# Patient Record
Sex: Male | Born: 1955
Health system: Southern US, Community
[De-identification: ages and names within clinical notes are randomized; demographics above are authoritative.]

## PROBLEM LIST (undated history)

## (undated) DIAGNOSIS — E1142 Type 2 diabetes mellitus with diabetic polyneuropathy: Secondary | ICD-10-CM

## (undated) DIAGNOSIS — K859 Acute pancreatitis without necrosis or infection, unspecified: Secondary | ICD-10-CM

## (undated) DIAGNOSIS — E119 Type 2 diabetes mellitus without complications: Secondary | ICD-10-CM

## (undated) DIAGNOSIS — M1990 Unspecified osteoarthritis, unspecified site: Secondary | ICD-10-CM

## (undated) DIAGNOSIS — F419 Anxiety disorder, unspecified: Secondary | ICD-10-CM

## (undated) DIAGNOSIS — Z8619 Personal history of other infectious and parasitic diseases: Secondary | ICD-10-CM

## (undated) DIAGNOSIS — R519 Headache, unspecified: Secondary | ICD-10-CM

## (undated) DIAGNOSIS — F319 Bipolar disorder, unspecified: Secondary | ICD-10-CM

## (undated) DIAGNOSIS — K219 Gastro-esophageal reflux disease without esophagitis: Secondary | ICD-10-CM

## (undated) DIAGNOSIS — R569 Unspecified convulsions: Secondary | ICD-10-CM

## (undated) DIAGNOSIS — F209 Schizophrenia, unspecified: Secondary | ICD-10-CM

## (undated) DIAGNOSIS — R51 Headache: Secondary | ICD-10-CM

## (undated) HISTORY — PX: KNEE ARTHROSCOPY: SHX127

## (undated) HISTORY — DX: Personal history of other infectious and parasitic diseases: Z86.19

---

## 2003-08-30 ENCOUNTER — Emergency Department (HOSPITAL_COMMUNITY): Admission: EM | Admit: 2003-08-30 | Discharge: 2003-08-30 | Payer: Self-pay | Admitting: Emergency Medicine

## 2004-04-21 ENCOUNTER — Emergency Department (HOSPITAL_COMMUNITY): Admission: EM | Admit: 2004-04-21 | Discharge: 2004-04-21 | Payer: Self-pay | Admitting: Emergency Medicine

## 2005-11-08 ENCOUNTER — Emergency Department (HOSPITAL_COMMUNITY): Admission: EM | Admit: 2005-11-08 | Discharge: 2005-11-08 | Payer: Self-pay | Admitting: Emergency Medicine

## 2008-04-20 ENCOUNTER — Emergency Department (HOSPITAL_COMMUNITY): Admission: EM | Admit: 2008-04-20 | Discharge: 2008-04-20 | Payer: Self-pay | Admitting: Emergency Medicine

## 2008-06-23 ENCOUNTER — Emergency Department (HOSPITAL_COMMUNITY): Admission: EM | Admit: 2008-06-23 | Discharge: 2008-06-23 | Payer: Self-pay | Admitting: Emergency Medicine

## 2008-07-08 ENCOUNTER — Emergency Department (HOSPITAL_COMMUNITY): Admission: EM | Admit: 2008-07-08 | Discharge: 2008-07-08 | Payer: Self-pay | Admitting: Emergency Medicine

## 2009-01-27 ENCOUNTER — Emergency Department (HOSPITAL_COMMUNITY): Admission: EM | Admit: 2009-01-27 | Discharge: 2009-01-27 | Payer: Self-pay | Admitting: Emergency Medicine

## 2009-02-10 ENCOUNTER — Emergency Department (HOSPITAL_COMMUNITY): Admission: EM | Admit: 2009-02-10 | Discharge: 2009-02-10 | Payer: Self-pay | Admitting: Emergency Medicine

## 2009-03-17 ENCOUNTER — Emergency Department (HOSPITAL_COMMUNITY): Admission: EM | Admit: 2009-03-17 | Discharge: 2009-03-17 | Payer: Self-pay | Admitting: Emergency Medicine

## 2010-04-10 LAB — URINALYSIS, ROUTINE W REFLEX MICROSCOPIC
Glucose, UA: NEGATIVE mg/dL
Glucose, UA: NEGATIVE mg/dL
Ketones, ur: 15 mg/dL — AB
Specific Gravity, Urine: 1.025 (ref 1.005–1.030)
Specific Gravity, Urine: 1.03 (ref 1.005–1.030)
pH: 6 (ref 5.0–8.0)
pH: 6 (ref 5.0–8.0)

## 2010-04-10 LAB — CBC
HCT: 45.3 % (ref 39.0–52.0)
Hemoglobin: 15.9 g/dL (ref 13.0–17.0)
MCHC: 35.1 g/dL (ref 30.0–36.0)
MCV: 95.9 fL (ref 78.0–100.0)
Platelets: 170 10*3/uL (ref 150–400)
RBC: 4.72 MIL/uL (ref 4.22–5.81)
RDW: 13.8 % (ref 11.5–15.5)

## 2010-04-10 LAB — LIPASE, BLOOD: Lipase: 488 U/L — ABNORMAL HIGH (ref 11–59)

## 2010-04-10 LAB — DIFFERENTIAL
Basophils Relative: 0 % (ref 0–1)
Eosinophils Relative: 3 % (ref 0–5)
Lymphs Abs: 1.4 10*3/uL (ref 0.7–4.0)
Monocytes Relative: 10 % (ref 3–12)
Neutro Abs: 6.3 10*3/uL (ref 1.7–7.7)
Neutrophils Relative %: 71 % (ref 43–77)

## 2010-04-10 LAB — COMPREHENSIVE METABOLIC PANEL
ALT: 64 U/L — ABNORMAL HIGH (ref 0–53)
AST: 62 U/L — ABNORMAL HIGH (ref 0–37)
Alkaline Phosphatase: 97 U/L (ref 39–117)
Creatinine, Ser: 0.89 mg/dL (ref 0.4–1.5)
GFR calc Af Amer: >60 mL/min (ref >60)
GFR calc non Af Amer: >60 mL/min (ref >60)
Potassium: 4.1 mEq/L (ref 3.5–5.1)

## 2010-04-10 LAB — URINE CULTURE
Colony Count: NO GROWTH
Culture: NO GROWTH

## 2010-04-10 LAB — URINE MICROSCOPIC-ADD ON

## 2010-04-13 LAB — CBC
MCHC: 33.6 g/dL (ref 30.0–36.0)
MCV: 95.6 fL (ref 78.0–100.0)
RBC: 5.1 MIL/uL (ref 4.22–5.81)
RDW: 14.7 % (ref 11.5–15.5)

## 2010-04-13 LAB — DIFFERENTIAL
Basophils Absolute: 0.1 10*3/uL (ref 0.0–0.1)
Basophils Relative: 1 % (ref 0–1)
Monocytes Relative: 9 % (ref 3–12)
Neutro Abs: 8.7 10*3/uL — ABNORMAL HIGH (ref 1.7–7.7)
Neutrophils Relative %: 75 % (ref 43–77)

## 2010-04-13 LAB — COMPREHENSIVE METABOLIC PANEL
ALT: 15 U/L (ref 0–53)
AST: 17 U/L (ref 0–37)
Chloride: 97 mEq/L (ref 96–112)
Creatinine, Ser: 1.19 mg/dL (ref 0.4–1.5)
Potassium: 4.6 mEq/L (ref 3.5–5.1)
Total Protein: 7.6 g/dL (ref 6.0–8.3)

## 2010-05-02 LAB — CBC
HCT: 47.5 % (ref 39.0–52.0)
HCT: 45.2 % (ref 39.0–52.0)
Hemoglobin: 15.8 g/dL (ref 13.0–17.0)
Hemoglobin: 15.3 g/dL (ref 13.0–17.0)
MCHC: 33.9 g/dL (ref 30.0–36.0)
MCV: 92.1 fL (ref 78.0–100.0)
RDW: 17.3 % — ABNORMAL HIGH (ref 11.5–15.5)
WBC: 12.4 10*3/uL — ABNORMAL HIGH (ref 4.0–10.5)

## 2010-05-02 LAB — DIFFERENTIAL
Basophils Absolute: 0.1 10*3/uL (ref 0.0–0.1)
Basophils Relative: 1 % (ref 0–1)
Eosinophils Absolute: 0.6 10*3/uL (ref 0.0–0.7)
Lymphocytes Relative: 9 % — ABNORMAL LOW (ref 12–46)
Lymphocytes Relative: 17 % (ref 12–46)
Lymphs Abs: 1.2 10*3/uL (ref 0.7–4.0)
Lymphs Abs: 1.9 10*3/uL (ref 0.7–4.0)
Monocytes Relative: 3 % (ref 3–12)
Neutro Abs: 10.1 10*3/uL — ABNORMAL HIGH (ref 1.7–7.7)
Neutro Abs: 8.2 10*3/uL — ABNORMAL HIGH (ref 1.7–7.7)
Neutrophils Relative %: 81 % — ABNORMAL HIGH (ref 43–77)
Neutrophils Relative %: 76 % (ref 43–77)

## 2010-05-02 LAB — URINE MICROSCOPIC-ADD ON

## 2010-05-02 LAB — URINALYSIS, ROUTINE W REFLEX MICROSCOPIC
Ketones, ur: 15 mg/dL — AB
Protein, ur: 30 mg/dL — AB
Specific Gravity, Urine: 1.026 (ref 1.005–1.030)

## 2010-05-02 LAB — BASIC METABOLIC PANEL
GFR calc Af Amer: >60 mL/min (ref >60)
GFR calc non Af Amer: >60 mL/min (ref >60)
Sodium: 140 mEq/L (ref 135–145)

## 2010-05-02 LAB — GC/CHLAMYDIA PROBE AMP, GENITAL: Chlamydia, DNA Probe: NEGATIVE

## 2010-05-02 LAB — COMPREHENSIVE METABOLIC PANEL
BUN: 8 mg/dL (ref 6–23)
Calcium: 9.3 mg/dL (ref 8.4–10.5)
Creatinine, Ser: 0.83 mg/dL (ref 0.4–1.5)
Glucose, Bld: 104 mg/dL — ABNORMAL HIGH (ref 70–99)
Sodium: 141 mEq/L (ref 135–145)
Total Protein: 6.7 g/dL (ref 6.0–8.3)

## 2010-05-02 LAB — URINE CULTURE: Colony Count: NO GROWTH

## 2010-11-13 ENCOUNTER — Emergency Department (HOSPITAL_COMMUNITY): Payer: Self-pay

## 2010-11-13 ENCOUNTER — Observation Stay (HOSPITAL_COMMUNITY): Payer: Self-pay

## 2010-11-13 ENCOUNTER — Inpatient Hospital Stay (HOSPITAL_COMMUNITY)
Admission: EM | Admit: 2010-11-13 | Discharge: 2010-11-15 | DRG: 440 | Disposition: A | Discharge status: Home / Self Care | Payer: Self-pay | Attending: Internal Medicine | Admitting: Internal Medicine

## 2010-11-13 ENCOUNTER — Encounter: Payer: Self-pay | Admitting: Internal Medicine

## 2010-11-13 ENCOUNTER — Encounter (HOSPITAL_COMMUNITY): Payer: Self-pay | Admitting: Radiology

## 2010-11-13 DIAGNOSIS — Z8249 Family history of ischemic heart disease and other diseases of the circulatory system: Secondary | ICD-10-CM

## 2010-11-13 DIAGNOSIS — F1010 Alcohol abuse, uncomplicated: Secondary | ICD-10-CM | POA: Diagnosis present

## 2010-11-13 DIAGNOSIS — K869 Disease of pancreas, unspecified: Secondary | ICD-10-CM

## 2010-11-13 DIAGNOSIS — E119 Type 2 diabetes mellitus without complications: Secondary | ICD-10-CM | POA: Diagnosis present

## 2010-11-13 DIAGNOSIS — K8689 Other specified diseases of pancreas: Principal | ICD-10-CM | POA: Diagnosis present

## 2010-11-13 DIAGNOSIS — F1210 Cannabis abuse, uncomplicated: Secondary | ICD-10-CM | POA: Diagnosis present

## 2010-11-13 DIAGNOSIS — I10 Essential (primary) hypertension: Secondary | ICD-10-CM | POA: Diagnosis present

## 2010-11-13 DIAGNOSIS — F17200 Nicotine dependence, unspecified, uncomplicated: Secondary | ICD-10-CM | POA: Diagnosis present

## 2010-11-13 HISTORY — DX: Acute pancreatitis without necrosis or infection, unspecified: K85.90

## 2010-11-13 LAB — GLUCOSE, CAPILLARY
Glucose-Capillary: 84 mg/dL (ref 70–99)
Glucose-Capillary: 122 mg/dL — ABNORMAL HIGH (ref 70–99)

## 2010-11-13 LAB — COMPREHENSIVE METABOLIC PANEL
ALT: 23 U/L (ref 0–53)
Alkaline Phosphatase: 111 U/L (ref 39–117)
BUN: 10 mg/dL (ref 6–23)
CO2: 24 mEq/L (ref 19–32)
GFR calc Af Amer: >90 mL/min (ref >90)
GFR calc non Af Amer: >90 mL/min (ref >90)
Glucose, Bld: 108 mg/dL — ABNORMAL HIGH (ref 70–99)
Potassium: 3.8 mEq/L (ref 3.5–5.1)
Total Protein: 6.9 g/dL (ref 6.0–8.3)

## 2010-11-13 LAB — URINALYSIS, ROUTINE W REFLEX MICROSCOPIC
Bilirubin Urine: NEGATIVE
Hgb urine dipstick: NEGATIVE
Ketones, ur: NEGATIVE mg/dL
Nitrite: NEGATIVE
Protein, ur: NEGATIVE mg/dL
Specific Gravity, Urine: 1.008 (ref 1.005–1.030)
Urobilinogen, UA: 2 mg/dL — ABNORMAL HIGH (ref 0.0–1.0)

## 2010-11-13 LAB — HEMOGLOBIN A1C
Hgb A1c MFr Bld: 6.7 % — ABNORMAL HIGH (ref <5.7)
Mean Plasma Glucose: 146 mg/dL — ABNORMAL HIGH (ref <117)

## 2010-11-13 LAB — RAPID URINE DRUG SCREEN, HOSP PERFORMED
Barbiturates: NOT DETECTED
Cocaine: NOT DETECTED
Tetrahydrocannabinol: POSITIVE — AB

## 2010-11-13 LAB — CBC
HCT: 43.9 % (ref 39.0–52.0)
Hemoglobin: 15.8 g/dL (ref 13.0–17.0)
MCH: 33 pg (ref 26.0–34.0)
MCHC: 36 g/dL (ref 30.0–36.0)
RBC: 4.79 MIL/uL (ref 4.22–5.81)

## 2010-11-13 LAB — DIFFERENTIAL
Basophils Absolute: 0 10*3/uL (ref 0.0–0.1)
Lymphocytes Relative: 28 % (ref 12–46)
Monocytes Absolute: 0.8 10*3/uL (ref 0.1–1.0)
Monocytes Relative: 8 % (ref 3–12)
Neutro Abs: 5.8 10*3/uL (ref 1.7–7.7)
Neutrophils Relative %: 62 % (ref 43–77)

## 2010-11-13 LAB — LIPASE, BLOOD: Lipase: 42 U/L (ref 11–59)

## 2010-11-13 MED ORDER — GADOBENATE DIMEGLUMINE 529 MG/ML IV SOLN
20.0000 mL | Freq: Once | INTRAVENOUS | Status: AC | PRN
  Administered 2010-11-13: 20 mL via INTRAVENOUS

## 2010-11-13 MED ORDER — IOHEXOL 300 MG/ML  SOLN
100.0000 mL | Freq: Once | INTRAMUSCULAR | Status: AC | PRN
  Administered 2010-11-13: 100 mL via INTRAVENOUS

## 2010-11-13 NOTE — H&P (Signed)
Hospital Admission Note Date: 11/13/2010  Patient name: Michael [name] Medical record number: [id number] Date of birth: [birth year] Age: 56 y.o. Gender: male PCP: No primary care physician  Medical Service: General Medicine Teaching Service B2  Attending physician:   Dr. Doneen Poisson  1st Contact:    Dr. Yaakov Guthrie  Pager: [phone] 2nd Contact:    Dr. Dorthula Rue  Pager: [phone] After 5 pm or weekends: 1st Contact:      Pager: [phone] 2nd Contact:      Pager: [phone]  Chief Complaint: Abdominal Pain  History of Present Illness: Michael [name] is a 55 year old gentleman with a history of long-term heavy alcohol use, smoking, and recurrent pancreatitis who presents with abdominal pain starting yesterday.  He describes the pain as a dull ache in his left upper quadrant, and it is currently almost gone.  At its worst, the pain was 5/10 severity.  His "stomach has felt funny, bubbly" for a few days before this pain started.  He has a history of heavy alcohol use.  He had quit drinking for some time before restarting drinking heavily on his birthday on [birth year].  No change in appetite.  No nausea or vomiting since the pain started, but he did vomit 4-5 days ago one time when he drank a lot of alcohol.  No constipation or diarrhea.  No melena or red blood in stools.  No fevers or chills.  He is having frequent night sweats.  He reports a history of multiple episodes of pancreatitis.  His last documented episode was in January 2011.  He reports weight loss between 2010 and 2011 of 25-30 lbs, but he believes he has gained all of this weight back now.   Mr. Friberg reports being homeless for more than a year.  He goes between shelters and has stayed with his niece as well.  He has smoked 0.5-1 ppd cigarettes for 35 years. He admits to smoking marijuana occasionally but no other illicit drug use.  He has some numbness and tingling in his fingers on occasion at baseline.  He reports increased urinary urgency  at baseline, with some episodes of incontinence when he cannot get to a bathroom quickly.  No hematuria or dysuria.  No CP, SOB, headache, or rashes.     Meds: No Home Medications  Allergies: NKDA  Past Medical History  Diagnosis Date  . Diabetes mellitus: per medical record but patient denies.  Does not take any medications or insulin.     . Pancreatitis: Recurrent, last documented episode January 2011    . Smoker: 0.5-1 ppd x 37 years.   Hypertension: Patient reports HTN in the past, on no medications currently Urinary Tract Infection Heavy Alcohol Use for most of his life.  Drinks 2-3 beers plus additional liquor daily currently.  Had stopped drinking for some time but restarted on his birthday earlier this month.    No surgeries   Family History: Mother: Stroke, Cancer Father: Kidney Failure Sister: Diabetes, Hypertension   Social History: Homeless for more than a year.  Goes between shelters.  Recently was living with his niece but not currently.  Had been working at [employer] until quitting a couple weeks ago after an argument with a Radio broadcast assistant.   Heavy alcohol use for most of his life.  Drinks 2-3 beers plus additional liquor daily currently.  Had stopped drinking for some time but restarted on his birthday earlier this month.   Smokes 0.5-1 ppd x 37 years. Occasional  marijuana use, no other illicit drug use. At baseline can walk indefinitely without getting tired.   Review of Systems: See HPI  Physical Exam: Vitals: T 97.9, BP 146/96 -> 129/66, P 100, RR 20, SiO2 98% RA  General: alert, well-developed, and cooperative to examination.  Head: normocephalic and atraumatic.  Eyes: vision grossly intact, pupils equal, pupils round, pupils reactive to light.  Exotropia present (patient reports he has had this his whole life, no double vision in any direction of gaze), mild scleral injection (patient was just crying), anicteric. Mouth: pharynx pink and moist, no erythema,  and no exudates.  Very poor dentition. Neck: supple, full ROM, no thyromegaly, no JVD, and no carotid bruits.  Lungs: normal respiratory effort, no accessory muscle use, normal breath sounds, no crackles, and no wheezes. Heart: normal rate, regular rhythm, no murmur, no gallop, and no rub.   Abdomen: Mildly tender to deep palpation over epigastric area and LUQ.  All other parts of the abdomen non-tender to light and deep palpation.  Soft, non-distended.  Hyperactive bowel sounds.  No guarding, no rebound tenderness, no hepatomegaly.  He points to an area above his LUQ, over his lower ribs, that he says has been tender at times over the past couple days as well.    Msk: no joint swelling, no joint warmth, and no redness over joints.  Pulses: 2+ DP/PT pulses bilaterally Extremities: No cyanosis, clubbing, edema.  L 5th finger tip s/p traumatic amputation as child, nail present.  Neurologic: alert & oriented X3, cranial nerves II-XII intact, strength normal in all extremities, sensation intact to light touch. Skin: turgor normal and no rashes.    Lab results: Basic Metabolic Panel:  Basename 11/13/10 0128  NA 138  K 3.8  CL 102  CO2 24  GLUCOSE 108*  BUN 10  CREATININE 0.67  CALCIUM 8.6  MG --  PHOS --    Liver Function Tests:  Basename 11/13/10 0128  AST 28  ALT 23  ALKPHOS 111  BILITOT 0.6  PROT 6.9  ALBUMIN 3.6     Basename 11/13/10 0128  LIPASE 42  AMYLASE --   CBC:  Basename 11/13/10 0128  WBC 9.4  NEUTROABS 5.8  HGB 15.8  HCT 43.9  MCV 91.6  PLT 162    Urinalysis:  Result Name                               Result     Abnl   Normal Range     Units      Perf. Loc.  Color, Urine                              YELLOW            YELLOW  Appearance                                CLOUDY     a      CLEAR  Specific Gravity                          1.008             1.005-1.030  pH  5.5               5.0-8.0  Urine Glucose                              NEGATIVE          NEG              mg/dL  Bilirubin                                  NEGATIVE          NEG  Ketones                                   NEGATIVE          NEG              mg/dL  Blood                                      NEGATIVE          NEG  Protein                                    NEGATIVE          NEG              mg/dL  Urobilinogen                              2.0        h      0.0-1.0          mg/dL  Nitrite                                    NEGATIVE          NEG  Leukocytes                                NEGATIVE          NEG    MICROSCOPIC NOT DONE ON URINES WITH NEGATIVE PROTEIN, BLOOD, LEUKOCYTES,    NITRITE, OR GLUCOSE <1000 mg/dL.    Imaging results:  Ct Abdomen Pelvis W Contrast  11/13/2010  *RADIOLOGY REPORT*  Clinical Data: Severe lower abdominal pain, nausea and vomiting.  CT ABDOMEN AND PELVIS WITH CONTRAST  Technique:  Multidetector CT imaging of the abdomen and pelvis was performed following the standard protocol during bolus administration of intravenous contrast.  Contrast: OMNIPAQUE IOHEXOL 300 MG/ML IV SOLN  Comparison: Abdominal ultrasound performed 03/17/2009  Findings: The visualized lung bases are clear.  The liver and spleen are unremarkable in appearance.  The gallbladder is mildly distended but otherwise within normal limits.  At the tail of the pancreas, note is made of a vague hypodense lesion measuring approximately 2.3 cm in size.  MRCP is recommended for further evaluation, to exclude malignancy.  Mild prominence of the adrenal glands bilaterally may  reflect mild adrenal hyperplasia.  The kidneys are unremarkable in appearance.  There is no evidence of hydronephrosis.  No renal or ureteral stones are seen.  No perinephric stranding is appreciated.  No free fluid is identified.  The small bowel is unremarkable in appearance.  The stomach is within normal limits.  No acute vascular abnormalities are seen. Calcification  is noted along the distal abdominal aorta and its branches.  The appendix is normal in caliber and contains air, without evidence for appendicitis.  The colon is largely decompressed and is unremarkable in appearance.  The bladder is mildly distended; mild anterior bladder wall thickening is nonspecific and relatively symmetric, though malignancy cannot be entirely excluded.  Suggest clinical correlation.  A tiny left inguinal hernia is noted, containing only fat.  Minimal calcification is noted within the prostate.  No inguinal lymphadenopathy is seen.  No acute osseous abnormalities are identified.  IMPRESSION:  1.  2.3 cm vague hypodense lesion noted at the tail of the pancreas.  MRCP is recommended for further evaluation, to exclude malignancy.  2.  Mild bilateral adrenal gland prominence may reflect mild adrenal hyperplasia. 3.  Tiny left inguinal hernia, containing only fat. 4.  Mild anterior bladder wall thickening is nonspecific and relatively symmetric, though malignancy cannot be entirely excluded.  Suggest clinical correlation. 5.  Scattered calcification along the distal abdominal aorta and its branches.  Original Report Authenticated By: Tonia Ghent, M.D.    Assessment & Plan by Problem:  Mr. Hence is a 55 year old gentleman with a history of long-term heavy alcohol use, smoking, and recurrent pancreatitis who restarted heavy drinking 2-3 weeks ago and now presents with abdominal pain for one day.  Abdominal Pain: The most likely etiology of his abdominal pain is gastritis, likely due to his relapse into heavy alcohol use.  GERD and PUD would also be on the differential.  He gives a history of additional pain located slightly higher, over his ribs, with associated tenderness, that may have been musculoskeletal.  Normal lipase makes this less likely to be another episode of pancreatitis.  A left lower lobe pneumonia is another possibility.  Finally, the small 2.3 cm hypodense lesion in the tail  of the pancreas seen on CT makes pancreatic neoplasm another possible etiology.    Admit for observation Zofran PRN nausea, Morphine PRN pain for symptomatic relief MRCP to further evaluate pancreatic lesion CXRay Protonix NS @ 125 cc/hr, NPO for now AM Labs: CBC and CMET   Pancreatic Mass: 2.3cm hypodense lesion in tail of the pancreas seen on CT.  Highest on differential for this are pseudocyst (given history of recurrent pancreatitis) vs. neoplasm.  MRCP ordered for further evaluation  Possible h/o DM: Patient denies history of diabetes or insulin use at home, but EPIC EMR has diabetes listed in his history.  Cannot find any further records of this.  Glucose 108 on CMET.  Will check HbA1c  Possible HTN: Patient reports hypertension in the past.  BP a little elevated on arrival, but normalized in ED without any medication for pressure.  Denies taking any medications at home.  Will continue to monitor.  Tobacco Use: Tobacco Cessation consult  Heavy Alcohol Use: CIWA withdrawal protocol  Marijuana Use: Will check UDS  No Outpatient Care:  Will arrange for follow-up with a primary care physician on discharge.  DVT Proph: Lovenox    R2/3______________________________      R1________________________________  ATTENDING: I performed and/or observed a history and physical examination of  the patient.  I discussed the case with the residents as noted and reviewed the residents' notes.  I agree with the findings and plan--please refer to the attending physician note for more details.  Signature________________________________  Printed Name_____________________________

## 2010-11-14 DIAGNOSIS — R109 Unspecified abdominal pain: Secondary | ICD-10-CM

## 2010-11-14 LAB — COMPREHENSIVE METABOLIC PANEL
ALT: 17 U/L (ref 0–53)
AST: 21 U/L (ref 0–37)
CO2: 28 mEq/L (ref 19–32)
Chloride: 105 mEq/L (ref 96–112)
GFR calc non Af Amer: >90 mL/min (ref >90)
Potassium: 3.7 mEq/L (ref 3.5–5.1)
Total Bilirubin: 0.8 mg/dL (ref 0.3–1.2)

## 2010-11-14 LAB — CBC
Hemoglobin: 14.3 g/dL (ref 13.0–17.0)
MCH: 32.5 pg (ref 26.0–34.0)
MCHC: 34.8 g/dL (ref 30.0–36.0)
Platelets: 139 10*3/uL — ABNORMAL LOW (ref 150–400)

## 2010-11-14 LAB — GLUCOSE, CAPILLARY: Glucose-Capillary: 115 mg/dL — ABNORMAL HIGH (ref 70–99)

## 2010-11-26 NOTE — Discharge Summary (Signed)
NAMETARRANCE, Michael [name]                ACCOUNT NO.:  [id number]  MEDICAL RECORD NO.:  [id number]  LOCATION:                                 FACILITY:  PHYSICIAN:  Doneen Poisson, MD     DATE OF BIRTH:  01-07-1956  DATE OF ADMISSION:  11/13/2010 DATE OF DISCHARGE:  11/15/2010                              DISCHARGE SUMMARY   DISCHARGE DIAGNOSES: 1. Abdominal pain:  Muscle skeletal versus gastritis. 2. Pancreatic lesion. 3. Hypertension. 4. Diabetes mellitus. 5. History of heavy alcohol use. 6. History of recurrent pancreatitis. 7. Tobacco use. 8. Marijuana use. 9. Urinary urgency. 10.Possible hepatic steatosis: on CT, possibly related to heavy alcohol use.  DISCHARGE MEDICATIONS: 1. Folic acid 1 mg by mouth daily. 2. Omeprazole 20 mg by mouth daily. 3. Thiamien 100 mg by mouth daily.  DISPOSITION:  Michael [name] is to follow up with the Internal Medicine Outpatient Clinic at [facility] on November 28, 2010.  He currently does not have an outpatient doctor, so this will be a visit to establish care.  He was found to be hypertensive during this hospitalization and gives a history of more severe hypertension in the past.  No antihypertensive medications were started during this hospitalization, as his pressures were not that high, and it was uncertain whether the higher blood pressures we observed were  partially due to pain.  His blood pressure should be addressed at  follo-wup.  He was also found to have diabetes mellitus with a  hemoglobin A1c of 6.7%.  We discussed the importance of diet and this should be reiterated on follow-up.  In addition, he was found to have a 2.3-cm hypodense lesion in the tail of the pancreas on CT scan, and an MRCP was ordered, which characterized the lesion  as a likely dilated pancreatic duct with the cause of dilation uncertain.   Most likely, this is a benign stricture from his history of recurrent  pancreatitis, but an early neoplasm  cannot be ruled out.  It was felt  that he would best benefit from an endoscopic ultrasound with possible biopsy of this lesion, but the gastroenterologist, Dr. Dulce Sellar, who specializes in this procedure was out of town during Michael [name] hospitalization.  As a result, an appointment was scheduled with Dr. Dulce Sellar on December 05, 2010.  However, due to his lack of insurance,  he has to call and discuss his financial situation with the office or  else they will cancel this appointment.  It was felt that he would  likely qualify for the orange card to the [facility] through the outpatient clinic.  It is likely he will have to  postpone his appointment with Gastroenterology for short time until  he can get this assistance through the outpatient clinic.  CONSULTATIONS:  None.  PROCEDURES: 1. CT abdomen and pelvis with contrast, November 13, 2010:  2.3-cm     vague hypodense lesion noted in the tail of the pancreas.  Mild     bilateral adrenal gland prominence may reflect mild adrenal     hyperplasia.  Tiny left inguinal hernia containing only fat.  Mild  anterior bladder wall thickening is nonspecific and relatively     symmetric, though malignancy cannot be entirely excluded     suggest clinical correlation.  Scattered calcifications along the     distal abdominal aorta and its branches. 2. MRCP with and without contrast, November 13, 2010:  The area of     hypoattenuation within the pancreatic tail described on CT     corresponds to an area of focal pancreatic ductal dilation within     the tail with abrupt transition to normal-caliber duct within the     junction of the body and tail.  Given history of pancreatitis, this     could represent a benign stricture.  However, a non-border-     deforming early/subtle adenocarcinoma could have a similar     appearance.  No evidence of metastatic disease or acute process in     the abdomen.  Probable focal steatosis  adjacent to gallbladder     within the liver. 3. Chest x-ray, 2 view, November 13, 2010:  Normal chest x-ray.  ADMISSION HISTORY AND PHYSICAL:  Michael [name] is a 55 year old gentleman with a history of long-term heavy alcohol use, smoking, and recurrent pancreatitis who presents with abdominal pain starting yesterday.  He describes the pain as a dull ache in his left upper quadrant and is currently almost gone.  At its worst, the pain was 5/10 severity.  The stomach has felt funny, bubbly for a few days before this pain started. He has a history of heavy alcohol use.  He quit drinking for some time before restarting drinking heavily on his birthday on November 01, 2008. No change in appetite.  No nausea or vomiting since the pain started, but he did vomit 4-5 days ago 1 time when he drank a lot of alcohol.  No constipation or diarrhea.  No melena or red blood in stools.  No fevers or chills.  He is having frequent night sweats.  He reports a history of multiple episodes of pancreatitis.  His last documented episode was in January 2011.  He reports weight loss between 2010-2011 of 25-30 pounds, but he believes he has gained all this weight back now.  Michael [name] reports being homeless for more than a year.  He goes between shelters and staying with his niece.  He smoked between 1/2 to 1 pack of cigarettes per day for 35 years.  He also admits to smoking marijuana occasionally, but denies any other illicit drug use.  He has some numbness and tingling in his fingers on occasion at baseline.  He reports increased urinary urgency at baseline with some episodes of incontinence when he cannot get to the bathroom quickly.  No hematuria or dysuria.  No chest pain, shortness of breath, headache or rashes.  ADMISSION PHYSICAL EXAMINATION: VITAL SIGNS:  Temperature 97.9, blood pressure 146/96 to 129/66, pulse  100, respirations 20, O2 saturation 98% on room air. GENERAL:  Alert, well developed, and  cooperative to exam. HEAD:  Normocephalic, atraumatic. EYES:  Vision grossly intact.  Pupils equally round, reactive to light. Exotropia present, which he reports he has had his whole life. Denies double vision in any direction of gaze.  Mild scleral injection (he was just crying).  Anicteric. MOUTH:  Pharynx pink and moist.  No erythema.  No exudates.  Very poor dentition. NECK:  Supple.  Full range of motion.  No thyromegaly, no JVD, and no carotid bruits. LUNGS:  Normal respiratory effort.  No accessory muscle use.  Normal breath sounds.  No crackles and no wheezes. HEART:  Normal rate, regular rhythm with no murmurs, rubs, or gallops. ABDOMEN:  Mildly tender to deep palpation over epigastric area and left upper quadrant.  All other parts of the abdomen nontender to light and deep palpation.  Soft, nondistended.  Hyperactive bowel sounds.  No guarding, no rebound tenderness, and no hepatomegaly.  He points to an area above his left upper quadrant over his lower ribs that has been tender in recent days, but is not currently. MSK:  No joint swelling, warmth, or redness. PULSES:  2+ dorsalis pedis and posterior tibial pulses bilaterally. EXTREMITIES:  No cyanosis, clubbing, or edema.  Left fifth finger tip status post traumatic amputation as a child, nail present. NEUROLOGIC:  Alert and oriented x 3.  Cranial nerves II through XII intact.  Strength normal in all extremities.  Sensation intact to light touch. SKIN:  Turgor normal and no rashes.  ADMISSION LABORATORY RESULTS:  BMET:  Sodium 138, potassium 3.8, chloride 102, CO2 24, glucose 108, BUN 10, creatinine 0.67, calcium 8.6.  Liver function tests:  AST 28, ALT 23, alkaline phosphatase 111, total bilirubin 0.6, protein 6.9, albumin 3.6.  Lipase 42.  CBC:  White blood cell count 9.4, absolute neutrophils 5.8, hemoglobin 15.8, hematocrit 43.9, MCV 91.6, platelets 162.  Urinalysis:  Negative.  HOSPITAL COURSE BY  PROBLEM: 1. Abdominal pain.  The most likely etiology of the abdominal pain was     thought to be gastritis, likely due to his recent relapse and heavy     alcohol use.  He did report increased pain after drinking     a lot of liquor.  Gastroesophageal reflux disease as well as peptic     ulcer disease were also on the differential, but less likely.     It is also feasible that the small pancreatic finding on imaging     could be the cause, but it is more likely that this is due to     gastritis from his recent relapse and alcohol use.  Given the normal     lipase level, this is unlikely to be another episode of     pancreatitis.  Normal chest x-ray makes left lower lobe pneumonia     very unlikely.  His abdominal pain resolved very quickly     during the hospitalization.  He was offered Zofran and     morphine as needed during hospitalization, but did not require any     of these medications from after the time of admission through     discharge.  He was started on Protonix on admission  and the      abdominal pain quickly resolved.  2. Pancreatic mass:  2.3 cm hypodense lesion in the tail of pancreas     seen on CT.  On follow-up MRCP, this was found to be a dilated duct     with the cause being most likely a benign  stricture given his      history of recurrent pancreatitis, but the possibility of an early      developing pancreatic neoplasm could not be ruled out.  A CA-19-9     antigen was checked which was 36.8, borderline elevated.  It was      felt that this finding made an early developing neoplasm less likely.     It was felt that the most appropriate next test would be a GI      consultation for  possible endoscopic ultrasound with ultrasound-guided      biopsy of the mass.  However, the GI specialist in town who does this      procedure, Dr. Dulce Sellar, was out of town during Michael [name]      hospitalization.  As a result, a follow-up appointment was made with      Dr. Dulce Sellar for  Michael [name] to be evaluated, and possibly have this     procedure done, as an outpatient.  3. Hypertension:  Michael [name] blood pressure on admission was     146/96, and blood pressures during hospitalization ranged between     121 and 145 systolic over 76-84 diastolic.  Given the mildness of     these blood pressures, no antihypertensive medications were started     during this hospitalization.  He reports a history of     higher blood pressures in the past.  His blood pressure should be     addressed on follow-up with the outpatient clinic.  4. Diabetes mellitus:  Michael [name] hemoglobin A1c was found to be     6.7% during this hospitalization.  This qualifies him for a diagnosis     of diabetes mellitus.  At this level, pharmacotherapy would not     be indicated.  We stressed the importance of good diet and this      should be further discussed on follow-up in the outpatient clinic.  5. Heavy alcohol use.  Michael [name] has a history of heavy alcohol use     and has recently relapsed into heavy alcohol use after managing to     stop for some time.  This is the likely cause of his history of      recurrent pancreatitis.  It also has caused him to have a      significant loss of social function.  He reported to [place] that     he has had recently lost his job due to his relapse into     alcoholism, and he knows that his heavy alcohol use is the cause of     his homelessness.  The social worker met with him during this     hospitalization to discuss alcohol cessation, and this should be     readdressed at follow-up.  6. Tobacco use:  The social worker met with him during the      hospitalization to discuss his tobacco use and options to quit.     He expressed a desire to quit.  His progress should be reassessed      at follow-up.  DISCHARGE VITAL SIGNS:  Temperature 98.3, pulse 78, respirations 16, blood pressure 130/87, O2 saturation 97% on room air.  DISCHARGE LABORATORY RESULTS:  Cancer  antigen 19/9 36.8.  CBC:  White blood cell count 7.4, hemoglobin 14.3, hematocrit 41.1,  platelets 139.  Comprehensive metabolic panel:  Sodium 141, potassium 3.7, chloride 105, bicarbonate 28, glucose 103, BUN 17, creatinine 0.99.  Total bilirubin 0.8, alkaline phosphatase 100, AST 21, ALT 17, total protein 5.9, albumin 2.9, calcium 8.6.  Hemoglobin A1c 6.7%.  Urine drug screen positive for marijuana (he admitted to marijuana use  before we had ordered this drug screen).   ______________________________ Blanca Friend, MD   ______________________________ Doneen Poisson, MD   BW/MEDQ  D:  11/19/2010  T:  11/20/2010  Job:  [id number]  Electronically Signed by Blanca Friend MD on 11/20/2010 10:07:33 PM Electronically Signed by Doneen Poisson  on 11/26/2010 05:23:09 PM

## 2010-11-28 ENCOUNTER — Encounter (HOSPITAL_COMMUNITY): Payer: Self-pay | Admitting: *Deleted

## 2010-11-28 ENCOUNTER — Ambulatory Visit: Payer: Self-pay | Admitting: Internal Medicine

## 2010-11-29 ENCOUNTER — Ambulatory Visit: Payer: Self-pay | Admitting: Internal Medicine

## 2010-11-30 ENCOUNTER — Ambulatory Visit (HOSPITAL_COMMUNITY): Admit: 2010-11-30 | Payer: Self-pay | Admitting: Gastroenterology

## 2010-11-30 ENCOUNTER — Encounter (HOSPITAL_COMMUNITY): Payer: Self-pay

## 2010-11-30 SURGERY — ESOPHAGEAL ENDOSCOPIC ULTRASOUND (EUS) RADIAL
Anesthesia: Monitor Anesthesia Care

## 2010-12-01 ENCOUNTER — Encounter: Payer: Self-pay | Admitting: Internal Medicine

## 2010-12-02 ENCOUNTER — Emergency Department (HOSPITAL_COMMUNITY)
Admission: EM | Admit: 2010-12-02 | Discharge: 2010-12-02 | Disposition: A | Discharge status: Home / Self Care | Payer: Self-pay | Attending: Emergency Medicine | Admitting: Emergency Medicine

## 2010-12-02 ENCOUNTER — Encounter (HOSPITAL_COMMUNITY): Payer: Self-pay | Admitting: *Deleted

## 2010-12-02 DIAGNOSIS — E119 Type 2 diabetes mellitus without complications: Secondary | ICD-10-CM | POA: Insufficient documentation

## 2010-12-02 DIAGNOSIS — F17200 Nicotine dependence, unspecified, uncomplicated: Secondary | ICD-10-CM | POA: Insufficient documentation

## 2010-12-02 DIAGNOSIS — R10817 Generalized abdominal tenderness: Secondary | ICD-10-CM | POA: Insufficient documentation

## 2010-12-02 DIAGNOSIS — I10 Essential (primary) hypertension: Secondary | ICD-10-CM | POA: Insufficient documentation

## 2010-12-02 DIAGNOSIS — R109 Unspecified abdominal pain: Secondary | ICD-10-CM | POA: Insufficient documentation

## 2010-12-02 DIAGNOSIS — R112 Nausea with vomiting, unspecified: Secondary | ICD-10-CM | POA: Insufficient documentation

## 2010-12-02 DIAGNOSIS — R000 Tachycardia, unspecified: Secondary | ICD-10-CM | POA: Insufficient documentation

## 2010-12-02 DIAGNOSIS — K859 Acute pancreatitis without necrosis or infection, unspecified: Secondary | ICD-10-CM | POA: Insufficient documentation

## 2010-12-02 LAB — COMPREHENSIVE METABOLIC PANEL
ALT: 38 U/L (ref 0–53)
AST: 35 U/L (ref 0–37)
Albumin: 3.5 g/dL (ref 3.5–5.2)
Alkaline Phosphatase: 125 U/L — ABNORMAL HIGH (ref 39–117)
BUN: 9 mg/dL (ref 6–23)
CO2: 26 mEq/L (ref 19–32)
Calcium: 9.5 mg/dL (ref 8.4–10.5)
Chloride: 98 mEq/L (ref 96–112)
Creatinine, Ser: 0.78 mg/dL (ref 0.50–1.35)
GFR calc Af Amer: >90 mL/min (ref >90)
GFR calc non Af Amer: >90 mL/min (ref >90)
Glucose, Bld: 98 mg/dL (ref 70–99)
Potassium: 4.1 mEq/L (ref 3.5–5.1)
Sodium: 140 mEq/L (ref 135–145)
Total Bilirubin: 0.9 mg/dL (ref 0.3–1.2)
Total Protein: 7.9 g/dL (ref 6.0–8.3)

## 2010-12-02 LAB — CBC
HCT: 48.9 % (ref 39.0–52.0)
Hemoglobin: 17.3 g/dL — ABNORMAL HIGH (ref 13.0–17.0)
MCH: 32.9 pg (ref 26.0–34.0)
MCHC: 35.4 g/dL (ref 30.0–36.0)
MCV: 93 fL (ref 78.0–100.0)
Platelets: 141 10*3/uL — ABNORMAL LOW (ref 150–400)
RBC: 5.26 MIL/uL (ref 4.22–5.81)
RDW: 13.6 % (ref 11.5–15.5)
WBC: 10.2 10*3/uL (ref 4.0–10.5)

## 2010-12-02 LAB — URINE MICROSCOPIC-ADD ON

## 2010-12-02 LAB — DIFFERENTIAL
Basophils Relative: 0 % (ref 0–1)
Eosinophils Absolute: 0 10*3/uL (ref 0.0–0.7)
Eosinophils Relative: 0 % (ref 0–5)
Monocytes Relative: 9 % (ref 3–12)
Neutrophils Relative %: 79 % — ABNORMAL HIGH (ref 43–77)

## 2010-12-02 LAB — URINALYSIS, ROUTINE W REFLEX MICROSCOPIC
Leukocytes, UA: NEGATIVE
Nitrite: NEGATIVE
Specific Gravity, Urine: 1.011 (ref 1.005–1.030)
Urobilinogen, UA: 1 mg/dL (ref 0.0–1.0)
pH: 6 (ref 5.0–8.0)

## 2010-12-02 LAB — LIPASE, BLOOD: Lipase: 141 U/L — ABNORMAL HIGH (ref 11–59)

## 2010-12-02 MED ORDER — HYDROCODONE-ACETAMINOPHEN 5-325 MG PO TABS: ORAL_TABLET | ORAL | Status: DC

## 2010-12-02 MED ORDER — ONDANSETRON HCL 4 MG PO TABS: 4.0000 mg | ORAL_TABLET | Freq: Four times a day (QID) | ORAL | Status: DC

## 2010-12-02 MED ORDER — ONDANSETRON HCL 4 MG/2ML IJ SOLN
4.0000 mg | Freq: Once | INTRAMUSCULAR | Status: AC
  Administered 2010-12-02: 4 mg via INTRAVENOUS
  Filled 2010-12-02: qty 2

## 2010-12-02 MED ORDER — MORPHINE SULFATE 4 MG/ML IJ SOLN
4.0000 mg | Freq: Once | INTRAMUSCULAR | Status: AC
  Administered 2010-12-02: 4 mg via INTRAVENOUS
  Filled 2010-12-02: qty 1

## 2010-12-02 NOTE — ED Notes (Signed)
Iv team here 

## 2010-12-02 NOTE — ED Notes (Signed)
The pt has had abd pain for 2 weeks intermittently.  History of pancreatitis.

## 2010-12-02 NOTE — ED Provider Notes (Signed)
History     CSN: [id number] Arrival date & time: 12/02/2010  1:15 PM   None     Chief Complaint  Patient presents with  . Abdominal Pain  . Nausea    (Consider location/radiation/quality/duration/timing/severity/associated sxs/prior treatment) HPI  Patient presents to emergency department complaining a waxing and waning diffuse abdominal pain stating that since being admitted to the hospital at the end of October with diagnosis of pancreatitis that he has had on and off abdominal pain with associated nausea and a couple episodes of vomiting. Patient has history of recurrent pancreatitis and history of alcohol abuse. Patient states since discharge from the hospital he has had only 2 beers. Patient states that the nausea and episodes of vomiting followed to 2 beers. Patient was set up to see outpatient clinic and Dr. Dulce Sellar for further evaluation and management of a pancreatic lesion however he did not follow up with outpatient clinics and has the appointment with Dr. Dulce Sellar in 3 days. Patient denies changing in the abdominal pain but states persistent ongoing pain. Patient states the pain is similar to the pain that he's had since admission. Patient denies known fevers. Denies alleviating factors. Has not taken anything for pain prior to arrival. Denies radiation of pain.   Past Medical History  Diagnosis Date  . Pancreatitis   . Smoker   . Diabetes mellitus     diet controlled    Past Surgical History  Procedure Date  . No past surgeries     No family history on file.  History  Substance Use Topics  . Smoking status: Current Everyday Smoker  . Smokeless tobacco: Not on file  . Alcohol Use: Yes      Review of Systems  All other systems reviewed and are negative.    Allergies  Review of patient's allergies indicates no known allergies.  Home Medications   Current Outpatient Rx  Name Route Sig Dispense Refill  . IBUPROFEN 400 MG PO TABS Oral Take 800 mg by mouth  every 4 (four) hours as needed.        BP 158/96  Pulse 109  Temp(Src) 99.3 F (37.4 C) (Oral)  Resp 22  Ht 5\' 5"  (1.651 m)  Wt 200 lb (90.719 kg)  BMI 33.28 kg/m2  SpO2 98%  Physical Exam  Nursing note and vitals reviewed. Constitutional: He is oriented to person, place, and time. He appears well-developed and well-nourished. No distress.  HENT:  Head: Normocephalic and atraumatic.  Eyes: Conjunctivae and EOM are normal. Pupils are equal, round, and reactive to light.  Neck: Normal range of motion. Neck supple.  Cardiovascular: Regular rhythm, S1 normal, S2 normal, normal heart sounds and intact distal pulses.  Tachycardia present.  Exam reveals no gallop and no friction rub.   No murmur heard. Pulmonary/Chest: Effort normal and breath sounds normal. No respiratory distress. He has no wheezes. He has no rales. He exhibits no tenderness.  Abdominal: Soft. Normal aorta and bowel sounds are normal. He exhibits no distension and no mass. There is generalized tenderness. There is no rebound, no guarding and no CVA tenderness.  Musculoskeletal: Normal range of motion. He exhibits no edema and no tenderness.  Neurological: He is alert and oriented to person, place, and time.  Skin: Skin is warm and dry. No rash noted. He is not diaphoretic. No erythema.  Psychiatric: He has a normal mood and affect.    ED Course  Procedures (including critical care time)  Labs Reviewed  CBC - Abnormal;  Notable for the following:    Hemoglobin 17.3 (*)    Platelets 141 (*)    All other components within normal limits  DIFFERENTIAL - Abnormal; Notable for the following:    Neutrophils Relative 79 (*)    Neutro Abs 8.0 (*)    All other components within normal limits  COMPREHENSIVE METABOLIC PANEL - Abnormal; Notable for the following:    Alkaline Phosphatase 125 (*)    All other components within normal limits  LIPASE, BLOOD - Abnormal; Notable for the following:    Lipase 141 (*)    All other  components within normal limits  URINALYSIS, ROUTINE W REFLEX MICROSCOPIC   No results found.   No diagnosis found.  7:37 PM I spoke with Dr. Anselm Jungling with [facility] who states she will be happy to get a rescheduled appointment for Mr. Wempe for Monday stating that the office will call him to give him appointment time.    MDM  Patient states his pain is well controlled at this time. Patient is afebrile and nontoxic-appearing. The mild increase in his lipase patient's pain is well-controlled and has not 3 times the upper range. Patient is very agreeable to following up with outpatient clinics on Monday however returning to emergency department for changing or worsening symptoms. Patient is tolerating fluids well.        Jenness Corner, [place] 12/02/10 1946

## 2010-12-02 NOTE — ED Notes (Signed)
Patient was d/c on 10-23 with dx of pancreatitis.  Patient complains of abd pain and n/v.  Patient admits to recent etoh Mon or Tues

## 2010-12-02 NOTE — ED Notes (Signed)
The pt has a poor selection of veins.  Iv team called to start iv.  They will be copming

## 2010-12-03 NOTE — ED Provider Notes (Signed)
Medical screening examination/treatment/procedure(s) were performed by non-physician practitioner and as supervising physician I was immediately available for consultation/collaboration.  Dontray Haberland L Maddix Kliewer, MD 12/03/10 0138 

## 2010-12-09 ENCOUNTER — Ambulatory Visit (INDEPENDENT_AMBULATORY_CARE_PROVIDER_SITE_OTHER): Payer: Self-pay | Admitting: Ophthalmology

## 2010-12-09 ENCOUNTER — Encounter: Payer: Self-pay | Admitting: Ophthalmology

## 2010-12-09 DIAGNOSIS — Z5900 Homelessness unspecified: Secondary | ICD-10-CM

## 2010-12-09 DIAGNOSIS — R454 Irritability and anger: Secondary | ICD-10-CM

## 2010-12-09 DIAGNOSIS — IMO0002 Reserved for concepts with insufficient information to code with codable children: Secondary | ICD-10-CM

## 2010-12-09 DIAGNOSIS — IMO0001 Reserved for inherently not codable concepts without codable children: Secondary | ICD-10-CM

## 2010-12-09 DIAGNOSIS — K8689 Other specified diseases of pancreas: Secondary | ICD-10-CM

## 2010-12-09 DIAGNOSIS — F911 Conduct disorder, childhood-onset type: Secondary | ICD-10-CM

## 2010-12-09 DIAGNOSIS — E1142 Type 2 diabetes mellitus with diabetic polyneuropathy: Secondary | ICD-10-CM | POA: Insufficient documentation

## 2010-12-09 DIAGNOSIS — F1020 Alcohol dependence, uncomplicated: Secondary | ICD-10-CM

## 2010-12-09 DIAGNOSIS — E1165 Type 2 diabetes mellitus with hyperglycemia: Secondary | ICD-10-CM

## 2010-12-09 DIAGNOSIS — I10 Essential (primary) hypertension: Secondary | ICD-10-CM | POA: Insufficient documentation

## 2010-12-09 DIAGNOSIS — K2970 Gastritis, unspecified, without bleeding: Secondary | ICD-10-CM

## 2010-12-09 DIAGNOSIS — K869 Disease of pancreas, unspecified: Secondary | ICD-10-CM

## 2010-12-09 LAB — GLUCOSE, CAPILLARY: Glucose-Capillary: 202 mg/dL — ABNORMAL HIGH (ref 70–99)

## 2010-12-09 NOTE — Assessment & Plan Note (Signed)
Mild, will defer until alcoholism under control.

## 2010-12-09 NOTE — Assessment & Plan Note (Addendum)
Stressed to patient that this is his main problem and it needs to be addressed for all aspects of his life to improve. Referred patient to alcohol and drug services. He is to follow up in 2 weeks.

## 2010-12-09 NOTE — Assessment & Plan Note (Signed)
Mild, will defer until alcoholism under control. 
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
## 2010-12-09 NOTE — Assessment & Plan Note (Signed)
Referred patient to get orange card. He was planning to see Rudell Cobb today. He will then be clear to see Dr. Dulce Sellar to get endoscopic ultrasound.

## 2010-12-09 NOTE — Assessment & Plan Note (Signed)
Stressed to patient that alcohol is the cause of his abdominal pain. He did not believe this initially since he drank for many years without pain.

## 2010-12-23 ENCOUNTER — Ambulatory Visit (INDEPENDENT_AMBULATORY_CARE_PROVIDER_SITE_OTHER): Payer: Self-pay | Admitting: Ophthalmology

## 2010-12-23 ENCOUNTER — Encounter: Payer: Self-pay | Admitting: Ophthalmology

## 2010-12-23 DIAGNOSIS — IMO0002 Reserved for concepts with insufficient information to code with codable children: Secondary | ICD-10-CM

## 2010-12-23 DIAGNOSIS — F911 Conduct disorder, childhood-onset type: Secondary | ICD-10-CM

## 2010-12-23 DIAGNOSIS — Z5900 Homelessness unspecified: Secondary | ICD-10-CM

## 2010-12-23 DIAGNOSIS — I10 Essential (primary) hypertension: Secondary | ICD-10-CM

## 2010-12-23 DIAGNOSIS — F1020 Alcohol dependence, uncomplicated: Secondary | ICD-10-CM

## 2010-12-23 DIAGNOSIS — IMO0001 Reserved for inherently not codable concepts without codable children: Secondary | ICD-10-CM

## 2010-12-23 DIAGNOSIS — E1165 Type 2 diabetes mellitus with hyperglycemia: Secondary | ICD-10-CM

## 2010-12-23 DIAGNOSIS — R454 Irritability and anger: Secondary | ICD-10-CM

## 2010-12-23 NOTE — Progress Notes (Signed)
agree with plans and notes 

## 2010-12-23 NOTE — Assessment & Plan Note (Signed)
Patient plans to walk in to [facility] on Tuesday for mental health txt.

## 2010-12-23 NOTE — Patient Instructions (Signed)
-  Follow up with Dorothe Pea who is our Child psychotherapist next week.   -Come back to see our clinic in 2 weeks. -Follow up with [facility] Drug [facility].

## 2010-12-23 NOTE — Progress Notes (Signed)
Subjective:   Patient ID: Michael [name] male   DOB: Aug 10, 1955 55 y.o.   MRN: [id number]  HPI: [name] is a 55 y.o. man who is here for his 2nd visit. Following up for alcoholic gastritis, homelessness.  Financial: saw Michael [name] -saw Social security administration, has appt Soc Sec Dec 6th. Applying for disability due to pancreatitis, mental health may be more likely to qualify him. 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 Feels they want him to go back to jail. Family members wouldn't give him money to buy his acid medication.  [facility]- Tuesday morning can walk-in. Ran out of bus tickets.  Alcohol: drank 1-2 beers on Thanksgiving hasn't had an alcohol since then. Didn't speak to alcohol and drug services.   GI: called Dr. Hulen [name] office, they are hesitant to see him since he has missed several appts. Doesn't have his orange card yet. No vomiting or nausea since last visit. Feels like his drink and food 'gets stuck' he will burp and feels better. No choking. Eating and drinking okay. Stomach aches a little bit and stomach 'bubbles', is new. Has appt Dec 7th.   Social Work: can refer to Michael [name]- to see if she has any additional input on disability and mental health, housing etc. Can schedule him for next Wednesday per patient.  Past Medical History  Diagnosis Date  . Pancreatitis   . Smoker   . Diabetes mellitus     diet controlled  . Gastritis due to alcohol without hemorrhage   . HTN (hypertension)    Current Outpatient Prescriptions  Medication Sig Dispense Refill  . ibuprofen  (ADVIL,MOTRIN) 400 MG tablet Take 800 mg by mouth every 4 (four) hours as needed.         No family history on file. History   Social History  . Marital Status: Married    Spouse Name: N/A    Number of Children: N/A  . Years of Education: N/A   Occupational History  . 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 Smoking status: Current Everyday Smoker -- 0.1 packs/day    Types: Cigarettes  . Smokeless tobacco: None  . Alcohol Use: Yes     Drinks 1-2 40 oz beers  . Drug Use: Yes    Special: Marijuana  . Sexually Active: None   Other Topics Concern  . None   Social History Narrative   Has been living at 3 different friend's homes since discharge. Was living in the shelter prior to admission. Had previously lived with his niece and other family members but he has gotten kicked out each time.   Objective:  Physical Exam: Filed Vitals:   12/23/10 1425  BP: 132/82  Pulse: 84  Temp: 97.9 F (36.6 C)  TempSrc: Oral  Height: 5\' 5"  (1.651 m)  Weight: 208 lb 9.6 oz (94.62 kg)  SpO2: 100%   General: middle aged man with  military style hat on HEENT: PERRL, EOMI, no scleral icterus, dysconjugate gaze Cardiac: RRR, no rubs, murmurs or gallops Pulm: clear to auscultation bilaterally, moving normal volumes of air Abd: somewhat firm, nontender diffusely, BS present, point tenderness to left flank Ext: warm and well perfused, no pedal edema Neuro: alert and oriented X3, cranial nerves II-XII grossly intact Psych: speaks very quickly and tends to focus on anger towards his family 'there have been problems with them for a WHILE'  Assessment & Plan:

## 2010-12-23 NOTE — Progress Notes (Signed)
  Subjective:    Patient ID: Michael [name], male    DOB: [birth year], 55 y.o.   MRN: [id number]  HPI    Review of Systems     Objective:   Physical Exam        Assessment & Plan:

## 2010-12-23 NOTE — Assessment & Plan Note (Signed)
He seems to have a somewhat steady living situation although it is not permanent. Is looking for work. Still has concerns for transportation, living situation and desire to obtain disability. Referred to Dorothe Pea for help with these issues.

## 2010-12-23 NOTE — Assessment & Plan Note (Signed)
Encouraged patient to call alcohol and drug services and attend treatment there. Although he is successful in quitting EtOH on his own, he would benefit from a support group.

## 2010-12-23 NOTE — Assessment & Plan Note (Signed)
Appears to be resolved with resolution of abdominal pain.

## 2010-12-23 NOTE — Assessment & Plan Note (Signed)
Please get HgA1c at next visit. May not be a true diabetic.

## 2011-01-03 ENCOUNTER — Telehealth: Payer: Self-pay | Admitting: Internal Medicine

## 2011-01-03 NOTE — Telephone Encounter (Signed)
lmom for pt to call back

## 2011-01-04 ENCOUNTER — Ambulatory Visit: Payer: Self-pay | Admitting: Licensed Clinical Social Worker

## 2011-01-04 DIAGNOSIS — Z5900 Homelessness unspecified: Secondary | ICD-10-CM

## 2011-01-04 NOTE — Telephone Encounter (Signed)
lmom for pt to call back. Pt was set up to see Dr Dulce Sellar per 12/02/10 notes and did not f/u. He was also to f/u at the [facility] per notes.
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
## 2011-01-05 NOTE — Telephone Encounter (Signed)
Man answered at number listed and that is not pt's number per him. Number listed in [employer] does not answer and he no longer works at [employer].

## 2011-01-06 ENCOUNTER — Encounter: Payer: Self-pay | Admitting: Internal Medicine

## 2011-01-06 ENCOUNTER — Ambulatory Visit (INDEPENDENT_AMBULATORY_CARE_PROVIDER_SITE_OTHER): Payer: Self-pay | Admitting: Internal Medicine

## 2011-01-06 ENCOUNTER — Other Ambulatory Visit: Payer: Self-pay | Admitting: Internal Medicine

## 2011-01-06 VITALS — BP 144/99 | HR 92 | Temp 97.7°F | Ht 65.0 in | Wt 200.3 lb

## 2011-01-06 DIAGNOSIS — R059 Cough, unspecified: Secondary | ICD-10-CM

## 2011-01-06 DIAGNOSIS — E119 Type 2 diabetes mellitus without complications: Secondary | ICD-10-CM

## 2011-01-06 DIAGNOSIS — IMO0002 Reserved for concepts with insufficient information to code with codable children: Secondary | ICD-10-CM

## 2011-01-06 DIAGNOSIS — R05 Cough: Secondary | ICD-10-CM

## 2011-01-06 DIAGNOSIS — E1165 Type 2 diabetes mellitus with hyperglycemia: Secondary | ICD-10-CM

## 2011-01-06 DIAGNOSIS — IMO0001 Reserved for inherently not codable concepts without codable children: Secondary | ICD-10-CM

## 2011-01-06 DIAGNOSIS — Z0000 Encounter for general adult medical examination without abnormal findings: Secondary | ICD-10-CM

## 2011-01-06 DIAGNOSIS — K869 Disease of pancreas, unspecified: Secondary | ICD-10-CM

## 2011-01-06 DIAGNOSIS — I10 Essential (primary) hypertension: Secondary | ICD-10-CM

## 2011-01-06 DIAGNOSIS — K8689 Other specified diseases of pancreas: Secondary | ICD-10-CM

## 2011-01-06 MED ORDER — LISINOPRIL-HYDROCHLOROTHIAZIDE 20-12.5 MG PO TABS: 1.0000 | ORAL_TABLET | Freq: Every day | ORAL | Status: DC

## 2011-01-06 NOTE — Assessment & Plan Note (Signed)
Given A1c is above 6.5 he meets criteria for DM. But for now will not treat with medication, will continue diet control only and check a1c q3 months, will treat if a1c goes beyond 7. Will perform all routine care for diabetics including eye care, urine tests etc.. Will recheck a1c in 1 month.

## 2011-01-06 NOTE — Assessment & Plan Note (Signed)
BP elevated, will treat with lisinopril/hctz and bring back in several weeks for BMET and BP recheck.

## 2011-01-06 NOTE — Assessment & Plan Note (Addendum)
Pt had MRI in 10/2010 which showed area of focal pancreatic ductal dilatation within the tail with abrupt transition to normal caliber duct within the junction of the body and tail. Given history pancreatitis, this could represent a benign stricture. However, a non border deforming early/subtle adenocarcinoma could have a similar appearance. Potential clinical strategies would include correlation with tumor markers and consideration of endoscopic guided ultrasound biopsy. If tissue sampling is not possible, secondary to the abnormality being positioned within the pancreatic tail, alternate strategy of 36-month follow-up with pre and post contrast abdominal MRI would be appropriate.  will place GI referral for endoscopic ultrasound if not possible will recheck MRI next month.

## 2011-01-06 NOTE — Patient Instructions (Signed)
Please take your new blood pressure medication as instructed.

## 2011-01-06 NOTE — Progress Notes (Signed)
Subjective:     Patient ID: Michael [name], male   DOB: [birth year], 55 y.o.   MRN: [id number]  HPI  Patient is a 55 year old male with a PMH listed below presents to the [facility] for a routine followup. He is not on any medication currently,does not check his sugars for DM. Denies any other complaints.   Patient Active Problem List  Diagnoses  . Alcoholism  . Gastritis  . Pancreatic mass  . DM w/o complication type II, uncontrolled  . HTN (hypertension)  . Inadequate social support  . Excessive anger   Current Outpatient Prescriptions on File Prior to Visit  Medication Sig Dispense Refill  . ibuprofen (ADVIL,MOTRIN) 400 MG tablet Take 800 mg by mouth every 4 (four) hours as needed.         No Known Allergies   Review of Systems  All other systems reviewed and are negative.      Objective:   Physical Exam  Nursing note and vitals reviewed. Constitutional: He is oriented to person, place, and time. He appears well-developed and well-nourished.  HENT:  Head: Normocephalic and atraumatic.  Eyes: Pupils are equal, round, and reactive to light.  Neck: Normal range of motion. No JVD present. No thyromegaly present.  Cardiovascular: Normal rate, regular rhythm and normal heart sounds.   Pulmonary/Chest: Effort normal and breath sounds normal. He has no wheezes. He has no rales.  Abdominal: Soft. Bowel sounds are normal. There is no tenderness. There is no rebound.  Musculoskeletal: Normal range of motion. He exhibits no edema.  Neurological: He is alert and oriented to person, place, and time.  Skin: Skin is warm and dry.

## 2011-01-07 LAB — COMPREHENSIVE METABOLIC PANEL
ALT: 59 U/L — ABNORMAL HIGH (ref 0–53)
CO2: 27 mEq/L (ref 19–32)
Calcium: 8.9 mg/dL (ref 8.4–10.5)
Chloride: 104 mEq/L (ref 96–112)
Creat: 0.83 mg/dL (ref 0.50–1.35)
Glucose, Bld: 106 mg/dL — ABNORMAL HIGH (ref 70–99)
Sodium: 141 mEq/L (ref 135–145)
Total Bilirubin: 1.7 mg/dL — ABNORMAL HIGH (ref 0.3–1.2)
Total Protein: 6.6 g/dL (ref 6.0–8.3)

## 2011-01-07 LAB — LIPID PANEL
Cholesterol: 151 mg/dL (ref 0–200)
HDL: 37 mg/dL — ABNORMAL LOW (ref >39)
LDL Cholesterol: 45 mg/dL (ref 0–99)
Triglycerides: 346 mg/dL — ABNORMAL HIGH (ref <150)
VLDL: 69 mg/dL — ABNORMAL HIGH (ref 0–40)

## 2011-01-11 NOTE — Progress Notes (Signed)
I discussed the patient with Dr Thomas and agree with the note contained here.   

## 2011-01-31 ENCOUNTER — Telehealth: Payer: Self-pay | Admitting: *Deleted

## 2011-01-31 NOTE — Telephone Encounter (Signed)
Pt called for the results of lab work done on 12/14.  He has a f/u on 1/16 with Dr Berlinda Last. Results are abnormal.  Not sure if reviewed? Pt # [id number]

## 2011-01-31 NOTE — Telephone Encounter (Signed)
Unable to reach pt by phone, tried X 2

## 2011-01-31 NOTE — Telephone Encounter (Signed)
3:15 PM. I tried to call patient about his labs and the phone was not where he was. Please let him know that his labs will be fully reviewed next week at his OV, and there is nothing that can't wait until then Thank you.

## 2011-02-02 ENCOUNTER — Encounter (HOSPITAL_COMMUNITY): Payer: Self-pay | Admitting: *Deleted

## 2011-02-02 ENCOUNTER — Inpatient Hospital Stay (HOSPITAL_COMMUNITY)
Admission: EM | Admit: 2011-02-02 | Discharge: 2011-02-03 | DRG: 916 | Disposition: A | Discharge status: Home / Self Care | Payer: Self-pay | Attending: Internal Medicine | Admitting: Internal Medicine

## 2011-02-02 DIAGNOSIS — K869 Disease of pancreas, unspecified: Secondary | ICD-10-CM | POA: Diagnosis present

## 2011-02-02 DIAGNOSIS — T46905A Adverse effect of unspecified agents primarily affecting the cardiovascular system, initial encounter: Secondary | ICD-10-CM | POA: Diagnosis present

## 2011-02-02 DIAGNOSIS — I10 Essential (primary) hypertension: Secondary | ICD-10-CM | POA: Diagnosis present

## 2011-02-02 DIAGNOSIS — E1142 Type 2 diabetes mellitus with diabetic polyneuropathy: Secondary | ICD-10-CM | POA: Diagnosis present

## 2011-02-02 DIAGNOSIS — T783XXA Angioneurotic edema, initial encounter: Principal | ICD-10-CM | POA: Diagnosis present

## 2011-02-02 DIAGNOSIS — E119 Type 2 diabetes mellitus without complications: Secondary | ICD-10-CM | POA: Diagnosis present

## 2011-02-02 LAB — CBC
HCT: 40.7 % (ref 39.0–52.0)
Hemoglobin: 14.6 g/dL (ref 13.0–17.0)
MCH: 33.1 pg (ref 26.0–34.0)
MCHC: 35.9 g/dL (ref 30.0–36.0)
RDW: 15.4 % (ref 11.5–15.5)

## 2011-02-02 LAB — COMPREHENSIVE METABOLIC PANEL
ALT: 53 U/L (ref 0–53)
AST: 29 U/L (ref 0–37)
Alkaline Phosphatase: 132 U/L — ABNORMAL HIGH (ref 39–117)
CO2: 26 mEq/L (ref 19–32)
Chloride: 101 mEq/L (ref 96–112)
GFR calc Af Amer: >90 mL/min (ref >90)
GFR calc non Af Amer: >90 mL/min (ref >90)
Glucose, Bld: 173 mg/dL — ABNORMAL HIGH (ref 70–99)
Potassium: 3.8 mEq/L (ref 3.5–5.1)
Sodium: 137 mEq/L (ref 135–145)

## 2011-02-02 LAB — PROTIME-INR
INR: 1.05 (ref 0.00–1.49)
Prothrombin Time: 13.9 seconds (ref 11.6–15.2)

## 2011-02-02 MED ORDER — HEPARIN SODIUM (PORCINE) 5000 UNIT/ML IJ SOLN
5000.0000 [IU] | Freq: Three times a day (TID) | INTRAMUSCULAR | Status: DC
  Administered 2011-02-02: 5000 [IU] via SUBCUTANEOUS
  Filled 2011-02-02 (×5): qty 1

## 2011-02-02 MED ORDER — METHYLPREDNISOLONE SODIUM SUCC 125 MG IJ SOLR
60.0000 mg | Freq: Four times a day (QID) | INTRAMUSCULAR | Status: DC
  Administered 2011-02-02 – 2011-02-03 (×3): 60 mg via INTRAVENOUS
  Filled 2011-02-02 (×3): qty 0.96
  Filled 2011-02-02: qty 2
  Filled 2011-02-02 (×2): qty 0.96

## 2011-02-02 MED ORDER — TRAMADOL HCL 50 MG PO TABS: 50.0000 mg | ORAL_TABLET | Freq: Four times a day (QID) | ORAL | Status: DC | PRN

## 2011-02-02 MED ORDER — FAMOTIDINE IN NACL 20-0.9 MG/50ML-% IV SOLN
20.0000 mg | Freq: Two times a day (BID) | INTRAVENOUS | Status: DC
  Administered 2011-02-02 – 2011-02-03 (×2): 20 mg via INTRAVENOUS
  Filled 2011-02-02 (×3): qty 50

## 2011-02-02 MED ORDER — HYDROCHLOROTHIAZIDE 25 MG PO TABS
25.0000 mg | ORAL_TABLET | Freq: Every day | ORAL | Status: DC
  Administered 2011-02-03: 25 mg via ORAL
  Filled 2011-02-02 (×2): qty 1

## 2011-02-02 MED ORDER — SODIUM CHLORIDE 0.9 % IV SOLN: 250.0000 mL | INTRAVENOUS | Status: DC | PRN

## 2011-02-02 MED ORDER — FAMOTIDINE IN NACL 20-0.9 MG/50ML-% IV SOLN
20.0000 mg | Freq: Once | INTRAVENOUS | Status: AC
  Administered 2011-02-02: 20 mg via INTRAVENOUS
  Filled 2011-02-02: qty 50

## 2011-02-02 MED ORDER — METHYLPREDNISOLONE SODIUM SUCC 125 MG IJ SOLR
125.0000 mg | Freq: Once | INTRAMUSCULAR | Status: AC
  Administered 2011-02-02: 125 mg via INTRAVENOUS
  Filled 2011-02-02: qty 2

## 2011-02-02 MED ORDER — DIPHENHYDRAMINE HCL 50 MG/ML IJ SOLN
25.0000 mg | Freq: Two times a day (BID) | INTRAMUSCULAR | Status: DC
  Administered 2011-02-02 – 2011-02-03 (×2): 25 mg via INTRAVENOUS
  Filled 2011-02-02 (×3): qty 0.5
  Filled 2011-02-02: qty 1

## 2011-02-02 MED ORDER — DIPHENHYDRAMINE HCL 50 MG/ML IJ SOLN
25.0000 mg | Freq: Once | INTRAMUSCULAR | Status: AC
  Administered 2011-02-02: 25 mg via INTRAVENOUS
  Filled 2011-02-02: qty 1

## 2011-02-02 NOTE — ED Notes (Signed)
C/o right side face numbness onset 1300. Also c/o right side lip edema & difficulty swallowing x 3 hrs.  Reports hives & itching yesterday, none today. No resp distress presently.

## 2011-02-02 NOTE — ED Notes (Signed)
2604-01 Ready 

## 2011-02-02 NOTE — H&P (Signed)
Hospital Admission Note Date: 02/02/2011  Patient name: Michael [name] Medical record number: [id number] Date of birth: November 09, 1955 Age: 56 y.o. Gender: male PCP: Margorie John, MD, MD  Medical Service: Internal Medicine Teaching Service  Attending physician:  Dr. Blanch Media    1st Contact: Dr. Candy Sledge   Pager: [phone] 2nd Contact: Dr. Anselm Jungling    Pager: [phone] After 5 pm or weekends: 1st Contact:      Pager: [phone] 2nd Contact:      Pager: [phone]  Chief Complaint: Trouble swallowing and face swelling  History of Present Illness:  Michael [name] is a 56 y.o. man with a history of hypertension, diabetes mellitus, and pancreatitis who presents to the ED complaining of right facial swelling and difficulty swallowing.  He reports that "food hung up" in his throat and required some effort to swallow this morning, and shortly after noted his lips swelling mostly on the right at about 10 AM.  The swelling slowly increased and included the periorbital area. The right side of his face feels slightly "funny". He denies shortness or breath, chest pain, abdominal pain, nausea, vomiting, or fever. Pt is not aware of any allergies.  Pt started taking a new antihypertensive medication yesterday that is a combination of 2 medications but does not know the name (now confirmed lisinopril-HCTZ). He was prescribed this medication several weeks ago but did not fill the prescription until yesterday. He recalls a similar episode of angioedema in 1990 for which he received a "shot", but does not remember the cause of swelling at that time.  Pt has had a "cyst" lateral to the left eyebrow for a few months.  It is not painful but he mentions wanting to have it removed in the future.  Meds: Medications Prior to Admission  Medication Dose Route Frequency Provider Last Rate Last Dose  . diphenhydrAMINE (BENADRYL) injection 25 mg  25 mg Intravenous Once [employer]. Pickering, MD   25 mg at 02/02/11 1357  . famotidine  (PEPCID) IVPB 20 mg  20 mg Intravenous Once [employer]. Pickering, MD   20 mg at 02/02/11 1357  . methylPREDNISolone sodium succinate (SOLU-MEDROL) 125 MG injection 125 mg  125 mg Intravenous Once [employer]. Pickering, MD   125 mg at 02/02/11 1357   No current outpatient prescriptions on file as of 02/02/2011.  -- lisinopri//HCTZ 20-12.5mg  po daily.  Allergies: ACE-inhibitors (angioedema) 02/02/11  Past Medical History  Diagnosis Date  . Pancreatitis   . Smoker   . Diabetes mellitus     diet controlled  . Gastritis due to alcohol without hemorrhage   . HTN (hypertension)    Past Surgical History  Procedure Date  . No past surgeries    No family history on file. History   Social History  . Marital Status: Married    Spouse Name: N/A    Number of Children: N/A  . Years of Education: N/A   Occupational History  . 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 Smoking status: Current Everyday Smoker -- 0.1 packs/day    Types: Cigarettes  . Smokeless tobacco: Not on file  . Alcohol Use: Yes     Drinks 1-2 40 oz beers  . Drug Use: Yes    Special: Marijuana  . Sexually Active: Not on file   Other Topics Concern  .  Not on file   Social History Narrative   Has been living at 3 different friend's homes since discharge. Was living in the shelter prior to admission. Had previously lived with his niece and other family members but he has gotten kicked out each time.    Review of Systems: As per HPI.  Physical Exam: Blood pressure 122/79, pulse 86, temperature 98.9 F (37.2 C), temperature source Oral, resp. rate 16, height 5\' 5"  (1.651 m), weight 90.719 kg (200 lb), SpO2 98.00%. General: alert, cooperative, sitting on edge of bed, no apparent distress HEENT: atraumatic; moist mucous membranes; eyes without erythema or drainage; notable edema of right face with swelling of  lips and periorbital area; right buccal mucosa severely edematous, right tongue- edematous; posterior pharynx difficult to assess due to intraoral edema Neck: without masses or palpable lymphadenopathy CV: RRR, no murmurs, rubs, or gallops Pulm: CTAB, no wheezes, crackles, or rhonchi Abdomen: Nontender, nondistended, BS+ and normal Extremities: warm and well-perfused, no pedal edema, no swelling of joints Skin: 1cm x 1cm soft, mobile, skin-colored, nontender subcutaneous nodule lateral to left eyebrow with 1mm x 1mm punctum vs. depressed scar; no drainage of nodule (epidermoid cyst vs lipoma) Neuro: Alert and oriented x3, cranial nerves II-XII grossly intact; strength grossly intact throughout  Lab results: CMP     Component Value Date/Time   NA 137 02/02/2011 1737   K 3.8 02/02/2011 1737   CL 101 02/02/2011 1737   CO2 26 02/02/2011 1737   GLUCOSE 173* 02/02/2011 1737   BUN 13 02/02/2011 1737   CREATININE 0.79 02/02/2011 1737   CREATININE 0.83 01/06/2011 1551   CALCIUM 10.1 02/02/2011 1737   PROT 7.4 02/02/2011 1737   ALBUMIN 3.4* 02/02/2011 1737   AST 29 02/02/2011 1737   ALT 53 02/02/2011 1737   ALKPHOS 132* 02/02/2011 1737   BILITOT 0.6 02/02/2011 1737   GFRNONAA >90 02/02/2011 1737   GFRAA >90 02/02/2011 1737     CBC    Component Value Date/Time   WBC 11.5* 02/02/2011 1737   RBC 4.41 02/02/2011 1737   HGB 14.6 02/02/2011 1737   HCT 40.7 02/02/2011 1737   PLT 159 02/02/2011 1737   MCV 92.3 02/02/2011 1737   MCH 33.1 02/02/2011 1737   MCHC 35.9 02/02/2011 1737   RDW 15.4 02/02/2011 1737   LYMPHSABS 1.2 12/02/2010 1721   MONOABS 1.0 12/02/2010 1721   EOSABS 0.0 12/02/2010 1721   BASOSABS 0.0 12/02/2010 1721     Lab Results  Component Value Date   INR 1.05 02/02/2011     Imaging results:  No imaging ordered.  Assessment & Plan by Problem:  Pt is a 56yo male with a hx of hypertension, diabetes, and pancreatitis who presents with angioedema likely due to recent lisinopril-HCTZ use (now  discontinued). He is stable but requires therapy and monitoring until the angioedema and dysphagia resolve.  Primary problem: 1) Angioedema - Pt's angioedema very likely due to ACE-inhibitor sensitivity, a not-uncommon side effect of this class of medication.  Pt began taking lisinopril-HCTZ 20-12.5mg  daily yesterday after being given an outpatient prescription 4 weeks ago, and first noticed dysphagia and edema this AM. He has not had any shortness of breath with this episode of angioedema, but we will continue monitoring for this complication and support respiration as needed. He does not have any other known allergies, and it is unclear and unlikely that he had been taking an ACE-inhibitor with potential angioedema in 1990. Pt had received SOLU-MEDROL 125mg  injection while in  the ED. --Discontinue lisinopril-HCTZ. Pt may not be prescribed ACE-inhibitors in the future (will chart in FYI). --Methylprednisone (SOLU-MEDROL) 60mg  injection q6h. --Famotidine 20mg  IV q12h --Diphenhydramine 25mg  injection bid --Tramadol 50mg  q6h prn pain. --NPO as pt had dysphagia this AM and angioedema has not abated.  Will evaluate in AM and start p.o. medications if patient can tolerate p.o. intake. --CBC, CMP, PT/INR pending. Will also recheck these labs in AM.  2) Hypertension - Pt has a history of hypertension and started taking lisinopril-HCTZ yesterday. Given angioedema, we have stopped lisinopril-HCTZ. His blood pressure during this admission has been well-controlled.  Will continue monitoring vitals and start HCTZ .  Will likely start HCTZ at discharge and optimize antihypertensive therapy on an outpatient basis.  3) Diabetes Mellitus Type II - Last HgA1c was 6.7 on 11/13/10, and the patient is managed with diet control alone at our outpatient clinic. Glucose 173 on admission since he received steroids in the ED and will continue to be on steroid for angioedema treatment, therefore we will start SSI insulin for  glycemic control. Pt is NPO until angioedema and dysphagia improve.  Will evaluate in AM and consider starting a modified carbohydrate diet. ACE-inhibitors are renal-protective in diabetic patients, but this pt can no longer be prescribed this class of medications due to angioedema. --SSI for hyperglycemia  4) Pancreatic pass - Potential pancreatic mass suspected on MRI (11/13/10) based on area of focal pancreatic ductal dilatation within the tail with abrupt transition to normal caliber duct within the junction of the body and tail. This could represent a benign stricture or adenocarcinoma. Outpatient management recommends GI referral for endoscopic ultrasound, or f/u abdominal MRI if this is not possible. This strategy can be implemented on an outpatient basis and will not be addressed this admission. Risk factors for pancreatic malignancy in this patient include hx of pancreatitis, smoking status, diabetes mellitus II, and male gender.  5) Lipoma - Nodule lateral to left eyebrow is likely a lipoma, as it is soft, mobile, non-tender, skin-colored and subcutaneous.  A sebaceous cyst remains in the differential diagnosis. Pt can consider elective excision as an outpatient.  6) DVT PPX - heparin 5000U  Signed: Vita Barley, MS-III 02/02/2011, 6:21 PM   Carrolyn Meiers, PGY-II 02/02/11, 7:53PM

## 2011-02-02 NOTE — ED Provider Notes (Signed)
History     CSN: [id number]  Arrival date & time 02/02/11  1323   First MD Initiated Contact with Patient 02/02/11 1328      Chief Complaint  Patient presents with  . Numbness    (Consider location/radiation/quality/duration/timing/severity/associated sxs/prior treatment) The history is provided by the patient.   patient woke up with a strange feeling in his throat this morning. States it was hard to swallow and felt full. He's had some mild difficulty breathing. No throwing up. His face later started to feel numb and swell in the right side. No fevers. No trauma. He has not had episodes like this before. No diarrhea. No chest pain.  Past Medical History  Diagnosis Date  . Pancreatitis   . Smoker   . Diabetes mellitus     diet controlled  . Gastritis due to alcohol without hemorrhage   . HTN (hypertension)     Past Surgical History  Procedure Date  . No past surgeries     No family history on file.  History  Substance Use Topics  . Smoking status: Current Everyday Smoker -- 0.1 packs/day    Types: Cigarettes  . Smokeless tobacco: Not on file  . Alcohol Use: Yes     Drinks 1-2 40 oz beers      Review of Systems  Constitutional: Negative for fatigue.  HENT: Positive for trouble swallowing. Negative for sore throat and drooling.   Respiratory: Negative for shortness of breath and stridor.   Gastrointestinal: Negative for abdominal pain.  Genitourinary: Negative for flank pain.  Musculoskeletal: Negative for back pain.  Neurological: Positive for numbness.  Hematological: Negative for adenopathy.    Allergies  Review of patient's allergies indicates no known allergies.  Home Medications   Current Outpatient Rx  Name Route Sig Dispense Refill  . HYDROCHLOROTHIAZIDE 50 MG PO TABS Oral Take 25 mg by mouth daily.    . IBUPROFEN 400 MG PO TABS Oral Take 800 mg by mouth every 4 (four) hours as needed. pain    . LISINOPRIL 20 MG PO TABS Oral Take 20 mg by mouth  daily.      BP 140/91  Pulse 97  Temp(Src) 98.9 F (37.2 C) (Oral)  Resp 18  Ht 5\' 5"  (1.651 m)  Wt 200 lb (90.719 kg)  BMI 33.28 kg/m2  SpO2 100%  Physical Exam  Nursing note and vitals reviewed. Constitutional: He is oriented to person, place, and time. He appears well-developed and well-nourished.  HENT:  Head: Normocephalic and atraumatic.       Right upper lip swollen. Angioedema. Posterior pharyngeal angioedema uvula and to right side. No stridor.  Eyes: EOM are normal. Pupils are equal, round, and reactive to light.  Neck: Normal range of motion. Neck supple.  Cardiovascular: Normal rate, regular rhythm and normal heart sounds.   No murmur heard. Pulmonary/Chest: Effort normal and breath sounds normal.  Abdominal: Soft. Bowel sounds are normal. He exhibits no distension and no mass. There is no tenderness. There is no rebound and no guarding.  Musculoskeletal: Normal range of motion. He exhibits no edema.  Neurological: He is alert and oriented to person, place, and time. No cranial nerve deficit.  Skin: Skin is warm and dry.  Psychiatric: He has a normal mood and affect.    ED Course  Procedures (including critical care time)  Labs Reviewed - No data to display No results found.   1. Angioedema       MDM  Angioedema of the  posterior pharynx and right lip. Some mild worsening of the right lip since he's been in the ER.   he does not appear to be in imminent respiratory distress. Since he's not improved, he'll be admitted to the hospital. His primary care Dr. is the outpatient clinic.       Juliet Rude. Rubin Payor, MD 02/02/11 [phone]

## 2011-02-03 ENCOUNTER — Other Ambulatory Visit: Payer: Self-pay | Admitting: Internal Medicine

## 2011-02-03 DIAGNOSIS — T783XXA Angioneurotic edema, initial encounter: Principal | ICD-10-CM

## 2011-02-03 LAB — LIPID PANEL
LDL Cholesterol: 61 mg/dL (ref 0–99)
Triglycerides: 77 mg/dL (ref <150)
VLDL: 15 mg/dL (ref 0–40)

## 2011-02-03 LAB — BASIC METABOLIC PANEL
BUN: 13 mg/dL (ref 6–23)
CO2: 27 mEq/L (ref 19–32)
Calcium: 9.5 mg/dL (ref 8.4–10.5)
Chloride: 103 mEq/L (ref 96–112)
Creatinine, Ser: 0.7 mg/dL (ref 0.50–1.35)

## 2011-02-03 LAB — CBC
HCT: 39.8 % (ref 39.0–52.0)
MCHC: 35.4 g/dL (ref 30.0–36.0)
MCV: 92.1 fL (ref 78.0–100.0)
Platelets: 168 10*3/uL (ref 150–400)
RDW: 15.2 % (ref 11.5–15.5)
WBC: 8.1 10*3/uL (ref 4.0–10.5)

## 2011-02-03 MED ORDER — HYDROCHLOROTHIAZIDE 25 MG PO TABS: 25.0000 mg | ORAL_TABLET | Freq: Every day | ORAL | Status: DC

## 2011-02-03 MED ORDER — DIPHENHYDRAMINE HCL 25 MG PO TABS: 25.0000 mg | ORAL_TABLET | Freq: Four times a day (QID) | ORAL | Status: AC | PRN

## 2011-02-03 MED ORDER — PREDNISONE (PAK) 10 MG PO TABS: ORAL_TABLET | ORAL | Status: DC

## 2011-02-03 NOTE — Progress Notes (Signed)
Inpatient Diabetes Program Recommendations  AACE/ADA: New Consensus Statement on Inpatient Glycemic Control (2009)  Target Ranges:  Prepandial:   less than 140 mg/dL      Peak postprandial:   less than 180 mg/dL (1-2 hours)      Critically ill patients:  140 - 180 mg/dL   Reason for Visit: Hyperglycemia  Inpatient Diabetes Program Recommendations HgbA1C: Need updated HgbA1C (last one 6.7%)

## 2011-02-03 NOTE — Progress Notes (Signed)
Clinical Social Work-Full assessment to follow in shadow chart-CSW familiar with this patient. Pt is active with [facility] and was in needed of bus basses to assist with medial appointments. CSW was able to provide passes as well as list of hot meals and resources in the community. Pt will follow up with SW if any other needs arise-Coye Dawood-MSW, [phone]

## 2011-02-03 NOTE — H&P (Signed)
Internal Medicine Teaching Service Attending Note Date: 02/03/2011  Patient name: Michael [name]  Medical record number: [id number]  Date of birth: [birth year]   I have seen and evaluated Michael [name] and discussed their care with the Residency Team. Please see Dr Rosemarie Ax H&P for full details. Briefly, Michael [name] was admitted for ACEI induced angioedema. His swelling (R lip, tongue, buccal, and face) has mostly resolved. He was placed on steroids, benadryl, and H2Blocker. He possible had a similar episode in past. He now is asking for hydrocodone for chronic L sided ABD pain from his pancreatitis.  Physical Exam: Blood pressure 134/85, pulse 92, temperature 98.2 F (36.8 C), temperature source Oral, resp. rate 18, height 5\' 5"  (1.651 m), weight 191 lb 8 oz (86.864 kg), SpO2 100.00%. Pt is ambulating well. He has pressured speech. No tremor.  Thought content and judgement normal. LCTAB. ABD + BS, soft, NT   Lab results: Results for orders placed during the hospital encounter of 02/02/11 (from the past 24 hour(s))  COMPREHENSIVE METABOLIC PANEL     Status: Abnormal   Collection Time   02/02/11  5:37 PM      Component Value Range   Sodium 137  135 - 145 (mEq/L)   Potassium 3.8  3.5 - 5.1 (mEq/L)   Chloride 101  96 - 112 (mEq/L)   CO2 26  19 - 32 (mEq/L)   Glucose, Bld 173 (*) 70 - 99 (mg/dL)   BUN 13  6 - 23 (mg/dL)   Creatinine, Ser 4.09  0.50 - 1.35 (mg/dL)   Calcium 81.1  8.4 - 10.5 (mg/dL)   Total Protein 7.4  6.0 - 8.3 (g/dL)   Albumin 3.4 (*) 3.5 - 5.2 (g/dL)   AST 29  0 - 37 (U/L)   ALT 53  0 - 53 (U/L)   Alkaline Phosphatase 132 (*) 39 - 117 (U/L)   Total Bilirubin 0.6  0.3 - 1.2 (mg/dL)   GFR calc non Af Amer >90  >90 (mL/min)   GFR calc Af Amer >90  >90 (mL/min)  CBC     Status: Abnormal   Collection Time   02/02/11  5:37 PM      Component Value Range   WBC 11.5 (*) 4.0 - 10.5 (K/uL)   RBC 4.41  4.22 - 5.81 (MIL/uL)   Hemoglobin 14.6  13.0 - 17.0 (g/dL)   HCT 91.4   78.2 - 95.6 (%)   MCV 92.3  78.0 - 100.0 (fL)   MCH 33.1  26.0 - 34.0 (pg)   MCHC 35.9  30.0 - 36.0 (g/dL)   RDW 21.3  08.6 - 57.8 (%)   Platelets 159  150 - 400 (K/uL)  PROTIME-INR     Status: Normal   Collection Time   02/02/11  5:37 PM      Component Value Range   Prothrombin Time 13.9  11.6 - 15.2 (seconds)   INR 1.05  0.00 - 1.49   BASIC METABOLIC PANEL     Status: Abnormal   Collection Time   02/03/11  4:01 AM      Component Value Range   Sodium 139  135 - 145 (mEq/L)   Potassium 3.9  3.5 - 5.1 (mEq/L)   Chloride 103  96 - 112 (mEq/L)   CO2 27  19 - 32 (mEq/L)   Glucose, Bld 187 (*) 70 - 99 (mg/dL)   BUN 13  6 - 23 (mg/dL)   Creatinine, Ser 4.69  0.50 - 1.35 (  mg/dL)   Calcium 9.5  8.4 - 21.3 (mg/dL)   GFR calc non Af Amer >90  >90 (mL/min)   GFR calc Af Amer >90  >90 (mL/min)  CBC     Status: Normal   Collection Time   02/03/11  4:01 AM      Component Value Range   WBC 8.1  4.0 - 10.5 (K/uL)   RBC 4.32  4.22 - 5.81 (MIL/uL)   Hemoglobin 14.1  13.0 - 17.0 (g/dL)   HCT 08.6  57.8 - 46.9 (%)   MCV 92.1  78.0 - 100.0 (fL)   MCH 32.6  26.0 - 34.0 (pg)   MCHC 35.4  30.0 - 36.0 (g/dL)   RDW 62.9  52.8 - 41.3 (%)   Platelets 168  150 - 400 (K/uL)  MRSA PCR SCREENING     Status: Normal   Collection Time   02/03/11  6:38 AM      Component Value Range   MRSA by PCR NEGATIVE  NEGATIVE   LIPID PANEL     Status: Normal   Collection Time   02/03/11  8:25 AM      Component Value Range   Cholesterol 142  0 - 200 (mg/dL)   Triglycerides 77  <244 (mg/dL)   HDL 66  >01 (mg/dL)   Total CHOL/HDL Ratio 2.2     VLDL 15  0 - 40 (mg/dL)   LDL Cholesterol 61  0 - 99 (mg/dL)    Imaging results:  No results found.  Assessment and Plan: I agree with the formulated Assessment and Plan with the following changes: 1. Angioedema - ACEI has been D/C'd and entered as an allergy. He has been instructed never to take ACEI again. The evidence to support the use of steroids, H2B, and benedryl  is weak. Therefore we will D/C on very short steroid taper only.   2. HTN - HCTZ only. F/U 1-2 weeks for BMP.  3. DMII - diet controlled. We will repeat FLP as most recent was random and triglycerides were elevated. F/U as outpt  4. Pancreatic mass - Dr Gilford Rile was a treatment plan in place already.  Pt is stable for D/C to home with oupt F/U.

## 2011-02-03 NOTE — Progress Notes (Signed)
Subjective: No adverse events overnight. Pt notes tongue and pharynx were mildly uncomfortable last night but that has resolved. He denies shortness of breath, chest pain, or pain elsewhere. Pt inquires about pain control at discharge for occasional abdominal pain that he believes may be due to pancreatitis.  He has tried ibuprofen in the past with no relief and may have occasional GI irritation from NSAIDs.  On discharge, pt plans on asking his niece if he can stay with her a few days, otherwise he will most likely go to the streets or a shelter. He is concerned about transportation to f/u appointments and the pharmacy and is wondering if we can provide bus tickets.  Objective: Vital signs in last 24 hours: Filed Vitals:   02/03/11 0300 02/03/11 0500 02/03/11 0700 02/03/11 0812  BP:  121/79 105/68 129/85  Pulse: 80 82  79  Temp:  97.5 F (36.4 C)  96.1 F (35.6 C)  TempSrc:  Oral  Axillary  Resp:  18  20  Height:      Weight:      SpO2:  100%  100%    Intake/Output Summary (Last 24 hours) at 02/03/11 1117 Last data filed at 02/03/11 0800  Gross per 24 hour  Intake      0 ml  Output      0 ml  Net      0 ml   Physical Exam: General: alert, cooperative, no apparent distress HEENT: atraumatic; moist mucous membranes; eyes without erythema or drainage; very mild edema of right lip and buccal mucosa, no edema of tongue or pharynx, no periorbital edema Neck: without masses or palpable lymphadenopathy CV: RRR, no murmurs, rubs, or gallops Pulm: CTAB, no wheezes, crackles, or rhonchi Abdomen: NT, ND, BS+ Extremities: warm and well-perfused; no pedal edema Neuro: alert and oriented x3, CN II-XII grossly intact  Lab Results: CMP     Component Value Date/Time   NA 139 02/03/2011 0401   K 3.9 02/03/2011 0401   CL 103 02/03/2011 0401   CO2 27 02/03/2011 0401   GLUCOSE 187* 02/03/2011 0401   BUN 13 02/03/2011 0401   CREATININE 0.70 02/03/2011 0401   CREATININE 0.83 01/06/2011 1551   CALCIUM 9.5 02/03/2011 0401   PROT 7.4 02/02/2011 1737   ALBUMIN 3.4* 02/02/2011 1737   AST 29 02/02/2011 1737   ALT 53 02/02/2011 1737   ALKPHOS 132* 02/02/2011 1737   BILITOT 0.6 02/02/2011 1737   GFRNONAA >90 02/03/2011 0401   GFRAA >90 02/03/2011 0401     CBC    Component Value Date/Time   WBC 8.1 02/03/2011 0401   RBC 4.32 02/03/2011 0401   HGB 14.1 02/03/2011 0401   HCT 39.8 02/03/2011 0401   PLT 168 02/03/2011 0401   MCV 92.1 02/03/2011 0401   MCH 32.6 02/03/2011 0401   MCHC 35.4 02/03/2011 0401   RDW 15.2 02/03/2011 0401   LYMPHSABS 1.2 12/02/2010 1721   MONOABS 1.0 12/02/2010 1721   EOSABS 0.0 12/02/2010 1721   BASOSABS 0.0 12/02/2010 1721   Lipid Panel     Component Value Date/Time   CHOL 142 02/03/2011 0825   TRIG 77 02/03/2011 0825   HDL 66 02/03/2011 0825   CHOLHDL 2.2 02/03/2011 0825   VLDL 15 02/03/2011 0825   LDLCALC 61 02/03/2011 0825     Micro Results: Recent Results (from the past 240 hour(s))  MRSA PCR SCREENING     Status: Normal   Collection Time   02/03/11  6:38 AM  Component Value Range Status Comment   MRSA by PCR NEGATIVE  NEGATIVE  Final    Medications: I have reviewed the patient's current medications. Scheduled Meds:   . diphenhydrAMINE  25 mg Intravenous Once  . diphenhydrAMINE  25 mg Intravenous BID  . famotidine (PEPCID) IV  20 mg Intravenous Once  . famotidine (PEPCID) IV  20 mg Intravenous Q12H  . heparin  5,000 Units Subcutaneous Q8H  . hydrochlorothiazide  25 mg Oral Daily  . methylPREDNISolone (SOLU-MEDROL) injection  125 mg Intravenous Once  . methylPREDNISolone (SOLU-MEDROL) injection  60 mg Intravenous Q6H   Continuous Infusions:  PRN Meds:.sodium chloride, traMADol Assessment/Plan: 56yo male with history of hypertension, diabetes, and pancreatitis who presented with angioedema after recently initiating ACE-inhibitor therapy.  ACE-inhibitor has been discontinued, and pt now has very mild angioedema following methylprednisone and  diphenhydramine therapy.  Principal problem: 1) Angioedema - Very likely due to ACE-inhibitor allergy. Lisinopril discontinued, and pt has been treated with methylprednisone and diphenhydramine with good results.  His angioedema is almost completely resolved, with no concern for dysphagia or difficulty breathing at this point.  The pt is ready for discharge, likely today. He now tolerates p.o. Intake well. --May transition to carb modified diet as he no longer has pharyngeal edema --May be discharged on p.o. medications, including a prednisone taper and diphenhydramine.  2) Hypertension - Adequately controlled this admission since discontinuing lisinopril-HCTZ. Restarted HCTZ 25mg  po daily and will continue on discharge.  Hypertension will be further managed as an outpatient in our clinic.  3) Diabetes - Most recent A1c was 6.7 on 11/13/10, and pt is managed with diet alone. His glucose this admission has been elevated, which is to be expected given steroid therapy.  Fasting lipid panel this AM was normal. Will f/u in clinic.  4) Pancreatic mass - Will f/u out outpatient basis in clinic.  5) Disposition - Patient intermittently homeless and has difficulty with transportation.  Social Work has been consulted.  5) DVT PPX - heparin   LOS: 1 day   Perjar, Irina 02/03/2011, 11:17 AM  I saw and evaluated the patient with Ms3 I. Perjar, agree with above excellent note.

## 2011-02-03 NOTE — Progress Notes (Signed)
Patient is discharged to home after Dr. Gilford Rile and his colleagues talked to him.  Prescriptions for Diphenhydramine and HCTZ and Prednisone given to patient.  Emphasized to patient that he needs to follow his prescription meds and follow-up appointment.  Patient verbalized understanding.  Discharged to home in stable condition. Denies pain.
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
## 2011-02-08 ENCOUNTER — Ambulatory Visit (INDEPENDENT_AMBULATORY_CARE_PROVIDER_SITE_OTHER): Payer: Self-pay | Admitting: Internal Medicine

## 2011-02-08 ENCOUNTER — Encounter: Payer: Self-pay | Admitting: Internal Medicine

## 2011-02-08 VITALS — BP 146/95 | HR 81 | Temp 97.5°F | Ht 65.0 in | Wt 200.4 lb

## 2011-02-08 DIAGNOSIS — Z658 Other specified problems related to psychosocial circumstances: Secondary | ICD-10-CM

## 2011-02-08 DIAGNOSIS — Z733 Stress, not elsewhere classified: Secondary | ICD-10-CM

## 2011-02-08 DIAGNOSIS — K8689 Other specified diseases of pancreas: Secondary | ICD-10-CM

## 2011-02-08 DIAGNOSIS — IMO0002 Reserved for concepts with insufficient information to code with codable children: Secondary | ICD-10-CM

## 2011-02-08 DIAGNOSIS — T783XXA Angioneurotic edema, initial encounter: Secondary | ICD-10-CM

## 2011-02-08 DIAGNOSIS — K869 Disease of pancreas, unspecified: Secondary | ICD-10-CM

## 2011-02-08 DIAGNOSIS — IMO0001 Reserved for inherently not codable concepts without codable children: Secondary | ICD-10-CM

## 2011-02-08 DIAGNOSIS — F1020 Alcohol dependence, uncomplicated: Secondary | ICD-10-CM

## 2011-02-08 DIAGNOSIS — I10 Essential (primary) hypertension: Secondary | ICD-10-CM

## 2011-02-08 DIAGNOSIS — E1165 Type 2 diabetes mellitus with hyperglycemia: Secondary | ICD-10-CM

## 2011-02-08 LAB — GLUCOSE, CAPILLARY: Glucose-Capillary: 118 mg/dL — ABNORMAL HIGH (ref 70–99)

## 2011-02-08 LAB — POCT GLYCOSYLATED HEMOGLOBIN (HGB A1C): Hemoglobin A1C: 6.6

## 2011-02-08 MED ORDER — HYDROCHLOROTHIAZIDE 25 MG PO TABS: 25.0000 mg | ORAL_TABLET | Freq: Every day | ORAL | Status: DC

## 2011-02-08 NOTE — Assessment & Plan Note (Signed)
Patient living with his niece when he can. He does not have housing beyond that. He did just apply for disability and SSI. At followup can look into further housing options.

## 2011-02-08 NOTE — Progress Notes (Signed)
Subjective:     Patient ID: Michael [name], male   DOB: 1955/09/25, 56 y.o.   MRN: [id number]  HPI Patient is a 56 year old man with hypertension, alcoholism, marginal housing, chronic pancreatitis, and suspicious pancreatic findings on MRCP who is here for hospital followup. He was hospitalized last week for her angioedema after taking lisinopril/HCTZ. He was watched overnight and did well with medical therapy. He was discharged with Benadryl and prednisone taper. He has not been taking the prednisone properly. He still complains of some left-sided jaw and facial swelling. He has not been taking his HCTZ either, which was his discharge blood pressure control medicine.  Patient says he is having 1-2 beers every 2-3 days, which is a big improvement for him. He does complain of left-sided abdominal pain intermittently.  He is currently staying with his niece on and off. He did apply for and get the orange card. He is also applying for SSI and disability--he brought his applications which were filed yesterday.  He has been plugged into the [place] system. He sees a Veterinary surgeon every week, last seen on 1/for/13. He is an appointment on 02/17/11 with a psychiatrist.  Regarding his pancreas findings on MRCP, he was supposed to get in to see Dr. Dulce Sellar, but do not have the orange card and missed multiple appointments. He goes GI no longer wishes to see him.  Review of Systems As per hpi    Objective:   Physical Exam GEN: NAD.  Alert and oriented x 3.  Pleasant, conversant, and cooperative to exam.  Pressured speech but appropriate. RESP:  CTAB, no w/r/r CARDIOVASCULAR: RRR, S1, S2, no m/r/g ABDOMEN: soft, NT/ND, NABS EXT: warm and dry. No edema in b/l LE     Assessment:         Plan:

## 2011-02-08 NOTE — Assessment & Plan Note (Addendum)
After episode of angioedema with HCTZ/lisinopril, patient was not taking blood pressure medicines. I assured him that he could take HCTZ only safely. He said he will start taking it once daily. Patient did have protein in his urine, but cannot prescribe lisinopril or ARB. Constantly, controlling his blood pressure and his diabetes will be our next best options. - Check BMP next visit 4 electrolytes if compliant with HCTZ

## 2011-02-08 NOTE — Assessment & Plan Note (Addendum)
Multiple GI referrals for this issue have been unsuccessful because of lack of orange card and missed appointments. The patient now has the orange card, has been attending his appointments, so we have referred to [facility] as opposed to [facility]. He has an appointment on February 6 at 10:30 AM. We will encourage him to attend this appointment for further workup of the MRCP findings. As was stated in prior notes, this is most likely a benign stricture secondary to chronic pancreatitis. However, it could be an early forming adenocarcinoma so endoscopic ultrasound with possible biopsy was raised as a possible investigation.

## 2011-02-08 NOTE — Assessment & Plan Note (Signed)
Patient was seen in the hospital last week for angioedema of the face after taking lisinopril. He recovered well, but still has facial swelling. He was not taking his prednisone taper as prescribed. I explained to him how they should be taken, and he voiced understanding. He will take a prednisone taper starting today.

## 2011-02-08 NOTE — Assessment & Plan Note (Signed)
The patient drinking one to 2 beers every 2-3 days. I encouraged him to continue reducing the amount. He is attending sessions at [facility], and will see a psychiatrist on 02/17/11

## 2011-02-08 NOTE — Assessment & Plan Note (Addendum)
A1c today is 6.6. We will not start any diabetes medicines. We will continue to treat his blood pressure. His lipid panel was excellent from 5 days ago. We will focus on his mental health issues, his undiagnosed GI findings, and his blood pressure as we continue to integrate them into the medical system. - Consider eye examin in future

## 2011-02-10 NOTE — Progress Notes (Signed)
I agree with Dr. Wildman-Tobriner's assessment and plan. 

## 2011-02-23 ENCOUNTER — Encounter: Payer: Self-pay | Admitting: Internal Medicine

## 2011-02-23 ENCOUNTER — Ambulatory Visit (INDEPENDENT_AMBULATORY_CARE_PROVIDER_SITE_OTHER): Payer: Self-pay | Admitting: Internal Medicine

## 2011-02-23 DIAGNOSIS — E1165 Type 2 diabetes mellitus with hyperglycemia: Secondary | ICD-10-CM

## 2011-02-23 DIAGNOSIS — F1020 Alcohol dependence, uncomplicated: Secondary | ICD-10-CM

## 2011-02-23 DIAGNOSIS — Z658 Other specified problems related to psychosocial circumstances: Secondary | ICD-10-CM

## 2011-02-23 DIAGNOSIS — K8689 Other specified diseases of pancreas: Secondary | ICD-10-CM

## 2011-02-23 DIAGNOSIS — K869 Disease of pancreas, unspecified: Secondary | ICD-10-CM

## 2011-02-23 DIAGNOSIS — I10 Essential (primary) hypertension: Secondary | ICD-10-CM

## 2011-02-23 DIAGNOSIS — IMO0002 Reserved for concepts with insufficient information to code with codable children: Secondary | ICD-10-CM

## 2011-02-23 DIAGNOSIS — IMO0001 Reserved for inherently not codable concepts without codable children: Secondary | ICD-10-CM

## 2011-02-23 DIAGNOSIS — Z733 Stress, not elsewhere classified: Secondary | ICD-10-CM

## 2011-02-23 NOTE — Assessment & Plan Note (Signed)
Patient continues to drink, but seems to be only 1-4 drinks daily. We discussed how alcohol is damaging to the pancreas and the importance of continuing to keep his drinking and a minimum. He continues to attend [facility] appointments, and has an appointment with a psychiatrist on 03/03/11. His appointment earlier this week was with a counselor Brett Canales.

## 2011-02-23 NOTE — Progress Notes (Signed)
Subjective:     Patient ID: JAVONTAY VANDAM, male   DOB: 09-30-1955, 56 y.o.   MRN: [id number]  HPI Patient is a 56 year old gentleman with a history of alcoholism, gastritis, pancreas mass, and hypertension who is here for followup. The patient is doing well, but he continues to have marginal housing. He has been sitting with his niece, sister, and at a local church.  He has questionable compliance with his HCTZ, but he reports no further swelling indicative of angioedema.  He continues to see [facility] on a weekly basis. He admits to having difficulty controlling his temper with some members of his family and people who he sometimes stays with.  He is a little vague on how much he is drinking, but he says "not happy", and it seems to be anywhere from one to 3 drinks per day.  Review of Systems No other complaints    Objective:   Physical Exam Gen: well dressed, well nourished, appropriate Psych: appropriate and logical, fast/pressured speech, but not tangential or circumferential.  Appropriate mood/affect.    Assessment:         Plan:

## 2011-02-23 NOTE — Assessment & Plan Note (Signed)
Patient has an appointment on 03/01/11 with [facility]. He is aware of this appointment. Please see prior note for further details on this problem.

## 2011-02-23 NOTE — Assessment & Plan Note (Signed)
A1c last measured in the middle of January. Recheck A1c in the beginning or middle of April. He is not on any medicines at this time because his last A1c was 6.6. We will continue to treat his blood pressure and focus on his mental health, housing, and GI issues. - Consider eye exam in future: Do not want to overwhelm the patient was given the appointment right now - Lipids under control - BP getting treated - Consider starting baby aspirin Overall, this patient could have better diabetes management, but with his marginal housing and alcoholism, I think we are doing right now is reasonable the time being.

## 2011-02-23 NOTE — Assessment & Plan Note (Signed)
Patient did not bring bottle of HCTZ today. Is not clear to me if he is taking it or not, but his BP was near goal today. I asked him to bring his meds to his next appointment. - Check BMP at next visit If compliant with HCTZ

## 2011-02-23 NOTE — Assessment & Plan Note (Signed)
Patient currently living with his niece, sister, or at church housing. He cycles between various housing options. Considering his marginal housing, he is doing well overall. We follow him closely, every few weeks, to make sure things are going okay for him. He does usually attend his appointments.

## 2011-02-24 NOTE — Progress Notes (Signed)
agree

## 2011-03-01 ENCOUNTER — Ambulatory Visit (AMBULATORY_SURGERY_CENTER): Payer: Self-pay | Admitting: *Deleted

## 2011-03-01 VITALS — Ht 65.0 in | Wt 194.8 lb

## 2011-03-01 DIAGNOSIS — Z1211 Encounter for screening for malignant neoplasm of colon: Secondary | ICD-10-CM

## 2011-03-01 NOTE — Progress Notes (Signed)
Pt scheduled for direct colonoscopy with Dr. Leone Payor on 03/15/2011.  Here today for PV. I have reviewed pt's chart and see that pt should have been scheduled for OV with Dr. Leone Payor instead of direct colonoscopy.  Office Visit  with Dr. Kathreen Cosier on 02/08/2011 says that patient needs GI referral for pancreatic mass.  New patient appointment scheduled with Dr. Leone Payor on 2/19 at 8:45 and colonoscopy cancelled.  Michael [name]

## 2011-03-14 ENCOUNTER — Ambulatory Visit (INDEPENDENT_AMBULATORY_CARE_PROVIDER_SITE_OTHER): Payer: Self-pay | Admitting: Internal Medicine

## 2011-03-14 ENCOUNTER — Encounter: Payer: Self-pay | Admitting: Internal Medicine

## 2011-03-14 ENCOUNTER — Other Ambulatory Visit (INDEPENDENT_AMBULATORY_CARE_PROVIDER_SITE_OTHER): Payer: Self-pay

## 2011-03-14 DIAGNOSIS — K869 Disease of pancreas, unspecified: Secondary | ICD-10-CM

## 2011-03-14 DIAGNOSIS — R1319 Other dysphagia: Secondary | ICD-10-CM

## 2011-03-14 DIAGNOSIS — R197 Diarrhea, unspecified: Secondary | ICD-10-CM

## 2011-03-14 DIAGNOSIS — K8689 Other specified diseases of pancreas: Secondary | ICD-10-CM

## 2011-03-14 DIAGNOSIS — F1020 Alcohol dependence, uncomplicated: Secondary | ICD-10-CM

## 2011-03-14 LAB — COMPREHENSIVE METABOLIC PANEL
ALT: 44 U/L (ref 0–53)
AST: 45 U/L — ABNORMAL HIGH (ref 0–37)
Chloride: 104 mEq/L (ref 96–112)
Creatinine, Ser: 0.8 mg/dL (ref 0.4–1.5)
Sodium: 143 mEq/L (ref 135–145)
Total Bilirubin: 0.6 mg/dL (ref 0.3–1.2)

## 2011-03-14 LAB — CBC WITH DIFFERENTIAL/PLATELET
Basophils Absolute: 0 10*3/uL (ref 0.0–0.1)
Eosinophils Absolute: 0.1 10*3/uL (ref 0.0–0.7)
Hemoglobin: 15.1 g/dL (ref 13.0–17.0)
Lymphocytes Relative: 13 % (ref 12.0–46.0)
MCHC: 34.4 g/dL (ref 30.0–36.0)
Neutro Abs: 8.3 10*3/uL — ABNORMAL HIGH (ref 1.4–7.7)
Platelets: 180 10*3/uL (ref 150.0–400.0)
RDW: 14.6 % (ref 11.5–14.6)

## 2011-03-14 LAB — PROTIME-INR: INR: 1.2 ratio — ABNORMAL HIGH (ref 0.8–1.0)

## 2011-03-14 MED ORDER — OMEPRAZOLE 20 MG PO CPDR: 20.0000 mg | DELAYED_RELEASE_CAPSULE | Freq: Every day | ORAL | Status: DC

## 2011-03-14 NOTE — Assessment & Plan Note (Signed)
Is going to need upper endoscopy and esophageal dilation most likely. I have explained this to him including the risks benefits and indications. This will need to be done before endoscopic ultrasound was performed. We'll start omeprazole 20 mg daily.

## 2011-03-14 NOTE — Assessment & Plan Note (Addendum)
Not so much a mass but stricture with focal pancreatic duct dilation. Could be underlying tumor vs. Chronic inflammation. Endoscopic ultrasound with Dr. Christella Hartigan has been arranged. Risks, benefits and indications explained.

## 2011-03-14 NOTE — Assessment & Plan Note (Signed)
Advised to stop EtOH completely

## 2011-03-14 NOTE — Progress Notes (Signed)
Subjective:    Patient ID: Michael [name], male    DOB: 11/09/1955, 56 y.o.   MRN: [id number]  HPI This 56-year-old African American man presents for evaluation of several problems. He was actually sent for a screening colonoscopy. When seen by the nurses he was directed to an office visit.  In October, he had abdominal imaging the CT scan MR that ended up showing a dilated pancreatic duct in the tail and a suspected stricture from MRCP. He was to have an endoscopic ultrasound arranged, but was having living situation difficulties and did not have any insurance or health care support at the time and was unable to do so. He now has what he calls an orange card that helps take for his care.  He reports solid intermittent dysphagia for the last one to 2 years. A midsternal sticking point. He has some heartburn. There is a constant left upper quadrant pain and bubbling and pressure sensation.  He's also had several years of urgent loose defecation, not usually nocturnal. He will have some normal stools as well. There's been no progressive weight loss. He is not describing orange greasy stools he says they look normal and [place].  Allergies  Allergen Reactions  . Ace Inhibitors Swelling    Angioedema - unquestionable  . Losartan Swelling    Pt presented with unquestionable angioedema on ACEI, so aviod ARBs as well   Outpatient Prescriptions Prior to Visit  Medication Sig Dispense Refill  . hydrochlorothiazide (HYDRODIURIL) 25 MG tablet Take 1 tablet (25 mg total) by mouth daily.  31 tablet  3   Past Medical History  Diagnosis Date  . Pancreatitis   . Smoker   . Diabetes mellitus     diet controlled  . Gastritis due to alcohol without hemorrhage   . HTN (hypertension)   . Pancreatic lesion   . Heavy alcohol use     hx of  . Marijuana use     hx of  . Migraine    Past Surgical History  Procedure Date  . No past surgeries    History   Social History  . Marital Status: Single   Spouse Name: N/A    Number of Children: 0  . Years of Education: N/A   Occupational History  . 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 maintenance [employer]   Social History Main Topics  . Smoking status: Current Everyday Smoker -- 0.5 packs/day    Types: Cigarettes  . Smokeless tobacco: Never Used   Comment: info given 03/14/2011  . Alcohol Use: Yes     Drinks 1-2 40 oz beers daily - says not everyday  . Drug Use: Yes    Special: Marijuana    Social History Narrative   Has been living at 3 different friend's homes since discharge 01/2011. Was living in the shelter prior to admission. Had previously lived with his niece and other family members but he has gotten kicked out each time.    Family History  Problem Relation Age of Onset  . Colon cancer Neg Hx   . Stomach cancer Neg Hx   . Hypertension Sister   . Diabetes Sister   . Heart disease Sister        Review of Systems Wt Readings from Last 3 Encounters:  03/14/11 194 lb (87.998 kg)  03/01/11 194 lb 12.8 oz (88.361 kg)  02/23/11   198 lb (89.812 kg)   believes he has some excessive urination. Denies fever, progressive appetite loss or change, chest pain or dyspnea. He is dissipating and ongoing care at the [facility] for his depression and mental illness. He was in the hospital in January 2013 with angioedema thought to to an ACE inhibitor.    Objective:   Physical Exam General:  Well-developed, well-nourished and in no acute distress overweight to obese Eyes:  anicteric. Bilateral arcus ENT:   Mouth and posterior pharynx free of lesions. There is one tooth remaining in the left upper jaw line that is in very poor repair Neck:   supple w/o thyromegaly or mass.  Lungs: Clear to auscultation bilaterally. Heart:  S1S2, no rubs, murmurs, gallops. Abdomen:  soft, non-tender, no hepatosplenomegaly, hernia, or mass and BS+.  Lymph:  no cervical or supraclavicular  adenopathy. Extremities:   no edema Psych:  Talkative, cooperative, somewhat rapid speech at times. Answers questions appropriately overall the last be redirected.   Data Reviewed: I have ordered CBC, metabolic panel and coags given his drinking history and need for invasive procedures.       Assessment & Plan:   

## 2011-03-14 NOTE — Patient Instructions (Addendum)
Please go to the basement upon leaving today to have your labs done. Please stop drinking alcohol completely. Always bring all your medications with you to your office visits. You have been given a printed prescription for omeprazole to take with you to your pharmacy, please take as directed. You have been scheduled for a Endoscopy with Dilation at [facility] on 03/22/11 @ 8:30 am please arrive at 7:30. You have been scheduled for a EUS appointment at [facility] on 04/20/11 @ 10:30 am please arrive at 8:30 am.

## 2011-03-15 ENCOUNTER — Other Ambulatory Visit: Payer: Self-pay | Admitting: Internal Medicine

## 2011-03-15 NOTE — Assessment & Plan Note (Signed)
Cause not clear. Does not sound like pancreatic insufficiency but possible. EtOH, IBS, IBD possible. Needs work-up of dysphagia (and treatment) and pancreas before colonoscopy.

## 2011-03-17 ENCOUNTER — Encounter: Payer: Self-pay | Admitting: Ophthalmology

## 2011-03-22 ENCOUNTER — Encounter (HOSPITAL_COMMUNITY): Payer: Self-pay

## 2011-03-22 ENCOUNTER — Encounter (HOSPITAL_COMMUNITY)
Admission: RE | Disposition: A | Discharge status: Home / Self Care | Payer: Self-pay | Source: Ambulatory Visit | Attending: Internal Medicine

## 2011-03-22 ENCOUNTER — Ambulatory Visit (HOSPITAL_COMMUNITY)
Admission: RE | Admit: 2011-03-22 | Discharge: 2011-03-22 | Disposition: A | Discharge status: Home / Self Care | Payer: Self-pay | Source: Ambulatory Visit | Attending: Internal Medicine | Admitting: Internal Medicine

## 2011-03-22 DIAGNOSIS — K2289 Other specified disease of esophagus: Secondary | ICD-10-CM | POA: Insufficient documentation

## 2011-03-22 DIAGNOSIS — K2970 Gastritis, unspecified, without bleeding: Secondary | ICD-10-CM

## 2011-03-22 DIAGNOSIS — R609 Edema, unspecified: Secondary | ICD-10-CM | POA: Insufficient documentation

## 2011-03-22 DIAGNOSIS — K2960 Other gastritis without bleeding: Secondary | ICD-10-CM | POA: Insufficient documentation

## 2011-03-22 DIAGNOSIS — K228 Other specified diseases of esophagus: Secondary | ICD-10-CM | POA: Insufficient documentation

## 2011-03-22 DIAGNOSIS — I10 Essential (primary) hypertension: Secondary | ICD-10-CM | POA: Insufficient documentation

## 2011-03-22 DIAGNOSIS — R1319 Other dysphagia: Secondary | ICD-10-CM

## 2011-03-22 DIAGNOSIS — E119 Type 2 diabetes mellitus without complications: Secondary | ICD-10-CM | POA: Insufficient documentation

## 2011-03-22 DIAGNOSIS — A048 Other specified bacterial intestinal infections: Secondary | ICD-10-CM | POA: Insufficient documentation

## 2011-03-22 DIAGNOSIS — L539 Erythematous condition, unspecified: Secondary | ICD-10-CM | POA: Insufficient documentation

## 2011-03-22 DIAGNOSIS — R1310 Dysphagia, unspecified: Secondary | ICD-10-CM | POA: Insufficient documentation

## 2011-03-22 DIAGNOSIS — K319 Disease of stomach and duodenum, unspecified: Secondary | ICD-10-CM | POA: Insufficient documentation

## 2011-03-22 DIAGNOSIS — Z79899 Other long term (current) drug therapy: Secondary | ICD-10-CM | POA: Insufficient documentation

## 2011-03-22 HISTORY — PX: ESOPHAGOGASTRODUODENOSCOPY: SHX5428

## 2011-03-22 HISTORY — PX: BALLOON DILATION: SHX5330

## 2011-03-22 SURGERY — EGD (ESOPHAGOGASTRODUODENOSCOPY)
Anesthesia: Moderate Sedation

## 2011-03-22 MED ORDER — DIPHENHYDRAMINE HCL 50 MG/ML IJ SOLN
INTRAMUSCULAR | Status: AC
  Filled 2011-03-22: qty 1

## 2011-03-22 MED ORDER — MIDAZOLAM HCL 10 MG/2ML IJ SOLN
INTRAMUSCULAR | Status: DC | PRN
  Administered 2011-03-22: 2 mg via INTRAVENOUS
  Administered 2011-03-22: 1 mg via INTRAVENOUS
  Administered 2011-03-22 (×2): 2 mg via INTRAVENOUS

## 2011-03-22 MED ORDER — FENTANYL NICU IV SYRINGE 50 MCG/ML
INJECTION | INTRAMUSCULAR | Status: DC | PRN
  Administered 2011-03-22 (×2): 25 ug via INTRAVENOUS

## 2011-03-22 MED ORDER — FENTANYL CITRATE 0.05 MG/ML IJ SOLN
INTRAMUSCULAR | Status: AC
  Filled 2011-03-22: qty 2

## 2011-03-22 MED ORDER — BUTAMBEN-TETRACAINE-BENZOCAINE 2-2-14 % EX AERO
INHALATION_SPRAY | CUTANEOUS | Status: DC | PRN
  Administered 2011-03-22: 2 via TOPICAL

## 2011-03-22 MED ORDER — DIPHENHYDRAMINE HCL 50 MG/ML IJ SOLN
INTRAMUSCULAR | Status: DC | PRN
  Administered 2011-03-22 (×2): 12.5 mg via INTRAVENOUS

## 2011-03-22 MED ORDER — MIDAZOLAM HCL 10 MG/2ML IJ SOLN
INTRAMUSCULAR | Status: AC
  Filled 2011-03-22: qty 2

## 2011-03-22 MED ORDER — SODIUM CHLORIDE 0.9 % IV SOLN
INTRAVENOUS | Status: DC
  Administered 2011-03-22: 08:00:00 via INTRAVENOUS

## 2011-03-22 MED ORDER — MIDAZOLAM HCL 2 MG/2ML IJ SOLN
INTRAMUSCULAR | Status: AC
  Filled 2011-03-22: qty 2

## 2011-03-22 NOTE — Op Note (Signed)
[facility] [address] [place], [place]  [address]  ENDOSCOPY PROCEDURE REPORT  PATIENT:  Michael [name], Michael [name]  MR#:  [id number] BIRTHDATE:  1955/08/15, 55 yrs. old  GENDER:  male  ENDOSCOPIST:  [name], MD, [facility] Referred by:          [facility]  PROCEDURE DATE:  03/22/2011 PROCEDURE:  EGD with biopsy, [id number], Elease Hashimoto Dilation ASA CLASS:  Class II INDICATIONS:  dysphagia  MEDICATIONS:   Benadryl 25 mg IV, Versed 7 mg IV, Fentanyl 50 gm IV TOPICAL ANESTHETIC:  Cetacaine Spray  DESCRIPTION OF PROCEDURE:   After the risks benefits and alternatives of the procedure were thoroughly explained, informed consent was obtained.  The Pentax Gastroscope [id number] endoscope was introduced through the mouth and advanced to the second portion of the duodenum, without limitations.  The instrument was slowly withdrawn as the mucosa was fully examined. <<PROCEDUREIMAGES>>  It was tortuous in the total esophagus. Mild.  A nodule was found in the antrum. 3-4 mm - removed with biopsy forceps.  Erythema was found pyloric channel Edematous and erythematous folds in pyloric channel. Biopsied.  Otherwise the examination was normal. Retroflexed views revealed no abnormalities.    The scope was then withdrawn from the patient, a 85 [place] maloney dilator was passed without difficulty but trace heme, and the procedure completed.  COMPLICATIONS:  None  ENDOSCOPIC IMPRESSION: 1) Tortuous in the total esophagus - mild. Dilated 54 Fr. Suspect lack of teeth part of dysphagia problem. 2) Nodule in the antrum 3) Erythema/edema in the pyloric channel 4) Otherwise normal examination RECOMMENDATIONS: 1) Await biopsy results 2) EUS 3/28 (Dr. Christella Hartigan) to evaluate pancreatic abnormality (? stricture/mass) 3) Stay on omeprazole 4) Stop all alcohol 5) Consider colonoscopy pending EUS results  [name], MD, [facility]  CC:  The Patient Wendall Papa. MD Kathreen Cosier, MD  n. eSIGNED:   [name] at 03/22/2011 09:13 AM  Jarome Matin, [phone]

## 2011-03-22 NOTE — Discharge Instructions (Addendum)
The esophagus was dilated today. Please take only clear liquids until 11 AM then try soft foods. Try more normal foods beginning tomorrow. You should eat softer foods anyway since you do not have any teeth. There were two other abnormalities seen that I think are some inflammation in the stomach - biopsies were taken to understand. Continue taking omeprazole and stop alcohol if you haven't. Your EUS (endoscopic ultrasound) to check the pancreas is back here on 04/20/11.Endoscopy Care After Please read the instructions outlined below and refer to this sheet in the next few weeks. These discharge instructions provide you with general information on caring for yourself after you leave the hospital. Your doctor may also give you specific instructions. While your treatment has been planned according to the most current medical practices available, unavoidable complications occasionally occur. If you have any problems or questions after discharge, please call your doctor. HOME CARE INSTRUCTIONS Activity  You may resume your regular activity but move at a slower pace for the next 24 hours.   Take frequent rest periods for the next 24 hours.   Walking will help expel (get rid of) the air and reduce the bloated feeling in your abdomen.   No driving for 24 hours (because of the anesthesia (medicine) used during the test).   You may shower.   Do not sign any important legal documents or operate any machinery for 24 hours (because of the anesthesia used during the test).  Nutrition  Drink plenty of fluids.   You may resume your normal diet.   Begin with a light meal and progress to your normal diet.   Avoid alcoholic beverages for 24 hours or as instructed by your caregiver.  Medications You may resume your normal medications unless your caregiver tells you otherwise. What you can expect today  You may experience abdominal discomfort such as a feeling of fullness or "gas" pains.   You may  experience a sore throat for 2 to 3 days. This is normal. Gargling with salt water may help this.  Follow-up Your doctor will discuss the results of your test with you. SEEK IMMEDIATE MEDICAL CARE IF:  You have excessive nausea (feeling sick to your stomach) and/or vomiting.   You have severe abdominal pain and distention (swelling).   You have trouble swallowing.   You have a temperature over 100 F (37.8 C).   You have rectal bleeding or vomiting of blood.  Document Released: 08/24/2003 Document Revised: 09/21/2010 Document Reviewed: 03/06/2007 [facility] Patient Information 2012 [place], [place].

## 2011-03-22 NOTE — Interval H&P Note (Signed)
History and Physical Interval Note:  03/22/2011 8:31 AM  Michael [name]  has presented today for surgery, with the diagnosis of dysphagia  The various methods of treatment have been discussed with the patient and family. After consideration of risks, benefits and other options for treatment, the patient has consented to  Procedure(s) (LRB): ESOPHAGOGASTRODUODENOSCOPY (EGD) (N/A) BALLOON DILATION (N/A) as a surgical intervention .  The patients' history has been reviewed, patient examined, no change in status, stable for surgery.  I have reviewed the patients' chart and labs.  Questions were answered to the patient's satisfaction.     Stan Head

## 2011-03-22 NOTE — H&P (View-Only) (Signed)
Subjective:    Patient ID: Michael [name], male    DOB: 08-11-1955, 56 y.o.   MRN: [id number]  HPI This 56 year old [place] American man presents for evaluation of several problems. He was actually sent for a screening colonoscopy. When seen by the nurses he was directed to an office visit.  In October, he had abdominal imaging the CT scan MR that ended up showing a dilated pancreatic duct in the tail and a suspected stricture from MRCP. He was to have an endoscopic ultrasound arranged, but was having living situation difficulties and did not have any insurance or health care support at the time and was unable to do so. He now has what he calls an orange card that helps take for his care.  He reports solid intermittent dysphagia for the last one to 2 years. A midsternal sticking point. He has some heartburn. There is a constant left upper quadrant pain and bubbling and pressure sensation.  He's also had several years of urgent loose defecation, not usually nocturnal. He will have some normal stools as well. There's been no progressive weight loss. He is not describing orange greasy stools he says they look normal and [place].  Allergies  Allergen Reactions  . Ace Inhibitors Swelling    Angioedema - unquestionable  . Losartan Swelling    Pt presented with unquestionable angioedema on ACEI, so aviod ARBs as well   Outpatient Prescriptions Prior to Visit  Medication Sig Dispense Refill  . hydrochlorothiazide (HYDRODIURIL) 25 MG tablet Take 1 tablet (25 mg total) by mouth daily.  31 tablet  3   Past Medical History  Diagnosis Date  . Pancreatitis   . Smoker   . Diabetes mellitus     diet controlled  . Gastritis due to alcohol without hemorrhage   . HTN (hypertension)   . Pancreatic lesion   . Heavy alcohol use     hx of  . Marijuana use     hx of  . Migraine    Past Surgical History  Procedure Date  . No past surgeries    History   Social History  . Marital Status: Single   Spouse Name: N/A    Number of Children: 0  . Years of Education: N/A   Occupational History  . 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 Smoking status: Current Everyday Smoker -- 0.5 packs/day    Types: Cigarettes  . Smokeless tobacco: Never Used   Comment: info given 03/14/2011  . Alcohol Use: Yes     Drinks 1-2 40 oz beers daily - says not everyday  . Drug Use: Yes    Special: Marijuana    Social History Narrative   Has been living at 3 different friend's homes since discharge 01/2011. Was living in the shelter prior to admission. Had previously lived with his niece and other family members but he has gotten kicked out each time.    Family History  Problem Relation Age of Onset  . Colon cancer Neg Hx   . Stomach cancer Neg Hx   . Hypertension Sister   . Diabetes Sister   . Heart disease Sister        Review of Systems Wt Readings from Last 3 Encounters:  03/14/11 194 lb (87.998 kg)  03/01/11 194 lb 12.8 oz (88.361 kg)  02/23/11  198 lb (89.812 kg)   believes he has some excessive urination. Denies fever, progressive appetite loss or change, chest pain or dyspnea. He is dissipating and ongoing care at the [facility] for his depression and mental illness. He was in the hospital in January 2013 with angioedema thought to to an ACE inhibitor.    Objective:   Physical Exam General:  Well-developed, well-nourished and in no acute distress overweight to obese Eyes:  anicteric. Bilateral arcus ENT:   Mouth and posterior pharynx free of lesions. There is one tooth remaining in the left upper jaw line that is in very poor repair Neck:   supple w/o thyromegaly or mass.  Lungs: Clear to auscultation bilaterally. Heart:  S1S2, no rubs, murmurs, gallops. Abdomen:  soft, non-tender, no hepatosplenomegaly, hernia, or mass and BS+.  Lymph:  no cervical or supraclavicular  adenopathy. Extremities:   no edema Psych:  Talkative, cooperative, somewhat rapid speech at times. Answers questions appropriately overall the last be redirected.   Data Reviewed: I have ordered CBC, metabolic panel and coags given his drinking history and need for invasive procedures.       Assessment & Plan:

## 2011-03-23 ENCOUNTER — Encounter: Payer: Self-pay | Admitting: Internal Medicine

## 2011-03-23 ENCOUNTER — Encounter (HOSPITAL_COMMUNITY): Payer: Self-pay | Admitting: Internal Medicine

## 2011-03-23 NOTE — Progress Notes (Signed)
Quick Note:  H pylori + gastritis Limited resources Any chance we can get Pylera samples? He will probably need instruction in person ______

## 2011-03-27 ENCOUNTER — Telehealth: Payer: Self-pay | Admitting: Internal Medicine

## 2011-03-27 ENCOUNTER — Ambulatory Visit (INDEPENDENT_AMBULATORY_CARE_PROVIDER_SITE_OTHER): Payer: Self-pay | Admitting: Ophthalmology

## 2011-03-27 ENCOUNTER — Encounter: Payer: Self-pay | Admitting: Ophthalmology

## 2011-03-27 ENCOUNTER — Telehealth: Payer: Self-pay | Admitting: Gastroenterology

## 2011-03-27 ENCOUNTER — Other Ambulatory Visit: Payer: Self-pay

## 2011-03-27 DIAGNOSIS — B9681 Helicobacter pylori [H. pylori] as the cause of diseases classified elsewhere: Secondary | ICD-10-CM

## 2011-03-27 DIAGNOSIS — K921 Melena: Secondary | ICD-10-CM

## 2011-03-27 DIAGNOSIS — K869 Disease of pancreas, unspecified: Secondary | ICD-10-CM

## 2011-03-27 DIAGNOSIS — A048 Other specified bacterial intestinal infections: Secondary | ICD-10-CM

## 2011-03-27 DIAGNOSIS — E1165 Type 2 diabetes mellitus with hyperglycemia: Secondary | ICD-10-CM

## 2011-03-27 DIAGNOSIS — K8689 Other specified diseases of pancreas: Secondary | ICD-10-CM

## 2011-03-27 DIAGNOSIS — IMO0001 Reserved for inherently not codable concepts without codable children: Secondary | ICD-10-CM

## 2011-03-27 DIAGNOSIS — F1020 Alcohol dependence, uncomplicated: Secondary | ICD-10-CM

## 2011-03-27 DIAGNOSIS — IMO0002 Reserved for concepts with insufficient information to code with codable children: Secondary | ICD-10-CM

## 2011-03-27 MED ORDER — AMOXICILL-CLARITHRO-OMEPRAZOLE 500-500-20 MG PO MISC: 1.0000 | Freq: Every day | ORAL | Status: DC

## 2011-03-27 NOTE — Telephone Encounter (Signed)
Spoke with patient and he wants to know if he needs to have any f/u on h.pylori after he completes the antibiotics. Please, advise.

## 2011-03-27 NOTE — Assessment & Plan Note (Signed)
Patient was educated about H. Pylori. [facility] was kind enough to give him 10 days to samples to treat his H. Pylori in a less complicated and free way. He was wondering what follow up he needs after finishing the pills. I told him that he will be cured of the infection after taking the pills as prescribed.

## 2011-03-27 NOTE — Progress Notes (Signed)
Subjective:   Patient ID: Michael [name] male   DOB: 07-Apr-1955 56 y.o.   MRN: [id number]  HPI: Michael [name] is a 56 y.o. man with alcoholic and H. Pylori gastritis who presents for close follow up.   Gastritis- no vomiting. He says he had vomiting last week. He must have stopped taking the omeprazole since he restarted when he saw Dr. Leone Payor. He also was passing some BM with blood in the toilet water last week.   Alcohol- 1-2 beer a week, 12 or 24 oz. Is avoiding malt liquor with higher percentage alcohol.   Mood- has been having conflict with a lot of people lately. Is tired of dealing with attitude from members of his family. "I'm an angry person, trying to suppress it."   Housing- can go back to [employer] in 1 month. He is upset that his family members make it mandatory for him to do chores etc.   Complains of pain in his left side worse with lifting heavy objects. Has to move his BM after he lifts something. He says he used to be able to lift several hundred pounds so this bothers him.  Past Medical History  Diagnosis Date  . Pancreatitis   . Smoker   . Diabetes mellitus     diet controlled  . Gastritis due to alcohol without hemorrhage   . HTN (hypertension)   . Pancreatic lesion   . Heavy alcohol use     hx of  . Marijuana use     hx of  . Migraine   . Depression   . Helicobacter pylori gastritis 12/09/2010   Current Outpatient Prescriptions  Medication Sig Dispense Refill  . Amoxicill-Clarithro-Omeprazole (OMECLAMOX-PAK) 500-500-20 MG MISC Take 1 Package by mouth daily.  10 each  0  . FLUoxetine (PROZAC) 20 MG capsule Take 20 mg by mouth daily.      . hydrochlorothiazide (HYDRODIURIL) 25 MG tablet Take 1 tablet (25 mg total) by mouth daily.  31 tablet  3  . omeprazole (PRILOSEC) 20 MG capsule Take 1 capsule (20 mg total) by mouth daily.  30 capsule  11  . PRESCRIPTION MEDICATION Rx for depression , name unknown       Family History  Problem Relation Age  of Onset  . Colon cancer Neg Hx   . Stomach cancer Neg Hx   . Hypertension Sister   . Diabetes Sister   . Heart disease Sister    History   Social History  . Marital Status: Single    Spouse Name: N/A    Number of Children: 0  . Years of Education: N/A   Occupational History  . 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 Smoking status: Current Everyday Smoker -- 0.5 packs/day    Types: Cigarettes  . Smokeless tobacco: Never Used   Comment: info given 03/14/2011  . Alcohol Use: Yes     Drinks 1-2 40 oz beers daily - says not everyday  . Drug Use: Yes    Special: Marijuana  . Sexually Active: Not on file   Other Topics Concern  . Not on file   Social History Narrative   Has been living at 3 different friend's homes since discharge 01/2011. Was living in the shelter prior to admission. Had previously lived with his niece and other family  members but he has gotten kicked out each time.     Objective:  Physical Exam: There were no vitals filed for this visit. General: middle aged man who is slightly agitated, sitting in chair, speech is somewhat pressured HEENT: PERRL, EOMI, no scleral icterus, exotropia Cardiac: RRR, no rubs, murmurs or gallops Pulm: clear to auscultation bilaterally, moving normal volumes of air Abd: soft, nontender, nondistended, BS present, has focal area of tenderness in left flank. Ext: warm and well perfused, no pedal edema Neuro: alert and oriented X3, cranial nerves II-XII grossly intact  Assessment & Plan:

## 2011-03-27 NOTE — Assessment & Plan Note (Signed)
Patient's blood sugar was high today 231, but his A1c was last 6.6, which corresponds to a blood sugar of 143. At next visit we can ask him if he would like to meet with Norm Parcel, our diabetes educator. We can also consider starting him on metformin in the future.

## 2011-03-27 NOTE — Patient Instructions (Signed)
You have an infection of your stomach caused by a type of bacteria called H. Pylori.  -If you take the medications that they gave you every morning and every night you will be cured of this infection. -you can stop taking the omeprazole pills until you finish the 10-day packs since the pack already has omeprazole in it

## 2011-03-27 NOTE — Assessment & Plan Note (Signed)
Patient reports cutting down considerably on his alcohol use. If this is true, it is quite an improvement.

## 2011-03-27 NOTE — Assessment & Plan Note (Signed)
Patient is aware he has ultrasound 04/20/11.

## 2011-03-27 NOTE — Telephone Encounter (Signed)
Left a message for patient to call me. 

## 2011-03-27 NOTE — Assessment & Plan Note (Signed)
Patient complains of intermittent hematochezia. Patient is due for screening colonoscopy due to age so we can request this after he has evaluation for pancreatic stricture.

## 2011-03-27 NOTE — Telephone Encounter (Signed)
I have reviewed the endo results with the patient and see path results 03/22/11 for further details

## 2011-03-28 NOTE — Telephone Encounter (Signed)
Spoke with patient and gave him Dr. Christella Hartigan recommendations.

## 2011-03-28 NOTE — Telephone Encounter (Signed)
Left a message for patient to call me. 

## 2011-03-28 NOTE — Telephone Encounter (Signed)
EGD by Dr. Leone Payor last month, biopsies showed H. Pylori and he was put on antibiotics.  He is scheduled for EUS with me later this month to evaluated previous noted abnormal pancreatic duct.  I can repeat mucosa biopsies of stomach if continued gastritis at the time of EUS.

## 2011-04-11 ENCOUNTER — Encounter: Payer: Self-pay | Admitting: Internal Medicine

## 2011-04-13 ENCOUNTER — Encounter: Payer: Self-pay | Admitting: *Deleted

## 2011-04-13 NOTE — Progress Notes (Unsigned)
Pt presents wanting to talk about his diag and procedures for H.Pylori, printed some information on H.Pylori for pt, made him an appt for after his next procedure, reassured him and he left

## 2011-04-20 ENCOUNTER — Ambulatory Visit (HOSPITAL_COMMUNITY): Payer: Self-pay | Admitting: Anesthesiology

## 2011-04-20 ENCOUNTER — Telehealth: Payer: Self-pay | Admitting: *Deleted

## 2011-04-20 ENCOUNTER — Ambulatory Visit (HOSPITAL_COMMUNITY)
Admission: RE | Admit: 2011-04-20 | Discharge: 2011-04-20 | Disposition: A | Discharge status: Home / Self Care | Payer: Self-pay | Source: Ambulatory Visit | Attending: Gastroenterology | Admitting: Gastroenterology

## 2011-04-20 ENCOUNTER — Encounter (HOSPITAL_COMMUNITY): Payer: Self-pay | Admitting: *Deleted

## 2011-04-20 ENCOUNTER — Encounter (HOSPITAL_COMMUNITY): Payer: Self-pay | Admitting: Anesthesiology

## 2011-04-20 ENCOUNTER — Encounter: Payer: Self-pay | Admitting: Internal Medicine

## 2011-04-20 ENCOUNTER — Ambulatory Visit (INDEPENDENT_AMBULATORY_CARE_PROVIDER_SITE_OTHER): Payer: Self-pay | Admitting: Internal Medicine

## 2011-04-20 ENCOUNTER — Encounter (HOSPITAL_COMMUNITY)
Admission: RE | Disposition: A | Discharge status: Home / Self Care | Payer: Self-pay | Source: Ambulatory Visit | Attending: Gastroenterology

## 2011-04-20 VITALS — BP 129/89 | HR 102 | Temp 97.8°F | Ht 63.0 in | Wt 197.2 lb

## 2011-04-20 DIAGNOSIS — Z01812 Encounter for preprocedural laboratory examination: Secondary | ICD-10-CM | POA: Insufficient documentation

## 2011-04-20 DIAGNOSIS — I10 Essential (primary) hypertension: Secondary | ICD-10-CM | POA: Insufficient documentation

## 2011-04-20 DIAGNOSIS — E1165 Type 2 diabetes mellitus with hyperglycemia: Secondary | ICD-10-CM

## 2011-04-20 DIAGNOSIS — G4700 Insomnia, unspecified: Secondary | ICD-10-CM | POA: Insufficient documentation

## 2011-04-20 DIAGNOSIS — Z800 Family history of malignant neoplasm of digestive organs: Secondary | ICD-10-CM | POA: Insufficient documentation

## 2011-04-20 DIAGNOSIS — IMO0002 Reserved for concepts with insufficient information to code with codable children: Secondary | ICD-10-CM

## 2011-04-20 DIAGNOSIS — F329 Major depressive disorder, single episode, unspecified: Secondary | ICD-10-CM | POA: Insufficient documentation

## 2011-04-20 DIAGNOSIS — E119 Type 2 diabetes mellitus without complications: Secondary | ICD-10-CM | POA: Insufficient documentation

## 2011-04-20 DIAGNOSIS — K2960 Other gastritis without bleeding: Secondary | ICD-10-CM | POA: Insufficient documentation

## 2011-04-20 DIAGNOSIS — G43909 Migraine, unspecified, not intractable, without status migrainosus: Secondary | ICD-10-CM | POA: Insufficient documentation

## 2011-04-20 DIAGNOSIS — A048 Other specified bacterial intestinal infections: Secondary | ICD-10-CM | POA: Insufficient documentation

## 2011-04-20 DIAGNOSIS — F1020 Alcohol dependence, uncomplicated: Secondary | ICD-10-CM

## 2011-04-20 DIAGNOSIS — F1210 Cannabis abuse, uncomplicated: Secondary | ICD-10-CM | POA: Insufficient documentation

## 2011-04-20 DIAGNOSIS — IMO0001 Reserved for inherently not codable concepts without codable children: Secondary | ICD-10-CM

## 2011-04-20 DIAGNOSIS — F17200 Nicotine dependence, unspecified, uncomplicated: Secondary | ICD-10-CM | POA: Insufficient documentation

## 2011-04-20 DIAGNOSIS — K861 Other chronic pancreatitis: Secondary | ICD-10-CM | POA: Insufficient documentation

## 2011-04-20 DIAGNOSIS — F3289 Other specified depressive episodes: Secondary | ICD-10-CM | POA: Insufficient documentation

## 2011-04-20 DIAGNOSIS — K860 Alcohol-induced chronic pancreatitis: Secondary | ICD-10-CM

## 2011-04-20 HISTORY — PX: EUS: SHX5427

## 2011-04-20 LAB — GLUCOSE, CAPILLARY: Glucose-Capillary: 288 mg/dL — ABNORMAL HIGH (ref 70–99)

## 2011-04-20 SURGERY — UPPER ENDOSCOPIC ULTRASOUND (EUS) LINEAR
Anesthesia: Monitor Anesthesia Care

## 2011-04-20 MED ORDER — PROPOFOL 10 MG/ML IV EMUL
INTRAVENOUS | Status: DC | PRN
  Administered 2011-04-20: 50 ug/kg/min via INTRAVENOUS

## 2011-04-20 MED ORDER — METFORMIN HCL 500 MG PO TABS: 500.0000 mg | ORAL_TABLET | Freq: Every day | ORAL | Status: DC

## 2011-04-20 MED ORDER — DIPHENHYDRAMINE HCL 50 MG/ML IJ SOLN
INTRAMUSCULAR | Status: AC
  Filled 2011-04-20: qty 1

## 2011-04-20 MED ORDER — HYDROCHLOROTHIAZIDE 25 MG PO TABS: 25.0000 mg | ORAL_TABLET | Freq: Every day | ORAL | Status: DC

## 2011-04-20 MED ORDER — KETAMINE HCL 10 MG/ML IJ SOLN
INTRAMUSCULAR | Status: DC | PRN
  Administered 2011-04-20 (×2): 20 mg via INTRAVENOUS

## 2011-04-20 MED ORDER — LACTATED RINGERS IV SOLN
INTRAVENOUS | Status: DC
  Administered 2011-04-20: 09:00:00 via INTRAVENOUS

## 2011-04-20 MED ORDER — BUTAMBEN-TETRACAINE-BENZOCAINE 2-2-14 % EX AERO
INHALATION_SPRAY | CUTANEOUS | Status: DC | PRN
  Administered 2011-04-20: 2 via TOPICAL

## 2011-04-20 MED ORDER — MIDAZOLAM HCL 5 MG/5ML IJ SOLN
INTRAMUSCULAR | Status: DC | PRN
  Administered 2011-04-20 (×2): 2 mg via INTRAVENOUS

## 2011-04-20 MED ORDER — FENTANYL CITRATE 0.05 MG/ML IJ SOLN
INTRAMUSCULAR | Status: DC | PRN
  Administered 2011-04-20: 100 ug via INTRAVENOUS

## 2011-04-20 MED ORDER — DIPHENHYDRAMINE HCL 50 MG/ML IJ SOLN
25.0000 mg | Freq: Once | INTRAMUSCULAR | Status: AC
  Administered 2011-04-20: 25 mg via INTRAVENOUS

## 2011-04-20 NOTE — Progress Notes (Signed)
Patient ID: Michael [name], male   DOB: 1955/11/04, 56 y.o.   MRN: [id number]  HPI:  Patient is a 56 year old male with a past medical history listed below he presents to the outpatient clinic for urgent followup after he underwent EGD today for pancreatic biopsy to evaluate a pancreatic mass, the patient's sugar was noted to be in the 300s, in the past patient was noted to be borderline diabetic with an A1c of 6.6 in January 2013 at that time no medications were given. Patient in is asymptomatic, his CBGs are above 200, does not check his sugars at home. No other complaints.    Review of Systems: Negative except per history of present illness  Physical Exam:  Nursing notes and vitals reviewed General:  alert, well-developed, and cooperative to examination.   Lungs:  normal respiratory effort, no accessory muscle use, normal breath sounds, no crackles, and no wheezes. Heart:  normal rate, regular rhythm, no murmurs, no gallop, and no rub.   Abdomen:  soft, non-tender, normal bowel sounds, no distention, no guarding, no rebound tenderness, no hepatomegaly, and no splenomegaly.   Extremities:  No cyanosis, clubbing, edema Neurologic:  alert & oriented X3, nonfocal exam  Meds: Medications Prior to Admission  Medication Sig Dispense Refill  . Amoxicill-Clarithro-Omeprazole (OMECLAMOX-PAK) 500-500-20 MG MISC Take 1 Package by mouth daily.  10 each  0  . FLUoxetine (PROZAC) 20 MG capsule Take 20 mg by mouth daily.      . hydrochlorothiazide (HYDRODIURIL) 25 MG tablet Take 1 tablet (25 mg total) by mouth daily.  31 tablet  3  . omeprazole (PRILOSEC) 20 MG capsule Take 1 capsule (20 mg total) by mouth daily.  30 capsule  11   Medications Prior to Admission  Medication Dose Route Frequency Provider Last Rate Last Dose  . diphenhydrAMINE (BENADRYL) injection 25 mg  25 mg Intravenous Once Rachael Fee, MD   25 mg at 04/20/11 1106  . DISCONTD: butamben-tetracaine-benzocaine (CETACAINE) spray    PRN  Rachael Fee, MD   2 spray at 04/20/11 0950  . DISCONTD: fentaNYL (SUBLIMAZE) injection    PRN Doran Clay, CRNA   100 mcg at 04/20/11 4098  . DISCONTD: ketamine (KETALAR) injection    PRN Doran Clay, CRNA   20 mg at 04/20/11 0945  . DISCONTD: lactated ringers infusion   Intravenous Continuous Rachael Fee, MD      . DISCONTD: midazolam (VERSED) 5 MG/5ML injection    PRN Doran Clay, CRNA   2 mg at 04/20/11 0945  . DISCONTD: propofol (DIPRIVAN) 10 MG/ML infusion    Continuous PRN Doran Clay, CRNA   50 mcg/kg/min at 04/20/11 1191    Allergies: Ace inhibitors and Losartan Past Medical History  Diagnosis Date  . Pancreatitis   . Smoker   . Diabetes mellitus     diet controlled  . Gastritis due to alcohol without hemorrhage   . HTN (hypertension)   . Pancreatic lesion   . Heavy alcohol use     hx of  . Marijuana use     hx of  . Migraine   . Depression   . Helicobacter pylori gastritis 03/27/2011    on endoscopy  . Insomnia    Past Surgical History  Procedure Date  . Esophagogastroduodenoscopy 03/22/2011    Procedure: ESOPHAGOGASTRODUODENOSCOPY (EGD);  Surgeon: [name], MD;  Location: [facility];  Service: Endoscopy;  Laterality: N/A;  egd with balloon   . Balloon dilation  03/22/2011    Procedure: BALLOON DILATION;  Surgeon: [name], MD;  Location: [facility];  Service: Endoscopy;  Laterality: N/A;   Family History  Problem Relation Age of Onset  . Colon cancer Neg Hx   . Stomach cancer Neg Hx   . Anesthesia problems Neg Hx   . Hypotension Neg Hx   . Pseudochol deficiency Neg Hx   . Malignant hyperthermia Neg Hx   . Hypertension Sister   . Diabetes Sister   . Heart disease Sister    History   Social History  . Marital Status: Single    Spouse Name: N/A    Number of Children: 0  . Years of Education: N/A   Occupational History  . 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 Smoking status: Current Everyday Smoker -- 0.4 packs/day    Types: Cigarettes  . Smokeless tobacco: Never Used   Comment: info given 03/14/2011  . Alcohol Use: 0.0 oz/week    7-14 Cans of beer per week     Drinks 1-2 40 oz beers daily - says not everyday  . Drug Use: Yes    Special: Marijuana  . Sexually Active: Not on file   Other Topics Concern  . Not on file   Social History Narrative   Has been living at 3 different friend's homes since discharge 01/2011. Was living in the shelter prior to admission. Had previously lived with his niece and other family members but he has gotten kicked out each time.

## 2011-04-20 NOTE — Assessment & Plan Note (Addendum)
Patient's last A1c was 6.6 in Jan 2013, however his CBGs always appear elevated in 200's, therefore making his A1c discordant from his CBGs rendering the A1c result as questionable. Note that A1c may be falsely lowered in patients with hemolysis or anemia and those treated for iron, vitamin B12, or folate deficiency, and patients treated with erythropoietin. However patient has no sign of hemolysis, anemia, or history of folate, iron, or B12 deficiency.  Today his A1c is 7.3,  will give the patient a prescription for a glucometer to check his sugars 3 times a day before meals, start the patient on metformin low-dose, and reevaluate in 1 week to see his CBG patterns and make further adjustments to his medical therapy.

## 2011-04-20 NOTE — Transfer of Care (Signed)
Immediate Anesthesia Transfer of Care Note  Patient: Michael [name]  Procedure(s) Performed: Procedure(s) (LRB): UPPER ENDOSCOPIC ULTRASOUND (EUS) LINEAR (N/A)  Patient Location: PACU  Anesthesia Type: MAC  Level of Consciousness: awake and alert   Airway & Oxygen Therapy: Patient Spontanous Breathing and Patient connected to nasal cannula oxygen  Post-op Assessment: Report given to PACU RN and Post -op Vital signs reviewed and stable  Post vital signs: Reviewed and stable  Complications: No apparent anesthesia complications

## 2011-04-20 NOTE — Telephone Encounter (Signed)
Agree with plan 

## 2011-04-20 NOTE — H&P (Signed)
  HPI: This is a man with abdnormal pancreatic duct on 10/2010 imaging    Past Medical History  Diagnosis Date  . Pancreatitis   . Smoker   . Diabetes mellitus     diet controlled  . Gastritis due to alcohol without hemorrhage   . HTN (hypertension)   . Pancreatic lesion   . Heavy alcohol use     hx of  . Marijuana use     hx of  . Migraine   . Depression   . Helicobacter pylori gastritis 03/27/2011    on endoscopy  . Insomnia     Past Surgical History  Procedure Date  . Esophagogastroduodenoscopy 03/22/2011    Procedure: ESOPHAGOGASTRODUODENOSCOPY (EGD);  Surgeon: [name], MD;  Location: [facility];  Service: Endoscopy;  Laterality: N/A;  egd with balloon   . Balloon dilation 03/22/2011    Procedure: BALLOON DILATION;  Surgeon: [name], MD;  Location: [facility];  Service: Endoscopy;  Laterality: N/A;    Current Facility-Administered Medications  Medication Dose Route Frequency Provider Last Rate Last Dose  . lactated ringers infusion   Intravenous Continuous Rachael Fee, MD        Allergies as of 03/14/2011 - Review Complete 03/14/2011  Allergen Reaction Noted  . Ace inhibitors Swelling 02/02/2011  . Losartan Swelling 02/03/2011    Family History  Problem Relation Age of Onset  . Colon cancer Neg Hx   . Stomach cancer Neg Hx   . Anesthesia problems Neg Hx   . Hypotension Neg Hx   . Pseudochol deficiency Neg Hx   . Malignant hyperthermia Neg Hx   . Hypertension Sister   . Diabetes Sister   . Heart disease Sister     History   Social History  . Marital Status: Single    Spouse Name: N/A    Number of Children: 0  . Years of Education: N/A   Occupational History  . 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 Smoking status: Current Everyday Smoker -- 0.5 packs/day    Types: Cigarettes  . Smokeless tobacco: Never Used   Comment: info given 03/14/2011  . Alcohol Use: 0.0 oz/week    7-14 Cans of beer per week     Drinks 1-2 40 oz beers daily - says not everyday  . Drug Use: Yes    Special: Marijuana  . Sexually Active: Not on file   Other Topics Concern  . Not on file   Social History Narrative   Has been living at 3 different friend's homes since discharge 01/2011. Was living in the shelter prior to admission. Had previously lived with his niece and other family members but he has gotten kicked out each time.       Physical Exam: BP 146/96  Temp(Src) 98.2 F (36.8 C) (Oral)  Resp 13  Ht 5\' 5"  (1.651 m)  Wt 194 lb (87.998 kg)  BMI 32.28 kg/m2  SpO2 98% Constitutional: generally well-appearing Psychiatric: alert and oriented x3 Abdomen: soft, nontender, nondistended, no obvious ascites, no peritoneal signs, normal bowel sounds     Assessment and plan: 56 y.o. male with abnormal pancreatic duct  For EUS today with possible FNA

## 2011-04-20 NOTE — Progress Notes (Signed)
Pt glucose 325 in recovery.  States he did not know he was diabetic.  Called and made pt an appt with primary care physician, Dr. Kelli Churn, for today at 1:45 pm.  They are aware of blood sugar.

## 2011-04-20 NOTE — Telephone Encounter (Addendum)
[facility] calls, Michael [name], pt had endo procedure today and has now been diag as a new diabetic, blood sugars this am were 280's, per dr Christella Hartigan pt needs to be seen today in clinic, appt dr Gilford Rile [phone]

## 2011-04-20 NOTE — Discharge Instructions (Signed)

## 2011-04-20 NOTE — Op Note (Signed)
[facility] [address] [place], [place]  [address]  ENDOSCOPIC ULTRASOUND PROCEDURE REPORT  PATIENT:  Michael [name], Michael [name]  MR#:  [id number] BIRTHDATE:  [birth year]  GENDER:  male ENDOSCOPIST:  Rachael Fee, MD REFERRED BY:  [name], M.D., [facility] PROCEDURE DATE:  04/20/2011 PROCEDURE:  Upper EUS ASA CLASS:  Class III INDICATIONS:  abnormal pancreatic duct on imaging 10/12 MEDICATIONS:   MAC sedation, administered by CRNA  DESCRIPTION OF PROCEDURE:   After the risks, benefits, and alternatives of the procedure were thoroughly explained, informed consent was obtained.  The Pentax EUS Radial [id number] endoscope was introduced through the mouth and advanced to the second portion of the duodenum. <<PROCEDUREIMAGES>>  Endoscopic findings (limited views with radial and linear echoendoscopes): 1. Normal UGI tract  EUS findings: 1. Pancreatic parenchyma was diffusely abnormal. There was extensive honeycombing and lobularity to pancreas in uncinate, head, neck, body and visualized tail.  Main pancreatic duct was not clearly visible.  I did not see most proximal aspect of pancreas (tail), I suspect that part of pancreas is too far from gastric wall to visualize.  No discrete pancreatic masses. 2. No peripancreatic adenopathy 3. CBD was normal, non-dilated 4. Gallbladder was normal 5. Limited views of liver, spleen were normal.  Impression: Very clear changes of chronic pancreatitis throughout visualized pancreas. There were no discrete masses however the changes of chronic pancreatitis signficantly reduce the ability to detect pancreatic tumors.  The tail of pancreas was not seen, I suspect it is far from the gastric wall.  I could therefore not appreciate the dilated pancreatic duct with dinstinct transition in tail/body.  Fibrosis, scar tissue from chronic pancreatitis can cause pancreatic duct strictures.  I think repeating MRI (pancreatic protocol) with MRCP  images as well in 4 weeks (would be 6 months from last MRI) is reasonable. I explained to him that he should completely stop drinking alcohol to decrease chances of further damage to his pancreas.  ______________________________ Rachael Fee, MD  n. eSIGNED:   Rachael Fee at 04/20/2011 10:36 AM  Jarome Matin, [phone]

## 2011-04-20 NOTE — Anesthesia Preprocedure Evaluation (Addendum)
Anesthesia Evaluation  Patient identified by MRN, date of birth, ID band Patient awake    Reviewed: Allergy & Precautions, H&P , NPO status , Patient's Chart, lab work & pertinent test results  Airway Mallampati: II TM Distance: >3 FB Neck ROM: full    Dental No notable dental hx. (+) Missing,    Pulmonary neg pulmonary ROS,  breath sounds clear to auscultation  Pulmonary exam normal       Cardiovascular Exercise Tolerance: Good hypertension, Pt. on medications negative cardio ROS  Rhythm:regular Rate:Normal     Neuro/Psych negative neurological ROS  negative psych ROS   GI/Hepatic negative GI ROS, Neg liver ROS, (+)     substance abuse  alcohol use, Pancreatic mass.  pancreatitis   Endo/Other  negative endocrine ROSDiabetes mellitus-, Poorly Controlled, Type 2Diet controlled but comes in with fasting bs of 288 today.  Renal/GU negative Renal ROS  negative genitourinary   Musculoskeletal   Abdominal   Peds  Hematology negative hematology ROS (+)   Anesthesia Other Findings   Reproductive/Obstetrics negative OB ROS                          Anesthesia Physical Anesthesia Plan  ASA: III  Anesthesia Plan: MAC   Post-op Pain Management:    Induction:   Airway Management Planned: Simple Face Mask  Additional Equipment:   Intra-op Plan:   Post-operative Plan:   Informed Consent: I have reviewed the patients History and Physical, chart, labs and discussed the procedure including the risks, benefits and alternatives for the proposed anesthesia with the patient or authorized representative who has indicated his/her understanding and acceptance.   Dental Advisory Given  Plan Discussed with: CRNA and Surgeon  Anesthesia Plan Comments:         Anesthesia Quick Evaluation

## 2011-04-20 NOTE — Assessment & Plan Note (Signed)
Well controlled on current treatment, No new changes made today, Will continue to monitor.   

## 2011-04-20 NOTE — Anesthesia Postprocedure Evaluation (Signed)
  Anesthesia Post-op Note  Patient: Michael [name]  Procedure(s) Performed: Procedure(s) (LRB): UPPER ENDOSCOPIC ULTRASOUND (EUS) LINEAR (N/A)  Patient Location: PACU  Anesthesia Type: MAC  Level of Consciousness: awake and alert   Airway and Oxygen Therapy: Patient Spontanous Breathing  Post-op Pain: mild  Post-op Assessment: Post-op Vital signs reviewed, Patient's Cardiovascular Status Stable, Respiratory Function Stable, Patent Airway and No signs of Nausea or vomiting  Post-op Vital Signs: stable  Complications: No apparent anesthesia complications

## 2011-04-20 NOTE — Patient Instructions (Signed)
Start taking metformin 500 mg once daily, and checked her sugars 3 times a day

## 2011-04-24 ENCOUNTER — Encounter (HOSPITAL_COMMUNITY): Payer: Self-pay | Admitting: Gastroenterology

## 2011-04-25 ENCOUNTER — Encounter: Payer: Self-pay | Admitting: Internal Medicine

## 2011-04-25 ENCOUNTER — Ambulatory Visit (INDEPENDENT_AMBULATORY_CARE_PROVIDER_SITE_OTHER): Payer: Self-pay | Admitting: Internal Medicine

## 2011-04-25 VITALS — BP 141/90 | HR 93 | Temp 97.6°F | Ht 65.0 in | Wt 199.3 lb

## 2011-04-25 DIAGNOSIS — IMO0002 Reserved for concepts with insufficient information to code with codable children: Secondary | ICD-10-CM

## 2011-04-25 DIAGNOSIS — E1165 Type 2 diabetes mellitus with hyperglycemia: Secondary | ICD-10-CM

## 2011-04-25 DIAGNOSIS — R220 Localized swelling, mass and lump, head: Secondary | ICD-10-CM

## 2011-04-25 DIAGNOSIS — IMO0001 Reserved for inherently not codable concepts without codable children: Secondary | ICD-10-CM

## 2011-04-25 LAB — GLUCOSE, CAPILLARY: Glucose-Capillary: 195 mg/dL — ABNORMAL HIGH (ref 70–99)

## 2011-04-25 MED ORDER — METFORMIN HCL 500 MG PO TABS: 500.0000 mg | ORAL_TABLET | Freq: Two times a day (BID) | ORAL | Status: DC

## 2011-04-25 NOTE — Progress Notes (Signed)
Patient ID: Michael [name], male   DOB: Oct 23, 1955, 56 y.o.   MRN: [id number]  A 56 year old man with past medical history listed below comes for follow up Was seen 2 weeks ago at which time his A1c was 7.3 and his CBGs running 300s so he was started on metformin 500 mg once in the morning. He was asked to come back to see [place] that his blood glucose meter in 2 weeks He has had no complaints since the onset of metformin He is asymptomatic and compliant with his medications  Physical exam   General Appearance:     Filed Vitals:   04/25/11 1026  BP: 141/90  Pulse: 93  Temp: 97.6 F (36.4 C)  TempSrc: Oral  Height: 5\' 5"  (1.651 m)  Weight: 199 lb 4.8 oz (90.402 kg)  SpO2: 100%     Alert, cooperative, no distress, appears stated age  Head:    Normocephalic, without obvious abnormality, atraumatic  Eyes:    PERRL, conjunctiva/corneas clear, EOM's intact, fundi    benign, both eyes       Neck:   Supple, symmetrical, trachea midline, no adenopathy;       thyroid:  No enlargement/tenderness/nodules; no carotid   bruit or JVD  Lungs:     Clear to auscultation bilaterally, respirations unlabored  Chest wall:    No tenderness or deformity  Heart:    Regular rate and rhythm, S1 and S2 normal, no murmur, rub   or gallop  Abdomen:     Soft, non-tender, bowel sounds active all four quadrants,    no masses, no organomegaly  Extremities:   Extremities normal, atraumatic, no cyanosis or edema  Pulses:   2+ and symmetric all extremities  Skin:   Skin color, texture, turgor normal, no rashes or lesions  Neurologic:  nonfocal grossly   Review of system  Constitutional: Denies fever, chills, diaphoresis, appetite change and fatigue.  Respiratory: Denies SOB, DOE, cough, chest tightness,  and wheezing.   Cardiovascular: Denies chest pain, palpitations and leg swelling.  Gastrointestinal: Denies nausea, vomiting, abdominal pain, diarrhea, constipation, blood in stool and abdominal distention.    Skin: Denies pallor, rash and wound.  Neurological: Denies dizziness, light-headedness, numbness and headaches.

## 2011-04-25 NOTE — Assessment & Plan Note (Signed)
CBGs in 198 to 360 on metformin 500 mg once a day No side effects from metformin No  Hypoglycemia  Plan  Increase metformin to 500 mg twice a day  Follow up with PCP in 1-2 months  Lifestyle modification advised  Followup with Tobey Bride. at next visit

## 2011-04-26 ENCOUNTER — Telehealth: Payer: Self-pay

## 2011-04-27 ENCOUNTER — Telehealth: Payer: Self-pay | Admitting: Gastroenterology

## 2011-04-27 DIAGNOSIS — K8689 Other specified diseases of pancreas: Secondary | ICD-10-CM

## 2011-04-27 NOTE — Telephone Encounter (Signed)
Dr Leone Payor per EUS report from 04/20/11, he needs follow up MRCP in 4 weeks.  Will you be ordering this or Dr Christella Hartigan?  Please advise

## 2011-04-28 NOTE — Telephone Encounter (Signed)
I will order  He needs MR/MRCP with and without contrast to follow-up possible pancreatic mass/stricture as seen 10/12 MRCP

## 2011-05-01 ENCOUNTER — Other Ambulatory Visit: Payer: Self-pay

## 2011-05-01 DIAGNOSIS — K8689 Other specified diseases of pancreas: Secondary | ICD-10-CM

## 2011-05-01 NOTE — Telephone Encounter (Signed)
Addended by: Annett Fabian on: 05/01/2011 02:12 PM   Modules accepted: Orders

## 2011-05-01 NOTE — Telephone Encounter (Signed)
I have scheduled the MRCP for 05/23/11 he needs to arrive at 9:00 @ [facility] to have stat BUN and creatinine and then MRI at 10:00.  I have left a message for the patient to call me back to discuss

## 2011-05-02 NOTE — Telephone Encounter (Signed)
I am unable to reach the patient again.  His phone message says this phone does not accept incoming calls.  I will mail the information and the instructions to the patient at the listed address

## 2011-05-10 ENCOUNTER — Encounter: Payer: Self-pay | Admitting: *Deleted

## 2011-05-10 ENCOUNTER — Ambulatory Visit (INDEPENDENT_AMBULATORY_CARE_PROVIDER_SITE_OTHER): Payer: Self-pay | Admitting: Internal Medicine

## 2011-05-10 VITALS — BP 129/89 | HR 87 | Temp 97.5°F | Wt 195.9 lb

## 2011-05-10 DIAGNOSIS — R1013 Epigastric pain: Secondary | ICD-10-CM

## 2011-05-10 NOTE — Progress Notes (Signed)
Patient ID: Michael [name], male   DOB: 1955/12/15, 56 y.o.   MRN: [id number]  56 Y/o m with pmh listed below comes for abd discomfort On going for past 1 week. Feels like food getting stuck in chest and also has some bubbling feeling in epigastrium, LLQ, throat He had an EGD/biopsy in 2/13 which was positive for h-pylori treated with triple therapy and EUS/biopsy in end of March for pancreatic mass  HE is on ppi for GERD now.   Physical exam  General Appearance:     Filed Vitals:   05/10/11 1018  BP: 129/89  Pulse: 87  Temp: 97.5 F (36.4 C)  TempSrc: Oral  Weight: 195 lb 14.4 oz (88.86 kg)     Alert, cooperative, no distress, appears stated age  Head:    Normocephalic, without obvious abnormality, atraumatic  Eyes:    PERRL, conjunctiva/corneas clear, EOM's intact, fundi    benign, both eyes       Neck:   Supple, symmetrical, trachea midline, no adenopathy;       thyroid:  No enlargement/tenderness/nodules; no carotid   bruit or JVD  Lungs:     Clear to auscultation bilaterally, respirations unlabored  Chest wall:    No tenderness or deformity  Heart:    Regular rate and rhythm, S1 and S2 normal, no murmur, rub   or gallop  Abdomen:     Soft, non-tender, bowel sounds active all four quadrants,    no masses, no organomegaly  Extremities:   Extremities normal, atraumatic, no cyanosis or edema  Pulses:   2+ and symmetric all extremities  Skin:   Skin color, texture, turgor normal, no rashes or lesions  Neurologic:  nonfocal grossly    ROS  Constitutional: Denies fever, chills, diaphoresis, appetite change and fatigue.  Respiratory: Denies SOB, DOE, cough, chest tightness,  and wheezing.   Cardiovascular: Denies chest pain, palpitations and leg swelling.  Gastrointestinal: Denies nausea, vomiting,  diarrhea, constipation, blood in stool and abdominal distention.  Skin: Denies pallor, rash and wound.  Neurological: Denies dizziness, light-headedness, numbness and headaches.

## 2011-05-10 NOTE — Patient Instructions (Signed)
Stopping taking metformin while your stomach is hurting Call your stomach doctor if you are still having pain after stopping the metformin

## 2011-05-10 NOTE — Progress Notes (Signed)
Pt walked into clinic with c/o epigastric pain each time he eats. Also has a hard time swallowing. Onset on and off march. He sees Dr Christella Hartigan for GI problems.  Has had esophagus stretched in past.  Today he is asking for relief of "bubbling up of food, after eating".  Will see today.

## 2011-05-10 NOTE — Assessment & Plan Note (Signed)
Gastritis vs metformin vs esophageal stricture  Plan - stop metformin as time course consistent with onset of symptoms after increasing the dose - continue ppi Rx - if no improvement, consider confirming h-pylori eradication - asked him to consult his GI doctor for any other evaluation.  - able to tolerate po intake and clinically stable for outpatient Mx

## 2011-05-10 NOTE — Progress Notes (Signed)
Patient ID: Michael [name], male   DOB: January 13, 1956, 56 y.o.   MRN: [id number]  56 Y/o with pmh listed below comes for epigastric/abd discomfort.  No complaints today Complaint with medications No medications side effect Uptodate on refills See individual a/p for further details.   Physical exam  General Appearance:     Filed Vitals:   05/10/11 1018  BP: 129/89  Pulse: 87  Temp: 97.5 F (36.4 C)  TempSrc: Oral  Weight: 195 lb 14.4 oz (88.86 kg)     Alert, cooperative, no distress, appears stated age  Head:    Normocephalic, without obvious abnormality, atraumatic  Eyes:    PERRL, conjunctiva/corneas clear, EOM's intact, fundi    benign, both eyes       Neck:   Supple, symmetrical, trachea midline, no adenopathy;       thyroid:  No enlargement/tenderness/nodules; no carotid   bruit or JVD  Lungs:     Clear to auscultation bilaterally, respirations unlabored  Chest wall:    No tenderness or deformity  Heart:    Regular rate and rhythm, S1 and S2 normal, no murmur, rub   or gallop  Abdomen:     Soft, non-tender, bowel sounds active all four quadrants,    no masses, no organomegaly  Extremities:   Extremities normal, atraumatic, no cyanosis or edema  Pulses:   2+ and symmetric all extremities  Skin:   Skin color, texture, turgor normal, no rashes or lesions  Neurologic:  nonfocal grossly    ROS  Constitutional: Denies fever, chills, diaphoresis, appetite change and fatigue.  Respiratory: Denies SOB, DOE, cough, chest tightness,  and wheezing.   Cardiovascular: Denies chest pain, palpitations and leg swelling.  Gastrointestinal: Denies nausea, vomiting, abdominal pain, diarrhea, constipation, blood in stool and abdominal distention.  Skin: Denies pallor, rash and wound.  Neurological: Denies dizziness, light-headedness, numbness and headaches.

## 2011-05-18 ENCOUNTER — Telehealth: Payer: Self-pay | Admitting: Licensed Clinical Social Worker

## 2011-05-18 NOTE — Telephone Encounter (Signed)
[name] is well known to Inpatient Social Worker, which provided [name] with this CSW phone number.  Pt called to schedule time to meet with this CSW.  Pt states he will be in to see PCP on 5/23.  CSW informed pt CSW will be available after PCP and RD appt.  Pt was able to provide CSW with [employer] case worker, [name] [phone].  CSW will follow up with [name] as [name] states he placed a Housing application two years ago.  Pt is aware CSW is available to assist as needed.

## 2011-05-19 ENCOUNTER — Encounter: Payer: Self-pay | Admitting: Licensed Clinical Social Worker

## 2011-05-19 NOTE — Telephone Encounter (Signed)
CSW called pt's case worker to determine status of housing application.  Pt's application was submitted Aug. 2012.  Application remains on wait list due to limited number of one bedroom availability.

## 2011-05-19 NOTE — Telephone Encounter (Signed)
A user error has taken place: encounter opened in error, closed for administrative reasons.

## 2011-05-22 ENCOUNTER — Telehealth: Payer: Self-pay | Admitting: Licensed Clinical Social Worker

## 2011-05-22 NOTE — Telephone Encounter (Signed)
Michael [name] called CSW to inquire if their are any other housing options. Pt states any [place] locations are not an option. [employer] is full.  CSW placed call to [facility] [phone], left message for case manager Hale Bogus regarding availability in their transitional housing program.  CSW also called [employer] [phone] regarding eligibility, left message.  CSW called pt back and notified Michael [name] of current options explored.  Awaiting return call back from local agencies.

## 2011-05-22 NOTE — Telephone Encounter (Signed)
Mr. Fandino called in to CSW office.  Pt inquiring if CSW was able to speak with [employer].  CSW informed pt he remains on wait list due to limited number of one bedroom's available.  CSW informed pt, doubtful that he would qualify for a 2 bedroom.  Pt states his bus pass is good until May 17th.  CSW informed pt unable to obtain a 30 day pass, but would be able to get several one way fare tickets.  Pt aware CSW has one way fare bus passes available.  Pt's next appt 5/23 in [facility] and aware CSW will meet with him at that appt.  CSW placed 3 one way fare tickets in the mail to c/o pt's to niece's address: [address] [place], [address]

## 2011-05-23 ENCOUNTER — Other Ambulatory Visit: Payer: Self-pay | Admitting: Internal Medicine

## 2011-05-23 ENCOUNTER — Ambulatory Visit (HOSPITAL_COMMUNITY)
Admission: RE | Admit: 2011-05-23 | Discharge: 2011-05-23 | Disposition: A | Discharge status: Home / Self Care | Payer: Self-pay | Source: Ambulatory Visit | Attending: Internal Medicine | Admitting: Internal Medicine

## 2011-05-23 DIAGNOSIS — K869 Disease of pancreas, unspecified: Secondary | ICD-10-CM | POA: Insufficient documentation

## 2011-05-23 DIAGNOSIS — K8689 Other specified diseases of pancreas: Secondary | ICD-10-CM

## 2011-05-23 LAB — CREATININE, SERUM: GFR calc Af Amer: >90 mL/min (ref >90)

## 2011-05-23 MED ORDER — GADOBENATE DIMEGLUMINE 529 MG/ML IV SOLN
18.0000 mL | Freq: Once | INTRAVENOUS | Status: AC | PRN
  Administered 2011-05-23: 18 mL via INTRAVENOUS

## 2011-05-23 NOTE — Progress Notes (Signed)
Quick Note:  Michael [name], Let him know the lesion in tail of pancreas is stable - good news and goes against cancer It could still be a problem - possibly pre-cancerous so need to watch it  1) recall for MR/MRCP 10/2011 2) REV me next available (not urgent - by June) to review overall situation  Have cced Dr. Christella Hartigan and his pcp   ______

## 2011-05-26 NOTE — Telephone Encounter (Signed)
CSW called pt earlier this morning to discuss housing.  CSW placed f/u call to [facility]  [facility] has bed available in their transitional housing program.  Tessa Lerner. faxed referral form.  CSW spoke with pt to obtain information to complete initial referral form.  Informed pt he will need to sign ROI for [facility] and [facility], pt in agreement.  CSW will leave ROI's with inpt CSW as pt is planning to see next week.

## 2011-05-29 ENCOUNTER — Telehealth: Payer: Self-pay | Admitting: Licensed Clinical Social Worker

## 2011-05-29 NOTE — Telephone Encounter (Signed)
[name] telephoned CSW to inquire about receiving bus passes for upcoming medical appointments.  Pt has appt's on 20th and 23rd of this month.  Pt states he has used previous passes for medication and is now at the social security office.  CSW discussed the [facility] program to pt and inquired as to when his phone would have additional minutes added.  Pt states minutes will be added on the 28th of this month and he will try to see if someone will add some earlier.  Pt provided his sister as a contact number for him [phone].  Pt in agreement with [facility] referral.  Pt aware of need to sign ROI to move forward with [facility] [employer] housing application.  CSW will leave ROI's to be signed and 5 bus passes at fron desk for pt.

## 2011-05-30 ENCOUNTER — Telehealth: Payer: Self-pay | Admitting: Licensed Clinical Social Worker

## 2011-05-30 NOTE — Telephone Encounter (Signed)
CSW received call from uninsured case worker, L. Broadnax to discuss pt current needs and services.  When pt calls CSW, as pt's minutes have currently expired, CSW will have pt contact Mr. Seward Meth to link with a substance abuse program.  Pt is not eligible for [employer] as he does not have a permanent address.  CSW to continue to provide encouragement to link with [facility], as they may be able to assist with transportation/bus passes for medical appt and assist with disability.  CSW will also work with pt on vocational rehab services.

## 2011-05-31 ENCOUNTER — Encounter: Payer: Self-pay | Admitting: Ophthalmology

## 2011-06-02 ENCOUNTER — Telehealth: Payer: Self-pay | Admitting: Licensed Clinical Social Worker

## 2011-06-02 NOTE — Telephone Encounter (Signed)
CSW received call from Michael [name] today requesting info on status of ROI.  CSW informed Michael [name] ROI were needed to complete application for Transitional Housing at [facility].  CSW informed pt of benefit of utilizing [facility] care manager, L. [name].  CSW encouraged Michael [name] to call Michael [name] as an added community support.  Pt states he has not had great success at the [facility], "the guy there had an attitude" and "they make you sit there all day and don't do anything for you".  CSW discussed possibility of [employer] helping with bus passes to medical appt's  and vocational rehab.  Pt seemed hesitant to return to [facility] to utilize any services.  Pt states he will call Michael [name] and "listen to what he has to offer".  Pt aware CSW is available to assist as needed.

## 2011-06-05 NOTE — Progress Notes (Signed)
Addended by: Darnelle Maffucci on: 06/05/2011 04:45 PM   Modules accepted: Orders

## 2011-06-12 ENCOUNTER — Encounter: Payer: Self-pay | Admitting: Internal Medicine

## 2011-06-12 ENCOUNTER — Ambulatory Visit (INDEPENDENT_AMBULATORY_CARE_PROVIDER_SITE_OTHER): Payer: Self-pay | Admitting: Internal Medicine

## 2011-06-12 VITALS — BP 134/84 | HR 92 | Ht 65.0 in | Wt 200.4 lb

## 2011-06-12 DIAGNOSIS — B9681 Helicobacter pylori [H. pylori] as the cause of diseases classified elsewhere: Secondary | ICD-10-CM

## 2011-06-12 DIAGNOSIS — K860 Alcohol-induced chronic pancreatitis: Secondary | ICD-10-CM

## 2011-06-12 DIAGNOSIS — F1020 Alcohol dependence, uncomplicated: Secondary | ICD-10-CM

## 2011-06-12 DIAGNOSIS — A048 Other specified bacterial intestinal infections: Secondary | ICD-10-CM

## 2011-06-12 DIAGNOSIS — IMO0002 Reserved for concepts with insufficient information to code with codable children: Secondary | ICD-10-CM

## 2011-06-12 DIAGNOSIS — IMO0001 Reserved for inherently not codable concepts without codable children: Secondary | ICD-10-CM

## 2011-06-12 DIAGNOSIS — E1165 Type 2 diabetes mellitus with hyperglycemia: Secondary | ICD-10-CM

## 2011-06-12 DIAGNOSIS — K861 Other chronic pancreatitis: Secondary | ICD-10-CM

## 2011-06-12 DIAGNOSIS — K869 Disease of pancreas, unspecified: Secondary | ICD-10-CM

## 2011-06-12 DIAGNOSIS — R1319 Other dysphagia: Secondary | ICD-10-CM

## 2011-06-12 NOTE — Assessment & Plan Note (Signed)
Small volume - colonoscopy pending MR results

## 2011-06-12 NOTE — Progress Notes (Signed)
  Subjective:    Patient ID: Michael [name], male    DOB: 1955-09-01, 56 y.o.   MRN: [id number]  HPI This very pleasant man returns for followup of pancreatic tail lesion. He had an endoscopic ultrasound which showed chronic pancreatitis but no discrete lesion in the tail was not seen although well. We have decided to followup and MRI. He reports less diarrhea. He had a tortuous esophagus with some stenosis at the GE junction that I dilated. His dysphagia is improved but still occasionally has some, he does not have dentures and he is totally edentulous. He has not lost weight. Still has occasional epigastric pain. He'll occasionally see small amounts of bright red blood per rectum. He does drink alcohol on occasion but knows he is not supposed to.   Review of Systems As above- he also reports blood sugars over 500 at times and in the 3 and 400 range on home checks    Objective:   Physical Exam Somewhat chronically ill, overweight no acute distress       Assessment & Plan:

## 2011-06-12 NOTE — Assessment & Plan Note (Signed)
He tells me blood sugars have been over 500 at least once, they seem high. Given his chronic pancreatitis he may have disruption of his islet cells and require insulin. He is seeing primary care and a dietitian in 2 days.

## 2011-06-12 NOTE — Assessment & Plan Note (Signed)
Improved, he is edentulous and this is part of the problem. He was dilated in March but no clear stricture there was dysmotility type pattern and stenotic gastroesophageal junction.

## 2011-06-12 NOTE — Assessment & Plan Note (Signed)
EUS negative except chronic pancreatitis Repeat MRCP

## 2011-06-12 NOTE — Assessment & Plan Note (Signed)
This was treated with omeprazole and Biaxin and amoxicillin.

## 2011-06-12 NOTE — Assessment & Plan Note (Signed)
I don't think he has the area or much abdominal pain from this though some of his intermittent epigastric pain could be from this problem. Await MRI MRCP repeat. He has some diarrhea problems that are not typical for steatorrhea.

## 2011-06-12 NOTE — Assessment & Plan Note (Signed)
Not totally compliant with abstinence. I reminded him of the need to be completely abstinent from alcohol today.

## 2011-06-12 NOTE — Patient Instructions (Addendum)
You have been given a separate informational sheet regarding your tobacco use, the importance of quitting and local resources to help you quit. You should avoid alcohol completely, it will cause more damage to your pancreas.  You have been scheduled for a MRI/MRCP  at [facility] (1st floor of hospital) on 06/16/11 at 9:00am. Please arrive 15 minutes prior to your appointment for registration. Make certain not to have anything to eat or drink 4 hours prior to your appointment. Should you need to reschedule your appointment, please contact radiology at [phone].  Dr. Leone Payor will call you with results and plans.

## 2011-06-12 NOTE — Assessment & Plan Note (Signed)
Seems improved ?

## 2011-06-15 ENCOUNTER — Encounter: Payer: Self-pay | Admitting: Ophthalmology

## 2011-06-15 ENCOUNTER — Ambulatory Visit (INDEPENDENT_AMBULATORY_CARE_PROVIDER_SITE_OTHER): Payer: Self-pay | Admitting: Ophthalmology

## 2011-06-15 ENCOUNTER — Ambulatory Visit (INDEPENDENT_AMBULATORY_CARE_PROVIDER_SITE_OTHER): Payer: Self-pay | Admitting: Dietician

## 2011-06-15 VITALS — BP 137/85 | HR 91 | Temp 97.6°F | Ht 65.0 in | Wt 199.2 lb

## 2011-06-15 DIAGNOSIS — R51 Headache: Secondary | ICD-10-CM

## 2011-06-15 DIAGNOSIS — IMO0002 Reserved for concepts with insufficient information to code with codable children: Secondary | ICD-10-CM

## 2011-06-15 DIAGNOSIS — Z111 Encounter for screening for respiratory tuberculosis: Secondary | ICD-10-CM

## 2011-06-15 DIAGNOSIS — IMO0001 Reserved for inherently not codable concepts without codable children: Secondary | ICD-10-CM

## 2011-06-15 DIAGNOSIS — E1165 Type 2 diabetes mellitus with hyperglycemia: Secondary | ICD-10-CM

## 2011-06-15 DIAGNOSIS — Z79899 Other long term (current) drug therapy: Secondary | ICD-10-CM

## 2011-06-15 DIAGNOSIS — R519 Headache, unspecified: Secondary | ICD-10-CM

## 2011-06-15 DIAGNOSIS — E089 Diabetes mellitus due to underlying condition without complications: Secondary | ICD-10-CM

## 2011-06-15 LAB — GLUCOSE, CAPILLARY: Glucose-Capillary: 374 mg/dL — ABNORMAL HIGH (ref 70–99)

## 2011-06-15 MED ORDER — INSULIN GLARGINE 100 UNIT/ML ~~LOC~~ SOLN: 18.0000 [IU] | Freq: Every day | SUBCUTANEOUS | Status: DC

## 2011-06-15 MED ORDER — INSULIN PEN NEEDLE 31G X 5 MM MISC: 1.0000 | Freq: Every day | Status: DC

## 2011-06-15 NOTE — Assessment & Plan Note (Addendum)
Patient complains of headache today and say he has a history of migraine many years ago. Will get his blood sugar under control and see if he continues to have symptoms. Has only had a few episodes.

## 2011-06-15 NOTE — Patient Instructions (Signed)
Please make a follow-up with Michael [name] in 2 weeks so we can see how the insulin if affecting your blood sugars.  Please bring meter and sheet with blood sugars in it with you.

## 2011-06-15 NOTE — Assessment & Plan Note (Signed)
Started patient on lantus 18 units daily since patient has likely burned out his pancreas from multiple episodes of pancreatitis. Sugar has become increasingly uncontrolled and he has symptoms of poluria, polydipsia now. A1c is 12.1 today, which corresponds with very high reading he has on his meter of 300-400.  Was previously 7.3 in late march and 6.6 in January. He also went to see Norm Parcel for teaching on how to give injections. Gave him sample of lantus pen to get him started.

## 2011-06-15 NOTE — Patient Instructions (Addendum)
-  Start taking lantus insulin, take 18 units every morning -try your blood sugar 30 minutes before & 30 minutes after meals- 1st week- before & after breakfast/ next week- before & after lunch / 3rd week- before & after dinner

## 2011-06-15 NOTE — Progress Notes (Signed)
Subjective:   Patient ID: Michael [name] male   DOB: [birth year] 56 y.o.   MRN: [id number]  HPI: Michael [name] is a 56 y.o. man who presents for follow up for hyperglycemia.  He complains of very high sugars, polyuria, polydipsia, and headaches. Is falling asleep very easily. Is taking metformin only. Blood sugars on monitor are in 300-400s. Gets meds for free at [facility].  Headaches happen about midday. Mostly in unilateral temple. Has had 4-5 total. Lasts about 1 hour. Light bothers him. Lays down in dark room.   Feels like right leg is giving way suddenly, about 7-8 months, happens about once a week.  Eyes- red, has goop in eye every morning, denies itchy eyes, eyelids itch.  Past Medical History  Diagnosis Date  . Pancreatitis   . Smoker   . Diabetes mellitus     diet controlled  . Gastritis due to alcohol without hemorrhage   . HTN (hypertension)   . Pancreatic lesion   . Heavy alcohol use     hx of  . Marijuana use     hx of  . Migraine   . Depression   . Helicobacter pylori gastritis 03/27/2011    on endoscopy  . Insomnia   . Pancreatic mass     patient will need f/u MRI in Oct 2013 to assess growth   Current Outpatient Prescriptions  Medication Sig Dispense Refill  . FLUoxetine (PROZAC) 20 MG capsule Take 20 mg by mouth daily.      . hydrochlorothiazide (HYDRODIURIL) 25 MG tablet Take 1 tablet (25 mg total) by mouth daily.  31 tablet  3  . metFORMIN (GLUCOPHAGE) 500 MG tablet Take 500 mg by mouth 2 (two) times daily with a meal.      . omeprazole (PRILOSEC) 20 MG capsule Take 1 capsule (20 mg total) by mouth daily.  30 capsule  11   Family History  Problem Relation Age of Onset  . Colon cancer Neg Hx   . Stomach cancer Neg Hx   . Anesthesia problems Neg Hx   . Hypotension Neg Hx   . Pseudochol deficiency Neg Hx   . Malignant hyperthermia Neg Hx   . Hypertension Sister   . Diabetes Sister   . Heart disease Sister    History   Social History  .  Marital Status: Single    Spouse Name: N/A    Number of Children: 0  . Years of Education: N/A   Occupational History  . 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 Smoking status: Current Everyday Smoker -- 0.4 packs/day    Types: Cigarettes  . Smokeless tobacco: Never Used   Comment: info given 03/14/2011  . Alcohol Use: 0.0 oz/week    7-14 Cans of beer per week     Drinks 1-2 40 oz beers daily - says not everyday  . Drug Use: Yes    Special: Marijuana  . Sexually Active: None   Other Topics Concern  . None   Social History Narrative   Has been living at 3 different friend's homes since discharge 01/2011. Was living in the shelter prior to admission. Had previously lived with his niece and other family members but he has gotten kicked out each time.     Objective:  Physical Exam: Filed Vitals:  06/15/11 0919  BP: 137/85  Pulse: 91  Temp: 97.6 F (36.4 C)  TempSrc: Oral  Height: 5\' 5"  (1.651 m)  Weight: 199 lb 3.2 oz (90.357 kg)  SpO2: 99%   General: sitting in chair HEENT: PERRL, EOMI, no scleral icterus Cardiac: RRR, no rubs, murmurs or gallops Pulm: clear to auscultation bilaterally, moving normal volumes of air Abd: soft, nontender, nondistended, BS present Ext: warm and well perfused, trace pedal edema on left side Neuro: alert and oriented X3, cranial nerves II-XII grossly intact, gait WNL, balance good  Assessment & Plan:

## 2011-06-15 NOTE — Progress Notes (Signed)
Diabetes Self-Management Training (DSMT)  Initial Visit  06/15/2011 [name], identified by name and date of birth, is a 56 y.o. male with Type 2 Diabetes. Year of diabetes diagnosis: 2013 Other persons present: no  ASSESSMENT Patient concerns are Medication, Monitoring and Glycemic control.  There were no vitals taken for this visit.  Labs reviewed. Family history of diabetes: Yes Support systems: few right now Special needs: Simplified materials Prior DM Education: Yes Patients belief/attitude about diabetes: Diabetes can be controlled. Self foot exams daily: Yes Diabetes Complications: None   Medications See Medications list.  Is interested in learning more and Needs skills/knowledge review   Exercise Plan Doing walking for 45 minutesa day.   Self-Monitoring Medication Nutrition Monitor: wave sense Presto from health bdepartment Frequency of testing: 1-2 times/day Breakfast:300-400s most of day recently  Hyperglycemia: Yes Daily Hypoglycemia: No   Meal Planning Limited knowledge   Assessment comments: had a few readings in 100s- patient denies drinking any alcohol,. Then later said he may have 1-2 beers. Educated about hypoglycemia risk with diabetes and alcohol and reinforced previous suggestions for patient to abstain from drinking alcoholic beverages.        INDIVIDUAL DIABETES EDUCATION PLAN:  Medication Monitoring Acute complication: _______________________________________________________________________  Intervention TOPICS COVERED TODAY:  Medication  Taught/evaluated insulin injection, site rotation, insulin storage and needle disposal. Monitoring  Taught/discussed recording of test results and interpretation of SMBG. Acute complication  Taught treatment of hypoglycemia - the 15 rule. and prevention of hypoglycemia  PATIENTS GOALS/PLAN (copy and paste in patient instructions so patient receives a copy): 1.  Learning Objective:       Know  how to use insulin pen 2.  Behavioral Objective:         Monitoring: To identify blood glucose trends, I will test my blood glucose 2x/day  day and bring log book with you to visits Most of the time 75%  Personalized Follow-Up Plan for Ongoing Self Management Support:  Doctor's Office and CDE visits ______________________________________________________________________   Outcomes Expected outcomes: Demonstrated interest in learning.Expect positive changes in lifestyle. Self-care Barriers: Lack of transportation, Lack of material resources Education material provided: yes- booklets on using insulin pen and sheet to help him record blood sugar Patient to contact team via Phone if problems or questions. Time in: 1030     Time out: 1130 Future DSMT - 2 wks   Miroslav Gin, Lupita Leash

## 2011-06-16 ENCOUNTER — Other Ambulatory Visit: Payer: Self-pay | Admitting: Internal Medicine

## 2011-06-16 ENCOUNTER — Encounter: Payer: Self-pay | Admitting: Ophthalmology

## 2011-06-16 ENCOUNTER — Telehealth: Payer: Self-pay | Admitting: Internal Medicine

## 2011-06-16 ENCOUNTER — Ambulatory Visit (HOSPITAL_COMMUNITY)
Admission: RE | Admit: 2011-06-16 | Discharge: 2011-06-16 | Disposition: A | Discharge status: Home / Self Care | Payer: Self-pay | Source: Ambulatory Visit | Attending: Internal Medicine | Admitting: Internal Medicine

## 2011-06-16 DIAGNOSIS — K869 Disease of pancreas, unspecified: Secondary | ICD-10-CM | POA: Insufficient documentation

## 2011-06-16 DIAGNOSIS — D3500 Benign neoplasm of unspecified adrenal gland: Secondary | ICD-10-CM | POA: Insufficient documentation

## 2011-06-16 MED ORDER — GADOBENATE DIMEGLUMINE 529 MG/ML IV SOLN
18.0000 mL | Freq: Once | INTRAVENOUS | Status: AC | PRN
  Administered 2011-06-16: 18 mL via INTRAVENOUS

## 2011-06-16 NOTE — Telephone Encounter (Signed)
Left a message for patient to call me. 

## 2011-06-16 NOTE — Telephone Encounter (Signed)
CSW met with pt after his medical appt.  Pt completed and signed verification of homelessness.  Pt to receive his PPD on 5/28 and return on 5/30 to have result read. Once this is completed, referral for Transitional Housing will be completed.  CSW will send in what is available at this time.  Pt states he is meeting with [facility] weekly and has been unable to reach his [facility] case worker for assessment, they have left several message with each other.  Transportation seems to be Mr. Blackwell primary obstacle for his continuity of medical care.  Pt states he has 4 days left on his 31 day pass provided by the inpt CSW.  CSW provided pt with 2 one fares to return on 5/28.  Pt also provided with 2 bags of grocery from [facility] pantry.

## 2011-06-20 ENCOUNTER — Other Ambulatory Visit: Payer: Self-pay | Admitting: *Deleted

## 2011-06-20 ENCOUNTER — Ambulatory Visit (INDEPENDENT_AMBULATORY_CARE_PROVIDER_SITE_OTHER): Payer: Self-pay | Admitting: *Deleted

## 2011-06-20 ENCOUNTER — Telehealth: Payer: Self-pay | Admitting: Internal Medicine

## 2011-06-20 DIAGNOSIS — Z111 Encounter for screening for respiratory tuberculosis: Secondary | ICD-10-CM

## 2011-06-20 NOTE — Telephone Encounter (Signed)
I have spoken to the patient about his results of the MRI all questions answered

## 2011-06-20 NOTE — Telephone Encounter (Signed)
Results given see results of MRI from today

## 2011-06-20 NOTE — Progress Notes (Signed)
Quick Note:  Let him know that the changes are stable and I do not think this is a tumor.  I would like him to schedule an appointment to see me in office for follow-up and to arrange colonoscopy. Prefer office visit for him and not direct. ______

## 2011-06-20 NOTE — Telephone Encounter (Signed)
The above listed phone number is incorrect.  The person that answered says it is a wrong number.  I did leave him a voicemail on his listed home phone number

## 2011-06-21 ENCOUNTER — Telehealth: Payer: Self-pay | Admitting: Licensed Clinical Social Worker

## 2011-06-21 NOTE — Telephone Encounter (Signed)
CSW faxed referral form for housing to [facility].  Will fax PPD results and Vesta Mixer notes when available.

## 2011-06-22 ENCOUNTER — Ambulatory Visit: Payer: Self-pay | Admitting: Internal Medicine

## 2011-06-22 ENCOUNTER — Telehealth: Payer: Self-pay | Admitting: *Deleted

## 2011-06-22 LAB — TB SKIN TEST: Induration: 0 mm

## 2011-06-22 NOTE — Telephone Encounter (Signed)
Note enter pt requested solostar - [facility] pharmacy aware. Dr Maisie Fus stated ok.

## 2011-06-22 NOTE — Telephone Encounter (Signed)
Pt is at [facility] - Rx from Dr Maisie Fus states Lantus vial - pt always uses Lantus Solostar - Dr Maisie Fus aware ok for solostar - same directions. Stanton Kidney Maleta Pacha RN 06/22/11 9:45AM

## 2011-06-22 NOTE — Telephone Encounter (Addendum)
CSW continues to have frequent contact with Michael [name].  He currently has an atty that is working with him on his disability denial.  Pt is aware CSW is available to assist as needed with this process.  Pt is scheduled to come in today to have his PPD test read.  CSW will fax completed referral to [facility] once this is read.  Pt states he is in need of a 31 day bus pass, Michael [name] aware this CSW does not have them available.  CSW forwarded to inpt CSW for request.  This CSW provided Michael [name] with 2 one way fare to leave this appt and return to [facility] appt in June.  Pt has explored vocational programs and completed an application.

## 2011-06-23 NOTE — Telephone Encounter (Signed)
CSW received several calls for [name] this morning.  Pt voiced frustration with his lack of transportation.  Pt inquiring about [employer], CSW informed an application could be started and there would be a fare. Pt upset that there is a cost that at this time he could not obtain.  CSW informed pt, CSW did not know of any free transportation in [place] that he would be eligible for at this time.  Pt frustrated and instructed this CSW to cancel his [facility] application and said goodbye disconnecting the phone call. Mr. Happ then called this CSW back, apologized and requested CSW to continue to housing application.  PPD results faxed.

## 2011-06-26 ENCOUNTER — Telehealth: Payer: Self-pay | Admitting: Licensed Clinical Social Worker

## 2011-06-26 NOTE — Telephone Encounter (Signed)
Mr. Gillian Shields left message stating he has received Mr. Goren application and will call Mr. Woodberry on 6/3 to schedule interview.

## 2011-06-30 ENCOUNTER — Encounter: Payer: Self-pay | Admitting: Ophthalmology

## 2011-06-30 ENCOUNTER — Ambulatory Visit (INDEPENDENT_AMBULATORY_CARE_PROVIDER_SITE_OTHER): Payer: Self-pay | Admitting: Ophthalmology

## 2011-06-30 VITALS — BP 136/91 | HR 94 | Temp 98.1°F | Ht 65.0 in | Wt 196.3 lb

## 2011-06-30 DIAGNOSIS — E1165 Type 2 diabetes mellitus with hyperglycemia: Secondary | ICD-10-CM

## 2011-06-30 DIAGNOSIS — Z5900 Homelessness unspecified: Secondary | ICD-10-CM

## 2011-06-30 DIAGNOSIS — IMO0001 Reserved for inherently not codable concepts without codable children: Secondary | ICD-10-CM

## 2011-06-30 DIAGNOSIS — IMO0002 Reserved for concepts with insufficient information to code with codable children: Secondary | ICD-10-CM

## 2011-06-30 LAB — GLUCOSE, CAPILLARY: Glucose-Capillary: 246 mg/dL — ABNORMAL HIGH (ref 70–99)

## 2011-06-30 MED ORDER — GLUCOSE BLOOD VI STRP: ORAL_STRIP | Status: DC

## 2011-06-30 NOTE — Patient Instructions (Addendum)
-  Change lantus dose to 25 units each night. -Take metformin 500mg  twice a day.

## 2011-06-30 NOTE — Progress Notes (Signed)
Subjective:   Patient ID: Michael [name] male   DOB: 1955/11/10 56 y.o.   MRN: [id number]  HPI: Michael [name] is a 56 y.o. man who present for follow up. Lowest blood sugar is 190s. His sugar log shows values from 291-568. Still is having polyuria and feels like vision is blurry. He said he feels dizzy in the morning sometimes. Is taking metformin 500 only once a day.  Michael [name] is helping him get housing [facility]. Got PPD read last week. Will see Michael [name] on Monday.  Alcohol- patient is trying to cut down on drinking, hasn't drank for over one week.  Mood- pretty much stay to myself. Staying at his niece's house at times.   Past Medical History  Diagnosis Date  . Pancreatitis   . Smoker   . Diabetes mellitus     diet controlled  . Gastritis due to alcohol without hemorrhage   . HTN (hypertension)   . Pancreatic lesion   . Heavy alcohol use     hx of  . Marijuana use     hx of  . Migraine   . Depression   . Helicobacter pylori gastritis 03/27/2011    on endoscopy  . Insomnia   . Pancreatic mass     patient will need f/u MRI in Oct 2013 to assess growth   Current Outpatient Prescriptions  Medication Sig Dispense Refill  . FLUoxetine (PROZAC) 20 MG capsule Take 20 mg by mouth daily.      . hydrochlorothiazide (HYDRODIURIL) 25 MG tablet Take 1 tablet (25 mg total) by mouth daily.  31 tablet  3  . insulin glargine (LANTUS) 100 UNIT/ML injection Inject 18 Units into the skin daily.  15 mL  12  . Insulin Pen Needle 31G X 5 MM MISC 1 each by Does not apply route daily.  100 each  11  . metFORMIN (GLUCOPHAGE) 500 MG tablet Take 500 mg by mouth 2 (two) times daily with a meal.      . omeprazole (PRILOSEC) 20 MG capsule Take 1 capsule (20 mg total) by mouth daily.  30 capsule  11   Family History  Problem Relation Age of Onset  . Colon cancer Neg Hx   . Stomach cancer Neg Hx   . Anesthesia problems Neg Hx   . Hypotension Neg Hx   . Pseudochol deficiency Neg Hx     . Malignant hyperthermia Neg Hx   . Hypertension Sister   . Diabetes Sister   . Heart disease Sister    History   Social History  . Marital Status: Single    Spouse Name: N/A    Number of Children: 0  . Years of Education: N/A   Occupational History  . 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 Smoking status: Current Everyday Smoker -- 0.1 packs/day    Types: Cigarettes  . Smokeless tobacco: Never Used   Comment: info given 03/14/2011  . Alcohol Use: 0.0 oz/week    7-14 Cans of beer per week     Drinks 1-2 40 oz beers daily - says not everyday  . Drug Use: Yes    Special: Marijuana  . Sexually Active: None   Other Topics Concern  . None   Social History Narrative   Has been living at 3 different  friend's homes since discharge 01/2011. Was living in the shelter prior to admission. Had previously lived with his niece and other family members but he has gotten kicked out each time.     Objective:  Physical Exam: Filed Vitals:   06/30/11 1340  BP: 136/91  Pulse: 94  Temp: 98.1 F (36.7 C)  TempSrc: Oral  Height: 5\' 5"  (1.651 m)  Weight: 196 lb 4.8 oz (89.041 kg)  SpO2: 100%   General: exotropic talkative middle aged male sitting in chair HEENT: PERRL, EOMI, no scleral icterus Cardiac: RRR, no rubs, murmurs or gallops Pulm: clear to auscultation bilaterally, moving normal volumes of air Abd: soft, nontender, nondistended, BS present Ext: warm and well perfused, no pedal edema Neuro: alert and oriented X3, cranial nerves II-XII grossly intact  Assessment & Plan:

## 2011-07-06 ENCOUNTER — Other Ambulatory Visit: Payer: Self-pay | Admitting: Ophthalmology

## 2011-07-06 MED ORDER — INSULIN GLARGINE 100 UNIT/ML ~~LOC~~ SOLN: 25.0000 [IU] | Freq: Every day | SUBCUTANEOUS | Status: DC

## 2011-07-07 NOTE — Assessment & Plan Note (Addendum)
Will increase lantus dose to 25 units. Instructed to take metformin to 500mg  BID, patient was taking only once a day.

## 2011-07-07 NOTE — Assessment & Plan Note (Signed)
Patient is working with [name] to establish housing.

## 2011-07-10 ENCOUNTER — Telehealth: Payer: Self-pay | Admitting: Licensed Clinical Social Worker

## 2011-07-10 NOTE — Telephone Encounter (Signed)
CSW received referral to assist pt with prescription through [facility].  Pt provided inpt CSW with new contact number.  CSW placed called to pt.  CSW left message requesting return call.

## 2011-07-12 ENCOUNTER — Encounter: Payer: Self-pay | Admitting: Licensed Clinical Social Worker

## 2011-07-12 NOTE — Telephone Encounter (Signed)
Mr. Marston returned call to CSW.  Pt has been kicked out of his niece's apartment and continues to need housing.  Pt has not received call from [facility] regarding scheduling an interview.  Pt states that since he was kicked out he no longer has his bus pass, stating it was with his items at his niece's apt.  Pt has an appt on the 27th of June.  Pt provided CSW with two add'l contact numbers as his phone no longer has minutes [phone], [phone]).  CSW placed call to Tessa Lerner at [facility] and left new contact numbers for Mr. Burkhead.  Prescription assistance is no longer an issue, Mr. Seidman states he has his new insulin.  CSW will leave 2 one day fare passes for Mr. Litsey in the Main Social Work office of [facility] per pt's request.

## 2011-07-12 NOTE — Progress Notes (Signed)
Mr. Wages was seen by CSW today as walk-in appt.  Mr. Harju requesting to see CSW as he was recently "kicked out" of his current living situation with his niece.  Mr. Brine states niece locked him out without him being able to get his belongings, ie: social security card, atty info, and medical paperwork. Mr. Schaner requesting CSW to call atty regarding letter needed for disability appeal.  Mr. Koenigs provided CSW will atty name and address. Pt's main concern is transportation to/from doctor appt's as his last 31 day pass was with his belongings at his niece's home.  CSW provided pt with two one -way fare cards.  Pt utilized CSW phone to call sister to attempt to get his belongings from niece.  CSW placed call to [facility] to inquire about new sample for Mr. Sweda lantus.  [facility] states pt needs to come down and sign necessary paperwork prior to any more samples being dispensed.  Pt aware and will go down today.  Pt aware CSW is available to assist as needed.

## 2011-07-13 ENCOUNTER — Telehealth: Payer: Self-pay | Admitting: *Deleted

## 2011-07-13 NOTE — Telephone Encounter (Signed)
Pt stopped by office - c/o of discomfort left back area just above waist. Denies any problems with burning or  freq urination. Unable to sleep last PM. Appt made 07/14/11 3:15PM Dr Maisie Fus. Stanton Kidney Arretta Toenjes RN 07/13/11 10:30AM

## 2011-07-14 ENCOUNTER — Encounter: Payer: Self-pay | Admitting: Ophthalmology

## 2011-07-14 ENCOUNTER — Encounter: Payer: Self-pay | Admitting: Licensed Clinical Social Worker

## 2011-07-20 ENCOUNTER — Encounter: Payer: Self-pay | Admitting: Internal Medicine

## 2011-07-20 ENCOUNTER — Ambulatory Visit (INDEPENDENT_AMBULATORY_CARE_PROVIDER_SITE_OTHER): Payer: Self-pay | Admitting: Internal Medicine

## 2011-07-20 ENCOUNTER — Encounter: Payer: Self-pay | Admitting: Licensed Clinical Social Worker

## 2011-07-20 VITALS — BP 124/76 | HR 88 | Ht 65.0 in | Wt 202.0 lb

## 2011-07-20 DIAGNOSIS — K8689 Other specified diseases of pancreas: Secondary | ICD-10-CM

## 2011-07-20 DIAGNOSIS — R109 Unspecified abdominal pain: Secondary | ICD-10-CM

## 2011-07-20 DIAGNOSIS — K869 Disease of pancreas, unspecified: Secondary | ICD-10-CM

## 2011-07-20 DIAGNOSIS — K860 Alcohol-induced chronic pancreatitis: Secondary | ICD-10-CM

## 2011-07-20 DIAGNOSIS — F1020 Alcohol dependence, uncomplicated: Secondary | ICD-10-CM

## 2011-07-20 DIAGNOSIS — R197 Diarrhea, unspecified: Secondary | ICD-10-CM

## 2011-07-20 DIAGNOSIS — R1013 Epigastric pain: Secondary | ICD-10-CM

## 2011-07-20 DIAGNOSIS — Z1211 Encounter for screening for malignant neoplasm of colon: Secondary | ICD-10-CM

## 2011-07-20 DIAGNOSIS — R1010 Upper abdominal pain, unspecified: Secondary | ICD-10-CM

## 2011-07-20 DIAGNOSIS — K861 Other chronic pancreatitis: Secondary | ICD-10-CM

## 2011-07-20 MED ORDER — MOVIPREP 100 G PO SOLR: ORAL | Status: DC

## 2011-07-20 NOTE — Assessment & Plan Note (Signed)
Stable to improved. He does not seem to be having steatorrhea at this time. He has not and losing weight that is stable. He claims abstinence from alcohol. Observation at this point. Some of his abdominal discomfort could be from this.

## 2011-07-20 NOTE — Assessment & Plan Note (Signed)
He has not really had a mass, there is a focal dilation of his pancreatic tail probably related to some sort of stricturing and the chronic pancreatitis. Clinically he appears stable to improved. We'll consider a repeat MRN October though I'm not sure that's going to help [place], if anything I might wait longer and make a year from the last MRI but again not clear to really needed. We'll follow him clinically at this point.

## 2011-07-20 NOTE — Patient Instructions (Addendum)
You have been scheduled for a colonoscopy with propofol. Please follow written instructions given to you at your visit today.  You have been given a Moviprep Kit today to use.

## 2011-07-20 NOTE — Assessment & Plan Note (Signed)
This is improved, after H. pylori treatment. May be a component of chronic pancreatitis. Observe at this point it does not seem to be causing major quality of life issues and I think he's had adequate imaging.

## 2011-07-20 NOTE — Progress Notes (Signed)
  Subjective:    Patient ID: Michael [name], male    DOB: 1955-07-26, 56 y.o.   MRN: [id number]  HPI The patient returns for followup after he had endoscopic ultrasound and a subsequent MRI of the pancreas. He does have chronic pancreatitis. There was a questionable mass lesion in the distal pancreas and the tail area. It does not appear to be any type of discrete mass and is probably changes related to chronic pancreatitis, there is stability onto MRIs about 6 months apart. He does have occasional upper abdominal pain. He is occasionally constipated occasionally has diarrhea. His blood sugars are better but they still range above 200 at times and he has polyuria. He is living with his sister he says he is abstinent from alcohol at this point.  Medications, allergies, past medical history, past surgical history, family history and social history are reviewed and updated in the EMR.   Review of Systems As above per history of present illness    Objective:   Physical Exam General:  NAD Eyes:   Anicteric Mouth: Edentulous Lungs:  clear Heart:  S1S2 no rubs, murmurs or gallops Abdomen:  soft and nontender, BS+    Data Reviewed:  Endoscopic ultrasound, cytology MRI. I've explained this to the patient.     Assessment & Plan:   1. Upper abdominal pain   2. Chronic pancreatitis   3. Alcoholism   4. Special screening for malignant neoplasms, colon    Please read problem oriented charting also.The risks and benefits as well as alternatives of endoscopic procedure(s) have been discussed and reviewed. All questions answered. The patient agrees to proceed. Colonoscopy will be scheduled.

## 2011-07-20 NOTE — Assessment & Plan Note (Signed)
He does not seem to have chronic diarrhea anymore. I will place him in for a screening colonoscopy and will query him about this when he returns, to see if we can any random biopsies. Certainly pancreatic insufficiency could be causing some problems. But again, he is reporting only sporadic diarrhea there is no weight loss.

## 2011-08-01 ENCOUNTER — Telehealth: Payer: Self-pay | Admitting: *Deleted

## 2011-08-01 NOTE — Telephone Encounter (Signed)
Pt calls and states he has hives and itching x 2-3 weeks, this all over his body, nothing helps, nothing makes worse, denies changing soap or laundry detergent, no new lotions, powders, no new food. The hives last "for awhile" and then go away, itching all the time. appt given for 7/10 at 1415 per sharonb.

## 2011-08-02 ENCOUNTER — Ambulatory Visit (INDEPENDENT_AMBULATORY_CARE_PROVIDER_SITE_OTHER): Payer: Self-pay | Admitting: Internal Medicine

## 2011-08-02 VITALS — BP 128/87 | HR 71 | Temp 97.8°F | Ht 65.0 in | Wt 197.2 lb

## 2011-08-02 DIAGNOSIS — F29 Unspecified psychosis not due to a substance or known physiological condition: Secondary | ICD-10-CM

## 2011-08-02 DIAGNOSIS — IMO0002 Reserved for concepts with insufficient information to code with codable children: Secondary | ICD-10-CM

## 2011-08-02 DIAGNOSIS — K861 Other chronic pancreatitis: Secondary | ICD-10-CM

## 2011-08-02 DIAGNOSIS — E1165 Type 2 diabetes mellitus with hyperglycemia: Secondary | ICD-10-CM

## 2011-08-02 DIAGNOSIS — E119 Type 2 diabetes mellitus without complications: Secondary | ICD-10-CM

## 2011-08-02 DIAGNOSIS — K860 Alcohol-induced chronic pancreatitis: Secondary | ICD-10-CM

## 2011-08-02 DIAGNOSIS — L299 Pruritus, unspecified: Secondary | ICD-10-CM

## 2011-08-02 DIAGNOSIS — IMO0001 Reserved for inherently not codable concepts without codable children: Secondary | ICD-10-CM

## 2011-08-02 DIAGNOSIS — Z79899 Other long term (current) drug therapy: Secondary | ICD-10-CM

## 2011-08-02 LAB — CBC WITH DIFFERENTIAL/PLATELET
Lymphocytes Relative: 22 % (ref 12–46)
Lymphs Abs: 2 10*3/uL (ref 0.7–4.0)
Neutro Abs: 6 10*3/uL (ref 1.7–7.7)
Neutrophils Relative %: 66 % (ref 43–77)
Platelets: 239 10*3/uL (ref 150–400)
RBC: 4.91 MIL/uL (ref 4.22–5.81)
WBC: 9.1 10*3/uL (ref 4.0–10.5)

## 2011-08-02 LAB — GLUCOSE, CAPILLARY: Glucose-Capillary: 96 mg/dL (ref 70–99)

## 2011-08-02 MED ORDER — INSULIN GLARGINE 100 UNIT/ML ~~LOC~~ SOLN: 25.0000 [IU] | Freq: Two times a day (BID) | SUBCUTANEOUS | Status: DC

## 2011-08-02 MED ORDER — HYDROXYZINE HCL 10 MG PO TABS: 10.0000 mg | ORAL_TABLET | Freq: Three times a day (TID) | ORAL | Status: DC | PRN

## 2011-08-02 MED ORDER — METFORMIN HCL 500 MG PO TABS: 1000.0000 mg | ORAL_TABLET | Freq: Two times a day (BID) | ORAL | Status: DC

## 2011-08-02 NOTE — Progress Notes (Signed)
Patient ID: Michael [name], male   DOB: 1955/12/22, 56 y.o.   MRN: [id number]  Subjective:   Patient ID: Michael [name] male   DOB: Sep 15, 1955 56 y.o.   MRN: [id number]  HPI: [name] is a 56 y.o. with a past medical history as outlined below, who presents for acute visit because of body itching.   Patient has been having itchy all over his body in the past 4-5 months. He tried to change soaps and used different lotions without significant help. Because of severe itching, patient cannot sleep well. There is no any rashes. He has not changed his medications recently.  Regarding his diabetes, his currently using Lantus 25 units twice a day. He said he is measuring his blood sugar 3 times a day, but he did not bring in his meter with him today. Today his A1c is 8.6 which improved from his previous A1c of 12.1 on 06/15/11.  Regarding his chronic pancreatitis, he has been followed up with Dr. Baldo Ash. Currently he has a mild abdominal pain which has not changed in nature and is at his baseline. Patient said he quit drinking alcohol already. He has an appointment with Dr. Baldo Ash at 08/30/11.    Past Medical History  Diagnosis Date  . Chronic pancreatitis   . Smoker   . Diabetes mellitus     diet controlled  . Gastritis due to alcohol without hemorrhage   . HTN (hypertension)   . Pancreatic lesion     appears innocent and related to chronic pancreatitis  . Heavy alcohol use     hx of  . Marijuana use     hx of  . Migraine   . Depression   . Helicobacter pylori gastritis 03/27/2011    on endoscopy, treated with omeprazole, clarithromycin and amoxicillin  . Insomnia    Current Outpatient Prescriptions  Medication Sig Dispense Refill  . FLUoxetine (PROZAC) 20 MG capsule Take 40 mg by mouth daily.       Marland Kitchen glucose blood (AGAMATRIX PRESTO TEST) test strip Use as instructed  100 each  12  . hydrochlorothiazide (HYDRODIURIL) 25 MG tablet Take 1 tablet (25 mg total) by mouth daily.  31 tablet  3    . insulin glargine (LANTUS) 100 UNIT/ML injection Inject 25 Units into the skin 2 (two) times daily.  10 mL  0  . Insulin Pen Needle 31G X 5 MM MISC 1 each by Does not apply route daily.  100 each  11  . metFORMIN (GLUCOPHAGE) 500 MG tablet Take 2 tablets (1,000 mg total) by mouth 2 (two) times daily with a meal.  120 tablet  5  . omeprazole (PRILOSEC) 20 MG capsule Take 1 capsule (20 mg total) by mouth daily.  30 capsule  11  . DISCONTD: insulin glargine (LANTUS) 100 UNIT/ML injection Inject 50 Units into the skin daily.      . hydrOXYzine (ATARAX/VISTARIL) 10 MG tablet Take 1 tablet (10 mg total) by mouth every 8 (eight) hours as needed for itching.  60 tablet  3  . MOVIPREP 100 G SOLR Use per prep instructions  1 kit  0   Family History  Problem Relation Age of Onset  . Colon cancer Neg Hx   . Stomach cancer Neg Hx   . Anesthesia problems Neg Hx   . Hypotension Neg Hx   . Pseudochol deficiency Neg Hx   . Malignant hyperthermia Neg Hx   . Hypertension Sister   . Diabetes Sister   .  Heart disease Sister    History   Social History  . Marital Status: Single    Spouse Name: N/A    Number of Children: 0  . Years of Education: N/A   Occupational History  . 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 Smoking status: Current Everyday Smoker -- 0.1 packs/day    Types: Cigarettes  . Smokeless tobacco: Never Used   Comment: info given 03/14/2011  . Alcohol Use: 0.0 oz/week    7-14 Cans of beer per week     is cutting down  . Drug Use: Yes    Special: Marijuana  . Sexually Active: Not on file   Other Topics Concern  . Not on file   Social History Narrative   Has been living at 3 different friend's homes since discharge 01/2011. Was living in the shelter prior to admission. Had previously lived with his niece and other family members but he has gotten kicked out each time.     Review of Systems: ROS: General: no fevers, chills, no changes in body weight, no changes in appetite Skin: no rash HEENT: no blurry vision, hearing changes or sore throat Pulm: no dyspnea, coughing, wheezing CV: no chest pain, palpitations, shortness of breath Abd: no nausea/vomiting, has mild abdominal pain. GU: no dysuria, hematuria, polyuria Ext: no arthralgias, myalgias Neuro: no weakness, numbness, or tingling   Objective:  Physical Exam: Filed Vitals:   08/02/11 1434  BP: 128/87  Pulse: 71  Temp: 97.8 F (36.6 C)  TempSrc: Oral  Height: 5\' 5"  (1.651 m)  Weight: 197 lb 3.2 oz (89.449 kg)  SpO2: 99%   General: resting in bed, not in acute distress HEENT: PERRL, EOMI, no scleral icterus Cardiac: S1/S2, RRR, No murmurs, gallops or rubs Pulm: Good air movement bilaterally, Clear to auscultation bilaterally, No rales, wheezing, rhonchi or rubs. Abd: Soft,  nondistended, nontender, no rebound pain, no organomegaly, BS present Ext: No rashes or edema, 2+DP/PT pulse bilaterally Musculoskeletal: No joint deformities, erythema, or stiffness, ROM full and no nontender Skin: no rashes. No skin bruise. No signs of scabies Neuro: alert and oriented X3, cranial nerves II-XII grossly intact, muscle strength 5/5 in all extremeties,  sensation to light touch intact.  Psych.: patient is not psychotic, no suicidal or hemocidal ideation.   Assessment & Plan:

## 2011-08-02 NOTE — Assessment & Plan Note (Signed)
Etiology is not clear. There is no signs of scabies on physical examination. The differential diagnoses include elevation of bilirubin or abnormal liver function, thyroid dysfunction and blood disorders. Will treat patient symptomatically with hydroxyzine. We'll get CMP , CBC with differential and TSH.

## 2011-08-02 NOTE — Patient Instructions (Addendum)
1. The new medication for itchy, hydroxyzine may make you sleep. Please be careful and do not drive a car if you feel sleepy and dizzy. I increased your metformin dosage from 500 to 1000 mg twice a day. 2. Please take all medications as prescribed.  3. If you have worsening of your symptoms or new symptoms arise, please call the clinic ([phone]), or go to the ER immediately if symptoms are severe.

## 2011-08-02 NOTE — Assessment & Plan Note (Signed)
Patient already quit drinking alcohol. He has mild abdominal pain which is at his baseline. He has an appointment with gastroenterologist, Dr. Baldo Ash at 08/30/11.

## 2011-08-02 NOTE — Assessment & Plan Note (Signed)
A1c improved from 12.1 to 8.6 since 06/15/11. Patient is currently taking Lantus 25 units twice a day and metformin 500 mg twice a day. He did not have any symptoms for hypoglycemia. Will increase his metformin to 1000 mg twice a day and a followup in one month.

## 2011-08-03 ENCOUNTER — Telehealth: Payer: Self-pay | Admitting: *Deleted

## 2011-08-03 ENCOUNTER — Other Ambulatory Visit: Payer: Self-pay | Admitting: Internal Medicine

## 2011-08-03 DIAGNOSIS — L299 Pruritus, unspecified: Secondary | ICD-10-CM

## 2011-08-03 LAB — COMPLETE METABOLIC PANEL WITH GFR
ALT: 22 U/L (ref 0–53)
CO2: 31 mEq/L (ref 19–32)
Calcium: 9.4 mg/dL (ref 8.4–10.5)
Chloride: 100 mEq/L (ref 96–112)
GFR, Est African American: >89 mL/min
Potassium: 3.8 mEq/L (ref 3.5–5.3)
Sodium: 140 mEq/L (ref 135–145)
Total Bilirubin: 0.7 mg/dL (ref 0.3–1.2)
Total Protein: 6.7 g/dL (ref 6.0–8.3)

## 2011-08-03 MED ORDER — HYDROXYZINE PAMOATE 25 MG PO CAPS: 25.0000 mg | ORAL_CAPSULE | Freq: Three times a day (TID) | ORAL | Status: AC | PRN

## 2011-08-03 NOTE — Telephone Encounter (Signed)
[facility] 25mg  capsules of vistaril which they substitute for hydroxyzine, could they change, if so please change med list and i will call in.

## 2011-08-07 ENCOUNTER — Encounter: Payer: Self-pay | Admitting: Licensed Clinical Social Worker

## 2011-08-07 NOTE — Progress Notes (Signed)
Michael [name] is a walk in to the clinic today.  Pt state he is waiting for his food stamps and would like to make a healthy life style change.  Pt continues to meet with our RD.  Discussed [facility] referral, pt has not made contact with Mr. Seward Meth. Pt states he is unsure about housing with [facility] as the room are double rooms, pt uncomfortable sharing room with another male.  CSW faxed referral to [facility] RD for pantry referral.

## 2011-08-09 ENCOUNTER — Encounter: Payer: Self-pay | Admitting: *Deleted

## 2011-08-09 NOTE — Progress Notes (Signed)
Dr. Evorn Gong note says no rash.  If there is a rash, we need to see him about it.  If no rash, would double dose of hydroxyzine and wait.

## 2011-08-09 NOTE — Progress Notes (Signed)
Pt walked into clinic stating the medication her received at last visit is not helping.  He is taking hydroxyzine three times a day without any relief. He goes to [facility] pharmacy.  Itching is not new, onset 4 - 5 months ago. Rash remains on neck and upper chest. Can you try another medication.  Pt has appointment on 8/2 in clinic.

## 2011-08-10 ENCOUNTER — Telehealth: Payer: Self-pay | Admitting: Dietician

## 2011-08-10 NOTE — Telephone Encounter (Signed)
Was told by [facility] they cannot get Lantus with orange card. He is out of insulin.     Called [facility] who said patient should be able to  get lantus this afternoon when they have a pharmacist in to give it to him. Patient is also asking about hydroxazine did not work- note Dr. Aundria Rud suggestion to double the dose of hydroxazine if no rash- [facility] needs an order to do that- will discuss with TRiage nurse.

## 2011-08-10 NOTE — Telephone Encounter (Signed)
I called [facility] and told them pt will need to be seen to evaluate the rash he is talking about.  Dr Clyde Lundborg states pt did not have a rash on last visit. At that visit, after evaluation,  we can increase hydroxyzine as recommended by Dr Aundria Rud. [facility] will inform pt when he calls in today.  We are not able to reach pt by phone.

## 2011-08-11 ENCOUNTER — Encounter: Payer: Self-pay | Admitting: Dietician

## 2011-08-11 ENCOUNTER — Ambulatory Visit (INDEPENDENT_AMBULATORY_CARE_PROVIDER_SITE_OTHER): Payer: Self-pay | Admitting: Dietician

## 2011-08-11 VITALS — Ht 65.0 in | Wt 201.8 lb

## 2011-08-11 DIAGNOSIS — E1165 Type 2 diabetes mellitus with hyperglycemia: Secondary | ICD-10-CM

## 2011-08-11 DIAGNOSIS — IMO0002 Reserved for concepts with insufficient information to code with codable children: Secondary | ICD-10-CM

## 2011-08-11 DIAGNOSIS — IMO0001 Reserved for inherently not codable concepts without codable children: Secondary | ICD-10-CM

## 2011-08-11 NOTE — Progress Notes (Signed)
Diabetes Self-Management Training (DSMT)  Initial Visit  08/11/2011 [name], identified by name and date of birth, is a 56 y.o. male with Type 2 Diabetes. Year of diabetes diagnosis: 2005  ASSESSMENT Patient concerns are Glycemic control.  Height 5\' 5"  (1.651 m), weight 201 lb 12.8 oz (91.536 kg). Body mass index is 33.58 kg/(m^2). Lab Results  Component Value Date   LDLCALC 61 02/03/2011   Lab Results  Component Value Date   HGBA1C 8.6 08/02/2011   Labs, medications, allergies and health maintenance reviewed.   Family history of diabetes: Yes Patients belief/attitude about diabetes: Diabetes can be controlled. Self foot exams daily: Yes Diabetes Complications: None Support systems: family Special needs: Unable to determine Prior DM Education: Yes- limited to while in hospital and doctor visits- not formal per patient   Medications See Medications list.  Needs skills/knowledge review   Exercise Plan Doing ADLs and walking for 60 minutesa day.   Self-Monitoring Meter: Michael [name]- did not bring with him today or check blood sugar this am Frequency of testing: 2-3 times/day- reports mostly high 100s to 300s when he checks at home Hyperglycemia: Yes Daily Hypoglycemia: Yes Rarely   Meal Planning Interested in improving Patient reports Not eating fried foods, drinking mostly Cold water, sweet tea or regular soda maybe 1x per month. 24 hr recall: 6 am: Organic blueberry juice 8 oz 1-2 pm Turkey/bolgna/tomato sandwich- white wheat or wheat or flat bread  5-7 pm boiled potato, baked chicken, bread 2-3 slices, green beans and corn.  Sweets- cookie, cake, water  200$food stamps/month,shops 2x/month- [place] meat, bread, sausage, cheeze its. Cheese, milk- whole  Cabbage, macaroni, saltines, rice, snacks  Assessment comments: patient concerned with other health problems today. Suspect he may have undetected hypoglycemia form his report and high dose of basal  insulin. Encouraged him to carry meter with him, check when symptomatic and bring meter to all visits here at Internal Medicine center.   INDIVIDUAL DIABETES EDUCATION PLAN:  Nutrition management Monitoring Acute complication: _______________________________________________________________________  Intervention TOPICS COVERED TODAY:  Nutrition management  Role of diet in the treatment of diabetes and the relationship between the three main macronutritents and blood glucose control. Reviewed blood glucose goals for pre and post meals and how to evaluate the patients' food intake on their blood glucose level. Meal options for control of blood glucose level and chronic complications. Monitoring  Purpose and frequency of SMBG. Identified appropriate SMBG and A1C goals. Acute complication  Taught treatment of hypoglycemia - the 15 rule. Discussed and identified patients' treatment of hyperglycemia.  PATIENTS GOALS/PLAN (copy and paste in patient instructions so patient receives a copy): 1.  Learning Objective:       Know symptoms of hypoglyemia and what to do to treat it 2.  Behavioral Objective:         Nutrition: To improve blood glucose control I will follow meal plan of low fat and more fruits and vegetables Never 0% Monitoring: To identify blood glucose trends, I will carry my meter to office visits  Sometimes 25%  Personalized Follow-Up Plan for Ongoing Self Management Support:  Doctor's Office, family and CDE visits ______________________________________________________________________   Outcomes Expected outcomes: Demonstrated interest in learning.Expect positive changes in lifestyle. Self-care Barriers: Lack of transportation, Lack of material resources, Coping skills Education material provided: yes- living with diabetes, plate method handout Patient to contact team via Phone if problems or questions. Time in: 0930     Time out: 1030 Future DSMT - 4-6 wks  Michael [name]

## 2011-08-11 NOTE — Patient Instructions (Addendum)
Your blood sugar today was 163 mg/dl about 4 hours after drinking your juice.   Please pick up your insulin at [facility] today.  Eat vegetables and fruit twice every day- eat lunch and dinner using the plate method- half a plate of vegetable  shopping list for shopping 2x a month:  1 gallon 1% milk 2  loaves whole grain bread- put one in freezer 5 bags frozen vegetables such as-      greens beans      Okra      Broccoli      cauliflower      Greens- turnip, mustard, spinach or collards 5 Protein foods - 1 dozen eggs, chicken legs, [place] legs, tuna, canned salmon, very low fat beef- ground beef 93% fat free,  Peanut butter- small amounts at a time  1-2 Fresh fruits and vegetables- 1 can frozen orange juice-(best buy- limit to 1/2 cup a day, nanas, cabbage, carrots, celery, potatoes for baking and boiling  1-2 boxes whole grain pasta, 1 box bran flakes or special K and [place] rice  Canola oil, tub margarine, light mayonnaise  Make a follow up in 1 month

## 2011-08-18 ENCOUNTER — Telehealth: Payer: Self-pay | Admitting: Licensed Clinical Social Worker

## 2011-08-18 ENCOUNTER — Encounter: Payer: Self-pay | Admitting: Licensed Clinical Social Worker

## 2011-08-18 NOTE — Telephone Encounter (Signed)
CSW received call from Michael [name].  Pt states he recently was put on a new medication by his [facility] psychiatrist.  Pt states he is now taking Abilify.  Michael [name] is requesting CSW to provide letter to [facility] pharmacy stating he has no income.  Pt states [facility] pharmacy will provide is psychiatric medications free of charge with this letter.  Pt already has a ROI on chart, pt aware.  CSW will draft letter and fax to [facility], [phone], fax [phone]

## 2011-08-18 NOTE — Telephone Encounter (Signed)
Letter faxed to [facility] to assist patient with receiving free psychiatric medications through [facility], per pt's request.

## 2011-08-21 ENCOUNTER — Other Ambulatory Visit: Payer: Self-pay | Admitting: *Deleted

## 2011-08-21 ENCOUNTER — Encounter: Payer: Self-pay | Admitting: Internal Medicine

## 2011-08-21 DIAGNOSIS — E119 Type 2 diabetes mellitus without complications: Secondary | ICD-10-CM

## 2011-08-21 MED ORDER — INSULIN GLARGINE 100 UNIT/ML ~~LOC~~ SOLN: 25.0000 [IU] | Freq: Two times a day (BID) | SUBCUTANEOUS | Status: DC

## 2011-08-21 NOTE — Telephone Encounter (Signed)
Pt is using Lantus solostar.

## 2011-08-21 NOTE — Telephone Encounter (Signed)
Rx called in to pharmacy. 

## 2011-08-22 ENCOUNTER — Telehealth: Payer: Self-pay | Admitting: Licensed Clinical Social Worker

## 2011-08-22 NOTE — Telephone Encounter (Signed)
Michael [name] called CSW, requesting CSW to re-fax letter to [facility] pharmacy.  This letter to assist Michael [name] with obtaining Abilify at no cost.  As Michael [name] at pharmacy states she had not received faxed letter.  CSW re-faxed letter and provided Michael [name] with copy of letter and confirmations of both faxes.  Michael [name] states he is in need of 3 bus passes for his medical, pharmacy and [facility] appt.  CSW provided Michael [name] with 3 one way bus fare passes.

## 2011-08-25 ENCOUNTER — Encounter: Payer: Self-pay | Admitting: Internal Medicine

## 2011-08-25 ENCOUNTER — Ambulatory Visit (INDEPENDENT_AMBULATORY_CARE_PROVIDER_SITE_OTHER): Payer: Self-pay | Admitting: Internal Medicine

## 2011-08-25 VITALS — BP 126/96 | HR 89 | Temp 98.6°F | Ht 65.0 in | Wt 204.9 lb

## 2011-08-25 DIAGNOSIS — IMO0001 Reserved for inherently not codable concepts without codable children: Secondary | ICD-10-CM

## 2011-08-25 DIAGNOSIS — E1165 Type 2 diabetes mellitus with hyperglycemia: Secondary | ICD-10-CM

## 2011-08-25 DIAGNOSIS — B354 Tinea corporis: Secondary | ICD-10-CM

## 2011-08-25 DIAGNOSIS — Z23 Encounter for immunization: Secondary | ICD-10-CM

## 2011-08-25 DIAGNOSIS — IMO0002 Reserved for concepts with insufficient information to code with codable children: Secondary | ICD-10-CM

## 2011-08-25 DIAGNOSIS — Z733 Stress, not elsewhere classified: Secondary | ICD-10-CM

## 2011-08-25 DIAGNOSIS — Z0000 Encounter for general adult medical examination without abnormal findings: Secondary | ICD-10-CM | POA: Insufficient documentation

## 2011-08-25 DIAGNOSIS — Z658 Other specified problems related to psychosocial circumstances: Secondary | ICD-10-CM

## 2011-08-25 MED ORDER — CLOTRIMAZOLE 1 % EX CREA: TOPICAL_CREAM | Freq: Two times a day (BID) | CUTANEOUS | Status: DC

## 2011-08-25 MED ORDER — OXYCODONE-ACETAMINOPHEN 10-325 MG PO TABS: 1.0000 | ORAL_TABLET | Freq: Four times a day (QID) | ORAL | Status: DC | PRN

## 2011-08-25 NOTE — Assessment & Plan Note (Addendum)
Pt checks BGs TID and glucometer was read with readings still in 320s-400s post-prandial. Pt reports taking 25 units lantus BID and pt Metformin was increased to 500mg  BID at last visit on 08/02/11 and pt reports compliance with no side effects. Pt fasting glucose still high in 200s. Also wanted to give pt a simple management plan that could be followed and will need close f/u until good regiment found. Pts HgA1c on 7/13 was 8.6 much improved from 12.1 previously. - Inceased Lantus to 55 units QHS -continued Metformin 500 mg BID -f/u in 2-3 weeks to see new management and consider adding meal coverage such as Novolog

## 2011-08-25 NOTE — Progress Notes (Signed)
Subjective:   Patient ID: Michael [name] male   DOB: [birth year] 56 y.o.   MRN: [id number]  HPI: Michael [name] is a 56 y.o. AA man here for f/u on his diabetes management. He was recently seen in the clinic on 08/02/11 and had an increase in his metformin to 1000mg  daily. He states he has been compliant with that change and has still noticed that his BGs are in upper 300s-400s especially after meals. He feels sometimes fatigued and attributes that to his poorly controlled DM. He has some chronic abdominal pain 2/2 to chronic pancreatitis and is follow by Dr. Baldo Ash in GI, but is requesting some percocet until his GI appointment on 09/12/11. He recently had some severe itching and reports that he was given hydroxyzine by his GI physician and has doubled his dose now with some good results but not resolution. He has very complicated living situation (incarceration, homeless shelter, and on the street all w/in last couple of years) and hx of very poor family support. Currently he is more stable living with his sister and she is supportive of his life style changes and seems to be encouraging him as well. He states that his mood is greatly improved by his care at [facility]. He is less angry, not homicidal, and not suicidal at this time. He has stopped drinking alcohol completly and is working on stopping smoking also at this time. He currently smokes 0.3ppd and no other illicit drugs. In terms of health care maintenance he is willing to receive his Tdap and scheduled colonoscopy on 09/12/11. His HTN is well controlled.    Past Medical History  Diagnosis Date  . Chronic pancreatitis   . Smoker   . Diabetes mellitus     diet controlled  . Gastritis due to alcohol without hemorrhage   . HTN (hypertension)   . Pancreatic lesion     appears innocent and related to chronic pancreatitis  . Heavy alcohol use     hx of  . Marijuana use     hx of  . Migraine   . Depression   . Helicobacter pylori gastritis  03/27/2011    on endoscopy, treated with omeprazole, clarithromycin and amoxicillin  . Insomnia    Current Outpatient Prescriptions  Medication Sig Dispense Refill  . ARIPiprazole (ABILIFY) 10 MG tablet Take 10 mg by mouth daily.      Marland Kitchen FLUoxetine (PROZAC) 20 MG capsule Take 40 mg by mouth daily.       Marland Kitchen glucose blood (AGAMATRIX PRESTO TEST) test strip Use as instructed  100 each  12  . hydrochlorothiazide (HYDRODIURIL) 25 MG tablet Take 1 tablet (25 mg total) by mouth daily.  31 tablet  3  . insulin glargine (LANTUS) 100 UNIT/ML injection Inject 25 Units into the skin 2 (two) times daily.  10 mL  0  . metFORMIN (GLUCOPHAGE) 500 MG tablet Take 2 tablets (1,000 mg total) by mouth 2 (two) times daily with a meal.  120 tablet  5  . omeprazole (PRILOSEC) 20 MG capsule Take 1 capsule (20 mg total) by mouth daily.  30 capsule  11  . oxyCODONE-acetaminophen (PERCOCET) 10-325 MG per tablet Take 1 tablet by mouth every 6 (six) hours as needed for pain.  30 tablet  0  . clotrimazole (LOTRIMIN) 1 % cream Apply topically 2 (two) times daily.  40 g  1  . hydrOXYzine (ATARAX/VISTARIL) 10 MG tablet Take 10 mg by mouth 3 (three) times daily as needed.      Marland Kitchen  Insulin Pen Needle 31G X 5 MM MISC 1 each by Does not apply route daily.  100 each  11  . MOVIPREP 100 G SOLR Use per prep instructions  1 kit  0   Family History  Problem Relation Age of Onset  . Colon cancer Neg Hx   . Stomach cancer Neg Hx   . Anesthesia problems Neg Hx   . Hypotension Neg Hx   . Pseudochol deficiency Neg Hx   . Malignant hyperthermia Neg Hx   . Hypertension Sister   . Diabetes Sister   . Heart disease Sister    History   Social History  . Marital Status: Single    Spouse Name: N/A    Number of Children: 0  . Years of Education: N/A   Occupational History  . 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 Smoking  status: Current Everyday Smoker -- 0.1 packs/day    Types: Cigarettes  . Smokeless tobacco: Never Used   Comment: info given 03/14/2011  . Alcohol Use: No     sobriety since 2/13  . Drug Use: Yes    Special: Marijuana  . Sexually Active: None   Other Topics Concern  . None   Social History Narrative   Has been living at 3 different friend's homes since discharge 01/2011. Was living in the shelter prior to admission. Had previously lived with his niece and other family members but he has gotten kicked out each time.    Review of Systems: Pertinent ROS stated in HPI  Objective:  Physical Exam: Filed Vitals:   08/25/11 1331  BP: 126/96  Pulse: 89  Temp: 98.6 F (37 C)  TempSrc: Oral  Height: 5\' 5"  (1.651 m)  Weight: 204 lb 14.4 oz (92.942 kg)   General: well nourished, NAD HEENT: PERRL, EOMI, no scleral icterus, left facial raised nodule 1mm in diameter Cardiac: RRR, no rubs, murmurs or gallops Pulm: clear to auscultation bilaterally, moving normal volumes of air Abd: obese, soft, nontender, nondistended, BS present, no organomegaly Ext: warm and well perfused, no pedal edema, some execrations on back and along right arm, some circular puritic lesions along back and right arm Neuro: alert and oriented X3, cranial nerves II-XII grossly intact  Assessment & Plan:   Pt was seen and discussed with Dr. Josem Kaufmann. Pt will f/u on DM management in 2-3 wks.

## 2011-08-25 NOTE — Assessment & Plan Note (Signed)
Pt has an improvement in his current living situation living with his sister who is both financially and personally supportive of the pt and his care.  -encouraged pt to continue seeking out opportunities to work and develop personal hobbies -pt will continue seeing [facility] for psych care

## 2011-08-25 NOTE — Assessment & Plan Note (Signed)
Pts rash appears to be tinea corporis in nature and pt unclear if anyone at homeless shelter may have had similar skin findings. Given continued itching and non-elevated bilirubin don't think hydroxyzine will help terminate itching.  - Lotrim 1% cream for 2 weeks -d/c hydroxyzine for period using cream

## 2011-08-25 NOTE — Assessment & Plan Note (Signed)
Pt received Tdap shot today. Pt has scheduled colonoscopy on 09/12/11. And then will be up to date on current health care maintenance screening.

## 2011-08-25 NOTE — Progress Notes (Signed)
INTERNAL MEDICINE TEACHING ATTENDING ADDENDUM - Rocco Serene, MD: I personally saw and evaluated Mr. Severs in this clinic visit in conjunction with the resident, Dr. Burtis Junes. I have discussed the patient's plan of care with Dr. Burtis Junes during this visit. I have confirmed the physical exam findings and have read and agree with the clinic note including the plan.

## 2011-08-25 NOTE — Patient Instructions (Signed)
Diabetes: We have increased your insulin Lantus to 55 units at bedtime. And continue to take your metformin as scheduled at bedtime and in the morning. I would like to see you in 10-14 days to see how the new changes are working.  Itching: We think you have a fungal infection and we are given you a cream Clotrimazole or Lotrim. Please use it instead of the hydroxyzine pills. The cream should be used for 2 weeks.   Mood: Proud of your current accomplishments. Please continue going to [employer] and cooking.   Thank you and enjoyed meeting you. Good luck with everything.

## 2011-08-30 ENCOUNTER — Encounter: Payer: Self-pay | Admitting: Internal Medicine

## 2011-08-30 ENCOUNTER — Telehealth: Payer: Self-pay | Admitting: Licensed Clinical Social Worker

## 2011-08-30 NOTE — Telephone Encounter (Signed)
CSW met with [name] in person on 08/29/11.  This visit is an unscheduled social visit to update CSW on pt's current status.  [name] states he has not heard from the [facility] care manager or [employer].  CSW followed up with email to [facility] nutrition pantry.  [facility] pantry states pt's must be in active case management.  CSW informed [facility] pt is agreeable and has yet to make contact with [name].  CSW received message this am from [name].  Contact has been made, pt currently in active care management.  [facility] requesting most recent office visit notes and labs to assist in care management.  [name] in agreement.  Chart info faxed to [facility]

## 2011-09-01 NOTE — Telephone Encounter (Signed)
[facility], L. Broadnax, has made contact with Mr. Mcevoy and delivered pantry items.

## 2011-09-04 ENCOUNTER — Telehealth: Payer: Self-pay | Admitting: Licensed Clinical Social Worker

## 2011-09-04 ENCOUNTER — Telehealth: Payer: Self-pay | Admitting: Internal Medicine

## 2011-09-04 NOTE — Telephone Encounter (Signed)
Left message for patient to call back  

## 2011-09-04 NOTE — Telephone Encounter (Signed)
[name] placed call in to CSW.  Pt states he received applesauce and green beans, no meat, from [facility] case worker, L. Broadnax.  Pt requesting if CSW had any [name].  Unfortunately CSW does not at this time.  [name] receives food stamps on the 21st.  Last month, Michael [name] states he used his food stamps on inappropriate food for a diabetic.  CSW informed [name] that CSW will request [facility] RD to contact pt at alternate phone number.  [name] in agreement.

## 2011-09-05 ENCOUNTER — Telehealth: Payer: Self-pay | Admitting: Licensed Clinical Social Worker

## 2011-09-05 NOTE — Telephone Encounter (Signed)
Patient was calling for rx of oxycodone that he had prescribed by Dr. Burtis Junes.  He is advised that he will need to contact her office for a rx.  He is asked to keep the appt for colonoscopy 09/12/11

## 2011-09-05 NOTE — Telephone Encounter (Signed)
Left message for patient to call back  

## 2011-09-05 NOTE — Telephone Encounter (Signed)
CSW received call from [name] this afternoon.  Pt states he has multiple upcoming appointments with GI and PCP.  Pt voiced concern that he may have cancer because he is in need of a colonoscopy.  [name] also voiced concern regarding his Insulin.  Pt states "it makes my stomach hard and swollen, maybe I need to find another place to shoot it".  Pt also c/o Rt leg weakness while walking and constant red eyes.  [name] relates his weakness and eyes issues to the switch in his insulin.  CSW encouraged [name] to discuss his current concerns at his PCP appt on 8/15.  CSW will confirm if inpt CSW was able to secure a 31 day pass for [employer], if not this CSW will provide [name] with 4 [employer] one-way passes.

## 2011-09-07 ENCOUNTER — Encounter: Payer: Self-pay | Admitting: Licensed Clinical Social Worker

## 2011-09-07 ENCOUNTER — Encounter: Payer: Self-pay | Admitting: Internal Medicine

## 2011-09-07 ENCOUNTER — Ambulatory Visit (INDEPENDENT_AMBULATORY_CARE_PROVIDER_SITE_OTHER): Payer: Self-pay | Admitting: Internal Medicine

## 2011-09-07 VITALS — BP 152/100 | HR 77 | Temp 98.7°F | Wt 209.4 lb

## 2011-09-07 DIAGNOSIS — B354 Tinea corporis: Secondary | ICD-10-CM

## 2011-09-07 DIAGNOSIS — E1165 Type 2 diabetes mellitus with hyperglycemia: Secondary | ICD-10-CM

## 2011-09-07 DIAGNOSIS — IMO0001 Reserved for inherently not codable concepts without codable children: Secondary | ICD-10-CM

## 2011-09-07 DIAGNOSIS — IMO0002 Reserved for concepts with insufficient information to code with codable children: Secondary | ICD-10-CM

## 2011-09-07 MED ORDER — OXYCODONE-ACETAMINOPHEN 10-325 MG PO TABS: 1.0000 | ORAL_TABLET | Freq: Four times a day (QID) | ORAL | Status: DC | PRN

## 2011-09-07 NOTE — Progress Notes (Signed)
Subjective:   Patient ID: Michael [name] male   DOB: 1955-04-10 56 y.o.   MRN: [id number]  HPI: [name] is a 56 y.o. African American man with a past medical history of poorly-controlled diabetes, chronic alcoholic pancreatitis, hypertension, and depression presents for two-week followup on new diabetes management. He reports taking metformin 500 mg twice a day and Lantus 55 units at bedtime. He states that he injects his insulin into his upper abdomen and that causes some degree of pain. He states that he checks his blood glucose levels 3 times a day usually after meals. His glucometer today shows postprandial readings in the 200s and some preprandial in the 130s to 150s, he doesn't check an AM reading before breakfast. He reports that overall he is feeling better with less shaky episodes and moments of dizziness. He feels he has more energy and is receiving very good support from his sister and nieces towards his diabetes management. His other concern today is his upcoming appointment with GI on 8/20 for a colonoscopy and investigation into an incidental mass found in the pancreas tail that couldn't be ruled out for early malignancy. He continues to have some moderate abdominal pain that is relieved with Percocet, he is asking for refill of that medication today. He denied any diarrhea, constipation, nausea, vomiting, or hematochezia. In terms of his mood he believes it is much improved, he continues to see [facility] for counseling and continues to seek out avenues to develop hobbies. He is currently cooking and reading his bible at a park to occupy his time. He is optimistic and happy today with no other complaints.   Past Medical History  Diagnosis Date  . Chronic pancreatitis   . Smoker   . Diabetes mellitus     diet controlled  . Gastritis due to alcohol without hemorrhage   . HTN (hypertension)   . Pancreatic lesion     appears innocent and related to chronic pancreatitis  . Heavy  alcohol use     hx of  . Marijuana use     hx of  . Migraine   . Depression   . Helicobacter pylori gastritis 03/27/2011    on endoscopy, treated with omeprazole, clarithromycin and amoxicillin  . Insomnia    Current Outpatient Prescriptions  Medication Sig Dispense Refill  . ARIPiprazole (ABILIFY) 10 MG tablet Take 10 mg by mouth daily.      . clotrimazole (LOTRIMIN) 1 % cream Apply topically 2 (two) times daily.  40 g  1  . FLUoxetine (PROZAC) 20 MG capsule Take 40 mg by mouth daily.       Marland Kitchen glucose blood (AGAMATRIX PRESTO TEST) test strip Use as instructed  100 each  12  . hydrochlorothiazide (HYDRODIURIL) 25 MG tablet Take 1 tablet (25 mg total) by mouth daily.  31 tablet  3  . hydrOXYzine (ATARAX/VISTARIL) 10 MG tablet Take 10 mg by mouth 3 (three) times daily as needed.      . insulin glargine (LANTUS) 100 UNIT/ML injection Inject 25 Units into the skin 2 (two) times daily.  10 mL  0  . Insulin Pen Needle 31G X 5 MM MISC 1 each by Does not apply route daily.  100 each  11  . metFORMIN (GLUCOPHAGE) 500 MG tablet Take 2 tablets (1,000 mg total) by mouth 2 (two) times daily with a meal.  120 tablet  5  . MOVIPREP 100 G SOLR Use per prep instructions  1 kit  0  .  omeprazole (PRILOSEC) 20 MG capsule Take 1 capsule (20 mg total) by mouth daily.  30 capsule  11  . oxyCODONE-acetaminophen (PERCOCET) 10-325 MG per tablet Take 1 tablet by mouth every 6 (six) hours as needed for pain.  10 tablet  0   Family History  Problem Relation Age of Onset  . Colon cancer Neg Hx   . Stomach cancer Neg Hx   . Anesthesia problems Neg Hx   . Hypotension Neg Hx   . Pseudochol deficiency Neg Hx   . Malignant hyperthermia Neg Hx   . Hypertension Sister   . Diabetes Sister   . Heart disease Sister    History   Social History  . Marital Status: Single    Spouse Name: N/A    Number of Children: 0  . Years of Education: N/A   Occupational History  . 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 Smoking status: Current Everyday Smoker -- 0.1 packs/day    Types: Cigarettes  . Smokeless tobacco: Never Used   Comment: info given 03/14/2011  . Alcohol Use: No     sobriety since 2/13  . Drug Use: Yes    Special: Marijuana  . Sexually Active: None   Other Topics Concern  . None   Social History Narrative   Has been living at 3 different friend's homes since discharge 01/2011. Was living in the shelter prior to admission. Had previously lived with his niece and other family members but he has gotten kicked out each time.    Review of Systems: Pertinent listed in HPI  Objective:  Physical Exam: Filed Vitals:   09/07/11 1350  BP: 152/100  Pulse: 77  Temp: 98.7 F (37.1 C)  TempSrc: Oral  Weight: 209 lb 6.4 oz (94.983 kg)   General: NAD, well nourished HEENT: PERRL, EOMI, no scleral icterus, raised 1cm circular mass over lateral side of left eye Cardiac: RRR, no rubs, murmurs or gallops Pulm: clear to auscultation bilaterally, moving normal volumes of air Abd: soft, nontender, nondistended, BS present Ext: warm and well perfused, no pedal edema, some raised puritic lesions on back and right arm Neuro: alert and oriented X3, cranial nerves II-XII grossly intact Psych: Mood optimistic and positive outlook  Assessment & Plan:  Patient was seen and discussed with Dr. Criselda Peaches.  Patient will followup in about 6 weeks to reevaluate blood glucose levels and followup on GI procedure scheduled for 09/12/11.

## 2011-09-07 NOTE — Assessment & Plan Note (Addendum)
Pt followed new proposed management of Metformin 500mg  BID and Lantus 55 Units at bedtime. He has been compliant but had improper technique that caused some pain. Patient brought in glucometer with preprandial readings in the 130s and postprandial readings in the 200s. That is much improved from previous readings. He continues to make better food choices and exercising daily. His last hemoglobin A1c on 7/13 was 8.6 do not believe repeat testing needed today. His support system continues to improve with both his sister and niece is helping him monitor his diabetes management. -Continue metformin 500 mg twice a day and Lantus 55 units at bedtime with no new changes -Patient was educated on proper technique for insulin administration and the importance of rotating sites -The patient was given information on diabetes management and hemoglobin A1c to share with family -Patient requested referral for eye exam -Followup with patient in 6 weeks to monitor glucose readings and possibly repeat hemoglobin A1c at that time

## 2011-09-07 NOTE — Assessment & Plan Note (Signed)
Pt reports compliance with Lotrimin cream and that is improving rash and greatly reduced his itching. Patient continues to not take hydroxyzine while he is using the cream. -pt still not finished treatment  -will continue to monitor -pt didn't need refill at this time

## 2011-09-07 NOTE — Progress Notes (Signed)
[name] presents today 3 hrs early for his Michael [name] appt.  Pt states his Michael [name] appt was canceled, pt decided to wait at Michael [name] instead of going back home.  Pt shared GI information from CSW.  Pt and family had reviewed his GI chart notes and voiced some concern regarding Michael [name] and being worried about possible dx.  [name] states she sister has been supportive and still allows him to live with her.  Pt voiced assurance in his GI physician.  CSW discussed pt's addition of Michael [name] to his medication regime and if pt felt positive about the change.  [name] states his family has noticed the biggest change, [name]  Pt becomes easily agitated when not taking the Michael [name].  [name] inquiring about bus pass and informed CSW inpt CSW is on vacation.  CSW provided [name] with 3 one way bus fare and pantry items.  [name] received toiletries (soap and lotion) and pantry items.  Pt will receive his food stamps next week.  [name] has made contact with [facility] case worker and RD.

## 2011-09-07 NOTE — Patient Instructions (Signed)
It was great to see you again.  I am so happy that your diabetes is improving!! Good work. Your numbers are much better and keep doing what you are doing. Continue to take the Metformin 500mg  twice a day and the Lantus 55Units at night. I will see you back in 6 weeks to check your numbers and see how your diabetes is going. I hope the chart will be helpful as well. Please share with your family.  Also in 6 weeks I will check on you after your procedure with the stomach doctors. Hope it goes well.   Happy to hear you are cooking and reading. Keep seeing your counselor at [facility] and keep cooking.   Thank you. Please don't hesitate to call the clinic with any questions.

## 2011-09-08 NOTE — Progress Notes (Signed)
INTERNAL MEDICINE TEACHING ATTENDING ADDENDUM - Inez Catalina, MD: I personally saw and evaluated Michael [name] in this clinic visit in conjunction with the resident, Dr. Burtis Junes. I have discussed patient's plan of care with medical resident during this visit. I have confirmed the physical exam findings and have read and agree with the clinic note including the plan with the following addition:  For pain control, will give patient 10 percocet to last him until his GI appointment.  It appears that the pain may be related to insulin injections (in the upper abdomen).  Have educated patient about proper injection technique and will re-evaluate at next visit.

## 2011-09-11 ENCOUNTER — Other Ambulatory Visit: Payer: Self-pay | Admitting: *Deleted

## 2011-09-11 DIAGNOSIS — E119 Type 2 diabetes mellitus without complications: Secondary | ICD-10-CM

## 2011-09-11 MED ORDER — INSULIN GLARGINE 100 UNIT/ML ~~LOC~~ SOLN: 25.0000 [IU] | Freq: Two times a day (BID) | SUBCUTANEOUS | Status: DC

## 2011-09-12 ENCOUNTER — Encounter: Payer: Self-pay | Admitting: Internal Medicine

## 2011-09-12 ENCOUNTER — Ambulatory Visit (AMBULATORY_SURGERY_CENTER): Payer: Self-pay | Admitting: Internal Medicine

## 2011-09-12 ENCOUNTER — Other Ambulatory Visit: Payer: Self-pay | Admitting: Internal Medicine

## 2011-09-12 VITALS — BP 135/93 | HR 97 | Temp 97.1°F | Resp 18 | Ht 65.0 in | Wt 202.0 lb

## 2011-09-12 DIAGNOSIS — D126 Benign neoplasm of colon, unspecified: Secondary | ICD-10-CM

## 2011-09-12 DIAGNOSIS — Z538 Procedure and treatment not carried out for other reasons: Secondary | ICD-10-CM

## 2011-09-12 DIAGNOSIS — Z1211 Encounter for screening for malignant neoplasm of colon: Secondary | ICD-10-CM

## 2011-09-12 HISTORY — PX: COLONOSCOPY W/ BIOPSIES AND POLYPECTOMY: SHX1376

## 2011-09-12 MED ORDER — INSULIN GLARGINE 100 UNIT/ML ~~LOC~~ SOLN: 55.0000 [IU] | Freq: Every day | SUBCUTANEOUS | Status: DC

## 2011-09-12 MED ORDER — SODIUM CHLORIDE 0.9 % IV SOLN: 500.0000 mL | INTRAVENOUS | Status: DC

## 2011-09-12 MED ORDER — PANCRELIPASE (LIP-PROT-AMYL) 36000-114000 UNITS PO CPEP: 1.0000 | ORAL_CAPSULE | Freq: Three times a day (TID) | ORAL | Status: DC

## 2011-09-12 NOTE — Op Note (Signed)
[facility] [address] [place], [address]   COLONOSCOPY PROCEDURE REPORT  PATIENT: Michael [name], Michael [name]  MR#: [id number] BIRTHDATE: 13-Jun-1955 , 55  yrs. old GENDER: Male ENDOSCOPIST: [name], MD, [facility] REFERRED BY: PROCEDURE DATE:  09/12/2011 PROCEDURE:   Colonoscopy with snare polypectomy ASA CLASS:   Class III INDICATIONS:average risk screening. MEDICATIONS: propofol (Diprivan) 250mg  IV, MAC sedation, administered by CRNA, and These medications were titrated to patient response per physician's verbal order  DESCRIPTION OF PROCEDURE:   After the risks benefits and alternatives of the procedure were thoroughly explained, informed consent was obtained.  A digital rectal exam revealed the prostate was not enlarged.   The LB [id number]  endoscope was introduced through the anus and advanced to the   . No adverse events experienced.   The quality of the prep was poor, using MoviPrep  The instrument was then slowly withdrawn as the colon was fully examined.      COLON FINDINGS: A diminutive sessile polyp was found in the transverse colon.  A polypectomy was performed with a cold snare. The resection was complete and the polyp tissue was completely retrieved.   A significant amount of stool was present throughout the entire examined colon.    Retroflexed views revealed no abnormalities.  The time to cecum = 1:42 minutes  Withdrawal time= 4:09 minutes.  The scope was withdrawn and the procedure completed. COMPLICATIONS: There were no complications. ENDOSCOPIC IMPRESSION: 1.   Diminutive sessile polyp was found in the transverse colon; polypectomy was performed with a cold snare 2.   Significant amount of stool was present throughout the entire examined colon - poor prep and incomplete exam  RECOMMENDATIONS: 1) Schedule office appointment (patient to call) for September or October to discuss repeating colonoscopy and follow-up pancreatitis 2)  start Creon 36,000 U with meals for pancreatic pain and chronic pancreatitis  eSigned:  [name], MD, [facility] 09/12/2011 1:59 PM   cc:

## 2011-09-12 NOTE — Patient Instructions (Addendum)
The colon was not cleaned out well though I did find and remove one small polyp.  1) take the Creon samples from my office - 1 with meals - this can help your pancreatic pain. 2) schedule an office visit to see me in September or October so we can see how you are and talk about trying the colonoscopy again with a better prep.  Thank you for choosing me and [facility].  [name], MD, FACG YOU HAD AN ENDOSCOPIC PROCEDURE TODAY AT THE  ENDOSCOPY CENTER: Refer to the procedure report that was given to you for any specific questions about what was found during the examination.  If the procedure report does not answer your questions, please call your gastroenterologist to clarify.  If you requested that your care partner not be given the details of your procedure findings, then the procedure report has been included in a sealed envelope for you to review at your convenience later.  YOU SHOULD EXPECT: Some feelings of bloating in the abdomen. Passage of more gas than usual.  Walking can help get rid of the air that was put into your GI tract during the procedure and reduce the bloating. If you had a lower endoscopy (such as a colonoscopy or flexible sigmoidoscopy) you may notice spotting of blood in your stool or on the toilet paper. If you underwent a bowel prep for your procedure, then you may not have a normal bowel movement for a few days.  DIET: Your first meal following the procedure should be a light meal and then it is ok to progress to your normal diet.  A half-sandwich or bowl of soup is an example of a good first meal.  Heavy or fried foods are harder to digest and may make you feel nauseous or bloated.  Likewise meals heavy in dairy and vegetables can cause extra gas to form and this can also increase the bloating.  Drink plenty of fluids but you should avoid alcoholic beverages for 24 hours.  ACTIVITY: Your care partner should take you home directly after the  procedure.  You should plan to take it easy, moving slowly for the rest of the day.  You can resume normal activity the day after the procedure however you should NOT DRIVE or use heavy machinery for 24 hours (because of the sedation medicines used during the test).    SYMPTOMS TO REPORT IMMEDIATELY: A gastroenterologist can be reached at any hour.  During normal business hours, 8:30 AM to 5:00 PM Monday through Friday, call [phone].  After hours and on weekends, please call the GI answering service at [phone] who will take a message and have the physician on call contact you.   Following lower endoscopy (colonoscopy or flexible sigmoidoscopy):  Excessive amounts of blood in the stool  Significant tenderness or worsening of abdominal pains  Swelling of the abdomen that is new, acute  Fever of 100F or higher  Following upper endoscopy (EGD)  Vomiting of blood or coffee ground material  New chest pain or pain under the shoulder blades  Painful or persistently difficult swallowing  New shortness of breath  Fever of 100F or higher  Black, tarry-looking stools  FOLLOW UP: If any biopsies were taken you will be contacted by phone or by letter within the next 1-3 weeks.  Call your gastroenterologist if you have not heard about the biopsies in 3 weeks.  Our staff will call the home number listed on your records the  next business day following your procedure to check on you and address any questions or concerns that you may have at that time regarding the information given to you following your procedure. This is a courtesy call and so if there is no answer at the home number and we have not heard from you through the emergency physician on call, we will assume that you have returned to your regular daily activities without incident.  SIGNATURES/CONFIDENTIALITY: You and/or your care partner have signed paperwork which will be entered into your electronic medical record.  These  signatures attest to the fact that that the information above on your After Visit Summary has been reviewed and is understood.  Full responsibility of the confidentiality of this discharge information lies with you and/or your care-partner.

## 2011-09-12 NOTE — Progress Notes (Signed)
Patient did not experience any of the following events: a burn prior to discharge; a fall within the facility; wrong site/side/patient/procedure/implant event; or a hospital transfer or hospital admission upon discharge from the facility. (G8907) Patient did not have preoperative order for IV antibiotic SSI prophylaxis. (G8918)  

## 2011-09-13 ENCOUNTER — Telehealth: Payer: Self-pay | Admitting: *Deleted

## 2011-09-13 NOTE — Telephone Encounter (Signed)
No answer, message left for the patient. 

## 2011-09-14 ENCOUNTER — Other Ambulatory Visit: Payer: Self-pay | Admitting: Internal Medicine

## 2011-09-14 MED ORDER — INSULIN GLARGINE 100 UNIT/ML ~~LOC~~ SOLN: 55.0000 [IU] | Freq: Every day | SUBCUTANEOUS | Status: DC

## 2011-09-14 NOTE — Telephone Encounter (Signed)
Rx phoned in.   

## 2011-09-15 ENCOUNTER — Telehealth: Payer: Self-pay | Admitting: Internal Medicine

## 2011-09-15 NOTE — Telephone Encounter (Signed)
Patient calling to report diarrhea yesterday and today. States he had diarrhea x 6 today. He has seen blood in stool and when he wipes. He is also c/o bloating. He was afraid the "H. Pylori" was back. Discussed with patient that this could be results from prep he took for procedure and the bloating from air used during the procedure. He understands to call if diarrhea continues.

## 2011-09-18 ENCOUNTER — Telehealth: Payer: Self-pay | Admitting: Licensed Clinical Social Worker

## 2011-09-18 ENCOUNTER — Telehealth: Payer: Self-pay | Admitting: Internal Medicine

## 2011-09-18 NOTE — Telephone Encounter (Signed)
Patient wants discuss being tested again for h. Pylori and he also wants to have a repeat MRI to evaluate the pancrease.  He will come for an office visit on 09/27/11

## 2011-09-18 NOTE — Telephone Encounter (Signed)
Michael [name] called in to this CSW and informed CSW of appt with GI physician and possible biopsy.  CSW provided emotional support and encouragement.  During this discussion, pt brought up option of a referral to the Adult Dental Clinic using [facility].  CSW discussed with nursing staff and informed dental clinic does not do preventative care.  Pt may be able to obtain comprehensive exam.  CSW will explore cost of Adult clinic vs. [facility] and provide feedback to Michael [name].

## 2011-09-21 ENCOUNTER — Encounter: Payer: Self-pay | Admitting: Internal Medicine

## 2011-09-21 NOTE — Progress Notes (Signed)
Quick Note:  Diminutive adenoma Poor prep Patient is to see me in office to discuss prepping again and repeating colonoscopy this year ______

## 2011-09-22 ENCOUNTER — Telehealth: Payer: Self-pay | Admitting: Licensed Clinical Social Worker

## 2011-09-22 NOTE — Telephone Encounter (Signed)
Earlier this week, CSW met with Michael [name].  Pt discussed GI concerns and updates, CSW offered pt emotional support and encouragement.  Michael [name] provided information received from his [facility] therapist on Dept Vocational Rehab.  Pt voiced this as an option to assist him with obtaining his GED.  CSW offered Michael [name] information on [employer] as a resources as well.  Michael [name] voiced anticipation to attending classes to obtain his GED.  CSW provided Michael [name] with 2 bus tickets to assist with job/GED training.   CSW received call, pt has signed up for the next GED session at [employer].  Provided CSW with contact phone number to follow up.  CSW has number [phone], no need for CSW to follow up at this time.  Pt also inquiring about services from [employer] on [place], CSW provided Michael [name] with contact number.

## 2011-09-26 ENCOUNTER — Telehealth: Payer: Self-pay | Admitting: Licensed Clinical Social Worker

## 2011-09-26 NOTE — Telephone Encounter (Signed)
Mr. Franko calling in to CSW requesting 3 bus tickets.  Pt states he lost his wallet which had his ID, [employer], etc.  CSW inquired if pt contacted [facility] lost & found and [employer] bus lost & found.  Pt states he had but no wallets had been found.  Mr. Hodapp states he has one [facility] appt this week and one GI appt this week.  CSW explained to pt of limited number of bus passes available to the entire [facility], bus passes are not unlimited in [facility] and must be used for the entire client populations.  CSW informed Mr. Balaban 3 passes will be left at front desk.  These are all the passes available through Sept 2013 for Mr. Koppelman.

## 2011-09-27 ENCOUNTER — Ambulatory Visit: Payer: Self-pay | Admitting: Internal Medicine

## 2011-10-02 ENCOUNTER — Telehealth: Payer: Self-pay | Admitting: Licensed Clinical Social Worker

## 2011-10-02 ENCOUNTER — Telehealth: Payer: Self-pay | Admitting: Internal Medicine

## 2011-10-02 NOTE — Telephone Encounter (Signed)
Michael [name] placed call to CSW to provide recap of current finding from [facility] on his disability.  Pt states he does have hearing coming up but attorney his handling that hearing.  Pt states he need to schedule an appt this Friday in [facility] and may need a bus pass home from appt.  CSW informed Michael [name], if needed CSW will leave bus pass at front desk.

## 2011-10-02 NOTE — Telephone Encounter (Signed)
Patient's wife that answered the phone stated that Michael [name] has all his information and will call back if he has any questions

## 2011-10-03 NOTE — Telephone Encounter (Signed)
After exploring options, most economical for dental cleaning would be [facility] or [employer].  CSW will leave information for [name] along with Bus pass for his 9/13 appt.

## 2011-10-06 ENCOUNTER — Encounter: Payer: Self-pay | Admitting: Internal Medicine

## 2011-10-06 ENCOUNTER — Ambulatory Visit (INDEPENDENT_AMBULATORY_CARE_PROVIDER_SITE_OTHER): Payer: Self-pay | Admitting: Internal Medicine

## 2011-10-06 VITALS — BP 124/87 | HR 76 | Temp 97.1°F | Wt 213.1 lb

## 2011-10-06 DIAGNOSIS — Z23 Encounter for immunization: Secondary | ICD-10-CM

## 2011-10-06 DIAGNOSIS — K860 Alcohol-induced chronic pancreatitis: Secondary | ICD-10-CM

## 2011-10-06 DIAGNOSIS — K861 Other chronic pancreatitis: Secondary | ICD-10-CM

## 2011-10-06 MED ORDER — OXYCODONE-ACETAMINOPHEN 10-325 MG PO TABS: 1.0000 | ORAL_TABLET | Freq: Four times a day (QID) | ORAL | Status: DC | PRN

## 2011-10-06 NOTE — Progress Notes (Signed)
HPI:  [name] is a 56 yo man with PMH of chronic pancreatitis 2/2 alcohol, DM presents today for his chronic left/right flank pain as well as his upper quadrant, constant, 7-8/10 in severity, cannot sleep at night.  He has nausea and vomiting once per day. He is able to keep food down. No fever.  +chills.  No weight loss.  Had colonoscopy in 08/2011 and bx showed "a small benign but pre-cancerous polyp"  And he will need to repeat colonoscopy since the colon was not clean enough for  Dr. Leone Payor for him to see well". He states that he was on Percocet in the past but stopped taking it because he didn't have the money to pay for it.  He takes over the counter pain meds without relief. He is requesting for percocet today.    ROS: as per HPI  PE: General: alert, well-developed, and cooperative to examination.  Lungs: normal respiratory effort, no accessory muscle use, normal breath sounds, no crackles, and no wheezes. Heart: normal rate, regular rhythm, no murmur, no gallop, and no rub.  Abdomen: soft, normal bowel sounds, slight distention, no guarding, no rebound tenderness, left and right flank tenderness and left upper quadrant tenderness on palpation. Msk: no joint swelling, no joint warmth, and no redness over joints.  Extremities: No cyanosis, edema Neurologic:nonfocal

## 2011-10-06 NOTE — Assessment & Plan Note (Addendum)
Left flank pain and left upper quadrant which is chronic in nature.  He has been followed by GI-Dr. Leone Payor and had colonoscopy in 08/2011 but the prep was not clean so he will need a repeat colonoscopy.  ALso there was a small adenoma-benign but precancerous.   -Will prescribe Percocet 10mg  q6hr PRN #30 -He will need to discuss and sign a pain contract with his PCP since he needs pain control with narcotics longterm -Has appt with his PCP on 10/20/11 -Continue enzymes replacement: Creons -Abstain from alcohol

## 2011-10-06 NOTE — Patient Instructions (Addendum)
You can take Percocet 10mg  one tablet every 6 hours as needed for pain You need to follow up with your primary care doctor in 2 weeks

## 2011-10-19 ENCOUNTER — Telehealth: Payer: Self-pay | Admitting: Licensed Clinical Social Worker

## 2011-10-19 NOTE — Telephone Encounter (Signed)
Michael [name] calling in to CSW to request bus passes for [facility] appt and GI appt.  CSW left 4 passes for pt.  Michael [name] awaiting approval from in-house SW dept for 31 day pass.  CSW left passes for pt at front desk.

## 2011-10-20 ENCOUNTER — Encounter: Payer: Self-pay | Admitting: Internal Medicine

## 2011-10-20 ENCOUNTER — Ambulatory Visit (INDEPENDENT_AMBULATORY_CARE_PROVIDER_SITE_OTHER): Payer: Self-pay | Admitting: Internal Medicine

## 2011-10-20 VITALS — BP 131/90 | HR 85 | Temp 97.8°F | Ht 65.0 in | Wt 218.5 lb

## 2011-10-20 DIAGNOSIS — E119 Type 2 diabetes mellitus without complications: Secondary | ICD-10-CM

## 2011-10-20 DIAGNOSIS — K861 Other chronic pancreatitis: Secondary | ICD-10-CM

## 2011-10-20 DIAGNOSIS — Z79899 Other long term (current) drug therapy: Secondary | ICD-10-CM

## 2011-10-20 DIAGNOSIS — IMO0002 Reserved for concepts with insufficient information to code with codable children: Secondary | ICD-10-CM

## 2011-10-20 DIAGNOSIS — E1165 Type 2 diabetes mellitus with hyperglycemia: Secondary | ICD-10-CM

## 2011-10-20 LAB — POCT GLYCOSYLATED HEMOGLOBIN (HGB A1C): Hemoglobin A1C: 9.2

## 2011-10-20 LAB — GLUCOSE, CAPILLARY: Glucose-Capillary: 143 mg/dL — ABNORMAL HIGH (ref 70–99)

## 2011-10-20 MED ORDER — OXYCODONE-ACETAMINOPHEN 10-325 MG PO TABS: 1.0000 | ORAL_TABLET | Freq: Four times a day (QID) | ORAL | Status: DC | PRN

## 2011-10-20 MED ORDER — INSULIN GLARGINE 100 UNIT/ML ~~LOC~~ SOLN: 57.0000 [IU] | Freq: Every day | SUBCUTANEOUS | Status: DC

## 2011-10-20 MED ORDER — METFORMIN HCL 500 MG PO TABS: 1000.0000 mg | ORAL_TABLET | Freq: Two times a day (BID) | ORAL | Status: DC

## 2011-10-20 NOTE — Progress Notes (Signed)
Subjective:   Patient ID: Michael [name] male   DOB: 1955/08/07 56 y.o.   MRN: [id number]  HPI: [name] is a 56 y.o. African American man with a past medical history of diabetes type II uncontrolled and chronic alcoholic pancreatitis presents for followup on diabetes care and recent GI appointments. The patient states that overall he is working on significant lifestyle changes such as eating healthier, cutting out fatty foods, exercising, and monitoring his sugars daily. He brings in his meters today with most of his fasting CBGs in the 3 to 400s and only one hypoglycemic episode at 66 which she correlates to not eating breakfast that day. He is able to recognize hypoglycemic symptoms and will keep something in take up reaction. His social support has dramatically increased recently and he is able to get more of his diabetes under control. He has no other complaints, has totally abstained from alcohol, and is decreasing his smoking use at this time.    Past Medical History  Diagnosis Date  . Chronic pancreatitis   . Smoker   . Diabetes mellitus     diet controlled  . Gastritis due to alcohol without hemorrhage   . HTN (hypertension)   . Pancreatic lesion     appears innocent and related to chronic pancreatitis  . Heavy alcohol use     hx of  . Marijuana use     hx of  . Migraine   . Depression   . Helicobacter pylori gastritis 03/27/2011    on endoscopy, treated with omeprazole, clarithromycin and amoxicillin  . Insomnia   . ACE inhibitor-aggravated angioedema   . Sessile colonic polyp    Current Outpatient Prescriptions  Medication Sig Dispense Refill  . ARIPiprazole (ABILIFY) 10 MG tablet Take 10 mg by mouth daily.      . clotrimazole (LOTRIMIN) 1 % cream Apply topically 2 (two) times daily.  40 g  1  . FLUoxetine (PROZAC) 20 MG capsule Take 40 mg by mouth daily.       Marland Kitchen glucose blood (AGAMATRIX PRESTO TEST) test strip Use as instructed  100 each  12  .  hydrochlorothiazide (HYDRODIURIL) 25 MG tablet Take 1 tablet (25 mg total) by mouth daily.  31 tablet  3  . hydrOXYzine (ATARAX/VISTARIL) 10 MG tablet Take 10 mg by mouth 3 (three) times daily as needed.      . insulin glargine (LANTUS SOLOSTAR) 100 UNIT/ML injection Inject 57 Units into the skin at bedtime.  15 mL  12  . Insulin Pen Needle 31G X 5 MM MISC 1 each by Does not apply route daily.  100 each  11  . metFORMIN (GLUCOPHAGE) 500 MG tablet Take 2 tablets (1,000 mg total) by mouth 2 (two) times daily with a meal.  120 tablet  5  . omeprazole (PRILOSEC) 20 MG capsule Take 1 capsule (20 mg total) by mouth daily.  30 capsule  11  . oxyCODONE-acetaminophen (PERCOCET) 10-325 MG per tablet Take 1 tablet by mouth every 6 (six) hours as needed for pain.  60 tablet  0  . Pancrelipase, Lip-Prot-Amyl, (CREON) 36000 UNITS CPEP Take 1 capsule by mouth 3 (three) times daily with meals.      Marland Kitchen DISCONTD: insulin glargine (LANTUS SOLOSTAR) 100 UNIT/ML injection Inject 55 Units into the skin at bedtime.  15 mL  12   Family History  Problem Relation Age of Onset  . Colon cancer Neg Hx   . Stomach cancer Neg Hx   .  Anesthesia problems Neg Hx   . Hypotension Neg Hx   . Pseudochol deficiency Neg Hx   . Malignant hyperthermia Neg Hx   . Hypertension Sister   . Diabetes Sister   . Heart disease Sister    History   Social History  . Marital Status: Single    Spouse Name: N/A    Number of Children: 0  . Years of Education: N/A   Occupational History  . 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 Smoking status: Current Every Day Smoker -- 0.1 packs/day    Types: Cigarettes  . Smokeless tobacco: Never Used   Comment: info given 03/14/2011  . Alcohol Use: No     sobriety since 2/13  . Drug Use: Yes    Special: Marijuana     Jan 2103 last use  . Sexually Active: None   Other Topics Concern   . None   Social History Narrative   Has been living at 3 different friend's homes since discharge 01/2011. Was living in the shelter prior to admission. Had previously lived with his niece and other family members but he has gotten kicked out each time.    Review of Systems: listed in HPI  Objective:  Physical Exam: Filed Vitals:   10/20/11 1315  BP: 131/90  Pulse: 85  Temp: 97.8 F (36.6 C)  TempSrc: Oral  Height: 5\' 5"  (1.651 m)  Weight: 218 lb 8 oz (99.111 kg)  SpO2: 100%   General: NAD, well nourished, well developed HEENT: PERRL, no scleral icterus, mass on left eye over zygomatic arch Cardiac: RRR, no rubs, murmurs or gallops Pulm: clear to auscultation bilaterally, moving normal volumes of air Abd: soft, nontender, nondistended, BS present Ext: warm and well perfused, no pedal edema Neuro: alert and oriented X3, cranial nerves II-XII grossly intact  Assessment & Plan:  1. DM: Pt HgbA1c is 9.2 which is slightly increased from 8.5 7/13. Pt is making drastic lifestyle changes in terms of diet and exercise. He was originally on complicated medication management for his DM and was unable to handle its complexity. Therefore w/in this transition period he may have been having elevated sugars. His meters show fasting sugars in 3-400s.  -increased metformin 1000mg  BID -increased lantus to 57 units QHS  2. Chronic Pancreatitis: Pt is seeing Dr. Leone Payor for evaluation into a new found pancreatic mass at the tail of the pancreas and some villous adenomatous polyp on colonoscopy. Pts questions and concerns regarding possible diagnoses was addressed. Pts pain well controlled on Percocet.  -pain contract for Percocet10-325mg  was signed today -pt continue workup in GI clinic  Pt was seen and discussed with Dr. Rogelia Boga

## 2011-10-20 NOTE — Patient Instructions (Signed)
Please take 57 units of lantus at bedtime and continue taking your metformin 1000 mg BID. Keep working on exercise and good diet changes. So proud of you and your great family.   We did sign a pain contract today to help you with your pancreas.   We will see you back to talk about your pancreas studies.   Good to see you doing well.   Thank you.

## 2011-10-22 NOTE — Progress Notes (Signed)
I saw, examined, and discussed the patient with Dr Burtis Junes and agree with the note contained here.

## 2011-10-25 ENCOUNTER — Encounter: Payer: Self-pay | Admitting: Licensed Clinical Social Worker

## 2011-10-25 NOTE — Progress Notes (Signed)
Mr. Ekker in to [facility] today.  Pt in need of bus fare and pantry items.  Mr. Halas utilizing bus fares today as pt was able to go to [facility] and obtain coverage for his psych meds (Prozac and Abilify).  Pt states [facility] covering his psych meds and is picking them up today from the [facility] at [facility].  Mr. Butters has applied for Medicaid and is proactively working on his Soc. Sec. Disability.  Pt also in need of healthier foods from [facility] pantry, pt received items.

## 2011-10-31 ENCOUNTER — Ambulatory Visit (INDEPENDENT_AMBULATORY_CARE_PROVIDER_SITE_OTHER): Payer: Self-pay | Admitting: Internal Medicine

## 2011-10-31 ENCOUNTER — Encounter: Payer: Self-pay | Admitting: Internal Medicine

## 2011-10-31 ENCOUNTER — Encounter: Payer: Self-pay | Admitting: *Deleted

## 2011-10-31 VITALS — BP 116/84 | HR 80 | Ht 63.5 in | Wt 217.2 lb

## 2011-10-31 DIAGNOSIS — K861 Other chronic pancreatitis: Secondary | ICD-10-CM

## 2011-10-31 DIAGNOSIS — Z1211 Encounter for screening for malignant neoplasm of colon: Secondary | ICD-10-CM

## 2011-10-31 MED ORDER — PANCRELIPASE (LIP-PROT-AMYL) 36000-114000 UNITS PO CPEP: 1.0000 | ORAL_CAPSULE | Freq: Three times a day (TID) | ORAL | Status: DC

## 2011-10-31 NOTE — Progress Notes (Signed)
I tried calling Michael [name] to inform him that he received the percocet per the [facility] controlled substances database.  I was also planning to inform him that we have concerns about his ability to responsibly care for narcotic medications and therefore we were no longer going to prescribe them through our clinic.  Each of the three listed numbers were called and I was immediately forwarded to an unidentified voice mail box.  I therefore did not leave a message.  Please notify the patient of our decision if he were to call back.  Thank You.

## 2011-10-31 NOTE — Progress Notes (Signed)
Pt presents stating he lost his script for pain med a couple of days after receiving the script, he states he did not have the script filled, that he lost the paper script. He would like it replaced. Needs [employer]. Please advise

## 2011-10-31 NOTE — Patient Instructions (Addendum)
You have been given a separate informational sheet regarding your tobacco use, the importance of quitting and local resources to help you quit.  You have an appointment set up for November 6th at 10:00am with pre-visit to discuss the prep instructions for your colonoscopy.  We will provide you with a suprep kit to use.  Your colonoscopy appointment is set for November 13th at 11:00am with Dr. Stan Head.  We are giving you samples of Creon to use:  Take one capsule with each meal.  Thank you for choosing me and [facility].  [name], M.D., [facility]

## 2011-10-31 NOTE — Progress Notes (Signed)
Patient ID: MADEX SEALS, male   DOB: 17-Nov-1955, 56 y.o.   MRN: [id number]  The patient returns in followup after had an incomplete colonoscopy due to a poor prep. He says he took the MoviPrep fine.  He has chronic pancreatitis, I gave him pancreatic enzyme samples, 36,000 units of Creon to take with every meal and he says he is taking those. That seems to have improved his defecation pattern is less diarrhea.  He has signed a pain contract with his primary care physician and we'll get Percocet for pain control.  Otherwise things are stable, his diabetes is still not controlled though he is working on that. He continues to live with his sister.   Medications, allergies, past medical history, past surgical history, family history and social history are reviewed and updated in the EMR.   Assessment:  1. Chronic pancreatitis   2. Special screening for malignant neoplasms, colon - he did have a small adenomatous polyp removed at his previous colonoscopy but the prep was poor and the exam incomplete    Plan:  1. Schedule repeat colonoscopy. I will have him take a previous it a week or so before that. I want to take some prep. Hopefully with the pancreatic enzyme supplementation we'll change the composition of his stool allow for a better prep. 2. Please note that this man does not really have a pancreatic mass, MRI and EUS have showed some focal duct dilation but there is nothing suspicious. He does have chronic pancreatitis and will stay on the enzymes for the time being.  CC: Christen Bame, MD

## 2011-10-31 NOTE — Progress Notes (Signed)
Review of the [place] Controlled Substances database reveals that Mr. Pock did fill the prescription for #60 of percocet on 10/20/2011.  I am therefore concerned that he is either not being honest with [place] or does not have the necessary faculties for responsible use of controlled substances if this is an honest request.  I spoke with Dr. Burtis Junes and informed her of the situation.  She does not feel strongly about continuing the percocet.  I also spoke with Dr. Rogelia Boga who also does not feel strongly that the percocet be continued given the above.  We will therefore discontinue the percocet.  The [facility] will no longer provide narcotics through our clinic.

## 2011-11-06 ENCOUNTER — Telehealth: Payer: Self-pay | Admitting: Licensed Clinical Social Worker
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
## 2011-11-10 ENCOUNTER — Telehealth: Payer: Self-pay | Admitting: Licensed Clinical Social Worker

## 2011-11-10 NOTE — Telephone Encounter (Signed)
Mr. Enriques placed call to CSW this morning to discuss his concerns regarding his pancreatitis and diabetes. Pt also discussed his family situation.  CSW offered emotional support and encouragement. CSW will route this call to PCP to address pt's concerns during his scheduled [facility] appt.  Mr. Solivan is not fully trusting of his GI physician, but has developed a relationship with his PCP.  CSW encouraged Mr. Taussig to discuss his concerns during his next [facility] appt.  CSW unsure if pt will be able to clearly voice questions.  Pt's current concerns and questions:  1. What is a polyp? 2. Why is my pancreas damaged? Is it r/t to alcohol? 3. What is that mass on the tail end of my pancreas?  Cancer runs in my family. 4. Why are my sugars still high, is it r/t to my pancreas? 5. My insulin does not bring my sugar down enough? Pt also states he has not been sleeping well at night, pt has discussed this with his psychiatrist.  Mr. Swoveland keeps his scheduled [facility] appt's. Pt will need transportation assistance to the [facility] appt on 11/1, attempting to secure 31 day pass from inpatient.  Mr. Ruddy does have 3 appt schedule in the month of November.  Pt will notify CSW prior to appt regarding need.

## 2011-11-21 ENCOUNTER — Encounter: Payer: Self-pay | Admitting: Licensed Clinical Social Worker

## 2011-11-22 NOTE — Progress Notes (Signed)
[name] presents to [facility] office to see CSW for assistance with financial application from [employer] that is currently in collections.  CSW assisted [name] with application and faxed to [facility] [phone].  Pt has upcoming GI and [facility] appt's.  Provided [name] with 3 bus passes and 4 cans from pantry.

## 2011-11-24 ENCOUNTER — Ambulatory Visit (INDEPENDENT_AMBULATORY_CARE_PROVIDER_SITE_OTHER): Payer: Self-pay | Admitting: Internal Medicine

## 2011-11-24 ENCOUNTER — Other Ambulatory Visit: Payer: Self-pay | Admitting: Internal Medicine

## 2011-11-24 ENCOUNTER — Encounter: Payer: Self-pay | Admitting: Internal Medicine

## 2011-11-24 ENCOUNTER — Telehealth: Payer: Self-pay | Admitting: Licensed Clinical Social Worker

## 2011-11-24 ENCOUNTER — Telehealth: Payer: Self-pay | Admitting: Internal Medicine

## 2011-11-24 VITALS — BP 127/91 | HR 88 | Temp 98.4°F | Ht 63.0 in | Wt 220.3 lb

## 2011-11-24 DIAGNOSIS — K861 Other chronic pancreatitis: Secondary | ICD-10-CM

## 2011-11-24 DIAGNOSIS — E119 Type 2 diabetes mellitus without complications: Secondary | ICD-10-CM

## 2011-11-24 DIAGNOSIS — IMO0002 Reserved for concepts with insufficient information to code with codable children: Secondary | ICD-10-CM

## 2011-11-24 DIAGNOSIS — E1165 Type 2 diabetes mellitus with hyperglycemia: Secondary | ICD-10-CM

## 2011-11-24 MED ORDER — OXYCODONE-ACETAMINOPHEN 10-325 MG PO TABS: 1.0000 | ORAL_TABLET | Freq: Four times a day (QID) | ORAL | Status: DC | PRN

## 2011-11-24 NOTE — Telephone Encounter (Signed)
Patient came in the office and Dr. Leone Payor spoke with him regarding his medicines and he was given a written rx for Percocet.

## 2011-11-24 NOTE — Telephone Encounter (Signed)
Michael [name] is currently in a Controlled Medication Contract for pain.  Pt receives Percocet 10-325mg .  Pt states [facility] pharmacy no longer recognizes [employer].  Pt has been obtaining the Rx from [facility] for a cost of $37.  This cost is a financial strain on Michael [name].  CSW was able to obtain [employer]  Coupon for pt to use at [facility], cost will be $26.  CSW left pt this information and coupon at front desk.  Will notify pt on coupon, will need to update contract if he chooses to fill at [place], instead of [place].

## 2011-11-24 NOTE — Progress Notes (Signed)
Patient with chronic pancreatitis pain. Asking for refill on Percocet. Will do this - reasonable but will need to work out long-term pain medication prescribing. He violated his contract at IM clinic - told them he did not get Rx but did - he says was misunderstanding I explained we will have to work out long-term use contract in future and will check database

## 2011-11-24 NOTE — Patient Instructions (Addendum)
Please continue taking your diabetes medication of 1000 mg of metformin twice a day and 57 units of Lantus at bedtime we can followup on the higher blood sugars are doing in 3 months.  In terms of your pain the clinic is unable to give you the Percocet pills and feels that since her pain is due to your pancreas that you should followup with Dr. Leone Payor and that if he feels it is appropriate he will give you the pain medication.  Continue on your cooking for her family and eating healthy these are both great accomplishments.  If you have any questions please do not hesitate to call the clinic  Thank you.

## 2011-11-24 NOTE — Progress Notes (Signed)
Subjective:   Patient ID: Michael [name] male   DOB: Jun 02, 1955 56 y.o.   MRN: [id number]  HPI: [name] is a 56 y.o. African American man with a past medical history of diabetes type II uncontrolled and chronic alcoholic pancreatitis presents for followup on diabetes care and recent GI appointments. [facility] has recently joined the [facility] and is taking classes at this time. He continues to have positive support from his sister and nieces regarding the management of his illnesses. He continues to on her and followup with [facility] regarding his mental health that includes depression with anger and outbursts. He feels that his mood is relatively stable now that he is both on medication and receiving counseling provided by [facility]. He also follows up with Dr. Leone Payor in terms of his abdominal pain and is scheduled for a repeat colonoscopy on 12/06/11 since his previous exam was inadequate secondary to poor prep and an adenomatous polyp was removed at that time. In regards to his abdominal pain it is most likely thought to be chronic pancreatitis and he was recently started on enzyme replacement that has dramatically decreased his diarrhea and also helped in reducing his pain. The patient is angry that the [facility] will no longer be providing him with pain medication do to her violation of his contract at his last visit where he stated he was unable to fill the prescription and then per review of the [place] controlled substance database that prescription was actually filled on the day was written. The patient states today that he "found the prescription recently in his aunt's glove compartment and has had pain medication until 4-5 days ago." States that he has been able to sleep at night and is not having frequent pain episodes at this time. It was explained that he was receiving the pain medication for his chronic abdominal pain and therefore if Dr. Leone Payor who is managing that  thought it was appropriate that his office would supply him with that medication.    Past Medical History  Diagnosis Date  . Chronic pancreatitis   . Smoker   . Diabetes mellitus     diet controlled  . Gastritis due to alcohol without hemorrhage   . HTN (hypertension)   . Pancreatic lesion     appears innocent and related to chronic pancreatitis  . Heavy alcohol use     hx of  . Marijuana use     hx of  . Migraine   . Depression   . Helicobacter pylori gastritis 03/27/2011    on endoscopy, treated with omeprazole, clarithromycin and amoxicillin  . Insomnia   . ACE inhibitor-aggravated angioedema   . Sessile colonic polyp    Current Outpatient Prescriptions  Medication Sig Dispense Refill  . ARIPiprazole (ABILIFY) 10 MG tablet Take 10 mg by mouth daily.      . clotrimazole (LOTRIMIN) 1 % cream Apply topically 2 (two) times daily.  40 g  1  . FLUoxetine (PROZAC) 20 MG capsule Take 40 mg by mouth daily.       Marland Kitchen glucose blood (AGAMATRIX PRESTO TEST) test strip Use as instructed  100 each  12  . hydrochlorothiazide (HYDRODIURIL) 25 MG tablet Take 1 tablet (25 mg total) by mouth daily.  31 tablet  3  . hydrOXYzine (ATARAX/VISTARIL) 10 MG tablet Take 10 mg by mouth 3 (three) times daily as needed.      . insulin glargine (LANTUS SOLOSTAR) 100 UNIT/ML injection Inject 57 Units into  the skin at bedtime.  15 mL  12  . Insulin Pen Needle 31G X 5 MM MISC 1 each by Does not apply route daily.  100 each  11  . metFORMIN (GLUCOPHAGE) 500 MG tablet Take 2 tablets (1,000 mg total) by mouth 2 (two) times daily with a meal.  120 tablet  5  . omeprazole (PRILOSEC) 20 MG capsule Take 1 capsule (20 mg total) by mouth daily.  30 capsule  11  . oxyCODONE-acetaminophen (PERCOCET) 10-325 MG per tablet Take 1 tablet by mouth every 6 (six) hours as needed for pain.  60 tablet  0  . Pancrelipase, Lip-Prot-Amyl, (CREON) 36000 UNITS CPEP Take 1 capsule by mouth 3 (three) times daily with meals.      .  Pancrelipase, Lip-Prot-Amyl, (CREON) 36000 UNITS CPEP Take 1 capsule by mouth 3 (three) times daily with meals.  100 capsule  0   Family History  Problem Relation Age of Onset  . Colon cancer Neg Hx   . Stomach cancer Neg Hx   . Anesthesia problems Neg Hx   . Hypotension Neg Hx   . Pseudochol deficiency Neg Hx   . Malignant hyperthermia Neg Hx   . Hypertension Sister   . Diabetes Sister   . Heart disease Sister    History   Social History  . Marital Status: Single    Spouse Name: N/A    Number of Children: 0  . Years of Education: N/A   Occupational History  . 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 Smoking status: Current Every Day Smoker -- 0.1 packs/day    Types: Cigarettes  . Smokeless tobacco: Never Used   Comment: info given 03/14/2011  . Alcohol Use: No     sobriety since 2/13  . Drug Use: Yes    Special: Marijuana     Jan 2103 last use  . Sexually Active: Not on file   Other Topics Concern  . Not on file   Social History Narrative   Has been living at 3 different friend's homes since discharge 01/2011. Was living in the shelter prior to admission. Had previously lived with his niece and other family members but he has gotten kicked out each time.    Review of Systems: Otherwise negative besides that listed in history of present illness Objective:  Physical Exam: Filed Vitals:   11/24/11 1319  BP: 127/91  Pulse: 88  Temp: 98.4 F (36.9 C)  TempSrc: Oral  Height: 5\' 3"  (1.6 m)  Weight: 220 lb 4.8 oz (99.927 kg)  SpO2: 99%   General: NAD HEENT: PERRL, EOMI, no scleral icterus Cardiac: RRR, no rubs, murmurs or gallops Pulm: clear to auscultation bilaterally, moving normal volumes of air Abd: soft, nontender, nondistended, BS present Ext: warm and well perfused, no pedal edema Neuro: alert and oriented X3, cranial nerves II-XII grossly  intact  Assessment & Plan:  1. Chronic pancreatitis: Dr. Leone Payor his primary gastroenterologist is managing his chronic pancreatitis and started him on pancreatic enzyme replacement. The patient reports that he has had significant improvement in terms of decrease in diarrhea and abdominal pain. Per his last visit at [facility] it was felt that he does not have the mental capacity to be able to follow the pain contract that was initially signed on 10/20/11, and therefore the [facility] will  no longer be supplying him with opiates. -Followup with Dr. Marvell Fuller office in terms of pain management  2.Diabetes: Patient continues to make radical lifestyle changes and is eating healthier and exercising more. His sugars run in the 2- 400s. His last hemoglobin A1c was 9.2 on 9/13. -pt received flu shot today -continue metformin 1000 mg twice a day -Lantus 57 units each bedtime -Followup in 3 months for re-evaluation -Patient will be following up with his personal optometrist in terms of annual vision exam  The pt was seen and discussed with Dr. Rogelia Boga

## 2011-11-24 NOTE — Telephone Encounter (Signed)
Patient would now like to be contacted @ [phone]

## 2011-11-27 NOTE — Progress Notes (Signed)
I saw, examined, and discussed the patient with Dr Burtis Junes and agree with the note contained here. Pt was denied refill of oxycodone last month - violation of the contract which might have just been confusion but if pt easily confused then at risk of unintentional misuse of controlled substances. Dr Burtis Junes encouraged pt to contract GI office to obtain refill and we provided the pt with phone to do so. Review of chart indicates that pt did get script from GI.

## 2011-11-29 ENCOUNTER — Ambulatory Visit (AMBULATORY_SURGERY_CENTER): Payer: Self-pay | Admitting: *Deleted

## 2011-11-29 VITALS — Ht 63.5 in | Wt 216.5 lb

## 2011-11-29 DIAGNOSIS — Z1211 Encounter for screening for malignant neoplasm of colon: Secondary | ICD-10-CM

## 2011-11-29 MED ORDER — NA SULFATE-K SULFATE-MG SULF 17.5-3.13-1.6 GM/177ML PO SOLN: 1.0000 | Freq: Once | ORAL | Status: DC

## 2011-11-29 NOTE — Progress Notes (Signed)
Explained to patient how to prep for colonoscopy,i did stress the clear liquid diet the entire day before. He understands instructions. Suprep sample given to patient.

## 2011-12-05 ENCOUNTER — Encounter: Payer: Self-pay | Admitting: Licensed Clinical Social Worker

## 2011-12-05 NOTE — Progress Notes (Signed)
Michael [name] presents to inform CSW of appt set up with [facility] physician [facility].  Pt will have a $20 copay.  CSW informed Michael [name] to keep appt and not no-show as it would be difficulty to reschedule after a no-show.  Pt continues to complain on high blood sugar levels.  Encouraged Michael [name] to schedule appt to diabetes educator.

## 2011-12-06 ENCOUNTER — Ambulatory Visit (AMBULATORY_SURGERY_CENTER): Payer: Self-pay | Admitting: Internal Medicine

## 2011-12-06 ENCOUNTER — Encounter: Payer: Self-pay | Admitting: Internal Medicine

## 2011-12-06 VITALS — BP 138/78 | HR 82 | Temp 97.8°F | Resp 21 | Ht 63.5 in | Wt 216.0 lb

## 2011-12-06 DIAGNOSIS — D126 Benign neoplasm of colon, unspecified: Secondary | ICD-10-CM

## 2011-12-06 DIAGNOSIS — D1330 Benign neoplasm of unspecified part of small intestine: Secondary | ICD-10-CM

## 2011-12-06 DIAGNOSIS — Z1211 Encounter for screening for malignant neoplasm of colon: Secondary | ICD-10-CM

## 2011-12-06 LAB — GLUCOSE, CAPILLARY: Glucose-Capillary: 168 mg/dL — ABNORMAL HIGH (ref 70–99)

## 2011-12-06 MED ORDER — SODIUM CHLORIDE 0.9 % IV SOLN: 500.0000 mL | INTRAVENOUS | Status: DC

## 2011-12-06 NOTE — Progress Notes (Signed)
Propofol given over incremental dosages 

## 2011-12-06 NOTE — Patient Instructions (Addendum)
The prep was better but still not good enough. This could be due to your pancreas problems. I took a biopsy of a possible polyp and will let you know. You need to schedule an office visit with me for follow-up. Handouts on polyps, diverticulosis, high fiber diet [name], MD, FACG    YOU HAD AN ENDOSCOPIC PROCEDURE TODAY AT THE [facility]: Refer to the procedure report that was given to you for any specific questions about what was found during the examination.  If the procedure report does not answer your questions, please call your gastroenterologist to clarify.  If you requested that your care partner not be given the details of your procedure findings, then the procedure report has been included in a sealed envelope for you to review at your convenience later.  YOU SHOULD EXPECT: Some feelings of bloating in the abdomen. Passage of more gas than usual.  Walking can help get rid of the air that was put into your GI tract during the procedure and reduce the bloating. If you had a lower endoscopy (such as a colonoscopy or flexible sigmoidoscopy) you may notice spotting of blood in your stool or on the toilet paper. If you underwent a bowel prep for your procedure, then you may not have a normal bowel movement for a few days.  DIET: Your first meal following the procedure should be a light meal and then it is ok to progress to your normal diet.  A half-sandwich or bowl of soup is an example of a good first meal.  Heavy or fried foods are harder to digest and may make you feel nauseous or bloated.  Likewise meals heavy in dairy and vegetables can cause extra gas to form and this can also increase the bloating.  Drink plenty of fluids but you should avoid alcoholic beverages for 24 hours.  ACTIVITY: Your care partner should take you home directly after the procedure.  You should plan to take it easy, moving slowly for the rest of the day.  You can resume normal activity the day after  the procedure however you should NOT DRIVE or use heavy machinery for 24 hours (because of the sedation medicines used during the test).    SYMPTOMS TO REPORT IMMEDIATELY: A gastroenterologist can be reached at any hour.  During normal business hours, 8:30 AM to 5:00 PM Monday through Friday, call [phone].  After hours and on weekends, please call the GI answering service at [phone] who will take a message and have the physician on call contact you.   Following lower endoscopy (colonoscopy or flexible sigmoidoscopy):  Excessive amounts of blood in the stool  Significant tenderness or worsening of abdominal pains  Swelling of the abdomen that is new, acute  Fever of 100F or higher   FOLLOW UP: If any biopsies were taken you will be contacted by phone or by letter within the next 1-3 weeks.  Call your gastroenterologist if you have not heard about the biopsies in 3 weeks.  Our staff will call the home number listed on your records the next business day following your procedure to check on you and address any questions or concerns that you may have at that time regarding the information given to you following your procedure. This is a courtesy call and so if there is no answer at the home number and we have not heard from you through the emergency physician on call, we will assume that you have returned  to your regular daily activities without incident.  SIGNATURES/CONFIDENTIALITY: You and/or your care partner have signed paperwork which will be entered into your electronic medical record.  These signatures attest to the fact that that the information above on your After Visit Summary has been reviewed and is understood.  Full responsibility of the confidentiality of this discharge information lies with you and/or your care-partner.

## 2011-12-06 NOTE — Op Note (Signed)
Michael [name] [address] [place], [address]   COLONOSCOPY PROCEDURE REPORT  PATIENT: Michael [name], Michael [name]  MR#: [id number] BIRTHDATE: September 12, 1955 , 56  yrs. old GENDER: Male ENDOSCOPIST: [name], MD, [facility] PROCEDURE DATE:  12/06/2011 PROCEDURE:   Colonoscopy with biopsy ASA CLASS:   Class III INDICATIONS:average risk patient for colon cancer.   prior exam did show diminutive adenoma but prep was poor MEDICATIONS: propofol (Diprivan) 300mg  IV, MAC sedation, administered by CRNA, and These medications were titrated to patient response per physician's verbal order  DESCRIPTION OF PROCEDURE:   After the risks benefits and alternatives of the procedure were thoroughly explained, informed consent was obtained.  A digital rectal exam revealed no abnormalities of the rectum and A digital rectal exam revealed the prostate was not enlarged.   The LB [id number]  endoscope was introduced through the anus and advanced to the cecum, which was identified by both the appendix and ileocecal valve. No adverse events experienced.   The quality of the prep was Suprep poor  The instrument was then slowly withdrawn as the colon was fully examined.      COLON FINDINGS: A possible small smooth flat polyp was found at the ileocecal valve.  Multiple biopsies were performed using cold forceps.   Not clear it was a polyp and unclear if snare approach would work. Mild diverticulosis was noted The finding was in the right colon.   The colon mucosa was otherwise normal as best it could be seen with limited views due to poor prep.  Retroflexed views revealed no abnormalities. The time to cecum=2 minutes 38 seconds.  Withdrawal time=8 minutes 28 seconds.  The scope was withdrawn and the procedure completed. COMPLICATIONS: There were no complications.  ENDOSCOPIC IMPRESSION: 1.   ? Small flat polyp was found at the ileocecal valve; multiple biopsies were performed using cold  forceps 2.   Mild diverticulosis was noted in the right colon 3.   The colon mucosa was otherwise normal but poor prep - better than previous (now on Creon)  RECOMMENDATIONS: Call office for follow-up appointment so we can discuss follow-up options. May need obs admit to prep.   eSigned:  [name], MD, [name] 12/06/2011 1:01 PM   cc: The Patient    and Christen Bame, MD

## 2011-12-06 NOTE — Progress Notes (Signed)
Patient did not have preoperative order for IV antibiotic SSI prophylaxis. (G8918)  Patient did not experience any of the following events: a burn prior to discharge; a fall within the facility; wrong site/side/patient/procedure/implant event; or a hospital transfer or hospital admission upon discharge from the facility. (G8907)  

## 2011-12-07 ENCOUNTER — Telehealth: Payer: Self-pay

## 2011-12-07 NOTE — Telephone Encounter (Signed)
  Follow up Call-  Call back number 12/06/2011 09/12/2011  Post procedure Call Back phone  # [phone]  Permission to leave phone message Yes Yes     Patient questions:  Do you have a fever, pain , or abdominal swelling? no Pain Score  0 *  Have you tolerated food without any problems? yes  Have you been able to return to your normal activities? yes  Do you have any questions about your discharge instructions: Diet   no Medications  no Follow up visit  no  Do you have questions or concerns about your Care? no  Actions: * If pain score is 4 or above: No action needed, pain <4.

## 2011-12-13 ENCOUNTER — Encounter: Payer: Self-pay | Admitting: Internal Medicine

## 2011-12-13 NOTE — Progress Notes (Signed)
Quick Note:  Not a polyp ______

## 2011-12-15 ENCOUNTER — Other Ambulatory Visit: Payer: Self-pay | Admitting: Internal Medicine

## 2011-12-15 ENCOUNTER — Encounter: Payer: Self-pay | Admitting: Licensed Clinical Social Worker

## 2011-12-15 ENCOUNTER — Encounter: Payer: Self-pay | Admitting: Internal Medicine

## 2011-12-15 ENCOUNTER — Telehealth: Payer: Self-pay | Admitting: Licensed Clinical Social Worker

## 2011-12-15 DIAGNOSIS — K861 Other chronic pancreatitis: Secondary | ICD-10-CM

## 2011-12-15 MED ORDER — OXYCODONE-ACETAMINOPHEN 10-325 MG PO TABS: 1.0000 | ORAL_TABLET | Freq: Four times a day (QID) | ORAL | Status: DC | PRN

## 2011-12-15 NOTE — Telephone Encounter (Signed)
Patient informed that oxy rx will be up front for pick up today before 5 pm.   He has appointment to see Dr. Leone Payor 01/04/12.

## 2011-12-15 NOTE — Telephone Encounter (Signed)
Mr. Cinque has placed several calls this week to CSW.  Pt requesting letter to provide to his representative to assist with his Disability Claim.  Mr. Luckey also requesting 31 day bus pass from inpt CSW department.  However, pt has one appt scheduled for the month of December, in addition to his mental health appt's.  At this time CSW will provide pt with 4 bus passes.  As a 31 day, is an excessive request from inpatient at this time.  Pt also states his food stamps have been delayed this month.  Pt may need assistance from [facility] pantry if needed.  CSW will inform nursing staff that will be with Mr. Binion this afternoon.

## 2011-12-15 NOTE — Telephone Encounter (Signed)
Ok to refill as before Needs REV before next fill

## 2011-12-15 NOTE — Telephone Encounter (Signed)
Please advise and I will contact patient Sir, thank you.

## 2011-12-15 NOTE — Telephone Encounter (Signed)
Mr. Barcellona placed call to CSW stating he wasn't feeling to good today and tried to cancel his appt this afternoon with Dr. Burtis Junes.  Pt states he called into Dr. Marvell Fuller office to obtain refill on his pain medication but chart indicated pt should not be ready for refill.  Pt's pain medication is now prescribed by Dr. Marvell Fuller office, CSW referred pt back to Dr. Marvell Fuller office regarding concern.  Pt requesting CSW to utilize GoodRx for discounted coupon to obtain pain medication.  Mr. Glancy Rx is Percocet 10-325mg  which is not of the [employer].  CSW printed GoodRx coupon.  Pt inquiring about 31 day bus pass for transportation to/from [facility].  CSW informed Mr. Hornsby unsure if Inpt CSW department will supply for non-medical appt but CSW will inquire.  CSW provided Mr. Huneke with letter, 4 bus fares, and [employer] coupon.  CSW notified front office staff of pt's desire to cancel appt.

## 2011-12-25 ENCOUNTER — Telehealth: Payer: Self-pay | Admitting: Licensed Clinical Social Worker

## 2011-12-25 ENCOUNTER — Telehealth: Payer: Self-pay | Admitting: Internal Medicine

## 2011-12-25 ENCOUNTER — Telehealth: Payer: Self-pay | Admitting: Dietician

## 2011-12-25 ENCOUNTER — Ambulatory Visit: Payer: Self-pay | Admitting: Dietician

## 2011-12-25 NOTE — Telephone Encounter (Signed)
Patient called back cancelling appointment with CDE saying he has a family member who will help him buy the pen needles.  Suggest patient be taught how to use vial and syringe at next DSMT visit

## 2011-12-25 NOTE — Telephone Encounter (Signed)
Pt calling CSW to inquire about assistance programs for Lantus pen needles.  CSW referred Michael [name] to MAP office as they may.  Pt received 31 day pass from Inpt CSW unit.  Will pick up and leave for pt at [facility] front desk.

## 2011-12-25 NOTE — Telephone Encounter (Signed)
CSW returning call to Michael [name].  Michael [name] inquiring about availability of 31 day bus pass.  CSW informed Michael [name], CSW sent email to Director of CSW in-patient regarding availability.  Michael [name] aware CSW will inform once CSW is aware of pass availability.  Michael [name] states he has a dermatology appt on 12/9.  Michael [name] also canceling appt with CDE today at 2:30 pm.  Michael [name] states family member will purchase needles for him tomorrow at [facility] for $11.  CSW informed CDE.

## 2011-12-25 NOTE — Telephone Encounter (Signed)
Has not been taking his insulin last few days because he ran out of open needles. York Spaniel he could not afford the 12-14 $ to purchase them at [facility] and coupon was not accepted.   CDE offered alternative of vial and syringe insulin administration ( syringes are 6$ at [facility] and he can reuse safely) Patient scheduled to see CDE at 2:30 Pm today to learn how to draw insulin out of pen with syringe and how to use vial and syringe.

## 2011-12-25 NOTE — Telephone Encounter (Signed)
Questions answered about the letter.  He has multiple questions about future bx for pancreatitis.  He will keep the appt for 01/04/12

## 2012-01-01 ENCOUNTER — Telehealth: Payer: Self-pay | Admitting: Licensed Clinical Social Worker

## 2012-01-01 NOTE — Telephone Encounter (Signed)
Michael [name] placed call to CSW.  Michael [name] requesting 4 one way fare passes.  CSW informed Michael [name], CSW will need to assess transportation needs after his current 31 day pass has expired.  Michael [name] received a 31 day bus pass on 12/25/2011.  Pt will not be eligible for transportation assistance again until 01/25/2012.   Pt also inquiring about programs that deliver diabetic medication/supplies to those with Medicaid.  CSW informed Michael [name], will need to research information and return call.

## 2012-01-03 ENCOUNTER — Telehealth: Payer: Self-pay | Admitting: Internal Medicine

## 2012-01-03 NOTE — Telephone Encounter (Signed)
Pt's VM not accepting messages, I will try to call pt back later today.  Pt has appointment tomorrow.

## 2012-01-03 NOTE — Telephone Encounter (Signed)
Spoke to patient, he will discuss his needed refills tomorrow at his appointment.

## 2012-01-04 ENCOUNTER — Telehealth: Payer: Self-pay | Admitting: Licensed Clinical Social Worker

## 2012-01-04 ENCOUNTER — Ambulatory Visit (INDEPENDENT_AMBULATORY_CARE_PROVIDER_SITE_OTHER): Payer: Self-pay | Admitting: Internal Medicine

## 2012-01-04 ENCOUNTER — Encounter: Payer: Self-pay | Admitting: Licensed Clinical Social Worker

## 2012-01-04 ENCOUNTER — Encounter: Payer: Self-pay | Admitting: Internal Medicine

## 2012-01-04 VITALS — BP 138/62 | HR 74 | Ht 63.5 in | Wt 214.0 lb

## 2012-01-04 DIAGNOSIS — K861 Other chronic pancreatitis: Secondary | ICD-10-CM

## 2012-01-04 MED ORDER — OXYCODONE-ACETAMINOPHEN 10-325 MG PO TABS: 1.0000 | ORAL_TABLET | Freq: Four times a day (QID) | ORAL | Status: DC | PRN

## 2012-01-04 MED ORDER — PANCRELIPASE (LIP-PROT-AMYL) 36000-114000 UNITS PO CPEP: 1.0000 | ORAL_CAPSULE | Freq: Three times a day (TID) | ORAL | Status: DC

## 2012-01-04 NOTE — Patient Instructions (Addendum)
Today you have been given a written rx for Percocet 10/325mg   Today we are giving you samples of creon, let us know before you run out.   Follow up with [place] in 2 months to talk about doing another colonoscopy.  Thank you for choosing me and [facility].  [name], M.D., [facility]

## 2012-01-04 NOTE — Telephone Encounter (Signed)
Michael [name] called in to CSW stating he has an appointment with Dr. Leone Payor today and will be picking up a prescription for his pain medication.  Pt requesting CSW to print out GoodRx coupon to assist with payment.  Michael [name] has been informed this medication is covered by his [employer] Card at the [employer] for a cost of $6-$8.  CSW provided letter stating this along with GoodRx coupon.

## 2012-01-04 NOTE — Progress Notes (Signed)
  Subjective:    Patient ID: Michael [name], male    DOB: Dec 12, 1955, 56 y.o.   MRN: [id number]  HPI Patient returns for followup of his chronic pancreatitis. He is also yet to have a colonoscopy with an adequate prep after 2 attempts this year. He had a diminutive adenoma on one, a questionable polyp returned as normal small bowel biopsy on his ileocecal valve. He admits to occasional rectal bleeding. This is a slight blood on the toilet paper. In general his stools are okay without diarrhea, he says he is using a Creon regularly. He has about 9 Percocet (would like a refill for this month. 60 were dispensed almost a month ago. He has been doing some odd jobs, and is hoping to return to work eventually. He is going to pursue EGD. He reports he is abstinent from alcohol. He continues to live with his sister but is hoping to get public housing. He really has no source of significant income, though he says he is getting food stamps.  Still needs Percoect prn abdominal pain but thinks he is improving.  Medications, allergies, past medical history, past surgical history, family history and social history are reviewed and updated in the EMR.   Review of Systems As above    Objective:   Physical Exam WDWN NAD abd is soft and nontender     Assessment & Plan:   1. Chronic pancreatitis w/ chronic abdominal pain oxyCODONE-acetaminophen (PERCOCET) 10-325 MG per tablet   1. Continue enzyme supplements, Percocet #60 for chronic pain 2. Have advised I will not Rx narcotics if he gets Rx elsewhere - anticipate signing a contract 3. See me in 2 months and will see about repeating a colonoscopy in first part of 2014 - need to consider double prep or other measures though I think steatorrhea may have been partly to blame for poor preps.

## 2012-01-05 ENCOUNTER — Telehealth: Payer: Self-pay | Admitting: Licensed Clinical Social Worker

## 2012-01-05 ENCOUNTER — Telehealth: Payer: Self-pay | Admitting: *Deleted

## 2012-01-05 NOTE — Telephone Encounter (Signed)
i spoke w/ the pharmacy at [facility], they state that this oxycodone is not on the formulary and they referred him back to dr gessner's office. i will do some more checking

## 2012-01-05 NOTE — Telephone Encounter (Signed)
Michael [name] called in to CSW.  Pt attempting to have pain medication filled at [facility] pharmacy.  Pt's GI physician fills pain medications.  Pt states he took Rx and showed pharmacy his orange card and letter from CSW informing pt cost should be between $6-$8.  Michael [name] states [facility] pharmacy is charging him $74.  Pt did not pick up prescription and went home waiting to hear back from staff regarding orange card pain medication benefits.  CSW referred to RN to assist with confirming pain medication with [facility] pharmacy.

## 2012-01-09 ENCOUNTER — Telehealth: Payer: Self-pay | Admitting: Licensed Clinical Social Worker

## 2012-01-09 NOTE — Telephone Encounter (Signed)
Mr. Rochin placed call to CSW to inquire about cost of pain medication.  Pt requesting what pain medication [facility] covers with [facility].  CSW informed Mr. Madeira, RN still checking but current medication is not on formulary according to pharmacy.  Pt would like pain medication that is appropriate and on [facility] pharmacy formulary.  Pt discussed need to reapply for [facility] card in the near future.   CSW informed Mr. Simonin of new program that provides transportation to/from doctors appt with [facility] card and application.  Pt states he will come in 12/19 to complete application.

## 2012-01-11 ENCOUNTER — Telehealth: Payer: Self-pay | Admitting: Internal Medicine

## 2012-01-11 NOTE — Telephone Encounter (Signed)
Patient inquiring if it will be possible to get his Percocet rx on 02/02/12?  Thank you, I will call him back .

## 2012-01-11 NOTE — Telephone Encounter (Signed)
Spoke to [place] (sister) at the # he wanted [place] to call, she is going to relay the message to him that Dr. Leone Payor said that would be ok for [place] to do Percocet refill 02/02/12.

## 2012-01-12 ENCOUNTER — Encounter: Payer: Self-pay | Admitting: Licensed Clinical Social Worker

## 2012-01-12 ENCOUNTER — Encounter: Payer: Self-pay | Admitting: Internal Medicine

## 2012-01-12 ENCOUNTER — Ambulatory Visit (INDEPENDENT_AMBULATORY_CARE_PROVIDER_SITE_OTHER): Payer: No Typology Code available for payment source | Admitting: Internal Medicine

## 2012-01-12 VITALS — BP 120/80 | HR 86 | Temp 98.0°F | Ht 65.0 in | Wt 218.8 lb

## 2012-01-12 DIAGNOSIS — IMO0002 Reserved for concepts with insufficient information to code with codable children: Secondary | ICD-10-CM

## 2012-01-12 DIAGNOSIS — Z79899 Other long term (current) drug therapy: Secondary | ICD-10-CM

## 2012-01-12 DIAGNOSIS — E1165 Type 2 diabetes mellitus with hyperglycemia: Secondary | ICD-10-CM

## 2012-01-12 DIAGNOSIS — E119 Type 2 diabetes mellitus without complications: Secondary | ICD-10-CM

## 2012-01-12 LAB — GLUCOSE, CAPILLARY: Glucose-Capillary: 320 mg/dL — ABNORMAL HIGH (ref 70–99)

## 2012-01-12 MED ORDER — INSULIN GLARGINE 100 UNIT/ML ~~LOC~~ SOLN: SUBCUTANEOUS | Status: DC

## 2012-01-12 NOTE — Patient Instructions (Signed)
It was pleasure to see you today.   We have changed your Lantus to 65 Units at night. Continue taking the Metformin 1000mg  in the morning and night. We will follow up with you in 1 month to recheck your sugars.   If you feel sick and can't eat please call the clinic and we will help you with your insulin. It is important that you do this.

## 2012-01-12 NOTE — Progress Notes (Signed)
Subjective:   Patient ID: Michael [name] male   DOB: 1955-12-09 56 y.o.   MRN: [id number]  HPI: [name] is a 56 y.o. African American man with a past medical history of diabetes type II uncontrolled and chronic alcoholic pancreatitis presents for followup on diabetes care. Pt has been doing well and following up with Dr. Leone [name] his GI physician. He has had repeat colonoscopies with poor prep and will schedule something along with an EGD in January. Dr. Leone [name] will now be providing him with opiates for his chronic pancreatitis pain. He continues to follow dietary changes he's made an increase in his exercise. He denied any symptoms of hyper or hypoglycemia but still feels that his sugars could be under better control. He checks his sugars about 4 times a day and brings in his meter. He averages about 463 mg/dL with preprandial seen the 500s. This is actually significant improvement from his earlier presentation to the clinic and has had improvements hemoglobin A1c which was most recently 9.2 3 months ago.     Past Medical History  Diagnosis Date  . Chronic pancreatitis   . Smoker   . Gastritis due to alcohol without hemorrhage   . HTN (hypertension)   . Pancreatic lesion     appears innocent and related to chronic pancreatitis  . Heavy alcohol use     hx of  . Marijuana use     hx of  . Migraine   . Depression   . Helicobacter pylori gastritis 03/27/2011    on endoscopy, treated with omeprazole, clarithromycin and amoxicillin  . Insomnia   . ACE inhibitor-aggravated angioedema   . Sessile colonic polyp   . Diabetes mellitus    Current Outpatient Prescriptions  Medication Sig Dispense Refill  . ARIPiprazole (ABILIFY) 10 MG tablet Take 10 mg by mouth daily.      Marland Kitchen FLUoxetine (PROZAC) 20 MG capsule Take 40 mg by mouth daily.       Marland Kitchen glucose blood (AGAMATRIX PRESTO TEST) test strip Use as instructed  100 each  12  . hydrochlorothiazide (HYDRODIURIL) 25 MG tablet Take 1 tablet  (25 mg total) by mouth daily.  31 tablet  3  . hydrOXYzine (ATARAX/VISTARIL) 10 MG tablet Take 10 mg by mouth 3 (three) times daily as needed.      . insulin glargine (LANTUS SOLOSTAR) 100 UNIT/ML injection Inject 57 Units into the skin at bedtime.  15 mL  12  . Insulin Pen Needle 31G X 5 MM MISC 1 each by Does not apply route daily.  100 each  11  . metFORMIN (GLUCOPHAGE) 500 MG tablet Take 2 tablets (1,000 mg total) by mouth 2 (two) times daily with a meal.  120 tablet  5  . omeprazole (PRILOSEC) 20 MG capsule Take 1 capsule (20 mg total) by mouth daily.  30 capsule  11  . oxyCODONE-acetaminophen (PERCOCET) 10-325 MG per tablet Take 1 tablet by mouth every 6 (six) hours as needed for pain.  60 tablet  0  . Pancrelipase, Lip-Prot-Amyl, (CREON) 36000 UNITS CPEP Take 1 capsule by mouth 3 (three) times daily.  50 capsule  0   Family History  Problem Relation Age of Onset  . Colon cancer Neg Hx   . Stomach cancer Neg Hx   . Anesthesia problems Neg Hx   . Hypotension Neg Hx   . Pseudochol deficiency Neg Hx   . Malignant hyperthermia Neg Hx   . Hypertension Sister   . Diabetes  Sister   . Heart disease Sister    History   Social History  . Marital Status: Single    Spouse Name: N/A    Number of Children: 0  . Years of Education: N/A   Occupational History  . 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 Smoking status: Current Every Day Smoker -- 0.1 packs/day    Types: Cigarettes  . Smokeless tobacco: Never Used     Comment: info given 03/14/2011. Went up a couple  . Alcohol Use: No     Comment: sobriety since 2/13,was drinking 7-10 beer/daily  . Drug Use: Yes    Special: Marijuana     Comment: Feb. 2013 last use  . Sexually Active: None   Other Topics Concern  . None   Social History Narrative   Has been living at 3 different friend's homes since discharge 01/2011. Was  living in the shelter prior to admission. Had previously lived with his niece and other family members but he has gotten kicked out each time.    Review of Systems: Otherwise negative unless listed in history of present illness  Objective:  Physical Exam: Filed Vitals:   01/12/12 1323  BP: 120/80  Pulse: 86  Temp: 98 F (36.7 C)  TempSrc: Oral  Height: 5\' 5"  (1.651 m)  Weight: 218 lb 12.8 oz (99.247 kg)  SpO2: 98%   General: comfortable, NAD HEENT: PERRL, EOMI, no scleral icterus Cardiac: RRR, no rubs, murmurs or gallops Pulm: clear to auscultation bilaterally, moving normal volumes of air Abd: soft, nontender, nondistended, BS present Ext: warm and well perfused, no pedal edema Neuro: alert and oriented X3, cranial nerves II-XII grossly intact  Assessment & Plan:  1. DM 2: Last hemoglobin A1c in September 2013 was 9.2 and today >14.0. This maybe 2/2 to dietary changes. Glucometer readings still averaging in the 400s with preprandial skin the 600s. Secondary to the patient's ability to comprehend complex diabetes management it is difficult at this time to start preprandial insulin. Patient is compliant with 1000 mg metformin twice a day and 57 units Lantus each bedtime. Blood pressure is under good control at goal, 120/80 today, LDL 61 (1/13) and also at goal.  -increase Lantus 65U qhs -continue Metformin 1000mg  BID -Pt would benefit from podiatry referral- consider discussion at f/u appt -education materials given to pt regarding DM, HTN, and smoking -may benefit from DM educator visit to also discuss at next visit in light of worsening HgbA1c -pt was unable to provide sample Urine microalbumin should be ordered at next visit as well -f/u in 1 month for DM recheck  Pt was discussed with Dr. Rogelia Boga

## 2012-01-12 NOTE — Addendum Note (Signed)
Addended by: Neomia Dear on: 01/12/2012 05:55 PM   Modules accepted: Orders

## 2012-01-22 ENCOUNTER — Telehealth: Payer: Self-pay | Admitting: Internal Medicine

## 2012-01-22 NOTE — Telephone Encounter (Signed)
Spoke to Michael [name] his sister, she thinks he wanted to know about his Percocet refill.  I told her that Dr. Stan Head said it can be done 02/02/12.  She will pass this information on and he will call back with further questions.

## 2012-01-26 ENCOUNTER — Ambulatory Visit: Payer: Self-pay

## 2012-01-29 ENCOUNTER — Ambulatory Visit: Payer: Self-pay

## 2012-01-30 ENCOUNTER — Encounter: Payer: Self-pay | Admitting: Licensed Clinical Social Worker

## 2012-01-30 NOTE — Progress Notes (Signed)
Michael [name] presents today to see CSW.  Pt provided letter from sister for housing application.  CSW made copy and provided original back to Michael [name] to use for his [facility].  Pt aware letter will need to be notarized for [facility].  Michael [name] states he will schedule appt for financial counselor for reverification.  CSW informed Michael [name], CSW will contact Supportive Housing Program to discuss application and wait list.  Pt requesting financial assistance to help pay for narcotic pain medication which pt receives from Dr. Leone Payor.  Pt states he has attempted on several occasions to use [employer] at [employer] but cost is around $70.  CSW printed GoodRx coupon, pt states he uses it at [employer].

## 2012-02-01 ENCOUNTER — Telehealth: Payer: Self-pay | Admitting: Licensed Clinical Social Worker

## 2012-02-01 NOTE — Telephone Encounter (Signed)
Application faxed to the Supportive Housing Program.

## 2012-02-02 ENCOUNTER — Other Ambulatory Visit: Payer: Self-pay

## 2012-02-02 DIAGNOSIS — K861 Other chronic pancreatitis: Secondary | ICD-10-CM

## 2012-02-02 MED ORDER — OXYCODONE-ACETAMINOPHEN 10-325 MG PO TABS: 1.0000 | ORAL_TABLET | Freq: Four times a day (QID) | ORAL | Status: DC | PRN

## 2012-02-02 NOTE — Telephone Encounter (Signed)
Patient stopped in to pick up rx for Percocet ok'ed for this date previous by Dr. Leone Payor.  He waited while we did it and made a follow up appointment for early Feb. to see Dr. Leone Payor.

## 2012-02-16 ENCOUNTER — Telehealth: Payer: Self-pay | Admitting: Licensed Clinical Social Worker

## 2012-02-16 ENCOUNTER — Ambulatory Visit (INDEPENDENT_AMBULATORY_CARE_PROVIDER_SITE_OTHER): Payer: Self-pay | Admitting: Internal Medicine

## 2012-02-16 ENCOUNTER — Encounter: Payer: Self-pay | Admitting: Internal Medicine

## 2012-02-16 VITALS — BP 119/80 | HR 93 | Temp 97.7°F | Ht 63.0 in | Wt 216.3 lb

## 2012-02-16 DIAGNOSIS — E1165 Type 2 diabetes mellitus with hyperglycemia: Secondary | ICD-10-CM

## 2012-02-16 DIAGNOSIS — IMO0002 Reserved for concepts with insufficient information to code with codable children: Secondary | ICD-10-CM

## 2012-02-16 DIAGNOSIS — E119 Type 2 diabetes mellitus without complications: Secondary | ICD-10-CM

## 2012-02-16 LAB — GLUCOSE, CAPILLARY: Glucose-Capillary: 352 mg/dL — ABNORMAL HIGH (ref 70–99)

## 2012-02-16 MED ORDER — GLIPIZIDE 5 MG PO TABS: 5.0000 mg | ORAL_TABLET | Freq: Two times a day (BID) | ORAL | Status: DC

## 2012-02-16 NOTE — Progress Notes (Signed)
Subjective:   Patient ID: Michael [name] male   DOB: 1955-06-08 57 y.o.   MRN: [id number]  HPI: [name] is a 57 y.o. man pmh of uncontrolled DM, chronic pancreatitis, and HTN presents for f/u of his DM management. Patient has been compliant with his medication and recently had an increase in his Abilify her [place]. Since that time he's noticed some upper extremity left-sided shoulder pain that started around the same time. He does her membrane trauma to the site does not have any limited range of motion and has no sharp shooting pain radiating up or down that arm. He has not noticed any hypoglycemic readings and per review of his glucometer that he brings in today his total averages about 456. Early morning glucoses average at about 400. He has been compliant with the metformin 1000 mg twice a day and Lantus 65 units each bedtime. Patient has had some current financial difficulties and therefore he's relying on the food shelf for food and is waiting on [employer] notarized some final paperwork to be able to be eligible for the orange card that he was denied Medicaid. Otherwise the pt is doing well and scheduled to have his mass by his left eye removed and repeat colonoscopy this month.   Past Medical History  Diagnosis Date  . Chronic pancreatitis   . Smoker   . Gastritis due to alcohol without hemorrhage   . HTN (hypertension)   . Pancreatic lesion     appears innocent and related to chronic pancreatitis  . Heavy alcohol use     hx of  . Marijuana use     hx of  . Migraine   . Depression   . Helicobacter pylori gastritis 03/27/2011    on endoscopy, treated with omeprazole, clarithromycin and amoxicillin  . Insomnia   . ACE inhibitor-aggravated angioedema   . Sessile colonic polyp   . Diabetes mellitus    Current Outpatient Prescriptions  Medication Sig Dispense Refill  . ARIPiprazole (ABILIFY) 10 MG tablet Take 10 mg by mouth daily.      Marland Kitchen FLUoxetine (PROZAC) 20 MG capsule Take  40 mg by mouth daily.       Marland Kitchen glucose blood (AGAMATRIX PRESTO TEST) test strip Use as instructed  100 each  12  . hydrochlorothiazide (HYDRODIURIL) 25 MG tablet Take 1 tablet (25 mg total) by mouth daily.  31 tablet  3  . hydrOXYzine (ATARAX/VISTARIL) 10 MG tablet Take 10 mg by mouth 3 (three) times daily as needed.      . insulin glargine (LANTUS) 100 UNIT/ML injection Please take 65 units at bedtime  10 mL  3  . Insulin Pen Needle 31G X 5 MM MISC 1 each by Does not apply route daily.  100 each  11  . metFORMIN (GLUCOPHAGE) 500 MG tablet Take 2 tablets (1,000 mg total) by mouth 2 (two) times daily with a meal.  120 tablet  5  . omeprazole (PRILOSEC) 20 MG capsule Take 1 capsule (20 mg total) by mouth daily.  30 capsule  11  . oxyCODONE-acetaminophen (PERCOCET) 10-325 MG per tablet Take 1 tablet by mouth every 6 (six) hours as needed for pain.  60 tablet  0  . Pancrelipase, Lip-Prot-Amyl, (CREON) 36000 UNITS CPEP Take 1 capsule by mouth 3 (three) times daily.  50 capsule  0   Family History  Problem Relation Age of Onset  . Colon cancer Neg Hx   . Stomach cancer Neg Hx   .  Anesthesia problems Neg Hx   . Hypotension Neg Hx   . Pseudochol deficiency Neg Hx   . Malignant hyperthermia Neg Hx   . Hypertension Sister   . Diabetes Sister   . Heart disease Sister    History   Social History  . Marital Status: Single    Spouse Name: N/A    Number of Children: 0  . Years of Education: N/A   Occupational History  . 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 Smoking status: Current Every Day Smoker -- 0.1 packs/day    Types: Cigarettes  . Smokeless tobacco: Never Used     Comment: info given 03/14/2011. Went up a couple  . Alcohol Use: No     Comment: sobriety since 2/13,was drinking 7-10 beer/daily  . Drug Use: Yes    Special: Marijuana     Comment: Feb. 2013 last use  .  Sexually Active: None   Other Topics Concern  . None   Social History Narrative   Has been living at 3 different friend's homes since discharge 01/2011. Was living in the shelter prior to admission. Had previously lived with his niece and other family members but he has gotten kicked out each time.    Review of Systems: otherwise negative unless listed in HPI  Objective:  Physical Exam: Filed Vitals:   02/16/12 1319  BP: 119/80  Pulse: 93  Temp: 97.7 F (36.5 C)  TempSrc: Oral  Height: 5\' 3"  (1.6 m)  Weight: 216 lb 4.8 oz (98.113 kg)  SpO2: 99%   General: resting in bed HEENT: PERRL, EOMI, no scleral icterus Cardiac: RRR, no rubs, murmurs or gallops Pulm: clear to auscultation bilaterally, moving normal volumes of air Abd: soft, nontender, nondistended, BS present Ext: warm and well perfused, no pedal edema Neuro: alert and oriented X3, cranial nerves II-XII grossly intact  Assessment & Plan:  1. DM: Take his life include an A1c was 12/13 and greater than 14 only CBG was done this visit with a reading of 400. Per review of his glucometer readings preprandial are averaging 500s despite patient's compliance with metformin and Lantus. Due to patient's education and comprehension of disease management discussion in starting supplemental insulin such as a 75/25 was had and patient did not feel comfortable and therefore insisted on only been able to handle may be an oral agent. -glipizide 5mg  BID added -cont metformin 1000mg  BID -cont lantus 65U -pt would f/u when finished with colonoscopy nxt month  Pt discussed with Dr. Criselda Peaches

## 2012-02-16 NOTE — Telephone Encounter (Signed)
Pt placed call to CSW requesting 2 bus fare passes.  CSW inquired about [facility] transportation.  Pt states he had not heard from program.  CSW sent email to [facility], [facility], [facility].  Pt are registered once CSW has faxed application.  All pt's need to do is call to book transportation.  CSW left information for Mr. Alomar.  CSW provided the 2 bus fares today to assist with reading program/GI appt.  Pt should call to set up transportation to his GI appt which is affiliated with [facility].  Pt can use bus fare if transportation to GI physician is denied due to not being PCP or [facility].  Pt also inquiring about food pantry items.  CSW provided Mr. Petz with 2 cans of soup.  All items left with nurse, pt aware CSW will not be in office during his appt time.

## 2012-02-16 NOTE — Patient Instructions (Addendum)
It was nice to see you today.   We are still working on your diabetes. We added a new medicine called glipizide 5mg  that we would like you to take 2 times a day like your metformin it is very importatnt that you eat when you take this medicine. Continue to check your blood sugars and we will follow up when you can.   Best of luck with removal of your skin lesion and colonoscopy.

## 2012-02-26 ENCOUNTER — Ambulatory Visit: Payer: Self-pay

## 2012-02-27 ENCOUNTER — Telehealth: Payer: Self-pay | Admitting: Internal Medicine

## 2012-02-28 ENCOUNTER — Telehealth: Payer: Self-pay | Admitting: Licensed Clinical Social Worker

## 2012-02-28 NOTE — Telephone Encounter (Signed)
Pt attempted to utilize [facility] transportation for [facility] appt on 2/12, however, transportation is only to [facility] only.  Pt will need to call back 2/14 to schedule 2/28 [facility] appt transportation.  Pt in need of transportation to GI appt on 2/12.  CSW will provide pt with 2 one day fare passes for GI appt.  Pt to obtain 30 day pass from inpt SW dept.  Pt can use that for Dermatology appt.

## 2012-02-28 NOTE — Telephone Encounter (Signed)
Left Torez message to call me back.

## 2012-03-01 NOTE — Telephone Encounter (Signed)
Spoke to patient he wants to discuss future colonoscopy for the spring when its warmer.  Also needs medicine refilled at his visit.  Advised him that Dr. Leone Payor will discuss all these things at his visit 03/06/12.

## 2012-03-01 NOTE — Telephone Encounter (Signed)
Patient returned your call.

## 2012-03-06 ENCOUNTER — Encounter: Payer: Self-pay | Admitting: Internal Medicine

## 2012-03-06 ENCOUNTER — Ambulatory Visit (INDEPENDENT_AMBULATORY_CARE_PROVIDER_SITE_OTHER): Payer: Self-pay | Admitting: Internal Medicine

## 2012-03-06 VITALS — BP 132/88 | HR 80 | Ht 63.0 in | Wt 219.6 lb

## 2012-03-06 DIAGNOSIS — K861 Other chronic pancreatitis: Secondary | ICD-10-CM

## 2012-03-06 MED ORDER — OXYCODONE-ACETAMINOPHEN 10-325 MG PO TABS: 1.0000 | ORAL_TABLET | Freq: Four times a day (QID) | ORAL | Status: DC | PRN

## 2012-03-06 NOTE — Progress Notes (Signed)
  Subjective:    Patient ID: Michael [name], male    DOB: 10/24/1955, 57 y.o.   MRN: [id number]  HPI Roberts presents for follow-up of chronic pancreatitis. He says things are stable and he needs a refill of his oxycodone. He says his illnesses prevent him from working and he is pursuing disability. He would like a letter of support. Still needs to complete a colonoscopy with a good prep. Is hoping to get independent living quarters, still with sister.  Medications, allergies, past medical history, past surgical history, family history and social history are reviewed and updated in the EMR.   Review of Systems To have left facial/temporal lesion removed soon    Objective:   Physical Exam NAD    Assessment & Plan:   1. Chronic pancreatitis    2.  Hx of colon polyps, incomplete colonoscopy    1. Refill oxycodone - he is again advised that if he obains narcotics from other providers I will not continue to rx 2. I believe he is unable to maintain a job and work due to medical problems - I will write a letter of support to [employer]. 3. REV 1 month and will plan repeat colonoscopy then - he may need obs admit for prep

## 2012-03-06 NOTE — Patient Instructions (Addendum)
Today you have been given a written rx for oxycodone.  Dr. Leone Payor will write a letter for you to aid in getting disability.  Follow up with [place] in a month, at that time we will discuss the colonoscopy procedure.  Thank you for choosing me and [facility].  [name], M.D., [name]

## 2012-03-12 ENCOUNTER — Telehealth: Payer: Self-pay

## 2012-03-12 NOTE — Telephone Encounter (Signed)
Patient called to see if the letter he needed was ready as discussed at his last visit.  It was printed x 2 (one for him, one for his lawyer).  It was put up front for pick up as requested.  Confirmed his next appointment for March 18th and he will contact us when close to needing Percocet rx  At which time we will ask Dr. Stan Head to advise.

## 2012-03-13 ENCOUNTER — Ambulatory Visit: Payer: Self-pay

## 2012-03-13 ENCOUNTER — Encounter: Payer: Self-pay | Admitting: Licensed Clinical Social Worker

## 2012-03-14 NOTE — Progress Notes (Signed)
Patient ID: Michael [name], male   DOB: 11/20/1955, 57 y.o.   MRN: [id number] Pt notified he is currently on the wait list for the Supportive Housing Program through [place].  CSW received return call, pt's application received and accepted.

## 2012-03-21 ENCOUNTER — Other Ambulatory Visit: Payer: Self-pay | Admitting: *Deleted

## 2012-03-21 DIAGNOSIS — I10 Essential (primary) hypertension: Secondary | ICD-10-CM

## 2012-03-21 NOTE — Telephone Encounter (Signed)
refill 

## 2012-03-22 ENCOUNTER — Encounter: Payer: Self-pay | Admitting: Internal Medicine

## 2012-03-22 ENCOUNTER — Ambulatory Visit (INDEPENDENT_AMBULATORY_CARE_PROVIDER_SITE_OTHER): Payer: Self-pay | Admitting: Internal Medicine

## 2012-03-22 VITALS — BP 163/88 | HR 99 | Temp 98.2°F | Ht 63.0 in | Wt 218.2 lb

## 2012-03-22 DIAGNOSIS — Z79899 Other long term (current) drug therapy: Secondary | ICD-10-CM

## 2012-03-22 DIAGNOSIS — E119 Type 2 diabetes mellitus without complications: Secondary | ICD-10-CM

## 2012-03-22 DIAGNOSIS — IMO0002 Reserved for concepts with insufficient information to code with codable children: Secondary | ICD-10-CM

## 2012-03-22 DIAGNOSIS — I10 Essential (primary) hypertension: Secondary | ICD-10-CM

## 2012-03-22 LAB — GLUCOSE, CAPILLARY: Glucose-Capillary: 322 mg/dL — ABNORMAL HIGH (ref 70–99)

## 2012-03-22 LAB — POCT GLYCOSYLATED HEMOGLOBIN (HGB A1C): Hemoglobin A1C: >14

## 2012-03-22 NOTE — Patient Instructions (Signed)
We have made some changes to your diabetes medication:  -We have increased her metformin from 1000 mg daily to 1000 mg twice a day (you currently have the 500 mg with use of therefore take 2 tablets in the morning and 2 tablets at night and: -We have added another medicine called glipizide 5 mg and you need to take that twice a day 1 tablet in the morning and 1 tablet at night -Continue to take the Lantus 65 units at night -Will followup with you in about one month to see how you're doing -I Think it is very important that you either get a pillbox that someone can help you put together or we will work on getting you at home health aide or nurse to help set that up  Nice to see you and don't hesitate to call with questions: [phone]

## 2012-03-22 NOTE — Progress Notes (Signed)
Subjective:   Patient ID: Michael [name] male   DOB: 02-25-1955 57 y.o.   MRN: [id number]  HPI: [name] is a 57 y.o. made with a past medical history of hypertension, chronic pancreatitis, and poorly controlled diabetes here for a recheck on his diabetes. Patient has had current exacerbation of his underlying psychiatric issues that have warranted increase in some of his antipsychotic medication. Patient has also been approved for independent housing and possibly disability/Medicaid insurance. Patient states that he was increasing the agitated today as he had to wait for his transport and felt that he was not being she does correctly and became overly angry. Patient has underlying explosive anger is associated with his psychiatric disorder. Patient reports that he has an appointment next month for a removal of cysts/mass on his left on superior orbital fissure, along with a repeated colonoscopy secondary to some poor prep and an adenomatous polyps previously found on a screening colonoscopy. He is managed by Dr. Leone Payor for his chronic pancreatitis and these repeat colonoscopies. Patient brings in his pill bottles today and per review with patient appears that patient is being noncompliant with new medication changes in regards to his diabetes. Patient only taking 1000 mg of metformin daily and 67 units of Lantus he did not fill the prescription for glipizide at our last visit. Patient has been experiencing some polydipsia and polyuria throughout the day that has gotten worse over this month. Patient has also been having some increasing dietary indiscretions eating more fats and carbs, the patient has now been able to establish with a food shelf to help him have better options. Per chart review patient had been originally doing better when correct management of his medications was being done and helped by his nieces who live with him. Otherwise patient states that his mood seems to be a little bit  more stable he's not homicidal or suicidal at this time. Patient is optimistic as he has his disability hearing in 2 weeks. Pt continues to smoke but is not using alcohol.    Past Medical History  Diagnosis Date  . Chronic pancreatitis   . Smoker   . Gastritis due to alcohol without hemorrhage   . HTN (hypertension)   . Pancreatic lesion     appears innocent and related to chronic pancreatitis  . Heavy alcohol use     hx of  . Marijuana use     hx of  . Migraine   . Depression   . Helicobacter pylori gastritis 03/27/2011    on endoscopy, treated with omeprazole, clarithromycin and amoxicillin  . Insomnia   . ACE inhibitor-aggravated angioedema   . Sessile colonic polyp   . Diabetes mellitus    Current Outpatient Prescriptions  Medication Sig Dispense Refill  . ARIPiprazole (ABILIFY) 10 MG tablet Take 10 mg by mouth daily.      Marland Kitchen FLUoxetine (PROZAC) 20 MG capsule Take 40 mg by mouth daily.       Marland Kitchen glipiZIDE (GLUCOTROL) 5 MG tablet Take 1 tablet (5 mg total) by mouth 2 (two) times daily before a meal.  60 tablet  11  . glucose blood (AGAMATRIX PRESTO TEST) test strip Use as instructed  100 each  12  . hydrochlorothiazide (HYDRODIURIL) 25 MG tablet Take 1 tablet (25 mg total) by mouth daily.  31 tablet  3  . hydrOXYzine (ATARAX/VISTARIL) 10 MG tablet Take 10 mg by mouth 3 (three) times daily as needed.      . insulin  glargine (LANTUS) 100 UNIT/ML injection Please take 65 units at bedtime  10 mL  3  . Insulin Pen Needle 31G X 5 MM MISC 1 each by Does not apply route daily.  100 each  11  . metFORMIN (GLUCOPHAGE) 500 MG tablet Take 2 tablets (1,000 mg total) by mouth 2 (two) times daily with a meal.  120 tablet  5  . omeprazole (PRILOSEC) 20 MG capsule Take 1 capsule (20 mg total) by mouth daily.  30 capsule  11  . oxyCODONE-acetaminophen (PERCOCET) 10-325 MG per tablet Take 1 tablet by mouth every 6 (six) hours as needed for pain.  60 tablet  0  . Pancrelipase, Lip-Prot-Amyl, (CREON)  36000 UNITS CPEP Take 1 capsule by mouth 3 (three) times daily.  50 capsule  0   No current facility-administered medications for this visit.   Family History  Problem Relation Age of Onset  . Colon cancer Neg Hx   . Stomach cancer Neg Hx   . Anesthesia problems Neg Hx   . Hypotension Neg Hx   . Pseudochol deficiency Neg Hx   . Malignant hyperthermia Neg Hx   . Hypertension Sister   . Diabetes Sister   . Heart disease Sister    History   Social History  . Marital Status: Single    Spouse Name: N/A    Number of Children: 0  . Years of Education: N/A   Occupational History  . 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 Smoking status: Current Every Day Smoker -- 0.10 packs/day    Types: Cigarettes  . Smokeless tobacco: Never Used     Comment: info given 03/14/2011. Went up a couple  . Alcohol Use: No     Comment: sobriety since 2/13,was drinking 7-10 beer/daily  . Drug Use: Yes    Special: Marijuana     Comment: Feb. 2013 last use  . Sexually Active: None   Other Topics Concern  . None   Social History Narrative   Has been living at 3 different friend's homes since discharge 01/2011. Was living in the shelter prior to admission. Had previously lived with his niece and other family members but he has gotten kicked out each time.    Review of Systems: otherwise negative unless listed in HPI  Objective:  Physical Exam: Filed Vitals:   03/22/12 1330  BP: 163/88  Pulse: 99  Temp: 98.2 F (36.8 C)  TempSrc: Oral  Height: 5\' 3"  (1.6 m)  Weight: 218 lb 3.2 oz (98.975 kg)  SpO2: 99%   General: resting in bed HEENT: PERRL, EOMI, no scleral icterus Cardiac: RRR, no rubs, murmurs or gallops Pulm: clear to auscultation bilaterally, moving normal volumes of air Abd: soft, nontender, nondistended, BS present Ext: warm and well perfused, no pedal edema Neuro: alert  and oriented X3, cranial nerves II-XII grossly intact  Assessment & Plan:  1. DM: Pt last HgbA1c >14 on 12/13 and this was rechecked today >14. Pt brought in medication and appears to only be taking 500mg  metformin BID instead of the 1000mg  previously prescribed. Pt didn't fill glypizide stating that "couldn't find a pharmacy that had it." Pt is having sever difficulty managing medications.  - home RN to set up pill box (will worry when pt moves out on his own) -written instructions for medications given -  glipizide 5mg  BID, increased metformin 1000mg  BID, and continue lantus 65U qhs -f/u in 1 month for medication compliance and management -once pt has insurance [facility] will be important to pursue -pt given education material on DM  2. HTN: pt was nervous today and improved on recheck, pts BP hx has been well controlled. This maybe medication non-compliance as well. -cont HCTZ 25mg  daily  -supplemental materials regarding HTN and log were given  Pt discussed with Dr. Aundria Rud

## 2012-03-27 MED ORDER — HYDROCHLOROTHIAZIDE 25 MG PO TABS: 25.0000 mg | ORAL_TABLET | Freq: Every day | ORAL | Status: DC

## 2012-04-02 ENCOUNTER — Telehealth: Payer: Self-pay | Admitting: Internal Medicine

## 2012-04-02 DIAGNOSIS — K861 Other chronic pancreatitis: Secondary | ICD-10-CM

## 2012-04-02 MED ORDER — PANCRELIPASE (LIP-PROT-AMYL) 36000-114000 UNITS PO CPEP: 1.0000 | ORAL_CAPSULE | Freq: Three times a day (TID) | ORAL | Status: DC

## 2012-04-02 MED ORDER — OXYCODONE-ACETAMINOPHEN 10-325 MG PO TABS: 1.0000 | ORAL_TABLET | Freq: Four times a day (QID) | ORAL | Status: DC | PRN

## 2012-04-02 NOTE — Telephone Encounter (Signed)
p 

## 2012-04-03 NOTE — Telephone Encounter (Signed)
Patient informed that Percocet rx and creon samples up front for pick up.

## 2012-04-09 ENCOUNTER — Telehealth: Payer: Self-pay | Admitting: Licensed Clinical Social Worker

## 2012-04-09 ENCOUNTER — Ambulatory Visit: Payer: Self-pay | Admitting: Internal Medicine

## 2012-04-09 NOTE — Telephone Encounter (Signed)
CSW returned call to Mr. Deady regarding transportation needs.  CSW discussed [facility] bus pass are not for general usage only to assist for last resort to transport pt to/from a physician appt.  Pt aware this CSW does not get 31 day passes. CSW informed Mr. Catherman this CSW will only have passes to assist with medical appt's outside of [facility].

## 2012-04-12 NOTE — Telephone Encounter (Signed)
Pt has appt's for Dr. Leone Payor on 4/11 and appt's with [facility] and group.  Pt states he needs to obtain medication from [facility].  CSW left 3 all day bus passes for Michael [name], sample lotions-per pt request, and toiletry items.  Pt notified no more bus passes will be provided until May 2014.

## 2012-04-22 ENCOUNTER — Telehealth: Payer: Self-pay | Admitting: Internal Medicine

## 2012-04-22 NOTE — Telephone Encounter (Signed)
Saksham called to let us know that his disability wasn't approved.  He ask if there was other papers we could send in.  I told him to ask his lawyer since they know what type of information is needed with these type cases.  He has an appointment with Dr. Leone Payor 4/11 and will relay any updates to [place] at his visit.

## 2012-05-03 ENCOUNTER — Encounter: Payer: Self-pay | Admitting: Internal Medicine

## 2012-05-03 ENCOUNTER — Telehealth: Payer: Self-pay | Admitting: Licensed Clinical Social Worker

## 2012-05-03 ENCOUNTER — Ambulatory Visit (INDEPENDENT_AMBULATORY_CARE_PROVIDER_SITE_OTHER): Payer: Self-pay | Admitting: Internal Medicine

## 2012-05-03 VITALS — BP 132/88 | HR 95 | Ht 63.0 in | Wt 217.0 lb

## 2012-05-03 DIAGNOSIS — K861 Other chronic pancreatitis: Secondary | ICD-10-CM

## 2012-05-03 DIAGNOSIS — R109 Unspecified abdominal pain: Secondary | ICD-10-CM

## 2012-05-03 DIAGNOSIS — G8929 Other chronic pain: Secondary | ICD-10-CM

## 2012-05-03 DIAGNOSIS — Z1211 Encounter for screening for malignant neoplasm of colon: Secondary | ICD-10-CM

## 2012-05-03 DIAGNOSIS — Z86010 Personal history of colonic polyps: Secondary | ICD-10-CM

## 2012-05-03 MED ORDER — OXYCODONE-ACETAMINOPHEN 10-325 MG PO TABS: 1.0000 | ORAL_TABLET | Freq: Four times a day (QID) | ORAL | Status: DC | PRN

## 2012-05-03 MED ORDER — PANCRELIPASE (LIP-PROT-AMYL) 36000-114000 UNITS PO CPEP: 1.0000 | ORAL_CAPSULE | Freq: Three times a day (TID) | ORAL | Status: DC

## 2012-05-03 NOTE — Progress Notes (Signed)
  Subjective:    Patient ID: Michael [name], male    DOB: 11-09-1955, 57 y.o.   MRN: [id number]  HPI Michael [name] is doing well - asking for 75 oxycodone instead of 60 for the month because some days needs 3. Stools ok on Creon Still waiting for housing, remains with his sister Had facial cyst surgery  Medications, allergies, past medical history, past surgical history, family history and social history are reviewed and updated in the EMR.  Review of Systems As above    Objective:   Physical Exam NAD    Assessment & Plan:  Chronic pancreatitis - Chronic abdominal pain -  Personal history of colonic polyps   1. Creon 36K samples 2. Colonoscopy w/ obs admit due to repeated poor preps  3. Refill oxycodone-APAP #75

## 2012-05-03 NOTE — Telephone Encounter (Signed)
Michael [name] placed call to CSW earlier this week requesting add'l bus fare.  CSW informed Michael [name] add'l bus fare passes will not be available to him until 05/07/12 with documentation of upcoming medical/psychiatric appt.  Pt aware and states he will come on 4/15 to pick up fare.  Michael [name] placed call today to request CSW to provide a GoodRx coupon for his pain medication prescribed by Dr. Leone Payor.  Pt informed CSW of upcoming overnight stay for colonoscopy.  CSW discussed [facility] transportation would be available if procedure is at [facility].  Pt states he is unsure at this time but will notify CSW.  CSW left GoodRx coupon for Michael [name] at front desk.

## 2012-05-03 NOTE — Patient Instructions (Addendum)
Today we are giving you samples of Creon.  We are giving you a printed rx for Percocet.  We will contact you with the time you need to go to [facility] for prepping the day before.  Your colonoscopy will be done 05/22/12.  Please follow a clear liquid diet the day before your colonoscopy  Thank you for choosing me and [facility].  [name], M.D., [facility]   Per Joni Reining in Bed Placement she put in order for a bed on 05/21/12 so Michael [name] can get prepped at hospital.  She has sister [name]'s # [phone] to reach patient.

## 2012-05-07 ENCOUNTER — Telehealth: Payer: Self-pay | Admitting: Licensed Clinical Social Worker

## 2012-05-07 NOTE — Telephone Encounter (Signed)
Michael [name] placed call to CSW requesting assistance with bus fare.  Pt states his finances are limited at this time because he just purchased his pain medication for approx $74 at [facility].  Michael [name] also mentioned that he can not afford his Glipizide because that is not available through [facility].  Glipizide is on the $4 med listing.  Pt states his food stamps will not be available until the 24th of this month and would benefit from any "vienna sausages or potatoes" if available in pantry.  CSW will leave 3 all day bus passes available for Michael [name] at the front desk.  Pt has [facility] transport arranged for his 4/29 outpatient procedure.  Pt states he has a dermatology f/u, [facility] appt and appt with Dr. Burtis Junes.  CSW does not see add'l appt scheduled in EPIC.

## 2012-05-08 ENCOUNTER — Other Ambulatory Visit: Payer: Self-pay | Admitting: Internal Medicine

## 2012-05-08 ENCOUNTER — Telehealth: Payer: Self-pay | Admitting: Dietician

## 2012-05-08 DIAGNOSIS — E1110 Type 2 diabetes mellitus with ketoacidosis without coma: Secondary | ICD-10-CM

## 2012-05-08 NOTE — Telephone Encounter (Signed)
Needs insulin pen needles, doesn't have money to buy until the end of this month. Note pt scheduled for eye exam for 05/31/12. Also microalbumin and lipid due- will request lab appointment and order for these labs as appropriate. Leave samples of pen needles at front desk for patient to pick up.

## 2012-05-08 NOTE — Telephone Encounter (Signed)
Labs have been placed. Thank you!

## 2012-05-09 ENCOUNTER — Telehealth: Payer: Self-pay | Admitting: Internal Medicine

## 2012-05-09 NOTE — Telephone Encounter (Signed)
Spoke to Michael [name] and he said his driver can take him to [facility] to get prepped for his colonoscopy at 3pm on 05/21/12.  I told him I will call bed placement next week and confirm his orders.

## 2012-05-15 ENCOUNTER — Telehealth: Payer: Self-pay | Admitting: Licensed Clinical Social Worker

## 2012-05-15 NOTE — Telephone Encounter (Signed)
[name] placed call to CSW.  Pt distressed, upset and frustrated over current housing situation.  Pt states "I've done everything right, everything I'm supposed to do.  I'm tired of staying there."  Pt states his sister and her nurse are currently pushing all his "buttons".  [name] states he ran out of bus passes and just needs to get out of the house, pt also mentioned something about being locked out.  [name] states he met with someone currently living at [facility] and would like to obtain housing there.  Pt is familiar with agency, as we have previously completed application in 2013.  CSW encouraged [name] to calm down and work on a plan.  If obtaining housing at [facility] is desired, pt will need to have a PCP appt within the past 30 days.  Pt has an appt already scheduled on 05/31/12.  [name] will need a PPD test and verification of 21 days clean time.  CSW can provide pt with verification of homelessness.  [name] is planning to meet with CSW on 4/24 at 10am to complete application.

## 2012-05-16 ENCOUNTER — Encounter: Payer: Self-pay | Admitting: Licensed Clinical Social Worker

## 2012-05-16 ENCOUNTER — Encounter: Payer: Self-pay | Admitting: Internal Medicine

## 2012-05-16 NOTE — Progress Notes (Unsigned)
Patient ID: Michael [name], male   DOB: 1955/03/24, 57 y.o.   MRN: [id number] I spoke to Joni Reining in bed placement to confirm that Mr. Grobe order for a bed is on their list.  He is to be admitted on 05/21/12  For prepping for his colonoscopy on 05/22/12 at [facility].  For some reason the order had been changed to 05/22/12 in her system.  Joni Reining said she will correct that to 05/21/12.  She said on that day they call the patient that bed is ready.  We confirmed the phone # she had for [facility].  I told her he would like to come around 3pm because he has transportation issues.

## 2012-05-17 ENCOUNTER — Other Ambulatory Visit: Payer: Self-pay | Admitting: Internal Medicine

## 2012-05-17 DIAGNOSIS — Z5900 Homelessness unspecified: Secondary | ICD-10-CM

## 2012-05-17 NOTE — Progress Notes (Signed)
Mr. Honeyman presents to office to meet with CSW for scheduled appt.  Pt states he was frustrated with living situation yesterday, reporting food stamps did not come in on time and sister was complaining about pt eating her food.  Mr. Bekele is approved for $189 month food stamps on 4/21 but states the office is backlogged.  Pt was calm and able to state his intention for housing.  Pt called [facility] to follow up on his housing application from approx 2 years ago.  Pt was told once he is approved for SS disability his housing will be approved.  Currently, Mr. Auld is in agreement to follow through with application for [facility].  CSW informed Mr. Haymer we will need to wait for his PCP visit on 5/9 to complete application.  Also, discussed need to have current PPD.  Pt satisfied with time frame, will request if pt can receive order for PPD prior to 5/9 so that results can be read on 5/9 during scheduled appt.  Pt requesting food items since his food stamps have not been applied yet.  CSW provided Mr. Grim with several items from pantry and inquired if still receiving food from [employer].  Pt states since he moved into his sister he was unable to receive items from [employer] but pt states he will schedule another interview.  Pt states he has the number.  Mr. Canela requesting additional bus passes, stating he has a follow up dermatology appt, visit to DSS to discuss food stamps, will have appt with Mr. [facility]).  CSW provided pt with 4 one day passes.  Pt encouraged to use [facility] transportation for all [facility] appt's even with CSW.  Pt has scheduled his transportation for his procedure next week.

## 2012-05-21 ENCOUNTER — Observation Stay (HOSPITAL_COMMUNITY)
Admission: RE | Admit: 2012-05-21 | Discharge: 2012-05-22 | Disposition: A | Discharge status: Home / Self Care | Payer: Self-pay | Source: Ambulatory Visit | Attending: Internal Medicine | Admitting: Internal Medicine

## 2012-05-21 ENCOUNTER — Encounter (HOSPITAL_COMMUNITY): Payer: Self-pay | Admitting: General Practice

## 2012-05-21 DIAGNOSIS — R109 Unspecified abdominal pain: Secondary | ICD-10-CM

## 2012-05-21 DIAGNOSIS — Z09 Encounter for follow-up examination after completed treatment for conditions other than malignant neoplasm: Principal | ICD-10-CM | POA: Insufficient documentation

## 2012-05-21 DIAGNOSIS — Z860100 Personal history of colon polyps, unspecified: Secondary | ICD-10-CM | POA: Insufficient documentation

## 2012-05-21 DIAGNOSIS — F1020 Alcohol dependence, uncomplicated: Secondary | ICD-10-CM | POA: Insufficient documentation

## 2012-05-21 DIAGNOSIS — K648 Other hemorrhoids: Secondary | ICD-10-CM | POA: Insufficient documentation

## 2012-05-21 DIAGNOSIS — E119 Type 2 diabetes mellitus without complications: Secondary | ICD-10-CM | POA: Insufficient documentation

## 2012-05-21 DIAGNOSIS — Z794 Long term (current) use of insulin: Secondary | ICD-10-CM | POA: Insufficient documentation

## 2012-05-21 DIAGNOSIS — G8929 Other chronic pain: Secondary | ICD-10-CM

## 2012-05-21 DIAGNOSIS — D371 Neoplasm of uncertain behavior of stomach: Secondary | ICD-10-CM | POA: Insufficient documentation

## 2012-05-21 DIAGNOSIS — K861 Other chronic pancreatitis: Secondary | ICD-10-CM | POA: Insufficient documentation

## 2012-05-21 DIAGNOSIS — I10 Essential (primary) hypertension: Secondary | ICD-10-CM | POA: Insufficient documentation

## 2012-05-21 DIAGNOSIS — D126 Benign neoplasm of colon, unspecified: Secondary | ICD-10-CM | POA: Insufficient documentation

## 2012-05-21 DIAGNOSIS — K5730 Diverticulosis of large intestine without perforation or abscess without bleeding: Secondary | ICD-10-CM | POA: Insufficient documentation

## 2012-05-21 HISTORY — DX: Gastro-esophageal reflux disease without esophagitis: K21.9

## 2012-05-21 LAB — GLUCOSE, CAPILLARY: Glucose-Capillary: 198 mg/dL — ABNORMAL HIGH (ref 70–99)

## 2012-05-21 MED ORDER — OXYCODONE-ACETAMINOPHEN 5-325 MG PO TABS
1.0000 | ORAL_TABLET | Freq: Four times a day (QID) | ORAL | Status: DC | PRN
  Administered 2012-05-22: 1 via ORAL
  Filled 2012-05-21: qty 1

## 2012-05-21 MED ORDER — FLUOXETINE HCL 20 MG PO CAPS
40.0000 mg | ORAL_CAPSULE | Freq: Every day | ORAL | Status: DC
  Administered 2012-05-22: 40 mg via ORAL
  Filled 2012-05-21: qty 2

## 2012-05-21 MED ORDER — ARIPIPRAZOLE 15 MG PO TABS
15.0000 mg | ORAL_TABLET | Freq: Every day | ORAL | Status: DC
  Filled 2012-05-21: qty 1

## 2012-05-21 MED ORDER — HYDROCHLOROTHIAZIDE 25 MG PO TABS
25.0000 mg | ORAL_TABLET | Freq: Every day | ORAL | Status: DC
  Administered 2012-05-22: 25 mg via ORAL
  Filled 2012-05-21: qty 1

## 2012-05-21 MED ORDER — ARIPIPRAZOLE 10 MG PO TABS
22.0000 mg | ORAL_TABLET | Freq: Every day | ORAL | Status: DC
  Administered 2012-05-21: 22 mg via ORAL
  Filled 2012-05-21 (×2): qty 1

## 2012-05-21 MED ORDER — PANTOPRAZOLE SODIUM 40 MG PO TBEC
40.0000 mg | DELAYED_RELEASE_TABLET | Freq: Every day | ORAL | Status: DC
  Administered 2012-05-21 – 2012-05-22 (×2): 40 mg via ORAL
  Filled 2012-05-21 (×2): qty 1

## 2012-05-21 MED ORDER — PANCRELIPASE (LIP-PROT-AMYL) 36000-114000 UNITS PO CPEP: 1.0000 | ORAL_CAPSULE | Freq: Three times a day (TID) | ORAL | Status: DC

## 2012-05-21 MED ORDER — HYDROXYZINE HCL 10 MG PO TABS
10.0000 mg | ORAL_TABLET | Freq: Three times a day (TID) | ORAL | Status: DC | PRN
  Filled 2012-05-21: qty 1

## 2012-05-21 MED ORDER — INSULIN GLARGINE 100 UNIT/ML ~~LOC~~ SOLN: 65.0000 [IU] | Freq: Every day | SUBCUTANEOUS | Status: DC

## 2012-05-21 MED ORDER — BISACODYL 5 MG PO TBEC
5.0000 mg | DELAYED_RELEASE_TABLET | Freq: Two times a day (BID) | ORAL | Status: DC
  Administered 2012-05-21 – 2012-05-22 (×2): 5 mg via ORAL
  Filled 2012-05-21 (×2): qty 1

## 2012-05-21 MED ORDER — ARIPIPRAZOLE 5 MG PO TABS
15.0000 mg | ORAL_TABLET | Freq: Every day | ORAL | Status: DC
  Filled 2012-05-21: qty 3

## 2012-05-21 MED ORDER — PEG-KCL-NACL-NASULF-NA ASC-C 100 G PO SOLR
0.5000 | Freq: Once | ORAL | Status: AC
  Administered 2012-05-21: 50 g via ORAL
  Filled 2012-05-21: qty 1

## 2012-05-21 MED ORDER — INSULIN GLARGINE 100 UNIT/ML ~~LOC~~ SOLN: 80.0000 [IU] | Freq: Every day | SUBCUTANEOUS | Status: DC

## 2012-05-21 MED ORDER — OXYCODONE HCL 5 MG PO TABS: 5.0000 mg | ORAL_TABLET | Freq: Four times a day (QID) | ORAL | Status: DC | PRN

## 2012-05-21 MED ORDER — PANCRELIPASE (LIP-PROT-AMYL) 12000-38000 UNITS PO CPEP
1.0000 | ORAL_CAPSULE | Freq: Three times a day (TID) | ORAL | Status: DC
  Administered 2012-05-21 – 2012-05-22 (×2): 1 via ORAL
  Filled 2012-05-21 (×5): qty 1

## 2012-05-21 MED ORDER — OXYCODONE-ACETAMINOPHEN 10-325 MG PO TABS: 1.0000 | ORAL_TABLET | Freq: Four times a day (QID) | ORAL | Status: DC | PRN

## 2012-05-21 MED ORDER — PEG-KCL-NACL-NASULF-NA ASC-C 100 G PO SOLR: 1.0000 | Freq: Once | ORAL | Status: DC

## 2012-05-21 MED ORDER — ARIPIPRAZOLE 2 MG PO TABS
2.0000 mg | ORAL_TABLET | Freq: Every day | ORAL | Status: DC
  Filled 2012-05-21: qty 1

## 2012-05-21 MED ORDER — ARIPIPRAZOLE 5 MG PO TABS
5.0000 mg | ORAL_TABLET | Freq: Every day | ORAL | Status: DC
  Filled 2012-05-21: qty 1

## 2012-05-21 MED ORDER — PEG-KCL-NACL-NASULF-NA ASC-C 100 G PO SOLR
0.5000 | Freq: Once | ORAL | Status: AC
  Administered 2012-05-22: 50 g via ORAL
  Filled 2012-05-21: qty 1

## 2012-05-21 MED ORDER — INSULIN ASPART 100 UNIT/ML ~~LOC~~ SOLN
0.0000 [IU] | Freq: Three times a day (TID) | SUBCUTANEOUS | Status: DC
  Administered 2012-05-21 – 2012-05-22 (×2): 3 [IU] via SUBCUTANEOUS

## 2012-05-21 MED ORDER — HYDROCHLOROTHIAZIDE 25 MG PO TABS: 25.0000 mg | ORAL_TABLET | Freq: Every day | ORAL | Status: DC

## 2012-05-21 NOTE — H&P (Signed)
[facility] Admission Note   Primary Care Physician:  Christen Bame, MD Primary Gastroenterologist:  Dr. Leone Payor  CHIEF COMPLAINT:  Being admitted for obs and supervised bowel prep  HPI: Michael [name] is a 57 y.o. male.  Insulin dependent diabetic with chronic alcoholic pancreatitis.  Takes narcotics for chronic abdominal pain.  Had two colonoscopies in 2013, both with poor preps.  An adenoma was removed in 08/2011.  Dr Leone Payor is admitting the pt for medically supervised bowel prep. Pt is feeling overall pretty well.     Past Medical History  Diagnosis Date  . Chronic pancreatitis   . Smoker   . Gastritis due to alcohol without hemorrhage   . HTN (hypertension)   . Pancreatic lesion     appears innocent and related to chronic pancreatitis  . Heavy alcohol use     hx of  . Marijuana use     hx of  . Migraine   . Depression   . Helicobacter pylori gastritis 03/27/2011    on endoscopy, treated with omeprazole, clarithromycin and amoxicillin  . Insomnia   . ACE inhibitor-aggravated angioedema   . Sessile colonic polyp   . Diabetes mellitus   . GERD (gastroesophageal reflux disease)     Past Surgical History  Procedure Laterality Date  . Esophagogastroduodenoscopy  03/22/2011    Procedure: ESOPHAGOGASTRODUODENOSCOPY (EGD);  Surgeon: [name], MD;  Location: [facility];  Service: Endoscopy;  Laterality: N/A;  egd with balloon   . Balloon dilation  03/22/2011    Procedure: BALLOON DILATION;  Surgeon: [name], MD;  Location: [facility];  Service: Endoscopy;  Laterality: N/A;  . Eus  04/20/2011    Procedure: UPPER ENDOSCOPIC ULTRASOUND (EUS) LINEAR;  Surgeon: Rachael Fee, MD;  Location: [facility] END[facility];  Service: Endoscopy;  Laterality: N/A;  . Colonoscopy w/ biopsies and polypectomy  09/12/11    Prior to Admission medications   Medication Sig Start Date End Date Taking? Authorizing Provider  ARIPiprazole  (ABILIFY) 10 MG tablet Take 10 mg by mouth daily.    Historical Provider, MD  FLUoxetine (PROZAC) 20 MG capsule Take 40 mg by mouth daily.     Historical Provider, MD  glipiZIDE (GLUCOTROL) 5 MG tablet Take 1 tablet (5 mg total) by mouth 2 (two) times daily before a meal. 02/16/12 02/15/13  Christen Bame, MD  glucose blood (AGAMATRIX PRESTO TEST) test strip Use as instructed 06/30/11 06/29/12  Margorie John, MD  hydrochlorothiazide (HYDRODIURIL) 25 MG tablet Take 1 tablet (25 mg total) by mouth daily. 03/21/12 03/21/13  Rocco Serene, MD  hydrOXYzine (ATARAX/VISTARIL) 10 MG tablet Take 10 mg by mouth 3 (three) times daily as needed.    Historical Provider, MD  insulin glargine (LANTUS) 100 UNIT/ML injection Please take 65 units at bedtime 01/12/12 01/11/13  Christen Bame, MD  Insulin Pen Needle 31G X 5 MM MISC 1 each by Does not apply route daily. 06/15/11   Margorie John, MD  metFORMIN (GLUCOPHAGE) 500 MG tablet Take 2 tablets (1,000 mg total) by mouth 2 (two) times daily with a meal. 10/20/11   Christen Bame, MD  omeprazole (PRILOSEC) 20 MG capsule Take 1 capsule (20 mg total) by mouth daily. 03/14/11 03/13/12  [name], MD  oxyCODONE-acetaminophen (PERCOCET) 10-325 MG per tablet Take 1 tablet by mouth every 6 (six) hours as needed for pain. 05/03/12 05/03/13  [name], MD  Pancrelipase, Lip-Prot-Amyl, (CREON) 36000 UNITS CPEP Take 1 capsule by mouth 3 (three) times daily.  05/03/12   [name], MD    No current facility-administered medications for this encounter.    Allergies as of 05/03/2012 - Review Complete 05/03/2012  Allergen Reaction Noted  . Ace inhibitors Swelling 02/02/2011  . Losartan Swelling 02/03/2011    Family History  Problem Relation Age of Onset  . Colon cancer Neg Hx   . Stomach cancer Neg Hx   . Anesthesia problems Neg Hx   . Hypotension Neg Hx   . Pseudochol deficiency Neg Hx   . Malignant hyperthermia Neg Hx   . Hypertension Sister   . Diabetes Sister   . Heart  disease Sister     History   Social History  . Marital Status: Single    Spouse Name: N/A    Number of Children: 0  . Years of Education: N/A   Occupational History  . 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 Smoking status: Current Every Day Smoker -- 0.10 packs/day for 30 years    Types: Cigarettes  . Smokeless tobacco: Never Used     Comment: info given 03/14/2011. Went up a couple  . Alcohol Use: No     Comment: sobriety since 2/13,was drinking 7-10 beer/daily  . Drug Use: Yes    Special: Marijuana     Comment: Feb. 2013 last use  . Sexually Active: Not on file   Other Topics Concern  . Not on file   Social History Narrative   Living with sister currently    REVIEW OF SYSTEMS: Constitutional:  No weight loss ENT:  No nose bleeds Pulm:  Coughs when he smokes.  No sputum CV:  No palpitations or LE edema GU:  No dysuria, nocturia 2 times per night GI:  Occasional dysphagia Heme:  No hx anemia.    Transfusions:  none Neuro:  No headaches, no blurry vision.  Due to have eye exam next month Derm:  No rash or sores Endocrine:  Sugars fun 300s to 400s often, no SSI use at home Immunization:  Flu shot current Travel:  None.   Used to be homeless, lives with sister now.  Working on getting SSI disability.    PHYSICAL EXAM:  Temp:  [98.8 F (37.1 C)] 98.8 F (37.1 C) (04/29 1419) Pulse Rate:  [95] 95 (04/29 1419) Resp:  [18] 18 (04/29 1419) BP: (145)/(82) 145/82 mmHg (04/29 1419) SpO2:  [97 %] 97 % (04/29 1419) Weight:  [96.2 kg (212 lb 1.3 oz)] 96.2 kg (212 lb 1.3 oz) (04/29 1419) Last BM Date: 05/21/12  General:  Looks well, comfortable, obese Ears:  Not HOH Mouth:  Moist MM  Neck: no masses or JVD Lungs:  Clear.  No cough.  Voice hoarse   Heart:  RRR.  No MRG  Abdomen:  Obese, soft, tender in LUQ.  No mass or HSM    Rectal:  Not done  Msk:  No  joint swelling or deformity.    Pulses:  Pedal pulses 3 + bilaterally Extremities:  No pedal edema  Neurologic: pleasant, slightly anxious, no limb weakness Skin:  No rash or sores Psych:  Pleasant, cooperative.     Intake/Output from previous day:   Intake/Output this shift:    LAB RESULTS:   Endoscopic procedures 08/2011  Colonoscopy Poor prep Diminutive polyp in transverse colon Path:  Tubular adenoma.  11/2011   Colonoscopy Flat polyp at IC valve, right colon tics, poor prep Pathology:  Benign SB mucosa.  02/2011   EGD  ENDOSCOPIC IMPRESSION:  1) Tortuous in the total esophagus - mild. Dilated 54 Fr.  Suspect lack of teeth part of dysphagia problem.  2) Nodule in the antrum  3) Erythema/edema in the pyloric channel  4) Otherwise normal examination  RECOMMENDATIONS:  1) Await biopsy results  2) EUS 3/28 (Dr. Christella Hartigan) to evaluate pancreatic abnormality (?  stricture/mass)  3) Stay on omeprazole  4) Stop all alcohol  5) Consider colonoscopy pending EUS results  03/2011   EUS  Dr Christella Hartigan Impression:  Very clear changes of chronic pancreatitis throughout visualized  pancreas. There were no discrete masses however the changes of  chronic pancreatitis signficantly reduce the ability to detect  pancreatic tumors. The tail of pancreas was not seen, I suspect  it is far from the gastric wall. I could therefore not appreciate  the dilated pancreatic duct with dinstinct transition in  tail/body. Fibrosis, scar tissue from chronic pancreatitis can  cause pancreatic duct strictures. I think repeating MRI  (pancreatic protocol) with MRCP images as well in 4 weeks (would  be 6 months from last MRI) is reasonable. I explained to him that  he should completely stop drinking alcohol to decrease chances of  further damage to his pancreas.   RADIOLOGY STUDIES: No results found.   IMPRESSION:   *  Hx of colon adenoma .  Poor prep as out patient twice.   *  Chronic  pancreatitis *  Chronic narcotics for abdominal pain from the pancreatitis.  *  IDDM.  Sugars run high *  Hx H pylori + gastritis.  MRIs stable over several months.    *  Hx blood per rectum  *  Hx dysphagia.  EGD/esophageal dilatation in 02/2011 *  Chronic diarrhea.    PLAN: *   Bowel prep *  Colonoscopy tomorrow *  Hold diabetic oral meds but give Lantus insulin tonight    LOS: 0 days   Jennye Moccasin  05/21/2012, 3:12 PM Pager [phone]

## 2012-05-22 ENCOUNTER — Encounter (HOSPITAL_COMMUNITY): Payer: Self-pay

## 2012-05-22 ENCOUNTER — Telehealth: Payer: Self-pay | Admitting: Licensed Clinical Social Worker

## 2012-05-22 ENCOUNTER — Encounter (HOSPITAL_COMMUNITY)
Admission: RE | Disposition: A | Discharge status: Home / Self Care | Payer: Self-pay | Source: Ambulatory Visit | Attending: Internal Medicine

## 2012-05-22 ENCOUNTER — Encounter: Payer: Self-pay | Admitting: Internal Medicine

## 2012-05-22 DIAGNOSIS — Z86010 Personal history of colonic polyps: Secondary | ICD-10-CM

## 2012-05-22 DIAGNOSIS — D126 Benign neoplasm of colon, unspecified: Secondary | ICD-10-CM

## 2012-05-22 HISTORY — PX: COLONOSCOPY: SHX5424

## 2012-05-22 LAB — GLUCOSE, CAPILLARY: Glucose-Capillary: 137 mg/dL — ABNORMAL HIGH (ref 70–99)

## 2012-05-22 SURGERY — COLONOSCOPY
Anesthesia: Moderate Sedation

## 2012-05-22 MED ORDER — FENTANYL CITRATE 0.05 MG/ML IJ SOLN
INTRAMUSCULAR | Status: AC
  Filled 2012-05-22: qty 4

## 2012-05-22 MED ORDER — OXYCODONE-ACETAMINOPHEN 10-325 MG PO TABS: 1.0000 | ORAL_TABLET | Freq: Four times a day (QID) | ORAL | Status: DC | PRN

## 2012-05-22 MED ORDER — SODIUM CHLORIDE 0.9 % IV SOLN: INTRAVENOUS | Status: DC

## 2012-05-22 MED ORDER — OXYCODONE-ACETAMINOPHEN 5-325 MG PO TABS: 1.0000 | ORAL_TABLET | Freq: Four times a day (QID) | ORAL | Status: DC | PRN

## 2012-05-22 MED ORDER — MIDAZOLAM HCL 5 MG/ML IJ SOLN
INTRAMUSCULAR | Status: AC
  Filled 2012-05-22: qty 2

## 2012-05-22 MED ORDER — FENTANYL CITRATE 0.05 MG/ML IJ SOLN
INTRAMUSCULAR | Status: DC | PRN
  Administered 2012-05-22 (×2): 25 ug via INTRAVENOUS

## 2012-05-22 MED ORDER — MIDAZOLAM HCL 5 MG/5ML IJ SOLN
INTRAMUSCULAR | Status: DC | PRN
  Administered 2012-05-22 (×2): 2 mg via INTRAVENOUS

## 2012-05-22 NOTE — Progress Notes (Signed)
05/22/2012 11:15 AM  Pt is a moderate fall risk per the fall prevention screening tool.  I reviewed with the patient his fall risk, what that meant, and necessary interventions to keep him safe while in the hospital.  Pt verbalized understanding, however, at this time refused the bed alarm and the chair alarm.  I emphasized the importance of both of these and the reasoning behind it, to which he again refused politely.  I then instructed the patient to call me prior to getting up so that I could a least be present while he was up in case he needed anything.  He did verbalize to me that he would comply with this.  Yellow arm band and red socks were placed on the patient.  Will continue to monitor patient. Eunice Blase

## 2012-05-22 NOTE — Discharge Summary (Signed)
[facility] Discharge Summary  Name: Michael [name] MRN: [id number] DOB: 02-13-1955 57 y.o. PCP:  Christen Bame, MD  Date of Admission: 05/21/2012  2:05 PM Date of Discharge: 05/22/2012 Attending Physician: [name], MD  Admitting Dignosis: 1.  Adenomatous colon polyp in 08/2011.  "poor"/inadequate bowel prep x 2.  Admitted for medically supervised bowel prep. 2.  IDDM 3.  Chronic pancreatitis with chronic, narcotic-contolled abdominal pain.    Past Medical History  Diagnosis Date  . Chronic pancreatitis   . Smoker   . Gastritis due to alcohol without hemorrhage   . HTN (hypertension)   . Pancreatic lesion     appears innocent and related to chronic pancreatitis  . Heavy alcohol use     hx of  . Marijuana use     hx of  . Migraine   . Depression   . Helicobacter pylori gastritis 03/27/2011    on endoscopy, treated with omeprazole, clarithromycin and amoxicillin  . Insomnia   . ACE inhibitor-aggravated angioedema   . Sessile colonic polyp   . Diabetes mellitus   . GERD (gastroesophageal reflux disease)    Past Surgical History  Procedure Laterality Date  . Esophagogastroduodenoscopy  03/22/2011    Procedure: ESOPHAGOGASTRODUODENOSCOPY (EGD);  Surgeon: [name], MD;  Location: [facility];  Service: Endoscopy;  Laterality: N/A;  egd with balloon   . Balloon dilation  03/22/2011    Procedure: BALLOON DILATION;  Surgeon: [name], MD;  Location: [facility];  Service: Endoscopy;  Laterality: N/A;  . Eus  04/20/2011    Procedure: UPPER ENDOSCOPIC ULTRASOUND (EUS) LINEAR;  Surgeon: Rachael Fee, MD;  Location: [facility];  Service: Endoscopy;  Laterality: N/A;  . Colonoscopy w/ biopsies and polypectomy  09/12/11    Brief History: SHI BLANKENSHIP is a 57 y.o. insulin dependent diabetic with chronic alcoholic pancreatitis. Takes narcotics for chronic abdominal pain.  Had two colonoscopies in 2013, both with poor preps. An adenoma was removed in  08/2011. Dr Leone Payor admitted the pt for medically supervised bowel prep.    Hospital Course/Discharge Diagnosis: 1.  Adenomatous colon polyp Pt underwent succesful colonoscopy prep.  At colonoscopy, Dr Leone Payor removed small sessile polyp from the sigmoid  2.  IDDM CBGs during admission ranged 89 to 207.  These were obtained in the setting of clear liquid diet and holding both oral agents Glucophage and Glucotrol, as well as reducing Lantus insulin to 65 units the night before the procedure as a precaution against causing iatrogenic hypoglycemia.  Despite these measures the pt says these are some of the best readings he's had in a long time as he regularly is tracking #s into to the 300s to 400s.  He was reminded to bring his glucose meter to his May appt with his PMD to discuss further diabetic management strategies.     3.  Chronic abdominal pain from chronic pancreatitis. Pain well managed with Percocet.  He was supplied with monthly Rx of # 75 tablets, dated for Jun 02, 2012.    Consultations:  None  Procedures Performed:  Colonoscopy with snare polypectomy. 05/22/12    Discharge Vitals:  BP 116/71  Pulse 78  Temp(Src) 97.7 F (36.5 C) (Oral)  Resp 15  Ht 5\' 4"  (1.626 m)  Wt 96.2 kg (212 lb 1.3 oz)  BMI 36.39 kg/m2  SpO2 97%  Discharge Labs:  Results for orders placed during the hospital encounter of 05/21/12 (from the past 24 hour(s))  GLUCOSE, CAPILLARY  Status: Abnormal   Collection Time    05/21/12  4:24 PM      Result Value Range   Glucose-Capillary 198 (*) 70 - 99 mg/dL  GLUCOSE, CAPILLARY     Status: None   Collection Time    05/21/12  9:55 PM      Result Value Range   Glucose-Capillary 89  70 - 99 mg/dL  GLUCOSE, CAPILLARY     Status: Abnormal   Collection Time    05/22/12  2:56 AM      Result Value Range   Glucose-Capillary 207 (*) 70 - 99 mg/dL  GLUCOSE, CAPILLARY     Status: Abnormal   Collection Time    05/22/12  7:43 AM      Result Value Range    Glucose-Capillary 161 (*) 70 - 99 mg/dL  GLUCOSE, CAPILLARY     Status: Abnormal   Collection Time    05/22/12 11:36 AM      Result Value Range   Glucose-Capillary 137 (*) 70 - 99 mg/dL   Comment 1 Documented in Chart      Disposition and follow-up:   Mr.Edinson L Asebedo was discharged from  in stable condition.    Diet at Discharge: Carb modified.    Follow-up Appointments:     Discharge Orders   Future Appointments Provider Department Dept Phone   05/29/2012 10:00 AM Imp-Imcr Nurse [facility] [phone]   05/31/2012 12:00 PM Cecil Cranker Plyler, RD [facility] [phone]   05/31/2012 3:15 PM Christen Bame, MD [facility] [phone]   Future Orders Complete By Expires     Care order/instruction  As directed     Scheduling Instructions:      Call [place] transportation to get pt a ride home.  The phone # is [phone].    Discharge patient  As directed     Discontinue IV  As directed        Discharge Medications:   Medication List    TAKE these medications       ARIPiprazole 15 MG tablet  Commonly known as:  ABILIFY  Take 22 mg by mouth at bedtime. Takes 15mg  tab, a 5mg  tab, and a 2mg  tab for 22mg  nightly     aspirin 325 MG tablet  Take 325 mg by mouth daily as needed for pain.     FLUoxetine 20 MG capsule  Commonly known as:  PROZAC  Take 40 mg by mouth daily.     glipiZIDE 10 MG tablet  Commonly known as:  GLUCOTROL  Take 5 mg by mouth 2 (two) times daily before a meal.     hydrochlorothiazide 25 MG tablet  Commonly known as:  HYDRODIURIL  Take 25 mg by mouth daily.     hydrOXYzine 10 MG tablet  Commonly known as:  ATARAX/VISTARIL  Take 10 mg by mouth 3 (three) times daily as needed for itching.     insulin glargine 100 UNIT/ML injection  Commonly known as:  LANTUS  Inject 65 Units into the skin at bedtime.     metFORMIN 500 MG tablet  Commonly known as:  GLUCOPHAGE  Take 1,000  mg by mouth 2 (two) times daily with a meal.     omeprazole 20 MG capsule  Commonly known as:  PRILOSEC  Take 20 mg by mouth daily.     oxyCODONE-acetaminophen 10-325 MG per tablet  Commonly known as:  PERCOCET  Take 1 tablet by  mouth every 6 (six) hours as needed for pain.              Pancrelipase (Lip-Prot-Amyl) 36000 UNITS Cpep  Take 1 capsule by mouth 3 (three) times daily.        Time Spent on discharge: 20 minutes.    Signed: Kasandra Knudsen  [phone] 05/22/2012, 1:40 PM

## 2012-05-22 NOTE — Progress Notes (Signed)
Patient discharged to home. Patient AVS reviewed. Patient provided with prescriptions. Patient verbalized understanding of medications and follow-up appointments.  Patient remains stable; no signs or symptoms of distress.  Patient educated to go to ER in cases of rectal bleeding, SOB, dizziness, fever, severe abdominal pain, chest pain, or fainting.

## 2012-05-22 NOTE — Telephone Encounter (Signed)
Mr. Persons placed call to CSW to discuss needs for medical transportation.  Pt currently at [facility] for procedure.  CSW informed Mr. Brindisi this worker does not have additional bus passes for pt at this time.  CSW provided Mr. Dittus with multiple passes last week to last through his May 9th appt.  CSW informed Mr. Primm to continue to use [facility] for all medical appt's to [facility].  Pt aware he is to come to [facility] on Wed. May 7th for PPD test, which will be read during his scheduled appt on May 9th.  Pt can use [employer] transportation for both appt's.  Pt notified CSW may have more passes available middle of May depending of his scheduled appt's.

## 2012-05-22 NOTE — Progress Notes (Signed)
     [facility] Daily Rounding Note 05/22/2012, 8:47 AM  SUBJECTIVE:       Prep went well.  It is complete.  2 RNs and pt verify stools to be consistently clear.  Pt feels well.  Blood sugars as low as 89 and 160, numbers he has not seen in a long time.  OBJECTIVE:         Vital signs in last 24 hours:    Temp:  [97.4 F (36.3 C)-98.8 F (37.1 C)] 97.4 F (36.3 C) (04/30 0430) Pulse Rate:  [68-95] 68 (04/30 0430) Resp:  [18] 18 (04/30 0430) BP: (96-145)/(58-82) 96/58 mmHg (04/30 0430) SpO2:  [97 %-99 %] 97 % (04/30 0430) Weight:  [96.2 kg (212 lb 1.3 oz)] 96.2 kg (212 lb 1.3 oz) (04/29 1419) Last BM Date: 05/22/12 General: pleasant, talkative   Heart: RRR Chest: clear.  No breathing problems or cough Abdomen: soft, obese, NT, active BS  Extremities: no LE edema Neuro/Psych:  Pleasant, a bit anxious but not agitation.  Not disoriented. .   Intake/Output from previous day: 04/29 0701 - 04/30 0700 In: 240 [P.O.:240] Out: -   Intake/Output this shift:    Lab Results: None ordered. CBG (last 3)   Recent Labs  05/21/12 2155 05/22/12 0256 05/22/12 0743  GLUCAP 89 207* 161*     Studies/Results: None ordered.   ASSESMENT: * Hx of colon adenoma . Poor prep as out patient twice. Succesfully completed  inpt prep for colonoscopy today.  * Chronic pancreatitis  * Chronic narcotics for abdominal pain from the pancreatitis.  * IDDM. Sugars run high at home, not as bad currently, coverage with SSI.  Oral agents held in setting of bowel prep * Hx H pylori + gastritis. MRIs stable over several months.  * Hx blood per rectum  * Hx dysphagia. EGD/esophageal dilatation in 02/2011  * Chronic diarrhea.    PLAN: *  Colonoscopy today *  Phone # for [employer] transportation is [phone].  They will be transporting him back home and require a 2 hour heads up.  They cease pick up after 5:30 pm.    LOS: 1 day   Jennye Moccasin  05/22/2012, 8:47 AM Pager: [phone]

## 2012-05-22 NOTE — Op Note (Signed)
[facility] [address] [place], [address]   COLONOSCOPY PROCEDURE REPORT  PATIENT: Michael [name], Michael [name]  MR#: [id number] BIRTHDATE: 1955/10/17 , 56  yrs. old GENDER: Male ENDOSCOPIST: [name], MD, [facility] PROCEDURE DATE:  05/22/2012 PROCEDURE:   Colonoscopy with snare polypectomy ASA CLASS:   Class II INDICATIONS:Patient's personal history of colon polyps. MEDICATIONS: Fentanyl 50 mcg IV and Versed 4 mg IV  DESCRIPTION OF PROCEDURE:   After the risks benefits and alternatives of the procedure were thoroughly explained, informed consent was obtained.  A digital rectal exam revealed no abnormalities of the rectum and A digital rectal exam revealed the prostate was not enlarged.   The Pentax Adult Colon [id number] and Pentax Ped Colon [id number]  endoscope was introduced through the anus and advanced to the cecum, which was identified by both the appendix and ileocecal valve. No adverse events experienced.   The quality of the prep was good, using MoviPrep  The instrument was then slowly withdrawn as the colon was fully examined.      COLON FINDINGS: A diminutive polypoid shaped sessile polyp was found in the sigmoid colon.  A polypectomy was performed with a cold snare.  The resection was complete and the polyp tissue was completely retrieved.   Moderate diverticulosis was noted throughout the entire examined colon.   Small internal hemorrhoids were found.   The colon mucosa was otherwise normal.  Retroflexed views revealed internal hemorrhoids. The time to cecum=1 minutes 0 seconds.  Withdrawal time=10 minutes 0 seconds.  The scope was withdrawn and the procedure completed. COMPLICATIONS: There were no complications.  ENDOSCOPIC IMPRESSION: 1.   Diminutive sessile polyp was found in the sigmoid colon; polypectomy was performed with a cold snare 2.   Moderate diverticulosis was noted throughout the entire examined colon 3.   Small internal  hemorrhoids 4.   The colon mucosa was otherwise normal  RECOMMENDATIONS: Timing of repeat colonoscopy will be determined by pathology findings.   eSigned:  [name], MD, [facility] 05/22/2012 12:43 PM

## 2012-05-23 ENCOUNTER — Encounter: Payer: Self-pay | Admitting: Internal Medicine

## 2012-05-23 ENCOUNTER — Encounter (HOSPITAL_COMMUNITY): Payer: Self-pay | Admitting: Internal Medicine

## 2012-05-23 NOTE — Progress Notes (Signed)
Quick Note:  Diminutive adenoma Repeat colon 2019 ______

## 2012-05-27 ENCOUNTER — Telehealth: Payer: Self-pay | Admitting: Internal Medicine

## 2012-05-27 ENCOUNTER — Telehealth: Payer: Self-pay | Admitting: Licensed Clinical Social Worker

## 2012-05-27 NOTE — Telephone Encounter (Signed)
Pt placed call to CSW to state need for cane.  Mr. Shake states he was informed that he should have a cane to assist with his walking. Pt inquiring if he could receive a cane during his 5/9 appt.  CSW informed [name] he could need a medical order for a cane and would need to apply for Financial Assistance through [facility].  [name] aware PCP will need to have a medical reason as to why a cane would be medically necessary and this can be done during this 5/9 visit.  CSW will provide pt with financial assistance form during his 5/9 appt.

## 2012-05-27 NOTE — Telephone Encounter (Signed)
Spoke to [place], questions answered and Dr. Leone Payor will go into detail about colonoscopy results at patients next visit in June.  He was comfortable with this.

## 2012-05-28 ENCOUNTER — Telehealth: Payer: Self-pay | Admitting: Licensed Clinical Social Worker

## 2012-05-28 NOTE — Telephone Encounter (Signed)
[name] placed call in to CSW requesting bus passes.  CSW informed [name] no passes are available to him until 5/9.  Pt discussed his results from colonoscopy and letter from Dr. Leone [name].  CSW provided emotional encouragement and support.  Pt will be in [facility] on Wed. 5/7 and Friday 5/9.  Pt has yet to complete his [facility] card, as he is having difficulty getting sister to notarize letter regarding support/residency.  CSW encouraged [name] to work on completing this letter as his Michael [name] medical transportation is tied to his [facility] card.  Offered pt to discuss different options available with [facility] for getting letter notarized.

## 2012-05-29 ENCOUNTER — Ambulatory Visit (INDEPENDENT_AMBULATORY_CARE_PROVIDER_SITE_OTHER): Payer: Self-pay | Admitting: *Deleted

## 2012-05-29 DIAGNOSIS — Z111 Encounter for screening for respiratory tuberculosis: Secondary | ICD-10-CM

## 2012-05-31 ENCOUNTER — Encounter: Payer: Self-pay | Admitting: Internal Medicine

## 2012-05-31 ENCOUNTER — Ambulatory Visit (INDEPENDENT_AMBULATORY_CARE_PROVIDER_SITE_OTHER): Payer: Self-pay | Admitting: Internal Medicine

## 2012-05-31 ENCOUNTER — Ambulatory Visit: Payer: Self-pay | Admitting: Dietician

## 2012-05-31 VITALS — BP 131/81 | HR 97 | Temp 98.4°F | Ht 63.0 in | Wt 218.9 lb

## 2012-05-31 DIAGNOSIS — E1165 Type 2 diabetes mellitus with hyperglycemia: Secondary | ICD-10-CM

## 2012-05-31 DIAGNOSIS — IMO0001 Reserved for inherently not codable concepts without codable children: Secondary | ICD-10-CM

## 2012-05-31 DIAGNOSIS — IMO0002 Reserved for concepts with insufficient information to code with codable children: Secondary | ICD-10-CM

## 2012-05-31 LAB — GLUCOSE, CAPILLARY: Glucose-Capillary: 339 mg/dL — ABNORMAL HIGH (ref 70–99)

## 2012-05-31 LAB — TB SKIN TEST: TB Skin Test: NEGATIVE

## 2012-05-31 LAB — HM DIABETES EYE EXAM

## 2012-05-31 LAB — POCT GLYCOSYLATED HEMOGLOBIN (HGB A1C): Hemoglobin A1C: 13.1

## 2012-05-31 NOTE — Progress Notes (Signed)
Subjective:   Patient ID: Michael [name] male   DOB: [birth year] 57 y.o.   MRN: [id number]  HPI: [name] is a 57 y.o. AA man with a past history of chronic pancreatitis, hypertension, polysubstance abuse in the past, and diabetes very poorly controlled secondary to patient's low health literacy and noncompliance with medications as completed by his psychiatric disorders. Patient comes in for followup after a dermal cyst removal and questions regarding the need for an ambulatory cane. Patient has been doing overall well since his colonoscopy that revealed cecal polyps and followup is scheduled next month with Dr. Leone Payor and repeat colonoscopy in 5 years. Patient continues to state that he has some financial difficulties and that may decrease his ability to get all of his medicines but feels that he prioritizes his diabetes medication. It was reviewed with the patient in terms of the significant setting worsening of his diabetes after we had previously reached better control. The patient is reluctant to share other barriers or if he has been compensating by either not taking medication as prescribed or tried to stretch medication. Patient continues to smoke but is only a pre-contemplation stage at this time. Patient has not been having any glycemic episodes but hypoglycemic episodes as high as the 400s he does have some polydipsia and polyuria but that has decreased since our last visit patient has not had any diaphoresis or loss of consciousness. Patient denied any fevers, chills, dysuria, pyuria, nausea/vomiting/diarrhea above his baseline given his chronic pancreatitis. The patient's medical history and social history was reviewed again along with recent consult notes from GI and dermatology these results were reviewed with patient and all questions answered.   Past Medical History  Diagnosis Date  . Chronic pancreatitis   . Smoker   . Gastritis due to alcohol without hemorrhage   . HTN  (hypertension)   . Pancreatic lesion     appears innocent and related to chronic pancreatitis  . Heavy alcohol use     hx of  . Marijuana use     hx of  . Migraine   . Depression   . Helicobacter pylori gastritis 03/27/2011    on endoscopy, treated with omeprazole, clarithromycin and amoxicillin  . Insomnia   . ACE inhibitor-aggravated angioedema   . Sessile colonic polyp   . Diabetes mellitus   . GERD (gastroesophageal reflux disease)    Current Outpatient Prescriptions  Medication Sig Dispense Refill  . ARIPiprazole (ABILIFY) 15 MG tablet Take 22 mg by mouth at bedtime. Takes 15mg  tab, a 5mg  tab, and a 2mg  tab for 22mg  nightly      . aspirin 325 MG tablet Take 325 mg by mouth daily as needed for pain.      Marland Kitchen FLUoxetine (PROZAC) 20 MG capsule Take 40 mg by mouth daily.       Marland Kitchen glipiZIDE (GLUCOTROL) 10 MG tablet Take 5 mg by mouth 2 (two) times daily before a meal.      . hydrochlorothiazide (HYDRODIURIL) 25 MG tablet Take 25 mg by mouth daily.      . hydrOXYzine (ATARAX/VISTARIL) 10 MG tablet Take 10 mg by mouth 3 (three) times daily as needed for itching.       . insulin glargine (LANTUS) 100 UNIT/ML injection Inject 65 Units into the skin at bedtime.      . metFORMIN (GLUCOPHAGE) 500 MG tablet Take 1,000 mg by mouth 2 (two) times daily with a meal.      . omeprazole (PRILOSEC)  20 MG capsule Take 20 mg by mouth daily.      . Pancrelipase, Lip-Prot-Amyl, 36000 UNITS CPEP Take 1 capsule by mouth 3 (three) times daily.       No current facility-administered medications for this visit.   Family History  Problem Relation Age of Onset  . Colon cancer Neg Hx   . Stomach cancer Neg Hx   . Anesthesia problems Neg Hx   . Hypotension Neg Hx   . Pseudochol deficiency Neg Hx   . Malignant hyperthermia Neg Hx   . Hypertension Sister   . Diabetes Sister   . Heart disease Sister    History   Social History  . Marital Status: Single    Spouse Name: N/A    Number of Children: 0  .  Years of Education: N/A   Occupational History  .    [employer]    worked for 6 years  . 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 Smoking status: Current Every Day Smoker -- 0.10 packs/day for 30 years    Types: Cigarettes  . Smokeless tobacco: Never Used     Comment: info given 03/14/2011. Went up a couple  . Alcohol Use: No     Comment: sobriety since 2/13,was drinking 7-10 beer/daily  . Drug Use: Yes    Special: Marijuana     Comment: Feb. 2013 last use  . Sexually Active: None   Other Topics Concern  . None   Social History Narrative   Has been living at 3 different friend's homes since discharge 01/2011. Was living in the shelter prior to admission. Had previously lived with his niece and other family members but he has gotten kicked out each time.    Review of Systems: otherwise negative unless listed in HPI  Objective:  Physical Exam: Filed Vitals:   05/31/12 1344  BP: 131/81  Pulse: 97  Temp: 98.4 F (36.9 C)  TempSrc: Oral  Height: 5\' 3"  (1.6 m)  Weight: 218 lb 14.4 oz (99.292 kg)  SpO2: 97%   General: resting in bed HEENT: PERRL, EOMI, no scleral icterus Cardiac: RRR, no rubs, murmurs or gallops Pulm: clear to auscultation bilaterally, moving normal volumes of air Abd: soft, nontender, nondistended, BS present Ext: warm and well perfused, no pedal edema Neuro: alert and oriented X3, cranial nerves II-XII grossly intact  Assessment & Plan:  1.poorly controlled diabetes mellitus: The patient seems to have had an acute decompensation in his diabetes control this may be secondary to recent financial strains and patient stretching medication as he had achieved much better glycemic control on the exactly same medication treatments. The patient also was having more frequent office visits previously which may have contributed to better control as the patient has very low medical literacy and needs to  feel accountable for his care. -Therefore will continue metformin 1000 mg twice a day, glipizide 10 mg twice a day, Lantus 65 units each bedtime -Retinal scan today -Follow up in 2 weeks to discuss diabetes management -Followup with diabetes educator -Referral to podiatrist was made during last visit  Patient was discussed with Dr. Lytle Butte

## 2012-05-31 NOTE — Patient Instructions (Signed)
Goals (1 Years of Data) as of 05/31/12         As of Today 05/22/12 05/22/12 05/22/12 05/22/12     Blood Pressure    . Blood Pressure < 140/90  131/81 116/71 118/78 118/78 126/76    . Blood Pressure < 140/90  131/81 116/71 118/78 118/78 126/76     Result Component    . HEMOGLOBIN A1C < 7.0  13.1        . HEMOGLOBIN A1C < 7.0  13.1        . LDL CALC < 100          . LDL CALC < 100          it was a pleasure to see today Michael [name] we continued to need to work on your diabetes and I think it's a consequence of having all the GI procedures and colonoscopies. We were doing very well in terms of managing your diabetes and think we need to increase our appointments about every 2 weeks until we can get it under better control.  I would like you to continue taking in the morning: Metformin 1000 mg, glipizide 10 mg and then in the evening: Metformin 1000 mg, glipizide 10 mg, Lantus 65 units. I would also like you to continue seeing Michael [name] our diabetes educator in terms of your meal plans and helping you make good choices at the community food shelf.  If you have any questions do not hesitate to call the clinic: [phone]

## 2012-05-31 NOTE — Progress Notes (Signed)
completed

## 2012-06-03 NOTE — Progress Notes (Signed)
INTERNAL MEDICINE TEACHING ATTENDING ADDENDUM: I discussed this case with Dr. Sadek soon after the patient visit. I have read the documentation and I agree with the plan of care. Please see the resident note for details of management.  

## 2012-06-05 ENCOUNTER — Encounter: Payer: Self-pay | Admitting: Licensed Clinical Social Worker

## 2012-06-05 NOTE — Progress Notes (Signed)
Patient ID: Michael [name], male   DOB: 1955/08/29, 58 y.o.   MRN: [id number] CSW faxed pt's completed housing application to [facility].  Noted [facility] records were requested to be sent directly to [facility].  [facility] faxed with application.

## 2012-06-12 ENCOUNTER — Telehealth: Payer: Self-pay | Admitting: Licensed Clinical Social Worker

## 2012-06-12 ENCOUNTER — Encounter: Payer: Self-pay | Admitting: Licensed Clinical Social Worker

## 2012-06-12 NOTE — Progress Notes (Signed)
Pt has requested bus passes for his upcoming appt's at Dr. Marvell Fuller office and [place].  Michael [name] will be in for a scheduled appt on 06/14/12.  CSW will provide letter with bus fare and community resources.  Letter will indicate no additional one way fare passes will be available to Michael [name] until Fall 2014.

## 2012-06-12 NOTE — Telephone Encounter (Signed)
Pt called in to CSW indicating that he had no transportation.  CSW informed Michael [name] 4 one way passes will be left for his pick up during his 5/23 [facility] appt.  CSW informed pt these bus passes should be used for [employer] and [employer] for month of June.  No additional one way fare will be available to Michael [name] until Aug/Sept 2014.  Pt requesting if tickets could be picked up today, CSW left tickets and letter of notification at front desk.

## 2012-06-14 ENCOUNTER — Encounter: Payer: Self-pay | Admitting: Internal Medicine

## 2012-06-14 ENCOUNTER — Ambulatory Visit (INDEPENDENT_AMBULATORY_CARE_PROVIDER_SITE_OTHER): Payer: Self-pay | Admitting: Internal Medicine

## 2012-06-14 VITALS — BP 119/78 | HR 95 | Temp 98.5°F | Ht 63.0 in | Wt 218.8 lb

## 2012-06-14 DIAGNOSIS — K8689 Other specified diseases of pancreas: Secondary | ICD-10-CM

## 2012-06-14 DIAGNOSIS — IMO0002 Reserved for concepts with insufficient information to code with codable children: Secondary | ICD-10-CM

## 2012-06-14 DIAGNOSIS — IMO0001 Reserved for inherently not codable concepts without codable children: Secondary | ICD-10-CM

## 2012-06-14 DIAGNOSIS — E1165 Type 2 diabetes mellitus with hyperglycemia: Secondary | ICD-10-CM

## 2012-06-14 DIAGNOSIS — K869 Disease of pancreas, unspecified: Secondary | ICD-10-CM

## 2012-06-14 DIAGNOSIS — E785 Hyperlipidemia, unspecified: Secondary | ICD-10-CM

## 2012-06-14 LAB — GLUCOSE, CAPILLARY: Glucose-Capillary: 419 mg/dL — ABNORMAL HIGH (ref 70–99)

## 2012-06-14 LAB — LIPID PANEL

## 2012-06-14 MED ORDER — GLIPIZIDE 10 MG PO TABS: 10.0000 mg | ORAL_TABLET | Freq: Two times a day (BID) | ORAL | Status: DC

## 2012-06-14 MED ORDER — INSULIN GLARGINE 100 UNIT/ML ~~LOC~~ SOLN: 70.0000 [IU] | Freq: Every day | SUBCUTANEOUS | Status: DC

## 2012-06-14 MED ORDER — ASPIRIN 81 MG PO TABS: 81.0000 mg | ORAL_TABLET | Freq: Every day | ORAL | Status: DC

## 2012-06-14 MED ORDER — METFORMIN HCL 1000 MG PO TABS: 1000.0000 mg | ORAL_TABLET | Freq: Two times a day (BID) | ORAL | Status: DC

## 2012-06-14 NOTE — Assessment & Plan Note (Addendum)
CBG continues to be elevated between 200-400. Patient just started to take his medication on a daily basis. I will increase Lantus from 65 units to 70 units daily and continue glipizide 10 mg twice a day and metformin thousand grams twice a day. Patient seems very motivated. Handout about diabetes and chronic pancreatitis was given. Prescription were provided. Will reevaluate patient in one month. Patient most likely needs meal coverage but this may be too complicated for the patient at this point. Underlined the importance to continue to take medication a daily basis and check blood sugars on a regular basis.  Retinal exam performed during last office visit which was within normal limits Will obtain lipid panel and urine microalbumin creatinine ratio today

## 2012-06-14 NOTE — Progress Notes (Signed)
Subjective:   Patient ID: Michael [name] male   DOB: 1955-09-08 57 y.o.   MRN: [id number]  HPI: Michael [name] is a 57 y.o. male with past medical history significant as outlined below who presented to the clinic diabetes management followup. Patient noted that he is currently taking all his medication on a daily basis. He has been checking his blood sugars. CBG reading are:  Before breakfast: 255 -316 since restarted on medication After meals 256-400     Past Medical History  Diagnosis Date  . Chronic pancreatitis   . Smoker   . Gastritis due to alcohol without hemorrhage   . HTN (hypertension)   . Pancreatic lesion     appears innocent and related to chronic pancreatitis  . Heavy alcohol use     hx of  . Marijuana use     hx of  . Migraine   . Depression   . Helicobacter pylori gastritis 03/27/2011    on endoscopy, treated with omeprazole, clarithromycin and amoxicillin  . Insomnia   . ACE inhibitor-aggravated angioedema   . Sessile colonic polyp   . Diabetes mellitus   . GERD (gastroesophageal reflux disease)    Current Outpatient Prescriptions  Medication Sig Dispense Refill  . ARIPiprazole (ABILIFY) 15 MG tablet Take 22 mg by mouth at bedtime. Takes 15mg  tab, a 5mg  tab, and a 2mg  tab for 22mg  nightly      . aspirin 325 MG tablet Take 325 mg by mouth daily as needed for pain.      Marland Kitchen FLUoxetine (PROZAC) 20 MG capsule Take 40 mg by mouth daily.       Marland Kitchen glipiZIDE (GLUCOTROL) 10 MG tablet Take 5 mg by mouth 2 (two) times daily before a meal.      . hydrochlorothiazide (HYDRODIURIL) 25 MG tablet Take 25 mg by mouth daily.      . hydrOXYzine (ATARAX/VISTARIL) 10 MG tablet Take 10 mg by mouth 3 (three) times daily as needed for itching.       . insulin glargine (LANTUS) 100 UNIT/ML injection Inject 65 Units into the skin at bedtime.      . metFORMIN (GLUCOPHAGE) 500 MG tablet Take 1,000 mg by mouth 2 (two) times daily with a meal.      . omeprazole (PRILOSEC) 20 MG capsule  Take 20 mg by mouth daily.      . Pancrelipase, Lip-Prot-Amyl, 36000 UNITS CPEP Take 1 capsule by mouth 3 (three) times daily.       No current facility-administered medications for this visit.   Family History  Problem Relation Age of Onset  . Colon cancer Neg Hx   . Stomach cancer Neg Hx   . Anesthesia problems Neg Hx   . Hypotension Neg Hx   . Pseudochol deficiency Neg Hx   . Malignant hyperthermia Neg Hx   . Hypertension Sister   . Diabetes Sister   . Heart disease Sister    History   Social History  . Marital Status: Single    Spouse Name: N/A    Number of Children: 0  . Years of Education: 11   Occupational History  .    [employer]    worked for 6 years  . 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 Smoking status: Current Every Day Smoker -- 0.20 packs/day for 30 years    Types: Cigarettes  . Smokeless tobacco:  Never Used     Comment: 4 -5 cigs/day.Wants to quit.  . Alcohol Use: No     Comment: sobriety since 2/13,was drinking 7-10 beer/daily  . Drug Use: Not on file     Comment: Feb. 2013 last use  . Sexually Active: Not on file   Other Topics Concern  . Not on file   Social History Narrative   Has been living at 3 different friend's homes since discharge 01/2011. Was living in the shelter prior to admission. Had previously lived with his niece and other family members but he has gotten kicked out each time.    Review of Systems: Constitutional: Denies fever, chills, diaphoresis, appetite change and fatigue.  Respiratory: Denies SOB, DOE, cough, chest tightness,  and wheezing.   Cardiovascular: Denies chest pain, palpitations and leg swelling.  Genitourinary: Denies dysuria, urgency, frequency, hematuria, flank pain and difficulty urinating.  Endocrine: Noted polydipsia.  Skin: Denies pallor, rash and wound.  Neurological: Denies dizziness, Hematological: Denies adenopathy.  Objective:   Physical Exam: Filed Vitals:   06/14/12 1359  BP: 119/78  Pulse: 95  Temp: 98.5 F (36.9 C)  TempSrc: Oral  Height: 5\' 3"  (1.6 m)  Weight: 218 lb 12.8 oz (99.247 kg)  SpO2: 96%   Constitutional: Vital signs reviewed.  Patient is a well-developed and well-nourished male in no acute distress and cooperative with exam. Alert and oriented x3.  Eyes: PERRL, EOMI, conjunctivae normal, No scleral icterus.  Neck: Supple,  Cardiovascular: RRR, S1 normal, S2 normal, no MRG, pulses symmetric and intact bilaterally Pulmonary/Chest: CTAB, no wheezes, rales, or rhonchi Abdominal: Soft. Non-tender, non-distended, bowel sounds are normal,  Neurological: A&O x3

## 2012-06-14 NOTE — Patient Instructions (Addendum)
Will increase Lantus to 70 units at bedtime .   Start taking Aspirin 81 mg daily.   Please bring all your medication and you meter during the next office visit

## 2012-06-15 LAB — MICROALBUMIN / CREATININE URINE RATIO
Creatinine, Urine: 46.7 mg/dL
Microalb, Ur: 0.5 mg/dL (ref 0.00–1.89)

## 2012-06-18 ENCOUNTER — Telehealth: Payer: Self-pay | Admitting: Internal Medicine

## 2012-06-18 ENCOUNTER — Other Ambulatory Visit: Payer: Self-pay | Admitting: Internal Medicine

## 2012-06-18 DIAGNOSIS — E785 Hyperlipidemia, unspecified: Secondary | ICD-10-CM

## 2012-06-18 LAB — LDL CHOLESTEROL, DIRECT: Direct LDL: 89 mg/dL

## 2012-06-18 MED ORDER — LOVASTATIN 40 MG PO TABS: 40.0000 mg | ORAL_TABLET | Freq: Every day | ORAL | Status: DC

## 2012-06-18 NOTE — Addendum Note (Signed)
Addended by: Almyra Deforest on: 06/18/2012 11:50 AM   Modules accepted: Orders

## 2012-06-18 NOTE — Telephone Encounter (Signed)
Discussed with patient about dyslipidemia. Informed him that I would start him on nystatin. A prescription was sent to the pharmacy.

## 2012-06-20 ENCOUNTER — Telehealth: Payer: Self-pay | Admitting: Licensed Clinical Social Worker

## 2012-06-20 NOTE — Telephone Encounter (Signed)
[name] placed call to CSW.  Pt states he attempted to call front line but no answer.  Front line has prompts that would have taken pt to the appropriate extension.  Pt states his new Rx, Lovastatin was called in to a pharmacy that is not near him, [facility] on [address].  Pt would like medication called in to a pharmacy near him.  Pt aware cost is approx $20.  CSW informed Michael [name] of the [facility], which has a $15/annual fee, however, Lovastatin is on the listing for $11.99 for 90 days with health savings pass.  Pt states [facility] [address]  Pt will sign up for the Health Savings Pass.  [facility], however, [facility] unwilling to transfer just stated they would "[facility] back and [facility] call [facility]".  CSW provided information for Triage RN to assist.

## 2012-06-25 ENCOUNTER — Telehealth: Payer: Self-pay | Admitting: Licensed Clinical Social Worker

## 2012-06-25 NOTE — Telephone Encounter (Signed)
Mr. Clinkscale placed call to CSW requesting update med list and most recent progress not to be faxed to [facility].  ROI on chart.  CSW faxed above to [facility].  Pt in good spirits, states he is starting a reading program this month and utilized the [facility] transportation for [facility] appt, and able to remove himself for negative environments.  CSW provided emotional encouragement.

## 2012-06-25 NOTE — Addendum Note (Signed)
Addended by: Remus Blake on: 06/25/2012 11:01 AM   Modules accepted: Orders

## 2012-06-25 NOTE — Addendum Note (Signed)
Addended by: Remus Blake on: 06/25/2012 11:03 AM   Modules accepted: Orders

## 2012-06-26 NOTE — Addendum Note (Signed)
Addended by: Bufford Spikes on: 06/26/2012 11:09 AM   Modules accepted: Orders

## 2012-06-27 ENCOUNTER — Ambulatory Visit (INDEPENDENT_AMBULATORY_CARE_PROVIDER_SITE_OTHER): Payer: Self-pay | Admitting: Internal Medicine

## 2012-06-27 ENCOUNTER — Encounter: Payer: Self-pay | Admitting: Internal Medicine

## 2012-06-27 ENCOUNTER — Telehealth: Payer: Self-pay

## 2012-06-27 VITALS — BP 108/68 | HR 100 | Ht 63.0 in | Wt 220.4 lb

## 2012-06-27 DIAGNOSIS — K861 Other chronic pancreatitis: Secondary | ICD-10-CM

## 2012-06-27 DIAGNOSIS — R109 Unspecified abdominal pain: Secondary | ICD-10-CM

## 2012-06-27 DIAGNOSIS — K860 Alcohol-induced chronic pancreatitis: Secondary | ICD-10-CM

## 2012-06-27 DIAGNOSIS — G8929 Other chronic pain: Secondary | ICD-10-CM

## 2012-06-27 MED ORDER — OXYCODONE-ACETAMINOPHEN 10-325 MG PO TABS: 1.0000 | ORAL_TABLET | Freq: Four times a day (QID) | ORAL | Status: DC | PRN

## 2012-06-27 NOTE — Assessment & Plan Note (Signed)
Refill oxycodone acetaminophen today #75. As best I can tell he is been reliable, I have not seen any other dispense is on the state narcotic database. Urine testing today.

## 2012-06-27 NOTE — Progress Notes (Signed)
  Subjective:    Patient ID: Michael [name], male    DOB: Sep 22, 1955, 57 y.o.   MRN: [id number]  HPI Doing well without new complaints. Disability hearing in September  Review of Systems     Objective:   Physical Exam NAD       Assessment & Plan:  Chronic pancreatitis - Plan: oxyCODONE-acetaminophen (PERCOCET) 10-325 MG per tablet, Drug Screen, Urine  Chronic abdominal pain on narcotics - Plan: oxyCODONE-acetaminophen (PERCOCET) 10-325 MG per tablet, Drug Screen, Urine  REV 6 months Refill oxycodone APAP and Creon samples today

## 2012-06-27 NOTE — Patient Instructions (Addendum)
You have been given a separate informational sheet regarding your tobacco use, the importance of quitting and local resources to help you quit.  Today you have been given a printed rx for the Percocet to fill when needed.  We are providing you with samples of the creon to use as directed.  Follow up with [place] in 6 months.   I appreciate the opportunity to care for you.

## 2012-06-27 NOTE — Assessment & Plan Note (Signed)
Things seem stable. Continue narcotics and Creon. See me in 6 months.

## 2012-06-27 NOTE — Telephone Encounter (Signed)
Spoke with patient and told him we need to check a urine (drug screen), orders in epic.  I had failed to send him to lab when he was here today.  He is going to come 07/05/12.

## 2012-07-05 ENCOUNTER — Telehealth: Payer: Self-pay | Admitting: Internal Medicine

## 2012-07-05 ENCOUNTER — Other Ambulatory Visit: Payer: Self-pay

## 2012-07-05 DIAGNOSIS — G8929 Other chronic pain: Secondary | ICD-10-CM

## 2012-07-05 DIAGNOSIS — K861 Other chronic pancreatitis: Secondary | ICD-10-CM

## 2012-07-06 LAB — DRUG SCREEN, URINE
Barbiturate Quant, Ur: NEGATIVE
Benzodiazepines.: NEGATIVE
Methadone: NEGATIVE
Propoxyphene: NEGATIVE

## 2012-07-08 ENCOUNTER — Telehealth: Payer: Self-pay

## 2012-07-08 NOTE — Progress Notes (Signed)
Quick Note:  Urine screen is negative for opiates  This indicates he is not taking his narcotics so will not prescribe further and he will be discharged from the practice  Please let him know and I will do the letter ______

## 2012-07-08 NOTE — Telephone Encounter (Signed)
Left message for patient to call back Dismissal form sent to Dr. Leone Payor to sign

## 2012-07-08 NOTE — Telephone Encounter (Signed)
Message copied by Annett Fabian on Mon Jul 08, 2012  4:07 PM ------      Message from: Stan Head E      Created: Mon Jul 08, 2012  8:25 AM       Urine screen is negative for opiates            This indicates he is not taking his narcotics so will not prescribe further and he will be discharged from the practice            Please let him know and I will do the letter ------

## 2012-07-09 NOTE — Telephone Encounter (Signed)
Left message for patient to call back  

## 2012-07-09 NOTE — Telephone Encounter (Signed)
CMA  AWARE

## 2012-07-10 ENCOUNTER — Encounter: Payer: Self-pay | Admitting: Internal Medicine

## 2012-07-10 NOTE — Telephone Encounter (Signed)
I have left another message, he does not return calls.  Did you create the letter?

## 2012-07-10 NOTE — Telephone Encounter (Signed)
Letter created and sent to medical records for dismissal

## 2012-07-11 ENCOUNTER — Telehealth: Payer: Self-pay | Admitting: Internal Medicine

## 2012-07-11 NOTE — Telephone Encounter (Signed)
Dismissal Letter sent by Certified Mail on 07/11/2012  Received the Return Receipt showing someone picked up the Dismissal Letter on 07/16/2012

## 2012-07-12 ENCOUNTER — Ambulatory Visit (INDEPENDENT_AMBULATORY_CARE_PROVIDER_SITE_OTHER): Payer: Self-pay | Admitting: Internal Medicine

## 2012-07-12 ENCOUNTER — Telehealth: Payer: Self-pay | Admitting: Internal Medicine

## 2012-07-12 ENCOUNTER — Encounter: Payer: Self-pay | Admitting: Internal Medicine

## 2012-07-12 VITALS — BP 135/88 | HR 83 | Temp 97.4°F | Ht 63.0 in | Wt 215.3 lb

## 2012-07-12 DIAGNOSIS — I10 Essential (primary) hypertension: Secondary | ICD-10-CM

## 2012-07-12 DIAGNOSIS — IMO0002 Reserved for concepts with insufficient information to code with codable children: Secondary | ICD-10-CM

## 2012-07-12 DIAGNOSIS — E1165 Type 2 diabetes mellitus with hyperglycemia: Secondary | ICD-10-CM

## 2012-07-12 DIAGNOSIS — IMO0001 Reserved for inherently not codable concepts without codable children: Secondary | ICD-10-CM

## 2012-07-12 LAB — GLUCOSE, CAPILLARY: Glucose-Capillary: 227 mg/dL — ABNORMAL HIGH (ref 70–99)

## 2012-07-12 NOTE — Progress Notes (Signed)
Case discussed with Dr. Sadek soon after the resident saw the patient.  We reviewed the resident's history and exam and pertinent patient test results.  I agree with the assessment, diagnosis, and plan of care documented in the resident's note. 

## 2012-07-12 NOTE — Patient Instructions (Signed)
Goals (1 Years of Data) as of 07/12/12         As of Today 06/27/12 06/14/12 05/31/12 05/22/12     Blood Pressure    . Blood Pressure < 140/90  135/88 108/68 119/78 131/81 116/71    . Blood Pressure < 140/90  135/88 108/68 119/78 131/81 116/71     Result Component    . HEMOGLOBIN A1C < 7.0     13.1     . HEMOGLOBIN A1C < 7.0     13.1     . LDL CALC < 100          . LDL CALC < 100           Visit was a pleasure to see you Mr. Schwenke. We continue to work on your diabetes management and will probably need to add an insulin once you're able to afford it we will talk about this at your next visit within a month. Continue to work on weight loss and smoking cessation those are very great achievements very proud of you.

## 2012-07-12 NOTE — Progress Notes (Signed)
Subjective:   Patient ID: Michael [name] male   DOB: [birth year] 57 y.o.   MRN: [id number]  HPI: [name] is a 57 y.o. poorly controlled diabetes type 2 insulin-dependent, chronic pancreatitis followed by Dr. Leone Payor, hypertension, MDD who presents for followup on diabetes management. Patient has been having some trouble affording all his medications and was not able to fill the nystatin prescription that was recommended given his elevated triglycerides the patient has continued on a statin, metformin,glipizide, and increase Lantus to 70 units. Patient feels that this has improved his CBG readings at home and patient has not noticed any hypoglycemic events. Patient is complaining of some right upper extremity tingling and muscle ache that is relieved with massage,cool compress, rest, and Bengay cream. This problem has been occurring for several months was not necessarily associated with any of his medications such as a statin. Patient doesn't remember any acute trauma to the extremity but has been leaning and sleeping on that arm. Patient continues to not drink alcohol and is continuing to work on smoking cessation.    Past Medical History  Diagnosis Date  . Chronic pancreatitis   . Smoker   . Gastritis due to alcohol without hemorrhage   . HTN (hypertension)   . Pancreatic lesion     appears innocent and related to chronic pancreatitis  . Heavy alcohol use     hx of  . Marijuana use     hx of  . Migraine   . Depression   . Helicobacter pylori gastritis 03/27/2011    on endoscopy, treated with omeprazole, clarithromycin and amoxicillin  . Insomnia   . ACE inhibitor-aggravated angioedema   . Sessile colonic polyp   . Diabetes mellitus   . GERD (gastroesophageal reflux disease)   . Chronic abdominal pain on narcotics 06/27/2012   Current Outpatient Prescriptions  Medication Sig Dispense Refill  . ARIPiprazole (ABILIFY) 15 MG tablet Take 22 mg by mouth at bedtime. Takes 15mg  tab, a  5mg  tab, and a 2mg  tab for 22mg  nightly      . aspirin 81 MG tablet Take 1 tablet (81 mg total) by mouth daily.  30 tablet  4  . FLUoxetine (PROZAC) 20 MG capsule Take 40 mg by mouth daily.       Marland Kitchen glipiZIDE (GLUCOTROL) 10 MG tablet Take 1 tablet (10 mg total) by mouth 2 (two) times daily before a meal.  60 tablet  2  . hydrochlorothiazide (HYDRODIURIL) 25 MG tablet Take 25 mg by mouth daily.      . hydrOXYzine (ATARAX/VISTARIL) 10 MG tablet Take 10 mg by mouth 3 (three) times daily as needed for itching.       . insulin glargine (LANTUS) 100 UNIT/ML injection Inject 0.7 mLs (70 Units total) into the skin at bedtime.  10 mL    . lovastatin (MEVACOR) 40 MG tablet Take 1 tablet (40 mg total) by mouth at bedtime.  30 tablet  2  . metFORMIN (GLUCOPHAGE) 1000 MG tablet Take 1 tablet (1,000 mg total) by mouth 2 (two) times daily with a meal.  60 tablet  2  . omeprazole (PRILOSEC) 20 MG capsule Take 20 mg by mouth daily.      Marland Kitchen oxyCODONE-acetaminophen (PERCOCET) 10-325 MG per tablet Take 1 tablet by mouth every 6 (six) hours as needed for pain.  75 tablet  0  . Pancrelipase, Lip-Prot-Amyl, 36000 UNITS CPEP Take 1 capsule by mouth 3 (three) times daily.       No  current facility-administered medications for this visit.   Family History  Problem Relation Age of Onset  . Colon cancer Neg Hx   . Stomach cancer Neg Hx   . Anesthesia problems Neg Hx   . Hypotension Neg Hx   . Pseudochol deficiency Neg Hx   . Malignant hyperthermia Neg Hx   . Hypertension Sister   . Diabetes Sister   . Heart disease Sister    History   Social History  . Marital Status: Single    Spouse Name: N/A    Number of Children: 0  . Years of Education: 11   Occupational History  .    [employer]    worked for 6 years  . 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 Smoking status: Current Every Day Smoker -- 0.20 packs/day for 30 years    Types: Cigarettes   . Smokeless tobacco: Never Used     Comment: 4 -5 cigs/day.Wants to quit.  . Alcohol Use: No     Comment: sobriety since 2/13,was drinking 7-10 beer/daily  . Drug Use: None     Comment: Feb. 2013 last use  . Sexually Active: None   Other Topics Concern  . None   Social History Narrative   Has been living at 3 different friend's homes since discharge 01/2011. Was living in the shelter prior to admission. Had previously lived with his niece and other family members but he has gotten kicked out each time.    Review of Systems: Constitutional: Denies fever, chills, diaphoresis, appetite change and fatigue.  HEENT: Denies photophobia, eye pain, redness, hearing loss, ear pain, congestion, sore throat, rhinorrhea, sneezing, mouth sores, trouble swallowing, neck pain, neck stiffness and tinnitus.   Respiratory: Denies SOB, DOE, cough, chest tightness,  and wheezing.   Cardiovascular: Denies chest pain, palpitations and leg swelling.  Gastrointestinal: Denies nausea, vomiting, abdominal pain, diarrhea, constipation, blood in stool and abdominal distention.  Genitourinary: Denies dysuria, urgency, frequency, hematuria, flank pain and difficulty urinating.  Endocrine: Denies: hot or cold intolerance, sweats, changes in hair or nails, polyuria, polydipsia. Musculoskeletal: Denies myalgias, back pain, joint swelling, arthralgias and gait problem.  Skin: Denies pallor, rash and wound.  Neurological: Denies dizziness, seizures, syncope, weakness, light-headedness, numbness and headaches.  Hematological: Denies adenopathy. Easy bruising, personal or family bleeding history  Psychiatric/Behavioral: Denies suicidal ideation, mood changes, confusion, nervousness, sleep disturbance and agitation  Objective:  Physical Exam: Filed Vitals:   07/12/12 1320  BP: 135/88  Pulse: 83  Temp: 97.4 F (36.3 C)  TempSrc: Oral  Height: 5\' 3"  (1.6 m)  Weight: 215 lb 4.8 oz (97.659 kg)  SpO2: 99%    Constitutional: Vital signs reviewed.  Patient is a well-developed and well-nourished in no acute distress and cooperative with exam. Alert and oriented x3.  Head: Normocephalic and atraumatic Ear: TM normal bilaterally Nose: No erythema or drainage noted.  Turbinates normal Mouth: no erythema or exudates, MMM Eyes: PERRL, EOMI, conjunctivae normal, No scleral icterus.  Neck: Supple, Trachea midline normal ROM, No JVD, mass, thyromegaly, or carotid bruit present.  Cardiovascular: RRR, S1 normal, S2 normal, no MRG, pulses symmetric and intact bilaterally Pulmonary/Chest: normal respiratory effort, CTAB, no wheezes, rales, or rhonchi Abdominal: Soft. Non-tender, non-distended, bowel sounds are normal, no masses, organomegaly, or guarding present.  GU: no CVA tenderness Musculoskeletal: No joint deformities, erythema, or stiffness, ROM full and no nontender Hematology: no  cervical, inginal, or axillary adenopathy.  Neurological: A&O x3, Strength is normal and symmetric bilaterally, cranial nerve II-XII are grossly intact, no focal motor deficit, sensory intact to light touch bilaterally.  Skin: Warm, dry and intact. No rash, cyanosis, or clubbing.  Psychiatric: Normal mood and affect. speech and behavior is normal. Judgment and thought content normal. Cognition and memory are normal.   Assessment & Plan:  1. DM: patient reports compliance with glipizide, metformin and Lantus to 70 units at bedtime. Patient brings in his glucometer today showing readings preprandial of 300-400s and most during the day of 200-500s. Patient has had retinal examination, foot exam, recent lipid panel, and is at goal for hypertension. Pt continues to have very poorly managed DM despite several improvements and had once achieved good control. NG his diabetes is a great challenge given his health literacy inability to follow complicated sliding scales. The pt also cant afford any medication at this time and therefore will  not be able to start anything new at this time. Patient does seem to be achieving better glycemic control since increasing the frequency of his visits to the clinic and helping optimize his social support. Therefore at his followup would consider increasing his Lantus if he continues to have very poor glycemic control.  -cont and f/u in 1 month -consider increasing Lantus at next visit or transitioning to Humulin R. And NPH this may be very difficult given patient's previous inability to tolerate 70/30 given the complexity of administration  Patient was discussed with Dr. Meredith Pel

## 2012-07-12 NOTE — Telephone Encounter (Signed)
Left message for patient to call back  

## 2012-07-15 NOTE — Telephone Encounter (Signed)
Patient notified that he has been discharged from the practice for non-compliance with narcotics.  Drug screen from last week was negative.

## 2012-07-18 ENCOUNTER — Telehealth: Payer: Self-pay | Admitting: Internal Medicine

## 2012-07-18 ENCOUNTER — Telehealth: Payer: Self-pay | Admitting: Licensed Clinical Social Worker

## 2012-07-18 ENCOUNTER — Telehealth: Payer: Self-pay | Admitting: Dietician

## 2012-07-18 NOTE — Telephone Encounter (Signed)
I have explained again to the patient that he was discharged for narcotics misuse.

## 2012-07-18 NOTE — Telephone Encounter (Signed)
Needs pen needles - will be able to buy some ion about 2 weeks. Will leave samples at front desk for patient.  He agrees that it would help him to use an insulin that comes with pen needles such as Levemir.

## 2012-07-18 NOTE — Telephone Encounter (Signed)
Michael [name] placed call to CSW to inform CSW that he needs a new gastroenterologist.  Pt received letter from current provider that pt will need to obtain a new specialist for care.  Pt states he attempted to call physicians on his own but none accept the [facility] card.  CSW informed Michael [name] that their is a specific process for [facility] Card referrals and it needs to be initiated by physician and nursing staff.  CSW will forward this information to pt's PCP.  CSW encouraged pt to renew his [facility] Card immediately as no physicians will accept the referral without having a current [facility] card.  CSW suggested pt's sister request notary from her current bank, as banks generally offer free notary for current customers.  Pt denies add'l needs at this time.

## 2012-07-22 ENCOUNTER — Other Ambulatory Visit: Payer: Self-pay | Admitting: *Deleted

## 2012-07-22 ENCOUNTER — Emergency Department (HOSPITAL_COMMUNITY)
Admission: EM | Admit: 2012-07-22 | Discharge: 2012-07-22 | Disposition: A | Discharge status: Home / Self Care | Payer: Self-pay | Attending: Emergency Medicine | Admitting: Emergency Medicine

## 2012-07-22 ENCOUNTER — Encounter (HOSPITAL_COMMUNITY): Payer: Self-pay | Admitting: Emergency Medicine

## 2012-07-22 ENCOUNTER — Encounter: Payer: Self-pay | Admitting: Licensed Clinical Social Worker

## 2012-07-22 DIAGNOSIS — E119 Type 2 diabetes mellitus without complications: Secondary | ICD-10-CM | POA: Insufficient documentation

## 2012-07-22 DIAGNOSIS — K861 Other chronic pancreatitis: Secondary | ICD-10-CM | POA: Insufficient documentation

## 2012-07-22 DIAGNOSIS — IMO0002 Reserved for concepts with insufficient information to code with codable children: Secondary | ICD-10-CM

## 2012-07-22 DIAGNOSIS — G8929 Other chronic pain: Secondary | ICD-10-CM | POA: Insufficient documentation

## 2012-07-22 DIAGNOSIS — E1165 Type 2 diabetes mellitus with hyperglycemia: Secondary | ICD-10-CM

## 2012-07-22 DIAGNOSIS — K2960 Other gastritis without bleeding: Secondary | ICD-10-CM | POA: Insufficient documentation

## 2012-07-22 DIAGNOSIS — I10 Essential (primary) hypertension: Secondary | ICD-10-CM | POA: Insufficient documentation

## 2012-07-22 DIAGNOSIS — K219 Gastro-esophageal reflux disease without esophagitis: Secondary | ICD-10-CM | POA: Insufficient documentation

## 2012-07-22 DIAGNOSIS — Z8719 Personal history of other diseases of the digestive system: Secondary | ICD-10-CM | POA: Insufficient documentation

## 2012-07-22 DIAGNOSIS — A048 Other specified bacterial intestinal infections: Secondary | ICD-10-CM | POA: Insufficient documentation

## 2012-07-22 DIAGNOSIS — Z794 Long term (current) use of insulin: Secondary | ICD-10-CM | POA: Insufficient documentation

## 2012-07-22 DIAGNOSIS — F3289 Other specified depressive episodes: Secondary | ICD-10-CM | POA: Insufficient documentation

## 2012-07-22 DIAGNOSIS — F17200 Nicotine dependence, unspecified, uncomplicated: Secondary | ICD-10-CM | POA: Insufficient documentation

## 2012-07-22 DIAGNOSIS — F329 Major depressive disorder, single episode, unspecified: Secondary | ICD-10-CM | POA: Insufficient documentation

## 2012-07-22 DIAGNOSIS — Z860100 Personal history of colon polyps, unspecified: Secondary | ICD-10-CM | POA: Insufficient documentation

## 2012-07-22 DIAGNOSIS — Z79899 Other long term (current) drug therapy: Secondary | ICD-10-CM | POA: Insufficient documentation

## 2012-07-22 DIAGNOSIS — R109 Unspecified abdominal pain: Secondary | ICD-10-CM | POA: Insufficient documentation

## 2012-07-22 DIAGNOSIS — Z8679 Personal history of other diseases of the circulatory system: Secondary | ICD-10-CM | POA: Insufficient documentation

## 2012-07-22 LAB — CBC
HCT: 45.6 % (ref 39.0–52.0)
MCH: 32.9 pg (ref 26.0–34.0)
MCHC: 36 g/dL (ref 30.0–36.0)
RDW: 13.1 % (ref 11.5–15.5)

## 2012-07-22 LAB — COMPREHENSIVE METABOLIC PANEL
ALT: 27 U/L (ref 0–53)
Calcium: 8.8 mg/dL (ref 8.4–10.5)
Creatinine, Ser: 0.64 mg/dL (ref 0.50–1.35)
GFR calc Af Amer: >90 mL/min (ref >90)
GFR calc non Af Amer: >90 mL/min (ref >90)
Glucose, Bld: 293 mg/dL — ABNORMAL HIGH (ref 70–99)
Sodium: 134 mEq/L — ABNORMAL LOW (ref 135–145)
Total Protein: 7.5 g/dL (ref 6.0–8.3)

## 2012-07-22 LAB — GLUCOSE, CAPILLARY: Glucose-Capillary: 321 mg/dL — ABNORMAL HIGH (ref 70–99)

## 2012-07-22 MED ORDER — HYDROCODONE-ACETAMINOPHEN 5-325 MG PO TABS: 1.0000 | ORAL_TABLET | Freq: Four times a day (QID) | ORAL | Status: DC | PRN

## 2012-07-22 MED ORDER — FAMOTIDINE 20 MG PO TABS
20.0000 mg | ORAL_TABLET | Freq: Once | ORAL | Status: AC
  Administered 2012-07-22: 20 mg via ORAL
  Filled 2012-07-22: qty 1

## 2012-07-22 MED ORDER — PANTOPRAZOLE SODIUM 40 MG PO TBEC: 40.0000 mg | DELAYED_RELEASE_TABLET | Freq: Every day | ORAL | Status: DC

## 2012-07-22 MED ORDER — HYDROCODONE-ACETAMINOPHEN 5-325 MG PO TABS
2.0000 | ORAL_TABLET | Freq: Once | ORAL | Status: AC
  Administered 2012-07-22: 2 via ORAL
  Filled 2012-07-22: qty 2

## 2012-07-22 NOTE — ED Provider Notes (Addendum)
History    CSN: [id number] Arrival date & time 07/22/12  0957  First MD Initiated Contact with Patient 07/22/12 1015     Chief Complaint  Patient presents with  . Abdominal Pain   (Consider location/radiation/quality/duration/timing/severity/associated sxs/prior Treatment) Patient is a 57 y.o. male presenting with abdominal pain. The history is provided by the patient.  Abdominal Pain Associated symptoms include abdominal pain. Pertinent negatives include no chest pain, no headaches and no shortness of breath.  pt c/o epigastric pain for past few days. Constant. Dull. Non radiating. No specific exacerbating or alleviating factors. States it is the same pain he has had chronically in past related to chronic pancreatitis. No hx pud or gallstones. No back or flank pain. No mid to lower abd pain. No distension. No nv. Having normal bms. No cp or sob. No cough or uri c/o. States currently out of pain meds.    Past Medical History  Diagnosis Date  . Chronic pancreatitis   . Smoker   . Gastritis due to alcohol without hemorrhage   . HTN (hypertension)   . Pancreatic lesion     appears innocent and related to chronic pancreatitis  . Heavy alcohol use     hx of  . Marijuana use     hx of  . Migraine   . Depression   . Helicobacter pylori gastritis 03/27/2011    on endoscopy, treated with omeprazole, clarithromycin and amoxicillin  . Insomnia   . ACE inhibitor-aggravated angioedema   . Sessile colonic polyp   . Diabetes mellitus   . GERD (gastroesophageal reflux disease)   . Chronic abdominal pain on narcotics 06/27/2012   Past Surgical History  Procedure Laterality Date  . Esophagogastroduodenoscopy  03/22/2011    Procedure: ESOPHAGOGASTRODUODENOSCOPY (EGD);  Surgeon: [name], MD;  Location: [facility];  Service: Endoscopy;  Laterality: N/A;  egd with balloon   . Balloon dilation  03/22/2011    Procedure: BALLOON DILATION;  Surgeon: [name], MD;  Location: [facility]  ENDOSCOPY;  Service: Endoscopy;  Laterality: N/A;  . Eus  04/20/2011    Procedure: UPPER ENDOSCOPIC ULTRASOUND (EUS) LINEAR;  Surgeon: Rachael Fee, MD;  Location: [facility] ENDOSCOPY;  Service: Endoscopy;  Laterality: N/A;  . Colonoscopy w/ biopsies and polypectomy  09/12/11  . Colonoscopy N/A 05/22/2012    Procedure: COLONOSCOPY;  Surgeon: [name], MD;  Location: [facility] ENDOSCOPY;  Service: Endoscopy;  Laterality: N/A;   Family History  Problem Relation Age of Onset  . Colon cancer Neg Hx   . Stomach cancer Neg Hx   . Anesthesia problems Neg Hx   . Hypotension Neg Hx   . Pseudochol deficiency Neg Hx   . Malignant hyperthermia Neg Hx   . Hypertension Sister   . Diabetes Sister   . Heart disease Sister    History  Substance Use Topics  . Smoking status: Current Every Day Smoker -- 0.20 packs/day for 30 years    Types: Cigarettes  . Smokeless tobacco: Never Used     Comment: 4 -5 cigs/day.Wants to quit.  . Alcohol Use: No     Comment: sobriety since 2/13,was drinking 7-10 beer/daily    Review of Systems  Constitutional: Negative for fever and chills.  HENT: Negative for neck pain.   Eyes: Negative for redness.  Respiratory: Negative for cough and shortness of breath.   Cardiovascular: Negative for chest pain.  Gastrointestinal: Positive for abdominal pain. Negative for vomiting, diarrhea and constipation.  Genitourinary: Negative for  dysuria and flank pain.  Musculoskeletal: Negative for back pain.  Skin: Negative for rash.  Neurological: Negative for headaches.  Hematological: Does not bruise/bleed easily.  Psychiatric/Behavioral: Negative for confusion.    Allergies  Ace inhibitors and Losartan  Home Medications   Current Outpatient Rx  Name  Route  Sig  Dispense  Refill  . ARIPiprazole (ABILIFY) 15 MG tablet   Oral   Take 22 mg by mouth at bedtime. Takes 15mg  tab, a 5mg  tab, and a 2mg  tab for 22mg  nightly         . aspirin 81 MG tablet   Oral   Take 1 tablet  (81 mg total) by mouth daily.   30 tablet   4   . FLUoxetine (PROZAC) 20 MG capsule   Oral   Take 40 mg by mouth daily.          Marland Kitchen glipiZIDE (GLUCOTROL) 10 MG tablet   Oral   Take 1 tablet (10 mg total) by mouth 2 (two) times daily before a meal.   60 tablet   2   . hydrochlorothiazide (HYDRODIURIL) 25 MG tablet   Oral   Take 25 mg by mouth daily.         . hydrOXYzine (ATARAX/VISTARIL) 10 MG tablet   Oral   Take 10 mg by mouth 3 (three) times daily as needed for itching.          . insulin glargine (LANTUS) 100 UNIT/ML injection   Subcutaneous   Inject 0.7 mLs (70 Units total) into the skin at bedtime.   10 mL      . lovastatin (MEVACOR) 40 MG tablet   Oral   Take 1 tablet (40 mg total) by mouth at bedtime.   30 tablet   2   . metFORMIN (GLUCOPHAGE) 1000 MG tablet   Oral   Take 1 tablet (1,000 mg total) by mouth 2 (two) times daily with a meal.   60 tablet   2   . omeprazole (PRILOSEC) 20 MG capsule   Oral   Take 20 mg by mouth daily.         . Pancrelipase, Lip-Prot-Amyl, 36000 UNITS CPEP   Oral   Take 1 capsule by mouth 3 (three) times daily.          BP 139/91  Pulse 91  Temp(Src) 98.1 F (36.7 C) (Oral)  Resp 20  SpO2 99% Physical Exam  Nursing note and vitals reviewed. Constitutional: He is oriented to person, place, and time. He appears well-developed and well-nourished. No distress.  HENT:  Head: Atraumatic.  Eyes: Conjunctivae are normal. No scleral icterus.  Neck: Neck supple. No tracheal deviation present.  Cardiovascular: Normal rate, regular rhythm, normal heart sounds and intact distal pulses.   Pulmonary/Chest: Effort normal and breath sounds normal. No accessory muscle usage. No respiratory distress.  Abdominal: Soft. Bowel sounds are normal. He exhibits no distension and no mass. There is tenderness. There is no rebound and no guarding.  Mild epigastric tenderness.   Musculoskeletal: Normal range of motion. He exhibits no  edema and no tenderness.  Neurological: He is alert and oriented to person, place, and time.  Skin: Skin is warm and dry.  Psychiatric: He has a normal mood and affect.    ED Course  Procedures (including critical care time)  Results for orders placed during the hospital encounter of 07/22/12  CBC      Result Value Range   WBC 9.9  4.0 - 10.5  K/uL   RBC 4.98  4.22 - 5.81 MIL/uL   Hemoglobin 16.4  13.0 - 17.0 g/dL   HCT 16.1  09.6 - 04.5 %   MCV 91.6  78.0 - 100.0 fL   MCH 32.9  26.0 - 34.0 pg   MCHC 36.0  30.0 - 36.0 g/dL   RDW 40.9  81.1 - 91.4 %   Platelets 198  150 - 400 K/uL  GLUCOSE, CAPILLARY      Result Value Range   Glucose-Capillary 321 (*) 70 - 99 mg/dL  COMPREHENSIVE METABOLIC PANEL      Result Value Range   Sodium 134 (*) 135 - 145 mEq/L   Potassium 5.3 (*) 3.5 - 5.1 mEq/L   Chloride 98  96 - 112 mEq/L   CO2 24  19 - 32 mEq/L   Glucose, Bld 293 (*) 70 - 99 mg/dL   BUN 14  6 - 23 mg/dL   Creatinine, Ser 7.82  0.50 - 1.35 mg/dL   Calcium 8.8  8.4 - 95.6 mg/dL   Total Protein 7.5  6.0 - 8.3 g/dL   Albumin 3.6  3.5 - 5.2 g/dL   AST 37  0 - 37 U/L   ALT 27  0 - 53 U/L   Alkaline Phosphatase 138 (*) 39 - 117 U/L   Total Bilirubin 0.3  0.3 - 1.2 mg/dL   GFR calc non Af Amer >90  >90 mL/min   GFR calc Af Amer >90  >90 mL/min  LIPASE, BLOOD      Result Value Range   Lipase 95 (*) 11 - 59 U/L        MDM  Pt requests pain med. vicodin 2 po, pepcid po.  Reviewed nursing notes and prior charts for additional history.   Pt with hx chr pancreatitis, pt states he has daily pain and requests pain rx, denies any abrupt change in or worsening of symptoms, but states his pcp is working on new gi md as was recently dismissed from former [employer].  1200 recheck chemistries/lab still pending - discussed w pt, explained delay in lab.   Recheck abd soft nt. No nv.   Tolerating po fluids.  Pt stable for d/c.   Suzi Roots, MD 07/22/12 1203  Suzi Roots,  MD 07/22/12 [id number]

## 2012-07-22 NOTE — ED Notes (Signed)
Pt states he has a hx of pancreatitis, pt complains of URQ abdominal pain, pt states N/V on Saturday. Pt states he is having lower left back pain as well. Pt states his last BM was this morning. Pt denies any problems urinating.

## 2012-07-22 NOTE — ED Notes (Signed)
Informed CMP and lipase hemolyzed, phlebotomy informed.

## 2012-07-22 NOTE — Progress Notes (Addendum)
Michael [name] presents today at [facility] to meet with CSW.  Pt states he is still in need of a gastroenterologist.  CSW reminded pt, his main goal should be to renew his [facility] as no referrals can be made without an [employer].  In addition, CSW informed Michael [name], he currently is receiving his pain medication from Dr. Leone Payor and [facility] is not a controlled medication prescriber for him based on FYI note 10/13.  Pt aware he will need to renew his [facility] card to receive any new referrals.  CSW discussed options of obtaining notary service.  Pt states he only has 3 days left of his glipizide and asking CSW for refill to be called in to [facility] pharmacy.  CSW informed Michael [name] for future refill request to call the main number and follow prompts for refill line.  CSW informed Refill RN today of pt's request for refill for glipizide to be sent to [facility], cost is $10.  Pt states he continues to work on housing options through the [employer] and Michael [name].  Pt acknowledging he needs his own place as it will help his social life.  CSW provided Michael [name] with $1.00 from transportation funds to assist with transportation today.  CSW attempt to contact Michael [name] to inform pt refill was sent to [facility], pt currently in ED.

## 2012-07-22 NOTE — Telephone Encounter (Signed)
Pt is changing pharmacies

## 2012-07-23 MED ORDER — GLIPIZIDE 10 MG PO TABS: 10.0000 mg | ORAL_TABLET | Freq: Two times a day (BID) | ORAL | Status: DC

## 2012-07-24 NOTE — Telephone Encounter (Signed)
Called to [facility] 252 [phone]

## 2012-08-01 ENCOUNTER — Telehealth: Payer: Self-pay | Admitting: Internal Medicine

## 2012-08-07 ENCOUNTER — Encounter (HOSPITAL_COMMUNITY): Payer: Self-pay | Admitting: Nurse Practitioner

## 2012-08-07 ENCOUNTER — Emergency Department (HOSPITAL_COMMUNITY)
Admission: EM | Admit: 2012-08-07 | Discharge: 2012-08-07 | Disposition: A | Discharge status: Home / Self Care | Payer: Self-pay | Attending: Emergency Medicine | Admitting: Emergency Medicine

## 2012-08-07 DIAGNOSIS — Z860100 Personal history of colon polyps, unspecified: Secondary | ICD-10-CM | POA: Insufficient documentation

## 2012-08-07 DIAGNOSIS — E119 Type 2 diabetes mellitus without complications: Secondary | ICD-10-CM | POA: Insufficient documentation

## 2012-08-07 DIAGNOSIS — R109 Unspecified abdominal pain: Secondary | ICD-10-CM | POA: Insufficient documentation

## 2012-08-07 DIAGNOSIS — I10 Essential (primary) hypertension: Secondary | ICD-10-CM | POA: Insufficient documentation

## 2012-08-07 DIAGNOSIS — G8929 Other chronic pain: Secondary | ICD-10-CM | POA: Insufficient documentation

## 2012-08-07 DIAGNOSIS — Z760 Encounter for issue of repeat prescription: Secondary | ICD-10-CM | POA: Insufficient documentation

## 2012-08-07 DIAGNOSIS — F17200 Nicotine dependence, unspecified, uncomplicated: Secondary | ICD-10-CM | POA: Insufficient documentation

## 2012-08-07 DIAGNOSIS — T426X5A Adverse effect of other antiepileptic and sedative-hypnotic drugs, initial encounter: Secondary | ICD-10-CM | POA: Insufficient documentation

## 2012-08-07 DIAGNOSIS — Z7982 Long term (current) use of aspirin: Secondary | ICD-10-CM | POA: Insufficient documentation

## 2012-08-07 DIAGNOSIS — G4700 Insomnia, unspecified: Secondary | ICD-10-CM | POA: Insufficient documentation

## 2012-08-07 DIAGNOSIS — F3289 Other specified depressive episodes: Secondary | ICD-10-CM | POA: Insufficient documentation

## 2012-08-07 DIAGNOSIS — K861 Other chronic pancreatitis: Secondary | ICD-10-CM | POA: Insufficient documentation

## 2012-08-07 DIAGNOSIS — F329 Major depressive disorder, single episode, unspecified: Secondary | ICD-10-CM | POA: Insufficient documentation

## 2012-08-07 DIAGNOSIS — Z8719 Personal history of other diseases of the digestive system: Secondary | ICD-10-CM | POA: Insufficient documentation

## 2012-08-07 DIAGNOSIS — Z794 Long term (current) use of insulin: Secondary | ICD-10-CM | POA: Insufficient documentation

## 2012-08-07 DIAGNOSIS — Z8679 Personal history of other diseases of the circulatory system: Secondary | ICD-10-CM | POA: Insufficient documentation

## 2012-08-07 DIAGNOSIS — Z79899 Other long term (current) drug therapy: Secondary | ICD-10-CM | POA: Insufficient documentation

## 2012-08-07 DIAGNOSIS — Z8619 Personal history of other infectious and parasitic diseases: Secondary | ICD-10-CM | POA: Insufficient documentation

## 2012-08-07 MED ORDER — PANTOPRAZOLE SODIUM 40 MG PO TBEC: 40.0000 mg | DELAYED_RELEASE_TABLET | Freq: Every day | ORAL | Status: DC

## 2012-08-07 MED ORDER — OXYCODONE-ACETAMINOPHEN 10-325 MG PO TABS: 1.0000 | ORAL_TABLET | ORAL | Status: DC | PRN

## 2012-08-07 NOTE — ED Provider Notes (Signed)
Medical screening examination/treatment/procedure(s) were performed by non-physician practitioner and as supervising physician I was immediately available for consultation/collaboration.   Miguel Christiana, MD 08/07/12 1511 

## 2012-08-07 NOTE — ED Notes (Signed)
Pt here for Percocet. Has unfilled rx's for Vicodin and Protonix from 6/30. States he has had that before and it doesn't work. Has appointment with [facility] 7/21. Has receipt for Percocet from 05/2012. "I have chronic pancreatitis".

## 2012-08-07 NOTE — ED Notes (Signed)
Pt reports he has chronic pancreatitis, normally takes percocet for pain with relief. He was seen here on 6/30 for abd pain and given Rx for vicodin, which he never got filled "because it doesn't work." today he wants to get a refill on the percocet.

## 2012-08-07 NOTE — ED Provider Notes (Signed)
History    CSN: [id number] Arrival date & time 08/07/12  1131  First MD Initiated Contact with Patient 08/07/12 1231     Chief Complaint  Patient presents with  . Medication Refill   (Consider location/radiation/quality/duration/timing/severity/associated sxs/prior Treatment) HPI  GARON MELANDER is a(n) 57 y.o. male who presents with a request to change medication course.  The patient has a history of chronic pancreatitis and left arm neuropathy.  He was seen here several days ago and given Norco.  Patient states that Norco makes him sick to his stomach.  He states that he is able to do very well with Percocet.  The patient is being followed by his primary care doctor and GI specialist.  He denies any nausea, vomiting, diarrhea, melena or hematochezia.  Denies fevers, chills, myalgias, arthralgias. Denies DOE, SOB, chest tightness or pressure, radiation to left arm, jaw or back, or diaphoresis. Denies dysuria, flank pain, suprapubic pain, frequency, urgency, or hematuria. Denies headaches, light headedness, weakness, visual disturbances.  Past Medical History  Diagnosis Date  . Chronic pancreatitis   . Smoker   . Gastritis due to alcohol without hemorrhage   . HTN (hypertension)   . Pancreatic lesion     appears innocent and related to chronic pancreatitis  . Heavy alcohol use     hx of  . Marijuana use     hx of  . Migraine   . Depression   . Helicobacter pylori gastritis 03/27/2011    on endoscopy, treated with omeprazole, clarithromycin and amoxicillin  . Insomnia   . ACE inhibitor-aggravated angioedema   . Sessile colonic polyp   . Diabetes mellitus   . GERD (gastroesophageal reflux disease)   . Chronic abdominal pain on narcotics 06/27/2012   Past Surgical History  Procedure Laterality Date  . Esophagogastroduodenoscopy  03/22/2011    Procedure: ESOPHAGOGASTRODUODENOSCOPY (EGD);  Surgeon: [name], MD;  Location: [facility] END[facility]COPY;  Service: Endoscopy;  Laterality: N/A;   egd with balloon   . Balloon dilation  03/22/2011    Procedure: BALLOON DILATION;  Surgeon: [name], MD;  Location: [facility] ENDOSCOPY;  Service: Endoscopy;  Laterality: N/A;  . Eus  04/20/2011    Procedure: UPPER ENDOSCOPIC ULTRASOUND (EUS) LINEAR;  Surgeon: Rachael Fee, MD;  Location: [facility] ENDOSCOPY;  Service: Endoscopy;  Laterality: N/A;  . Colonoscopy w/ biopsies and polypectomy  09/12/11  . Colonoscopy N/A 05/22/2012    Procedure: COLONOSCOPY;  Surgeon: [name], MD;  Location: [facility] ENDOSCOPY;  Service: Endoscopy;  Laterality: N/A;   Family History  Problem Relation Age of Onset  . Colon cancer Neg Hx   . Stomach cancer Neg Hx   . Anesthesia problems Neg Hx   . Hypotension Neg Hx   . Pseudochol deficiency Neg Hx   . Malignant hyperthermia Neg Hx   . Hypertension Sister   . Diabetes Sister   . Heart disease Sister    History  Substance Use Topics  . Smoking status: Current Every Day Smoker -- 0.20 packs/day for 30 years    Types: Cigarettes  . Smokeless tobacco: Never Used     Comment: 4 -5 cigs/day.Wants to quit.  . Alcohol Use: No     Comment: sobriety since 2/13,was drinking 7-10 beer/daily    Review of Systems Ten systems reviewed and are negative for acute change, except as noted in the HPI.   Allergies  Ace inhibitors and Losartan  Home Medications   Current Outpatient Rx  Name  Route  Sig  Dispense  Refill  . ARIPiprazole (ABILIFY) 15 MG tablet   Oral   Take 22 mg by mouth at bedtime. Takes 15mg  tab, a 5mg  tab, and a 2mg  tab for 22mg  nightly         . FLUoxetine (PROZAC) 20 MG capsule   Oral   Take 40 mg by mouth daily.          Marland Kitchen glipiZIDE (GLUCOTROL) 10 MG tablet   Oral   Take 10 mg by mouth 2 (two) times daily before a meal.         . hydrochlorothiazide (HYDRODIURIL) 25 MG tablet   Oral   Take 25 mg by mouth daily.         . hydrOXYzine (ATARAX/VISTARIL) 10 MG tablet   Oral   Take 10 mg by mouth 3 (three) times daily as needed for  itching.          . insulin glargine (LANTUS) 100 UNIT/ML injection   Subcutaneous   Inject 0.7 mLs (70 Units total) into the skin at bedtime.   10 mL      . lovastatin (MEVACOR) 40 MG tablet   Oral   Take 1 tablet (40 mg total) by mouth at bedtime.   30 tablet   2   . metFORMIN (GLUCOPHAGE) 1000 MG tablet   Oral   Take 1 tablet (1,000 mg total) by mouth 2 (two) times daily with a meal.   60 tablet   2   . omeprazole (PRILOSEC) 20 MG capsule   Oral   Take 20 mg by mouth daily.         . Pancrelipase, Lip-Prot-Amyl, 36000 UNITS CPEP   Oral   Take 1 capsule by mouth 3 (three) times daily.         Marland Kitchen aspirin 81 MG tablet   Oral   Take 1 tablet (81 mg total) by mouth daily.   30 tablet   4   . HYDROcodone-acetaminophen (NORCO/VICODIN) 5-325 MG per tablet   Oral   Take 1-2 tablets by mouth every 6 (six) hours as needed for pain.   20 tablet   0   . pantoprazole (PROTONIX) 40 MG tablet   Oral   Take 1 tablet (40 mg total) by mouth daily.   20 tablet   0    BP 145/103  Pulse 110  Temp(Src) 98.1 F (36.7 C) (Oral)  Resp 20  SpO2 98% Physical Exam Physical Exam  Nursing note and vitals reviewed. Constitutional: He appears well-developed and well-nourished. No distress.  HENT:  Head: Normocephalic and atraumatic.  Eyes: Conjunctivae normal are normal. No scleral icterus.  Neck: Normal range of motion. Neck supple.  Cardiovascular: Normal rate, regular rhythm and normal heart sounds.   Pulmonary/Chest: Effort normal and breath sounds normal. No respiratory distress.  Abdominal: Soft.  Tender to palpation left upper quadrant  Musculoskeletal: He exhibits no edema.  Neurological: He is alert.  Skin: Skin is warm and dry. He is not diaphoretic.  Psychiatric: His behavior is normal.    ED Course  Procedures (including critical care time) Labs Reviewed - No data to display No results found. 1. Medication course changed   2. Chronic abdominal pain on  narcotics     MDM  1:22 PM Patient requests change in his medication.  I discharged the patient with Percocet.  He handed me his prescriptions given the other day.  And they were shredded.  He will followup with  his primary care physician's. The patient appears reasonably screened and/or stabilized for discharge and I doubt any other medical condition or other [facility] requiring further screening, evaluation, or treatment in the ED at this time prior to discharge.   Arthor Captain, PA-C 08/07/12 1323

## 2012-08-12 ENCOUNTER — Ambulatory Visit (INDEPENDENT_AMBULATORY_CARE_PROVIDER_SITE_OTHER): Payer: Self-pay | Admitting: Internal Medicine

## 2012-08-12 ENCOUNTER — Encounter: Payer: Self-pay | Admitting: Internal Medicine

## 2012-08-12 VITALS — BP 124/86 | HR 87 | Temp 97.4°F | Ht 63.0 in | Wt 218.0 lb

## 2012-08-12 DIAGNOSIS — E1165 Type 2 diabetes mellitus with hyperglycemia: Secondary | ICD-10-CM

## 2012-08-12 DIAGNOSIS — R109 Unspecified abdominal pain: Secondary | ICD-10-CM

## 2012-08-12 DIAGNOSIS — IMO0002 Reserved for concepts with insufficient information to code with codable children: Secondary | ICD-10-CM

## 2012-08-12 DIAGNOSIS — I10 Essential (primary) hypertension: Secondary | ICD-10-CM

## 2012-08-12 DIAGNOSIS — IMO0001 Reserved for inherently not codable concepts without codable children: Secondary | ICD-10-CM

## 2012-08-12 DIAGNOSIS — G8929 Other chronic pain: Secondary | ICD-10-CM

## 2012-08-12 LAB — GLUCOSE, CAPILLARY: Glucose-Capillary: 592 mg/dL (ref 70–99)

## 2012-08-12 NOTE — Assessment & Plan Note (Signed)
Assessment:  The patient has chronic abdominal pain thought to be 2/2 chronic pancreatitis.  Plan:  The patient would like to be referred to Pain Management clinic. The patient plans to complete his paperwork for his orange card before being referred. We plan to refer the patient once he is complete.

## 2012-08-12 NOTE — Assessment & Plan Note (Addendum)
BP Readings from Last 3 Encounters:  08/12/12 124/86  08/07/12 154/97  07/22/12 124/84    Lab Results  Component Value Date   NA 134* 07/22/2012   K 5.3* 07/22/2012   CREATININE 0.64 07/22/2012    Assessment: Blood pressure control: controlled Progress toward BP goal:  improved  Plan: Medications:  continue current medications Educational resources provided: handout Other plans: Follow up in 3 weeks with PCP.

## 2012-08-12 NOTE — Progress Notes (Signed)
Subjective:   Patient ID: Michael [name] male   DOB: 1955/04/14 57 y.o.   MRN: [id number]  HPI: [name] is a 57 y.o. man with a pmhx detailed below who comes to the clinic today for f/u of his DM II. The patient reports that he takes his insulin every night (70 units) and checks it three times a day. Furthermore, he is compliant with glipizide and metformin. The patient states that his numbers are all over the place 150's->500.  The patient denies feeling poorly, but observes his vision sometimes gets blurry. The patient denies symptoms of hypoglycemia.  The patient continues to have chronic abdominal pain. The pain is primarily in his LUQ and radiates to his back. The patient's pain is made better with oxycodone-APA (10/325) which was prescribed by an ED physician. Of note, the patient was under the care of Dr. Marrion Coy for his chronic pancreatitis until recently when the patient stopped see him. Since then, the patient has not had a physician, aside from the ED, that can prescribe him his pain medications. The patient asked to be referred to a pain clinic today.    Past Medical History  Diagnosis Date  . Chronic pancreatitis   . Smoker   . Gastritis due to alcohol without hemorrhage   . HTN (hypertension)   . Pancreatic lesion     appears innocent and related to chronic pancreatitis  . Heavy alcohol use     hx of  . Marijuana use     hx of  . Migraine   . Depression   . Helicobacter pylori gastritis 03/27/2011    on endoscopy, treated with omeprazole, clarithromycin and amoxicillin  . Insomnia   . ACE inhibitor-aggravated angioedema   . Sessile colonic polyp   . Diabetes mellitus   . GERD (gastroesophageal reflux disease)   . Chronic abdominal pain on narcotics 06/27/2012   Current Outpatient Prescriptions  Medication Sig Dispense Refill  . ARIPiprazole (ABILIFY) 15 MG tablet Take 22 mg by mouth at bedtime. Takes 15mg  tab, a 5mg  tab, and a 2mg  tab for 22mg  nightly      .  aspirin 81 MG tablet Take 1 tablet (81 mg total) by mouth daily.  30 tablet  4  . FLUoxetine (PROZAC) 20 MG capsule Take 40 mg by mouth daily.       Marland Kitchen glipiZIDE (GLUCOTROL) 10 MG tablet Take 10 mg by mouth 2 (two) times daily before a meal.      . hydrochlorothiazide (HYDRODIURIL) 25 MG tablet Take 25 mg by mouth daily.      . hydrOXYzine (ATARAX/VISTARIL) 10 MG tablet Take 10 mg by mouth 3 (three) times daily as needed for itching.       . insulin glargine (LANTUS) 100 UNIT/ML injection Inject 0.7 mLs (70 Units total) into the skin at bedtime.  10 mL    . lovastatin (MEVACOR) 40 MG tablet Take 1 tablet (40 mg total) by mouth at bedtime.  30 tablet  2  . metFORMIN (GLUCOPHAGE) 1000 MG tablet Take 1 tablet (1,000 mg total) by mouth 2 (two) times daily with a meal.  60 tablet  2  . omeprazole (PRILOSEC) 20 MG capsule Take 20 mg by mouth daily.      Marland Kitchen oxyCODONE-acetaminophen (PERCOCET) 10-325 MG per tablet Take 1 tablet by mouth every 4 (four) hours as needed for pain.  30 tablet  0  . Pancrelipase, Lip-Prot-Amyl, 36000 UNITS CPEP Take 1 capsule by mouth 3 (three)  times daily.      Marland Kitchen HYDROcodone-acetaminophen (NORCO/VICODIN) 5-325 MG per tablet Take 1-2 tablets by mouth every 6 (six) hours as needed for pain.  20 tablet  0  . pantoprazole (PROTONIX) 40 MG tablet Take 1 tablet (40 mg total) by mouth daily.  20 tablet  0   No current facility-administered medications for this visit.   Family History  Problem Relation Age of Onset  . Colon cancer Neg Hx   . Stomach cancer Neg Hx   . Anesthesia problems Neg Hx   . Hypotension Neg Hx   . Pseudochol deficiency Neg Hx   . Malignant hyperthermia Neg Hx   . Hypertension Sister   . Diabetes Sister   . Heart disease Sister    History   Social History  . Marital Status: Single    Spouse Name: N/A    Number of Children: 0  . Years of Education: 11   Occupational History  .    [employer]    worked for 6 years  . 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 Smoking status: Current Every Day Smoker -- 0.20 packs/day for 30 years    Types: Cigarettes  . Smokeless tobacco: Never Used     Comment: 4 -5 cigs/day.Wants to quit.  . Alcohol Use: No     Comment: sobriety since 2/13,was drinking 7-10 beer/daily  . Drug Use: None     Comment: Feb. 2013 last use  . Sexually Active: None   Other Topics Concern  . None   Social History Narrative   Has been living at 3 different friend's homes since discharge 01/2011. Was living in the shelter prior to admission. Had previously lived with his niece and other family members but he has gotten kicked out each time.    Review of Systems:  Review of Systems  Constitutional: Negative for fever and chills.  HENT: Negative for sore throat.   Eyes: Positive for blurred vision. Negative for double vision and photophobia.  Respiratory: Negative for cough, shortness of breath and wheezing.   Cardiovascular: Negative for chest pain, palpitations and leg swelling.  Gastrointestinal: Positive for heartburn and abdominal pain. Negative for nausea, vomiting, diarrhea, constipation, blood in stool and melena.  Genitourinary: Negative for dysuria, urgency and frequency.  Skin: Negative for itching and rash.  Neurological: Negative for headaches.     Objective:  Physical Exam: Filed Vitals:   08/12/12 1015  BP: 124/86  Pulse: 87  Temp: 97.4 F (36.3 C)  TempSrc: Oral  Height: 5\' 3"  (1.6 m)  Weight: 218 lb (98.884 kg)  SpO2: 99%   Physical Exam  Constitutional: He is oriented to person, place, and time. He appears well-developed and well-nourished.  HENT:  Head: Normocephalic and atraumatic.  Nose: Nose normal.  Eyes: EOM are normal. Pupils are equal, round, and reactive to light.  bilaterally injected conjunctiva  Neck: Normal range of motion. Neck supple.  Cardiovascular: Normal rate, regular rhythm and normal heart  sounds.  Exam reveals no gallop and no friction rub.   No murmur heard. Pulmonary/Chest: Effort normal and breath sounds normal. No respiratory distress. He has no wheezes.  Abdominal: Soft. Bowel sounds are normal. He exhibits no distension. There is tenderness. There is no rebound and no guarding.  Tender in the LUQ and epigastrium.  Lymphadenopathy:    He has no cervical adenopathy.  Neurological: He  is alert and oriented to person, place, and time.  Psychiatric: He has a normal mood and affect. His behavior is normal.     Assessment & Plan:

## 2012-08-12 NOTE — Patient Instructions (Addendum)
Finish [employer] with our Diabetes Educator Lupita Leash Plyler to discuss transitioning to prandial insulin  Follow-up with Dr. Burtis Junes on August 22nd to start prandial insulin and referral to pain clinic.

## 2012-08-12 NOTE — Assessment & Plan Note (Addendum)
Lab Results  Component Value Date   HGBA1C 13.1 05/31/2012   HGBA1C >14.0 03/22/2012   HGBA1C >14.0 01/12/2012     Assessment: Diabetes control: poor control (HgbA1C >9%) Progress toward A1C goal:  unchanged Comments: The patients DM II is poorly controlled on lantus 70 units qd, metformin, and glipizide.  Plan: Medications:  continue current medications Home glucose monitoring: Frequency: 3 times a day Timing: before breakfast;before lunch;before dinner Instruction/counseling given: reminded to bring blood glucose meter & log to each visit and reminded to bring medications to each visit Educational resources provided: handout Other plans: Today, we discussed the concept of prandial insulin in detail. The patient likes the idea and believes that he would be able to comply with a regimen. Currently, he does not have an orange card. The patient plans to complete the paperwork this week, as his sister will help him. We decided to hold off on changing his insulin regimen until he can complete the paperwork. In the meantime, he wave referred him to [facility] for [place]. The patient plans to bring his paperwork at this visit. Furthermore, we planned for the patient to return to f/u on August 22nd with his PCP to consider adding prandial insulin.

## 2012-08-12 NOTE — Progress Notes (Signed)
I saw and evaluated the patient.  I personally confirmed the key portions of the history and exam documented by Dr. Komanski and I reviewed pertinent patient test results.  The assessment, diagnosis, and plan were formulated together and I agree with the documentation in the resident's note.  

## 2012-08-16 ENCOUNTER — Encounter (HOSPITAL_COMMUNITY): Payer: Self-pay | Admitting: Emergency Medicine

## 2012-08-16 ENCOUNTER — Emergency Department (HOSPITAL_COMMUNITY)
Admission: EM | Admit: 2012-08-16 | Discharge: 2012-08-16 | Disposition: A | Discharge status: Home / Self Care | Payer: Self-pay | Attending: Emergency Medicine | Admitting: Emergency Medicine

## 2012-08-16 DIAGNOSIS — Z79899 Other long term (current) drug therapy: Secondary | ICD-10-CM | POA: Insufficient documentation

## 2012-08-16 DIAGNOSIS — Z7982 Long term (current) use of aspirin: Secondary | ICD-10-CM | POA: Insufficient documentation

## 2012-08-16 DIAGNOSIS — F3289 Other specified depressive episodes: Secondary | ICD-10-CM | POA: Insufficient documentation

## 2012-08-16 DIAGNOSIS — F329 Major depressive disorder, single episode, unspecified: Secondary | ICD-10-CM | POA: Insufficient documentation

## 2012-08-16 DIAGNOSIS — Z760 Encounter for issue of repeat prescription: Secondary | ICD-10-CM | POA: Insufficient documentation

## 2012-08-16 DIAGNOSIS — Z8719 Personal history of other diseases of the digestive system: Secondary | ICD-10-CM | POA: Insufficient documentation

## 2012-08-16 DIAGNOSIS — I10 Essential (primary) hypertension: Secondary | ICD-10-CM | POA: Insufficient documentation

## 2012-08-16 DIAGNOSIS — E119 Type 2 diabetes mellitus without complications: Secondary | ICD-10-CM | POA: Insufficient documentation

## 2012-08-16 DIAGNOSIS — K861 Other chronic pancreatitis: Secondary | ICD-10-CM | POA: Insufficient documentation

## 2012-08-16 DIAGNOSIS — K219 Gastro-esophageal reflux disease without esophagitis: Secondary | ICD-10-CM | POA: Insufficient documentation

## 2012-08-16 DIAGNOSIS — F17200 Nicotine dependence, unspecified, uncomplicated: Secondary | ICD-10-CM | POA: Insufficient documentation

## 2012-08-16 DIAGNOSIS — Z888 Allergy status to other drugs, medicaments and biological substances status: Secondary | ICD-10-CM | POA: Insufficient documentation

## 2012-08-16 DIAGNOSIS — G8929 Other chronic pain: Secondary | ICD-10-CM | POA: Insufficient documentation

## 2012-08-16 DIAGNOSIS — Z794 Long term (current) use of insulin: Secondary | ICD-10-CM | POA: Insufficient documentation

## 2012-08-16 DIAGNOSIS — F1210 Cannabis abuse, uncomplicated: Secondary | ICD-10-CM | POA: Insufficient documentation

## 2012-08-16 DIAGNOSIS — R109 Unspecified abdominal pain: Secondary | ICD-10-CM | POA: Insufficient documentation

## 2012-08-16 DIAGNOSIS — G4700 Insomnia, unspecified: Secondary | ICD-10-CM | POA: Insufficient documentation

## 2012-08-16 DIAGNOSIS — Z8669 Personal history of other diseases of the nervous system and sense organs: Secondary | ICD-10-CM | POA: Insufficient documentation

## 2012-08-16 DIAGNOSIS — F1010 Alcohol abuse, uncomplicated: Secondary | ICD-10-CM | POA: Insufficient documentation

## 2012-08-16 MED ORDER — OXYCODONE-ACETAMINOPHEN 10-325 MG PO TABS: 1.0000 | ORAL_TABLET | ORAL | Status: DC | PRN

## 2012-08-16 NOTE — ED Notes (Addendum)
Was seen on the 21 for same pain and has been vomited x 1 yesterday only has appointment on the 22 of aug needs pain med till can be seen then he staes

## 2012-08-16 NOTE — ED Provider Notes (Signed)
CSN: [id number]     Arrival date & time 08/16/12  1159 History     First MD Initiated Contact with Patient 08/16/12 1230     Chief Complaint  Patient presents with  . Abdominal Pain  . Medication Refill   (Consider location/radiation/quality/duration/timing/severity/associated sxs/prior Treatment) HPI..... patient has chronic epigastric and bilateral flank pain.   This is not a new problem.   He has run out of his pain medication. His next appointment is August 22 at the outpatient department with Dr. Maryelizabeth Kaufmann.  No fever, chills, vomiting, diarrhea.   Severity is mild. Nothing makes symptoms better or worse  Past Medical History  Diagnosis Date  . Chronic pancreatitis   . Smoker   . Gastritis due to alcohol without hemorrhage   . HTN (hypertension)   . Pancreatic lesion     appears innocent and related to chronic pancreatitis  . Heavy alcohol use     hx of  . Marijuana use     hx of  . Migraine   . Depression   . Helicobacter pylori gastritis 03/27/2011    on endoscopy, treated with omeprazole, clarithromycin and amoxicillin  . Insomnia   . ACE inhibitor-aggravated angioedema   . Sessile colonic polyp   . Diabetes mellitus   . GERD (gastroesophageal reflux disease)   . Chronic abdominal pain on narcotics 06/27/2012   Past Surgical History  Procedure Laterality Date  . Esophagogastroduodenoscopy  03/22/2011    Procedure: ESOPHAGOGASTRODUODENOSCOPY (EGD);  Surgeon: [name], MD;  Location: [facility];  Service: Endoscopy;  Laterality: N/A;  egd with balloon   . Balloon dilation  03/22/2011    Procedure: BALLOON DILATION;  Surgeon: [name], MD;  Location: [facility];  Service: Endoscopy;  Laterality: N/A;  . Eus  04/20/2011    Procedure: UPPER ENDOSCOPIC ULTRASOUND (EUS) LINEAR;  Surgeon: Rachael Fee, MD;  Location: [facility];  Service: Endoscopy;  Laterality: N/A;  . Colonoscopy w/ biopsies and polypectomy  09/12/11  . Colonoscopy N/A 05/22/2012     Procedure: COLONOSCOPY;  Surgeon: [name], MD;  Location: [facility] ENDOSCOPY;  Service: Endoscopy;  Laterality: N/A;   Family History  Problem Relation Age of Onset  . Colon cancer Neg Hx   . Stomach cancer Neg Hx   . Anesthesia problems Neg Hx   . Hypotension Neg Hx   . Pseudochol deficiency Neg Hx   . Malignant hyperthermia Neg Hx   . Hypertension Sister   . Diabetes Sister   . Heart disease Sister    History  Substance Use Topics  . Smoking status: Current Every Day Smoker -- 0.20 packs/day for 30 years    Types: Cigarettes  . Smokeless tobacco: Never Used     Comment: 4 -5 cigs/day.Wants to quit.  . Alcohol Use: No     Comment: sobriety since 2/13,was drinking 7-10 beer/daily    Review of Systems  All other systems reviewed and are negative.    Allergies  Ace inhibitors and Losartan  Home Medications   Current Outpatient Rx  Name  Route  Sig  Dispense  Refill  . ARIPiprazole (ABILIFY) 15 MG tablet   Oral   Take 22 mg by mouth at bedtime. Takes 15mg  tab, a 5mg  tab, and a 2mg  tab for 22mg  nightly         . aspirin 81 MG tablet   Oral   Take 1 tablet (81 mg total) by mouth daily.   30 tablet  4   . FLUoxetine (PROZAC) 20 MG capsule   Oral   Take 40 mg by mouth daily.          Marland Kitchen glipiZIDE (GLUCOTROL) 10 MG tablet   Oral   Take 10 mg by mouth 2 (two) times daily before a meal.         . hydrochlorothiazide (HYDRODIURIL) 25 MG tablet   Oral   Take 25 mg by mouth daily.         Marland Kitchen HYDROcodone-acetaminophen (NORCO/VICODIN) 5-325 MG per tablet   Oral   Take 1-2 tablets by mouth every 6 (six) hours as needed for pain.   20 tablet   0   . hydrOXYzine (ATARAX/VISTARIL) 10 MG tablet   Oral   Take 10 mg by mouth 3 (three) times daily as needed for itching.          . insulin glargine (LANTUS) 100 UNIT/ML injection   Subcutaneous   Inject 0.7 mLs (70 Units total) into the skin at bedtime.   10 mL      . lovastatin (MEVACOR) 40 MG tablet    Oral   Take 1 tablet (40 mg total) by mouth at bedtime.   30 tablet   2   . metFORMIN (GLUCOPHAGE) 1000 MG tablet   Oral   Take 1 tablet (1,000 mg total) by mouth 2 (two) times daily with a meal.   60 tablet   2   . omeprazole (PRILOSEC) 20 MG capsule   Oral   Take 20 mg by mouth daily.         Marland Kitchen oxyCODONE-acetaminophen (PERCOCET) 10-325 MG per tablet   Oral   Take 1 tablet by mouth every 4 (four) hours as needed for pain.   30 tablet   0   . Pancrelipase, Lip-Prot-Amyl, 36000 UNITS CPEP   Oral   Take 1 capsule by mouth 3 (three) times daily.         . pantoprazole (PROTONIX) 40 MG tablet   Oral   Take 1 tablet (40 mg total) by mouth daily.   20 tablet   0   . oxyCODONE-acetaminophen (PERCOCET) 10-325 MG per tablet   Oral   Take 1 tablet by mouth every 4 (four) hours as needed for pain.   30 tablet   0    BP 135/86  Pulse 99  Temp(Src) 98.3 F (36.8 C) (Oral)  Resp 20  SpO2 98% Physical Exam  Nursing note and vitals reviewed. Constitutional: He is oriented to person, place, and time. He appears well-developed and well-nourished.  HENT:  Head: Normocephalic and atraumatic.  Eyes: Conjunctivae and EOM are normal. Pupils are equal, round, and reactive to light.  Neck: Normal range of motion. Neck supple.  Cardiovascular: Normal rate, regular rhythm and normal heart sounds.   Pulmonary/Chest: Effort normal and breath sounds normal.  Abdominal: Soft. Bowel sounds are normal.  Minimal epigastric tenderness  Musculoskeletal: Normal range of motion.  Neurological: He is alert and oriented to person, place, and time.  Skin: Skin is warm and dry.  Psychiatric: He has a normal mood and affect.    ED Course   Procedures (including critical care time)  Labs Reviewed - No data to display No results found. 1. Abdominal pain     MDM  No acute abdomen.   Refill pain medication for one week.  Percocet 10/325 #30.  No testing necessary.  Donnetta Hutching,  MD 08/16/12 [phone]

## 2012-08-21 NOTE — Telephone Encounter (Signed)
PT DISCHARGED FROM PRACTICE

## 2012-08-26 ENCOUNTER — Other Ambulatory Visit: Payer: Self-pay | Admitting: *Deleted

## 2012-08-26 MED ORDER — PANTOPRAZOLE SODIUM 40 MG PO TBEC: 40.0000 mg | DELAYED_RELEASE_TABLET | Freq: Every day | ORAL | Status: DC

## 2012-08-27 ENCOUNTER — Emergency Department (HOSPITAL_COMMUNITY)
Admission: EM | Admit: 2012-08-27 | Discharge: 2012-08-27 | Disposition: A | Discharge status: Home / Self Care | Payer: No Typology Code available for payment source | Attending: Emergency Medicine | Admitting: Emergency Medicine

## 2012-08-27 ENCOUNTER — Encounter (HOSPITAL_COMMUNITY): Payer: Self-pay | Admitting: *Deleted

## 2012-08-27 DIAGNOSIS — Z860100 Personal history of colon polyps, unspecified: Secondary | ICD-10-CM | POA: Insufficient documentation

## 2012-08-27 DIAGNOSIS — Z79899 Other long term (current) drug therapy: Secondary | ICD-10-CM | POA: Insufficient documentation

## 2012-08-27 DIAGNOSIS — I10 Essential (primary) hypertension: Secondary | ICD-10-CM | POA: Insufficient documentation

## 2012-08-27 DIAGNOSIS — Z794 Long term (current) use of insulin: Secondary | ICD-10-CM | POA: Insufficient documentation

## 2012-08-27 DIAGNOSIS — T4275XA Adverse effect of unspecified antiepileptic and sedative-hypnotic drugs, initial encounter: Secondary | ICD-10-CM | POA: Insufficient documentation

## 2012-08-27 DIAGNOSIS — R7309 Other abnormal glucose: Secondary | ICD-10-CM | POA: Insufficient documentation

## 2012-08-27 DIAGNOSIS — F17200 Nicotine dependence, unspecified, uncomplicated: Secondary | ICD-10-CM | POA: Insufficient documentation

## 2012-08-27 DIAGNOSIS — K219 Gastro-esophageal reflux disease without esophagitis: Secondary | ICD-10-CM | POA: Insufficient documentation

## 2012-08-27 DIAGNOSIS — G8929 Other chronic pain: Secondary | ICD-10-CM

## 2012-08-27 DIAGNOSIS — Z8619 Personal history of other infectious and parasitic diseases: Secondary | ICD-10-CM | POA: Insufficient documentation

## 2012-08-27 DIAGNOSIS — R109 Unspecified abdominal pain: Secondary | ICD-10-CM | POA: Insufficient documentation

## 2012-08-27 DIAGNOSIS — Z8719 Personal history of other diseases of the digestive system: Secondary | ICD-10-CM | POA: Insufficient documentation

## 2012-08-27 DIAGNOSIS — F3289 Other specified depressive episodes: Secondary | ICD-10-CM | POA: Insufficient documentation

## 2012-08-27 DIAGNOSIS — E119 Type 2 diabetes mellitus without complications: Secondary | ICD-10-CM | POA: Insufficient documentation

## 2012-08-27 DIAGNOSIS — F329 Major depressive disorder, single episode, unspecified: Secondary | ICD-10-CM | POA: Insufficient documentation

## 2012-08-27 DIAGNOSIS — Z8679 Personal history of other diseases of the circulatory system: Secondary | ICD-10-CM | POA: Insufficient documentation

## 2012-08-27 DIAGNOSIS — Z7982 Long term (current) use of aspirin: Secondary | ICD-10-CM | POA: Insufficient documentation

## 2012-08-27 LAB — CBC WITH DIFFERENTIAL/PLATELET
Basophils Absolute: 0 10*3/uL (ref 0.0–0.1)
HCT: 52 % (ref 39.0–52.0)
Hemoglobin: 18.6 g/dL — ABNORMAL HIGH (ref 13.0–17.0)
Lymphocytes Relative: 19 % (ref 12–46)
Lymphs Abs: 2 10*3/uL (ref 0.7–4.0)
Monocytes Absolute: 1 10*3/uL (ref 0.1–1.0)
Monocytes Relative: 9 % (ref 3–12)
Neutro Abs: 7.5 10*3/uL (ref 1.7–7.7)
RBC: 5.58 MIL/uL (ref 4.22–5.81)
WBC: 10.7 10*3/uL — ABNORMAL HIGH (ref 4.0–10.5)

## 2012-08-27 LAB — COMPREHENSIVE METABOLIC PANEL
AST: 94 U/L — ABNORMAL HIGH (ref 0–37)
BUN: 12 mg/dL (ref 6–23)
CO2: 23 mEq/L (ref 19–32)
Chloride: 97 mEq/L (ref 96–112)
Creatinine, Ser: 0.67 mg/dL (ref 0.50–1.35)
GFR calc non Af Amer: >90 mL/min (ref >90)
Glucose, Bld: 344 mg/dL — ABNORMAL HIGH (ref 70–99)
Total Bilirubin: 0.4 mg/dL (ref 0.3–1.2)

## 2012-08-27 LAB — GLUCOSE, CAPILLARY: Glucose-Capillary: 299 mg/dL — ABNORMAL HIGH (ref 70–99)

## 2012-08-27 MED ORDER — ONDANSETRON HCL 4 MG/2ML IJ SOLN
4.0000 mg | Freq: Once | INTRAMUSCULAR | Status: AC
  Administered 2012-08-27: 4 mg via INTRAVENOUS
  Filled 2012-08-27: qty 2

## 2012-08-27 MED ORDER — OXYCODONE-ACETAMINOPHEN 5-325 MG PO TABS: ORAL_TABLET | ORAL | Status: DC

## 2012-08-27 MED ORDER — MORPHINE SULFATE 4 MG/ML IJ SOLN
4.0000 mg | Freq: Once | INTRAMUSCULAR | Status: AC
  Administered 2012-08-27: 4 mg via INTRAVENOUS
  Filled 2012-08-27: qty 1

## 2012-08-27 MED ORDER — SODIUM CHLORIDE 0.9 % IV BOLUS (SEPSIS)
1000.0000 mL | Freq: Once | INTRAVENOUS | Status: AC
  Administered 2012-08-27: 1000 mL via INTRAVENOUS

## 2012-08-27 NOTE — ED Notes (Signed)
Pt has chronic pancreatitis and needs pain medication.  Pt is diabetic and has history of elevated blood sugar.  Pt has left upper abdominal and mid back pain that he constantly has.

## 2012-08-27 NOTE — ED Provider Notes (Signed)
Medical screening examination/treatment/procedure(s) were performed by non-physician practitioner and as supervising physician I was immediately available for consultation/collaboration.  Shelda Jakes, MD 08/27/12 [phone]

## 2012-08-27 NOTE — ED Notes (Signed)
CBG is 485. Notified Nurse Martie Lee.

## 2012-08-27 NOTE — ED Provider Notes (Signed)
CSN: [id number]     Arrival date & time 08/27/12  1321 History     First MD Initiated Contact with Patient 08/27/12 1655     Chief Complaint  Patient presents with  . Abdominal Pain   (Consider location/radiation/quality/duration/timing/severity/associated sxs/prior Treatment) HPI  Michael [name] is a 57 y.o. male complaining of exacerbation of chronic pancreatitis patient states his pain is in his epigastrium and left upper quadrant radiates to the back, and is rated at 9/10, no exacerbating or alleviating factors identified. Patient is out of his pain medicine and states that he just needs 40-50 tablets to tie him over until he can see his primary care physician. He denies fever, nausea vomiting, change in bowel or bladder habits, chest pain, shortness of breath. States that he's been taking his insulin as directed but his sugars have been chronically hard to control. Denies any current alcohol use  Past Medical History  Diagnosis Date  . Chronic pancreatitis   . Smoker   . Gastritis due to alcohol without hemorrhage   . HTN (hypertension)   . Pancreatic lesion     appears innocent and related to chronic pancreatitis  . Heavy alcohol use     hx of  . Marijuana use     hx of  . Migraine   . Depression   . Helicobacter pylori gastritis 03/27/2011    on endoscopy, treated with omeprazole, clarithromycin and amoxicillin  . Insomnia   . ACE inhibitor-aggravated angioedema   . Sessile colonic polyp   . Diabetes mellitus   . GERD (gastroesophageal reflux disease)   . Chronic abdominal pain on narcotics 06/27/2012   Past Surgical History  Procedure Laterality Date  . Esophagogastroduodenoscopy  03/22/2011    Procedure: ESOPHAGOGASTRODUODENOSCOPY (EGD);  Surgeon: [name], MD;  Location: [facility];  Service: Endoscopy;  Laterality: N/A;  egd with balloon   . Balloon dilation  03/22/2011    Procedure: BALLOON DILATION;  Surgeon: [name], MD;  Location: [facility];   Service: Endoscopy;  Laterality: N/A;  . Eus  04/20/2011    Procedure: UPPER ENDOSCOPIC ULTRASOUND (EUS) LINEAR;  Surgeon: Rachael Fee, MD;  Location: [facility];  Service: Endoscopy;  Laterality: N/A;  . Colonoscopy w/ biopsies and polypectomy  09/12/11  . Colonoscopy N/A 05/22/2012    Procedure: COLONOSCOPY;  Surgeon: [name], MD;  Location[facility] ENDOSCOPY;  Service: Endoscopy;  Laterality: N/A;   Family History  Problem Relation Age of Onset  . Colon cancer Neg Hx   . Stomach cancer Neg Hx   . Anesthesia problems Neg Hx   . Hypotension Neg Hx   . Pseudochol deficiency Neg Hx   . Malignant hyperthermia Neg Hx   . Hypertension Sister   . Diabetes Sister   . Heart disease Sister    History  Substance Use Topics  . Smoking status: Current Every Day Smoker -- 0.20 packs/day for 30 years    Types: Cigarettes  . Smokeless tobacco: Never Used     Comment: 4 -5 cigs/day.Wants to quit.  . Alcohol Use: No     Comment: sobriety since 2/13,was drinking 7-10 beer/daily    Review of Systems 10 systems reviewed and found to be negative, except as noted in the HPI   Allergies  Ace inhibitors and Losartan  Home Medications   Current Outpatient Rx  Name  Route  Sig  Dispense  Refill  . ARIPiprazole (ABILIFY) 15 MG tablet   Oral  Take 22 mg by mouth at bedtime. Takes 15mg  tab, a 5mg  tab, and a 2mg  tab for 22mg  nightly         . aspirin 81 MG tablet   Oral   Take 1 tablet (81 mg total) by mouth daily.   30 tablet   4   . FLUoxetine (PROZAC) 20 MG capsule   Oral   Take 40 mg by mouth daily.          Marland Kitchen glipiZIDE (GLUCOTROL) 10 MG tablet   Oral   Take 10 mg by mouth 2 (two) times daily before a meal.         . hydrochlorothiazide (HYDRODIURIL) 25 MG tablet   Oral   Take 25 mg by mouth daily.         . hydrOXYzine (ATARAX/VISTARIL) 10 MG tablet   Oral   Take 10 mg by mouth 3 (three) times daily as needed for itching.          . insulin glargine (LANTUS)  100 UNIT/ML injection   Subcutaneous   Inject 0.7 mLs (70 Units total) into the skin at bedtime.   10 mL      . lovastatin (MEVACOR) 40 MG tablet   Oral   Take 1 tablet (40 mg total) by mouth at bedtime.   30 tablet   2   . metFORMIN (GLUCOPHAGE) 1000 MG tablet   Oral   Take 1 tablet (1,000 mg total) by mouth 2 (two) times daily with a meal.   60 tablet   2   . omeprazole (PRILOSEC) 20 MG capsule   Oral   Take 20 mg by mouth daily.         Marland Kitchen oxyCODONE-acetaminophen (PERCOCET) 10-325 MG per tablet   Oral   Take 1 tablet by mouth every 4 (four) hours as needed for pain.   30 tablet   0   . Pancrelipase, Lip-Prot-Amyl, 36000 UNITS CPEP   Oral   Take 1 capsule by mouth 3 (three) times daily.         . pantoprazole (PROTONIX) 40 MG tablet   Oral   Take 1 tablet (40 mg total) by mouth daily.   90 tablet   0    BP 132/90  Pulse 90  Temp(Src) 97.9 F (36.6 C) (Oral)  Resp 16  SpO2 98% Physical Exam  Nursing note and vitals reviewed. Constitutional: He is oriented to person, place, and time. He appears well-developed and well-nourished. No distress.  HENT:  Head: Normocephalic.  Mouth/Throat: Oropharynx is clear and moist.  Eyes: Conjunctivae and EOM are normal. Pupils are equal, round, and reactive to light.  Cardiovascular: Normal rate.   Pulmonary/Chest: Effort normal and breath sounds normal. No stridor. No respiratory distress. He has no wheezes. He has no rales. He exhibits no tenderness.  Abdominal: Soft. Bowel sounds are normal. He exhibits no distension and no mass. There is tenderness. There is no rebound and no guarding.  Tenderness to palpation of the epigastrium with no guarding or rebound.  Musculoskeletal: Normal range of motion.  Neurological: He is alert and oriented to person, place, and time.  Psychiatric: He has a normal mood and affect.    ED Course   Procedures (including critical care time)  Labs Reviewed  GLUCOSE, CAPILLARY -  Abnormal; Notable for the following:    Glucose-Capillary 485 (*)    All other components within normal limits  CBC WITH DIFFERENTIAL - Abnormal; Notable for the following:  WBC 10.7 (*)    Hemoglobin 18.6 (*)    All other components within normal limits  COMPREHENSIVE METABOLIC PANEL - Abnormal; Notable for the following:    Glucose, Bld 344 (*)    AST 94 (*)    ALT 94 (*)    Alkaline Phosphatase 237 (*)    All other components within normal limits  LIPASE, BLOOD - Abnormal; Notable for the following:    Lipase 77 (*)    All other components within normal limits   No results found. 1. Chronic abdominal pain on narcotics   2. Elevated random blood glucose level     MDM   Filed Vitals:   08/27/12 1333 08/27/12 1530  BP: 167/92 132/90  Pulse: 114 90  Temp: 98.6 F (37 C) 97.9 F (36.6 C)  TempSrc: Oral Oral  Resp: 18 16  SpO2: 97% 98%     Michael [name] is a 57 y.o. male here for medication refill for his chronic pancreatitis. Patient has elevated CBG, anion gap of 16. Physical exam is reassuring and patient has no other associated symptoms that would imply a complication other than his normal pain from chronic pancreatitis and labile diabetes. Plan is IV fluids, IV morphine.  I have explained to the patient we can only write for 2-3 days of pain medication at the emergency room he will have to contact his primary care physician for further pain management. Chart review shows that he signed a pain contract with Dr. Burtis Junes for 60x 10 mg Percocet per month.   Blood glucose reduced to under 300 patient is appropriate for discharge. I have advised him to follow with his primary care physician for further chronic pain management.  Medications  sodium chloride 0.9 % bolus 1,000 mL (1,000 mLs Intravenous New Bag/Given 08/27/12 1810)  morphine 4 MG/ML injection 4 mg (4 mg Intravenous Given 08/27/12 1811)  ondansetron (ZOFRAN) injection 4 mg (4 mg Intravenous Given 08/27/12 1811)     Pt is hemodynamically stable, appropriate for, and amenable to discharge at this time. Pt verbalized understanding and agrees with care plan. All questions answered. Outpatient follow-up and specific return precautions discussed.    New Prescriptions   No medications on file    Note: Portions of this report may have been transcribed using voice recognition software. Every effort was made to ensure accuracy; however, inadvertent computerized transcription errors may be present    Wynetta Emery, PA-C 08/27/12 1833

## 2012-09-02 ENCOUNTER — Telehealth: Payer: Self-pay | Admitting: Licensed Clinical Social Worker

## 2012-09-02 NOTE — Telephone Encounter (Signed)
Michael [name] placed call into CSW.  Pt states he now has to pay a copay at the [facility] and does not know why.  Pt is being helping within [facility] by a Engineer, water.  CSW informed Michael [name] that with the [facility] Card some do have to pay a $6 copay for medication, MAP is generally free.  Pt states something was done wrong with his [employer] and it needs to be fixed back.  CSW placed call to Artist, pt is required to [facility] due to pt no longer being homeless.  Pt's notarized letter indicates he is living with a family member.  Financial Counselor indicates pt was informed at time of renewal.

## 2012-09-03 ENCOUNTER — Ambulatory Visit (INDEPENDENT_AMBULATORY_CARE_PROVIDER_SITE_OTHER): Payer: No Typology Code available for payment source | Admitting: Dietician

## 2012-09-03 ENCOUNTER — Other Ambulatory Visit: Payer: Self-pay | Admitting: *Deleted

## 2012-09-03 ENCOUNTER — Encounter: Payer: Self-pay | Admitting: Dietician

## 2012-09-03 ENCOUNTER — Other Ambulatory Visit: Payer: Self-pay | Admitting: Internal Medicine

## 2012-09-03 VITALS — Wt 218.0 lb

## 2012-09-03 DIAGNOSIS — E1165 Type 2 diabetes mellitus with hyperglycemia: Secondary | ICD-10-CM

## 2012-09-03 DIAGNOSIS — IMO0001 Reserved for inherently not codable concepts without codable children: Secondary | ICD-10-CM

## 2012-09-03 DIAGNOSIS — IMO0002 Reserved for concepts with insufficient information to code with codable children: Secondary | ICD-10-CM

## 2012-09-03 NOTE — Progress Notes (Signed)
Diabetes Self-Management Education  Visit Number: Follow-up 1  09/04/2012 [name], identified by name and date of birth, is a 57 y.o. male with  .  Other people present during visit:      ASSESSMENT  Patient Concerns:     Weight 218 lb (98.884 kg). Body mass index is 38.63 kg/(m^2).  Lab Results: LDL Cholesterol  Date Value Range Status  06/14/2012    Final           Not calculated due to Triglyceride >400.     Suggest ordering Direct LDL (Unit Code: 81191).           Hemoglobin A1C  Date Value Range Status  09/03/2012 12.2   Final  11/13/2010 6.7* <5.7 % Final     (NOTE)                                                                               Family History  Problem Relation Age of Onset  . Colon cancer Neg Hx   . Stomach cancer Neg Hx   . Anesthesia problems Neg Hx   . Hypotension Neg Hx   . Pseudochol deficiency Neg Hx   . Malignant hyperthermia Neg Hx   . Hypertension Sister   . Diabetes Sister   . Heart disease Sister    History  Substance Use Topics  . Smoking status: Current Every Day Smoker -- 0.20 packs/day for 30 years    Types: Cigarettes  . Smokeless tobacco: Never Used     Comment: 4 -5 cigs/day.Wants to quit.  . Alcohol Use: No     Comment: sobriety since 2/13,was drinking 7-10 beer/daily     Assessment comments:   Weight 218#, reports  Renewed orange card. Reports appropriate use of insulin, still having troubles affording pen needles. Gave him 30+ needles today, suggest changing patient to levemir when adding prandial insulin.   Estimated TDD= 50-120 units/day, current dose of insulin 70- units daily. Still uncontrolled despite maximal oral agents. Agree with addition of prandial insulin Basal @50 % is 25-60 units CBGs in 200-400 range, a1c is 12.2% today decreased from 13% but still above goal. Patient verbalizing readiness to start prandial insulin Suggest adding 10% of current basal (7 units before each meal while  simultaneously decreasing current basal dose by same amount (  21 units= ~ 50 units basal total) and stopping glipizide   Diet Recall: Eats 2-3 meals a day with dinner being his largest. Always eats breakfast and dinner, skips lunch ~ 4 x a week. Trying to make healthier choices. Reports very seldom soda, ocassional juice, fruit twice a week, veggies and starch daily. Encouraged limited portions of healthy foods to stabilize weight.    Individualized Plan for Diabetes Self-Management Training:  Patient individualized diabetes plan discussed today with patient and includes:   Nutrition-  Medications,  monitoring how to handle highs and lows,   Education Topics Reviewed with Patient Today:  Topic Points Discussed  Disease State    Nutrition Management    Physical Activity and Exercise    Medications Reviewed patients medication for diabetes, action, purpose, timing of dose and side effects.  Monitoring Purpose and frequency of SMBG.;Identified appropriate  SMBG and A1C goals.  Acute Complications Taught treatment of hypoglycemia - the 15 rule.  Chronic Complications    Psychosocial Adjustment    Goal Setting    Preconception Care (if applicable)      PATIENTS GOALS   Learning Objective(s):     Goal The patient agrees to:  Nutrition Follow meal plan discussed  Physical Activity    Medications    Monitoring    Problem Solving    Reducing Risk treat hypoglycemia with 15 grams of carbs if blood glucose less than 70mg /dL  Health Coping     Patient Self-Evaluation of Goals (Subsequent Visits)  Goal The patient rates self as meeting goals (% of time)  Nutrition 50 - 75 %  Physical Activity    Medications >75%  Monitoring    Problem Solving    Reducing Risk < 25%  Health Coping       PERSONALIZED PLAN / SUPPORT  Self-Management Support:  Doctor's office;CDE visits ______________________________________________________________________  Outcomes  Expected Outcomes:   Demonstrated interest in learning. Expect positive outcomes Self-Care Barriers:  Low literacy;Lack of material resources Education material provided: yes If problems or questions, patient to contact team via:  Phone Time in: 1345     Time out: 1445 Future DSME appointment: - 4-6 wks   Plyler, Michael [name] 09/04/2012 10:09 AM

## 2012-09-04 ENCOUNTER — Emergency Department (HOSPITAL_COMMUNITY)
Admission: EM | Admit: 2012-09-04 | Discharge: 2012-09-05 | Disposition: A | Discharge status: Home / Self Care | Payer: No Typology Code available for payment source | Attending: Emergency Medicine | Admitting: Emergency Medicine

## 2012-09-04 ENCOUNTER — Encounter (HOSPITAL_COMMUNITY): Payer: Self-pay | Admitting: Adult Health

## 2012-09-04 ENCOUNTER — Other Ambulatory Visit: Payer: Self-pay | Admitting: *Deleted

## 2012-09-04 DIAGNOSIS — F3289 Other specified depressive episodes: Secondary | ICD-10-CM | POA: Insufficient documentation

## 2012-09-04 DIAGNOSIS — Z794 Long term (current) use of insulin: Secondary | ICD-10-CM | POA: Insufficient documentation

## 2012-09-04 DIAGNOSIS — Z79899 Other long term (current) drug therapy: Secondary | ICD-10-CM | POA: Insufficient documentation

## 2012-09-04 DIAGNOSIS — K861 Other chronic pancreatitis: Secondary | ICD-10-CM | POA: Insufficient documentation

## 2012-09-04 DIAGNOSIS — Z860100 Personal history of colon polyps, unspecified: Secondary | ICD-10-CM | POA: Insufficient documentation

## 2012-09-04 DIAGNOSIS — F329 Major depressive disorder, single episode, unspecified: Secondary | ICD-10-CM | POA: Insufficient documentation

## 2012-09-04 DIAGNOSIS — E1165 Type 2 diabetes mellitus with hyperglycemia: Secondary | ICD-10-CM

## 2012-09-04 DIAGNOSIS — G8929 Other chronic pain: Secondary | ICD-10-CM

## 2012-09-04 DIAGNOSIS — R109 Unspecified abdominal pain: Secondary | ICD-10-CM | POA: Insufficient documentation

## 2012-09-04 DIAGNOSIS — I10 Essential (primary) hypertension: Secondary | ICD-10-CM | POA: Insufficient documentation

## 2012-09-04 DIAGNOSIS — IMO0002 Reserved for concepts with insufficient information to code with codable children: Secondary | ICD-10-CM

## 2012-09-04 DIAGNOSIS — K219 Gastro-esophageal reflux disease without esophagitis: Secondary | ICD-10-CM | POA: Insufficient documentation

## 2012-09-04 DIAGNOSIS — Z8719 Personal history of other diseases of the digestive system: Secondary | ICD-10-CM | POA: Insufficient documentation

## 2012-09-04 DIAGNOSIS — Z8679 Personal history of other diseases of the circulatory system: Secondary | ICD-10-CM | POA: Insufficient documentation

## 2012-09-04 DIAGNOSIS — E119 Type 2 diabetes mellitus without complications: Secondary | ICD-10-CM | POA: Insufficient documentation

## 2012-09-04 DIAGNOSIS — F17200 Nicotine dependence, unspecified, uncomplicated: Secondary | ICD-10-CM | POA: Insufficient documentation

## 2012-09-04 LAB — COMPREHENSIVE METABOLIC PANEL
ALT: 27 U/L (ref 0–53)
Alkaline Phosphatase: 143 U/L — ABNORMAL HIGH (ref 39–117)
CO2: 27 mEq/L (ref 19–32)
Calcium: 9.1 mg/dL (ref 8.4–10.5)
GFR calc Af Amer: >90 mL/min (ref >90)
GFR calc non Af Amer: >90 mL/min (ref >90)
Glucose, Bld: 247 mg/dL — ABNORMAL HIGH (ref 70–99)
Sodium: 136 mEq/L (ref 135–145)

## 2012-09-04 LAB — CBC WITH DIFFERENTIAL/PLATELET
Basophils Absolute: 0 10*3/uL (ref 0.0–0.1)
Basophils Relative: 0 % (ref 0–1)
Hemoglobin: 17.6 g/dL — ABNORMAL HIGH (ref 13.0–17.0)
MCHC: 36.5 g/dL — ABNORMAL HIGH (ref 30.0–36.0)
Neutro Abs: 8.6 10*3/uL — ABNORMAL HIGH (ref 1.7–7.7)
Neutrophils Relative %: 69 % (ref 43–77)
Platelets: 179 10*3/uL (ref 150–400)
RDW: 13.9 % (ref 11.5–15.5)

## 2012-09-04 NOTE — ED Notes (Signed)
NURSE FIRST ROUNDS : NURSE EXPLAINED WAIT TIME / DELAY AND PROCESS TO PT.

## 2012-09-04 NOTE — Patient Instructions (Addendum)
Please make a follow up appointment with Norm Parcel in September (3-4 weeks)  Please try to eat Baked foods, lot's of vegetables,  less oil, greasy and sugary foods.   Always carry an easy to chew candy or glucose tablets with you all the time.   Michael [name] [phone]

## 2012-09-04 NOTE — ED Notes (Signed)
Presents with Pancreatic pain in bilateral upper abdomen that is chronic and got worse today. Denies nausea and vomiting. Pt states it feels just like a pancreatic pain.

## 2012-09-05 MED ORDER — OXYCODONE-ACETAMINOPHEN 5-325 MG PO TABS
2.0000 | ORAL_TABLET | Freq: Once | ORAL | Status: AC
  Administered 2012-09-05: 2 via ORAL
  Filled 2012-09-05: qty 2

## 2012-09-05 MED ORDER — OXYCODONE-ACETAMINOPHEN 5-325 MG PO TABS: 2.0000 | ORAL_TABLET | Freq: Four times a day (QID) | ORAL | Status: DC | PRN

## 2012-09-05 NOTE — ED Provider Notes (Signed)
CSN: [id number]     Arrival date & time 09/04/12  1958 History     First MD Initiated Contact with Patient 09/05/12 0034     Chief Complaint  Patient presents with  . Abdominal Pain  . Pancreatitis   (Consider location/radiation/quality/duration/timing/severity/associated sxs/prior Treatment) HPI 57 yo male presents to the ER with complaint of abdominal pain similar to his chronic pancreatitis.  Pt reports his chronic pain worsened this morning consistent with a flare of his pancreatitis.  He reports he always has pain, but usually he can control it.  He reports past history of alcohol abuse, but nothing in the last several years.  Pt reports he was being seen by a gastroenterologist, but is waiting for referral to a new one as well as pain clinic.  Pt has several visits to the ER for similar complaint.  No fever, no vomiting.  Pt has been able to eat and drink.  Pt is followed by the [facility] clinic, but due to problems with prescriptions with them, is not allowed to have his pain prescriptions refilled by them.    Past Medical History  Diagnosis Date  . Chronic pancreatitis   . Smoker   . Gastritis due to alcohol without hemorrhage   . HTN (hypertension)   . Pancreatic lesion     appears innocent and related to chronic pancreatitis  . Heavy alcohol use     hx of  . Marijuana use     hx of  . Migraine   . Depression   . Helicobacter pylori gastritis 03/27/2011    on endoscopy, treated with omeprazole, clarithromycin and amoxicillin  . Insomnia   . ACE inhibitor-aggravated angioedema   . Sessile colonic polyp   . Diabetes mellitus   . GERD (gastroesophageal reflux disease)   . Chronic abdominal pain on narcotics 06/27/2012   Past Surgical History  Procedure Laterality Date  . Esophagogastroduodenoscopy  03/22/2011    Procedure: ESOPHAGOGASTRODUODENOSCOPY (EGD);  Surgeon: [name], MD;  Location: [facility];  Service: Endoscopy;  Laterality: N/A;  egd with balloon   . Balloon  dilation  03/22/2011    Procedure: BALLOON DILATION;  Surgeon: [name], MD;  Location: [facility];  Service: Endoscopy;  Laterality: N/A;  . Eus  04/20/2011    Procedure: UPPER ENDOSCOPIC ULTRASOUND (EUS) LINEAR;  Surgeon: Rachael Fee, MD;  Location: [facility];  Service: Endoscopy;  Laterality: N/A;  . Colonoscopy w/ biopsies and polypectomy  09/12/11  . Colonoscopy N/A 05/22/2012    Procedure: COLONOSCOPY;  Surgeon: [name], MD;  [facility];  Service: Endoscopy;  Laterality: N/A;   Family History  Problem Relation Age of Onset  . Colon cancer Neg Hx   . Stomach cancer Neg Hx   . Anesthesia problems Neg Hx   . Hypotension Neg Hx   . Pseudochol deficiency Neg Hx   . Malignant hyperthermia Neg Hx   . Hypertension Sister   . Diabetes Sister   . Heart disease Sister    History  Substance Use Topics  . Smoking status: Current Every Day Smoker -- 0.20 packs/day for 30 years    Types: Cigarettes  . Smokeless tobacco: Never Used     Comment: 4 -5 cigs/day.Wants to quit.  . Alcohol Use: No     Comment: sobriety since 2/13,was drinking 7-10 beer/daily    Review of Systems  All other systems reviewed and are negative.    Allergies  Ace inhibitors and Losartan  Home Medications   Current Outpatient Rx  Name  Route  Sig  Dispense  Refill  . ARIPiprazole (ABILIFY) 15 MG tablet   Oral   Take 22 mg by mouth at bedtime. Takes 15mg  tab, a 5mg  tab, and a 2mg  tab for 22mg  nightly         . aspirin 81 MG tablet   Oral   Take 1 tablet (81 mg total) by mouth daily.   30 tablet   4   . FLUoxetine (PROZAC) 20 MG capsule   Oral   Take 40 mg by mouth daily.          Marland Kitchen glipiZIDE (GLUCOTROL) 10 MG tablet   Oral   Take 10 mg by mouth 2 (two) times daily before a meal.         . hydrochlorothiazide (HYDRODIURIL) 25 MG tablet   Oral   Take 25 mg by mouth daily.         . hydrOXYzine (ATARAX/VISTARIL) 10 MG tablet   Oral   Take 10 mg by mouth 3  (three) times daily as needed for itching.          . insulin glargine (LANTUS) 100 UNIT/ML injection   Subcutaneous   Inject 0.7 mLs (70 Units total) into the skin at bedtime.   10 mL      . lovastatin (MEVACOR) 40 MG tablet   Oral   Take 1 tablet (40 mg total) by mouth at bedtime.   30 tablet   2   . metFORMIN (GLUCOPHAGE) 1000 MG tablet   Oral   Take 1 tablet (1,000 mg total) by mouth 2 (two) times daily with a meal.   60 tablet   2   . omeprazole (PRILOSEC) 20 MG capsule   Oral   Take 20 mg by mouth daily.         Marland Kitchen oxyCODONE-acetaminophen (PERCOCET) 10-325 MG per tablet   Oral   Take 1 tablet by mouth every 4 (four) hours as needed for pain.   30 tablet   0   . Pancrelipase, Lip-Prot-Amyl, 36000 UNITS CPEP   Oral   Take 1 capsule by mouth 3 (three) times daily.         . pantoprazole (PROTONIX) 40 MG tablet   Oral   Take 1 tablet (40 mg total) by mouth daily.   90 tablet   0   . oxyCODONE-acetaminophen (PERCOCET/ROXICET) 5-325 MG per tablet   Oral   Take 2 tablets by mouth every 6 (six) hours as needed for pain.   15 tablet   0    BP 167/95  Pulse 80  Temp(Src) 98.8 F (37.1 C) (Oral)  Resp 14  Wt 218 lb (98.884 kg)  BMI 38.63 kg/m2  SpO2 100% Physical Exam  Nursing note and vitals reviewed. Constitutional: He is oriented to person, place, and time. He appears well-developed and well-nourished.  HENT:  Head: Normocephalic and atraumatic.  Nose: Nose normal.  Mouth/Throat: Oropharynx is clear and moist.  Eyes: Conjunctivae and EOM are normal. Pupils are equal, round, and reactive to light.  Neck: Normal range of motion. Neck supple. No JVD present. No tracheal deviation present. No thyromegaly present.  Cardiovascular: Normal rate, regular rhythm, normal heart sounds and intact distal pulses.  Exam reveals no gallop and no friction rub.   No murmur heard. Pulmonary/Chest: Effort normal and breath sounds normal. No stridor. No respiratory  distress. He has no wheezes. He has no rales. He exhibits  no tenderness.  Abdominal: Soft. Bowel sounds are normal. He exhibits no distension and no mass. There is tenderness (TTP throughout the abdomen). There is no rebound and no guarding.  Musculoskeletal: Normal range of motion. He exhibits no edema and no tenderness.  Lymphadenopathy:    He has no cervical adenopathy.  Neurological: He is alert and oriented to person, place, and time. He exhibits normal muscle tone. Coordination normal.  Skin: Skin is warm and dry. No rash noted. No erythema. No pallor.  Psychiatric: He has a normal mood and affect. His behavior is normal. Judgment and thought content normal.    ED Course   Procedures (including critical care time)  Labs Reviewed  LIPASE, BLOOD - Abnormal; Notable for the following:    Lipase 447 (*)    All other components within normal limits  COMPREHENSIVE METABOLIC PANEL - Abnormal; Notable for the following:    Glucose, Bld 247 (*)    Albumin 3.4 (*)    Alkaline Phosphatase 143 (*)    All other components within normal limits  CBC WITH DIFFERENTIAL - Abnormal; Notable for the following:    WBC 12.6 (*)    Hemoglobin 17.6 (*)    MCHC 36.5 (*)    Neutro Abs 8.6 (*)    Monocytes Absolute 1.1 (*)    All other components within normal limits   No results found. 1. Pancreatitis, chronic   2. Chronic abdominal pain on narcotics     MDM  56 yo male with acute on chronic pancreatitis.  Pt does have elevated lipase, but has not had n/v, and eating does not seem to worsen his pain.  He refuses admission at this time.  I have written for a short course of pain medications, and let the patient know he cannot continue to have his chronic pain management be done through the ER.  Pt has f/u next week with his pcm and is expecting referrals at that time.  Olivia Mackie, MD 09/05/12 [phone]

## 2012-09-05 NOTE — ED Notes (Signed)
Pt states understanding of discharge instructions 

## 2012-09-06 ENCOUNTER — Other Ambulatory Visit: Payer: Self-pay | Admitting: Internal Medicine

## 2012-09-06 DIAGNOSIS — K861 Other chronic pancreatitis: Secondary | ICD-10-CM

## 2012-09-06 MED ORDER — INSULIN GLARGINE 100 UNIT/ML ~~LOC~~ SOLN: 70.0000 [IU] | Freq: Every day | SUBCUTANEOUS | Status: DC

## 2012-09-06 MED ORDER — GLUCOSE BLOOD VI STRP: ORAL_STRIP | Status: DC

## 2012-09-09 ENCOUNTER — Telehealth: Payer: Self-pay | Admitting: Licensed Clinical Social Worker

## 2012-09-09 NOTE — Telephone Encounter (Signed)
Michael [name] placed several calls to CSW today.  Pt's main issues is cost of medications and states that test strips have yet to be sent to pharmacy.  CSW informed [name] according to EMR, Dr. Burtis Michael [name] sent Rx to [facility] on 09/06/12.  Pt's speech is pressured, difficulty to follow.  Pt complaining of the change of his [facility] card from homeless to having a current residence which now incurs a pharmacy copay.  Pt states his medications should be free because he can not afford them.  Pt has been utilizing services at [facility], congregational nursing program to obtain assistance with obtaining medications.  Pt stating something has to be done because he has had multiple trips to the ED to address his pancreatitis.  Pt states he thinks he needs a MRI and another colonoscopy because of his pancreatitis.  Pt states he feels his previous Gastroenterologist "did not tell me the whole story".  Pt presents to the [facility] and states he is scheduling appt's.  Pt requesting assistance with bus fare passes and toiletries.  CSW provided Michael [name] with 2 all day passes and items from [facility] pantry. Pt was informed of the change to his [facility] card during the re-application process by Michael [name].  CSW has also explained the change due to residency.  Pt states he card needs to be changed back.  Michael [name] voiced frustration and anger towards financial counselor.  Stating "I'm trying not to get angry".  CSW informed Michael [name] the [facility] application process is the same across the county.

## 2012-09-11 ENCOUNTER — Encounter: Payer: Self-pay | Admitting: Licensed Clinical Social Worker

## 2012-09-12 NOTE — Progress Notes (Signed)
Michael [name] presents today to provide CSW with letter stating he has been retroactively approved for Medicaid through July 22, 2013.  Pt states he will take the information to his Disability advocate.  Michael [name] requesting assistance with bus fare and food pantry.  Pt provided with one bus fare and several food items.  Pt informed CSW will sign him up with [facility] to transportation after his 8/22 [facility] appt.  Pt denies add'l needs at this time.

## 2012-09-13 ENCOUNTER — Telehealth: Payer: Self-pay | Admitting: *Deleted

## 2012-09-13 ENCOUNTER — Encounter: Payer: Self-pay | Admitting: Internal Medicine

## 2012-09-13 ENCOUNTER — Ambulatory Visit (INDEPENDENT_AMBULATORY_CARE_PROVIDER_SITE_OTHER): Payer: Medicaid Other | Admitting: Internal Medicine

## 2012-09-13 VITALS — BP 113/73 | HR 91 | Temp 97.8°F | Ht 63.0 in | Wt 218.8 lb

## 2012-09-13 DIAGNOSIS — IMO0002 Reserved for concepts with insufficient information to code with codable children: Secondary | ICD-10-CM

## 2012-09-13 DIAGNOSIS — E1165 Type 2 diabetes mellitus with hyperglycemia: Secondary | ICD-10-CM

## 2012-09-13 DIAGNOSIS — IMO0001 Reserved for inherently not codable concepts without codable children: Secondary | ICD-10-CM

## 2012-09-13 MED ORDER — INSULIN ASPART 100 UNIT/ML ~~LOC~~ SOLN: 10.0000 [IU] | Freq: Once | SUBCUTANEOUS | Status: DC

## 2012-09-13 NOTE — Telephone Encounter (Signed)
Call from [facility] - requesting clarification Of Novolog - is it once a day or 3x daily with [facility]

## 2012-09-13 NOTE — Progress Notes (Signed)
Subjective:   Patient ID: Michael [name] male   DOB: Aug 10, 1955 57 y.o.   MRN: [id number]  HPI: [name] is a 57 y.o. African American man with a past medical history of hypertension, poorly controlled diabetes, chronic pancreatitis with chronic pain coming in for diabetes followup. Pt still continues to have difficulty achieving good glycemic control and understanding of management. Pt has been also suffering 2/2 lack of chronic pancreatitis follow up after his dismissal from GI office given failed UDS screening. Pt has been experecinng increased appetite and gained weight. He continues to see his psychiatrist which is imperative for patient in terms of ability to have motivation to continue his treatments. He does have some polyuria and polydipsia but denied any nausea/vomiting/diarrhea, dark colored stools, or fever/chills.     Past Medical History  Diagnosis Date  . Chronic pancreatitis   . Smoker   . Gastritis due to alcohol without hemorrhage   . HTN (hypertension)   . Pancreatic lesion     appears innocent and related to chronic pancreatitis  . Heavy alcohol use     hx of  . Marijuana use     hx of  . Migraine   . Depression   . Helicobacter pylori gastritis 03/27/2011    on endoscopy, treated with omeprazole, clarithromycin and amoxicillin  . Insomnia   . ACE inhibitor-aggravated angioedema   . Sessile colonic polyp   . Diabetes mellitus   . GERD (gastroesophageal reflux disease)   . Chronic abdominal pain on narcotics 06/27/2012   Current Outpatient Prescriptions  Medication Sig Dispense Refill  . ARIPiprazole (ABILIFY) 15 MG tablet Take 22 mg by mouth at bedtime. Takes 15mg  tab, a 5mg  tab, and a 2mg  tab for 22mg  nightly      . aspirin 81 MG tablet Take 1 tablet (81 mg total) by mouth daily.  30 tablet  4  . FLUoxetine (PROZAC) 20 MG capsule Take 40 mg by mouth daily.       Marland Kitchen glipiZIDE (GLUCOTROL) 10 MG tablet Take 10 mg by mouth 2 (two) times daily before a meal.       . glucose blood (AGAMATRIX PRESTO TEST) test strip Use as instructed  100 each  12  . hydrochlorothiazide (HYDRODIURIL) 25 MG tablet Take 25 mg by mouth daily.      . hydrOXYzine (ATARAX/VISTARIL) 10 MG tablet Take 10 mg by mouth 3 (three) times daily as needed for itching.       . insulin aspart (NOVOLOG) 100 UNIT/ML injection Inject 10 Units into the skin once.  10 mL  3  . insulin glargine (LANTUS) 100 UNIT/ML injection Inject 0.7 mL (70 Units total) into the skin at bedtime.  10 mL    . lovastatin (MEVACOR) 40 MG tablet Take 1 tablet (40 mg total) by mouth at bedtime.  30 tablet  2  . metFORMIN (GLUCOPHAGE) 1000 MG tablet Take 1 tablet (1,000 mg total) by mouth 2 (two) times daily with a meal.  60 tablet  2  . omeprazole (PRILOSEC) 20 MG capsule Take 20 mg by mouth daily.      Marland Kitchen oxyCODONE-acetaminophen (PERCOCET) 10-325 MG per tablet Take 1 tablet by mouth every 4 (four) hours as needed for pain.  30 tablet  0  . oxyCODONE-acetaminophen (PERCOCET/ROXICET) 5-325 MG per tablet Take 2 tablets by mouth every 6 (six) hours as needed for pain.  15 tablet  0  . Pancrelipase, Lip-Prot-Amyl, 36000 UNITS CPEP Take 1 capsule by mouth  3 (three) times daily.      . pantoprazole (PROTONIX) 40 MG tablet Take 1 tablet (40 mg total) by mouth daily.  90 tablet  0   No current facility-administered medications for this visit.   Family History  Problem Relation Age of Onset  . Colon cancer Neg Hx   . Stomach cancer Neg Hx   . Anesthesia problems Neg Hx   . Hypotension Neg Hx   . Pseudochol deficiency Neg Hx   . Malignant hyperthermia Neg Hx   . Hypertension Sister   . Diabetes Sister   . Heart disease Sister    History   Social History  . Marital Status: Single    Spouse Name: N/A    Number of Children: 0  . Years of Education: 11   Occupational History  .    [employer]    worked for 6 years  . 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 maintenance [employer]   Social History  Main Topics  . Smoking status: Current Every Day Smoker -- 0.20 packs/day for 30 years    Types: Cigarettes  . Smokeless tobacco: Never Used     Comment: 4 -5 cigs/day.Wants to quit.  . Alcohol Use: No     Comment: sobriety since 2/13,was drinking 7-10 beer/daily  . Drug Use: None     Comment: Feb. 2013 last use  . Sexual Activity: None   Other Topics Concern  . None   Social History Narrative   Has been living at 3 different friend's homes since discharge 01/2011. Was living in the shelter prior to admission. Had previously lived with his niece and other family members but he has gotten kicked out each time.    Review of Systems: Otherwise negative unless listed in history of present illness  Objective:  Physical Exam: Filed Vitals:   09/13/12 1419  BP: 113/73  Pulse: 91  Temp: 97.8 F (36.6 C)  TempSrc: Oral  Height: 5\' 3"  (1.6 m)  Weight: 218 lb 12.8 oz (99.247 kg)  SpO2: 98%   General: sitting in chair, NAD HEENT: PERRL, EOMI, no scleral icterus Cardiac: RRR, no rubs, murmurs or gallops Pulm: clear to auscultation bilaterally, moving normal volumes of air Abd: soft, nontender, nondistended, BS present Ext: warm and well perfused, no pedal edema Neuro: alert and oriented X3, cranial nerves II-XII grossly intact   Assessment & Plan:   1. DM: Patient is single.A1c was 12.2 in 8/14 this has been a chronic frustrating problem with the patient given his Limited literacy and inability to adhere to complicated insulin regimens this is thus complicated by his chronic pancreatitis that is now not managed by a GI physician given his dismissal from the gastrointestinal clinic for his failed drug screening panel. -Will begin NovoLog 10 units during lunch with the goal of hopefully titrating to 3 times a day once patient is able to understand and be compliant with this plan. Patient has also had significant weight gain over the past year of approximately 20 pounds per chart review  this is a result of being connected to the food pantry.  -Decrease Lantus to 60 units each bedtime (see this was originally 29) -Followup with diabetes educator in one week for titration of NovoLog -extensive education in terms of weight loss and meal size was discussed with the patient  2.chronic pancreatitis: The patient has now been dismissed from the GI clinic for his fields compliance to a controlled  medication contract with Dr. Leone Payor.the patient has also violated a pain contract at the [facility] and therefore this provider can no longer give narcotics. -Referral to pain clinic  Patient was discussed with Dr. Josem Kaufmann

## 2012-09-13 NOTE — Telephone Encounter (Signed)
Talked to Dr Burtis Junes - will start pt at once daily of Novolog as prescribed.  [facility] pharmacy called/ informed.

## 2012-09-16 ENCOUNTER — Encounter: Payer: Self-pay | Admitting: Licensed Clinical Social Worker

## 2012-09-16 NOTE — Telephone Encounter (Signed)
Rx called in to pharmacy. 

## 2012-09-16 NOTE — Progress Notes (Signed)
Patient ID: Michael [name], male   DOB: 07/10/1955, 57 y.o.   MRN: [id number] CSW completed referral to [facility] to enroll [name] in [facility] medical transportation.  In addition, CSW will provide [place] [employer] Enrollment form for [name].  CSW will provide information on [facility] services available once pt is enrolled in [place] [employer] [place] plan.

## 2012-09-17 ENCOUNTER — Encounter: Payer: Self-pay | Admitting: Licensed Clinical Social Worker

## 2012-09-17 NOTE — Progress Notes (Signed)
Case discussed with Dr. Sadek immediately after the visit. We reviewed the resident's history and exam and pertinent patient test results. I agree with the assessment, diagnosis and plan of care documented in the resident's note. 

## 2012-09-17 NOTE — Progress Notes (Signed)
Michael [name] presents to [facility] today to provide Medicaid Card.  Pt's insurance card does list [facility] as PCP.  CSW discussed referral and services available through [facility],pt in agreement with referral.  Pt inquiring about GI referral and Pain Clinic referral.  CSW informed Michael [name] nursing has submitted referral for GI, awaiting accepting clinic.  Pain Clinic will make their own determination if pt is accepted into program.

## 2012-09-18 ENCOUNTER — Emergency Department (HOSPITAL_COMMUNITY)
Admission: EM | Admit: 2012-09-18 | Discharge: 2012-09-18 | Disposition: A | Discharge status: Home / Self Care | Payer: Medicaid Other | Attending: Emergency Medicine | Admitting: Emergency Medicine

## 2012-09-18 ENCOUNTER — Encounter (HOSPITAL_COMMUNITY): Payer: Self-pay | Admitting: *Deleted

## 2012-09-18 DIAGNOSIS — F3289 Other specified depressive episodes: Secondary | ICD-10-CM | POA: Insufficient documentation

## 2012-09-18 DIAGNOSIS — Z8619 Personal history of other infectious and parasitic diseases: Secondary | ICD-10-CM | POA: Insufficient documentation

## 2012-09-18 DIAGNOSIS — Z9889 Other specified postprocedural states: Secondary | ICD-10-CM | POA: Insufficient documentation

## 2012-09-18 DIAGNOSIS — K861 Other chronic pancreatitis: Secondary | ICD-10-CM | POA: Insufficient documentation

## 2012-09-18 DIAGNOSIS — Z8679 Personal history of other diseases of the circulatory system: Secondary | ICD-10-CM | POA: Insufficient documentation

## 2012-09-18 DIAGNOSIS — Z794 Long term (current) use of insulin: Secondary | ICD-10-CM | POA: Insufficient documentation

## 2012-09-18 DIAGNOSIS — Z8719 Personal history of other diseases of the digestive system: Secondary | ICD-10-CM | POA: Insufficient documentation

## 2012-09-18 DIAGNOSIS — Z860100 Personal history of colon polyps, unspecified: Secondary | ICD-10-CM | POA: Insufficient documentation

## 2012-09-18 DIAGNOSIS — F329 Major depressive disorder, single episode, unspecified: Secondary | ICD-10-CM | POA: Insufficient documentation

## 2012-09-18 DIAGNOSIS — Z7982 Long term (current) use of aspirin: Secondary | ICD-10-CM | POA: Insufficient documentation

## 2012-09-18 DIAGNOSIS — Z79899 Other long term (current) drug therapy: Secondary | ICD-10-CM | POA: Insufficient documentation

## 2012-09-18 DIAGNOSIS — R109 Unspecified abdominal pain: Secondary | ICD-10-CM

## 2012-09-18 DIAGNOSIS — R1012 Left upper quadrant pain: Secondary | ICD-10-CM | POA: Insufficient documentation

## 2012-09-18 DIAGNOSIS — F17200 Nicotine dependence, unspecified, uncomplicated: Secondary | ICD-10-CM | POA: Insufficient documentation

## 2012-09-18 DIAGNOSIS — G4700 Insomnia, unspecified: Secondary | ICD-10-CM | POA: Insufficient documentation

## 2012-09-18 DIAGNOSIS — I10 Essential (primary) hypertension: Secondary | ICD-10-CM | POA: Insufficient documentation

## 2012-09-18 DIAGNOSIS — E119 Type 2 diabetes mellitus without complications: Secondary | ICD-10-CM | POA: Insufficient documentation

## 2012-09-18 LAB — COMPREHENSIVE METABOLIC PANEL
ALT: 19 U/L (ref 0–53)
AST: 17 U/L (ref 0–37)
Alkaline Phosphatase: 120 U/L — ABNORMAL HIGH (ref 39–117)
CO2: 25 mEq/L (ref 19–32)
GFR calc Af Amer: >90 mL/min (ref >90)
Glucose, Bld: 245 mg/dL — ABNORMAL HIGH (ref 70–99)
Potassium: 4 mEq/L (ref 3.5–5.1)
Sodium: 137 mEq/L (ref 135–145)
Total Protein: 7.1 g/dL (ref 6.0–8.3)

## 2012-09-18 LAB — CBC WITH DIFFERENTIAL/PLATELET
Basophils Absolute: 0 10*3/uL (ref 0.0–0.1)
Eosinophils Absolute: 0.5 10*3/uL (ref 0.0–0.7)
Lymphocytes Relative: 21 % (ref 12–46)
Lymphs Abs: 2.3 10*3/uL (ref 0.7–4.0)
Neutrophils Relative %: 68 % (ref 43–77)
Platelets: 216 10*3/uL (ref 150–400)
RBC: 5.23 MIL/uL (ref 4.22–5.81)
WBC: 11.3 10*3/uL — ABNORMAL HIGH (ref 4.0–10.5)

## 2012-09-18 MED ORDER — OXYCODONE-ACETAMINOPHEN 5-325 MG PO TABS: 1.0000 | ORAL_TABLET | Freq: Four times a day (QID) | ORAL | Status: DC | PRN

## 2012-09-18 MED ORDER — SODIUM CHLORIDE 0.9 % IV BOLUS (SEPSIS)
1000.0000 mL | INTRAVENOUS | Status: AC
  Administered 2012-09-18: 1000 mL via INTRAVENOUS

## 2012-09-18 MED ORDER — HYDROMORPHONE HCL PF 1 MG/ML IJ SOLN
1.0000 mg | INTRAMUSCULAR | Status: AC
  Administered 2012-09-18: 1 mg via INTRAVENOUS
  Filled 2012-09-18: qty 1

## 2012-09-18 NOTE — ED Notes (Signed)
Pt in c/o chronic abd pain, states he is seen by a pain clinic for this but hasn't been able to get in touch with them for his pain management in a few week, denies new symptoms, states he has the same pain everyday and this pain is not different, denies n/v, states he is out of pain medication at home

## 2012-09-18 NOTE — ED Provider Notes (Addendum)
CSN: [id number]     Arrival date & time 09/18/12  6213 History   First MD Initiated Contact with Patient 09/18/12 1014     Chief Complaint  Patient presents with  . Abdominal Pain   (Consider location/radiation/quality/duration/timing/severity/associated sxs/prior Treatment) Patient is a 57 y.o. male presenting with abdominal pain. The history is provided by the patient.  Abdominal Pain Pain location:  LUQ Pain quality: aching   Pain radiates to:  Back Pain severity:  Moderate Onset quality:  Gradual Timing:  Constant Progression:  Unchanged Chronicity:  Chronic Relieved by:  Nothing Worsened by:  Nothing tried Ineffective treatments:  None tried Associated symptoms: no chest pain, no cough, no diarrhea, no dysuria, no fever, no hematuria, no nausea, no shortness of breath and no vomiting     Past Medical History  Diagnosis Date  . Chronic pancreatitis   . Smoker   . Gastritis due to alcohol without hemorrhage   . HTN (hypertension)   . Pancreatic lesion     appears innocent and related to chronic pancreatitis  . Heavy alcohol use     hx of  . Marijuana use     hx of  . Migraine   . Depression   . Helicobacter pylori gastritis 03/27/2011    on endoscopy, treated with omeprazole, clarithromycin and amoxicillin  . Insomnia   . ACE inhibitor-aggravated angioedema   . Sessile colonic polyp   . Diabetes mellitus   . GERD (gastroesophageal reflux disease)   . Chronic abdominal pain on narcotics 06/27/2012   Past Surgical History  Procedure Laterality Date  . Esophagogastroduodenoscopy  03/22/2011    Procedure: ESOPHAGOGASTRODUODENOSCOPY (EGD);  Surgeon: [name], MD;  Location: [facility];  Service: Endoscopy;  Laterality: N/A;  egd with balloon   . Balloon dilation  03/22/2011    Procedure: BALLOON DILATION;  Surgeon: [name], MD;  Location: [facility] END[facility]COPY;  Service: Endoscopy;  Laterality: N/A;  . Eus  04/20/2011    Procedure: UPPER ENDOSCOPIC ULTRASOUND (EUS)  LINEAR;  Surgeon: Rachael Fee, MD;  Location: [facility] END[facility];  Service: Endoscopy;  Laterality: N/A;  . Colonoscopy w/ biopsies and polypectomy  09/12/11  . Colonoscopy N/A 05/22/2012    Procedure: COLONOSCOPY;  Surgeon: [name], MD;  Location: [facility] ENDOSCOPY;  Service: Endoscopy;  Laterality: N/A;   Family History  Problem Relation Age of Onset  . Colon cancer Neg Hx   . Stomach cancer Neg Hx   . Anesthesia problems Neg Hx   . Hypotension Neg Hx   . Pseudochol deficiency Neg Hx   . Malignant hyperthermia Neg Hx   . Hypertension Sister   . Diabetes Sister   . Heart disease Sister    History  Substance Use Topics  . Smoking status: Current Every Day Smoker -- 0.20 packs/day for 30 years    Types: Cigarettes  . Smokeless tobacco: Never Used     Comment: 4 -5 cigs/day.Wants to quit.  . Alcohol Use: No     Comment: sobriety since 2/13,was drinking 7-10 beer/daily    Review of Systems  Constitutional: Negative for fever.  HENT: Negative for rhinorrhea, drooling and neck pain.   Eyes: Negative for pain.  Respiratory: Negative for cough and shortness of breath.   Cardiovascular: Negative for chest pain and leg swelling.  Gastrointestinal: Positive for abdominal pain. Negative for nausea, vomiting and diarrhea.  Genitourinary: Negative for dysuria and hematuria.  Musculoskeletal: Negative for gait problem.  Skin: Negative for color change.  Neurological: Negative for numbness and headaches.  Hematological: Negative for adenopathy.  Psychiatric/Behavioral: Negative for behavioral problems.  All other systems reviewed and are negative.    Allergies  Ace inhibitors and Losartan  Home Medications   Current Outpatient Rx  Name  Route  Sig  Dispense  Refill  . ARIPiprazole (ABILIFY) 15 MG tablet   Oral   Take 22 mg by mouth at bedtime. Takes 15mg  tab, a 5mg  tab, and a 2mg  tab for 22mg  nightly         . aspirin 81 MG tablet   Oral   Take 1 tablet (81 mg total) by  mouth daily.   30 tablet   4   . FLUoxetine (PROZAC) 20 MG capsule   Oral   Take 40 mg by mouth daily.          Marland Kitchen glipiZIDE (GLUCOTROL) 10 MG tablet   Oral   Take 10 mg by mouth 2 (two) times daily before a meal.         . glucose blood (AGAMATRIX PRESTO TEST) test strip      Use as instructed   100 each   12   . hydrochlorothiazide (HYDRODIURIL) 25 MG tablet   Oral   Take 25 mg by mouth daily.         . hydrOXYzine (ATARAX/VISTARIL) 10 MG tablet   Oral   Take 10 mg by mouth 3 (three) times daily as needed for itching.          . insulin aspart (NOVOLOG) 100 UNIT/ML injection   Subcutaneous   Inject 10 Units into the skin once.   10 mL   3   . insulin glargine (LANTUS) 100 UNIT/ML injection   Subcutaneous   Inject 0.7 mL (70 Units total) into the skin at bedtime.   10 mL      . lovastatin (MEVACOR) 40 MG tablet   Oral   Take 1 tablet (40 mg total) by mouth at bedtime.   30 tablet   2   . metFORMIN (GLUCOPHAGE) 1000 MG tablet   Oral   Take 1 tablet (1,000 mg total) by mouth 2 (two) times daily with a meal.   60 tablet   2   . omeprazole (PRILOSEC) 20 MG capsule   Oral   Take 20 mg by mouth daily.         Marland Kitchen oxyCODONE-acetaminophen (PERCOCET) 10-325 MG per tablet   Oral   Take 1 tablet by mouth every 4 (four) hours as needed for pain.   30 tablet   0   . oxyCODONE-acetaminophen (PERCOCET/ROXICET) 5-325 MG per tablet   Oral   Take 2 tablets by mouth every 6 (six) hours as needed for pain.   15 tablet   0   . Pancrelipase, Lip-Prot-Amyl, 36000 UNITS CPEP   Oral   Take 1 capsule by mouth 3 (three) times daily.         . pantoprazole (PROTONIX) 40 MG tablet   Oral   Take 1 tablet (40 mg total) by mouth daily.   90 tablet   0    BP 129/84  Pulse 84  Temp(Src) 98.5 F (36.9 C) (Oral)  Resp 20  Wt 218 lb (98.884 kg)  BMI 38.63 kg/m2  SpO2 100% Physical Exam  Nursing note and vitals reviewed. Constitutional: He is oriented to  person, place, and time. He appears well-developed and well-nourished.  HENT:  Head: Normocephalic and atraumatic.  Right Ear: External ear  normal.  Left Ear: External ear normal.  Nose: Nose normal.  Mouth/Throat: Oropharynx is clear and moist. No oropharyngeal exudate.  Eyes: Conjunctivae and EOM are normal. Pupils are equal, round, and reactive to light.  Neck: Normal range of motion. Neck supple.  Cardiovascular: Normal rate, regular rhythm, normal heart sounds and intact distal pulses.  Exam reveals no gallop and no friction rub.   No murmur heard. Pulmonary/Chest: Effort normal and breath sounds normal. No respiratory distress. He has no wheezes.  Abdominal: Soft. Bowel sounds are normal. He exhibits no distension. There is tenderness (mild luq ttp on exam). There is no rebound and no guarding.  Musculoskeletal: Normal range of motion. He exhibits no edema and no tenderness.  Neurological: He is alert and oriented to person, place, and time.  Skin: Skin is warm and dry.  Psychiatric: He has a normal mood and affect. His behavior is normal.    ED Course  Procedures (including critical care time) Labs Review Labs Reviewed  CBC WITH DIFFERENTIAL - Abnormal; Notable for the following:    WBC 11.3 (*)    Hemoglobin 17.1 (*)    All other components within normal limits  COMPREHENSIVE METABOLIC PANEL - Abnormal; Notable for the following:    Glucose, Bld 245 (*)    Albumin 3.4 (*)    Alkaline Phosphatase 120 (*)    All other components within normal limits  LIPASE, BLOOD   Imaging Review No results found.  MDM   1. Abdominal pain    12:08 PM 57 y.o. male w hx of pancreatitis, chr abd pain who pw ongoing abd pain. Pt notes it is cw his pancreatitis pain, denies new sx including vomiting, fever, diarrhea. Pt AFVSS here. Will get labs, 1 dose of pain medicine. Pt states he has f/u w/ chronic pain clinic, but no set appt. Has been warned not to use the ED for pain mgmt previously.    12:40 PM: I again warned the pt the ED should not be utilized for his chronic pain mgmt. He understands.  I have discussed the diagnosis/risks/treatment options with the patient and believe the pt to be eligible for discharge home to follow-up at his chronic pain clinic. We also discussed returning to the ED immediately if new or worsening sx occur. We discussed the sx which are most concerning (e.g., vomiting, fever, worsening pain) that necessitate immediate return. Any new prescriptions provided to the patient are listed below.  Discharge Medication List as of 09/18/2012 12:42 PM    START taking these medications   Details  !! oxyCODONE-acetaminophen (PERCOCET) 5-325 MG per tablet Take 1 tablet by mouth every 6 (six) hours as needed for pain., Starting 09/18/2012, Until Discontinued, Print     !! - Potential duplicate medications found. Please discuss with provider.       Junius Argyle, MD 09/18/12 1926  Junius Argyle, MD 09/18/12 [phone]

## 2012-09-24 ENCOUNTER — Ambulatory Visit (INDEPENDENT_AMBULATORY_CARE_PROVIDER_SITE_OTHER): Payer: Medicaid Other | Admitting: Dietician

## 2012-09-24 VITALS — Wt 215.7 lb

## 2012-09-24 DIAGNOSIS — IMO0002 Reserved for concepts with insufficient information to code with codable children: Secondary | ICD-10-CM

## 2012-09-24 DIAGNOSIS — IMO0001 Reserved for inherently not codable concepts without codable children: Secondary | ICD-10-CM

## 2012-09-24 DIAGNOSIS — E1165 Type 2 diabetes mellitus with hyperglycemia: Secondary | ICD-10-CM

## 2012-09-24 NOTE — Progress Notes (Signed)
Discussed blood sugars with Dr. Burtis Junes over phone; she instructed patient to decrease his lantus to 50 units and start a second injection of 10 units before his evening meal. Meter download put in Dr. Caleen Jobs box.   Diabetes Self-Management Education  Visit Number: Follow-up 2  09/24/2012 [name], identified by name and date of birth, is a 57 y.o. male with Type 2 diabetes  ASSESSMENT  Patient Concerns:     Weight 215 lb 11.2 oz (97.841 kg). Body mass index is 38.22 kg/(m^2).  Lab Results: LDL Cholesterol  Date Value Range Status  06/14/2012  89 (direct LDL)  Final           Hemoglobin A1C  Date Value Range Status  09/03/2012 12.2   Final  11/13/2010 6.7* <5.7 % Final     (NOTE)                                                                               Family History  Problem Relation Age of Onset  . Colon cancer Neg Hx   . Stomach cancer Neg Hx   . Anesthesia problems Neg Hx   . Hypotension Neg Hx   . Pseudochol deficiency Neg Hx   . Malignant hyperthermia Neg Hx   . Hypertension Sister   . Diabetes Sister   . Heart disease Sister    History  Substance Use Topics  . Smoking status: Current Every Day Smoker -- 0.20 packs/day for 30 years    Types: Cigarettes  . Smokeless tobacco: Never Used     Comment: 4 -5 cigs/day.Wants to quit.  . Alcohol Use: No     Comment: sobriety since 2/13,was drinking 7-10 beer/daily      Assessment comments: patient doing well taking 10 units Novolog most days with am meal. Ate too small of meal on a few days - on those days blood sugar dropped to mid to low 100s. Otherwise, fastings are now 241-410 with average 283 overall and 13 % in target compared to average of 399 and 0% in target prior to Novolog initiation. Has decreased weight by 2.3#   Diet Recall: more lean meats, vegetables  Individualized Plan for Diabetes Self-Management Training:  Patient individualized diabetes plan discussed today with patient and  includes:  Nutrition Medications monitoring  how to handle highs and lows    Education Topics Reviewed with Patient Today:  Topic Points Discussed  Disease State    Nutrition Management    Physical Activity and Exercise    Medications  reemphasized action of different types of insulin. Also the importance of taking it with enough food and before the meal  Monitoring Discussed target blood sugars 90-180 for most and < 70 is too low.   Acute Complications    Chronic Complications    Psychosocial Adjustment    Goal Setting    Preconception Care (if applicable)      PATIENTS GOALS   Learning Objective(s):   understand when o take medicine and how to treat a low blood sugar  Goal The patient agrees to:  Nutrition    Physical Activity    Medications take my medication as prescribed  Monitoring  Problem Solving    Reducing Risk  always carry treatment for low blood sugar  Health Coping     Patient Self-Evaluation of Goals (Subsequent Visits)  Goal The patient rates self as meeting goals (% of time)  Nutrition 50 - 75 %  Physical Activity    Medications >75%  Monitoring    Problem Solving    Reducing Risk    Health Coping       PERSONALIZED PLAN / SUPPORT  Self-Management Support:  Doctor's office;CDE visits ______________________________________________________________________  Outcomes  Expected Outcomes:  Demonstrated interest in learning. Expect positive outcomes Self-Care Barriers:  Lack of material resources Education material provided: yes If problems or questions, patient to contact team via:  Phone Time in: 1330     Time out: 1400 Future DSME appointment: -  1 week with CDE per Dr. Burtis Junes   Plyler, Lupita Leash 09/24/2012 3:29 PM

## 2012-09-24 NOTE — Patient Instructions (Addendum)
Dr. Burtis Junes wants you to start taking 2 doses of Novolog 10 units;   1 right before your morning meal and   the other right before your evening meal.  Also she wants you to Decrease your lantus to 50 units tonight.   Please make a follow up with me in 1 week (about September 9th)

## 2012-09-26 ENCOUNTER — Other Ambulatory Visit: Payer: Self-pay | Admitting: *Deleted

## 2012-09-26 DIAGNOSIS — E1165 Type 2 diabetes mellitus with hyperglycemia: Secondary | ICD-10-CM

## 2012-09-26 DIAGNOSIS — IMO0002 Reserved for concepts with insufficient information to code with codable children: Secondary | ICD-10-CM

## 2012-09-30 ENCOUNTER — Telehealth: Payer: Self-pay | Admitting: Dietician

## 2012-09-30 MED ORDER — INSULIN GLARGINE 100 UNIT/ML ~~LOC~~ SOLN: 50.0000 [IU] | Freq: Every day | SUBCUTANEOUS | Status: DC

## 2012-09-30 NOTE — Telephone Encounter (Signed)
Patient called and request 2 things: 1- that Venita Sheffield be notified that Annabelle Harman from the [facility] pain clinic needs more information from his PCP before he can be seen there 2- MAP needs prescription for insulin

## 2012-10-01 ENCOUNTER — Ambulatory Visit (INDEPENDENT_AMBULATORY_CARE_PROVIDER_SITE_OTHER): Payer: Medicaid Other | Admitting: Dietician

## 2012-10-01 ENCOUNTER — Encounter (HOSPITAL_COMMUNITY): Payer: Self-pay

## 2012-10-01 ENCOUNTER — Emergency Department (HOSPITAL_COMMUNITY)
Admission: EM | Admit: 2012-10-01 | Discharge: 2012-10-01 | Disposition: A | Discharge status: Home / Self Care | Payer: Medicaid Other | Attending: Emergency Medicine | Admitting: Emergency Medicine

## 2012-10-01 VITALS — Wt 218.0 lb

## 2012-10-01 DIAGNOSIS — Z8669 Personal history of other diseases of the nervous system and sense organs: Secondary | ICD-10-CM | POA: Insufficient documentation

## 2012-10-01 DIAGNOSIS — Z794 Long term (current) use of insulin: Secondary | ICD-10-CM | POA: Insufficient documentation

## 2012-10-01 DIAGNOSIS — Z8619 Personal history of other infectious and parasitic diseases: Secondary | ICD-10-CM | POA: Insufficient documentation

## 2012-10-01 DIAGNOSIS — Z860100 Personal history of colon polyps, unspecified: Secondary | ICD-10-CM | POA: Insufficient documentation

## 2012-10-01 DIAGNOSIS — IMO0002 Reserved for concepts with insufficient information to code with codable children: Secondary | ICD-10-CM

## 2012-10-01 DIAGNOSIS — F3289 Other specified depressive episodes: Secondary | ICD-10-CM | POA: Insufficient documentation

## 2012-10-01 DIAGNOSIS — Z79899 Other long term (current) drug therapy: Secondary | ICD-10-CM | POA: Insufficient documentation

## 2012-10-01 DIAGNOSIS — IMO0001 Reserved for inherently not codable concepts without codable children: Secondary | ICD-10-CM

## 2012-10-01 DIAGNOSIS — E119 Type 2 diabetes mellitus without complications: Secondary | ICD-10-CM | POA: Insufficient documentation

## 2012-10-01 DIAGNOSIS — F329 Major depressive disorder, single episode, unspecified: Secondary | ICD-10-CM | POA: Insufficient documentation

## 2012-10-01 DIAGNOSIS — F17200 Nicotine dependence, unspecified, uncomplicated: Secondary | ICD-10-CM | POA: Insufficient documentation

## 2012-10-01 DIAGNOSIS — R1013 Epigastric pain: Secondary | ICD-10-CM | POA: Insufficient documentation

## 2012-10-01 DIAGNOSIS — E1165 Type 2 diabetes mellitus with hyperglycemia: Secondary | ICD-10-CM

## 2012-10-01 DIAGNOSIS — G8929 Other chronic pain: Secondary | ICD-10-CM | POA: Insufficient documentation

## 2012-10-01 DIAGNOSIS — K219 Gastro-esophageal reflux disease without esophagitis: Secondary | ICD-10-CM | POA: Insufficient documentation

## 2012-10-01 DIAGNOSIS — I10 Essential (primary) hypertension: Secondary | ICD-10-CM | POA: Insufficient documentation

## 2012-10-01 DIAGNOSIS — K861 Other chronic pancreatitis: Secondary | ICD-10-CM | POA: Insufficient documentation

## 2012-10-01 LAB — COMPREHENSIVE METABOLIC PANEL
ALT: 20 U/L (ref 0–53)
AST: 17 U/L (ref 0–37)
Alkaline Phosphatase: 121 U/L — ABNORMAL HIGH (ref 39–117)
CO2: 29 mEq/L (ref 19–32)
Calcium: 9.4 mg/dL (ref 8.4–10.5)
GFR calc non Af Amer: >90 mL/min (ref >90)
Glucose, Bld: 184 mg/dL — ABNORMAL HIGH (ref 70–99)
Potassium: 4 mEq/L (ref 3.5–5.1)
Sodium: 139 mEq/L (ref 135–145)
Total Protein: 7.2 g/dL (ref 6.0–8.3)

## 2012-10-01 LAB — CBC WITH DIFFERENTIAL/PLATELET
Basophils Absolute: 0 10*3/uL (ref 0.0–0.1)
Eosinophils Relative: 3 % (ref 0–5)
Lymphocytes Relative: 25 % (ref 12–46)
Lymphs Abs: 2.5 10*3/uL (ref 0.7–4.0)
MCV: 93.8 fL (ref 78.0–100.0)
Neutrophils Relative %: 64 % (ref 43–77)
Platelets: 214 10*3/uL (ref 150–400)
RBC: 5.13 MIL/uL (ref 4.22–5.81)
RDW: 14.3 % (ref 11.5–15.5)
WBC: 10.2 10*3/uL (ref 4.0–10.5)

## 2012-10-01 LAB — URINE MICROSCOPIC-ADD ON

## 2012-10-01 LAB — URINALYSIS, ROUTINE W REFLEX MICROSCOPIC
Bilirubin Urine: NEGATIVE
Glucose, UA: >1000 mg/dL — AB
Hgb urine dipstick: NEGATIVE
Specific Gravity, Urine: 1.03 (ref 1.005–1.030)

## 2012-10-01 MED ORDER — HYDROMORPHONE HCL PF 1 MG/ML IJ SOLN: 1.0000 mg | Freq: Once | INTRAMUSCULAR | Status: DC

## 2012-10-01 NOTE — ED Provider Notes (Signed)
I evaluated the patient with the resident physician, Dr. Beverely Pace. I agree with the documentation, and have seen the relevant studies, including ecg (if performed)  On my exam the patient was in no distress.  After a lengthy discussion by Dr. Beverely Pace regarding chronic pain, the patient requested discharge without additional evaluation.  This is reasonable, given the well-documented history of chronic abdominal pain, the absence of distress, stable vital signs.   Gerhard Munch, MD 10/01/12 2352

## 2012-10-01 NOTE — ED Notes (Signed)
Pt c/o pancreatic pain, pt states "I have chronic pancreatitis pain and I'm having severe pain." Pt also reports he is currently trying to find a pain clinic and a gastroenterologist. Pt denies N/V/D, chest pain, or SOB

## 2012-10-01 NOTE — ED Provider Notes (Signed)
CSN: [id number]     Arrival date & time 10/01/12  1539 History   First MD Initiated Contact with Patient 10/01/12 1730     Chief Complaint  Patient presents with  . Abdominal Pain   (Consider location/radiation/quality/duration/timing/severity/associated sxs/prior Treatment) HPI Comments: 57 y.o. male w hx of chronic pancreatitis, chronic abd pain who presents with ongoing abd pain. He has been able to tolerate PO. No vomiting or fevers.  Patient is a 57 y.o. male presenting with abdominal pain.  Abdominal Pain Pain location:  Epigastric Pain quality: aching   Pain radiates to:  Does not radiate Pain severity:  Moderate Duration: Years. Timing:  Constant Progression:  Worsening Chronicity:  Chronic Context: not alcohol use (He reports no alcohol use for 3 years), not diet changes, not laxative use, not sick contacts, not suspicious food intake and not trauma   Associated symptoms: no chest pain, no chills, no diarrhea, no dysuria, no fever, no nausea, no shortness of breath, no sore throat and no vomiting     Past Medical History  Diagnosis Date  . Chronic pancreatitis   . Smoker   . Gastritis due to alcohol without hemorrhage   . HTN (hypertension)   . Pancreatic lesion     appears innocent and related to chronic pancreatitis  . Heavy alcohol use     hx of  . Marijuana use     hx of  . Migraine   . Depression   . Helicobacter pylori gastritis 03/27/2011    on endoscopy, treated with omeprazole, clarithromycin and amoxicillin  . Insomnia   . ACE inhibitor-aggravated angioedema   . Sessile colonic polyp   . Diabetes mellitus   . GERD (gastroesophageal reflux disease)   . Chronic abdominal pain on narcotics 06/27/2012   Past Surgical History  Procedure Laterality Date  . Esophagogastroduodenoscopy  03/22/2011    Procedure: ESOPHAGOGASTRODUODENOSCOPY (EGD);  Surgeon: [name], MD;  Location: [facility];  Service: Endoscopy;  Laterality: N/A;  egd with balloon   .  Balloon dilation  03/22/2011    Procedure: BALLOON DILATION;  Surgeon: [name], MD;  Location: [facility]COPY;  Service: Endoscopy;  Laterality: N/A;  . Eus  04/20/2011    Procedure: UPPER ENDOSCOPIC ULTRASOUND (EUS) LINEAR;  Surgeon: Rachael Fee, MD;  Location: [facility];  Service: Endoscopy;  Laterality: N/A;  . Colonoscopy w/ biopsies and polypectomy  09/12/11  . Colonoscopy N/A 05/22/2012    Procedure: COLONOSCOPY;  Surgeon: [name], MD;  Location: [facility] ENDOSCOPY;  Service: Endoscopy;  Laterality: N/A;   Family History  Problem Relation Age of Onset  . Colon cancer Neg Hx   . Stomach cancer Neg Hx   . Anesthesia problems Neg Hx   . Hypotension Neg Hx   . Pseudochol deficiency Neg Hx   . Malignant hyperthermia Neg Hx   . Hypertension Sister   . Diabetes Sister   . Heart disease Sister    History  Substance Use Topics  . Smoking status: Current Every Day Smoker -- 0.20 packs/day for 30 years    Types: Cigarettes  . Smokeless tobacco: Never Used     Comment: 4 -5 cigs/day.Wants to quit.  . Alcohol Use: No     Comment: sobriety since 2/13,was drinking 7-10 beer/daily    Review of Systems  Constitutional: Negative for fever and chills.  HENT: Negative for sore throat.   Respiratory: Negative for shortness of breath.   Cardiovascular: Negative for chest pain and palpitations.  Gastrointestinal: Positive for abdominal pain. Negative for nausea, vomiting, diarrhea and blood in stool.  Genitourinary: Negative for dysuria and frequency.  Musculoskeletal: Negative for back pain.  All other systems reviewed and are negative.    Allergies  Ace inhibitors and Losartan  Home Medications   Current Outpatient Rx  Name  Route  Sig  Dispense  Refill  . ARIPiprazole (ABILIFY) 15 MG tablet   Oral   Take 15 mg by mouth at bedtime. Takes with 5 mg and 2 mg for a 22 mg dose         . ARIPiprazole (ABILIFY) 2 MG tablet   Oral   Take 2 mg by mouth at bedtime. Takes  with 15 mg and 5 mg for a 22 mg dose         . ARIPiprazole (ABILIFY) 5 MG tablet   Oral   Take 5 mg by mouth at bedtime. Takes with 15 mg and 2 mg for a 22 mg dose         . aspirin EC 81 MG tablet   Oral   Take 81 mg by mouth at bedtime.         Marland Kitchen FLUoxetine (PROZAC) 20 MG capsule   Oral   Take 20 mg by mouth 2 (two) times daily.          Marland Kitchen glipiZIDE (GLUCOTROL) 10 MG tablet   Oral   Take 10 mg by mouth 2 (two) times daily before a meal.         . hydrochlorothiazide (HYDRODIURIL) 25 MG tablet   Oral   Take 25 mg by mouth daily.         . hydrOXYzine (ATARAX/VISTARIL) 10 MG tablet   Oral   Take 10 mg by mouth daily as needed for itching.          . insulin aspart (NOVOLOG) 100 UNIT/ML injection   Subcutaneous   Inject 10 Units into the skin 2 (two) times daily with a meal.         . insulin glargine (LANTUS) 100 UNIT/ML injection   Subcutaneous   Inject 0.5 mLs (50 Units total) into the skin at bedtime.   10 mL   12   . lovastatin (MEVACOR) 40 MG tablet   Oral   Take 1 tablet (40 mg total) by mouth at bedtime.   30 tablet   2   . metFORMIN (GLUCOPHAGE) 500 MG tablet   Oral   Take 1,000 mg by mouth 2 (two) times daily with a meal.         . omeprazole (PRILOSEC) 20 MG capsule   Oral   Take 20 mg by mouth daily.         . Pancrelipase, Lip-Prot-Amyl, (CREON) 36000 UNITS CPEP   Oral   Take 36,000 Units by mouth 3 (three) times daily.         . pantoprazole (PROTONIX) 40 MG tablet   Oral   Take 1 tablet (40 mg total) by mouth daily.   90 tablet   0   . glucose blood (AGAMATRIX PRESTO TEST) test strip      Use as instructed   100 each   12    BP 114/83  Pulse 76  Temp(Src) 97.9 F (36.6 C) (Oral)  Resp 18  Ht 5\' 3"  (1.6 m)  Wt 215 lb (97.523 kg)  BMI 38.09 kg/m2  SpO2 97% Physical Exam  Vitals reviewed. Constitutional: He is oriented to person, place,  and time. He appears well-developed and well-nourished. No distress.   HENT:  Head: Normocephalic.  Right Ear: External ear normal.  Left Ear: External ear normal.  Nose: Nose normal.  Mouth/Throat: Oropharynx is clear and moist. No oropharyngeal exudate.  Eyes: Conjunctivae and EOM are normal. Pupils are equal, round, and reactive to light.  Neck: Normal range of motion. Neck supple.  Cardiovascular: Normal rate, regular rhythm, normal heart sounds and intact distal pulses.  Exam reveals no gallop and no friction rub.   No murmur heard. Pulmonary/Chest: Effort normal and breath sounds normal.  Abdominal: Soft. Bowel sounds are normal. He exhibits no distension. There is tenderness (Mild epigastric). There is no rebound, no guarding and no CVA tenderness.  Musculoskeletal: Normal range of motion. He exhibits no edema and no tenderness.  Neurological: He is alert and oriented to person, place, and time. No cranial nerve deficit.  Skin: Skin is warm and dry.  Psychiatric: He has a normal mood and affect.    ED Course  Procedures (including critical care time) Labs Review Labs Reviewed  CBC WITH DIFFERENTIAL - Abnormal; Notable for the following:    Hemoglobin 17.1 (*)    All other components within normal limits  COMPREHENSIVE METABOLIC PANEL - Abnormal; Notable for the following:    Glucose, Bld 184 (*)    Alkaline Phosphatase 121 (*)    All other components within normal limits  URINALYSIS, ROUTINE W REFLEX MICROSCOPIC - Abnormal; Notable for the following:    Glucose, UA >1000 (*)    All other components within normal limits  URINE MICROSCOPIC-ADD ON - Abnormal; Notable for the following:    Squamous Epithelial / LPF FEW (*)    All other components within normal limits  LIPASE, BLOOD   Imaging Review No results found.  MDM   57 y.o. male w hx of chronic pancreatitis, chronic abd pain who presents with ongoing abd pain. He has been able to tolerate PO. No vomiting or fevers.  Labs from triage are reassuring. His vital signs are all stable and  within normal limits. His abdominal exam is felt to be benign. On review the chart this patient has visited the emergency department 7 times in the past 6 months. This is his fourth visit in the last month for the same. He was provided with a prescription  for Percocet with instructions to follow up with his regular doctor during his last visit.After his medical screening exam I do not feel he has a life-threatening process at play. I offered to treat his pain while he was in the emergency department, but informed him that we would not be providing him with any prescriptions. He became loud and somewhat belligerent after I informed him of this. I again counseled him that we would attempt to bring his symptoms under reasonable control while he was in the emergency department. He was also counseled on the importance of follow up with his primary care doctor for ongoing management of his pain complaint.  After I left the room he put his clothes on and stated that he was regularly. He requested a bus pass to the may be transported to [facility] emergency department.    Clinical Impression: 1. Chronic abdominal pain     Disposition: Discharge  Condition: Good  I have discussed the results, Dx and Tx plan. They expressed understanding and agree with the plan and were told to return to ED with any worsening of condition or concern.    New  Prescriptions   No medications on file    Follow Up: Christen Bame, MD [address] [place] [address] [phone]  Schedule an appointment as soon as possible for a visit    Pt seen in conjunction with Dr. Jeraldine Loots.  Reine Just. Beverely Pace, MD Emergency Medicine PGY-III [phone]    Oleh Genin, MD 10/01/12 [phone]

## 2012-10-01 NOTE — Patient Instructions (Addendum)
Great job taking the [employer] twice daily!  Keep working on lowering your weight and eating less fat.   Please make a follow up appointment for 3-4 weeks  Fat and Cholesterol Control Diet Cholesterol is a wax-like substance. It comes from your liver and is found in certain foods. There is good (HDL) and bad (LDL) cholesterol. Too much cholesterol in your blood can affect your heart. Certain foods can lower or raise your cholesterol. Eat foods that are low in cholesterol. Saturated and trans fats are bad fats found in foods that will raise your cholesterol. Do not eat foods that are high in saturated and trans fats. FOODS HIGHER IN SATURATED AND TRANS FATS  Dairy products, such as whole milk, eggs, cheese, cream, and butter.  Fatty meats, such as hot dogs, sausage, and salami.  Fried foods.  Trans fats which are found in margarine and pre-made cookies, crackers, and baked goods.  Tropical oils, such as coconut and palm oils. Read package labels at the store. Do not buy products that use saturated or trans fats or hydrogenated oils. Find foods labeled:  Low-fat.  Low-saturated fat.  Trans-fat-free.  Low-cholesterol. FOODS LOWER IN CHOLESTEROL   Fruit.  Vegetables.  Beans, peas, and lentils.  Fish.  Lean meat, such as chicken (without skin) or ground [place].  Grains, such as barley, rice, couscous, bulgur wheat, and pasta.  Heart-healthy tub margarine. PREPARING YOUR FOOD  Broil, bake, steam, or roast foods. Do not fry food.  Use non-stick cooking sprays.  Use lemon or herbs to flavor food instead of using butter or stick margarine.  Use nonfat yogurt, salsa, or low-fat dressings for salads. Document Released: 07/11/2011 Document Reviewed: 07/11/2011 [facility] Patient Information 2014 [place], [place].

## 2012-10-02 NOTE — Telephone Encounter (Signed)
Spoke with patient and explained to him why the [facility] has declined our request for an appointment.   Office does not treat Pancreatitis pain.  Pt voiced understanding of and said that he had called office and spoke to someone there wjo said that same thing.  Pt was informed that referrals have been sent to [facility], [facility] and to Dr. Lolita Patella for an appointment.  Pt was also asked if he would be willing to go to [facility] or [facility] to see a GI doctor.  Pt stated that he would.  Angelina Ok, RN 10/01/2012 2:10 PM                                       ;10 PM

## 2012-10-02 NOTE — Progress Notes (Signed)
Diabetes Self-Management Education  Visit Number: Follow-up 3  10/02/2012 [name], identified by name and date of birth, is a 57 y.o. male with Type 2 diabetes  .       ASSESSMENT  Patient Concerns:     Weight 218 lb (98.884 kg). Body mass index is 38.63 kg/(m^2).  Lab Results: LDL Cholesterol  Date Value Range Status  06/14/2012  89  Final           Hemoglobin A1C  Date Value Range Status  09/03/2012 12.2   Final  11/13/2010 6.7* <5.7 % Final     (NOTE)                                                                               Family History  Problem Relation Age of Onset  . Colon cancer Neg Hx   . Stomach cancer Neg Hx   . Anesthesia problems Neg Hx   . Hypotension Neg Hx   . Pseudochol deficiency Neg Hx   . Malignant hyperthermia Neg Hx   . Hypertension Sister   . Diabetes Sister   . Heart disease Sister    History  Substance Use Topics  . Smoking status: Current Every Day Smoker -- 0.20 packs/day for 30 years    Types: Cigarettes  . Smokeless tobacco: Never Used     Comment: 4 -5 cigs/day.Wants to quit.  . Alcohol Use: No     Comment: sobriety since 2/13,was drinking 7-10 beer/daily       Assessment comments: blood sugar much improved, now between 119 and 300 instead of 250-650, average  For past 30 days is 306, but trend is much improved. Meter download in Dr. Caleen Jobs box for her review. Patient was giving glucose tablets today and reminded to always carry fast acting sugar with him and to always eat a neal after taking Novolog    Individualized Plan for Diabetes Self-Management Training:  Patient individualized diabetes plan discussed today with patient and includes: Nutrition, medications, monitoring, how to handle highs and lows,  Education Topics Reviewed with Patient Today:  Topic Points Discussed  Disease State    Nutrition Management    Physical Activity and Exercise    Medications    Monitoring Identified appropriate SMBG and A1C  goals.  Acute Complications Taught treatment of hypoglycemia - the 15 rule.  Chronic Complications    Psychosocial Adjustment    Goal Setting    Preconception Care (if applicable)      PATIENTS GOALS   Learning Objective(s):     Goal The patient agrees to:  Nutrition    Physical Activity    Medications    Monitoring    Problem Solving    Reducing Risk    Health Coping     Patient Self-Evaluation of Goals (Subsequent Visits)  Goal The patient rates self as meeting goals (% of time)  Nutrition 50 - 75 %  Physical Activity    Medications >75%  Monitoring    Problem Solving    Reducing Risk < 25%  Health Coping       PERSONALIZED PLAN / SUPPORT  Self-Management Support:  Doctor's office;CDE visits ______________________________________________________________________  Outcomes  Expected  Outcomes:  Demonstrated interest in learning. Expect positive outcomes Self-Care Barriers:  Lack of transportation Education material provided: yes If problems or questions, patient to contact team via:  Phone Time in: 1430     Time out: 1500 Future DSME appointment: - 4-6 wks   Plyler, Lupita Leash 10/02/2012 6:17 PM

## 2012-10-03 ENCOUNTER — Other Ambulatory Visit: Payer: Self-pay | Admitting: *Deleted

## 2012-10-03 ENCOUNTER — Telehealth: Payer: Self-pay | Admitting: Licensed Clinical Social Worker

## 2012-10-03 NOTE — Telephone Encounter (Signed)
Pt has been using lantus solostar not vials.  I called in for the pens.

## 2012-10-03 NOTE — Telephone Encounter (Signed)
Michael [name] placed call to CSW.  Michael [name] inquiring as to where he has been referred for pain.  Michael [name] states nursing told him referral may need to go beyond [facility] to search for providers.  CSW deferred Michael [name] back to nursing to discuss referrals.  CSW informed Michael [name] physician did place the referral for a pain specialist.  Michael [name] states he has been utilizing ED for when he is in pain and does not want to continue using the ED and needs a pain specialist to prescribe ongoing pain medication.  Michael [name] complains ED does not always provide a prescription for pain medication to be filled at a later time, but addresses the immediate pain.  Nursing has been in contact with Michael [name] and has explained the referral process.  Michael [name] also indicated he may need a new meter to check his blood glucose; one that is covered under medicaid.  Michael [name] states medicaid does not cover the strips for his current meter.  Michael [name] states his total copayment for medication is $12 and can not afford that at this time, inquiring if CSW is aware of any programs that can assist with copayment.  At this time, CSW is not aware of any program that will assist with the medicaid copayment for prescriptions.  Michael [name] states he will contact his Recruitment consultant.

## 2012-10-07 ENCOUNTER — Other Ambulatory Visit: Payer: Self-pay | Admitting: Dietician

## 2012-10-07 DIAGNOSIS — IMO0002 Reserved for concepts with insufficient information to code with codable children: Secondary | ICD-10-CM

## 2012-10-07 DIAGNOSIS — E1165 Type 2 diabetes mellitus with hyperglycemia: Secondary | ICD-10-CM

## 2012-10-07 NOTE — Telephone Encounter (Signed)
Patient requests meter and supplies that will be covered by [facility] Medicaid.

## 2012-10-08 ENCOUNTER — Telehealth: Payer: Self-pay | Admitting: Internal Medicine

## 2012-10-08 NOTE — Telephone Encounter (Signed)
Rec'd faxed confirmation from the [facility] that this patient is scheduled for 11/12/2012 at 9:30am at the [facility] location of [address] [place], [address] [place] [address] number [phone] and fax number [phone].  Please contact "Jan if any questions.

## 2012-10-09 MED ORDER — ACCU-CHEK NANO SMARTVIEW W/DEVICE KIT: 1.0000 | PACK | Freq: Three times a day (TID) | Status: DC

## 2012-10-09 MED ORDER — ACCU-CHEK FASTCLIX LANCETS MISC: 1.0000 | Freq: Three times a day (TID) | Status: DC

## 2012-10-09 MED ORDER — GLUCOSE BLOOD VI STRP: ORAL_STRIP | Status: DC

## 2012-10-11 ENCOUNTER — Other Ambulatory Visit: Payer: Self-pay | Admitting: *Deleted

## 2012-10-11 ENCOUNTER — Ambulatory Visit (INDEPENDENT_AMBULATORY_CARE_PROVIDER_SITE_OTHER): Payer: Medicaid Other | Admitting: *Deleted

## 2012-10-11 DIAGNOSIS — Z23 Encounter for immunization: Secondary | ICD-10-CM

## 2012-10-11 NOTE — Telephone Encounter (Signed)
Refill done.  

## 2012-10-21 ENCOUNTER — Emergency Department (HOSPITAL_COMMUNITY)
Admission: EM | Admit: 2012-10-21 | Discharge: 2012-10-21 | Disposition: A | Discharge status: Home / Self Care | Payer: Medicaid Other | Attending: Emergency Medicine | Admitting: Emergency Medicine

## 2012-10-21 ENCOUNTER — Encounter (HOSPITAL_COMMUNITY): Payer: Self-pay

## 2012-10-21 DIAGNOSIS — Z794 Long term (current) use of insulin: Secondary | ICD-10-CM | POA: Insufficient documentation

## 2012-10-21 DIAGNOSIS — F3289 Other specified depressive episodes: Secondary | ICD-10-CM | POA: Insufficient documentation

## 2012-10-21 DIAGNOSIS — R197 Diarrhea, unspecified: Secondary | ICD-10-CM | POA: Insufficient documentation

## 2012-10-21 DIAGNOSIS — Z7982 Long term (current) use of aspirin: Secondary | ICD-10-CM | POA: Insufficient documentation

## 2012-10-21 DIAGNOSIS — K219 Gastro-esophageal reflux disease without esophagitis: Secondary | ICD-10-CM | POA: Insufficient documentation

## 2012-10-21 DIAGNOSIS — I10 Essential (primary) hypertension: Secondary | ICD-10-CM | POA: Insufficient documentation

## 2012-10-21 DIAGNOSIS — R1012 Left upper quadrant pain: Secondary | ICD-10-CM | POA: Insufficient documentation

## 2012-10-21 DIAGNOSIS — Z860100 Personal history of colon polyps, unspecified: Secondary | ICD-10-CM | POA: Insufficient documentation

## 2012-10-21 DIAGNOSIS — F329 Major depressive disorder, single episode, unspecified: Secondary | ICD-10-CM | POA: Insufficient documentation

## 2012-10-21 DIAGNOSIS — G8929 Other chronic pain: Secondary | ICD-10-CM | POA: Insufficient documentation

## 2012-10-21 DIAGNOSIS — E119 Type 2 diabetes mellitus without complications: Secondary | ICD-10-CM | POA: Insufficient documentation

## 2012-10-21 DIAGNOSIS — R112 Nausea with vomiting, unspecified: Secondary | ICD-10-CM | POA: Insufficient documentation

## 2012-10-21 DIAGNOSIS — Z9889 Other specified postprocedural states: Secondary | ICD-10-CM | POA: Insufficient documentation

## 2012-10-21 DIAGNOSIS — K859 Acute pancreatitis without necrosis or infection, unspecified: Secondary | ICD-10-CM | POA: Insufficient documentation

## 2012-10-21 DIAGNOSIS — F17200 Nicotine dependence, unspecified, uncomplicated: Secondary | ICD-10-CM | POA: Insufficient documentation

## 2012-10-21 DIAGNOSIS — Z8619 Personal history of other infectious and parasitic diseases: Secondary | ICD-10-CM | POA: Insufficient documentation

## 2012-10-21 DIAGNOSIS — Z79899 Other long term (current) drug therapy: Secondary | ICD-10-CM | POA: Insufficient documentation

## 2012-10-21 DIAGNOSIS — G4700 Insomnia, unspecified: Secondary | ICD-10-CM | POA: Insufficient documentation

## 2012-10-21 DIAGNOSIS — G43909 Migraine, unspecified, not intractable, without status migrainosus: Secondary | ICD-10-CM | POA: Insufficient documentation

## 2012-10-21 LAB — CBC WITH DIFFERENTIAL/PLATELET
Basophils Absolute: 0 10*3/uL (ref 0.0–0.1)
Eosinophils Relative: 3 % (ref 0–5)
Lymphocytes Relative: 21 % (ref 12–46)
Neutro Abs: 7 10*3/uL (ref 1.7–7.7)
Neutrophils Relative %: 68 % (ref 43–77)
Platelets: 193 10*3/uL (ref 150–400)
RBC: 5.37 MIL/uL (ref 4.22–5.81)
RDW: 13.5 % (ref 11.5–15.5)
WBC: 10.4 10*3/uL (ref 4.0–10.5)

## 2012-10-21 LAB — COMPREHENSIVE METABOLIC PANEL
ALT: 32 U/L (ref 0–53)
AST: 21 U/L (ref 0–37)
Alkaline Phosphatase: 142 U/L — ABNORMAL HIGH (ref 39–117)
CO2: 25 mEq/L (ref 19–32)
Calcium: 9.5 mg/dL (ref 8.4–10.5)
Chloride: 100 mEq/L (ref 96–112)
GFR calc non Af Amer: >90 mL/min (ref >90)
Potassium: 3.6 mEq/L (ref 3.5–5.1)
Sodium: 137 mEq/L (ref 135–145)

## 2012-10-21 MED ORDER — MORPHINE SULFATE 4 MG/ML IJ SOLN
4.0000 mg | Freq: Once | INTRAMUSCULAR | Status: AC
  Administered 2012-10-21: 4 mg via INTRAVENOUS
  Filled 2012-10-21: qty 1

## 2012-10-21 MED ORDER — SODIUM CHLORIDE 0.9 % IV SOLN
Freq: Once | INTRAVENOUS | Status: AC
  Administered 2012-10-21: 10:00:00 via INTRAVENOUS

## 2012-10-21 MED ORDER — PROMETHAZINE HCL 25 MG PO TABS: 25.0000 mg | ORAL_TABLET | Freq: Four times a day (QID) | ORAL | Status: DC | PRN

## 2012-10-21 MED ORDER — ONDANSETRON HCL 4 MG/2ML IJ SOLN
4.0000 mg | Freq: Once | INTRAMUSCULAR | Status: AC
  Administered 2012-10-21: 4 mg via INTRAVENOUS
  Filled 2012-10-21: qty 2

## 2012-10-21 MED ORDER — NAPROXEN 500 MG PO TABS: 500.0000 mg | ORAL_TABLET | Freq: Two times a day (BID) | ORAL | Status: DC

## 2012-10-21 NOTE — ED Notes (Signed)
Pt c/o LUQ abdominal pain.  Pain score 7/10.  Hx of chronic pancreatitis and "a mass on my pancreas."  Sts "I'm supposed to follow up with my PCP in November and they are going to schedule a MRI."  Denies n/v/d.

## 2012-10-21 NOTE — ED Provider Notes (Signed)
CSN: [id number]     Arrival date & time 10/21/12  0820 History   First MD Initiated Contact with Patient 10/21/12 [phone]     Chief Complaint  Patient presents with  . Abdominal Pain   (Consider location/radiation/quality/duration/timing/severity/associated sxs/prior Treatment) The history is limited by the condition of the patient.    Patient with chronic abdominal pain and chronic pancreatitis reports flare up of his chronic pancreatitis pain.  Pain is located in the left upper quadrant and is described as someone kicking him in the side. The pain radiates to the back 6/10 in intensity. He has associated nausea and vomiting. Emesis of the contents of the stomach it is nonbloody. He is also had diarrhea with this episode. He is scheduled to start in the pain clinic on October 21. He has a primary care provider not provide pain medications for him for this problem. He is seen by Dr.Gessner, GI, who is following him for pancreatic mass (pt patient).  Denies fever, chills, CP, SOB, cough, urinary symptoms. Denies any ETOH use.   Past Medical History  Diagnosis Date  . Chronic pancreatitis   . Smoker   . Gastritis due to alcohol without hemorrhage   . HTN (hypertension)   . Pancreatic lesion     appears innocent and related to chronic pancreatitis  . Heavy alcohol use     hx of  . Marijuana use     hx of  . Migraine   . Depression   . Helicobacter pylori gastritis 03/27/2011    on endoscopy, treated with omeprazole, clarithromycin and amoxicillin  . Insomnia   . ACE inhibitor-aggravated angioedema   . Sessile colonic polyp   . Diabetes mellitus   . GERD (gastroesophageal reflux disease)   . Chronic abdominal pain on narcotics 06/27/2012   Past Surgical History  Procedure Laterality Date  . Esophagogastroduodenoscopy  03/22/2011    Procedure: ESOPHAGOGASTRODUODENOSCOPY (EGD);  Surgeon: [name], MD;  Location: [facility];  Service: Endoscopy;  Laterality: N/A;  egd with balloon    . Balloon dilation  03/22/2011    Procedure: BALLOON DILATION;  Surgeon: [name], MD;  Location: [facility];  Service: Endoscopy;  Laterality: N/A;  . Eus  04/20/2011    Procedure: UPPER ENDOSCOPIC ULTRASOUND (EUS) LINEAR;  Surgeon: Rachael Fee, MD;  Location: [facility];  Service: Endoscopy;  Laterality: N/A;  . Colonoscopy w/ biopsies and polypectomy  09/12/11  . Colonoscopy N/A 05/22/2012    Procedure: COLONOSCOPY;  Surgeon: [name], MD;  Location: [facility] END[facility]COPY;  Service: Endoscopy;  Laterality: N/A;   Family History  Problem Relation Age of Onset  . Colon cancer Neg Hx   . Stomach cancer Neg Hx   . Anesthesia problems Neg Hx   . Hypotension Neg Hx   . Pseudochol deficiency Neg Hx   . Malignant hyperthermia Neg Hx   . Hypertension Sister   . Diabetes Sister   . Heart disease Sister    History  Substance Use Topics  . Smoking status: Current Every Day Smoker -- 0.20 packs/day for 30 years    Types: Cigarettes  . Smokeless tobacco: Never Used     Comment: 4 -5 cigs/day.Wants to quit.  . Alcohol Use: No     Comment: sobriety since 2/13,was drinking 7-10 beer/daily    Review of Systems  Constitutional: Negative for fever and chills.  Respiratory: Negative for cough and shortness of breath.   Cardiovascular: Negative for chest pain.  Gastrointestinal: Positive  for nausea, vomiting, abdominal pain and diarrhea.  Genitourinary: Negative for dysuria, urgency and frequency.    Allergies  Ace inhibitors and Losartan  Home Medications   Current Outpatient Rx  Name  Route  Sig  Dispense  Refill  . ACCU-CHEK FASTCLIX LANCETS MISC   Does not apply   1 each by Does not apply route 3 (three) times daily before meals. Dx code 250.00 insulin requiring   102 each   12   . ARIPiprazole (ABILIFY) 15 MG tablet   Oral   Take 15 mg by mouth at bedtime. Takes with 5 mg and 2 mg for a 22 mg dose         . ARIPiprazole (ABILIFY) 2 MG tablet   Oral   Take 2 mg by  mouth at bedtime. Takes with 15 mg and 5 mg for a 22 mg dose         . ARIPiprazole (ABILIFY) 5 MG tablet   Oral   Take 5 mg by mouth at bedtime. Takes with 15 mg and 2 mg for a 22 mg dose         . aspirin EC 81 MG tablet   Oral   Take 81 mg by mouth at bedtime.         . Blood Glucose Monitoring Suppl (ACCU-CHEK NANO SMARTVIEW) W/DEVICE KIT   Does not apply   1 each by Does not apply route 3 (three) times daily. Dx code 250.00 insulin requiring   1 kit   0   . FLUoxetine (PROZAC) 20 MG capsule   Oral   Take 20 mg by mouth 2 (two) times daily.          Marland Kitchen glipiZIDE (GLUCOTROL) 10 MG tablet   Oral   Take 10 mg by mouth 2 (two) times daily before a meal.         . glucose blood (ACCU-CHEK SMARTVIEW) test strip      Check blood sugar 3 times a day before meals and bedtime Dx code 250.00 insulin requiring   100 each   12   . hydrochlorothiazide (HYDRODIURIL) 25 MG tablet   Oral   Take 25 mg by mouth daily.         . hydrOXYzine (ATARAX/VISTARIL) 10 MG tablet   Oral   Take 10 mg by mouth daily as needed for itching.          . insulin aspart (NOVOLOG) 100 UNIT/ML injection   Subcutaneous   Inject 10 Units into the skin 2 (two) times daily with a meal.         . insulin glargine (LANTUS) 100 UNIT/ML injection   Subcutaneous   Inject 0.5 mLs (50 Units total) into the skin at bedtime.   10 mL   12   . lovastatin (MEVACOR) 40 MG tablet   Oral   Take 1 tablet (40 mg total) by mouth at bedtime.   30 tablet   2   . metFORMIN (GLUCOPHAGE) 500 MG tablet   Oral   Take 1,000 mg by mouth 2 (two) times daily with a meal.         . omeprazole (PRILOSEC) 20 MG capsule   Oral   Take 20 mg by mouth daily.         . Pancrelipase, Lip-Prot-Amyl, (CREON) 36000 UNITS CPEP   Oral   Take 36,000 Units by mouth 3 (three) times daily.         . pantoprazole (PROTONIX)  40 MG tablet   Oral   Take 1 tablet (40 mg total) by mouth daily.   90 tablet   0     BP 145/101  Pulse 77  Temp(Src) 97.9 F (36.6 C) (Oral)  Resp 16  Ht 5\' 3"  (1.6 m)  Wt 213 lb (96.616 kg)  BMI 37.74 kg/m2  SpO2 100% Physical Exam  Nursing note and vitals reviewed. Constitutional: He appears well-developed and well-nourished. No distress.  HENT:  Head: Normocephalic and atraumatic.  Neck: Neck supple.  Cardiovascular: Normal rate and regular rhythm.   Pulmonary/Chest: Effort normal and breath sounds normal. No respiratory distress. He has no wheezes. He has no rales.  Abdominal: Soft. Bowel sounds are normal. He exhibits no distension and no mass. There is tenderness in the left upper quadrant. There is no rebound and no guarding.  Neurological: He is alert. He exhibits normal muscle tone.  Skin: He is not diaphoretic.    ED Course  Procedures (including critical care time) Labs Review Labs Reviewed  CBC WITH DIFFERENTIAL - Abnormal; Notable for the following:    Hemoglobin 17.4 (*)    All other components within normal limits  COMPREHENSIVE METABOLIC PANEL - Abnormal; Notable for the following:    Glucose, Bld 133 (*)    Alkaline Phosphatase 142 (*)    All other components within normal limits  LIPASE, BLOOD   Imaging Review No results found.  MDM   1. Chronic abdominal pain    Pt with chronic abdominal pain and chronic pancreatitis, reporting increase in pain over past 3 days.  Labs are unremarkable, lipase is normal.  Abdominal exam is benign.  Pt is well appearing and comfortable appearing.  His pain is controlled in ED with 4mg  morphine x 1.  I discussed results with him and he agrees that this is chronic abdominal pain that he has had for many years.  States he needs his pain medicine until he can get to the pain clinic later next month.  I have advised him that the ED is not the proper place for chronic pain management and given his results today and that I doubt acute pancreatitis I will not give him a new prescription for narcotics.  He states  he does not take over the counter medications and I have recommended that he try OTC pain medications unless there is a medical reason why he should not.  I have prescribed naprosyn and phenergan, short course of each, for him to try.  He has a PCP he can follow up with and who should be the one to discuss pain management with until he can get into the pain clinic.  He is also to follow up with his GI doctor.  Discussed result, findings, treatment, and follow up  with patient.  Pt given return precautions.  Pt verbalizes understanding and agrees with plan.        Trixie Dredge, PA-C 10/21/12 1139

## 2012-10-21 NOTE — ED Provider Notes (Signed)
Medical screening examination/treatment/procedure(s) were performed by non-physician practitioner and as supervising physician I was immediately available for consultation/collaboration.   Gilda Crease, MD 10/21/12 [phone]

## 2012-10-22 ENCOUNTER — Other Ambulatory Visit: Payer: Self-pay | Admitting: Dietician

## 2012-10-22 DIAGNOSIS — IMO0002 Reserved for concepts with insufficient information to code with codable children: Secondary | ICD-10-CM

## 2012-10-22 DIAGNOSIS — E1165 Type 2 diabetes mellitus with hyperglycemia: Secondary | ICD-10-CM

## 2012-10-22 NOTE — Telephone Encounter (Signed)
Patient comes for a refill of his Novolog Flexpen. He asks for a sample because he doesn't have money right now for all his medicines. Note current prescription appears to be for a vial and not a pen and must be ordere din increments of 15 mL. Please send corrected Novolog Flexpen prescription to [facility]. Attending physician signed out a sample which was giving to patient today. Patient also needs prescription for pen needles.

## 2012-10-24 MED ORDER — INSULIN PEN NEEDLE 31G X 5 MM MISC: Status: DC

## 2012-11-07 ENCOUNTER — Ambulatory Visit: Payer: Medicaid Other | Admitting: Dietician

## 2012-11-11 ENCOUNTER — Encounter: Payer: Self-pay | Admitting: Dietician

## 2012-11-11 ENCOUNTER — Ambulatory Visit (INDEPENDENT_AMBULATORY_CARE_PROVIDER_SITE_OTHER): Payer: Medicaid Other | Admitting: Dietician

## 2012-11-11 VITALS — Wt 215.3 lb

## 2012-11-11 DIAGNOSIS — IMO0001 Reserved for inherently not codable concepts without codable children: Secondary | ICD-10-CM

## 2012-11-11 DIAGNOSIS — E1165 Type 2 diabetes mellitus with hyperglycemia: Secondary | ICD-10-CM

## 2012-11-11 DIAGNOSIS — IMO0002 Reserved for concepts with insufficient information to code with codable children: Secondary | ICD-10-CM

## 2012-11-11 NOTE — Patient Instructions (Signed)
Please make a follow up appointment for Thursday or Friday of this week.  Take Novolog with your 1st meal of the day and your evening meal.  DO NOT TAKE IF YOUR SKIP A MEAL.  PLEASE GET YOUR STRIPS FOR YOUR NEW METER>  CHECK BLOOD SUGAR 4 TIMES A DAY FOR THE NEXT FEW DAYS - BETWEEN  12 AM AND 4:00 AM is important to be sure you are not having low blood sugars.

## 2012-11-11 NOTE — Progress Notes (Signed)
Diabetes Self-Management Education  Visit Number: Follow-up 4  11/11/2012 [name], identified by name and date of birth, is a 57 y.o. male with Type 2 diabetes       ASSESSMENT  Patient Concerns:    High blood sugars/ meal planning CBG today in office 317 mg/dl after a single hamburger and water  Weight 215 lb 4.8 oz (97.659 kg). Body mass index is 38.15 kg/(m^2).  Lab Results: LDL Cholesterol  Date Value Range Status  06/14/2012 Direct LDL 89  Final                 Hemoglobin A1C  Date Value Range Status  09/03/2012 12.2   Final  11/13/2010 6.7* <5.7 % Final     (NOTE)                                                                                   Family History  Problem Relation Age of Onset  . Colon cancer Neg Hx   . Stomach cancer Neg Hx   . Anesthesia problems Neg Hx   . Hypotension Neg Hx   . Pseudochol deficiency Neg Hx   . Malignant hyperthermia Neg Hx   . Hypertension Sister   . Diabetes Sister   . Heart disease Sister    History  Substance Use Topics  . Smoking status: Current Every Day Smoker -- 0.20 packs/day for 30 years    Types: Cigarettes  . Smokeless tobacco: Never Used     Comment: 4 -5 cigs/day.Wants to quit.  . Alcohol Use: No     Comment: sobriety since 2/13,was drinking 7-10 beer/daily    Support Systems:   stays with sister and says she makes sure he takes his insulin  Special Needs:     Prior DM Education:  yes - here Daily Foot Exams:   need to assess at future visit Patient Belief / Attitude about Diabetes:   it can be controlled  Assessment comments: CBGS since last visit range 94-HI, with most in the 200s and 300s during September( his only 2 belwo 200 were lunchtime and he also reports several episodes of shakinss midday and a blood sugar on the meter he lost of 71 between 1 and 2 PM. Patient lost meter the beginning of October and ran out of strips, did not have money for new meter and strips, so stopped testing first  10 days of October. Then got new meter with 10 strips and blood sugars much higher with 3 fasting blood sugars > 500. Patient is complaining of more migraines, but none today and hot flashes at night unable to sleep. (He says this is not new.) He denies missing any insulin until this am when he skipped breakfast. Marked both insulin pens today to monitor amount gone and watched patient accurately turn pens to current dose amounts and correctly describe how he gets  Pen ready and self administers doses. Unclear if symptoms he is describing are form high low or both. Discussed with attending physician who agreed with plan     Diet Recall:  Breakfast: grits, toast with jelly and grape juice Lunch:  Chicken or baked fish and green  beans or other steamed vegetables or a single hamburg with lettuce and tomato and water Dinner: salmon patty x2, bread x2, peppers, onioins    Individualized Plan for Diabetes Self-Management Training: Patient individualized diabetes plan includes:  Nutrition  Medications  monitoring  how to handle highs and lows  Education Topics Reviewed with Patient Today:  Topic Points Discussed  Disease State    Nutrition Management Meal timing in regards to the patients' current diabetes medication.  Physical Activity and Exercise    Medications    Monitoring    Acute Complications    Chronic Complications Relationship between chronic complications and blood glucose control  Psychosocial Adjustment    Goal Setting    Preconception Care (if applicable)      PATIENTS GOALS   Learning Objective(s):     Goal The patient agrees to:  Nutrition    Physical Activity    Medications    Monitoring    Problem Solving    Reducing Risk    Health Coping     Patient Self-Evaluation of Goals (Subsequent Visits)  Goal The patient rates self as meeting goals (% of time)  Nutrition 50 - 75 %  Physical Activity    Medications    Monitoring    Problem Solving    Reducing  Risk 25 - 50%  Health Coping       PERSONALIZED PLAN / SUPPORT  Self-Management Support:  Doctor's office;Family;CDE visits Starting classes at youth program 3 days a week. ______________________________________________________________________  Outcomes  Expected Outcomes:  Demonstrated interest in learning. Expect positive outcomes Self-Care Barriers:  Lack of material resources Education material provided: yes If problems or questions, patient to contact team via:  Phone Time in: 1330     Time out: 1430 Future DSME appointment: - Other (comment)  Plyler, Lupita Leash 11/11/2012 3:32 PM

## 2012-11-15 ENCOUNTER — Ambulatory Visit (INDEPENDENT_AMBULATORY_CARE_PROVIDER_SITE_OTHER): Payer: Medicaid Other | Admitting: Dietician

## 2012-11-15 VITALS — Wt 220.0 lb

## 2012-11-15 DIAGNOSIS — E1165 Type 2 diabetes mellitus with hyperglycemia: Secondary | ICD-10-CM

## 2012-11-15 DIAGNOSIS — IMO0001 Reserved for inherently not codable concepts without codable children: Secondary | ICD-10-CM

## 2012-11-15 DIAGNOSIS — IMO0002 Reserved for concepts with insufficient information to code with codable children: Secondary | ICD-10-CM

## 2012-11-15 NOTE — Progress Notes (Signed)
Diabetes Self-Management Education  Visit Number: Other (Comment)  11/15/2012 [name], identified by name and date of birth, is a 57 y.o. male with type 2 diabetes  .    ASSESSMENT  Patient Concerns:     Weight 220 lb (99.791 kg). Body mass index is 38.98 kg/(m^2).  Lab Results: LDL Cholesterol  Date Value Range Status  06/14/2012    Final           Hemoglobin A1C  Date Value Range Status  09/03/2012 12.2   Final  11/13/2010 6.7* <5.7 % Final     (NOTE)                                                                               Family History  Problem Relation Age of Onset  . Colon cancer Neg Hx   . Stomach cancer Neg Hx   . Anesthesia problems Neg Hx   . Hypotension Neg Hx   . Pseudochol deficiency Neg Hx   . Malignant hyperthermia Neg Hx   . Hypertension Sister   . Diabetes Sister   . Heart disease Sister    History  Substance Use Topics  . Smoking status: Current Every Day Smoker -- 0.20 packs/day for 30 years    Types: Cigarettes  . Smokeless tobacco: Never Used     Comment: 4 -5 cigs/day.Wants to quit.  . Alcohol Use: No     Comment: sobriety since 2/13,was drinking 7-10 beer/daily       Assessment comments: Patient seen today. He continues to have headaches. Meter was downloaded. Results Range from a low of 213 to a high of 445 from 10 blood sugars over the past few days, conformed by amount left in pen that  patient is taking his Lantus 50 units every night and takes his NovoLog most mornings but misses his evening NovoLog 3-4 nights a week. Blood sugars generally high overnights and before meals. Suggest increasing insulin by 10-20%. Could do this by adding a fourth injection of 10 units NovoLog before lunch daily Estimated insulin needs are 50- 120 units a day. Patient currently on total daily dose of 70 units a day. Could consider a split mixed insulin twice a day if patient is resistant to adding a fourth injection at lunch.   Diet Recall:  Patient reports reasonable food intake despite a 5 pound weight gain. Breakfast:2 scrambled legs some kind of starch- half a banana muffin. For lunch- 2 salmon patties, water. 5-7 PM A fried fish sandwich from [facility] and a small grape soda 4th meal ~ 7:30 PM, baked fish, ~2/3 cup  rice squash   Individualized Plan for Diabetes Self-Management Training:  Topic Points Discussed  Disease State    Nutrition Management    Physical Activity and Exercise    Medications    Monitoring    Acute Complications Discussed and identified patients' treatment of hyperglycemia.  Chronic Complications    Psychosocial Adjustment    Goal Setting    Preconception Care (if applicable)      PATIENTS GOALS   Learning Objective(s):     Goal The patient agrees to:  Nutrition    Physical Activity  Medications    Monitoring test blood glucose before bed, 3 a.m., and fasting  Problem Solving    Reducing Risk    Health Coping     Patient Self-Evaluation of Goals (Subsequent Visits)  Goal The patient rates self as meeting goals (% of time)  Nutrition 50 - 75 %  Physical Activity    Medications    Monitoring 25 - 50%  Problem Solving    Reducing Risk    Health Coping       PERSONALIZED PLAN / SUPPORT  Self-Management Support:  Doctor's office;Family;CDE visits ______________________________________________________________________  Outcomes  Expected Outcomes:     Self-Care Barriers:  Lack of material resources Education material provided:  Meter download If problems or questions, patient to contact team via:  Phone Time in: 1330     Time out: 1400 Future DSME appointment: - PRN   Alesia Oshields, Lupita Leash 11/15/2012 4:34 PM

## 2012-11-28 ENCOUNTER — Telehealth: Payer: Self-pay | Admitting: Dietician

## 2012-11-28 NOTE — Telephone Encounter (Signed)
Patient presents asking for a sample of Novolog. He has about 2 days left of Novolog. He has had one sample in 8- 2014. Social work is aware of situation. Told patient I would notify his physician and let him know. Our office has typically limited the number of times patients can access insulin samples.

## 2012-12-06 ENCOUNTER — Other Ambulatory Visit: Payer: Self-pay | Admitting: *Deleted

## 2012-12-06 MED ORDER — GLUCOSE BLOOD VI STRP: ORAL_STRIP | Status: DC

## 2012-12-06 NOTE — Telephone Encounter (Signed)
Rx given to pt. 

## 2012-12-13 ENCOUNTER — Ambulatory Visit (INDEPENDENT_AMBULATORY_CARE_PROVIDER_SITE_OTHER): Payer: Medicaid Other | Admitting: Internal Medicine

## 2012-12-13 VITALS — BP 128/77 | HR 92 | Temp 97.1°F | Wt 213.0 lb

## 2012-12-13 DIAGNOSIS — IMO0001 Reserved for inherently not codable concepts without codable children: Secondary | ICD-10-CM

## 2012-12-13 DIAGNOSIS — E1165 Type 2 diabetes mellitus with hyperglycemia: Secondary | ICD-10-CM

## 2012-12-13 DIAGNOSIS — K861 Other chronic pancreatitis: Secondary | ICD-10-CM

## 2012-12-13 DIAGNOSIS — IMO0002 Reserved for concepts with insufficient information to code with codable children: Secondary | ICD-10-CM

## 2012-12-13 LAB — GLUCOSE, CAPILLARY: Glucose-Capillary: 389 mg/dL — ABNORMAL HIGH (ref 70–99)

## 2012-12-13 MED ORDER — INSULIN GLARGINE 100 UNIT/ML ~~LOC~~ SOLN: 60.0000 [IU] | Freq: Every day | SUBCUTANEOUS | Status: DC

## 2012-12-13 NOTE — Progress Notes (Signed)
  Subjective:    Patient ID: Michael [name], male    DOB: 20-Aug-1955, 57 y.o.   MRN: [id number]  HPI [name] is a 57 year old African American man with a history of very poorly controlled diabetes type 2, hypertension, tonic pancreatitis who comes in for diabetes management. Has been very difficult to transition the patient to be able to manage subcutaneous insulin and a slow titration of Lantus along with sliding scale NovoLog has been continued over the past 3-4 months. Patient CBG readings seem to be preprandial in the 300s and consistently in the 4 to 500s. Patient states that he has been feeling hot, with headaches and some sweating whenever his sugars are in the 200s and therefore he will stop the combination of NovoLog and Lantus. He does report on one occasion a low of 70 cm before lunch but there is no record of that when his meter was downloaded today in clinic. Patient states that he is taking his Lantus appropriately in his upper arm and that seems to be doing well.  He continues to smoke and has increased dietary indiscretions especially with soft drinks.  He is participating in a substance abuse program at this time which is very therapeutic for him in terms of his mood stability. He is also been following up with pain clinic since he has been dismissed from [facility] and is unable to receive narcotic medications for his chronic pancreatitis at the [facility] clinic do to indiscretions. He is asking for a GI referral for repeat colonoscopy given his suspicious polyps.   Review of Systems     Objective:   Physical Exam  Filed Vitals:   12/13/12 1337  BP: 128/77  Pulse: 92  Temp: 97.1 F (36.2 C)  General: sitting in chair  HEENT: PERRL, EOMI, no scleral icterus Cardiac: RRR, no rubs, murmurs or gallops Pulm: clear to auscultation bilaterally, moving normal volumes of air Abd: soft, nontender, nondistended, BS present Ext: warm and well perfused, no pedal edema Neuro: alert and  oriented X3, cranial nerves II-XII grossly intact      Assessment & Plan:  1. DM: his hemoglobin A1c is 13.7 today and continues to increase despite even adding insulin at this time. Patient seems confused and self managing "combo insulin." Because patient seems to feel symptomatic when blood sugars are more normalized in the 1 to 200s he stops taking NovoLog. There significant inconsistency with this patient's ability to manage even the simplest insulin regimen. The patient believes that he is doing a good job self managing and is refusing increased frequency of clinic visits or visits with the diabetes educator to be able to help him manage his CBGs. Given the significant course of the previous better control with hemoglobin A1c's in the 8 to now continue to increase despite even adding insulin it is concerning. Placement of patient in a SNF may need to be considered in the future. -increased Lantus to 60U QHS -cont novolog 10U BID -appt with DM educator -recheck in 1 month -pt was counseled on smoking cessation and dietary importance  2. Chronic pancreatitis: pt was previously dismissed from [facility] in [place] and requesting referral for repeat colonoscopy given adenomatous polyps and poor prep during last colonoscopy.  -referral made  Pt discussed with Dr. Josem Kaufmann

## 2012-12-13 NOTE — Patient Instructions (Signed)
We are making some changes to your insulin:   Your sugars are really high.   Lantus 60U at night    keep with the 10U of Novolog at night in the Morning  We will recheck your blood sugars in the month.

## 2012-12-15 NOTE — Progress Notes (Signed)
Case discussed with Dr. Sadek soon after the resident saw the patient.  We reviewed the resident's history and exam and pertinent patient test results.  I agree with the assessment, diagnosis and plan of care documented in the resident's note. 

## 2013-01-01 ENCOUNTER — Telehealth: Payer: Self-pay | Admitting: *Deleted

## 2013-01-01 NOTE — Telephone Encounter (Signed)
RTC to pt.  ML that the Clinics have returned his call.  Angelina Ok, RN 01/01/2013 4:12 PM.

## 2013-01-10 ENCOUNTER — Encounter: Payer: Medicaid Other | Admitting: Internal Medicine

## 2013-02-03 ENCOUNTER — Encounter: Payer: Self-pay | Admitting: Licensed Clinical Social Worker

## 2013-02-03 NOTE — Progress Notes (Signed)
[name] presents this morning, requesting CSW to assist with referral paperwork received for [facility].  CSW read questionnaire and completed form based on pt's answers.  Provided copy of medications from EMR.  Pt aware he will need to [place] out of county transportation at least 2 wks in advance.  [name] has an appt with PCP on 1/16 pt encouraged to discuss Pain Clinic recommendation for outpatient PT.  Pt has a f/u appt with Pain Clinic on 1/14.  Pt is working with a dentist office to get dentures which have been approved per pt.  [name] requesting assistance from [facility] pantry due to food stamps not being available until 21st of this month.  Pt provided with several items from pantry.  Pt denies add'l needs at this time.

## 2013-02-04 ENCOUNTER — Telehealth: Payer: Self-pay | Admitting: *Deleted

## 2013-02-04 NOTE — Telephone Encounter (Signed)
[facility] called about changing  Novolog to a quick pen. Talked with [place] [phone]. Hilda Blades [place] Espina RN 02/04/13 6PM

## 2013-02-07 ENCOUNTER — Ambulatory Visit (INDEPENDENT_AMBULATORY_CARE_PROVIDER_SITE_OTHER): Payer: Medicaid Other | Admitting: Internal Medicine

## 2013-02-07 ENCOUNTER — Encounter: Payer: Self-pay | Admitting: Internal Medicine

## 2013-02-07 VITALS — BP 129/87 | HR 110 | Temp 97.4°F | Ht 63.0 in | Wt 207.7 lb

## 2013-02-07 DIAGNOSIS — IMO0002 Reserved for concepts with insufficient information to code with codable children: Secondary | ICD-10-CM

## 2013-02-07 DIAGNOSIS — E1165 Type 2 diabetes mellitus with hyperglycemia: Secondary | ICD-10-CM

## 2013-02-07 DIAGNOSIS — Z760 Encounter for issue of repeat prescription: Secondary | ICD-10-CM

## 2013-02-07 DIAGNOSIS — IMO0001 Reserved for inherently not codable concepts without codable children: Secondary | ICD-10-CM

## 2013-02-07 DIAGNOSIS — I10 Essential (primary) hypertension: Secondary | ICD-10-CM

## 2013-02-07 DIAGNOSIS — E785 Hyperlipidemia, unspecified: Secondary | ICD-10-CM

## 2013-02-07 LAB — GLUCOSE, CAPILLARY: GLUCOSE-CAPILLARY: 259 mg/dL — AB (ref 70–99)

## 2013-02-07 MED ORDER — INSULIN ASPART 100 UNIT/ML FLEXPEN: 15.0000 [IU] | PEN_INJECTOR | Freq: Three times a day (TID) | SUBCUTANEOUS | Status: DC

## 2013-02-07 MED ORDER — LOVASTATIN 40 MG PO TABS: 40.0000 mg | ORAL_TABLET | Freq: Every day | ORAL | Status: DC

## 2013-02-07 MED ORDER — PANTOPRAZOLE SODIUM 40 MG PO TBEC: 40.0000 mg | DELAYED_RELEASE_TABLET | Freq: Every day | ORAL | Status: DC

## 2013-02-07 NOTE — Patient Instructions (Signed)
Increase your novolog to 15 units BID   We will follow up in 1 month to see how your sugars are doing.   Keep up the good work with your diet and walking!

## 2013-02-07 NOTE — Assessment & Plan Note (Signed)
BP Readings from Last 3 Encounters:  02/07/13 129/87  12/13/12 128/77  10/21/12 121/80   Well controlled. Pt has severe angioedema to ACEi and ARB therefore only on HCTZ.  -cont current managment

## 2013-02-07 NOTE — Assessment & Plan Note (Signed)
Lab Results  Component Value Date   CHOL 205* 06/14/2012   HDL 32* 06/14/2012   LDLCALC Comment:   Not calculated due to Triglyceride >400. Suggest ordering Direct LDL (Unit Code: [phone]).   Total Cholesterol/HDL Ratio:CHD Risk                        Coronary Heart Disease Risk Table                                        Men       Women          1/2 Average Risk              3.4        3.3              Average Risk              5.0        4.4           2X Average Risk              9.6        7.1           3X Average Risk             23.4       11.0 Use the calculated Patient Ratio above and the CHD Risk table  to determine the patient's CHD Risk. ATP III Classification (LDL):       < 100        mg/dL         Optimal      100 - 129     mg/dL         Near or Above Optimal      130 - 159     mg/dL         Borderline High      160 - 189     mg/dL         High       > 190        mg/dL         Very High   06/14/2012   LDLDIRECT 89 06/14/2012   TRIG 580* 06/14/2012   CHOLHDL 6.4 06/14/2012   Pt needed refill on medication and will recheck labs at next visit.

## 2013-02-07 NOTE — Progress Notes (Signed)
Case discussed with Dr. Sadek at the time of the visit.  We reviewed the resident's history and exam and pertinent patient test results.  I agree with the assessment, diagnosis, and plan of care documented in the resident's note. 

## 2013-02-07 NOTE — Progress Notes (Signed)
  Subjective:    Patient ID: Michael [name], male    DOB: 07/11/1955, 58 y.o.   MRN: [id number]  HPI  Michael [name] is a 58 year old AA man pmh as listed below presents for DM recheck.   Pt has had a very difficult time transitioning and fully understanding how to administer insulin given complicated psych hx and low literacy. Patient has been continuing with Lantus 60 units each bedtime and NovoLog 15 units twice a day along with metformin glipizide. He does not bring in his meter for review. I had stated that his CBG readings most recently are in the 2 to 300s. He knows and recognizes hypoglycemic symptoms has not had any recently. Since joining his substance abuse program he's been highly motivated to exercise and make dietary changes which have contributed to an intentional weight loss. The patient states that he walks every day at least for 30-40 minutes.  He continues to smoke and is working on cessation. He continues to participate in a substance abuse program at this time which is very therapeutic for him in terms of his mood stability.   Review of Systems  Constitutional: Positive for activity change (has been exercising more). Negative for fever, chills, diaphoresis, appetite change, fatigue and unexpected weight change (intentional weight loss).  Respiratory: Negative for cough, chest tightness and shortness of breath.   Cardiovascular: Negative for chest pain, palpitations and leg swelling.  Gastrointestinal: Positive for abdominal pain (baseline ) and diarrhea (chronic but better with enzyme Tx). Negative for nausea, vomiting, constipation and blood in stool.  Endocrine: Negative for polydipsia and polyuria.  Neurological: Negative for dizziness, tremors, light-headedness and headaches.  Psychiatric/Behavioral: Negative for suicidal ideas, self-injury and agitation. The patient is not nervous/anxious.    Past Medical History  Diagnosis Date  . Chronic pancreatitis   . Smoker   .  Gastritis due to alcohol without hemorrhage   . HTN (hypertension)   . Pancreatic lesion     appears innocent and related to chronic pancreatitis  . Heavy alcohol use     hx of  . Marijuana use     hx of  . Migraine   . Depression   . Helicobacter pylori gastritis 03/27/2011    on endoscopy, treated with omeprazole, clarithromycin and amoxicillin  . Insomnia   . ACE inhibitor-aggravated angioedema   . Sessile colonic polyp   . Diabetes mellitus   . GERD (gastroesophageal reflux disease)   . Chronic abdominal pain on narcotics 06/27/2012   Social, surgical, family history reviewed with patient and updated in appropriate chart locations.      Objective:   Physical Exam  Filed Vitals:   02/07/13 1339  BP: 129/87  Pulse: 110  Temp: 97.4 F (36.3 C)   General: sitting in chair, NAD HEENT: PERRL, EOMI, no scleral icterus Cardiac: RRR, no rubs, murmurs or gallops Pulm: clear to auscultation bilaterally, no wheezes, crackles or rhonchi, moving normal volumes of air Abd: soft, nontender, nondistended, BS present Ext: warm and well perfused, no pedal edema Neuro: alert and oriented X3, cranial nerves II-XII grossly intact     Assessment & Plan:  Please see problem oriented charting  Pt discussed with Dr. Lynnae [name]

## 2013-02-07 NOTE — Assessment & Plan Note (Addendum)
Lab Results  Component Value Date   HGBA1C 13.7 12/13/2012   Wt Readings from Last 3 Encounters:  02/07/13 207 lb 11.2 oz (94.212 kg)  12/13/12 213 lb (96.616 kg)  11/15/12 220 lb (99.791 kg)   Pt has been very hard to manage but has been motivated given new substance abuse program and continued with some exercise and dietary changes that caused some 11 lb intentional weight loss. Pt didn't bring in meter but states CBGs in the 2-300s.  -cont lantus 60U qhs -increase novolog 15 units BID -cont diet and exercise and meeting with DM educator -f/u in 1 month for titration and meter readings

## 2013-02-11 NOTE — Telephone Encounter (Signed)
Dr Algis Liming saw pt in clinic 02/07/13.

## 2013-02-20 ENCOUNTER — Other Ambulatory Visit: Payer: Self-pay | Admitting: Internal Medicine

## 2013-02-20 DIAGNOSIS — M5410 Radiculopathy, site unspecified: Secondary | ICD-10-CM

## 2013-02-21 ENCOUNTER — Other Ambulatory Visit: Payer: Self-pay | Admitting: *Deleted

## 2013-02-21 ENCOUNTER — Telehealth: Payer: Self-pay | Admitting: *Deleted

## 2013-02-21 MED ORDER — PROMETHAZINE HCL 25 MG PO TABS: 25.0000 mg | ORAL_TABLET | Freq: Four times a day (QID) | ORAL | Status: DC | PRN

## 2013-02-21 NOTE — Telephone Encounter (Signed)
Pt here to see Edwena Blow, states he still having nausea. Thanks

## 2013-02-21 NOTE — Telephone Encounter (Signed)
Promethazine rx called to [facility].

## 2013-02-27 NOTE — Addendum Note (Signed)
Addended by: Hulan Fray on: 02/27/2013 07:30 PM   Modules accepted: Orders

## 2013-03-03 ENCOUNTER — Telehealth: Payer: Self-pay | Admitting: Licensed Clinical Social Worker

## 2013-03-03 NOTE — Telephone Encounter (Signed)
CSW received call from Mr. [place]. Mr. Thumm states he received prescription from Dentist for pain and informed [facility], showed clinic bottle during visit.  Pt in need of 3 affidavits regarding his pain medications.  Mr. Elahi states Dr. Edwyna Ready will not complete affidavit and may need referral to Pain Clinic in [facility].  If pt should need a new referral for a pain clinic, pt provided CSW with information to [facility] in [place], [phone].  CSW informed Mr. [place]k, [place] will notify PCP and nursing.

## 2013-03-03 NOTE — Telephone Encounter (Signed)
CSW placed call to [name].  Requesting [name] to sign ROI at [facility] appt on 2/11, for Dr. Domingo Madeira office to fax documentation of appt and need for Tylenol 3 to [facility].  CSW also requesting [name] to sign ROI in order for [facility] to speak with [facility] and Dentist/Dr. Nyoka Cowden.  Pt in agreement and will sign ROI on 2/11.  [name] aware of [facility] fax number.  In addition, [name] continues to work on obtaining housing.  CSW encouraged pt to go to [employer].  Pt complains about the fact he has moved with sister to [facility] [place], which is too far according to [name].

## 2013-03-12 ENCOUNTER — Telehealth: Payer: Self-pay | Admitting: Licensed Clinical Social Worker

## 2013-03-12 ENCOUNTER — Other Ambulatory Visit: Payer: Self-pay | Admitting: *Deleted

## 2013-03-12 NOTE — Telephone Encounter (Signed)
Michael [name] called to [facility] today requesting current med list to be faxed to [facility]  Pt provided CSW with fax number.  Pt called back to state they are in office now.  CSW informed Michael [name] will fax med list as soon as CSW has time available.  List faxed to number pt's provided.

## 2013-03-13 MED ORDER — HYDROCHLOROTHIAZIDE 25 MG PO TABS: 25.0000 mg | ORAL_TABLET | Freq: Every day | ORAL | Status: DC

## 2013-03-18 NOTE — Telephone Encounter (Signed)
Erroneous encounter

## 2013-03-21 ENCOUNTER — Encounter: Payer: Medicaid Other | Admitting: Internal Medicine

## 2013-03-21 ENCOUNTER — Encounter: Payer: Self-pay | Admitting: Internal Medicine

## 2013-03-28 ENCOUNTER — Ambulatory Visit (INDEPENDENT_AMBULATORY_CARE_PROVIDER_SITE_OTHER): Payer: Medicaid Other | Admitting: Internal Medicine

## 2013-03-28 VITALS — BP 144/94 | HR 97 | Temp 98.3°F | Wt 206.2 lb

## 2013-03-28 DIAGNOSIS — Z733 Stress, not elsewhere classified: Secondary | ICD-10-CM

## 2013-03-28 DIAGNOSIS — IMO0002 Reserved for concepts with insufficient information to code with codable children: Secondary | ICD-10-CM

## 2013-03-28 DIAGNOSIS — E1165 Type 2 diabetes mellitus with hyperglycemia: Secondary | ICD-10-CM

## 2013-03-28 DIAGNOSIS — IMO0001 Reserved for inherently not codable concepts without codable children: Secondary | ICD-10-CM

## 2013-03-28 DIAGNOSIS — Z658 Other specified problems related to psychosocial circumstances: Secondary | ICD-10-CM

## 2013-03-28 LAB — GLUCOSE, CAPILLARY: GLUCOSE-CAPILLARY: 193 mg/dL — AB (ref 70–99)

## 2013-03-28 NOTE — Progress Notes (Signed)
  Subjective:    Patient ID: Michael [name], male    DOB: [birth year], 58 y.o.   MRN: [id number]  HPI  Michael [name] is a 58 year old AA man pmh as listed below presents for DM recheck.   Patient has been continuing with Lantus 60 units each bedtime and NovoLog 15 units twice a day along with metformin and glipizide. He does not bring in his meter for review. He states he likes this current treatment and is not ready to make any changes as he "feels it is working." He knows and recognizes hypoglycemic symptoms has not had any recently. He continues to state that he is highly motivated to exercise and make dietary changes which have contributed to an intentional weight loss. The patient states that he walks every day at least for 30-40 minutes. But the pt also has a very robust diet and continues to compulsively eat and request food from the clinic per RN reports.   He continues to smoke and is working on cessation. He continues to participate in a substance abuse program at this time which is very therapeutic for him in terms of his mood stability.   Review of Systems  Constitutional: Positive for activity change (has been exercising more). Negative for fever, chills, diaphoresis, appetite change, fatigue and unexpected weight change (intentional weight loss).  Respiratory: Negative for cough, chest tightness and shortness of breath.   Cardiovascular: Negative for chest pain, palpitations and leg swelling.  Gastrointestinal: Positive for abdominal pain (baseline ) and diarrhea (chronic but better with enzyme Tx). Negative for nausea, vomiting, constipation and blood in stool.  Endocrine: Negative for polydipsia and polyuria.  Neurological: Negative for dizziness, tremors, light-headedness and headaches.  Psychiatric/Behavioral: Negative for suicidal ideas, self-injury and agitation. The patient is not nervous/anxious.    Past Medical History  Diagnosis Date  . Chronic pancreatitis   . Smoker   .  Gastritis due to alcohol without hemorrhage   . HTN (hypertension)   . Pancreatic lesion     appears innocent and related to chronic pancreatitis  . Heavy alcohol use     hx of  . Marijuana use     hx of  . Migraine   . Depression   . Helicobacter pylori gastritis 03/27/2011    on endoscopy, treated with omeprazole, clarithromycin and amoxicillin  . Insomnia   . ACE inhibitor-aggravated angioedema   . Sessile colonic polyp   . Diabetes mellitus   . GERD (gastroesophageal reflux disease)   . Chronic abdominal pain on narcotics 06/27/2012   Social, surgical, family history reviewed with patient and updated in appropriate chart locations.      Objective:   Physical Exam  Filed Vitals:   03/28/13 1334  BP: 144/94  Pulse: 97  Temp: 98.3 F (36.8 C)   General: sitting in chair, NAD Cardiac: RRR, no rubs, murmurs or gallops Pulm: clear to auscultation bilaterally, no wheezes, crackles or rhonchi, moving normal volumes of air Abd: soft, nontender, nondistended, BS present Ext: warm and well perfused, no pedal edema Neuro: cranial nerves II-XII grossly intact     Assessment & Plan:  Please see problem oriented charting  Pt discussed with Dr. Stann Mainland

## 2013-03-28 NOTE — Patient Instructions (Signed)
Keep up the good work on your diet and your exercise.   Good luck on you finding a new house!   Please bring your meter for your next visit to make changes.   Please see [place] in 2-4 weeks.

## 2013-03-29 ENCOUNTER — Encounter: Payer: Self-pay | Admitting: Internal Medicine

## 2013-03-29 NOTE — Assessment & Plan Note (Signed)
Wt Readings from Last 3 Encounters:  03/28/13 206 lb 3.2 oz (93.532 kg)  02/07/13 207 lb 11.2 oz (94.212 kg)  12/13/12 213 lb (96.616 kg)   The pt appears to have stable weight with little weight loss as perceived by the patient. He is not interested in changing any of his treatment during this visit and doesn't bring in his meter. -cont current management -f/u in 2-4 wks for CBG meter reading and titration

## 2013-03-29 NOTE — Assessment & Plan Note (Signed)
Pt seems to be improving with substance abuse program. Is in the process of finding a new home and disability applications.

## 2013-04-07 NOTE — Progress Notes (Signed)
Case discussed with Dr. Sadek soon after the resident saw the patient.  We reviewed the resident's history and exam and pertinent patient test results.  I agree with the assessment, diagnosis, and plan of care documented in the resident's note. 

## 2013-04-23 ENCOUNTER — Telehealth: Payer: Self-pay | Admitting: Licensed Clinical Social Worker

## 2013-04-23 NOTE — Telephone Encounter (Signed)
Michael [name] placed call to CSW.  Pt inquiring about a new referral for outpatient PT because previous agency did not accept his medicaid.  CSW informed Michael [name] it was not that the agency did not take medicaid.  His payor source would only cover outpatient PT following a hospitalization or had surgery within 6 months.  Pt voiced understanding.  Michael [name] continued to complain of neck pain and is aware he has an upcoming appt with PCP.  Pt is linked with a Pain management clinic.

## 2013-05-05 ENCOUNTER — Telehealth: Payer: Self-pay | Admitting: Licensed Clinical Social Worker

## 2013-05-05 NOTE — Telephone Encounter (Signed)
CSW has received several telephone calls from [name].  CSW had mailed [name] information on obtaining his birth certificate from [place] per his request, but pt states he never received information.  Pt in need of birth certificate to complete his housing application.  [name] states he has been approved for SS disability and is current with Medicaid insurance.  Pt has medical transportation and is utilizing new agency for transportation, CSW unclear of the specific name of this agency.  [name] takes classes through [employer].  Pt is currently staying with his sister in [place] after a short stint with his male cousin.  [name] requesting CSW to leave information for birth certificate and two bus passes for his appt on 05/09/13.  CSW encouraged pt that all medical appt transportation including behavioral health should be utilized through his Medicaid benefit.  Pt aware but concern there may be issues getting through.  CSW will leave birth certificate application and two bus passes with nursing.  Pt would also like personal hygiene items from pantry.  Pt encouraged to notify nurse but pt requesting CSW to leave items.  CSW will notify nursing to assist pt after [facility] appt.

## 2013-05-07 ENCOUNTER — Telehealth: Payer: Self-pay | Admitting: Licensed Clinical Social Worker

## 2013-05-07 NOTE — Telephone Encounter (Signed)
CSW received call from Michael [name].  Pt states [facility] is requesting Dr. Algis Liming to fax an authorization so he "can get a shot in my neck".  Michael [name] reports based on MRI, [facility] clinic can provide a shot to help his neck pain but requires authorization from PCP.  CSW placed call to [place], [employer].  Office states most likely authorization for epidural and referred CSW to authorization nurse.  CSW left message requesting two way communication between [facility] and [facility] as information gets distorted when relayed by Michael [name].  CSW inquired if ROI is needed even though [facility] was referring agency.  Pt in agreement to sign ROI during appointment.

## 2013-05-08 NOTE — Telephone Encounter (Signed)
CSW received call back from [place], [facility] [phone].  CSW returned call requesting [facility] to fax all authorization request to Dr. Algis Liming attn at  [phone].

## 2013-05-09 ENCOUNTER — Ambulatory Visit (INDEPENDENT_AMBULATORY_CARE_PROVIDER_SITE_OTHER): Payer: Medicaid Other | Admitting: Internal Medicine

## 2013-05-09 ENCOUNTER — Encounter: Payer: Self-pay | Admitting: Internal Medicine

## 2013-05-09 VITALS — BP 121/80 | HR 83 | Temp 97.2°F | Ht 63.0 in | Wt 202.0 lb

## 2013-05-09 DIAGNOSIS — I10 Essential (primary) hypertension: Secondary | ICD-10-CM

## 2013-05-09 DIAGNOSIS — G894 Chronic pain syndrome: Secondary | ICD-10-CM

## 2013-05-09 DIAGNOSIS — K860 Alcohol-induced chronic pancreatitis: Secondary | ICD-10-CM

## 2013-05-09 DIAGNOSIS — F1010 Alcohol abuse, uncomplicated: Secondary | ICD-10-CM

## 2013-05-09 DIAGNOSIS — K861 Other chronic pancreatitis: Secondary | ICD-10-CM

## 2013-05-09 DIAGNOSIS — IMO0001 Reserved for inherently not codable concepts without codable children: Secondary | ICD-10-CM

## 2013-05-09 DIAGNOSIS — IMO0002 Reserved for concepts with insufficient information to code with codable children: Secondary | ICD-10-CM

## 2013-05-09 DIAGNOSIS — E1165 Type 2 diabetes mellitus with hyperglycemia: Secondary | ICD-10-CM

## 2013-05-09 LAB — GLUCOSE, CAPILLARY: Glucose-Capillary: 364 mg/dL — ABNORMAL HIGH (ref 70–99)

## 2013-05-09 LAB — POCT GLYCOSYLATED HEMOGLOBIN (HGB A1C): Hemoglobin A1C: 11.1

## 2013-05-09 MED ORDER — PROMETHAZINE HCL 25 MG PO TABS: 25.0000 mg | ORAL_TABLET | Freq: Four times a day (QID) | ORAL | Status: DC | PRN

## 2013-05-09 NOTE — Assessment & Plan Note (Signed)
BP Readings from Last 3 Encounters:  05/09/13 121/80  03/28/13 144/94  02/07/13 129/87   Pt has maintained excellent control on current management.  -cont HCTZ 25mg 

## 2013-05-09 NOTE — Patient Instructions (Addendum)
Thank you for bringing your medicines today. This helps [place] keep you safe from mistakes.   You are doing better in terms of your diabetes and keep up doing what you are doing.   We will recheck your Vitamin D in 2 wks.

## 2013-05-09 NOTE — Assessment & Plan Note (Addendum)
Wt Readings from Last 3 Encounters:  05/09/13 202 lb (91.627 kg)  03/28/13 206 lb 3.2 oz (93.532 kg)  02/07/13 207 lb 11.2 oz (94.212 kg)   Pt is starting to have some weight loss that is intentional. This was encouraged as will help with his other chronic issues. HgbA1c 11.1 today as compared to 13.7. Which is excellent improvement for the patient. He still didn't bring in his meter today.   The patient is not interested in making any changes to his insulin or oral medications today. Therefore will continue and follow up in 2-4 wks.  -foot exam and HgbA1c completed today.

## 2013-05-09 NOTE — Progress Notes (Signed)
Subjective:    Patient ID: Michael [name], male    DOB: 07-06-1955, 58 y.o.   MRN: [id number]  HPI Mr. Iiams is a 58yo man pmh as outlined below presents for DM recheck.   Pt had been recently concerned about weight loss and possible "exacerbation of his chronic pancreatitis." He was recently seen by [facility] for his ongoing chornic pancreatitis and without new medications or changes to current treatment plans of continuing creon. He is set to follow up with them again after his records have been transferred. He was found to have a slight VitD deficiency and treated with supplementation that patient has been compliant with. He brings in his medications for review.  Patient has been continuing with Lantus 60 units each bedtime and NovoLog 15 units twice a day along with metformin and glipizide. He does not bring in his meter for review. He states he likes this current treatment and is not ready to make any changes as he "feels it is working and would otherwise be too complicated." He knows and recognizes hypoglycemic symptoms has not had any recently. He continues to state that he is highly motivated to exercise and make dietary changes which have contributed to an intentional weight loss. The patient states that he walks every day at least for 30-40 minutes. He works on Arboriculturist dietary changes and intentional weight loss is his goal.  He continues to smoke and is working on cessation. He continues to participate in a substance abuse program at this time which is very therapeutic for him in terms of his mood stability.    Past Medical History  Diagnosis Date  . Chronic pancreatitis   . Smoker   . Gastritis due to alcohol without hemorrhage   . HTN (hypertension)   . Pancreatic lesion     appears innocent and related to chronic pancreatitis  . Heavy alcohol use     hx of  . Marijuana use     hx of  . Migraine   . Depression   . Helicobacter pylori gastritis 03/27/2011    on  endoscopy, treated with omeprazole, clarithromycin and amoxicillin  . Insomnia   . ACE inhibitor-aggravated angioedema   . Sessile colonic polyp   . Diabetes mellitus   . GERD (gastroesophageal reflux disease)   . Chronic abdominal pain on narcotics 06/27/2012   Social, surgical, family history reviewed with patient and updated in appropriate chart locations.   Review of Systems  Constitutional: Negative for fever, appetite change, fatigue and unexpected weight change.  Respiratory: Negative for cough, chest tightness and shortness of breath.   Cardiovascular: Negative for chest pain, palpitations and leg swelling.  Gastrointestinal: Positive for abdominal pain. Negative for nausea, vomiting, constipation, blood in stool and abdominal distention.  Endocrine: Negative for polydipsia and polyuria.  Neurological: Negative for dizziness, syncope, weakness, light-headedness and headaches.  Psychiatric/Behavioral: Negative for suicidal ideas, hallucinations, sleep disturbance and agitation. The patient is not nervous/anxious.       Objective:   Physical Exam Filed Vitals:   05/09/13 1319  BP: 121/80  Pulse: 83  Temp: 97.2 F (36.2 C)   General: sitting in chair, NAD,  HEENT: PERRL, EOMI, no scleral icterus Cardiac: RRR, no rubs, murmurs or gallops Pulm: clear to auscultation bilaterally, moving normal volumes of air Abd: soft, slight tenderness in epigastrium, nondistended, BS present Ext: warm and well perfused, no pedal edema Neuro: alert and oriented X3, cranial nerves II-XII grossly intact  Assessment & Plan:  Please see problem oriented charting  Pt discussed with Dr. Beryle Beams

## 2013-05-10 NOTE — Assessment & Plan Note (Signed)
Pt has been evaluated by physician at pain clinic given chronic pancreatitis and being fired from [facility] for pain medications. Pt has some chronic shoulder and other joint pain and needs a new referral to see Dr. Lynett Fish.  -pain clinic referral made

## 2013-05-10 NOTE — Assessment & Plan Note (Signed)
Being followed by [facility] at this time. Will send records. Pt found VitD deficient at that visit and put on supplementation could follow up labs here if needed. This was discussed with patient.  -recheck VitD after supplementation completed

## 2013-05-12 NOTE — Progress Notes (Signed)
Problem list, physical findings, medications reviewed with resident physician Dr Clinton Gallant and I concur with management. Murriel Hopper, MD, [facility]  Hematology-Oncology/Internal Medicine

## 2013-06-13 ENCOUNTER — Encounter: Payer: Self-pay | Admitting: Internal Medicine

## 2013-06-13 ENCOUNTER — Ambulatory Visit (INDEPENDENT_AMBULATORY_CARE_PROVIDER_SITE_OTHER): Payer: Medicaid Other | Admitting: Internal Medicine

## 2013-06-13 VITALS — BP 144/84 | HR 106 | Temp 98.2°F | Ht 63.0 in | Wt 202.3 lb

## 2013-06-13 DIAGNOSIS — E1165 Type 2 diabetes mellitus with hyperglycemia: Secondary | ICD-10-CM

## 2013-06-13 DIAGNOSIS — IMO0002 Reserved for concepts with insufficient information to code with codable children: Secondary | ICD-10-CM

## 2013-06-13 DIAGNOSIS — IMO0001 Reserved for inherently not codable concepts without codable children: Secondary | ICD-10-CM

## 2013-06-13 LAB — GLUCOSE, CAPILLARY: Glucose-Capillary: 317 mg/dL — ABNORMAL HIGH (ref 70–99)

## 2013-06-13 NOTE — Progress Notes (Signed)
Subjective:    Patient ID: Michael [name], male    DOB: 1955/01/31, 58 y.o.   MRN: [id number]  HPI Mr. Delpilar is a 58 yo M pmh as listed below here for a DM recheck.   The patient does not bring in his meter but states that he is "having better control." He states that mostly his CBGs are running in the 200s to 400s. He noticed that his NovoLog was working better and therefore discontinued taking his Lantus for anywhere between 3-5 days to "test how good it was working." During that time the patient did not experience any shakes chills, fevers, worsening abdominal pain. He did have some episodes of polydipsia when he was stopping his Lantus. The patient has had recent dietary indiscretions that included several sodas daily and even to before the visit today.he continues to work on maintaining an exercise program to promote active weight loss and changes in his diet.  Otherwise the patient reports compliance with his Creon and vitamins and feels "better than I have in almost forever."  He does continue to smoke but is trying to work on cessation and is excited that he was approved for his disability application and should be moving into an independent living apartment.  Past Medical History  Diagnosis Date  . Chronic pancreatitis   . Smoker   . Gastritis due to alcohol without hemorrhage   . HTN (hypertension)   . Pancreatic lesion     appears innocent and related to chronic pancreatitis  . Heavy alcohol use     hx of  . Marijuana use     hx of  . Migraine   . Depression   . Helicobacter pylori gastritis 03/27/2011    on endoscopy, treated with omeprazole, clarithromycin and amoxicillin  . Insomnia   . ACE inhibitor-aggravated angioedema   . Sessile colonic polyp   . Diabetes mellitus   . GERD (gastroesophageal reflux disease)   . Chronic abdominal pain on narcotics 06/27/2012   Current Outpatient Prescriptions on File Prior to Visit  Medication Sig Dispense Refill  . ACCU-CHEK  FASTCLIX LANCETS MISC 1 each by Does not apply route 3 (three) times daily before meals. Dx code 250.00 insulin requiring  102 each  12  . ARIPiprazole (ABILIFY) 15 MG tablet Take 15 mg by mouth at bedtime. Takes with 5 mg and 2 mg for a 22 mg dose      . ARIPiprazole (ABILIFY) 2 MG tablet Take 2 mg by mouth at bedtime. Takes with 15 mg and 5 mg for a 22 mg dose      . ARIPiprazole (ABILIFY) 5 MG tablet Take 5 mg by mouth at bedtime. Takes with 15 mg and 2 mg for a 22 mg dose      . aspirin EC 81 MG tablet Take 81 mg by mouth at bedtime.      . Blood Glucose Monitoring Suppl (ACCU-CHEK NANO SMARTVIEW) W/DEVICE KIT 1 each by Does not apply route 3 (three) times daily. Dx code 250.00 insulin requiring  1 kit  0  . FLUoxetine (PROZAC) 20 MG capsule Take 20 mg by mouth 2 (two) times daily.       Marland Kitchen glipiZIDE (GLUCOTROL) 10 MG tablet Take 10 mg by mouth 2 (two) times daily before a meal.      . glucose blood (ACCU-CHEK SMARTVIEW) test strip Check blood sugar 3 times a day before meals and bedtime Dx code 250.00 insulin requiring  100 each  12  .  glucose blood (AGAMATRIX PRESTO TEST) test strip Use as instructed  100 each  12  . hydrochlorothiazide (HYDRODIURIL) 25 MG tablet Take 1 tablet (25 mg total) by mouth daily.  90 tablet  4  . hydrOXYzine (ATARAX/VISTARIL) 10 MG tablet Take 10 mg by mouth daily as needed for itching.       . insulin aspart (NOVOLOG) 100 UNIT/ML FlexPen Inject 15 Units into the skin 3 (three) times daily with meals.  15 mL  11  . insulin glargine (LANTUS) 100 UNIT/ML injection Inject 0.6 mLs (60 Units total) into the skin at bedtime.  10 mL  12  . Insulin Pen Needle (B-D UF III MINI PEN NEEDLES) 31G X 5 MM MISC Use to inject insulin 3 times a day dx code 250.00  100 each  12  . lovastatin (MEVACOR) 40 MG tablet Take 1 tablet (40 mg total) by mouth at bedtime.  30 tablet  2  . metFORMIN (GLUCOPHAGE) 500 MG tablet Take 1,000 mg by mouth 2 (two) times daily with a meal.      .  Pancrelipase, Lip-Prot-Amyl, (CREON) 36000 UNITS CPEP Take 36,000 Units by mouth 3 (three) times daily.      . pantoprazole (PROTONIX) 40 MG tablet Take 1 tablet (40 mg total) by mouth daily.  90 tablet  0  . promethazine (PHENERGAN) 25 MG tablet Take 1 tablet (25 mg total) by mouth every 6 (six) hours as needed for nausea.  15 tablet  0   No current facility-administered medications on file prior to visit.   Social, surgical, family history reviewed with patient and updated in appropriate chart locations.   Review of Systems  Constitutional: Negative for fever, activity change, appetite change, fatigue and unexpected weight change.  Respiratory: Negative for cough, chest tightness and shortness of breath.   Cardiovascular: Negative for chest pain, palpitations and leg swelling.  Gastrointestinal: Negative for nausea, abdominal pain, constipation and abdominal distention.  Endocrine: Positive for polydipsia. Negative for polyuria.  Psychiatric/Behavioral: Negative for self-injury and agitation. The patient is not nervous/anxious.        Objective:   Physical Exam Filed Vitals:   06/13/13 1339  BP: 144/84  Pulse: 106  Temp: 98.2 F (36.8 C)   General: sitting in chair, NAD HEENT: PERRL, EOMI, no scleral icterus Cardiac: RRR, no rubs, murmurs or gallops Pulm: clear to auscultation bilaterally, moving normal volumes of air Abd: soft, nontender, nondistended, BS present Ext: warm and well perfused, no pedal edema Neuro: alert and oriented X3, cranial nerves II-XII grossly intact    Assessment & Plan:  Please see problem oriented charting  Pt discussed with Dr. Stann Mainland

## 2013-06-13 NOTE — Patient Instructions (Signed)
We will make some small changes to your diabetes.   Take your Novolog 20 units twice a day.   Limit your soda intake to maybe 1 a day max.   Thank you for bringing your medicines today. This helps [place] keep you safe from mistakes.

## 2013-06-13 NOTE — Assessment & Plan Note (Signed)
Wt Readings from Last 3 Encounters:  06/13/13 202 lb 4.8 oz (91.763 kg)  05/09/13 202 lb (91.627 kg)  03/28/13 206 lb 3.2 oz (93.532 kg)   The patient has been successful in maintaining a steady weight but is still working on losing more to return to work his baseline that was in 180 pounds. The patient continues to not bring in his meter although this is stressed several times that it will help guide management. -The patient is interested in increasing NovoLog therefore from his 15 units twice a day dosing was increased to 20 units twice a day dosing -Will followup with patient -Extensive education and reinforcement of dietary modification including the limitations of sugary drinks such as sodas and bring in his meter was extensively had throughout this visit with the patient

## 2013-06-17 NOTE — Progress Notes (Signed)
Case discussed with Dr. Sadek soon after the resident saw the patient.  We reviewed the resident's history and exam and pertinent patient test results.  I agree with the assessment, diagnosis, and plan of care documented in the resident's note. 

## 2013-06-30 ENCOUNTER — Telehealth: Payer: Self-pay | Admitting: Dietician

## 2013-06-30 NOTE — Telephone Encounter (Signed)
Scheduled for Friday 07/04/13 at 230 for foot and eye exam.

## 2013-07-04 ENCOUNTER — Encounter: Payer: Medicaid Other | Admitting: Dietician

## 2013-07-07 ENCOUNTER — Other Ambulatory Visit: Payer: Self-pay | Admitting: Internal Medicine

## 2013-07-15 ENCOUNTER — Other Ambulatory Visit: Payer: Self-pay | Admitting: *Deleted

## 2013-07-15 DIAGNOSIS — K860 Alcohol-induced chronic pancreatitis: Secondary | ICD-10-CM

## 2013-07-15 NOTE — Telephone Encounter (Signed)
Pt called back asking for refill. Last refilled 03/11/13;written by Dr Algis Liming qty#15 - per [facility]. Thanks

## 2013-07-15 NOTE — Telephone Encounter (Signed)
Pt called/informed of denial.

## 2013-07-16 NOTE — Telephone Encounter (Signed)
Pt informed of refusal; instructed to call back tomorrow to schedule an appt.- pt voiced understanding.

## 2013-08-15 ENCOUNTER — Telehealth: Payer: Self-pay | Admitting: Licensed Clinical Social Worker

## 2013-08-15 NOTE — Telephone Encounter (Signed)
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

## 2013-08-15 NOTE — Telephone Encounter (Signed)
Michael [name] placed call to CSW this morning.  Pt states he obtained the keys to his new apartment through [facility].  Michael [name] requesting CSW to update information in EMR.

## 2013-08-20 ENCOUNTER — Encounter: Payer: Self-pay | Admitting: Licensed Clinical Social Worker

## 2013-08-20 NOTE — Progress Notes (Signed)
Patient ID: Michael [name], male   DOB: 1955/10/07, 58 y.o.   MRN: [id number] [name] presents this afternoon to the [facility] to obtain listing of agencies affiliated with [employer].  CSW provided listing.  During this encounter, [name] discuss needing a new referral for pain management.  Pt states he has been 3 months without pain medications.  [name] complained of previous pain mgmt agency stating "I had to jump through too many hoops."  Pt states he was in a room while other were smoking marijuana and the pain management agency had an issue with him being present.  Pt has found another agency in [facility].  [name] requesting referral to this agency.  Pt also states he needs refills on his vitamins, stating they were originally prescribed by the specialist in [facility].  [name] does have an appointment this Friday with his PCP.  CSW encouraged [name] to bring all medications he is taking so that PCP is aware.  [name] aware he will need to discuss medication refills and referrals during [facility] appt with PCP.

## 2013-08-22 ENCOUNTER — Ambulatory Visit (INDEPENDENT_AMBULATORY_CARE_PROVIDER_SITE_OTHER): Payer: Medicaid Other | Admitting: Internal Medicine

## 2013-08-22 ENCOUNTER — Encounter: Payer: Self-pay | Admitting: Internal Medicine

## 2013-08-22 VITALS — BP 126/89 | HR 102 | Temp 97.2°F | Ht 65.0 in | Wt 192.8 lb

## 2013-08-22 DIAGNOSIS — IMO0001 Reserved for inherently not codable concepts without codable children: Secondary | ICD-10-CM

## 2013-08-22 DIAGNOSIS — E1165 Type 2 diabetes mellitus with hyperglycemia: Secondary | ICD-10-CM

## 2013-08-22 DIAGNOSIS — E559 Vitamin D deficiency, unspecified: Secondary | ICD-10-CM

## 2013-08-22 DIAGNOSIS — G894 Chronic pain syndrome: Secondary | ICD-10-CM

## 2013-08-22 DIAGNOSIS — I10 Essential (primary) hypertension: Secondary | ICD-10-CM

## 2013-08-22 DIAGNOSIS — IMO0002 Reserved for concepts with insufficient information to code with codable children: Secondary | ICD-10-CM

## 2013-08-22 DIAGNOSIS — K860 Alcohol-induced chronic pancreatitis: Secondary | ICD-10-CM

## 2013-08-22 LAB — POCT GLYCOSYLATED HEMOGLOBIN (HGB A1C): Hemoglobin A1C: 13.3

## 2013-08-22 LAB — GLUCOSE, CAPILLARY: Glucose-Capillary: 116 mg/dL — ABNORMAL HIGH (ref 70–99)

## 2013-08-22 MED ORDER — PROMETHAZINE HCL 25 MG PO TABS: 25.0000 mg | ORAL_TABLET | Freq: Four times a day (QID) | ORAL | Status: DC | PRN

## 2013-08-22 MED ORDER — INSULIN GLARGINE 100 UNIT/ML ~~LOC~~ SOLN: 60.0000 [IU] | Freq: Every day | SUBCUTANEOUS | Status: DC

## 2013-08-22 MED ORDER — INSULIN GLARGINE 100 UNIT/ML ~~LOC~~ SOLN: 65.0000 [IU] | Freq: Every day | SUBCUTANEOUS | Status: DC

## 2013-08-22 MED ORDER — HYDROCHLOROTHIAZIDE 25 MG PO TABS: 25.0000 mg | ORAL_TABLET | Freq: Every day | ORAL | Status: DC

## 2013-08-22 NOTE — Assessment & Plan Note (Signed)
Pt would like new pain management center.  -referral made for pain clinic

## 2013-08-22 NOTE — Assessment & Plan Note (Signed)
Pt was recently checked at 2/15 visit at [facility] and put on supplements. Pt has not completed treatment yet at this time.  -re-eval Vit D 25-OH level once complete (in about 3-4 wks)

## 2013-08-22 NOTE — Assessment & Plan Note (Signed)
BP Readings from Last 3 Encounters:  08/22/13 126/89  06/13/13 144/84  05/09/13 121/80    Lab Results  Component Value Date   NA 137 10/21/2012   K 3.6 10/21/2012   CREATININE 0.57 10/21/2012    Assessment: Blood pressure control:   Progress toward BP goal:    Comments: continues to be well controlled  Plan: Medications:  continue HCTZ 25mg  daily Educational resources provided: brochure Self management tools provided:   Other plans:

## 2013-08-22 NOTE — Patient Instructions (Signed)
General Instructions: For your vitamin D level we will get your lab redrawn once you have finished your pills. This is about in 1 month.   We have given you refills on your lantus and nausea medicine. Keep up the good work losing weight!   Thank you for bringing your medicines today. This helps [place] keep you safe from mistakes.   Progress Toward Treatment Goals:  Treatment Goal 08/12/2012  Hemoglobin A1C at goal  Blood pressure improved  Stop smoking -  Prevent falls -    Self Care Goals & Plans:  Self Care Goal 08/22/2013  Manage my medications take my medicines as prescribed; bring my medications to every visit; refill my medications on time  Monitor my health keep track of my blood glucose; bring my glucose meter and log to each visit  Eat healthy foods drink diet soda or water instead of juice or soda; eat more vegetables; eat foods that are low in salt; eat baked foods instead of fried foods; eat fruit for snacks and desserts  Be physically active -  Stop smoking -    Home Blood Glucose Monitoring 09/03/2012  Check my blood sugar 4 times a day  When to check my blood sugar -     Care Management & Community Referrals:  Referral 02/03/2013  Referrals made to [facility] nutrition; other (see comments)

## 2013-08-22 NOTE — Assessment & Plan Note (Addendum)
Lab Results  Component Value Date   HGBA1C 13.3 08/22/2013   HGBA1C 11.1 05/09/2013   HGBA1C 13.7 12/13/2012     Assessment: Diabetes control:  deteriorated Progress toward A1C goal:   worsened Comments: patient has several limiting factors good glycemic control these have included periods of homelessness, periods of inability to access food, and both medical and educational  Illiteracy. But pt does bring in all medication with him at every visit and does appear to be taking insulin as prescribed   Plan: Medications:  Increase Lantus to 65 units q. at bedtime, continue NovoLog 20 units twice a day with largest meals, and continue metformin 1000 mg twice a day and glipizide 10 mg twice a day Home glucose monitoring: Frequency:   Timing:   Instruction/counseling given: reminded to bring blood glucose meter & log to each visit, reminded to bring medications to each visit, discussed foot care and discussed diet. Foot exam was also completed this visit. Educational resources provided: brochure Self management tools provided: copy of home glucose meter download;home glucose logbook Other plans: pt continues to make lifestyle modifications and is actively losing weight this was strongly encouraged and the patient was congratulated on his efforts and results, f/u in 1 month. Given severity and worsening glycemic control in light of maximal therapy and reported compliance will refer to endocrinology.  Wt Readings from Last 3 Encounters:  08/22/13 192 lb 12.8 oz (87.454 kg)  06/13/13 202 lb 4.8 oz (91.763 kg)  05/09/13 202 lb (91.627 kg)

## 2013-08-22 NOTE — Progress Notes (Signed)
Subjective:    Patient ID: Michael [name], male    DOB: 05/26/1955, 58 y.o.   MRN: [id number]  HPI Michael [name] is a 57 yo man pmh as listed below presents for DM recheck.   Diabetic Review of Systems - medication compliance: compliant most of the time, pt is illiterate and it has been very hard to advance number of injections for the patient that better fit his eating habits given complexity. diabetic diet compliance: compliant most of the time, patient continues to make active lifestyle modifications and has been losing weight. home glucose monitoring: is performed sporadically recently given his new move to an independent living space. further diabetic ROS: no chest pain, dyspnea or TIA's, no numbness, tingling or pain in extremities, no unusual visual symptoms, no hypoglycemia, has noted excessive thirstiness and frequent urination, weight has decreased.    Other symptoms and concerns: pt would like to change his pain management physician. Pt also will be finishing up some vitamin d supplements and was concerned whether he needs a refill or just a lab check as this had been addressed at [facility] when managing his pancreatitis.  Past Medical History  Diagnosis Date  . Chronic pancreatitis   . Smoker   . Gastritis due to alcohol without hemorrhage   . HTN (hypertension)   . Pancreatic lesion     appears innocent and related to chronic pancreatitis  . Heavy alcohol use     hx of  . Marijuana use     hx of  . Migraine   . Depression   . Helicobacter pylori gastritis 03/27/2011    on endoscopy, treated with omeprazole, clarithromycin and amoxicillin  . Insomnia   . ACE inhibitor-aggravated angioedema   . Sessile colonic polyp   . Diabetes mellitus   . GERD (gastroesophageal reflux disease)   . Chronic abdominal pain on narcotics 06/27/2012    Current Outpatient Prescriptions  Medication Sig Dispense Refill  . ACCU-CHEK FASTCLIX LANCETS MISC 1 each by Does not apply  route 3 (three) times daily before meals. Dx code 250.00 insulin requiring  102 each  12  . ARIPiprazole (ABILIFY) 15 MG tablet Take 15 mg by mouth at bedtime. Takes with 5 mg and 2 mg for a 22 mg dose      . ARIPiprazole (ABILIFY) 2 MG tablet Take 2 mg by mouth at bedtime. Takes with 15 mg and 5 mg for a 22 mg dose      . ARIPiprazole (ABILIFY) 5 MG tablet Take 5 mg by mouth at bedtime. Takes with 15 mg and 2 mg for a 22 mg dose      . aspirin EC 81 MG tablet Take 81 mg by mouth at bedtime.      . Blood Glucose Monitoring Suppl (ACCU-CHEK NANO SMARTVIEW) W/DEVICE KIT 1 each by Does not apply route 3 (three) times daily. Dx code 250.00 insulin requiring  1 kit  0  . FLUoxetine (PROZAC) 20 MG capsule Take 20 mg by mouth 2 (two) times daily.       Marland Kitchen glipiZIDE (GLUCOTROL) 10 MG tablet Take 10 mg by mouth 2 (two) times daily before a meal.      . glucose blood (ACCU-CHEK SMARTVIEW) test strip Check blood sugar 3 times a day before meals and bedtime Dx code 250.00 insulin requiring  100 each  12  . glucose blood (AGAMATRIX PRESTO TEST) test strip Use as instructed  100 each  12  .  hydrochlorothiazide (HYDRODIURIL) 25 MG tablet Take 1 tablet (25 mg total) by mouth daily.  90 tablet  4  . hydrOXYzine (ATARAX/VISTARIL) 10 MG tablet Take 10 mg by mouth daily as needed for itching.       . insulin aspart (NOVOLOG) 100 UNIT/ML FlexPen Inject 15 Units into the skin 3 (three) times daily with meals.  15 mL  11  . insulin glargine (LANTUS) 100 UNIT/ML injection Inject 0.65 mLs (65 Units total) into the skin at bedtime.  10 mL  12  . Insulin Pen Needle (B-D UF III MINI PEN NEEDLES) 31G X 5 MM MISC Use to inject insulin 3 times a day dx code 250.00  100 each  12  . lovastatin (MEVACOR) 40 MG tablet Take 1 tablet (40 mg total) by mouth at bedtime.  30 tablet  2  . metFORMIN (GLUCOPHAGE) 500 MG tablet Take 1,000 mg by mouth 2 (two) times daily with a meal.      . Pancrelipase, Lip-Prot-Amyl, (CREON) 36000 UNITS CPEP  Take 36,000 Units by mouth 3 (three) times daily.      . pantoprazole (PROTONIX) 40 MG tablet Take 1 tablet (40 mg total) by mouth daily.  90 tablet  0  . promethazine (PHENERGAN) 25 MG tablet Take 1 tablet (25 mg total) by mouth every 6 (six) hours as needed for nausea.  15 tablet  0   No current facility-administered medications for this visit.   Social, surgical, family history reviewed with patient and updated in appropriate chart locations.   Review of Systems Complete 10 pt ROS was performed and all pertinent listed in HPI.     Objective:   Physical Exam Filed Vitals:   08/22/13 1324  BP: 126/89  Pulse: 102  Temp: 97.2 F (36.2 C)   General: NAD, overweight HEENT: PERRL, EOMI, no scleral icterus Cardiac: RRR, no rubs, murmurs or gallops Pulm: clear to auscultation bilaterally, moving normal volumes of air Abd: soft, nontender, nondistended, BS present Ext: warm and well perfused, no pedal edema Neuro: alert and oriented X3, cranial nerves II-XII grossly intact feet: warm, good capillary refill, normal DP and PT pulses and normal sensory exam    Assessment & Plan:  Please see problem oriented charting  Pt discussed with Dr. Marinda Elk

## 2013-08-24 MED ORDER — INSULIN ASPART 100 UNIT/ML FLEXPEN: 20.0000 [IU] | PEN_INJECTOR | Freq: Two times a day (BID) | SUBCUTANEOUS | Status: DC

## 2013-08-25 NOTE — Progress Notes (Signed)
Case discussed with Dr. Sadek soon after the resident saw the patient.  We reviewed the resident's history and exam and pertinent patient test results.  I agree with the assessment, diagnosis, and plan of care documented in the resident's note. 

## 2013-08-28 ENCOUNTER — Other Ambulatory Visit: Payer: Self-pay | Admitting: Dietician

## 2013-08-28 DIAGNOSIS — IMO0002 Reserved for concepts with insufficient information to code with codable children: Secondary | ICD-10-CM

## 2013-08-28 DIAGNOSIS — E1165 Type 2 diabetes mellitus with hyperglycemia: Secondary | ICD-10-CM

## 2013-08-28 MED ORDER — INSULIN GLARGINE 100 UNIT/ML SOLOSTAR PEN: PEN_INJECTOR | SUBCUTANEOUS | Status: DC

## 2013-08-28 NOTE — Telephone Encounter (Signed)
Patient presented to office requesting his insulin Rx be changed to pen form rather than vials. CDE offered to teach him how to use syringe and vial since that is what he got from pharmacy, but patient refused saying he wanted it change to insulin pens.

## 2013-10-02 ENCOUNTER — Other Ambulatory Visit: Payer: Self-pay | Admitting: Internal Medicine

## 2013-10-03 ENCOUNTER — Other Ambulatory Visit: Payer: Self-pay | Admitting: Internal Medicine

## 2013-10-07 ENCOUNTER — Telehealth: Payer: Self-pay | Admitting: Licensed Clinical Social Worker

## 2013-10-07 NOTE — Telephone Encounter (Signed)
Per pt request, CSW faxed referral to [facility].  Mr. Pfeifle has been attempting to find furniture for his new apartment.  Pt in need of referral to [facility].  Pt in agreement with a referral to [facility].

## 2013-10-22 ENCOUNTER — Other Ambulatory Visit: Payer: Self-pay | Admitting: Internal Medicine

## 2013-10-24 NOTE — Addendum Note (Signed)
Addended by: Truddie Crumble on: 10/24/2013 01:36 PM   Modules accepted: Orders

## 2013-10-30 ENCOUNTER — Telehealth: Payer: Self-pay | Admitting: Licensed Clinical Social Worker

## 2013-10-30 NOTE — Telephone Encounter (Signed)
[name] placed call to CSW requesting contact at [facility].  Pt agitated and voiced frustration over current living environment. [name] states he does not want to live there anymore.  Pt to go to [facility] to request move.  CSW encouraged [name] to remain calm and discuss his concerns without anger.  Encouraged pt to resume counseling at [facility], as pt states he has not been in over 3 weeks due to not feeling well.

## 2013-10-31 ENCOUNTER — Encounter: Payer: Self-pay | Admitting: Internal Medicine

## 2013-10-31 ENCOUNTER — Encounter: Payer: Self-pay | Admitting: *Deleted

## 2013-10-31 ENCOUNTER — Ambulatory Visit: Payer: Medicaid Other | Admitting: Internal Medicine

## 2013-11-04 ENCOUNTER — Encounter (HOSPITAL_COMMUNITY): Payer: Self-pay | Admitting: Emergency Medicine

## 2013-11-04 ENCOUNTER — Emergency Department (HOSPITAL_COMMUNITY)
Admission: EM | Admit: 2013-11-04 | Discharge: 2013-11-04 | Disposition: A | Discharge status: Home / Self Care | Payer: Medicaid Other | Attending: Emergency Medicine | Admitting: Emergency Medicine

## 2013-11-04 DIAGNOSIS — I10 Essential (primary) hypertension: Secondary | ICD-10-CM | POA: Diagnosis not present

## 2013-11-04 DIAGNOSIS — G8929 Other chronic pain: Secondary | ICD-10-CM | POA: Diagnosis not present

## 2013-11-04 DIAGNOSIS — F329 Major depressive disorder, single episode, unspecified: Secondary | ICD-10-CM | POA: Diagnosis not present

## 2013-11-04 DIAGNOSIS — Z8619 Personal history of other infectious and parasitic diseases: Secondary | ICD-10-CM | POA: Diagnosis not present

## 2013-11-04 DIAGNOSIS — Z86010 Personal history of colonic polyps: Secondary | ICD-10-CM | POA: Insufficient documentation

## 2013-11-04 DIAGNOSIS — E1165 Type 2 diabetes mellitus with hyperglycemia: Secondary | ICD-10-CM | POA: Insufficient documentation

## 2013-11-04 DIAGNOSIS — R109 Unspecified abdominal pain: Secondary | ICD-10-CM

## 2013-11-04 DIAGNOSIS — K859 Acute pancreatitis, unspecified: Secondary | ICD-10-CM | POA: Diagnosis present

## 2013-11-04 DIAGNOSIS — K219 Gastro-esophageal reflux disease without esophagitis: Secondary | ICD-10-CM | POA: Insufficient documentation

## 2013-11-04 DIAGNOSIS — R10812 Left upper quadrant abdominal tenderness: Secondary | ICD-10-CM | POA: Insufficient documentation

## 2013-11-04 DIAGNOSIS — Z720 Tobacco use: Secondary | ICD-10-CM | POA: Diagnosis not present

## 2013-11-04 DIAGNOSIS — Z7982 Long term (current) use of aspirin: Secondary | ICD-10-CM | POA: Insufficient documentation

## 2013-11-04 DIAGNOSIS — R739 Hyperglycemia, unspecified: Secondary | ICD-10-CM

## 2013-11-04 DIAGNOSIS — Z8669 Personal history of other diseases of the nervous system and sense organs: Secondary | ICD-10-CM | POA: Insufficient documentation

## 2013-11-04 DIAGNOSIS — Z87828 Personal history of other (healed) physical injury and trauma: Secondary | ICD-10-CM | POA: Diagnosis not present

## 2013-11-04 DIAGNOSIS — Z79899 Other long term (current) drug therapy: Secondary | ICD-10-CM | POA: Diagnosis not present

## 2013-11-04 DIAGNOSIS — Z794 Long term (current) use of insulin: Secondary | ICD-10-CM | POA: Insufficient documentation

## 2013-11-04 LAB — COMPREHENSIVE METABOLIC PANEL
ALBUMIN: 3.7 g/dL (ref 3.5–5.2)
ALK PHOS: 133 U/L — AB (ref 39–117)
ALT: 19 U/L (ref 0–53)
AST: 16 U/L (ref 0–37)
Anion gap: 14 (ref 5–15)
BUN: 11 mg/dL (ref 6–23)
CALCIUM: 9.3 mg/dL (ref 8.4–10.5)
CO2: 23 mEq/L (ref 19–32)
Chloride: 101 mEq/L (ref 96–112)
Creatinine, Ser: 0.66 mg/dL (ref 0.50–1.35)
GFR calc non Af Amer: >90 mL/min (ref >90)
Glucose, Bld: 307 mg/dL — ABNORMAL HIGH (ref 70–99)
POTASSIUM: 3.9 meq/L (ref 3.7–5.3)
SODIUM: 138 meq/L (ref 137–147)
TOTAL PROTEIN: 7 g/dL (ref 6.0–8.3)
Total Bilirubin: 0.6 mg/dL (ref 0.3–1.2)

## 2013-11-04 LAB — CBC WITH DIFFERENTIAL/PLATELET
Basophils Absolute: 0 10*3/uL (ref 0.0–0.1)
Basophils Relative: 0 % (ref 0–1)
EOS ABS: 0.2 10*3/uL (ref 0.0–0.7)
Eosinophils Relative: 3 % (ref 0–5)
HCT: 44 % (ref 39.0–52.0)
HEMOGLOBIN: 16 g/dL (ref 13.0–17.0)
LYMPHS ABS: 1.6 10*3/uL (ref 0.7–4.0)
Lymphocytes Relative: 18 % (ref 12–46)
MCH: 33.9 pg (ref 26.0–34.0)
MCHC: 36.4 g/dL — ABNORMAL HIGH (ref 30.0–36.0)
MCV: 93.2 fL (ref 78.0–100.0)
MONO ABS: 0.8 10*3/uL (ref 0.1–1.0)
MONOS PCT: 9 % (ref 3–12)
NEUTROS PCT: 70 % (ref 43–77)
Neutro Abs: 6.3 10*3/uL (ref 1.7–7.7)
Platelets: 153 10*3/uL (ref 150–400)
RBC: 4.72 MIL/uL (ref 4.22–5.81)
RDW: 12.7 % (ref 11.5–15.5)
WBC: 9 10*3/uL (ref 4.0–10.5)

## 2013-11-04 LAB — URINALYSIS, ROUTINE W REFLEX MICROSCOPIC
BILIRUBIN URINE: NEGATIVE
Glucose, UA: >1000 mg/dL — AB
HGB URINE DIPSTICK: NEGATIVE
Ketones, ur: 15 mg/dL — AB
Leukocytes, UA: NEGATIVE
Nitrite: NEGATIVE
PROTEIN: NEGATIVE mg/dL
SPECIFIC GRAVITY, URINE: 1.037 — AB (ref 1.005–1.030)
Urobilinogen, UA: 0.2 mg/dL (ref 0.0–1.0)
pH: 5.5 (ref 5.0–8.0)

## 2013-11-04 LAB — RAPID URINE DRUG SCREEN, HOSP PERFORMED
AMPHETAMINES: NOT DETECTED
Barbiturates: NOT DETECTED
Benzodiazepines: NOT DETECTED
Cocaine: NOT DETECTED
Opiates: NOT DETECTED
TETRAHYDROCANNABINOL: NOT DETECTED

## 2013-11-04 LAB — URINE MICROSCOPIC-ADD ON

## 2013-11-04 LAB — LIPASE, BLOOD: Lipase: 88 U/L — ABNORMAL HIGH (ref 11–59)

## 2013-11-04 LAB — CBG MONITORING, ED: GLUCOSE-CAPILLARY: 276 mg/dL — AB (ref 70–99)

## 2013-11-04 LAB — I-STAT TROPONIN, ED: Troponin i, poc: 0 ng/mL (ref 0.00–0.08)

## 2013-11-04 LAB — ETHANOL: Alcohol, Ethyl (B): <11 mg/dL (ref 0–11)

## 2013-11-04 MED ORDER — OXYCODONE-ACETAMINOPHEN 5-325 MG PO TABS
1.0000 | ORAL_TABLET | Freq: Once | ORAL | Status: AC
  Administered 2013-11-04: 1 via ORAL
  Filled 2013-11-04: qty 1

## 2013-11-04 NOTE — ED Provider Notes (Signed)
CSN: [id number]     Arrival date & time 11/04/13  1053 History   First MD Initiated Contact with Patient 11/04/13 1129     Chief Complaint  Patient presents with  . Abdominal Pain  . Pancreatitis     (Consider location/radiation/quality/duration/timing/severity/associated sxs/prior Treatment) HPI  58 year old male with history of alcohol abuse and history of chronic alcohol pancreatitis, H. pylori, diabetes, GERD presents for evaluation of abdominal pain. Patient states he reason that he is here today is because he is in transition from his previous pain management clinic to a different pain management clinic and does not have pain medication for his chronic abdominal pain in the meantime. He is complaining of sharp achy pain to his left upper quadrant that radiates to his back. Pain is chronic unless he takes his pain medication. Reports occasional nausea and vomiting and states he vomits twice monthly. No complaints of fever, chills, chest pain, shortness of breath, productive cough, heartburn, dysuria, hematuria, hematochezia or melena. States he has not been drinking any alcohol except a beer several weeks ago. Denies any street drug use. Patient report his next appointment with his doctor is October 30th and he would like to have pain medication to cover until then. Report having normal appetite  Past Medical History  Diagnosis Date  . Chronic pancreatitis   . Smoker   . Gastritis due to alcohol without hemorrhage   . HTN (hypertension)   . Pancreatic lesion     appears innocent and related to chronic pancreatitis  . Heavy alcohol use     hx of  . Marijuana use     hx of  . Migraine   . Depression   . Helicobacter pylori gastritis 03/27/2011    on endoscopy, treated with omeprazole, clarithromycin and amoxicillin  . Insomnia   . ACE inhibitor-aggravated angioedema   . Sessile colonic polyp   . Diabetes mellitus   . GERD (gastroesophageal reflux disease)   . Chronic abdominal  pain on narcotics 06/27/2012   Past Surgical History  Procedure Laterality Date  . Esophagogastroduodenoscopy  03/22/2011    Procedure: ESOPHAGOGASTRODUODENOSCOPY (EGD);  Surgeon: Gatha Mayer, MD;  Location: [facility];  Service: Endoscopy;  Laterality: N/A;  egd with balloon   . Balloon dilation  03/22/2011    Procedure: BALLOON DILATION;  Surgeon: Gatha Mayer, MD;  Location: [facility];  Service: Endoscopy;  Laterality: N/A;  . Eus  04/20/2011    Procedure: UPPER ENDOSCOPIC ULTRASOUND (EUS) LINEAR;  Surgeon: Milus Banister, MD;  Location: [facility];  Service: Endoscopy;  Laterality: N/A;  . Colonoscopy w/ biopsies and polypectomy  09/12/11  . Colonoscopy N/A 05/22/2012    Procedure: COLONOSCOPY;  Surgeon: Gatha Mayer, MD;  Location: [place];  Service: Endoscopy;  Laterality: N/A;   Family History  Problem Relation Age of Onset  . Colon cancer Neg Hx   . Stomach cancer Neg Hx   . Anesthesia problems Neg Hx   . Hypotension Neg Hx   . Pseudochol deficiency Neg Hx   . Malignant hyperthermia Neg Hx   . Hypertension Sister   . Diabetes Sister   . Heart disease Sister    History  Substance Use Topics  . Smoking status: Current Every Day Smoker -- 0.20 packs/day for 30 years    Types: Cigarettes  . Smokeless tobacco: Never Used     Comment: 4 -5 cigs/day.Wants to quit.  . Alcohol Use: No     Comment: sobriety since 2/13,was  drinking 7-10 beer/daily    Review of Systems  All other systems reviewed and are negative.     Allergies  Ace inhibitors and Losartan  Home Medications   Prior to Admission medications   Medication Sig Start Date End Date Taking? Authorizing Provider  ACCU-CHEK AVIVA PLUS test strip check blood sugar THREE TIMES DAILY BEFORE MEALS AND AT BEDTIME 10/23/13   Clinton Gallant, MD  ACCU-CHEK FASTCLIX LANCETS MISC 1 each by Does not apply route 3 (three) times daily before meals. Dx code 250.00 insulin requiring 10/07/12   Dominic Pea, DO   ARIPiprazole (ABILIFY) 15 MG tablet Take 15 mg by mouth at bedtime. Takes with 5 mg and 2 mg for a 22 mg dose    Historical Provider, MD  ARIPiprazole (ABILIFY) 2 MG tablet Take 2 mg by mouth at bedtime. Takes with 15 mg and 5 mg for a 22 mg dose    Historical Provider, MD  ARIPiprazole (ABILIFY) 5 MG tablet Take 5 mg by mouth at bedtime. Takes with 15 mg and 2 mg for a 22 mg dose    Historical Provider, MD  aspirin EC 81 MG tablet Take 81 mg by mouth at bedtime.    Historical Provider, MD  Blood Glucose Monitoring Suppl (ACCU-CHEK NANO SMARTVIEW) W/DEVICE KIT 1 each by Does not apply route 3 (three) times daily. Dx code 250.00 insulin requiring 10/07/12   Dominic Pea, DO  FLUoxetine (PROZAC) 20 MG capsule Take 20 mg by mouth 2 (two) times daily.     Historical Provider, MD  glipiZIDE (GLUCOTROL) 10 MG tablet Take 10 mg by mouth 2 (two) times daily before a meal. 07/22/12   Clinton Gallant, MD  glucose blood (ACCU-CHEK AVIVA PLUS) test strip Check CBG TID 10/03/13   Clinton Gallant, MD  glucose blood (ACCU-CHEK SMARTVIEW) test strip Check blood sugar 3 times a day before meals and bedtime Dx code 250.00 insulin requiring 10/07/12   Dominic Pea, DO  glucose blood (AGAMATRIX PRESTO TEST) test strip Use as instructed 12/06/12   Madilyn Fireman, MD  hydrochlorothiazide (HYDRODIURIL) 25 MG tablet Take 1 tablet (25 mg total) by mouth daily. 08/22/13   Clinton Gallant, MD  hydrOXYzine (ATARAX/VISTARIL) 10 MG tablet Take 10 mg by mouth daily as needed for itching.     Historical Provider, MD  insulin aspart (NOVOLOG) 100 UNIT/ML FlexPen Inject 20 Units into the skin 2 (two) times daily. 08/24/13   Clinton Gallant, MD  Insulin Glargine (LANTUS SOLOSTAR) 100 UNIT/ML Solostar Pen Inject 0.65 mLs (65 Units total) into the skin at bedtime 08/28/13   Clinton Gallant, MD  insulin glargine (LANTUS) 100 UNIT/ML injection Inject 0.65 mLs (65 Units total) into the skin at bedtime. 08/22/13 08/22/14  Clinton Gallant, MD  Insulin Pen Needle (B-D UF III  MINI PEN NEEDLES) 31G X 5 MM MISC Use to inject insulin 3 times a day dx code 250.00 10/22/12   Clinton Gallant, MD  lovastatin (MEVACOR) 40 MG tablet Take 1 tablet (40 mg total) by mouth at bedtime. 02/07/13   Clinton Gallant, MD  metFORMIN (GLUCOPHAGE) 500 MG tablet Take 1,000 mg by mouth 2 (two) times daily with a meal.    Historical Provider, MD  Pancrelipase, Lip-Prot-Amyl, (CREON) 36000 UNITS CPEP Take 36,000 Units by mouth 3 (three) times daily.    Historical Provider, MD  pantoprazole (PROTONIX) 40 MG tablet Take 1 tablet (40 mg total) by mouth daily. 02/07/13   Clinton Gallant, MD  promethazine (PHENERGAN) 25 MG tablet Take 1  tablet (25 mg total) by mouth every 6 (six) hours as needed for nausea. 08/22/13   Clinton Gallant, MD   BP 130/84  Pulse 89  Temp(Src) 98.7 F (37.1 C) (Oral)  Resp 16  Ht _0  (1.651 m)  Wt 189 lb (85.73 kg)  BMI 31.45 kg/m2  SpO2 98% Physical Exam  Nursing note and vitals reviewed. Constitutional: He is oriented to person, place, and time. He appears well-developed and well-nourished. No distress.  HENT:  Head: Atraumatic.  Eyes: Conjunctivae are normal.  Neck: Normal range of motion. Neck supple.  Cardiovascular: Normal rate and regular rhythm.   Pulmonary/Chest: Effort normal and breath sounds normal.  Abdominal: Soft. Bowel sounds are normal. There is tenderness (Mild left upper quadrant tenderness without guarding or rebound tenderness.). There is no rebound and no guarding.  Genitourinary:  No CVA tenderness  Neurological: He is alert and oriented to person, place, and time.  Skin: No rash noted.  Psychiatric: He has a normal mood and affect.    ED Course  Procedures (including critical care time)   12:48 PM Patient here with chronic abdominal pain, here requesting for pain medication. He has been soft and mildly left upper quadrant tenderness on exam but no peritoneal sign. He is afebrile with stable normal vital sign. He is in no acute distress and does not  present as a picture of acute pancreatitis. Pt did not take his insulin this AM.    1:39 PM EKG and troponin is unremarkable, UA without evidence of urinary tract infection, lipase is mildly elevated at 88 not far from his baseline, his electrolytes are reassuring. Glucose is elevated at 307, however patient did not take his insulin this morning. Alcohol is undetectable. Given his labs and exam, I will provide patient with one dose of pain medication he in the ER. I recommend patient to follow up closely with his PCP for further management of his chronic pain. Patient is aware that he cannot receive narcotic pain medication without his doctor's approval as it may violate his pain management contract.    Labs Review Labs Reviewed  LIPASE, BLOOD - Abnormal; Notable for the following:    Lipase 88 (*)    All other components within normal limits  COMPREHENSIVE METABOLIC PANEL - Abnormal; Notable for the following:    Glucose, Bld 307 (*)    Alkaline Phosphatase 133 (*)    All other components within normal limits  CBC WITH DIFFERENTIAL - Abnormal; Notable for the following:    MCHC 36.4 (*)    All other components within normal limits  URINALYSIS, ROUTINE W REFLEX MICROSCOPIC - Abnormal; Notable for the following:    Specific Gravity, Urine 1.037 (*)    Glucose, UA >1000 (*)    Ketones, ur 15 (*)    All other components within normal limits  URINE MICROSCOPIC-ADD ON - Abnormal; Notable for the following:    Crystals CA OXALATE CRYSTALS (*)    All other components within normal limits  CBG MONITORING, ED - Abnormal; Notable for the following:    Glucose-Capillary 276 (*)    All other components within normal limits  URINE RAPID DRUG SCREEN (HOSP PERFORMED)  ETHANOL  I-STAT TROPOININ, ED    Imaging Review No results found.   EKG Interpretation None      Date: 11/04/2013  Rate: 56  Rhythm: normal sinus rhythm  QRS Axis: normal  Intervals: normal  ST/T Wave abnormalities:  nonspecific ST changes  Conduction Disutrbances:none  Narrative  Interpretation:   Old EKG Reviewed: unchanged    MDM   Final diagnoses:  Chronic abdominal pain  Hyperglycemia    BP 130/84  Pulse 89  Temp(Src) 98.7 F (37.1 C) (Oral)  Resp 16  Ht _0  (1.651 m)  Wt 189 lb (85.73 kg)  BMI 31.45 kg/m2  SpO2 98%  I have reviewed nursing notes and vital signs. I personally reviewed the imaging tests through PACS system  I reviewed available ER/hospitalization records thought the EMR     Domenic Moras, [place] 11/04/13 1341

## 2013-11-04 NOTE — ED Notes (Signed)
Sent urine as Add On

## 2013-11-04 NOTE — ED Notes (Signed)
Pt not brought to room yet.

## 2013-11-04 NOTE — Discharge Instructions (Signed)
Please take your diabetic medication as scheduled.  Follow up with your doctor for further management of your chronic abdominal pain.    Chronic Pain Chronic pain can be defined as pain that is off and on and lasts for 3-6 months or longer. Many things cause chronic pain, which can make it difficult to make a diagnosis. There are many treatment options available for chronic pain. However, finding a treatment that works well for you may require trying various approaches until the right one is found. Many people benefit from a combination of two or more types of treatment to control their pain. SYMPTOMS  Chronic pain can occur anywhere in the body and can range from mild to very severe. Some types of chronic pain include:  Headache.  Low back pain.  Cancer pain.  Arthritis pain.  Neurogenic pain. This is pain resulting from damage to nerves. People with chronic pain may also have other symptoms such as:  Depression.  Anger.  Insomnia.  Anxiety. DIAGNOSIS  Your health care provider will help diagnose your condition over time. In many cases, the initial focus will be on excluding possible conditions that could be causing the pain. Depending on your symptoms, your health care provider may order tests to diagnose your condition. Some of these tests may include:   Blood tests.   CT scan.   MRI.   X-rays.   Ultrasounds.   Nerve conduction studies.  You may need to see a specialist.  TREATMENT  Finding treatment that works well may take time. You may be referred to a pain specialist. He or she may prescribe medicine or therapies, such as:   Mindful meditation or yoga.  Shots (injections) of numbing or pain-relieving medicines into the spine or area of pain.  Local electrical stimulation.  Acupuncture.   Massage therapy.   Aroma, color, light, or sound therapy.   Biofeedback.   Working with a physical therapist to keep from getting stiff.   Regular, gentle  exercise.   Cognitive or behavioral therapy.   Group support.  Sometimes, surgery may be recommended.  HOME CARE INSTRUCTIONS   Take all medicines as directed by your health care provider.   Lessen stress in your life by relaxing and doing things such as listening to calming music.   Exercise or be active as directed by your health care provider.   Eat a healthy diet and include things such as vegetables, fruits, fish, and lean meats in your diet.   Keep all follow-up appointments with your health care provider.   Attend a support group with others suffering from chronic pain. SEEK MEDICAL CARE IF:   Your pain gets worse.   You develop a new pain that was not there before.   You cannot tolerate medicines given to you by your health care provider.   You have new symptoms since your last visit with your health care provider.  SEEK IMMEDIATE MEDICAL CARE IF:   You feel weak.   You have decreased sensation or numbness.   You lose control of bowel or bladder function.   Your pain suddenly gets much worse.   You develop shaking.  You develop chills.  You develop confusion.  You develop chest pain.  You develop shortness of breath.  MAKE SURE YOU:  Understand these instructions.  Will watch your condition.  Will get help right away if you are not doing well or get worse. Document Released: 10/01/2001 Document Revised: 09/11/2012 Document Reviewed: 07/05/2012 Exit[employer] Patient Information  2015 [employer]. This information is not intended to replace advice given to you by your health care provider. Make sure you discuss any questions you have with your health care provider.

## 2013-11-04 NOTE — ED Notes (Signed)
Pt states he has a hx of pancreatitis and that is what his pain feels like. Has has left UQ pain and back pain. Nausea and vomiting.

## 2013-11-04 NOTE — ED Notes (Signed)
Pt w/lunch tray, husband at bedside assisting patient with eating.  Pt reports pain still 5/10

## 2013-11-05 NOTE — ED Provider Notes (Signed)
Medical screening examination/treatment/procedure(s) were conducted as a shared visit with non-physician practitioner(s) and myself.  I personally evaluated the patient during the encounter.   EKG Interpretation None      Recurrent abdominal pain. Improvement in symptoms in ER. Dc home with pcp follow up. Return to ER for new or worsening symptoms. Nontoxic appearing  Hoy Morn, MD 11/05/13 Joen Laura

## 2013-11-12 ENCOUNTER — Other Ambulatory Visit: Payer: Self-pay | Admitting: Internal Medicine

## 2013-11-14 ENCOUNTER — Other Ambulatory Visit: Payer: Self-pay | Admitting: *Deleted

## 2013-11-14 DIAGNOSIS — E1165 Type 2 diabetes mellitus with hyperglycemia: Secondary | ICD-10-CM

## 2013-11-14 DIAGNOSIS — IMO0002 Reserved for concepts with insufficient information to code with codable children: Secondary | ICD-10-CM

## 2013-11-14 MED ORDER — GLUCOSE BLOOD VI STRP: ORAL_STRIP | Status: DC

## 2013-11-21 ENCOUNTER — Encounter: Payer: Medicaid Other | Admitting: Internal Medicine

## 2013-11-21 ENCOUNTER — Encounter: Payer: Self-pay | Admitting: Internal Medicine

## 2013-12-04 ENCOUNTER — Emergency Department (HOSPITAL_COMMUNITY): Payer: Medicaid Other

## 2013-12-04 ENCOUNTER — Encounter (HOSPITAL_COMMUNITY): Payer: Self-pay

## 2013-12-04 ENCOUNTER — Emergency Department (HOSPITAL_COMMUNITY)
Admission: EM | Admit: 2013-12-04 | Discharge: 2013-12-04 | Disposition: A | Discharge status: Home / Self Care | Payer: Medicaid Other | Attending: Emergency Medicine | Admitting: Emergency Medicine

## 2013-12-04 DIAGNOSIS — Z720 Tobacco use: Secondary | ICD-10-CM | POA: Insufficient documentation

## 2013-12-04 DIAGNOSIS — R112 Nausea with vomiting, unspecified: Secondary | ICD-10-CM | POA: Insufficient documentation

## 2013-12-04 DIAGNOSIS — Z794 Long term (current) use of insulin: Secondary | ICD-10-CM | POA: Insufficient documentation

## 2013-12-04 DIAGNOSIS — F329 Major depressive disorder, single episode, unspecified: Secondary | ICD-10-CM | POA: Diagnosis not present

## 2013-12-04 DIAGNOSIS — Z7982 Long term (current) use of aspirin: Secondary | ICD-10-CM | POA: Diagnosis not present

## 2013-12-04 DIAGNOSIS — K219 Gastro-esophageal reflux disease without esophagitis: Secondary | ICD-10-CM | POA: Insufficient documentation

## 2013-12-04 DIAGNOSIS — Z9889 Other specified postprocedural states: Secondary | ICD-10-CM | POA: Insufficient documentation

## 2013-12-04 DIAGNOSIS — R1011 Right upper quadrant pain: Secondary | ICD-10-CM | POA: Diagnosis not present

## 2013-12-04 DIAGNOSIS — R1013 Epigastric pain: Secondary | ICD-10-CM | POA: Diagnosis present

## 2013-12-04 DIAGNOSIS — Z8619 Personal history of other infectious and parasitic diseases: Secondary | ICD-10-CM | POA: Insufficient documentation

## 2013-12-04 DIAGNOSIS — I10 Essential (primary) hypertension: Secondary | ICD-10-CM | POA: Insufficient documentation

## 2013-12-04 DIAGNOSIS — E119 Type 2 diabetes mellitus without complications: Secondary | ICD-10-CM | POA: Diagnosis not present

## 2013-12-04 DIAGNOSIS — G8929 Other chronic pain: Secondary | ICD-10-CM | POA: Diagnosis not present

## 2013-12-04 DIAGNOSIS — K861 Other chronic pancreatitis: Secondary | ICD-10-CM | POA: Insufficient documentation

## 2013-12-04 DIAGNOSIS — R17 Unspecified jaundice: Secondary | ICD-10-CM | POA: Insufficient documentation

## 2013-12-04 DIAGNOSIS — R109 Unspecified abdominal pain: Secondary | ICD-10-CM

## 2013-12-04 DIAGNOSIS — Z8669 Personal history of other diseases of the nervous system and sense organs: Secondary | ICD-10-CM | POA: Diagnosis not present

## 2013-12-04 LAB — CBG MONITORING, ED
GLUCOSE-CAPILLARY: 239 mg/dL — AB (ref 70–99)
Glucose-Capillary: 195 mg/dL — ABNORMAL HIGH (ref 70–99)

## 2013-12-04 LAB — CBC WITH DIFFERENTIAL/PLATELET
Basophils Absolute: 0 10*3/uL (ref 0.0–0.1)
Basophils Relative: 0 % (ref 0–1)
Eosinophils Absolute: 0.2 10*3/uL (ref 0.0–0.7)
Eosinophils Relative: 3 % (ref 0–5)
HCT: 46.5 % (ref 39.0–52.0)
Hemoglobin: 16.7 g/dL (ref 13.0–17.0)
LYMPHS ABS: 2.1 10*3/uL (ref 0.7–4.0)
LYMPHS PCT: 24 % (ref 12–46)
MCH: 33.1 pg (ref 26.0–34.0)
MCHC: 35.9 g/dL (ref 30.0–36.0)
MCV: 92.3 fL (ref 78.0–100.0)
MONO ABS: 0.6 10*3/uL (ref 0.1–1.0)
Monocytes Relative: 7 % (ref 3–12)
NEUTROS ABS: 5.9 10*3/uL (ref 1.7–7.7)
Neutrophils Relative %: 66 % (ref 43–77)
Platelets: 160 10*3/uL (ref 150–400)
RBC: 5.04 MIL/uL (ref 4.22–5.81)
RDW: 13 % (ref 11.5–15.5)
WBC: 8.9 10*3/uL (ref 4.0–10.5)

## 2013-12-04 LAB — COMPREHENSIVE METABOLIC PANEL
ALT: 21 U/L (ref 0–53)
ANION GAP: 17 — AB (ref 5–15)
AST: 15 U/L (ref 0–37)
Albumin: 3.6 g/dL (ref 3.5–5.2)
Alkaline Phosphatase: 132 U/L — ABNORMAL HIGH (ref 39–117)
BILIRUBIN TOTAL: 0.4 mg/dL (ref 0.3–1.2)
BUN: 14 mg/dL (ref 6–23)
CHLORIDE: 99 meq/L (ref 96–112)
CO2: 22 meq/L (ref 19–32)
CREATININE: 0.74 mg/dL (ref 0.50–1.35)
Calcium: 9.7 mg/dL (ref 8.4–10.5)
GLUCOSE: 328 mg/dL — AB (ref 70–99)
Potassium: 4 mEq/L (ref 3.7–5.3)
Sodium: 138 mEq/L (ref 137–147)
Total Protein: 7 g/dL (ref 6.0–8.3)

## 2013-12-04 LAB — RAPID URINE DRUG SCREEN, HOSP PERFORMED
AMPHETAMINES: NOT DETECTED
Barbiturates: NOT DETECTED
Benzodiazepines: NOT DETECTED
Cocaine: NOT DETECTED
OPIATES: NOT DETECTED
TETRAHYDROCANNABINOL: NOT DETECTED

## 2013-12-04 LAB — URINALYSIS, ROUTINE W REFLEX MICROSCOPIC
Bilirubin Urine: NEGATIVE
Glucose, UA: >1000 mg/dL — AB
Hgb urine dipstick: NEGATIVE
Ketones, ur: 15 mg/dL — AB
Leukocytes, UA: NEGATIVE
Nitrite: NEGATIVE
Protein, ur: NEGATIVE mg/dL
Specific Gravity, Urine: 1.033 — ABNORMAL HIGH (ref 1.005–1.030)
Urobilinogen, UA: 0.2 mg/dL (ref 0.0–1.0)
pH: 6 (ref 5.0–8.0)

## 2013-12-04 LAB — I-STAT CHEM 8, ED
BUN: 15 mg/dL (ref 6–23)
CHLORIDE: 104 meq/L (ref 96–112)
CREATININE: 0.7 mg/dL (ref 0.50–1.35)
Calcium, Ion: 1.12 mmol/L (ref 1.12–1.23)
GLUCOSE: 211 mg/dL — AB (ref 70–99)
HCT: 50 % (ref 39.0–52.0)
HEMOGLOBIN: 17 g/dL (ref 13.0–17.0)
POTASSIUM: 3.9 meq/L (ref 3.7–5.3)
SODIUM: 139 meq/L (ref 137–147)
TCO2: 23 mmol/L (ref 0–100)

## 2013-12-04 LAB — URINE MICROSCOPIC-ADD ON

## 2013-12-04 LAB — I-STAT TROPONIN, ED
TROPONIN I, POC: 0.01 ng/mL (ref 0.00–0.08)
Troponin i, poc: 0.01 ng/mL (ref 0.00–0.08)

## 2013-12-04 LAB — ETHANOL: Alcohol, Ethyl (B): 92 mg/dL — ABNORMAL HIGH (ref 0–11)

## 2013-12-04 LAB — LIPASE, BLOOD: LIPASE: 57 U/L (ref 11–59)

## 2013-12-04 MED ORDER — IBUPROFEN 800 MG PO TABS
800.0000 mg | ORAL_TABLET | Freq: Once | ORAL | Status: AC
  Administered 2013-12-04: 800 mg via ORAL
  Filled 2013-12-04: qty 1

## 2013-12-04 MED ORDER — PANTOPRAZOLE SODIUM 40 MG PO TBEC
40.0000 mg | DELAYED_RELEASE_TABLET | Freq: Once | ORAL | Status: AC
  Administered 2013-12-04: 40 mg via ORAL
  Filled 2013-12-04: qty 1

## 2013-12-04 MED ORDER — SODIUM CHLORIDE 0.9 % IV BOLUS (SEPSIS)
1000.0000 mL | Freq: Once | INTRAVENOUS | Status: AC
  Administered 2013-12-04: 1000 mL via INTRAVENOUS

## 2013-12-04 NOTE — ED Notes (Signed)
Pt continues to call out stating he has not seen anybody since he has been here, pt assured that the MD is aware of case. Offered ibuprofen.

## 2013-12-04 NOTE — ED Provider Notes (Signed)
CSN: [id number]     Arrival date & time 12/04/13  1541 History   First MD Initiated Contact with Patient 12/04/13 1724     Chief Complaint  Patient presents with  . Abdominal Pain     (Consider location/radiation/quality/duration/timing/severity/associated sxs/prior Treatment) The history is provided by the patient. No language interpreter was used.  Michael [name] is a 58 y/o M with PMHx of chronic pancreatitis with first episode in 2009-2010, gastritis, HTN, pancreatic lesion, migraines, depression presenting to the ED with abdominal pain that started 3-4 days ago. Patient reported that the pain is localized to the epigastric region and RUQ described as a constant pain without radiation - worse after eating meals. Patient reported that he has been having nausea and emesis - reported approximately 2-3 episodes of emesis - reported stomach contents, denied NB/NB. Reported that he was in a pain clinic and discontinued in May 2015 - reported that he is due to follow up with a pain management clinic, but has yet to so do. Reported that at night his stomach swells. Patient drinks alcohol frequently - stated that he has not drank alcohol since his birthday in [birth year]. Denied fever, chills, chest pain, shortness of breath, difficulty breathing, fall, injury, cough, melena, hematochezia, mucus in stool, hematuria, headache, dizziness. PCP Dr. Algis Liming  Past Medical History  Diagnosis Date  . Chronic pancreatitis   . Smoker   . Gastritis due to alcohol without hemorrhage   . HTN (hypertension)   . Pancreatic lesion     appears innocent and related to chronic pancreatitis  . Heavy alcohol use     hx of  . Marijuana use     hx of  . Migraine   . Depression   . Helicobacter pylori gastritis 03/27/2011    on endoscopy, treated with omeprazole, clarithromycin and amoxicillin  . Insomnia   . ACE inhibitor-aggravated angioedema   . Sessile colonic polyp   . Diabetes mellitus   . GERD (gastroesophageal  reflux disease)   . Chronic abdominal pain on narcotics 06/27/2012   Past Surgical History  Procedure Laterality Date  . Esophagogastroduodenoscopy  03/22/2011    Procedure: ESOPHAGOGASTRODUODENOSCOPY (EGD);  Surgeon: Gatha Mayer, MD;  Location: [facility];  Service: Endoscopy;  Laterality: N/A;  egd with balloon   . Balloon dilation  03/22/2011    Procedure: BALLOON DILATION;  Surgeon: Gatha Mayer, MD;  Location: [facility];  Service: Endoscopy;  Laterality: N/A;  . Eus  04/20/2011    Procedure: UPPER ENDOSCOPIC ULTRASOUND (EUS) LINEAR;  Surgeon: Milus Banister, MD;  Location: [facility];  Service: Endoscopy;  Laterality: N/A;  . Colonoscopy w/ biopsies and polypectomy  09/12/11  . Colonoscopy N/A 05/22/2012    Procedure: COLONOSCOPY;  Surgeon: Gatha Mayer, MD;  Location: [facility];  Service: Endoscopy;  Laterality: N/A;   Family History  Problem Relation Age of Onset  . Colon cancer Neg Hx   . Stomach cancer Neg Hx   . Anesthesia problems Neg Hx   . Hypotension Neg Hx   . Pseudochol deficiency Neg Hx   . Malignant hyperthermia Neg Hx   . Hypertension Sister   . Diabetes Sister   . Heart disease Sister    History  Substance Use Topics  . Smoking status: Current Every Day Smoker -- 0.20 packs/day for 30 years    Types: Cigarettes  . Smokeless tobacco: Never Used     Comment: 4 -5 cigs/day.Wants to quit.  . Alcohol Use: No  Comment: sobriety since 2/13,was drinking 7-10 beer/daily    Review of Systems  Constitutional: Negative for fever and chills.  Respiratory: Negative for chest tightness and shortness of breath.   Cardiovascular: Negative for chest pain.  Gastrointestinal: Positive for nausea, vomiting and abdominal pain. Negative for diarrhea, constipation, blood in stool and anal bleeding.  Musculoskeletal: Negative for back pain and neck pain.  Neurological: Negative for dizziness and headaches.      Allergies  Ace inhibitors and Losartan  Home  Medications   Prior to Admission medications   Medication Sig Start Date End Date Taking? Authorizing Provider  ARIPiprazole (ABILIFY) 15 MG tablet Take 15 mg by mouth at bedtime. Takes with 5 mg and 2 mg for a 22 mg dose   Yes Historical Provider, MD  ARIPiprazole (ABILIFY) 2 MG tablet Take 2 mg by mouth at bedtime. Takes with 15 mg and 5 mg for a 22 mg dose   Yes Historical Provider, MD  ARIPiprazole (ABILIFY) 5 MG tablet Take 5 mg by mouth at bedtime. Takes with 15 mg and 2 mg for a 22 mg dose   Yes Historical Provider, MD  aspirin EC 81 MG tablet Take 81 mg by mouth at bedtime.   Yes Historical Provider, MD  FLUoxetine (PROZAC) 20 MG capsule Take 20 mg by mouth 2 (two) times daily.    Yes Historical Provider, MD  glipiZIDE (GLUCOTROL) 10 MG tablet Take 10 mg by mouth 2 (two) times daily before a meal. 07/22/12  Yes Christen Bame, MD  hydrochlorothiazide (HYDRODIURIL) 25 MG tablet Take 1 tablet (25 mg total) by mouth daily. 08/22/13  Yes Christen Bame, MD  hydrOXYzine (ATARAX/VISTARIL) 10 MG tablet Take 10 mg by mouth daily as needed for itching.    Yes Historical Provider, MD  insulin aspart (NOVOLOG) 100 UNIT/ML injection Inject 20 Units into the skin 2 (two) times daily.   Yes Historical Provider, MD  Insulin Glargine (LANTUS SOLOSTAR) 100 UNIT/ML Solostar Pen Inject 0.65 mLs (65 Units total) into the skin at bedtime 08/28/13  Yes Christen Bame, MD  lovastatin (MEVACOR) 40 MG tablet Take 1 tablet (40 mg total) by mouth at bedtime. 02/07/13  Yes Christen Bame, MD  metFORMIN (GLUCOPHAGE) 500 MG tablet Take 1,000 mg by mouth 2 (two) times daily with a meal.   Yes Historical Provider, MD  Pancrelipase, Lip-Prot-Amyl, (CREON) 36000 UNITS CPEP Take 36,000 Units by mouth 3 (three) times daily.   Yes Historical Provider, MD  pantoprazole (PROTONIX) 40 MG tablet Take 1 tablet (40 mg total) by mouth daily. 02/07/13  Yes Christen Bame, MD  promethazine (PHENERGAN) 25 MG tablet Take 1 tablet (25 mg total) by mouth every 6  (six) hours as needed for nausea. 08/22/13  Yes Christen Bame, MD  Vitamin D, Ergocalciferol, (DRISDOL) 50000 UNITS CAPS capsule Take 50,000 Units by mouth every 7 (seven) days. Every Sunday   Yes Historical Provider, MD  ACCU-CHEK AVIVA PLUS test strip check blood sugar THREE TIMES DAILY BEFORE MEALS AND AT BEDTIME 10/23/13   Christen Bame, MD  ACCU-CHEK AVIVA PLUS test strip CHECK BLOOD SUGAR THREE TIMES DAILY 11/14/13   Christen Bame, MD  ACCU-CHEK FASTCLIX LANCETS MISC 1 each by Does not apply route 3 (three) times daily before meals. Dx code 250.00 insulin requiring 10/07/12   Jonah Blue, DO  Blood Glucose Monitoring Suppl (ACCU-CHEK NANO SMARTVIEW) W/DEVICE KIT 1 each by Does not apply route 3 (three) times daily. Dx code 250.00 insulin requiring 10/07/12   Jonah Blue,  DO  glucose blood (ACCU-CHEK SMARTVIEW) test strip Check blood sugar 3 times a day before meals and bedtime Dx code E11.65 insulin requiring 11/14/13   Clinton Gallant, MD  glucose blood (AGAMATRIX PRESTO TEST) test strip Use as instructed 12/06/12   Madilyn Fireman, MD  Insulin Pen Needle (B-D UF III MINI PEN NEEDLES) 31G X 5 MM MISC Use to inject insulin 3 times a day dx code 250.00 10/22/12   Clinton Gallant, MD  NOVOLOG FLEXPEN 100 UNIT/ML FlexPen INJECT TEN UNITS into SKIN EVERY DAY Patient not taking: Reported on 12/04/2013 11/14/13   Clinton Gallant, MD   BP 125/84 mmHg  Pulse 81  Temp(Src) 98.3 F (36.8 C) (Oral)  Resp 18  SpO2 100% Physical Exam  Constitutional: He is oriented to person, place, and time. He appears well-developed and well-nourished. No distress.  HENT:  Head: Normocephalic and atraumatic.  Mouth/Throat: Oropharynx is clear and moist. No oropharyngeal exudate.  Eyes: Conjunctivae and EOM are normal. Pupils are equal, round, and reactive to light. Right eye exhibits no discharge. Left eye exhibits no discharge. Scleral icterus (Bilateral ) is present.  Neck: Normal range of motion. Neck supple. No tracheal deviation  present.  Cardiovascular: Normal rate, regular rhythm and normal heart sounds.   Pulmonary/Chest: Effort normal and breath sounds normal. No respiratory distress. He has no wheezes. He has no rales. He exhibits no tenderness.  Abdominal: Soft. Normal appearance and bowel sounds are normal. He exhibits no distension. There is tenderness in the right upper quadrant and epigastric area. There is no rebound and no guarding.  Negative abdominal distension  BS normoactive in all 4 quadrants Abdomen soft upon palpation  Discomfort upon palpation to the RUQ and epigastric region  Negative peritoneal signs Negative rovsings  Negative Murphy's  Musculoskeletal: Normal range of motion.  Lymphadenopathy:    He has no cervical adenopathy.  Neurological: He is alert and oriented to person, place, and time. No cranial nerve deficit. He exhibits normal muscle tone. Coordination normal.  Skin: Skin is warm. No rash noted. He is not diaphoretic. No erythema.  Psychiatric: He has a normal mood and affect. His behavior is normal. Thought content normal.  Nursing note and vitals reviewed.   ED Course  Procedures (including critical care time)   *RADIOLOGY REPORT*  Clinical Data: Follow-up pancreatic tail lesion.  MRI ABDOMEN WITHOUT AND WITH CONTRAST (MRCP)  Technique: Multiplanar multisequence MR imaging of the abdomen was performed without and with contrast, including heavily T2-weighted images of the biliary and pancreatic ducts. Three-dimensional MR images were rendered by post processing of the original MR data.  Contrast: 79mL MULTIHANCE GADOBENATE DIMEGLUMINE 529 MG/ML IV SOLN  Comparison: MRI 05/23/2011 and 11/13/2010.  Findings: Stable cystic type dilatation of the pancreatic duct in the tail region. It has a somewhat beaded appearance and is most likely related to previous inflammation/pancreatitis. No worrisome enhancement. No discrete obstructing pancreatic mass. The  head and body regions appear normal and stable. The common bile duct and main pancreatic ducts are normal in caliber and a normal course.  The liver and spleen are unremarkable and stable. There is a small stable lymph node in the splenic hilum measuring 10.5 mm. Small bilateral adrenal gland adenomas are noted. The kidneys are normal and stable.  The aorta is normal in caliber. The major branch vessels are normal. No mesenteric or retroperitoneal adenopathy. Small scattered nodes are noted. The bony structures are unremarkable.  IMPRESSION:  1. Stable cystic dilatation of the pancreatic duct  in the tail region. No pancreatic mass is identified and this is likely the sequela of remote pancreatitis. Small side branch papillary mucinous neoplasms are possible. Recommend follow-up MR examination in 6 months. This would be a 1-year follow-up from the original CT scan. 2. Stable small bilateral adrenal gland adenomas.  Original Report Authenticated By: P. Kalman Jewels, M.D.     Results for orders placed or performed during the hospital encounter of 12/04/13  CBC with Differential  Result Value Ref Range   WBC 8.9 4.0 - 10.5 K/uL   RBC 5.04 4.22 - 5.81 MIL/uL   Hemoglobin 16.7 13.0 - 17.0 g/dL   HCT 46.5 39.0 - 52.0 %   MCV 92.3 78.0 - 100.0 fL   MCH 33.1 26.0 - 34.0 pg   MCHC 35.9 30.0 - 36.0 g/dL   RDW 13.0 11.5 - 15.5 %   Platelets 160 150 - 400 K/uL   Neutrophils Relative % 66 43 - 77 %   Neutro Abs 5.9 1.7 - 7.7 K/uL   Lymphocytes Relative 24 12 - 46 %   Lymphs Abs 2.1 0.7 - 4.0 K/uL   Monocytes Relative 7 3 - 12 %   Monocytes Absolute 0.6 0.1 - 1.0 K/uL   Eosinophils Relative 3 0 - 5 %   Eosinophils Absolute 0.2 0.0 - 0.7 K/uL   Basophils Relative 0 0 - 1 %   Basophils Absolute 0.0 0.0 - 0.1 K/uL  Comprehensive metabolic panel  Result Value Ref Range   Sodium 138 137 - 147 mEq/L   Potassium 4.0 3.7 - 5.3 mEq/L   Chloride 99 96 - 112 mEq/L    CO2 22 19 - 32 mEq/L   Glucose, Bld 328 (H) 70 - 99 mg/dL   BUN 14 6 - 23 mg/dL   Creatinine, Ser 0.74 0.50 - 1.35 mg/dL   Calcium 9.7 8.4 - 10.5 mg/dL   Total Protein 7.0 6.0 - 8.3 g/dL   Albumin 3.6 3.5 - 5.2 g/dL   AST 15 0 - 37 U/L   ALT 21 0 - 53 U/L   Alkaline Phosphatase 132 (H) 39 - 117 U/L   Total Bilirubin 0.4 0.3 - 1.2 mg/dL   GFR calc non Af Amer >90 >90 mL/min   GFR calc Af Amer >90 >90 mL/min   Anion gap 17 (H) 5 - 15  Lipase, blood  Result Value Ref Range   Lipase 57 11 - 59 U/L  Urinalysis, Routine w reflex microscopic  Result Value Ref Range   Color, Urine YELLOW YELLOW   APPearance CLEAR CLEAR   Specific Gravity, Urine 1.033 (H) 1.005 - 1.030   pH 6.0 5.0 - 8.0   Glucose, UA >1000 (A) NEGATIVE mg/dL   Hgb urine dipstick NEGATIVE NEGATIVE   Bilirubin Urine NEGATIVE NEGATIVE   Ketones, ur 15 (A) NEGATIVE mg/dL   Protein, ur NEGATIVE NEGATIVE mg/dL   Urobilinogen, UA 0.2 0.0 - 1.0 mg/dL   Nitrite NEGATIVE NEGATIVE   Leukocytes, UA NEGATIVE NEGATIVE  Urine rapid drug screen (hosp performed)  Result Value Ref Range   Opiates NONE DETECTED NONE DETECTED   Cocaine NONE DETECTED NONE DETECTED   Benzodiazepines NONE DETECTED NONE DETECTED   Amphetamines NONE DETECTED NONE DETECTED   Tetrahydrocannabinol NONE DETECTED NONE DETECTED   Barbiturates NONE DETECTED NONE DETECTED  Ethanol  Result Value Ref Range   Alcohol, Ethyl (B) 92 (H) 0 - 11 mg/dL  Urine microscopic-add on  Result Value Ref Range   Squamous  Epithelial / LPF RARE RARE   WBC, UA 0-2 <3 WBC/hpf   RBC / HPF 0-2 <3 RBC/hpf   Bacteria, UA RARE RARE  I-stat troponin, ED (only if pt is 56 y.o. or older & pain is above umbilicus) - do not order at [facility]  Result Value Ref Range   Troponin i, poc 0.01 0.00 - 0.08 ng/mL   Comment 3          CBG monitoring, ED  Result Value Ref Range   Glucose-Capillary 239 (H) 70 - 99 mg/dL  I-stat troponin, ED  Result Value Ref Range   Troponin i, poc 0.01 0.00 -  0.08 ng/mL   Comment 3          CBG monitoring, ED  Result Value Ref Range   Glucose-Capillary 195 (H) 70 - 99 mg/dL  I-stat chem 8, ed  Result Value Ref Range   Sodium 139 137 - 147 mEq/L   Potassium 3.9 3.7 - 5.3 mEq/L   Chloride 104 96 - 112 mEq/L   BUN 15 6 - 23 mg/dL   Creatinine, Ser 0.70 0.50 - 1.35 mg/dL   Glucose, Bld 211 (H) 70 - 99 mg/dL   Calcium, Ion 1.12 1.12 - 1.23 mmol/L   TCO2 23 0 - 100 mmol/L   Hemoglobin 17.0 13.0 - 17.0 g/dL   HCT 50.0 39.0 - 52.0 %    Labs Review Labs Reviewed  COMPREHENSIVE METABOLIC PANEL - Abnormal; Notable for the following:    Glucose, Bld 328 (*)    Alkaline Phosphatase 132 (*)    Anion gap 17 (*)    All other components within normal limits  URINALYSIS, ROUTINE W REFLEX MICROSCOPIC - Abnormal; Notable for the following:    Specific Gravity, Urine 1.033 (*)    Glucose, UA >1000 (*)    Ketones, ur 15 (*)    All other components within normal limits  ETHANOL - Abnormal; Notable for the following:    Alcohol, Ethyl (B) 92 (*)    All other components within normal limits  CBG MONITORING, ED - Abnormal; Notable for the following:    Glucose-Capillary 239 (*)    All other components within normal limits  CBG MONITORING, ED - Abnormal; Notable for the following:    Glucose-Capillary 195 (*)    All other components within normal limits  I-STAT CHEM 8, ED - Abnormal; Notable for the following:    Glucose, Bld 211 (*)    All other components within normal limits  CBC WITH DIFFERENTIAL  LIPASE, BLOOD  URINE RAPID DRUG SCREEN (HOSP PERFORMED)  URINE MICROSCOPIC-ADD ON  Randolm Idol, ED  Randolm Idol, ED    Imaging Review US Abdomen Limited Ruq  12/04/2013   CLINICAL DATA:  Generalized abdominal pain.  EXAM: US ABDOMEN LIMITED - RIGHT UPPER QUADRANT  COMPARISON:  MRI abdomen 06/16/2011 and CT 11/13/2010  FINDINGS: Gallbladder:  Gallbladder is mildly distended although there is no evidence of gallstones or sludge. No  gallbladder wall thickening chief no pericholecystic fluid. Negative sonographic Murphy sign.  Common bile duct:  Diameter: 6.9 mm.  Liver:  No focal lesion identified. Within normal limits in parenchymal echogenicity.  IMPRESSION: Mild gallbladder dilatation without acute hepatobiliary disease.   Electronically Signed   By: Marin Olp M.D.   On: 12/04/2013 19:01     EKG Interpretation   Date/Time:  Thursday December 04 2013 15:53:01 EST Ventricular Rate:  98 PR Interval:  124 QRS Duration: 80 QT Interval:  348  QTC Calculation: 444 R Axis:   41 Text Interpretation:  Normal sinus rhythm Nonspecific T wave abnormality  Abnormal ECG No significant change since last tracing Confirmed by Debby Freiberg [phone]) on 12/04/2013 8:20:42 PM      9:57 PM This provider had a long discussion with the patient regarding labs and imaging. Patient reported that he drank a 24 ounce of beer to numb the pain today. Reported that he has an appointment with PCP and GI on December 26, 2013. Discussed with patient importance of keeping these appointments. Discussed with patient that this appears to be a chronic issue and instructed patient to get back into pain management - patient understood and agreed. Patient agreed to plan of care for discharge.   MDM   Final diagnoses:  Abdominal pain  Chronic abdominal pain    Medications  sodium chloride 0.9 % bolus 1,000 mL (0 mLs Intravenous Stopped 12/04/13 2003)  pantoprazole (PROTONIX) EC tablet 40 mg (40 mg Oral Given 12/04/13 2153)  ibuprofen (ADVIL,MOTRIN) tablet 800 mg (800 mg Oral Given 12/04/13 2153)    Filed Vitals:   12/04/13 2130 12/04/13 2155 12/04/13 2200 12/04/13 2230  BP:  142/86 138/70 125/84  Pulse: 80 87 82 81  Temp:    98.3 F (36.8 C)  TempSrc:    Oral  Resp: 15 16 15 18   SpO2: 99% 98% 99% 100%   This provider reviewed the patient's chart. Patient has history of chronic alcoholic pancreatitis and is on Creon. Patient has been seen  in the ED setting regarding abdominal pain numerous times - patient recently seen on 11/04/2013 for abdominal pain with similar presentation. Imaging has not been performed since 05/2011.  EKG normal sinus rhythm with a heart rate of 98 bpm-nonspecific T-wave abnormality noted-no cystic in change since last tracing. I-STAT troponin negative elevation. Second troponin negative elevation. CBC negative elevated white blood cell count. Hemoglobin 16.7, hematocrit 46.5. CMP unremarkable. Mildly elevated glucose of 328, mild bump in anion gap of 17.0 mEq per liter. AST 15, ALT 21, alkaline phosphatase 132. Negative elevated lipase noted. Ethanol level elevated 92. Urine drug screen negative. Urinalysis negative for infection-negative elevated triglycerides with leukocytes identified. Right upper quadrant ultrasound identified mild gallbladder distention without acute hepatobiliary disease-no evidence of gallstones or sludge. No gallbladder wall thickening or pericholecystic fluid identified-negative Murphy sign. Repeat Chem-8 noted glucose of 211, anion gap decreased to 12.0 mEq/L.  Doubt cholecystitis/cholangitis. Doubt acute pancreatitis. Doubt appendicitis. Negative findings of DKA-negative abnormalities noted to bicarbonate, glucose decreased from 328 to 239 after fluids given. Alcohol level mildly elevated, patient is alert and oriented and coherent. Patient tolerated fluids PO without difficulty. This appears to be a chronic issue. Patient has history of alcohol induce pancreatitis and chronic abdominal pain - patient seen and assessed numerous times in the ED setting. Patient used to be in pain management, but not longer going. Patient stable, afebrile. Patient not septic appearing. Discharged patient. Referred to PCP and GI - discussed with patient to keep appointments. Discussed with patient to avoid any alcohol - discussed consequences. Discussed with patient to closely monitor symptoms and if symptoms are to  worsen or change to report back to the ED - strict return instructions given.  Patient agreed to plan of care, understood, all questions answered.   Jamse Mead, PA-C 12/04/13 2250  Debby Freiberg, MD 12/05/13 [phone]

## 2013-12-04 NOTE — ED Notes (Signed)
PA-C at bedside 

## 2013-12-04 NOTE — ED Notes (Signed)
Per [facility], pt c/o right sided abd pain for the past 3 days. No N/V/D. Pt has hx of pancreatitis and told EMS it felt the same. Reported drinking 1 can of beer today.

## 2013-12-04 NOTE — ED Notes (Signed)
Pt remains in u/s.

## 2013-12-04 NOTE — ED Notes (Signed)
Pt calling out, requesting to speak to provider. Dr. Colin Rhein notified of patient's concerns.

## 2013-12-04 NOTE — Discharge Instructions (Signed)
Please call your doctor for a followup appointment within 24-48 hours. When you talk to your doctor please let them know that you were seen in the emergency department and have them acquire all of your records so that they can discuss the findings with you and formulate a treatment plan to fully care for your new and ongoing problems. Please call and set-up an appointment with your primary care provider Please avoid any physical or strenuous activity  Please avoid foods high in fat, grease, spice, acid Please continue to take at home medications as prescribed Please continue to monitor glucose Please continue to monitor symptoms closely and if symptoms are to worsen or change (fever greater than 101, chills, sweating, nausea, vomiting, chest pain, shortness of breathe, difficulty breathing, weakness, numbness, tingling, worsening or changes to pain pattern, blood in the stools, black tarry stools, inability to keep any food or fluids down) please report back to the Emergency Department immediately.    Abdominal Pain Many things can cause abdominal pain. Usually, abdominal pain is not caused by a disease and will improve without treatment. It can often be observed and treated at home. Your health care provider will do a physical exam and possibly order blood tests and X-rays to help determine the seriousness of your pain. However, in many cases, more time must pass before a clear cause of the pain can be found. Before that point, your health care provider may not know if you need more testing or further treatment. HOME CARE INSTRUCTIONS  Monitor your abdominal pain for any changes. The following actions may help to alleviate any discomfort you are experiencing:  Only take over-the-counter or prescription medicines as directed by your health care provider.  Do not take laxatives unless directed to do so by your health care provider.  Try a clear liquid diet (broth, tea, or water) as directed by your  health care provider. Slowly move to a bland diet as tolerated. SEEK MEDICAL CARE IF:  You have unexplained abdominal pain.  You have abdominal pain associated with nausea or diarrhea.  You have pain when you urinate or have a bowel movement.  You experience abdominal pain that wakes you in the night.  You have abdominal pain that is worsened or improved by eating food.  You have abdominal pain that is worsened with eating fatty foods.  You have a fever. SEEK IMMEDIATE MEDICAL CARE IF:   Your pain does not go away within 2 hours.  You keep throwing up (vomiting).  Your pain is felt only in portions of the abdomen, such as the right side or the left lower portion of the abdomen.  You pass bloody or black tarry stools. MAKE SURE YOU:  Understand these instructions.   Will watch your condition.   Will get help right away if you are not doing well or get worse.  Document Released: 10/19/2004 Document Revised: 01/14/2013 Document Reviewed: 09/18/2012 [facility] Patient Information 2015 [place], [place]. This information is not intended to replace advice given to you by your health care provider. Make sure you discuss any questions you have with your health care provider.   Emergency Department Resource Guide 1) Find a Doctor and Pay Out of Pocket Although you won't have to find out who is covered by your insurance plan, it is a good idea to ask around and get recommendations. You will then need to call the office and see if the doctor you have chosen will accept you as a new patient and  what types of options they offer for patients who are self-pay. Some doctors offer discounts or will set up payment plans for their patients who do not have insurance, but you will need to ask so you aren't surprised when you get to your appointment.  2) Contact Your Local Health Department Not all health departments have doctors that can see patients for sick visits, but many do, so it is worth a  call to see if yours does. If you don't know where your local health department is, you can check in your phone book. The CDC also has a tool to help you locate your state's health department, and many state websites also have listings of all of their local health departments.  3) Find a [facility] If your illness is not likely to be very severe or complicated, you may want to try a walk in clinic. These are popping up all over the country in pharmacies, drugstores, and shopping centers. They're usually staffed by nurse practitioners or physician assistants that have been trained to treat common illnesses and complaints. They're usually fairly quick and inexpensive. However, if you have serious medical issues or chronic medical problems, these are probably not your best option.  No Primary Care Doctor: - Call Health Connect at  [phone] - they can help you locate a primary care doctor that  accepts your insurance, provides certain services, etc. - Physician Referral Service- [phone]  Chronic Pain Problems: Organization         Address  Phone   Notes  [facility]  [phone] Patients need to be referred by their primary care doctor.   Medication Assistance: Organization         Address  Phone   Notes  [facility] Medication [facility] [address] [place], [place] [address] [phone] --Must be a resident of [facility] -- Must have NO insurance coverage whatsoever (no Medicaid/ Medicare, etc.) -- The pt. MUST have a primary care doctor that directs their care regularly and follows them in the community   MedAssist  [phone]   [employer]  [phone]    Agencies that provide inexpensive medical care: Organization         Address  Phone   Notes  [place]  [phone]   [facility]    [phone]   [facility] [place], [place] [address]  [phone]   [place] [address] [place] [phone]   Planned Parenthood    [phone]   [facility]    [phone]   [place] and [place] [address], [place] Phone:  [phone], Fax:  [phone] Hours of Operation:  9 am - 6 pm, M-F.  Also accepts Medicaid/Medicare and self-pay.  [facility] for [place], [address], [place] Phone: [phone], Fax: [phone]. Hours of Operation:  8:30 am - 5:30 pm, M-F.  Also accepts Medicaid and self-pay.  [facility] [place] [address], [place] Phone: [phone]   [place], [place], [place] [phone], Ext. [phone] Mondays & Thursdays: 7-9 AM.  First 15 patients are seen on a first come, first serve basis.    [place] Providers:  Patent attorney  Notes  [facility] [address], [place] [phone] Also accepts self-pay patients.  [facility] [address], [place]  [phone]   [place], [address], [place] [phone]   [facility] [address], [place] [phone]   Lucianne Lei [address], [place]   [phone] Only accepts [place] Access [place] patients after they have their name applied to their card.   Self-Pay (no insurance) in [facility]:  Organization         Address  Phone   Notes  Sickle Cell Patients, [facility] Internal Medicine [place] [phone]   [facility] [place] [phone]   [facility] [place]  [place], [place], [place] [phone]   [facility]/Dr. Osei-Bonsu  [address] [place] or [address],  [place] [phone] Phone number for both [place] and [place] locations is the same.  [facility] [address] [place] [phone]   [facility] [address], [place] or [address] [phone]   [facility] [address] [place] [phone], phone; [phone], fax Sees patients 1st and 3rd Saturday of every month.  Must not qualify for public or private insurance (i.e. Medicaid, Medicare, [place] Health Choice, Veterans' Benefits)  Household income should be no more than 200% of the poverty level The clinic cannot treat you if you are pregnant or think you are pregnant  Sexually transmitted diseases are not treated at the clinic.    Dental Care: Organization         Address  Phone  Notes  [facility] Department of [facility] [place] [phone] Accepts children up to age 19 who are enrolled in [place] or [place]; pregnant women with a Medicaid card; and children who have applied for Medicaid or [place] Health Choice, but were declined, whose parents can pay a reduced fee at time of service.  [facility] Department of [facility]  [address], [place] [phone] Accepts children up to age 6 who are enrolled in [place] or [place]; pregnant women with a Medicaid card; and children who have applied for Medicaid or [place] Health Choice, but were declined, whose parents can pay a reduced fee at time of service.  [facility] Adult Dental Access PROGRAM  [place] [phone] Patients are seen by appointment only. Walk-ins are not accepted. [place] will see patients 42 years of age and older. Monday - Tuesday (8am-5pm) Most Wednesdays (8:30-5pm) $30 per visit, cash only  [facility] Adult Dental Access PROGRAM  [address], [facility] [phone] Patients  are seen by appointment only. Walk-ins are not accepted. [place] will see patients 47 years of age and older. One Wednesday Evening (Monthly: Volunteer Based).  $30 per visit, cash only  [place]  [phone] for adults; Children under age 20, call Graduate Pediatric Dentistry at [phone]. Children aged 1-14, please call [phone] to request a pediatric application.  Dental services are provided in all areas of dental care including fillings, crowns and bridges, complete  and partial dentures, implants, gum treatment, root canals, and extractions. Preventive care is also provided. Treatment is provided to both adults and children. Patients are selected via a lottery and there is often a waiting list.   [facility] [address] [place]  [phone] [email or web address]   [facility] [address] [place], [place] [phone], Ext. [phone] Second and Fourth Thursday of each month, opens at 6:30 AM; Clinic ends at 9 AM.  Patients are seen on a first-come first-served basis, and a limited number are seen during each clinic.   [facility]  [address] Hillard Danker [place], [place] [phone]   Eligibility Requirements You must have lived in [place], [place], or [place] counties for at least the last three months.   You cannot be eligible for state or federal sponsored [employer], including [employer], [place], or [employer].   You generally cannot be eligible for healthcare insurance through your employer.    How to apply: Eligibility screenings are held every Tuesday and Wednesday afternoon from 1:00 pm until 4:00 pm. You do not need an appointment for the interview!  [facility] [address] [place], [place]   [place]  [place] Department  [place]  [phone]     Behavioral Health Resources in the Community: Intensive Outpatient Programs Organization         Address  Phone  Notes  [place]. [address] [place], [place] [phone]   [facility] Outpatient [address], [place], [place]   ADS: Alcohol & Drug S[facility] [address], [place], [place]   [place] [address]  [place], [place] or [phone]   Substance Abuse Resources Organization         Address  Phone  Notes  Alcohol and Drug Services  [phone]   [place]  [phone]   [facility] [place]   Chinita Pester  [phone]   Residential & Outpatient Substance Abuse Program  [phone]   Psychological Services Organization         Address  Phone  Notes  [facility] [place]  [place]  [phone]   [place] [address], [place] or [phone]    Mobile Crisis Teams Organization         Address  Phone  Notes  [facility], Mobile Crisis Care Unit  [phone]   [facility]  [address] [place], [place]   [place] [address] [place] [phone]    Self-Help/Support Groups Organization         Address  Phone             Notes  [place]. of [place] - variety of support groups  [place] Call for more information  Narcotics Anonymous (NA), [facility] [address], [employer] [place]  2 meetings at this location   Special educational needs teacher         Address  Phone  Notes  [facility]AP Residential Treatment [place],    [place]  [place]  [address] [place] [phone], [place], [place]   [place], [place] [phone] Admissions: 8am-3pm M-F  Incentives  Substance [place] [address],    [place], [place] [phone]   [facility] [address] [place], [place], [place]   [facility] [address]  [place], [place]   [facility] Programs - Intensive Outpatient [address] Kristeen [address], [place], [place]   [facility] [place].,  [place], [place] [phone] or [phone]   [facility] Services (RTS) [address] [place], [place] Accepts Medicaid  F[facility]ship [place] [address],  [place] [phone] Substance Abuse/Addiction Treatment   [facility] Organization         Address  Phone  Notes  [facility]  [phone]   Domenic Schwab, PhD [address] Arlis Porta [place], [place]   [phone] or [phone]   [place], [place] [phone]   [facility] [address], [place], [place] [phone] Insurance/Medicaid/sponsorship through [facility] and Families [address]                                    [place], [place] [phone] [place] [address], [place] [phone]    Dr. Adele Schilder  [phone]   [facility] Dept. 1) [address] [place] 2) [place] 3)  [place] [address], [place] [phone]  [phone]   [place] [phone] or [phone] (After Hours)

## 2013-12-04 NOTE — ED Notes (Signed)
CBG 195 

## 2013-12-04 NOTE — ED Notes (Signed)
Pt given bus pass ?

## 2013-12-04 NOTE — ED Notes (Signed)
To us

## 2013-12-10 ENCOUNTER — Other Ambulatory Visit: Payer: Self-pay | Admitting: *Deleted

## 2013-12-11 MED ORDER — GLUCOSE BLOOD VI STRP: ORAL_STRIP | Status: DC

## 2013-12-11 NOTE — Telephone Encounter (Signed)
Rx called in to pharmacy. 

## 2013-12-15 ENCOUNTER — Other Ambulatory Visit: Payer: Self-pay | Admitting: Internal Medicine

## 2013-12-20 ENCOUNTER — Emergency Department (HOSPITAL_COMMUNITY)
Admission: EM | Admit: 2013-12-20 | Discharge: 2013-12-20 | Disposition: A | Discharge status: Home / Self Care | Payer: Medicaid Other | Attending: Emergency Medicine | Admitting: Emergency Medicine

## 2013-12-20 ENCOUNTER — Encounter (HOSPITAL_COMMUNITY): Payer: Self-pay | Admitting: *Deleted

## 2013-12-20 DIAGNOSIS — Z8669 Personal history of other diseases of the nervous system and sense organs: Secondary | ICD-10-CM | POA: Diagnosis not present

## 2013-12-20 DIAGNOSIS — Z720 Tobacco use: Secondary | ICD-10-CM | POA: Diagnosis not present

## 2013-12-20 DIAGNOSIS — F329 Major depressive disorder, single episode, unspecified: Secondary | ICD-10-CM | POA: Diagnosis not present

## 2013-12-20 DIAGNOSIS — K861 Other chronic pancreatitis: Secondary | ICD-10-CM | POA: Insufficient documentation

## 2013-12-20 DIAGNOSIS — R109 Unspecified abdominal pain: Secondary | ICD-10-CM | POA: Diagnosis not present

## 2013-12-20 DIAGNOSIS — K219 Gastro-esophageal reflux disease without esophagitis: Secondary | ICD-10-CM | POA: Insufficient documentation

## 2013-12-20 DIAGNOSIS — Z86010 Personal history of colonic polyps: Secondary | ICD-10-CM | POA: Insufficient documentation

## 2013-12-20 DIAGNOSIS — Z79899 Other long term (current) drug therapy: Secondary | ICD-10-CM | POA: Insufficient documentation

## 2013-12-20 DIAGNOSIS — E119 Type 2 diabetes mellitus without complications: Secondary | ICD-10-CM | POA: Diagnosis not present

## 2013-12-20 DIAGNOSIS — Z7982 Long term (current) use of aspirin: Secondary | ICD-10-CM | POA: Insufficient documentation

## 2013-12-20 DIAGNOSIS — Z794 Long term (current) use of insulin: Secondary | ICD-10-CM | POA: Insufficient documentation

## 2013-12-20 DIAGNOSIS — I10 Essential (primary) hypertension: Secondary | ICD-10-CM | POA: Diagnosis not present

## 2013-12-20 DIAGNOSIS — R0781 Pleurodynia: Secondary | ICD-10-CM | POA: Diagnosis present

## 2013-12-20 DIAGNOSIS — G8929 Other chronic pain: Secondary | ICD-10-CM

## 2013-12-20 LAB — COMPREHENSIVE METABOLIC PANEL
ALBUMIN: 3.3 g/dL — AB (ref 3.5–5.2)
ALK PHOS: 105 U/L (ref 39–117)
ALT: 22 U/L (ref 0–53)
ANION GAP: 17 — AB (ref 5–15)
AST: 16 U/L (ref 0–37)
BUN: 12 mg/dL (ref 6–23)
CO2: 20 mEq/L (ref 19–32)
CREATININE: 0.88 mg/dL (ref 0.50–1.35)
Calcium: 9 mg/dL (ref 8.4–10.5)
Chloride: 103 mEq/L (ref 96–112)
GFR calc non Af Amer: >90 mL/min (ref >90)
GLUCOSE: 239 mg/dL — AB (ref 70–99)
POTASSIUM: 4 meq/L (ref 3.7–5.3)
Sodium: 140 mEq/L (ref 137–147)
TOTAL PROTEIN: 6.6 g/dL (ref 6.0–8.3)

## 2013-12-20 LAB — CBC WITH DIFFERENTIAL/PLATELET
BASOS PCT: 0 % (ref 0–1)
Basophils Absolute: 0 10*3/uL (ref 0.0–0.1)
EOS ABS: 0.2 10*3/uL (ref 0.0–0.7)
Eosinophils Relative: 3 % (ref 0–5)
HEMATOCRIT: 42.5 % (ref 39.0–52.0)
Hemoglobin: 15 g/dL (ref 13.0–17.0)
LYMPHS ABS: 3.1 10*3/uL (ref 0.7–4.0)
Lymphocytes Relative: 33 % (ref 12–46)
MCH: 32.7 pg (ref 26.0–34.0)
MCHC: 35.3 g/dL (ref 30.0–36.0)
MCV: 92.6 fL (ref 78.0–100.0)
MONO ABS: 0.6 10*3/uL (ref 0.1–1.0)
MONOS PCT: 6 % (ref 3–12)
Neutro Abs: 5.7 10*3/uL (ref 1.7–7.7)
Neutrophils Relative %: 58 % (ref 43–77)
Platelets: 228 10*3/uL (ref 150–400)
RBC: 4.59 MIL/uL (ref 4.22–5.81)
RDW: 14.2 % (ref 11.5–15.5)
WBC: 9.6 10*3/uL (ref 4.0–10.5)

## 2013-12-20 LAB — LIPASE, BLOOD: Lipase: 94 U/L — ABNORMAL HIGH (ref 11–59)

## 2013-12-20 MED ORDER — ACETAMINOPHEN 325 MG PO TABS
650.0000 mg | ORAL_TABLET | Freq: Once | ORAL | Status: AC
  Administered 2013-12-20: 650 mg via ORAL
  Filled 2013-12-20: qty 2

## 2013-12-20 MED ORDER — HYDROMORPHONE HCL 1 MG/ML IJ SOLN
2.0000 mg | Freq: Once | INTRAMUSCULAR | Status: AC
  Administered 2013-12-20: 2 mg via INTRAMUSCULAR
  Filled 2013-12-20: qty 2

## 2013-12-20 NOTE — ED Notes (Signed)
Dr. Docherty at bedside.

## 2013-12-20 NOTE — ED Notes (Signed)
The pt is c/o rt sided rib pain for 6 years.  amb from [facility].  [facility] [place].  He reports that he was seen here las week for the same.  Rapid speech .  Talking to himself in the triage area

## 2013-12-20 NOTE — Discharge Instructions (Signed)

## 2013-12-20 NOTE — ED Provider Notes (Signed)
The patient was pacing in the hallway when I first went to the room. Once in the room, he stated he was having "the same symptoms I always have and if I have to wait for the doctor I might as well leave". I told him I was the physician assistant and was there to take care of him but the patient insisted on seeing the doctor, not the PA. Dr. Tawnya Crook informed.  Dewaine Oats, PA-C 12/20/13 7614  Pamella Pert, MD 12/20/13 0600

## 2013-12-20 NOTE — ED Notes (Signed)
Nehemiah Settle, PA at bedside. Pt refusing to be seen by the PA, stating "I want to see a doctor."

## 2013-12-20 NOTE — ED Provider Notes (Addendum)
CSN: [id number]     Arrival date & time 12/20/13  0206 History  This chart was scribed for Michael Patches, MD by Peyton Bottoms, ED Scribe. This patient was seen in room B15C/B15C and the patient's care was started at 3:31 AM.   Chief Complaint  Patient presents with  . rib pain    Patient is a 58 y.o. male presenting with flank pain. The history is provided by the patient. No language interpreter was used.  Flank Pain This is a chronic problem. The current episode started more than 1 week ago. The problem occurs daily. The problem has not changed since onset.Associated symptoms include abdominal pain. Pertinent negatives include no chest pain, no headaches and no shortness of breath. Nothing aggravates the symptoms. Nothing relieves the symptoms. He has tried nothing for the symptoms.   HPI Comments: Michael [name] is a 58 y.o. male with a history of chronic pancreatitis, who presents to the Emergency Department complaining of right sided rib/flank pain that began a few years ago. He states that the pain is constant pain that he feels daily. He has difficulty sleeping due to pain.  He states pain meds improves his pain. Patient has an upcoming appointment with pain doctor in December. He states he had beer earlier tonight. He denies history of abdominal surgery. Past Medical History  Diagnosis Date  . Chronic pancreatitis   . Smoker   . Gastritis due to alcohol without hemorrhage   . HTN (hypertension)   . Pancreatic lesion     appears innocent and related to chronic pancreatitis  . Heavy alcohol use     hx of  . Marijuana use     hx of  . Migraine   . Depression   . Helicobacter pylori gastritis 03/27/2011    on endoscopy, treated with omeprazole, clarithromycin and amoxicillin  . Insomnia   . ACE inhibitor-aggravated angioedema   . Sessile colonic polyp   . Diabetes mellitus   . GERD (gastroesophageal reflux disease)   . Chronic abdominal pain on narcotics 06/27/2012   Past  Surgical History  Procedure Laterality Date  . Esophagogastroduodenoscopy  03/22/2011    Procedure: ESOPHAGOGASTRODUODENOSCOPY (EGD);  Surgeon: Gatha Mayer, MD;  Location: [facility];  Service: Endoscopy;  Laterality: N/A;  egd with balloon   . Balloon dilation  03/22/2011    Procedure: BALLOON DILATION;  Surgeon: Gatha Mayer, MD;  Location: [facility];  Service: Endoscopy;  Laterality: N/A;  . Eus  04/20/2011    Procedure: UPPER ENDOSCOPIC ULTRASOUND (EUS) LINEAR;  Surgeon: Milus Banister, MD;  Location: [facility];  Service: Endoscopy;  Laterality: N/A;  . Colonoscopy w/ biopsies and polypectomy  09/12/11  . Colonoscopy N/A 05/22/2012    Procedure: COLONOSCOPY;  Surgeon: Gatha Mayer, MD;  Location: [facility];  Service: Endoscopy;  Laterality: N/A;   Family History  Problem Relation Age of Onset  . Colon cancer Neg Hx   . Stomach cancer Neg Hx   . Anesthesia problems Neg Hx   . Hypotension Neg Hx   . Pseudochol deficiency Neg Hx   . Malignant hyperthermia Neg Hx   . Hypertension Sister   . Diabetes Sister   . Heart disease Sister    History  Substance Use Topics  . Smoking status: Current Every Day Smoker -- 0.20 packs/day for 30 years    Types: Cigarettes  . Smokeless tobacco: Never Used     Comment: 4 -5 cigs/day.Wants to quit.  . Alcohol  Use: No     Comment: sobriety since 2/13,was drinking 7-10 beer/daily   Review of Systems  Constitutional: Negative for fever, activity change, appetite change and fatigue.  HENT: Negative for congestion, facial swelling, rhinorrhea and trouble swallowing.   Eyes: Negative for photophobia and pain.  Respiratory: Positive for cough. Negative for chest tightness and shortness of breath.   Cardiovascular: Negative for chest pain and leg swelling.  Gastrointestinal: Positive for abdominal pain. Negative for nausea, vomiting, diarrhea and constipation.  Endocrine: Negative for polydipsia and polyuria.  Genitourinary: Positive  for flank pain. Negative for dysuria, urgency, decreased urine volume and difficulty urinating.  Musculoskeletal: Negative for back pain and gait problem.  Skin: Negative for color change, rash and wound.  Allergic/Immunologic: Negative for immunocompromised state.  Neurological: Negative for dizziness, facial asymmetry, speech difficulty, weakness, numbness and headaches.  Psychiatric/Behavioral: Negative for confusion, decreased concentration and agitation.   Allergies  Ace inhibitors and Losartan  Home Medications   Prior to Admission medications   Medication Sig Start Date End Date Taking? Authorizing Provider  ACCU-CHEK AVIVA PLUS test strip check blood sugar THREE TIMES DAILY BEFORE MEALS AND AT BEDTIME 12/16/13  Yes Clinton Gallant, MD  ACCU-CHEK FASTCLIX LANCETS MISC 1 each by Does not apply route 3 (three) times daily before meals. Dx code 250.00 insulin requiring 10/07/12  Yes Alejandro Paya, DO  ARIPiprazole (ABILIFY) 15 MG tablet Take 15 mg by mouth at bedtime. Takes with 5 mg and 2 mg for a 22 mg dose   Yes Historical Provider, MD  ARIPiprazole (ABILIFY) 2 MG tablet Take 2 mg by mouth at bedtime. Takes with 15 mg and 5 mg for a 22 mg dose   Yes Historical Provider, MD  ARIPiprazole (ABILIFY) 5 MG tablet Take 5 mg by mouth at bedtime. Takes with 15 mg and 2 mg for a 22 mg dose   Yes Historical Provider, MD  aspirin EC 81 MG tablet Take 81 mg by mouth at bedtime.   Yes Historical Provider, MD  Blood Glucose Monitoring Suppl (ACCU-CHEK NANO SMARTVIEW) W/DEVICE KIT 1 each by Does not apply route 3 (three) times daily. Dx code 250.00 insulin requiring 10/07/12  Yes Dominic Pea, DO  FLUoxetine (PROZAC) 20 MG capsule Take 20 mg by mouth 2 (two) times daily.    Yes Historical Provider, MD  glipiZIDE (GLUCOTROL) 10 MG tablet Take 10 mg by mouth 2 (two) times daily before a meal. 07/22/12  Yes Clinton Gallant, MD  glucose blood (ACCU-CHEK SMARTVIEW) test strip Check blood sugar 3 times a day before  meals and bedtime Dx code E11.65 insulin requiring 11/14/13  Yes Clinton Gallant, MD  glucose blood (AGAMATRIX PRESTO TEST) test strip Use as instructed 12/11/13  Yes Clinton Gallant, MD  hydrochlorothiazide (HYDRODIURIL) 25 MG tablet Take 1 tablet (25 mg total) by mouth daily. 08/22/13  Yes Clinton Gallant, MD  hydrOXYzine (ATARAX/VISTARIL) 10 MG tablet Take 10 mg by mouth daily as needed for itching.    Yes Historical Provider, MD  insulin aspart (NOVOLOG) 100 UNIT/ML injection Inject 20 Units into the skin 2 (two) times daily.   Yes Historical Provider, MD  Insulin Glargine (LANTUS SOLOSTAR) 100 UNIT/ML Solostar Pen Inject 0.65 mLs (65 Units total) into the skin at bedtime 08/28/13  Yes Clinton Gallant, MD  Insulin Pen Needle (B-D UF III MINI PEN NEEDLES) 31G X 5 MM MISC Use to inject insulin 3 times a day dx code 250.00 10/22/12  Yes Clinton Gallant, MD  lovastatin (MEVACOR) 40  MG tablet Take 1 tablet (40 mg total) by mouth at bedtime. 02/07/13  Yes Clinton Gallant, MD  metFORMIN (GLUCOPHAGE) 500 MG tablet Take 1,000 mg by mouth 2 (two) times daily with a meal.   Yes Historical Provider, MD  Pancrelipase, Lip-Prot-Amyl, (CREON) 36000 UNITS CPEP Take 36,000 Units by mouth 3 (three) times daily.   Yes Historical Provider, MD  pantoprazole (PROTONIX) 40 MG tablet Take 1 tablet (40 mg total) by mouth daily. 02/07/13  Yes Clinton Gallant, MD  promethazine (PHENERGAN) 25 MG tablet Take 1 tablet (25 mg total) by mouth every 6 (six) hours as needed for nausea. 08/22/13  Yes Clinton Gallant, MD  Vitamin D, Ergocalciferol, (DRISDOL) 50000 UNITS CAPS capsule Take 50,000 Units by mouth every 7 (seven) days. Every Sunday   Yes Historical Provider, MD  ACCU-CHEK AVIVA PLUS test strip [place] Patient not taking: Reported on 12/20/2013 11/14/13   Clinton Gallant, MD  NOVOLOG FLEXPEN 100 UNIT/ML FlexPen INJECT TEN UNITS into SKIN EVERY DAY Patient not taking: Reported on 12/04/2013 11/14/13   Clinton Gallant, MD   Triage Vitals: BP 112/94  mmHg  Pulse 93  Temp(Src) 97.8 F (36.6 C) (Oral)  Resp 18  Ht _0  (1.727 m)  Wt 202 lb 8 oz (91.853 kg)  BMI 30.80 kg/m2  SpO2 97%  Physical Exam  Constitutional: He is oriented to person, place, and time. He appears well-developed and well-nourished. No distress.  HENT:  Head: Normocephalic and atraumatic.  Mouth/Throat: No oropharyngeal exudate.  Eyes: Pupils are equal, round, and reactive to light.  Neck: Normal range of motion. Neck supple.  Cardiovascular: Normal rate, regular rhythm and normal heart sounds.  Exam reveals no gallop and no friction rub.   No murmur heard. Pulmonary/Chest: Effort normal and breath sounds normal. No respiratory distress. He has no wheezes. He has no rales. He exhibits tenderness.  Right sided chest wall pain noted.  Abdominal: Soft. Bowel sounds are normal. He exhibits no distension and no mass. There is no tenderness. There is no rebound and no guarding.  Musculoskeletal: Normal range of motion. He exhibits no edema or tenderness.  Neurological: He is alert and oriented to person, place, and time.  Skin: Skin is warm and dry.  Psychiatric: He has a normal mood and affect.   ED Course  Procedures (including critical care time)  DIAGNOSTIC STUDIES: Oxygen Saturation is 97% on RA, normal by my interpretation.    COORDINATION OF CARE: 3:40 AM- Discussed plans to order diagnostic lab work. Will give pt 12m injection of dilaudid. Pt advised of plan for treatment and pt agrees.  Labs Review Labs Reviewed  COMPREHENSIVE METABOLIC PANEL - Abnormal; Notable for the following:    Glucose, Bld 239 (*)    Albumin 3.3 (*)    Total Bilirubin <0.2 (*)    Anion gap 17 (*)    All other components within normal limits  LIPASE, BLOOD - Abnormal; Notable for the following:    Lipase 94 (*)    All other components within normal limits  CBC WITH DIFFERENTIAL    Imaging Review No results found.   EKG Interpretation None     MDM   Final  diagnoses:  Chronic abdominal pain    Pt is a 58y.o. male with Pmhx as above who presents with several years of right-sided rib pain/abdominal pain.  The patient initially tells me 2 years, though has told a triage nurse he has had it for 6  years.  He states that he has the pain daily and that is worse at night.  He tells me several times during the interview that this is same pain.  His been having that he keeps coming back to the ED for.  He denies associated fevers, chills, nausea, vomiting or diarrhea.  He states sometimes some things that he eats make it worse.  He admits to drinking one beer tonight to "numb the pain."  Patient states that the pain is improved by narcotics.  He is not currently taking narcotics and has not been in the pain clinic since May.  He does have an upcoming PCP appointment in December 4.  He was seen for the same on 12/04/2013 had unremarkable lab workup, mild gallbladder distention on ultrasound. On exam here, vital signs are stable and patient is in no acute distress.  He has reproducible tenderness to the right lateral chest wall.  Murphy sign.  No abdominal rebound or guarding.  Cardiopulmonary exam otherwise benign. CBC is normal.  CMP was elevated glucose, but normal LFTs.  Patient's lipase is mildly elevated at 94.  However, given description of pain, I do not think that he has had years of an acute pancreatitis, and feel this chronic pain can outpatient workup. He's had approximately 8 visits for the same in the past 1.5 years.  I believe he needs to be back in a pain clinic.  Given that this pain isn't present for years.  I do not feel a prescription for narcotics is appropriate at this time.  Return precautions given for new or worsening symptoms including fever, inability to tolerate liquids, chest pain, shortness of breath.   I personally performed the services described in this documentation, which was scribed in my presence. The recorded information has been  reviewed and is accurate.  Michael Patches, MD 12/22/13 [id number]  Michael Patches, MD 12/22/13 [phone]

## 2013-12-22 ENCOUNTER — Other Ambulatory Visit: Payer: Self-pay | Admitting: Internal Medicine

## 2013-12-26 ENCOUNTER — Encounter: Payer: Self-pay | Admitting: Internal Medicine

## 2013-12-26 ENCOUNTER — Observation Stay: Admission: AD | Admit: 2013-12-26 | Payer: Medicaid Other | Source: Ambulatory Visit | Admitting: Internal Medicine

## 2013-12-26 ENCOUNTER — Ambulatory Visit (INDEPENDENT_AMBULATORY_CARE_PROVIDER_SITE_OTHER): Payer: Medicaid Other | Admitting: Internal Medicine

## 2013-12-26 VITALS — BP 133/84 | HR 105 | Temp 98.2°F | Ht 65.0 in | Wt 196.4 lb

## 2013-12-26 DIAGNOSIS — K860 Alcohol-induced chronic pancreatitis: Secondary | ICD-10-CM

## 2013-12-26 DIAGNOSIS — IMO0002 Reserved for concepts with insufficient information to code with codable children: Secondary | ICD-10-CM

## 2013-12-26 DIAGNOSIS — R1013 Epigastric pain: Secondary | ICD-10-CM

## 2013-12-26 DIAGNOSIS — E1165 Type 2 diabetes mellitus with hyperglycemia: Secondary | ICD-10-CM

## 2013-12-26 DIAGNOSIS — G8929 Other chronic pain: Secondary | ICD-10-CM

## 2013-12-26 LAB — POCT GLYCOSYLATED HEMOGLOBIN (HGB A1C): Hemoglobin A1C: 8.5

## 2013-12-26 LAB — GLUCOSE, CAPILLARY: Glucose-Capillary: 226 mg/dL — ABNORMAL HIGH (ref 70–99)

## 2013-12-26 MED ORDER — PROMETHAZINE HCL 25 MG PO TABS: 25.0000 mg | ORAL_TABLET | Freq: Four times a day (QID) | ORAL | Status: DC | PRN

## 2013-12-26 MED ORDER — INSULIN GLARGINE 100 UNIT/ML SOLOSTAR PEN
PEN_INJECTOR | SUBCUTANEOUS | Status: DC
Start: 2013-12-26 — End: 2014-03-20

## 2013-12-26 NOTE — Progress Notes (Signed)
Subjective:   Patient ID: Michael [name] male   DOB: Dec 11, 1955 58 y.o.   MRN: [id number]  HPI: Michael [name] is a 58 y.o. man pmh uncontrolled DM, HTN, and chronic pancreatitis presents with some abdominal pain. He states that it is 8/10 in intensity, crampy in nature, exacerbated by eating food and worse at night. The patient states that he has returned to drinking alcohol on a daily basis which seems to numb the pain enough for him to sleep but then the following day will exacerbate his pain. The patient has missed several follow-up appointments saying that it had been secondary to some abdominal pain in the setting of his drinking. He continues to take his Creon as prescribed and other pain medication as provided by his pain management doctor. But that has only provide minimal to no relief he is also had some increase in diarrhea and nausea with some vomiting. He denied any hematochezia or hematemesis, or dark tarry stools. He has not had any lightheadedness, syncope, chest pain, or shortness of breath.  He was also recently referred to endocrinology given the nature of his diabetes that was refractory to many treatment changes by this provider. He states that he did not make this appointment.   Past Medical History  Diagnosis Date  . Chronic pancreatitis   . Smoker   . Gastritis due to alcohol without hemorrhage   . HTN (hypertension)   . Pancreatic lesion     appears innocent and related to chronic pancreatitis  . Heavy alcohol use     hx of  . Marijuana use     hx of  . Migraine   . Depression   . Helicobacter pylori gastritis 03/27/2011    on endoscopy, treated with omeprazole, clarithromycin and amoxicillin  . Insomnia   . ACE inhibitor-aggravated angioedema   . Sessile colonic polyp   . Diabetes mellitus   . GERD (gastroesophageal reflux disease)   . Chronic abdominal pain on narcotics 06/27/2012   Current Outpatient Prescriptions  Medication Sig Dispense Refill  .  ACCU-CHEK AVIVA PLUS test strip CHECK BLOOD SUGAR THREE TIMES DAILY (Patient not taking: Reported on 12/20/2013) 100 each 12  . ACCU-CHEK AVIVA PLUS test strip check blood sugar THREE TIMES DAILY BEFORE MEALS AND AT BEDTIME 100 each 12  . ACCU-CHEK AVIVA PLUS test strip check blood sugar THREE TIMES DAILY BEFORE MEALS AND AT BEDTIME 100 each 12  . ACCU-CHEK FASTCLIX LANCETS MISC 1 each by Does not apply route 3 (three) times daily before meals. Dx code 250.00 insulin requiring 102 each 12  . ARIPiprazole (ABILIFY) 15 MG tablet Take 15 mg by mouth at bedtime. Takes with 5 mg and 2 mg for a 22 mg dose    . ARIPiprazole (ABILIFY) 2 MG tablet Take 2 mg by mouth at bedtime. Takes with 15 mg and 5 mg for a 22 mg dose    . ARIPiprazole (ABILIFY) 5 MG tablet Take 5 mg by mouth at bedtime. Takes with 15 mg and 2 mg for a 22 mg dose    . aspirin EC 81 MG tablet Take 81 mg by mouth at bedtime.    . Blood Glucose Monitoring Suppl (ACCU-CHEK NANO SMARTVIEW) W/DEVICE KIT 1 each by Does not apply route 3 (three) times daily. Dx code 250.00 insulin requiring 1 kit 0  . FLUoxetine (PROZAC) 20 MG capsule Take 20 mg by mouth 2 (two) times daily.     [place] glipiZIDE (GLUCOTROL) 10 MG  tablet Take 10 mg by mouth 2 (two) times daily before a meal.    . glucose blood (ACCU-CHEK SMARTVIEW) test strip Check blood sugar 3 times a day before meals and bedtime Dx code E11.65 insulin requiring 100 each 12  . glucose blood (AGAMATRIX PRESTO TEST) test strip Use as instructed 100 each 12  . hydrochlorothiazide (HYDRODIURIL) 25 MG tablet Take 1 tablet (25 mg total) by mouth daily. 90 tablet 4  . hydrOXYzine (ATARAX/VISTARIL) 10 MG tablet Take 10 mg by mouth daily as needed for itching.     . insulin aspart (NOVOLOG) 100 UNIT/ML injection Inject 20 Units into the skin 2 (two) times daily.    . Insulin Glargine (LANTUS SOLOSTAR) 100 UNIT/ML Solostar Pen Inject 0.65 mLs (65 Units total) into the skin at bedtime 30 mL 11  . Insulin Pen  Needle (B-D UF III MINI PEN NEEDLES) 31G X 5 MM MISC Use to inject insulin 3 times a day dx code 250.00 100 each 12  . lovastatin (MEVACOR) 40 MG tablet Take 1 tablet (40 mg total) by mouth at bedtime. 30 tablet 2  . metFORMIN (GLUCOPHAGE) 500 MG tablet Take 1,000 mg by mouth 2 (two) times daily with a meal.    . NOVOLOG FLEXPEN 100 UNIT/ML FlexPen INJECT TEN UNITS into SKIN EVERY DAY (Patient not taking: Reported on 12/04/2013) 15 mL 12  . Pancrelipase, Lip-Prot-Amyl, (CREON) 36000 UNITS CPEP Take 36,000 Units by mouth 3 (three) times daily.    . pantoprazole (PROTONIX) 40 MG tablet Take 1 tablet (40 mg total) by mouth daily. 90 tablet 0  . promethazine (PHENERGAN) 25 MG tablet Take 1 tablet (25 mg total) by mouth every 6 (six) hours as needed for nausea. 15 tablet 0  . Vitamin D, Ergocalciferol, (DRISDOL) 50000 UNITS CAPS capsule Take 50,000 Units by mouth every 7 (seven) days. Every Sunday     No current facility-administered medications for this visit.   Family History  Problem Relation Age of Onset  . Colon cancer Neg Hx   . Stomach cancer Neg Hx   . Anesthesia problems Neg Hx   . Hypotension Neg Hx   . Pseudochol deficiency Neg Hx   . Malignant hyperthermia Neg Hx   . Hypertension Sister   . Diabetes Sister   . Heart disease Sister    History   Social History  . Marital Status: Single    Spouse Name: N/A    Number of Children: 0  . Years of Education: 11   Occupational History  .    [employer]    worked for 6 years  . 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 Smoking status: Current Every Day Smoker -- 0.20 packs/day for 30 years    Types: Cigarettes  . Smokeless tobacco: Never Used     Comment: 4 -5 cigs/day.Wants to quit. Up to 7-8 cigs per day  . Alcohol Use: No     Comment: sobriety since 2/13,was drinking 7-10 beer/daily  . Drug Use: None     Comment: Feb. 2013 last use  . Sexual Activity: None    Other Topics Concern  . None   Social History Narrative   Has been living at 3 different friend's homes since discharge 01/2011. Was living in the shelter prior to admission. Had previously lived with his niece and other family members but he has gotten kicked out each time.  Review of Systems: Pertinent items are noted in HPI. Objective:  Physical Exam: Filed Vitals:   12/26/13 1321  BP: 133/84  Pulse: 105  Temp: 98.2 F (36.8 C)  TempSrc: Oral  Height: 5' 5"  (1.651 m)  Weight: 196 lb 6.4 oz (89.086 kg)  SpO2: 99%   General:smells of alcohol, some nonsensical speech HEENT: PERRL, EOMI, no scleral icterus Cardiac: RRR, no rubs, murmurs or gallops Pulm: clear to auscultation bilaterally, moving normal volumes of air Abd: soft,ttp over epigastric and RUQ, no rebound or guarding, nondistended, BS present Ext: warm and well perfused, no pedal edema Neuro: alert and oriented X3, cranial nerves II-XII grossly intact  Assessment & Plan:  Please see problem oriented charting  Pt discussed with Dr. Dareen Piano

## 2013-12-26 NOTE — Assessment & Plan Note (Signed)
This could not be fully addressed during the visit as the patient decided to leave [facility] and refused admission for pancreatitis evaluation. The patient still has not made his endocrinology referral appointment. Lab Results  Component Value Date   HGBA1C 8.5 12/26/2013   -Refill of Lantus was provided during this visit

## 2013-12-26 NOTE — Patient Instructions (Addendum)
General Instructions: It was recommended you come into the hospital today. If your pain continues to get worse than you need to present to the ED for evaluation. I have refilled your nausea medications.   Thank you for bringing your medicines today. This helps [place] keep you safe from mistakes.   Progress Toward Treatment Goals:  Treatment Goal 08/12/2012  Hemoglobin A1C at goal  Blood pressure improved  Stop smoking -  Prevent falls -    Self Care Goals & Plans:  Self Care Goal 12/26/2013  Manage my medications take my medicines as prescribed; bring my medications to every visit; refill my medications on time  Monitor my health keep track of my blood glucose; bring my glucose meter and log to each visit  Eat healthy foods drink diet soda or water instead of juice or soda; eat more vegetables; eat foods that are low in salt; eat baked foods instead of fried foods; eat fruit for snacks and desserts  Be physically active -  Stop smoking -    Home Blood Glucose Monitoring 09/03/2012  Check my blood sugar 4 times a day  When to check my blood sugar -     Care Management & Community Referrals:  Referral 02/03/2013  Referrals made to [facility]; other (see comments)

## 2013-12-26 NOTE — Assessment & Plan Note (Signed)
This apparently has been ongoing for at least the past 2-3 months. The patient has presented to the ED for evaluation and was just given some narcotics and sent home. The patient did state that some of the narcotics did help relieve the pain. It was highly recommended that the patient be admitted today for pancreatitis evaluation and treatment. This is most likely pancreatitis given his alcohol use which seems to exacerbate the pain and his pain is in the epigastric region. He has had some elevated lipase is currently on his 12/04/13 ED visits as high as 94. He also did have an abdominal ultrasound that time that did show acute process with some mild gallbladder distention. Although admission was in process the patient decided to leave and not be admitted this time.   The patient has decided to leave against medical advice. The patient has a normal mental status, and understands the risks of leaving, including permanent disability and/or death, and has had an opportunity to ask questions about his medical condition. The patient has been informed that he may return for care at any time, and follow up has been arranged.  -the pt refused labs and other intervention at this time -refill of his promethazine was given

## 2013-12-29 NOTE — Progress Notes (Signed)
INTERNAL MEDICINE TEACHING ATTENDING ADDENDUM - Michael Sippel, MD: I reviewed and discussed at the time of visit with the resident Dr. Sadek, the patient's medical history, physical examination, diagnosis and results of pertinent tests and treatment and I agree with the patient's care as documented.  

## 2014-01-01 ENCOUNTER — Ambulatory Visit (INDEPENDENT_AMBULATORY_CARE_PROVIDER_SITE_OTHER): Payer: Medicaid Other | Admitting: Internal Medicine

## 2014-01-01 ENCOUNTER — Encounter: Payer: Self-pay | Admitting: Internal Medicine

## 2014-01-01 DIAGNOSIS — E1165 Type 2 diabetes mellitus with hyperglycemia: Secondary | ICD-10-CM

## 2014-01-01 DIAGNOSIS — G8929 Other chronic pain: Secondary | ICD-10-CM

## 2014-01-01 DIAGNOSIS — IMO0002 Reserved for concepts with insufficient information to code with codable children: Secondary | ICD-10-CM

## 2014-01-01 DIAGNOSIS — M47812 Spondylosis without myelopathy or radiculopathy, cervical region: Secondary | ICD-10-CM | POA: Insufficient documentation

## 2014-01-01 DIAGNOSIS — M542 Cervicalgia: Secondary | ICD-10-CM

## 2014-01-01 DIAGNOSIS — R1013 Epigastric pain: Secondary | ICD-10-CM

## 2014-01-01 LAB — HM DIABETES EYE EXAM

## 2014-01-01 LAB — GLUCOSE, CAPILLARY: GLUCOSE-CAPILLARY: 73 mg/dL (ref 70–99)

## 2014-01-01 MED ORDER — MELOXICAM 7.5 MG PO TABS: 7.5000 mg | ORAL_TABLET | Freq: Every day | ORAL | Status: DC

## 2014-01-01 NOTE — Assessment & Plan Note (Addendum)
Lab Results  Component Value Date   HGBA1C 8.5 12/26/2013   HGBA1C 13.3 08/22/2013   HGBA1C 11.1 05/09/2013     Assessment: Diabetes control:  Uncontrolled Progress toward A1C goal:   Not at goal. Goal < 7. Comments: Pt brought a glucometer that he last used 11/02/2013. He say she has a more current glucometer but did not bring it today.   Plan: Medications:  continue current medications Home glucose monitoring: Frequency:  4 times daily Timing:   Instruction/counseling given: reminded to bring blood glucose meter & log to each visit, reminded to bring medications to each visit and discussed diet Educational resources provided: brochure, handout Self management tools provided: copy of home glucose meter download Other plans: Pt to bring his glucometer in a week. Pt blood sugar - 73 in clinic. He hadn't eaten this morning. Hoping to get lunch at the christmas get together here in the hospital.  [facility] patient to verify his meds, as there appears to be duplicates on his med list for Novolog and Abilify. Home number- Pt not available. Mobile- Wrong number. Will call patient again and try to update med list.

## 2014-01-01 NOTE — Progress Notes (Signed)
Patient ID: Michael [name], male   DOB: 1956-01-09, 58 y.o.   MRN: [id number]   Subjective:   Patient ID: Michael [name] male   DOB: Aug 18, 1955 58 y.o.   MRN: [id number]  HPI: Michael [name] is a 58 y.o. with PMH listed below. Presented today with complaints of neck pain that has been going on for a while at least 3 years. No hx trauma. Pts says he has had MRI- This year, on [address], done which showed some abnormality, he was told he has something bulging, he is not sure of the details. Pt has tingling left arm that is present all the time, numbness bilaterally which is intermittent, without weakness. Occasionally feels some numbness in his legs, no lower extremity weaknes, No fecal or urinary incontinece. Has tried tylenol without relief, also ibuprofen. Pt was going to a pain clinic, where he was prescribed oxycodone 10mg . Last visit- may 20th, he was dismissed because he was told there was some marijuana in his urine.  He had been going to the pain clinic for about a year. Pt today is asking for pain meds.  Past Medical History  Diagnosis Date  . Chronic pancreatitis   . Smoker   . Gastritis due to alcohol without hemorrhage   . HTN (hypertension)   . Pancreatic lesion     appears innocent and related to chronic pancreatitis  . Heavy alcohol use     hx of  . Marijuana use     hx of  . Migraine   . Depression   . Helicobacter pylori gastritis 03/27/2011    on endoscopy, treated with omeprazole, clarithromycin and amoxicillin  . Insomnia   . ACE inhibitor-aggravated angioedema   . Sessile colonic polyp   . Diabetes mellitus   . GERD (gastroesophageal reflux disease)   . Chronic abdominal pain on narcotics 06/27/2012   Current Outpatient Prescriptions  Medication Sig Dispense Refill  . ACCU-CHEK AVIVA PLUS test strip CHECK BLOOD SUGAR THREE TIMES DAILY (Patient not taking: Reported on 12/20/2013) 100 each 12  . ACCU-CHEK AVIVA PLUS test strip check blood sugar THREE TIMES DAILY  BEFORE MEALS AND AT BEDTIME 100 each 12  . ACCU-CHEK AVIVA PLUS test strip check blood sugar THREE TIMES DAILY BEFORE MEALS AND AT BEDTIME 100 each 12  . ACCU-CHEK FASTCLIX LANCETS MISC 1 each by Does not apply route 3 (three) times daily before meals. Dx code 250.00 insulin requiring 102 each 12  . ARIPiprazole (ABILIFY) 15 MG tablet Take 15 mg by mouth at bedtime. Takes with 5 mg and 2 mg for a 22 mg dose    . ARIPiprazole (ABILIFY) 2 MG tablet Take 2 mg by mouth at bedtime. Takes with 15 mg and 5 mg for a 22 mg dose    . ARIPiprazole (ABILIFY) 5 MG tablet Take 5 mg by mouth at bedtime. Takes with 15 mg and 2 mg for a 22 mg dose    . aspirin EC 81 MG tablet Take 81 mg by mouth at bedtime.    . Blood Glucose Monitoring Suppl (ACCU-CHEK NANO SMARTVIEW) W/DEVICE KIT 1 each by Does not apply route 3 (three) times daily. Dx code 250.00 insulin requiring 1 kit 0  . FLUoxetine (PROZAC) 20 MG capsule Take 20 mg by mouth 2 (two) times daily.     Marland Kitchen glipiZIDE (GLUCOTROL) 10 MG tablet Take 10 mg by mouth 2 (two) times daily before a meal.    . glucose blood (ACCU-CHEK SMARTVIEW) test  strip Check blood sugar 3 times a day before meals and bedtime Dx code E11.65 insulin requiring 100 each 12  . glucose blood (AGAMATRIX PRESTO TEST) test strip Use as instructed 100 each 12  . hydrochlorothiazide (HYDRODIURIL) 25 MG tablet Take 1 tablet (25 mg total) by mouth daily. 90 tablet 4  . hydrOXYzine (ATARAX/VISTARIL) 10 MG tablet Take 10 mg by mouth daily as needed for itching.     . insulin aspart (NOVOLOG) 100 UNIT/ML injection Inject 20 Units into the skin 2 (two) times daily.    . Insulin Glargine (LANTUS SOLOSTAR) 100 UNIT/ML Solostar Pen Inject 0.65 mLs (65 Units total) into the skin at bedtime 30 mL 11  . Insulin Pen Needle (B-D UF III MINI PEN NEEDLES) 31G X 5 MM MISC Use to inject insulin 3 times a day dx code 250.00 100 each 12  . lovastatin (MEVACOR) 40 MG tablet Take 1 tablet (40 mg total) by mouth at  bedtime. 30 tablet 2  . metFORMIN (GLUCOPHAGE) 500 MG tablet Take 1,000 mg by mouth 2 (two) times daily with a meal.    . NOVOLOG FLEXPEN 100 UNIT/ML FlexPen INJECT TEN UNITS into SKIN EVERY DAY (Patient not taking: Reported on 12/04/2013) 15 mL 12  . Pancrelipase, Lip-Prot-Amyl, (CREON) 36000 UNITS CPEP Take 36,000 Units by mouth 3 (three) times daily.    . pantoprazole (PROTONIX) 40 MG tablet Take 1 tablet (40 mg total) by mouth daily. 90 tablet 0  . promethazine (PHENERGAN) 25 MG tablet Take 1 tablet (25 mg total) by mouth every 6 (six) hours as needed for nausea. 30 tablet 0  . Vitamin D, Ergocalciferol, (DRISDOL) 50000 UNITS CAPS capsule Take 50,000 Units by mouth every 7 (seven) days. Every Sunday     No current facility-administered medications for this visit.   Family History  Problem Relation Age of Onset  . Colon cancer Neg Hx   . Stomach cancer Neg Hx   . Anesthesia problems Neg Hx   . Hypotension Neg Hx   . Pseudochol deficiency Neg Hx   . Malignant hyperthermia Neg Hx   . Hypertension Sister   . Diabetes Sister   . Heart disease Sister    History   Social History  . Marital Status: Single    Spouse Name: N/A    Number of Children: 0  . Years of Education: 11   Occupational History  .    [employer]    worked for 6 years  . 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 Smoking status: Current Every Day Smoker -- 0.60 packs/day for 30 years    Types: Cigarettes  . Smokeless tobacco: Never Used     Comment: 4 -5 cigs/day.Wants to quit. Up to 7-8 cigs per day  . Alcohol Use: No     Comment: sobriety since 2/13,was drinking 7-10 beer/daily  . Drug Use: None     Comment: Feb. 2013 last use  . Sexual Activity: None   Other Topics Concern  . None   Social History Narrative   Has been living at 3 different friend's homes since discharge 01/2011. Was living in the shelter prior to admission. Had previously  lived with his niece and other family members but he has gotten kicked out each time.    Review of Systems: CONSTITUTIONAL- No Fever, weightloss, night sweat or change in appetite. SKIN- No Rash, colour changes or  itching. HEAD- No Headache or dizziness. EYES- No Vision loss, pain, redness, double or blurred vision. Mouth/throat- No Sorethroat, dentures, or bleeding gums. RESPIRATORY- No Cough or SOB. CARDIAC- No Palpitations, DOE, PND or chest pain. GI- No nausea, vomiting, diarrhoea, constipation, abd pain. URINARY- No Frequency, urgency, straining or dysuria. NEUROLOGIC- No Numbness, syncope, seizures or burning. [facility]- Denies depression or anxiety.  Objective:  Physical Exam: Filed Vitals:   01/01/14 1030  Height: 5' 5"  (1.651 m)  Weight: 191 lb 11.2 oz (86.955 kg)   GENERAL- alert, co-operative, appears as stated age, not in any distress. HEENT- Atraumatic, normocephalic, PERRL, EOMI, oral mucosa appears moist, mild tenderness on palpation of posterior cervical region, neck supple, CARDIAC- RRR, no murmurs, rubs or gallops. RESP- Moving equal volumes of air, and clear to auscultation bilaterally, no wheezes or crackles. ABDOMEN- Soft, nontender, no guarding or rebound, no palpable masses or organomegaly, bowel sounds present. BACK- Normal curvature of the spine, no CVA tenderness. NEURO- No obvious Cr N abnormality, strenght upper and lower extremities- 5/5, Sensation intact- globally,  Gait- Normal. EXTREMITIES- pulse 2+, symmetric, no pedal edema. SKIN- Warm, dry, No rash or lesion. PSYCH- Normal mood and affect, appropriate thought content and speech.  Assessment & Plan:   The patient's case and plan of care was discussed with attending physician, Dr. Marinda Elk.  Please see problem based charting for assessment and plan.

## 2014-01-01 NOTE — Assessment & Plan Note (Signed)
Has chronic pain in epigastric region, non tender to palpation.  Some mild radiation to back which is stable and unchanged/normal for him, hence the pain meds he takes.

## 2014-01-01 NOTE — Patient Instructions (Addendum)
General Instructions:   I have put in an order for an xray of your neck. We will also refer you to pain clinic. For now i sent in a medication called Mobic for the pain you are having in your neck.  For now i want you to try a new medication called Mobic for your pain. Please bring your glucometer with you in a week. Please eat your regular meals. Do not forget diet and exercise. Avoid regular sodas and sweet teas.   Please bring your medicines with you each time you come to clinic.  Medicines may include prescription medications, over-the-counter medications, herbal remedies, eye drops, vitamins, or other pills.

## 2014-01-01 NOTE — Assessment & Plan Note (Signed)
Chronic neck pain, likely with radiculopathy down his left arm tingling). No focal deficits- no weakness, no urinary of fecal problems. Says he has had some neck imaging done specifically an MRI earlier this year- [place] [address]. I do not see the results in EPIC, no do I see a refferal or order suggesting prior neck problems. Pt says that ibuprofen and tylenol did not help. Pt was going to a pain clinic, was dismissed on may on the basis of a UDS positive for marijuana. In 2013- FYI says pt not to be given pain meds from out clinic anymore, considering he lost a prescription for percocets, and [place] database say she picked up prescription.  Plan- Xray neck to start, as he has no focal weakness or deficits, suggesting spinal cord compression. - Told patient to bring any records that would give [place] an idea where this MRi was done, so records can be obtained- pt agreed.  - Mobic- 7.5mg  daily- he has normal kidney function and no hx of Ulcers. Can uptitrate as tolerated. - Refferal to pain clinic.

## 2014-01-02 ENCOUNTER — Other Ambulatory Visit: Payer: Self-pay | Admitting: Dietician

## 2014-01-02 DIAGNOSIS — E1165 Type 2 diabetes mellitus with hyperglycemia: Secondary | ICD-10-CM

## 2014-01-02 DIAGNOSIS — IMO0002 Reserved for concepts with insufficient information to code with codable children: Secondary | ICD-10-CM

## 2014-01-02 NOTE — Progress Notes (Signed)
Internal Medicine Clinic Attending  Date of Clinic Visit: 01/01/2014  Case discussed with Dr. Denton Brick soon after the resident saw the patient.  We reviewed the resident's history and exam and pertinent patient test results.  I agree with the assessment, diagnosis, and plan of care documented in the resident's note, with the following additional comments.  Medication list needs to be clarified, to remove duplicate entries and and specify Novolog insulin dosing regimen.

## 2014-01-04 NOTE — Addendum Note (Signed)
Addended by: Bethena Roys on: 01/04/2014 12:04 AM   Modules accepted: Orders, Medications

## 2014-01-05 ENCOUNTER — Encounter: Payer: Self-pay | Admitting: *Deleted

## 2014-01-13 ENCOUNTER — Emergency Department (HOSPITAL_COMMUNITY): Payer: Medicaid Other

## 2014-01-13 ENCOUNTER — Encounter (HOSPITAL_COMMUNITY): Payer: Self-pay | Admitting: Emergency Medicine

## 2014-01-13 ENCOUNTER — Emergency Department (HOSPITAL_COMMUNITY)
Admission: EM | Admit: 2014-01-13 | Discharge: 2014-01-13 | Disposition: A | Discharge status: Home / Self Care | Payer: Medicaid Other | Attending: Emergency Medicine | Admitting: Emergency Medicine

## 2014-01-13 DIAGNOSIS — Z8669 Personal history of other diseases of the nervous system and sense organs: Secondary | ICD-10-CM | POA: Diagnosis not present

## 2014-01-13 DIAGNOSIS — G8929 Other chronic pain: Secondary | ICD-10-CM | POA: Diagnosis not present

## 2014-01-13 DIAGNOSIS — Z720 Tobacco use: Secondary | ICD-10-CM | POA: Diagnosis not present

## 2014-01-13 DIAGNOSIS — K219 Gastro-esophageal reflux disease without esophagitis: Secondary | ICD-10-CM | POA: Diagnosis not present

## 2014-01-13 DIAGNOSIS — Z791 Long term (current) use of non-steroidal anti-inflammatories (NSAID): Secondary | ICD-10-CM | POA: Insufficient documentation

## 2014-01-13 DIAGNOSIS — Z8619 Personal history of other infectious and parasitic diseases: Secondary | ICD-10-CM | POA: Insufficient documentation

## 2014-01-13 DIAGNOSIS — E119 Type 2 diabetes mellitus without complications: Secondary | ICD-10-CM | POA: Insufficient documentation

## 2014-01-13 DIAGNOSIS — Z794 Long term (current) use of insulin: Secondary | ICD-10-CM | POA: Diagnosis not present

## 2014-01-13 DIAGNOSIS — Z87828 Personal history of other (healed) physical injury and trauma: Secondary | ICD-10-CM | POA: Insufficient documentation

## 2014-01-13 DIAGNOSIS — I10 Essential (primary) hypertension: Secondary | ICD-10-CM | POA: Insufficient documentation

## 2014-01-13 DIAGNOSIS — J019 Acute sinusitis, unspecified: Secondary | ICD-10-CM

## 2014-01-13 DIAGNOSIS — Z79899 Other long term (current) drug therapy: Secondary | ICD-10-CM | POA: Insufficient documentation

## 2014-01-13 DIAGNOSIS — F329 Major depressive disorder, single episode, unspecified: Secondary | ICD-10-CM | POA: Insufficient documentation

## 2014-01-13 DIAGNOSIS — Z86010 Personal history of colonic polyps: Secondary | ICD-10-CM | POA: Insufficient documentation

## 2014-01-13 DIAGNOSIS — R519 Headache, unspecified: Secondary | ICD-10-CM

## 2014-01-13 DIAGNOSIS — R51 Headache: Secondary | ICD-10-CM | POA: Diagnosis present

## 2014-01-13 DIAGNOSIS — Z7982 Long term (current) use of aspirin: Secondary | ICD-10-CM | POA: Insufficient documentation

## 2014-01-13 MED ORDER — MORPHINE SULFATE 4 MG/ML IJ SOLN
4.0000 mg | Freq: Once | INTRAMUSCULAR | Status: AC
  Administered 2014-01-13: 4 mg via INTRAVENOUS
  Filled 2014-01-13: qty 1

## 2014-01-13 MED ORDER — SODIUM CHLORIDE 0.9 % IV SOLN
1000.0000 mL | INTRAVENOUS | Status: DC
  Administered 2014-01-13: 1000 mL via INTRAVENOUS

## 2014-01-13 MED ORDER — SODIUM CHLORIDE 0.9 % IV SOLN
1000.0000 mL | Freq: Once | INTRAVENOUS | Status: AC
  Administered 2014-01-13: 1000 mL via INTRAVENOUS

## 2014-01-13 MED ORDER — HYDROCODONE-ACETAMINOPHEN 5-325 MG PO TABS: 2.0000 | ORAL_TABLET | ORAL | Status: DC | PRN

## 2014-01-13 MED ORDER — KETOROLAC TROMETHAMINE 30 MG/ML IJ SOLN
30.0000 mg | Freq: Once | INTRAMUSCULAR | Status: AC
  Administered 2014-01-13: 30 mg via INTRAVENOUS
  Filled 2014-01-13: qty 1

## 2014-01-13 MED ORDER — METOCLOPRAMIDE HCL 5 MG/ML IJ SOLN
10.0000 mg | Freq: Once | INTRAMUSCULAR | Status: AC
  Administered 2014-01-13: 10 mg via INTRAVENOUS
  Filled 2014-01-13: qty 2

## 2014-01-13 MED ORDER — AZITHROMYCIN 250 MG PO TABS: ORAL_TABLET | ORAL | Status: DC

## 2014-01-13 MED ORDER — DIPHENHYDRAMINE HCL 50 MG/ML IJ SOLN
25.0000 mg | Freq: Once | INTRAMUSCULAR | Status: AC
  Administered 2014-01-13: 25 mg via INTRAVENOUS
  Filled 2014-01-13: qty 1

## 2014-01-13 NOTE — ED Notes (Signed)
Patient transported to CT 

## 2014-01-13 NOTE — ED Notes (Signed)
Pt. arrived with EMS from home reports chronic right side headache  1 1/2 months , denies injury , referred by his PCP to a pain clinic but has not arrange appointment . No nausea / no blurred vision . Denies fever or chills.

## 2014-01-13 NOTE — ED Provider Notes (Signed)
CSN: [id number]     Arrival date & time 01/13/14  0228 History   First MD Initiated Contact with Patient 01/13/14 0244    This chart was scribed for Delora Fuel, MD by Terressa Koyanagi, ED Scribe. This patient was seen in room A10C/A10C and the patient's care was started at 2:53 AM.  Chief Complaint  Patient presents with  . Headache   The history is provided by the patient. No language interpreter was used.   PCP: Clinton Gallant, MD HPI Comments: Michael [name] is a 58 y.o. male, with an extensive medical Hx noted below including daily tobacco use, brought in by ambulance, who presents to the Emergency Department complaining of acute, atraumatic, intermittent right sided HA onset 1.5 months ago. Pt states he has no prior Hx of right sided HAs. Pt describes his pain as an aching pain and rates it a 10 out of 10. Pt states that the pain worsens as the day progresses. Pt denies taking any measures at home to alleviate his Sx. Pt also complains of associated intermittent nausea. Pt denies blurred vision, fever, chills.     Past Medical History  Diagnosis Date  . Chronic pancreatitis   . Smoker   . Gastritis due to alcohol without hemorrhage   . HTN (hypertension)   . Pancreatic lesion     appears innocent and related to chronic pancreatitis  . Heavy alcohol use     hx of  . Marijuana use     hx of  . Migraine   . Depression   . Helicobacter pylori gastritis 03/27/2011    on endoscopy, treated with omeprazole, clarithromycin and amoxicillin  . Insomnia   . ACE inhibitor-aggravated angioedema   . Sessile colonic polyp   . Diabetes mellitus   . GERD (gastroesophageal reflux disease)   . Chronic abdominal pain on narcotics 06/27/2012   Past Surgical History  Procedure Laterality Date  . Esophagogastroduodenoscopy  03/22/2011    Procedure: ESOPHAGOGASTRODUODENOSCOPY (EGD);  Surgeon: Gatha Mayer, MD;  Location: [facility];  Service: Endoscopy;  Laterality: N/A;  egd with balloon   .  Balloon dilation  03/22/2011    Procedure: BALLOON DILATION;  Surgeon: Gatha Mayer, MD;  Location: [facility];  Service: Endoscopy;  Laterality: N/A;  . Eus  04/20/2011    Procedure: UPPER ENDOSCOPIC ULTRASOUND (EUS) LINEAR;  Surgeon: Milus Banister, MD;  Location: [facility];  Service: Endoscopy;  Laterality: N/A;  . Colonoscopy w/ biopsies and polypectomy  09/12/11  . Colonoscopy N/A 05/22/2012    Procedure: COLONOSCOPY;  Surgeon: Gatha Mayer, MD;  Location: [facility];  Service: Endoscopy;  Laterality: N/A;   Family History  Problem Relation Age of Onset  . Colon cancer Neg Hx   . Stomach cancer Neg Hx   . Anesthesia problems Neg Hx   . Hypotension Neg Hx   . Pseudochol deficiency Neg Hx   . Malignant hyperthermia Neg Hx   . Hypertension Sister   . Diabetes Sister   . Heart disease Sister    History  Substance Use Topics  . Smoking status: Current Every Day Smoker -- 0.60 packs/day for 30 years    Types: Cigarettes  . Smokeless tobacco: Never Used     Comment: 4 -5 cigs/day.Wants to quit. Up to 7-8 cigs per day  . Alcohol Use: No     Comment: sobriety since 2/13,was drinking 7-10 beer/daily    Review of Systems  Constitutional: Negative for chills.  Eyes: Negative  for visual disturbance.  Psychiatric/Behavioral: Negative for confusion.  All other systems reviewed and are negative.     Allergies  Ace inhibitors and Losartan  Home Medications   Prior to Admission medications   Medication Sig Start Date End Date Taking? Authorizing Provider  ARIPiprazole (ABILIFY) 15 MG tablet Take 15 mg by mouth at bedtime. Takes with 5 mg and 2 mg for a 22 mg dose   Yes Historical Provider, MD  ARIPiprazole (ABILIFY) 2 MG tablet Take 2 mg by mouth at bedtime. Takes with 15 mg and 5 mg for a 22 mg dose   Yes Historical Provider, MD  ARIPiprazole (ABILIFY) 5 MG tablet Take 5 mg by mouth at bedtime. Takes with 15 mg and 2 mg for a 22 mg dose   Yes Historical Provider, MD   aspirin EC 81 MG tablet Take 81 mg by mouth at bedtime.   Yes Historical Provider, MD  FLUoxetine (PROZAC) 20 MG capsule Take 20 mg by mouth 2 (two) times daily.    Yes Historical Provider, MD  glipiZIDE (GLUCOTROL) 10 MG tablet Take 10 mg by mouth 2 (two) times daily before a meal. 07/22/12  Yes Clinton Gallant, MD  hydrochlorothiazide (HYDRODIURIL) 25 MG tablet Take 1 tablet (25 mg total) by mouth daily. 08/22/13  Yes Clinton Gallant, MD  hydrOXYzine (ATARAX/VISTARIL) 10 MG tablet Take 10 mg by mouth daily as needed for itching.    Yes Historical Provider, MD  insulin aspart (NOVOLOG) 100 UNIT/ML injection Inject 20 Units into the skin 2 (two) times daily.   Yes Historical Provider, MD  Insulin Glargine (LANTUS SOLOSTAR) 100 UNIT/ML Solostar Pen Inject 0.65 mLs (65 Units total) into the skin at bedtime 12/26/13  Yes Clinton Gallant, MD  lovastatin (MEVACOR) 40 MG tablet Take 1 tablet (40 mg total) by mouth at bedtime. 02/07/13  Yes Clinton Gallant, MD  meloxicam (MOBIC) 7.5 MG tablet Take 1 tablet (7.5 mg total) by mouth daily. 01/01/14  Yes Ejiroghene E Emokpae, MD  metFORMIN (GLUCOPHAGE) 500 MG tablet Take 1,000 mg by mouth 2 (two) times daily with a meal.   Yes Historical Provider, MD  Pancrelipase, Lip-Prot-Amyl, (CREON) 36000 UNITS CPEP Take 36,000 Units by mouth 3 (three) times daily.   Yes Historical Provider, MD  pantoprazole (PROTONIX) 40 MG tablet Take 1 tablet (40 mg total) by mouth daily. 02/07/13  Yes Clinton Gallant, MD  promethazine (PHENERGAN) 25 MG tablet Take 1 tablet (25 mg total) by mouth every 6 (six) hours as needed for nausea. 12/26/13  Yes Clinton Gallant, MD  Vitamin D, Ergocalciferol, (DRISDOL) 50000 UNITS CAPS capsule Take 50,000 Units by mouth every 7 (seven) days. Every Sunday   Yes Historical Provider, MD  ACCU-CHEK AVIVA PLUS test strip check blood sugar THREE TIMES DAILY BEFORE MEALS AND AT BEDTIME 12/16/13   Clinton Gallant, MD  Blood Glucose Monitoring Suppl (ACCU-CHEK NANO SMARTVIEW) W/DEVICE KIT 1 each  by Does not apply route 3 (three) times daily. Dx code 250.00 insulin requiring 10/07/12   Dominic Pea, DO  glucose blood (ACCU-CHEK SMARTVIEW) test strip Check blood sugar 3 times a day before meals and bedtime Dx code E11.65 insulin requiring 11/14/13   Clinton Gallant, MD  glucose blood (AGAMATRIX PRESTO TEST) test strip Use as instructed 12/11/13   Clinton Gallant, MD  Insulin Pen Needle (B-D UF III MINI PEN NEEDLES) 31G X 5 MM MISC Use to inject insulin 3 times a day dx code 250.00 10/22/12   Clinton Gallant, MD   Triage  Vitals: BP 130/85 mmHg  Pulse 89  Temp(Src) 98.2 F (36.8 C) (Oral)  Resp 16  Ht $R'5\' 4"'lY$  (1.626 m)  Wt 196 lb (88.905 kg)  BMI 33.63 kg/m2  SpO2 96% Physical Exam  Constitutional: He is oriented to person, place, and time. He appears well-developed and well-nourished. No distress.  HENT:  Head: Normocephalic and atraumatic.  Mild to moderate tenderness over right temporalis and frontalis. Tenderness to palpation at the insertion of the right paracervical muscles.   Eyes: Conjunctivae and EOM are normal. Pupils are equal, round, and reactive to light.  Neck: Normal range of motion. Neck supple. No JVD present.  Cardiovascular: Normal rate, regular rhythm and normal heart sounds.   No murmur heard. Pulmonary/Chest: Effort normal and breath sounds normal. No respiratory distress. He has no wheezes. He has no rales.  Abdominal: Soft. Bowel sounds are normal. He exhibits no distension and no mass. There is no tenderness.  Musculoskeletal: Normal range of motion. He exhibits no edema.  Lymphadenopathy:    He has no cervical adenopathy.  Neurological: He is alert and oriented to person, place, and time. He has normal reflexes. No cranial nerve deficit. Coordination normal.  Skin: Skin is warm and dry. No rash noted.  Psychiatric: He has a normal mood and affect. His behavior is normal. Thought content normal.  Nursing note and vitals reviewed.   ED Course  Procedures (including  critical care time) DIAGNOSTIC STUDIES: Oxygen Saturation is 96% on RA, nl by my interpretation.    COORDINATION OF CARE: 2:59 AM-Discussed treatment plan which includes meds with pt at bedside and pt agreed to plan. Pt advised to eliminate tobacco use.   Imaging Review Ct Head Wo Contrast  01/13/2014   CLINICAL DATA:  58 year old male with intermittent right side headache for 1.5 months, increasing in frequency and severity. No known injury. Initial encounter.  EXAM: CT HEAD WITHOUT CONTRAST  TECHNIQUE: Contiguous axial images were obtained from the base of the skull through the vertex without intravenous contrast.  COMPARISON:  None.  FINDINGS: Paranasal sinus mucoperiosteal thickening. Bubbly opacity in the right sphenoid sinus.  Tympanic cavities are clear. Mastoids are somewhat hypoplastic but clear. No acute osseous abnormality identified.  Visualized orbit soft tissues are within normal limits. No acute scalp soft tissue findings.  Calcified atherosclerosis at the skull base. Cerebral volume at the lower limits of normal for age. Patchy bilateral mostly periventricular white matter hypodensity is nonspecific. Hypodensity in the right thalamus compatible with age indeterminate lacunar infarct. No cortical encephalomalacia or acute cortically based infarct identified. No midline shift, mass effect, or evidence of intracranial mass lesion. No ventriculomegaly. No acute intracranial hemorrhage identified. No suspicious intracranial vascular hyperdensity.  IMPRESSION: 1. Hypodensity in the right thalamus and cerebral white matter suggesting small vessel disease. 2. No definite acute intracranial abnormality. 3. Acute on chronic paranasal sinusitis.   Electronically Signed   By: Lars Pinks M.D.   On: 01/13/2014 07:25   Images viewed by me.  MDM   Final diagnoses:  Headache, unspecified headache type    Headache in pattern most suggestive of muscle contraction headache with tenderness over frontal  Allison temporalis muscles and over the insertion of paracervical muscles. No red physis suggest more serious headache cause. He was treated with headache cocktail of IV fluids, diphenhydramine, and metoclopramide but he stated no relief of his pain with this. He was then given ketorolac which does not give him relief. He was given a dose of morphine and  sent for CT scan. CT showed no acute changes and nothing to account for his headache. He stated slight relief of headache with morphine. Case is signed out to Dr. Stark Jock at this point.  I personally performed the services described in this documentation, which was scribed in my presence. The recorded information has been reviewed and is accurate.     Delora Fuel, MD 50/15/86 [id number]

## 2014-01-13 NOTE — ED Provider Notes (Signed)
Care's in from Dr. Roxanne Mins at shift change. Patient had head CT performed which revealed no acute intracranial process, however did show acute on chronic sinusitis. This will be treated with Zithromax. He was given an additional dose of pain medication and will be discharged to home.  Veryl Speak, MD 01/13/14 0800

## 2014-01-13 NOTE — ED Notes (Signed)
Ordered diabetic tray. Spoke with Tamela Oddi.

## 2014-01-13 NOTE — ED Notes (Signed)
MD at bedside. 

## 2014-01-13 NOTE — Discharge Instructions (Signed)
°  Zithromax as prescribed.  Hydrocodone as prescribed as needed for pain.  Follow-up with your primary Dr. if not improving in the next 4-5 days.   Sinus Headache A sinus headache happens when your sinuses become clogged or puffy (swollen). Sinus headaches can be mild or severe. HOME CARE  Take your medicines (antibiotics) as told. Finish them even if you start to feel better.  Only take medicine as told by your doctor.  Use a nose spray if you feel stuffed up (congested). GET HELP RIGHT AWAY IF:  You have a fever.  You have trouble seeing.  You suddenly have pain in your face or head.  You start to twitch or shake (seizure).  You are confused.  You get headaches more than once a week.  Light or sound bothers you.  You feel sick to your stomach (nauseous) or throw up (vomit).  Your headaches do not get better with treatment. MAKE SURE YOU:  Understand these instructions.  Will watch your condition.  Will get help right away if you are not doing well or get worse. Document Released: 05/11/2010 Document Revised: 04/03/2011 Document Reviewed: 05/11/2010 [facility] Patient Information 2015 [place], [place]. This information is not intended to replace advice given to you by your health care provider. Make sure you discuss any questions you have with your health care provider.

## 2014-01-13 NOTE — ED Notes (Signed)
The pt is c/o his headache not any better.  Up rubbing his head

## 2014-01-19 ENCOUNTER — Ambulatory Visit: Payer: Medicaid Other | Admitting: *Deleted

## 2014-01-19 ENCOUNTER — Ambulatory Visit (INDEPENDENT_AMBULATORY_CARE_PROVIDER_SITE_OTHER): Payer: Medicaid Other | Admitting: Internal Medicine

## 2014-01-19 ENCOUNTER — Encounter: Payer: Self-pay | Admitting: Internal Medicine

## 2014-01-19 ENCOUNTER — Telehealth: Payer: Self-pay | Admitting: Licensed Clinical Social Worker

## 2014-01-19 ENCOUNTER — Ambulatory Visit (HOSPITAL_COMMUNITY)
Admission: RE | Admit: 2014-01-19 | Discharge: 2014-01-19 | Disposition: A | Discharge status: Home / Self Care | Payer: Medicaid Other | Source: Ambulatory Visit | Attending: Internal Medicine | Admitting: Internal Medicine

## 2014-01-19 VITALS — BP 134/95 | HR 103 | Temp 98.2°F | Wt 189.2 lb

## 2014-01-19 DIAGNOSIS — M4802 Spinal stenosis, cervical region: Secondary | ICD-10-CM | POA: Insufficient documentation

## 2014-01-19 DIAGNOSIS — E1165 Type 2 diabetes mellitus with hyperglycemia: Secondary | ICD-10-CM

## 2014-01-19 DIAGNOSIS — M542 Cervicalgia: Secondary | ICD-10-CM

## 2014-01-19 DIAGNOSIS — Z23 Encounter for immunization: Secondary | ICD-10-CM

## 2014-01-19 DIAGNOSIS — J0110 Acute frontal sinusitis, unspecified: Secondary | ICD-10-CM

## 2014-01-19 DIAGNOSIS — M5032 Other cervical disc degeneration, mid-cervical region: Secondary | ICD-10-CM | POA: Diagnosis not present

## 2014-01-19 DIAGNOSIS — M2578 Osteophyte, vertebrae: Secondary | ICD-10-CM | POA: Insufficient documentation

## 2014-01-19 DIAGNOSIS — IMO0002 Reserved for concepts with insufficient information to code with codable children: Secondary | ICD-10-CM

## 2014-01-19 DIAGNOSIS — R200 Anesthesia of skin: Secondary | ICD-10-CM | POA: Insufficient documentation

## 2014-01-19 DIAGNOSIS — Z0000 Encounter for general adult medical examination without abnormal findings: Secondary | ICD-10-CM

## 2014-01-19 DIAGNOSIS — J019 Acute sinusitis, unspecified: Secondary | ICD-10-CM

## 2014-01-19 LAB — LIPID PANEL
Cholesterol: 157 mg/dL (ref 0–200)
HDL: 48 mg/dL (ref >39)
LDL CALC: 78 mg/dL (ref 0–99)
TRIGLYCERIDES: 154 mg/dL — AB (ref <150)
Total CHOL/HDL Ratio: 3.3 Ratio
VLDL: 31 mg/dL (ref 0–40)

## 2014-01-19 LAB — GLUCOSE, CAPILLARY: Glucose-Capillary: 251 mg/dL — ABNORMAL HIGH (ref 70–99)

## 2014-01-19 MED ORDER — MELOXICAM 7.5 MG PO TABS: 7.5000 mg | ORAL_TABLET | Freq: Every day | ORAL | Status: DC

## 2014-01-19 NOTE — Telephone Encounter (Signed)
[name] placed call to [employer] to notify that he has limited funds and will need transportation assistance getting to/from appointment today.  CSW informed Michael [name] two bus passes will be available to him utilizing his medicaid medical transportation benefit.  Bus Passes left at front office.

## 2014-01-19 NOTE — Assessment & Plan Note (Addendum)
Got the flu vaccine today.

## 2014-01-19 NOTE — Progress Notes (Signed)
   Subjective:    Patient ID: PERSEUS WESTALL, male    DOB: [birth year], 58 y.o.   MRN: [id number]  HPI Mr. Ke is a 58 yr old man with PMH of HTN, uncontrolled DM2, chronic cervical pain, presenting for follow up for his neck pain and recent ED visit with sinusitis. He states that he finished his Zpack prescribed at the ED and reports that his sinus headache is completely resolved.  He also took oxycodone for the headache and requests refill today.  He has not been to Xray for his neck, continues to have intermittent tingling of the left arm but no weakness, or loss of bowel/bladder control.     Review of Systems  Constitutional: Negative for fever, chills, diaphoresis, activity change, appetite change and fatigue.  HENT: Positive for congestion.   Respiratory: Negative for cough, shortness of breath and wheezing.   Cardiovascular: Negative for chest pain and leg swelling.  Genitourinary: Negative for dysuria.  Musculoskeletal: Positive for arthralgias.       Neck pain  Neurological: Negative for dizziness, weakness and light-headedness.  Psychiatric/Behavioral: Negative for agitation.       Objective:   Physical Exam  Constitutional: He is oriented to person, place, and time. He appears well-developed and well-nourished. No distress.  Cardiovascular: Normal rate.   Pulmonary/Chest: Effort normal. No respiratory distress.  Musculoskeletal: He exhibits no edema.  Strength 5/5 in bilateral UE  Neurological: He is alert and oriented to person, place, and time.  Skin: Skin is warm and dry. He is not diaphoretic.  Psychiatric: He has a normal mood and affect.  Nursing note and vitals reviewed.         Assessment & Plan:

## 2014-01-19 NOTE — Patient Instructions (Addendum)
General Instructions: -Start using saline nasal spray 4 times per day to help your sinuses.  -You have been referred to the pain clinic, they will call you with the appointment.  -You may take Mobic as needed for muscle spasms.   -Follow up in 2 months with Dr. Clinton Gallant.   Thank you for bringing your medicines today. This helps [place] keep you safe from mistakes.   Progress Toward Treatment Goals:  Treatment Goal 08/12/2012  Hemoglobin A1C at goal  Blood pressure improved  Stop smoking -  Prevent falls -    Self Care Goals & Plans:  Self Care Goal 01/01/2014  Manage my medications take my medicines as prescribed; bring my medications to every visit; refill my medications on time; follow the sick day instructions if I am sick  Monitor my health keep track of my blood glucose; bring my glucose meter and log to each visit; check my feet daily  Eat healthy foods eat more vegetables; eat fruit for snacks and desserts; eat baked foods instead of fried foods; eat foods that are low in salt; eat smaller portions; drink diet soda or water instead of juice or soda  Be physically active find an activity I enjoy  Stop smoking -    Home Blood Glucose Monitoring 09/03/2012  Check my blood sugar 4 times a day  When to check my blood sugar -     Care Management & Community Referrals:  Referral 02/03/2013  Referrals made to [facility]; other (see comments)

## 2014-01-19 NOTE — Assessment & Plan Note (Addendum)
Motrin provided relief of muscle spasms.  -Placed a new referral to the pain clinic -Refill Motrin PRN -Pt advised to go get his Xrays.

## 2014-01-19 NOTE — Assessment & Plan Note (Signed)
Was seen in the ED for this. His sinus pain has improved after Zpack and oxycodone.  -Advised pt to use saline nasal spray daily

## 2014-01-19 NOTE — Assessment & Plan Note (Addendum)
Lab Results  Component Value Date   HGBA1C 8.5 12/26/2013   HGBA1C 13.3 08/22/2013   HGBA1C 11.1 05/09/2013     Assessment: Diabetes control:  not controlled  Progress toward A1C goal:   improved Comments: On lantus 65 u at qHS, Novolog 20 units BID, glipizide 10mg  BID, metformin 1000mg  BID  Plan: Medications:  continue current medications Home glucose monitoring: Frequency:   Timing:   Instruction/counseling given: reminded to bring medications to each visit Educational resources provided:   Self management tools provided:   Other plans: Follow up in 2 months with his PCP. Checked Lipids and urine microalbumin/Cr today

## 2014-01-20 LAB — MICROALBUMIN / CREATININE URINE RATIO
Creatinine, Urine: 272.8 mg/dL
Microalb Creat Ratio: 20.9 mg/g (ref 0.0–30.0)
Microalb, Ur: 5.7 mg/dL — ABNORMAL HIGH (ref <2.0)

## 2014-01-20 NOTE — Progress Notes (Signed)
Medicine attending: Medical history, presenting problems, physical findings, and medications, reviewed with Dr Kennerly on the day of the patient encounter and I concur with her evaluation and management plan. 

## 2014-01-22 ENCOUNTER — Other Ambulatory Visit: Payer: Self-pay | Admitting: Internal Medicine

## 2014-01-27 ENCOUNTER — Other Ambulatory Visit: Payer: Self-pay | Admitting: Internal Medicine

## 2014-01-31 ENCOUNTER — Other Ambulatory Visit: Payer: Self-pay | Admitting: Internal Medicine

## 2014-02-06 ENCOUNTER — Telehealth: Payer: Self-pay | Admitting: Licensed Clinical Social Worker

## 2014-02-06 NOTE — Telephone Encounter (Signed)
CSW received denied PCS request on behalf of Michael [name].  CSW placed call to [facility] and notified coordinator, PCS request has been denied.  Mr. Haden does not qualify for PCS request at this time.

## 2014-02-11 ENCOUNTER — Other Ambulatory Visit: Payer: Self-pay | Admitting: *Deleted

## 2014-02-11 DIAGNOSIS — IMO0002 Reserved for concepts with insufficient information to code with codable children: Secondary | ICD-10-CM

## 2014-02-11 DIAGNOSIS — E1165 Type 2 diabetes mellitus with hyperglycemia: Secondary | ICD-10-CM

## 2014-02-11 MED ORDER — GLUCOSE BLOOD VI STRP
ORAL_STRIP | Status: DC
Start: 2014-02-11 — End: 2014-10-08

## 2014-02-11 NOTE — Telephone Encounter (Signed)
Pt is changing pharm, must have a new script

## 2014-02-12 ENCOUNTER — Telehealth: Payer: Self-pay | Admitting: Licensed Clinical Social Worker

## 2014-02-12 NOTE — Telephone Encounter (Signed)
Michael [name] placed call to CSW, left message requesting PCS. [facility] has received prior request from agency, which PCP denied.  Form was faxed to agency, notifying of denial from PCP.  CSW attempted to contact Michael [name] and notify pt of current denial of services.  Pt encouraged pt to schedule appt with PCP with discuss specific needs, as pt has previously stated he was independent with ADL's.  CSW placed called to pt.  CSW left message requesting return call. CSW provided contact hours and phone number.

## 2014-02-23 ENCOUNTER — Ambulatory Visit: Payer: Medicaid Other | Admitting: Internal Medicine

## 2014-03-03 ENCOUNTER — Encounter (HOSPITAL_COMMUNITY): Payer: Self-pay | Admitting: Emergency Medicine

## 2014-03-03 DIAGNOSIS — E119 Type 2 diabetes mellitus without complications: Secondary | ICD-10-CM | POA: Insufficient documentation

## 2014-03-03 DIAGNOSIS — G8929 Other chronic pain: Secondary | ICD-10-CM | POA: Diagnosis not present

## 2014-03-03 DIAGNOSIS — I10 Essential (primary) hypertension: Secondary | ICD-10-CM | POA: Diagnosis not present

## 2014-03-03 DIAGNOSIS — Z720 Tobacco use: Secondary | ICD-10-CM | POA: Insufficient documentation

## 2014-03-03 DIAGNOSIS — Z794 Long term (current) use of insulin: Secondary | ICD-10-CM | POA: Insufficient documentation

## 2014-03-03 DIAGNOSIS — Z791 Long term (current) use of non-steroidal anti-inflammatories (NSAID): Secondary | ICD-10-CM | POA: Insufficient documentation

## 2014-03-03 DIAGNOSIS — Z79899 Other long term (current) drug therapy: Secondary | ICD-10-CM | POA: Diagnosis not present

## 2014-03-03 DIAGNOSIS — F1210 Cannabis abuse, uncomplicated: Secondary | ICD-10-CM | POA: Insufficient documentation

## 2014-03-03 DIAGNOSIS — Z8619 Personal history of other infectious and parasitic diseases: Secondary | ICD-10-CM | POA: Insufficient documentation

## 2014-03-03 DIAGNOSIS — G4700 Insomnia, unspecified: Secondary | ICD-10-CM | POA: Diagnosis not present

## 2014-03-03 DIAGNOSIS — R1010 Upper abdominal pain, unspecified: Secondary | ICD-10-CM | POA: Diagnosis present

## 2014-03-03 DIAGNOSIS — Z86010 Personal history of colonic polyps: Secondary | ICD-10-CM | POA: Diagnosis not present

## 2014-03-03 DIAGNOSIS — R1013 Epigastric pain: Secondary | ICD-10-CM | POA: Diagnosis not present

## 2014-03-03 DIAGNOSIS — F329 Major depressive disorder, single episode, unspecified: Secondary | ICD-10-CM | POA: Diagnosis not present

## 2014-03-03 DIAGNOSIS — Z7982 Long term (current) use of aspirin: Secondary | ICD-10-CM | POA: Diagnosis not present

## 2014-03-03 DIAGNOSIS — K219 Gastro-esophageal reflux disease without esophagitis: Secondary | ICD-10-CM | POA: Insufficient documentation

## 2014-03-03 DIAGNOSIS — R112 Nausea with vomiting, unspecified: Secondary | ICD-10-CM | POA: Insufficient documentation

## 2014-03-03 DIAGNOSIS — G43909 Migraine, unspecified, not intractable, without status migrainosus: Secondary | ICD-10-CM | POA: Insufficient documentation

## 2014-03-03 DIAGNOSIS — F10120 Alcohol abuse with intoxication, uncomplicated: Secondary | ICD-10-CM | POA: Diagnosis not present

## 2014-03-03 LAB — COMPREHENSIVE METABOLIC PANEL
ALK PHOS: 169 U/L — AB (ref 39–117)
ALT: 56 U/L — ABNORMAL HIGH (ref 0–53)
AST: 65 U/L — ABNORMAL HIGH (ref 0–37)
Albumin: 3.8 g/dL (ref 3.5–5.2)
Anion gap: 13 (ref 5–15)
BUN: 6 mg/dL (ref 6–23)
CHLORIDE: 100 mmol/L (ref 96–112)
CO2: 27 mmol/L (ref 19–32)
CREATININE: 0.89 mg/dL (ref 0.50–1.35)
Calcium: 8.7 mg/dL (ref 8.4–10.5)
Glucose, Bld: 267 mg/dL — ABNORMAL HIGH (ref 70–99)
Potassium: 3.8 mmol/L (ref 3.5–5.1)
Sodium: 140 mmol/L (ref 135–145)
TOTAL PROTEIN: 7 g/dL (ref 6.0–8.3)
Total Bilirubin: 0.5 mg/dL (ref 0.3–1.2)

## 2014-03-03 LAB — CBC WITH DIFFERENTIAL/PLATELET
BASOS PCT: 0 % (ref 0–1)
Basophils Absolute: 0 10*3/uL (ref 0.0–0.1)
Eosinophils Absolute: 0.2 10*3/uL (ref 0.0–0.7)
Eosinophils Relative: 3 % (ref 0–5)
HCT: 47 % (ref 39.0–52.0)
HEMOGLOBIN: 16.7 g/dL (ref 13.0–17.0)
Lymphocytes Relative: 34 % (ref 12–46)
Lymphs Abs: 2.3 10*3/uL (ref 0.7–4.0)
MCH: 32.4 pg (ref 26.0–34.0)
MCHC: 35.5 g/dL (ref 30.0–36.0)
MCV: 91.3 fL (ref 78.0–100.0)
Monocytes Absolute: 0.5 10*3/uL (ref 0.1–1.0)
Monocytes Relative: 8 % (ref 3–12)
NEUTROS PCT: 55 % (ref 43–77)
Neutro Abs: 3.8 10*3/uL (ref 1.7–7.7)
Platelets: 129 10*3/uL — ABNORMAL LOW (ref 150–400)
RBC: 5.15 MIL/uL (ref 4.22–5.81)
RDW: 14.9 % (ref 11.5–15.5)
WBC: 6.8 10*3/uL (ref 4.0–10.5)

## 2014-03-03 LAB — LIPASE, BLOOD: LIPASE: 37 U/L (ref 11–59)

## 2014-03-03 NOTE — ED Notes (Signed)
Pt. reports generalized abdominal pain with nausea and vomitting onset last night . ETOH intake this evening prior to arrival . Denies fever or chills.

## 2014-03-04 ENCOUNTER — Emergency Department (HOSPITAL_COMMUNITY)
Admission: EM | Admit: 2014-03-04 | Discharge: 2014-03-04 | Disposition: A | Discharge status: Home / Self Care | Payer: Medicaid Other | Attending: Emergency Medicine | Admitting: Emergency Medicine

## 2014-03-04 DIAGNOSIS — R1013 Epigastric pain: Secondary | ICD-10-CM

## 2014-03-04 DIAGNOSIS — F10929 Alcohol use, unspecified with intoxication, unspecified: Secondary | ICD-10-CM

## 2014-03-04 LAB — URINALYSIS, ROUTINE W REFLEX MICROSCOPIC
Bilirubin Urine: NEGATIVE
Glucose, UA: >1000 mg/dL — AB
HGB URINE DIPSTICK: NEGATIVE
KETONES UR: 15 mg/dL — AB
LEUKOCYTES UA: NEGATIVE
Nitrite: NEGATIVE
PROTEIN: 30 mg/dL — AB
Specific Gravity, Urine: 1.03 (ref 1.005–1.030)
Urobilinogen, UA: 1 mg/dL (ref 0.0–1.0)
pH: 6 (ref 5.0–8.0)

## 2014-03-04 LAB — RAPID URINE DRUG SCREEN, HOSP PERFORMED
Amphetamines: NOT DETECTED
Barbiturates: NOT DETECTED
Benzodiazepines: NOT DETECTED
COCAINE: NOT DETECTED
Opiates: NOT DETECTED
Tetrahydrocannabinol: POSITIVE — AB

## 2014-03-04 LAB — URINE MICROSCOPIC-ADD ON

## 2014-03-04 LAB — ETHANOL: ALCOHOL ETHYL (B): 202 mg/dL — AB (ref 0–9)

## 2014-03-04 MED ORDER — DIPHENHYDRAMINE HCL 50 MG/ML IJ SOLN
INTRAMUSCULAR | Status: AC
  Filled 2014-03-04: qty 1

## 2014-03-04 MED ORDER — FAMOTIDINE IN NACL 20-0.9 MG/50ML-% IV SOLN
INTRAVENOUS | Status: AC
  Filled 2014-03-04: qty 50

## 2014-03-04 MED ORDER — METOCLOPRAMIDE HCL 5 MG/ML IJ SOLN: 10.0000 mg | Freq: Once | INTRAMUSCULAR | Status: DC

## 2014-03-04 MED ORDER — FAMOTIDINE IN NACL 20-0.9 MG/50ML-% IV SOLN: 20.0000 mg | Freq: Once | INTRAVENOUS | Status: DC

## 2014-03-04 MED ORDER — SODIUM CHLORIDE 0.9 % IV SOLN: 1000.0000 mL | Freq: Once | INTRAVENOUS | Status: DC

## 2014-03-04 MED ORDER — METOCLOPRAMIDE HCL 5 MG/ML IJ SOLN
INTRAMUSCULAR | Status: DC
Start: 2014-03-04 — End: 2014-03-04
  Filled 2014-03-04: qty 2

## 2014-03-04 MED ORDER — DIPHENHYDRAMINE HCL 50 MG/ML IJ SOLN: 25.0000 mg | Freq: Once | INTRAMUSCULAR | Status: DC

## 2014-03-04 MED ORDER — ONDANSETRON HCL 4 MG PO TABS: 4.0000 mg | ORAL_TABLET | Freq: Four times a day (QID) | ORAL | Status: DC

## 2014-03-04 MED ORDER — SODIUM CHLORIDE 0.9 % IV SOLN: 1000.0000 mL | INTRAVENOUS | Status: DC

## 2014-03-04 NOTE — ED Notes (Signed)
The pt reports that  His pain is better

## 2014-03-04 NOTE — ED Provider Notes (Signed)
CSN: [id number]     Arrival date & time 03/03/14  2248 History  This chart was scribed for Janice Norrie, MD by Rayfield Citizen, ED Scribe. This patient was seen in room A02C/A02C and the patient's care was started at 12:16 AM.    Chief Complaint  Patient presents with  . Abdominal Pain   Patient is a 59 y.o. male presenting with abdominal pain. The history is provided by the patient. No language interpreter was used.  Abdominal Pain Associated symptoms: nausea and vomiting      HPI Comments: WRIGHT GRAVELY is a 59 y.o. male with past medical history of HLD, DM (managed with Novolog and Lantus), pancreatitis, gastritis due to alcohol (without hemorrhage), helicobacter pylori gastritis, GERD, heavy EtOH use who presents to the Emergency Department complaining of 3-4 days of "aching, pressure-like" upper abdominal pain with nausea and vomiting (2-3x for the past two days, 1x today). He notes a long term history of chronic abdominal pain. He reports EtOH use tonight PTA in an attempt to "numb" his abdominal pain. He denies diarrhea, fevers.   He also is complaining of neck pain which is a chronic problem.  He denies any new injury or change. Patient states he has been waiting to get referred to a pain clinic on [address] but it has not happened yet.  PCP is Clinton Gallant, MD.    Past Medical History  Diagnosis Date  . Chronic pancreatitis   . Smoker   . Gastritis due to alcohol without hemorrhage   . HTN (hypertension)   . Pancreatic lesion     appears innocent and related to chronic pancreatitis  . Heavy alcohol use     hx of  . Marijuana use     hx of  . Migraine   . Depression   . Helicobacter pylori gastritis 03/27/2011    on endoscopy, treated with omeprazole, clarithromycin and amoxicillin  . Insomnia   . ACE inhibitor-aggravated angioedema   . Sessile colonic polyp   . Diabetes mellitus   . GERD (gastroesophageal reflux disease)   . Chronic abdominal pain on narcotics 06/27/2012    Past Surgical History  Procedure Laterality Date  . Esophagogastroduodenoscopy  03/22/2011    Procedure: ESOPHAGOGASTRODUODENOSCOPY (EGD);  Surgeon: Gatha Mayer, MD;  Location: [facility];  Service: Endoscopy;  Laterality: N/A;  egd with balloon   . Balloon dilation  03/22/2011    Procedure: BALLOON DILATION;  Surgeon: Gatha Mayer, MD;  Location: [facility];  Service: Endoscopy;  Laterality: N/A;  . Eus  04/20/2011    Procedure: UPPER ENDOSCOPIC ULTRASOUND (EUS) LINEAR;  Surgeon: Milus Banister, MD;  Location: [facility];  Service: Endoscopy;  Laterality: N/A;  . Colonoscopy w/ biopsies and polypectomy  09/12/11  . Colonoscopy N/A 05/22/2012    Procedure: COLONOSCOPY;  Surgeon: Gatha Mayer, MD;  Location: [facility];  Service: Endoscopy;  Laterality: N/A;   Family History  Problem Relation Age of Onset  . Colon cancer Neg Hx   . Stomach cancer Neg Hx   . Anesthesia problems Neg Hx   . Hypotension Neg Hx   . Pseudochol deficiency Neg Hx   . Malignant hyperthermia Neg Hx   . Hypertension Sister   . Diabetes Sister   . Heart disease Sister    History  Substance Use Topics  . Smoking status: Current Every Day Smoker -- 0.60 packs/day for 30 years    Types: Cigarettes  . Smokeless tobacco: Never Used  Comment: 4 -5 cigs/day.Wants to quit. Up to 7-8 cigs per day  . Alcohol Use: No     Comment: sobriety since 2/13,was drinking 7-10 beer/daily  He smokes about .5ppd.  He drinks "a couple" cans of beer when he drinks; he is attempting to cut back his drinking and "does not drink every day."  He uses marijuana occasionally.  He is on disability (pancreatitis, HLD, DM).   Review of Systems  Gastrointestinal: Positive for nausea, vomiting and abdominal pain.  All other systems reviewed and are negative.   Allergies  Ace inhibitors and Losartan  Home Medications   Prior to Admission medications   Medication Sig Start Date End Date Taking? Authorizing Provider   ACCU-CHEK AVIVA PLUS test strip check blood sugar THREE TIMES DAILY BEFORE MEALS AND AT BEDTIME 12/16/13   Clinton Gallant, MD  ARIPiprazole (ABILIFY) 15 MG tablet Take 15 mg by mouth at bedtime. Takes with 5 mg and 2 mg for a 22 mg dose    Historical Provider, MD  ARIPiprazole (ABILIFY) 2 MG tablet Take 2 mg by mouth at bedtime. Takes with 15 mg and 5 mg for a 22 mg dose    Historical Provider, MD  ARIPiprazole (ABILIFY) 5 MG tablet Take 5 mg by mouth at bedtime. Takes with 15 mg and 2 mg for a 22 mg dose    Historical Provider, MD  aspirin EC 81 MG tablet Take 81 mg by mouth at bedtime.    Historical Provider, MD  azithromycin (ZITHROMAX Z-PAK) 250 MG tablet 2 po day one, then 1 daily x 4 days 01/13/14   Veryl Speak, MD  B-D UF III MINI PEN NEEDLES 31G X 5 MM MISC USE TO INJECT insulin THREE TIMES DAILY 01/27/14   Clinton Gallant, MD  Blood Glucose Monitoring Suppl (ACCU-CHEK NANO SMARTVIEW) W/DEVICE KIT 1 each by Does not apply route 3 (three) times daily. Dx code 250.00 insulin requiring 10/07/12   Dominic Pea, DO  FLUoxetine (PROZAC) 20 MG capsule Take 20 mg by mouth 2 (two) times daily.     Historical Provider, MD  glipiZIDE (GLUCOTROL) 10 MG tablet Take 10 mg by mouth 2 (two) times daily before a meal. 07/22/12   Clinton Gallant, MD  glucose blood (ACCU-CHEK SMARTVIEW) test strip Check blood sugar 3 times a day before meals and bedtime Dx code E11.65 insulin requiring 02/11/14   Clinton Gallant, MD  glucose blood (AGAMATRIX PRESTO TEST) test strip Use as instructed 12/11/13   Clinton Gallant, MD  hydrochlorothiazide (HYDRODIURIL) 25 MG tablet Take 1 tablet (25 mg total) by mouth daily. 08/22/13   Clinton Gallant, MD  HYDROcodone-acetaminophen (NORCO) 5-325 MG per tablet Take 2 tablets by mouth every 4 (four) hours as needed. 01/13/14   Veryl Speak, MD  hydrOXYzine (ATARAX/VISTARIL) 10 MG tablet Take 10 mg by mouth daily as needed for itching.     Historical Provider, MD  insulin aspart (NOVOLOG) 100 UNIT/ML injection  Inject 20 Units into the skin 2 (two) times daily.    Historical Provider, MD  Insulin Glargine (LANTUS SOLOSTAR) 100 UNIT/ML Solostar Pen Inject 0.65 mLs (65 Units total) into the skin at bedtime 12/26/13   Clinton Gallant, MD  lovastatin (MEVACOR) 40 MG tablet Take 1 tablet (40 mg total) by mouth at bedtime. 02/07/13   Clinton Gallant, MD  meloxicam (MOBIC) 7.5 MG tablet Take 1 tablet (7.5 mg total) by mouth daily. 01/19/14   Blain Pais, MD  metFORMIN (GLUCOPHAGE) 500 MG tablet Take 1,000 mg  by mouth 2 (two) times daily with a meal.    Historical Provider, MD  Pancrelipase, Lip-Prot-Amyl, (CREON) 36000 UNITS CPEP Take 36,000 Units by mouth 3 (three) times daily.    Historical Provider, MD  pantoprazole (PROTONIX) 40 MG tablet Take 1 tablet (40 mg total) by mouth daily. 02/07/13   Clinton Gallant, MD  promethazine (PHENERGAN) 25 MG tablet TAKE 1 TABLET BY MOUTH EVERY 6 HOURS AS NEEDED FOR NAUSEA 02/02/14   Clinton Gallant, MD  Vitamin D, Ergocalciferol, (DRISDOL) 50000 UNITS CAPS capsule Take 50,000 Units by mouth every 7 (seven) days. Every Sunday    Historical Provider, MD   BP 137/83 mmHg  Pulse 86  Temp(Src) 99.6 F (37.6 C) (Oral)  Resp 20  SpO2 100%  Vital signs normal   Physical Exam  Constitutional: He is oriented to person, place, and time. He appears well-developed and well-nourished.  Non-toxic appearance. He does not appear ill. No distress.  Patient ambulatory from the bathroom in no distress.  HENT:  Head: Normocephalic and atraumatic.  Right Ear: External ear normal.  Left Ear: External ear normal.  Nose: Nose normal. No mucosal edema or rhinorrhea.  Mouth/Throat: Oropharynx is clear and moist and mucous membranes are normal. No dental abscesses or uvula swelling.  Eyes: Conjunctivae and EOM are normal. Pupils are equal, round, and reactive to light.  Neck: Normal range of motion and full passive range of motion without pain. Neck supple.  Cardiovascular: Normal rate, regular rhythm  and normal heart sounds.  Exam reveals no gallop and no friction rub.   No murmur heard. Pulmonary/Chest: Effort normal and breath sounds normal. No respiratory distress. He has no wheezes. He has no rhonchi. He has no rales. He exhibits no tenderness and no crepitus.  Abdominal: Soft. Normal appearance and bowel sounds are normal. He exhibits no distension. There is tenderness (Epigastric). There is no rebound and no guarding.  Musculoskeletal: Normal range of motion. He exhibits no edema or tenderness.  Moves all extremities well.   Neurological: He is alert and oriented to person, place, and time. He has normal strength. No cranial nerve deficit.  Skin: Skin is warm, dry and intact. No rash noted. No erythema. No pallor.  Psychiatric: He has a normal mood and affect. His speech is normal and behavior is normal. His mood appears not anxious.  Nursing note and vitals reviewed.   ED Course  Procedures   Medications  0.9 %  sodium chloride infusion (not administered)    Followed by  0.9 %  sodium chloride infusion (not administered)    Followed by  0.9 %  sodium chloride infusion (not administered)  metoCLOPramide (REGLAN) injection 10 mg (not administered)  diphenhydrAMINE (BENADRYL) injection 25 mg (not administered)  famotidine (PEPCID) IVPB 20 mg (not administered)  diphenhydrAMINE (BENADRYL) 50 MG/ML injection (not administered)  metoCLOPramide (REGLAN) 5 MG/ML injection (not administered)  famotidine (PEPCID) 20-0.9 MG/50ML-% IVPB (not administered)     DIAGNOSTIC STUDIES: Oxygen Saturation is 96% on RA, adequate by my interpretation.    COORDINATION OF CARE: 12:24 AM Discussed treatment plan with pt at bedside and pt agreed to plan.  Patient slept his whole ED visit. He was awakened at time of discharge and states he feels improved. He feels ready to be discharged.  Labs Review Results for orders placed or performed during the hospital encounter of 03/04/14  CBC with  Differential  Result Value Ref Range   WBC 6.8 4.0 - 10.5 K/uL   RBC 5.15  4.22 - 5.81 MIL/uL   Hemoglobin 16.7 13.0 - 17.0 g/dL   HCT 47.0 39.0 - 52.0 %   MCV 91.3 78.0 - 100.0 fL   MCH 32.4 26.0 - 34.0 pg   MCHC 35.5 30.0 - 36.0 g/dL   RDW 14.9 11.5 - 15.5 %   Platelets 129 (L) 150 - 400 K/uL   Neutrophils Relative % 55 43 - 77 %   Neutro Abs 3.8 1.7 - 7.7 K/uL   Lymphocytes Relative 34 12 - 46 %   Lymphs Abs 2.3 0.7 - 4.0 K/uL   Monocytes Relative 8 3 - 12 %   Monocytes Absolute 0.5 0.1 - 1.0 K/uL   Eosinophils Relative 3 0 - 5 %   Eosinophils Absolute 0.2 0.0 - 0.7 K/uL   Basophils Relative 0 0 - 1 %   Basophils Absolute 0.0 0.0 - 0.1 K/uL  Comprehensive metabolic panel  Result Value Ref Range   Sodium 140 135 - 145 mmol/L   Potassium 3.8 3.5 - 5.1 mmol/L   Chloride 100 96 - 112 mmol/L   CO2 27 19 - 32 mmol/L   Glucose, Bld 267 (H) 70 - 99 mg/dL   BUN 6 6 - 23 mg/dL   Creatinine, Ser 0.89 0.50 - 1.35 mg/dL   Calcium 8.7 8.4 - 10.5 mg/dL   Total Protein 7.0 6.0 - 8.3 g/dL   Albumin 3.8 3.5 - 5.2 g/dL   AST 65 (H) 0 - 37 U/L   ALT 56 (H) 0 - 53 U/L   Alkaline Phosphatase 169 (H) 39 - 117 U/L   Total Bilirubin 0.5 0.3 - 1.2 mg/dL   GFR calc non Af Amer >90 >90 mL/min   GFR calc Af Amer >90 >90 mL/min   Anion gap 13 5 - 15  Lipase, blood  Result Value Ref Range   Lipase 37 11 - 59 U/L  Ethanol  Result Value Ref Range   Alcohol, Ethyl (B) 202 (H) 0 - 9 mg/dL  Urine rapid drug screen (hosp performed)  Result Value Ref Range   Opiates NONE DETECTED NONE DETECTED   Cocaine NONE DETECTED NONE DETECTED   Benzodiazepines NONE DETECTED NONE DETECTED   Amphetamines NONE DETECTED NONE DETECTED   Tetrahydrocannabinol POSITIVE (A) NONE DETECTED   Barbiturates NONE DETECTED NONE DETECTED  Urinalysis, Routine w reflex microscopic  Result Value Ref Range   Color, Urine YELLOW YELLOW   APPearance CLEAR CLEAR   Specific Gravity, Urine 1.030 1.005 - 1.030   pH 6.0 5.0 - 8.0    Glucose, UA >1000 (A) NEGATIVE mg/dL   Hgb urine dipstick NEGATIVE NEGATIVE   Bilirubin Urine NEGATIVE NEGATIVE   Ketones, ur 15 (A) NEGATIVE mg/dL   Protein, ur 30 (A) NEGATIVE mg/dL   Urobilinogen, UA 1.0 0.0 - 1.0 mg/dL   Nitrite NEGATIVE NEGATIVE   Leukocytes, UA NEGATIVE NEGATIVE  Urine microscopic-add on  Result Value Ref Range   Squamous Epithelial / LPF RARE RARE   WBC, UA 0-2 <3 WBC/hpf   RBC / HPF 0-2 <3 RBC/hpf   Bacteria, UA RARE RARE    Laboratory interpretation all normal except for alcohol intoxication, positive UDS   Imaging Review No results found.   EKG Interpretation None      MDM   Final diagnoses:  Alcohol intoxication, with unspecified complication  Epigastric abdominal pain    New Prescriptions   ONDANSETRON (ZOFRAN) 4 MG TABLET    Take 1 tablet (4 mg total) by mouth every  6 (six) hours.    Plan discharge  Rolland Porter, MD, FACEP   I personally performed the services described in this documentation, which was scribed in my presence. The recorded information has been reviewed and considered.  Rolland Porter, MD, FACEP      Janice Norrie, MD 03/04/14 [phone]

## 2014-03-04 NOTE — Discharge Instructions (Signed)
YOU NEED TO STOP DRINKING ALCOHOL AND THAT INCLUDES BEER!!!  Use the zofran for nausea or vomiting. You can take maalox if your pain returns.   Follow up as needed with your doctor.

## 2014-03-04 NOTE — ED Notes (Signed)
2 liters of nss infused

## 2014-03-04 NOTE — ED Notes (Signed)
Pt c/o being hot he has blankets and a heavy coat across him.   Door opened to let in the draft

## 2014-03-20 ENCOUNTER — Encounter: Payer: Self-pay | Admitting: Internal Medicine

## 2014-03-20 ENCOUNTER — Ambulatory Visit (INDEPENDENT_AMBULATORY_CARE_PROVIDER_SITE_OTHER): Payer: Medicaid Other | Admitting: Internal Medicine

## 2014-03-20 VITALS — BP 148/96 | HR 88 | Temp 98.4°F | Ht 64.0 in | Wt 181.5 lb

## 2014-03-20 DIAGNOSIS — E785 Hyperlipidemia, unspecified: Secondary | ICD-10-CM

## 2014-03-20 DIAGNOSIS — IMO0002 Reserved for concepts with insufficient information to code with codable children: Secondary | ICD-10-CM

## 2014-03-20 DIAGNOSIS — E1165 Type 2 diabetes mellitus with hyperglycemia: Secondary | ICD-10-CM

## 2014-03-20 DIAGNOSIS — I10 Essential (primary) hypertension: Secondary | ICD-10-CM

## 2014-03-20 DIAGNOSIS — M542 Cervicalgia: Secondary | ICD-10-CM

## 2014-03-20 LAB — GLUCOSE, CAPILLARY: Glucose-Capillary: 219 mg/dL — ABNORMAL HIGH (ref 70–99)

## 2014-03-20 LAB — POCT GLYCOSYLATED HEMOGLOBIN (HGB A1C): Hemoglobin A1C: 8.5

## 2014-03-20 MED ORDER — LOVASTATIN 40 MG PO TABS: 40.0000 mg | ORAL_TABLET | Freq: Every day | ORAL | Status: DC

## 2014-03-20 MED ORDER — MELOXICAM 7.5 MG PO TABS: 7.5000 mg | ORAL_TABLET | Freq: Every day | ORAL | Status: DC

## 2014-03-20 MED ORDER — HYDROCHLOROTHIAZIDE 25 MG PO TABS: 25.0000 mg | ORAL_TABLET | Freq: Every day | ORAL | Status: DC

## 2014-03-20 MED ORDER — PROMETHAZINE HCL 25 MG PO TABS: 25.0000 mg | ORAL_TABLET | Freq: Four times a day (QID) | ORAL | Status: DC | PRN

## 2014-03-20 MED ORDER — INSULIN GLARGINE 100 UNIT/ML SOLOSTAR PEN: PEN_INJECTOR | SUBCUTANEOUS | Status: DC

## 2014-03-20 NOTE — Patient Instructions (Signed)
General Instructions:   Thank you for bringing your medicines today. This helps [place] keep you safe from mistakes.   Progress Toward Treatment Goals:  Treatment Goal 08/12/2012  Hemoglobin A1C at goal  Blood pressure improved  Stop smoking -  Prevent falls -    Self Care Goals & Plans:  Self Care Goal 03/20/2014  Manage my medications take my medicines as prescribed; bring my medications to every visit; refill my medications on time  Monitor my health keep track of my blood glucose; bring my glucose meter and log to each visit  Eat healthy foods drink diet soda or water instead of juice or soda; eat more vegetables; eat foods that are low in salt; eat baked foods instead of fried foods  Be physically active -  Stop smoking -    Home Blood Glucose Monitoring 09/03/2012  Check my blood sugar 4 times a day  When to check my blood sugar -     Care Management & Community Referrals:  Referral 02/03/2013  Referrals made to [facility]; other (see comments)

## 2014-03-20 NOTE — Assessment & Plan Note (Signed)
BP Readings from Last 3 Encounters:  03/20/14 148/96  03/04/14 137/83  01/19/14 134/95    Lab Results  Component Value Date   NA 140 03/03/2014   K 3.8 03/03/2014   CREATININE 0.89 03/03/2014    Assessment: Blood pressure control:   Progress toward BP goal:    Comments: pt needs refills  Plan: Medications:  Continue hydrochlorothiazide 25 mg daily and lovastatin also renewed Educational resources provided: brochure (denies) Self management tools provided:   Other plans: Follow-up at next visit

## 2014-03-20 NOTE — Assessment & Plan Note (Signed)
Lab Results  Component Value Date   HGBA1C 8.5 03/20/2014   HGBA1C 8.5 12/26/2013   HGBA1C 13.3 08/22/2013    Wt Readings from Last 3 Encounters:  03/20/14 181 lb 8 oz (82.328 kg)  01/19/14 189 lb 3.2 oz (85.821 kg)  01/13/14 196 lb (88.905 kg)     Assessment: Diabetes control:   Progress toward A1C goal:    Comments: achieving better control patient did not want discuss today but feels that he is doing "much better on this insulin."  Plan: Medications:  Continue Lantus 65 units daily at bedtime, NovoLog 20 units twice a day, glipizide 10 mg twice a day, and metformin 1000 g twice a day Home glucose monitoring: Frequency:   Timing:   Instruction/counseling given: reminded to get eye exam, reminded to bring medications to each visit and discussed diet Educational resources provided: brochure (denies) Self management tools provided:   Other plans: follow up in 1-3 mon

## 2014-03-20 NOTE — Progress Notes (Signed)
Subjective:   Patient ID: Michael [name] male   DOB: 1955-06-04 59 y.o.   MRN: [id number]  HPI: [name] is a 59 y.o. man pmh as listed below presents for pain medication discussion.   Pt states that he would like pain medication for his chronic issues and ongoing neck pain. Images done 12/28./16 visit were reviewed with the patient that showed some luminal narrowing 2/2 spurring. Pt has had no new symptoms, weakness, or worsening radiculopathy. He has previously had pain contract with this clinic and violated the contract and then had a pain contract with GI for chronic pancreatitis and was also dismissed from that group and then referred to [facility] pain clinic and further dismissed from there all of this was reviewed with the patient and also the fact that he has starting using alcohol and other substances (previous and recent ED presentations) that this is a hindrance to him being approved by any other provider for long term management.     Past Medical History  Diagnosis Date  . Chronic pancreatitis   . Smoker   . Gastritis due to alcohol without hemorrhage   . HTN (hypertension)   . Pancreatic lesion     appears innocent and related to chronic pancreatitis  . Heavy alcohol use     hx of  . Marijuana use     hx of  . Migraine   . Depression   . Helicobacter pylori gastritis 03/27/2011    on endoscopy, treated with omeprazole, clarithromycin and amoxicillin  . Insomnia   . ACE inhibitor-aggravated angioedema   . Sessile colonic polyp   . Diabetes mellitus   . GERD (gastroesophageal reflux disease)   . Chronic abdominal pain on narcotics 06/27/2012   Current Outpatient Prescriptions  Medication Sig Dispense Refill  . ACCU-CHEK AVIVA PLUS test strip check blood sugar THREE TIMES DAILY BEFORE MEALS AND AT BEDTIME 100 each 12  . ARIPiprazole (ABILIFY) 15 MG tablet Take 15 mg by mouth at bedtime. Takes with 5 mg and 2 mg for a 22 mg dose    . ARIPiprazole (ABILIFY) 2 MG  tablet Take 2 mg by mouth at bedtime. Takes with 15 mg and 5 mg for a 22 mg dose    . ARIPiprazole (ABILIFY) 5 MG tablet Take 5 mg by mouth at bedtime. Takes with 15 mg and 2 mg for a 22 mg dose    . aspirin EC 81 MG tablet Take 81 mg by mouth at bedtime.    . B-D UF III MINI PEN NEEDLES 31G X 5 MM MISC USE TO INJECT insulin THREE TIMES DAILY 100 each 1  . Blood Glucose Monitoring Suppl (ACCU-CHEK NANO SMARTVIEW) W/DEVICE KIT 1 each by Does not apply route 3 (three) times daily. Dx code 250.00 insulin requiring 1 kit 0  . FLUoxetine (PROZAC) 20 MG capsule Take 20 mg by mouth 2 (two) times daily.     Marland Kitchen glipiZIDE (GLUCOTROL) 10 MG tablet Take 10 mg by mouth 2 (two) times daily before a meal.    . glucose blood (ACCU-CHEK SMARTVIEW) test strip Check blood sugar 3 times a day before meals and bedtime Dx code E11.65 insulin requiring 100 each 12  . glucose blood (AGAMATRIX PRESTO TEST) test strip Use as instructed 100 each 12  . hydrochlorothiazide (HYDRODIURIL) 25 MG tablet Take 1 tablet (25 mg total) by mouth daily. 90 tablet 4  . insulin aspart (NOVOLOG) 100 UNIT/ML injection Inject 20 Units into the skin 2 (  two) times daily.    . Insulin Glargine (LANTUS SOLOSTAR) 100 UNIT/ML Solostar Pen Inject 0.65 mLs (65 Units total) into the skin at bedtime 30 mL 11  . lovastatin (MEVACOR) 40 MG tablet Take 1 tablet (40 mg total) by mouth at bedtime. 30 tablet 2  . meloxicam (MOBIC) 7.5 MG tablet Take 1 tablet (7.5 mg total) by mouth daily. 30 tablet 0  . metFORMIN (GLUCOPHAGE) 500 MG tablet Take 1,000 mg by mouth 2 (two) times daily with a meal.    . ondansetron (ZOFRAN) 4 MG tablet Take 1 tablet (4 mg total) by mouth every 6 (six) hours. 6 tablet 0  . Pancrelipase, Lip-Prot-Amyl, (CREON) 36000 UNITS CPEP Take 36,000 Units by mouth 3 (three) times daily.    . pantoprazole (PROTONIX) 40 MG tablet Take 1 tablet (40 mg total) by mouth daily. 90 tablet 0  . promethazine (PHENERGAN) 25 MG tablet Take 1 tablet (25  mg total) by mouth every 6 (six) hours as needed. for nausea 15 tablet 0  . Vitamin D, Ergocalciferol, (DRISDOL) 50000 UNITS CAPS capsule Take 50,000 Units by mouth every 7 (seven) days. Every Sunday     No current facility-administered medications for this visit.   Family History  Problem Relation Age of Onset  . Colon cancer Neg Hx   . Stomach cancer Neg Hx   . Anesthesia problems Neg Hx   . Hypotension Neg Hx   . Pseudochol deficiency Neg Hx   . Malignant hyperthermia Neg Hx   . Hypertension Sister   . Diabetes Sister   . Heart disease Sister    History   Social History  . Marital Status: Single    Spouse Name: N/A  . Number of Children: 0  . Years of Education: 11   Occupational History  .    [employer]    worked for 6 years  . 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 Smoking status: Current Every Day Smoker -- 0.60 packs/day for 30 years    Types: Cigarettes  . Smokeless tobacco: Never Used     Comment: 4 -5 cigs/day.Wants to quit. Up to 7-8 cigs per day  . Alcohol Use: No     Comment: sobriety since 2/13,was drinking 7-10 beer/daily  . Drug Use: Not on file     Comment: Feb. 2013 last use  . Sexual Activity: Not on file   Other Topics Concern  . None   Social History Narrative   Has been living at 3 different friend's homes since discharge 01/2011. Was living in the shelter prior to admission. Had previously lived with his niece and other family members but he has gotten kicked out each time.    Review of Systems: Pertinent items are noted in HPI. Objective:  Physical Exam: Filed Vitals:   03/20/14 1015  BP: 148/96  Pulse: 88  Temp: 98.4 F (36.9 C)  TempSrc: Oral  Height: 5' 4"  (1.626 m)  Weight: 181 lb 8 oz (82.328 kg)  SpO2: 100%   General:sitting in chair, NAD HEENT: PERRL, EOMI, no scleral icterus Cardiac: RRR, no rubs, murmurs or gallops Pulm: clear to auscultation  bilaterally, moving normal volumes of air Abd: soft, nontender, nondistended, BS present Ext: warm and well perfused, no pedal edema Neuro: alert and oriented X3, cranial nerves II-XII grossly intact  Assessment & Plan:  Please see problem oriented charting  Pt discussed  with Dr. Lynnae January

## 2014-03-20 NOTE — Assessment & Plan Note (Signed)
Patient does have some luminal narrowing and vertebral spurring that is probably etiology for patient's symptoms. Has no red flag symptoms today. Given the patient's violation of several pain centers no other controlled substances are to be given.  -pt interested in sports medicine for possible steroid injections referral made -Mobic was helpful that was given last visit and most recent bmet showed good renal function. therefore refill provided.    Chemistry      Component Value Date/Time   NA 140 03/03/2014 2301   K 3.8 03/03/2014 2301   CL 100 03/03/2014 2301   CO2 27 03/03/2014 2301   BUN 6 03/03/2014 2301   CREATININE 0.89 03/03/2014 2301   CREATININE 0.61 08/02/2011 1552      Component Value Date/Time   CALCIUM 8.7 03/03/2014 2301   ALKPHOS 169* 03/03/2014 2301   AST 65* 03/03/2014 2301   ALT 56* 03/03/2014 2301   BILITOT 0.5 03/03/2014 2301

## 2014-03-22 ENCOUNTER — Other Ambulatory Visit: Payer: Self-pay | Admitting: Internal Medicine

## 2014-03-22 NOTE — Progress Notes (Signed)
Internal Medicine Clinic Attending  Case discussed with Dr. Sadek soon after the resident saw the patient.  We reviewed the resident's history and exam and pertinent patient test results.  I agree with the assessment, diagnosis, and plan of care documented in the resident's note. 

## 2014-04-02 NOTE — Addendum Note (Signed)
Addended by: Hulan Fray on: 04/02/2014 07:46 AM   Modules accepted: Orders

## 2014-04-13 ENCOUNTER — Other Ambulatory Visit: Payer: Self-pay | Admitting: Internal Medicine

## 2014-04-14 ENCOUNTER — Telehealth: Payer: Self-pay | Admitting: *Deleted

## 2014-04-14 ENCOUNTER — Telehealth: Payer: Self-pay | Admitting: Licensed Clinical Social Worker

## 2014-04-14 NOTE — Telephone Encounter (Signed)
Called in reference to shanag. Note, a lady stating she is his sister answered and stated she would give him the message to rtc

## 2014-04-14 NOTE — Telephone Encounter (Signed)
Michael [name] placed call to this CSW to provide an update on his current social issues.   Food:  Michael [name] food stamps have been cut down to $16/month.  After paying utilities, rent and meat, Michael [name] states he has nothing left.  Michael [name] plans to meet with his DSS worker to discuss request an increase.  CSW encouraged Michael [name] to contact [place].  Michael [name] states he is familiar with them and will. Transportation: Transportation remains an issue.  Michael [name] no longer participates in program where he would get extra bus passes, but states he may have to return.  CSW encouraged Michael [name] to request bus passes for [place] from [place].  Michael [name] notified this worker can not provide Medicaid bus passes for [facility] due to being unable to verify appointment.  Psychiatric:  Michael [name] states he has an upcoming appointment at [facility].  He has been assigned a psychiatrist but has difficulty understanding him because of his accent, he may request another.  Michael [name] complains that his Abilify works but makes him too sleepy.  He states he takes it at night and is still sleepy in the morning.  Michael [name] states the dose has not changed, but he attributes it to combining with his other medications.  Michael [name] to discuss with his Psychiatrist at [facility]. Medical:  Michael [name] complains Mobic hurts his stomach and prefers Ibuprofen (message sent to PCP and triage) Michael [name] denies add'l social work needs at this time.

## 2014-04-20 ENCOUNTER — Telehealth: Payer: Self-pay | Admitting: *Deleted

## 2014-04-20 NOTE — Telephone Encounter (Signed)
Call from patient stating that he needs a [place].  Will send message to Dr. Algis Liming about his request.  Sander Nephew, RN 04/20/2014 1:31 PM

## 2014-04-28 NOTE — Telephone Encounter (Signed)
Mr. Kope does not demonstrate any immediate need for [facility] services at this time. Therefore referral is not appropriate unless patient would like to pursue on own accord. Thanks.

## 2014-05-12 ENCOUNTER — Telehealth: Payer: Self-pay | Admitting: Licensed Clinical Social Worker

## 2014-05-12 NOTE — Telephone Encounter (Signed)
[name] placed call to CSW to provide current update that he is in a new skills program during the week.  In addition, pt inquiring about his "nurse assistance".  CSW informed [name], this worker unaware of any [facility] RN referral and most likely would not qualify as he is not homebound.  Notified [name] that he does not qualify for PCS because he is independent with ADL's.  [name] states he thought PCS nurse would be able to help his neck pain.  CSW informed [name] PCS is for hands on assistance with ADL's.  [name] requesting the phone number to Dr. Alyson Ingles on [facility].  CSW offered to forward referral request to this physician, pt declined stating he could call and get appointment himself.  CSW provided [name] with the phone number to Dr. Ricke Hey per pt's request.  Pt denies add'l social work needs at this time.

## 2014-05-13 ENCOUNTER — Other Ambulatory Visit: Payer: Self-pay | Admitting: *Deleted

## 2014-05-13 DIAGNOSIS — I10 Essential (primary) hypertension: Secondary | ICD-10-CM

## 2014-05-13 DIAGNOSIS — M542 Cervicalgia: Secondary | ICD-10-CM

## 2014-05-14 MED ORDER — ONDANSETRON HCL 4 MG PO TABS: 4.0000 mg | ORAL_TABLET | Freq: Four times a day (QID) | ORAL | Status: DC

## 2014-05-14 MED ORDER — MELOXICAM 7.5 MG PO TABS: 7.5000 mg | ORAL_TABLET | Freq: Every day | ORAL | Status: DC

## 2014-05-14 MED ORDER — HYDROCHLOROTHIAZIDE 25 MG PO TABS: 25.0000 mg | ORAL_TABLET | Freq: Every day | ORAL | Status: DC

## 2014-05-15 NOTE — Telephone Encounter (Signed)
Zofran rx faxed to [place].

## 2014-06-03 ENCOUNTER — Encounter: Payer: Self-pay | Admitting: *Deleted

## 2014-06-12 ENCOUNTER — Ambulatory Visit (INDEPENDENT_AMBULATORY_CARE_PROVIDER_SITE_OTHER): Payer: Medicaid Other | Admitting: Internal Medicine

## 2014-06-12 ENCOUNTER — Encounter: Payer: Self-pay | Admitting: Internal Medicine

## 2014-06-12 VITALS — BP 128/80 | HR 106 | Temp 97.6°F | Ht 64.5 in | Wt 173.6 lb

## 2014-06-12 DIAGNOSIS — Z794 Long term (current) use of insulin: Secondary | ICD-10-CM

## 2014-06-12 DIAGNOSIS — IMO0002 Reserved for concepts with insufficient information to code with codable children: Secondary | ICD-10-CM

## 2014-06-12 DIAGNOSIS — E162 Hypoglycemia, unspecified: Secondary | ICD-10-CM

## 2014-06-12 DIAGNOSIS — E11649 Type 2 diabetes mellitus with hypoglycemia without coma: Secondary | ICD-10-CM

## 2014-06-12 DIAGNOSIS — E1165 Type 2 diabetes mellitus with hyperglycemia: Secondary | ICD-10-CM

## 2014-06-12 LAB — GLUCOSE, CAPILLARY
GLUCOSE-CAPILLARY: 37 mg/dL — AB (ref 65–99)
GLUCOSE-CAPILLARY: 75 mg/dL (ref 65–99)

## 2014-06-12 LAB — POCT GLYCOSYLATED HEMOGLOBIN (HGB A1C): Hemoglobin A1C: 7.3

## 2014-06-12 NOTE — Assessment & Plan Note (Signed)
Pt apparently took 20 units of novolog and walked to clinic without eating breakfast. He then became very irritable and had trouble walking. CBG upon arrival was 37. Pt was alert and taking and no focal deficits.  -glucose gel was given and orange juice pt had breakfast with him but refused to eat -CBG then 72 and pt wanted to leave without continued observation -pt educated on importance of eating after any insulin administration

## 2014-06-12 NOTE — Progress Notes (Signed)
Internal Medicine Clinic Attending  Case discussed with Dr. Sadek soon after the resident saw the patient.  We reviewed the resident's history and exam and pertinent patient test results.  I agree with the assessment, diagnosis, and plan of care documented in the resident's note. 

## 2014-06-12 NOTE — Assessment & Plan Note (Signed)
Lab Results  Component Value Date   HGBA1C 7.3 06/12/2014   HGBA1C 8.5 03/20/2014   HGBA1C 8.5 12/26/2013     Assessment: Diabetes control:   Progress toward A1C goal:    Comments: excellent control compared to previous   Plan: Medications:  continue current medications Home glucose monitoring: Frequency:   Timing:   Instruction/counseling given: reminded to bring blood glucose meter & log to each visit and reminded to bring medications to each visit Educational resources provided: brochure, handout Self management tools provided:   Other plans: f/u in 1-3 months

## 2014-06-12 NOTE — Progress Notes (Signed)
CBG 37 x 2 at 10:30AM - pt was given 1 tube glucose gel and one OJ. CBG was rechecked 10:45AM CBG was 75. Dr Algis Liming aware. Hilda Blades Renay Crammer RN 06/12/14 11AM

## 2014-06-12 NOTE — Progress Notes (Signed)
Subjective:   Patient ID: Michael [name] male   DOB: 1955/03/07 59 y.o.   MRN: [id number]  HPI: [name] is a 59 y.o. man pmh as listed below presents for DM follow up.   DM - Patient checking blood sugars intermittently and doesn't bring his CBG meter in for review. No hypoglycemic episodes since last visit. denies polyuria, polydipsia, nausea, vomiting, diarrhea.  does not request refills today.   Past Medical History  Diagnosis Date  . Chronic pancreatitis   . Smoker   . Gastritis due to alcohol without hemorrhage   . HTN (hypertension)   . Pancreatic lesion     appears innocent and related to chronic pancreatitis  . Heavy alcohol use     hx of  . Marijuana use     hx of  . Migraine   . Depression   . Helicobacter pylori gastritis 03/27/2011    on endoscopy, treated with omeprazole, clarithromycin and amoxicillin  . Insomnia   . ACE inhibitor-aggravated angioedema   . Sessile colonic polyp   . Diabetes mellitus   . GERD (gastroesophageal reflux disease)   . Chronic abdominal pain on narcotics 06/27/2012   Current Outpatient Prescriptions  Medication Sig Dispense Refill  . ACCU-CHEK AVIVA PLUS test strip check blood sugar THREE TIMES DAILY BEFORE MEALS AND AT BEDTIME 100 each 12  . ARIPiprazole (ABILIFY) 15 MG tablet Take 15 mg by mouth at bedtime. Takes with 5 mg and 2 mg for a 22 mg dose    . ARIPiprazole (ABILIFY) 2 MG tablet Take 2 mg by mouth at bedtime. Takes with 15 mg and 5 mg for a 22 mg dose    . ARIPiprazole (ABILIFY) 5 MG tablet Take 5 mg by mouth at bedtime. Takes with 15 mg and 2 mg for a 22 mg dose    . aspirin EC 81 MG tablet Take 81 mg by mouth at bedtime.    . B-D UF III MINI PEN NEEDLES 31G X 5 MM MISC USE TO INJECT insulin THREE TIMES DAILY 100 each 3  . Blood Glucose Monitoring Suppl (ACCU-CHEK NANO SMARTVIEW) W/DEVICE KIT 1 each by Does not apply route 3 (three) times daily. Dx code 250.00 insulin requiring 1 kit 0  . FLUoxetine (PROZAC) 20 MG  capsule Take 20 mg by mouth 2 (two) times daily.     Marland Kitchen glipiZIDE (GLUCOTROL) 10 MG tablet Take 10 mg by mouth 2 (two) times daily before a meal.    . glucose blood (ACCU-CHEK SMARTVIEW) test strip Check blood sugar 3 times a day before meals and bedtime Dx code E11.65 insulin requiring 100 each 12  . glucose blood (AGAMATRIX PRESTO TEST) test strip Use as instructed 100 each 12  . hydrochlorothiazide (HYDRODIURIL) 25 MG tablet Take 1 tablet (25 mg total) by mouth daily. 90 tablet 3  . insulin aspart (NOVOLOG) 100 UNIT/ML injection Inject 20 Units into the skin 2 (two) times daily.    . Insulin Glargine (LANTUS SOLOSTAR) 100 UNIT/ML Solostar Pen Inject 0.65 mLs (65 Units total) into the skin at bedtime 30 mL 11  . lovastatin (MEVACOR) 40 MG tablet Take 1 tablet (40 mg total) by mouth at bedtime. 30 tablet 2  . meloxicam (MOBIC) 7.5 MG tablet Take 1 tablet (7.5 mg total) by mouth daily. 30 tablet 5  . metFORMIN (GLUCOPHAGE) 500 MG tablet Take 1,000 mg by mouth 2 (two) times daily with a meal.    . ondansetron (ZOFRAN) 4 MG tablet  Take 1 tablet (4 mg total) by mouth every 6 (six) hours. 6 tablet 5  . Pancrelipase, Lip-Prot-Amyl, (CREON) 36000 UNITS CPEP Take 36,000 Units by mouth 3 (three) times daily.    . pantoprazole (PROTONIX) 40 MG tablet Take 1 tablet (40 mg total) by mouth daily. 90 tablet 0  . promethazine (PHENERGAN) 25 MG tablet Take 1 tablet (25 mg total) by mouth every 6 (six) hours as needed. for nausea 15 tablet 0  . Vitamin D, Ergocalciferol, (DRISDOL) 50000 UNITS CAPS capsule Take 50,000 Units by mouth every 7 (seven) days. Every Sunday     No current facility-administered medications for this visit.   Family History  Problem Relation Age of Onset  . Colon cancer Neg Hx   . Stomach cancer Neg Hx   . Anesthesia problems Neg Hx   . Hypotension Neg Hx   . Pseudochol deficiency Neg Hx   . Malignant hyperthermia Neg Hx   . Hypertension Sister   . Diabetes Sister   . Heart disease  Sister    History   Social History  . Marital Status: Single    Spouse Name: N/A  . Number of Children: 0  . Years of Education: 11   Occupational History  .    [employer]    worked for 6 years  . 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 Smoking status: Current Every Day Smoker -- 0.60 packs/day for 30 years    Types: Cigarettes  . Smokeless tobacco: Never Used     Comment: 4 -5 cigs/day.Wants to quit. Up to 7-8 cigs per day  . Alcohol Use: No     Comment: sobriety since 2/13,was drinking 7-10 beer/daily  . Drug Use: Not on file     Comment: Feb. 2013 last use  . Sexual Activity: Not on file   Other Topics Concern  . None   Social History Narrative   Has been living at 3 different friend's homes since discharge 01/2011. Was living in the shelter prior to admission. Had previously lived with his niece and other family members but he has gotten kicked out each time.    Review of Systems: Pertinent items are noted in HPI. Objective:  Physical Exam: Filed Vitals:   06/12/14 1004  BP: 128/80  Pulse: 106  Temp: 97.6 F (36.4 C)  TempSrc: Oral  Height: 5' 4.5" (1.638 m)  Weight: 173 lb 9.6 oz (78.744 kg)  SpO2: 100%   General: sitting in chair, NAD Cardiac: RRR, no rubs, murmurs or gallops Pulm: clear to auscultation bilaterally, moving normal volumes of air Abd: soft, nontender, nondistended, BS present Ext: warm and well perfused, no pedal edema Psych: very manic and irritable   Assessment & Plan:  Please see problem oriented charting  Pt discussed with Dr. Lynnae January

## 2014-06-12 NOTE — Patient Instructions (Signed)
General Instructions:   Thank you for bringing your medicines today. This helps [place] keep you safe from mistakes.   Follow up on your diabetes in 1-2 months    Progress Toward Treatment Goals:  Treatment Goal 08/12/2012  Hemoglobin A1C at goal  Blood pressure improved  Stop smoking -  Prevent falls -    Self Care Goals & Plans:  Self Care Goal 06/12/2014  Manage my medications take my medicines as prescribed; bring my medications to every visit; refill my medications on time; follow the sick day instructions if I am sick  Monitor my health keep track of my blood glucose; keep track of my weight; check my feet daily  Eat healthy foods eat more vegetables; eat fruit for snacks and desserts; eat baked foods instead of fried foods; eat smaller portions; eat foods that are low in salt; drink diet soda or water instead of juice or soda  Be physically active find an activity I enjoy  Stop smoking -    Home Blood Glucose Monitoring 09/03/2012  Check my blood sugar 4 times a day  When to check my blood sugar -     Care Management & Community Referrals:  Referral 02/03/2013  Referrals made to [facility] nutrition; other (see comments)

## 2014-07-30 ENCOUNTER — Encounter (HOSPITAL_COMMUNITY): Payer: Self-pay | Admitting: Emergency Medicine

## 2014-07-30 ENCOUNTER — Emergency Department (HOSPITAL_COMMUNITY)
Admission: EM | Admit: 2014-07-30 | Discharge: 2014-07-30 | Disposition: A | Discharge status: Home / Self Care | Payer: Medicaid Other | Attending: Emergency Medicine | Admitting: Emergency Medicine

## 2014-07-30 DIAGNOSIS — M25561 Pain in right knee: Secondary | ICD-10-CM | POA: Insufficient documentation

## 2014-07-30 DIAGNOSIS — Z86010 Personal history of colonic polyps: Secondary | ICD-10-CM | POA: Insufficient documentation

## 2014-07-30 DIAGNOSIS — Z8619 Personal history of other infectious and parasitic diseases: Secondary | ICD-10-CM | POA: Insufficient documentation

## 2014-07-30 DIAGNOSIS — G8929 Other chronic pain: Secondary | ICD-10-CM | POA: Diagnosis not present

## 2014-07-30 DIAGNOSIS — F329 Major depressive disorder, single episode, unspecified: Secondary | ICD-10-CM | POA: Insufficient documentation

## 2014-07-30 DIAGNOSIS — I10 Essential (primary) hypertension: Secondary | ICD-10-CM | POA: Diagnosis not present

## 2014-07-30 DIAGNOSIS — Z794 Long term (current) use of insulin: Secondary | ICD-10-CM | POA: Diagnosis not present

## 2014-07-30 DIAGNOSIS — E119 Type 2 diabetes mellitus without complications: Secondary | ICD-10-CM | POA: Insufficient documentation

## 2014-07-30 DIAGNOSIS — K861 Other chronic pancreatitis: Secondary | ICD-10-CM | POA: Diagnosis not present

## 2014-07-30 DIAGNOSIS — Z79899 Other long term (current) drug therapy: Secondary | ICD-10-CM | POA: Diagnosis not present

## 2014-07-30 DIAGNOSIS — Z720 Tobacco use: Secondary | ICD-10-CM | POA: Insufficient documentation

## 2014-07-30 DIAGNOSIS — K219 Gastro-esophageal reflux disease without esophagitis: Secondary | ICD-10-CM | POA: Insufficient documentation

## 2014-07-30 DIAGNOSIS — M2550 Pain in unspecified joint: Secondary | ICD-10-CM

## 2014-07-30 DIAGNOSIS — M25562 Pain in left knee: Secondary | ICD-10-CM | POA: Insufficient documentation

## 2014-07-30 MED ORDER — HYDROCODONE-ACETAMINOPHEN 5-325 MG PO TABS: 1.0000 | ORAL_TABLET | Freq: Four times a day (QID) | ORAL | Status: DC | PRN

## 2014-07-30 NOTE — ED Notes (Signed)
Pt. reports bilateral leg pain onset 4 days ago , denies injury , ambulatory.

## 2014-07-30 NOTE — ED Provider Notes (Signed)
CSN: [id number]     Arrival date & time 07/30/14  0038 History   First MD Initiated Contact with Patient 07/30/14 0054     Chief Complaint  Patient presents with  . Leg Pain     (Consider location/radiation/quality/duration/timing/severity/associated sxs/prior Treatment) HPI Comments: Patient with past medical history of chronic pain, substance abuse, presents to the emergency department with chief complaint of bilateral knee pain 4 days. He denies any injury. He states that he has had arthritis. States that he needs something for his pain. He has not tried taking anything to alleviate his symptoms. There are no aggravating or alleviating factors. He denies any associated fevers, chills, nausea, vomiting.  The history is provided by the patient. No language interpreter was used.    Past Medical History  Diagnosis Date  . Chronic pancreatitis   . Smoker   . Gastritis due to alcohol without hemorrhage   . HTN (hypertension)   . Pancreatic lesion     appears innocent and related to chronic pancreatitis  . Heavy alcohol use     hx of  . Marijuana use     hx of  . Migraine   . Depression   . Helicobacter pylori gastritis 03/27/2011    on endoscopy, treated with omeprazole, clarithromycin and amoxicillin  . Insomnia   . ACE inhibitor-aggravated angioedema   . Sessile colonic polyp   . Diabetes mellitus   . GERD (gastroesophageal reflux disease)   . Chronic abdominal pain on narcotics 06/27/2012   Past Surgical History  Procedure Laterality Date  . Esophagogastroduodenoscopy  03/22/2011    Procedure: ESOPHAGOGASTRODUODENOSCOPY (EGD);  Surgeon: Gatha Mayer, MD;  Location: [facility];  Service: Endoscopy;  Laterality: N/A;  egd with balloon   . Balloon dilation  03/22/2011    Procedure: BALLOON DILATION;  Surgeon: Gatha Mayer, MD;  Location: [facility];  Service: Endoscopy;  Laterality: N/A;  . Eus  04/20/2011    Procedure: UPPER ENDOSCOPIC ULTRASOUND (EUS) LINEAR;  Surgeon:  Milus Banister, MD;  Location: [facility];  Service: Endoscopy;  Laterality: N/A;  . Colonoscopy w/ biopsies and polypectomy  09/12/11  . Colonoscopy N/A 05/22/2012    Procedure: COLONOSCOPY;  Surgeon: Gatha Mayer, MD;  Location: [facility];  Service: Endoscopy;  Laterality: N/A;   Family History  Problem Relation Age of Onset  . Colon cancer Neg Hx   . Stomach cancer Neg Hx   . Anesthesia problems Neg Hx   . Hypotension Neg Hx   . Pseudochol deficiency Neg Hx   . Malignant hyperthermia Neg Hx   . Hypertension Sister   . Diabetes Sister   . Heart disease Sister    History  Substance Use Topics  . Smoking status: Current Every Day Smoker -- 0.60 packs/day for 30 years    Types: Cigarettes  . Smokeless tobacco: Never Used     Comment: 4 -5 cigs/day.Wants to quit. Up to 7-8 cigs per day  . Alcohol Use: No     Comment: sobriety since 2/13,was drinking 7-10 beer/daily    Review of Systems  Constitutional: Negative for fever and chills.  Respiratory: Negative for shortness of breath.   Cardiovascular: Negative for chest pain.  Gastrointestinal: Negative for nausea, vomiting, diarrhea and constipation.  Genitourinary: Negative for dysuria.  Musculoskeletal: Positive for arthralgias.      Allergies  Ace inhibitors and Losartan  Home Medications   Prior to Admission medications   Medication Sig Start Date End Date Taking? Authorizing  Provider  ACCU-CHEK AVIVA PLUS test strip check blood sugar THREE TIMES DAILY BEFORE MEALS AND AT BEDTIME 12/16/13   Jerrye Noble, MD  ARIPiprazole (ABILIFY) 15 MG tablet Take 15 mg by mouth at bedtime. Takes with 5 mg and 2 mg for a 22 mg dose    Historical Provider, MD  ARIPiprazole (ABILIFY) 2 MG tablet Take 2 mg by mouth at bedtime. Takes with 15 mg and 5 mg for a 22 mg dose    Historical Provider, MD  ARIPiprazole (ABILIFY) 5 MG tablet Take 5 mg by mouth at bedtime. Takes with 15 mg and 2 mg for a 22 mg dose    Historical Provider, MD   aspirin EC 81 MG tablet Take 81 mg by mouth at bedtime.    Historical Provider, MD  B-D UF III MINI PEN NEEDLES 31G X 5 MM MISC USE TO INJECT insulin THREE TIMES DAILY 04/13/14   Jerrye Noble, MD  Blood Glucose Monitoring Suppl (ACCU-CHEK NANO SMARTVIEW) W/DEVICE KIT 1 each by Does not apply route 3 (three) times daily. Dx code 250.00 insulin requiring 10/07/12   Dominic Pea, DO  FLUoxetine (PROZAC) 20 MG capsule Take 20 mg by mouth 2 (two) times daily.     Historical Provider, MD  glipiZIDE (GLUCOTROL) 10 MG tablet Take 10 mg by mouth 2 (two) times daily before a meal. 07/22/12   Jerrye Noble, MD  glucose blood (ACCU-CHEK SMARTVIEW) test strip Check blood sugar 3 times a day before meals and bedtime Dx code E11.65 insulin requiring 02/11/14   Jerrye Noble, MD  glucose blood (AGAMATRIX PRESTO TEST) test strip Use as instructed 12/11/13   Jerrye Noble, MD  hydrochlorothiazide (HYDRODIURIL) 25 MG tablet Take 1 tablet (25 mg total) by mouth daily. 05/14/14   Jerrye Noble, MD  insulin aspart (NOVOLOG) 100 UNIT/ML injection Inject 20 Units into the skin 2 (two) times daily.    Historical Provider, MD  Insulin Glargine (LANTUS SOLOSTAR) 100 UNIT/ML Solostar Pen Inject 0.65 mLs (65 Units total) into the skin at bedtime 03/20/14   Jerrye Noble, MD  lovastatin (MEVACOR) 40 MG tablet Take 1 tablet (40 mg total) by mouth at bedtime. 03/20/14   Jerrye Noble, MD  meloxicam (MOBIC) 7.5 MG tablet Take 1 tablet (7.5 mg total) by mouth daily. 05/14/14   Jerrye Noble, MD  metFORMIN (GLUCOPHAGE) 500 MG tablet Take 1,000 mg by mouth 2 (two) times daily with a meal.    Historical Provider, MD  ondansetron (ZOFRAN) 4 MG tablet Take 1 tablet (4 mg total) by mouth every 6 (six) hours. 05/14/14   Jerrye Noble, MD  Pancrelipase, Lip-Prot-Amyl, (CREON) 36000 UNITS CPEP Take 36,000 Units by mouth 3 (three) times daily.    Historical Provider, MD  pantoprazole (PROTONIX) 40 MG tablet Take 1 tablet (40 mg total) by mouth daily.  02/07/13   Jerrye Noble, MD  promethazine (PHENERGAN) 25 MG tablet Take 1 tablet (25 mg total) by mouth every 6 (six) hours as needed. for nausea 03/20/14   Jerrye Noble, MD  Vitamin D, Ergocalciferol, (DRISDOL) 50000 UNITS CAPS capsule Take 50,000 Units by mouth every 7 (seven) days. Every Sunday    Historical Provider, MD   BP 117/86 mmHg  Pulse 115  Temp(Src) 98.6 F (37 C) (Oral)  Resp 16  Wt 159 lb (72.122 kg)  SpO2 99% Physical Exam  Constitutional: He is oriented to person, place, and time. He appears well-developed and  well-nourished.  HENT:  Head: Normocephalic and atraumatic.  Eyes: Conjunctivae and EOM are normal.  Neck: Normal range of motion.  Cardiovascular: Normal rate.   Pulmonary/Chest: Effort normal.  Abdominal: He exhibits no distension.  Musculoskeletal: Normal range of motion.  Bilateral knees are unremarkable, normal range of motion and strength, no evidence of infection, DVT, Baker's cyst, or other abnormality  Neurological: He is alert and oriented to person, place, and time.  Skin: Skin is dry.  Psychiatric: He has a normal mood and affect. His behavior is normal. Judgment and thought content normal.  Nursing note and vitals reviewed.   ED Course  Procedures (including critical care time) Labs Review Labs Reviewed - No data to display  Imaging Review No results found.   EKG Interpretation None      MDM   Final diagnoses:  Arthralgia    Patient with bilateral knee pain. Patient is able to ambulate without difficulty. Will treat with Ultram. No indication for imaging. No evidence of infection, DVT, or other acute emergent process.    Montine Circle, PA-C 07/30/14 [id number]  Linton Flemings, MD 07/30/14 [phone]

## 2014-07-30 NOTE — Discharge Instructions (Signed)

## 2014-07-30 NOTE — ED Notes (Signed)
Pt verbalizes understanding of d/c instructions and denies any further needs at this time. 

## 2014-08-11 ENCOUNTER — Encounter (HOSPITAL_COMMUNITY): Payer: Self-pay | Admitting: Neurology

## 2014-08-11 ENCOUNTER — Emergency Department (HOSPITAL_COMMUNITY)
Admission: EM | Admit: 2014-08-11 | Discharge: 2014-08-11 | Disposition: A | Discharge status: Home / Self Care | Payer: Medicaid Other | Attending: Emergency Medicine | Admitting: Emergency Medicine

## 2014-08-11 DIAGNOSIS — E119 Type 2 diabetes mellitus without complications: Secondary | ICD-10-CM | POA: Insufficient documentation

## 2014-08-11 DIAGNOSIS — Z794 Long term (current) use of insulin: Secondary | ICD-10-CM | POA: Diagnosis not present

## 2014-08-11 DIAGNOSIS — Z791 Long term (current) use of non-steroidal anti-inflammatories (NSAID): Secondary | ICD-10-CM | POA: Insufficient documentation

## 2014-08-11 DIAGNOSIS — R109 Unspecified abdominal pain: Secondary | ICD-10-CM | POA: Diagnosis present

## 2014-08-11 DIAGNOSIS — F329 Major depressive disorder, single episode, unspecified: Secondary | ICD-10-CM | POA: Insufficient documentation

## 2014-08-11 DIAGNOSIS — G8929 Other chronic pain: Secondary | ICD-10-CM | POA: Diagnosis not present

## 2014-08-11 DIAGNOSIS — Z7982 Long term (current) use of aspirin: Secondary | ICD-10-CM | POA: Diagnosis not present

## 2014-08-11 DIAGNOSIS — I10 Essential (primary) hypertension: Secondary | ICD-10-CM | POA: Diagnosis not present

## 2014-08-11 DIAGNOSIS — A599 Trichomoniasis, unspecified: Secondary | ICD-10-CM | POA: Insufficient documentation

## 2014-08-11 DIAGNOSIS — Z86010 Personal history of colonic polyps: Secondary | ICD-10-CM | POA: Insufficient documentation

## 2014-08-11 DIAGNOSIS — Z720 Tobacco use: Secondary | ICD-10-CM | POA: Insufficient documentation

## 2014-08-11 DIAGNOSIS — K219 Gastro-esophageal reflux disease without esophagitis: Secondary | ICD-10-CM | POA: Diagnosis not present

## 2014-08-11 DIAGNOSIS — G43909 Migraine, unspecified, not intractable, without status migrainosus: Secondary | ICD-10-CM | POA: Diagnosis not present

## 2014-08-11 DIAGNOSIS — M545 Low back pain: Secondary | ICD-10-CM | POA: Diagnosis not present

## 2014-08-11 DIAGNOSIS — Z79899 Other long term (current) drug therapy: Secondary | ICD-10-CM | POA: Insufficient documentation

## 2014-08-11 LAB — COMPREHENSIVE METABOLIC PANEL
ALBUMIN: 3.3 g/dL — AB (ref 3.5–5.0)
ALT: 61 U/L (ref 17–63)
AST: 95 U/L — ABNORMAL HIGH (ref 15–41)
Alkaline Phosphatase: 137 U/L — ABNORMAL HIGH (ref 38–126)
Anion gap: 10 (ref 5–15)
BILIRUBIN TOTAL: 0.7 mg/dL (ref 0.3–1.2)
BUN: 6 mg/dL (ref 6–20)
CALCIUM: 8.2 mg/dL — AB (ref 8.9–10.3)
CO2: 27 mmol/L (ref 22–32)
Chloride: 108 mmol/L (ref 101–111)
Creatinine, Ser: 0.9 mg/dL (ref 0.61–1.24)
GFR calc Af Amer: >60 mL/min (ref >60)
GFR calc non Af Amer: >60 mL/min (ref >60)
Glucose, Bld: 148 mg/dL — ABNORMAL HIGH (ref 65–99)
POTASSIUM: 3.3 mmol/L — AB (ref 3.5–5.1)
Sodium: 145 mmol/L (ref 135–145)
Total Protein: 6.1 g/dL — ABNORMAL LOW (ref 6.5–8.1)

## 2014-08-11 LAB — URINALYSIS, ROUTINE W REFLEX MICROSCOPIC
Bilirubin Urine: NEGATIVE
Glucose, UA: 250 mg/dL — AB
Hgb urine dipstick: NEGATIVE
Ketones, ur: 15 mg/dL — AB
LEUKOCYTES UA: NEGATIVE
Nitrite: NEGATIVE
Protein, ur: 30 mg/dL — AB
Specific Gravity, Urine: 1.021 (ref 1.005–1.030)
UROBILINOGEN UA: 1 mg/dL (ref 0.0–1.0)
pH: 6 (ref 5.0–8.0)

## 2014-08-11 LAB — URINE MICROSCOPIC-ADD ON

## 2014-08-11 LAB — CBC WITH DIFFERENTIAL/PLATELET
Basophils Absolute: 0.1 10*3/uL (ref 0.0–0.1)
Basophils Relative: 1 % (ref 0–1)
Eosinophils Absolute: 0.2 10*3/uL (ref 0.0–0.7)
Eosinophils Relative: 3 % (ref 0–5)
HCT: 38.4 % — ABNORMAL LOW (ref 39.0–52.0)
Hemoglobin: 13.9 g/dL (ref 13.0–17.0)
Lymphocytes Relative: 38 % (ref 12–46)
Lymphs Abs: 2 10*3/uL (ref 0.7–4.0)
MCH: 34.3 pg — AB (ref 26.0–34.0)
MCHC: 36.2 g/dL — ABNORMAL HIGH (ref 30.0–36.0)
MCV: 94.8 fL (ref 78.0–100.0)
MONO ABS: 0.9 10*3/uL (ref 0.1–1.0)
Monocytes Relative: 17 % — ABNORMAL HIGH (ref 3–12)
NEUTROS PCT: 41 % — AB (ref 43–77)
Neutro Abs: 2.3 10*3/uL (ref 1.7–7.7)
PLATELETS: 148 10*3/uL — AB (ref 150–400)
RBC: 4.05 MIL/uL — AB (ref 4.22–5.81)
RDW: 15 % (ref 11.5–15.5)
WBC: 5.4 10*3/uL (ref 4.0–10.5)

## 2014-08-11 LAB — LIPASE, BLOOD: LIPASE: 25 U/L (ref 22–51)

## 2014-08-11 MED ORDER — HYDROCODONE-ACETAMINOPHEN 5-325 MG PO TABS
1.0000 | ORAL_TABLET | Freq: Once | ORAL | Status: AC
  Administered 2014-08-11: 1 via ORAL
  Filled 2014-08-11: qty 1

## 2014-08-11 MED ORDER — LIDOCAINE HCL (PF) 1 % IJ SOLN
INTRAMUSCULAR | Status: AC
  Filled 2014-08-11: qty 5

## 2014-08-11 MED ORDER — CEFTRIAXONE SODIUM 250 MG IJ SOLR
250.0000 mg | Freq: Once | INTRAMUSCULAR | Status: AC
  Administered 2014-08-11: 250 mg via INTRAMUSCULAR
  Filled 2014-08-11: qty 250

## 2014-08-11 MED ORDER — METRONIDAZOLE 500 MG PO TABS
2000.0000 mg | ORAL_TABLET | Freq: Once | ORAL | Status: AC
  Administered 2014-08-11: 2000 mg via ORAL
  Filled 2014-08-11: qty 4

## 2014-08-11 MED ORDER — AZITHROMYCIN 250 MG PO TABS
1000.0000 mg | ORAL_TABLET | Freq: Once | ORAL | Status: AC
  Administered 2014-08-11: 1000 mg via ORAL
  Filled 2014-08-11: qty 4

## 2014-08-11 MED ORDER — LIDOCAINE HCL (PF) 1 % IJ SOLN
0.9000 mL | Freq: Once | INTRAMUSCULAR | Status: AC
  Administered 2014-08-11: 0.9 mL

## 2014-08-11 NOTE — ED Notes (Addendum)
Per EMS-Pt reports upper abd pain, right and left flank pain and bilateral leg pain for 10 years. BP 150/98 HR 100, RR 18, 100%, CBG 145.

## 2014-08-11 NOTE — ED Provider Notes (Signed)
CSN: [id number]     Arrival date & time 08/11/14  0725 History   First MD Initiated Contact with Patient 08/11/14 [phone]     Chief Complaint  Patient presents with  . Abdominal Pain     (Consider location/radiation/quality/duration/timing/severity/associated sxs/prior Treatment) HPI Comments: Patient with h/o pancreatitis, chronic abdominal pain -- presents with abdominal pain that has been present since 2007. Patient states that his pain today is the same pain that he has been having. He states that 'this has been going on for a long time'. Pain is dull and radiates to bilateral flanks and into his back. He states that he was here a week ago for the same thing and that he was given medicine to 'cut the pain'. He states he has run out though. States that he vomited x 1 this morning. No fever, CP, cough. No urinary tract symptoms. States no symptoms with eating. No h/o abdominal surgeries. Has been monitored in past for pancreatic abnormality with MRCP. The course is chronic. Aggravating factors: palpation. Alleviating factors: none.    Patient is a 59 y.o. male presenting with abdominal pain. The history is provided by the patient and medical records.  Abdominal Pain Associated symptoms: nausea and vomiting   Associated symptoms: no chest pain, no cough, no diarrhea, no dysuria, no fever and no sore throat     Past Medical History  Diagnosis Date  . Chronic pancreatitis   . Smoker   . Gastritis due to alcohol without hemorrhage   . HTN (hypertension)   . Pancreatic lesion     appears innocent and related to chronic pancreatitis  . Heavy alcohol use     hx of  . Marijuana use     hx of  . Migraine   . Depression   . Helicobacter pylori gastritis 03/27/2011    on endoscopy, treated with omeprazole, clarithromycin and amoxicillin  . Insomnia   . ACE inhibitor-aggravated angioedema   . Sessile colonic polyp   . Diabetes mellitus   . GERD (gastroesophageal reflux disease)   . Chronic  abdominal pain on narcotics 06/27/2012   Past Surgical History  Procedure Laterality Date  . Esophagogastroduodenoscopy  03/22/2011    Procedure: ESOPHAGOGASTRODUODENOSCOPY (EGD);  Surgeon: Gatha Mayer, MD;  Location: [facility];  Service: Endoscopy;  Laterality: N/A;  egd with balloon   . Balloon dilation  03/22/2011    Procedure: BALLOON DILATION;  Surgeon: Gatha Mayer, MD;  Location: [facility];  Service: Endoscopy;  Laterality: N/A;  . Eus  04/20/2011    Procedure: UPPER ENDOSCOPIC ULTRASOUND (EUS) LINEAR;  Surgeon: Milus Banister, MD;  Location: [facility];  Service: Endoscopy;  Laterality: N/A;  . Colonoscopy w/ biopsies and polypectomy  09/12/11  . Colonoscopy N/A 05/22/2012    Procedure: COLONOSCOPY;  Surgeon: Gatha Mayer, MD;  Location: [place];  Service: Endoscopy;  Laterality: N/A;   Family History  Problem Relation Age of Onset  . Colon cancer Neg Hx   . Stomach cancer Neg Hx   . Anesthesia problems Neg Hx   . Hypotension Neg Hx   . Pseudochol deficiency Neg Hx   . Malignant hyperthermia Neg Hx   . Hypertension Sister   . Diabetes Sister   . Heart disease Sister    History  Substance Use Topics  . Smoking status: Current Every Day Smoker -- 0.60 packs/day for 30 years    Types: Cigarettes  . Smokeless tobacco: Never Used     Comment: 4 -  5 cigs/day.Wants to quit. Up to 7-8 cigs per day  . Alcohol Use: No     Comment: sobriety since 2/13,was drinking 7-10 beer/daily    Review of Systems  Constitutional: Negative for fever.  HENT: Negative for rhinorrhea and sore throat.   Eyes: Negative for redness.  Respiratory: Negative for cough.   Cardiovascular: Negative for chest pain.  Gastrointestinal: Positive for nausea, vomiting and abdominal pain. Negative for diarrhea.  Genitourinary: Negative for dysuria.  Musculoskeletal: Positive for back pain. Negative for myalgias.  Skin: Negative for rash.  Neurological: Negative for headaches.       Allergies  Ace inhibitors and Losartan  Home Medications   Prior to Admission medications   Medication Sig Start Date End Date Taking? Authorizing Provider  ACCU-CHEK AVIVA PLUS test strip check blood sugar THREE TIMES DAILY BEFORE MEALS AND AT BEDTIME 12/16/13   Jerrye Noble, MD  ARIPiprazole (ABILIFY) 15 MG tablet Take 15 mg by mouth at bedtime. Takes with 5 mg and 2 mg for a 22 mg dose    Historical Provider, MD  ARIPiprazole (ABILIFY) 2 MG tablet Take 2 mg by mouth at bedtime. Takes with 15 mg and 5 mg for a 22 mg dose    Historical Provider, MD  ARIPiprazole (ABILIFY) 5 MG tablet Take 5 mg by mouth at bedtime. Takes with 15 mg and 2 mg for a 22 mg dose    Historical Provider, MD  aspirin EC 81 MG tablet Take 81 mg by mouth at bedtime.    Historical Provider, MD  B-D UF III MINI PEN NEEDLES 31G X 5 MM MISC USE TO INJECT insulin THREE TIMES DAILY 04/13/14   Jerrye Noble, MD  Blood Glucose Monitoring Suppl (ACCU-CHEK NANO SMARTVIEW) W/DEVICE KIT 1 each by Does not apply route 3 (three) times daily. Dx code 250.00 insulin requiring 10/07/12   Dominic Pea, DO  FLUoxetine (PROZAC) 20 MG capsule Take 20 mg by mouth 2 (two) times daily.     Historical Provider, MD  glipiZIDE (GLUCOTROL) 10 MG tablet Take 10 mg by mouth 2 (two) times daily before a meal. 07/22/12   Jerrye Noble, MD  glucose blood (ACCU-CHEK SMARTVIEW) test strip Check blood sugar 3 times a day before meals and bedtime Dx code E11.65 insulin requiring 02/11/14   Jerrye Noble, MD  glucose blood (AGAMATRIX PRESTO TEST) test strip Use as instructed 12/11/13   Jerrye Noble, MD  hydrochlorothiazide (HYDRODIURIL) 25 MG tablet Take 1 tablet (25 mg total) by mouth daily. 05/14/14   Jerrye Noble, MD  HYDROcodone-acetaminophen (NORCO/VICODIN) 5-325 MG per tablet Take 1 tablet by mouth every 6 (six) hours as needed. 07/30/14   Montine Circle, PA-C  insulin aspart (NOVOLOG) 100 UNIT/ML injection Inject 20 Units into the skin 2 (two) times  daily.    Historical Provider, MD  Insulin Glargine (LANTUS SOLOSTAR) 100 UNIT/ML Solostar Pen Inject 0.65 mLs (65 Units total) into the skin at bedtime 03/20/14   Jerrye Noble, MD  lovastatin (MEVACOR) 40 MG tablet Take 1 tablet (40 mg total) by mouth at bedtime. 03/20/14   Jerrye Noble, MD  meloxicam (MOBIC) 7.5 MG tablet Take 1 tablet (7.5 mg total) by mouth daily. 05/14/14   Jerrye Noble, MD  metFORMIN (GLUCOPHAGE) 500 MG tablet Take 1,000 mg by mouth 2 (two) times daily with a meal.    Historical Provider, MD  ondansetron (ZOFRAN) 4 MG tablet Take 1 tablet (4 mg total) by mouth  every 6 (six) hours. 05/14/14   Jerrye Noble, MD  Pancrelipase, Lip-Prot-Amyl, (CREON) 36000 UNITS CPEP Take 36,000 Units by mouth 3 (three) times daily.    Historical Provider, MD  pantoprazole (PROTONIX) 40 MG tablet Take 1 tablet (40 mg total) by mouth daily. 02/07/13   Jerrye Noble, MD  promethazine (PHENERGAN) 25 MG tablet Take 1 tablet (25 mg total) by mouth every 6 (six) hours as needed. for nausea 03/20/14   Jerrye Noble, MD  Vitamin D, Ergocalciferol, (DRISDOL) 50000 UNITS CAPS capsule Take 50,000 Units by mouth every 7 (seven) days. Every Sunday    Historical Provider, MD   BP 144/84 mmHg  Pulse 101  Temp(Src) 98.8 F (37.1 C) (Oral)  Resp 20  SpO2 99%   Physical Exam  Constitutional: He appears well-developed and well-nourished.  HENT:  Head: Normocephalic and atraumatic.  Mouth/Throat: Oropharynx is clear and moist.  Eyes: Conjunctivae are normal. Right eye exhibits no discharge. Left eye exhibits no discharge.  Neck: Normal range of motion. Neck supple.  Cardiovascular: Normal rate, regular rhythm and normal heart sounds.   No murmur heard. Pulmonary/Chest: Effort normal and breath sounds normal. No respiratory distress. He has no wheezes. He has no rales.  Abdominal: Soft. There is tenderness (patient is tender everywhere over entire abdomen with even light touch).  Neurological: He is alert.  Skin:  Skin is warm and dry.  Psychiatric: He has a normal mood and affect.  Nursing note and vitals reviewed.   ED Course  Procedures (including critical care time) Labs Review Labs Reviewed  CBC WITH DIFFERENTIAL/PLATELET - Abnormal; Notable for the following:    RBC 4.05 (*)    HCT 38.4 (*)    MCH 34.3 (*)    MCHC 36.2 (*)    Platelets 148 (*)    Neutrophils Relative % 41 (*)    Monocytes Relative 17 (*)    All other components within normal limits  COMPREHENSIVE METABOLIC PANEL - Abnormal; Notable for the following:    Potassium 3.3 (*)    Glucose, Bld 148 (*)    Calcium 8.2 (*)    Total Protein 6.1 (*)    Albumin 3.3 (*)    AST 95 (*)    Alkaline Phosphatase 137 (*)    All other components within normal limits  URINALYSIS, ROUTINE W REFLEX MICROSCOPIC (NOT AT [facility]) - Abnormal; Notable for the following:    Glucose, UA 250 (*)    Ketones, ur 15 (*)    Protein, ur 30 (*)    All other components within normal limits  URINE MICROSCOPIC-ADD ON - Abnormal; Notable for the following:    Squamous Epithelial / LPF FEW (*)    Bacteria, UA FEW (*)    All other components within normal limits  LIPASE, BLOOD  GC/CHLAMYDIA PROBE AMP ([place]) NOT AT [facility]    Imaging Review No results found.   EKG Interpretation   Date/Time:  Tuesday August 11 2014 08:19:35 EDT Ventricular Rate:  87 PR Interval:  126 QRS Duration: 67 QT Interval:  381 QTC Calculation: 458 R Axis:   64 Text Interpretation:  Sinus rhythm Anteroseptal infarct, old Borderline  repolarization abnormality No significant change since last tracing  Confirmed by HARRISON  MD, FORREST ([id number]) on 08/11/2014 8:25:04 AM       8:22 AM Patient seen and examined. Work-up initiated. Medications ordered.   Vital signs reviewed and are as follows: BP 144/84 mmHg  Pulse 101  Temp(Src) 98.8  F (37.1 C) (Oral)  Resp 20  SpO2 99%  9:41 AM Patient complains that Vicodin was not strong enough and is not working. He appears  well however and is up and down in room and in the hall. No vomiting.   Vitals are at patient's baseline. Lipase is normal. WBC count is normal. Mildly low potassium but normal EKG. Patient has a history of mildly elevated transaminases.   Patient has trichomonas in urine. Urine probe sent for GC/chlamydia. Patient informed of this finding and agrees to be treated for this as well as GC and chlamydia. He admits to recent sexual intercourse.   Patient counseled on safe sexual practices. Told them that they should not have sexual contact for next 7 days and that they need to inform sexual partners so that they can get tested and treated as well. Urged f/u with [place] [facility] for HIV and syphilis testing.  Patient verbalizes understanding and agrees with plan.    Discussed that I will be unable to manage chronic pain here and patient is referred to his PCP for discussion of how best to manage his pain.   The patient was urged to return to the Emergency Department immediately with worsening of current symptoms, worsening abdominal pain, persistent vomiting, blood noted in stools, fever, or any other concerns. The patient verbalized understanding.    MDM   Final diagnoses:  Trichomonas infection  Chronic abdominal pain   Trichomonas infection: treated as above  Chronic abd pain: work-up is unrevealing. Patient has a non-focal abdominal exam and only appears uncomfortable when he is actually being examined. He moves well without distress. He is adamant he has had these symptoms for 10 years and they have been constant. Vitals are stable, no fever. No signs of dehydration, tolerating PO's. Lungs are clear. Again no focal abdominal pain, and I do not suspect appendicitis, cholecystitis, pancreatitis, ruptured viscus, UTI, kidney stone, or any other emergent abdominal etiology at this time.   No dangerous or life-threatening conditions suspected or identified by history, physical exam, and by  work-up. No indications for hospitalization identified.     Carlisle Cater, PA-C 08/11/14 [place], MD 08/11/14 [phone]

## 2014-08-11 NOTE — Discharge Instructions (Signed)
Please read and follow all provided instructions.  Your diagnoses today include:  1. Trichomonas infection   2. Chronic abdominal pain     Tests performed today include:  Blood counts and electrolytes  Blood tests to check liver and kidney function  Blood tests to check pancreas function  Urine test to look for infection - shows trichomonas  Vital signs. See below for your results today.   Medications prescribed:   None  Take any prescribed medications only as directed.  Home care instructions:   Follow any educational materials contained in this packet.  Follow-up instructions: Please follow-up with your primary care provider in the next 3 days for further evaluation of your symptoms.    Return instructions:  SEEK IMMEDIATE MEDICAL ATTENTION IF:  The pain does not go away or becomes severe   A temperature above 101F develops   Repeated vomiting occurs (multiple episodes)   The pain becomes localized to portions of the abdomen. The right side could possibly be appendicitis. In an adult, the left lower portion of the abdomen could be colitis or diverticulitis.   Blood is being passed in stools or vomit (bright red or black tarry stools)   You develop chest pain, difficulty breathing, dizziness or fainting, or become confused, poorly responsive, or inconsolable (young children)  If you have any other emergent concerns regarding your health  Additional Information: Abdominal (belly) pain can be caused by many things. Your caregiver performed an examination and possibly ordered blood/urine tests and imaging (CT scan, x-rays, ultrasound). Many cases can be observed and treated at home after initial evaluation in the emergency department. Even though you are being discharged home, abdominal pain can be unpredictable. Therefore, you need a repeated exam if your pain does not resolve, returns, or worsens. Most patients with abdominal pain don't have to be admitted to the  hospital or have surgery, but serious problems like appendicitis and gallbladder attacks can start out as nonspecific pain. Many abdominal conditions cannot be diagnosed in one visit, so follow-up evaluations are very important.  Your vital signs today were: BP 149/97 mmHg   Pulse 100   Temp(Src) 98.8 F (37.1 C) (Oral)   Resp 14   SpO2 98% If your blood pressure (bp) was elevated above 135/85 this visit, please have this repeated by your doctor within one month. -------------- Tests performed today include:  Test for gonorrhea and chlamydia. You will be notified by telephone if you have a positive result.  Vital signs. See below for your results today.   Medications:  You were treated for chlamydia (1 gram azithromycin pills), metronidazole (2 grams) and gonorrhea (250mg  rocephin shot).  Home care instructions:  Read educational materials contained in this packet and follow any instructions provided.   You should tell your partners about your infection and avoid having sex for one week to allow time for the medicine to work.  Follow-up instructions: You should follow-up with the [facility] clinic to be tested for HIV, syphilis, and hepatitis -- all of which can be transmitted by sexual contact. We do not routinely screen for these in the Emergency Department.  STD Testing:  [place] [place] [facility]  [address] [place], phone [phone] or [phone]    Monday - Friday, call for an appointment  [place] [place] [facility]  [address], [place], phone [phone] or [phone]   Monday - Friday, call for an  appointment

## 2014-08-12 ENCOUNTER — Emergency Department (HOSPITAL_COMMUNITY): Payer: Medicaid Other

## 2014-08-12 ENCOUNTER — Emergency Department (HOSPITAL_COMMUNITY)
Admission: EM | Admit: 2014-08-12 | Discharge: 2014-08-12 | Disposition: A | Discharge status: Home / Self Care | Payer: Medicaid Other | Attending: Emergency Medicine | Admitting: Emergency Medicine

## 2014-08-12 ENCOUNTER — Encounter (HOSPITAL_COMMUNITY): Payer: Self-pay | Admitting: Emergency Medicine

## 2014-08-12 DIAGNOSIS — G8929 Other chronic pain: Secondary | ICD-10-CM | POA: Diagnosis not present

## 2014-08-12 DIAGNOSIS — K2970 Gastritis, unspecified, without bleeding: Secondary | ICD-10-CM | POA: Insufficient documentation

## 2014-08-12 DIAGNOSIS — Z86010 Personal history of colonic polyps: Secondary | ICD-10-CM | POA: Insufficient documentation

## 2014-08-12 DIAGNOSIS — R1013 Epigastric pain: Secondary | ICD-10-CM | POA: Insufficient documentation

## 2014-08-12 DIAGNOSIS — Z8619 Personal history of other infectious and parasitic diseases: Secondary | ICD-10-CM | POA: Insufficient documentation

## 2014-08-12 DIAGNOSIS — Z79899 Other long term (current) drug therapy: Secondary | ICD-10-CM | POA: Insufficient documentation

## 2014-08-12 DIAGNOSIS — Z794 Long term (current) use of insulin: Secondary | ICD-10-CM | POA: Insufficient documentation

## 2014-08-12 DIAGNOSIS — R1084 Generalized abdominal pain: Secondary | ICD-10-CM | POA: Diagnosis not present

## 2014-08-12 DIAGNOSIS — I10 Essential (primary) hypertension: Secondary | ICD-10-CM | POA: Insufficient documentation

## 2014-08-12 DIAGNOSIS — K219 Gastro-esophageal reflux disease without esophagitis: Secondary | ICD-10-CM | POA: Diagnosis not present

## 2014-08-12 DIAGNOSIS — E119 Type 2 diabetes mellitus without complications: Secondary | ICD-10-CM | POA: Insufficient documentation

## 2014-08-12 DIAGNOSIS — R05 Cough: Secondary | ICD-10-CM | POA: Insufficient documentation

## 2014-08-12 DIAGNOSIS — R197 Diarrhea, unspecified: Secondary | ICD-10-CM | POA: Insufficient documentation

## 2014-08-12 DIAGNOSIS — Z791 Long term (current) use of non-steroidal anti-inflammatories (NSAID): Secondary | ICD-10-CM | POA: Insufficient documentation

## 2014-08-12 DIAGNOSIS — R112 Nausea with vomiting, unspecified: Secondary | ICD-10-CM | POA: Diagnosis not present

## 2014-08-12 DIAGNOSIS — K861 Other chronic pancreatitis: Secondary | ICD-10-CM | POA: Diagnosis not present

## 2014-08-12 DIAGNOSIS — Z720 Tobacco use: Secondary | ICD-10-CM | POA: Diagnosis not present

## 2014-08-12 DIAGNOSIS — F329 Major depressive disorder, single episode, unspecified: Secondary | ICD-10-CM | POA: Insufficient documentation

## 2014-08-12 DIAGNOSIS — R109 Unspecified abdominal pain: Secondary | ICD-10-CM | POA: Diagnosis present

## 2014-08-12 LAB — COMPREHENSIVE METABOLIC PANEL
ALT: 78 U/L — ABNORMAL HIGH (ref 17–63)
AST: 100 U/L — AB (ref 15–41)
Albumin: 3.4 g/dL — ABNORMAL LOW (ref 3.5–5.0)
Alkaline Phosphatase: 151 U/L — ABNORMAL HIGH (ref 38–126)
Anion gap: 9 (ref 5–15)
BILIRUBIN TOTAL: 0.9 mg/dL (ref 0.3–1.2)
CHLORIDE: 106 mmol/L (ref 101–111)
CO2: 29 mmol/L (ref 22–32)
Calcium: 8.2 mg/dL — ABNORMAL LOW (ref 8.9–10.3)
Creatinine, Ser: 0.8 mg/dL (ref 0.61–1.24)
GLUCOSE: 245 mg/dL — AB (ref 65–99)
POTASSIUM: 3.1 mmol/L — AB (ref 3.5–5.1)
SODIUM: 144 mmol/L (ref 135–145)
TOTAL PROTEIN: 6.3 g/dL — AB (ref 6.5–8.1)

## 2014-08-12 LAB — URINALYSIS, ROUTINE W REFLEX MICROSCOPIC
BILIRUBIN URINE: NEGATIVE
Glucose, UA: 500 mg/dL — AB
Hgb urine dipstick: NEGATIVE
Ketones, ur: NEGATIVE mg/dL
Leukocytes, UA: NEGATIVE
Nitrite: NEGATIVE
PROTEIN: NEGATIVE mg/dL
Specific Gravity, Urine: 1.028 (ref 1.005–1.030)
Urobilinogen, UA: 1 mg/dL (ref 0.0–1.0)
pH: 7 (ref 5.0–8.0)

## 2014-08-12 LAB — ETHANOL: Alcohol, Ethyl (B): 365 mg/dL (ref <5)

## 2014-08-12 LAB — CBC
HCT: 41.2 % (ref 39.0–52.0)
Hemoglobin: 14.9 g/dL (ref 13.0–17.0)
MCH: 34 pg (ref 26.0–34.0)
MCHC: 36.2 g/dL — AB (ref 30.0–36.0)
MCV: 94.1 fL (ref 78.0–100.0)
Platelets: 177 10*3/uL (ref 150–400)
RBC: 4.38 MIL/uL (ref 4.22–5.81)
RDW: 14.8 % (ref 11.5–15.5)
WBC: 4.8 10*3/uL (ref 4.0–10.5)

## 2014-08-12 LAB — LIPASE, BLOOD: LIPASE: 21 U/L — AB (ref 22–51)

## 2014-08-12 MED ORDER — IOHEXOL 300 MG/ML  SOLN
100.0000 mL | Freq: Once | INTRAMUSCULAR | Status: AC | PRN
Start: 2014-08-12 — End: 2014-08-12
  Administered 2014-08-12: 100 mL via INTRAVENOUS

## 2014-08-12 MED ORDER — ACETAMINOPHEN 325 MG PO TABS
650.0000 mg | ORAL_TABLET | Freq: Once | ORAL | Status: AC
Start: 2014-08-12 — End: 2014-08-12
  Administered 2014-08-12: 650 mg via ORAL
  Filled 2014-08-12: qty 2

## 2014-08-12 NOTE — ED Notes (Signed)
Pt made aware we need a urine sample.  Provided with a urinal.  Denies need to urinate at this time.

## 2014-08-12 NOTE — ED Notes (Signed)
Pt out of bed again and yelling in the hall asking for the doctor.  This RN reminded patient that it has been 10 minutes since our last conversation and reminded him that the doctor was saving someone's life.  Pt verbalized understanding, voice lowered and pt agreed to wait in room again.

## 2014-08-12 NOTE — ED Notes (Signed)
MD made aware of pt's ethanol level

## 2014-08-12 NOTE — ED Notes (Signed)
Pt aggitated in hallway yelling "where's the doctor at?".  Becky, RN addressed pt and requested he return to his room.  This RN was in another patient's room and came out as soon as possible.  Patient provided with requested pain medication, and this RN spoke to pt about his tone and the way he spoke to everyone.  This RN reminded patient it had been 5 minutes since our last conversation and he went from 0 to 100.  Pt apologized, and agreed to lay back in bed and rest with lights off.  Security requested to [facility] for de-escalation.

## 2014-08-12 NOTE — ED Provider Notes (Signed)
CSN: [id number]   Arrival date & time 08/12/14 0310  History  This chart was scribed for  Michael Freiberg, MD by Altamease Oiler, ED Scribe. This patient was seen in room D30C/D30C and the patient's care was started at 3:37 AM.  Chief Complaint  Patient presents with  . Abdominal Pain    HPI The history is provided by the patient. No language interpreter was used.   Michael [name] is a 59 y.o. male with PMHx of chronic pancreatitis, gastritis, H. Pylori, GERD, and chronic abdominal pain who presents to the Emergency Department complaining of recurrent  abdominal pain that worsened "the other night". The pain is intermittent and rated 9/10 in severity. Pt states that he drinks alcohol to relieve the pain. He last drank 2 cans of beer last night. Associated symptoms include left flank pain, nausea, vomiting, and diarrhea. He denies constipation, sore throat, or fever. Pt is well known to this ED and was last seen yesterday for the same symptoms.   Past Medical History  Diagnosis Date  . Chronic pancreatitis   . Smoker   . Gastritis due to alcohol without hemorrhage   . HTN (hypertension)   . Pancreatic lesion     appears innocent and related to chronic pancreatitis  . Heavy alcohol use     hx of  . Marijuana use     hx of  . Migraine   . Depression   . Helicobacter pylori gastritis 03/27/2011    on endoscopy, treated with omeprazole, clarithromycin and amoxicillin  . Insomnia   . ACE inhibitor-aggravated angioedema   . Sessile colonic polyp   . Diabetes mellitus   . GERD (gastroesophageal reflux disease)   . Chronic abdominal pain on narcotics 06/27/2012    Past Surgical History  Procedure Laterality Date  . Esophagogastroduodenoscopy  03/22/2011    Procedure: ESOPHAGOGASTRODUODENOSCOPY (EGD);  Surgeon: Gatha Mayer, MD;  Location: [facility];  Service: Endoscopy;  Laterality: N/A;  egd with balloon   . Balloon dilation  03/22/2011    Procedure: BALLOON DILATION;  Surgeon: Gatha Mayer, MD;  Location: [facility];  Service: Endoscopy;  Laterality: N/A;  . Eus  04/20/2011    Procedure: UPPER ENDOSCOPIC ULTRASOUND (EUS) LINEAR;  Surgeon: Milus Banister, MD;  Location: [facility];  Service: Endoscopy;  Laterality: N/A;  . Colonoscopy w/ biopsies and polypectomy  09/12/11  . Colonoscopy N/A 05/22/2012    Procedure: COLONOSCOPY;  Surgeon: Gatha Mayer, MD;  Location: [place];  Service: Endoscopy;  Laterality: N/A;    Family History  Problem Relation Age of Onset  . Colon cancer Neg Hx   . Stomach cancer Neg Hx   . Anesthesia problems Neg Hx   . Hypotension Neg Hx   . Pseudochol deficiency Neg Hx   . Malignant hyperthermia Neg Hx   . Hypertension Sister   . Diabetes Sister   . Heart disease Sister     History  Substance Use Topics  . Smoking status: Current Every Day Smoker -- 0.60 packs/day for 30 years    Types: Cigarettes  . Smokeless tobacco: Never Used     Comment: 4 -5 cigs/day.Wants to quit. Up to 7-8 cigs per day  . Alcohol Use: No     Comment: sobriety since 2/13,was drinking 7-10 beer/daily     Review of Systems  Constitutional: Negative for fever.  Respiratory: Positive for cough.   Gastrointestinal: Positive for nausea, vomiting, abdominal pain and diarrhea. Negative for constipation.  All other systems reviewed and are negative.   Home Medications   Prior to Admission medications   Medication Sig Start Date End Date Taking? Authorizing Provider  ACCU-CHEK AVIVA PLUS test strip check blood sugar THREE TIMES DAILY BEFORE MEALS AND AT BEDTIME 12/16/13   Jerrye Noble, MD  ARIPiprazole (ABILIFY) 15 MG tablet Take 15 mg by mouth at bedtime. Takes with 5 mg and 2 mg for a 22 mg dose    Historical Provider, MD  ARIPiprazole (ABILIFY) 2 MG tablet Take 2 mg by mouth at bedtime. Takes with 15 mg and 5 mg for a 22 mg dose    Historical Provider, MD  ARIPiprazole (ABILIFY) 5 MG tablet Take 5 mg by mouth at bedtime. Takes with 15 mg and 2 mg  for a 22 mg dose    Historical Provider, MD  aspirin EC 81 MG tablet Take 81 mg by mouth at bedtime.    Historical Provider, MD  B-D UF III MINI PEN NEEDLES 31G X 5 MM MISC USE TO INJECT insulin THREE TIMES DAILY 04/13/14   Jerrye Noble, MD  Blood Glucose Monitoring Suppl (ACCU-CHEK NANO SMARTVIEW) W/DEVICE KIT 1 each by Does not apply route 3 (three) times daily. Dx code 250.00 insulin requiring 10/07/12   Dominic Pea, DO  FLUoxetine (PROZAC) 20 MG capsule Take 20 mg by mouth 2 (two) times daily.     Historical Provider, MD  glipiZIDE (GLUCOTROL) 10 MG tablet Take 10 mg by mouth 2 (two) times daily before a meal. 07/22/12   Jerrye Noble, MD  glucose blood (ACCU-CHEK SMARTVIEW) test strip Check blood sugar 3 times a day before meals and bedtime Dx code E11.65 insulin requiring 02/11/14   Jerrye Noble, MD  glucose blood (AGAMATRIX PRESTO TEST) test strip Use as instructed 12/11/13   Jerrye Noble, MD  hydrochlorothiazide (HYDRODIURIL) 25 MG tablet Take 1 tablet (25 mg total) by mouth daily. 05/14/14   Jerrye Noble, MD  HYDROcodone-acetaminophen (NORCO/VICODIN) 5-325 MG per tablet Take 1 tablet by mouth every 6 (six) hours as needed. 07/30/14   Montine Circle, PA-C  insulin aspart (NOVOLOG) 100 UNIT/ML injection Inject 20 Units into the skin 2 (two) times daily.    Historical Provider, MD  Insulin Glargine (LANTUS SOLOSTAR) 100 UNIT/ML Solostar Pen Inject 0.65 mLs (65 Units total) into the skin at bedtime 03/20/14   Jerrye Noble, MD  lovastatin (MEVACOR) 40 MG tablet Take 1 tablet (40 mg total) by mouth at bedtime. 03/20/14   Jerrye Noble, MD  meloxicam (MOBIC) 7.5 MG tablet Take 1 tablet (7.5 mg total) by mouth daily. 05/14/14   Jerrye Noble, MD  metFORMIN (GLUCOPHAGE) 500 MG tablet Take 1,000 mg by mouth 2 (two) times daily with a meal.    Historical Provider, MD  ondansetron (ZOFRAN) 4 MG tablet Take 1 tablet (4 mg total) by mouth every 6 (six) hours. 05/14/14   Jerrye Noble, MD  Pancrelipase,  Lip-Prot-Amyl, (CREON) 36000 UNITS CPEP Take 36,000 Units by mouth 3 (three) times daily.    Historical Provider, MD  pantoprazole (PROTONIX) 40 MG tablet Take 1 tablet (40 mg total) by mouth daily. 02/07/13   Jerrye Noble, MD  promethazine (PHENERGAN) 25 MG tablet Take 1 tablet (25 mg total) by mouth every 6 (six) hours as needed. for nausea 03/20/14   Jerrye Noble, MD  Vitamin D, Ergocalciferol, (DRISDOL) 50000 UNITS CAPS capsule Take 50,000 Units by mouth every 7 (seven) days.  Every Sunday    Historical Provider, MD    Allergies  Ace inhibitors and Losartan  Triage Vitals: BP 139/98 mmHg  Pulse 97  Temp(Src) 98.4 F (36.9 C) (Oral)  Resp 18  Wt 158 lb 3.2 oz (71.759 kg)  SpO2 99%  Physical Exam  Constitutional: He is oriented to person, place, and time. He appears well-developed and well-nourished.  HENT:  Head: Normocephalic and atraumatic.  Eyes: Conjunctivae and EOM are normal.  Neck: Normal range of motion. Neck supple.  Cardiovascular: Normal rate, regular rhythm and normal heart sounds.   Pulmonary/Chest: Effort normal and breath sounds normal. No respiratory distress.  Abdominal: He exhibits no distension. There is tenderness in the epigastric area. There is no rebound and no guarding.  Musculoskeletal: Normal range of motion.  Neurological: He is alert and oriented to person, place, and time.  Skin: Skin is warm and dry.  Vitals reviewed.   ED Course  Procedures    COORDINATION OF CARE: 3:43 AM Discussed treatment plan which includes lab work and CT A/P with contrast with pt at bedside and pt agreed to plan.  Labs Review-  Labs Reviewed  LIPASE, BLOOD - Abnormal; Notable for the following:    Lipase 21 (*)    All other components within normal limits  COMPREHENSIVE METABOLIC PANEL - Abnormal; Notable for the following:    Potassium 3.1 (*)    Glucose, Bld 245 (*)    BUN <5 (*)    Calcium 8.2 (*)    Total Protein 6.3 (*)    Albumin 3.4 (*)    AST 100 (*)     ALT 78 (*)    Alkaline Phosphatase 151 (*)    All other components within normal limits  CBC - Abnormal; Notable for the following:    MCHC 36.2 (*)    All other components within normal limits  URINALYSIS, ROUTINE W REFLEX MICROSCOPIC (NOT AT [facility]) - Abnormal; Notable for the following:    Glucose, UA 500 (*)    All other components within normal limits  ETHANOL - Abnormal; Notable for the following:    Alcohol, Ethyl (B) 365 (*)    All other components within normal limits    Imaging Review Ct Abdomen Pelvis W Contrast  08/12/2014   CLINICAL DATA:  Chronic upper abdominal pain and bilateral flank pain. Bilateral leg pain. Initial encounter.  EXAM: CT ABDOMEN AND PELVIS WITH CONTRAST  TECHNIQUE: Multidetector CT imaging of the abdomen and pelvis was performed using the standard protocol following bolus administration of intravenous contrast.  CONTRAST:  184m OMNIPAQUE IOHEXOL 300 MG/ML  SOLN  COMPARISON:  MRCP performed 06/16/2011, and CT of the abdomen and pelvis from 11/13/2010  FINDINGS: The visualized lung bases are clear.  A 1.2 cm hypodensity within the right hepatic lobe likely reflects a small cyst. Fatty infiltration is noted within the left hepatic lobe and medial aspect of the right hepatic lobe. The spleen is unremarkable. The gallbladder is within normal limits.  There is dilatation of the pancreatic duct to 1.0 cm, with new diffuse calcification throughout the pancreas, likely reflecting sequelae of chronic pancreatitis. The pancreatic head is somewhat more bulky than on the prior study, thought to reflect calcification. An underlying mass cannot be excluded, but is not definitely seen.  The kidneys are unremarkable in appearance. There is no evidence of hydronephrosis. No renal or ureteral stones are seen. No perinephric stranding is appreciated.  No free fluid is identified. The small bowel is unremarkable  in appearance. The stomach is within normal limits. No acute vascular  abnormalities are seen. Scattered calcification is noted along the abdominal aorta and its branches.  The appendix is normal in caliber, without evidence of appendicitis. The colon is unremarkable in appearance.  The bladder is moderately distended and grossly unremarkable. The prostate is borderline normal in size. No inguinal lymphadenopathy is seen.  No acute osseous abnormalities are identified.  IMPRESSION: 1. Dilatation of the pancreatic duct to 1.0 cm, with new diffuse calcification throughout the pancreas, likely reflecting sequelae of chronic pancreatitis. The pancreatic head is somewhat more bulky than on the prior study, thought to reflect calcification. An underlying mass cannot be excluded, but is not definitely seen. Depending on the degree of clinical concern, MRCP could be considered for further evaluation. 2. Small hepatic cyst noted. Diffuse fatty infiltration within the liver. 3. Scattered calcification along the abdominal aorta and its branches.   Electronically Signed   By: Garald Balding M.D.   On: 08/12/2014 06:11    EKG Interpretation None      MDM   Final diagnoses:  Abdominal pain, chronic, generalized     59 y.o. male with pertinent PMH of chronic abd pain, etoh abuse presents with recurrent abd pain.  On arrival vitals and exam as above.  Wu unremarkable.  DC home in stable condition.    I have reviewed all laboratory and imaging studies if ordered as above  1. Abdominal pain, chronic, generalized          Michael Freiberg, MD 08/13/14 [phone]

## 2014-08-12 NOTE — Discharge Instructions (Signed)

## 2014-08-12 NOTE — ED Notes (Addendum)
Pt refusing to put BP cuff on or be hooked to monitor at this time.  Pt sts he is "restless" and can't just "sit around and do nothing".  This RN encouraged pt to stay in room, had therapeutic conversations and distracted pt.  This RN told pt she MUST remove IV before pt leaves, and told pt it is important to keep in for now in case of results from CT.

## 2014-08-12 NOTE — ED Notes (Signed)
Per ems-- pt reports generalized abdominal pain and L hip pain.

## 2014-08-12 NOTE — ED Notes (Signed)
Pt tearful in room.  Sts he does not understand why he has lost so much weight.  Pt sts "something is not right here".  This RN encouraged pt and thanking him for allowing [place] to care for him this evening.  Pt sts "I just can't take this pain in my legs anymore.  I wish I could just cut them off".  Pt sts he suffers from chronic arthritis.  Pt thanked Therapist, sports for therapeutic conversation and resting in room at this time.

## 2014-08-14 ENCOUNTER — Encounter (HOSPITAL_COMMUNITY): Payer: Self-pay

## 2014-08-14 ENCOUNTER — Emergency Department (HOSPITAL_COMMUNITY)
Admission: EM | Admit: 2014-08-14 | Discharge: 2014-08-14 | Disposition: A | Discharge status: Home / Self Care | Payer: Medicaid Other | Attending: Emergency Medicine | Admitting: Emergency Medicine

## 2014-08-14 DIAGNOSIS — W1839XA Other fall on same level, initial encounter: Secondary | ICD-10-CM | POA: Insufficient documentation

## 2014-08-14 DIAGNOSIS — I10 Essential (primary) hypertension: Secondary | ICD-10-CM | POA: Insufficient documentation

## 2014-08-14 DIAGNOSIS — Z794 Long term (current) use of insulin: Secondary | ICD-10-CM | POA: Insufficient documentation

## 2014-08-14 DIAGNOSIS — S3991XA Unspecified injury of abdomen, initial encounter: Secondary | ICD-10-CM | POA: Diagnosis present

## 2014-08-14 DIAGNOSIS — Z7982 Long term (current) use of aspirin: Secondary | ICD-10-CM | POA: Insufficient documentation

## 2014-08-14 DIAGNOSIS — Y9289 Other specified places as the place of occurrence of the external cause: Secondary | ICD-10-CM | POA: Diagnosis not present

## 2014-08-14 DIAGNOSIS — R109 Unspecified abdominal pain: Secondary | ICD-10-CM

## 2014-08-14 DIAGNOSIS — F329 Major depressive disorder, single episode, unspecified: Secondary | ICD-10-CM | POA: Insufficient documentation

## 2014-08-14 DIAGNOSIS — G8929 Other chronic pain: Secondary | ICD-10-CM | POA: Diagnosis not present

## 2014-08-14 DIAGNOSIS — Y998 Other external cause status: Secondary | ICD-10-CM | POA: Diagnosis not present

## 2014-08-14 DIAGNOSIS — G43909 Migraine, unspecified, not intractable, without status migrainosus: Secondary | ICD-10-CM | POA: Insufficient documentation

## 2014-08-14 DIAGNOSIS — Z720 Tobacco use: Secondary | ICD-10-CM | POA: Diagnosis not present

## 2014-08-14 DIAGNOSIS — Y9389 Activity, other specified: Secondary | ICD-10-CM | POA: Insufficient documentation

## 2014-08-14 DIAGNOSIS — Z79899 Other long term (current) drug therapy: Secondary | ICD-10-CM | POA: Insufficient documentation

## 2014-08-14 DIAGNOSIS — K219 Gastro-esophageal reflux disease without esophagitis: Secondary | ICD-10-CM | POA: Insufficient documentation

## 2014-08-14 DIAGNOSIS — Z86010 Personal history of colonic polyps: Secondary | ICD-10-CM | POA: Insufficient documentation

## 2014-08-14 DIAGNOSIS — E119 Type 2 diabetes mellitus without complications: Secondary | ICD-10-CM | POA: Diagnosis not present

## 2014-08-14 NOTE — ED Notes (Addendum)
Per EMS, pt fell on 07/18 was seen here and given pain meds and pt states "the pain meds are not working and I can't sleep" Pt was prescribed meloxicam. Pt complains of left arm/side/leg pain. Pt states that he has fallen 2 times since then. EMS VS 142/96, HR 108, RR 18, 97% on RA, CBG 124. Pt in NAD. Pt alert and oriented x 4. Pt ambulatory on scene.

## 2014-08-14 NOTE — Discharge Instructions (Signed)
Chronic Pain Michael [name], See a primary care doctor within 3 days for close follow up.  If symptoms worsen, come back to the ED immediately.  Thank you.    Chronic pain can be defined as pain that is off and on and lasts for 3-6 months or longer. Many things cause chronic pain, which can make it difficult to make a diagnosis. There are many treatment options available for chronic pain. However, finding a treatment that works well for you may require trying various approaches until the right one is found. Many people benefit from a combination of two or more types of treatment to control their pain. SYMPTOMS  Chronic pain can occur anywhere in the body and can range from mild to very severe. Some types of chronic pain include:  Headache.  Low back pain.  Cancer pain.  Arthritis pain.  Neurogenic pain. This is pain resulting from damage to nerves. People with chronic pain may also have other symptoms such as:  Depression.  Anger.  Insomnia.  Anxiety. DIAGNOSIS  Your health care provider will help diagnose your condition over time. In many cases, the initial focus will be on excluding possible conditions that could be causing the pain. Depending on your symptoms, your health care provider may order tests to diagnose your condition. Some of these tests may include:   Blood tests.   CT scan.   MRI.   X-rays.   Ultrasounds.   Nerve conduction studies.  You may need to see a specialist.  TREATMENT  Finding treatment that works well may take time. You may be referred to a pain specialist. He or she may prescribe medicine or therapies, such as:   Mindful meditation or yoga.  Shots (injections) of numbing or pain-relieving medicines into the spine or area of pain.  Local electrical stimulation.  Acupuncture.   Massage therapy.   Aroma, color, light, or sound therapy.   Biofeedback.   Working with a physical therapist to keep from getting stiff.   Regular,  gentle exercise.   Cognitive or behavioral therapy.   Group support.  Sometimes, surgery may be recommended.  HOME CARE INSTRUCTIONS   Take all medicines as directed by your health care provider.   Lessen stress in your life by relaxing and doing things such as listening to calming music.   Exercise or be active as directed by your health care provider.   Eat a healthy diet and include things such as vegetables, fruits, fish, and lean meats in your diet.   Keep all follow-up appointments with your health care provider.   Attend a support group with others suffering from chronic pain. SEEK MEDICAL CARE IF:   Your pain gets worse.   You develop a new pain that was not there before.   You cannot tolerate medicines given to you by your health care provider.   You have new symptoms since your last visit with your health care provider.  SEEK IMMEDIATE MEDICAL CARE IF:   You feel weak.   You have decreased sensation or numbness.   You lose control of bowel or bladder function.   Your pain suddenly gets much worse.   You develop shaking.  You develop chills.  You develop confusion.  You develop chest pain.  You develop shortness of breath.  MAKE SURE YOU:  Understand these instructions.  Will watch your condition.  Will get help right away if you are not doing well or get worse. Document Released: 10/01/2001 Document Revised: 09/11/2012  Document Reviewed: 07/05/2012 [facility] Patient Information 2015 [place], [place]. This information is not intended to replace advice given to you by your health care provider. Make sure you discuss any questions you have with your health care provider.

## 2014-08-14 NOTE — ED Notes (Signed)
Pt was discharged with all belongings and ambulated to waiting room.Marland Kitchen

## 2014-08-14 NOTE — ED Provider Notes (Signed)
CSN: [id number]     Arrival date & time 08/14/14  0321 History  This chart was scribed for Everlene Balls, MD by Evelene Croon, ED Scribe. This patient was seen in room B15C/B15C and the patient's care was started 4:08 AM.   Chief Complaint  Patient presents with  . Fall  . Pain    The history is provided by the patient. No language interpreter was used.     HPI Comments:  Michael [name] is a 59 y.o. male brought in by ambulance who presents to the Emergency Department complaining of pain to his left abdomen, which has been constant for the last 2 days but pt has been experiencing the same for years, since ~2008. Pt notes the pain radiates to his LLE. He was seen in the ED on 08/12/14 for the same abdominal pain; states he was discharged with meloxicam which he has been taking without relief.    Past Medical History  Diagnosis Date  . Chronic pancreatitis   . Smoker   . Gastritis due to alcohol without hemorrhage   . HTN (hypertension)   . Pancreatic lesion     appears innocent and related to chronic pancreatitis  . Heavy alcohol use     hx of  . Marijuana use     hx of  . Migraine   . Depression   . Helicobacter pylori gastritis 03/27/2011    on endoscopy, treated with omeprazole, clarithromycin and amoxicillin  . Insomnia   . ACE inhibitor-aggravated angioedema   . Sessile colonic polyp   . Diabetes mellitus   . GERD (gastroesophageal reflux disease)   . Chronic abdominal pain on narcotics 06/27/2012   Past Surgical History  Procedure Laterality Date  . Esophagogastroduodenoscopy  03/22/2011    Procedure: ESOPHAGOGASTRODUODENOSCOPY (EGD);  Surgeon: Gatha Mayer, MD;  Location: [facility];  Service: Endoscopy;  Laterality: N/A;  egd with balloon   . Balloon dilation  03/22/2011    Procedure: BALLOON DILATION;  Surgeon: Gatha Mayer, MD;  Location: [facility];  Service: Endoscopy;  Laterality: N/A;  . Eus  04/20/2011    Procedure: UPPER ENDOSCOPIC ULTRASOUND (EUS) LINEAR;   Surgeon: Milus Banister, MD;  Location: [facility];  Service: Endoscopy;  Laterality: N/A;  . Colonoscopy w/ biopsies and polypectomy  09/12/11  . Colonoscopy N/A 05/22/2012    Procedure: COLONOSCOPY;  Surgeon: Gatha Mayer, MD;  Location: [facility];  Service: Endoscopy;  Laterality: N/A;   Family History  Problem Relation Age of Onset  . Colon cancer Neg Hx   . Stomach cancer Neg Hx   . Anesthesia problems Neg Hx   . Hypotension Neg Hx   . Pseudochol deficiency Neg Hx   . Malignant hyperthermia Neg Hx   . Hypertension Sister   . Diabetes Sister   . Heart disease Sister    History  Substance Use Topics  . Smoking status: Current Every Day Smoker -- 0.60 packs/day for 30 years    Types: Cigarettes  . Smokeless tobacco: Never Used     Comment: 4 -5 cigs/day.Wants to quit. Up to 7-8 cigs per day  . Alcohol Use: No     Comment: sobriety since 2/13,was drinking 7-10 beer/daily    Review of Systems  A complete 10 system review of systems was obtained and all systems are negative except as noted in the HPI and PMH.     Allergies  Ace inhibitors and Losartan  Home Medications   Prior to Admission  medications   Medication Sig Start Date End Date Taking? Authorizing Provider  ACCU-CHEK AVIVA PLUS test strip check blood sugar THREE TIMES DAILY BEFORE MEALS AND AT BEDTIME 12/16/13   Jerrye Noble, MD  ARIPiprazole (ABILIFY) 15 MG tablet Take 15 mg by mouth at bedtime. Takes with 5 mg and 2 mg for a 22 mg dose    Historical Provider, MD  ARIPiprazole (ABILIFY) 2 MG tablet Take 2 mg by mouth at bedtime. Takes with 15 mg and 5 mg for a 22 mg dose    Historical Provider, MD  ARIPiprazole (ABILIFY) 5 MG tablet Take 5 mg by mouth at bedtime. Takes with 15 mg and 2 mg for a 22 mg dose    Historical Provider, MD  aspirin EC 81 MG tablet Take 81 mg by mouth at bedtime.    Historical Provider, MD  B-D UF III MINI PEN NEEDLES 31G X 5 MM MISC USE TO INJECT insulin THREE TIMES DAILY 04/13/14    Jerrye Noble, MD  Blood Glucose Monitoring Suppl (ACCU-CHEK NANO SMARTVIEW) W/DEVICE KIT 1 each by Does not apply route 3 (three) times daily. Dx code 250.00 insulin requiring 10/07/12   Dominic Pea, DO  FLUoxetine (PROZAC) 20 MG capsule Take 20 mg by mouth 2 (two) times daily.     Historical Provider, MD  glipiZIDE (GLUCOTROL) 10 MG tablet Take 10 mg by mouth 2 (two) times daily before a meal. 07/22/12   Jerrye Noble, MD  glucose blood (ACCU-CHEK SMARTVIEW) test strip Check blood sugar 3 times a day before meals and bedtime Dx code E11.65 insulin requiring 02/11/14   Jerrye Noble, MD  glucose blood (AGAMATRIX PRESTO TEST) test strip Use as instructed 12/11/13   Jerrye Noble, MD  hydrochlorothiazide (HYDRODIURIL) 25 MG tablet Take 1 tablet (25 mg total) by mouth daily. 05/14/14   Jerrye Noble, MD  HYDROcodone-acetaminophen (NORCO/VICODIN) 5-325 MG per tablet Take 1 tablet by mouth every 6 (six) hours as needed. 07/30/14   Montine Circle, PA-C  insulin aspart (NOVOLOG) 100 UNIT/ML injection Inject 20 Units into the skin 2 (two) times daily.    Historical Provider, MD  Insulin Glargine (LANTUS SOLOSTAR) 100 UNIT/ML Solostar Pen Inject 0.65 mLs (65 Units total) into the skin at bedtime 03/20/14   Jerrye Noble, MD  lovastatin (MEVACOR) 40 MG tablet Take 1 tablet (40 mg total) by mouth at bedtime. 03/20/14   Jerrye Noble, MD  meloxicam (MOBIC) 7.5 MG tablet Take 1 tablet (7.5 mg total) by mouth daily. 05/14/14   Jerrye Noble, MD  metFORMIN (GLUCOPHAGE) 500 MG tablet Take 1,000 mg by mouth 2 (two) times daily with a meal.    Historical Provider, MD  ondansetron (ZOFRAN) 4 MG tablet Take 1 tablet (4 mg total) by mouth every 6 (six) hours. 05/14/14   Jerrye Noble, MD  Pancrelipase, Lip-Prot-Amyl, (CREON) 36000 UNITS CPEP Take 36,000 Units by mouth 3 (three) times daily.    Historical Provider, MD  pantoprazole (PROTONIX) 40 MG tablet Take 1 tablet (40 mg total) by mouth daily. 02/07/13   Jerrye Noble, MD   promethazine (PHENERGAN) 25 MG tablet Take 1 tablet (25 mg total) by mouth every 6 (six) hours as needed. for nausea 03/20/14   Jerrye Noble, MD  Vitamin D, Ergocalciferol, (DRISDOL) 50000 UNITS CAPS capsule Take 50,000 Units by mouth every 7 (seven) days. Every Sunday    Historical Provider, MD   BP 146/90 mmHg  Pulse 94  Temp(Src) 98 F (36.7 C) (Oral)  Ht $R'5\' 3"'zc$  (1.6 m)  Wt 158 lb (71.668 kg)  BMI 28.00 kg/m2  SpO2 99% Physical Exam  Constitutional: He is oriented to person, place, and time. Vital signs are normal. He appears well-developed and well-nourished.  Non-toxic appearance. He does not appear ill. No distress.  HENT:  Head: Normocephalic and atraumatic.  Nose: Nose normal.  Mouth/Throat: Oropharynx is clear and moist. No oropharyngeal exudate.  Eyes: Conjunctivae and EOM are normal. Pupils are equal, round, and reactive to light. No scleral icterus.  Neck: Normal range of motion. Neck supple. No tracheal deviation, no edema, no erythema and normal range of motion present. No thyroid mass and no thyromegaly present.  Cardiovascular: Normal rate, regular rhythm, S1 normal, S2 normal, normal heart sounds, intact distal pulses and normal pulses.  Exam reveals no gallop and no friction rub.   No murmur heard. Pulses:      Radial pulses are 2+ on the right side, and 2+ on the left side.       Dorsalis pedis pulses are 2+ on the right side, and 2+ on the left side.  Pulmonary/Chest: Effort normal and breath sounds normal. No respiratory distress. He has no wheezes. He has no rhonchi. He has no rales.  Abdominal: Soft. Normal appearance and bowel sounds are normal. He exhibits no distension, no ascites and no mass. There is no hepatosplenomegaly. There is no tenderness. There is no rebound, no guarding and no CVA tenderness.  Musculoskeletal: Normal range of motion. He exhibits no edema or tenderness.  Lymphadenopathy:    He has no cervical adenopathy.  Neurological: He is alert and  oriented to person, place, and time. He has normal strength. No cranial nerve deficit or sensory deficit. Coordination abnormal.  Skin: Skin is warm, dry and intact. No petechiae and no rash noted. He is not diaphoretic. No erythema. No pallor.  Psychiatric: He has a normal mood and affect. His behavior is normal. Judgment normal.  Nursing note and vitals reviewed.   ED Course  Procedures   DIAGNOSTIC STUDIES:  Oxygen Saturation is 99% on RA, normal by my interpretation.    COORDINATION OF CARE:  4:09 AM Discussed treatment plan with pt at bedside and pt agreed to plan.  Labs Review Labs Reviewed - No data to display  Imaging Review Ct Abdomen Pelvis W Contrast  08/12/2014   CLINICAL DATA:  Chronic upper abdominal pain and bilateral flank pain. Bilateral leg pain. Initial encounter.  EXAM: CT ABDOMEN AND PELVIS WITH CONTRAST  TECHNIQUE: Multidetector CT imaging of the abdomen and pelvis was performed using the standard protocol following bolus administration of intravenous contrast.  CONTRAST:  157mL OMNIPAQUE IOHEXOL 300 MG/ML  SOLN  COMPARISON:  MRCP performed 06/16/2011, and CT of the abdomen and pelvis from 11/13/2010  FINDINGS: The visualized lung bases are clear.  A 1.2 cm hypodensity within the right hepatic lobe likely reflects a small cyst. Fatty infiltration is noted within the left hepatic lobe and medial aspect of the right hepatic lobe. The spleen is unremarkable. The gallbladder is within normal limits.  There is dilatation of the pancreatic duct to 1.0 cm, with new diffuse calcification throughout the pancreas, likely reflecting sequelae of chronic pancreatitis. The pancreatic head is somewhat more bulky than on the prior study, thought to reflect calcification. An underlying mass cannot be excluded, but is not definitely seen.  The kidneys are unremarkable in appearance. There is no evidence of hydronephrosis. No renal or  ureteral stones are seen. No perinephric stranding is  appreciated.  No free fluid is identified. The small bowel is unremarkable in appearance. The stomach is within normal limits. No acute vascular abnormalities are seen. Scattered calcification is noted along the abdominal aorta and its branches.  The appendix is normal in caliber, without evidence of appendicitis. The colon is unremarkable in appearance.  The bladder is moderately distended and grossly unremarkable. The prostate is borderline normal in size. No inguinal lymphadenopathy is seen.  No acute osseous abnormalities are identified.  IMPRESSION: 1. Dilatation of the pancreatic duct to 1.0 cm, with new diffuse calcification throughout the pancreas, likely reflecting sequelae of chronic pancreatitis. The pancreatic head is somewhat more bulky than on the prior study, thought to reflect calcification. An underlying mass cannot be excluded, but is not definitely seen. Depending on the degree of clinical concern, MRCP could be considered for further evaluation. 2. Small hepatic cyst noted. Diffuse fatty infiltration within the liver. 3. Scattered calcification along the abdominal aorta and its branches.   Electronically Signed   By: Garald Balding M.D.   On: 08/12/2014 06:11     EKG Interpretation None      MDM   Final diagnoses:  None   Patient presents to the ED for chronic abdominal pain.  He was seen 2 days ago for his chronic pain and CT scan was normal.  He states this is the exact same pain he has had for years.  I do not believe a work up is indicated.  He was instructed to follow up with PCP within 3 days for close follow up.  Return precautions given.  He appears well, sleeping in bed and in NAD.  His VS remain within his normal limits and he is safe for Dc.  I personally performed the services described in this documentation, which was scribed in my presence. The recorded information has been reviewed and is accurate.   Everlene Balls, MD 08/14/14 [id number]

## 2014-08-14 NOTE — ED Notes (Signed)
Pt now sleeping in bed

## 2014-08-17 ENCOUNTER — Other Ambulatory Visit: Payer: Self-pay | Admitting: Internal Medicine

## 2014-08-17 ENCOUNTER — Encounter (HOSPITAL_COMMUNITY): Payer: Self-pay | Admitting: Emergency Medicine

## 2014-08-17 ENCOUNTER — Emergency Department (HOSPITAL_COMMUNITY)
Admission: EM | Admit: 2014-08-17 | Discharge: 2014-08-17 | Disposition: A | Discharge status: Home / Self Care | Payer: Medicaid Other | Attending: Emergency Medicine | Admitting: Emergency Medicine

## 2014-08-17 DIAGNOSIS — Z7982 Long term (current) use of aspirin: Secondary | ICD-10-CM | POA: Diagnosis not present

## 2014-08-17 DIAGNOSIS — Z794 Long term (current) use of insulin: Secondary | ICD-10-CM | POA: Insufficient documentation

## 2014-08-17 DIAGNOSIS — G8929 Other chronic pain: Secondary | ICD-10-CM | POA: Insufficient documentation

## 2014-08-17 DIAGNOSIS — F419 Anxiety disorder, unspecified: Secondary | ICD-10-CM | POA: Diagnosis not present

## 2014-08-17 DIAGNOSIS — Z720 Tobacco use: Secondary | ICD-10-CM | POA: Insufficient documentation

## 2014-08-17 DIAGNOSIS — K219 Gastro-esophageal reflux disease without esophagitis: Secondary | ICD-10-CM | POA: Diagnosis not present

## 2014-08-17 DIAGNOSIS — Z79899 Other long term (current) drug therapy: Secondary | ICD-10-CM | POA: Insufficient documentation

## 2014-08-17 DIAGNOSIS — Z8619 Personal history of other infectious and parasitic diseases: Secondary | ICD-10-CM | POA: Insufficient documentation

## 2014-08-17 DIAGNOSIS — I10 Essential (primary) hypertension: Secondary | ICD-10-CM | POA: Diagnosis not present

## 2014-08-17 DIAGNOSIS — Z86010 Personal history of colonic polyps: Secondary | ICD-10-CM | POA: Diagnosis not present

## 2014-08-17 DIAGNOSIS — K861 Other chronic pancreatitis: Secondary | ICD-10-CM | POA: Insufficient documentation

## 2014-08-17 DIAGNOSIS — E162 Hypoglycemia, unspecified: Secondary | ICD-10-CM

## 2014-08-17 DIAGNOSIS — E11649 Type 2 diabetes mellitus with hypoglycemia without coma: Secondary | ICD-10-CM | POA: Diagnosis not present

## 2014-08-17 DIAGNOSIS — Z791 Long term (current) use of non-steroidal anti-inflammatories (NSAID): Secondary | ICD-10-CM | POA: Insufficient documentation

## 2014-08-17 DIAGNOSIS — F329 Major depressive disorder, single episode, unspecified: Secondary | ICD-10-CM | POA: Insufficient documentation

## 2014-08-17 LAB — CBG MONITORING, ED: GLUCOSE-CAPILLARY: 85 mg/dL (ref 65–99)

## 2014-08-17 MED ORDER — KETOROLAC TROMETHAMINE 30 MG/ML IJ SOLN
30.0000 mg | Freq: Once | INTRAMUSCULAR | Status: AC
  Administered 2014-08-17: 30 mg via INTRAMUSCULAR
  Filled 2014-08-17: qty 1

## 2014-08-17 NOTE — ED Notes (Signed)
Pt ambulating in room.  Gait steady and even.  Pt able to use the restroom on his own.

## 2014-08-17 NOTE — ED Notes (Signed)
MD at bedside. 

## 2014-08-17 NOTE — Discharge Instructions (Signed)
As discussed, it is important that you follow up as soon as possible with your physician for continued management of your condition. ° °If you develop any new, or concerning changes in your condition, please return to the emergency department immediately. ° °

## 2014-08-17 NOTE — ED Notes (Signed)
Per EMS:  Arrived at pt's home around 2028 and pt was diaphoretic, confused, and giggling.  EMS checked sugar and it was 28.  Pt took his home dose of oral glucose paste.  CBG up to 58 en route.  Pt adamantly refusing IV and did not want to be brought to the ED.  Pt alert and oriented in room at this time.  Arrival CBG - 38.  MD made aware.  This RN attempted to reason with pt for IV.  Pt continues to refuse. Pt provided with OJ.

## 2014-08-17 NOTE — ED Provider Notes (Signed)
CSN: [id number]     Arrival date & time 08/17/14  2106 History   First MD Initiated Contact with Patient 08/17/14 2103     Chief Complaint  Patient presents with  . Hypoglycemia     (Consider location/radiation/quality/duration/timing/severity/associated sxs/prior Treatment) Patient is a 59 y.o. male presenting with hypoglycemia.  Hypoglycemia Associated symptoms: no vomiting    patient presents after EMS was called to his house. Per report patient was hyperglycemic on their arrival, but after taking his home medication, glucose improved. Patient was noted to be diaphoretic, confused on EMS arrival, though this improved as well. Patient attempted to refuse ED evaluation, but was brought here for evaluation. Patient states that he has pain all over, unchanged, otherwise no new pain, no new complaints. Patient states that he understands concern for hyperglycemia, would like to drink juice. He denies other concerns.   Past Medical History  Diagnosis Date  . Chronic pancreatitis   . Smoker   . Gastritis due to alcohol without hemorrhage   . HTN (hypertension)   . Pancreatic lesion     appears innocent and related to chronic pancreatitis  . Heavy alcohol use     hx of  . Marijuana use     hx of  . Migraine   . Depression   . Helicobacter pylori gastritis 03/27/2011    on endoscopy, treated with omeprazole, clarithromycin and amoxicillin  . Insomnia   . ACE inhibitor-aggravated angioedema   . Sessile colonic polyp   . Diabetes mellitus   . GERD (gastroesophageal reflux disease)   . Chronic abdominal pain on narcotics 06/27/2012   Past Surgical History  Procedure Laterality Date  . Esophagogastroduodenoscopy  03/22/2011    Procedure: ESOPHAGOGASTRODUODENOSCOPY (EGD);  Surgeon: Gatha Mayer, MD;  Location: [facility];  Service: Endoscopy;  Laterality: N/A;  egd with balloon   . Balloon dilation  03/22/2011    Procedure: BALLOON DILATION;  Surgeon: Gatha Mayer, MD;   Location: [facility];  Service: Endoscopy;  Laterality: N/A;  . Eus  04/20/2011    Procedure: UPPER ENDOSCOPIC ULTRASOUND (EUS) LINEAR;  Surgeon: Milus Banister, MD;  Location: [facility];  Service: Endoscopy;  Laterality: N/A;  . Colonoscopy w/ biopsies and polypectomy  09/12/11  . Colonoscopy N/A 05/22/2012    Procedure: COLONOSCOPY;  Surgeon: Gatha Mayer, MD;  Location: [facility];  Service: Endoscopy;  Laterality: N/A;   Family History  Problem Relation Age of Onset  . Colon cancer Neg Hx   . Stomach cancer Neg Hx   . Anesthesia problems Neg Hx   . Hypotension Neg Hx   . Pseudochol deficiency Neg Hx   . Malignant hyperthermia Neg Hx   . Hypertension Sister   . Diabetes Sister   . Heart disease Sister    History  Substance Use Topics  . Smoking status: Current Every Day Smoker -- 0.60 packs/day for 30 years    Types: Cigarettes  . Smokeless tobacco: Never Used     Comment: 4 -5 cigs/day.Wants to quit. Up to 7-8 cigs per day  . Alcohol Use: No     Comment: sobriety since 2/13,was drinking 7-10 beer/daily    Review of Systems  Constitutional:       Per HPI, otherwise negative  HENT:       Per HPI, otherwise negative  Respiratory:       Per HPI, otherwise negative  Cardiovascular:       Per HPI, otherwise negative  Gastrointestinal: Negative for  vomiting.  Endocrine:       Negative aside from HPI  Genitourinary:       Neg aside from HPI   Musculoskeletal:       Chronic pain, diffuse, no changes.  Skin: Negative.  Negative for wound.  Neurological: Negative for syncope.      Allergies  Ace inhibitors and Losartan  Home Medications   Prior to Admission medications   Medication Sig Start Date End Date Taking? Authorizing Provider  ACCU-CHEK AVIVA PLUS test strip check blood sugar THREE TIMES DAILY BEFORE MEALS AND AT BEDTIME 12/16/13   Jerrye Noble, MD  ARIPiprazole (ABILIFY) 15 MG tablet Take 15 mg by mouth at bedtime. Takes with 5 mg and 2 mg for a 22  mg dose    Historical Provider, MD  ARIPiprazole (ABILIFY) 2 MG tablet Take 2 mg by mouth at bedtime. Takes with 15 mg and 5 mg for a 22 mg dose    Historical Provider, MD  ARIPiprazole (ABILIFY) 5 MG tablet Take 5 mg by mouth at bedtime. Takes with 15 mg and 2 mg for a 22 mg dose    Historical Provider, MD  aspirin EC 81 MG tablet Take 81 mg by mouth at bedtime.    Historical Provider, MD  B-D UF III MINI PEN NEEDLES 31G X 5 MM MISC USE TO INJECT insulin THREE TIMES DAILY 04/13/14   Jerrye Noble, MD  Blood Glucose Monitoring Suppl (ACCU-CHEK NANO SMARTVIEW) W/DEVICE KIT 1 each by Does not apply route 3 (three) times daily. Dx code 250.00 insulin requiring 10/07/12   Dominic Pea, DO  FLUoxetine (PROZAC) 20 MG capsule Take 20 mg by mouth 2 (two) times daily.     Historical Provider, MD  glipiZIDE (GLUCOTROL) 10 MG tablet Take 10 mg by mouth 2 (two) times daily before a meal. 07/22/12   Jerrye Noble, MD  glucose blood (ACCU-CHEK SMARTVIEW) test strip Check blood sugar 3 times a day before meals and bedtime Dx code E11.65 insulin requiring 02/11/14   Jerrye Noble, MD  glucose blood (AGAMATRIX PRESTO TEST) test strip Use as instructed 12/11/13   Jerrye Noble, MD  hydrochlorothiazide (HYDRODIURIL) 25 MG tablet Take 1 tablet (25 mg total) by mouth daily. 05/14/14   Jerrye Noble, MD  HYDROcodone-acetaminophen (NORCO/VICODIN) 5-325 MG per tablet Take 1 tablet by mouth every 6 (six) hours as needed. 07/30/14   Montine Circle, PA-C  insulin aspart (NOVOLOG) 100 UNIT/ML injection Inject 20 Units into the skin 2 (two) times daily.    Historical Provider, MD  Insulin Glargine (LANTUS SOLOSTAR) 100 UNIT/ML Solostar Pen Inject 0.65 mLs (65 Units total) into the skin at bedtime 03/20/14   Jerrye Noble, MD  lovastatin (MEVACOR) 40 MG tablet Take 1 tablet (40 mg total) by mouth at bedtime. 03/20/14   Jerrye Noble, MD  meloxicam (MOBIC) 7.5 MG tablet Take 1 tablet (7.5 mg total) by mouth daily. 05/14/14   Jerrye Noble, MD   metFORMIN (GLUCOPHAGE) 500 MG tablet Take 1,000 mg by mouth 2 (two) times daily with a meal.    Historical Provider, MD  ondansetron (ZOFRAN) 4 MG tablet Take 1 tablet (4 mg total) by mouth every 6 (six) hours. 05/14/14   Jerrye Noble, MD  Pancrelipase, Lip-Prot-Amyl, (CREON) 36000 UNITS CPEP Take 36,000 Units by mouth 3 (three) times daily.    Historical Provider, MD  pantoprazole (PROTONIX) 40 MG tablet Take 1 tablet (40 mg total) by mouth daily.  02/07/13   Jerrye Noble, MD  promethazine (PHENERGAN) 25 MG tablet Take 1 tablet (25 mg total) by mouth every 6 (six) hours as needed. for nausea 03/20/14   Jerrye Noble, MD  Vitamin D, Ergocalciferol, (DRISDOL) 50000 UNITS CAPS capsule Take 50,000 Units by mouth every 7 (seven) days. Every Sunday    Historical Provider, MD   BP 115/84 mmHg  Pulse 110  Resp 18  SpO2 100% Physical Exam  Constitutional: He is oriented to person, place, and time. He appears well-developed. No distress.  HENT:  Head: Normocephalic and atraumatic.  Eyes: Conjunctivae and EOM are normal.  Cardiovascular: Normal rate and regular rhythm.   Pulmonary/Chest: Effort normal. No stridor. No respiratory distress.  Abdominal: He exhibits no distension.  Musculoskeletal: He exhibits no edema.  Neurological: He is alert and oriented to person, place, and time.  Skin: Skin is warm and dry.  Psychiatric: His mood appears anxious.  Nursing note and vitals reviewed.   ED Course  Procedures (including critical care time)  Chart review demonstrates 6 visits in 6 months, including a visit here several days ago for fall, pain.   Patient requests discharge.  Patient has capacity to make this request. As he is in no distress, tolerating oral rehydration, food, with no new complaints, no evidence for acute new   MDM    patient presents after being found to be hypoglycemic after EMS was called due to confusion. Here patient is awake, alert, oriented appropriately, tolerating oral  rehydration, food. Patient has no evidence for other new acute conditions, vitals are stable, patient discharged.    Carmin Muskrat, MD 08/17/14 2134

## 2014-08-17 NOTE — ED Notes (Signed)
Pt requesting to go home.  MD made aware.  Pt sts "I can get my own sugar up at home".  CBG rechecked, CBG 84.    Pt given a [place] sandwich baggie.  Pt still requesting to go home, discharge placed.

## 2014-08-18 LAB — CBG MONITORING, ED: Glucose-Capillary: 38 mg/dL — CL (ref 65–99)

## 2014-08-20 ENCOUNTER — Encounter: Payer: Self-pay | Admitting: Licensed Clinical Social Worker

## 2014-08-20 ENCOUNTER — Telehealth: Payer: Self-pay | Admitting: Licensed Clinical Social Worker

## 2014-08-20 NOTE — Telephone Encounter (Signed)
Michael [name] placed call to CSW this morning. Michael [name] informed this worker that he has had several ED visit of the past two weeks.  Michael [name] stating he had low blood sugar of 23, but was given food to help increase.  Michael [name] has not met with CDE recently, CSW encouraged Michael [name] to attempt to schedule with CDE when he is here for his next office visit.  Michael [name] voiced complaints of difficulty walking, stating his knees have been giving out on him, and increased pain with his arthritis.  Michael [name] states he is seeing Michael [name] within the next 2 weeks.  Michael [name] requesting the phone number to schedule Medicaid Medical transportation and to obtain the date and time his [facility] appointment.  CSW provided information requested.  Michael [name] denies add'l social work needs at this time.  Michael [name] aware CSW is available to assist as needed.

## 2014-08-20 NOTE — Telephone Encounter (Signed)
A user error has taken place: encounter opened in error, closed for administrative reasons.

## 2014-08-21 ENCOUNTER — Other Ambulatory Visit: Payer: Self-pay | Admitting: *Deleted

## 2014-08-21 MED ORDER — INSULIN PEN NEEDLE 31G X 5 MM MISC: Status: DC

## 2014-08-27 ENCOUNTER — Encounter: Payer: Self-pay | Admitting: Internal Medicine

## 2014-08-28 ENCOUNTER — Encounter: Payer: Medicaid Other | Admitting: Internal Medicine

## 2014-08-28 ENCOUNTER — Encounter: Payer: Self-pay | Admitting: Internal Medicine

## 2014-09-08 ENCOUNTER — Encounter (HOSPITAL_COMMUNITY): Payer: Self-pay | Admitting: *Deleted

## 2014-09-08 ENCOUNTER — Observation Stay (HOSPITAL_COMMUNITY)
Admission: EM | Admit: 2014-09-08 | Discharge: 2014-09-09 | Disposition: A | Discharge status: Home / Self Care | Payer: Medicaid Other | Attending: Student in an Organized Health Care Education/Training Program | Admitting: Student in an Organized Health Care Education/Training Program

## 2014-09-08 DIAGNOSIS — F319 Bipolar disorder, unspecified: Secondary | ICD-10-CM | POA: Insufficient documentation

## 2014-09-08 DIAGNOSIS — G4089 Other seizures: Secondary | ICD-10-CM | POA: Diagnosis not present

## 2014-09-08 DIAGNOSIS — E1165 Type 2 diabetes mellitus with hyperglycemia: Secondary | ICD-10-CM

## 2014-09-08 DIAGNOSIS — Y900 Blood alcohol level of less than 20 mg/100 ml: Secondary | ICD-10-CM | POA: Diagnosis not present

## 2014-09-08 DIAGNOSIS — Z794 Long term (current) use of insulin: Secondary | ICD-10-CM | POA: Diagnosis not present

## 2014-09-08 DIAGNOSIS — E162 Hypoglycemia, unspecified: Secondary | ICD-10-CM | POA: Diagnosis present

## 2014-09-08 DIAGNOSIS — Z79899 Other long term (current) drug therapy: Secondary | ICD-10-CM | POA: Insufficient documentation

## 2014-09-08 DIAGNOSIS — E1142 Type 2 diabetes mellitus with diabetic polyneuropathy: Secondary | ICD-10-CM | POA: Diagnosis present

## 2014-09-08 DIAGNOSIS — T383X5A Adverse effect of insulin and oral hypoglycemic [antidiabetic] drugs, initial encounter: Secondary | ICD-10-CM | POA: Insufficient documentation

## 2014-09-08 DIAGNOSIS — I10 Essential (primary) hypertension: Secondary | ICD-10-CM | POA: Insufficient documentation

## 2014-09-08 DIAGNOSIS — K860 Alcohol-induced chronic pancreatitis: Secondary | ICD-10-CM | POA: Diagnosis not present

## 2014-09-08 DIAGNOSIS — G8929 Other chronic pain: Secondary | ICD-10-CM | POA: Diagnosis not present

## 2014-09-08 DIAGNOSIS — R55 Syncope and collapse: Secondary | ICD-10-CM | POA: Diagnosis not present

## 2014-09-08 DIAGNOSIS — F1290 Cannabis use, unspecified, uncomplicated: Secondary | ICD-10-CM | POA: Diagnosis not present

## 2014-09-08 DIAGNOSIS — Z888 Allergy status to other drugs, medicaments and biological substances status: Secondary | ICD-10-CM | POA: Insufficient documentation

## 2014-09-08 DIAGNOSIS — E11649 Type 2 diabetes mellitus with hypoglycemia without coma: Secondary | ICD-10-CM | POA: Diagnosis not present

## 2014-09-08 DIAGNOSIS — R569 Unspecified convulsions: Secondary | ICD-10-CM | POA: Insufficient documentation

## 2014-09-08 DIAGNOSIS — K868 Other specified diseases of pancreas: Secondary | ICD-10-CM

## 2014-09-08 DIAGNOSIS — F17210 Nicotine dependence, cigarettes, uncomplicated: Secondary | ICD-10-CM | POA: Insufficient documentation

## 2014-09-08 DIAGNOSIS — K219 Gastro-esophageal reflux disease without esophagitis: Secondary | ICD-10-CM | POA: Diagnosis not present

## 2014-09-08 DIAGNOSIS — E876 Hypokalemia: Secondary | ICD-10-CM | POA: Insufficient documentation

## 2014-09-08 DIAGNOSIS — Z7982 Long term (current) use of aspirin: Secondary | ICD-10-CM | POA: Insufficient documentation

## 2014-09-08 DIAGNOSIS — F1020 Alcohol dependence, uncomplicated: Secondary | ICD-10-CM | POA: Insufficient documentation

## 2014-09-08 DIAGNOSIS — IMO0002 Reserved for concepts with insufficient information to code with codable children: Secondary | ICD-10-CM

## 2014-09-08 DIAGNOSIS — Z590 Homelessness: Secondary | ICD-10-CM | POA: Diagnosis not present

## 2014-09-08 DIAGNOSIS — G4700 Insomnia, unspecified: Secondary | ICD-10-CM | POA: Insufficient documentation

## 2014-09-08 DIAGNOSIS — R109 Unspecified abdominal pain: Secondary | ICD-10-CM | POA: Diagnosis not present

## 2014-09-08 DIAGNOSIS — G43909 Migraine, unspecified, not intractable, without status migrainosus: Secondary | ICD-10-CM | POA: Insufficient documentation

## 2014-09-08 HISTORY — DX: Unspecified convulsions: R56.9

## 2014-09-08 LAB — COMPREHENSIVE METABOLIC PANEL
ALBUMIN: 3.1 g/dL — AB (ref 3.5–5.0)
ALT: 29 U/L (ref 17–63)
ANION GAP: 13 (ref 5–15)
AST: 66 U/L — ABNORMAL HIGH (ref 15–41)
Alkaline Phosphatase: 89 U/L (ref 38–126)
BILIRUBIN TOTAL: 0.4 mg/dL (ref 0.3–1.2)
BUN: 8 mg/dL (ref 6–20)
CO2: 23 mmol/L (ref 22–32)
Calcium: 8.4 mg/dL — ABNORMAL LOW (ref 8.9–10.3)
Chloride: 106 mmol/L (ref 101–111)
Creatinine, Ser: 0.85 mg/dL (ref 0.61–1.24)
GFR calc Af Amer: >60 mL/min (ref >60)
GFR calc non Af Amer: >60 mL/min (ref >60)
GLUCOSE: 80 mg/dL (ref 65–99)
POTASSIUM: 2.9 mmol/L — AB (ref 3.5–5.1)
Sodium: 142 mmol/L (ref 135–145)
TOTAL PROTEIN: 5.5 g/dL — AB (ref 6.5–8.1)

## 2014-09-08 LAB — CBC
HCT: 41.4 % (ref 39.0–52.0)
Hemoglobin: 14.3 g/dL (ref 13.0–17.0)
MCH: 34.2 pg — AB (ref 26.0–34.0)
MCHC: 34.5 g/dL (ref 30.0–36.0)
MCV: 99 fL (ref 78.0–100.0)
PLATELETS: 167 10*3/uL (ref 150–400)
RBC: 4.18 MIL/uL — AB (ref 4.22–5.81)
RDW: 15.8 % — ABNORMAL HIGH (ref 11.5–15.5)
WBC: 8.6 10*3/uL (ref 4.0–10.5)

## 2014-09-08 LAB — CBG MONITORING, ED
GLUCOSE-CAPILLARY: 38 mg/dL — AB (ref 65–99)
GLUCOSE-CAPILLARY: 66 mg/dL (ref 65–99)
GLUCOSE-CAPILLARY: 35 mg/dL — AB (ref 65–99)
GLUCOSE-CAPILLARY: 56 mg/dL — AB (ref 65–99)
GLUCOSE-CAPILLARY: 62 mg/dL — AB (ref 65–99)
GLUCOSE-CAPILLARY: 70 mg/dL (ref 65–99)
Glucose-Capillary: 82 mg/dL (ref 65–99)
Glucose-Capillary: 87 mg/dL (ref 65–99)
Glucose-Capillary: 43 mg/dL — CL (ref 65–99)

## 2014-09-08 LAB — GLUCOSE, CAPILLARY
GLUCOSE-CAPILLARY: 93 mg/dL (ref 65–99)
Glucose-Capillary: 52 mg/dL — ABNORMAL LOW (ref 65–99)
Glucose-Capillary: 175 mg/dL — ABNORMAL HIGH (ref 65–99)
Glucose-Capillary: 224 mg/dL — ABNORMAL HIGH (ref 65–99)

## 2014-09-08 LAB — BASIC METABOLIC PANEL
Anion gap: 9 (ref 5–15)
BUN: 7 mg/dL (ref 6–20)
CALCIUM: 8.3 mg/dL — AB (ref 8.9–10.3)
CO2: 26 mmol/L (ref 22–32)
CREATININE: 0.66 mg/dL (ref 0.61–1.24)
Chloride: 105 mmol/L (ref 101–111)
GFR calc non Af Amer: >60 mL/min (ref >60)
GLUCOSE: 55 mg/dL — AB (ref 65–99)
Potassium: 3 mmol/L — ABNORMAL LOW (ref 3.5–5.1)
Sodium: 140 mmol/L (ref 135–145)

## 2014-09-08 LAB — ETHANOL

## 2014-09-08 LAB — MAGNESIUM: MAGNESIUM: 1.6 mg/dL — AB (ref 1.7–2.4)

## 2014-09-08 MED ORDER — TRAMADOL HCL 50 MG PO TABS
50.0000 mg | ORAL_TABLET | Freq: Four times a day (QID) | ORAL | Status: DC | PRN
  Administered 2014-09-08 – 2014-09-09 (×2): 50 mg via ORAL
  Filled 2014-09-08 (×2): qty 1

## 2014-09-08 MED ORDER — ARIPIPRAZOLE 10 MG PO TABS
20.0000 mg | ORAL_TABLET | Freq: Every day | ORAL | Status: DC
  Filled 2014-09-08: qty 2

## 2014-09-08 MED ORDER — POTASSIUM CHLORIDE CRYS ER 20 MEQ PO TBCR
40.0000 meq | EXTENDED_RELEASE_TABLET | Freq: Two times a day (BID) | ORAL | Status: DC
  Administered 2014-09-08: 40 meq via ORAL
  Filled 2014-09-08: qty 2

## 2014-09-08 MED ORDER — DEXTROSE 10 % IV SOLN
INTRAVENOUS | Status: DC
  Administered 2014-09-08: 19:00:00 via INTRAVENOUS

## 2014-09-08 MED ORDER — DEXTROSE 50 % IV SOLN
INTRAVENOUS | Status: AC
  Filled 2014-09-08: qty 50

## 2014-09-08 MED ORDER — PANCRELIPASE (LIP-PROT-AMYL) 36000-114000 UNITS PO CPEP
36000.0000 [IU] | ORAL_CAPSULE | Freq: Three times a day (TID) | ORAL | Status: DC
  Administered 2014-09-08 – 2014-09-09 (×2): 36000 [IU] via ORAL
  Filled 2014-09-08 (×4): qty 1

## 2014-09-08 MED ORDER — ARIPIPRAZOLE 2 MG PO TABS
22.0000 mg | ORAL_TABLET | Freq: Every day | ORAL | Status: DC
  Administered 2014-09-08: 2 mg via ORAL
  Filled 2014-09-08 (×2): qty 1

## 2014-09-08 MED ORDER — DEXTROSE 50 % IV SOLN
1.0000 | Freq: Once | INTRAVENOUS | Status: AC
  Administered 2014-09-08: 50 mL via INTRAVENOUS

## 2014-09-08 MED ORDER — PANTOPRAZOLE SODIUM 40 MG PO TBEC
40.0000 mg | DELAYED_RELEASE_TABLET | Freq: Every day | ORAL | Status: DC
  Administered 2014-09-08 – 2014-09-09 (×2): 40 mg via ORAL
  Filled 2014-09-08: qty 1

## 2014-09-08 MED ORDER — ASPIRIN EC 81 MG PO TBEC
81.0000 mg | DELAYED_RELEASE_TABLET | Freq: Every day | ORAL | Status: DC
  Administered 2014-09-08: 81 mg via ORAL
  Filled 2014-09-08: qty 1

## 2014-09-08 MED ORDER — HYDROCHLOROTHIAZIDE 25 MG PO TABS
25.0000 mg | ORAL_TABLET | Freq: Every day | ORAL | Status: DC
  Administered 2014-09-09: 25 mg via ORAL
  Filled 2014-09-08: qty 1

## 2014-09-08 MED ORDER — DEXTROSE 50 % IV SOLN
1.0000 | Freq: Once | INTRAVENOUS | Status: AC
  Administered 2014-09-08: 50 mL via INTRAVENOUS
  Filled 2014-09-08: qty 50

## 2014-09-08 MED ORDER — ENOXAPARIN SODIUM 40 MG/0.4ML ~~LOC~~ SOLN
40.0000 mg | SUBCUTANEOUS | Status: DC
  Administered 2014-09-08: 40 mg via SUBCUTANEOUS
  Filled 2014-09-08: qty 0.4

## 2014-09-08 MED ORDER — POTASSIUM CHLORIDE CRYS ER 20 MEQ PO TBCR
40.0000 meq | EXTENDED_RELEASE_TABLET | Freq: Once | ORAL | Status: AC
  Administered 2014-09-08: 40 meq via ORAL
  Filled 2014-09-08: qty 2

## 2014-09-08 MED ORDER — ARIPIPRAZOLE 2 MG PO TABS
2.0000 mg | ORAL_TABLET | Freq: Every day | ORAL | Status: DC
  Filled 2014-09-08: qty 1

## 2014-09-08 MED ORDER — DEXTROSE 10 % IV SOLN
INTRAVENOUS | Status: DC
  Administered 2014-09-08: 13:00:00 via INTRAVENOUS

## 2014-09-08 NOTE — H&P (Signed)
Date: 09/08/2014               Patient Name:  Michael [name] MRN: [id number]  DOB: July 03, 1955 Age / Sex: 59 y.o., male   PCP: Juluis Mire, MD         Medical Service: Internal Medicine Teaching Service         Attending Physician: Dr. Axel Filler, MD    First Contact: Kathi Simpers, MS4 Pager: [phone]  Second Contact: Dr. Heber Watson Pager: [phone]       After Hours (After 5p/  First Contact Pager: [phone]  weekends / holidays): Second Contact Pager: [phone]   Chief Complaint: hypoglycemia  History of Present Illness: Michael [name] is a 59 yo male with history of T2DM, chronic pancreatitis, HTN, who presents to [facility] via EMS after an episdoe of falling to the floor with jerking movements today at [facility].  He reports that he went to [facility] to pick up his diabetic testing supplies this morning when he felt dizzy and unwell. He then reports falling to the flood in [facility] and having some shaking.  He reports this is similar to other hypoglycemic episodes. He denies any loss of conscienceness loss of bowel or bladder function.  He does report that he is taking 65 units of Lantus QHS (did take last night) and taking novolog 20units BID (took this morning but skipped breakfast)  He does take glipizide but did not take this morning.  Meds: Current Facility-Administered Medications  Medication Dose Route Frequency Provider Last Rate Last Dose  . ARIPiprazole (ABILIFY) tablet 2 mg  2 mg Oral QHS Lucious Groves, DO      . ARIPiprazole (ABILIFY) tablet 20 mg  20 mg Oral QHS Lucious Groves, DO      . aspirin EC tablet 81 mg  81 mg Oral QHS Lucious Groves, DO      . dextrose 10 % infusion   Intravenous Continuous Dorie Rank, MD 150 mL/hr at 09/08/14 1853    . enoxaparin (LOVENOX) injection 40 mg  40 mg Subcutaneous Q24H Lucious Groves, DO      . [START ON 09/09/2014] hydrochlorothiazide (HYDRODIURIL) tablet 25 mg  25 mg Oral Daily Lucious Groves, DO      . lipase/protease/amylase  (CREON) capsule 36,000 Units  36,000 Units Oral TID Lucious Groves, DO      . pantoprazole (PROTONIX) EC tablet 40 mg  40 mg Oral Daily Lucious Groves, DO      . potassium chloride SA (K-DUR,KLOR-CON) CR tablet 40 mEq  40 mEq Oral BID Lucious Groves, DO      . traMADol Veatrice Bourbon) tablet 50 mg  50 mg Oral Q6H PRN Lucious Groves, DO   50 mg at 09/08/14 1953    Allergies: Allergies as of 09/08/2014 - Review Complete 09/08/2014  Allergen Reaction Noted  . Ace inhibitors Swelling 02/02/2011  . Losartan Swelling 02/03/2011   Past Medical History  Diagnosis Date  . Chronic pancreatitis   . Smoker   . Gastritis due to alcohol without hemorrhage   . HTN (hypertension)   . Pancreatic lesion     appears innocent and related to chronic pancreatitis  . Heavy alcohol use     hx of  . Marijuana use     hx of  . Migraine   . Depression   . Helicobacter pylori gastritis 03/27/2011    on endoscopy, treated with omeprazole, clarithromycin and amoxicillin  .  Insomnia   . ACE inhibitor-aggravated angioedema   . Sessile colonic polyp   . Diabetes mellitus   . GERD (gastroesophageal reflux disease)   . Chronic abdominal pain on narcotics 06/27/2012  . Seizures    Past Surgical History  Procedure Laterality Date  . Esophagogastroduodenoscopy  03/22/2011    Procedure: ESOPHAGOGASTRODUODENOSCOPY (EGD);  Surgeon: Gatha Mayer, MD;  Location: [facility];  Service: Endoscopy;  Laterality: N/A;  egd with balloon   . Balloon dilation  03/22/2011    Procedure: BALLOON DILATION;  Surgeon: Gatha Mayer, MD;  Location: [facility];  Service: Endoscopy;  Laterality: N/A;  . Eus  04/20/2011    Procedure: UPPER ENDOSCOPIC ULTRASOUND (EUS) LINEAR;  Surgeon: Milus Banister, MD;  Location: [facility];  Service: Endoscopy;  Laterality: N/A;  . Colonoscopy w/ biopsies and polypectomy  09/12/11  . Colonoscopy N/A 05/22/2012    Procedure: COLONOSCOPY;  Surgeon: Gatha Mayer, MD;  Location: [place];  Service:  Endoscopy;  Laterality: N/A;   Family History  Problem Relation Age of Onset  . Colon cancer Neg Hx   . Stomach cancer Neg Hx   . Anesthesia problems Neg Hx   . Hypotension Neg Hx   . Pseudochol deficiency Neg Hx   . Malignant hyperthermia Neg Hx   . Hypertension Sister   . Diabetes Sister   . Heart disease Sister    Social History   Social History  . Marital Status: Single    Spouse Name: N/A  . Number of Children: 0  . Years of Education: 11   Occupational History  .    [employer]    worked for 6 years  . 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 Smoking status: Current Every Day Smoker -- 0.60 packs/day for 30 years    Types: Cigarettes  . Smokeless tobacco: Never Used     Comment: 6-7 cigarettes a day  . Alcohol Use: 6.0 oz/week    10 Standard drinks or equivalent per week  . Drug Use: Yes    Special: Marijuana     Comment: occasional use  . Sexual Activity: Not on file   Other Topics Concern  . Not on file   Social History Narrative   Has been living at 3 different friend's homes since discharge 01/2011. Was living in the shelter prior to admission. Had previously lived with his niece and other family members but he has gotten kicked out each time.     Review of Systems: Review of Systems  Constitutional: Positive for malaise/fatigue. Negative for fever and chills.  Eyes: Negative for blurred vision.  Respiratory: Negative for cough and shortness of breath.   Cardiovascular: Negative for chest pain.  Gastrointestinal: Negative for heartburn, abdominal pain, diarrhea and constipation.  Genitourinary: Negative for dysuria and frequency.  Musculoskeletal: Negative for myalgias.  Neurological: Negative for dizziness, weakness and headaches.  Psychiatric/Behavioral: Negative for suicidal ideas and substance abuse.     Physical Exam: Blood pressure 137/75, pulse 82, temperature 98.4 F (36.9  C), temperature source Oral, resp. rate 20, height 5\' 4"  (1.626 m), weight 160 lb 8 oz (72.802 kg), SpO2 100 %. Physical Exam  Constitutional: He is oriented to person, place, and time and well-developed, well-nourished, and in no distress.  HENT:  Head: Normocephalic and atraumatic.  Eyes: Conjunctivae are normal.  Cardiovascular: Normal rate, regular rhythm and normal heart  sounds.   No murmur heard. Pulmonary/Chest: Effort normal and breath sounds normal.  Abdominal: Soft. Bowel sounds are normal. There is no tenderness.  Musculoskeletal: He exhibits no edema.  Neurological: He is alert and oriented to person, place, and time. No cranial nerve deficit.  Skin: Skin is warm and dry.  Psychiatric:  Pressured speech  Nursing note and vitals reviewed.    Lab results: Basic Metabolic Panel:  Recent Labs  09/08/14 1208 09/08/14 1551  NA 142 140  K 2.9* 3.0*  CL 106 105  CO2 23 26  GLUCOSE 80 55*  BUN 8 7  CREATININE 0.85 0.66  CALCIUM 8.4* 8.3*   Liver Function Tests:  Recent Labs  09/08/14 1208  AST 66*  ALT 29  ALKPHOS 89  BILITOT 0.4  PROT 5.5*  ALBUMIN 3.1*   No results for input(s): LIPASE, AMYLASE in the last 72 hours. No results for input(s): AMMONIA in the last 72 hours. CBC:  Recent Labs  09/08/14 1208  WBC 8.6  HGB 14.3  HCT 41.4  MCV 99.0  PLT 167   CBG:  Recent Labs  09/08/14 1505 09/08/14 1550 09/08/14 1646 09/08/14 1755 09/08/14 1821 09/08/14 1958  GLUCAP 56* 62* 70 52* 93 175*   Urine Drug Screen: Drugs of Abuse     Component Value Date/Time   LABOPIA NONE DETECTED 03/04/2014 0045   COCAINSCRNUR NONE DETECTED 03/04/2014 0045   COCAINSCRNUR NEG 07/05/2012 0946   LABBENZ NONE DETECTED 03/04/2014 0045   LABBENZ NEG 07/05/2012 0946   AMPHETMU NONE DETECTED 03/04/2014 0045   AMPHETMU NEG 07/05/2012 0946   THCU POSITIVE* 03/04/2014 0045   LABBARB NONE DETECTED 03/04/2014 0045    Alcohol Level:  Recent Labs  09/08/14 1208  09/08/14 1551  ETH <5 <5    Imaging results:  No results found.  Other results: EKG: normal EKG, normal sinus rhythm, unchanged from previous tracings.  Assessment & Plan by Problem:   Hypoglycemia associated with uncontrolledtype 2 diabetes mellitus -Patient found to be profoundly hypoglycemic by EMS as well as multiple bouts of hypoglycemia in the ED.  This is sure due to his self injecting of insulin but may be compounded by oral sulfonylurea use. - WIll hold his DM meds - Check BMP, C-peptide, and insulin as well as sulfonylurea urine panel - Definitely needs more education about proper insulin use (injecting mealtime insulin and not eating)  Glipizide also needs to be d/c on discharge - Will continue D10 drip, Q2CBG checks if low needs 1 amp of D50     HTN (hypertension) - Continue home medications  Pancreatic insufficiency - Continue creon.  Hypokalemia - Likely due to exogenous insulin - Given 52 of Kdur in ED, will give additional 40x2  Dispo: Disposition is deferred at this time, awaiting improvement of current medical problems. Anticipated discharge in approximately 1-2 day(s).   The patient does have a current PCP (Juluis Mire, MD) and does need an [facility] hospital follow-up appointment after discharge.  The patient does not have transportation limitations that hinder transportation to clinic appointments.  Signed: Lucious Groves, DO 09/08/2014, 8:01 PM

## 2014-09-08 NOTE — ED Notes (Signed)
CBG 82 

## 2014-09-08 NOTE — ED Notes (Signed)
CBG-70 

## 2014-09-08 NOTE — ED Provider Notes (Addendum)
CSN: [id number]     Arrival date & time 09/08/14  1127 History   First MD Initiated Contact with Patient 09/08/14 1130     Chief Complaint  Patient presents with  . Seizures   HPI Patient presents to the emergency room for evaluation of hypoglycemia and seizure. The patient took his insulin medications as usual this morning. He does not think he ate anything. Patient was at the pharmacy trying to get glucose testing strips. He was not able to check his blood sugar this morning prior to administration of his insulin. EMS was called to the pharmacy because the patient had a syncopal episode. Witnesses describe them in 2 of tonic-clonic activity. Patient was found confused. Miss checked his blood sugar and it was in the 30s. He was given an amp of D50 and his symptoms improved. Patient denies any trouble with headache or injuries. Denies any chest pain or shortness of breath. He denies any trouble with abdominal pain nausea or vomiting. He has had issues in the past with hypoglycemia. Past Medical History  Diagnosis Date  . Chronic pancreatitis   . Smoker   . Gastritis due to alcohol without hemorrhage   . HTN (hypertension)   . Pancreatic lesion     appears innocent and related to chronic pancreatitis  . Heavy alcohol use     hx of  . Marijuana use     hx of  . Migraine   . Depression   . Helicobacter pylori gastritis 03/27/2011    on endoscopy, treated with omeprazole, clarithromycin and amoxicillin  . Insomnia   . ACE inhibitor-aggravated angioedema   . Sessile colonic polyp   . Diabetes mellitus   . GERD (gastroesophageal reflux disease)   . Chronic abdominal pain on narcotics 06/27/2012  . Seizures    Past Surgical History  Procedure Laterality Date  . Esophagogastroduodenoscopy  03/22/2011    Procedure: ESOPHAGOGASTRODUODENOSCOPY (EGD);  Surgeon: Gatha Mayer, MD;  Location: [facility];  Service: Endoscopy;  Laterality: N/A;  egd with balloon   . Balloon dilation  03/22/2011   Procedure: BALLOON DILATION;  Surgeon: Gatha Mayer, MD;  Location: [facility];  Service: Endoscopy;  Laterality: N/A;  . Eus  04/20/2011    Procedure: UPPER ENDOSCOPIC ULTRASOUND (EUS) LINEAR;  Surgeon: Milus Banister, MD;  Location: [facility];  Service: Endoscopy;  Laterality: N/A;  . Colonoscopy w/ biopsies and polypectomy  09/12/11  . Colonoscopy N/A 05/22/2012    Procedure: COLONOSCOPY;  Surgeon: Gatha Mayer, MD;  Location: [place];  Service: Endoscopy;  Laterality: N/A;   Family History  Problem Relation Age of Onset  . Colon cancer Neg Hx   . Stomach cancer Neg Hx   . Anesthesia problems Neg Hx   . Hypotension Neg Hx   . Pseudochol deficiency Neg Hx   . Malignant hyperthermia Neg Hx   . Hypertension Sister   . Diabetes Sister   . Heart disease Sister    Social History  Substance Use Topics  . Smoking status: Current Every Day Smoker -- 0.60 packs/day for 30 years    Types: Cigarettes  . Smokeless tobacco: Never Used     Comment: 4 -5 cigs/day.Wants to quit. Up to 7-8 cigs per day  . Alcohol Use: No     Comment: sobriety since 2/13,was drinking 7-10 beer/daily    Review of Systems  All other systems reviewed and are negative.     Allergies  Ace inhibitors and Losartan  Home  Medications   Prior to Admission medications   Medication Sig Start Date End Date Taking? Authorizing Provider  ACCU-CHEK AVIVA PLUS test strip check blood sugar THREE TIMES DAILY BEFORE MEALS AND AT BEDTIME 12/16/13   Jerrye Noble, MD  ARIPiprazole (ABILIFY) 15 MG tablet Take 15 mg by mouth at bedtime. Takes with 5 mg and 2 mg for a 22 mg dose    Historical Provider, MD  ARIPiprazole (ABILIFY) 2 MG tablet Take 2 mg by mouth at bedtime. Takes with 15 mg and 5 mg for a 22 mg dose    Historical Provider, MD  ARIPiprazole (ABILIFY) 5 MG tablet Take 5 mg by mouth at bedtime. Takes with 15 mg and 2 mg for a 22 mg dose    Historical Provider, MD  aspirin EC 81 MG tablet Take 81 mg by  mouth at bedtime.    Historical Provider, MD  Blood Glucose Monitoring Suppl (ACCU-CHEK NANO SMARTVIEW) W/DEVICE KIT 1 each by Does not apply route 3 (three) times daily. Dx code 250.00 insulin requiring 10/07/12   Dominic Pea, DO  FLUoxetine (PROZAC) 20 MG capsule Take 20 mg by mouth 2 (two) times daily.     Historical Provider, MD  glipiZIDE (GLUCOTROL) 10 MG tablet Take 10 mg by mouth 2 (two) times daily before a meal. 07/22/12   Jerrye Noble, MD  glucose blood (ACCU-CHEK SMARTVIEW) test strip Check blood sugar 3 times a day before meals and bedtime Dx code E11.65 insulin requiring 02/11/14   Jerrye Noble, MD  glucose blood (AGAMATRIX PRESTO TEST) test strip Use as instructed 12/11/13   Jerrye Noble, MD  hydrochlorothiazide (HYDRODIURIL) 25 MG tablet Take 1 tablet (25 mg total) by mouth daily. 05/14/14   Jerrye Noble, MD  HYDROcodone-acetaminophen (NORCO/VICODIN) 5-325 MG per tablet Take 1 tablet by mouth every 6 (six) hours as needed. 07/30/14   Montine Circle, PA-C  insulin aspart (NOVOLOG) 100 UNIT/ML injection Inject 20 Units into the skin 2 (two) times daily.    Historical Provider, MD  Insulin Glargine (LANTUS SOLOSTAR) 100 UNIT/ML Solostar Pen Inject 0.65 mLs (65 Units total) into the skin at bedtime 03/20/14   Jerrye Noble, MD  Insulin Pen Needle (B-D UF III MINI PEN NEEDLES) 31G X 5 MM MISC USE TO INJECT insulin THREE TIMES DAILY 08/21/14   Juluis Mire, MD  lovastatin (MEVACOR) 40 MG tablet Take 1 tablet (40 mg total) by mouth at bedtime. 03/20/14   Jerrye Noble, MD  meloxicam (MOBIC) 7.5 MG tablet Take 1 tablet (7.5 mg total) by mouth daily. 05/14/14   Jerrye Noble, MD  metFORMIN (GLUCOPHAGE) 500 MG tablet Take 1,000 mg by mouth 2 (two) times daily with a meal.    Historical Provider, MD  ondansetron (ZOFRAN) 4 MG tablet Take 1 tablet (4 mg total) by mouth every 6 (six) hours. 05/14/14   Jerrye Noble, MD  Pancrelipase, Lip-Prot-Amyl, (CREON) 36000 UNITS CPEP Take 36,000 Units by mouth 3  (three) times daily.    Historical Provider, MD  pantoprazole (PROTONIX) 40 MG tablet Take 1 tablet (40 mg total) by mouth daily. 02/07/13   Jerrye Noble, MD  promethazine (PHENERGAN) 25 MG tablet Take 1 tablet (25 mg total) by mouth every 6 (six) hours as needed. for nausea 03/20/14   Jerrye Noble, MD  Vitamin D, Ergocalciferol, (DRISDOL) 50000 UNITS CAPS capsule Take 50,000 Units by mouth every 7 (seven) days. Every Sunday    Historical Provider, MD  BP 142/77 mmHg  Pulse 107  Temp(Src) 98.6 F (37 C) (Oral)  Resp 21  SpO2 97% Physical Exam  Constitutional: He appears well-developed and well-nourished. No distress.  HENT:  Head: Normocephalic and atraumatic.  Right Ear: External ear normal.  Left Ear: External ear normal.  Eyes: Conjunctivae are normal. Right eye exhibits no discharge. Left eye exhibits no discharge. No scleral icterus.  Neck: Neck supple. No tracheal deviation present.  Cardiovascular: Normal rate, regular rhythm and intact distal pulses.   Pulmonary/Chest: Effort normal and breath sounds normal. No stridor. No respiratory distress. He has no wheezes. He has no rales.  Abdominal: Soft. Bowel sounds are normal. He exhibits no distension. There is no tenderness. There is no rebound and no guarding.  Musculoskeletal: He exhibits no edema or tenderness.  Neurological: He is alert. He has normal strength. No cranial nerve deficit (no facial droop, extraocular movements intact, no slurred speech) or sensory deficit. He exhibits normal muscle tone. He displays no seizure activity. Coordination normal.  Skin: Skin is warm and dry. No rash noted.  Psychiatric: He has a normal mood and affect.  Nursing note and vitals reviewed.   ED Course  Procedures (including critical care time) Labs Review Labs Reviewed  CBC  COMPREHENSIVE METABOLIC PANEL  CBG MONITORING, ED  CBG MONITORING, ED  CBG MONITORING, ED  CBG MONITORING, ED     EKG Interpretation   Date/Time:   Tuesday September 08 2014 11:31:31 EDT Ventricular Rate:  114 PR Interval:  130 QRS Duration: 69 QT Interval:  343 QTC Calculation: 472 R Axis:   44 Text Interpretation:  Sinus tachycardia Borderline repolarization  abnormality No significant change since last tracing Confirmed by [place] Surges   MD-J, Tomeka Kantner ([id number]) on 09/08/2014 11:50:13 AM     Medications  dextrose 10 % infusion ( Intravenous New Bag/Given 09/08/14 1236)  dextrose 50 % solution (not administered)  dextrose 50 % solution 50 mL (50 mLs Intravenous Given 09/08/14 1142)  dextrose 50 % solution 50 mL (50 mLs Intravenous Given 09/08/14 1236)   CRITICAL CARE Performed by: ZESPQ,ZRA Total critical care time: 35 Critical care time was exclusive of separately billable procedures and treating other patients. Critical care was necessary to treat or prevent imminent or life-threatening deterioration. Critical care was time spent personally by me on the following activities: development of treatment plan with patient and/or surrogate as well as nursing, discussions with consultants, evaluation of patient's response to treatment, examination of patient, obtaining history from patient or surrogate, ordering and performing treatments and interventions, ordering and review of laboratory studies, ordering and review of radiographic studies, pulse oximetry and re-evaluation of patient's condition.  1440  Pt has had another episode of hypoglycemia despite d50, d10 infusion and giving him food.  Will give additional d50 and increase his d10 rate.  MDM   Final diagnoses:  Hypoglycemia    The patient has been having recurrent issues with hypoglycemia here in the emergency department despite infusions of D50. The patient has been given 2 additional doses while in the emergency department because of recurrent blood sugars in the 30s. Patient has also been given a meal. I'll start him on a D10 infusion and plan on hospitalization for serial monitoring of his  blood glucose.  Unclear at this point if the patient took excessive insulin accidentally.  Pt also has a history of alcohol abuse which could be a contributing factor as well.   Despite multiple doses of d50 and dextrose infusion pt became  recurrently hypoglycemic.  Suspect possible inadvertent insulin overdose but will admit to the hospital for monitoring and further treatment.  Dorie Rank, MD 09/09/14 [phone]

## 2014-09-08 NOTE — ED Notes (Signed)
CBG 66-MD aware

## 2014-09-08 NOTE — Progress Notes (Signed)
CBG 52-MD aware, crackers and juice given, pt asymptomatic.

## 2014-09-08 NOTE — ED Notes (Signed)
MD at bedside. 

## 2014-09-08 NOTE — ED Notes (Signed)
MD aware of current CBG-82

## 2014-09-08 NOTE — ED Notes (Signed)
CBG 56-MD aware, crackers and juice given, pt asymptomatic.

## 2014-09-08 NOTE — ED Notes (Signed)
Pt presents via [facility] with c/o new onset seizures.  Pt was at [facility] in line when an employee noticed he stopped talking and started twitching, employee able to lie pt down on the floor, pt begin having tonic clonic movements x 1 min.  On EMS arrival pt was unresponsive, drooling, snoring, CBG 33, D50 given, CBG 181.  Pt a x 4 on arrival.  No hx of seizures.  BP-148/86, P-110 ST, O2-99% RA, R-18, Pt denies pain, SOB, N/V.  Pt NAD.

## 2014-09-08 NOTE — H&P (Signed)
Date: 09/08/2014               Patient Name:  Michael [name] MRN: [id number]  DOB: [birth year] Age / Sex: 59 y.o., male   PCP: Juluis Mire, MD         Medical Service: Internal Medicine Teaching Service, [place]         Attending Physician: Dr. Axel Filler, MD    First Contact: Kathi Simpers, MS4 Pager: [phone]  Second Contact: Dr. Heber Ostrander Pager: [phone]       After Hours (After 5p/  First Contact Pager: [phone]  weekends / holidays): Second Contact Pager: [phone]   Chief Complaint:  Hypoglycemia  History of Present Illness: [name] is a 59 y.o. male with a h/o of chronic pancreatitis secondary to alcoholism, T2DM and HTN who presents with new onset seizures and hypoglycemia. Patient had not had breakfast this morning, but did take his morning dose of Novolog 20u. He reports feeling dizzy this morning. He was in line at [facility] when an employee noticed he stopped talking and started twitching. The employee was able to lie patient down on the floor, and patient had tonic clonic movements for 1 min, though he reports he never lost consciousness. On EMS arrival patient was unresponsive, drooling, snoring, BG 33. Patient was given D50, with a repeat CBG of 181.On arrival to the ED, patient was alert and oriented x4, in NAD, and reported no pain, SOB, or n/v. He received 161m of D50, then was started on D10 at 125cc/hr with BG levels in the 50s-80s. Of note, patient has been to the ED 7 times in the last 6 months with complains of hypoglycemia or pain. [facility] had trouble getting up and was found to have a BG of 23. He was not admitted, was told to follow up with his PCP but has not done so.   Meds: Current Outpatient Rx  Name  Route  Sig  Dispense  Refill  . ARIPiprazole (ABILIFY) 15 MG tablet   Oral   Take 15 mg by mouth at bedtime. Takes with 5 mg and 2 mg for a 22 mg dose         .  ARIPiprazole (ABILIFY) 2 MG tablet   Oral   Take 2 mg by mouth at bedtime. Takes with 15 mg and 5 mg for a 22 mg dose         . ARIPiprazole (ABILIFY) 5 MG tablet   Oral   Take 5 mg by mouth at bedtime. Takes with 15 mg and 2 mg for a 22 mg dose         . aspirin EC 81 MG tablet   Oral   Take 81 mg by mouth at bedtime.         .Marland KitchenFLUoxetine (PROZAC) 20 MG capsule   Oral   Take 20 mg by mouth 2 (two) times daily.          .Marland KitchenglipiZIDE (GLUCOTROL) 10 MG tablet   Oral   Take 10 mg by mouth 2 (two) times daily before a meal.         . hydrochlorothiazide (HYDRODIURIL) 25 MG tablet   Oral   Take 1 tablet (25 mg total) by mouth daily.   90 tablet   3   . insulin aspart (NOVOLOG) 100 UNIT/ML injection   Subcutaneous   Inject  20 Units into the skin 2 (two) times daily.         . Insulin Glargine (LANTUS SOLOSTAR) 100 UNIT/ML Solostar Pen      Inject 0.65 mLs (65 Units total) into the skin at bedtime   30 mL   11   . lovastatin (MEVACOR) 40 MG tablet   Oral   Take 1 tablet (40 mg total) by mouth at bedtime.   30 tablet   2   . meloxicam (MOBIC) 7.5 MG tablet   Oral   Take 1 tablet (7.5 mg total) by mouth daily.   30 tablet   5   . metFORMIN (GLUCOPHAGE) 500 MG tablet   Oral   Take 1,000 mg by mouth 2 (two) times daily with a meal.         . ondansetron (ZOFRAN) 4 MG tablet   Oral   Take 1 tablet (4 mg total) by mouth every 6 (six) hours.   6 tablet   5   . Pancrelipase, Lip-Prot-Amyl, (CREON) 36000 UNITS CPEP   Oral   Take 36,000 Units by mouth 3 (three) times daily.         . pantoprazole (PROTONIX) 40 MG tablet   Oral   Take 1 tablet (40 mg total) by mouth daily.   90 tablet   0   . Vitamin D, Ergocalciferol, (DRISDOL) 50000 UNITS CAPS capsule   Oral   Take 50,000 Units by mouth every 7 (seven) days. Every Sunday         . ACCU-CHEK AVIVA PLUS test strip      check blood sugar THREE TIMES DAILY BEFORE MEALS AND AT BEDTIME   100  each   12   . Blood Glucose Monitoring Suppl (ACCU-CHEK NANO SMARTVIEW) W/DEVICE KIT   Does not apply   1 each by Does not apply route 3 (three) times daily. Dx code 250.00 insulin requiring   1 kit   0   . glucose blood (ACCU-CHEK SMARTVIEW) test strip      Check blood sugar 3 times a day before meals and bedtime Dx code E11.65 insulin requiring   100 each   12   . glucose blood (AGAMATRIX PRESTO TEST) test strip      Use as instructed   100 each   12   . Insulin Pen Needle (B-D UF III MINI PEN NEEDLES) 31G X 5 MM MISC      USE TO INJECT insulin THREE TIMES DAILY   100 each   3     Allergies: Allergies as of 09/08/2014 - Review Complete 09/08/2014  Allergen Reaction Noted  . Ace inhibitors Swelling 02/02/2011  . Losartan Swelling 02/03/2011   Past Medical History  Diagnosis Date  . Chronic pancreatitis   . Smoker   . Gastritis due to alcohol without hemorrhage   . HTN (hypertension)   . Pancreatic lesion     appears innocent and related to chronic pancreatitis  . Heavy alcohol use     hx of  . Marijuana use     hx of  . Migraine   . Depression   . Helicobacter pylori gastritis 03/27/2011    on endoscopy, treated with omeprazole, clarithromycin and amoxicillin  . Insomnia   . ACE inhibitor-aggravated angioedema   . Sessile colonic polyp   . Diabetes mellitus   . GERD (gastroesophageal reflux disease)   . Chronic abdominal pain on narcotics 06/27/2012  . Seizures    Past Surgical History  Procedure Laterality Date  . Esophagogastroduodenoscopy  03/22/2011    Procedure: ESOPHAGOGASTRODUODENOSCOPY (EGD);  Surgeon: Gatha Mayer, MD;  Location: [facility];  Service: Endoscopy;  Laterality: N/A;  egd with balloon   . Balloon dilation  03/22/2011    Procedure: BALLOON DILATION;  Surgeon: Gatha Mayer, MD;  Location: [facility];  Service: Endoscopy;  Laterality: N/A;  . Eus  04/20/2011    Procedure: UPPER ENDOSCOPIC ULTRASOUND (EUS) LINEAR;  Surgeon: Milus Banister, MD;  Location: [facility];  Service: Endoscopy;  Laterality: N/A;  . Colonoscopy w/ biopsies and polypectomy  09/12/11  . Colonoscopy N/A 05/22/2012    Procedure: COLONOSCOPY;  Surgeon: Gatha Mayer, MD;  Location: [place];  Service: Endoscopy;  Laterality: N/A;   Family History  Problem Relation Age of Onset  . Colon cancer Neg Hx   . Stomach cancer Neg Hx   . Anesthesia problems Neg Hx   . Hypotension Neg Hx   . Pseudochol deficiency Neg Hx   . Malignant hyperthermia Neg Hx   . Hypertension Sister   . Diabetes Sister   . Heart disease Sister    Social History   Social History  . Marital Status: Single    Spouse Name: N/A  . Number of Children: 0  . Years of Education: 11   Occupational History  .    [employer]    worked for 6 years  . 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 Smoking status: Current Every Day Smoker -- 0.60 packs/day for 30 years    Types: Cigarettes  . Smokeless tobacco: Never Used     Comment: 6-7 cigarettes a day  . Alcohol Use: 6.0 oz/week    10 Standard drinks or equivalent per week  . Drug Use: Yes    Special: Marijuana     Comment: occasional use  . Sexual Activity: Not on file   Other Topics Concern  . Not on file   Social History Narrative   Has been living at 3 different friend's homes since discharge 01/2011. Was living in the shelter prior to admission. Had previously lived with his niece and other family members but he has gotten kicked out each time.     Review of Systems: As per HPI  Physical Exam: Blood pressure 143/92, pulse 97, temperature 98.6 F (37 C), temperature source Oral, resp. rate 20, SpO2 99 %. BP 143/92 mmHg  Pulse 97  Temp(Src) 98.6 F (37 C) (Oral)  Resp 20  SpO2 99%  General Appearance:    Alert, cooperative, no distress, appears stated age  Head:    Normocephalic, without obvious abnormality, atraumatic  Eyes:    PERRL,  conjunctiva/corneas clear, R eye deviates outward, EOM's intact  Lungs:     Clear to auscultation bilaterally, respirations unlabored  Heart:    Regular rate and rhythm, S1 and S2 normal, no murmur, rub   or gallop  Abdomen:     Soft, non-tender, bowel sounds active, no masses, no organomegaly  Extremities:   Extremities normal, atraumatic, no cyanosis or edema  Pulses:   2+ and symmetric all extremities  Skin:   Skin color, texture, turgor normal, no rashes or lesions  Neurologic:   CNII-XII grossly intact, sensation intact to light touch in all extremities      Labs:  Basic Metabolic Panel:  Recent Labs Lab 09/08/14 1208 09/08/14 1551  NA 142 140  K 2.9* 3.0*  CL 106 105  CO2 23 26  GLUCOSE 80 55*  BUN 8 7  CREATININE 0.85 0.66  CALCIUM 8.4* 8.3*    Liver Function Tests:  Recent Labs Lab 09/08/14 1208  AST 66*  ALT 29  ALKPHOS 89  BILITOT 0.4  PROT 5.5*  ALBUMIN 3.1*   No results for input(s): LIPASE, AMYLASE in the last 168 hours. No results for input(s): AMMONIA in the last 168 hours.  CBC:  Recent Labs Lab 09/08/14 1208  WBC 8.6  HGB 14.3  HCT 41.4  MCV 99.0  PLT 167    Cardiac Enzymes: No results for input(s): CKTOTAL, CKMB, CKMBINDEX, TROPONINI in the last 168 hours.  BNP: Invalid input(s): POCBNP  CBG:  Recent Labs Lab 09/08/14 1434 09/08/14 1440 09/08/14 1505 09/08/14 1550 09/08/14 1646  GLUCAP 35* 31* 55* 62* 34    Microbiology: Results for orders placed or performed during the hospital encounter of 02/02/11  MRSA PCR Screening     Status: None   Collection Time: 02/03/11  6:38 AM  Result Value Ref Range Status   MRSA by PCR NEGATIVE NEGATIVE Final    Comment:        The GeneXpert MRSA Assay (FDA approved for NASAL specimens only), is one component of a comprehensive MRSA colonization surveillance program. It is not intended to diagnose MRSA infection nor to guide or monitor treatment for MRSA infections.    Other  results: EKG: sinus tachycardia at 114 BPM, PR interval 130 ms, QRS duration 69 ms, QT/QTc 343/472 ms    Assessment & Plan by Problem: Active Problems:   Hypoglycemia   Mr. LAKEITH CAREAGA is a 59 y.o. male with chronic pancreatitis secondary to alcoholism, chronic pain, T2DM and HTN who presents with new onset seizures in the context of hypoglycemia.  New onset seizure: Isolated seizure likely due to hypoglycemia given BG of 33 on EMS arrival. Patient has been on 105 units of insulin per day in addition to glipizide and metformin, and did not eat breakfast this morning. Last HbA1C 7.3 in May 2016. Patient normally checks his BG three times a day and states his sugars have been "normal." -continue D10 at 150 ml/hour  -check BG every 2 hours -discontinue seizure precautions  -hold all home DM meds for now -C-peptide, insulin, and sulfonylurea panel pending  -repeat HbA1C -Diabetes educator consult   Chronic pancreatitis: per 03/20/2014 office visit note, patient has violated multiple pain contracts with multiple clinics. This coupled with the fact that patient has started to use alcohol and marijuana again may be a hindrance to him being approved by any other provider for long term management.  -tramadol for pain  -continue home Creon -counsel on alcohol and tobacco cessation  Hypokalemia: potassium 2.9 on admission, likely due to excess insulin. Patient received 14mq of K in the ED -administer K 484m PO x2 -checking magnesium -recheck BMP in the morning  Alcohol abuse: Patient drinks about 10 drinks a week, last drink 3 days ago. Alcohol use could have continued to hypoglycemia during last admission, as patient had a BAL of 365. BAL <5 during this admission. -CAGE questionnaire and counseling tomorrow -no need for CIWA protocol as patient has not had a drink in >72 hours -SW consult for alcohol use and history of homelessness  HTN: stable in the 130s/60s -hold home HCTZ tongiht  due to hypokalemia, restart tomorrow  Bipolar disorder: Mood stable, no SI/HI.  -continue home  aripiprazole 46m and fluoxetine 493mdaily   GERD: Continue home pantoprazole    DVT PPX - low molecular weight heparin   DIET: Full  CODE STATUS - Full  CONSULTS PLACED - None  DISPO - [facility]  This is a Medical Student Note.  The care of the patient was discussed with Dr. HoHeber Carolinand the assessment and plan was formulated with their assistance.  Please see their note for official documentation of the patient encounter.   Signed: Kathi SimpersMS4 Pager: [phone]/16/2016, 5:07 PM

## 2014-09-09 DIAGNOSIS — E11649 Type 2 diabetes mellitus with hypoglycemia without coma: Secondary | ICD-10-CM | POA: Diagnosis not present

## 2014-09-09 DIAGNOSIS — Z794 Long term (current) use of insulin: Secondary | ICD-10-CM | POA: Diagnosis not present

## 2014-09-09 LAB — BASIC METABOLIC PANEL
ANION GAP: 9 (ref 5–15)
BUN: <5 mg/dL — ABNORMAL LOW (ref 6–20)
CALCIUM: 8 mg/dL — AB (ref 8.9–10.3)
CHLORIDE: 97 mmol/L — AB (ref 101–111)
CO2: 27 mmol/L (ref 22–32)
Creatinine, Ser: 0.56 mg/dL — ABNORMAL LOW (ref 0.61–1.24)
GFR calc non Af Amer: >60 mL/min (ref >60)
Glucose, Bld: 106 mg/dL — ABNORMAL HIGH (ref 65–99)
Potassium: 2.8 mmol/L — ABNORMAL LOW (ref 3.5–5.1)
Sodium: 133 mmol/L — ABNORMAL LOW (ref 135–145)

## 2014-09-09 LAB — CBC
HCT: 42.1 % (ref 39.0–52.0)
HEMOGLOBIN: 14.9 g/dL (ref 13.0–17.0)
MCH: 34 pg (ref 26.0–34.0)
MCHC: 35.4 g/dL (ref 30.0–36.0)
MCV: 96.1 fL (ref 78.0–100.0)
Platelets: 159 10*3/uL (ref 150–400)
RBC: 4.38 MIL/uL (ref 4.22–5.81)
RDW: 14.9 % (ref 11.5–15.5)
WBC: 8.8 10*3/uL (ref 4.0–10.5)

## 2014-09-09 LAB — GLUCOSE, CAPILLARY
GLUCOSE-CAPILLARY: 35 mg/dL — AB (ref 65–99)
GLUCOSE-CAPILLARY: 118 mg/dL — AB (ref 65–99)
GLUCOSE-CAPILLARY: 147 mg/dL — AB (ref 65–99)
Glucose-Capillary: 105 mg/dL — ABNORMAL HIGH (ref 65–99)
Glucose-Capillary: 133 mg/dL — ABNORMAL HIGH (ref 65–99)
Glucose-Capillary: 166 mg/dL — ABNORMAL HIGH (ref 65–99)
Glucose-Capillary: 83 mg/dL (ref 65–99)

## 2014-09-09 MED ORDER — INSULIN ASPART 100 UNIT/ML ~~LOC~~ SOLN: 8.0000 [IU] | Freq: Two times a day (BID) | SUBCUTANEOUS | Status: DC

## 2014-09-09 MED ORDER — INSULIN GLARGINE 100 UNIT/ML ~~LOC~~ SOLN
40.0000 [IU] | Freq: Every day | SUBCUTANEOUS | Status: DC
  Filled 2014-09-09: qty 0.4

## 2014-09-09 MED ORDER — INSULIN ASPART 100 UNIT/ML ~~LOC~~ SOLN
8.0000 [IU] | Freq: Three times a day (TID) | SUBCUTANEOUS | Status: DC
Start: 2014-09-09 — End: 2014-09-09
  Administered 2014-09-09: 8 [IU] via SUBCUTANEOUS

## 2014-09-09 MED ORDER — POTASSIUM CHLORIDE CRYS ER 20 MEQ PO TBCR
40.0000 meq | EXTENDED_RELEASE_TABLET | ORAL | Status: AC
  Administered 2014-09-09 (×2): 40 meq via ORAL
  Filled 2014-09-09 (×2): qty 2

## 2014-09-09 MED ORDER — MAGNESIUM SULFATE 2 GM/50ML IV SOLN
2.0000 g | Freq: Once | INTRAVENOUS | Status: AC
  Administered 2014-09-09: 2 g via INTRAVENOUS
  Filled 2014-09-09: qty 50

## 2014-09-09 MED ORDER — INSULIN GLARGINE 100 UNIT/ML SOLOSTAR PEN: PEN_INJECTOR | SUBCUTANEOUS | Status: DC

## 2014-09-09 NOTE — Progress Notes (Signed)
Subjective: Sugars have stabilized overnight.  Patient feels much better this morning.  Wants to go home. Objective: Vital signs in last 24 hours: Filed Vitals:   09/08/14 1715 09/08/14 1750 09/08/14 2114 09/09/14 0639  BP: 136/88 137/75 176/91 125/76  Pulse: 81 82 97 79  Temp:  98.4 F (36.9 C) 98.7 F (37.1 C) 97.6 F (36.4 C)  TempSrc:   Oral Oral  Resp:   20 18  Height:  5\' 4"  (1.626 m)    Weight:  160 lb 8 oz (72.802 kg)    SpO2: 100% 100% 100% 65%   Weight change:  No intake or output data in the 24 hours ending 09/09/14 1014 General: resting in bed in NAD HEENT:  EOMI, no scleral icterus Cardiac: RRR, no rubs, murmurs or gallops Pulm: clear to auscultation bilaterally Abd: soft, nontender, nondistended, BS present Ext: warm and well perfused, no pedal edema Lab Results: Basic Metabolic Panel:  Recent Labs Lab 09/08/14 1551 09/08/14 2016 09/09/14 0538  NA 140  --  133*  K 3.0*  --  2.8*  CL 105  --  97*  CO2 26  --  27  GLUCOSE 55*  --  106*  BUN 7  --  <5*  CREATININE 0.66  --  0.56*  CALCIUM 8.3*  --  8.0*  MG  --  1.6*  --    Liver Function Tests:  Recent Labs Lab 09/08/14 1208  AST 66*  ALT 29  ALKPHOS 89  BILITOT 0.4  PROT 5.5*  ALBUMIN 3.1*   No results for input(s): LIPASE, AMYLASE in the last 168 hours. No results for input(s): AMMONIA in the last 168 hours. CBC:  Recent Labs Lab 09/08/14 1208 09/09/14 0538  WBC 8.6 8.8  HGB 14.3 14.9  HCT 41.4 42.1  MCV 99.0 96.1  PLT 167 159   CBG:  Recent Labs Lab 09/08/14 1550 09/08/14 1646 09/08/14 1755 09/08/14 1821 09/08/14 1958 09/08/14 2230  GLUCAP 62* 70 52* 93 175* 224*   Urine Drug Screen: Drugs of Abuse     Component Value Date/Time   LABOPIA NONE DETECTED 03/04/2014 0045   COCAINSCRNUR NONE DETECTED 03/04/2014 0045   COCAINSCRNUR NEG 07/05/2012 0946   LABBENZ NONE DETECTED 03/04/2014 0045   LABBENZ NEG 07/05/2012 0946   AMPHETMU NONE DETECTED 03/04/2014 0045   AMPHETMU NEG 07/05/2012 0946   THCU POSITIVE* 03/04/2014 0045   LABBARB NONE DETECTED 03/04/2014 0045    Alcohol Level:  Recent Labs Lab 09/08/14 1208 09/08/14 [place] <5 <5    Micro Results: No results found for this or any previous visit (from the past 240 hour(s)). Studies/Results: No results found. Medications: I have reviewed the patient's current medications. Scheduled Meds: . ARIPiprazole  22 mg Oral QHS  . aspirin EC  81 mg Oral QHS  . enoxaparin (LOVENOX) injection  40 mg Subcutaneous Q24H  . hydrochlorothiazide  25 mg Oral Daily  . insulin aspart  8 Units Subcutaneous TID WC  . insulin glargine  40 Units Subcutaneous QHS  . lipase/protease/amylase  36,000 Units Oral TID  . magnesium sulfate 1 - 4 g bolus IVPB  2 g Intravenous Once  . pantoprazole  40 mg Oral Daily  . potassium chloride  40 mEq Oral Q4H   Continuous Infusions:  PRN Meds:.traMADol Assessment/Plan:  Hypoglycemia associated with uncontrolled type 2 diabetes mellitus - We were able to obtain a BMP and C-peptide while the patient was hypoglycemic unfortunately the random insulin blood sample  was not of sufficient quantity and was discarded (the lab was added back to this morning labs when his sugar had improved to 106.) - I suspect we will find a low C-peptide level c/w his history of prandial insulin without eating.  If it is elevated it would likely be due to his sulfonylurea. - I will significantly change his insulin regimen prior to discharge.  Will reduce his Lantus to 40 units a day from 65.  Will reduce his mealtime to 8 untis, D/C glipizide and continue metformin.  I have asked him to check his sugar 3-4 times a day after discharge and bring his meter to his follow up appointment in 1 week. -We will need follow up with our CDE in clinic.    HTN (hypertension) - Well controlled with HCTZ    Pancreatic insufficiency -Creon    Hypokalemia and Hypomagnesemia - Ordered Kdur 40MEq x2 today, will  recheck BMP this afternoon - Will give 2g of IV mag - Follow up outpatient, appointment with Dr Naaman Plummer in 1 week  Dispo: Likely discharge later today after repeat BMP  The patient does have a current PCP (Juluis Mire, MD) and does need an [facility] hospital follow-up appointment after discharge.  The patient does not have transportation limitations that hinder transportation to clinic appointments.  .Services Needed at time of discharge: Y = Yes, Blank = No PT:   OT:   RN:   Equipment:   Other:     LOS: 1 day   Michael Groves, DO 09/09/2014, 10:14 AM

## 2014-09-09 NOTE — Discharge Summary (Signed)
Name: PARDEEP PAUTZ MRN: [id number] DOB: 12-06-1955 59 y.o. PCP: Juluis Mire, MD  Date of Admission: 09/08/2014 11:27 AM Date of Discharge: 09/09/2014 Attending Physician: Axel Filler, MD  Discharge Diagnosis: Severe Hypoglycemia in the setting of Insulin use.  Active Problems:   Diabetes mellitus type 2, uncontrolled   HTN (hypertension)   Hypoglycemia   Hypoglycemia associated with type 2 diabetes mellitus   Pancreatic insufficiency   Hypokalemia   Hypomagnesemia   Hypomagnesemia  Discharge Medications:   Medication List    STOP taking these medications        glipiZIDE 10 MG tablet  Commonly known as:  GLUCOTROL      TAKE these medications        ACCU-CHEK NANO SMARTVIEW W/DEVICE Kit  1 each by Does not apply route 3 (three) times daily. Dx code 250.00 insulin requiring     ARIPiprazole 15 MG tablet  Commonly known as:  ABILIFY  Take 15 mg by mouth at bedtime. Takes with 5 mg and 2 mg for a 22 mg dose     ARIPiprazole 5 MG tablet  Commonly known as:  ABILIFY  Take 5 mg by mouth at bedtime. Takes with 15 mg and 2 mg for a 22 mg dose     ARIPiprazole 2 MG tablet  Commonly known as:  ABILIFY  Take 2 mg by mouth at bedtime. Takes with 15 mg and 5 mg for a 22 mg dose     aspirin EC 81 MG tablet  Take 81 mg by mouth at bedtime.     CREON 36000 UNITS Cpep capsule  Generic drug:  lipase/protease/amylase  Take 36,000 Units by mouth 3 (three) times daily.     FLUoxetine 20 MG capsule  Commonly known as:  PROZAC  Take 20 mg by mouth 2 (two) times daily.     glucose blood test strip  Commonly known as:  AGAMATRIX PRESTO TEST  Use as instructed     ACCU-CHEK AVIVA PLUS test strip  Generic drug:  glucose blood  check blood sugar THREE TIMES DAILY BEFORE MEALS AND AT BEDTIME     glucose blood test strip  Commonly known as:  ACCU-CHEK SMARTVIEW  Check blood sugar 3 times a day before meals and bedtime Dx code E11.65 insulin requiring     hydrochlorothiazide 25 MG tablet  Commonly known as:  HYDRODIURIL  Take 1 tablet (25 mg total) by mouth daily.     insulin aspart 100 UNIT/ML injection  Commonly known as:  novoLOG  Inject 8 Units into the skin 2 (two) times daily.     Insulin Glargine 100 UNIT/ML Solostar Pen  Commonly known as:  LANTUS SOLOSTAR  Inject 0.40 mLs (40 Units total) into the skin at bedtime     Insulin Pen Needle 31G X 5 MM Misc  Commonly known as:  B-D UF III MINI PEN NEEDLES  USE TO INJECT insulin THREE TIMES DAILY     lovastatin 40 MG tablet  Commonly known as:  MEVACOR  Take 1 tablet (40 mg total) by mouth at bedtime.     meloxicam 7.5 MG tablet  Commonly known as:  MOBIC  Take 1 tablet (7.5 mg total) by mouth daily.     metFORMIN 500 MG tablet  Commonly known as:  GLUCOPHAGE  Take 1,000 mg by mouth 2 (two) times daily with a meal.     ondansetron 4 MG tablet  Commonly known as:  ZOFRAN  Take 1 tablet (  4 mg total) by mouth every 6 (six) hours.     pantoprazole 40 MG tablet  Commonly known as:  PROTONIX  Take 1 tablet (40 mg total) by mouth daily.     Vitamin D (Ergocalciferol) 50000 UNITS Caps capsule  Commonly known as:  DRISDOL  Take 50,000 Units by mouth every 7 (seven) days. Every Sunday        Disposition and follow-up:   Mr.Hazael L Dillenbeck was discharged from [facility] in Good condition.  At the hospital follow up visit please address:  1.  Blood sugar control on lower dose of insulin, tobacco and alcohol use.   2.  Labs / imaging needed at time of follow-up: BMP, consider magnesium  3.  Pending labs/ test needing follow-up: C-peptide (checked while hypoglycemic), insulin (unfortuantely checked after sugars recovered), sulfonylurea panel   Follow-up Appointments: Follow-up Information    Follow up with Juluis Mire, MD. Daphane Shepherd on 09/16/2014.   Specialty:  Internal Medicine   Why:  at 8:45am for hospital follow up   Contact information:   [address] [place] [id number] [phone]       Discharge Instructions:   Admission HPI: [name] is a 59 y.o. male with a h/o of chronic pancreatitis secondary to alcoholism, T2DM and HTN who presents with new onset seizures and hypoglycemia. Patient felt dizzy this morning after taking his 65 units of Lantus QHS last night, taking novolog 20 units this morning and skipping breakfast. He was in line at [place] when an employee noticed he stopped talking and started twitching. The employee was able to lie patient down on the floor, and patient had tonic clonic movements for 1 min, though he reports he never lost consciousness. On EMS arrival patient was unresponsive, drooling, snoring, BG 33. Patient was given D50, with a repeat CBG of 181.On arrival to the ED, patient was alert and oriented x4, in NAD, and reported no pain, SOB, or n/v. He received 115m of D50, then was started on D10 at 125cc/hr with BG levels in the 50s-80s. Of note, patient has been to the ED 7 times in the last 6 months with complains of hypoglycemia or pain. [facility] "[facility]," had trouble getting up and was found to have a BG of 23. He was not admitted, was told to follow up with his PCP but has not done so.   Hospital Course by problem list: Active Problems:   Diabetes mellitus type 2, uncontrolled   HTN (hypertension)   Hypoglycemia   Hypoglycemia associated with type 2 diabetes mellitus   Pancreatic insufficiency   Hypokalemia   Hypomagnesemia   Hypomagnesemia   Severe hypoglycemia in uncontrolled Type 2 diabeties: Isolated seizure like activity due to hypoglycemia given BG of 33 on EMS arrival. On chart review, patient has had multiple episodes of hypoglycemia over the past 6 months, likely due to excessive insulin and glipizide doses. Patient received 1559mof D50 in the ED, then was started on D10 at 125cc/hr. CBGs were checked every 2 hours, and continuously  increased from 50s to 220s, them D10 was discontinued. Patient was on 105 units of insulin at home; we reduced this to  Lantus 40u qhs and mealtime insulin to 8u on discharge, and stopped glipizide. C-peptide, insulin, and sulfonylurea panel pending.  Chronic pancreatitis: per 03/20/2014 office visit note, patient has violated multiple pain contracts with multiple clinics.  This coupled with the fact that patient has started to use alcohol and marijuana again may be a hindrance to him being approved by any other provider for long term management. We continued home Creon and gave patient tramadol PRN for pain while hospitalized.    Hypokalemia: potassium 2.9 on admission, likely due to excess insulin. Patient received 164mEq of K during this hospitalization. Magnesium borderline low at 1.6, and patient received magnesium 2g IV. He did not want to wait for a repeat BMP on the day of discharge so this will be followed up as an outpatient.  HTN: We held home HCTZ due to hypokalemia on 8/16 but restarted it on 8/17. BP was stable in the 120s/70s with the exception of one elevated value of 176/91.  Please follow up blood pressure and potassium levels at outpatient follow up.  Bipolar disorder: Continued home aripiprazole $RemoveBeforeDEI'22mg'TiumficIAACcdZXf$  and fluoxetine $RemoveBefore'40mg'oOYeZFjQQUkgQ$  daily. Mood was stable during hospitalization, no SI/HI.   GERD: Continue home pantoprazole   Discharge Vitals:   BP 125/76 mmHg  Pulse 79  Temp(Src) 97.6 F (36.4 C) (Oral)  Resp 18  Ht $R'5\' 4"'vk$  (1.626 m)  Wt 160 lb 8 oz (72.802 kg)  BMI 27.54 kg/m2  SpO2 65%  Discharge Labs:  Results for orders placed or performed during the hospital encounter of 09/08/14 (from the past 24 hour(s))  CBG monitoring, ED     Status: None   Collection Time: 09/08/14 11:31 AM  Result Value Ref Range   Glucose-Capillary 82 65 - 99 mg/dL  CBC - if new onset seizures     Status: Abnormal   Collection Time: 09/08/14 12:08 PM  Result Value Ref Range   WBC 8.6 4.0 - 10.5 K/uL     RBC 4.18 (L) 4.22 - 5.81 MIL/uL   Hemoglobin 14.3 13.0 - 17.0 g/dL   HCT 41.4 39.0 - 52.0 %   MCV 99.0 78.0 - 100.0 fL   MCH 34.2 (H) 26.0 - 34.0 pg   MCHC 34.5 30.0 - 36.0 g/dL   RDW 15.8 (H) 11.5 - 15.5 %   Platelets 167 150 - 400 K/uL  Comprehensive metabolic panel     Status: Abnormal   Collection Time: 09/08/14 12:08 PM  Result Value Ref Range   Sodium 142 135 - 145 mmol/L   Potassium 2.9 (L) 3.5 - 5.1 mmol/L   Chloride 106 101 - 111 mmol/L   CO2 23 22 - 32 mmol/L   Glucose, Bld 80 65 - 99 mg/dL   BUN 8 6 - 20 mg/dL   Creatinine, Ser 0.85 0.61 - 1.24 mg/dL   Calcium 8.4 (L) 8.9 - 10.3 mg/dL   Total Protein 5.5 (L) 6.5 - 8.1 g/dL   Albumin 3.1 (L) 3.5 - 5.0 g/dL   AST 66 (H) 15 - 41 U/L   ALT 29 17 - 63 U/L   Alkaline Phosphatase 89 38 - 126 U/L   Total Bilirubin 0.4 0.3 - 1.2 mg/dL   GFR calc non Af Amer >60 >60 mL/min   GFR calc Af Amer >60 >60 mL/min   Anion gap 13 5 - 15  Ethanol     Status: None   Collection Time: 09/08/14 12:08 PM  Result Value Ref Range   Alcohol, Ethyl (B) <5 <5 mg/dL  CBG monitoring, ED (now and then every hour for 3 hours)     Status: Abnormal   Collection Time: 09/08/14 12:31 PM  Result Value Ref Range   Glucose-Capillary 38 (LL) 65 -  99 mg/dL  CBG monitoring, ED (now and then every hour for 3 hours)     Status: None   Collection Time: 09/08/14 12:54 PM  Result Value Ref Range   Glucose-Capillary 87 65 - 99 mg/dL  CBG monitoring, ED (now and then every hour for 3 hours)     Status: None   Collection Time: 09/08/14  1:37 PM  Result Value Ref Range   Glucose-Capillary 66 65 - 99 mg/dL  CBG monitoring, ED     Status: Abnormal   Collection Time: 09/08/14  2:34 PM  Result Value Ref Range   Glucose-Capillary 35 (LL) 65 - 99 mg/dL  Glucose, capillary     Status: Abnormal   Collection Time: 09/08/14  2:34 PM  Result Value Ref Range   Glucose-Capillary 35 (LL) 65 - 99 mg/dL  CBG monitoring, ED     Status: Abnormal   Collection Time:  09/08/14  2:40 PM  Result Value Ref Range   Glucose-Capillary 43 (LL) 65 - 99 mg/dL  CBG monitoring, ED     Status: Abnormal   Collection Time: 09/08/14  3:05 PM  Result Value Ref Range   Glucose-Capillary 56 (L) 65 - 99 mg/dL  CBG monitoring, ED     Status: Abnormal   Collection Time: 09/08/14  3:50 PM  Result Value Ref Range   Glucose-Capillary 62 (L) 65 - 99 mg/dL  Ethanol     Status: None   Collection Time: 09/08/14  3:51 PM  Result Value Ref Range   Alcohol, Ethyl (B) <5 <5 mg/dL  Basic metabolic panel     Status: Abnormal   Collection Time: 09/08/14  3:51 PM  Result Value Ref Range   Sodium 140 135 - 145 mmol/L   Potassium 3.0 (L) 3.5 - 5.1 mmol/L   Chloride 105 101 - 111 mmol/L   CO2 26 22 - 32 mmol/L   Glucose, Bld 55 (L) 65 - 99 mg/dL   BUN 7 6 - 20 mg/dL   Creatinine, Ser 0.66 0.61 - 1.24 mg/dL   Calcium 8.3 (L) 8.9 - 10.3 mg/dL   GFR calc non Af Amer >60 >60 mL/min   GFR calc Af Amer >60 >60 mL/min   Anion gap 9 5 - 15  CBG monitoring, ED     Status: None   Collection Time: 09/08/14  4:46 PM  Result Value Ref Range   Glucose-Capillary 70 65 - 99 mg/dL  Glucose, capillary     Status: Abnormal   Collection Time: 09/08/14  5:55 PM  Result Value Ref Range   Glucose-Capillary 52 (L) 65 - 99 mg/dL  Glucose, capillary     Status: None   Collection Time: 09/08/14  6:21 PM  Result Value Ref Range   Glucose-Capillary 93 65 - 99 mg/dL  Glucose, capillary     Status: Abnormal   Collection Time: 09/08/14  7:58 PM  Result Value Ref Range   Glucose-Capillary 175 (H) 65 - 99 mg/dL  Magnesium     Status: Abnormal   Collection Time: 09/08/14  8:16 PM  Result Value Ref Range   Magnesium 1.6 (L) 1.7 - 2.4 mg/dL  Glucose, capillary     Status: Abnormal   Collection Time: 09/08/14 10:30 PM  Result Value Ref Range   Glucose-Capillary 224 (H) 65 - 99 mg/dL  Basic metabolic panel     Status: Abnormal   Collection Time: 09/09/14  5:38 AM  Result Value Ref Range   Sodium 133  (L) 135 -  145 mmol/L   Potassium 2.8 (L) 3.5 - 5.1 mmol/L   Chloride 97 (L) 101 - 111 mmol/L   CO2 27 22 - 32 mmol/L   Glucose, Bld 106 (H) 65 - 99 mg/dL   BUN <5 (L) 6 - 20 mg/dL   Creatinine, Ser 0.56 (L) 0.61 - 1.24 mg/dL   Calcium 8.0 (L) 8.9 - 10.3 mg/dL   GFR calc non Af Amer >60 >60 mL/min   GFR calc Af Amer >60 >60 mL/min   Anion gap 9 5 - 15  CBC     Status: None   Collection Time: 09/09/14  5:38 AM  Result Value Ref Range   WBC 8.8 4.0 - 10.5 K/uL   RBC 4.38 4.22 - 5.81 MIL/uL   Hemoglobin 14.9 13.0 - 17.0 g/dL   HCT 42.1 39.0 - 52.0 %   MCV 96.1 78.0 - 100.0 fL   MCH 34.0 26.0 - 34.0 pg   MCHC 35.4 30.0 - 36.0 g/dL   RDW 14.9 11.5 - 15.5 %   Platelets 159 150 - 400 K/uL    Signed: Lucious Groves, DO 09/09/2014, 2:16 PM    Services Ordered on Discharge: None   Equipment Ordered on Discharge: None

## 2014-09-09 NOTE — Care Management Note (Addendum)
Case Management Note  Patient Details  Name: Michael [name] MRN: [id number] Date of Birth: 1955/03/08  Subjective/Objective:    Patient lives in [place], he states he takes the bus for transportation, he has medicaid and he follows up with internal medicine clinic.  PTA indep.  No needs.   Patient asked for bus pass, CSW gave patient bus pass.              Action/Plan:   Expected Discharge Date:                  Expected Discharge Plan:  Home/Self Care  In-House Referral:     Discharge planning Services  CM Consult  Post Acute Care Choice:    Choice offered to:     DME Arranged:    DME Agency:     HH Arranged:    [place] Agency:     Status of Service:  Completed, signed off  Medicare Important Message Given:    Date Medicare IM Given:    Medicare IM give by:    Date Additional Medicare IM Given:    Additional Medicare Important Message give by:     If discussed at [place] of Stay Meetings, dates discussed:    Additional Comments:  Zenon Mayo, RN 09/09/2014, 10:54 AM

## 2014-09-09 NOTE — Progress Notes (Signed)
Subjective:    NAEON. Patient feels better this morning and would like to go home. No dizziness, no shaking. We discussed the importance of taking mealtime insulin with meals.    Objective:    Vital Signs:   Temp:  [97.6 F (36.4 C)-98.7 F (37.1 C)] 97.6 F (36.4 C) (08/17 0639) Pulse Rate:  [79-113] 79 (08/17 0639) Resp:  [16-31] 18 (08/17 0639) BP: (125-176)/(74-95) 125/76 mmHg (08/17 0639) SpO2:  [65 %-100 %] 65 % (08/17 0639) Weight:  [72.802 kg (160 lb 8 oz)] 72.802 kg (160 lb 8 oz) (08/16 1750) Last BM Date: 09/08/14  24-hour weight change: Weight change:   Intake/Output:  No intake or output data in the 24 hours ending 09/09/14 1039    Physical Exam: General: Well-developed, well-nourished, in no acute distress; alert, appropriate and cooperative throughout examination.  Lungs:  Normal respiratory effort. Clear to auscultation BL without crackles or wheezes.  Heart: RRR. S1 and S2 normal without gallop, murmur, or rubs.  Abdomen:  BS normoactive. Soft, Nondistended, non-tender.  No masses or organomegaly.  Extremities: No pretibial edema.     Labs:  Basic Metabolic Panel:  Recent Labs Lab 09/08/14 1208 09/08/14 1551 09/08/14 2016 09/09/14 0538  NA 142 140  --  133*  K 2.9* 3.0*  --  2.8*  CL 106 105  --  97*  CO2 23 26  --  27  GLUCOSE 80 55*  --  106*  BUN 8 7  --  <5*  CREATININE 0.85 0.66  --  0.56*  CALCIUM 8.4* 8.3*  --  8.0*  MG  --   --  1.6*  --     Liver Function Tests:  Recent Labs Lab 09/08/14 1208  AST 66*  ALT 29  ALKPHOS 89  BILITOT 0.4  PROT 5.5*  ALBUMIN 3.1*   No results for input(s): LIPASE, AMYLASE in the last 168 hours. No results for input(s): AMMONIA in the last 168 hours.  CBC:  Recent Labs Lab 09/08/14 1208 09/09/14 0538  WBC 8.6 8.8  HGB 14.3 14.9  HCT 41.4 42.1  MCV 99.0 96.1  PLT 167 159    CBG:  Recent Labs Lab 09/08/14 1646 09/08/14 1755 09/08/14 1821 09/08/14 1958 09/08/14 2230    GLUCAP 70 64* 28 175* 83*    Microbiology: Results for orders placed or performed during the hospital encounter of 02/02/11  MRSA PCR Screening     Status: None   Collection Time: 02/03/11  6:38 AM  Result Value Ref Range Status   MRSA by PCR NEGATIVE NEGATIVE Final    Comment:        The GeneXpert MRSA Assay (FDA approved for NASAL specimens only), is one component of a comprehensive MRSA colonization surveillance program. It is not intended to diagnose MRSA infection nor to guide or monitor treatment for MRSA infections.    Coagulation Studies: No results for input(s): LABPROT, INR in the last 72 hours.   Other results: EKG on admission: sinus tachycardia at 114 BPM, PR interval 130 ms, QRS duration 69 ms, QT/QTc 343/472 ms    Medications:    Infusions:    Scheduled Medications: . ARIPiprazole  22 mg Oral QHS  . aspirin EC  81 mg Oral QHS  . enoxaparin (LOVENOX) injection  40 mg Subcutaneous Q24H  . hydrochlorothiazide  25 mg Oral Daily  . insulin aspart  8 Units Subcutaneous TID WC  . insulin glargine  40 Units Subcutaneous QHS  . lipase/protease/amylase  36,000 Units Oral TID  . magnesium sulfate 1 - 4 g bolus IVPB  2 g Intravenous Once  . pantoprazole  40 mg Oral Daily  . potassium chloride  40 mEq Oral Q4H    PRN Medications: traMADol   Assessment/ Plan:    [name] is a 59 y.o. male with chronic pancreatitis secondary to alcoholism, chronic pain, T2DM and HTN who presents with new onset seizures in the context of hypoglycemia.  New onset seizure, hypoglycemia: Isolated seizure likely due to hypoglycemia given BG of 33 on EMS arrival. Patient's blood glucose levels have normalized this morning. On chart review, patient has had multiple episodes of hypoglycemia over the past 6 months, likely due to excessive insulin and glipizide doses.  -reduce Lantus to 40u qhs, reduce mealtime insulin to 8u on discharge -continue metformin -C-peptide,  insulin, and sulfonylurea panel pending  -Follow up appointment with Dr Naaman Plummer in 1 week  Chronic pancreatitis: per 03/20/2014 office visit note, patient has violated multiple pain contracts with multiple clinics. This coupled with the fact that patient has started to use alcohol and marijuana again may be a hindrance to him being approved by any other provider for long term management.  -tramadol for pain  -continue home Creon -counsel on alcohol and tobacco cessation  Hypokalemia: potassium 2.9 on admission, likely due to excess insulin. Patient received 149mEq of K during this hospitalization. Magnesium borderline low at 1.6. -administer magnesium 2g IV -recheck BMP prior to discharge  HTN: stable in the 120s/70s, though patient had an elevated BP of 176/91 overnight  -restart home HCTZ  Bipolar disorder: Mood stable, no SI/HI.  -continue home aripiprazole 22mg  and fluoxetine 40mg  daily   GERD: Continue home pantoprazole   Diet: regular  DVT Ppx: LMWH   Dispo: Likely discharge later today after repeat BMP  Length of Stay: 1 day(s)  This is a Careers information officer Note.  The care of the patient was discussed with Dr. Heber [place] and the assessment and plan formulated with their assistance.  Please see their attached note for official documentation of the daily encounter.   Kathi Simpers, MS4 Pager: [phone] (7AM-5PM) 09/09/2014, 10:39 AM

## 2014-09-10 LAB — INSULIN, RANDOM: INSULIN: 1.4 u[IU]/mL — AB (ref 2.6–24.9)

## 2014-09-10 LAB — C-PEPTIDE: C-Peptide: 0.2 ng/mL — ABNORMAL LOW (ref 1.1–4.4)

## 2014-09-16 ENCOUNTER — Ambulatory Visit: Payer: Medicaid Other | Admitting: Internal Medicine

## 2014-09-25 ENCOUNTER — Encounter: Payer: Self-pay | Admitting: Internal Medicine

## 2014-09-25 ENCOUNTER — Encounter: Payer: Medicaid Other | Admitting: Internal Medicine

## 2014-10-08 ENCOUNTER — Other Ambulatory Visit: Payer: Self-pay | Admitting: Internal Medicine

## 2014-10-08 ENCOUNTER — Other Ambulatory Visit: Payer: Self-pay | Admitting: Licensed Clinical Social Worker

## 2014-10-08 ENCOUNTER — Encounter (HOSPITAL_COMMUNITY): Payer: Self-pay | Admitting: Emergency Medicine

## 2014-10-08 ENCOUNTER — Inpatient Hospital Stay (HOSPITAL_COMMUNITY)
Admission: EM | Admit: 2014-10-08 | Discharge: 2014-10-15 | DRG: 918 | Disposition: A | Discharge status: Home / Self Care | Payer: Medicaid Other | Attending: Student in an Organized Health Care Education/Training Program | Admitting: Student in an Organized Health Care Education/Training Program

## 2014-10-08 DIAGNOSIS — I10 Essential (primary) hypertension: Secondary | ICD-10-CM | POA: Diagnosis present

## 2014-10-08 DIAGNOSIS — B351 Tinea unguium: Secondary | ICD-10-CM | POA: Diagnosis present

## 2014-10-08 DIAGNOSIS — K219 Gastro-esophageal reflux disease without esophagitis: Secondary | ICD-10-CM | POA: Diagnosis present

## 2014-10-08 DIAGNOSIS — I959 Hypotension, unspecified: Secondary | ICD-10-CM | POA: Diagnosis not present

## 2014-10-08 DIAGNOSIS — Z789 Other specified health status: Secondary | ICD-10-CM | POA: Insufficient documentation

## 2014-10-08 DIAGNOSIS — E559 Vitamin D deficiency, unspecified: Secondary | ICD-10-CM | POA: Diagnosis present

## 2014-10-08 DIAGNOSIS — F10239 Alcohol dependence with withdrawal, unspecified: Secondary | ICD-10-CM

## 2014-10-08 DIAGNOSIS — R569 Unspecified convulsions: Secondary | ICD-10-CM

## 2014-10-08 DIAGNOSIS — T383X1A Poisoning by insulin and oral hypoglycemic [antidiabetic] drugs, accidental (unintentional), initial encounter: Principal | ICD-10-CM | POA: Diagnosis present

## 2014-10-08 DIAGNOSIS — IMO0002 Reserved for concepts with insufficient information to code with codable children: Secondary | ICD-10-CM

## 2014-10-08 DIAGNOSIS — Z7289 Other problems related to lifestyle: Secondary | ICD-10-CM | POA: Insufficient documentation

## 2014-10-08 DIAGNOSIS — F319 Bipolar disorder, unspecified: Secondary | ICD-10-CM | POA: Diagnosis present

## 2014-10-08 DIAGNOSIS — Z781 Physical restraint status: Secondary | ICD-10-CM

## 2014-10-08 DIAGNOSIS — K861 Other chronic pancreatitis: Secondary | ICD-10-CM | POA: Diagnosis present

## 2014-10-08 DIAGNOSIS — E1165 Type 2 diabetes mellitus with hyperglycemia: Secondary | ICD-10-CM

## 2014-10-08 DIAGNOSIS — Z23 Encounter for immunization: Secondary | ICD-10-CM

## 2014-10-08 DIAGNOSIS — F10939 Alcohol use, unspecified with withdrawal, unspecified: Secondary | ICD-10-CM

## 2014-10-08 DIAGNOSIS — E876 Hypokalemia: Secondary | ICD-10-CM | POA: Diagnosis present

## 2014-10-08 DIAGNOSIS — Z794 Long term (current) use of insulin: Secondary | ICD-10-CM

## 2014-10-08 DIAGNOSIS — Z7982 Long term (current) use of aspirin: Secondary | ICD-10-CM

## 2014-10-08 DIAGNOSIS — E11649 Type 2 diabetes mellitus with hypoglycemia without coma: Secondary | ICD-10-CM | POA: Diagnosis present

## 2014-10-08 DIAGNOSIS — Z791 Long term (current) use of non-steroidal anti-inflammatories (NSAID): Secondary | ICD-10-CM

## 2014-10-08 DIAGNOSIS — E162 Hypoglycemia, unspecified: Secondary | ICD-10-CM

## 2014-10-08 DIAGNOSIS — F17210 Nicotine dependence, cigarettes, uncomplicated: Secondary | ICD-10-CM | POA: Diagnosis present

## 2014-10-08 DIAGNOSIS — F10231 Alcohol dependence with withdrawal delirium: Secondary | ICD-10-CM | POA: Diagnosis present

## 2014-10-08 DIAGNOSIS — W19XXXA Unspecified fall, initial encounter: Secondary | ICD-10-CM

## 2014-10-08 DIAGNOSIS — G40919 Epilepsy, unspecified, intractable, without status epilepticus: Secondary | ICD-10-CM | POA: Diagnosis present

## 2014-10-08 DIAGNOSIS — E785 Hyperlipidemia, unspecified: Secondary | ICD-10-CM | POA: Diagnosis present

## 2014-10-08 LAB — I-STAT CG4 LACTIC ACID, ED: Lactic Acid, Venous: 2.5 mmol/L (ref 0.5–2.0)

## 2014-10-08 LAB — ETHANOL: Alcohol, Ethyl (B): <5 mg/dL (ref <5)

## 2014-10-08 LAB — CBG MONITORING, ED
GLUCOSE-CAPILLARY: 19 mg/dL — AB (ref 65–99)
Glucose-Capillary: 95 mg/dL (ref 65–99)

## 2014-10-08 MED ORDER — DEXTROSE 50 % IV SOLN
INTRAVENOUS | Status: AC
Start: 2014-10-08 — End: 2014-10-09
  Filled 2014-10-08: qty 50

## 2014-10-08 MED ORDER — DEXTROSE 10 % IV SOLN
INTRAVENOUS | Status: DC
  Administered 2014-10-08: via INTRAVENOUS

## 2014-10-08 MED ORDER — DEXTROSE 5 % IV SOLN: Freq: Once | INTRAVENOUS | Status: DC

## 2014-10-08 MED ORDER — DEXTROSE 50 % IV SOLN
1.0000 | Freq: Once | INTRAVENOUS | Status: AC
  Administered 2014-10-08 (×2): 50 mL via INTRAVENOUS

## 2014-10-08 MED ORDER — DEXTROSE 50 % IV SOLN
INTRAVENOUS | Status: AC
  Administered 2014-10-08: 50 mL via INTRAVENOUS
  Filled 2014-10-08: qty 50

## 2014-10-08 MED ORDER — THIAMINE HCL 100 MG/ML IJ SOLN
100.0000 mg | Freq: Once | INTRAMUSCULAR | Status: AC
  Administered 2014-10-08: 100 mg via INTRAVENOUS
  Filled 2014-10-08: qty 2

## 2014-10-08 MED ORDER — DEXTROSE-NACL 5-0.45 % IV SOLN
INTRAVENOUS | Status: DC
  Administered 2014-10-08: 23:00:00 via INTRAVENOUS

## 2014-10-08 MED ORDER — GLUCOSE BLOOD VI STRP: ORAL_STRIP | Status: DC

## 2014-10-08 MED ORDER — DEXTROSE 50 % IV SOLN
1.0000 | Freq: Once | INTRAVENOUS | Status: AC
  Administered 2014-10-08: 50 mL via INTRAVENOUS

## 2014-10-08 NOTE — ED Notes (Signed)
Pt CBG was checked and it is Lo. Nurse and doctor notified.

## 2014-10-08 NOTE — Telephone Encounter (Signed)
Pt placed call to CSW stating he had just called [facility] and his strips have not been called in yet.  CSW informed [name] physicians have 48hrs to respond to refill request and request is first routed to Triage/refill RN first.  Pt acknowledge understanding and states he has an appointment tomorrow.  CSW encouraged pt to notify nurse and physician of all needed refills during appointment.

## 2014-10-08 NOTE — ED Notes (Signed)
Patient is a diabetic, from [facility], CBG of 31, 1 mg of glucagon and given one amp D50 by [facility], CBG up to 111 and dropped down to 95 upon arrival to ED.  Patient with snoring respirations upon arrival.  MD at bedside with D50 orders.  ETOH on board per EMS.  Patient did have two seizures per EMS and was given 5mg  Versed IM.  Patient with a GCS of 10 per EMS on arrival.  Patient has been tachy en route to ED.

## 2014-10-08 NOTE — Telephone Encounter (Signed)
Michael [name] placed call to CSW.  Pt states he has been attempting to contact [facility].  Michael [name] requesting a prescription for his Accu-check test strips to be sent to [facility] on [address].  Pt notified this call will be placed on chart and forwarded to medication refill nurse.

## 2014-10-08 NOTE — ED Provider Notes (Signed)
CSN: [id number]     Arrival date & time 10/08/14  2142 History   First MD Initiated Contact with Patient 10/08/14 2209     Chief Complaint  Patient presents with  . Altered Mental Status     (Consider location/radiation/quality/duration/timing/severity/associated sxs/prior Treatment) Patient is a 59 y.o. male presenting with seizures. The history is provided by a relative, the EMS personnel and a friend.  Seizures Seizure activity on arrival: no   Seizure type:  Grand mal Initial focality:  None Episode characteristics: abnormal movements, confusion, disorientation, generalized shaking and unresponsiveness   Postictal symptoms: confusion and somnolence   Return to baseline: no   Severity:  Moderate Duration:  5 minutes Timing:  Clustered Number of seizures this episode:  5 Progression:  Partially resolved Context comment:  Hypoglycemia, self-administered insulin today Recent head injury:  No recent head injuries PTA treatment:  Midazolam History of seizures: yes (with previous hypoglycemia episodes)   Severity:  Moderate   Past Medical History  Diagnosis Date  . Chronic pancreatitis   . Smoker   . Gastritis due to alcohol without hemorrhage   . HTN (hypertension)   . Pancreatic lesion     appears innocent and related to chronic pancreatitis  . Heavy alcohol use     hx of  . Marijuana use     hx of  . Migraine   . Depression   . Helicobacter pylori gastritis 03/27/2011    on endoscopy, treated with omeprazole, clarithromycin and amoxicillin  . Insomnia   . ACE inhibitor-aggravated angioedema   . Sessile colonic polyp   . Diabetes mellitus   . GERD (gastroesophageal reflux disease)   . Chronic abdominal pain on narcotics 06/27/2012  . Seizures    Past Surgical History  Procedure Laterality Date  . Esophagogastroduodenoscopy  03/22/2011    Procedure: ESOPHAGOGASTRODUODENOSCOPY (EGD);  Surgeon: Gatha Mayer, MD;  Location: [facility];  Service: Endoscopy;   Laterality: N/A;  egd with balloon   . Balloon dilation  03/22/2011    Procedure: BALLOON DILATION;  Surgeon: Gatha Mayer, MD;  Location: [facility];  Service: Endoscopy;  Laterality: N/A;  . Eus  04/20/2011    Procedure: UPPER ENDOSCOPIC ULTRASOUND (EUS) LINEAR;  Surgeon: Milus Banister, MD;  Location: [facility];  Service: Endoscopy;  Laterality: N/A;  . Colonoscopy w/ biopsies and polypectomy  09/12/11  . Colonoscopy N/A 05/22/2012    Procedure: COLONOSCOPY;  Surgeon: Gatha Mayer, MD;  Location: [place];  Service: Endoscopy;  Laterality: N/A;   Family History  Problem Relation Age of Onset  . Colon cancer Neg Hx   . Stomach cancer Neg Hx   . Anesthesia problems Neg Hx   . Hypotension Neg Hx   . Pseudochol deficiency Neg Hx   . Malignant hyperthermia Neg Hx   . Hypertension Sister   . Diabetes Sister   . Heart disease Sister    Social History  Substance Use Topics  . Smoking status: Current Every Day Smoker -- 0.60 packs/day for 30 years    Types: Cigarettes  . Smokeless tobacco: Never Used  . Alcohol Use: 9.6 oz/week    6 Cans of beer, 10 Standard drinks or equivalent per week    Review of Systems  Unable to perform ROS: Mental status change  Neurological: Positive for seizures.      Allergies  Ace inhibitors and Losartan  Home Medications   Prior to Admission medications   Medication Sig Start Date End Date Taking?  Authorizing Provider  ARIPiprazole (ABILIFY) 15 MG tablet Take 15 mg by mouth at bedtime. Takes with 5 mg and 2 mg for a 22 mg dose   Yes Historical Provider, MD  ARIPiprazole (ABILIFY) 2 MG tablet Take 2 mg by mouth at bedtime. Takes with 15 mg and 5 mg for a 22 mg dose   Yes Historical Provider, MD  ARIPiprazole (ABILIFY) 5 MG tablet Take 5 mg by mouth at bedtime. Takes with 15 mg and 2 mg for a 22 mg dose   Yes Historical Provider, MD  aspirin EC 81 MG tablet Take 81 mg by mouth at bedtime.   Yes Historical Provider, MD  Blood Glucose  Monitoring Suppl (ACCU-CHEK NANO SMARTVIEW) W/DEVICE KIT 1 each by Does not apply route 3 (three) times daily. Dx code 250.00 insulin requiring 10/07/12  Yes Dominic Pea, DO  FLUoxetine (PROZAC) 20 MG capsule Take 20 mg by mouth 2 (two) times daily.    Yes Historical Provider, MD  glucose blood (ACCU-CHEK SMARTVIEW) test strip Check blood sugar 3 times a day before meals and bedtime Dx code E11.65 insulin requiring 10/08/14  Yes Marjan Rabbani, MD  hydrochlorothiazide (HYDRODIURIL) 25 MG tablet Take 1 tablet (25 mg total) by mouth daily. 05/14/14  Yes Jerrye Noble, MD  insulin aspart (NOVOLOG) 100 UNIT/ML injection Inject 8 Units into the skin 2 (two) times daily. 09/09/14  Yes Lucious Groves, DO  Insulin Glargine (LANTUS SOLOSTAR) 100 UNIT/ML Solostar Pen Inject 0.40 mLs (40 Units total) into the skin at bedtime 09/09/14  Yes Lucious Groves, DO  Insulin Pen Needle (B-D UF III MINI PEN NEEDLES) 31G X 5 MM MISC USE TO INJECT insulin THREE TIMES DAILY 08/21/14  Yes Juluis Mire, MD  lovastatin (MEVACOR) 40 MG tablet Take 1 tablet (40 mg total) by mouth at bedtime. 03/20/14  Yes Jerrye Noble, MD  meloxicam (MOBIC) 7.5 MG tablet Take 1 tablet (7.5 mg total) by mouth daily. 05/14/14  Yes Jerrye Noble, MD  metFORMIN (GLUCOPHAGE) 500 MG tablet Take 1,000 mg by mouth 2 (two) times daily with a meal.   Yes Historical Provider, MD  ondansetron (ZOFRAN) 4 MG tablet Take 1 tablet (4 mg total) by mouth every 6 (six) hours. 05/14/14  Yes Jerrye Noble, MD  Pancrelipase, Lip-Prot-Amyl, (CREON) 36000 UNITS CPEP Take 36,000 Units by mouth 3 (three) times daily.   Yes Historical Provider, MD  pantoprazole (PROTONIX) 40 MG tablet Take 1 tablet (40 mg total) by mouth daily. 02/07/13  Yes Jerrye Noble, MD  Vitamin D, Ergocalciferol, (DRISDOL) 50000 UNITS CAPS capsule Take 50,000 Units by mouth every 7 (seven) days. Every Sunday   Yes Historical Provider, MD   BP 149/76 mmHg  Pulse 92  Temp(Src) 97.5 F (36.4 C) (Axillary)   Resp 15  SpO2 96% Physical Exam  Constitutional: He appears well-developed and well-nourished. He appears lethargic. He has a sickly appearance. No distress.  HENT:  Head: Normocephalic and atraumatic.  Eyes: Conjunctivae are normal.  Dysconjugate gaze  Neck: Neck supple. No tracheal deviation present.  Cardiovascular: Normal rate and regular rhythm.   Pulmonary/Chest: Effort normal. No respiratory distress.  Abdominal: Soft. He exhibits no distension.  Neurological: He appears lethargic. He is disoriented. GCS eye subscore is 2. GCS verbal subscore is 3. GCS motor subscore is 5.  Skin: Skin is warm and dry.  Psychiatric: His speech is slurred.    ED Course  Procedures (including critical care time) CRITICAL CARE Performed by:  Leo Grosser Total critical care time: 75 minutes Critical care time was exclusive of separately billable procedures and treating other patients. Critical care was necessary to treat or prevent imminent or life-threatening deterioration. Critical care was time spent personally by me on the following activities: development of treatment plan with patient and/or surrogate as well as nursing, discussions with consultants, evaluation of patient's response to treatment, examination of patient, obtaining history from patient or surrogate, ordering and performing treatments and interventions, ordering and review of laboratory studies, ordering and review of radiographic studies, pulse oximetry and re-evaluation of patient's condition.   Labs Review Labs Reviewed  CBC WITH DIFFERENTIAL/PLATELET - Abnormal; Notable for the following:    WBC 10.7 (*)    MCH 35.1 (*)    MCHC 36.1 (*)    Platelets 105 (*)    Neutro Abs 9.1 (*)    All other components within normal limits  COMPREHENSIVE METABOLIC PANEL - Abnormal; Notable for the following:    Potassium 2.3 (*)    Chloride 98 (*)    Glucose, Bld <20 (*)    BUN <5 (*)    Calcium 8.7 (*)    AST 237 (*)    ALT 82  (*)    Alkaline Phosphatase 169 (*)    All other components within normal limits  CK - Abnormal; Notable for the following:    Total CK 4667 (*)    All other components within normal limits  I-STAT CG4 LACTIC ACID, ED - Abnormal; Notable for the following:    Lactic Acid, Venous 2.50 (*)    All other components within normal limits  CBG MONITORING, ED - Abnormal; Notable for the following:    Glucose-Capillary <10 (*)    All other components within normal limits  CBG MONITORING, ED - Abnormal; Notable for the following:    Glucose-Capillary 19 (*)    All other components within normal limits  CBG MONITORING, ED - Abnormal; Notable for the following:    Glucose-Capillary 241 (*)    All other components within normal limits  CBG MONITORING, ED - Abnormal; Notable for the following:    Glucose-Capillary 105 (*)    All other components within normal limits  CBG MONITORING, ED - Abnormal; Notable for the following:    Glucose-Capillary 50 (*)    All other components within normal limits  CBG MONITORING, ED - Abnormal; Notable for the following:    Glucose-Capillary 111 (*)    All other components within normal limits  CBG MONITORING, ED - Abnormal; Notable for the following:    Glucose-Capillary 47 (*)    All other components within normal limits  ETHANOL  URINALYSIS, ROUTINE W REFLEX MICROSCOPIC (NOT AT [facility])  URINE RAPID DRUG SCREEN, HOSP PERFORMED  MAGNESIUM  CBG MONITORING, ED  CBG MONITORING, ED  I-STAT CG4 LACTIC ACID, ED    Imaging Review Ct Head Wo Contrast  10/09/2014   CLINICAL DATA:  59 year old male with altered mental status  EXAM: CT HEAD WITHOUT CONTRAST  TECHNIQUE: Contiguous axial images were obtained from the base of the skull through the vertex without intravenous contrast.  COMPARISON:  Head CT dated 01/13/2014  FINDINGS: There is mild dilatation of the ventricles and sulci compatible with age-related atrophy. Periventricular and deep white matter hypodensities  represent chronic microvascular ischemic changes. There is no intracranial hemorrhage. No mass effect or midline shift identified.  There is mild mucoperiosteal thickening of paranasal sinuses. Mastoid air cells are aerated. The calvarium is intact.  IMPRESSION: No acute intracranial pathology.  Age-related atrophy and chronic microvascular ischemic disease.  If symptoms persist and there are no contraindications, MRI may provide better evaluation if clinically indicated.   Electronically Signed   By: Anner Crete M.D.   On: 10/09/2014 01:58   I have personally reviewed and evaluated these images and lab results as part of my medical decision-making.   EKG Interpretation None      MDM   Final diagnoses:  Hypoglycemia  Seizures  Hypokalemia    59 year old male presents with ongoing seizures throughout the afternoon. He had 2 episodes, his friends that he was with tried to feed him and were unsuccessful. He had taken his insulin earlier in the day according to his family members. He had 3 further seizures at home before someone called EMS and required 5 mg of Versed in route to the hospital as well as glucagon and D50. His glucose had stabilized but he was very somnolent, postictal or showing signs of secondary injury related to hypoglycemic seizures. Patient has been admitted here for similar previous incident but resolved spontaneously prior to that admission with d50 boluses and food. While awaiting results of bloodwork the patient was unresponsive in the room except vigorous sternal rub, glucose found to be less than 10 and provided another dose of D50 with some improvement of his symptoms but continued somnolence. D5 half-normal saline infusion started for continuos glucose administration, patient too somnolent to feed orally.  CT head ordered to assess for hypoglycemic brain injury or edema. Suspect currently a combination of postictal symptoms and medication induced somnolence. Required 3+  boluses of d50 and infusion changed to d10 with close monitoring of status requiring 1 to 1 nursing care. Protecting airway. Internal medicine consulted for admission of this gentleman with likely insulin overdose of unknown amount and secondary encephalopathy/seizures.   Leo Grosser, MD 10/09/14 [phone]

## 2014-10-09 ENCOUNTER — Inpatient Hospital Stay (HOSPITAL_COMMUNITY): Payer: Medicaid Other

## 2014-10-09 ENCOUNTER — Encounter: Payer: Medicaid Other | Admitting: Internal Medicine

## 2014-10-09 ENCOUNTER — Encounter (HOSPITAL_COMMUNITY): Payer: Self-pay | Admitting: *Deleted

## 2014-10-09 DIAGNOSIS — W19XXXA Unspecified fall, initial encounter: Secondary | ICD-10-CM | POA: Diagnosis not present

## 2014-10-09 DIAGNOSIS — F17210 Nicotine dependence, cigarettes, uncomplicated: Secondary | ICD-10-CM | POA: Diagnosis present

## 2014-10-09 DIAGNOSIS — G9340 Encephalopathy, unspecified: Secondary | ICD-10-CM

## 2014-10-09 DIAGNOSIS — B351 Tinea unguium: Secondary | ICD-10-CM | POA: Diagnosis present

## 2014-10-09 DIAGNOSIS — F10231 Alcohol dependence with withdrawal delirium: Secondary | ICD-10-CM | POA: Diagnosis present

## 2014-10-09 DIAGNOSIS — Z23 Encounter for immunization: Secondary | ICD-10-CM | POA: Diagnosis not present

## 2014-10-09 DIAGNOSIS — E162 Hypoglycemia, unspecified: Secondary | ICD-10-CM | POA: Diagnosis not present

## 2014-10-09 DIAGNOSIS — Z781 Physical restraint status: Secondary | ICD-10-CM | POA: Diagnosis not present

## 2014-10-09 DIAGNOSIS — F1020 Alcohol dependence, uncomplicated: Secondary | ICD-10-CM

## 2014-10-09 DIAGNOSIS — Z794 Long term (current) use of insulin: Secondary | ICD-10-CM | POA: Diagnosis not present

## 2014-10-09 DIAGNOSIS — R4182 Altered mental status, unspecified: Secondary | ICD-10-CM | POA: Diagnosis present

## 2014-10-09 DIAGNOSIS — G40919 Epilepsy, unspecified, intractable, without status epilepticus: Secondary | ICD-10-CM | POA: Diagnosis present

## 2014-10-09 DIAGNOSIS — E559 Vitamin D deficiency, unspecified: Secondary | ICD-10-CM | POA: Diagnosis present

## 2014-10-09 DIAGNOSIS — I959 Hypotension, unspecified: Secondary | ICD-10-CM | POA: Diagnosis not present

## 2014-10-09 DIAGNOSIS — I10 Essential (primary) hypertension: Secondary | ICD-10-CM | POA: Diagnosis present

## 2014-10-09 DIAGNOSIS — F319 Bipolar disorder, unspecified: Secondary | ICD-10-CM | POA: Diagnosis present

## 2014-10-09 DIAGNOSIS — E785 Hyperlipidemia, unspecified: Secondary | ICD-10-CM | POA: Diagnosis present

## 2014-10-09 DIAGNOSIS — E876 Hypokalemia: Secondary | ICD-10-CM | POA: Diagnosis present

## 2014-10-09 DIAGNOSIS — K219 Gastro-esophageal reflux disease without esophagitis: Secondary | ICD-10-CM | POA: Diagnosis present

## 2014-10-09 DIAGNOSIS — T383X1A Poisoning by insulin and oral hypoglycemic [antidiabetic] drugs, accidental (unintentional), initial encounter: Secondary | ICD-10-CM | POA: Diagnosis present

## 2014-10-09 DIAGNOSIS — R569 Unspecified convulsions: Secondary | ICD-10-CM | POA: Diagnosis not present

## 2014-10-09 DIAGNOSIS — F10239 Alcohol dependence with withdrawal, unspecified: Secondary | ICD-10-CM | POA: Diagnosis not present

## 2014-10-09 DIAGNOSIS — E11649 Type 2 diabetes mellitus with hypoglycemia without coma: Secondary | ICD-10-CM | POA: Diagnosis present

## 2014-10-09 DIAGNOSIS — Z791 Long term (current) use of non-steroidal anti-inflammatories (NSAID): Secondary | ICD-10-CM | POA: Diagnosis not present

## 2014-10-09 DIAGNOSIS — Z7982 Long term (current) use of aspirin: Secondary | ICD-10-CM | POA: Diagnosis not present

## 2014-10-09 DIAGNOSIS — K860 Alcohol-induced chronic pancreatitis: Secondary | ICD-10-CM | POA: Diagnosis not present

## 2014-10-09 DIAGNOSIS — K861 Other chronic pancreatitis: Secondary | ICD-10-CM | POA: Diagnosis present

## 2014-10-09 LAB — GLUCOSE, CAPILLARY
GLUCOSE-CAPILLARY: 71 mg/dL (ref 65–99)
GLUCOSE-CAPILLARY: 54 mg/dL — AB (ref 65–99)
GLUCOSE-CAPILLARY: 57 mg/dL — AB (ref 65–99)
GLUCOSE-CAPILLARY: 83 mg/dL (ref 65–99)
GLUCOSE-CAPILLARY: 28 mg/dL — AB (ref 65–99)
GLUCOSE-CAPILLARY: 86 mg/dL (ref 65–99)
GLUCOSE-CAPILLARY: 52 mg/dL — AB (ref 65–99)
GLUCOSE-CAPILLARY: 120 mg/dL — AB (ref 65–99)
GLUCOSE-CAPILLARY: 50 mg/dL — AB (ref 65–99)
GLUCOSE-CAPILLARY: 74 mg/dL (ref 65–99)
GLUCOSE-CAPILLARY: 73 mg/dL (ref 65–99)
GLUCOSE-CAPILLARY: 56 mg/dL — AB (ref 65–99)
GLUCOSE-CAPILLARY: 40 mg/dL — AB (ref 65–99)
Glucose-Capillary: 74 mg/dL (ref 65–99)
Glucose-Capillary: 75 mg/dL (ref 65–99)
Glucose-Capillary: 19 mg/dL — CL (ref 65–99)
Glucose-Capillary: 79 mg/dL (ref 65–99)
Glucose-Capillary: 54 mg/dL — ABNORMAL LOW (ref 65–99)
Glucose-Capillary: 93 mg/dL (ref 65–99)
Glucose-Capillary: 95 mg/dL (ref 65–99)
Glucose-Capillary: 40 mg/dL — CL (ref 65–99)
Glucose-Capillary: 171 mg/dL — ABNORMAL HIGH (ref 65–99)
Glucose-Capillary: 174 mg/dL — ABNORMAL HIGH (ref 65–99)
Glucose-Capillary: 272 mg/dL — ABNORMAL HIGH (ref 65–99)

## 2014-10-09 LAB — URINALYSIS, ROUTINE W REFLEX MICROSCOPIC
BILIRUBIN URINE: NEGATIVE
GLUCOSE, UA: 500 mg/dL — AB
KETONES UR: NEGATIVE mg/dL
Leukocytes, UA: NEGATIVE
Nitrite: NEGATIVE
PH: 7.5 (ref 5.0–8.0)
PROTEIN: NEGATIVE mg/dL
Specific Gravity, Urine: 1.01 (ref 1.005–1.030)
Urobilinogen, UA: 1 mg/dL (ref 0.0–1.0)

## 2014-10-09 LAB — COMPREHENSIVE METABOLIC PANEL
ALT: 82 U/L — ABNORMAL HIGH (ref 17–63)
AST: 237 U/L — AB (ref 15–41)
Albumin: 3.6 g/dL (ref 3.5–5.0)
Alkaline Phosphatase: 169 U/L — ABNORMAL HIGH (ref 38–126)
Anion gap: 9 (ref 5–15)
BILIRUBIN TOTAL: 0.9 mg/dL (ref 0.3–1.2)
CO2: 32 mmol/L (ref 22–32)
Calcium: 8.7 mg/dL — ABNORMAL LOW (ref 8.9–10.3)
Chloride: 98 mmol/L — ABNORMAL LOW (ref 101–111)
Creatinine, Ser: 0.68 mg/dL (ref 0.61–1.24)
Glucose, Bld: <20 mg/dL — CL (ref 65–99)
POTASSIUM: 2.3 mmol/L — AB (ref 3.5–5.1)
Sodium: 139 mmol/L (ref 135–145)
TOTAL PROTEIN: 6.8 g/dL (ref 6.5–8.1)

## 2014-10-09 LAB — CBC WITH DIFFERENTIAL/PLATELET
Basophils Absolute: 0 10*3/uL (ref 0.0–0.1)
Basophils Relative: 0 %
Eosinophils Absolute: 0 10*3/uL (ref 0.0–0.7)
Eosinophils Relative: 0 %
HEMATOCRIT: 43.2 % (ref 39.0–52.0)
HEMOGLOBIN: 15.6 g/dL (ref 13.0–17.0)
LYMPHS ABS: 0.7 10*3/uL (ref 0.7–4.0)
Lymphocytes Relative: 7 %
MCH: 35.1 pg — AB (ref 26.0–34.0)
MCHC: 36.1 g/dL — AB (ref 30.0–36.0)
MCV: 97.3 fL (ref 78.0–100.0)
MONOS PCT: 8 %
Monocytes Absolute: 0.8 10*3/uL (ref 0.1–1.0)
NEUTROS ABS: 9.1 10*3/uL — AB (ref 1.7–7.7)
NEUTROS PCT: 85 %
Platelets: 105 10*3/uL — ABNORMAL LOW (ref 150–400)
RBC: 4.44 MIL/uL (ref 4.22–5.81)
RDW: 14 % (ref 11.5–15.5)
WBC: 10.7 10*3/uL — ABNORMAL HIGH (ref 4.0–10.5)

## 2014-10-09 LAB — CBC
HEMATOCRIT: 42 % (ref 39.0–52.0)
HEMOGLOBIN: 15.1 g/dL (ref 13.0–17.0)
MCH: 35 pg — ABNORMAL HIGH (ref 26.0–34.0)
MCHC: 36 g/dL (ref 30.0–36.0)
MCV: 97.4 fL (ref 78.0–100.0)
Platelets: 105 10*3/uL — ABNORMAL LOW (ref 150–400)
RBC: 4.31 MIL/uL (ref 4.22–5.81)
RDW: 14 % (ref 11.5–15.5)
WBC: 9.6 10*3/uL (ref 4.0–10.5)

## 2014-10-09 LAB — CBG MONITORING, ED
GLUCOSE-CAPILLARY: 105 mg/dL — AB (ref 65–99)
GLUCOSE-CAPILLARY: 70 mg/dL (ref 65–99)
Glucose-Capillary: 111 mg/dL — ABNORMAL HIGH (ref 65–99)
Glucose-Capillary: 241 mg/dL — ABNORMAL HIGH (ref 65–99)
Glucose-Capillary: 47 mg/dL — ABNORMAL LOW (ref 65–99)
Glucose-Capillary: 50 mg/dL — ABNORMAL LOW (ref 65–99)

## 2014-10-09 LAB — BASIC METABOLIC PANEL
ANION GAP: 10 (ref 5–15)
Anion gap: 10 (ref 5–15)
Anion gap: 8 (ref 5–15)
BUN: <5 mg/dL — ABNORMAL LOW (ref 6–20)
CALCIUM: 9.1 mg/dL (ref 8.9–10.3)
CALCIUM: 8.5 mg/dL — AB (ref 8.9–10.3)
CHLORIDE: 99 mmol/L — AB (ref 101–111)
CO2: 28 mmol/L (ref 22–32)
CO2: 32 mmol/L (ref 22–32)
CO2: 33 mmol/L — AB (ref 22–32)
Calcium: 8.5 mg/dL — ABNORMAL LOW (ref 8.9–10.3)
Chloride: 98 mmol/L — ABNORMAL LOW (ref 101–111)
Chloride: 97 mmol/L — ABNORMAL LOW (ref 101–111)
Creatinine, Ser: 0.59 mg/dL — ABNORMAL LOW (ref 0.61–1.24)
Creatinine, Ser: 0.56 mg/dL — ABNORMAL LOW (ref 0.61–1.24)
Creatinine, Ser: 0.63 mg/dL (ref 0.61–1.24)
GFR calc Af Amer: >60 mL/min (ref >60)
GFR calc Af Amer: >60 mL/min (ref >60)
GFR calc Af Amer: >60 mL/min (ref >60)
GLUCOSE: 26 mg/dL — AB (ref 65–99)
GLUCOSE: 70 mg/dL (ref 65–99)
Glucose, Bld: 24 mg/dL — CL (ref 65–99)
POTASSIUM: 2.7 mmol/L — AB (ref 3.5–5.1)
POTASSIUM: 3.5 mmol/L (ref 3.5–5.1)
Potassium: 2.6 mmol/L — CL (ref 3.5–5.1)
SODIUM: 137 mmol/L (ref 135–145)
Sodium: 140 mmol/L (ref 135–145)
Sodium: 138 mmol/L (ref 135–145)

## 2014-10-09 LAB — MAGNESIUM: MAGNESIUM: 1.6 mg/dL — AB (ref 1.7–2.4)

## 2014-10-09 LAB — URINE MICROSCOPIC-ADD ON

## 2014-10-09 LAB — RAPID URINE DRUG SCREEN, HOSP PERFORMED
Amphetamines: NOT DETECTED
BARBITURATES: NOT DETECTED
Benzodiazepines: POSITIVE — AB
COCAINE: NOT DETECTED
Opiates: NOT DETECTED
Tetrahydrocannabinol: NOT DETECTED

## 2014-10-09 LAB — MRSA PCR SCREENING: MRSA by PCR: NEGATIVE

## 2014-10-09 LAB — CK
CK TOTAL: 4667 U/L — AB (ref 49–397)
Total CK: 6495 U/L — ABNORMAL HIGH (ref 49–397)

## 2014-10-09 LAB — PHOSPHORUS: PHOSPHORUS: 2.2 mg/dL — AB (ref 2.5–4.6)

## 2014-10-09 MED ORDER — DEXTROSE 50 % IV SOLN
INTRAVENOUS | Status: AC
  Filled 2014-10-09: qty 50

## 2014-10-09 MED ORDER — MAGNESIUM SULFATE 2 GM/50ML IV SOLN
2.0000 g | Freq: Once | INTRAVENOUS | Status: AC
  Administered 2014-10-09: 2 g via INTRAVENOUS
  Filled 2014-10-09: qty 50

## 2014-10-09 MED ORDER — SODIUM CHLORIDE 4 MEQ/ML IV SOLN
INTRAVENOUS | Status: DC
  Administered 2014-10-09 – 2014-10-10 (×4): via INTRAVENOUS
  Filled 2014-10-09 (×6): qty 1000

## 2014-10-09 MED ORDER — POTASSIUM CHLORIDE 10 MEQ/100ML IV SOLN
10.0000 meq | Freq: Once | INTRAVENOUS | Status: AC
  Administered 2014-10-09: 10 meq via INTRAVENOUS

## 2014-10-09 MED ORDER — ENOXAPARIN SODIUM 40 MG/0.4ML ~~LOC~~ SOLN
40.0000 mg | SUBCUTANEOUS | Status: DC
  Administered 2014-10-09 – 2014-10-15 (×6): 40 mg via SUBCUTANEOUS
  Filled 2014-10-09 (×7): qty 0.4

## 2014-10-09 MED ORDER — DEXTROSE 50 % IV SOLN
1.0000 | INTRAVENOUS | Status: DC | PRN
  Administered 2014-10-09 (×10): 50 mL via INTRAVENOUS
  Filled 2014-10-09 (×6): qty 50

## 2014-10-09 MED ORDER — POTASSIUM CHLORIDE 10 MEQ/100ML IV SOLN
10.0000 meq | Freq: Once | INTRAVENOUS | Status: DC
  Filled 2014-10-09: qty 100

## 2014-10-09 MED ORDER — PNEUMOCOCCAL VAC POLYVALENT 25 MCG/0.5ML IJ INJ
0.5000 mL | INJECTION | INTRAMUSCULAR | Status: DC
  Filled 2014-10-09: qty 0.5

## 2014-10-09 MED ORDER — POTASSIUM CHLORIDE 10 MEQ/100ML IV SOLN
10.0000 meq | INTRAVENOUS | Status: AC
  Administered 2014-10-09 (×4): 10 meq via INTRAVENOUS
  Filled 2014-10-09 (×4): qty 100

## 2014-10-09 MED ORDER — DEXTROSE 50 % IV SOLN
1.0000 | Freq: Once | INTRAVENOUS | Status: AC
  Administered 2014-10-09: 50 mL via INTRAVENOUS

## 2014-10-09 MED ORDER — LORAZEPAM 2 MG/ML IJ SOLN
1.0000 mg | Freq: Once | INTRAMUSCULAR | Status: AC
  Administered 2014-10-09: 1 mg via INTRAVENOUS
  Filled 2014-10-09: qty 1

## 2014-10-09 MED ORDER — IBUPROFEN 200 MG PO TABS
400.0000 mg | ORAL_TABLET | Freq: Four times a day (QID) | ORAL | Status: DC | PRN
  Administered 2014-10-09: 400 mg via ORAL
  Filled 2014-10-09: qty 2

## 2014-10-09 MED ORDER — DEXTROSE 10 % IV SOLN: INTRAVENOUS | Status: DC

## 2014-10-09 MED ORDER — SODIUM CHLORIDE 0.9 % IJ SOLN
3.0000 mL | Freq: Two times a day (BID) | INTRAMUSCULAR | Status: DC
  Administered 2014-10-09 – 2014-10-15 (×9): 3 mL via INTRAVENOUS

## 2014-10-09 MED ORDER — LORAZEPAM 2 MG/ML IJ SOLN: 1.0000 mg | INTRAMUSCULAR | Status: DC | PRN

## 2014-10-09 MED ORDER — INFLUENZA VAC SPLIT QUAD 0.5 ML IM SUSY
0.5000 mL | PREFILLED_SYRINGE | INTRAMUSCULAR | Status: DC
  Filled 2014-10-09: qty 0.5

## 2014-10-09 MED ORDER — ACETAMINOPHEN 325 MG PO TABS: 650.0000 mg | ORAL_TABLET | Freq: Four times a day (QID) | ORAL | Status: DC | PRN

## 2014-10-09 MED ORDER — DEXTROSE 50 % IV SOLN
INTRAVENOUS | Status: AC
  Administered 2014-10-09: 50 mL
  Filled 2014-10-09: qty 50

## 2014-10-09 MED ORDER — ACETAMINOPHEN 650 MG RE SUPP: 650.0000 mg | Freq: Four times a day (QID) | RECTAL | Status: DC | PRN

## 2014-10-09 NOTE — Progress Notes (Signed)
Utilization review completed. Fadia Marlar, RN, BSN. 

## 2014-10-09 NOTE — Progress Notes (Signed)
CBG 50 Given 1 amp of D50 IV
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
## 2014-10-09 NOTE — Progress Notes (Signed)
Inpatient Diabetes Program Recommendations  AACE/ADA: New Consensus Statement on Inpatient Glycemic Control (2015)  Target Ranges:  Prepandial:   less than 140 mg/dL      Peak postprandial:   less than 180 mg/dL (1-2 hours)      Critically ill patients:  140 - 180 mg/dL   Results for IBAN, UTZ (MRN [id number]) as of 10/09/2014 08:02  Ref. Range 10/08/2014 22:57 10/08/2014 23:47 10/09/2014 00:01 10/09/2014 00:16 10/09/2014 00:36 10/09/2014 00:53 10/09/2014 01:10 10/09/2014 01:32 10/09/2014 02:02 10/09/2014 02:36 10/09/2014 03:18 10/09/2014 04:22 10/09/2014 04:39 10/09/2014 05:19  Glucose-Capillary Latest Ref Range: 65-99 mg/dL <10 (LL) 19 (LL) 241 (H) 70 105 (H) 50 (L) 111 (H) 47 (L) 71 75 74 19 (LL) 79 54 (L)     Admit with: Hypoglycemia and Seizures  History: DM, Chronic Pancreatitis, HTN  Home DM Meds: Lantus 40 units QHS       Novolog 8 units bidwc       Metformin 1000 mg bid  Current DM Orders: None    -Per MD notes, patient stated he took Lantus 65 units and Novolog 40 units prior to having seizures and Hypoglycemia.  Unsure why he took this amount as he was instructed to reduce his doses back in August when he was admitted with Hypoglycemia that time as well.  -Treated with D50% throughout the night and D10% drip started at 2am.  -Attempted to speak with patient about his home insulin regimen and review how to take Lantus and Novolog, how to recognize s/sxs of Hypoglycemia, how to treat Hypoglycemia, etc.  Patient was very sleepy and would not keep his eyes open during my attempted conversation with him.  In addition, patient attempted to get out of bed when he was instructed not to by the RN.  Still very confused.  -Not sure how well patient is caring for himself at home.  Would he benefit from a [facility] or [facility] nursing assistant to help him with his medications?  -Gave RN pamphlet on Hypoglycemia and sheet with instructions for how to take Lantus and Novolog.  Asked RN to please review  these educational pamphlets with patient when he is more awake.    Will follow Wyn Quaker RN, MSN, CDE Diabetes Coordinator Inpatient Glycemic Control Team Team Pager: [phone] (8a-5p)

## 2014-10-09 NOTE — Progress Notes (Signed)
Pt had a multiple episodes of Hypoglycemia through out the night. K+ was 2.7. Pt was getting 4 runs of K+. MD made aware of the situation.

## 2014-10-09 NOTE — Progress Notes (Signed)
CRITICAL VALUE ALERT  Critical value received:  Potassium (2.6), Serum Glucose (26)  Date of notification:  10/09/2014  Time of notification:  6599  Critical value read back:Yes.    Nurse who received alert:  Rico Sheehan RN  MD notified (1st page):  Dr Windell Hummingbird (internal medicine teaching service) Md was on unit  Time of first page:  1125  MD notified (2nd page):  Time of second page:  Responding MD: Dr Windell Hummingbird  Time MD responded:  1125

## 2014-10-09 NOTE — H&P (Signed)
Date: 10/09/2014               Patient Name:  Michael [name] MRN: [id number]  DOB: 07-20-1955 Age / Sex: 59 y.o., male   PCP: Juluis Mire, MD         Medical Service: Internal Medicine Teaching Service         Attending Physician: Dr. Annia Belt, MD    First Contact: Dr. Moises Blood Pager: [phone]  Second Contact: Dr. Naaman Plummer Pager: [phone]       After Hours (After 5p/  First Contact Pager: [phone]  weekends / holidays): Second Contact Pager: [phone]   Chief Complaint: Seizure  History of Present Illness: Michael [name] is a 59 yo male with history of DMII, chronic pancreatitis, and HTN, presenting by EMS after experiencing multiple episodes of tonic-clonic seizure.  During interview, patient extremely somnolent with poor attention.  Therefore, most of history obtained from chart.  Patient reports taking his insulin yesterday (9/15), taking Lantus 65 U and Novolog 40 U at approximately 230.  Patient reports he then ate two sandwiches, but has not eaten anything since then.  Per the chart, patient experienced 2 generalized tonic clonic seizures, and then experienced more seizures while being transported by EMS.  Patient received Versed 35m, glucagon, and D50.  On evaluation by EDP, his glucose was <10.  Though the patient would arouse with D50, he remained altered.    During interview, he denied fever, chills, chest pain, SOB, N/V, C/D, dysuria, or HA.  He denies muscle pains, bruising, or other pain.   Patient was recently admitted 8/16, after experiencing similar hypoglycemic seizure after taking Lantus 65 units at night Novolog 20 units in the morning and then skipping breakfast.  Upon discharge, his Glipizide was discontinued.  His insulin was decreased to Lantus 40 units qHS and Novolog 8 units qAC.    Meds: Current Facility-Administered Medications  Medication Dose Route Frequency Provider Last Rate Last Dose  . dextrose 10 % infusion   Intravenous Continuous DLeo Grosser MD  75 mL/hr at 10/08/14 2354    . dextrose 50 % solution           . dextrose 50 % solution           . potassium chloride 10 mEq in 100 mL IVPB  10 mEq Intravenous Once DLeo Grosser MD       Followed by  . potassium chloride 10 mEq in 100 mL IVPB  10 mEq Intravenous Once DLeo Grosser MD 100 mL/hr at 10/09/14 0032 10 mEq at 10/09/14 0032   Current Outpatient Prescriptions  Medication Sig Dispense Refill  . ARIPiprazole (ABILIFY) 15 MG tablet Take 15 mg by mouth at bedtime. Takes with 5 mg and 2 mg for a 22 mg dose    . ARIPiprazole (ABILIFY) 2 MG tablet Take 2 mg by mouth at bedtime. Takes with 15 mg and 5 mg for a 22 mg dose    . ARIPiprazole (ABILIFY) 5 MG tablet Take 5 mg by mouth at bedtime. Takes with 15 mg and 2 mg for a 22 mg dose    . aspirin EC 81 MG tablet Take 81 mg by mouth at bedtime.    . Blood Glucose Monitoring Suppl (ACCU-CHEK NANO SMARTVIEW) W/DEVICE KIT 1 each by Does not apply route 3 (three) times daily. Dx code 250.00 insulin requiring 1 kit 0  . FLUoxetine (PROZAC) 20 MG capsule Take 20 mg by mouth 2 (two) times daily.     .Marland Kitchen  glucose blood (ACCU-CHEK SMARTVIEW) test strip Check blood sugar 3 times a day before meals and bedtime Dx code E11.65 insulin requiring 100 each 6  . hydrochlorothiazide (HYDRODIURIL) 25 MG tablet Take 1 tablet (25 mg total) by mouth daily. 90 tablet 3  . insulin aspart (NOVOLOG) 100 UNIT/ML injection Inject 8 Units into the skin 2 (two) times daily. 10 mL 0  . Insulin Glargine (LANTUS SOLOSTAR) 100 UNIT/ML Solostar Pen Inject 0.40 mLs (40 Units total) into the skin at bedtime 30 mL 0  . Insulin Pen Needle (B-D UF III MINI PEN NEEDLES) 31G X 5 MM MISC USE TO INJECT insulin THREE TIMES DAILY 100 each 3  . lovastatin (MEVACOR) 40 MG tablet Take 1 tablet (40 mg total) by mouth at bedtime. 30 tablet 2  . meloxicam (MOBIC) 7.5 MG tablet Take 1 tablet (7.5 mg total) by mouth daily. 30 tablet 5  . metFORMIN (GLUCOPHAGE) 500 MG tablet Take 1,000 mg by mouth 2  (two) times daily with a meal.    . ondansetron (ZOFRAN) 4 MG tablet Take 1 tablet (4 mg total) by mouth every 6 (six) hours. 6 tablet 5  . Pancrelipase, Lip-Prot-Amyl, (CREON) 36000 UNITS CPEP Take 36,000 Units by mouth 3 (three) times daily.    . pantoprazole (PROTONIX) 40 MG tablet Take 1 tablet (40 mg total) by mouth daily. 90 tablet 0  . Vitamin D, Ergocalciferol, (DRISDOL) 50000 UNITS CAPS capsule Take 50,000 Units by mouth every 7 (seven) days. Every Sunday      Allergies: Allergies as of 10/08/2014 - Review Complete 10/08/2014  Allergen Reaction Noted  . Ace inhibitors Swelling 02/02/2011  . Losartan Swelling 02/03/2011   Past Medical History  Diagnosis Date  . Chronic pancreatitis   . Smoker   . Gastritis due to alcohol without hemorrhage   . HTN (hypertension)   . Pancreatic lesion     appears innocent and related to chronic pancreatitis  . Heavy alcohol use     hx of  . Marijuana use     hx of  . Migraine   . Depression   . Helicobacter pylori gastritis 03/27/2011    on endoscopy, treated with omeprazole, clarithromycin and amoxicillin  . Insomnia   . ACE inhibitor-aggravated angioedema   . Sessile colonic polyp   . Diabetes mellitus   . GERD (gastroesophageal reflux disease)   . Chronic abdominal pain on narcotics 06/27/2012  . Seizures    Past Surgical History  Procedure Laterality Date  . Esophagogastroduodenoscopy  03/22/2011    Procedure: ESOPHAGOGASTRODUODENOSCOPY (EGD);  Surgeon: Gatha Mayer, MD;  Location: [facility];  Service: Endoscopy;  Laterality: N/A;  egd with balloon   . Balloon dilation  03/22/2011    Procedure: BALLOON DILATION;  Surgeon: Gatha Mayer, MD;  Location: [facility];  Service: Endoscopy;  Laterality: N/A;  . Eus  04/20/2011    Procedure: UPPER ENDOSCOPIC ULTRASOUND (EUS) LINEAR;  Surgeon: Milus Banister, MD;  Location: [facility];  Service: Endoscopy;  Laterality: N/A;  . Colonoscopy w/ biopsies and polypectomy  09/12/11  .  Colonoscopy N/A 05/22/2012    Procedure: COLONOSCOPY;  Surgeon: Gatha Mayer, MD;  Location: [facility];  Service: Endoscopy;  Laterality: N/A;   Family History  Problem Relation Age of Onset  . Colon cancer Neg Hx   . Stomach cancer Neg Hx   . Anesthesia problems Neg Hx   . Hypotension Neg Hx   . Pseudochol deficiency Neg Hx   . Malignant  hyperthermia Neg Hx   . Hypertension Sister   . Diabetes Sister   . Heart disease Sister    Social History   Social History  . Marital Status: Single    Spouse Name: N/A  . Number of Children: 0  . Years of Education: 11   Occupational History  .    [employer]    worked for 6 years  . 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 Smoking status: Current Every Day Smoker -- 0.60 packs/day for 30 years    Types: Cigarettes  . Smokeless tobacco: Never Used  . Alcohol Use: 9.6 oz/week    6 Cans of beer, 10 Standard drinks or equivalent per week  . Drug Use: Yes    Special: Marijuana     Comment: 09/08/2014 "maybe once/month"  . Sexual Activity: Yes   Other Topics Concern  . Not on file   Social History Narrative   Has been living at 3 different friend's homes since discharge 01/2011. Was living in the shelter prior to admission. Had previously lived with his niece and other family members but he has gotten kicked out each time.     Review of Systems: Pertinent items are noted in HPI.  Physical Exam: Blood pressure 149/76, pulse 92, temperature 97.5 F (36.4 C), temperature source Axillary, resp. rate 15, SpO2 96 %. Physical Exam  Constitutional: He is well-developed, well-nourished, and in no distress. No distress.  HENT:  Head: Normocephalic and atraumatic.  No evidence of tongue biting.  Eyes: EOM are normal. Pupils are equal, round, and reactive to light. No scleral icterus.  Right lateral scleral hemorrhage.  Neck: Normal range of motion. No tracheal deviation  present.  Cardiovascular: Normal rate, regular rhythm, normal heart sounds and intact distal pulses.   Pulmonary/Chest: Effort normal and breath sounds normal. No respiratory distress. He has no wheezes.  Abdominal: Soft. Bowel sounds are normal. He exhibits no distension. There is no tenderness. There is no rebound and no guarding.  Musculoskeletal: Normal range of motion. He exhibits no edema.  Neurological: He is alert.  Somnolent. Oriented to person and place, not oriented to time.  Neurologic testing limited by patient participation. CNII-XII intact to bedside testing.  Biceps and Patellar reflexes 2+ and symmetric.   Right: 4+/5 strength in UE and LE.  No dysmetria. Babinski negative. Left: 4+/5 strength in UE and LE.  No dysmetria. Babinski positive.    Pronator drift of LUE.  Skin: Skin is warm and dry. He is not diaphoretic.     Lab results: Basic Metabolic Panel:  Recent Labs  10/08/14 2310  NA 139  K 2.3*  CL 98*  CO2 32  GLUCOSE <20*  BUN <5*  CREATININE 0.68  CALCIUM 8.7*   Liver Function Tests:  Recent Labs  10/08/14 2310  AST 237*  ALT 82*  ALKPHOS 169*  BILITOT 0.9  PROT 6.8  ALBUMIN 3.6   No results for input(s): LIPASE, AMYLASE in the last 72 hours. No results for input(s): AMMONIA in the last 72 hours. CBC:  Recent Labs  10/08/14 2310  WBC 10.7*  NEUTROABS 9.1*  HGB 15.6  HCT 43.2  MCV 97.3  PLT 105*   Cardiac Enzymes:  Recent Labs  10/08/14 2310  CKTOTAL 4667*   BNP: No results for input(s): PROBNP in the last 72 hours. D-Dimer: No results for input(s): DDIMER in the  last 72 hours. CBG:  Recent Labs  10/08/14 2347 10/09/14 0001 10/09/14 0016 10/09/14 0036 10/09/14 0053 10/09/14 0110  GLUCAP 19* 241* 70 105* 50* 111*   Hemoglobin A1C: No results for input(s): HGBA1C in the last 72 hours. Fasting Lipid Panel: No results for input(s): CHOL, HDL, LDLCALC, TRIG, CHOLHDL, LDLDIRECT in the last 72 hours. Thyroid  Function Tests: No results for input(s): TSH, T4TOTAL, FREET4, T3FREE, THYROIDAB in the last 72 hours. Anemia Panel: No results for input(s): VITAMINB12, FOLATE, FERRITIN, TIBC, IRON, RETICCTPCT in the last 72 hours. Coagulation: No results for input(s): LABPROT, INR in the last 72 hours. Urine Drug Screen: Drugs of Abuse     Component Value Date/Time   LABOPIA NONE DETECTED 03/04/2014 0045   COCAINSCRNUR NONE DETECTED 03/04/2014 0045   COCAINSCRNUR NEG 07/05/2012 0946   LABBENZ NONE DETECTED 03/04/2014 0045   LABBENZ NEG 07/05/2012 0946   AMPHETMU NONE DETECTED 03/04/2014 0045   AMPHETMU NEG 07/05/2012 0946   THCU POSITIVE* 03/04/2014 0045   LABBARB NONE DETECTED 03/04/2014 0045    Alcohol Level:  Recent Labs  10/08/14 [employer] <5   Urinalysis: No results for input(s): COLORURINE, LABSPEC, PHURINE, GLUCOSEU, HGBUR, BILIRUBINUR, KETONESUR, PROTEINUR, UROBILINOGEN, NITRITE, LEUKOCYTESUR in the last 72 hours.  Invalid input(s): APPERANCEUR   Imaging results:  No results found.  Other results: EKG:   Assessment & Plan by Problem: Active Problems:   Seizure  Mr. Segreto is a 59 yo male with history of DMII, chronic pancreatitis, and HTN, presenting with recurrent hypoglycemic seizure.    Seizures 2/2 Hypoglycemia: Patient presented with intractable seizures secondary to hypoglycemia from insulin overdose. Patient was given Versed 5 mg by EMS with resolution of seizures. CBG in ED was < 10. Patient was given multiple amps of D50 with persistent hypoglycemia which would improve and then later recur. Patient was started on D10 gtt. Patient has a previous history of this and was discharged in August on Novolog 8 units BID and Lantus 40 units QD. Patient is altered, but states he took Novolog 40 units and Lantus 65 units yesterday.  -D10 125 cc/hr -D50 amp prn hypoglycemia -CBG Q1H -Ativan 1-2 mg IV prn seizure -Repeat CBC/BMET tomorrow am -NPO -EKG tomorrow am -Consult to  diabetes coordinator -Bed rest -I/Os -Seizure precautions -UA/UDS -Trend lactic acid  Acute Encephalopathy: Likely secondary to seizure and possible hypoglycemic brain injury. Patient is oriented to self and place, but confused on examination.  -CT Head -Neuro checks  Acute on Chronic Hypokalemia: K 2.3 on admission. Likely 2/2 insulin overdose. Received KCl 20 mEq in the ED.  -KCl 10 mEq x 4 -Repeat BMET in am   Bipolar Disorder: Patient is on Abilify 22 units QHS and Fluoxetine 20 mg daily at home.  -Holding while NPO  DMII: Last HgbA1c was 7.3 in May 2016. Patient is supposed to be on Novolog 8 units BID and Lantus 40 units QD and Metformin 1000 mg BID. Patient appears unsafe to manage his own insulin at home. May need closer follow up. -Holding all diabetic medications in setting of hypoglycemia -HgbA1c tomorrow am  Chronic Pancreatitis: Patient has a history of alcohol abuse and pain medication abuse. He is on Creon at home.  -Holding Creon and pain medication in the setting of seizure and hypoglycemia -UDS  HTN: 141/74 on admission. Patient is on HCTZ 25 mg daily at home. -Holding while NPO  HLD: Patient is on Lovastatin 40 mg QHS at home. -Holding while NPO  GERD:  Patient is on protonix 40 mg daily at home. -Holding while NPO.   DVT/PE ppx: Lovenox SQ QD  FEN: NPO, D10 gtt  Dispo: Disposition is deferred at this time, awaiting improvement of current medical problems.   The patient does have a current PCP (Juluis Mire, MD) and does need an [facility] hospital follow-up appointment after discharge.  The patient does not have transportation limitations that hinder transportation to clinic appointments.  Signed: Iline Oven, MD, PhD 10/09/2014, 1:30 AM

## 2014-10-09 NOTE — Progress Notes (Signed)
   10/09/14 1415  What Happened  Was fall witnessed? No  Was patient injured? Unsure  Patient found on floor  Found by Staff-comment (by RN)  Stated prior activity other (comment)  Follow Up  MD notified Dr Windell Hummingbird  Time MD notified [phone]  Family notified Yes-comment  Time family notified 1420  Additional tests Yes-comment (xray pt c/o lt buttocks, shoulder and left neck pain)  Simple treatment Other (comment)  Progress note created (see row info) Yes  Adult Fall Risk Assessment  Risk Factor Category (scoring not indicated) High fall risk per protocol (document High fall risk)  Patient's Fall Risk High Fall Risk (>13 points)  Adult Fall Risk Interventions  Required Bundle Interventions *See Row Information* High fall risk - low, moderate, and high requirements implemented  Additional Interventions Fall risk signage  Fall with Injury Screening  Risk For Fall Injury- See Row Information  Nurse judgement  Vitals  Pulse Rate (!) 124  ECG Heart Rate (!) 123  Resp 14  Oxygen Therapy  SpO2 98 %

## 2014-10-09 NOTE — Progress Notes (Signed)
10/09/2014 Patient blood sugar at 0757 was 28 he was given a amp of D50 and at 0826 increase to 86. At 0900 blood sugar was 54 given amp of D50 and at 0923 it was 93. 0959 blood sugar was 52 given amp D50  Blood sugar was recheck it was 120. 1203 it was 50 given amp of D50 at 1250 reassess it was 95. 1315 blood sugar was 74. Check at 1407 it was 40 and amp D50 was given  At it was recheck at 1514 it was 74. 1613 blood sugar was 56 at at 1700 he was 40 he was given amp D50 and at 1755 blood assess it was 171. Internal medicine was notified today about all the abnormal blood sugars. Rico Sheehan RN

## 2014-10-10 DIAGNOSIS — F10231 Alcohol dependence with withdrawal delirium: Secondary | ICD-10-CM

## 2014-10-10 DIAGNOSIS — F319 Bipolar disorder, unspecified: Secondary | ICD-10-CM

## 2014-10-10 LAB — BASIC METABOLIC PANEL
ANION GAP: 8 (ref 5–15)
ANION GAP: 9 (ref 5–15)
Anion gap: 8 (ref 5–15)
BUN: <5 mg/dL — ABNORMAL LOW (ref 6–20)
BUN: 5 mg/dL — ABNORMAL LOW (ref 6–20)
CALCIUM: 8.4 mg/dL — AB (ref 8.9–10.3)
CALCIUM: 8.4 mg/dL — AB (ref 8.9–10.3)
CHLORIDE: 102 mmol/L (ref 101–111)
CO2: 25 mmol/L (ref 22–32)
CO2: 25 mmol/L (ref 22–32)
CO2: 22 mmol/L (ref 22–32)
CREATININE: 0.55 mg/dL — AB (ref 0.61–1.24)
CREATININE: 0.67 mg/dL (ref 0.61–1.24)
Calcium: 7.8 mg/dL — ABNORMAL LOW (ref 8.9–10.3)
Chloride: 107 mmol/L (ref 101–111)
Chloride: 106 mmol/L (ref 101–111)
Creatinine, Ser: 0.75 mg/dL (ref 0.61–1.24)
GFR calc Af Amer: >60 mL/min (ref >60)
GLUCOSE: 29 mg/dL — AB (ref 65–99)
Glucose, Bld: 128 mg/dL — ABNORMAL HIGH (ref 65–99)
Glucose, Bld: 312 mg/dL — ABNORMAL HIGH (ref 65–99)
POTASSIUM: 4.8 mmol/L (ref 3.5–5.1)
Potassium: 2.7 mmol/L — CL (ref 3.5–5.1)
Potassium: 3.5 mmol/L (ref 3.5–5.1)
SODIUM: 139 mmol/L (ref 135–145)
SODIUM: 133 mmol/L — AB (ref 135–145)
Sodium: 140 mmol/L (ref 135–145)

## 2014-10-10 LAB — C-PEPTIDE: C-Peptide: 0.1 ng/mL — ABNORMAL LOW (ref 1.1–4.4)

## 2014-10-10 LAB — CBC
HCT: 40.4 % (ref 39.0–52.0)
Hemoglobin: 14.3 g/dL (ref 13.0–17.0)
MCH: 34.3 pg — ABNORMAL HIGH (ref 26.0–34.0)
MCHC: 35.4 g/dL (ref 30.0–36.0)
MCV: 96.9 fL (ref 78.0–100.0)
PLATELETS: 92 10*3/uL — AB (ref 150–400)
RBC: 4.17 MIL/uL — ABNORMAL LOW (ref 4.22–5.81)
RDW: 13.6 % (ref 11.5–15.5)
WBC: 9 10*3/uL (ref 4.0–10.5)

## 2014-10-10 LAB — GLUCOSE, CAPILLARY
GLUCOSE-CAPILLARY: 101 mg/dL — AB (ref 65–99)
GLUCOSE-CAPILLARY: 149 mg/dL — AB (ref 65–99)
GLUCOSE-CAPILLARY: 262 mg/dL — AB (ref 65–99)
GLUCOSE-CAPILLARY: 266 mg/dL — AB (ref 65–99)
GLUCOSE-CAPILLARY: 29 mg/dL — AB (ref 65–99)
GLUCOSE-CAPILLARY: 369 mg/dL — AB (ref 65–99)
GLUCOSE-CAPILLARY: 60 mg/dL — AB (ref 65–99)
Glucose-Capillary: 87 mg/dL (ref 65–99)

## 2014-10-10 LAB — HIV ANTIBODY (ROUTINE TESTING W REFLEX): HIV SCREEN 4TH GENERATION: NONREACTIVE

## 2014-10-10 LAB — HEPATITIS PANEL, ACUTE
HEP A IGM: NEGATIVE
HEP B C IGM: POSITIVE — AB
Hepatitis B Surface Ag: NEGATIVE

## 2014-10-10 LAB — MAGNESIUM: MAGNESIUM: 1.7 mg/dL (ref 1.7–2.4)

## 2014-10-10 LAB — CK: Total CK: 3355 U/L — ABNORMAL HIGH (ref 49–397)

## 2014-10-10 LAB — INSULIN, RANDOM: INSULIN: 4.9 u[IU]/mL (ref 2.6–24.9)

## 2014-10-10 LAB — HEMOGLOBIN A1C
HEMOGLOBIN A1C: 6.8 % — AB (ref 4.8–5.6)
MEAN PLASMA GLUCOSE: 148 mg/dL

## 2014-10-10 MED ORDER — DIAZEPAM 5 MG/ML IJ SOLN
5.0000 mg | INTRAMUSCULAR | Status: DC | PRN
  Administered 2014-10-10 (×3): 5 mg via INTRAVENOUS
  Administered 2014-10-11 (×2): 10 mg via INTRAVENOUS
  Administered 2014-10-11: 5 mg via INTRAVENOUS
  Administered 2014-10-11: 10 mg via INTRAVENOUS
  Filled 2014-10-10 (×8): qty 2

## 2014-10-10 MED ORDER — DIAZEPAM 5 MG/ML IJ SOLN
5.0000 mg | Freq: Two times a day (BID) | INTRAMUSCULAR | Status: DC
  Administered 2014-10-10 – 2014-10-11 (×2): 5 mg via INTRAVENOUS
  Filled 2014-10-10 (×2): qty 2

## 2014-10-10 MED ORDER — POTASSIUM CHLORIDE 10 MEQ/100ML IV SOLN
10.0000 meq | INTRAVENOUS | Status: DC
  Administered 2014-10-10 (×2): 10 meq via INTRAVENOUS
  Filled 2014-10-10 (×2): qty 100

## 2014-10-10 MED ORDER — MAGNESIUM SULFATE 2 GM/50ML IV SOLN
2.0000 g | Freq: Once | INTRAVENOUS | Status: AC
  Administered 2014-10-10: 2 g via INTRAVENOUS
  Filled 2014-10-10: qty 50

## 2014-10-10 MED ORDER — LORAZEPAM 2 MG/ML IJ SOLN
INTRAMUSCULAR | Status: AC
  Administered 2014-10-10: 2 mg
  Filled 2014-10-10: qty 1

## 2014-10-10 MED ORDER — INFLUENZA VAC SPLIT QUAD 0.5 ML IM SUSY
0.5000 mL | PREFILLED_SYRINGE | INTRAMUSCULAR | Status: DC
  Filled 2014-10-10: qty 0.5

## 2014-10-10 MED ORDER — POTASSIUM CHLORIDE CRYS ER 20 MEQ PO TBCR
40.0000 meq | EXTENDED_RELEASE_TABLET | Freq: Two times a day (BID) | ORAL | Status: DC
  Administered 2014-10-10 – 2014-10-11 (×3): 40 meq via ORAL
  Filled 2014-10-10 (×3): qty 2

## 2014-10-10 MED ORDER — PANTOPRAZOLE SODIUM 40 MG PO TBEC
40.0000 mg | DELAYED_RELEASE_TABLET | Freq: Every day | ORAL | Status: DC
  Administered 2014-10-10 – 2014-10-15 (×5): 40 mg via ORAL
  Filled 2014-10-10 (×5): qty 1

## 2014-10-10 MED ORDER — LORAZEPAM 2 MG/ML IJ SOLN: 2.0000 mg | Freq: Once | INTRAMUSCULAR | Status: AC

## 2014-10-10 MED ORDER — HALOPERIDOL LACTATE 5 MG/ML IJ SOLN
1.0000 mg | Freq: Once | INTRAMUSCULAR | Status: AC
  Administered 2014-10-10: 1 mg via INTRAMUSCULAR
  Filled 2014-10-10: qty 1

## 2014-10-10 MED ORDER — MAGNESIUM SULFATE 50 % IJ SOLN
2.0000 g | Freq: Once | INTRAMUSCULAR | Status: DC
  Filled 2014-10-10: qty 4

## 2014-10-10 MED ORDER — PNEUMOCOCCAL VAC POLYVALENT 25 MCG/0.5ML IJ INJ
0.5000 mL | INJECTION | INTRAMUSCULAR | Status: DC
  Filled 2014-10-10: qty 0.5

## 2014-10-10 NOTE — Progress Notes (Signed)
CRITICAL VALUE ALERT  Critical value received:  Potassium 2.7  Date of notification:  10/10/2014  Time of notification:  0500  Critical value read back:Yes.    Nurse who received alert:  Brien Mates RN  MD notified (1st page):  Dr Liberty Handy  Time of first page:  0501  MD notified (2nd page):  Time of second page:  Responding MD:  Dr Liberty Handy  Time MD responded:  [phone]

## 2014-10-10 NOTE — Progress Notes (Signed)
The patient became increasingly combative and verbally abusive this morning.  He was attempting to pull out his IV and removing his telemetry leads despite the use of wrist restraints.  Dr. Marijean Bravo was called and ordered 2 mg ativan IV to address the patients agitation.  Ativan was administered as ordered.  Will continue to monitor.

## 2014-10-10 NOTE — Progress Notes (Signed)
Called by nursing staff as patient is increased in aggressiveness and endangering himself as he requires telemetry, IV access at this time. He is still acutely delirious at this time secondary to alcohol withdrawal, hypoglycemia, and electrolye derangement.
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
## 2014-10-11 LAB — CBC
HCT: 41.6 % (ref 39.0–52.0)
Hemoglobin: 14.6 g/dL (ref 13.0–17.0)
MCH: 34.6 pg — AB (ref 26.0–34.0)
MCHC: 35.1 g/dL (ref 30.0–36.0)
MCV: 98.6 fL (ref 78.0–100.0)
PLATELETS: 114 10*3/uL — AB (ref 150–400)
RBC: 4.22 MIL/uL (ref 4.22–5.81)
RDW: 13.6 % (ref 11.5–15.5)
WBC: 8.3 10*3/uL (ref 4.0–10.5)

## 2014-10-11 LAB — GLUCOSE, CAPILLARY
GLUCOSE-CAPILLARY: 155 mg/dL — AB (ref 65–99)
GLUCOSE-CAPILLARY: 169 mg/dL — AB (ref 65–99)
GLUCOSE-CAPILLARY: 199 mg/dL — AB (ref 65–99)
GLUCOSE-CAPILLARY: 257 mg/dL — AB (ref 65–99)
Glucose-Capillary: 146 mg/dL — ABNORMAL HIGH (ref 65–99)
Glucose-Capillary: 143 mg/dL — ABNORMAL HIGH (ref 65–99)
Glucose-Capillary: 199 mg/dL — ABNORMAL HIGH (ref 65–99)

## 2014-10-11 LAB — BASIC METABOLIC PANEL
Anion gap: 8 (ref 5–15)
BUN: 5 mg/dL — ABNORMAL LOW (ref 6–20)
CALCIUM: 8.7 mg/dL — AB (ref 8.9–10.3)
CO2: 27 mmol/L (ref 22–32)
CREATININE: 0.56 mg/dL — AB (ref 0.61–1.24)
Chloride: 106 mmol/L (ref 101–111)
Glucose, Bld: 156 mg/dL — ABNORMAL HIGH (ref 65–99)
Potassium: 4.2 mmol/L (ref 3.5–5.1)
Sodium: 141 mmol/L (ref 135–145)

## 2014-10-11 LAB — CK: CK TOTAL: 1561 U/L — AB (ref 49–397)

## 2014-10-11 LAB — MAGNESIUM: MAGNESIUM: 1.6 mg/dL — AB (ref 1.7–2.4)

## 2014-10-11 MED ORDER — FLUOXETINE HCL 20 MG PO CAPS: 20.0000 mg | ORAL_CAPSULE | Freq: Two times a day (BID) | ORAL | Status: DC

## 2014-10-11 MED ORDER — DEXMEDETOMIDINE HCL IN NACL 200 MCG/50ML IV SOLN
0.4000 ug/kg/h | INTRAVENOUS | Status: DC
Start: 2014-10-11 — End: 2014-10-12
  Administered 2014-10-11: 0.4 ug/kg/h via INTRAVENOUS
  Administered 2014-10-11: 1.2 ug/kg/h via INTRAVENOUS
  Administered 2014-10-11: 1 ug/kg/h via INTRAVENOUS
  Administered 2014-10-12: 0.5 ug/kg/h via INTRAVENOUS
  Filled 2014-10-11 (×3): qty 50
  Filled 2014-10-11: qty 100

## 2014-10-11 MED ORDER — ARIPIPRAZOLE 2 MG PO TABS
2.0000 mg | ORAL_TABLET | Freq: Every day | ORAL | Status: DC
  Filled 2014-10-11: qty 1

## 2014-10-11 MED ORDER — MAGNESIUM OXIDE 400 (241.3 MG) MG PO TABS
400.0000 mg | ORAL_TABLET | Freq: Every day | ORAL | Status: DC
  Administered 2014-10-12: 400 mg via ORAL
  Filled 2014-10-11 (×3): qty 1

## 2014-10-11 MED ORDER — ARIPIPRAZOLE 5 MG PO TABS
5.0000 mg | ORAL_TABLET | Freq: Every day | ORAL | Status: DC
  Filled 2014-10-11: qty 1

## 2014-10-11 MED ORDER — ARIPIPRAZOLE 15 MG PO TABS
15.0000 mg | ORAL_TABLET | Freq: Every day | ORAL | Status: DC
  Filled 2014-10-11: qty 1

## 2014-10-11 MED ORDER — ARIPIPRAZOLE 2 MG PO TABS
22.0000 mg | ORAL_TABLET | Freq: Every day | ORAL | Status: DC
  Filled 2014-10-11: qty 1

## 2014-10-11 NOTE — Progress Notes (Signed)
eLink Physician-Brief Progress Note Patient Name: Michael [name] DOB: 10/05/1955 MRN: [id number]   Date of Service  10/11/2014  HPI/Events of Note  Agitation / ethoh w/d refractory to valium 10 mg IV  eICU Interventions  Start precedex drip      Intervention Category Major Interventions: Delirium, psychosis, severe agitation - evaluation and management  Christinia Gully 10/11/2014, 6:43 PM

## 2014-10-11 NOTE — Progress Notes (Signed)
Patient's CBG 256 at 1700. Sitter in room notified RN that patient had been given 4 orange juices. Patient educated on dietary choices. Will review next CBG as patient has had a history of significant hypoglycemia this admission.   Will re-evaluate at 8pm rounds and continue to monitor.  SHYSHKO, ALEXANDRA R

## 2014-10-11 NOTE — Progress Notes (Signed)
Patient became extremely agitated and combative with staff. Patient refusing to go back to bed from his chair. He is adamant that he is going home to see his mother. Security was called as safety of staff was being compromised. Patient was placed back in bed and restraints were applied. MD was notified and precedex was ordered. 10mg  of valium per CIWA protocol was also administered. Patient's uncle "Jow" was here earlier and was notified of incident.   Patient continues to be aggressive both physically and verbally towards staff. Will continue to monitor patient and make changes to medications as needed.   SHYSHKO, ALEXANDRA R

## 2014-10-11 NOTE — Progress Notes (Addendum)
Error in entry.
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
## 2014-10-12 ENCOUNTER — Inpatient Hospital Stay (HOSPITAL_COMMUNITY): Payer: Medicaid Other

## 2014-10-12 DIAGNOSIS — Z789 Other specified health status: Secondary | ICD-10-CM | POA: Insufficient documentation

## 2014-10-12 DIAGNOSIS — W19XXXA Unspecified fall, initial encounter: Secondary | ICD-10-CM

## 2014-10-12 DIAGNOSIS — E162 Hypoglycemia, unspecified: Secondary | ICD-10-CM

## 2014-10-12 DIAGNOSIS — Z7289 Other problems related to lifestyle: Secondary | ICD-10-CM | POA: Insufficient documentation

## 2014-10-12 LAB — GLUCOSE, CAPILLARY
GLUCOSE-CAPILLARY: 174 mg/dL — AB (ref 65–99)
GLUCOSE-CAPILLARY: 159 mg/dL — AB (ref 65–99)
Glucose-Capillary: 169 mg/dL — ABNORMAL HIGH (ref 65–99)
Glucose-Capillary: 158 mg/dL — ABNORMAL HIGH (ref 65–99)
Glucose-Capillary: 146 mg/dL — ABNORMAL HIGH (ref 65–99)
Glucose-Capillary: 192 mg/dL — ABNORMAL HIGH (ref 65–99)

## 2014-10-12 LAB — HEPATITIS B SURFACE ANTIGEN: Hepatitis B Surface Ag: NEGATIVE

## 2014-10-12 LAB — POCT I-STAT 3, ART BLOOD GAS (G3+)
Acid-Base Excess: 3 mmol/L — ABNORMAL HIGH (ref 0.0–2.0)
BICARBONATE: 28.8 meq/L — AB (ref 20.0–24.0)
O2 Saturation: 100 %
PCO2 ART: 47.3 mmHg — AB (ref 35.0–45.0)
Patient temperature: 97.4
TCO2: 30 mmol/L (ref 0–100)
pH, Arterial: 7.389 (ref 7.350–7.450)
pO2, Arterial: 178 mmHg — ABNORMAL HIGH (ref 80.0–100.0)

## 2014-10-12 LAB — HEPATITIS B CORE ANTIBODY, IGM: HEP B C IGM: POSITIVE — AB

## 2014-10-12 LAB — VITAMIN D 25 HYDROXY (VIT D DEFICIENCY, FRACTURES): VIT D 25 HYDROXY: 7.8 ng/mL — AB (ref 30.0–100.0)

## 2014-10-12 MED ORDER — FLUOXETINE HCL 20 MG PO CAPS
20.0000 mg | ORAL_CAPSULE | Freq: Two times a day (BID) | ORAL | Status: DC
  Administered 2014-10-12 – 2014-10-15 (×7): 20 mg via ORAL
  Filled 2014-10-12 (×8): qty 1

## 2014-10-12 MED ORDER — FENTANYL CITRATE (PF) 100 MCG/2ML IJ SOLN: 25.0000 ug | INTRAMUSCULAR | Status: DC | PRN

## 2014-10-12 MED ORDER — ARIPIPRAZOLE 10 MG PO TABS
15.0000 mg | ORAL_TABLET | Freq: Every day | ORAL | Status: DC
  Administered 2014-10-12 – 2014-10-14 (×3): 15 mg via ORAL
  Filled 2014-10-12: qty 1
  Filled 2014-10-12: qty 2
  Filled 2014-10-12 (×2): qty 1

## 2014-10-12 MED ORDER — DEXMEDETOMIDINE HCL IN NACL 400 MCG/100ML IV SOLN
0.2000 ug/kg/h | INTRAVENOUS | Status: DC
  Administered 2014-10-12: 0.7 ug/kg/h via INTRAVENOUS
  Administered 2014-10-12 – 2014-10-13 (×2): 0.6 ug/kg/h via INTRAVENOUS
  Filled 2014-10-12 (×5): qty 100

## 2014-10-12 MED ORDER — HALOPERIDOL LACTATE 5 MG/ML IJ SOLN: 5.0000 mg | Freq: Once | INTRAMUSCULAR | Status: AC

## 2014-10-12 MED ORDER — HALOPERIDOL LACTATE 5 MG/ML IJ SOLN
INTRAMUSCULAR | Status: AC
  Administered 2014-10-12: 5 mg
  Filled 2014-10-12: qty 1

## 2014-10-12 MED ORDER — LORAZEPAM 2 MG/ML IJ SOLN
1.0000 mg | Freq: Four times a day (QID) | INTRAMUSCULAR | Status: DC
  Administered 2014-10-12 – 2014-10-15 (×10): 1 mg via INTRAVENOUS
  Filled 2014-10-12 (×10): qty 1

## 2014-10-12 MED ORDER — MIDAZOLAM HCL 2 MG/2ML IJ SOLN: 1.0000 mg | INTRAMUSCULAR | Status: DC | PRN

## 2014-10-12 MED ORDER — DEXMEDETOMIDINE HCL IN NACL 200 MCG/50ML IV SOLN
0.2000 ug/kg/h | INTRAVENOUS | Status: DC
  Administered 2014-10-12: 0.4 ug/kg/h via INTRAVENOUS

## 2014-10-12 MED ORDER — SODIUM CHLORIDE 0.9 % IV BOLUS (SEPSIS)
500.0000 mL | Freq: Once | INTRAVENOUS | Status: AC
  Administered 2014-10-12: 500 mL via INTRAVENOUS

## 2014-10-12 MED ORDER — THIAMINE HCL 100 MG/ML IJ SOLN
Freq: Once | INTRAVENOUS | Status: AC
  Administered 2014-10-12: 11:00:00 via INTRAVENOUS
  Filled 2014-10-12: qty 1000

## 2014-10-12 NOTE — Consult Note (Signed)
   Name: AUGUSTIN BUN MRN: [id number] DOB: 08-04-1955    ADMISSION DATE:  10/08/2014 CONSULTATION DATE:  9/18  REFERRING MD :  IMTS   CHIEF COMPLAINT:  ETOH withdrawal   BRIEF PATIENT DESCRIPTION:  59yo male with hx DM, ETOH abuse, bipolar disorder, chronic pancreatitis, HTN admitted 9/15 with hypoglycemic seizure.  Was improving on med-surg floor, being treated with IV dextrose but began having increased agitation likely r/t ETOH withdrawal.  PCCM consulted for tx ICU and ?precedex.   SIGNIFICANT EVENTS  9/18- required precedex  STUDIES:  CT head 9/16>>> neg acute   SUBJECTIVE: sedated  VITAL SIGNS: Temp:  [97.2 F (36.2 C)-98.8 F (37.1 C)] 97.4 F (36.3 C) (09/19 0341) Pulse Rate:  [58-116] 58 (09/19 0500) Resp:  [11-28] 12 (09/19 0500) BP: (80-158)/(65-107) 149/86 mmHg (09/19 0500) SpO2:  [90 %-100 %] 100 % (09/19 0500)  PHYSICAL EXAMINATION: General:  Pleasant, chronically ill appearing male, NAD Neuro:  Lethjargic, perrl, will awaken and talk and hit RN HEENT:  Mm moist, no JVD  Cardiovascular:  s1s2 rrr , 63 Lungs:  CTA Abdomen:  Soft, non tender, +bs  Musculoskeletal:  Warm and dry, no edema    Recent Labs Lab 10/10/14 1328 10/10/14 2049 10/11/14 1143  NA 139 133* 141  K 3.5 4.8 4.2  CL 106 102 106  CO2 25 22 27   BUN <5* 5* 5*  CREATININE 0.67 0.75 0.56*  GLUCOSE 128* 312* 156*    Recent Labs Lab 10/09/14 0415 10/10/14 0336 10/11/14 1143  HGB 15.1 14.3 14.6  HCT 42.0 40.4 41.6  WBC 9.6 9.0 8.3  PLT 105* 92* 114*   No results found.  ASSESSMENT / PLAN:  Agitation/ ETOH withdrawal  Hx bipolar disorder  PLAN -   precedex required Thimaine, folic, MVI BNo use CIWA in ICU, dc Resume home prozac  Prn versed, fent consideration Valium ineffective, dc Add ativan q6h Assess qtc, may need pcxr for atx   Hypoglycemia - likely r/t unintentional insulin OD  DM  PLAN -  PRN Dextrose  frequent CBG's  SSI low dose Start feeds as able,  assess this am intake Get follow up LFT  HTN  PLAN -  Hold home HCTZ for now   To ccm service, remains on precedex   Ccm time 30 min   Lavon Paganini. Titus Mould, MD, [place] Pgr: [facility]

## 2014-10-13 ENCOUNTER — Inpatient Hospital Stay (HOSPITAL_COMMUNITY): Payer: Medicaid Other

## 2014-10-13 LAB — GLUCOSE, CAPILLARY
GLUCOSE-CAPILLARY: 158 mg/dL — AB (ref 65–99)
GLUCOSE-CAPILLARY: 200 mg/dL — AB (ref 65–99)
GLUCOSE-CAPILLARY: 206 mg/dL — AB (ref 65–99)
GLUCOSE-CAPILLARY: 214 mg/dL — AB (ref 65–99)
GLUCOSE-CAPILLARY: 256 mg/dL — AB (ref 65–99)
Glucose-Capillary: 143 mg/dL — ABNORMAL HIGH (ref 65–99)
Glucose-Capillary: 271 mg/dL — ABNORMAL HIGH (ref 65–99)
Glucose-Capillary: 133 mg/dL — ABNORMAL HIGH (ref 65–99)

## 2014-10-13 MED ORDER — INSULIN ASPART 100 UNIT/ML ~~LOC~~ SOLN
0.0000 [IU] | Freq: Three times a day (TID) | SUBCUTANEOUS | Status: DC
  Administered 2014-10-13: 5 [IU] via SUBCUTANEOUS
  Administered 2014-10-14: 3 [IU] via SUBCUTANEOUS

## 2014-10-13 MED ORDER — SODIUM CHLORIDE 0.9 % IV BOLUS (SEPSIS)
1000.0000 mL | Freq: Once | INTRAVENOUS | Status: AC
Start: 2014-10-13 — End: 2014-10-13
  Administered 2014-10-13: 500 mL via INTRAVENOUS

## 2014-10-13 MED ORDER — INSULIN ASPART 100 UNIT/ML ~~LOC~~ SOLN: 0.0000 [IU] | Freq: Every day | SUBCUTANEOUS | Status: DC

## 2014-10-13 NOTE — Progress Notes (Signed)
Pt IV infiltrated after 500/1000L bolus infused. MD called. SBP back in the 100s MAP in 80s despite only 1/2 of bolus infusing. MD gave orders to hold pt transfer until pressure remained stable. IV team consulted to get new IV, infiltrated IV pulled. Will continue to monitor.

## 2014-10-13 NOTE — Progress Notes (Signed)
IMTS  Spoke with Dr Ronnald Ramp, patient to be transferred out of ICU, IMTS will resume care as primary team on 9/21.  Dr Evette Doffing will be attending.  Lucious Groves, DO

## 2014-10-13 NOTE — Progress Notes (Signed)
eLink Physician-Brief Progress Note Patient Name: Michael [name] DOB: 1955-01-30 MRN: [id number]   Date of Service  10/13/2014  HPI/Events of Note  Blood glucose = 256.  Patient is on a PO diet.  eICU Interventions  Will order: 1. AC/HS accuchecks. Sensitive Novolog SSI.     Intervention Category Intermediate Interventions: Hyperglycemia - evaluation and treatment  Sommer,Steven Eugene 10/13/2014, 4:25 PM

## 2014-10-13 NOTE — Progress Notes (Signed)
[facility] Progress Note Patient Name: GARLIN BATDORF DOB: Jun 06, 1955 MRN: [id number]   Date of Service  10/13/2014  HPI/Events of Note  Hypotension - BP = 83/56.   eICU Interventions  Will order: 1. 0.9 NaCl 1 liter IV over 1 hour now.  2. Hold transfer to stepdown unit.      Intervention Category Intermediate Interventions: Hypotension - evaluation and management  Sommer,Steven Eugene 10/13/2014, 5:55 PM

## 2014-10-13 NOTE — Consult Note (Addendum)
   Name: ARDEAN MELROY MRN: [id number] DOB: 06-24-1955    ADMISSION DATE:  10/08/2014 CONSULTATION DATE:  9/18  REFERRING MD :  IMTS   CHIEF COMPLAINT:  ETOH withdrawal   BRIEF PATIENT DESCRIPTION:  59yo male with hx DM, ETOH abuse, bipolar disorder, chronic pancreatitis, HTN admitted 9/15 with hypoglycemic seizure.  Was improving on med-surg floor, being treated with IV dextrose but began having increased agitation likely r/t ETOH withdrawal.  PCCM consulted for tx ICU and ?precedex.   SIGNIFICANT EVENTS  9/18- required precedex  STUDIES:  CT head 9/16>>> neg acute   SUBJECTIVE:  Awake, chair, no distress, off precedex  VITAL SIGNS: Temp:  [97.4 F (36.3 C)-99.7 F (37.6 C)] 97.4 F (36.3 C) (09/20 1230) Pulse Rate:  [56-80] 80 (09/20 1100) Resp:  [12-20] 15 (09/20 1100) BP: (101-166)/(74-146) 166/146 mmHg (09/20 1100) SpO2:  [100 %] 100 % (09/20 1100)  PHYSICAL EXAMINATION: General:  Pleasant, chronically ill appearing male, NAD Neuro:  Awake, nonfocal, calm HEENT:  Mm moist, no JVD  Cardiovascular:  s1s2 rrr , 63 Lungs:  CTA Abdomen:  Soft, non tender, +bs  Musculoskeletal:  Warm and dry, no edema    Recent Labs Lab 10/10/14 1328 10/10/14 2049 10/11/14 1143  NA 139 133* 141  K 3.5 4.8 4.2  CL 106 102 106  CO2 25 22 27   BUN <5* 5* 5*  CREATININE 0.67 0.75 0.56*  GLUCOSE 128* 312* 156*    Recent Labs Lab 10/09/14 0415 10/10/14 0336 10/11/14 1143  HGB 15.1 14.3 14.6  HCT 42.0 40.4 41.6  WBC 9.6 9.0 8.3  PLT 105* 92* 114*   Dg Chest Port 1 View  10/13/2014   CLINICAL DATA:  Alcohol withdrawal.  EXAM: PORTABLE CHEST - 1 VIEW  COMPARISON:  10/12/2014.  FINDINGS: Mediastinum and hilar structures are normal. Interim development of bibasilar atelectasis and/or infiltrates. Aspiration cannot be excluded. No pleural effusion or pneumothorax. Heart size normal . No acute bony abnormality.  IMPRESSION: Interim development of bibasilar atelectasis and/or  infiltrates. Aspiration cannot be excluded.   Electronically Signed   By: Marcello Moores  Register   On: 10/13/2014 07:39   Dg Chest Port 1 View  10/12/2014   CLINICAL DATA:  Alcohol withdrawal.  Seizures.  EXAM: PORTABLE CHEST - 1 VIEW  COMPARISON:  11/13/2010  FINDINGS: The heart size and mediastinal contours are within normal limits. Both lungs are clear. The visualized skeletal structures are unremarkable.  IMPRESSION: No active disease.   Electronically Signed   By: Earle Gell M.D.   On: 10/12/2014 07:30    ASSESSMENT / PLAN:  Agitation/ ETOH withdrawal  Hx bipolar disorder  PLAN -   precedex off Thimaine, folic, MVI prozac  Continued ativan q6h Valium ineffective, avoid Off precedex, no restart  Hypoglycemia - resolved DM  PLAN -  PRN Dextrose not needed If glu greater 220 x 2 then add ssi  frequent CBG's   HTN  PLAN -  Hold home HCTZ further  Off precedex  Back to IMTS To med  Diet started Refusing labs Dc mag  Lavon Paganini. Titus Mould, MD, [place] Pgr: [facility]

## 2014-10-14 DIAGNOSIS — K860 Alcohol-induced chronic pancreatitis: Secondary | ICD-10-CM

## 2014-10-14 DIAGNOSIS — F10239 Alcohol dependence with withdrawal, unspecified: Secondary | ICD-10-CM

## 2014-10-14 DIAGNOSIS — I10 Essential (primary) hypertension: Secondary | ICD-10-CM

## 2014-10-14 DIAGNOSIS — R569 Unspecified convulsions: Secondary | ICD-10-CM

## 2014-10-14 DIAGNOSIS — Z8639 Personal history of other endocrine, nutritional and metabolic disease: Secondary | ICD-10-CM

## 2014-10-14 DIAGNOSIS — K219 Gastro-esophageal reflux disease without esophagitis: Secondary | ICD-10-CM

## 2014-10-14 DIAGNOSIS — E876 Hypokalemia: Secondary | ICD-10-CM

## 2014-10-14 DIAGNOSIS — E785 Hyperlipidemia, unspecified: Secondary | ICD-10-CM

## 2014-10-14 DIAGNOSIS — B1910 Unspecified viral hepatitis B without hepatic coma: Secondary | ICD-10-CM

## 2014-10-14 DIAGNOSIS — E11649 Type 2 diabetes mellitus with hypoglycemia without coma: Secondary | ICD-10-CM

## 2014-10-14 LAB — GLUCOSE, CAPILLARY
GLUCOSE-CAPILLARY: 202 mg/dL — AB (ref 65–99)
Glucose-Capillary: 147 mg/dL — ABNORMAL HIGH (ref 65–99)
Glucose-Capillary: 205 mg/dL — ABNORMAL HIGH (ref 65–99)
Glucose-Capillary: 213 mg/dL — ABNORMAL HIGH (ref 65–99)
Glucose-Capillary: 276 mg/dL — ABNORMAL HIGH (ref 65–99)
Glucose-Capillary: 281 mg/dL — ABNORMAL HIGH (ref 65–99)

## 2014-10-14 LAB — COMPREHENSIVE METABOLIC PANEL
ALBUMIN: 2.6 g/dL — AB (ref 3.5–5.0)
ALT: 44 U/L (ref 17–63)
ANION GAP: 6 (ref 5–15)
AST: 42 U/L — AB (ref 15–41)
Alkaline Phosphatase: 120 U/L (ref 38–126)
BILIRUBIN TOTAL: 0.7 mg/dL (ref 0.3–1.2)
BUN: 7 mg/dL (ref 6–20)
CHLORIDE: 104 mmol/L (ref 101–111)
CO2: 26 mmol/L (ref 22–32)
Calcium: 8.3 mg/dL — ABNORMAL LOW (ref 8.9–10.3)
Creatinine, Ser: 0.66 mg/dL (ref 0.61–1.24)
GFR calc Af Amer: >60 mL/min (ref >60)
GLUCOSE: 232 mg/dL — AB (ref 65–99)
POTASSIUM: 3.6 mmol/L (ref 3.5–5.1)
Sodium: 136 mmol/L (ref 135–145)
TOTAL PROTEIN: 5.3 g/dL — AB (ref 6.5–8.1)

## 2014-10-14 LAB — CK: CK TOTAL: 279 U/L (ref 49–397)

## 2014-10-14 LAB — MAGNESIUM: MAGNESIUM: 1.5 mg/dL — AB (ref 1.7–2.4)

## 2014-10-14 MED ORDER — LORAZEPAM 2 MG/ML IJ SOLN
1.0000 mg | Freq: Four times a day (QID) | INTRAMUSCULAR | Status: DC | PRN
  Administered 2014-10-14: 1 mg via INTRAVENOUS
  Filled 2014-10-14: qty 1

## 2014-10-14 MED ORDER — LORAZEPAM 1 MG PO TABS
1.0000 mg | ORAL_TABLET | Freq: Four times a day (QID) | ORAL | Status: DC | PRN
  Filled 2014-10-14: qty 1

## 2014-10-14 MED ORDER — PANCRELIPASE (LIP-PROT-AMYL) 36000-114000 UNITS PO CPEP
36000.0000 [IU] | ORAL_CAPSULE | Freq: Three times a day (TID) | ORAL | Status: DC
  Administered 2014-10-14 – 2014-10-15 (×4): 36000 [IU] via ORAL
  Filled 2014-10-14 (×7): qty 1

## 2014-10-14 MED ORDER — ADULT MULTIVITAMIN W/MINERALS CH
1.0000 | ORAL_TABLET | Freq: Every day | ORAL | Status: DC
  Administered 2014-10-14 – 2014-10-15 (×2): 1 via ORAL
  Filled 2014-10-14 (×2): qty 1

## 2014-10-14 MED ORDER — IBUPROFEN 400 MG PO TABS
400.0000 mg | ORAL_TABLET | Freq: Four times a day (QID) | ORAL | Status: DC | PRN
  Administered 2014-10-14: 400 mg via ORAL
  Filled 2014-10-14: qty 2

## 2014-10-14 MED ORDER — PRAVASTATIN SODIUM 10 MG PO TABS
40.0000 mg | ORAL_TABLET | Freq: Every day | ORAL | Status: DC
  Administered 2014-10-14: 40 mg via ORAL
  Filled 2014-10-14: qty 1
  Filled 2014-10-14: qty 4

## 2014-10-14 MED ORDER — LORAZEPAM BOLUS VIA INFUSION: 1.0000 mg | Freq: Once | INTRAVENOUS | Status: DC

## 2014-10-14 MED ORDER — LORAZEPAM 1 MG PO TABS: 2.0000 mg | ORAL_TABLET | Freq: Four times a day (QID) | ORAL | Status: DC | PRN

## 2014-10-14 MED ORDER — ASPIRIN EC 81 MG PO TBEC
81.0000 mg | DELAYED_RELEASE_TABLET | Freq: Every day | ORAL | Status: DC
  Administered 2014-10-14: 81 mg via ORAL
  Filled 2014-10-14 (×2): qty 1

## 2014-10-14 MED ORDER — VITAMIN B-1 100 MG PO TABS
100.0000 mg | ORAL_TABLET | Freq: Every day | ORAL | Status: DC
Start: 2014-10-14 — End: 2014-10-15
  Administered 2014-10-14 – 2014-10-15 (×2): 100 mg via ORAL
  Filled 2014-10-14 (×2): qty 1

## 2014-10-14 MED ORDER — VITAMIN D (ERGOCALCIFEROL) 1.25 MG (50000 UNIT) PO CAPS
50000.0000 [IU] | ORAL_CAPSULE | ORAL | Status: DC
Start: 2014-10-15 — End: 2014-10-15
  Administered 2014-10-15: 50000 [IU] via ORAL
  Filled 2014-10-14 (×3): qty 1

## 2014-10-14 MED ORDER — LORAZEPAM 2 MG/ML IJ SOLN
1.0000 mg | Freq: Once | INTRAMUSCULAR | Status: AC
  Administered 2014-10-14: 1 mg via INTRAVENOUS
  Filled 2014-10-14: qty 1

## 2014-10-14 MED ORDER — LORAZEPAM 2 MG/ML IJ SOLN
2.0000 mg | Freq: Four times a day (QID) | INTRAMUSCULAR | Status: DC | PRN
  Administered 2014-10-15 (×2): 2 mg via INTRAVENOUS
  Filled 2014-10-14 (×2): qty 1

## 2014-10-14 MED ORDER — POTASSIUM CHLORIDE CRYS ER 20 MEQ PO TBCR
40.0000 meq | EXTENDED_RELEASE_TABLET | Freq: Once | ORAL | Status: AC
  Administered 2014-10-14: 40 meq via ORAL
  Filled 2014-10-14: qty 2

## 2014-10-14 MED ORDER — THIAMINE HCL 100 MG/ML IJ SOLN
100.0000 mg | Freq: Every day | INTRAMUSCULAR | Status: DC
  Filled 2014-10-14: qty 1

## 2014-10-14 MED ORDER — FOLIC ACID 1 MG PO TABS
1.0000 mg | ORAL_TABLET | Freq: Every day | ORAL | Status: DC
  Administered 2014-10-14 – 2014-10-15 (×2): 1 mg via ORAL
  Filled 2014-10-14 (×2): qty 1

## 2014-10-14 MED ORDER — MAGNESIUM SULFATE 2 GM/50ML IV SOLN
2.0000 g | Freq: Once | INTRAVENOUS | Status: AC
  Administered 2014-10-14: 2 g via INTRAVENOUS
  Filled 2014-10-14: qty 50

## 2014-10-14 NOTE — Progress Notes (Signed)
Attempted to call repor 6Nt nurse not available at this time will call back as soon as she can.

## 2014-10-14 NOTE — Care Management Note (Signed)
Case Management Note  Patient Details  Name: GREGARY BLACKARD MRN: [id number] Date of Birth: 07/05/1955  Subjective/Objective:      Patient sitting up in chair.  States has an apartment but was told to leave.  Has been living with his sister.  States he can probably go back, but needs to get his furniture out of his apartment.  Patient very vague on answers and is hard to keep him on track of what we are talking about.               Action/Plan:   Expected Discharge Date:                  Expected Discharge Plan:  Home/Self Care  In-House Referral:  Clinical Social Work  Discharge planning Services  CM Consult  Post Acute Care Choice:    Choice offered to:     DME Arranged:    DME Agency:     HH Arranged:    HH Agency:     Status of Service:  In process, will continue to follow  Medicare Important Message Given:    Date Medicare IM Given:    Medicare IM give by:    Date Additional Medicare IM Given:    Additional Medicare Important Message give by:     If discussed at [place] of Stay Meetings, dates discussed:    Additional Comments:  Ajamu, Maxon, RN 10/14/2014, 2:39 PM

## 2014-10-14 NOTE — Progress Notes (Signed)
Subjective: Patient seen and examined in the ICU today. He was AAO x3 and appeared to be in no acute distress. Denies any chest pain, SOB, or abdominal pain. No other complaints.   Patient was transferred back from ICU to our service today.   Objective: Vital signs in last 24 hours: Filed Vitals:   10/14/14 0700 10/14/14 0854 10/14/14 1206 10/14/14 1458  BP: 118/76   160/87  Pulse: 69   110  Temp:  98.8 F (37.1 C) 99 F (37.2 C) 98.7 F (37.1 C)  TempSrc:  Axillary Oral Oral  Resp: 16   20  Weight:      SpO2: 92%   100%   Weight change:   Intake/Output Summary (Last 24 hours) at 10/14/14 1625 Last data filed at 10/14/14 1600  Gross per 24 hour  Intake    600 ml  Output    250 ml  Net    350 ml   Physical Exam  Constitutional: He is oriented to person, place, and time.  Cardiovascular: Normal rate, regular rhythm and intact distal pulses.  Exam reveals no gallop and no friction rub.   No murmur heard. Pulmonary/Chest: Effort normal and breath sounds normal. No respiratory distress. He has no wheezes. He has no rales.  Abdominal: Soft. Bowel sounds are normal. He exhibits no distension. There is no tenderness.  Musculoskeletal: He exhibits edema.  Non-pitting edema of L hand   Neurological: He is alert and oriented to person, place, and time.  Skin: Skin is warm and dry.   Assessment/Plan: Active Problems:   Hypoglycemia   Seizure   Seizures   Alcohol withdrawal   Fall   Vitamin D deficiency  Agitation and delirium in setting of probable alcohol withdrawal and bipolar disorder/depression - Pt reported last drink day before admission with negative level on admission. He was transferred from SDU to the ICU on 10/11/14 in the setting of persistent agitation and combativeness despite being on CIWA and scheduled valium. Patient transferred back to internal medicine teaching service today.  -continue Lorazepam 1 mg IV q6 -continue Lorazepam as per CIWA  protocol -Multivitamins, Thiamine, Folic acid -Continue home meds Abilify 22 mg daily and Prozac 20 mg BID  Hypoglycemia in setting of well-controlled Type 2 DM - Last CBG 147. A1c 6.8 on 9/16. Pt with unintentional insulin overdose on admission consistent with low-c peptide. Unclear how much insulin pt took at home, was supposed to be on Lantus 40 U at bedtime and Novolog 8 U BID. Pt also on metformin 1000 mg BID.  -HOLD insulin and oral diabetes medications for now  -Monitor CBG  Acute on Chronic Hypokalemia - Last K 3.6 and Mag 1.5 today. Etiology most likely due to insulin overdose and hypomagnesemia in setting of alcohol abuse. Pt also on HCTZ at home.  -Monitor BMP and Mg level  -Potassium chloride 40 mEq once today -Magnesium 2g IV today  -Hold home HCTZ 25 mg daily  Seizure in setting of hypoglycemia - Pt with no further seizure activity. CK level elevated at 4667 on admission, normal now.   -Continue seizure precautions  Hepatitis IgM Core Positive Ab - Pt with negative Hep B surface antigen and positive core IgM. This could indicate an acute infection which is still in the window period.   -Will make suggestion to PCP to repeat labs in 4-6 months.   Hypertension - Currently hypertensive at 160/87. However, patient was hypotensive in the ICU.  -Continue to hold home HCTZ  25 mg daily -monitor vitals   Chronic Pancreatitis in setting of alcohol abuse - Currently with no abdominal pain.  -Continue home medication creon 26K U TID with meals   Hyperlipidemia - Last lipid panel on 01/19/14 with LDL 78. Goal LDL <100. CK normal now.  -Continue home medication Pravastatin 40 mg daily   History of vitamin D deficiency - No prior level. Pt's home medication list with ergocalciferol 50U weekly. 25-hydroxy Vit D checked during this hospitalization was low (7.8). -Continue Vit D 50,000 U qd  GERD - Currently with no acid reflux symptoms.  -Continue home protonix 40 mg  daily  Diet: Regular    DVT Ppx: Lovenox   Code: Full    Dispo: Disposition is deferred at this time, awaiting improvement of current medical problems.  Anticipated discharge in approximately 1 day(s).   The patient does have a current PCP (Juluis Mire, MD) and does need an [facility] hospital follow-up appointment after discharge.  The patient does not have transportation limitations that hinder transportation to clinic appointments.  .Services Needed at time of discharge: Y = Yes, Blank = No PT:   OT:   RN:   Equipment:   Other:     LOS: 5 days   Shela Leff, MD 10/14/2014, 4:25 PM

## 2014-10-14 NOTE — Progress Notes (Signed)
Pt iv noted to be leaking. assesment showed that iv had come out. Attempted to restart iv with no success. IV team consulted

## 2014-10-14 NOTE — Progress Notes (Signed)
Results for VARUN, JOURDAN (MRN [id number]) as of 10/14/2014 13:51  Ref. Range 10/13/2014 21:26 10/13/2014 23:34 10/14/2014 02:55 10/14/2014 08:27 10/14/2014 11:31  Glucose-Capillary Latest Ref Range: 65-99 mg/dL 133 (H) 205 (H) 213 (H) 202 (H) 147 (H)  Recommend adding Novolog SENSITIVE correction scale TID & HS if CBGs continue to be greater than 180 mg/dl. Will continue to follow while in hospital. Harvel Ricks RN BSN CDE

## 2014-10-15 DIAGNOSIS — E559 Vitamin D deficiency, unspecified: Secondary | ICD-10-CM

## 2014-10-15 LAB — CBC
HCT: 39.3 % (ref 39.0–52.0)
HEMOGLOBIN: 13.9 g/dL (ref 13.0–17.0)
MCH: 35 pg — AB (ref 26.0–34.0)
MCHC: 35.4 g/dL (ref 30.0–36.0)
MCV: 99 fL (ref 78.0–100.0)
Platelets: 153 10*3/uL (ref 150–400)
RBC: 3.97 MIL/uL — AB (ref 4.22–5.81)
RDW: 13.3 % (ref 11.5–15.5)
WBC: 6.5 10*3/uL (ref 4.0–10.5)

## 2014-10-15 LAB — RENAL FUNCTION PANEL
ANION GAP: 6 (ref 5–15)
Albumin: 2.8 g/dL — ABNORMAL LOW (ref 3.5–5.0)
BUN: <5 mg/dL — ABNORMAL LOW (ref 6–20)
CHLORIDE: 103 mmol/L (ref 101–111)
CO2: 27 mmol/L (ref 22–32)
Calcium: 8.5 mg/dL — ABNORMAL LOW (ref 8.9–10.3)
Creatinine, Ser: 0.53 mg/dL — ABNORMAL LOW (ref 0.61–1.24)
GFR calc non Af Amer: >60 mL/min (ref >60)
Glucose, Bld: 215 mg/dL — ABNORMAL HIGH (ref 65–99)
POTASSIUM: 3.6 mmol/L (ref 3.5–5.1)
Phosphorus: 3 mg/dL (ref 2.5–4.6)
Sodium: 136 mmol/L (ref 135–145)

## 2014-10-15 LAB — GLUCOSE, CAPILLARY
GLUCOSE-CAPILLARY: 209 mg/dL — AB (ref 65–99)
Glucose-Capillary: 215 mg/dL — ABNORMAL HIGH (ref 65–99)

## 2014-10-15 LAB — MAGNESIUM: Magnesium: 1.7 mg/dL (ref 1.7–2.4)

## 2014-10-15 MED ORDER — MAGNESIUM OXIDE 400 (241.3 MG) MG PO TABS: 400.0000 mg | ORAL_TABLET | Freq: Every day | ORAL | Status: DC

## 2014-10-15 MED ORDER — METFORMIN HCL 500 MG PO TABS: 1000.0000 mg | ORAL_TABLET | Freq: Two times a day (BID) | ORAL | Status: DC

## 2014-10-15 MED ORDER — MAGNESIUM SULFATE 2 GM/50ML IV SOLN
2.0000 g | Freq: Once | INTRAVENOUS | Status: DC
  Filled 2014-10-15: qty 50

## 2014-10-15 MED ORDER — MAGNESIUM OXIDE 400 (241.3 MG) MG PO TABS
400.0000 mg | ORAL_TABLET | Freq: Every day | ORAL | Status: DC
Start: 2014-10-15 — End: 2014-10-15

## 2014-10-15 MED ORDER — FOLIC ACID 1 MG PO TABS
1.0000 mg | ORAL_TABLET | Freq: Every day | ORAL | Status: DC
Start: 2014-10-15 — End: 2014-10-15

## 2014-10-15 MED ORDER — POTASSIUM CHLORIDE CRYS ER 20 MEQ PO TBCR: 40.0000 meq | EXTENDED_RELEASE_TABLET | Freq: Once | ORAL | Status: DC

## 2014-10-15 MED ORDER — VITAMIN D (ERGOCALCIFEROL) 1.25 MG (50000 UNIT) PO CAPS: 50000.0000 [IU] | ORAL_CAPSULE | ORAL | Status: DC

## 2014-10-15 NOTE — Care Management Note (Addendum)
Case Management Note  Patient Details  Name: Michael [name] MRN: [id number] Date of Birth: December 20, 1955  Subjective/Objective:                    Action/Plan:  Home health RN for medication recon . Patient currently confused will follow  Received order for home health RN , paged Dr Marlowe Sax for clarification on instructions for home health RN . Awaiting call back  Expected Discharge Date:                  Expected Discharge Plan:  Home/Self Care  In-House Referral:  Clinical Social Work  Discharge planning Services  CM Consult  Post Acute Care Choice:    Choice offered to:     DME Arranged:    DME Agency:     HH Arranged:    HH Agency:     Status of Service:  In process, will continue to follow  Medicare Important Message Given:    Date Medicare IM Given:    Medicare IM give by:    Date Additional Medicare IM Given:    Additional Medicare Important Message give by:     If discussed at [place] of Stay Meetings, dates discussed:    Additional Comments:  Marilu Favre, RN 10/15/2014, 11:21 AM
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
## 2014-10-15 NOTE — Care Management Note (Signed)
Case Management Note  Patient Details  Name: Michael [name] MRN: [id number] Date of Birth: 1956-01-02  Subjective/Objective:                    Action/Plan:  Discussed discharge planning with patient and Dr Naaman Plummer at bedside. Patient consulted for [place] to discuss his discharge plan with his sisters / brother - in -Sports coach.  Called sister Cillian Gwinner [phone] received message that , that phone number does not accept incoming calls.  Called sister [name] [phone] , her husband Felicity Pellegrini answered phone . [name] had surgery today at [facility], [name] is waiting to speak to her doctor , once he does that he plans to take [name] home with him . Him and his wife planned on moving [name] in with them . They have others to help take care of [name] ( 24 hour supervision ) .   Explained home health RN visits . [name] in agreement and would like [facility] .   Patient's discharging address is : [address] , [place] , phone [phone] . Patient in agreement with discharge.  Dr Naaman Plummer aware and spoke with [name] via phone also .    Expected Discharge Date:                  Expected Discharge Plan:  [place]  In-House Referral:  Clinical Social Work  Discharge planning Services  CM Consult  Post Acute Care Choice:  Home Health Choice offered to:  Patient, Sibling  DME Arranged:    DME Agency:     HH Arranged:  RN [place] Agency:  [place]  Status of Service:  Completed, signed off  Medicare Important Message Given:    Date Medicare IM Given:    Medicare IM give by:    Date Additional Medicare IM Given:    Additional Medicare Important Message give by:     If discussed at [place] of Stay Meetings, dates discussed:    Additional Comments:  Marilu Favre, RN 10/15/2014, 1:43 PM

## 2014-10-15 NOTE — Progress Notes (Signed)
Subjective: Patient seen and examined at bedside at approximately 7:45 am this morning. He was AAO x3 and appeared to be in no acute distress. Denied having any chest pain, SOB, or abdominal pain. No other complaints.  Later in the morning, patient was seen again during teaching rounds. This time he appeared somnolent but arousable on verbal command. It was difficult to assess his orientation.   Objective: Vital signs in last 24 hours: Filed Vitals:   10/14/14 1206 10/14/14 1458 10/14/14 2011 10/15/14 0528  BP:  160/87 156/88 138/86  Pulse:  110 100 96  Temp: 99 F (37.2 C) 98.7 F (37.1 C) 98.5 F (36.9 C) 97.9 F (36.6 C)  TempSrc: Oral Oral Oral Oral  Resp:  20 19 18   Weight:      SpO2:  100% 100% 100%   Weight change:   Intake/Output Summary (Last 24 hours) at 10/15/14 1135 Last data filed at 10/14/14 2109  Gross per 24 hour  Intake    543 ml  Output      0 ml  Net    543 ml   Physical Exam  Constitutional: He is oriented to person, place, and time.  Cardiovascular: Normal rate, regular rhythm and intact distal pulses.  Exam reveals no gallop and no friction rub.   No murmur heard. Pulmonary/Chest: Effort normal and breath sounds normal. No respiratory distress. He has no wheezes. He has no rales.  Abdominal: Soft. Bowel sounds are normal. He exhibits no distension. There is no tenderness.  Musculoskeletal: He exhibits edema.  Trace non-pitting edema of L hand   Neurological: He is alert and oriented to person, place, and time.  Skin: Skin is warm and dry.   Assessment/Plan: Active Problems:   Hypoglycemia   Seizure   Seizures   Alcohol withdrawal   Fall   Vitamin D deficiency  Agitation and delirium in setting of probable alcohol withdrawal and bipolar disorder/depression - Pt reported last drink day before admission with negative level on admission. He was transferred from SDU to the ICU on 10/11/14 in the setting of persistent agitation and combativeness  despite being on CIWA and scheduled valium. Patient transferred back to internal medicine teaching service yesterday. He was AAOx3 then and early this morning. However, appeared somnolent during teaching rounds. Likely secondary to benzodiazepine use.  -HOLD Lorazepam 1 mg IV q6 and Lorazepam as per CIWA protocol -Reassess patient in the afternoon  -Multivitamins, Thiamine, Folic acid -Continue home meds Abilify 22 mg daily and Prozac 20 mg BID  Hypoglycemia in setting of well-controlled Type 2 DM - Last CBG 276. A1c 6.8 on 9/16. Pt with unintentional insulin overdose on admission consistent with low-c peptide. Unclear how much insulin he took at home, was supposed to be on Lantus 40 U at bedtime and Novolog 8 U BID. Pt also on metformin 1000 mg BID.  -HOLD insulin and oral diabetes medications for now. His blood glucose is high, consider restarting home dose of Metformin upon discharge. Due to history of hypoglycemia and resulting seizures, patient is not a candidate for insulin therapy.  -Monitor CBG -HHRN for medication reconciliation   Acute on Chronic Hypokalemia - K 3.6 and Mag 1.7 today. Etiology most likely due to insulin overdose and hypomagnesemia in setting of alcohol abuse. Pt also on HCTZ at home.  -Monitor BMP and Mg level  -Potassium chloride 40 mEq once today -Magnesium 2g IV today. Continue Magnesium oxide 400 mg daily.  -Hold home HCTZ 25 mg daily  Seizure in setting of hypoglycemia - Pt with no further seizure activity. CK level elevated at 4667 on admission, normal now.   -Continue seizure precautions  Hepatitis IgM Core Positive Ab - Pt with negative Hep B surface antigen and positive core IgM. This could indicate an acute infection which is still in the window period.   -Will make suggestion to PCP to repeat labs in 4-6 months.   Hypertension - Currently normotensive at 138/86. However, patient was hypotensive in the ICU.  -Continue to hold home HCTZ 25 mg  daily -monitor vitals   Chronic Pancreatitis in setting of alcohol abuse - Currently with no abdominal pain.  -Continue home medication creon 26K U TID with meals   Hyperlipidemia - Last lipid panel on 01/19/14 with LDL 78. Goal LDL <100. CK normal now.  -Continue home medication Pravastatin 40 mg daily   History of vitamin D deficiency - No prior level. Pt's home medication list with ergocalciferol 50U weekly. 25-hydroxy Vit D checked during this hospitalization was low (7.8). -Continue Vit D 50,000 U qd  GERD - Currently with no acid reflux symptoms.  -Continue home protonix 40 mg daily  Diet: Regular    DVT Ppx: Lovenox   Code: Full   Addendum: Saw patient on 10/15/14 at 1:45 pm. He is now AAOx3 and is in no acute distress. Patient is to be discharged today and will be staying with his sister and brother in law.   Dispo: Disposition is deferred at this time, awaiting improvement of current medical problems.  Anticipated discharge in approximately 1 day(s).   The patient does have a current PCP (Juluis Mire, MD) and does need an [facility] hospital follow-up appointment after discharge.  The patient does not have transportation limitations that hinder transportation to clinic appointments.  .Services Needed at time of discharge: Y = Yes, Blank = No PT:   OT:   RN:   Equipment:   Other:     LOS: 6 days   Shela Leff, MD 10/15/2014, 11:35 AM

## 2014-10-15 NOTE — Progress Notes (Signed)
[name] to be D/C'd Home per MD order.  Discussed with the patient and all questions fully answered.  VSS, Skin clean, dry and intact without evidence of skin break down, no evidence of skin tears noted. IV catheter discontinued intact. Site without signs and symptoms of complications. Dressing and pressure applied.  An After Visit Summary was printed and given to the patient. Patient received prescription.  D/c education completed with patient/family including follow up instructions, medication list, d/c activities limitations if indicated, with other d/c instructions as indicated by MD - patient able to verbalize understanding, all questions fully answered.   Patient instructed to return to ED, call 911, or call MD for any changes in condition.   Patient escorted via [place], and D/C home via private auto.  Theora Master 10/15/2014 3:41 PM

## 2014-10-15 NOTE — Discharge Instructions (Signed)
-  STOP taking insulin (lantus and novolog)  -Continue taking Metformin 1000 mg twice daily  -Start taking Magnesium oxide 400 mg daily  -Start taking Vitamin D 1 capsule once a week for a total of 7 weeks Take medication every Thursday.  -STOP taking hydrochlorothiazide for blood pressure.   -Follow up with your primary care doctor: Dr. Naaman Plummer on 10/23/14 at 2:15 pm.   Low Blood Sugar Low blood sugar (hypoglycemia) means that the level of sugar in your blood is lower than it should be. Signs of low blood sugar include:  Getting sweaty.  Feeling hungry.  Feeling dizzy or weak.  Feeling sleepier than normal.  Feeling nervous.  Headaches.  Having a fast heartbeat. Low blood sugar can happen fast and can be an emergency. Your doctor can do tests to check your blood sugar level. You can have low blood sugar and not have diabetes. HOME CARE  Check your blood sugar as told by your doctor. If it is less than 70 mg/dl or as told by your doctor, take 1 of the following:  3 to 4 glucose tablets.   cup clear juice.   cup soda pop, not diet.  1 cup milk.  5 to 6 hard candies.  Recheck blood sugar after 15 minutes. Repeat until it is at the right level.  Eat a snack if it is more than 1 hour until the next meal.  Only take medicine as told by your doctor.  Do not skip meals. Eat on time.  Do not drink alcohol except with meals.  Check your blood glucose before driving.  Check your blood glucose before and after exercise.  Always carry treatment with you, such as glucose pills.  Always wear a medical alert bracelet if you have diabetes. GET HELP RIGHT AWAY IF:   Your blood glucose goes below 70 mg/dl or as told by your doctor, and you:  Are confused.  Are not able to swallow.  Pass out (faint).  You cannot treat yourself. You may need someone to help you.  You have low blood sugar problems often.  You have problems from your medicines.  You are not  feeling better after 3 to 4 days.  You have vision changes. MAKE SURE YOU:   Understand these instructions.  Will watch this condition.  Will get help right away if you are not doing well or get worse. Document Released: 04/05/2009 Document Revised: 04/03/2011 Document Reviewed: 04/05/2009 [facility] Patient Information 2015 [place], [place]. This information is not intended to replace advice given to you by your health care provider. Make sure you discuss any questions you have with your health care provider.

## 2014-10-18 LAB — SULFONYLUREA HYPOGLYCEMICS PANEL, SERUM
Acetohexamide: NEGATIVE ug/mL (ref 20–60)
CHLORPROPAMIDE: NEGATIVE ug/mL (ref 75–250)
GLIPIZIDE: NEGATIVE ng/mL (ref 200–1000)
Glimepiride: NEGATIVE ng/mL (ref 80–250)
Glyburide: NEGATIVE ng/mL
Nateglinide: NEGATIVE ng/mL
REPAGLINIDE: NEGATIVE ng/mL
TOLAZAMIDE: NEGATIVE ug/mL
Tolbutamide: NEGATIVE ug/mL (ref 40–100)

## 2014-10-23 ENCOUNTER — Ambulatory Visit (INDEPENDENT_AMBULATORY_CARE_PROVIDER_SITE_OTHER): Payer: Medicaid Other | Admitting: Internal Medicine

## 2014-10-23 DIAGNOSIS — E1142 Type 2 diabetes mellitus with diabetic polyneuropathy: Secondary | ICD-10-CM

## 2014-10-23 DIAGNOSIS — Z79899 Other long term (current) drug therapy: Secondary | ICD-10-CM

## 2014-10-23 DIAGNOSIS — I10 Essential (primary) hypertension: Secondary | ICD-10-CM | POA: Diagnosis not present

## 2014-10-23 DIAGNOSIS — E559 Vitamin D deficiency, unspecified: Secondary | ICD-10-CM | POA: Diagnosis not present

## 2014-10-23 DIAGNOSIS — K860 Alcohol-induced chronic pancreatitis: Secondary | ICD-10-CM | POA: Diagnosis not present

## 2014-10-23 DIAGNOSIS — F17210 Nicotine dependence, cigarettes, uncomplicated: Secondary | ICD-10-CM

## 2014-10-23 DIAGNOSIS — F1020 Alcohol dependence, uncomplicated: Secondary | ICD-10-CM

## 2014-10-23 DIAGNOSIS — E1140 Type 2 diabetes mellitus with diabetic neuropathy, unspecified: Secondary | ICD-10-CM

## 2014-10-23 DIAGNOSIS — Z23 Encounter for immunization: Secondary | ICD-10-CM | POA: Diagnosis not present

## 2014-10-23 DIAGNOSIS — Z0000 Encounter for general adult medical examination without abnormal findings: Secondary | ICD-10-CM

## 2014-10-23 LAB — GLUCOSE, CAPILLARY: GLUCOSE-CAPILLARY: 244 mg/dL — AB (ref 65–99)

## 2014-10-23 MED ORDER — GABAPENTIN 300 MG PO CAPS: 300.0000 mg | ORAL_CAPSULE | Freq: Three times a day (TID) | ORAL | Status: DC

## 2014-10-23 NOTE — Progress Notes (Signed)
Patient ID: Michael [name], male   DOB: [birth year], 59 y.o.   MRN: [id number]    Subjective:   Patient ID: Michael [name] male   DOB: 01-09-1956 59 y.o.   MRN: [id number]  HPI: [name] is a 59 y.o. man with past medical history of Type 2 DM, hypertension, hyperlipidemia, chronic alcoholic pancreatitis, alcohol withdrawal seizures, tobacco use, depression, vitamin D deficiency, and GERD who presents for hospital follow-up visit.   He was hospitalized from 9/11-22 for witnessed seizure in setting of hypoglycemia from unintentional insulin overdose and and also required ICU stay for alcohol withdrawal. His insulin was discontinued and is now only on metformin 1000 mg BID which he reports compliance with. His last A1c was 6.8 on 10/09/14. He checks his blood sugar three times daily but unfortunately did not bring his meter today. He reports average blood sugars in the 100's with low of 73 and high of 244 today. He denies symptomatic hypoglycemia. He has chronic blurry vision, polyuria, polydipsia, and peripheral neuropathy. He would like to try gabapentin. He tries to follow a healthy diet and exercise. He has lost 30 lb since last clinic visit four months ago.   He reports he is no longer drinking alcohol since discharge and denies tremors or further seizure activity.  He has history of chronic pancreatitis with chronic epigastric pain with radiation to the back and recently with decreased appetite and close to 20-30 lb unintentional weight loss. He denies diarrhea. He is compliant with taking creon. His vitamin D level was found to be low and is complaint with taking high dose weekly vitamin D supplementation. He continues to smoke. His last CT abdomen on 08/12/14 revealed pancreatic duct dilation (1 cm) with new calcifications within the pancreas and increased bulkiness of the pancreatic head. MRCP was recommended for further evaluation which he has not received yet. He had prior MRCP in May of 2013  and follow-up was recommended in 6 months.     His HCTZ was discontinued on discharge. He is compliant with taking newly started magnesium supplements.   He would like to have flu shot today.      Past Medical History  Diagnosis Date  . Chronic pancreatitis   . Smoker   . Gastritis due to alcohol without hemorrhage   . HTN (hypertension)   . Pancreatic lesion     appears innocent and related to chronic pancreatitis  . Heavy alcohol use     hx of  . Marijuana use     hx of  . Migraine   . Depression   . Helicobacter pylori gastritis 03/27/2011    on endoscopy, treated with omeprazole, clarithromycin and amoxicillin  . Insomnia   . ACE inhibitor-aggravated angioedema   . Sessile colonic polyp   . Diabetes mellitus   . GERD (gastroesophageal reflux disease)   . Chronic abdominal pain on narcotics 06/27/2012  . Seizures    Current Outpatient Prescriptions  Medication Sig Dispense Refill  . ARIPiprazole (ABILIFY) 15 MG tablet Take 15 mg by mouth at bedtime. Takes with 5 mg and 2 mg for a 22 mg dose    . ARIPiprazole (ABILIFY) 2 MG tablet Take 2 mg by mouth at bedtime. Takes with 15 mg and 5 mg for a 22 mg dose    . ARIPiprazole (ABILIFY) 5 MG tablet Take 5 mg by mouth at bedtime. Takes with 15 mg and 2 mg for a 22 mg dose    . aspirin EC  81 MG tablet Take 81 mg by mouth at bedtime.    . Blood Glucose Monitoring Suppl (ACCU-CHEK NANO SMARTVIEW) W/DEVICE KIT 1 each by Does not apply route 3 (three) times daily. Dx code 250.00 insulin requiring 1 kit 0  . FLUoxetine (PROZAC) 20 MG capsule Take 20 mg by mouth 2 (two) times daily.     Marland Kitchen glucose blood (ACCU-CHEK SMARTVIEW) test strip Check blood sugar 3 times a day before meals and bedtime Dx code E11.65 insulin requiring 100 each 6  . lovastatin (MEVACOR) 40 MG tablet Take 1 tablet (40 mg total) by mouth at bedtime. 30 tablet 2  . magnesium oxide (MAG-OX) 400 (241.3 MG) MG tablet Take 1 tablet (400 mg total) by mouth daily. 30 tablet 3    . meloxicam (MOBIC) 7.5 MG tablet Take 1 tablet (7.5 mg total) by mouth daily. 30 tablet 5  . metFORMIN (GLUCOPHAGE) 500 MG tablet Take 2 tablets (1,000 mg total) by mouth 2 (two) times daily with a meal. 60 tablet 3  . ondansetron (ZOFRAN) 4 MG tablet Take 1 tablet (4 mg total) by mouth every 6 (six) hours. 6 tablet 5  . Pancrelipase, Lip-Prot-Amyl, (CREON) 36000 UNITS CPEP Take 36,000 Units by mouth 3 (three) times daily.    . pantoprazole (PROTONIX) 40 MG tablet Take 1 tablet (40 mg total) by mouth daily. 90 tablet 0  . Vitamin D, Ergocalciferol, (DRISDOL) 50000 UNITS CAPS capsule Take 1 capsule (50,000 Units total) by mouth every 7 (seven) days. Every Thursday 7 capsule 0   No current facility-administered medications for this visit.   Family History  Problem Relation Age of Onset  . Colon cancer Neg Hx   . Stomach cancer Neg Hx   . Anesthesia problems Neg Hx   . Hypotension Neg Hx   . Pseudochol deficiency Neg Hx   . Malignant hyperthermia Neg Hx   . Hypertension Sister   . Diabetes Sister   . Heart disease Sister    Social History   Social History  . Marital Status: Single    Spouse Name: N/A  . Number of Children: 0  . Years of Education: 11   Occupational History  .    [employer]    worked for 6 years  . 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 Smoking status: Current Every Day Smoker -- 0.60 packs/day for 30 years    Types: Cigarettes  . Smokeless tobacco: Never Used  . Alcohol Use: 9.6 oz/week    6 Cans of beer, 10 Standard drinks or equivalent per week  . Drug Use: Yes    Special: Marijuana     Comment: 09/08/2014 "maybe once/month"  . Sexual Activity: Yes   Other Topics Concern  . Not on file   Social History Narrative   Has been living at 3 different friend's homes since discharge 01/2011. Was living in the shelter prior to admission. Had previously lived with his niece and other family  members but he has gotten kicked out each time.    Review of Systems: Review of Systems  Constitutional: Positive for weight loss (unintnetional ) and malaise/fatigue.       Decreased appetite  Gastrointestinal: Positive for heartburn (controlled on medical therapy ), nausea and abdominal pain (chronic epigastric). Negative for diarrhea, constipation, blood in stool and melena.  Genitourinary: Negative for dysuria, urgency, frequency and hematuria.  Chronic polyuria  Musculoskeletal: Positive for back pain (chronic ). Negative for falls.  Neurological: Positive for sensory change (chronic peripheral neuropathy) and headaches. Negative for dizziness, tremors and seizures.  Endo/Heme/Allergies: Positive for polydipsia (chronic).  Psychiatric/Behavioral: Positive for substance abuse (quit alcohol, smokes cigarettes).    Objective:  Physical Exam: Filed Vitals:   10/23/14 1422  BP: 128/79  Pulse: 120  Temp: 98.1 F (36.7 C)  TempSrc: Oral  Weight: 143 lb 6.4 oz (65.046 kg)  SpO2: 100%    Physical Exam  Constitutional: He is oriented to person, place, and time. He appears well-developed and well-nourished. No distress.  HENT:  Head: Normocephalic and atraumatic.  Right Ear: External ear normal.  Left Ear: External ear normal.  Nose: Nose normal.  Mouth/Throat: No oropharyngeal exudate.  Eyes: Conjunctivae and EOM are normal. Pupils are equal, round, and reactive to light. Right eye exhibits no discharge. Left eye exhibits no discharge. No scleral icterus.  Neck: Normal range of motion. Neck supple.  Cardiovascular: Regular rhythm and normal heart sounds.   No murmur heard. Tachycardic   Pulmonary/Chest: Effort normal and breath sounds normal. No respiratory distress. He has no wheezes. He has no rales.  Abdominal: Soft. Bowel sounds are normal. He exhibits no distension. There is tenderness (mild epigastric ). There is no rebound and no guarding.  Musculoskeletal: Normal  range of motion. He exhibits no edema or tenderness.  Neurological: He is alert and oriented to person, place, and time.  No tremor  Skin: Skin is warm and dry. No rash noted. He is not diaphoretic. No erythema. No pallor.  Psychiatric: He has a normal mood and affect. His behavior is normal. Judgment and thought content normal.    Assessment & Plan:   Please see problem list for problem-based assessment and plan

## 2014-10-23 NOTE — Patient Instructions (Signed)
-Great job on quitting alcohol!! -Will refer you to the foot doctor -Will call you to schedule a MRI of your pancreas -Start taking gabapentin 1 pill the first day, 1 pill twice a day the second day, and then 1 pill three times daily after that -Will give you a flu shot today -Please follow-up the diet below -Please come back in 3 months, very nice seeing you!   Low-Fat Diet for Pancreatitis or Gallbladder Conditions A low-fat diet can be helpful if you have pancreatitis or a gallbladder condition. With these conditions, your pancreas and gallbladder have trouble digesting fats. A healthy eating plan with less fat will help rest your pancreas and gallbladder and reduce your symptoms. WHAT DO I NEED TO KNOW ABOUT THIS DIET?  Eat a low-fat diet.  Reduce your fat intake to less than 20-30% of your total daily calories. This is less than 50-60 g of fat per day.  Remember that you need some fat in your diet. Ask your dietician what your daily goal should be.  Choose nonfat and low-fat healthy foods. Look for the words "nonfat," "low fat," or "fat free."  As a guide, look on the label and choose foods with less than 3 g of fat per serving. Eat only one serving.  Avoid alcohol.  Do not smoke. If you need help quitting, talk with your health care provider.  Eat small frequent meals instead of three large heavy meals. WHAT FOODS CAN I EAT? Grains Include healthy grains and starches such as potatoes, wheat bread, fiber-rich cereal, and Streng rice. Choose whole grain options whenever possible. In adults, whole grains should account for 45-65% of your daily calories.  Fruits and Vegetables Eat plenty of fruits and vegetables. Fresh fruits and vegetables add fiber to your diet. Meats and Other Protein Sources Eat lean meat such as chicken and pork. Trim any fat off of meat before cooking it. Eggs, fish, and beans are other sources of protein. In adults, these foods should account for 10-35% of  your daily calories. Dairy Choose low-fat milk and dairy options. Dairy includes fat and protein, as well as calcium.  Fats and Oils Limit high-fat foods such as fried foods, sweets, baked goods, sugary drinks.  Other Creamy sauces and condiments, such as mayonnaise, can add extra fat. Think about whether or not you need to use them, or use smaller amounts or low fat options. WHAT FOODS ARE NOT RECOMMENDED?  High fat foods, such as:  [employer].  Ice cream.  [place] toast.  Sweet rolls.  Pizza.  Cheese bread.  Foods covered with batter, butter, creamy sauces, or cheese.  Fried foods.  Sugary drinks and desserts.  Foods that cause gas or bloating Document Released: 01/14/2013 Document Reviewed: 01/14/2013 [facility] Patient Information 2015 [place], [place]. This information is not intended to replace advice given to you by your health care provider. Make sure you discuss any questions you have with your health care provider.  General Instructions:   Thank you for bringing your medicines today. This helps [place] keep you safe from mistakes.   Progress Toward Treatment Goals:  Treatment Goal 08/12/2012  Hemoglobin A1C at goal  Blood pressure improved  Stop smoking -  Prevent falls -    Self Care Goals & Plans:  Self Care Goal 06/12/2014  Manage my medications take my medicines as prescribed; bring my medications to every visit; refill my medications on time; follow the sick day instructions if I am sick  Monitor my health  keep track of my blood glucose; keep track of my weight; check my feet daily  Eat healthy foods eat more vegetables; eat fruit for snacks and desserts; eat baked foods instead of fried foods; eat smaller portions; eat foods that are low in salt; drink diet soda or water instead of juice or soda  Be physically active find an activity I enjoy  Stop smoking -    Home Blood Glucose Monitoring 09/03/2012  Check my blood sugar 4 times a day  When to check  my blood sugar -     Care Management & Community Referrals:  Referral 02/03/2013  Referrals made to [facility]; other (see comments)

## 2014-10-24 ENCOUNTER — Encounter: Payer: Self-pay | Admitting: Internal Medicine

## 2014-10-24 NOTE — Assessment & Plan Note (Signed)
-  Pt received annual influenza vaccination today on 10/23/14  -Pt with Hepatitis B IgM core Ab positive on 10/11/14 with negative Hep B surface Ag most likely in window period, repeat at next visit

## 2014-10-24 NOTE — Progress Notes (Signed)
Medicine attending: Medical history, presenting problems, physical findings, and medications, reviewed with Dr Marjan Rabbani on the day of the patient visit and I concur with her evaluation and management plan. 

## 2014-10-24 NOTE — Assessment & Plan Note (Signed)
Assessment: Pt with chronic alcoholic pancreatitis compliant with replacement therapy with last CT abdomen on 08/12/14 with new pancreatic calcifications and increased bulkiness of pancreatic head who presents with chronic pain and unintentional weight loss.   Plan:  -Pt given information regarding low-fat pancreatic diet -Pt counseled on smoking and alcohol cessaiton -Continue creon 36K U TID  -Pt instructed to use OTC ibuprofen PRN pain   -Prescribe gabapentin 300 mg TID in setting of chronic peripheral neuropathy -If pain persists, will prescribe tramadol at next visit   -Order placed for MRCP abdomen w and w/o contrast to further investigate pancreatic head bulkiness

## 2014-10-24 NOTE — Assessment & Plan Note (Addendum)
Assessment: Pt with 25-OH vitamin D level of 7.8 on 10/11/14 in setting of pancreatic insufficiency compliant with newly started vitamin D supplementation who presents with no recent fall or fracture.   Plan:  -Continue ergocalciferol 50 K U weekly for 8 weeks -Repeat 25-OH vitamin D level after completion of therapy

## 2014-10-24 NOTE — Assessment & Plan Note (Signed)
Assessment: Pt with well-controlled hypertension recently taken of HCTZ who presents with normal blood pressure of 121/79.    Plan: -BP 121/79 at goal <140/90  -Pt no longer on anti-hypertensive therapy, continue to monitor

## 2014-10-24 NOTE — Assessment & Plan Note (Addendum)
Assessment: Pt with last A1c 6.8 on 10/09/14 with recent hospitalization for unintentional overdose compliant with oral hypoglycemic therapy with no symptomatic hypoglycemia who presents with CBG of 244.   Plan: -A1c 6.8 at goal <7, continue metformin 1000 mg BID -BP 121/79 at goal <140/90 no longer on anti-hypertensive therapy -LDL 78 at goal <100, continue lovastatin 40 mg daily   -Prescribe gabapentin 300 mg TID for chronic peripheral neuropathy  -Last annual eye exam on 01/01/14 with no retinopathy  -Perform annual foot exam today, refer to podiatry for nail care  -Last annual urine microalbumin test on 01/19/14 with no proteinuria  -BMI 24.6 at goal <25, continue lifestyle modification

## 2014-10-30 ENCOUNTER — Telehealth: Payer: Self-pay | Admitting: Internal Medicine

## 2014-10-30 MED ORDER — IBUPROFEN 800 MG PO TABS: 800.0000 mg | ORAL_TABLET | Freq: Three times a day (TID) | ORAL | Status: DC | PRN

## 2014-10-30 NOTE — Telephone Encounter (Signed)
Yes just sent it to his pharmacy, thanks!  Dr. Naaman Plummer

## 2014-10-30 NOTE — Telephone Encounter (Signed)
Patient requesting 800mg  Ibuprofen

## 2014-10-30 NOTE — Telephone Encounter (Signed)
Pt wants 800mg  ibuprofen, do not see on medlist

## 2014-10-30 NOTE — Addendum Note (Signed)
Addended byJuluis Mire on: 10/30/2014 06:04 PM   Modules accepted: Orders

## 2014-11-06 ENCOUNTER — Telehealth: Payer: Self-pay | Admitting: Licensed Clinical Social Worker

## 2014-11-06 NOTE — Telephone Encounter (Signed)
Mr. Besecker placed call to CSW.  Pt states [facility] informed him of need to see PCP sooner than 3 months due to previous seizure activity.  Pt inquiring if he should be seen sooner.  Mr. Owczarzak aware CSW will route this note to PCP.

## 2014-11-08 NOTE — Telephone Encounter (Signed)
He is to have MRCP imaging the end of this month. His seizures were due to combination of alcohol and hypoglycemia from unintentional insulin overdose which he has since quit alcohol and is no longer on insulin. Unless he is having problems, I think December should be fine.   Dr. Naaman Plummer

## 2014-11-09 NOTE — Telephone Encounter (Signed)
Thank you.  I will notify Mr. Siler.

## 2014-11-10 NOTE — Telephone Encounter (Signed)
CSW placed called to pt.  CSW left message stating PCP confirms December appointment is appropriate.  CSW provided contact hours and phone number.

## 2014-11-11 ENCOUNTER — Telehealth: Payer: Self-pay | Admitting: Licensed Clinical Social Worker

## 2014-11-11 NOTE — Telephone Encounter (Signed)
Spoke with patient.  He is not getting pain relief from OTC medications for arthritis, he reports being unable to sleep. Appointment scheduled for 10/26

## 2014-11-11 NOTE — Telephone Encounter (Signed)
Michael [name] placed call to CSW.  CSW unable to obtain specific information pt left in voicemail.  Pt states "....medication needs to be stronger.  I guess I need an appointment....arthritis has flared up..hurts like hell."  Pt requesting return call.  CSW will route this message to triage.

## 2014-11-16 ENCOUNTER — Ambulatory Visit: Payer: Medicaid Other | Admitting: Podiatry

## 2014-11-17 ENCOUNTER — Observation Stay (HOSPITAL_COMMUNITY): Payer: Medicaid Other

## 2014-11-17 ENCOUNTER — Encounter (HOSPITAL_COMMUNITY): Payer: Self-pay | Admitting: General Practice

## 2014-11-17 ENCOUNTER — Observation Stay (HOSPITAL_COMMUNITY)
Admission: AD | Admit: 2014-11-17 | Discharge: 2014-11-19 | Disposition: A | Discharge status: Home Health | Payer: Medicaid Other | Source: Ambulatory Visit | Attending: Student in an Organized Health Care Education/Training Program | Admitting: Student in an Organized Health Care Education/Training Program

## 2014-11-17 ENCOUNTER — Ambulatory Visit (INDEPENDENT_AMBULATORY_CARE_PROVIDER_SITE_OTHER): Payer: Medicaid Other | Admitting: Internal Medicine

## 2014-11-17 VITALS — BP 120/80 | HR 116 | Temp 98.3°F | Ht 64.5 in | Wt 132.9 lb

## 2014-11-17 DIAGNOSIS — E785 Hyperlipidemia, unspecified: Secondary | ICD-10-CM | POA: Diagnosis not present

## 2014-11-17 DIAGNOSIS — E1142 Type 2 diabetes mellitus with diabetic polyneuropathy: Secondary | ICD-10-CM | POA: Diagnosis not present

## 2014-11-17 DIAGNOSIS — F17210 Nicotine dependence, cigarettes, uncomplicated: Secondary | ICD-10-CM | POA: Insufficient documentation

## 2014-11-17 DIAGNOSIS — F329 Major depressive disorder, single episode, unspecified: Secondary | ICD-10-CM | POA: Diagnosis not present

## 2014-11-17 DIAGNOSIS — E43 Unspecified severe protein-calorie malnutrition: Secondary | ICD-10-CM | POA: Insufficient documentation

## 2014-11-17 DIAGNOSIS — Z6822 Body mass index (BMI) 22.0-22.9, adult: Secondary | ICD-10-CM | POA: Insufficient documentation

## 2014-11-17 DIAGNOSIS — Z7982 Long term (current) use of aspirin: Secondary | ICD-10-CM | POA: Insufficient documentation

## 2014-11-17 DIAGNOSIS — R262 Difficulty in walking, not elsewhere classified: Secondary | ICD-10-CM | POA: Insufficient documentation

## 2014-11-17 DIAGNOSIS — I10 Essential (primary) hypertension: Secondary | ICD-10-CM | POA: Diagnosis not present

## 2014-11-17 DIAGNOSIS — K861 Other chronic pancreatitis: Secondary | ICD-10-CM

## 2014-11-17 DIAGNOSIS — F319 Bipolar disorder, unspecified: Secondary | ICD-10-CM | POA: Insufficient documentation

## 2014-11-17 DIAGNOSIS — K860 Alcohol-induced chronic pancreatitis: Principal | ICD-10-CM | POA: Insufficient documentation

## 2014-11-17 DIAGNOSIS — Z794 Long term (current) use of insulin: Secondary | ICD-10-CM | POA: Insufficient documentation

## 2014-11-17 DIAGNOSIS — G894 Chronic pain syndrome: Secondary | ICD-10-CM | POA: Insufficient documentation

## 2014-11-17 DIAGNOSIS — R100 Acute abdomen: Secondary | ICD-10-CM

## 2014-11-17 DIAGNOSIS — K859 Acute pancreatitis without necrosis or infection, unspecified: Secondary | ICD-10-CM

## 2014-11-17 DIAGNOSIS — R109 Unspecified abdominal pain: Secondary | ICD-10-CM

## 2014-11-17 HISTORY — DX: Bipolar disorder, unspecified: F31.9

## 2014-11-17 HISTORY — DX: Type 2 diabetes mellitus without complications: E11.9

## 2014-11-17 LAB — LIPASE, BLOOD: Lipase: 54 U/L — ABNORMAL HIGH (ref 11–51)

## 2014-11-17 LAB — MAGNESIUM: Magnesium: 1.9 mg/dL (ref 1.7–2.4)

## 2014-11-17 LAB — COMPREHENSIVE METABOLIC PANEL
ALBUMIN: 3.5 g/dL (ref 3.5–5.0)
ALK PHOS: 138 U/L — AB (ref 38–126)
ALT: 19 U/L (ref 17–63)
AST: 16 U/L (ref 15–41)
Anion gap: 17 — ABNORMAL HIGH (ref 5–15)
BILIRUBIN TOTAL: 1 mg/dL (ref 0.3–1.2)
BUN: 8 mg/dL (ref 6–20)
CALCIUM: 9.7 mg/dL (ref 8.9–10.3)
CO2: 23 mmol/L (ref 22–32)
CREATININE: 0.82 mg/dL (ref 0.61–1.24)
Chloride: 93 mmol/L — ABNORMAL LOW (ref 101–111)
GFR calc Af Amer: >60 mL/min (ref >60)
GFR calc non Af Amer: >60 mL/min (ref >60)
GLUCOSE: 322 mg/dL — AB (ref 65–99)
Potassium: 3.7 mmol/L (ref 3.5–5.1)
SODIUM: 133 mmol/L — AB (ref 135–145)
TOTAL PROTEIN: 7.2 g/dL (ref 6.5–8.1)

## 2014-11-17 LAB — CBC WITH DIFFERENTIAL/PLATELET
BASOS ABS: 0 10*3/uL (ref 0.0–0.1)
Basophils Relative: 0 %
EOS PCT: 1 %
Eosinophils Absolute: 0.1 10*3/uL (ref 0.0–0.7)
HEMATOCRIT: 48.6 % (ref 39.0–52.0)
Hemoglobin: 17.8 g/dL — ABNORMAL HIGH (ref 13.0–17.0)
LYMPHS ABS: 2.3 10*3/uL (ref 0.7–4.0)
LYMPHS PCT: 13 %
MCH: 33.8 pg (ref 26.0–34.0)
MCHC: 36.6 g/dL — AB (ref 30.0–36.0)
MCV: 92.4 fL (ref 78.0–100.0)
MONO ABS: 1.1 10*3/uL — AB (ref 0.1–1.0)
MONOS PCT: 7 %
Neutro Abs: 13.7 10*3/uL — ABNORMAL HIGH (ref 1.7–7.7)
Neutrophils Relative %: 80 %
PLATELETS: 231 10*3/uL (ref 150–400)
RBC: 5.26 MIL/uL (ref 4.22–5.81)
RDW: 12.4 % (ref 11.5–15.5)
WBC: 17.3 10*3/uL — AB (ref 4.0–10.5)

## 2014-11-17 LAB — PHOSPHORUS: Phosphorus: 4.1 mg/dL (ref 2.5–4.6)

## 2014-11-17 LAB — GLUCOSE, CAPILLARY
GLUCOSE-CAPILLARY: 327 mg/dL — AB (ref 65–99)
Glucose-Capillary: 393 mg/dL — ABNORMAL HIGH (ref 65–99)
Glucose-Capillary: 274 mg/dL — ABNORMAL HIGH (ref 65–99)

## 2014-11-17 LAB — ETHANOL: Alcohol, Ethyl (B): <5 mg/dL (ref <5)

## 2014-11-17 MED ORDER — MORPHINE SULFATE (PF) 2 MG/ML IV SOLN
1.0000 mg | INTRAVENOUS | Status: DC | PRN
  Administered 2014-11-17 – 2014-11-19 (×3): 1 mg via INTRAVENOUS
  Filled 2014-11-17 (×3): qty 1

## 2014-11-17 MED ORDER — FLUOXETINE HCL 20 MG PO CAPS
20.0000 mg | ORAL_CAPSULE | Freq: Two times a day (BID) | ORAL | Status: DC
  Administered 2014-11-17 – 2014-11-19 (×4): 20 mg via ORAL
  Filled 2014-11-17 (×4): qty 1

## 2014-11-17 MED ORDER — ENOXAPARIN SODIUM 40 MG/0.4ML ~~LOC~~ SOLN
40.0000 mg | SUBCUTANEOUS | Status: DC
  Administered 2014-11-17 – 2014-11-18 (×2): 40 mg via SUBCUTANEOUS
  Filled 2014-11-17 (×2): qty 0.4

## 2014-11-17 MED ORDER — PANTOPRAZOLE SODIUM 40 MG IV SOLR
40.0000 mg | Freq: Two times a day (BID) | INTRAVENOUS | Status: DC
  Administered 2014-11-17 – 2014-11-18 (×2): 40 mg via INTRAVENOUS
  Filled 2014-11-17 (×2): qty 40

## 2014-11-17 MED ORDER — SODIUM CHLORIDE 0.9 % IV SOLN
INTRAVENOUS | Status: DC
  Administered 2014-11-17: 150 mL/h via INTRAVENOUS

## 2014-11-17 MED ORDER — FOLIC ACID 1 MG PO TABS
1.0000 mg | ORAL_TABLET | Freq: Every day | ORAL | Status: DC
  Administered 2014-11-17 – 2014-11-19 (×3): 1 mg via ORAL
  Filled 2014-11-17 (×4): qty 1

## 2014-11-17 MED ORDER — ASPIRIN EC 81 MG PO TBEC
81.0000 mg | DELAYED_RELEASE_TABLET | Freq: Every day | ORAL | Status: DC
  Administered 2014-11-17 – 2014-11-18 (×2): 81 mg via ORAL
  Filled 2014-11-17 (×2): qty 1

## 2014-11-17 MED ORDER — ADULT MULTIVITAMIN W/MINERALS CH
1.0000 | ORAL_TABLET | Freq: Every day | ORAL | Status: DC
  Administered 2014-11-17 – 2014-11-19 (×3): 1 via ORAL
  Filled 2014-11-17 (×4): qty 1

## 2014-11-17 MED ORDER — VITAMIN B-1 100 MG PO TABS
100.0000 mg | ORAL_TABLET | Freq: Every day | ORAL | Status: DC
  Administered 2014-11-17 – 2014-11-19 (×3): 100 mg via ORAL
  Filled 2014-11-17 (×4): qty 1

## 2014-11-17 MED ORDER — THIAMINE HCL 100 MG/ML IJ SOLN: 100.0000 mg | Freq: Every day | INTRAMUSCULAR | Status: DC

## 2014-11-17 MED ORDER — PANCRELIPASE (LIP-PROT-AMYL) 36000-114000 UNITS PO CPEP
36000.0000 [IU] | ORAL_CAPSULE | Freq: Three times a day (TID) | ORAL | Status: DC
  Administered 2014-11-17 – 2014-11-19 (×5): 36000 [IU] via ORAL
  Filled 2014-11-17 (×9): qty 1

## 2014-11-17 MED ORDER — HYDROMORPHONE HCL 1 MG/ML IJ SOLN: 1.0000 mg | INTRAMUSCULAR | Status: DC | PRN

## 2014-11-17 MED ORDER — LORAZEPAM 1 MG PO TABS
1.0000 mg | ORAL_TABLET | Freq: Four times a day (QID) | ORAL | Status: DC | PRN
  Administered 2014-11-17: 1 mg via ORAL
  Filled 2014-11-17: qty 1

## 2014-11-17 MED ORDER — INSULIN ASPART 100 UNIT/ML ~~LOC~~ SOLN: 0.0000 [IU] | Freq: Three times a day (TID) | SUBCUTANEOUS | Status: DC

## 2014-11-17 MED ORDER — LORAZEPAM 2 MG/ML IJ SOLN
1.0000 mg | Freq: Four times a day (QID) | INTRAMUSCULAR | Status: DC | PRN
  Administered 2014-11-18: 1 mg via INTRAVENOUS
  Filled 2014-11-17: qty 1

## 2014-11-17 MED ORDER — GADOBENATE DIMEGLUMINE 529 MG/ML IV SOLN
11.0000 mL | Freq: Once | INTRAVENOUS | Status: AC | PRN
Start: 2014-11-17 — End: 2014-11-17
  Administered 2014-11-17: 11 mL via INTRAVENOUS

## 2014-11-17 MED ORDER — GABAPENTIN 300 MG PO CAPS
300.0000 mg | ORAL_CAPSULE | Freq: Three times a day (TID) | ORAL | Status: DC
  Administered 2014-11-17 – 2014-11-19 (×5): 300 mg via ORAL
  Filled 2014-11-17 (×6): qty 1

## 2014-11-17 NOTE — Assessment & Plan Note (Deleted)
PMHx of chronic pancreatitis with worsening abdominal pain for the past 4-5 days. Pain is located in the epigastric and RUQ regions and radiates to the back (flank area bilaterally). Patient has been vomiting everyday and unable to tolerate po intake. In addition, he reports being constipated and has decreased po intake. As per epic records, patient has lost approx. 30 lbs in the past 2 months. CT of abdomen from 07/2014 showing new pancreatic calcifications and increased bulkiness of pancreatic head. His symptoms, imaging, and unintentional 30 lb weight loss in such a short period of time are concerning for acute on chronic pancreatitis vs possible pancreatic malignancy. Patient reports feeling weak and unsteady. States he fell twice at home in the past 2 weeks. Orthostatic vitals were positive. He is tachycardic (P 120) and has dry mouth, which are concerning for dehydration. He has visible abdominal aortic pulsations which is concerning for a possible leaking aneurysm.  -Admitting team has been contacted and I suggested a stat CT of his abdomen. In addition, IVF, Keep patient NPO. -Labs to be ordered as inpatient - CBC, CMP, Amylase, Lipase -MRCP of his abdomen is scheduled for tomorrow.

## 2014-11-17 NOTE — Progress Notes (Signed)
Patient ID: Michael [name], male   DOB: 1955-12-01, 59 y.o.   MRN: [id number]   Subjective:   Patient ID: Michael [name] male   DOB: 1955/08/16 59 y.o.   MRN: [id number]  HPI: Michael [name] is a 59 y.o.  M with a PMHx of chronic pancreatitis and conditions listed below presenting to the clinic with worsening abdominal pain for the past 4-5 days. As per pt, the pain is located in the epigastric and RUQ regions and radiates to the back (flank area bilaterally). States he has been vomiting everyday and unable to tolerate po intake. Reports feeling weak, unsteady, and states he fell twice at home in the past 2 weeks. Endorses decreased appetite. Also reports being constipated. Patient is concerned about his 30 lb weight loss in the past 2 months. He has a history of alcohol abuse but states his last drink was 1 month ago. Denies any hematemesis, hematochezia, or melena. He is currently taking Creon for his pancreatitis, and as per epic, last saw GI in 2014. During his previous visit, an MRCP of his abdomen was ordered and patient states he is supposed to go for the procedure tomorrow.     Past Medical History  Diagnosis Date  . Chronic pancreatitis   . Smoker   . Gastritis due to alcohol without hemorrhage   . HTN (hypertension)   . Pancreatic lesion     appears innocent and related to chronic pancreatitis  . Heavy alcohol use     hx of  . Marijuana use     hx of  . Migraine   . Depression   . Helicobacter pylori gastritis 03/27/2011    on endoscopy, treated with omeprazole, clarithromycin and amoxicillin  . Insomnia   . ACE inhibitor-aggravated angioedema   . Sessile colonic polyp   . Diabetes mellitus   . GERD (gastroesophageal reflux disease)   . Chronic abdominal pain on narcotics 06/27/2012  . Seizures ([place])    No current facility-administered medications for this visit.   No current outpatient prescriptions on file.   Facility-Administered Medications Ordered in Other Visits    Medication Dose Route Frequency Provider Last Rate Last Dose  . 0.9 %  sodium chloride infusion   Intravenous Continuous Jones Bales, MD      . aspirin EC tablet 81 mg  81 mg Oral QHS Jones Bales, MD      . enoxaparin (LOVENOX) injection 40 mg  40 mg Subcutaneous Q24H Jones Bales, MD   40 mg at 11/17/14 1837  . FLUoxetine (PROZAC) capsule 20 mg  20 mg Oral BID Jones Bales, MD      . folic acid (FOLVITE) tablet 1 mg  1 mg Oral Daily Jones Bales, MD      . gabapentin (NEURONTIN) capsule 300 mg  300 mg Oral TID Jones Bales, MD      . Derrill Memo ON 11/18/2014] insulin aspart (novoLOG) injection 0-9 Units  0-9 Units Subcutaneous TID WC Jones Bales, MD      . lipase/protease/amylase (CREON) capsule 36,000 Units  36,000 Units Oral TID Jones Bales, MD      . LORazepam (ATIVAN) tablet 1 mg  1 mg Oral Q6H PRN Jones Bales, MD       Or  . LORazepam (ATIVAN) injection 1 mg  1 mg Intravenous Q6H PRN Jones Bales, MD      . morphine 2 MG/ML injection 1 mg  1 mg  Intravenous Q3H PRN Jones Bales, MD      . multivitamin with minerals tablet 1 tablet  1 tablet Oral Daily Jones Bales, MD      . pantoprazole (PROTONIX) injection 40 mg  40 mg Intravenous Q12H Jones Bales, MD      . thiamine (VITAMIN B-1) tablet 100 mg  100 mg Oral Daily Jones Bales, MD       Or  . thiamine (B-1) injection 100 mg  100 mg Intravenous Daily Jones Bales, MD       Family History  Problem Relation Age of Onset  . Colon cancer Neg Hx   . Stomach cancer Neg Hx   . Anesthesia problems Neg Hx   . Hypotension Neg Hx   . Pseudochol deficiency Neg Hx   . Malignant hyperthermia Neg Hx   . Hypertension Sister   . Diabetes Sister   . Heart disease Sister    Social History   Social History  . Marital Status: Single    Spouse Name: N/A  . Number of Children: 0  . Years of Education: 11   Occupational History  .    [employer]    worked for 6 years  . 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 Smoking status: Current Every Day Smoker -- 0.60 packs/day for 30 years    Types: Cigarettes  . Smokeless tobacco: Never Used  . Alcohol Use: 9.6 oz/week    6 Cans of beer, 10 Standard drinks or equivalent per week  . Drug Use: Yes    Special: Marijuana     Comment: 09/08/2014 "maybe once/month"  . Sexual Activity: Yes   Other Topics Concern  . Not on file   Social History Narrative   Has been living at 3 different friend's homes since discharge 01/2011. Was living in the shelter prior to admission. Had previously lived with his niece and other family members but he has gotten kicked out each time.    Review of Systems: Review of Systems  Constitutional: Positive for weight loss. Negative for fever and chills.  HENT: Negative for ear pain.   Eyes: Negative for blurred vision and pain.  Respiratory: Negative for cough, shortness of breath and wheezing.   Cardiovascular: Negative for chest pain and leg swelling.  Gastrointestinal: Positive for nausea, vomiting, abdominal pain and constipation. Negative for diarrhea, blood in stool and melena.  Genitourinary: Positive for flank pain. Negative for dysuria, urgency, frequency and hematuria.  Musculoskeletal: Positive for back pain. Negative for myalgias.  Skin: Negative for itching and rash.  Neurological: Positive for dizziness and weakness. Negative for sensory change, focal weakness and headaches.       Gait instability    Objective:  Physical Exam: Filed Vitals:   11/17/14 1412 11/17/14 1518 11/17/14 1542 11/17/14 1553  BP: 127/79 140/100 130/80 120/80  Pulse: 120 114 106 116  Temp: 98.3 F (36.8 C)     TempSrc: Oral     Height: 5' 4.5" (1.638 m)     Weight: 132 lb 14.4 oz (60.283 kg)     SpO2: 98%      Physical Exam  Constitutional: He is oriented to person, place, and time.  HENT:  Head: Normocephalic and atraumatic.  Tongue  appears dry with a thick white/ yellow coating  Eyes: EOM are normal. Pupils are equal, round, and reactive to  light.  Neck: Neck supple. No tracheal deviation present.  Cardiovascular: Normal rate, regular rhythm and intact distal pulses.   Tachycardic  Visible abdominal aortic pulsations  Pulmonary/Chest: Effort normal. No respiratory distress. He has no wheezes. He has no rales.  Abdominal: Soft. Bowel sounds are normal. He exhibits no distension. There is tenderness. There is no rebound and no guarding.  Tenderness on palpation of epigastrium and RUQ.   Genitourinary:  +CVA tenderness bilaterally   Musculoskeletal: He exhibits edema.  Neurological: He is alert and oriented to person, place, and time.  Skin: Skin is warm and dry. He is not diaphoretic.   Assessment & Plan:

## 2014-11-17 NOTE — H&P (Signed)
Date: 11/17/2014               Patient Name:  Michael [name] MRN: [id number]  DOB: 04-23-1955 Age / Sex: 59 y.o., male   PCP: Juluis Mire, MD         Medical Service: Internal Medicine Teaching Service         Attending Physician: Dr. Axel Filler, MD    First Contact: Dr. Lovena Le Pager: [phone]  Second Contact: Dr. Gordy Levan Pager: [phone]       After Hours (After 5p/  First Contact Pager: [phone]  weekends / holidays): Second Contact Pager: [phone]   Chief Complaint: abdominal pain  History of Present Illness: Michael [name] is a is a 59 y.o. man with past medical history of Type 2 DM, hypertension, hyperlipidemia, chronic alcoholic pancreatitis, alcohol withdrawal seizures, tobacco use, depression, vitamin D deficiency, and GERD.  He is a direct admit from clinic where he was complaining of worsened abdominal pain, 30 lb weight loss over the last month, and positive orthostatics.  He was diagnosed with chronic alcoholic pancreatitis in 7124.  He has had abdominal pain radiating to the back since that time.  He states the pain is worsening, especially with food intake.  He has had decreased PO intake over the last few weeks.  He has only eaten a can of soup over the last 3 days.  He tried to eat a chicken wing today and vomited.  He has not been taking his Creon.  He endorses feeling lightheaded and dizzy when standing.    He denies fever, chills, cough, congestion, diarrhea, dysuria, or leg swelling.  He endorses constipation, with a BM every 3 days.  He has a h/o GERD for which he does not take any medications.  He has a h/o DMII for which he is only on metformin.  He was previously on Insulin, which was stopped last month because the patient was taking too much insulin and provoked a hypoglycemic seizure.    He still smokes 2-3 cigarettes per day.  He denies alcohol use, with his last drink in August.  CT abdomen in July revealed dilatation of the pancreatic duct to 1.0 cm, with  new diffuse calcification throughout the pancreas, likely reflecting sequelae of chronic pancreatitis. The pancreatic head is somewhat more bulky than on the prior study, thought to reflect calcification. An underlying mass cannot be excluded, but is not definitely seen. MRCP was recommended, and scheduled for 10/28.  Meds: Current Facility-Administered Medications  Medication Dose Route Frequency Provider Last Rate Last Dose  . 0.9 %  sodium chloride infusion   Intravenous Continuous Jones Bales, MD      . aspirin EC tablet 81 mg  81 mg Oral QHS Jones Bales, MD      . enoxaparin (LOVENOX) injection 40 mg  40 mg Subcutaneous Q24H Jones Bales, MD      . FLUoxetine (PROZAC) capsule 20 mg  20 mg Oral BID Jones Bales, MD      . folic acid (FOLVITE) tablet 1 mg  1 mg Oral Daily Jones Bales, MD      . gabapentin (NEURONTIN) capsule 300 mg  300 mg Oral TID Jones Bales, MD      . HYDROmorphone (DILAUDID) injection 1 mg  1 mg Intravenous Q4H PRN Jones Bales, MD      . Derrill Memo ON 11/18/2014] insulin aspart (novoLOG) injection 0-9 Units  0-9 Units Subcutaneous TID WC [name], MD      . lipase/protease/amylase (CREON) capsule 36,000 Units  36,000 Units Oral TID Jones Bales, MD      . LORazepam (ATIVAN) tablet 1 mg  1 mg Oral Q6H PRN Jones Bales, MD       Or  . LORazepam (ATIVAN) injection 1 mg  1 mg Intravenous Q6H PRN Jones Bales, MD      . multivitamin with minerals tablet 1 tablet  1 tablet Oral Daily Jones Bales, MD      . pantoprazole (PROTONIX) injection 40 mg  40 mg Intravenous Q12H Jones Bales, MD      . thiamine (VITAMIN B-1) tablet 100 mg  100 mg Oral Daily Jones Bales, MD       Or  . thiamine (B-1) injection 100 mg  100 mg Intravenous Daily Jones Bales, MD        Allergies: Allergies as of 11/17/2014 - Review Complete 11/17/2014  Allergen Reaction Noted  . Ace inhibitors Swelling 02/02/2011  . Losartan Swelling  02/03/2011   Past Medical History  Diagnosis Date  . Chronic pancreatitis   . Smoker   . Gastritis due to alcohol without hemorrhage   . HTN (hypertension)   . Pancreatic lesion     appears innocent and related to chronic pancreatitis  . Heavy alcohol use     hx of  . Marijuana use     hx of  . Migraine   . Depression   . Helicobacter pylori gastritis 03/27/2011    on endoscopy, treated with omeprazole, clarithromycin and amoxicillin  . Insomnia   . ACE inhibitor-aggravated angioedema   . Sessile colonic polyp   . Diabetes mellitus   . GERD (gastroesophageal reflux disease)   . Chronic abdominal pain on narcotics 06/27/2012  . Seizures [facility])    Past Surgical History  Procedure Laterality Date  . Esophagogastroduodenoscopy  03/22/2011    Procedure: ESOPHAGOGASTRODUODENOSCOPY (EGD);  Surgeon: Gatha Mayer, MD;  Location: [facility];  Service: Endoscopy;  Laterality: N/A;  egd with balloon   . Balloon dilation  03/22/2011    Procedure: BALLOON DILATION;  Surgeon: Gatha Mayer, MD;  Location: [facility];  Service: Endoscopy;  Laterality: N/A;  . Eus  04/20/2011    Procedure: UPPER ENDOSCOPIC ULTRASOUND (EUS) LINEAR;  Surgeon: Milus Banister, MD;  Location: [facility];  Service: Endoscopy;  Laterality: N/A;  . Colonoscopy w/ biopsies and polypectomy  09/12/11  . Colonoscopy N/A 05/22/2012    Procedure: COLONOSCOPY;  Surgeon: Gatha Mayer, MD;  Location: [facility];  Service: Endoscopy;  Laterality: N/A;   Family History  Problem Relation Age of Onset  . Colon cancer Neg Hx   . Stomach cancer Neg Hx   . Anesthesia problems Neg Hx   . Hypotension Neg Hx   . Pseudochol deficiency Neg Hx   . Malignant hyperthermia Neg Hx   . Hypertension Sister   . Diabetes Sister   . Heart disease Sister    Social History   Social History  . Marital Status: Single    Spouse Name: N/A  . Number of Children: 0  . Years of Education: 11   Occupational History  .    [employer]    worked for 6 years  . 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 Smoking status: Current Every Day  Smoker -- 0.60 packs/day for 30 years    Types: Cigarettes  . Smokeless tobacco: Never Used  . Alcohol Use: 9.6 oz/week    6 Cans of beer, 10 Standard drinks or equivalent per week  . Drug Use: Yes    Special: Marijuana     Comment: 09/08/2014 "maybe once/month"  . Sexual Activity: Yes   Other Topics Concern  . Not on file   Social History Narrative   Has been living at 3 different friend's homes since discharge 01/2011. Was living in the shelter prior to admission. Had previously lived with his niece and other family members but he has gotten kicked out each time.     Review of Systems: Pertinent items are noted in HPI.  Physical Exam: Blood pressure 134/96, pulse 112, temperature 98.5 F (36.9 C), temperature source Oral, resp. rate 20, height 5\' 4"  (1.626 m), weight 132 lb (59.875 kg), SpO2 100 %. Physical Exam  Constitutional: He is oriented to person, place, and time and well-developed, well-nourished, and in no distress.  HENT:  Head: Normocephalic and atraumatic.  Eyes: EOM are normal. No scleral icterus.  Neck: No JVD present.  Cardiovascular: Normal rate, regular rhythm, normal heart sounds and intact distal pulses.   Pulmonary/Chest: Effort normal and breath sounds normal. No respiratory distress. He has no wheezes.  Abdominal: Soft. He exhibits no distension. There is no tenderness. There is no rebound and no guarding.  Musculoskeletal: He exhibits no edema.  Neurological: He is alert and oriented to person, place, and time.  Skin: Skin is warm and dry. No rash noted.     Lab results: Basic Metabolic Panel: No results for input(s): NA, K, CL, CO2, GLUCOSE, BUN, CREATININE, CALCIUM, MG, PHOS in the last 72 hours. Liver Function Tests: No results for input(s): AST, ALT, ALKPHOS, BILITOT, PROT, ALBUMIN  in the last 72 hours. No results for input(s): LIPASE, AMYLASE in the last 72 hours. No results for input(s): AMMONIA in the last 72 hours. CBC: No results for input(s): WBC, NEUTROABS, HGB, HCT, MCV, PLT in the last 72 hours. Cardiac Enzymes: No results for input(s): CKTOTAL, CKMB, CKMBINDEX, TROPONINI in the last 72 hours. BNP: No results for input(s): PROBNP in the last 72 hours. D-Dimer: No results for input(s): DDIMER in the last 72 hours. CBG:  Recent Labs  11/17/14 1619 11/17/14 1740  GLUCAP 393* 327*   Hemoglobin A1C: No results for input(s): HGBA1C in the last 72 hours. Fasting Lipid Panel: No results for input(s): CHOL, HDL, LDLCALC, TRIG, CHOLHDL, LDLDIRECT in the last 72 hours. Thyroid Function Tests: No results for input(s): TSH, T4TOTAL, FREET4, T3FREE, THYROIDAB in the last 72 hours. Anemia Panel: No results for input(s): VITAMINB12, FOLATE, FERRITIN, TIBC, IRON, RETICCTPCT in the last 72 hours. Coagulation: No results for input(s): LABPROT, INR in the last 72 hours. Urine Drug Screen: Drugs of Abuse     Component Value Date/Time   LABOPIA NONE DETECTED 10/09/2014 0210   COCAINSCRNUR NONE DETECTED 10/09/2014 0210   COCAINSCRNUR NEG 07/05/2012 0946   LABBENZ POSITIVE* 10/09/2014 0210   LABBENZ NEG 07/05/2012 0946   AMPHETMU NONE DETECTED 10/09/2014 0210   AMPHETMU NEG 07/05/2012 0946   THCU NONE DETECTED 10/09/2014 0210   LABBARB NONE DETECTED 10/09/2014 0210    Alcohol Level: No results for input(s): ETH in the last 72 hours. Urinalysis: No results for input(s): COLORURINE, LABSPEC, PHURINE, GLUCOSEU, HGBUR, BILIRUBINUR, KETONESUR, PROTEINUR, UROBILINOGEN, NITRITE, LEUKOCYTESUR in the last 72 hours.  Invalid input(s): APPERANCEUR Misc. Labs:  Imaging results:  No results found.  Other results: EKG:   Assessment & Plan by Problem: Active Problems:   Type 2 diabetes, controlled, with neuropathy (HCC)   Chronic pain syndrome   Bipolar  disorder (HCC)   Chronic pancreatitis ([place])  Chronic Pancreatitis: Patient with chronic alcoholic pancreatitis presenting with continued abdominal pain, decreased PO intake, and 30 lb weight loss.  Symptoms likely 2/2 continued decline in pancreatic function, especially since the patient has not been taking Creon.  Patient appears remarkably stable to be concerned for pseudocyst formation.  As patient has vomiting with food and previous CT demonstrated pancreatic duct dilation and increased bulkiness of pancreatic head, will continue for MRCP to r/o bile duct obstruction.  MRI will also evaluate for pancreatic cancer.  Patients early satiety/N/V may also be 2/2 gastroparesis given his longstanding diabetes. [ ]  EtOH [ ]  UDS - NPO pending MRCP - Dilaudid 1 mg IV q4h PRN - Protonix - Creon - NS @ 150 ml/hr [ ]  Orthostatics in AM  DMII with peripheral neuropathy: SSI and Gabapentin Depression: Prozac DVT Ppx: Lovenox  Dispo: Disposition is deferred at this time, awaiting improvement of current medical problems. Anticipated discharge in approximately 1-2 day(s).   The patient does have a current PCP (Juluis Mire, MD) and does need an [facility] hospital follow-up appointment after discharge.  The patient does not have transportation limitations that hinder transportation to clinic appointments.  Signed: Iline Oven, MD 11/17/2014, 6:35 PM

## 2014-11-17 NOTE — Assessment & Plan Note (Signed)
PMHx of chronic pancreatitis with worsening abdominal pain for the past 4-5 days. Pain is located in the epigastric and RUQ regions and radiates to the back (flank area bilaterally). Patient has been vomiting everyday and unable to tolerate po intake. In addition, he reports being constipated and has decreased po intake. As per epic records, patient has lost approx. 30 lbs in the past 2 months. CT of abdomen from 07/2014 showing new pancreatic calcifications and increased bulkiness of pancreatic head. His symptoms, imaging, and unintentional 30 lb weight loss in such a short period of time are concerning for acute on chronic pancreatitis vs possible pancreatic malignancy. Patient reports feeling weak and unsteady. States he fell twice at home in the past 2 weeks. Orthostatic vitals were positive. He is tachycardic (P 120) and has dry mouth, which are concerning for dehydration. He has visible abdominal aortic pulsations which is concerning for a possible leaking aneurysm.  -Admitting team has been contacted and I suggested a stat CT of his abdomen. In addition, IVF, Keep patient NPO. -Labs to be ordered as inpatient - CBC, CMP, Amylase, Lipase -MRCP of his abdomen is scheduled for tomorrow.

## 2014-11-17 NOTE — Progress Notes (Signed)
Report called to [facility] on [address].  Patient transported via wheelchair [address].  Sander Nephew, RN 11/17/2014 4:31 PM

## 2014-11-18 DIAGNOSIS — E1142 Type 2 diabetes mellitus with diabetic polyneuropathy: Secondary | ICD-10-CM

## 2014-11-18 DIAGNOSIS — E872 Acidosis: Secondary | ICD-10-CM

## 2014-11-18 DIAGNOSIS — E43 Unspecified severe protein-calorie malnutrition: Secondary | ICD-10-CM | POA: Insufficient documentation

## 2014-11-18 DIAGNOSIS — K861 Other chronic pancreatitis: Secondary | ICD-10-CM

## 2014-11-18 DIAGNOSIS — F329 Major depressive disorder, single episode, unspecified: Secondary | ICD-10-CM

## 2014-11-18 DIAGNOSIS — R634 Abnormal weight loss: Secondary | ICD-10-CM

## 2014-11-18 DIAGNOSIS — K860 Alcohol-induced chronic pancreatitis: Secondary | ICD-10-CM | POA: Diagnosis not present

## 2014-11-18 DIAGNOSIS — Z794 Long term (current) use of insulin: Secondary | ICD-10-CM

## 2014-11-18 LAB — BASIC METABOLIC PANEL
Anion gap: 14 (ref 5–15)
Anion gap: 13 (ref 5–15)
BUN: 8 mg/dL (ref 6–20)
BUN: 9 mg/dL (ref 6–20)
CALCIUM: 9.1 mg/dL (ref 8.9–10.3)
CHLORIDE: 99 mmol/L — AB (ref 101–111)
CO2: 21 mmol/L — AB (ref 22–32)
CO2: 24 mmol/L (ref 22–32)
CREATININE: 0.7 mg/dL (ref 0.61–1.24)
CREATININE: 0.76 mg/dL (ref 0.61–1.24)
Calcium: 9.2 mg/dL (ref 8.9–10.3)
Chloride: 98 mmol/L — ABNORMAL LOW (ref 101–111)
GFR calc Af Amer: >60 mL/min (ref >60)
GFR calc Af Amer: >60 mL/min (ref >60)
GFR calc non Af Amer: >60 mL/min (ref >60)
GLUCOSE: 426 mg/dL — AB (ref 65–99)
Glucose, Bld: 248 mg/dL — ABNORMAL HIGH (ref 65–99)
Potassium: 3.5 mmol/L (ref 3.5–5.1)
Potassium: 3.5 mmol/L (ref 3.5–5.1)
SODIUM: 134 mmol/L — AB (ref 135–145)
Sodium: 135 mmol/L (ref 135–145)

## 2014-11-18 LAB — URINE MICROSCOPIC-ADD ON

## 2014-11-18 LAB — URINALYSIS, ROUTINE W REFLEX MICROSCOPIC
Glucose, UA: >1000 mg/dL — AB
HGB URINE DIPSTICK: NEGATIVE
Ketones, ur: >80 mg/dL — AB
Leukocytes, UA: NEGATIVE
NITRITE: NEGATIVE
PROTEIN: NEGATIVE mg/dL
Specific Gravity, Urine: 1.037 — ABNORMAL HIGH (ref 1.005–1.030)
UROBILINOGEN UA: 0.2 mg/dL (ref 0.0–1.0)
pH: 5.5 (ref 5.0–8.0)

## 2014-11-18 LAB — RAPID URINE DRUG SCREEN, HOSP PERFORMED
Amphetamines: NOT DETECTED
Barbiturates: NOT DETECTED
Benzodiazepines: POSITIVE — AB
COCAINE: NOT DETECTED
OPIATES: POSITIVE — AB
Tetrahydrocannabinol: NOT DETECTED

## 2014-11-18 LAB — HEMOGLOBIN A1C
Hgb A1c MFr Bld: 11.8 % — ABNORMAL HIGH (ref 4.8–5.6)
MEAN PLASMA GLUCOSE: 292 mg/dL

## 2014-11-18 LAB — GLUCOSE, CAPILLARY
GLUCOSE-CAPILLARY: 116 mg/dL — AB (ref 65–99)
Glucose-Capillary: 250 mg/dL — ABNORMAL HIGH (ref 65–99)
Glucose-Capillary: 275 mg/dL — ABNORMAL HIGH (ref 65–99)
Glucose-Capillary: 270 mg/dL — ABNORMAL HIGH (ref 65–99)

## 2014-11-18 MED ORDER — SODIUM CHLORIDE 0.9 % IV SOLN: INTRAVENOUS | Status: DC

## 2014-11-18 MED ORDER — GLUCERNA SHAKE PO LIQD
237.0000 mL | Freq: Three times a day (TID) | ORAL | Status: DC
  Administered 2014-11-18 – 2014-11-19 (×2): 237 mL via ORAL

## 2014-11-18 MED ORDER — INSULIN DETEMIR 100 UNIT/ML ~~LOC~~ SOLN
10.0000 [IU] | Freq: Every day | SUBCUTANEOUS | Status: DC
  Administered 2014-11-18 – 2014-11-19 (×2): 10 [IU] via SUBCUTANEOUS
  Filled 2014-11-18 (×2): qty 0.1

## 2014-11-18 MED ORDER — INSULIN ASPART 100 UNIT/ML ~~LOC~~ SOLN
0.0000 [IU] | Freq: Three times a day (TID) | SUBCUTANEOUS | Status: DC
Start: 2014-11-18 — End: 2014-11-19
  Administered 2014-11-18 (×2): 8 [IU] via SUBCUTANEOUS
  Administered 2014-11-18: 5 [IU] via SUBCUTANEOUS
  Administered 2014-11-19: 15 [IU] via SUBCUTANEOUS
  Administered 2014-11-19: 2 [IU] via SUBCUTANEOUS

## 2014-11-18 MED ORDER — SODIUM CHLORIDE 0.9 % IV SOLN
INTRAVENOUS | Status: AC
  Administered 2014-11-18: 18:00:00 via INTRAVENOUS

## 2014-11-18 MED ORDER — PANTOPRAZOLE SODIUM 40 MG PO TBEC
40.0000 mg | DELAYED_RELEASE_TABLET | Freq: Every day | ORAL | Status: DC
  Administered 2014-11-19: 40 mg via ORAL
  Filled 2014-11-18: qty 1

## 2014-11-18 MED ORDER — LORAZEPAM 1 MG PO TABS: 1.0000 mg | ORAL_TABLET | Freq: Two times a day (BID) | ORAL | Status: DC

## 2014-11-18 NOTE — Progress Notes (Signed)
Subjective: NAEON.  He is without complaint and slept well.    Objective: Vital signs in last 24 hours: Filed Vitals:   11/17/14 1659 11/17/14 2336 11/18/14 0610  BP: 134/96 122/78 124/74  Pulse: 112 105 96  Temp: 98.5 F (36.9 C) 98.9 F (37.2 C) 98.6 F (37 C)  TempSrc: Oral Oral Oral  Resp: 20 18 18   Height: 5\' 4"  (1.626 m)    Weight: 132 lb (59.875 kg)    SpO2: 100% 100% 96%   Weight change:   Intake/Output Summary (Last 24 hours) at 11/18/14 1113 Last data filed at 11/18/14 0912  Gross per 24 hour  Intake      0 ml  Output    650 ml  Net   -650 ml   Physical Exam  Constitutional: He is oriented to person, place, and time and well-developed, well-nourished, and in no distress. No distress.  HENT:  Head: Normocephalic and atraumatic.  Eyes: EOM are normal.  Neck: No JVD present.  Cardiovascular: Normal rate, regular rhythm, normal heart sounds and intact distal pulses.   Pulmonary/Chest: Effort normal and breath sounds normal. No respiratory distress. He has no wheezes.  Abdominal: Soft. He exhibits no distension. There is no rebound and no guarding.  Moderate TTP in epigastric region.  Scattered, soft, 1-2 mm subcutaneous palpable nodules appreciated.  Musculoskeletal: He exhibits no edema.  Neurological: He is alert and oriented to person, place, and time.  Skin: Skin is warm and dry. No rash noted.    Lab Results: Basic Metabolic Panel:  Recent Labs Lab 11/17/14 1824 11/18/14 0825  NA 133* 134*  K 3.7 3.5  CL 93* 99*  CO2 23 21*  GLUCOSE 322* 248*  BUN 8 8  CREATININE 0.82 0.70  CALCIUM 9.7 9.2  MG 1.9  --   PHOS 4.1  --    Liver Function Tests:  Recent Labs Lab 11/17/14 1824  AST 16  ALT 19  ALKPHOS 138*  BILITOT 1.0  PROT 7.2  ALBUMIN 3.5    Recent Labs Lab 11/17/14 1824  LIPASE 54*   No results for input(s): AMMONIA in the last 168 hours. CBC:  Recent Labs Lab 11/17/14 1824  WBC 17.3*  NEUTROABS 13.7*  HGB 17.8*  HCT  48.6  MCV 92.4  PLT 231   Cardiac Enzymes: No results for input(s): CKTOTAL, CKMB, CKMBINDEX, TROPONINI in the last 168 hours. BNP: No results for input(s): PROBNP in the last 168 hours. D-Dimer: No results for input(s): DDIMER in the last 168 hours. CBG:  Recent Labs Lab 11/17/14 1619 11/17/14 1740 11/17/14 2145 11/18/14 0744  GLUCAP 393* 327* 274* 250*   Hemoglobin A1C:  Recent Labs Lab 11/17/14 1824  HGBA1C 11.8*   Fasting Lipid Panel: No results for input(s): CHOL, HDL, LDLCALC, TRIG, CHOLHDL, LDLDIRECT in the last 168 hours. Thyroid Function Tests: No results for input(s): TSH, T4TOTAL, FREET4, T3FREE, THYROIDAB in the last 168 hours. Coagulation: No results for input(s): LABPROT, INR in the last 168 hours. Anemia Panel: No results for input(s): VITAMINB12, FOLATE, FERRITIN, TIBC, IRON, RETICCTPCT in the last 168 hours. Urine Drug Screen: Drugs of Abuse     Component Value Date/Time   LABOPIA POSITIVE* 11/18/2014 0743   COCAINSCRNUR NONE DETECTED 11/18/2014 0743   COCAINSCRNUR NEG 07/05/2012 0946   LABBENZ POSITIVE* 11/18/2014 0743   LABBENZ NEG 07/05/2012 0946   AMPHETMU NONE DETECTED 11/18/2014 0743   AMPHETMU NEG 07/05/2012 0946   THCU NONE DETECTED 11/18/2014 0743  LABBARB NONE DETECTED 11/18/2014 0743    Alcohol Level:  Recent Labs Lab 11/17/14 1824  ETH <5   Urinalysis:  Recent Labs Lab 11/18/14 0742  COLORURINE YELLOW  LABSPEC 1.037*  PHURINE 5.5  GLUCOSEU >1000*  HGBUR NEGATIVE  BILIRUBINUR SMALL*  KETONESUR >80*  PROTEINUR NEGATIVE  UROBILINOGEN 0.2  NITRITE NEGATIVE  LEUKOCYTESUR NEGATIVE   Misc. Labs:   Micro Results: Recent Results (from the past 240 hour(s))  Culture, blood (routine x 2)     Status: None (Preliminary result)   Collection Time: 11/18/14  8:25 AM  Result Value Ref Range Status   Specimen Description BLOOD RIGHT HAND  Final   Special Requests BOTTLES DRAWN AEROBIC AND ANAEROBIC 10CC  Final   Culture  PENDING  Incomplete   Report Status PENDING  Incomplete  Culture, blood (routine x 2)     Status: None (Preliminary result)   Collection Time: 11/18/14  8:29 AM  Result Value Ref Range Status   Specimen Description BLOOD LEFT HAND  Final   Special Requests BOTTLES DRAWN AEROBIC AND ANAEROBIC 10CC  Final   Culture PENDING  Incomplete   Report Status PENDING  Incomplete   Studies/Results: Mr 3d Recon At Scanner  11/18/2014  CLINICAL DATA:  59 year old male with history of chronic pancreatitis, subsequent encounter. Worsening abdominal pain for the last 4 5 days localizing the epigastric and right upper quadrant region and radiating to the bilateral flank area. Vomiting and unable to tolerate p.o. intake. EXAM: MRI ABDOMEN WITHOUT AND WITH CONTRAST (INCLUDING MRCP) TECHNIQUE: Multiplanar multisequence MR imaging of the abdomen was performed both before and after the administration of intravenous contrast. Heavily T2-weighted images of the biliary and pancreatic ducts were obtained, and three-dimensional MRCP images were rendered by post processing. CONTRAST:  5mL MULTIHANCE GADOBENATE DIMEGLUMINE 529 MG/ML IV SOLN COMPARISON:  CT scan from 08/12/2014.  MRI from 06/16/2011. FINDINGS: Lower chest:  Unremarkable. Hepatobiliary: Assessment of liver parenchyma degraded by breathing motion. No gross mass lesion evident within the liver. There is mild intrahepatic biliary duct dilatation. Gallbladder is prominently distended and there is are layering tiny stones towards the gallbladder neck. Extrahepatic common duct measures 7 mm in diameter. Pancreas: Pancreas is diffusely atrophic with fullness again noted in the region of the pancreatic head. The extensive pancreatic parenchymal calcifications seen on previous CT is not readily evident on MRI given signal void within the calcium deposits. The main pancreatic duct is diffusely dilated, most prominently in the pancreatic head region right measures up to 7 mm  diameter. Clustered tiny cysts are noted in the head of the pancreas and uncinate process. Although fine detail is completely obscured by the patient motion, no gross hypervascular lesion is seen in the pancreas. Heterogeneous perfusion is noted in the region of the pancreatic head and uncinate process which could simply be related to the cystic change and chronic pancreatitis. However, these features could obscure a hypovascular lesion in the pancreatic head. In general, the appearance does not appear substantially changed since 08/12/2014 CT scan. Spleen: No splenomegaly. No focal mass lesion. Adrenals/Urinary Tract: Stable small bilateral adrenal gland adenomas. No hydronephrosis in either kidney. No definite enhancing lesion in either kidney although fine detail obscured by motion artifact. Stomach/Bowel: Stomach is nondistended. No gastric wall thickening. No evidence of outlet obstruction. Duodenum is normally positioned as is the ligament of Treitz. No evidence for small bowel obstruction within the visualized abdomen. Vascular/Lymphatic: There is no gastrohepatic or hepatoduodenal ligament lymphadenopathy. No intraperitoneal or retroperitoneal lymphadenopy.  No abdominal aortic aneurysm. Other: No intraperitoneal free fluid. Musculoskeletal: No abnormal marrow signal within the visualized bony anatomy. IMPRESSION: 1. Study is markedly motion degraded. As seen on multiple prior studies, there is diffuse atrophy of the pancreatic parenchyma with associated diffuse dilatation of the main pancreatic duct. Dystrophic calcification throughout the pancreas is less well demonstrated by MR than CT. Fullness in the region the pancreatic head is stable in appearance when comparing to CT scan of 08/12/2014. No hypervascular lesion identified in the pancreas. No discrete hypovascular mass lesion is evident although changes in the head of the pancreas and associated motion artifact significantly hinder assessment. CT or MR  follow-up could be used to ensure continued stability. 2. Gallbladder distention with cholelithiasis. 3. Stable bilateral adrenal adenomas. Electronically Signed   By: Misty Stanley M.D.   On: 11/18/2014 08:34   Mr Jeananne Rama W/wo Cm/mrcp  11/18/2014  CLINICAL DATA:  59 year old male with history of chronic pancreatitis, subsequent encounter. Worsening abdominal pain for the last 4 5 days localizing the epigastric and right upper quadrant region and radiating to the bilateral flank area. Vomiting and unable to tolerate p.o. intake. EXAM: MRI ABDOMEN WITHOUT AND WITH CONTRAST (INCLUDING MRCP) TECHNIQUE: Multiplanar multisequence MR imaging of the abdomen was performed both before and after the administration of intravenous contrast. Heavily T2-weighted images of the biliary and pancreatic ducts were obtained, and three-dimensional MRCP images were rendered by post processing. CONTRAST:  46mL MULTIHANCE GADOBENATE DIMEGLUMINE 529 MG/ML IV SOLN COMPARISON:  CT scan from 08/12/2014.  MRI from 06/16/2011. FINDINGS: Lower chest:  Unremarkable. Hepatobiliary: Assessment of liver parenchyma degraded by breathing motion. No gross mass lesion evident within the liver. There is mild intrahepatic biliary duct dilatation. Gallbladder is prominently distended and there is are layering tiny stones towards the gallbladder neck. Extrahepatic common duct measures 7 mm in diameter. Pancreas: Pancreas is diffusely atrophic with fullness again noted in the region of the pancreatic head. The extensive pancreatic parenchymal calcifications seen on previous CT is not readily evident on MRI given signal void within the calcium deposits. The main pancreatic duct is diffusely dilated, most prominently in the pancreatic head region right measures up to 7 mm diameter. Clustered tiny cysts are noted in the head of the pancreas and uncinate process. Although fine detail is completely obscured by the patient motion, no gross hypervascular lesion is  seen in the pancreas. Heterogeneous perfusion is noted in the region of the pancreatic head and uncinate process which could simply be related to the cystic change and chronic pancreatitis. However, these features could obscure a hypovascular lesion in the pancreatic head. In general, the appearance does not appear substantially changed since 08/12/2014 CT scan. Spleen: No splenomegaly. No focal mass lesion. Adrenals/Urinary Tract: Stable small bilateral adrenal gland adenomas. No hydronephrosis in either kidney. No definite enhancing lesion in either kidney although fine detail obscured by motion artifact. Stomach/Bowel: Stomach is nondistended. No gastric wall thickening. No evidence of outlet obstruction. Duodenum is normally positioned as is the ligament of Treitz. No evidence for small bowel obstruction within the visualized abdomen. Vascular/Lymphatic: There is no gastrohepatic or hepatoduodenal ligament lymphadenopathy. No intraperitoneal or retroperitoneal lymphadenopy. No abdominal aortic aneurysm. Other: No intraperitoneal free fluid. Musculoskeletal: No abnormal marrow signal within the visualized bony anatomy. IMPRESSION: 1. Study is markedly motion degraded. As seen on multiple prior studies, there is diffuse atrophy of the pancreatic parenchyma with associated diffuse dilatation of the main pancreatic duct. Dystrophic calcification throughout the pancreas is less well demonstrated  by MR than CT. Fullness in the region the pancreatic head is stable in appearance when comparing to CT scan of 08/12/2014. No hypervascular lesion identified in the pancreas. No discrete hypovascular mass lesion is evident although changes in the head of the pancreas and associated motion artifact significantly hinder assessment. CT or MR follow-up could be used to ensure continued stability. 2. Gallbladder distention with cholelithiasis. 3. Stable bilateral adrenal adenomas. Electronically Signed   By: Misty Stanley M.D.    On: 11/18/2014 08:34   Medications: I have reviewed the patient's current medications. Scheduled Meds: . aspirin EC  81 mg Oral QHS  . enoxaparin (LOVENOX) injection  40 mg Subcutaneous Q24H  . FLUoxetine  20 mg Oral BID  . folic acid  1 mg Oral Daily  . gabapentin  300 mg Oral TID  . insulin aspart  0-15 Units Subcutaneous TID WC  . insulin detemir  10 Units Subcutaneous Daily  . lipase/protease/amylase  36,000 Units Oral TID  . multivitamin with minerals  1 tablet Oral Daily  . pantoprazole (PROTONIX) IV  40 mg Intravenous Q12H  . thiamine  100 mg Oral Daily   Continuous Infusions:  PRN Meds:.LORazepam **OR** LORazepam, morphine injection Assessment/Plan: Principal Problem:   Chronic pancreatitis (HCC) Active Problems:   Type 2 diabetes, controlled, with neuropathy (HCC)   Chronic pain syndrome   Bipolar disorder (HCC)  Chronic Pancreatitis and Pancreatic Failure: Patient with chronic alcoholic pancreatitis presenting with continued abdominal pain, decreased PO intake, and 30 lb weight loss. Patient appears remarkably stable to be concerned for pseudocyst formation.MRCP demonstrates cholelithiasis without cystitis. MRI shows relatively stable appearance of pancreatic head.  Symptoms likely 2/2 continued decline in pancreatic function, especially since the patient has not been taking Creon and his insulin was stopped after multiple insulin overdoses and hypoglycemic seizures.It is possible his electrolyte abnormalities are 2/2 slight lactic acidosis in the setting of decreased PO intake and orthostatic hypotension.  If patient has transitioned from DMII to DMI in the setting of failed pancreatic endocrine function, then his N/V, weight loss, positive anion gap, and ketones on UA can be explained by a more DKA-like picture.  However, reinitiating insulin can be dangerous in this patient as he has a h/o insulin-induced hypoglycemic seizures.  Will start Levemir 10 U and attempt to speak  with family about help managing his insulin regimen. - Carb Modified diet - Morphine 2 q3h PRN - Protonix - Creon - NS @ 75 ml/hr  [ ]  Orthostatics in AM - PT [ ]  Afternoon BMP to ensure gap is closed  DMII/DMII with peripheral neuropathy: Will speak with family to help manage his insulin regimen. - Levemir 10 U qHS - SSI  - Gabapentin  Depression: Prozac DVT Ppx: Lovenox   Dispo: Disposition is deferred at this time, awaiting improvement of current medical problems.  Anticipated discharge in approximately 1 day(s).   The patient does have a current PCP (Juluis Mire, MD) and does need an [facility] hospital follow-up appointment after discharge.  The patient does not have transportation limitations that hinder transportation to clinic appointments.  .Services Needed at time of discharge: Y = Yes, Blank = No PT:   OT:   RN:   Equipment:   Other:     LOS: 1 day   Iline Oven, MD 11/18/2014, 11:13 AM

## 2014-11-18 NOTE — Discharge Summary (Signed)
Name: Michael [name] MRN: [id number] DOB: 1955/11/23 59 y.o. PCP: Juluis Mire, MD  Date of Admission: 11/17/2014  4:57 PM Date of Discharge: 11/19/2014 Attending Physician: Axel Filler, MD  Discharge Diagnosis: 1. 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 Takes with 5 mg and 2 mg for a 22 mg dose     ARIPiprazole 5 MG tablet  Commonly known as:  ABILIFY  Take 5 mg by mouth at bedtime. Takes with 15 mg and 2 mg for a 22 mg dose     ARIPiprazole 2 MG tablet  Commonly known as:  ABILIFY  Take 2 mg by mouth at bedtime. Takes with 15 mg and 5 mg for a 22 mg dose     aspirin EC 81 MG tablet  Take 81 mg by mouth at bedtime.     feeding supplement (GLUCERNA SHAKE) Liqd  Take 237 mLs by mouth 3 (three) times daily with meals.     FLUoxetine 20 MG capsule  Commonly known as:  PROZAC  Take 20 mg by mouth 2 (two) times daily.     gabapentin 300 MG capsule  Commonly known as:  NEURONTIN  Take 1 capsule (300 mg total) by mouth 3 (three) times daily.     ibuprofen 800 MG tablet  Commonly known as:  ADVIL,MOTRIN  Take 1 tablet (800 mg total) by mouth every 8 (eight) hours as needed.     insulin detemir 100 UNIT/ML injection  Commonly known  as:  LEVEMIR  Inject 0.15 mLs (15 Units total) into the skin at bedtime.     lipase/protease/amylase 36000 UNITS Cpep capsule  Commonly known as:  CREON  Take 1 capsule (36,000 Units total) by mouth 3 (three) times daily.     lovastatin 40 MG tablet  Commonly known as:  MEVACOR  Take 1 tablet (40 mg total) by mouth at bedtime.     magnesium oxide 400 (241.3 MG) MG tablet  Commonly known as:  MAG-OX  Take 1 tablet (400 mg total) by mouth daily.     multivitamin with minerals Tabs tablet  Take 1 tablet by mouth daily.     ondansetron 4 MG tablet  Commonly known as:  ZOFRAN  Take 1 tablet (4 mg total) by mouth every 6 (six) hours.     pantoprazole 40 MG tablet  Commonly known as:  PROTONIX  Take 1 tablet (40 mg total) by mouth daily.     Vitamin D (Ergocalciferol) 50000 UNITS Caps capsule  Commonly known as:  DRISDOL  Take 1 capsule (50,000 Units total) by mouth every 7 (seven) days. Every Thursday  Disposition and follow-up:   Michael [name] was discharged from [facility] in Stable condition.  At the hospital follow up visit please address:  1.  Insulin compliance (make sure he is not taking too much), Creon compliance, alcohol abstention,   2.  Labs / imaging needed at time of follow-up: BMP, CBC  3.  Pending labs/ test needing follow-up: Blood cultures (1/2+ GPC without speciation at discharge)  Follow-up Appointments: Follow-up Information    Follow up with Fatima Sanger, MD On 11/25/2014.   Specialty:  Internal Medicine   Why:  8:45a   Contact information:   [employer] [place] [address] [phone]       Follow up with [place].   Why:  for home health PT and RN   Contact information:   [address] [place] [address] [phone]       Discharge Instructions: Discharge Instructions    Call MD for:  persistant nausea and vomiting    Complete by:  As directed      Call MD for:   severe uncontrolled pain    Complete by:  As directed      Diet - low sodium heart healthy    Complete by:  As directed      Increase activity slowly    Complete by:  As directed            Consultations:    Procedures Performed:  Mr 3d Recon At Scanner  11/18/2014  CLINICAL DATA:  59 year old male with history of chronic pancreatitis, subsequent encounter. Worsening abdominal pain for the last 4 5 days localizing the epigastric and right upper quadrant region and radiating to the bilateral flank area. Vomiting and unable to tolerate p.o. intake. EXAM: MRI ABDOMEN WITHOUT AND WITH CONTRAST (INCLUDING MRCP) TECHNIQUE: Multiplanar multisequence MR imaging of the abdomen was performed both before and after the administration of intravenous contrast. Heavily T2-weighted images of the biliary and pancreatic ducts were obtained, and three-dimensional MRCP images were rendered by post processing. CONTRAST:  36mL MULTIHANCE GADOBENATE DIMEGLUMINE 529 MG/ML IV SOLN COMPARISON:  CT scan from 08/12/2014.  MRI from 06/16/2011. FINDINGS: Lower chest:  Unremarkable. Hepatobiliary: Assessment of liver parenchyma degraded by breathing motion. No gross mass lesion evident within the liver. There is mild intrahepatic biliary duct dilatation. Gallbladder is prominently distended and there is are layering tiny stones towards the gallbladder neck. Extrahepatic common duct measures 7 mm in diameter. Pancreas: Pancreas is diffusely atrophic with fullness again noted in the region of the pancreatic head. The extensive pancreatic parenchymal calcifications seen on previous CT is not readily evident on MRI given signal void within the calcium deposits. The main pancreatic duct is diffusely dilated, most prominently in the pancreatic head region right measures up to 7 mm diameter. Clustered tiny cysts are noted in the head of the pancreas and uncinate process. Although fine detail is completely obscured by the patient motion,  no gross hypervascular lesion is seen in the pancreas. Heterogeneous perfusion is noted in the region of the pancreatic head and uncinate process which could simply be related to the cystic change and chronic pancreatitis. However, these features could obscure a hypovascular lesion in the pancreatic head. In general, the appearance does not appear substantially changed since 08/12/2014 CT scan. Spleen: No splenomegaly. No focal mass lesion. Adrenals/Urinary Tract: Stable small bilateral adrenal gland adenomas. No hydronephrosis in either kidney. No definite enhancing lesion in either kidney although fine  detail obscured by motion artifact. Stomach/Bowel: Stomach is nondistended. No gastric wall thickening. No evidence of outlet obstruction. Duodenum is normally positioned as is the ligament of Treitz. No evidence for small bowel obstruction within the visualized abdomen. Vascular/Lymphatic: There is no gastrohepatic or hepatoduodenal ligament lymphadenopathy. No intraperitoneal or retroperitoneal lymphadenopy. No abdominal aortic aneurysm. Other: No intraperitoneal free fluid. Musculoskeletal: No abnormal marrow signal within the visualized bony anatomy. IMPRESSION: 1. Study is markedly motion degraded. As seen on multiple prior studies, there is diffuse atrophy of the pancreatic parenchyma with associated diffuse dilatation of the main pancreatic duct. Dystrophic calcification throughout the pancreas is less well demonstrated by MR than CT. Fullness in the region the pancreatic head is stable in appearance when comparing to CT scan of 08/12/2014. No hypervascular lesion identified in the pancreas. No discrete hypovascular mass lesion is evident although changes in the head of the pancreas and associated motion artifact significantly hinder assessment. CT or MR follow-up could be used to ensure continued stability. 2. Gallbladder distention with cholelithiasis. 3. Stable bilateral adrenal adenomas. Electronically  Signed   By: Misty Stanley M.D.   On: 11/18/2014 08:34   Mr Jeananne Rama W/wo Cm/mrcp  11/18/2014  CLINICAL DATA:  59 year old male with history of chronic pancreatitis, subsequent encounter. Worsening abdominal pain for the last 4 5 days localizing the epigastric and right upper quadrant region and radiating to the bilateral flank area. Vomiting and unable to tolerate p.o. intake. EXAM: MRI ABDOMEN WITHOUT AND WITH CONTRAST (INCLUDING MRCP) TECHNIQUE: Multiplanar multisequence MR imaging of the abdomen was performed both before and after the administration of intravenous contrast. Heavily T2-weighted images of the biliary and pancreatic ducts were obtained, and three-dimensional MRCP images were rendered by post processing. CONTRAST:  31mL MULTIHANCE GADOBENATE DIMEGLUMINE 529 MG/ML IV SOLN COMPARISON:  CT scan from 08/12/2014.  MRI from 06/16/2011. FINDINGS: Lower chest:  Unremarkable. Hepatobiliary: Assessment of liver parenchyma degraded by breathing motion. No gross mass lesion evident within the liver. There is mild intrahepatic biliary duct dilatation. Gallbladder is prominently distended and there is are layering tiny stones towards the gallbladder neck. Extrahepatic common duct measures 7 mm in diameter. Pancreas: Pancreas is diffusely atrophic with fullness again noted in the region of the pancreatic head. The extensive pancreatic parenchymal calcifications seen on previous CT is not readily evident on MRI given signal void within the calcium deposits. The main pancreatic duct is diffusely dilated, most prominently in the pancreatic head region right measures up to 7 mm diameter. Clustered tiny cysts are noted in the head of the pancreas and uncinate process. Although fine detail is completely obscured by the patient motion, no gross hypervascular lesion is seen in the pancreas. Heterogeneous perfusion is noted in the region of the pancreatic head and uncinate process which could simply be related to the cystic  change and chronic pancreatitis. However, these features could obscure a hypovascular lesion in the pancreatic head. In general, the appearance does not appear substantially changed since 08/12/2014 CT scan. Spleen: No splenomegaly. No focal mass lesion. Adrenals/Urinary Tract: Stable small bilateral adrenal gland adenomas. No hydronephrosis in either kidney. No definite enhancing lesion in either kidney although fine detail obscured by motion artifact. Stomach/Bowel: Stomach is nondistended. No gastric wall thickening. No evidence of outlet obstruction. Duodenum is normally positioned as is the ligament of Treitz. No evidence for small bowel obstruction within the visualized abdomen. Vascular/Lymphatic: There is no gastrohepatic or hepatoduodenal ligament lymphadenopathy. No intraperitoneal or retroperitoneal lymphadenopy. No abdominal aortic aneurysm. Other: No  intraperitoneal free fluid. Musculoskeletal: No abnormal marrow signal within the visualized bony anatomy. IMPRESSION: 1. Study is markedly motion degraded. As seen on multiple prior studies, there is diffuse atrophy of the pancreatic parenchyma with associated diffuse dilatation of the main pancreatic duct. Dystrophic calcification throughout the pancreas is less well demonstrated by MR than CT. Fullness in the region the pancreatic head is stable in appearance when comparing to CT scan of 08/12/2014. No hypervascular lesion identified in the pancreas. No discrete hypovascular mass lesion is evident although changes in the head of the pancreas and associated motion artifact significantly hinder assessment. CT or MR follow-up could be used to ensure continued stability. 2. Gallbladder distention with cholelithiasis. 3. Stable bilateral adrenal adenomas. Electronically Signed   By: Misty Stanley M.D.   On: 11/18/2014 08:34    2D Echo:   Cardiac Cath:   Admission HPI: Mr. Olivencia is a is a 59 y.o. man with past medical history of Type 2 DM, hypertension,  hyperlipidemia, chronic alcoholic pancreatitis, alcohol withdrawal seizures, tobacco use, depression, vitamin D deficiency, and GERD. He is a direct admit from clinic where he was complaining of worsened abdominal pain, 30 lb weight loss over the last month, and positive orthostatics. He was diagnosed with chronic alcoholic pancreatitis in 6010. He has had abdominal pain radiating to the back since that time. He states the pain is worsening, especially with food intake. He has had decreased PO intake over the last few weeks. He has only eaten a can of soup over the last 3 days. He tried to eat a chicken wing today and vomited. He has not been taking his Creon. He endorses feeling lightheaded and dizzy when standing.   He denies fever, chills, cough, congestion, diarrhea, dysuria, or leg swelling. He endorses constipation, with a BM every 3 days.  He has a h/o GERD for which he does not take any medications. He has a h/o DMII for which he is only on metformin. He was previously on Insulin, which was stopped last month because the patient was taking too much insulin and provoked a hypoglycemic seizure.   He still smokes 2-3 cigarettes per day. He denies alcohol use, with his last drink in August.  CT abdomen in July revealed dilatation of the pancreatic duct to 1.0 cm, with new diffuse calcification throughout the pancreas, likely reflecting sequelae of chronic pancreatitis. The pancreatic head is somewhat more bulky than on the prior study, thought to reflect calcification. An underlying mass cannot be excluded, but is not definitely seen. MRCP was recommended, and scheduled for 10/28.   Hospital Course by problem list: Principal Problem:   Chronic pancreatitis ([place]) Active Problems:   Type 2 diabetes, controlled, with neuropathy (HCC)   Chronic pain syndrome   Bipolar disorder (HCC)   Protein-calorie malnutrition, severe   Other specified disorders of pancreatic internal secretion  (HCC)   Chronic Pancreatitis with Endocrine and Exocrine Failure: Patient presented complaining of abdominal pain, N/V, and 30 lb weight loss.  Due to concern for biliary obstruction and possible pancreatic mass on previous CT, MRCP was obtained.  There was no evidence of biliary obstruction or pancreatic cancer.  It was also noticed that the patient had an increased anion gap and ketones in the urine.  As the patient had previously been on insulin, recently stopped due to hypoglycemic seizures 2/2 medication noncompliance, there was concern for endocrine pancreatic failure.  Patient was given IVF and basal insulin with correction in anion gap.  He  was continued on Creon and discharged with Levemir 15 U qHS.  Patient and sister [name]) were counseled on the importance of adherence to prevent readmission as well as repeat hypoglycemic seizures.  Sister agrees to closely monitor the patient's administration of insulin.  Patient understands to only take 15 U and will follow up in clinic.  Positive Blood Culture: Patient presented with WBC 17, without fever or other systemic signs of infection. Due to concern for occult infection, blood and urine cultures were drawn. UA was negative for infection. One blood culture returned positive for GPC in clusters. The second culture was negative. The single positive is felt to be contaminant. No antibiotics indicated at this time. Will follow up cultures and speciation and assess need for antibiotics. Leukocytosis likely 2/2 stress reaction in the setting of near DKA from pancreatic endocrine failure.  DM: Patient with DMII, previously on insulin recently stopped due to hypoglycemic seizures in the setting of insulin overdose.  However, due to continued pancreatic failure, patient appears to have a DMI-type picture, requiring insulin to prevent ketoacidosis.  He was restarted on Levemir 15 U qHS. Patient and sister [name]) were counseled on the importance of  adherence to prevent readmission as well as repeat hypoglycemic seizures.  Sister agrees to closely monitor the patient's administration of insulin.  Patient understands to only take 15 U and will follow up in clinic.  Alcohol Use Disorder: Patient reports last drink was August 2016.  He was counseled that his pancreatitis flairs are 2/2 alcohol use and he states he has stopped drinking and will not drink in the future.  Discharge Vitals:   BP 114/61 mmHg  Pulse 108  Temp(Src) 97.9 F (36.6 C) (Oral)  Resp 18  Ht 5' 4"  (1.626 m)  Wt 129 lb 13.6 oz (58.9 kg)  BMI 22.28 kg/m2  SpO2 93%  Discharge Labs:  Results for orders placed or performed during the hospital encounter of 11/17/14 (from the past 24 hour(s))  Glucose, capillary     Status: Abnormal   Collection Time: 11/18/14 11:56 AM  Result Value Ref Range   Glucose-Capillary 275 (H) 65 - 99 mg/dL  Basic metabolic panel     Status: Abnormal   Collection Time: 11/18/14  3:32 PM  Result Value Ref Range   Sodium 135 135 - 145 mmol/L   Potassium 3.5 3.5 - 5.1 mmol/L   Chloride 98 (L) 101 - 111 mmol/L   CO2 24 22 - 32 mmol/L   Glucose, Bld 426 (H) 65 - 99 mg/dL   BUN 9 6 - 20 mg/dL   Creatinine, Ser 0.76 0.61 - 1.24 mg/dL   Calcium 9.1 8.9 - 10.3 mg/dL   GFR calc non Af Amer >60 >60 mL/min   GFR calc Af Amer >60 >60 mL/min   Anion gap 13 5 - 15  Glucose, capillary     Status: Abnormal   Collection Time: 11/18/14  5:06 PM  Result Value Ref Range   Glucose-Capillary 270 (H) 65 - 99 mg/dL  Glucose, capillary     Status: Abnormal   Collection Time: 11/18/14 10:16 PM  Result Value Ref Range   Glucose-Capillary 116 (H) 65 - 99 mg/dL  Glucose, capillary     Status: Abnormal   Collection Time: 11/19/14  8:07 AM  Result Value Ref Range   Glucose-Capillary 589 (HH) 65 - 99 mg/dL   Comment 1 Notify RN   CBC with Differential/Platelet     Status: None   Collection Time:  11/19/14  8:11 AM  Result Value Ref Range   WBC 9.2 4.0 - 10.5  K/uL   RBC 4.65 4.22 - 5.81 MIL/uL   Hemoglobin 15.0 13.0 - 17.0 g/dL   HCT 43.5 39.0 - 52.0 %   MCV 93.5 78.0 - 100.0 fL   MCH 32.3 26.0 - 34.0 pg   MCHC 34.5 30.0 - 36.0 g/dL   RDW 12.6 11.5 - 15.5 %   Platelets 208 150 - 400 K/uL   Neutrophils Relative % 72 %   Neutro Abs 6.6 1.7 - 7.7 K/uL   Lymphocytes Relative 19 %   Lymphs Abs 1.8 0.7 - 4.0 K/uL   Monocytes Relative 7 %   Monocytes Absolute 0.6 0.1 - 1.0 K/uL   Eosinophils Relative 2 %   Eosinophils Absolute 0.2 0.0 - 0.7 K/uL   Basophils Relative 0 %   Basophils Absolute 0.0 0.0 - 0.1 K/uL  Basic metabolic panel     Status: Abnormal   Collection Time: 11/19/14  8:11 AM  Result Value Ref Range   Sodium 133 (L) 135 - 145 mmol/L   Potassium 3.6 3.5 - 5.1 mmol/L   Chloride 99 (L) 101 - 111 mmol/L   CO2 26 22 - 32 mmol/L   Glucose, Bld 564 (HH) 65 - 99 mg/dL   BUN 8 6 - 20 mg/dL   Creatinine, Ser 0.71 0.61 - 1.24 mg/dL   Calcium 9.1 8.9 - 10.3 mg/dL   GFR calc non Af Amer >60 >60 mL/min   GFR calc Af Amer >60 >60 mL/min   Anion gap 8 5 - 15  Glucose, capillary     Status: Abnormal   Collection Time: 11/19/14 10:15 AM  Result Value Ref Range   Glucose-Capillary 355 (H) 65 - 99 mg/dL    Signed: Iline Oven, MD 11/19/2014, 10:45 AM    Services Ordered on Discharge: [facility] PT and RN Equipment Ordered on Discharge: none

## 2014-11-18 NOTE — Progress Notes (Signed)
Inpatient Diabetes Program Recommendations  AACE/ADA: New Consensus Statement on Inpatient Glycemic Control (2015)  Target Ranges:  Prepandial:   less than 140 mg/dL      Peak postprandial:   less than 180 mg/dL (1-2 hours)      Critically ill patients:  140 - 180 mg/dL   Review of Glycemic Control  Diabetes history: DM 2 Outpatient Diabetes medications: Metformin 1,000 BID Current orders for Inpatient glycemic control: Novolog Moderate TID  Inpatient Diabetes Program Recommendations: Insulin - Basal: Glucose in the 200-300 range. Please consider ordering Levemir 10 units Q24 hrs. HgbA1C: A1c on 9/16 was 6.8%. A1c on 10/25 is 11.8% very poor control at home.   Thanks,  Tama Headings RN, MSN, [facility] Inpatient Diabetes Coordinator Team Pager [phone] (8a-5p)

## 2014-11-18 NOTE — Progress Notes (Signed)
Initial Nutrition Assessment  DOCUMENTATION CODES:   Severe malnutrition in context of chronic illness  INTERVENTION:   -Glucerna Shake po TID, each supplement provides 220 kcal and 10 grams of protein  NUTRITION DIAGNOSIS:   Malnutrition related to chronic illness as evidenced by moderate depletions of muscle mass, severe depletion of muscle mass, moderate depletion of body fat, severe depletion of body fat.  GOAL:   Patient will meet greater than or equal to 90% of their needs  MONITOR:   PO intake, Supplement acceptance, Labs, Weight trends, Skin, I & O's  REASON FOR ASSESSMENT:   Malnutrition Screening Tool    ASSESSMENT:   Mr. Verville is a is a 59 y.o. man with past medical history of Type 2 DM, hypertension, hyperlipidemia, chronic alcoholic pancreatitis, alcohol withdrawal seizures, tobacco use, depression, vitamin D deficiency, and GERD. He is a direct admit from clinic where he was complaining of worsened abdominal pain, 30 lb weight loss over the last month, and positive orthostatics. He was diagnosed with chronic alcoholic pancreatitis in 6553. He has had abdominal pain radiating to the back since that time. He states the pain is worsening, especially with food intake. He has had decreased PO intake over the last few weeks. He has only eaten a can of soup over the last 3 days. He tried to eat a chicken wing today and vomited. He has not been taking his Creon. He endorses feeling lightheaded and dizzy when standing.   Pt admitted with chronic pancreatitis.   Pt was very tangential during interview; it was difficult for this RD to obtain accurate information, as pt needed to be redirected several times to answer this RD's questions.   Pt endorses weight loss (39#, per his report), but unable to quantify time frame for weight loss and is unsure of usual body weight. Reviewed wt hx in EPIC. UBW around 215#. Pt has experienced an ongoing weight loss over the past year  (57# (30.7%)).   Pt reveals that his appetite "comes and goes". He reveals he was eating well PTA prior to vomiting after eating a chicken wing yesterday. Pt was just advanced to a Carb Modified diet for lunch. He expressed desire to eat and was very eager for his lunch meal.   Pt with hx of DM, with poor control. Historically Hgb A1c ranges from 6.5-9.8. Most recently Hgb A1c 11.8 on 11/17/14 (6.8 on 10/09/14). Pt reports he checks his blood sugar 4 times per day himself, however, unable to provide typical range for readings. Noted pt was started on sliding scale insulin with meals on 11/18/14, per clinic note.   Nutrition-Focused physical exam completed. Findings are moderate to severe fat depletion, moderate to severe muscle depletion, and no edema.   Noted pt with hx of ETOH abuse. Noted MVI, folic acid, and vtamin B-1 supplementation. Pt also on Creon.  Labs reviewed: K: 2.9 (on IV supplementation).   Diet Order:  Diet Carb Modified Fluid consistency:: Thin; Room service appropriate?: Yes  Skin:  Reviewed, no issues  Last BM:  11/16/14  Height:   Ht Readings from Last 1 Encounters:  11/17/14 5\' 4"  (1.626 m)    Weight:   Wt Readings from Last 1 Encounters:  11/17/14 132 lb (59.875 kg)    Ideal Body Weight:  59.1 kg  BMI:  Body mass index is 22.65 kg/(m^2).  Estimated Nutritional Needs:   Kcal:  1800-2000  Protein:  80-95 grams  Fluid:  1.8-2.0 L  EDUCATION NEEDS:  Education needs no appropriate at this time  Louanna Vanliew A. Jimmye Norman, RD, LDN, CDE Pager: [phone] After hours Pager: [phone]

## 2014-11-18 NOTE — Evaluation (Signed)
Physical Therapy Evaluation Patient Details Name: Michael [name] MRN: [id number] DOB: November 27, 1955 Today's Date: 11/18/2014   History of Present Illness  Michael [name] is a is a 59 y.o. man with past medical history of Type 2 DM, hypertension, hyperlipidemia, chronic alcoholic pancreatitis, alcohol withdrawal seizures, tobacco use, depression, vitamin D deficiency, and GERD. He is a direct admit from clinic where he was complaining of worsened abdominal pain, 30 lb weight loss over the last month, and positive orthostatics. He was diagnosed with chronic alcoholic pancreatitis in 1093. He has had abdominal pain radiating to the back since that time. He states the pain is worsening, especially with food intake. He has had decreased PO intake over the last few weeks.  Clinical Impression  Pt presents with dependencies in mobility affecting his Independence. Pt demonstrated decreased balance with functional gait. Pt reports a history of falls. Pt required min guard assist on the unit. Pt had several LOB when challenged, but was able to self-correct with use of his hands. Pt reports he has assistance at home from family members. Recommend home with HHPT follow-up and continued acute PT until d/c.    Follow Up Recommendations Home health PT;Supervision for mobility/OOB    Equipment Recommendations  None recommended by PT    Recommendations for Other Services       Precautions / Restrictions Precautions Precautions: Fall Restrictions Weight Bearing Restrictions: No      Mobility  Bed Mobility Overal bed mobility: Independent                Transfers Overall transfer level: Needs assistance Equipment used: None Transfers: Sit to/from Stand Sit to Stand: Min guard         General transfer comment: unsteady, able to recover with use of hands.  Ambulation/Gait Ambulation/Gait assistance: Min guard Ambulation Distance (Feet): 200 Feet Assistive device: None Gait  Pattern/deviations: Step-through pattern;Decreased stride length   Gait velocity interpretation: Below normal speed for age/gender General Gait Details: pt usteady at times, was able to look over shoulder and maintain balance, when asked to speed up he had difficulty controlling his gait and stumbled forward, but was able to self correct with holding onto the rail in the hall. Pt reports h/o falling.  Stairs            Wheelchair Mobility    Modified Rankin (Stroke Patients Only)       Balance Overall balance assessment: Needs assistance Sitting-balance support: No upper extremity supported Sitting balance-Leahy Scale: Normal     Standing balance support: No upper extremity supported Standing balance-Leahy Scale: Fair                 High Level Balance Comments: Pt had several stumbles with balance, but was able to self correct, pt had a noticable sway with static standing during standing vitals.              Pertinent Vitals/Pain Pain Assessment: No/denies pain    Home Living Family/patient expects to be discharged to:: Private residence Living Arrangements: Other relatives Available Help at Discharge: Family Type of Home: House Home Access: Stairs to enter     Home Layout: Two level Home Equipment: None      Prior Function Level of Independence: Independent               Hand Dominance        Extremity/Trunk Assessment   Upper Extremity Assessment: Defer to OT evaluation  Lower Extremity Assessment: Overall WFL for tasks assessed         Communication   Communication: Receptive difficulties  Cognition Arousal/Alertness: Awake/alert Behavior During Therapy: WFL for tasks assessed/performed Overall Cognitive Status: No family/caregiver present to determine baseline cognitive functioning Area of Impairment: Safety/judgement         Safety/Judgement: Decreased awareness of safety;Decreased awareness of deficits      General Comments: decreased balance, h/o falls    General Comments      Exercises        Assessment/Plan    PT Assessment Patient needs continued PT services  PT Diagnosis Difficulty walking   PT Problem List Decreased activity tolerance;Decreased balance;Decreased mobility;Decreased safety awareness;Decreased knowledge of precautions  PT Treatment Interventions Stair training;Functional mobility training;Balance training;Gait training;Therapeutic activities;Therapeutic exercise;Patient/family education   PT Goals (Current goals can be found in the Care Plan section) Acute Rehab PT Goals Patient Stated Goal: To eat. PT Goal Formulation: With patient Time For Goal Achievement: 12/02/14 Potential to Achieve Goals: Good    Frequency Min 3X/week   Barriers to discharge        Co-evaluation               End of Session Equipment Utilized During Treatment: Gait belt Activity Tolerance: Patient tolerated treatment well Patient left: in bed;with call bell/phone within reach Nurse Communication: Mobility status    Functional Assessment Tool Used: clinical observation Functional Limitation: Mobility: Walking and moving around Mobility: Walking and Moving Around Current Status (L8453): At least 1 percent but less than 20 percent impaired, limited or restricted Mobility: Walking and Moving Around Goal Status [phone]): 0 percent impaired, limited or restricted    Time: 3212-2482 PT Time Calculation (min) (ACUTE ONLY): 37 min   Charges:   PT Evaluation $Initial PT Evaluation Tier I: 1 Procedure PT Treatments $Gait Training: 8-22 mins   PT G Codes:   PT G-Codes **NOT FOR INPATIENT CLASS** Functional Assessment Tool Used: clinical observation Functional Limitation: Mobility: Walking and moving around Mobility: Walking and Moving Around Current Status (N0037): At least 1 percent but less than 20 percent impaired, limited or restricted Mobility: Walking and Moving Around  Goal Status [phone]): 0 percent impaired, limited or restricted    Michael [name] 11/18/2014, 10:33 AM

## 2014-11-18 NOTE — Progress Notes (Signed)
Patient refused to have orthostatic vital signs taken.  Patient stated"im tired,just let me rest" Call bell within reach.  RN will continue to monitor patient

## 2014-11-18 NOTE — Care Management Note (Signed)
Case Management Note  Patient Details  Name: TREVAN MESSMAN MRN: [id number] Date of Birth: 1955-06-03  Subjective/Objective:                 Patient from home, was recently active with [facility]. Has HH PT and RN ordered.   Action/Plan:  Referral made to [facility]. No other CM needs at this time.   Expected Discharge Date:                  Expected Discharge Plan:  [place]  In-House Referral:     Discharge planning Services  CM Consult  Post Acute Care Choice:  Home Health Choice offered to:  Patient  DME Arranged:    DME Agency:     HH Arranged:  RN, PT [place] Agency:  [place]  Status of Service:  Completed, signed off  Medicare Important Message Given:    Date Medicare IM Given:    Medicare IM give by:    Date Additional Medicare IM Given:    Additional Medicare Important Message give by:     If discussed at [place] of Stay Meetings, dates discussed:    Additional Comments:  Carles Collet, RN 11/18/2014, 4:04 PM

## 2014-11-19 DIAGNOSIS — K861 Other chronic pancreatitis: Secondary | ICD-10-CM | POA: Diagnosis not present

## 2014-11-19 DIAGNOSIS — K860 Alcohol-induced chronic pancreatitis: Secondary | ICD-10-CM | POA: Diagnosis not present

## 2014-11-19 LAB — CBC WITH DIFFERENTIAL/PLATELET
BASOS ABS: 0 10*3/uL (ref 0.0–0.1)
BASOS PCT: 0 %
EOS PCT: 2 %
Eosinophils Absolute: 0.2 10*3/uL (ref 0.0–0.7)
HCT: 43.5 % (ref 39.0–52.0)
Hemoglobin: 15 g/dL (ref 13.0–17.0)
Lymphocytes Relative: 19 %
Lymphs Abs: 1.8 10*3/uL (ref 0.7–4.0)
MCH: 32.3 pg (ref 26.0–34.0)
MCHC: 34.5 g/dL (ref 30.0–36.0)
MCV: 93.5 fL (ref 78.0–100.0)
MONO ABS: 0.6 10*3/uL (ref 0.1–1.0)
Monocytes Relative: 7 %
NEUTROS ABS: 6.6 10*3/uL (ref 1.7–7.7)
Neutrophils Relative %: 72 %
PLATELETS: 208 10*3/uL (ref 150–400)
RBC: 4.65 MIL/uL (ref 4.22–5.81)
RDW: 12.6 % (ref 11.5–15.5)
WBC: 9.2 10*3/uL (ref 4.0–10.5)

## 2014-11-19 LAB — BASIC METABOLIC PANEL
ANION GAP: 8 (ref 5–15)
BUN: 8 mg/dL (ref 6–20)
CALCIUM: 9.1 mg/dL (ref 8.9–10.3)
CO2: 26 mmol/L (ref 22–32)
Chloride: 99 mmol/L — ABNORMAL LOW (ref 101–111)
Creatinine, Ser: 0.71 mg/dL (ref 0.61–1.24)
Glucose, Bld: 564 mg/dL (ref 65–99)
POTASSIUM: 3.6 mmol/L (ref 3.5–5.1)
Sodium: 133 mmol/L — ABNORMAL LOW (ref 135–145)

## 2014-11-19 LAB — GLUCOSE, CAPILLARY
GLUCOSE-CAPILLARY: 589 mg/dL — AB (ref 65–99)
GLUCOSE-CAPILLARY: 355 mg/dL — AB (ref 65–99)
Glucose-Capillary: 124 mg/dL — ABNORMAL HIGH (ref 65–99)

## 2014-11-19 MED ORDER — PANCRELIPASE (LIP-PROT-AMYL) 36000-114000 UNITS PO CPEP: 36000.0000 [IU] | ORAL_CAPSULE | Freq: Three times a day (TID) | ORAL | Status: DC

## 2014-11-19 MED ORDER — ADULT MULTIVITAMIN W/MINERALS CH: 1.0000 | ORAL_TABLET | Freq: Every day | ORAL | Status: DC

## 2014-11-19 MED ORDER — GLUCERNA SHAKE PO LIQD: 237.0000 mL | Freq: Three times a day (TID) | ORAL | Status: DC

## 2014-11-19 MED ORDER — INSULIN DETEMIR 100 UNIT/ML ~~LOC~~ SOLN: 15.0000 [IU] | Freq: Every day | SUBCUTANEOUS | Status: DC

## 2014-11-19 NOTE — Progress Notes (Addendum)
CBG [phone] paging MD  10/27 0821 2nd page to MD  10/27 0835 3rd page [phone]   [phone] 3rd page returned call awaiting another call back for orders.   [phone] received call from [place] received order.

## 2014-11-19 NOTE — Discharge Instructions (Signed)
1. Take Levemir 15 units ONCE per evening before bed. 2. Take Creon three times daily with meals. 3. Do not drink alcohol.

## 2014-11-19 NOTE — Progress Notes (Signed)
CRITICAL VALUE ALERT  Critical value received:  Blood cx drawn on 10/26 @ 0825 gram + cocci in clusters  Date of notification:  11/19/14  Time of notification:  0954  Critical value read back:Yes.    Nurse who received alert:  Caela Huot   MD notified (1st page):  Dr. Evette Doffing  Time of first page:  1000 on floor notified  MD notified (2nd page):  Time of second page:  Responding MD:    Time MD responded:

## 2014-11-19 NOTE — Progress Notes (Signed)
Subjective: Michael [name].  He is without complaint and slept well.  He denies fever, chills, dizziness, or SOB.  He ambulated the hallway without weakness or balance issues.  Objective: Vital signs in last 24 hours: Filed Vitals:   11/18/14 1152 11/18/14 1153 11/18/14 2217 11/19/14 0534  BP: 123/74 108/96 108/80 114/61  Pulse: 103 111 91 108  Temp:  98.2 F (36.8 C) 98.5 F (36.9 C) 97.9 F (36.6 C)  TempSrc:  Oral Oral Oral  Resp:  20 18 18   Height:      Weight:    129 lb 13.6 oz (58.9 kg)  SpO2:  98% 100% 93%   Weight change: -2 lb 2.4 oz (-0.975 kg)  Intake/Output Summary (Last 24 hours) at 11/19/14 1016 Last data filed at 11/19/14 0901  Gross per 24 hour  Intake    476 ml  Output      0 ml  Net    476 ml   Physical Exam  Constitutional: He is oriented to person, place, and time and well-developed, well-nourished, and in no distress. No distress.  HENT:  Head: Normocephalic and atraumatic.  Eyes: EOM are normal.  Neck: No JVD present.  Cardiovascular: Normal rate, regular rhythm, normal heart sounds and intact distal pulses.   Pulmonary/Chest: Effort normal and breath sounds normal. No respiratory distress. He has no wheezes.  Abdominal: Soft. He exhibits no distension. There is no rebound and no guarding.  Moderate TTP in epigastric region.  Scattered, soft, 1-2 mm subcutaneous palpable nodules appreciated.  Musculoskeletal: He exhibits no edema.  Neurological: He is alert and oriented to person, place, and time.  Skin: Skin is warm and dry. No rash noted.    Lab Results: Basic Metabolic Panel:  Recent Labs Lab 11/17/14 1824  11/18/14 1532 11/19/14 0811  NA 133*  < > 135 133*  K 3.7  < > 3.5 3.6  CL 93*  < > 98* 99*  CO2 23  < > 24 26  GLUCOSE 322*  < > 426* 564*  BUN 8  < > 9 8  CREATININE 0.82  < > 0.76 0.71  CALCIUM 9.7  < > 9.1 9.1  MG 1.9  --   --   --   PHOS 4.1  --   --   --   < > = values in this interval not displayed. Liver Function  Tests:  Recent Labs Lab 11/17/14 1824  AST 16  ALT 19  ALKPHOS 138*  BILITOT 1.0  PROT 7.2  ALBUMIN 3.5    Recent Labs Lab 11/17/14 1824  LIPASE 54*   No results for input(s): AMMONIA in the last 168 hours. CBC:  Recent Labs Lab 11/17/14 1824 11/19/14 0811  WBC 17.3* 9.2  NEUTROABS 13.7* 6.6  HGB 17.8* 15.0  HCT 48.6 43.5  MCV 92.4 93.5  PLT 231 208   Cardiac Enzymes: No results for input(s): CKTOTAL, CKMB, CKMBINDEX, TROPONINI in the last 168 hours. BNP: No results for input(s): PROBNP in the last 168 hours. D-Dimer: No results for input(s): DDIMER in the last 168 hours. CBG:  Recent Labs Lab 11/17/14 2145 11/18/14 0744 11/18/14 1156 11/18/14 1706 11/18/14 2216 11/19/14 0807  GLUCAP 274* 250* 275* 270* 116* 589*   Hemoglobin A1C:  Recent Labs Lab 11/17/14 1824  HGBA1C 11.8*   Fasting Lipid Panel: No results for input(s): CHOL, HDL, LDLCALC, TRIG, CHOLHDL, LDLDIRECT in the last 168 hours. Thyroid Function Tests: No results for input(s): TSH, T4TOTAL, FREET4, T3FREE, THYROIDAB  in the last 168 hours. Coagulation: No results for input(s): LABPROT, INR in the last 168 hours. Anemia Panel: No results for input(s): VITAMINB12, FOLATE, FERRITIN, TIBC, IRON, RETICCTPCT in the last 168 hours. Urine Drug Screen: Drugs of Abuse     Component Value Date/Time   LABOPIA POSITIVE* 11/18/2014 0743   COCAINSCRNUR NONE DETECTED 11/18/2014 0743   COCAINSCRNUR NEG 07/05/2012 0946   LABBENZ POSITIVE* 11/18/2014 0743   LABBENZ NEG 07/05/2012 0946   AMPHETMU NONE DETECTED 11/18/2014 0743   AMPHETMU NEG 07/05/2012 0946   THCU NONE DETECTED 11/18/2014 0743   LABBARB NONE DETECTED 11/18/2014 0743    Alcohol Level:  Recent Labs Lab 11/17/14 1824  ETH <5   Urinalysis:  Recent Labs Lab 11/18/14 0742  COLORURINE YELLOW  LABSPEC 1.037*  PHURINE 5.5  GLUCOSEU >1000*  HGBUR NEGATIVE  BILIRUBINUR SMALL*  KETONESUR >80*  PROTEINUR NEGATIVE  UROBILINOGEN  0.2  NITRITE NEGATIVE  LEUKOCYTESUR NEGATIVE   Misc. Labs:   Micro Results: Recent Results (from the past 240 hour(s))  Culture, blood (routine x 2)     Status: None (Preliminary result)   Collection Time: 11/18/14  8:25 AM  Result Value Ref Range Status   Specimen Description BLOOD RIGHT HAND  Final   Special Requests BOTTLES DRAWN AEROBIC AND ANAEROBIC 10CC  Final   Culture  Setup Time   Final    GRAM POSITIVE COCCI IN CLUSTERS ANAEROBIC BOTTLE ONLY CRITICAL RESULT CALLED TO, READ BACK BY AND VERIFIED WITH: C GLESON 11/19/14 @ 0952 M VESTAL    Culture GRAM POSITIVE COCCI  Final   Report Status PENDING  Incomplete  Culture, blood (routine x 2)     Status: None (Preliminary result)   Collection Time: 11/18/14  8:29 AM  Result Value Ref Range Status   Specimen Description BLOOD LEFT HAND  Final   Special Requests BOTTLES DRAWN AEROBIC AND ANAEROBIC 10CC  Final   Culture PENDING  Incomplete   Report Status PENDING  Incomplete   Studies/Results: Mr 3d Recon At Scanner  11/18/2014  CLINICAL DATA:  59 year old male with history of chronic pancreatitis, subsequent encounter. Worsening abdominal pain for the last 4 5 days localizing the epigastric and right upper quadrant region and radiating to the bilateral flank area. Vomiting and unable to tolerate p.o. intake. EXAM: MRI ABDOMEN WITHOUT AND WITH CONTRAST (INCLUDING MRCP) TECHNIQUE: Multiplanar multisequence MR imaging of the abdomen was performed both before and after the administration of intravenous contrast. Heavily T2-weighted images of the biliary and pancreatic ducts were obtained, and three-dimensional MRCP images were rendered by post processing. CONTRAST:  58mL MULTIHANCE GADOBENATE DIMEGLUMINE 529 MG/ML IV SOLN COMPARISON:  CT scan from 08/12/2014.  MRI from 06/16/2011. FINDINGS: Lower chest:  Unremarkable. Hepatobiliary: Assessment of liver parenchyma degraded by breathing motion. No gross mass lesion evident within the liver.  There is mild intrahepatic biliary duct dilatation. Gallbladder is prominently distended and there is are layering tiny stones towards the gallbladder neck. Extrahepatic common duct measures 7 mm in diameter. Pancreas: Pancreas is diffusely atrophic with fullness again noted in the region of the pancreatic head. The extensive pancreatic parenchymal calcifications seen on previous CT is not readily evident on MRI given signal void within the calcium deposits. The main pancreatic duct is diffusely dilated, most prominently in the pancreatic head region right measures up to 7 mm diameter. Clustered tiny cysts are noted in the head of the pancreas and uncinate process. Although fine detail is completely obscured by the patient motion, no  gross hypervascular lesion is seen in the pancreas. Heterogeneous perfusion is noted in the region of the pancreatic head and uncinate process which could simply be related to the cystic change and chronic pancreatitis. However, these features could obscure a hypovascular lesion in the pancreatic head. In general, the appearance does not appear substantially changed since 08/12/2014 CT scan. Spleen: No splenomegaly. No focal mass lesion. Adrenals/Urinary Tract: Stable small bilateral adrenal gland adenomas. No hydronephrosis in either kidney. No definite enhancing lesion in either kidney although fine detail obscured by motion artifact. Stomach/Bowel: Stomach is nondistended. No gastric wall thickening. No evidence of outlet obstruction. Duodenum is normally positioned as is the ligament of Treitz. No evidence for small bowel obstruction within the visualized abdomen. Vascular/Lymphatic: There is no gastrohepatic or hepatoduodenal ligament lymphadenopathy. No intraperitoneal or retroperitoneal lymphadenopy. No abdominal aortic aneurysm. Other: No intraperitoneal free fluid. Musculoskeletal: No abnormal marrow signal within the visualized bony anatomy. IMPRESSION: 1. Study is markedly  motion degraded. As seen on multiple prior studies, there is diffuse atrophy of the pancreatic parenchyma with associated diffuse dilatation of the main pancreatic duct. Dystrophic calcification throughout the pancreas is less well demonstrated by MR than CT. Fullness in the region the pancreatic head is stable in appearance when comparing to CT scan of 08/12/2014. No hypervascular lesion identified in the pancreas. No discrete hypovascular mass lesion is evident although changes in the head of the pancreas and associated motion artifact significantly hinder assessment. CT or MR follow-up could be used to ensure continued stability. 2. Gallbladder distention with cholelithiasis. 3. Stable bilateral adrenal adenomas. Electronically Signed   By: Misty Stanley M.D.   On: 11/18/2014 08:34   Mr Jeananne Rama W/wo Cm/mrcp  11/18/2014  CLINICAL DATA:  59 year old male with history of chronic pancreatitis, subsequent encounter. Worsening abdominal pain for the last 4 5 days localizing the epigastric and right upper quadrant region and radiating to the bilateral flank area. Vomiting and unable to tolerate p.o. intake. EXAM: MRI ABDOMEN WITHOUT AND WITH CONTRAST (INCLUDING MRCP) TECHNIQUE: Multiplanar multisequence MR imaging of the abdomen was performed both before and after the administration of intravenous contrast. Heavily T2-weighted images of the biliary and pancreatic ducts were obtained, and three-dimensional MRCP images were rendered by post processing. CONTRAST:  35mL MULTIHANCE GADOBENATE DIMEGLUMINE 529 MG/ML IV SOLN COMPARISON:  CT scan from 08/12/2014.  MRI from 06/16/2011. FINDINGS: Lower chest:  Unremarkable. Hepatobiliary: Assessment of liver parenchyma degraded by breathing motion. No gross mass lesion evident within the liver. There is mild intrahepatic biliary duct dilatation. Gallbladder is prominently distended and there is are layering tiny stones towards the gallbladder neck. Extrahepatic common duct  measures 7 mm in diameter. Pancreas: Pancreas is diffusely atrophic with fullness again noted in the region of the pancreatic head. The extensive pancreatic parenchymal calcifications seen on previous CT is not readily evident on MRI given signal void within the calcium deposits. The main pancreatic duct is diffusely dilated, most prominently in the pancreatic head region right measures up to 7 mm diameter. Clustered tiny cysts are noted in the head of the pancreas and uncinate process. Although fine detail is completely obscured by the patient motion, no gross hypervascular lesion is seen in the pancreas. Heterogeneous perfusion is noted in the region of the pancreatic head and uncinate process which could simply be related to the cystic change and chronic pancreatitis. However, these features could obscure a hypovascular lesion in the pancreatic head. In general, the appearance does not appear substantially changed since 08/12/2014 CT  scan. Spleen: No splenomegaly. No focal mass lesion. Adrenals/Urinary Tract: Stable small bilateral adrenal gland adenomas. No hydronephrosis in either kidney. No definite enhancing lesion in either kidney although fine detail obscured by motion artifact. Stomach/Bowel: Stomach is nondistended. No gastric wall thickening. No evidence of outlet obstruction. Duodenum is normally positioned as is the ligament of Treitz. No evidence for small bowel obstruction within the visualized abdomen. Vascular/Lymphatic: There is no gastrohepatic or hepatoduodenal ligament lymphadenopathy. No intraperitoneal or retroperitoneal lymphadenopy. No abdominal aortic aneurysm. Other: No intraperitoneal free fluid. Musculoskeletal: No abnormal marrow signal within the visualized bony anatomy. IMPRESSION: 1. Study is markedly motion degraded. As seen on multiple prior studies, there is diffuse atrophy of the pancreatic parenchyma with associated diffuse dilatation of the main pancreatic duct. Dystrophic  calcification throughout the pancreas is less well demonstrated by MR than CT. Fullness in the region the pancreatic head is stable in appearance when comparing to CT scan of 08/12/2014. No hypervascular lesion identified in the pancreas. No discrete hypovascular mass lesion is evident although changes in the head of the pancreas and associated motion artifact significantly hinder assessment. CT or MR follow-up could be used to ensure continued stability. 2. Gallbladder distention with cholelithiasis. 3. Stable bilateral adrenal adenomas. Electronically Signed   By: Misty Stanley M.D.   On: 11/18/2014 08:34   Medications: I have reviewed the patient's current medications. Scheduled Meds: . aspirin EC  81 mg Oral QHS  . enoxaparin (LOVENOX) injection  40 mg Subcutaneous Q24H  . feeding supplement (GLUCERNA SHAKE)  237 mL Oral TID WC  . FLUoxetine  20 mg Oral BID  . folic acid  1 mg Oral Daily  . gabapentin  300 mg Oral TID  . insulin aspart  0-15 Units Subcutaneous TID WC  . insulin detemir  10 Units Subcutaneous Daily  . lipase/protease/amylase  36,000 Units Oral TID  . multivitamin with minerals  1 tablet Oral Daily  . pantoprazole  40 mg Oral Daily  . thiamine  100 mg Oral Daily   Continuous Infusions:  PRN Meds:.LORazepam **OR** LORazepam Assessment/Plan: Principal Problem:   Chronic pancreatitis (HCC) Active Problems:   Type 2 diabetes, controlled, with neuropathy (HCC)   Chronic pain syndrome   Bipolar disorder (HCC)   Protein-calorie malnutrition, severe  Chronic Pancreatitis and Pancreatic Endocrine and Exocrine Failure: Patient with chronic alcoholic pancreatitis presenting with continued abdominal pain, decreased PO intake, and 30 lb weight loss. Patient appears remarkably stable to be concerned for pseudocyst formation.MRCP demonstrates cholelithiasis without cystitis. MRI shows relatively stable appearance of pancreatic head.  Symptoms likely 2/2 continued decline in  pancreatic function, especially since the patient has not been taking Creon and his insulin was stopped after multiple insulin overdoses and hypoglycemic seizures.It is possible his electrolyte abnormalities are 2/2 slight lactic acidosis in the setting of decreased PO intake and orthostatic hypotension.  If patient has transitioned from DMII to DMI in the setting of failed pancreatic endocrine function, then his N/V, weight loss, positive anion gap, and ketones on UA can be explained by a more DKA-like picture.  However, reinitiating insulin can be dangerous in this patient as he has a h/o insulin-induced hypoglycemic seizures.  Will start Levemir 10 U and attempt to speak with family about help managing his insulin regimen.  His anion gap has closed and he is tolerating PO diet.  Will continue low dose basal insulin on discharge. - Carb Modified diet - Protonix - Creon - PT - Levemir 15 U daily  Positive Blood Culture: Patient presented with WBC 17, without fever or other systemic signs of infection.  Due to concern for occult infection, blood and urine cultures were drawn.  UA was negative for infection.  One blood culture returned positive for GPC in clusters.  The second culture was negative.  The single positive is felt to be contaminant.  No antibiotics indicated at this time.  Will follow up cultures and speciation and assess need for antibiotics.  Leukocytosis likely 2/2 stress reaction in the setting of near DKA from pancreatic endocrine failure. - CTM  DMII/DMII with peripheral neuropathy: Will speak with family to help manage his insulin regimen. - Levemir 15 U qHS - SSI  - Gabapentin  Depression: Prozac DVT Ppx: Lovenox   Dispo: Disposition is deferred at this time, awaiting improvement of current medical problems.  Anticipated discharge in approximately 1 day(s).   The patient does have a current PCP (Juluis Mire, MD) and does need an [facility] hospital follow-up appointment after  discharge.  The patient does not have transportation limitations that hinder transportation to clinic appointments.  .Services Needed at time of discharge: Y = Yes, Blank = No PT:   OT:   RN:   Equipment:   Other:     LOS: 2 days   Iline Oven, MD 11/19/2014, 10:16 AM

## 2014-11-19 NOTE — Plan of Care (Signed)
Problem: Food- and Nutrition-Related Knowledge Deficit (NB-1.1) Goal: Nutrition education Formal process to instruct or train a patient/client in a skill or to impart knowledge to help patients/clients voluntarily manage or modify food choices and eating behavior to maintain or improve health. Outcome: Adequate for Discharge  RD consulted for nutrition education regarding diabetes.     Lab Results  Component Value Date    HGBA1C 11.8* 11/17/2014   Spoke with RN, who requests education prior to discharge. RN reports pt consumed about 6 packs of graham crackers this morning prior to breakfast. Also expressed concern over general knowledge deficit regarding DM self-management.  Pt reports he ate the graham crackers "to put back on the weight that I lost". He reports that his appetite is improving. He reports that his blood sugars generally run around 170, however, admits variation "depending on what I eat". He endorses that he experiences highs when he "eats things like sodas and cakes", but also experiences lows. Pt reports that he typically eats 1 meal per day and generally chooses lean, baked proteins, vegetables, and whole grains. He consumes milk, juice, diet soda, and tea. He reports he ordered chicken, collard greens, mac and cheese, tea, and fruit for lunch. With a fair amount of coaching, pt was able to identify carbohydrate containing foods in his lunch meal.   Spent about 30 minutes with pt going over survival skills basics. Spent a great deal of time on hypoglycemia management, portion sizes, and importance of following up with healthcare team. He is agreeable to choosing low calorie beverages and using low-calorie sweeteners in tea/coffee (discussed specific examples of these), as well as carry hard candy with him for hypoglycemia management. Pt reports he plans to follow-up with CDE at [place] for reinforcement.   Pt with poor health literacy and reading skills;  communicated plan with bedside RN.  RD provided "Carbohydrate Counting for People with Diabetes" handout from the Academy of [employer] and Dietetics. Discussed different food groups and their effects on blood sugar, emphasizing carbohydrate-containing foods. Provided list of carbohydrates and recommended serving sizes of common foods.  Discussed importance of controlled and consistent carbohydrate intake throughout the day. Provided examples of ways to balance meals/snacks and encouraged intake of high-fiber, whole grain complex carbohydrates. Teach back method used.  Expect fair compliance.  Body mass index is 22.28 kg/(m^2). Pt meets criteria for normal weight based on current BMI.  Current diet order is Carb Modified, patient is consuming approximately 85-100% of meals at this time. Labs and medications reviewed. No further nutrition interventions warranted at this time. Pt to be discharged today and plans to follow-up with CDE at [place] [facility] for further reinforcement and support.   Shantay Sonn A. Jimmye Norman, RD, LDN, CDE Pager: [phone] After hours Pager: [phone]

## 2014-11-19 NOTE — Progress Notes (Signed)
CRITICAL VALUE ALERT  Critical value received:  MBO 149   Date of notification:  11/19/14  Time of notification:  0905  Critical value read back:Yes.    Nurse who received alert:  Royanne Warshaw   MD notified (1st page):  MD Merrilee Seashore aware)  Time of first page:  0810  MD notified (2nd page):  Time of second page:  Responding MD:  Merrilee Seashore  Time MD responded:

## 2014-11-19 NOTE — Progress Notes (Signed)
Nsg Discharge Note  Admit Date:  11/17/2014 Discharge date: 11/19/2014   [name] to be D/C'd Home per MD order.  AVS completed.  Copy for chart, and copy for patient signed, and dated. Patient/caregiver able to verbalize understanding.  Discharge Medication:   Medication List    STOP taking these medications        ACCU-CHEK NANO SMARTVIEW W/DEVICE Kit     glucose blood test strip  Commonly known as:  ACCU-CHEK SMARTVIEW     meloxicam 7.5 MG tablet  Commonly known as:  MOBIC     metFORMIN 500 MG tablet  Commonly known as:  GLUCOPHAGE      TAKE these medications        ARIPiprazole 15 MG tablet  Commonly known as:  ABILIFY  Take 15 mg by mouth at bedtime. Takes with 5 mg and 2 mg for a 22 mg dose     ARIPiprazole 5 MG tablet  Commonly known as:  ABILIFY  Take 5 mg by mouth at bedtime. Takes with 15 mg and 2 mg for a 22 mg dose     ARIPiprazole 2 MG tablet  Commonly known as:  ABILIFY  Take 2 mg by mouth at bedtime. Takes with 15 mg and 5 mg for a 22 mg dose     aspirin EC 81 MG tablet  Take 81 mg by mouth at bedtime.     feeding supplement (GLUCERNA SHAKE) Liqd  Take 237 mLs by mouth 3 (three) times daily with meals.     FLUoxetine 20 MG capsule  Commonly known as:  PROZAC  Take 20 mg by mouth 2 (two) times daily.     gabapentin 300 MG capsule  Commonly known as:  NEURONTIN  Take 1 capsule (300 mg total) by mouth 3 (three) times daily.     ibuprofen 800 MG tablet  Commonly known as:  ADVIL,MOTRIN  Take 1 tablet (800 mg total) by mouth every 8 (eight) hours as needed.     insulin detemir 100 UNIT/ML injection  Commonly known as:  LEVEMIR  Inject 0.15 mLs (15 Units total) into the skin at bedtime.     lipase/protease/amylase 36000 UNITS Cpep capsule  Commonly known as:  CREON  Take 1 capsule (36,000 Units total) by mouth 3 (three) times daily.     lovastatin 40 MG tablet  Commonly known as:  MEVACOR  Take 1 tablet (40 mg total) by mouth at  bedtime.     magnesium oxide 400 (241.3 MG) MG tablet  Commonly known as:  MAG-OX  Take 1 tablet (400 mg total) by mouth daily.     multivitamin with minerals Tabs tablet  Take 1 tablet by mouth daily.     ondansetron 4 MG tablet  Commonly known as:  ZOFRAN  Take 1 tablet (4 mg total) by mouth every 6 (six) hours.     pantoprazole 40 MG tablet  Commonly known as:  PROTONIX  Take 1 tablet (40 mg total) by mouth daily.     Vitamin D (Ergocalciferol) 50000 UNITS Caps capsule  Commonly known as:  DRISDOL  Take 1 capsule (50,000 Units total) by mouth every 7 (seven) days. Every Thursday        Discharge Assessment: Filed Vitals:   11/19/14 0534  BP: 114/61  Pulse: 108  Temp: 97.9 F (36.6 C)  Resp: 18   Skin clean, dry and intact without evidence of skin break down, no evidence of skin tears noted. IV catheter  discontinued intact. Site without signs and symptoms of complications - no redness or edema noted at insertion site, patient denies c/o pain - only slight tenderness at site.  Dressing with slight pressure applied.  D/c Instructions-Education: Discharge instructions given to patient/family with verbalized understanding. D/c education completed with patient/family including follow up instructions, medication list, d/c activities limitations if indicated, with other d/c instructions as indicated by MD - patient able to verbalize understanding, all questions fully answered. Patient instructed to return to ED, call 911, or call MD for any changes in condition.  Patient escorted via [place], and D/C home via private auto.  Leveda Kendrix [name], RN 11/19/2014 11:46 AM

## 2014-11-19 NOTE — Progress Notes (Signed)
Inpatient Diabetes Program Recommendations  AACE/ADA: New Consensus Statement on Inpatient Glycemic Control (2015)  Target Ranges:  Prepandial:   less than 140 mg/dL      Peak postprandial:   less than 180 mg/dL (1-2 hours)      Critically ill patients:  140 - 180 mg/dL   Results for CALOGERO, GEISEN (MRN [id number]) as of 11/19/2014 09:16  Ref. Range 11/18/2014 07:44 11/18/2014 11:56 11/18/2014 17:06 11/18/2014 22:16 11/19/2014 08:07  Glucose-Capillary Latest Ref Range: 65-99 mg/dL 250 (H) 275 (H) 270 (H) 116 (H) 589 (HH)   Review of Glycemic Control  Diabetes history: DM 2 Outpatient Diabetes medications: Metformin 1,000 mg BID Current orders for Inpatient glycemic control: Lantus 10 units QHS, Novolog Moderate TID  Inpatient Diabetes Program Recommendations: Insulin - Basal: Consider increasing basal insulin to Lantus 25 units QHS. Please give additional 10-15 units this am.  HgbA1C: A1c on 9/16 was 6.8%. A1c on 10/25 is 11.8% very poor control at home. If agreeable patient may need to be on basal insulin in addition to Metformin at home.   Note: RN reports 4-6 packets of crackers before breakfast this am in addition to juice and syrup on the breakfast tray. RN speaking with patient about carbohydrates this am. Hopefully the increase in basal (due to fasting glucose yesterday as well) and watching trends will improve glucose levels.   Thanks,  Tama Headings RN, MSN, [facility] Inpatient Diabetes Coordinator Team Pager [phone] (8a-5p)

## 2014-11-20 ENCOUNTER — Ambulatory Visit (HOSPITAL_COMMUNITY): Admission: RE | Admit: 2014-11-20 | Payer: Medicaid Other | Source: Ambulatory Visit

## 2014-11-20 ENCOUNTER — Telehealth: Payer: Self-pay | Admitting: Licensed Clinical Social Worker

## 2014-11-21 LAB — CULTURE, BLOOD (ROUTINE X 2)

## 2014-11-23 ENCOUNTER — Telehealth: Payer: Self-pay | Admitting: *Deleted

## 2014-11-23 ENCOUNTER — Other Ambulatory Visit: Payer: Self-pay | Admitting: *Deleted

## 2014-11-23 LAB — CULTURE, BLOOD (ROUTINE X 2): CULTURE: NO GROWTH

## 2014-11-23 MED ORDER — "INSULIN SYRINGE-NEEDLE U-100 31G X 5/16"" 0.3 ML MISC": Status: DC

## 2014-11-23 NOTE — Telephone Encounter (Signed)
Cheryl from [facility] called back - pt has Levemir insulin and syringes at home - prefers to use flex pen. Left eye near tear duct appears sl yellow with yellow drainage. Eye is swollen. Pt told Cheryl  No change with eye since he was in hospital. Hilda Blades Omir Cooprider RN 11/23/14 4:20PM

## 2014-11-23 NOTE — Telephone Encounter (Signed)
Michael [name] with [facility] saw pt this AM - has been in severe pain all over body since hospital discharge. Pt gave pain #9. Pt has tried ibuprofen with no relief.  Pt has Levemir - pt needs flex pen. Pt uses [facility]/[address]. Michael Blades [name] RN 11/23/14 9:45AM

## 2014-11-24 ENCOUNTER — Telehealth: Payer: Self-pay | Admitting: *Deleted

## 2014-11-24 NOTE — Telephone Encounter (Signed)
Jennifer A. Pt care manager at [facility] called back, we discussed that pt needed some assistance with insulin adm., order given for an additional visit to the home this evening appr 2000 and will assist with insulin injection, also will pre draw syringes. Pt notified, he is agreeable and states he will be at his appt tomorrow. He states he has not checked his blood sugar

## 2014-11-24 NOTE — Telephone Encounter (Signed)
Michael [name]'s pharm calls and states that pt has called them multiple times this am wanting them to teach him to give himself insulin, he stated he [facility] the [facility] and that he has not had insulin in 4 days. They were ask by triage nurse to tell him to call this office and that this office was calling [facility] for a new order for someone to see pt today in home and assist him with diab/ insulin teaching and predraw the syringes [place]. Will review pt's chart also

## 2014-11-24 NOTE — Telephone Encounter (Signed)
CDE called patient, he is fine using an insulin pen, he just didn't know how to use th vial. CDE suggested he and his sister learn from [place] and we can go over it again tomorrow at his appointment. Suggest his insulin prescription be change to a pen instead of a vial. Patient says he has plenty of pen needles at home.  CDE also discussed for him to gain weight back to a healthy weight of 135-145#  He needs to control his blood sugars. Reminded him to bring his meter to his appointment tomorrow.

## 2014-11-24 NOTE — Progress Notes (Signed)
Internal Medicine Clinic Attending  I saw and evaluated the patient.  I personally confirmed the key portions of the history and exam documented by Dr. Rathore and I reviewed pertinent patient test results.  The assessment, diagnosis, and plan were formulated together and I agree with the documentation in the resident's note.  

## 2014-11-25 ENCOUNTER — Ambulatory Visit (INDEPENDENT_AMBULATORY_CARE_PROVIDER_SITE_OTHER): Payer: Medicaid Other | Admitting: Internal Medicine

## 2014-11-25 ENCOUNTER — Encounter: Payer: Self-pay | Admitting: Internal Medicine

## 2014-11-25 VITALS — BP 127/85 | HR 101 | Temp 97.7°F | Wt 132.3 lb

## 2014-11-25 DIAGNOSIS — Z794 Long term (current) use of insulin: Secondary | ICD-10-CM

## 2014-11-25 DIAGNOSIS — E0810 Diabetes mellitus due to underlying condition with ketoacidosis without coma: Secondary | ICD-10-CM

## 2014-11-25 DIAGNOSIS — F1021 Alcohol dependence, in remission: Secondary | ICD-10-CM | POA: Diagnosis not present

## 2014-11-25 DIAGNOSIS — E1165 Type 2 diabetes mellitus with hyperglycemia: Secondary | ICD-10-CM | POA: Diagnosis not present

## 2014-11-25 DIAGNOSIS — E1110 Type 2 diabetes mellitus with ketoacidosis without coma: Secondary | ICD-10-CM

## 2014-11-25 DIAGNOSIS — K860 Alcohol-induced chronic pancreatitis: Secondary | ICD-10-CM

## 2014-11-25 LAB — GLUCOSE, CAPILLARY: GLUCOSE-CAPILLARY: 401 mg/dL — AB (ref 65–99)

## 2014-11-25 MED ORDER — INSULIN DETEMIR 100 UNIT/ML ~~LOC~~ SOLN: 15.0000 [IU] | Freq: Every day | SUBCUTANEOUS | Status: DC

## 2014-11-25 MED ORDER — "INSULIN SYRINGE-NEEDLE U-100 31G X 5/16"" 0.3 ML MISC": Status: DC

## 2014-11-25 MED ORDER — ACETAMINOPHEN 500 MG PO TABS: 500.0000 mg | ORAL_TABLET | Freq: Four times a day (QID) | ORAL | Status: DC | PRN

## 2014-11-25 NOTE — Assessment & Plan Note (Signed)
Lab Results  Component Value Date   HGBA1C 11.8* 11/17/2014   HGBA1C 6.8* 10/09/2014   HGBA1C 7.3 06/12/2014     Assessment: Diabetes control:  Poorly-controlled, with recent hospitalizations for both hypo and hyperglycemic-related issues Progress toward A1C goal:   Above our goal of 8-9 in this patient Comments: Patient understands and demonstrates appropriate use of levemir 15U qhs today. Does have home health aids coming to assist. He previously was unsure of how to use the vials so he is switched back to pens. Mr [place]ar[place] is hyperglycemic here today to ~400, but says his home glucoses have ranged from as low as the mid-100s to high 200s. In this patient, we are avoiding short-acting insulin entirely due to his recent history of hypoglycemic seizures (glu <20). His diabetes state is essentially an acquired type 1 from chronic pancreatic insufficiency. While he was recently admitted with hyperglycemia and a DKA-like picture in the setting of not taking insulin, keeping him on a dose of basal insulin will decrease his risk of acidosis while minimizing a more dangerous hypoglycemia in his case.     Plan: Medications:  continue current medications Home glucose monitoring: Frequency:   Timing:   Instruction/counseling given: reminded to bring blood glucose meter & log to each visit, reminded to bring medications to each visit and discussed diet Educational resources provided: brochure Self management tools provided:   Other plans: Will keep on levemir 15U qhs for now until better sense of glycemic control. May increase levemir slightly over time as indicated, but with the understanding of a very liberal A1c goal of 8-9. Avoiding short-acting insulins is especially important in his case.

## 2014-11-25 NOTE — Assessment & Plan Note (Signed)
Since his recent discharge, he has resumed taking his Creon as instructed and is still somewhat constipated but making more regular bowel movements than previously. He also complains of generalized abdominal pain which is stable from previous exams but does prohibit him from eating as much as he'd like.  -Instructed patient on continuing Creon use with PPI as directed as this may help improve his pancreatitis-related pain -Can consider titrating up on Creon if needed depending on change in bowel function and pain -Patient taking ASA and meloxicam - while he admits to abstaining from EtOH since August, some component of pain may be gastritis-related so would like to reduce NSAID use -Switched to acetaminophen 500mg  q6hrs PRN for pain - patient does have mild fatty liver dz with normal LFTs - reduced maximum daily dose of 2000mg  in the setting of mild liver dz is appropriate. Counseled the patient on appropriate use

## 2014-11-25 NOTE — Progress Notes (Signed)
   Subjective:    Patient ID: Michael [name], male    DOB: 10-23-1955, 59 y.o.   MRN: [id number]  HPI [name] is a 59 y.o. with PMH of T2DM, HTN, chronic alcoholic pancreatitis, and history of both hypoglycemic and withdrawal seizures who presents to [facility] today for hospital follow-up. Please see problem-based charting for further pertinent information.  Review of Systems  Constitutional: Positive for fatigue. Negative for fever, chills and diaphoresis.  HENT: Negative for mouth sores and sore throat.   Eyes: Negative for visual disturbance.  Respiratory: Negative for cough and shortness of breath.   Cardiovascular: Negative for chest pain and palpitations.  Gastrointestinal: Positive for abdominal pain. Negative for nausea, vomiting, diarrhea and constipation.  Endocrine: Negative for polyuria.  Genitourinary: Negative for dysuria and flank pain.  Musculoskeletal: Positive for back pain and arthralgias. Negative for myalgias, joint swelling and neck pain.  Skin: Negative for rash.  Neurological: Positive for numbness. Negative for weakness and headaches.  Hematological: Does not bruise/bleed easily.  Psychiatric/Behavioral: Negative for behavioral problems and agitation.  All other systems reviewed and are negative.      Objective:   Physical Exam  Constitutional: He is oriented to person, place, and time. He appears well-developed. No distress.  Cachectic-appearing, but well-groomed  HENT:  Head: Normocephalic and atraumatic.  Right Ear: External ear normal.  Left Ear: External ear normal.  Mouth/Throat: Oropharynx is clear and moist.  Eyes: Conjunctivae and EOM are normal. Pupils are equal, round, and reactive to light. Right eye exhibits discharge. Left eye exhibits no discharge.  Crusted serous discharge from L eye  Neck: Normal range of motion. Neck supple. No JVD present. No thyromegaly present.  Cardiovascular: Normal rate, regular rhythm, normal heart sounds and  intact distal pulses.  Exam reveals no gallop and no friction rub.   No murmur heard. Pulmonary/Chest: Effort normal and breath sounds normal. He has no wheezes. He has no rales.  Abdominal: Soft. Bowel sounds are normal. He exhibits no distension and no mass. There is tenderness. There is no rebound and no guarding.  Moderate generalized tenderness in all abdominal quadrants.  Musculoskeletal: Normal range of motion. He exhibits no edema or tenderness.  Neurological: He is alert and oriented to person, place, and time. No cranial nerve deficit. He exhibits normal muscle tone. Coordination normal.  Skin: Skin is warm and dry. No rash noted. No erythema. No pallor.  Psychiatric: He has a normal mood and affect. His behavior is normal.  Nursing note and vitals reviewed.         Assessment & Plan:  Please see problem-based charting for assessment and plan.  Blane Ohara, MD Resident Physician, PGY-1 Department of Internal Medicine [facility]

## 2014-11-26 ENCOUNTER — Other Ambulatory Visit: Payer: Self-pay | Admitting: Dietician

## 2014-11-26 DIAGNOSIS — E0810 Diabetes mellitus due to underlying condition with ketoacidosis without coma: Secondary | ICD-10-CM

## 2014-11-26 DIAGNOSIS — Z794 Long term (current) use of insulin: Principal | ICD-10-CM

## 2014-11-26 MED ORDER — ACCU-CHEK SOFTCLIX LANCETS MISC: Status: DC

## 2014-11-26 MED ORDER — GLUCOSE BLOOD VI STRP: ORAL_STRIP | Status: DC

## 2014-11-26 MED ORDER — INSULIN PEN NEEDLE 31G X 5 MM MISC: Status: DC

## 2014-11-26 MED ORDER — INSULIN DETEMIR 100 UNIT/ML FLEXPEN: PEN_INJECTOR | SUBCUTANEOUS | Status: DC

## 2014-11-26 MED ORDER — ACCU-CHEK AVIVA PLUS W/DEVICE KIT: PACK | Status: DC

## 2014-11-26 NOTE — Telephone Encounter (Signed)
One meter broken and the other Only has two strips . He has Medicaid- needs an accu check. Will try to send one to pharmacy, but if he has had one in past 3 years, they may not cover the meter and he can pick up a sample from our office.  Linna Hoff from [facility] [phone], she reports he has trouble with vision and that patient would benefit from insulin pen. Will request this. She will review it with him once he gets it. She tried to teach vial but he had trouble with it, so she pre-drew enough for 1 week.   Please order pen needles and Levemir flextouch pens if you agree.

## 2014-11-26 NOTE — Telephone Encounter (Signed)
Thanks for your help -- I just filled his insulin and diabetes supplies.   Dr. Naaman Plummer

## 2014-11-26 NOTE — Telephone Encounter (Signed)
Pt saw Dr Merrilyn Puma in clinic 11/25/14.

## 2014-11-26 NOTE — Progress Notes (Signed)
Internal Medicine Clinic Attending  I saw and evaluated the patient.  I personally confirmed the key portions of the history and exam documented by Dr. Kennedy and I reviewed pertinent patient test results.  The assessment, diagnosis, and plan were formulated together and I agree with the documentation in the resident's note.  

## 2014-11-26 NOTE — Telephone Encounter (Signed)
Michael [name] placed call to [employer] requesting assistance with scheduling transportation for his 11/2 office visit.  Pt complains last time [facility] did not pick him up after he received the confirmation call.  CSW placed call to DSS, request completed and pt notified.  CSW informed [name] per DSS, when confirmation call is received obtain the name and number of the agency to call agency directly for no-showed.

## 2014-11-27 ENCOUNTER — Telehealth: Payer: Self-pay | Admitting: *Deleted

## 2014-11-27 NOTE — Telephone Encounter (Signed)
Pharm states pt told them about his seizures and they do not have a med script for seizure activity, pt was seen 11/2 and did not mention seizures Please advise

## 2014-11-27 NOTE — Telephone Encounter (Signed)
His seizures were from alcohol withdrawal vs hypoglycemia so he does not need to be on anti-epileptic medication for it. Thanks!  Dr. Naaman Plummer

## 2014-11-27 NOTE — Telephone Encounter (Addendum)
Patient says he has just come from the pharmacy and was able to get his insulin pens, pen needles meter and supplies. He requested an appointment with CDE for next week. Scheduled for Friday the 11th at 9:30 [facility] appointment day and time to patient

## 2014-11-30 ENCOUNTER — Other Ambulatory Visit: Payer: Self-pay

## 2014-11-30 MED ORDER — VITAMIN D (ERGOCALCIFEROL) 1.25 MG (50000 UNIT) PO CAPS: 50000.0000 [IU] | ORAL_CAPSULE | ORAL | Status: DC

## 2014-11-30 NOTE — Telephone Encounter (Signed)
[facility]'d [facility] to have new rx sent to them as they have not provided these meds for pt.

## 2014-12-01 NOTE — Telephone Encounter (Signed)
Spoke w/ pharm this am and clarified metformin, seizures and mag ?'s

## 2014-12-02 ENCOUNTER — Telehealth: Payer: Self-pay | Admitting: Licensed Clinical Social Worker

## 2014-12-03 ENCOUNTER — Inpatient Hospital Stay (HOSPITAL_COMMUNITY)
Admission: EM | Admit: 2014-12-03 | Discharge: 2014-12-10 | DRG: 871 | Disposition: A | Discharge status: Home / Self Care | Payer: Medicaid Other | Attending: Internal Medicine | Admitting: Internal Medicine

## 2014-12-03 ENCOUNTER — Encounter (HOSPITAL_COMMUNITY): Payer: Self-pay

## 2014-12-03 ENCOUNTER — Emergency Department (HOSPITAL_COMMUNITY): Payer: Medicaid Other

## 2014-12-03 DIAGNOSIS — R569 Unspecified convulsions: Secondary | ICD-10-CM | POA: Diagnosis present

## 2014-12-03 DIAGNOSIS — E873 Alkalosis: Secondary | ICD-10-CM | POA: Diagnosis present

## 2014-12-03 DIAGNOSIS — Z888 Allergy status to other drugs, medicaments and biological substances status: Secondary | ICD-10-CM | POA: Diagnosis not present

## 2014-12-03 DIAGNOSIS — J690 Pneumonitis due to inhalation of food and vomit: Secondary | ICD-10-CM | POA: Diagnosis present

## 2014-12-03 DIAGNOSIS — G4700 Insomnia, unspecified: Secondary | ICD-10-CM | POA: Diagnosis present

## 2014-12-03 DIAGNOSIS — E785 Hyperlipidemia, unspecified: Secondary | ICD-10-CM | POA: Diagnosis present

## 2014-12-03 DIAGNOSIS — Z833 Family history of diabetes mellitus: Secondary | ICD-10-CM | POA: Diagnosis not present

## 2014-12-03 DIAGNOSIS — F10230 Alcohol dependence with withdrawal, uncomplicated: Secondary | ICD-10-CM | POA: Diagnosis not present

## 2014-12-03 DIAGNOSIS — E1311 Other specified diabetes mellitus with ketoacidosis with coma: Secondary | ICD-10-CM | POA: Diagnosis not present

## 2014-12-03 DIAGNOSIS — I10 Essential (primary) hypertension: Secondary | ICD-10-CM | POA: Diagnosis present

## 2014-12-03 DIAGNOSIS — F10239 Alcohol dependence with withdrawal, unspecified: Secondary | ICD-10-CM | POA: Diagnosis present

## 2014-12-03 DIAGNOSIS — R6510 Systemic inflammatory response syndrome (SIRS) of non-infectious origin without acute organ dysfunction: Secondary | ICD-10-CM

## 2014-12-03 DIAGNOSIS — D72829 Elevated white blood cell count, unspecified: Secondary | ICD-10-CM | POA: Diagnosis present

## 2014-12-03 DIAGNOSIS — G9340 Encephalopathy, unspecified: Secondary | ICD-10-CM | POA: Diagnosis present

## 2014-12-03 DIAGNOSIS — K860 Alcohol-induced chronic pancreatitis: Secondary | ICD-10-CM | POA: Diagnosis present

## 2014-12-03 DIAGNOSIS — E870 Hyperosmolality and hypernatremia: Secondary | ICD-10-CM | POA: Diagnosis present

## 2014-12-03 DIAGNOSIS — F10231 Alcohol dependence with withdrawal delirium: Secondary | ICD-10-CM | POA: Diagnosis not present

## 2014-12-03 DIAGNOSIS — E871 Hypo-osmolality and hyponatremia: Secondary | ICD-10-CM | POA: Diagnosis present

## 2014-12-03 DIAGNOSIS — Z8249 Family history of ischemic heart disease and other diseases of the circulatory system: Secondary | ICD-10-CM

## 2014-12-03 DIAGNOSIS — Z794 Long term (current) use of insulin: Secondary | ICD-10-CM

## 2014-12-03 DIAGNOSIS — F1090 Alcohol use, unspecified, uncomplicated: Secondary | ICD-10-CM | POA: Diagnosis present

## 2014-12-03 DIAGNOSIS — E1100 Type 2 diabetes mellitus with hyperosmolarity without nonketotic hyperglycemic-hyperosmolar coma (NKHHC): Secondary | ICD-10-CM | POA: Diagnosis present

## 2014-12-03 DIAGNOSIS — A419 Sepsis, unspecified organism: Secondary | ICD-10-CM | POA: Diagnosis present

## 2014-12-03 DIAGNOSIS — Z7289 Other problems related to lifestyle: Secondary | ICD-10-CM | POA: Diagnosis present

## 2014-12-03 DIAGNOSIS — E43 Unspecified severe protein-calorie malnutrition: Secondary | ICD-10-CM | POA: Diagnosis present

## 2014-12-03 DIAGNOSIS — E876 Hypokalemia: Secondary | ICD-10-CM | POA: Diagnosis present

## 2014-12-03 DIAGNOSIS — Z6821 Body mass index (BMI) 21.0-21.9, adult: Secondary | ICD-10-CM

## 2014-12-03 DIAGNOSIS — F1010 Alcohol abuse, uncomplicated: Secondary | ICD-10-CM | POA: Diagnosis not present

## 2014-12-03 DIAGNOSIS — E860 Dehydration: Secondary | ICD-10-CM | POA: Diagnosis present

## 2014-12-03 DIAGNOSIS — E872 Acidosis, unspecified: Secondary | ICD-10-CM | POA: Diagnosis present

## 2014-12-03 DIAGNOSIS — Z9114 Patient's other noncompliance with medication regimen: Secondary | ICD-10-CM | POA: Diagnosis not present

## 2014-12-03 DIAGNOSIS — R4182 Altered mental status, unspecified: Secondary | ICD-10-CM | POA: Diagnosis present

## 2014-12-03 DIAGNOSIS — Z79899 Other long term (current) drug therapy: Secondary | ICD-10-CM

## 2014-12-03 DIAGNOSIS — G43909 Migraine, unspecified, not intractable, without status migrainosus: Secondary | ICD-10-CM | POA: Diagnosis present

## 2014-12-03 DIAGNOSIS — Z7982 Long term (current) use of aspirin: Secondary | ICD-10-CM | POA: Diagnosis not present

## 2014-12-03 DIAGNOSIS — E861 Hypovolemia: Secondary | ICD-10-CM | POA: Diagnosis present

## 2014-12-03 DIAGNOSIS — R680 Hypothermia, not associated with low environmental temperature: Secondary | ICD-10-CM | POA: Diagnosis not present

## 2014-12-03 DIAGNOSIS — E0810 Diabetes mellitus due to underlying condition with ketoacidosis without coma: Secondary | ICD-10-CM | POA: Diagnosis not present

## 2014-12-03 DIAGNOSIS — K219 Gastro-esophageal reflux disease without esophagitis: Secondary | ICD-10-CM | POA: Diagnosis present

## 2014-12-03 DIAGNOSIS — T68XXXA Hypothermia, initial encounter: Secondary | ICD-10-CM | POA: Diagnosis present

## 2014-12-03 DIAGNOSIS — G8929 Other chronic pain: Secondary | ICD-10-CM | POA: Diagnosis present

## 2014-12-03 DIAGNOSIS — F17210 Nicotine dependence, cigarettes, uncomplicated: Secondary | ICD-10-CM | POA: Diagnosis present

## 2014-12-03 DIAGNOSIS — R6521 Severe sepsis with septic shock: Secondary | ICD-10-CM

## 2014-12-03 DIAGNOSIS — F1020 Alcohol dependence, uncomplicated: Secondary | ICD-10-CM

## 2014-12-03 DIAGNOSIS — F319 Bipolar disorder, unspecified: Secondary | ICD-10-CM | POA: Diagnosis present

## 2014-12-03 DIAGNOSIS — E1101 Type 2 diabetes mellitus with hyperosmolarity with coma: Secondary | ICD-10-CM | POA: Diagnosis not present

## 2014-12-03 DIAGNOSIS — Z789 Other specified health status: Secondary | ICD-10-CM | POA: Diagnosis present

## 2014-12-03 DIAGNOSIS — E1111 Type 2 diabetes mellitus with ketoacidosis with coma: Secondary | ICD-10-CM

## 2014-12-03 DIAGNOSIS — R509 Fever, unspecified: Secondary | ICD-10-CM

## 2014-12-03 LAB — URINALYSIS, ROUTINE W REFLEX MICROSCOPIC
BILIRUBIN URINE: NEGATIVE
Glucose, UA: >1000 mg/dL — AB
HGB URINE DIPSTICK: NEGATIVE
Ketones, ur: NEGATIVE mg/dL
Leukocytes, UA: NEGATIVE
NITRITE: NEGATIVE
PROTEIN: NEGATIVE mg/dL
Specific Gravity, Urine: 1.034 — ABNORMAL HIGH (ref 1.005–1.030)
UROBILINOGEN UA: 0.2 mg/dL (ref 0.0–1.0)
pH: 6 (ref 5.0–8.0)

## 2014-12-03 LAB — RAPID URINE DRUG SCREEN, HOSP PERFORMED
AMPHETAMINES: NOT DETECTED
BARBITURATES: NOT DETECTED
BENZODIAZEPINES: NOT DETECTED
COCAINE: NOT DETECTED
OPIATES: NOT DETECTED
TETRAHYDROCANNABINOL: NOT DETECTED

## 2014-12-03 LAB — GLUCOSE, CSF: Glucose, CSF: 509 mg/dL — ABNORMAL HIGH (ref 40–70)

## 2014-12-03 LAB — COMPREHENSIVE METABOLIC PANEL
ALK PHOS: 205 U/L — AB (ref 38–126)
ALT: 36 U/L (ref 17–63)
ANION GAP: 19 — AB (ref 5–15)
AST: 33 U/L (ref 15–41)
Albumin: 4.1 g/dL (ref 3.5–5.0)
BILIRUBIN TOTAL: 0.5 mg/dL (ref 0.3–1.2)
BUN: <5 mg/dL — ABNORMAL LOW (ref 6–20)
CALCIUM: 9.8 mg/dL (ref 8.9–10.3)
CO2: 21 mmol/L — AB (ref 22–32)
CREATININE: 1.01 mg/dL (ref 0.61–1.24)
Chloride: 89 mmol/L — ABNORMAL LOW (ref 101–111)
Glucose, Bld: 1221 mg/dL (ref 65–99)
Potassium: 2.8 mmol/L — ABNORMAL LOW (ref 3.5–5.1)
SODIUM: 129 mmol/L — AB (ref 135–145)
TOTAL PROTEIN: 7.3 g/dL (ref 6.5–8.1)

## 2014-12-03 LAB — TROPONIN I

## 2014-12-03 LAB — CBC WITH DIFFERENTIAL/PLATELET
BASOS ABS: 0 10*3/uL (ref 0.0–0.1)
BASOS PCT: 0 %
EOS ABS: 0 10*3/uL (ref 0.0–0.7)
Eosinophils Relative: 0 %
HCT: 49 % (ref 39.0–52.0)
HEMOGLOBIN: 17.6 g/dL — AB (ref 13.0–17.0)
Lymphocytes Relative: 5 %
Lymphs Abs: 0.5 10*3/uL — ABNORMAL LOW (ref 0.7–4.0)
MCH: 33.1 pg (ref 26.0–34.0)
MCHC: 35.9 g/dL (ref 30.0–36.0)
MCV: 92.3 fL (ref 78.0–100.0)
MONOS PCT: 5 %
Monocytes Absolute: 0.6 10*3/uL (ref 0.1–1.0)
NEUTROS PCT: 90 %
Neutro Abs: 10.3 10*3/uL — ABNORMAL HIGH (ref 1.7–7.7)
Platelets: 211 10*3/uL (ref 150–400)
RBC: 5.31 MIL/uL (ref 4.22–5.81)
RDW: 12.9 % (ref 11.5–15.5)
WBC: 11.4 10*3/uL — ABNORMAL HIGH (ref 4.0–10.5)

## 2014-12-03 LAB — CSF CELL COUNT WITH DIFFERENTIAL
RBC COUNT CSF: 0 /mm3
RBC Count, CSF: 14 /mm3 — ABNORMAL HIGH
Tube #: 1
Tube #: 4
WBC CSF: 3 /mm3 (ref 0–5)
WBC, CSF: 1 /mm3 (ref 0–5)

## 2014-12-03 LAB — BASIC METABOLIC PANEL
Anion gap: 13 (ref 5–15)
CHLORIDE: 99 mmol/L — AB (ref 101–111)
CO2: 28 mmol/L (ref 22–32)
CREATININE: 0.87 mg/dL (ref 0.61–1.24)
Calcium: 9.7 mg/dL (ref 8.9–10.3)
Glucose, Bld: 786 mg/dL (ref 65–99)
Potassium: 2.6 mmol/L — CL (ref 3.5–5.1)
SODIUM: 140 mmol/L (ref 135–145)

## 2014-12-03 LAB — BLOOD GAS, VENOUS
Acid-base deficit: 0.9 mmol/L (ref 0.0–2.0)
BICARBONATE: 23.4 meq/L (ref 20.0–24.0)
FIO2: 0.21
O2 Saturation: 85.9 %
PCO2 VEN: 43.5 mmHg — AB (ref 45.0–50.0)
PH VEN: 7.36 — AB (ref 7.250–7.300)
PO2 VEN: 59.4 mmHg — AB (ref 30.0–45.0)
Patient temperature: 101.8
TCO2: 19.9 mmol/L (ref 0–100)

## 2014-12-03 LAB — URINE MICROSCOPIC-ADD ON

## 2014-12-03 LAB — PROTEIN, CSF: Total  Protein, CSF: 63 mg/dL — ABNORMAL HIGH (ref 15–45)

## 2014-12-03 LAB — CBG MONITORING, ED
Glucose-Capillary: >600 mg/dL (ref 65–99)
Glucose-Capillary: 566 mg/dL (ref 65–99)

## 2014-12-03 LAB — I-STAT CHEM 8, ED
CHLORIDE: 89 mmol/L — AB (ref 101–111)
Calcium, Ion: 1.11 mmol/L — ABNORMAL LOW (ref 1.12–1.23)
Creatinine, Ser: 0.7 mg/dL (ref 0.61–1.24)
Glucose, Bld: >700 mg/dL (ref 65–99)
HEMATOCRIT: 57 % — AB (ref 39.0–52.0)
Hemoglobin: 19.4 g/dL — ABNORMAL HIGH (ref 13.0–17.0)
POTASSIUM: 3.7 mmol/L (ref 3.5–5.1)
Sodium: 129 mmol/L — ABNORMAL LOW (ref 135–145)
TCO2: 23 mmol/L (ref 0–100)

## 2014-12-03 LAB — PROTIME-INR
INR: 1 (ref 0.00–1.49)
Prothrombin Time: 13.4 seconds (ref 11.6–15.2)

## 2014-12-03 LAB — I-STAT CG4 LACTIC ACID, ED
LACTIC ACID, VENOUS: 9.72 mmol/L — AB (ref 0.5–2.0)
LACTIC ACID, VENOUS: 3.31 mmol/L — AB (ref 0.5–2.0)

## 2014-12-03 LAB — LIPASE, BLOOD: Lipase: 22 U/L (ref 11–51)

## 2014-12-03 LAB — GLUCOSE, CAPILLARY: Glucose-Capillary: 514 mg/dL — ABNORMAL HIGH (ref 65–99)

## 2014-12-03 MED ORDER — SODIUM CHLORIDE 0.9 % IV SOLN
INTRAVENOUS | Status: DC
  Filled 2014-12-03: qty 2.5

## 2014-12-03 MED ORDER — AMPICILLIN SODIUM 2 G IJ SOLR
2.0000 g | INTRAMUSCULAR | Status: AC
  Administered 2014-12-03: 2 g via INTRAVENOUS
  Filled 2014-12-03: qty 2000

## 2014-12-03 MED ORDER — SODIUM CHLORIDE 0.9 % IJ SOLN
3.0000 mL | Freq: Two times a day (BID) | INTRAMUSCULAR | Status: DC
Start: 2014-12-03 — End: 2014-12-10
  Administered 2014-12-04 – 2014-12-10 (×10): 3 mL via INTRAVENOUS

## 2014-12-03 MED ORDER — ACETAMINOPHEN 650 MG RE SUPP
650.0000 mg | Freq: Once | RECTAL | Status: AC
  Administered 2014-12-03: 650 mg via RECTAL
  Filled 2014-12-03: qty 1

## 2014-12-03 MED ORDER — LORAZEPAM 1 MG PO TABS: 1.0000 mg | ORAL_TABLET | Freq: Four times a day (QID) | ORAL | Status: DC | PRN

## 2014-12-03 MED ORDER — DEXTROSE 5 % IV SOLN
2.0000 g | INTRAVENOUS | Status: AC
  Administered 2014-12-03: 2 g via INTRAVENOUS
  Filled 2014-12-03: qty 2

## 2014-12-03 MED ORDER — ZIPRASIDONE MESYLATE 20 MG IM SOLR
20.0000 mg | Freq: Once | INTRAMUSCULAR | Status: DC
  Filled 2014-12-03: qty 20

## 2014-12-03 MED ORDER — FOLIC ACID 1 MG PO TABS: 1.0000 mg | ORAL_TABLET | Freq: Every day | ORAL | Status: DC

## 2014-12-03 MED ORDER — ADULT MULTIVITAMIN W/MINERALS CH
1.0000 | ORAL_TABLET | Freq: Every day | ORAL | Status: DC
  Administered 2014-12-08 – 2014-12-10 (×3): 1 via ORAL
  Filled 2014-12-03 (×4): qty 1

## 2014-12-03 MED ORDER — LACTATED RINGERS IV SOLN: INTRAVENOUS | Status: DC

## 2014-12-03 MED ORDER — DEXTROSE-NACL 5-0.45 % IV SOLN: INTRAVENOUS | Status: DC

## 2014-12-03 MED ORDER — VANCOMYCIN HCL IN DEXTROSE 1-5 GM/200ML-% IV SOLN
1000.0000 mg | INTRAVENOUS | Status: AC
  Administered 2014-12-03: 1000 mg via INTRAVENOUS
  Filled 2014-12-03: qty 200

## 2014-12-03 MED ORDER — SODIUM CHLORIDE 0.9 % IV SOLN
INTRAVENOUS | Status: DC
  Administered 2014-12-03: 5.4 [IU]/h via INTRAVENOUS
  Filled 2014-12-03: qty 2.5

## 2014-12-03 MED ORDER — LORAZEPAM 2 MG/ML IJ SOLN
0.0000 mg | Freq: Four times a day (QID) | INTRAMUSCULAR | Status: DC
Start: 2014-12-04 — End: 2014-12-05
  Administered 2014-12-04 (×2): 2 mg via INTRAVENOUS
  Administered 2014-12-04: 1 mg via INTRAVENOUS
  Administered 2014-12-05: 2 mg via INTRAVENOUS
  Filled 2014-12-03 (×3): qty 1

## 2014-12-03 MED ORDER — ENOXAPARIN SODIUM 40 MG/0.4ML ~~LOC~~ SOLN: 40.0000 mg | SUBCUTANEOUS | Status: DC

## 2014-12-03 MED ORDER — SODIUM CHLORIDE 0.9 % IV SOLN
INTRAVENOUS | Status: AC
  Administered 2014-12-03: 1000 mL via INTRAVENOUS

## 2014-12-03 MED ORDER — HEPARIN SODIUM (PORCINE) 5000 UNIT/ML IJ SOLN
5000.0000 [IU] | Freq: Three times a day (TID) | INTRAMUSCULAR | Status: DC
  Administered 2014-12-04 – 2014-12-10 (×20): 5000 [IU] via SUBCUTANEOUS
  Filled 2014-12-03 (×20): qty 1

## 2014-12-03 MED ORDER — LORAZEPAM 2 MG/ML IJ SOLN
2.0000 mg | Freq: Once | INTRAMUSCULAR | Status: AC
  Administered 2014-12-03: 2 mg via INTRAVENOUS
  Filled 2014-12-03: qty 1

## 2014-12-03 MED ORDER — METRONIDAZOLE IN NACL 5-0.79 MG/ML-% IV SOLN: 500.0000 mg | Freq: Four times a day (QID) | INTRAVENOUS | Status: DC

## 2014-12-03 MED ORDER — LACTATED RINGERS IV BOLUS (SEPSIS): 1000.0000 mL | Freq: Once | INTRAVENOUS | Status: DC

## 2014-12-03 MED ORDER — METRONIDAZOLE IN NACL 5-0.79 MG/ML-% IV SOLN
500.0000 mg | INTRAVENOUS | Status: DC
  Filled 2014-12-03: qty 100

## 2014-12-03 MED ORDER — POTASSIUM CHLORIDE IN NACL 20-0.45 MEQ/L-% IV SOLN
INTRAVENOUS | Status: DC
  Administered 2014-12-03: 1000 mL via INTRAVENOUS
  Filled 2014-12-03: qty 1000

## 2014-12-03 MED ORDER — VITAMIN B-1 100 MG PO TABS: 100.0000 mg | ORAL_TABLET | Freq: Every day | ORAL | Status: DC

## 2014-12-03 MED ORDER — POTASSIUM CHLORIDE 10 MEQ/100ML IV SOLN: 10.0000 meq | INTRAVENOUS | Status: DC

## 2014-12-03 MED ORDER — VANCOMYCIN HCL 500 MG IV SOLR
500.0000 mg | Freq: Three times a day (TID) | INTRAVENOUS | Status: DC
  Administered 2014-12-04: 500 mg via INTRAVENOUS
  Filled 2014-12-03 (×2): qty 500

## 2014-12-03 MED ORDER — POTASSIUM CHLORIDE 10 MEQ/100ML IV SOLN
10.0000 meq | INTRAVENOUS | Status: AC
  Administered 2014-12-03 – 2014-12-04 (×4): 10 meq via INTRAVENOUS
  Filled 2014-12-03 (×2): qty 100

## 2014-12-03 MED ORDER — DEXTROSE 5 % IV SOLN
10.0000 mg/kg | Freq: Three times a day (TID) | INTRAVENOUS | Status: DC
  Administered 2014-12-04 (×2): 600 mg via INTRAVENOUS
  Filled 2014-12-03 (×2): qty 12

## 2014-12-03 MED ORDER — SODIUM CHLORIDE 0.9 % IV BOLUS (SEPSIS)
1000.0000 mL | INTRAVENOUS | Status: AC
  Administered 2014-12-03: 1000 mL via INTRAVENOUS

## 2014-12-03 MED ORDER — CEFTRIAXONE SODIUM 2 G IJ SOLR
2.0000 g | Freq: Two times a day (BID) | INTRAMUSCULAR | Status: DC
  Administered 2014-12-04: 2 g via INTRAVENOUS
  Filled 2014-12-03: qty 2

## 2014-12-03 MED ORDER — ONDANSETRON HCL 4 MG/2ML IJ SOLN: 4.0000 mg | Freq: Four times a day (QID) | INTRAMUSCULAR | Status: DC | PRN

## 2014-12-03 MED ORDER — LORAZEPAM 2 MG/ML IJ SOLN: 0.0000 mg | Freq: Two times a day (BID) | INTRAMUSCULAR | Status: DC

## 2014-12-03 MED ORDER — THIAMINE HCL 100 MG/ML IJ SOLN
100.0000 mg | Freq: Every day | INTRAMUSCULAR | Status: DC
  Administered 2014-12-04 – 2014-12-08 (×5): 100 mg via INTRAVENOUS
  Filled 2014-12-03 (×5): qty 2

## 2014-12-03 MED ORDER — SODIUM CHLORIDE 0.9 % IV SOLN
INTRAVENOUS | Status: DC
  Administered 2014-12-03: 1000 mL via INTRAVENOUS

## 2014-12-03 MED ORDER — LORAZEPAM 2 MG/ML IJ SOLN
1.0000 mg | Freq: Four times a day (QID) | INTRAMUSCULAR | Status: DC | PRN
  Administered 2014-12-03 – 2014-12-04 (×2): 1 mg via INTRAVENOUS
  Filled 2014-12-03 (×3): qty 1

## 2014-12-03 MED ORDER — LORAZEPAM 2 MG/ML IJ SOLN
1.0000 mg | Freq: Once | INTRAMUSCULAR | Status: AC
  Administered 2014-12-03: 1 mg via INTRAVENOUS
  Filled 2014-12-03: qty 1

## 2014-12-03 MED ORDER — ONDANSETRON HCL 4 MG PO TABS: 4.0000 mg | ORAL_TABLET | Freq: Four times a day (QID) | ORAL | Status: DC | PRN

## 2014-12-03 MED ORDER — AMPICILLIN SODIUM 2 G IJ SOLR
2.0000 g | INTRAMUSCULAR | Status: DC
  Administered 2014-12-04 (×3): 2 g via INTRAVENOUS
  Filled 2014-12-03 (×5): qty 2000

## 2014-12-03 NOTE — ED Notes (Signed)
Per EMS, Pt, from home, c/o altered mental status and hyperglycemia.  Family reported to EMS that they noticed the Pt acting odd 1400 and they noticed a large container of Kool-Aid and two 2 liters of soda missing.  Family sts the Pt has acted like this previously after seizure activity.  No seizure activity witnessed by family or EMS.  Hx of DM, seizures, and substance abuse.

## 2014-12-03 NOTE — ED Notes (Signed)
Bed: WA21 Expected date:  Expected time:  Means of arrival:  Comments: Ems-ams, hyperglycemia

## 2014-12-03 NOTE — ED Notes (Signed)
Lactic Acid= 9.72, MD Nanavati notified

## 2014-12-03 NOTE — ED Notes (Signed)
The patient had one to one care with nurse and tech from the time he was brought into ED unit 2230(which then he was transferred to floor)  Patient was in soft retrains due to attempting to do lumbar puncture and central line with doctor

## 2014-12-03 NOTE — Progress Notes (Addendum)
ANTIBIOTIC CONSULT NOTE - INITIAL  Pharmacy Consult for vancomycin, acyclovir Indication: rule out sepsis, rule out meningitis  Allergies  Allergen Reactions  . Ace Inhibitors Swelling    Angioedema - unquestionable  . Losartan Swelling    Pt presented with unquestionable angioedema on ACEI, so aviod ARBs as well    Patient Measurements:    Vital Signs: Temp: 101.8 F (38.8 C) (11/10 1835) Temp Source: Rectal (11/10 1835) BP: 135/80 mmHg (11/10 1835) Pulse Rate: 121 (11/10 1835) Intake/Output from previous day:   Intake/Output from this shift:    Labs:  Recent Labs  12/03/14 1923  HGB 19.4*  CREATININE 0.70   Estimated Creatinine Clearance: 85.3 mL/min (by C-G formula based on Cr of 0.7). No results for input(s): VANCOTROUGH, VANCOPEAK, VANCORANDOM, GENTTROUGH, GENTPEAK, GENTRANDOM, TOBRATROUGH, TOBRAPEAK, TOBRARND, AMIKACINPEAK, AMIKACINTROU, AMIKACIN in the last 72 hours.   Microbiology: Recent Results (from the past 720 hour(s))  Culture, blood (routine x 2)     Status: None   Collection Time: 11/18/14  8:25 AM  Result Value Ref Range Status   Specimen Description BLOOD RIGHT HAND  Final   Special Requests BOTTLES DRAWN AEROBIC AND ANAEROBIC 10CC  Final   Culture  Setup Time   Final    GRAM POSITIVE COCCI IN CLUSTERS ANAEROBIC BOTTLE ONLY CRITICAL RESULT CALLED TO, READ BACK BY AND VERIFIED WITH: C GLESON 11/19/14 @ 0952 M VESTAL    Culture   Final    STAPHYLOCOCCUS SPECIES (COAGULASE NEGATIVE) THE SIGNIFICANCE OF ISOLATING THIS ORGANISM FROM A SINGLE SET OF BLOOD CULTURES WHEN MULTIPLE SETS ARE DRAWN IS UNCERTAIN. PLEASE NOTIFY THE MICROBIOLOGY DEPARTMENT WITHIN ONE WEEK IF SPECIATION AND SENSITIVITIES ARE REQUIRED.    Report Status 11/21/2014 FINAL  Final  Culture, blood (routine x 2)     Status: None   Collection Time: 11/18/14  8:29 AM  Result Value Ref Range Status   Specimen Description BLOOD LEFT HAND  Final   Special Requests BOTTLES DRAWN AEROBIC  AND ANAEROBIC 10CC  Final   Culture NO GROWTH 5 DAYS  Final   Report Status 11/23/2014 FINAL  Final    Medical History: Past Medical History  Diagnosis Date  . Chronic pancreatitis   . Gastritis due to alcohol without hemorrhage   . HTN (hypertension)   . Pancreatic lesion     appears innocent and related to chronic pancreatitis  . Heavy alcohol use     hx of  . Depression   . Helicobacter pylori gastritis 03/27/2011    on endoscopy, treated with omeprazole, clarithromycin and amoxicillin  . Insomnia   . ACE inhibitor-aggravated angioedema   . Sessile colonic polyp   . GERD (gastroesophageal reflux disease)   . Chronic abdominal pain on narcotics 06/27/2012  . Hyperlipidemia   . Type II diabetes mellitus ([place])   . Migraine     "couple times/year" (11/17/2014)  . Seizures ([place])     "last one was ~ 3 wks ago" (11/17/2014)  . Chronic back pain   . Bipolar disorder ([place])     Medications:  Scheduled:  . acetaminophen  650 mg Rectal Once  . LORazepam  1 mg Intravenous Once  . LORazepam  2 mg Intravenous Once  . ziprasidone  20 mg Intramuscular Once   Infusions:  . cefTRIAXone (ROCEPHIN)  IV    . [START ON 12/04/2014] cefTRIAXone (ROCEPHIN)  IV    . metronidazole    . [START ON 12/04/2014] metronidazole    . sodium chloride    .  vancomycin     Assessment: 59 yo Michael [name] presented to ER with AMS and hyperglycemia now with glucose > 700. PMH inclues DM, seizures and substance abuse. Code Sepsis received and now to start vancomycin per pharmacy and also Rocephin and ampicillin to rule out meningitis as well.   Goal of Therapy:  Vancomycin trough level 15-20 mcg/ml  Plan:  Vancomycin 1g IV x 1 then 500mg  IV q8   Michael Michael [name], PharmD, BCPS Pager [phone] 12/03/2014 7:31 PM      ================================================================= Addendum:  A/P: To add acyclovir for rule out viral meningitis per pharmacy dosing. Start 10mg /kg (600mg ) IV q8 per current  weight and renal function  Michael Michael [name], PharmD, BCPS Pager [phone] 12/03/2014 9:50 PM

## 2014-12-03 NOTE — Progress Notes (Signed)
eLink Physician-Brief Progress Note Patient Name: Michael [name] DOB: 03-28-1955 MRN: [id number]   Date of Service  12/03/2014  HPI/Events of Note   Need dvt prevention No bleeding  eICU Interventions  Sub q hep      Intervention Category Intermediate Interventions: Best-practice therapies (e.g. DVT, beta blocker, etc.)  Raylene Miyamoto. 12/03/2014, 11:44 PM

## 2014-12-03 NOTE — Telephone Encounter (Signed)
[name] placed call to CSW, left message requesting return call.  CSW placed called to pt.  CSW left message requesting return call. CSW provided contact hours and phone number. Attempted contact x2, message left.

## 2014-12-03 NOTE — H&P (Addendum)
[facility]                                                                                    Patient Demographics  Michael [name], is a 59 y.o. male  CSN: [id number]  MRN: [id number]  DOB - November 21, 1955  Admit Date - 12/03/2014  Outpatient Primary MD for the patient is Juluis Mire, MD   With History of -  Past Medical History  Diagnosis Date  . Chronic pancreatitis   . Gastritis due to alcohol without hemorrhage   . HTN (hypertension)   . Pancreatic lesion     appears innocent and related to chronic pancreatitis  . Heavy alcohol use     hx of  . Depression   . Helicobacter pylori gastritis 03/27/2011    on endoscopy, treated with omeprazole, clarithromycin and amoxicillin  . Insomnia   . ACE inhibitor-aggravated angioedema   . Sessile colonic polyp   . GERD (gastroesophageal reflux disease)   . Chronic abdominal pain on narcotics 06/27/2012  . Hyperlipidemia   . Type II diabetes mellitus ([place])   . Migraine     "couple times/year" (11/17/2014)  . Seizures ([place])     "last one was ~ 3 wks ago" (11/17/2014)  . Chronic back pain   . Bipolar disorder [facility])       Past Surgical History  Procedure Laterality Date  . Esophagogastroduodenoscopy  03/22/2011    Procedure: ESOPHAGOGASTRODUODENOSCOPY (EGD);  Surgeon: Gatha Mayer, MD;  Location: [facility];  Service: Endoscopy;  Laterality: N/A;  egd with balloon   . Balloon dilation  03/22/2011    Procedure: BALLOON DILATION;  Surgeon: Gatha Mayer, MD;  Location: [facility];  Service: Endoscopy;  Laterality: N/A;  . Eus  04/20/2011    Procedure: UPPER ENDOSCOPIC ULTRASOUND (EUS) LINEAR;  Surgeon: Milus Banister, MD;  Location: [facility];  Service: Endoscopy;  Laterality: N/A;  . Colonoscopy w/ biopsies and polypectomy  09/12/11  . Colonoscopy N/A 05/22/2012    Procedure: COLONOSCOPY;  Surgeon: Gatha Mayer, MD;  Location: [place];  Service: Endoscopy;  Laterality: N/A;    in for   Chief  Complaint  Patient presents with  . Altered Mental Status  . Hyperglycemia     HPI  Michael [name]  is a 59 y.o. male, with past medical history significant for uncontrolled insulin dependent diabetes mellitus , bipolar disorder, and chronic pancreatitis brought by EMS after he was found unresponsive. He was seen last a week early in the morning today. He lives with his sister and niece and they found him today unresponsive at home. In the emergency room the patient was noted to be febrile with elevated lactic acid level. His blood sugar was elevated and his anion gap was not significantly elevated. LP was done and is still pending and the patient was started on broad-spectrum antibiotics in addition to acyclovir to cover for viral meningitis. Patient had a history of alcohol withdrawal seizures and is not on medications at this time. Patient was seen multiple times and there was a question about his compliance with his insulin and other medications. Tried to talk to the family  and I was able to contact [name] his sister who denies any history of substance abuse other than alcohol. His urine drug screen is still pending    Review of Systems   Unable to obtain due to patient's condition Social History Social History  Substance Use Topics  . Smoking status: Current Every Day Smoker -- 0.60 packs/day for 40 years    Types: Cigarettes  . Smokeless tobacco: Never Used  . Alcohol Use: 6.0 oz/week    10 Standard drinks or equivalent per week     Comment: 11/17/2014 "was drinking 2-5 drinks; stopped ~ 4-5 months"     Family History Family History  Problem Relation Age of Onset  . Colon cancer Neg Hx   . Stomach cancer Neg Hx   . Anesthesia problems Neg Hx   . Hypotension Neg Hx   . Pseudochol deficiency Neg Hx   . Malignant hyperthermia Neg Hx   . Hypertension Sister   . Diabetes Sister   . Heart disease Sister      Prior to Admission medications   Medication Sig Start Date End Date  Taking? Authorizing Provider  acetaminophen (TYLENOL) 500 MG tablet Take 1 tablet (500 mg total) by mouth every 6 (six) hours as needed. 11/25/14 11/25/15 Yes Norval Gable, MD  aspirin EC 81 MG tablet Take 81 mg by mouth at bedtime.   Yes Historical Provider, MD  FLUoxetine (PROZAC) 20 MG capsule Take 20 mg by mouth 2 (two) times daily.    Yes Historical Provider, MD  folic acid (FOLVITE) 1 MG tablet TK 1 T PO D 10/15/14  Yes Historical Provider, MD  gabapentin (NEURONTIN) 300 MG capsule Take 1 capsule (300 mg total) by mouth 3 (three) times daily. 10/23/14  Yes Juluis Mire, MD  Insulin Detemir (LEVEMIR FLEXTOUCH) 100 UNIT/ML Pen Inject 15 units daily before bedtime 11/26/14  Yes Marjan Rabbani, MD  lipase/protease/amylase (CREON) 36000 UNITS CPEP capsule Take 1 capsule (36,000 Units total) by mouth 3 (three) times daily. 11/19/14  Yes Iline Oven, MD  lovastatin (MEVACOR) 40 MG tablet Take 1 tablet (40 mg total) by mouth at bedtime. 03/20/14  Yes Jerrye Noble, MD  magnesium oxide (MAG-OX) 400 (241.3 MG) MG tablet Take 1 tablet (400 mg total) by mouth daily. 10/15/14  Yes Shela Leff, MD  metFORMIN (GLUCOPHAGE) 500 MG tablet TK 2 TS PO BID WITH A MEAL 10/15/14  Yes Historical Provider, MD  Multiple Vitamin (MULTIVITAMIN WITH MINERALS) TABS tablet Take 1 tablet by mouth daily. 11/19/14  Yes Iline Oven, MD  Vitamin D, Ergocalciferol, (DRISDOL) 50000 UNITS CAPS capsule Take 1 capsule (50,000 Units total) by mouth every 7 (seven) days. 11/30/14  Yes Juluis Mire, MD  ACCU-CHEK SOFTCLIX LANCETS lancets Check blood sugar up to 3 times a day 11/26/14   Juluis Mire, MD  Blood Glucose Monitoring Suppl (ACCU-CHEK AVIVA PLUS) W/DEVICE KIT Check blood sugar up to 3 times a day 11/26/14   Juluis Mire, MD  feeding supplement, GLUCERNA SHAKE, (GLUCERNA SHAKE) LIQD Take 237 mLs by mouth 3 (three) times daily with meals. Patient not taking: Reported on 12/03/2014 11/19/14   Iline Oven,  MD  glucose blood (ACCU-CHEK AVIVA PLUS) test strip Check blood sugar up to 3 times a day 11/26/14   Juluis Mire, MD  ibuprofen (ADVIL,MOTRIN) 800 MG tablet Take 1 tablet (800 mg total) by mouth every 8 (eight) hours as needed. 10/30/14   Juluis Mire, MD  Insulin Pen Needle 31G X 5  MM MISC Use to inject 15 units of Levemir one time a day 11/26/14   Juluis Mire, MD  Insulin Syringe-Needle U-100 (B-D INSULIN SYRINGE) 31G X 5/16" 0.3 ML MISC Use to inject insulin into the skin at bedtime each day. diag code E11.40. Insulin dependent 11/25/14   Norval Gable, MD  ondansetron (ZOFRAN) 4 MG tablet Take 1 tablet (4 mg total) by mouth every 6 (six) hours. Patient taking differently: Take 4 mg by mouth every 8 (eight) hours as needed for nausea.  05/14/14   Jerrye Noble, MD  pantoprazole (PROTONIX) 40 MG tablet Take 1 tablet (40 mg total) by mouth daily. Patient not taking: Reported on 12/03/2014 02/07/13   Jerrye Noble, MD    Allergies  Allergen Reactions  . Ace Inhibitors Swelling    Angioedema - unquestionable  . Losartan Swelling    Pt presented with unquestionable angioedema on ACEI, so aviod ARBs as well    Physical Exam  Vitals  Blood pressure 125/87, pulse 129, temperature 101 F (38.3 C), temperature source Rectal, resp. rate 20, SpO2 95 %.   1. General elderly male, well-developed  2. Unresponsive.  3. Unresponsive.  4. Ears and Eyes appear Normal, Conjunctivae clear, PERRLA. Moist Oral Mucosa.  5. Supple Neck, No JVD, No cervical lymphadenopathy appriciated, No Carotid Bruits.  6. Symmetrical Chest wall movement, poor inspiratory efforts.  7. RRR, No Gallops, Rubs or Murmurs, No Parasternal Heave.  8. Positive Bowel Sounds, Abdomen Soft, Non tender, No organomegaly appriciated,No rebound -guarding or rigidity.  9.  No Cyanosis, Normal Skin Turgor, No Skin Rash or Bruise.  10. Good muscle tone,  joints appear normal , no effusions, Normal ROM.  11. No Palpable  Lymph Nodes in Neck or Axillae    Data Review  CBC  Recent Labs Lab 12/03/14 1858 12/03/14 1923  WBC 11.4*  --   HGB 17.6* 19.4*  HCT 49.0 57.0*  PLT 211  --   MCV 92.3  --   MCH 33.1  --   MCHC 35.9  --   RDW 12.9  --   LYMPHSABS 0.5*  --   MONOABS 0.6  --   EOSABS 0.0  --   BASOSABS 0.0  --    ------------------------------------------------------------------------------------------------------------------  Chemistries   Recent Labs Lab 12/03/14 1858 12/03/14 1923  NA 129* 129*  K 2.8* 3.7  CL 89* 89*  CO2 21*  --   GLUCOSE 1221* >700*  BUN <5* <3*  CREATININE 1.01 0.70  CALCIUM 9.8  --   AST 33  --   ALT 36  --   ALKPHOS 205*  --   BILITOT 0.5  --    ------------------------------------------------------------------------------------------------------------------ estimated creatinine clearance is 85.3 mL/min (by C-G formula based on Cr of 0.7). ------------------------------------------------------------------------------------------------------------------ No results for input(s): TSH, T4TOTAL, T3FREE, THYROIDAB in the last 72 hours.  Invalid input(s): FREET3   Coagulation profile  Recent Labs Lab 12/03/14 1858  INR 1.00   ------------------------------------------------------------------------------------------------------------------- No results for input(s): DDIMER in the last 72 hours. -------------------------------------------------------------------------------------------------------------------  Cardiac Enzymes  Recent Labs Lab 12/03/14 1858  TROPONINI <0.03   ------------------------------------------------------------------------------------------------------------------ Invalid input(s): POCBNP   ---------------------------------------------------------------------------------------------------------------  Urinalysis    Component Value Date/Time   COLORURINE YELLOW 12/03/2014 1830   APPEARANCEUR CLEAR 12/03/2014 1830    LABSPEC 1.034* 12/03/2014 1830   PHURINE 6.0 12/03/2014 1830   GLUCOSEU >1000* 12/03/2014 1830   HGBUR NEGATIVE 12/03/2014 1830   BILIRUBINUR NEGATIVE 12/03/2014 [place] 12/03/2014 1830  PROTEINUR NEGATIVE 12/03/2014 1830   UROBILINOGEN 0.2 12/03/2014 1830   NITRITE NEGATIVE 12/03/2014 1830   LEUKOCYTESUR NEGATIVE 12/03/2014 1830    ----------------------------------------------------------------------------------------------------------------   Imaging results:   Dg Chest 1 View  12/03/2014  CLINICAL DATA:  Altered mental status tonight. EXAM: CT HEAD WITHOUT CONTRAST TECHNIQUE: Contiguous axial images were obtained from the base of the skull through the vertex without intravenous contrast. COMPARISON:  10/09/2014 FINDINGS: Stable age advanced cerebral atrophy, ventriculomegaly and periventricular white matter disease. No extra-axial fluid collections are identified. No CT findings for acute hemispheric infarction or intracranial hemorrhage. No mass lesions. The brainstem and cerebellum are normal. No acute bony findings. Stable left maxillary sinus wall thickening. The globes are intact. IMPRESSION: Stable age advanced cerebral atrophy, ventriculomegaly and periventricular white matter disease. No acute intracranial findings or mass lesions. Electronically Signed   By: Marijo Sanes M.D.   On: 12/03/2014 19:37   Ct Head Wo Contrast  12/03/2014  CLINICAL DATA:  Altered mental status tonight. EXAM: CT HEAD WITHOUT CONTRAST TECHNIQUE: Contiguous axial images were obtained from the base of the skull through the vertex without intravenous contrast. COMPARISON:  10/09/2014 FINDINGS: Stable age advanced cerebral atrophy, ventriculomegaly and periventricular white matter disease. No extra-axial fluid collections are identified. No CT findings for acute hemispheric infarction or intracranial hemorrhage. No mass lesions. The brainstem and cerebellum are normal. No acute bony  findings. Stable left maxillary sinus wall thickening. The globes are intact. IMPRESSION: Stable age advanced cerebral atrophy, ventriculomegaly and periventricular white matter disease. No acute intracranial findings or mass lesions. Electronically Signed   By: Marijo Sanes M.D.   On: 12/03/2014 19:37   Mr 3d Recon At Scanner  11/18/2014  CLINICAL DATA:  59 year old male with history of chronic pancreatitis, subsequent encounter. Worsening abdominal pain for the last 4 5 days localizing the epigastric and right upper quadrant region and radiating to the bilateral flank area. Vomiting and unable to tolerate p.o. intake. EXAM: MRI ABDOMEN WITHOUT AND WITH CONTRAST (INCLUDING MRCP) TECHNIQUE: Multiplanar multisequence MR imaging of the abdomen was performed both before and after the administration of intravenous contrast. Heavily T2-weighted images of the biliary and pancreatic ducts were obtained, and three-dimensional MRCP images were rendered by post processing. CONTRAST:  62mL MULTIHANCE GADOBENATE DIMEGLUMINE 529 MG/ML IV SOLN COMPARISON:  CT scan from 08/12/2014.  MRI from 06/16/2011. FINDINGS: Lower chest:  Unremarkable. Hepatobiliary: Assessment of liver parenchyma degraded by breathing motion. No gross mass lesion evident within the liver. There is mild intrahepatic biliary duct dilatation. Gallbladder is prominently distended and there is are layering tiny stones towards the gallbladder neck. Extrahepatic common duct measures 7 mm in diameter. Pancreas: Pancreas is diffusely atrophic with fullness again noted in the region of the pancreatic head. The extensive pancreatic parenchymal calcifications seen on previous CT is not readily evident on MRI given signal void within the calcium deposits. The main pancreatic duct is diffusely dilated, most prominently in the pancreatic head region right measures up to 7 mm diameter. Clustered tiny cysts are noted in the head of the pancreas and uncinate process.  Although fine detail is completely obscured by the patient motion, no gross hypervascular lesion is seen in the pancreas. Heterogeneous perfusion is noted in the region of the pancreatic head and uncinate process which could simply be related to the cystic change and chronic pancreatitis. However, these features could obscure a hypovascular lesion in the pancreatic head. In general, the appearance does not appear substantially changed since 08/12/2014 CT scan.  Spleen: No splenomegaly. No focal mass lesion. Adrenals/Urinary Tract: Stable small bilateral adrenal gland adenomas. No hydronephrosis in either kidney. No definite enhancing lesion in either kidney although fine detail obscured by motion artifact. Stomach/Bowel: Stomach is nondistended. No gastric wall thickening. No evidence of outlet obstruction. Duodenum is normally positioned as is the ligament of Treitz. No evidence for small bowel obstruction within the visualized abdomen. Vascular/Lymphatic: There is no gastrohepatic or hepatoduodenal ligament lymphadenopathy. No intraperitoneal or retroperitoneal lymphadenopy. No abdominal aortic aneurysm. Other: No intraperitoneal free fluid. Musculoskeletal: No abnormal marrow signal within the visualized bony anatomy. IMPRESSION: 1. Study is markedly motion degraded. As seen on multiple prior studies, there is diffuse atrophy of the pancreatic parenchyma with associated diffuse dilatation of the main pancreatic duct. Dystrophic calcification throughout the pancreas is less well demonstrated by MR than CT. Fullness in the region the pancreatic head is stable in appearance when comparing to CT scan of 08/12/2014. No hypervascular lesion identified in the pancreas. No discrete hypovascular mass lesion is evident although changes in the head of the pancreas and associated motion artifact significantly hinder assessment. CT or MR follow-up could be used to ensure continued stability. 2. Gallbladder distention with  cholelithiasis. 3. Stable bilateral adrenal adenomas. Electronically Signed   By: Misty Stanley M.D.   On: 11/18/2014 08:34   Mr Jeananne Rama W/wo Cm/mrcp  11/18/2014  CLINICAL DATA:  59 year old male with history of chronic pancreatitis, subsequent encounter. Worsening abdominal pain for the last 4 5 days localizing the epigastric and right upper quadrant region and radiating to the bilateral flank area. Vomiting and unable to tolerate p.o. intake. EXAM: MRI ABDOMEN WITHOUT AND WITH CONTRAST (INCLUDING MRCP) TECHNIQUE: Multiplanar multisequence MR imaging of the abdomen was performed both before and after the administration of intravenous contrast. Heavily T2-weighted images of the biliary and pancreatic ducts were obtained, and three-dimensional MRCP images were rendered by post processing. CONTRAST:  10mL MULTIHANCE GADOBENATE DIMEGLUMINE 529 MG/ML IV SOLN COMPARISON:  CT scan from 08/12/2014.  MRI from 06/16/2011. FINDINGS: Lower chest:  Unremarkable. Hepatobiliary: Assessment of liver parenchyma degraded by breathing motion. No gross mass lesion evident within the liver. There is mild intrahepatic biliary duct dilatation. Gallbladder is prominently distended and there is are layering tiny stones towards the gallbladder neck. Extrahepatic common duct measures 7 mm in diameter. Pancreas: Pancreas is diffusely atrophic with fullness again noted in the region of the pancreatic head. The extensive pancreatic parenchymal calcifications seen on previous CT is not readily evident on MRI given signal void within the calcium deposits. The main pancreatic duct is diffusely dilated, most prominently in the pancreatic head region right measures up to 7 mm diameter. Clustered tiny cysts are noted in the head of the pancreas and uncinate process. Although fine detail is completely obscured by the patient motion, no gross hypervascular lesion is seen in the pancreas. Heterogeneous perfusion is noted in the region of the pancreatic  head and uncinate process which could simply be related to the cystic change and chronic pancreatitis. However, these features could obscure a hypovascular lesion in the pancreatic head. In general, the appearance does not appear substantially changed since 08/12/2014 CT scan. Spleen: No splenomegaly. No focal mass lesion. Adrenals/Urinary Tract: Stable small bilateral adrenal gland adenomas. No hydronephrosis in either kidney. No definite enhancing lesion in either kidney although fine detail obscured by motion artifact. Stomach/Bowel: Stomach is nondistended. No gastric wall thickening. No evidence of outlet obstruction. Duodenum is normally positioned as is the ligament of Treitz. No  evidence for small bowel obstruction within the visualized abdomen. Vascular/Lymphatic: There is no gastrohepatic or hepatoduodenal ligament lymphadenopathy. No intraperitoneal or retroperitoneal lymphadenopy. No abdominal aortic aneurysm. Other: No intraperitoneal free fluid. Musculoskeletal: No abnormal marrow signal within the visualized bony anatomy. IMPRESSION: 1. Study is markedly motion degraded. As seen on multiple prior studies, there is diffuse atrophy of the pancreatic parenchyma with associated diffuse dilatation of the main pancreatic duct. Dystrophic calcification throughout the pancreas is less well demonstrated by MR than CT. Fullness in the region the pancreatic head is stable in appearance when comparing to CT scan of 08/12/2014. No hypervascular lesion identified in the pancreas. No discrete hypovascular mass lesion is evident although changes in the head of the pancreas and associated motion artifact significantly hinder assessment. CT or MR follow-up could be used to ensure continued stability. 2. Gallbladder distention with cholelithiasis. 3. Stable bilateral adrenal adenomas. Electronically Signed   By: Misty Stanley M.D.   On: 11/18/2014 08:34    My personal review of EKG: Sinus tach at 1 22 bpm with PVCs  and nonspecific ST changes  Personally reviewed Old Chart from 11/2  Assessment & Plan  1. Hyperglycemia with borderline DKA 2. Lactic acidosis 3. Altered mental status with fever; rule out bacterial or viral meningitis, alopecia still pending 4. Fever/SIRS; workup still pending , chest x-ray is not available yet 5. History of bipolar disorder 6. History of chronic pancreatitis 7. History of alcoholism with probable history of alcohol-related seizures  Plan  Admit to step down Cultures done and pending IV antibiotics including vancomycin, Rocephin and acyclovir to cover for viral or bacterial meningitis IV insulin per protocol IV fluids normal saline and lactated Ringer Replete potassium May have to consult ID in a.m. if fever persists   Addendum: IV fluids switched to half-normal saline with potassium due to fear of hyperchloremic metabolic alkalosis with sodium being around 140 and potassium 2.8  DVT Prophylaxis Lovenox  AM Labs Ordered, also please review Full Orders  Code Status full  Disposition Plan: Home  Time spent in minutes : 44 minutes  Condition critical   @SIGNATURE @

## 2014-12-04 ENCOUNTER — Ambulatory Visit: Payer: Medicaid Other | Admitting: Dietician

## 2014-12-04 ENCOUNTER — Inpatient Hospital Stay (HOSPITAL_COMMUNITY)
Admit: 2014-12-04 | Discharge: 2014-12-04 | Disposition: A | Discharge status: Home / Self Care | Payer: Medicaid Other | Attending: Internal Medicine | Admitting: Internal Medicine

## 2014-12-04 ENCOUNTER — Inpatient Hospital Stay (HOSPITAL_COMMUNITY): Payer: Medicaid Other

## 2014-12-04 DIAGNOSIS — G9340 Encephalopathy, unspecified: Secondary | ICD-10-CM

## 2014-12-04 DIAGNOSIS — E1101 Type 2 diabetes mellitus with hyperosmolarity with coma: Secondary | ICD-10-CM

## 2014-12-04 DIAGNOSIS — E0810 Diabetes mellitus due to underlying condition with ketoacidosis without coma: Secondary | ICD-10-CM

## 2014-12-04 DIAGNOSIS — A419 Sepsis, unspecified organism: Principal | ICD-10-CM

## 2014-12-04 DIAGNOSIS — E871 Hypo-osmolality and hyponatremia: Secondary | ICD-10-CM

## 2014-12-04 DIAGNOSIS — R4182 Altered mental status, unspecified: Secondary | ICD-10-CM

## 2014-12-04 DIAGNOSIS — F1010 Alcohol abuse, uncomplicated: Secondary | ICD-10-CM

## 2014-12-04 DIAGNOSIS — E1165 Type 2 diabetes mellitus with hyperglycemia: Secondary | ICD-10-CM

## 2014-12-04 LAB — GLUCOSE, CAPILLARY
GLUCOSE-CAPILLARY: 112 mg/dL — AB (ref 65–99)
GLUCOSE-CAPILLARY: 146 mg/dL — AB (ref 65–99)
GLUCOSE-CAPILLARY: 202 mg/dL — AB (ref 65–99)
GLUCOSE-CAPILLARY: 242 mg/dL — AB (ref 65–99)
GLUCOSE-CAPILLARY: 336 mg/dL — AB (ref 65–99)
Glucose-Capillary: 181 mg/dL — ABNORMAL HIGH (ref 65–99)
Glucose-Capillary: 122 mg/dL — ABNORMAL HIGH (ref 65–99)
Glucose-Capillary: 108 mg/dL — ABNORMAL HIGH (ref 65–99)
Glucose-Capillary: 133 mg/dL — ABNORMAL HIGH (ref 65–99)

## 2014-12-04 LAB — ETHANOL: Alcohol, Ethyl (B): <5 mg/dL (ref <5)

## 2014-12-04 LAB — LACTIC ACID, PLASMA
LACTIC ACID, VENOUS: 1.7 mmol/L (ref 0.5–2.0)
LACTIC ACID, VENOUS: 2.5 mmol/L — AB (ref 0.5–2.0)

## 2014-12-04 LAB — COMPREHENSIVE METABOLIC PANEL
ALT: 23 U/L (ref 17–63)
AST: 17 U/L (ref 15–41)
Albumin: 2.8 g/dL — ABNORMAL LOW (ref 3.5–5.0)
Alkaline Phosphatase: 117 U/L (ref 38–126)
Anion gap: 5 (ref 5–15)
CHLORIDE: 111 mmol/L (ref 101–111)
CO2: 29 mmol/L (ref 22–32)
CREATININE: 0.53 mg/dL — AB (ref 0.61–1.24)
Calcium: 8.3 mg/dL — ABNORMAL LOW (ref 8.9–10.3)
GFR calc non Af Amer: >60 mL/min (ref >60)
Glucose, Bld: 79 mg/dL (ref 65–99)
Potassium: 3.3 mmol/L — ABNORMAL LOW (ref 3.5–5.1)
SODIUM: 145 mmol/L (ref 135–145)
Total Bilirubin: 0.8 mg/dL (ref 0.3–1.2)
Total Protein: 5.3 g/dL — ABNORMAL LOW (ref 6.5–8.1)

## 2014-12-04 LAB — CBC
HEMATOCRIT: 36.2 % — AB (ref 39.0–52.0)
Hemoglobin: 13.4 g/dL (ref 13.0–17.0)
MCH: 33.3 pg (ref 26.0–34.0)
MCHC: 37 g/dL — ABNORMAL HIGH (ref 30.0–36.0)
MCV: 89.8 fL (ref 78.0–100.0)
PLATELETS: 184 10*3/uL (ref 150–400)
RBC: 4.03 MIL/uL — AB (ref 4.22–5.81)
RDW: 12.4 % (ref 11.5–15.5)
WBC: 15.3 10*3/uL — ABNORMAL HIGH (ref 4.0–10.5)

## 2014-12-04 LAB — BASIC METABOLIC PANEL
ANION GAP: 7 (ref 5–15)
ANION GAP: 9 (ref 5–15)
BUN: <5 mg/dL — ABNORMAL LOW (ref 6–20)
BUN: <5 mg/dL — ABNORMAL LOW (ref 6–20)
CO2: 26 mmol/L (ref 22–32)
CO2: 27 mmol/L (ref 22–32)
Calcium: 8.2 mg/dL — ABNORMAL LOW (ref 8.9–10.3)
Calcium: 8.8 mg/dL — ABNORMAL LOW (ref 8.9–10.3)
Chloride: 111 mmol/L (ref 101–111)
Chloride: 112 mmol/L — ABNORMAL HIGH (ref 101–111)
Creatinine, Ser: 0.55 mg/dL — ABNORMAL LOW (ref 0.61–1.24)
Creatinine, Ser: 0.69 mg/dL (ref 0.61–1.24)
GFR calc Af Amer: >60 mL/min (ref >60)
GFR calc Af Amer: >60 mL/min (ref >60)
Glucose, Bld: 274 mg/dL — ABNORMAL HIGH (ref 65–99)
Glucose, Bld: 334 mg/dL — ABNORMAL HIGH (ref 65–99)
POTASSIUM: 2 mmol/L — AB (ref 3.5–5.1)
POTASSIUM: 3 mmol/L — AB (ref 3.5–5.1)
SODIUM: 145 mmol/L (ref 135–145)
SODIUM: 147 mmol/L — AB (ref 135–145)

## 2014-12-04 LAB — URINE CULTURE: CULTURE: NO GROWTH

## 2014-12-04 LAB — MRSA PCR SCREENING: MRSA by PCR: POSITIVE — AB

## 2014-12-04 LAB — BRAIN NATRIURETIC PEPTIDE: B Natriuretic Peptide: 32.8 pg/mL (ref 0.0–100.0)

## 2014-12-04 LAB — MAGNESIUM: Magnesium: 1.9 mg/dL (ref 1.7–2.4)

## 2014-12-04 LAB — PHOSPHORUS: Phosphorus: 1.1 mg/dL — ABNORMAL LOW (ref 2.5–4.6)

## 2014-12-04 MED ORDER — HALOPERIDOL LACTATE 5 MG/ML IJ SOLN
2.0000 mg | Freq: Once | INTRAMUSCULAR | Status: AC
  Administered 2014-12-04: 2 mg via INTRAVENOUS
  Filled 2014-12-04: qty 1

## 2014-12-04 MED ORDER — CETYLPYRIDINIUM CHLORIDE 0.05 % MT LIQD
7.0000 mL | Freq: Two times a day (BID) | OROMUCOSAL | Status: DC
  Administered 2014-12-04 – 2014-12-06 (×5): 7 mL via OROMUCOSAL

## 2014-12-04 MED ORDER — DEXTROSE 5 % IV SOLN
1.0000 g | INTRAVENOUS | Status: DC
  Administered 2014-12-05: 1 g via INTRAVENOUS
  Filled 2014-12-04: qty 10

## 2014-12-04 MED ORDER — INSULIN DETEMIR 100 UNIT/ML ~~LOC~~ SOLN
5.0000 [IU] | Freq: Once | SUBCUTANEOUS | Status: AC
  Administered 2014-12-04: 5 [IU] via SUBCUTANEOUS
  Filled 2014-12-04: qty 0.05

## 2014-12-04 MED ORDER — CHLORHEXIDINE GLUCONATE CLOTH 2 % EX PADS
6.0000 | MEDICATED_PAD | Freq: Every day | CUTANEOUS | Status: AC
  Administered 2014-12-04 – 2014-12-08 (×5): 6 via TOPICAL

## 2014-12-04 MED ORDER — CHLORHEXIDINE GLUCONATE CLOTH 2 % EX PADS: 6.0000 | MEDICATED_PAD | Freq: Every day | CUTANEOUS | Status: DC

## 2014-12-04 MED ORDER — SODIUM CHLORIDE 0.9 % IV BOLUS (SEPSIS)
1000.0000 mL | Freq: Once | INTRAVENOUS | Status: AC
  Administered 2014-12-04: 1000 mL via INTRAVENOUS

## 2014-12-04 MED ORDER — INSULIN ASPART 100 UNIT/ML ~~LOC~~ SOLN
0.0000 [IU] | Freq: Three times a day (TID) | SUBCUTANEOUS | Status: DC
  Administered 2014-12-04: 5 [IU] via SUBCUTANEOUS
  Administered 2014-12-04: 2 [IU] via SUBCUTANEOUS
  Administered 2014-12-05: 5 [IU] via SUBCUTANEOUS

## 2014-12-04 MED ORDER — MUPIROCIN 2 % EX OINT: 1.0000 "application " | TOPICAL_OINTMENT | Freq: Two times a day (BID) | CUTANEOUS | Status: DC

## 2014-12-04 MED ORDER — CHLORHEXIDINE GLUCONATE 0.12 % MT SOLN
15.0000 mL | Freq: Two times a day (BID) | OROMUCOSAL | Status: DC
  Administered 2014-12-04 – 2014-12-06 (×6): 15 mL via OROMUCOSAL
  Filled 2014-12-04 (×4): qty 15

## 2014-12-04 MED ORDER — DEXMEDETOMIDINE HCL IN NACL 200 MCG/50ML IV SOLN
0.4000 ug/kg/h | INTRAVENOUS | Status: DC
  Administered 2014-12-04 – 2014-12-05 (×4): 0.4 ug/kg/h via INTRAVENOUS
  Administered 2014-12-06: 0.5 ug/kg/h via INTRAVENOUS
  Administered 2014-12-06: 0.9 ug/kg/h via INTRAVENOUS
  Administered 2014-12-06: 0.4 ug/kg/h via INTRAVENOUS
  Administered 2014-12-06: 1 ug/kg/h via INTRAVENOUS
  Administered 2014-12-06 – 2014-12-07 (×2): 0.9 ug/kg/h via INTRAVENOUS
  Filled 2014-12-04 (×11): qty 50

## 2014-12-04 MED ORDER — GLUCERNA SHAKE PO LIQD
237.0000 mL | Freq: Three times a day (TID) | ORAL | Status: DC
  Filled 2014-12-04 (×4): qty 237

## 2014-12-04 MED ORDER — DEXTROSE 5 % IV SOLN
30.0000 mmol | Freq: Once | INTRAVENOUS | Status: AC
  Administered 2014-12-04: 30 mmol via INTRAVENOUS
  Filled 2014-12-04: qty 10

## 2014-12-04 MED ORDER — POTASSIUM PHOSPHATES 15 MMOLE/5ML IV SOLN
30.0000 mmol | Freq: Once | INTRAVENOUS | Status: AC
  Administered 2014-12-04: 30 mmol via INTRAVENOUS
  Filled 2014-12-04: qty 10

## 2014-12-04 MED ORDER — MAGNESIUM SULFATE 50 % IJ SOLN
2.0000 g | Freq: Once | INTRAVENOUS | Status: AC
  Administered 2014-12-04: 2 g via INTRAVENOUS
  Filled 2014-12-04: qty 4

## 2014-12-04 MED ORDER — MAGNESIUM SULFATE 50 % IJ SOLN: 4.0000 g | Freq: Once | INTRAVENOUS | Status: DC

## 2014-12-04 MED ORDER — ENSURE ENLIVE PO LIQD
237.0000 mL | Freq: Two times a day (BID) | ORAL | Status: DC
Start: 2014-12-04 — End: 2014-12-04

## 2014-12-04 MED ORDER — SODIUM CHLORIDE 0.45 % IV SOLN
INTRAVENOUS | Status: DC
  Administered 2014-12-04: 02:00:00 via INTRAVENOUS

## 2014-12-04 MED ORDER — INSULIN ASPART 100 UNIT/ML ~~LOC~~ SOLN: 0.0000 [IU] | Freq: Every day | SUBCUTANEOUS | Status: DC

## 2014-12-04 MED ORDER — HALOPERIDOL LACTATE 5 MG/ML IJ SOLN
1.0000 mg | INTRAMUSCULAR | Status: DC | PRN
  Administered 2014-12-04 (×5): 2 mg via INTRAVENOUS
  Administered 2014-12-06 – 2014-12-10 (×3): 4 mg via INTRAVENOUS
  Filled 2014-12-04 (×8): qty 1

## 2014-12-04 MED ORDER — MUPIROCIN 2 % EX OINT
1.0000 "application " | TOPICAL_OINTMENT | Freq: Two times a day (BID) | CUTANEOUS | Status: AC
  Administered 2014-12-04 – 2014-12-08 (×10): 1 via NASAL
  Filled 2014-12-04 (×5): qty 22

## 2014-12-04 MED ORDER — CEFTRIAXONE SODIUM 1 G IJ SOLR: 1.0000 g | Freq: Two times a day (BID) | INTRAMUSCULAR | Status: DC

## 2014-12-04 MED ORDER — SODIUM CHLORIDE 0.9 % IV SOLN
1000.0000 mg | Freq: Two times a day (BID) | INTRAVENOUS | Status: DC
  Administered 2014-12-04 (×3): 1000 mg via INTRAVENOUS
  Filled 2014-12-04 (×2): qty 10
  Filled 2014-12-04: qty 5
  Filled 2014-12-04: qty 10

## 2014-12-04 MED ORDER — SODIUM CHLORIDE 0.9 % IV SOLN
INTRAVENOUS | Status: AC
  Administered 2014-12-04: 50 mL/h via INTRAVENOUS

## 2014-12-04 NOTE — Consult Note (Signed)
Has resolved  Name: Michael [name] MRN: [id number] DOB: 11-23-1955    ADMISSION DATE:  12/03/2014 CONSULTATION DATE:  12/04/2014  REFERRING MD :  Charlies Silvers MD  CHIEF COMPLAINT:  Altered mental status  HISTORY OF PRESENT ILLNESS:  59 year old man with chronic pancreatitis from alcohol abuse. He was a type II diabetic but seems to have download pancreatic insufficiency and now requires insulin. He has a history of seizures related to hypoglycemia and alcohol withdrawal in the past. I note an admission in late October to internal medicine teaching service for failure to thrive and 30 pound weight loss, MRI did not demonstrate any malignancy in the pancreas. He was admitted on 11/10 after being found unresponsive at home .he lives with his sister and niece .his blood sugar was elevated to 1200 without anion gap , lactate was elevated to 9 , he was given fluids and placed on empiric antibiotics for meningitis . CSF exam shows hardly any white blood cells , normal protein and high sugar . We're consulted due to persistent fluctuating mental status -he becomes too sedated and given both Haldol and Ativan and is quite agitated otherwise . A femoral central line was placed in the ED    SIGNIFICANT EVENTS  11/10-lumbar puncture   STUDIES:   11/10 head CT >> atrophy     PAST MEDICAL HISTORY :   has a past medical history of Chronic pancreatitis; Gastritis due to alcohol without hemorrhage; HTN (hypertension); Pancreatic lesion; Heavy alcohol use; Depression; Helicobacter pylori gastritis (03/27/2011); Insomnia; ACE inhibitor-aggravated angioedema; Sessile colonic polyp; GERD (gastroesophageal reflux disease); Chronic abdominal pain on narcotics (06/27/2012); Hyperlipidemia; Type II diabetes mellitus ([place]); Migraine; Seizures ([place]); Chronic back pain; and Bipolar disorder ([place]).  has past surgical history that includes Esophagogastroduodenoscopy (03/22/2011); Balloon dilation (03/22/2011); EUS (04/20/2011);  Colonoscopy w/ biopsies and polypectomy (09/12/11); and Colonoscopy (N/A, 05/22/2012). Prior to Admission medications   Medication Sig Start Date End Date Taking? Authorizing Provider  acetaminophen (TYLENOL) 500 MG tablet Take 1 tablet (500 mg total) by mouth every 6 (six) hours as needed. 11/25/14 11/25/15 Yes Norval Gable, MD  aspirin EC 81 MG tablet Take 81 mg by mouth at bedtime.   Yes Historical Provider, MD  FLUoxetine (PROZAC) 20 MG capsule Take 20 mg by mouth 2 (two) times daily.    Yes Historical Provider, MD  folic acid (FOLVITE) 1 MG tablet TK 1 T PO D 10/15/14  Yes Historical Provider, MD  gabapentin (NEURONTIN) 300 MG capsule Take 1 capsule (300 mg total) by mouth 3 (three) times daily. 10/23/14  Yes Juluis Mire, MD  Insulin Detemir (LEVEMIR FLEXTOUCH) 100 UNIT/ML Pen Inject 15 units daily before bedtime 11/26/14  Yes Marjan Rabbani, MD  lipase/protease/amylase (CREON) 36000 UNITS CPEP capsule Take 1 capsule (36,000 Units total) by mouth 3 (three) times daily. 11/19/14  Yes Iline Oven, MD  lovastatin (MEVACOR) 40 MG tablet Take 1 tablet (40 mg total) by mouth at bedtime. 03/20/14  Yes Jerrye Noble, MD  magnesium oxide (MAG-OX) 400 (241.3 MG) MG tablet Take 1 tablet (400 mg total) by mouth daily. 10/15/14  Yes Shela Leff, MD  metFORMIN (GLUCOPHAGE) 500 MG tablet TK 2 TS PO BID WITH A MEAL 10/15/14  Yes Historical Provider, MD  Multiple Vitamin (MULTIVITAMIN WITH MINERALS) TABS tablet Take 1 tablet by mouth daily. 11/19/14  Yes Iline Oven, MD  Vitamin D, Ergocalciferol, (DRISDOL) 50000 UNITS CAPS capsule Take 1 capsule (50,000 Units total) by mouth every 7 (seven) days. 11/30/14  Yes Juluis Mire, MD  ACCU-CHEK SOFTCLIX LANCETS lancets Check blood sugar up to 3 times a day 11/26/14   Juluis Mire, MD  Blood Glucose Monitoring Suppl (ACCU-CHEK AVIVA PLUS) W/DEVICE KIT Check blood sugar up to 3 times a day 11/26/14   Juluis Mire, MD  feeding supplement, GLUCERNA  SHAKE, (GLUCERNA SHAKE) LIQD Take 237 mLs by mouth 3 (three) times daily with meals. Patient not taking: Reported on 12/03/2014 11/19/14   Iline Oven, MD  glucose blood (ACCU-CHEK AVIVA PLUS) test strip Check blood sugar up to 3 times a day 11/26/14   Juluis Mire, MD  ibuprofen (ADVIL,MOTRIN) 800 MG tablet Take 1 tablet (800 mg total) by mouth every 8 (eight) hours as needed. 10/30/14   Juluis Mire, MD  Insulin Pen Needle 31G X 5 MM MISC Use to inject 15 units of Levemir one time a day 11/26/14   Juluis Mire, MD  Insulin Syringe-Needle U-100 (B-D INSULIN SYRINGE) 31G X 5/16" 0.3 ML MISC Use to inject insulin into the skin at bedtime each day. diag code E11.40. Insulin dependent 11/25/14   Norval Gable, MD  ondansetron (ZOFRAN) 4 MG tablet Take 1 tablet (4 mg total) by mouth every 6 (six) hours. Patient taking differently: Take 4 mg by mouth every 8 (eight) hours as needed for nausea.  05/14/14   Jerrye Noble, MD  pantoprazole (PROTONIX) 40 MG tablet Take 1 tablet (40 mg total) by mouth daily. Patient not taking: Reported on 12/03/2014 02/07/13   Jerrye Noble, MD   Allergies  Allergen Reactions  . Ace Inhibitors Swelling    Angioedema - unquestionable  . Losartan Swelling    Pt presented with unquestionable angioedema on ACEI, so aviod ARBs as well    FAMILY HISTORY:  family history includes Diabetes in his sister; Heart disease in his sister; Hypertension in his sister. There is no history of Colon cancer, Stomach cancer, Anesthesia problems, Hypotension, Pseudochol deficiency, or Malignant hyperthermia. SOCIAL HISTORY:  reports that he has been smoking Cigarettes.  He has a 24 pack-year smoking history. He has never used smokeless tobacco. He reports that he drinks about 6.0 oz of alcohol per week. He reports that he uses illicit drugs (Marijuana).  REVIEW OF SYSTEMS:   Unable to obtain due to altered mental status   SUBJECTIVE:   VITAL SIGNS: Temp:  [96.8 F (36  C)-101.8 F (38.8 C)] 96.8 F (36 C) (11/11 0800) Pulse Rate:  [82-129] 93 (11/11 1000) Resp:  [15-26] 18 (11/11 1000) BP: (75-145)/(53-105) 108/84 mmHg (11/11 1000) SpO2:  [95 %-100 %] 100 % (11/11 1000)  PHYSICAL EXAMINATION: Gen.  well-nourished, in mild distress, confused ENT - no lesions, no post nasal drip, no neck stiffness Neck: No JVD, no thyromegaly, no carotid bruits Lungs: no use of accessory muscles, no dullness to percussion, clear without rales or rhonchi  Cardiovascular: Rhythm regular, heart sounds  normal, no murmurs, no peripheral edema Abdomen: soft and non-tender, no hepatosplenomegaly, BS normal. Musculoskeletal: No deformities, no cyanosis or clubbing Neuro:  Oriented to name, non focal, follows one-step commands, constant muttering Skin:  Warm, no lesions/ rash    Recent Labs Lab 12/03/14 2350 12/04/14 0500 12/04/14 0945  NA 147* 145 145  K 2.0* 3.3* 3.0*  CL 112* 111 111  CO2 26 29 27   BUN <5* <5* <5*  CREATININE 0.69 0.53* 0.55*  GLUCOSE 334* 79 274*    Recent Labs Lab 12/03/14 1858 12/03/14 1923 12/04/14 0500  HGB 17.6* 19.4*  13.4  HCT 49.0 57.0* 36.2*  WBC 11.4*  --  15.3*  PLT 211  --  184   Dg Chest 1 View  12/03/2014  CLINICAL DATA:  Altered mental status tonight. EXAM: CT HEAD WITHOUT CONTRAST TECHNIQUE: Contiguous axial images were obtained from the base of the skull through the vertex without intravenous contrast. COMPARISON:  10/09/2014 FINDINGS: Stable age advanced cerebral atrophy, ventriculomegaly and periventricular white matter disease. No extra-axial fluid collections are identified. No CT findings for acute hemispheric infarction or intracranial hemorrhage. No mass lesions. The brainstem and cerebellum are normal. No acute bony findings. Stable left maxillary sinus wall thickening. The globes are intact. IMPRESSION: Stable age advanced cerebral atrophy, ventriculomegaly and periventricular white matter disease. No acute  intracranial findings or mass lesions. Electronically Signed   By: Marijo Sanes M.D.   On: 12/03/2014 19:37   Ct Head Wo Contrast  12/03/2014  CLINICAL DATA:  Altered mental status tonight. EXAM: CT HEAD WITHOUT CONTRAST TECHNIQUE: Contiguous axial images were obtained from the base of the skull through the vertex without intravenous contrast. COMPARISON:  10/09/2014 FINDINGS: Stable age advanced cerebral atrophy, ventriculomegaly and periventricular white matter disease. No extra-axial fluid collections are identified. No CT findings for acute hemispheric infarction or intracranial hemorrhage. No mass lesions. The brainstem and cerebellum are normal. No acute bony findings. Stable left maxillary sinus wall thickening. The globes are intact. IMPRESSION: Stable age advanced cerebral atrophy, ventriculomegaly and periventricular white matter disease. No acute intracranial findings or mass lesions. Electronically Signed   By: Marijo Sanes M.D.   On: 12/03/2014 19:37   Dg Chest Port 1 View  12/04/2014  CLINICAL DATA:  Altered mental status. EXAM: PORTABLE CHEST 1 VIEW COMPARISON:  Yesterday FINDINGS: EKG leads and wires create artifact. More coarsened appearance of the perihilar interstitial marking which is likely from lower lung volumes. No overt interstitial edema, effusion, or pneumothorax. Normal heart size and mediastinal contours. IMPRESSION: Newly coarsened interstitial markings is likely from lower volumes, but cannot exclude developing volume overload/edema. Electronically Signed   By: Monte Fantasia M.D.   On: 12/04/2014 05:53    ASSESSMENT / PLAN:    Acute encephalopathy - unclear etiology, serious of exam is negative making meningitis or encephalitis quite unlikely. His sister states that he has not consumed alcohol in 1 month. His sugars have decreased & hyperosmolar state  has resolved. Imaging is negative for acute event. He does carry a history of bipolar disorder-but I do not see any  antipsychotics on his medication list  Recommend- will use IV Haldol when necessary We'll use Ativan only if history of active alcohol consumption is confirmed from the sister. He does seem that the Ativan makes him too obtunded If he has breakthrough in spite of these medications, then Precedex can be used We'll obtain EEG to rule out unwitnessed seizure Antibodies can be stopped-since no evidence of meningitis, urine analysis is clear as well as chest x-ray, can continue ceftriaxone for presumed aspiration for another 24 hours   hyperosmolar nonketotic state -per primary service   Hypomagnesemia-replete  Kara Mead MD. Shade Flood. [facility] Pager [phone] If no response call [phone]    12/04/2014, 11:12 AM

## 2014-12-04 NOTE — Progress Notes (Signed)
[facility] Progress Note Patient Name: Michael [name] DOB: 01-28-1955 MRN: [id number]   Date of Service  12/04/2014  HPI/Events of Note  hypok , phosm ag  eICU Interventions  supp additional     Intervention Category Major Interventions: Electrolyte abnormality - evaluation and management  Ashli Selders J. 12/04/2014, 1:10 AM

## 2014-12-04 NOTE — Progress Notes (Addendum)
PHARMACY NOTE -  Ceftriaxone   Pharmacy has been assisting with dosing of Rocephin for r/o of meningitis, now aspiration pneumonia.    Dosage now remains stable at 1 g IV q24 hr and need for further dosage adjustment appears unlikely at present.    Will sign off at this time.  Please reconsult if a change in clinical status warrants re-evaluation of dosage.   Reuel Boom, PharmD, BCPS Pager: [phone] 12/04/2014, 11:23 AM

## 2014-12-04 NOTE — Care Management Note (Signed)
Case Management Note  Patient Details  Name: JAYMZ MADDALONI MRN: [id number] Date of Birth: Dec 27, 1955  Subjective/Objective:            Diabetes dka        Action/Plan:Date: December 04, 2014 Chart reviewed for concurrent status and case management needs. Will continue to follow patient for changes and needs: Velva Harman, RN, BSN, [place]   [phone]   Expected Discharge Date:   (UNKNOWN)               Expected Discharge Plan:  Home/Self Care  In-House Referral:  NA  Discharge planning Services  CM Consult  Post Acute Care Choice:  NA Choice offered to:  NA  DME Arranged:    DME Agency:     HH Arranged:    HH Agency:     Status of Service:  In process, will continue to follow  Medicare Important Message Given:    Date Medicare IM Given:    Medicare IM give by:    Date Additional Medicare IM Given:    Additional Medicare Important Message give by:     If discussed at [place] of Stay Meetings, dates discussed:    Additional Comments:  Leeroy Cha, RN 12/04/2014, 10:22 AM

## 2014-12-04 NOTE — Progress Notes (Signed)
I was alerted by another RN that patient was attempting to get out of bed, cursing, not redirectable, and pulling at lines. When I entered the room, sitter was standing beside patient, holding his left arm to prevent him from pulling on his femoral line and attempting to keep him in the bed. The patient was oriented only to self and he was not making sense with his speech. The patient had safety mitts on both hands, but was still pulling.  Patients behaviors continued to escalate and neither the sitter, myself, or second RN were able to redirect the patient. PRN medications for anxiety were given IV. After 30 minutes the patients behavior continued to escalate, despite PRN medication. IV precedex was then started. Patient responded appropriately to precedex and is now resting comfortably, sitter remains at bedside. Will continue to monitor. Luther Parody, RN

## 2014-12-04 NOTE — Progress Notes (Signed)
Initial Nutrition Assessment  DOCUMENTATION CODES:   Severe malnutrition in context of chronic illness  INTERVENTION:  Glucerna Shake po TID, each supplement provides 220 kcal and 10 grams of protein  Pt is at risk of refeeding, monitor Mg, Phos, K, replete per MD.  NUTRITION DIAGNOSIS:   Malnutrition related to chronic illness (alcoholism, uncontrolled diabetes.) as evidenced by severe depletion of muscle mass, severe depletion of body fat.   GOAL:   Patient will meet greater than or equal to 90% of their needs  MONITOR:   PO intake, I & O's, Labs, Supplement acceptance  REASON FOR ASSESSMENT:   Malnutrition Screening Tool    ASSESSMENT:   Garner Dullea is a 59 y.o. male, with past medical history significant for uncontrolled insulin dependent diabetes mellitus , bipolar disorder, and chronic pancreatitis brought by EMS after he was found unresponsive. He was seen last a week early in the morning today. He lives with his sister and niece and they found him today unresponsive at home. In the emergency room the patient was noted to be febrile with elevated lactic acid level. His blood sugar was elevated and his anion gap was not significantly elevated. LP was done and is still pending and the patient was started on broad-spectrum antibiotics in addition to acyclovir to cover for viral meningitis.  Spoke with pt at bedside. Pt exhibits AMS, but could answer some questions.  Admits to so-so appetite, fluctuates with how he feels, likely pertaining to uncontrolled diabetes. Admits he was eating well PTA, but was not utilizing insulin appropriately. Likely resulted in possible DKA.  Pt reports  Approximately 40# wt loss, with UBW around 210#  Per chart review, pt has severe wt loss of 41#/26% of in 6 months.   Per chart review, pt met with CDE in previous admission to help control DM. Does not appear further education would be needed, as compliance appears to be  poor.  Nutrition-Focused physical exam completed. Findings are sev fat depletion, sev muscle depletion, and no edema.   K 3, Phos 1.1, Mg WNL, CBG 202. Monitor labs per refeeding.  Diet Order:  Diet Heart Room service appropriate?: Yes; Fluid consistency:: Thin  Skin:  Reviewed, no issues  Last BM:  PTA  Height:   Ht Readings from Last 1 Encounters:  11/17/14 _0  (1.626 m)    Weight:   Wt Readings from Last 1 Encounters:  11/25/14 132 lb 4.8 oz (60.011 kg)    Ideal Body Weight:     BMI:  There is no weight on file to calculate BMI.  Estimated Nutritional Needs:   Kcal:  1800-2000  Protein:  80-95 grams  Fluid:  1.8 - 2L  EDUCATION NEEDS:   No education needs identified at this time  Satira Anis. Ruthellen Tippy, MS, RD LDN After Hours/Weekend Pager [phone]

## 2014-12-04 NOTE — Progress Notes (Addendum)
Patient ID: Michael [name], male   DOB: [birth year], 57 y.o.   MRN: [id number] [facility] PROGRESS NOTE  Michael [name]:[id number] DOB: 09/17/1955 DOA: 12/03/2014 PCP: Michael Mire, MD  Brief narrative:    58 y.o. male, with past medical history significant for uncontrolled insulin dependent diabetes mellitus , bipolar disorder, chronic pancreatitis, previous history of drug abuse (THC, opiates), alcohol abuse. Patient was found unresponsive by EMS. Apparently he lives with his sister and a niece. They report that his last alcoholic beverage was about a month ago. Patient is altered and is not able to provide history of present illness. On admission, the thought was that patient possibly has viral or bacterial meningitis considering fever, tachycardia, tachypnea, leukocytosis. Lumbar puncture performed, CSF analysis not remarkable for acute infection. Chest x-ray and urinalysis were unremarkable. CT head showed no acute intracranial findings. Alcohol level is within normal limits. UDS was unremarkable.  Blood work was notable for white blood cell count of 11.4, lactic acid of 3.31, potassium of 2, sodium 147, normal creatinine. Additionally, CBG was more than 600, glucose on BMP was more than 700. Ketones were not present in urine. He was started on insulin drip for hyperosmolar hyperglycemia. He was very restless since the admission and was started on CIWA protocol. Critical care has seen him in consultation.  Assessment/Plan:    Principal problem: Acute encephalopathy / history of alcohol abuse / possible acute alcohol withdrawal with delirium - Patient has history of alcohol abuse although per his family report last alcoholic beverage about a month ago. - Blood alcohol level was within normal limits. - UDS unremarkable - The thought was that patient may also have acute bacterial or viral meningitis. Lumbar puncture was performed at the time of the admission. CSF is unremarkable. The  antibiotics were started on the admission, acyclovir, vancomycin, Rocephin. Will stop acyclovir and vancomycin. We will continue Rocephin for now to treat for possible pneumonia.  - Pt is on CIWA protocol. Still restless. May need precedex. - Appreciate PCCM recommendations  - Please note patient also given Keppra for possible seizures. Will place order for EEG. - Continue to monitor in step down unit  Active Problems: Hyperosmolar hyperglycemic non ketotic state ([place]) / uncontrolled diabetes mellitus without complications with long-term insulin use - Patient had CBG of more than 600 on the admission, glucose on basic metabolic panel was more than 700. Ketones were not present in urine. He was started on insulin drip.  - Anion gap is within normal limits. - CBGs in past 24 hours are 122, 112, 108. Give Levemir 5 units once and start sliding insulin scale. Stop insulin drip 2 hours from the time Levemir given. - Diabetic coordinator consulted - Recent A1c in 10/2014 was 11.8 indicating poor glycemic control.  Sepsis due to unspecified organism / leukocytosis / lactic acidosis - Sepsis criteria met on the admission with fever of 101.55F, tachycardia, tachypnea, hypotension, lactic acidosis - As mentioned above, initial thought was that patient has acute bacterial or viral meningitis. A lumbar puncture was done on the admission but CSF is unremarkable. - Acyclovir and vancomycin were stopped. We will continue empiric Rocephin for now. - White blood cell count is 15.3. - Continue to monitor in step down unit - Patient is hemodynamically stable and he does not require pressor support.  Hypernatremia - Likely secondary to dehydration - Sodium normalized with IV fluids  Hypokalemia - Likely due to altered mental status and poor nutritional status - Being supplemented.  DVT Prophylaxis  - Heparin subQ ordered   Code Status: Full.  Family Communication:  plan of care discussed with the  patient Disposition Plan: remains in SDU, may need precedex drip if continues to be restless, agitated.   IV access:  Peripheral IV  Procedures and diagnostic studies:    Dg Chest 1 View 12/03/2014  Stable age advanced cerebral atrophy, ventriculomegaly and periventricular white matter disease. No acute intracranial findings or mass lesions. Electronically Signed   By: Michael [name] M.D.   On: 12/03/2014 19:37   Ct Head Wo Contrast 12/03/2014   Stable age advanced cerebral atrophy, ventriculomegaly and periventricular white matter disease. No acute intracranial findings or mass lesions. Electronically Signed   By: Michael [name] M.D.   On: 12/03/2014 19:37   Dg Chest Port 1 View 12/04/2014   Newly coarsened interstitial markings is likely from lower volumes, but cannot exclude developing volume overload/edema. Electronically Signed   By: Michael [name] M.D.   On: 12/04/2014 05:53    Medical Consultants:  None   Other Consultants:  None   IAnti-Infectives:   Acyclovir, rocephin, vancomycin, ampicillin. Started on admission and stopped 11/1 except for rocephin. Rocephin 12/03/2014 -->     Michael Lenz, MD  [facility] Pager [phone]  Time spent in minutes: 25 minutes  If 7PM-7AM, please contact night-coverage [email or web address] Password [email or web address] 12/04/2014, 11:00 AM   LOS: 1 day    HPI/Subjective: No acute overnight events. Patient disoriented. Apparently very restless during the night.  Objective: Filed Vitals:   12/04/14 0500 12/04/14 0600 12/04/14 0800 12/04/14 1000  BP: 75/56 112/88 97/65 108/84  Pulse: 91 93 82 93  Temp:   96.8 F (36 C)   TempSrc:   Axillary   Resp: 18 15 16 18   SpO2: 100% 100% 100% 100%    Intake/Output Summary (Last 24 hours) at 12/04/14 1100 Last data filed at 12/04/14 0900  Gross per 24 hour  Intake 3669.24 ml  Output   2960 ml  Net 709.24 ml    Exam:   General:  Pt is awake, disoriented   Cardiovascular: tachycardic, S1/S2  appreciated   Respiratory: No wheezing, no crackles, no rhonchi  Abdomen: Soft, non tender, non distended, bowel sounds present  Extremities: No edema, pulses DP and PT palpable bilaterally  Neuro: Grossly nonfocal  Data Reviewed: Basic Metabolic Panel:  Recent Labs Lab 12/03/14 1858 12/03/14 1923 12/03/14 2136 12/03/14 2350 12/04/14 0500 12/04/14 0945  NA 129* 129* 140 147* 145 145  K 2.8* 3.7 2.6* 2.0* 3.3* 3.0*  CL 89* 89* 99* 112* 111 111  CO2 21*  --  28 26 29 27   GLUCOSE 1221* >700* 786* 334* 79 274*  BUN <5* <3* <5* <5* <5* <5*  CREATININE 1.01 0.70 0.87 0.69 0.53* 0.55*  CALCIUM 9.8  --  9.7 8.8* 8.3* 8.2*  MG  --   --   --  1.9  --   --   PHOS  --   --   --  1.1*  --   --    Liver Function Tests:  Recent Labs Lab 12/03/14 1858 12/04/14 0500  AST 33 17  ALT 36 23  ALKPHOS 205* 117  BILITOT 0.5 0.8  PROT 7.3 5.3*  ALBUMIN 4.1 2.8*    Recent Labs Lab 12/03/14 1858  LIPASE 22   No results for input(s): AMMONIA in the last 168 hours. CBC:  Recent Labs Lab 12/03/14 1858 12/03/14 1923 12/04/14 0500  WBC 11.4*  --  15.3*  NEUTROABS 10.3*  --   --   HGB 17.6* 19.4* 13.4  HCT 49.0 57.0* 36.2*  MCV 92.3  --  89.8  PLT 211  --  184   Cardiac Enzymes:  Recent Labs Lab 12/03/14 1858  TROPONINI <0.03   BNP: Invalid input(s): POCBNP CBG:  Recent Labs Lab 12/04/14 0347 12/04/14 0427 12/04/14 0546 12/04/14 0636 12/04/14 0807  GLUCAP 122* 112* 108* 133* 202*    CSF culture     Status: None (Preliminary result)   Collection Time: 12/03/14  9:39 PM  Result Value Ref Range Status   Specimen Description CSF  Final   Special Requests NONE  Final   Gram Stain   Final    CYTOSPIN WBC PRESENT, PREDOMINANTLY PMN NO ORGANISMS SEEN    Culture PENDING  Incomplete   Report Status PENDING  Incomplete  MRSA PCR Screening     Status: Abnormal   Collection Time: 12/03/14 10:46 PM  Result Value Ref Range Status   MRSA by PCR POSITIVE (A)  NEGATIVE Final     Scheduled Meds: .  cefTRIAXone   1 g Intravenous Q24H  . feeding supplement   237 mL Oral BID BM  . folic acid  1 mg Oral Daily  . heparin subcutaneous  5,000 Units Subcutaneous 3 times per day  . insulin aspart  0-15 Units Subcutaneous TID WC  . insulin aspart  0-5 Units Subcutaneous QHS  . levETIRAcetam  1,000 mg Intravenous BID  . LORazepam  0-4 mg Intravenous Q6H  . multivitamin   1 tablet Oral Daily  . mupirocin ointment  1 application Nasal BID  . thiamine  100 mg Oral Daily  . ziprasidone  20 mg Intramuscular Once   Continuous Infusions: . sodium chloride    . dexmedetomidine    . insulin (NOVOLIN-R) infusion Stopped (12/04/14 0900)

## 2014-12-04 NOTE — Progress Notes (Signed)
[facility]  Patient Status: Active (receiving services up to time of hospitalization)  [facility] is providing the following services: RN  If patient discharges after hours, please call [phone].   Lurlean Leyden 12/04/2014, 1:55 PM

## 2014-12-04 NOTE — Progress Notes (Addendum)
Inpatient Diabetes Program Recommendations  AACE/ADA: New Consensus Statement on Inpatient Glycemic Control (2015)  Target Ranges:  Prepandial:   less than 140 mg/dL      Peak postprandial:   less than 180 mg/dL (1-2 hours)      Critically ill patients:  140 - 180 mg/dL    Results for OMA, SKUFCA (MRN [id number]) as of 12/04/2014 08:18  Ref. Range 03/20/2014 10:39 06/12/2014 10:40 10/09/2014 04:15 11/17/2014 18:24  Hemoglobin A1C Latest Ref Range: 4.8-5.6 % 8.5 7.3 6.8 (H) 11.8 (H)    Admit with: Extreme Hyperglycemia/ Unresponsive at Home  History: DM, Chronic Pancreatitis, ETOH Abuse, Bipolar disorder, Seizures from Hypoglycemia  Home DM Meds: Levemir 15 units QHS       Metformin 500 mg bid  Current Insulin Orders: IV Insulin drip     -Patient with frequent hospitalizations.  Counseled by DM Coordinator during past hospitalizations.  -Note Glucose 1221 mg/dl, CO2 21, Anion Gap 19 on admission.  Given IVF boluses and started on IV insulin drip in the ED.  -Glucose levels have stabilized this AM.  Planning to transition patient off the IV insulin drip this AM to Levemir 5 units + Novolog Moderate SSI.  -Through Chart Review, note that patient saw his PCP (Dr. Merrilyn Puma with the [facility]) on 11/25/14.  Dr. Merrilyn Puma stated in his notes that they would like to avoid starting patient on short-acting insulin at home due to patient's high risk for Hypoglycemic seizures.  Per Dr. Audrea Muscat notes, they would like to keep patient's A1c around 8-9% with more liberal control at home.    --Will follow patient during hospitalization--  Wyn Quaker RN, MSN, CDE Diabetes Coordinator Inpatient Glycemic Control Team Team Pager: [phone] (8a-5p)

## 2014-12-04 NOTE — Progress Notes (Signed)
EEG completed, results pending. 

## 2014-12-04 NOTE — Procedures (Signed)
ELECTROENCEPHALOGRAM REPORT   Patient: Michael [name]       Room #: [id number]  Age: 59 y.o.        Sex: male Referring Physician: Charlies Silvers Report Date:  12/04/2014        Interpreting Physician: Alexis Goodell  History: [name] is an 59 y.o. male found unresponsive   Medications:  Scheduled: . antiseptic oral rinse  7 mL Mouth Rinse q12n4p  . [START ON 12/05/2014] cefTRIAXone (ROCEPHIN)  IV  1 g Intravenous Q24H  . chlorhexidine  15 mL Mouth Rinse BID  . Chlorhexidine Gluconate Cloth  6 each Topical Q0600  . feeding supplement (GLUCERNA SHAKE)  237 mL Oral TID BM  . folic acid  1 mg Oral Daily  . heparin subcutaneous  5,000 Units Subcutaneous 3 times per day  . insulin aspart  0-15 Units Subcutaneous TID WC  . insulin aspart  0-5 Units Subcutaneous QHS  . levETIRAcetam  1,000 mg Intravenous BID  . LORazepam  0-4 mg Intravenous Q6H   Followed by  . [START ON 12/06/2014] LORazepam  0-4 mg Intravenous Q12H  . multivitamin with minerals  1 tablet Oral Daily  . mupirocin ointment  1 application Nasal BID  . potassium phosphate IVPB (mmol)  30 mmol Intravenous Once  . sodium chloride  3 mL Intravenous Q12H  . thiamine  100 mg Oral Daily   Or  . thiamine  100 mg Intravenous Daily    Conditions of Recording:  This is a 16 channel EEG carried out with the patient in the awake and asleep states.  Patient is uncooperative.  Description:  The waking background activity consists of low to moderate voltage, poorly organized slow activity.  This activity is in the delta range and frequently is superimposed with motion artifact.  It is persistent until the patient goes to sleep at which time evidence of stage II sleep is noted. During stage II sleep there was noted symmetrical sleep spindles, vertex central sharp transients and irregular slow activity.  No epileptiform activity is noted.   Hyperventilation and intermittent photic stimulation were not performed.   IMPRESSION: This EEG  is characterized by slowing during wakefulness which is consistent with normal drowse.  Normal sleep activity is noted.  Can not rule out the possibility of slowing related to general cerebral disturbance such as a metabolic encephalopathy.  Clinical correlation recommended.  No epileptiform activity is noted.       Alexis Goodell, MD [facility] [phone] 12/04/2014, 3:24 PM

## 2014-12-04 NOTE — Progress Notes (Signed)
ANTIBIOTIC CONSULT NOTE - INITIAL  Pharmacy Consult for Ceftriaxone Indication: rule out sepsis, rule out meningitis  Allergies  Allergen Reactions  . Ace Inhibitors Swelling    Angioedema - unquestionable  . Losartan Swelling    Pt presented with unquestionable angioedema on ACEI, so aviod ARBs as well    Patient Measurements:   Adjusted Body Weight:   Vital Signs: Temp: 99.7 F (37.6 C) (11/11 0128) Temp Source: Axillary (11/11 0128) BP: 123/66 mmHg (11/11 0100) Pulse Rate: 115 (11/11 0128) Intake/Output from previous day: 11/10 0701 - 11/11 0700 In: -  Out: 1700 [Urine:1700] Intake/Output from this shift: Total I/O In: -  Out: 1700 [Urine:1700]  Labs:  Recent Labs  12/03/14 1858 12/03/14 1923 12/03/14 2136 12/03/14 2350  WBC 11.4*  --   --   --   HGB 17.6* 19.4*  --   --   PLT 211  --   --   --   CREATININE 1.01 0.70 0.87 0.69   Estimated Creatinine Clearance: 85.3 mL/min (by C-G formula based on Cr of 0.69). No results for input(s): VANCOTROUGH, VANCOPEAK, VANCORANDOM, GENTTROUGH, GENTPEAK, GENTRANDOM, TOBRATROUGH, TOBRAPEAK, TOBRARND, AMIKACINPEAK, AMIKACINTROU, AMIKACIN in the last 72 hours.   Microbiology: Recent Results (from the past 720 hour(s))  Culture, blood (routine x 2)     Status: None   Collection Time: 11/18/14  8:25 AM  Result Value Ref Range Status   Specimen Description BLOOD RIGHT HAND  Final   Special Requests BOTTLES DRAWN AEROBIC AND ANAEROBIC 10CC  Final   Culture  Setup Time   Final    GRAM POSITIVE COCCI IN CLUSTERS ANAEROBIC BOTTLE ONLY CRITICAL RESULT CALLED TO, READ BACK BY AND VERIFIED WITH: C GLESON 11/19/14 @ 0952 M VESTAL    Culture   Final    STAPHYLOCOCCUS SPECIES (COAGULASE NEGATIVE) THE SIGNIFICANCE OF ISOLATING THIS ORGANISM FROM A SINGLE SET OF BLOOD CULTURES WHEN MULTIPLE SETS ARE DRAWN IS UNCERTAIN. PLEASE NOTIFY THE MICROBIOLOGY DEPARTMENT WITHIN ONE WEEK IF SPECIATION AND SENSITIVITIES ARE REQUIRED.    Report  Status 11/21/2014 FINAL  Final  Culture, blood (routine x 2)     Status: None   Collection Time: 11/18/14  8:29 AM  Result Value Ref Range Status   Specimen Description BLOOD LEFT HAND  Final   Special Requests BOTTLES DRAWN AEROBIC AND ANAEROBIC 10CC  Final   Culture NO GROWTH 5 DAYS  Final   Report Status 11/23/2014 FINAL  Final  CSF culture     Status: None (Preliminary result)   Collection Time: 12/03/14  9:39 PM  Result Value Ref Range Status   Specimen Description CSF  Final   Special Requests NONE  Final   Gram Stain   Final    CYTOSPIN WBC PRESENT, PREDOMINANTLY PMN NO ORGANISMS SEEN Gram Stain Report Called to,Read Back By and Verified With: A CHAVIS RN 2255 12/03/14 A NAVARRO    Culture PENDING  Incomplete   Report Status PENDING  Incomplete  MRSA PCR Screening     Status: Abnormal   Collection Time: 12/03/14 10:46 PM  Result Value Ref Range Status   MRSA by PCR POSITIVE (A) NEGATIVE Final    Comment:        The GeneXpert MRSA Assay (FDA approved for NASAL specimens only), is one component of a comprehensive MRSA colonization surveillance program. It is not intended to diagnose MRSA infection nor to guide or monitor treatment for MRSA infections. RESULT CALLED TO, READ BACK BY AND VERIFIED WITH: REEVES,M/2W @0040   ON 12/04/14 BY KARCZEWSKI,S.     Medical History: Past Medical History  Diagnosis Date  . Chronic pancreatitis   . Gastritis due to alcohol without hemorrhage   . HTN (hypertension)   . Pancreatic lesion     appears innocent and related to chronic pancreatitis  . Heavy alcohol use     hx of  . Depression   . Helicobacter pylori gastritis 03/27/2011    on endoscopy, treated with omeprazole, clarithromycin and amoxicillin  . Insomnia   . ACE inhibitor-aggravated angioedema   . Sessile colonic polyp   . GERD (gastroesophageal reflux disease)   . Chronic abdominal pain on narcotics 06/27/2012  . Hyperlipidemia   . Type II diabetes mellitus ([place])    . Migraine     "couple times/year" (11/17/2014)  . Seizures ([place])     "last one was ~ 3 wks ago" (11/17/2014)  . Chronic back pain   . Bipolar disorder ([place])     Medications:  Anti-infectives    Start     Dose/Rate Route Frequency Ordered Stop   12/04/14 0800  cefTRIAXone (ROCEPHIN) 2 g in dextrose 5 % 50 mL IVPB     2 g 100 mL/hr over 30 Minutes Intravenous Every 12 hours 12/03/14 1927     12/04/14 0600  vancomycin (VANCOCIN) 500 mg in sodium chloride 0.9 % 100 mL IVPB     500 mg 100 mL/hr over 60 Minutes Intravenous Every 8 hours 12/03/14 1933     12/04/14 0300  metroNIDAZOLE (FLAGYL) IVPB 500 mg  Status:  Discontinued     500 mg 100 mL/hr over 60 Minutes Intravenous Every 6 hours 12/03/14 1927 12/03/14 1937   12/04/14 0000  ampicillin (OMNIPEN) 2 g in sodium chloride 0.9 % 50 mL IVPB     2 g 150 mL/hr over 20 Minutes Intravenous 6 times per day 12/03/14 1938     12/03/14 2300  acyclovir (ZOVIRAX) 600 mg in dextrose 5 % 100 mL IVPB     10 mg/kg  60 kg 112 mL/hr over 60 Minutes Intravenous 3 times per day 12/03/14 2151     12/03/14 1945  ampicillin (OMNIPEN) 2 g in sodium chloride 0.9 % 50 mL IVPB     2 g 150 mL/hr over 20 Minutes Intravenous STAT 12/03/14 1938 12/03/14 2336   12/03/14 1930  vancomycin (VANCOCIN) IVPB 1000 mg/200 mL premix     1,000 mg 200 mL/hr over 60 Minutes Intravenous STAT 12/03/14 1926 12/03/14 2244   12/03/14 1930  cefTRIAXone (ROCEPHIN) 2 g in dextrose 5 % 50 mL IVPB     2 g 100 mL/hr over 30 Minutes Intravenous STAT 12/03/14 1926 12/03/14 2014   12/03/14 1930  metroNIDAZOLE (FLAGYL) IVPB 500 mg  Status:  Discontinued     500 mg 100 mL/hr over 60 Minutes Intravenous STAT 12/03/14 1926 12/03/14 1937     Assessment: Patient with rule out sepsis, rule out meningitis.    Goal of Therapy:  Rocephin based on manufacturer dosing recommendations.  Plan: Ceftriaxone 2gm iv q12hr Follow up culture results  Nani Skillern  [place] 12/04/2014,1:52 AM

## 2014-12-04 NOTE — Progress Notes (Signed)
Paged [facility] to report critical value.  Awaiting response.  Spoke with elink/Feinstein.  Notified of critical potassium and multiple IVF orders.  Dr. Titus Mould aware with clarification orders in progress.  Pt VSS, but is not verbal at present.  Pt does not  follow commands but does move all extremities and withdraws to pain.  MDs aware.  Last CBG 336.  Will continue to assess.

## 2014-12-05 ENCOUNTER — Encounter (HOSPITAL_COMMUNITY): Payer: Self-pay | Admitting: Pulmonary Disease

## 2014-12-05 DIAGNOSIS — E872 Acidosis, unspecified: Secondary | ICD-10-CM | POA: Diagnosis present

## 2014-12-05 DIAGNOSIS — E876 Hypokalemia: Secondary | ICD-10-CM | POA: Diagnosis present

## 2014-12-05 DIAGNOSIS — D72829 Elevated white blood cell count, unspecified: Secondary | ICD-10-CM | POA: Diagnosis present

## 2014-12-05 DIAGNOSIS — A419 Sepsis, unspecified organism: Secondary | ICD-10-CM | POA: Diagnosis present

## 2014-12-05 DIAGNOSIS — E1100 Type 2 diabetes mellitus with hyperosmolarity without nonketotic hyperglycemic-hyperosmolar coma (NKHHC): Secondary | ICD-10-CM

## 2014-12-05 DIAGNOSIS — E870 Hyperosmolality and hypernatremia: Secondary | ICD-10-CM | POA: Diagnosis present

## 2014-12-05 LAB — GLUCOSE, CAPILLARY
GLUCOSE-CAPILLARY: 238 mg/dL — AB (ref 65–99)
GLUCOSE-CAPILLARY: 158 mg/dL — AB (ref 65–99)
Glucose-Capillary: 81 mg/dL (ref 65–99)
Glucose-Capillary: 235 mg/dL — ABNORMAL HIGH (ref 65–99)
Glucose-Capillary: 91 mg/dL (ref 65–99)
Glucose-Capillary: 159 mg/dL — ABNORMAL HIGH (ref 65–99)

## 2014-12-05 LAB — BASIC METABOLIC PANEL
Anion gap: 6 (ref 5–15)
Anion gap: 6 (ref 5–15)
BUN: <5 mg/dL — ABNORMAL LOW (ref 6–20)
CALCIUM: 8.3 mg/dL — AB (ref 8.9–10.3)
CHLORIDE: 108 mmol/L (ref 101–111)
CHLORIDE: 112 mmol/L — AB (ref 101–111)
CO2: 27 mmol/L (ref 22–32)
CO2: 26 mmol/L (ref 22–32)
CREATININE: 0.62 mg/dL (ref 0.61–1.24)
CREATININE: 0.67 mg/dL (ref 0.61–1.24)
Calcium: 8.1 mg/dL — ABNORMAL LOW (ref 8.9–10.3)
GFR calc non Af Amer: >60 mL/min (ref >60)
Glucose, Bld: 236 mg/dL — ABNORMAL HIGH (ref 65–99)
Glucose, Bld: 251 mg/dL — ABNORMAL HIGH (ref 65–99)
POTASSIUM: 2.6 mmol/L — AB (ref 3.5–5.1)
Potassium: 3 mmol/L — ABNORMAL LOW (ref 3.5–5.1)
SODIUM: 141 mmol/L (ref 135–145)
SODIUM: 144 mmol/L (ref 135–145)

## 2014-12-05 LAB — MAGNESIUM: MAGNESIUM: 1.7 mg/dL (ref 1.7–2.4)

## 2014-12-05 LAB — PHOSPHORUS: Phosphorus: 3.3 mg/dL (ref 2.5–4.6)

## 2014-12-05 LAB — HEMOGLOBIN A1C
Hgb A1c MFr Bld: 13.8 % — ABNORMAL HIGH (ref 4.8–5.6)
Mean Plasma Glucose: 349 mg/dL

## 2014-12-05 MED ORDER — LORAZEPAM 2 MG/ML IJ SOLN
1.0000 mg | INTRAMUSCULAR | Status: DC | PRN
  Administered 2014-12-05 (×3): 2 mg via INTRAVENOUS
  Administered 2014-12-06: 4 mg via INTRAVENOUS
  Administered 2014-12-06 – 2014-12-09 (×7): 2 mg via INTRAVENOUS
  Administered 2014-12-09: 3 mg via INTRAVENOUS
  Filled 2014-12-05 (×2): qty 1
  Filled 2014-12-05: qty 2
  Filled 2014-12-05 (×4): qty 1
  Filled 2014-12-05: qty 2
  Filled 2014-12-05 (×2): qty 1
  Filled 2014-12-05: qty 2
  Filled 2014-12-05 (×2): qty 1
  Filled 2014-12-05: qty 2

## 2014-12-05 MED ORDER — POTASSIUM CHLORIDE 10 MEQ/50ML IV SOLN
10.0000 meq | INTRAVENOUS | Status: AC
  Administered 2014-12-05 (×6): 10 meq via INTRAVENOUS
  Filled 2014-12-05 (×6): qty 50

## 2014-12-05 MED ORDER — POTASSIUM CHLORIDE 10 MEQ/100ML IV SOLN: 10.0000 meq | INTRAVENOUS | Status: DC

## 2014-12-05 MED ORDER — INSULIN ASPART 100 UNIT/ML ~~LOC~~ SOLN
0.0000 [IU] | SUBCUTANEOUS | Status: DC
  Administered 2014-12-05 (×3): 3 [IU] via SUBCUTANEOUS

## 2014-12-05 MED ORDER — KCL IN DEXTROSE-NACL 20-5-0.45 MEQ/L-%-% IV SOLN
INTRAVENOUS | Status: DC
  Administered 2014-12-05: 75 mL/h via INTRAVENOUS
  Administered 2014-12-05: via INTRAVENOUS
  Administered 2014-12-06: 50 mL/h via INTRAVENOUS
  Administered 2014-12-07 – 2014-12-08 (×3): via INTRAVENOUS
  Filled 2014-12-05 (×7): qty 1000

## 2014-12-05 MED ORDER — INSULIN DETEMIR 100 UNIT/ML ~~LOC~~ SOLN
10.0000 [IU] | Freq: Every day | SUBCUTANEOUS | Status: DC
  Filled 2014-12-05: qty 0.1

## 2014-12-05 MED ORDER — LIP MEDEX EX OINT
TOPICAL_OINTMENT | CUTANEOUS | Status: AC
  Administered 2014-12-05: 01:00:00
  Filled 2014-12-05: qty 7

## 2014-12-05 MED ORDER — FOLIC ACID 5 MG/ML IJ SOLN
1.0000 mg | Freq: Every day | INTRAMUSCULAR | Status: DC
  Administered 2014-12-05 – 2014-12-08 (×4): 1 mg via INTRAVENOUS
  Filled 2014-12-05 (×5): qty 0.2

## 2014-12-05 NOTE — Progress Notes (Signed)
[facility] Progress Note Patient Name: Michael [name] DOB: Dec 23, 1955 MRN: [id number]   Date of Service  12/05/2014  HPI/Events of Note  K+ = 2.6 and Creatinine = 0.62.  eICU Interventions  Will replete K+ and repeat BMP at 2 PM.     Intervention Category Intermediate Interventions: Electrolyte abnormality - evaluation and management  Sommer,Steven Eugene 12/05/2014, 6:47 AM

## 2014-12-05 NOTE — Progress Notes (Addendum)
Patient ID: IMRAAN WENDELL, male   DOB: [birth year], 59 y.o.   MRN: [id number] [facility] PROGRESS NOTE  ALARIC GLADWIN YPP:[id number] DOB: Jun 06, 1955 DOA: 12/03/2014 PCP: Juluis Mire, MD  Brief narrative:    59 y.o. male, with past medical history significant for uncontrolled insulin dependent diabetes mellitus , bipolar disorder, chronic pancreatitis, previous history of drug abuse (THC, opiates), alcohol abuse. Patient was found unresponsive by EMS. Apparently he lives with his sister and a niece. They report that his last alcoholic beverage was about a month ago.   On admission, the thought was that patient possibly has viral or bacterial meningitis considering fever, tachycardia, tachypnea, leukocytosis. Lumbar puncture performed, CSF analysis not remarkable for acute infection. Chest x-ray and urinalysis were unremarkable. CT head showed no acute intracranial findings. Alcohol level is within normal limits. UDS was unremarkable.   Blood work was notable for white blood cell count of 11.4, lactic acid of 3.31, potassium of 2, sodium 147, normal creatinine. Additionally, CBG was more than 600, glucose on BMP was more than 700. Ketones were not present in urine. He was started on insulin drip for hyperosmolar hyperglycemia. He was very restless since the admission and was started on CIWA protocol. Critical care has seen him in consultation.  Overnight, patient agitated, combative. Precedex drip started. Because patient is on Precedex drip he will need to stay in step down unit.  Assessment/Plan:    Principal problem: Acute encephalopathy / history of alcohol abuse / possible acute alcohol withdrawal with delirium - Patient has history of alcohol abuse although per his family report last alcoholic beverage about a month ago. - Urine drug screen and alcohol level were within normal limits on the admission - Patient was very combative and restless overnight. He required Ativan and  Haldol per CIWA protocol but continued to be agitated, pulling on lines. We started Precedex drip. Continue to monitor in step down unit - Of note, the thought  on admission was that patient may also have acute bacterial or viral meningitis. Lumbar puncture was performed at the time of the admission. CSF is unremarkable. The antibiotics were started on the admission, acyclovir, vancomycin, Rocephin. We stopped acyclovir and vancomycin 12/04/2014. We continued Rocephin  to treat for possible pneumonia.  - EEG done 11/11 but no epileptiform activity.Will stop keppra. - Continue to monitor in step down unit  Active Problems: Uncontrolled diabetes mellitus with hyperosmolarity without coma, with long-term current use of insulin ([place]) - Patient had CBG of more than 600 on the admission, glucose on basic metabolic panel was more than 700. Ketones were not present in urine. He was started on insulin drip.  - Recent A1c in 10/2014 was 11.8 indicating poor glycemic control. - Anion gap was within normal limits 11/11. Transitioned to subQ insulin. - CBG's in past 24 hours: 238, 146, 81 - Continue sliding scale insulin  - Resume Levemir 10 units daily a sterile 15 units since he is not eating   Sepsis due to unspecified organism / leukocytosis / lactic acidosis - Sepsis criteria met on the admission with fever of 101.24F, tachycardia, tachypnea, hypotension, lactic acidosis - As mentioned, initial thought was that patient has acute bacterial or viral meningitis. A lumbar puncture was done on the admission but CSF is unremarkable. - Acyclovir and vancomycin were stopped 12/04/2014. He is still on Rocephin for possible pneumonia. - Follow-up CBC tomorrow morning.   Hypernatremia - Likely secondary to dehydration - Sodium normalized with IV fluids  Hypokalemia -  Likely due to altered mental status and poor nutritional status - Potassium is 2.6 this morning. Aggressively supplemented.   Severe protein  calorie malnutrition - Seen by dietitian in consultation   DVT Prophylaxis  - Heparin subQ ordered   Code Status: Full.  Family Communication:  family not at the bedside this morning  Disposition Plan: remains in SDU because he requires Precedex drip   IV access:  Peripheral IV  Procedures and diagnostic studies:    Dg Chest 1 View 12/03/2014  Stable age advanced cerebral atrophy, ventriculomegaly and periventricular white matter disease. No acute intracranial findings or mass lesions. Electronically Signed   By: Marijo Sanes M.D.   On: 12/03/2014 19:37   Ct Head Wo Contrast 12/03/2014   Stable age advanced cerebral atrophy, ventriculomegaly and periventricular white matter disease. No acute intracranial findings or mass lesions. Electronically Signed   By: Marijo Sanes M.D.   On: 12/03/2014 19:37   Dg Chest Port 1 View 12/04/2014   Newly coarsened interstitial markings is likely from lower volumes, but cannot exclude developing volume overload/edema. Electronically Signed   By: Monte Fantasia M.D.   On: 12/04/2014 05:53    Medical Consultants:  None   Other Consultants:  None   IAnti-Infectives:   Acyclovir, rocephin, vancomycin, ampicillin. Started on admission and stopped 11/1 except for rocephin. Rocephin 12/03/2014 -->     Leisa Lenz, MD  [facility] Pager [phone]  Time spent in minutes: 25 minutes  If 7PM-7AM, please contact night-coverage [email or web address] Password [email or web address] 12/05/2014, 8:03 AM   LOS: 2 days    HPI/Subjective: Patient agitated, combative overnight.   Objective: Filed Vitals:   12/05/14 0200 12/05/14 0300 12/05/14 0400 12/05/14 0616  BP: 108/68  127/78 119/79  Pulse: 73 72 73 73  Temp: 98.8 F (37.1 C) 98.8 F (37.1 C) 98.8 F (37.1 C) 98.8 F (37.1 C)  TempSrc:   Core (Comment) Core (Comment)  Resp: _0 Height:      Weight:      SpO2: 97% 98% 97% 100%    Intake/Output Summary (Last 24 hours) at 12/05/14 0803 Last  data filed at 12/05/14 0615  Gross per 24 hour  Intake 1511.67 ml  Output    817 ml  Net 694.67 ml    Exam:   General:  Pt is  sleeping, no distress   Cardiovascular:  rate controlled, appreciate S1-S2  Respiratory: bilateral air entry, no wheezing   Abdomen: appreciate bowel sounds, nontender abdomen  Extremities: No leg swelling, pulses palpable   Neuro: no focal deficits   Data Reviewed: Basic Metabolic Panel:  Recent Labs Lab 12/03/14 2136 12/03/14 2350 12/04/14 0500 12/04/14 0945 12/05/14 0530  NA 140 147* 145 145 141  K 2.6* 2.0* 3.3* 3.0* 2.6*  CL 99* 112* 111 111 108  CO2 _1 GLUCOSE 786* 334* 79 274* 236*  BUN <5* <5* <5* <5* <5*  CREATININE 0.87 0.69 0.53* 0.55* 0.62  CALCIUM 9.7 8.8* 8.3* 8.2* 8.1*  MG  --  1.9  --   --  1.7  PHOS  --  1.1*  --   --  3.3   Liver Function Tests:  Recent Labs Lab 12/03/14 1858 12/04/14 0500  AST 33 17  ALT 36 23  ALKPHOS 205* 117  BILITOT 0.5 0.8  PROT 7.3 5.3*  ALBUMIN 4.1 2.8*    Recent Labs Lab 12/03/14 1858  LIPASE 22   No results for input(s):  AMMONIA in the last 168 hours. CBC:  Recent Labs Lab 12/03/14 1858 12/03/14 1923 12/04/14 0500  WBC 11.4*  --  15.3*  NEUTROABS 10.3*  --   --   HGB 17.6* 19.4* 13.4  HCT 49.0 57.0* 36.2*  MCV 92.3  --  89.8  PLT 211  --  184   Cardiac Enzymes:  Recent Labs Lab 12/03/14 1858  TROPONINI <0.03   BNP: Invalid input(s): POCBNP CBG:  Recent Labs Lab 12/04/14 0636 12/04/14 0807 12/04/14 1204 12/04/14 1554 12/04/14 2145  GLUCAP 133* 202* 238* 146* 81    CSF culture     Status: None (Preliminary result)   Collection Time: 12/03/14  9:39 PM  Result Value Ref Range Status   Specimen Description CSF  Final   Special Requests NONE  Final   Gram Stain   Final    CYTOSPIN WBC PRESENT, PREDOMINANTLY PMN NO ORGANISMS SEEN    Culture PENDING  Incomplete   Report Status PENDING  Incomplete  MRSA PCR Screening     Status:  Abnormal   Collection Time: 12/03/14 10:46 PM  Result Value Ref Range Status   MRSA by PCR POSITIVE (A) NEGATIVE Final     Scheduled Meds: .  cefTRIAXone   1 g Intravenous Q24H  . feeding supplement   237 mL Oral BID BM  . folic acid  1 mg Oral Daily  . heparin subcutaneous  5,000 Units Subcutaneous 3 times per day  . insulin aspart  0-15 Units Subcutaneous TID WC  . insulin aspart  0-5 Units Subcutaneous QHS  . levETIRAcetam  1,000 mg Intravenous BID  . LORazepam  0-4 mg Intravenous Q6H  . multivitamin   1 tablet Oral Daily  . mupirocin ointment  1 application Nasal BID  . thiamine  100 mg Oral Daily  . ziprasidone  20 mg Intramuscular Once   Continuous Infusions: . dexmedetomidine 0.5 mcg/kg/hr (12/05/14 0636)  . insulin (NOVOLIN-R) infusion Stopped (12/04/14 0900)

## 2014-12-05 NOTE — ED Provider Notes (Signed)
CSN: [id number]     Arrival date & time 12/03/14  1816 History   First MD Initiated Contact with Patient 12/03/14 1822     Chief Complaint  Patient presents with  . Altered Mental Status  . Hyperglycemia     (Consider location/radiation/quality/duration/timing/severity/associated sxs/prior Treatment) HPI Comments: LEVEL 5 CAVEAT FOR ALTERED MENTAL STATUS  Pt comes in with cc of AMS. Pt was last seen normal in the morning, prior to family going to work. When they arrived from work pt was unresponsive and had 3 empty nottles of soda and jjug of kool-aid empty.    The history is limited by the condition of the patient.    Past Medical History  Diagnosis Date  . Chronic pancreatitis   . Gastritis due to alcohol without hemorrhage   . HTN (hypertension)   . Pancreatic lesion     appears innocent and related to chronic pancreatitis  . Heavy alcohol use     hx of  . Depression   . Helicobacter pylori gastritis 03/27/2011    on endoscopy, treated with omeprazole, clarithromycin and amoxicillin  . Insomnia   . ACE inhibitor-aggravated angioedema   . Sessile colonic polyp   . GERD (gastroesophageal reflux disease)   . Chronic abdominal pain on narcotics 06/27/2012  . Hyperlipidemia   . Type II diabetes mellitus ([place])   . Migraine     "couple times/year" (11/17/2014)  . Seizures ([place])     "last one was ~ 3 wks ago" (11/17/2014)  . Chronic back pain   . Bipolar disorder ([place])   . Hepatitis C antibody test positive    Past Surgical History  Procedure Laterality Date  . Esophagogastroduodenoscopy  03/22/2011    Procedure: ESOPHAGOGASTRODUODENOSCOPY (EGD);  Surgeon: Gatha Mayer, MD;  Location: [facility];  Service: Endoscopy;  Laterality: N/A;  egd with balloon   . Balloon dilation  03/22/2011    Procedure: BALLOON DILATION;  Surgeon: Gatha Mayer, MD;  Location: [facility];  Service: Endoscopy;  Laterality: N/A;  . Eus  04/20/2011    Procedure: UPPER ENDOSCOPIC ULTRASOUND  (EUS) LINEAR;  Surgeon: Milus Banister, MD;  Location: [facility];  Service: Endoscopy;  Laterality: N/A;  . Colonoscopy w/ biopsies and polypectomy  09/12/11  . Colonoscopy N/A 05/22/2012    Procedure: COLONOSCOPY;  Surgeon: Gatha Mayer, MD;  Location: [facility];  Service: Endoscopy;  Laterality: N/A;   Family History  Problem Relation Age of Onset  . Colon cancer Neg Hx   . Stomach cancer Neg Hx   . Anesthesia problems Neg Hx   . Hypotension Neg Hx   . Pseudochol deficiency Neg Hx   . Malignant hyperthermia Neg Hx   . Hypertension Sister   . Diabetes Sister   . Heart disease Sister    Social History  Substance Use Topics  . Smoking status: Current Every Day Smoker -- 0.60 packs/day for 40 years    Types: Cigarettes  . Smokeless tobacco: Never Used  . Alcohol Use: 6.0 oz/week    10 Standard drinks or equivalent per week     Comment: 11/17/2014 "was drinking 2-5 drinks; stopped ~ 4-5 months"    Review of Systems  Unable to perform ROS: Patient unresponsive      Allergies  Ace inhibitors and Losartan  Home Medications   Prior to Admission medications   Medication Sig Start Date End Date Taking? Authorizing Provider  acetaminophen (TYLENOL) 500 MG tablet Take 1 tablet (500 mg total) by  mouth every 6 (six) hours as needed. 11/25/14 11/25/15 Yes Norval Gable, MD  aspirin EC 81 MG tablet Take 81 mg by mouth at bedtime.   Yes Historical Provider, MD  FLUoxetine (PROZAC) 20 MG capsule Take 20 mg by mouth 2 (two) times daily.    Yes Historical Provider, MD  folic acid (FOLVITE) 1 MG tablet TK 1 T PO D 10/15/14  Yes Historical Provider, MD  gabapentin (NEURONTIN) 300 MG capsule Take 1 capsule (300 mg total) by mouth 3 (three) times daily. 10/23/14  Yes Juluis Mire, MD  Insulin Detemir (LEVEMIR FLEXTOUCH) 100 UNIT/ML Pen Inject 15 units daily before bedtime 11/26/14  Yes Marjan Rabbani, MD  lipase/protease/amylase (CREON) 36000 UNITS CPEP capsule Take 1 capsule (36,000  Units total) by mouth 3 (three) times daily. 11/19/14  Yes Iline Oven, MD  lovastatin (MEVACOR) 40 MG tablet Take 1 tablet (40 mg total) by mouth at bedtime. 03/20/14  Yes Jerrye Noble, MD  magnesium oxide (MAG-OX) 400 (241.3 MG) MG tablet Take 1 tablet (400 mg total) by mouth daily. 10/15/14  Yes Shela Leff, MD  metFORMIN (GLUCOPHAGE) 500 MG tablet TK 2 TS PO BID WITH A MEAL 10/15/14  Yes Historical Provider, MD  Multiple Vitamin (MULTIVITAMIN WITH MINERALS) TABS tablet Take 1 tablet by mouth daily. 11/19/14  Yes Iline Oven, MD  Vitamin D, Ergocalciferol, (DRISDOL) 50000 UNITS CAPS capsule Take 1 capsule (50,000 Units total) by mouth every 7 (seven) days. 11/30/14  Yes Juluis Mire, MD  ACCU-CHEK SOFTCLIX LANCETS lancets Check blood sugar up to 3 times a day 11/26/14   Juluis Mire, MD  Blood Glucose Monitoring Suppl (ACCU-CHEK AVIVA PLUS) W/DEVICE KIT Check blood sugar up to 3 times a day 11/26/14   Juluis Mire, MD  feeding supplement, GLUCERNA SHAKE, (GLUCERNA SHAKE) LIQD Take 237 mLs by mouth 3 (three) times daily with meals. Patient not taking: Reported on 12/03/2014 11/19/14   Iline Oven, MD  glucose blood (ACCU-CHEK AVIVA PLUS) test strip Check blood sugar up to 3 times a day 11/26/14   Juluis Mire, MD  ibuprofen (ADVIL,MOTRIN) 800 MG tablet Take 1 tablet (800 mg total) by mouth every 8 (eight) hours as needed. 10/30/14   Juluis Mire, MD  Insulin Pen Needle 31G X 5 MM MISC Use to inject 15 units of Levemir one time a day 11/26/14   Juluis Mire, MD  Insulin Syringe-Needle U-100 (B-D INSULIN SYRINGE) 31G X 5/16" 0.3 ML MISC Use to inject insulin into the skin at bedtime each day. diag code E11.40. Insulin dependent 11/25/14   Norval Gable, MD  ondansetron (ZOFRAN) 4 MG tablet Take 1 tablet (4 mg total) by mouth every 6 (six) hours. Patient taking differently: Take 4 mg by mouth every 8 (eight) hours as needed for nausea.  05/14/14   Jerrye Noble, MD   pantoprazole (PROTONIX) 40 MG tablet Take 1 tablet (40 mg total) by mouth daily. Patient not taking: Reported on 12/03/2014 02/07/13   Jerrye Noble, MD   BP 98/73 mmHg  Pulse 63  Temp(Src) 96.8 F (36 C) (Core (Comment))  Resp 27  Ht _0  (1.626 m)  Wt 125 lb 3.5 oz (56.8 kg)  BMI 21.48 kg/m2  SpO2 99% Physical Exam  Constitutional: He appears well-developed.  HENT:  Head: Atraumatic.  Eyes: Conjunctivae are normal.  Neck: Neck supple.  Cardiovascular:  tachycardic  Pulmonary/Chest: Effort normal.  Abdominal: Soft.  Neurological:  Moving all 4 extremities  Psychiatric:  agitated  Nursing note and vitals reviewed.   ED Course  .Lumbar Puncture Date/Time: 12/03/2014 10:00 PM Performed by: Varney Biles Authorized by: Varney Biles Consent: The procedure was performed in an emergent situation. Patient identity confirmed: arm band Time out: Immediately prior to procedure a "time out" was called to verify the correct patient, procedure, equipment, support staff and site/side marked as required. Indications: evaluation for infection and evaluation for altered mental status Anesthesia: local infiltration Anesthetic total: 5 ml Patient sedated: yes Sedatives: lorazepam Preparation: Patient was prepped and draped in the usual sterile fashion. Lumbar space: L4-L5 interspace Patient's position: left lateral decubitus Needle gauge: 18 Fluid appearance: clear Tubes of fluid: 4 Total volume: 6 ml Post-procedure: site cleaned Patient tolerance: Patient tolerated the procedure well with no immediate complications     CENTRAL LINE Performed by: Varney Biles Consent: The procedure was performed in an emergent situation. Required items: required blood products, implants, devices, and special equipment available Patient identity confirmed: arm band and provided demographic data Time out: Immediately prior to procedure a "time out" was called to verify the correct  patient, procedure, equipment, support staff and site/side marked as required. Indications: vascular access Anesthesia: local infiltration Local anesthetic: lidocaine 1% with epinephrine Anesthetic total: 3 ml Patient sedated: no Preparation: skin prepped with 2% chlorhexidine Skin prep agent dried: skin prep agent completely dried prior to procedure Sterile barriers: all five maximum sterile barriers used - cap, mask, sterile gown, sterile gloves, and large sterile sheet Hand hygiene: hand hygiene performed prior to central venous catheter insertion  Location details: femoral L  Catheter type: triple lumen Catheter size: 8 Fr Pre-procedure: landmarks identified Ultrasound guidance: NO Successful placement: yes Post-procedure: line sutured and dressing applied Assessment: blood return through all parts, free fluid flow, placement verified by x-ray and no pneumothorax on x-ray Patient tolerance: Patient tolerated the procedure well with no immediate complications.    (including critical care time) Labs Review Labs Reviewed  MRSA PCR SCREENING - Abnormal; Notable for the following:    MRSA by PCR POSITIVE (*)    All other components within normal limits  COMPREHENSIVE METABOLIC PANEL - Abnormal; Notable for the following:    Sodium 129 (*)    Potassium 2.8 (*)    Chloride 89 (*)    CO2 21 (*)    Glucose, Bld 1221 (*)    BUN <5 (*)    Alkaline Phosphatase 205 (*)    Anion gap 19 (*)    All other components within normal limits  CBC WITH DIFFERENTIAL/PLATELET - Abnormal; Notable for the following:    WBC 11.4 (*)    Hemoglobin 17.6 (*)    Neutro Abs 10.3 (*)    Lymphs Abs 0.5 (*)    All other components within normal limits  URINALYSIS, ROUTINE W REFLEX MICROSCOPIC (NOT AT [facility]) - Abnormal; Notable for the following:    Specific Gravity, Urine 1.034 (*)    Glucose, UA >1000 (*)    All other components within normal limits  BLOOD GAS, VENOUS - Abnormal; Notable for the  following:    pH, Ven 7.360 (*)    pCO2, Ven 43.5 (*)    pO2, Ven 59.4 (*)    All other components within normal limits  BASIC METABOLIC PANEL - Abnormal; Notable for the following:    Potassium 2.6 (*)    Chloride 99 (*)    Glucose, Bld 786 (*)    BUN <5 (*)    All other components within  normal limits  GLUCOSE, CSF - Abnormal; Notable for the following:    Glucose, CSF 509 (*)    All other components within normal limits  PROTEIN, CSF - Abnormal; Notable for the following:    Total  Protein, CSF 63 (*)    All other components within normal limits  CSF CELL COUNT WITH DIFFERENTIAL - Abnormal; Notable for the following:    RBC Count, CSF 14 (*)    All other components within normal limits  BASIC METABOLIC PANEL - Abnormal; Notable for the following:    Sodium 147 (*)    Potassium 2.0 (*)    Chloride 112 (*)    Glucose, Bld 334 (*)    BUN <5 (*)    Calcium 8.8 (*)    All other components within normal limits  BASIC METABOLIC PANEL - Abnormal; Notable for the following:    Potassium 3.0 (*)    Glucose, Bld 274 (*)    BUN <5 (*)    Creatinine, Ser 0.55 (*)    Calcium 8.2 (*)    All other components within normal limits  COMPREHENSIVE METABOLIC PANEL - Abnormal; Notable for the following:    Potassium 3.3 (*)    BUN <5 (*)    Creatinine, Ser 0.53 (*)    Calcium 8.3 (*)    Total Protein 5.3 (*)    Albumin 2.8 (*)    All other components within normal limits  CBC - Abnormal; Notable for the following:    WBC 15.3 (*)    RBC 4.03 (*)    HCT 36.2 (*)    MCHC 37.0 (*)    All other components within normal limits  PHOSPHORUS - Abnormal; Notable for the following:    Phosphorus 1.1 (*)    All other components within normal limits  GLUCOSE, CAPILLARY - Abnormal; Notable for the following:    Glucose-Capillary >600 (*)    All other components within normal limits  GLUCOSE, CAPILLARY - Abnormal; Notable for the following:    Glucose-Capillary 514 (*)    All other components  within normal limits  GLUCOSE, CAPILLARY - Abnormal; Notable for the following:    Glucose-Capillary 336 (*)    All other components within normal limits  GLUCOSE, CAPILLARY - Abnormal; Notable for the following:    Glucose-Capillary 242 (*)    All other components within normal limits  LACTIC ACID, PLASMA - Abnormal; Notable for the following:    Lactic Acid, Venous 2.5 (*)    All other components within normal limits  GLUCOSE, CAPILLARY - Abnormal; Notable for the following:    Glucose-Capillary 181 (*)    All other components within normal limits  GLUCOSE, CAPILLARY - Abnormal; Notable for the following:    Glucose-Capillary 122 (*)    All other components within normal limits  GLUCOSE, CAPILLARY - Abnormal; Notable for the following:    Glucose-Capillary 112 (*)    All other components within normal limits  GLUCOSE, CAPILLARY - Abnormal; Notable for the following:    Glucose-Capillary 108 (*)    All other components within normal limits  GLUCOSE, CAPILLARY - Abnormal; Notable for the following:    Glucose-Capillary 133 (*)    All other components within normal limits  HEMOGLOBIN A1C - Abnormal; Notable for the following:    Hgb A1c MFr Bld 13.8 (*)    All other components within normal limits  GLUCOSE, CAPILLARY - Abnormal; Notable for the following:    Glucose-Capillary 202 (*)  All other components within normal limits  GLUCOSE, CAPILLARY - Abnormal; Notable for the following:    Glucose-Capillary 146 (*)    All other components within normal limits  BASIC METABOLIC PANEL - Abnormal; Notable for the following:    Potassium 2.6 (*)    Glucose, Bld 236 (*)    BUN <5 (*)    Calcium 8.1 (*)    All other components within normal limits  GLUCOSE, CAPILLARY - Abnormal; Notable for the following:    Glucose-Capillary 238 (*)    All other components within normal limits  BASIC METABOLIC PANEL - Abnormal; Notable for the following:    Potassium 3.0 (*)    Chloride 112 (*)     Glucose, Bld 251 (*)    BUN <5 (*)    Calcium 8.3 (*)    All other components within normal limits  GLUCOSE, CAPILLARY - Abnormal; Notable for the following:    Glucose-Capillary 235 (*)    All other components within normal limits  GLUCOSE, CAPILLARY - Abnormal; Notable for the following:    Glucose-Capillary 158 (*)    All other components within normal limits  CBG MONITORING, ED - Abnormal; Notable for the following:    Glucose-Capillary >600 (*)    All other components within normal limits  I-STAT CG4 LACTIC ACID, ED - Abnormal; Notable for the following:    Lactic Acid, Venous 9.72 (*)    All other components within normal limits  I-STAT CHEM 8, ED - Abnormal; Notable for the following:    Sodium 129 (*)    Chloride 89 (*)    BUN <3 (*)    Glucose, Bld >700 (*)    Calcium, Ion 1.11 (*)    Hemoglobin 19.4 (*)    HCT 57.0 (*)    All other components within normal limits  I-STAT CG4 LACTIC ACID, ED - Abnormal; Notable for the following:    Lactic Acid, Venous 3.31 (*)    All other components within normal limits  CBG MONITORING, ED - Abnormal; Notable for the following:    Glucose-Capillary >600 (*)    All other components within normal limits  CBG MONITORING, ED - Abnormal; Notable for the following:    Glucose-Capillary 566 (*)    All other components within normal limits  CULTURE, BLOOD (ROUTINE X 2)  CULTURE, BLOOD (ROUTINE X 2)  URINE CULTURE  CSF CULTURE  TROPONIN I  PROTIME-INR  LIPASE, BLOOD  URINE MICROSCOPIC-ADD ON  URINE RAPID DRUG SCREEN, HOSP PERFORMED  CSF CELL COUNT WITH DIFFERENTIAL  MAGNESIUM  LACTIC ACID, PLASMA  BRAIN NATRIURETIC PEPTIDE  ETHANOL  MAGNESIUM  PHOSPHORUS  GLUCOSE, CAPILLARY    Imaging Review Dg Chest 1 View  12/03/2014  CLINICAL DATA:  Altered mental status tonight. EXAM: CT HEAD WITHOUT CONTRAST TECHNIQUE: Contiguous axial images were obtained from the base of the skull through the vertex without intravenous contrast.  COMPARISON:  10/09/2014 FINDINGS: Stable age advanced cerebral atrophy, ventriculomegaly and periventricular white matter disease. No extra-axial fluid collections are identified. No CT findings for acute hemispheric infarction or intracranial hemorrhage. No mass lesions. The brainstem and cerebellum are normal. No acute bony findings. Stable left maxillary sinus wall thickening. The globes are intact. IMPRESSION: Stable age advanced cerebral atrophy, ventriculomegaly and periventricular white matter disease. No acute intracranial findings or mass lesions. Electronically Signed   By: Marijo Sanes M.D.   On: 12/03/2014 19:37   Ct Head Wo Contrast  12/03/2014  CLINICAL DATA:  Altered mental  status tonight. EXAM: CT HEAD WITHOUT CONTRAST TECHNIQUE: Contiguous axial images were obtained from the base of the skull through the vertex without intravenous contrast. COMPARISON:  10/09/2014 FINDINGS: Stable age advanced cerebral atrophy, ventriculomegaly and periventricular white matter disease. No extra-axial fluid collections are identified. No CT findings for acute hemispheric infarction or intracranial hemorrhage. No mass lesions. The brainstem and cerebellum are normal. No acute bony findings. Stable left maxillary sinus wall thickening. The globes are intact. IMPRESSION: Stable age advanced cerebral atrophy, ventriculomegaly and periventricular white matter disease. No acute intracranial findings or mass lesions. Electronically Signed   By: Marijo Sanes M.D.   On: 12/03/2014 19:37   Dg Chest Port 1 View  12/04/2014  CLINICAL DATA:  Altered mental status. EXAM: PORTABLE CHEST 1 VIEW COMPARISON:  Yesterday FINDINGS: EKG leads and wires create artifact. More coarsened appearance of the perihilar interstitial marking which is likely from lower lung volumes. No overt interstitial edema, effusion, or pneumothorax. Normal heart size and mediastinal contours. IMPRESSION: Newly coarsened interstitial markings is likely  from lower volumes, but cannot exclude developing volume overload/edema. Electronically Signed   By: Monte Fantasia M.D.   On: 12/04/2014 05:53   I have personally reviewed and evaluated these images and lab results as part of my medical decision-making.   EKG Interpretation   Date/Time:  Thursday December 03 2014 18:34:24 EST Ventricular Rate:  122 PR Interval:  143 QRS Duration: 80 QT Interval:  268 QTC Calculation: 382 R Axis:   70 Text Interpretation:  Sinus tachycardia Ventricular premature complex  Aberrant complex Repol abnrm suggests ischemia, diffuse leads Nonspecific  ST and T wave abnormality No significant change since last tracing -  besides tachycardia and non specific ST and T wave changes Confirmed by  Kathrynn Humble, MD, [facility] [phone]) on 12/03/2014 8:05:36 PM      MDM   Final diagnoses:  Diabetic ketoacidosis with coma associated with type 2 diabetes mellitus ([place])  Septic shock ([facility])  Lactic acidosis   Pt comes in with cc AMS.  - Elevated blood sugar. Has DKA, small gap. Hydration and insulin started. - Lactate is 9.7 - combination of DKA and infection. Broad spectrum antibiotics started.  Despite fluid resuscitation, mentation didn't improve. LP done.  Step down admission.  CRITICAL CARE Performed by: Varney Biles   Total critical care time: 40 minutes  Critical care time was exclusive of separately billable procedures and treating other patients.  Critical care was necessary to treat or prevent imminent or life-threatening deterioration.  Critical care was time spent personally by me on the following activities: development of treatment plan with patient and/or surrogate as well as nursing, discussions with consultants, evaluation of patient's response to treatment, examination of patient, obtaining history from patient or surrogate, ordering and performing treatments and interventions, ordering and review of laboratory studies, ordering and review of  radiographic studies, pulse oximetry and re-evaluation of patient's condition.     Varney Biles, MD 12/05/14 1742

## 2014-12-05 NOTE — Progress Notes (Signed)
PCCM PROGRESS NOTE  ADMISSION DATE: 12/03/2014 CONSULT DATE: 12/04/2014 REFERRING PROVIDER: Dr. Charlies Silvers  CC: Altered mental status  SUBJECTIVE: Started on precedex last night.  OBJECTIVE: BP 140/81 mmHg  Pulse 72  Temp(Src) 99 F (37.2 C) (Core (Comment))  Resp 21  Ht 5\' 4"  (1.626 m)  Wt 125 lb 3.5 oz (56.8 kg)  BMI 21.48 kg/m2  SpO2 98%  I/O last 3 completed shifts: In: 5030.9 [I.V.:1866.9; Other:1000; IV Piggyback:2164] Out: 3717 [id number]  General: sedated HEENT: pupils reactive Cardiac: regular, no murmur Chest: snoring, no wheeze Abd: soft, non tender Ext: no edema Neuro: RASS -2 Skin: no rashes   CMP Latest Ref Rng 12/05/2014 12/04/2014 12/04/2014  Glucose 65 - 99 mg/dL 236(H) 274(H) 79  BUN 6 - 20 mg/dL <5(L) <5(L) <5(L)  Creatinine 0.61 - 1.24 mg/dL 0.62 0.55(L) 0.53(L)  Sodium 135 - 145 mmol/L 141 145 145  Potassium 3.5 - 5.1 mmol/L 2.6(LL) 3.0(L) 3.3(L)  Chloride 101 - 111 mmol/L 108 111 111  CO2 22 - 32 mmol/L 27 27 29   Calcium 8.9 - 10.3 mg/dL 8.1(L) 8.2(L) 8.3(L)  Total Protein 6.5 - 8.1 g/dL - - 5.3(L)  Total Bilirubin 0.3 - 1.2 mg/dL - - 0.8  Alkaline Phos 38 - 126 U/L - - 117  AST 15 - 41 U/L - - 17  ALT 17 - 63 U/L - - 23     CBC Latest Ref Rng 12/04/2014 12/03/2014 12/03/2014  WBC 4.0 - 10.5 K/uL 15.3(H) - 11.4(H)  Hemoglobin 13.0 - 17.0 g/dL 13.4 19.4(H) 17.6(H)  Hematocrit 39.0 - 52.0 % 36.2(L) 57.0(H) 49.0  Platelets 150 - 400 K/uL 184 - 211    CBG (last 3)   Recent Labs  12/04/14 1554 12/04/14 2145 12/05/14 0825  GLUCAP 146* 81 235*     Dg Chest 1 View  12/03/2014  CLINICAL DATA:  Altered mental status tonight. EXAM: CT HEAD WITHOUT CONTRAST TECHNIQUE: Contiguous axial images were obtained from the base of the skull through the vertex without intravenous contrast. COMPARISON:  10/09/2014 FINDINGS: Stable age advanced cerebral atrophy, ventriculomegaly and periventricular white matter disease. No extra-axial fluid  collections are identified. No CT findings for acute hemispheric infarction or intracranial hemorrhage. No mass lesions. The brainstem and cerebellum are normal. No acute bony findings. Stable left maxillary sinus wall thickening. The globes are intact. IMPRESSION: Stable age advanced cerebral atrophy, ventriculomegaly and periventricular white matter disease. No acute intracranial findings or mass lesions. Electronically Signed   By: Marijo Sanes M.D.   On: 12/03/2014 19:37   Ct Head Wo Contrast  12/03/2014  CLINICAL DATA:  Altered mental status tonight. EXAM: CT HEAD WITHOUT CONTRAST TECHNIQUE: Contiguous axial images were obtained from the base of the skull through the vertex without intravenous contrast. COMPARISON:  10/09/2014 FINDINGS: Stable age advanced cerebral atrophy, ventriculomegaly and periventricular white matter disease. No extra-axial fluid collections are identified. No CT findings for acute hemispheric infarction or intracranial hemorrhage. No mass lesions. The brainstem and cerebellum are normal. No acute bony findings. Stable left maxillary sinus wall thickening. The globes are intact. IMPRESSION: Stable age advanced cerebral atrophy, ventriculomegaly and periventricular white matter disease. No acute intracranial findings or mass lesions. Electronically Signed   By: Marijo Sanes M.D.   On: 12/03/2014 19:37   Dg Chest Port 1 View  12/04/2014  CLINICAL DATA:  Altered mental status. EXAM: PORTABLE CHEST 1 VIEW COMPARISON:  Yesterday FINDINGS: EKG leads and wires create artifact. More coarsened appearance of the perihilar interstitial marking  which is likely from lower lung volumes. No overt interstitial edema, effusion, or pneumothorax. Normal heart size and mediastinal contours. IMPRESSION: Newly coarsened interstitial markings is likely from lower volumes, but cannot exclude developing volume overload/edema. Electronically Signed   By: Monte Fantasia M.D.   On: 12/04/2014 05:53      CULTURES: Blood 11/10 >> CSF 11/10 >>   STUDIES: 11/10 CT head >> advanced atrophy, ventriculomegaly and periventricular white matter disease 11/10 LP >> protein 63, glucose 509, WBC 3, RBC 0 11/11 EEG >> normal drowse state  EVENTS: 11/10 Admit 11/12 Start precedex  DISCUSSION: 59 yo male with alerted mental status and hyperglycemia with possible seizure at home.  Had elevated lactic acid, fever (Tm 101.8) on admission.  He has hx of DM on insulin, Bipolar, chronic pancreatitis, ETOH and withdrawal seizures.  LP negative for infection.  ASSESSMENT/PLAN:  Acute encephalopathy with concern for ETOH withdrawal. Plan: - precedex for RASS goal 0 to -1 - thiamine, folic acid - prn ativan for CIWA > 8 - prn haldol  Uncontrolled DM. Plan: - per primary team  Fever, elevated lactic acid on admission >> ?if from reported seizure as outpt vs sepsis.  No obvious source of infection. Plan: - rocephin day 3 per primary team - f/u procalcitonin >> if negative, then consider d/c Abx  Protein-calorie malnutrition. Plan: - NPO until mental status better - change IV fluid to D5 1/2 NS with 20 meq KCL on 11/12  Hx of Bipolar disease. Plan: - per primary team  Hypokalemia. Plan: - replace electrolytes as needed  CC time 34 minutes.  Chesley Mires, MD [facility] Pulmonary/Critical Care 12/05/2014, 9:17 AM Pager:  [phone] After 3pm call: [phone]

## 2014-12-06 DIAGNOSIS — E1300 Other specified diabetes mellitus with hyperosmolarity without nonketotic hyperglycemic-hyperosmolar coma (NKHHC): Secondary | ICD-10-CM

## 2014-12-06 DIAGNOSIS — F10231 Alcohol dependence with withdrawal delirium: Secondary | ICD-10-CM

## 2014-12-06 LAB — COMPREHENSIVE METABOLIC PANEL
ALT: 20 U/L (ref 17–63)
ANION GAP: 8 (ref 5–15)
AST: 19 U/L (ref 15–41)
Albumin: 2.6 g/dL — ABNORMAL LOW (ref 3.5–5.0)
Alkaline Phosphatase: 132 U/L — ABNORMAL HIGH (ref 38–126)
CHLORIDE: 111 mmol/L (ref 101–111)
CO2: 25 mmol/L (ref 22–32)
CREATININE: 0.57 mg/dL — AB (ref 0.61–1.24)
Calcium: 8.2 mg/dL — ABNORMAL LOW (ref 8.9–10.3)
Glucose, Bld: 169 mg/dL — ABNORMAL HIGH (ref 65–99)
POTASSIUM: 3.3 mmol/L — AB (ref 3.5–5.1)
Sodium: 144 mmol/L (ref 135–145)
Total Bilirubin: 0.8 mg/dL (ref 0.3–1.2)
Total Protein: 5 g/dL — ABNORMAL LOW (ref 6.5–8.1)

## 2014-12-06 LAB — CBC
HEMATOCRIT: 40 % (ref 39.0–52.0)
Hemoglobin: 14.5 g/dL (ref 13.0–17.0)
MCH: 32.7 pg (ref 26.0–34.0)
MCHC: 36.3 g/dL — ABNORMAL HIGH (ref 30.0–36.0)
MCV: 90.3 fL (ref 78.0–100.0)
Platelets: 165 10*3/uL (ref 150–400)
RBC: 4.43 MIL/uL (ref 4.22–5.81)
RDW: 12.5 % (ref 11.5–15.5)
WBC: 11 10*3/uL — AB (ref 4.0–10.5)

## 2014-12-06 LAB — GLUCOSE, CAPILLARY
GLUCOSE-CAPILLARY: 114 mg/dL — AB (ref 65–99)
GLUCOSE-CAPILLARY: 215 mg/dL — AB (ref 65–99)
GLUCOSE-CAPILLARY: 244 mg/dL — AB (ref 65–99)
Glucose-Capillary: 152 mg/dL — ABNORMAL HIGH (ref 65–99)
Glucose-Capillary: 245 mg/dL — ABNORMAL HIGH (ref 65–99)
Glucose-Capillary: 142 mg/dL — ABNORMAL HIGH (ref 65–99)

## 2014-12-06 LAB — PROCALCITONIN

## 2014-12-06 LAB — PHOSPHORUS: Phosphorus: 3.2 mg/dL (ref 2.5–4.6)

## 2014-12-06 LAB — MAGNESIUM: Magnesium: 1.4 mg/dL — ABNORMAL LOW (ref 1.7–2.4)

## 2014-12-06 MED ORDER — INSULIN ASPART 100 UNIT/ML ~~LOC~~ SOLN
0.0000 [IU] | Freq: Every day | SUBCUTANEOUS | Status: DC
  Administered 2014-12-09: 2 [IU] via SUBCUTANEOUS

## 2014-12-06 MED ORDER — INSULIN ASPART 100 UNIT/ML ~~LOC~~ SOLN
0.0000 [IU] | Freq: Three times a day (TID) | SUBCUTANEOUS | Status: DC
  Administered 2014-12-06 – 2014-12-07 (×4): 5 [IU] via SUBCUTANEOUS
  Administered 2014-12-07: 8 [IU] via SUBCUTANEOUS
  Administered 2014-12-08 (×2): 5 [IU] via SUBCUTANEOUS
  Administered 2014-12-08: 11 [IU] via SUBCUTANEOUS
  Administered 2014-12-09 (×2): 5 [IU] via SUBCUTANEOUS
  Administered 2014-12-09: 3 [IU] via SUBCUTANEOUS

## 2014-12-06 MED ORDER — MAGNESIUM SULFATE 2 GM/50ML IV SOLN
2.0000 g | Freq: Once | INTRAVENOUS | Status: AC
  Administered 2014-12-06: 2 g via INTRAVENOUS
  Filled 2014-12-06: qty 50

## 2014-12-06 MED ORDER — INSULIN ASPART 100 UNIT/ML ~~LOC~~ SOLN: 0.0000 [IU] | Freq: Three times a day (TID) | SUBCUTANEOUS | Status: DC

## 2014-12-06 NOTE — Progress Notes (Signed)
Restarted Precedex drip at 1032 am.  Patient became extremely agitated and inconsolable. Patient was attempting to get out of bed, yelling and cursing.  Patient would not be convinced that he is in the hospital.  Patient received ativan 2 mg IV, Haldol 4 mg IV and continued to escalate.  Finally restarted Precedex.  Notified Dr. Halford Chessman, PCCM, that it was necessary to restart the Precedex drip.

## 2014-12-06 NOTE — Progress Notes (Addendum)
Patient ID: GOPAL MALTER, male   DOB: Dec 21, 1955, 59 y.o.   MRN: [id number] [facility] PROGRESS NOTE  TOM MACPHERSON RJJ:[id number] DOB: 06/15/1955 DOA: 12/03/2014 PCP: Juluis Mire, MD  Brief narrative:    59 y.o. male, with past medical history significant for uncontrolled insulin dependent diabetes mellitus , bipolar disorder, chronic pancreatitis, previous history of drug abuse (THC, opiates), alcohol abuse. Patient was found unresponsive by EMS. Apparently he lives with his sister and a niece. They report that his last alcoholic beverage was about a month ago. Patient is altered and not able to provide history of present illness.  On admission, the thought was that patient possibly has viral or bacterial meningitis considering fever, tachycardia, tachypnea, leukocytosis. Lumbar puncture performed, CSF analysis not remarkable for acute infection. Chest x-ray and urinalysis were unremarkable. CT head showed no acute intracranial findings. Alcohol level is within normal limits. UDS was unremarkable.   Blood work was notable for white blood cell count of 11.4, lactic acid of 3.31, potassium of 2, sodium 147, normal creatinine. Additionally, CBG was more than 600, glucose on BMP was more than 700. Ketones were not present in urine. He was started on insulin drip for hyperosmolar hyperglycemia. He was very restless since the admission and was started on CIWA protocol. Critical care has seen him in consultation.  Precedex drip started 12/05/2014.  Assessment/Plan:    Principal problem: Acute encephalopathy / history of alcohol abuse / possible acute alcohol withdrawal with delirium - Patient has history of alcohol abuse however per patient's family report last alcoholic beverage was about a month ago. - Urine drug screen and alcohol level were within normal limits on the admission - CIWA protocol initiated on admission. He continued to be restless and agitated so precedex drip started  12/05/2014 - Please note that on admission, the thought was that the patient may also have had acute bacterial or viral meningitis. Lumbar puncture was performed at the time of the admission. CSF is unremarkable. The antibiotics were started on the admission, acyclovir, vancomycin, Rocephin. We stopped acyclovir and vancomycin 12/04/2014. We continued Rocephin  to treat for possible pneumonia.  - EEG performed 11/11 but no epileptiform activity. Keppra stopped 12/05/2014. - Continue to monitor in step down unit since pt requires precedex drip.  Active Problems: Uncontrolled diabetes mellitus with hyperosmolarity without coma, with long-term current use of insulin (HCC) - CBG of more than 600 on the admission, glucose on BMP more than 700. Ketones were not present in urine. AG was WNL. - Started on insulin drip.  - A1c in 10/2014 was 11.8 indicating poor glycemic control. - Transitioned to subQ insulin 12/04/2014. - CBG's in past 24 hours: 159, 152, 114 - Continue sliding scale insulin for now since pt is not eating   Sepsis due to unspecified organism / leukocytosis / lactic acidosis - Sepsis criteria met on the admission with fever of 101.52F, tachycardia, tachypnea, hypotension, lactic acidosis - Initially thought that patient has acute bacterial or viral meningitis. A lumbar puncture done on the admission but CSF unremarkable. - Acyclovir and vancomycin were stopped 12/04/2014. Pt is still on Rocephin for possible pneumonia.  Hypernatremia - Secondary to dehydration - Sodium improved  With fluids  Hypokalemia - From insulin.  - Supplemented in IV fluids  Severe protein calorie malnutrition - Likely due to acute on chronic illness  - Seen by dietitian   DVT Prophylaxis  - Heparin subQ ordered in hospital   Code Status: Full.  Family Communication:  family not at the bedside this morning  Disposition Plan: remains in SDU because he is on Precedex drip   IV access:  Peripheral  IV  Procedures and diagnostic studies:    Dg Chest 1 View 12/03/2014  Stable age advanced cerebral atrophy, ventriculomegaly and periventricular white matter disease. No acute intracranial findings or mass lesions. Electronically Signed   By: Marijo Sanes M.D.   On: 12/03/2014 19:37   Ct Head Wo Contrast 12/03/2014   Stable age advanced cerebral atrophy, ventriculomegaly and periventricular white matter disease. No acute intracranial findings or mass lesions. Electronically Signed   By: Marijo Sanes M.D.   On: 12/03/2014 19:37   Dg Chest Port 1 View 12/04/2014   Newly coarsened interstitial markings is likely from lower volumes, but cannot exclude developing volume overload/edema. Electronically Signed   By: Monte Fantasia M.D.   On: 12/04/2014 05:53    Medical Consultants:  None   Other Consultants:  None   IAnti-Infectives:   Acyclovir, rocephin, vancomycin, ampicillin. Started on admission and stopped 11/1 except for rocephin. Rocephin 12/03/2014 -->     Leisa Lenz, MD  [facility] Pager [phone]  Time spent in minutes: 25 minutes  If 7PM-7AM, please contact night-coverage [email or web address] Password [facility] 12/06/2014, 6:36 AM   LOS: 3 days    HPI/Subjective: No overnight events. No respiratory distress.   Objective: Filed Vitals:   12/05/14 2200 12/06/14 0000 12/06/14 0200 12/06/14 0400  BP: 137/84 145/82 133/81 118/63  Pulse: 70 67 66 74  Temp: 97.3 F (36.3 C) 98.1 F (36.7 C) 98.2 F (36.8 C) 98.8 F (37.1 C)  TempSrc:      Resp: 20 18 25 15   Height:      Weight:      SpO2: 99% 97% 97% 100%    Intake/Output Summary (Last 24 hours) at 12/06/14 0636 Last data filed at 12/06/14 0600  Gross per 24 hour  Intake 2172.31 ml  Output   2035 ml  Net 137.31 ml    Exam:   General:  Pt is  sleeping, no acute distress  Cardiovascular:  RRR, (+) S1, S2   Respiratory: clear to auscultation bilaterally    Abdomen: (+) BS, non tender, non distended    Extremities: No LE edema, palpable pulses   Neuro: non focal  Data Reviewed: Basic Metabolic Panel:  Recent Labs Lab 12/03/14 2350 12/04/14 0500 12/04/14 0945 12/05/14 0530 12/05/14 0800 12/06/14 0532  NA 147* 145 145 141 144 144  K 2.0* 3.3* 3.0* 2.6* 3.0* 3.3*  CL 112* 111 111 108 112* 111  CO2 26 29 27 27 26 25   GLUCOSE 334* 79 274* 236* 251* 169*  BUN <5* <5* <5* <5* <5* <5*  CREATININE 0.69 0.53* 0.55* 0.62 0.67 0.57*  CALCIUM 8.8* 8.3* 8.2* 8.1* 8.3* 8.2*  MG 1.9  --   --  1.7  --  1.4*  PHOS 1.1*  --   --  3.3  --  3.2   Liver Function Tests:  Recent Labs Lab 12/03/14 1858 12/04/14 0500 12/06/14 0532  AST 33 17 19  ALT 36 23 20  ALKPHOS 205* 117 132*  BILITOT 0.5 0.8 0.8  PROT 7.3 5.3* 5.0*  ALBUMIN 4.1 2.8* 2.6*    Recent Labs Lab 12/03/14 1858  LIPASE 22   No results for input(s): AMMONIA in the last 168 hours. CBC:  Recent Labs Lab 12/03/14 1858 12/03/14 1923 12/04/14 0500 12/06/14 0532  WBC 11.4*  --  15.3* 11.0*  NEUTROABS 10.3*  --   --   --   HGB 17.6* 19.4* 13.4 14.5  HCT 49.0 57.0* 36.2* 40.0  MCV 92.3  --  89.8 90.3  PLT 211  --  184 165   Cardiac Enzymes:  Recent Labs Lab 12/03/14 1858  TROPONINI <0.03   BNP: Invalid input(s): POCBNP CBG:  Recent Labs Lab 12/05/14 1213 12/05/14 1647 12/05/14 1917 12/05/14 2333 12/06/14 0341  GLUCAP 158* 91 159* 152* 114*    Results for orders placed or performed during the hospital encounter of 12/03/14  Urine culture     Status: None   Collection Time: 12/03/14  6:30 PM  Result Value Ref Range Status   Specimen Description URINE, RANDOM  Final   Special Requests NONE  Final   Culture   Final    NO GROWTH 1 DAY Performed at [facility]    Report Status 12/04/2014 FINAL  Final  Blood Culture (routine x 2)     Status: None (Preliminary result)   Collection Time: 12/03/14  6:59 PM  Result Value Ref Range Status   Specimen Description BLOOD RIGHT ANTECUBITAL   Final   Special Requests BOTTLES DRAWN AEROBIC AND ANAEROBIC 5 ML  Final   Culture   Final    NO GROWTH 2 DAYS Performed at [facility]    Report Status PENDING  Incomplete  Blood Culture (routine x 2)     Status: None (Preliminary result)   Collection Time: 12/03/14  7:05 PM  Result Value Ref Range Status   Specimen Description BLOOD LEFT WRIST  Final   Special Requests BOTTLES DRAWN AEROBIC AND ANAEROBIC 5 ML  Final   Culture   Final    NO GROWTH 2 DAYS Performed at [facility]    Report Status PENDING  Incomplete  CSF culture     Status: None (Preliminary result)   Collection Time: 12/03/14  9:39 PM  Result Value Ref Range Status   Specimen Description CSF  Final   Special Requests NONE  Final   Gram Stain   Final    CYTOSPIN WBC PRESENT, PREDOMINANTLY PMN NO ORGANISMS SEEN Gram Stain Report Called to,Read Back By and Verified With: A CHAVIS RN 2255 12/03/14 A NAVARRO    Culture   Final    NO GROWTH 1 DAY Performed at [facility]    Report Status PENDING  Incomplete  MRSA PCR Screening     Status: Abnormal   Collection Time: 12/03/14 10:46 PM  Result Value Ref Range Status   MRSA by PCR POSITIVE (A) NEGATIVE Final    Comment:        The GeneXpert MRSA Assay (FDA approved for NASAL specimens only), is one component of a comprehensive MRSA colonization surveillance program. It is not intended to diagnose MRSA infection nor to guide or monitor treatment for MRSA infections. RESULT CALLED TO, READ BACK BY AND VERIFIED WITH: REEVES,M/2W $RemoveBeforeDEI'[email or web address]$  ON 12/04/14 BY KARCZEWSKI,S.      . antiseptic oral rinse  7 mL Mouth Rinse q12n4p  . cefTRIAXone (ROCEPHIN)  IV  1 g Intravenous Q24H  . chlorhexidine  15 mL Mouth Rinse BID  . Chlorhexidine Gluconate Cloth  6 each Topical Q0600  . folic acid  1 mg Intravenous Daily  . heparin subcutaneous  5,000 Units Subcutaneous 3 times per day  . insulin aspart  0-15 Units Subcutaneous 6 times per day  .  multivitamin with minerals  1 tablet Oral Daily  .  mupirocin ointment  1 application Nasal BID  . sodium chloride  3 mL Intravenous Q12H  . thiamine  100 mg Intravenous Daily     Continuous Infusions: . dexmedetomidine 0.5 mcg/kg/hr (12/06/14 0116)  . dextrose 5 % and 0.45 % NaCl with KCl 20 mEq/L 75 mL/hr at 12/05/14 2340

## 2014-12-06 NOTE — Progress Notes (Signed)
PCCM PROGRESS NOTE  ADMISSION DATE: 12/03/2014 CONSULT DATE: 12/04/2014 REFERRING PROVIDER: Dr. Charlies Silvers  CC: Altered mental status  SUBJECTIVE: Doesn't remember what happened.  Denies chest/abdominal pain.  Feels hungry.  OBJECTIVE: VITALS: BP 137/87 mmHg  Pulse 75  Temp(Src) 99.3 F (37.4 C) (Other (Comment))  Resp 24  Ht 5\' 4"  (1.626 m)  Wt 125 lb 3.5 oz (56.8 kg)  BMI 21.48 kg/m2  SpO2 99%  INTAKE/OUTPUT: I/O last 3 completed shifts: In: 2584.8 [I.V.:2124.8; IV Piggyback:460] Out: 2307 [Urine:2307]  General: pleasant HEENT: pupils reactive, dry mucosa Cardiac: regular, no murmur Chest: no wheeze Abd: soft, non tender Ext: no edema Neuro: alert, follows commands, moves extremities, mumbles but able to state name Skin: no rashes   CMP Latest Ref Rng 12/06/2014 12/05/2014 12/05/2014  Glucose 65 - 99 mg/dL 169(H) 251(H) 236(H)  BUN 6 - 20 mg/dL <5(L) <5(L) <5(L)  Creatinine 0.61 - 1.24 mg/dL 0.57(L) 0.67 0.62  Sodium 135 - 145 mmol/L 144 144 141  Potassium 3.5 - 5.1 mmol/L 3.3(L) 3.0(L) 2.6(LL)  Chloride 101 - 111 mmol/L 111 112(H) 108  CO2 22 - 32 mmol/L 25 26 27   Calcium 8.9 - 10.3 mg/dL 8.2(L) 8.3(L) 8.1(L)  Total Protein 6.5 - 8.1 g/dL 5.0(L) - -  Total Bilirubin 0.3 - 1.2 mg/dL 0.8 - -  Alkaline Phos 38 - 126 U/L 132(H) - -  AST 15 - 41 U/L 19 - -  ALT 17 - 63 U/L 20 - -    CBC Latest Ref Rng 12/06/2014 12/04/2014 12/03/2014  WBC 4.0 - 10.5 K/uL 11.0(H) 15.3(H) -  Hemoglobin 13.0 - 17.0 g/dL 14.5 13.4 19.4(H)  Hematocrit 39.0 - 52.0 % 40.0 36.2(L) 57.0(H)  Platelets 150 - 400 K/uL 165 184 -    CBG (last 3)   Recent Labs  12/05/14 2333 12/06/14 0341 12/06/14 0815  GLUCAP 152* 114* 215*   IMAGING: No results found.   CULTURES: Blood 11/10 >> CSF 11/10 >>   STUDIES: 11/10 CT head >> advanced atrophy, ventriculomegaly and periventricular white matter disease 11/10 LP >> protein 63, glucose 509, WBC 3, RBC 0 11/11 EEG >> normal drowse  state  EVENTS: 11/10 Admit 11/12 Start precedex 11/13 Off precedex  DISCUSSION: 59 yo male with alerted mental status and hyperglycemia with possible seizure at home.  Had elevated lactic acid, fever (Tm 101.8) on admission.  He has hx of DM on insulin, Bipolar, chronic pancreatitis, ETOH and withdrawal seizures.  LP negative for infection.  ASSESSMENT/PLAN:  Acute encephalopathy with concern for ETOH withdrawal >> improved 11/13. Possible ETOH withdrawal seizure prior to admission. Plan: - thiamine, folic acid - prn ativan for CIWA > 8, seizure - prn haldol - try to avoid restarting precedex if possible  Uncontrolled DM. Plan: - per primary team  Fever, elevated lactic acid on admission >> More likely from reported seizure as outpt rather than sepsis.  No obvious source of infection, and procalcitonin negative. Plan: - d/c rocephin 11/13  Protein-calorie malnutrition. Plan: - advance diet as tolerated now that mental status better - continue IV fluid D5 1/2 NS with 20 meq KCL at 50 ml/hr  Hx of Bipolar disease. Plan: - per primary team  Hypokalemia. Hypomagnesemia. Plan: - replace electrolytes as needed   Chesley Mires, MD [place] 12/06/2014, 8:27 AM Pager:  [phone] After 3pm call: [phone]

## 2014-12-07 ENCOUNTER — Inpatient Hospital Stay (HOSPITAL_COMMUNITY): Payer: Medicaid Other

## 2014-12-07 DIAGNOSIS — T68XXXA Hypothermia, initial encounter: Secondary | ICD-10-CM | POA: Diagnosis present

## 2014-12-07 LAB — GLUCOSE, CAPILLARY
GLUCOSE-CAPILLARY: 234 mg/dL — AB (ref 65–99)
GLUCOSE-CAPILLARY: 256 mg/dL — AB (ref 65–99)
GLUCOSE-CAPILLARY: 60 mg/dL — AB (ref 65–99)
Glucose-Capillary: 73 mg/dL (ref 65–99)
Glucose-Capillary: 193 mg/dL — ABNORMAL HIGH (ref 65–99)

## 2014-12-07 LAB — BASIC METABOLIC PANEL
Anion gap: 9 (ref 5–15)
CHLORIDE: 103 mmol/L (ref 101–111)
CO2: 29 mmol/L (ref 22–32)
Calcium: 8.2 mg/dL — ABNORMAL LOW (ref 8.9–10.3)
Creatinine, Ser: 0.58 mg/dL — ABNORMAL LOW (ref 0.61–1.24)
GFR calc non Af Amer: >60 mL/min (ref >60)
Glucose, Bld: 235 mg/dL — ABNORMAL HIGH (ref 65–99)
Potassium: 3 mmol/L — ABNORMAL LOW (ref 3.5–5.1)
SODIUM: 141 mmol/L (ref 135–145)

## 2014-12-07 LAB — URINALYSIS, ROUTINE W REFLEX MICROSCOPIC
Bilirubin Urine: NEGATIVE
Glucose, UA: >1000 mg/dL — AB
Hgb urine dipstick: NEGATIVE
KETONES UR: 15 mg/dL — AB
LEUKOCYTES UA: NEGATIVE
NITRITE: NEGATIVE
PROTEIN: NEGATIVE mg/dL
Specific Gravity, Urine: 1.013 (ref 1.005–1.030)
Urobilinogen, UA: 0.2 mg/dL (ref 0.0–1.0)
pH: 7 (ref 5.0–8.0)

## 2014-12-07 LAB — CSF CULTURE

## 2014-12-07 LAB — CSF CULTURE W GRAM STAIN: Culture: NO GROWTH

## 2014-12-07 LAB — URINE MICROSCOPIC-ADD ON

## 2014-12-07 LAB — MAGNESIUM: Magnesium: 1.5 mg/dL — ABNORMAL LOW (ref 1.7–2.4)

## 2014-12-07 LAB — LACTIC ACID, PLASMA: LACTIC ACID, VENOUS: 1.4 mmol/L (ref 0.5–2.0)

## 2014-12-07 MED ORDER — CETYLPYRIDINIUM CHLORIDE 0.05 % MT LIQD
7.0000 mL | Freq: Two times a day (BID) | OROMUCOSAL | Status: DC
  Administered 2014-12-07 – 2014-12-10 (×5): 7 mL via OROMUCOSAL

## 2014-12-07 MED ORDER — MAGNESIUM SULFATE 2 GM/50ML IV SOLN
2.0000 g | Freq: Once | INTRAVENOUS | Status: AC
  Administered 2014-12-07: 2 g via INTRAVENOUS
  Filled 2014-12-07: qty 50

## 2014-12-07 MED ORDER — POTASSIUM CHLORIDE 10 MEQ/100ML IV SOLN
10.0000 meq | INTRAVENOUS | Status: AC
  Administered 2014-12-07 (×3): 10 meq via INTRAVENOUS
  Filled 2014-12-07 (×3): qty 100

## 2014-12-07 MED ORDER — DEXMEDETOMIDINE HCL IN NACL 200 MCG/50ML IV SOLN
0.4000 ug/kg/h | INTRAVENOUS | Status: DC
  Administered 2014-12-07: 0.4 ug/kg/h via INTRAVENOUS
  Administered 2014-12-07: 0.9 ug/kg/h via INTRAVENOUS
  Administered 2014-12-07: 0.7 ug/kg/h via INTRAVENOUS
  Filled 2014-12-07: qty 50

## 2014-12-07 MED ORDER — SODIUM CHLORIDE 0.9 % IV BOLUS (SEPSIS)
1000.0000 mL | Freq: Once | INTRAVENOUS | Status: AC
  Administered 2014-12-07: 1000 mL via INTRAVENOUS

## 2014-12-07 NOTE — Progress Notes (Signed)
PCCM PROGRESS NOTE  ADMISSION DATE: 12/03/2014 CONSULT DATE: 12/04/2014 REFERRING PROVIDER: Dr. Charlies Silvers  CC: Altered mental status  SUBJECTIVE: Sedated. Restarted on Precedex yesterday.   OBJECTIVE: VITALS: BP 153/85 mmHg  Pulse 68  Temp(Src) 99.3 F (37.4 C) (Core (Comment))  Resp 23  Ht 5\' 4"  (1.626 m)  Wt 132 lb 4.4 oz (60 kg)  BMI 22.69 kg/m2  SpO2 99%  INTAKE/OUTPUT: I/O last 3 completed shifts: In: 3214 [P.O.:400; I.V.:2564; IV Piggyback:250] Out: [id number] [Urine:4375]  General: Sleeping, non responsive. HEENT: pupils reactive, Cardiac: regular, no MRG Chest: no wheeze, crackles Abd: soft, non tender, non distended Ext: no edema Skin: no rashes   CMP Latest Ref Rng 12/07/2014 12/06/2014 12/05/2014  Glucose 65 - 99 mg/dL 235(H) 169(H) 251(H)  BUN 6 - 20 mg/dL <5(L) <5(L) <5(L)  Creatinine 0.61 - 1.24 mg/dL 0.58(L) 0.57(L) 0.67  Sodium 135 - 145 mmol/L 141 144 144  Potassium 3.5 - 5.1 mmol/L 3.0(L) 3.3(L) 3.0(L)  Chloride 101 - 111 mmol/L 103 111 112(H)  CO2 22 - 32 mmol/L 29 25 26   Calcium 8.9 - 10.3 mg/dL 8.2(L) 8.2(L) 8.3(L)  Total Protein 6.5 - 8.1 g/dL - 5.0(L) -  Total Bilirubin 0.3 - 1.2 mg/dL - 0.8 -  Alkaline Phos 38 - 126 U/L - 132(H) -  AST 15 - 41 U/L - 19 -  ALT 17 - 63 U/L - 20 -    CBC Latest Ref Rng 12/06/2014 12/04/2014 12/03/2014  WBC 4.0 - 10.5 K/uL 11.0(H) 15.3(H) -  Hemoglobin 13.0 - 17.0 g/dL 14.5 13.4 19.4(H)  Hematocrit 39.0 - 52.0 % 40.0 36.2(L) 57.0(H)  Platelets 150 - 400 K/uL 165 184 -    CBG (last 3)   Recent Labs  12/06/14 1701 12/06/14 2053 12/07/14 0809  GLUCAP 244* 142* 234*   IMAGING: Dg Chest Port 1 View  12/07/2014  CLINICAL DATA:  Patient admitted for altered mental status and hyperglycemia. History of hypertension and diabetes. EXAM: PORTABLE CHEST 1 VIEW COMPARISON:  12/04/2014 FINDINGS: There is bilateral vascular congestion, increased from the previous exam. There is no area of lung consolidation to  suggest pneumonia. No pleural effusion or gross pneumothorax on this supine exam. Heart, mediastinum and hila are unremarkable. Bony thorax is intact. IMPRESSION: 1. No acute cardiopulmonary disease. 2. Vascular congestion without overt pulmonary edema. No evidence of pneumonia. Electronically Signed   By: Lajean Manes M.D.   On: 12/07/2014 07:56     CULTURES: Blood 11/10 >> CSF 11/10 >>   STUDIES: 11/10 CT head >> advanced atrophy, ventriculomegaly and periventricular white matter disease 11/10 LP >> protein 63, glucose 509, WBC 3, RBC 0 11/11 EEG >> normal drowse state  EVENTS: 11/10 Admit 11/12 Start precedex 11/13 Off precedex 11/14 Restarted precedex  DISCUSSION: 59 yo male with alerted mental status and hyperglycemia with possible seizure at home.  Had elevated lactic acid, fever (Tm 101.8) on admission.  He has hx of DM on insulin, Bipolar, chronic pancreatitis, ETOH and withdrawal seizures.  LP negative for infection.  ASSESSMENT/PLAN:  Acute encephalopathy with concern for ETOH withdrawal. Possible ETOH withdrawal seizure prior to admission. Plan: - thiamine, folic acid - prn ativan for CIWA > 8, seizure - prn haldol - Wean off the precedex today.  Uncontrolled DM. Plan: - per primary team  Fever, elevated lactic acid on admission >> More likely from reported seizure as outpt rather than sepsis.  No obvious source of infection, and procalcitonin negative. Plan: - Off rocephin 11/13  Protein-calorie  malnutrition. Plan: - advance diet as tolerated when he is more awake. - continue IV fluid D5 1/2 NS with 20 meq KCL at 50 ml/hr  Hx of Bipolar disease. Plan: - per primary team  Hypokalemia. Hypomagnesemia. Plan: - replace electrolytes as needed   Marshell Garfinkel MD [facility] Pager [phone] If no answer or after 3pm call: [phone] 12/07/2014, 9:46 AM

## 2014-12-07 NOTE — Progress Notes (Signed)
eLink Physician-Brief Progress Note Patient Name: Michael [name] DOB: Jan 16, 1956 MRN: [id number]   Date of Service  12/07/2014  HPI/Events of Note  Hypotension. BP = 60/43 with MAP = 46.  eICU Interventions  Will order: 1. Hold Precedex IV infusion. 2. 0.9 NaCl 1 liter IV of 1 hour now.      Intervention Category Major Interventions: Hypotension - evaluation and management  Sommer,Steven Eugene 12/07/2014, 4:32 PM

## 2014-12-07 NOTE — Progress Notes (Signed)
Date: December 07, 2014 Chart reviewed for concurrent status and case management needs. Will continue to follow patient for changes and needs: Iv precedex drip for etoh w/d and protocol Velva Harman, RN, BSN, [place]   [phone]

## 2014-12-07 NOTE — Progress Notes (Addendum)
Patient ID: JEFFRY VOGELSANG, male   DOB: 1956-01-09, 59 y.o.   MRN: [id number] [facility] PROGRESS NOTE  JEROMIAH OHALLORAN QQP:[id number] DOB: 1955-10-21 DOA: 12/03/2014 PCP: Juluis Mire, MD  Brief narrative:    59 y.o. male, with past medical history significant for uncontrolled insulin dependent diabetes mellitus , bipolar disorder, chronic pancreatitis, previous history of drug abuse (THC, opiates), alcohol abuse. Patient was found unresponsive by EMS. Apparently he lives with his sister and a niece. They reported that his last alcoholic beverage was about a month ago.   On admission, the thought was that patient possibly has viral or bacterial meningitis considering fever, tachycardia, tachypnea, leukocytosis. Lumbar puncture performed, CSF analysis not remarkable for acute infection. Chest x-ray and urinalysis were unremarkable. CT head showed no acute intracranial findings. Alcohol level was within normal limits. UDS was unremarkable.  Blood work was notable for white blood cell count of 11.4, lactic acid of 3.31, potassium of 2, sodium 147, normal creatinine. Additionally, CBG was more than 600, glucose on BMP was more than 700. Ketones were not present in urine. He was started on insulin drip for hyperosmolar hyperglycemia.  Since the admission, patient was very restless even though he was on CIWA protocol. Critical care has seen him in consultation. He was subsequently started on precedex drip 12/05/2014. Patient's hospital course is complicated with episodes of hypothermia. He has required bear hugger on 12/06/2014.   Assessment/Plan:    Principal problem: Acute encephalopathy / history of alcohol abuse / possible acute alcohol withdrawal with delirium - unclear etiology. Looks like withdrawal delirium from alcohol however patient's family reported last alcoholic beverage about a month ago. - Urine drug screen and alcohol level were unremarkable on this admission - Patient  restless even while on CIWA protocol so precedex started 12/05/2014 - Appreciate critical care seen the patient in consultation - As mentioned above, on admission, the thought was that the patient may also have had acute bacterial or viral meningitis. Lumbar puncture was performed at the time of the admission. CSF is unremarkable. The antibiotics were started on the admission, acyclovir, vancomycin, Rocephin. We stopped acyclovir and vancomycin 12/04/2014. We continued Rocephin  to treat for possible pneumonia.  - EEG performed 11/11 but no epileptiform activity. Keppra stopped 12/05/2014. - Continue to monitor in step down unit while pt requires precedex drip.  Active Problems: Sepsis due to unspecified organism / leukocytosis / lactic acidosis / Hypothermia - Sepsis criteria met on the admission with fever of 101.4F, tachycardia, tachypnea, hypotension, lactic acidosis - Initially thought pt has acute bacterial or viral meningitis.He was started on acyclovir, vanco and rocephin.  A lumbar puncture done on the admission and CSF analysis unremarkable. - Acyclovir and vancomycin were stopped 12/04/2014. Rocephin stopped 12/06/2014. - Patient was hypothermic overnight and we collected urinalysis, lactic acid, all show no signs of acute infectious process.chest x-ray does not show acute cardiopulmonary process. He required bear hugger overnight.  - Will se if PCCM think he needs abx empirically or to watch for 24 hours. If he does develop fever or hypothermia i would be inclined to start empiric broad spectrum abx.  Uncontrolled diabetes mellitus with hyperosmolarity without coma, with long-term current use of insulin ([place] [employer]) - On admission, CBG of more than 600, glucose on BMP more than 700. Ketones were not present in urine. AG was WNL. - Started on insulin drip on admission - A1c in 10/2014 was 11.8 indicating poor glycemic control. - Transitioned to subQ insulin 12/04/2014. -  CBG's in past 24  hours: 244, 142, 234 - Continue sliding scale insulin  Hypernatremia - Secondary to dehydration - Sodium WNL  Hypokalemia - From insulin.  - Supplemented   Hypomagnesemia - Due to poor nutritional status - Supplemented  - Check magnesium daily   Severe protein calorie malnutrition - Likely due to acute on chronic illness  - Nutrition consulted   DVT Prophylaxis  - Heparin subQ  Code Status: Full.  Family Communication:  family not at the bedside this morning  Disposition Plan: remains in SDU while on Precedex drip   IV access:  Peripheral IV  Procedures and diagnostic studies:     Dg Chest Port 1 View 12/07/2014   1. No acute cardiopulmonary disease. 2. Vascular congestion without overt pulmonary edema. No evidence of pneumonia.   Dg Chest 1 View 12/03/2014  Stable age advanced cerebral atrophy, ventriculomegaly and periventricular white matter disease. No acute intracranial findings or mass lesions. Electronically Signed   By: Marijo Sanes M.D.   On: 12/03/2014 19:37   Ct Head Wo Contrast 12/03/2014   Stable age advanced cerebral atrophy, ventriculomegaly and periventricular white matter disease. No acute intracranial findings or mass lesions. Electronically Signed   By: Marijo Sanes M.D.   On: 12/03/2014 19:37   Dg Chest Port 1 View 12/04/2014   Newly coarsened interstitial markings is likely from lower volumes, but cannot exclude developing volume overload/edema. Electronically Signed   By: Monte Fantasia M.D.   On: 12/04/2014 05:53    Medical Consultants:  PCCM  Other Consultants:  None   IAnti-Infectives:   Acyclovir, rocephin, vancomycin, ampicillin. Started on admission and stopped 11/1 except for rocephin. Rocephin 12/03/2014 -->  12/06/2014   Leisa Lenz, MD  [facility] Pager [phone]  Time spent in minutes: 25 minutes  If 7PM-7AM, please contact night-coverage [email or web address] Password [email or web address] 12/07/2014, 9:32 AM   LOS: 4 days     HPI/Subjective: No overnight events. Hypothermic and required bear hugger earlier this am.  Objective: Filed Vitals:   12/07/14 0500 12/07/14 0600 12/07/14 0700 12/07/14 0800  BP: 145/81 127/85 153/85   Pulse: 55 76 68   Temp: 95.5 F (35.3 C) 96.1 F (35.6 C) 97.2 F (36.2 C) 99.3 F (37.4 C)  TempSrc:    Core (Comment)  Resp: 18 22 23    Height:      Weight: 60 kg (132 lb 4.4 oz)     SpO2: 100% 100% 99%     Intake/Output Summary (Last 24 hours) at 12/07/14 0932 Last data filed at 12/07/14 0654  Gross per 24 hour  Intake 1589.17 ml  Output   2750 ml  Net -1160.83 ml    Exam:   General:  Pt is not in acute distress   Cardiovascular:  Rate controlled. Appreciate S1, S2    Respiratory: no wheezing, no rhonchi  Abdomen: non tender abdomen, appreciate bowel sounds  Extremities: No leg swelling, bilateral pulses appreciated  Neuro: no focal neurological deficits  Data Reviewed: Basic Metabolic Panel:  Recent Labs Lab 12/03/14 2350  12/04/14 0945 12/05/14 0530 12/05/14 0800 12/06/14 0532 12/07/14 0440  NA 147*  < > 145 141 144 144 141  K 2.0*  < > 3.0* 2.6* 3.0* 3.3* 3.0*  CL 112*  < > 111 108 112* 111 103  CO2 26  < > 27 27 26 25 29   GLUCOSE 334*  < > 274* 236* 251* 169* 235*  BUN <5*  < > <5* <5* <5* <5* <  5*  CREATININE 0.69  < > 0.55* 0.62 0.67 0.57* 0.58*  CALCIUM 8.8*  < > 8.2* 8.1* 8.3* 8.2* 8.2*  MG 1.9  --   --  1.7  --  1.4* 1.5*  PHOS 1.1*  --   --  3.3  --  3.2  --   < > = values in this interval not displayed. Liver Function Tests:  Recent Labs Lab 12/03/14 1858 12/04/14 0500 12/06/14 0532  AST 33 17 19  ALT 36 23 20  ALKPHOS 205* 117 132*  BILITOT 0.5 0.8 0.8  PROT 7.3 5.3* 5.0*  ALBUMIN 4.1 2.8* 2.6*    Recent Labs Lab 12/03/14 1858  LIPASE 22   No results for input(s): AMMONIA in the last 168 hours. CBC:  Recent Labs Lab 12/03/14 1858 12/03/14 1923 12/04/14 0500 12/06/14 0532  WBC 11.4*  --  15.3* 11.0*   NEUTROABS 10.3*  --   --   --   HGB 17.6* 19.4* 13.4 14.5  HCT 49.0 57.0* 36.2* 40.0  MCV 92.3  --  89.8 90.3  PLT 211  --  184 165   Cardiac Enzymes:  Recent Labs Lab 12/03/14 1858  TROPONINI <0.03   BNP: Invalid input(s): POCBNP CBG:  Recent Labs Lab 12/06/14 0815 12/06/14 1214 12/06/14 1701 12/06/14 2053 12/07/14 0809  GLUCAP 215* 245* 244* 142* 234*    Results for orders placed or performed during the hospital encounter of 12/03/14  Urine culture     Status: None   Collection Time: 12/03/14  6:30 PM  Result Value Ref Range Status   Specimen Description URINE, RANDOM  Final   Special Requests NONE  Final   Culture   Final    NO GROWTH 1 DAY Performed at [facility]    Report Status 12/04/2014 FINAL  Final  Blood Culture (routine x 2)     Status: None (Preliminary result)   Collection Time: 12/03/14  6:59 PM  Result Value Ref Range Status   Specimen Description BLOOD RIGHT ANTECUBITAL  Final   Special Requests BOTTLES DRAWN AEROBIC AND ANAEROBIC 5 ML  Final   Culture   Final    NO GROWTH 3 DAYS Performed at [facility]    Report Status PENDING  Incomplete  Blood Culture (routine x 2)     Status: None (Preliminary result)   Collection Time: 12/03/14  7:05 PM  Result Value Ref Range Status   Specimen Description BLOOD LEFT WRIST  Final   Special Requests BOTTLES DRAWN AEROBIC AND ANAEROBIC 5 ML  Final   Culture   Final    NO GROWTH 3 DAYS Performed at [facility]    Report Status PENDING  Incomplete  CSF culture     Status: None (Preliminary result)   Collection Time: 12/03/14  9:39 PM  Result Value Ref Range Status   Specimen Description CSF  Final   Special Requests NONE  Final   Gram Stain   Final    CYTOSPIN WBC PRESENT, PREDOMINANTLY PMN NO ORGANISMS SEEN Gram Stain Report Called to,Read Back By and Verified With: A CHAVIS RN [id number] 12/03/14 A NAVARRO    Culture   Final    NO GROWTH 2 DAYS Performed at [facility]    Report Status PENDING  Incomplete  MRSA PCR Screening     Status: Abnormal   Collection Time: 12/03/14 10:46 PM  Result Value Ref Range Status   MRSA by PCR POSITIVE (A) NEGATIVE Final  Comment:        The GeneXpert MRSA Assay (FDA approved for NASAL specimens only), is one component of a comprehensive MRSA colonization surveillance program. It is not intended to diagnose MRSA infection nor to guide or monitor treatment for MRSA infections. RESULT CALLED TO, READ BACK BY AND VERIFIED WITH: REEVES,M/2W @0040  ON 12/04/14 BY KARCZEWSKI,S.      . antiseptic oral rinse  7 mL Mouth Rinse q12n4p  . chlorhexidine  15 mL Mouth Rinse BID  . Chlorhexidine Gluconate Cloth  6 each Topical Q0600  . folic acid  1 mg Intravenous Daily  . heparin subcutaneous  5,000 Units Subcutaneous 3 times per day  . insulin aspart  0-15 Units Subcutaneous TID WC  . insulin aspart  0-5 Units Subcutaneous QHS  . multivitamin with minerals  1 tablet Oral Daily  . mupirocin ointment  1 application Nasal BID  . sodium chloride  3 mL Intravenous Q12H  . thiamine  100 mg Intravenous Daily     Continuous Infusions: . dexmedetomidine 0.5 mcg/kg/hr (12/07/14 0830)  . dextrose 5 % and 0.45 % NaCl with KCl 20 mEq/L 50 mL/hr (12/06/14 1544)

## 2014-12-07 NOTE — Progress Notes (Signed)
Inpatient Diabetes Program Recommendations  AACE/ADA: New Consensus Statement on Inpatient Glycemic Control (2015)  Target Ranges:  Prepandial:   less than 140 mg/dL      Peak postprandial:   less than 180 mg/dL (1-2 hours)      Critically ill patients:  140 - 180 mg/dL   Review of Glycemic Control Results for Michael [name], Michael [name] (MRN [id number]) as of 12/07/2014 13:38  Ref. Range 12/06/2014 12:14 12/06/2014 17:01 12/06/2014 20:53 12/07/2014 08:09 12/07/2014 12:14  Glucose-Capillary Latest Ref Range: 65-99 mg/dL 245 (H) 244 (H) 142 (H) 234 (H) 256 (H)   Uncontrolled blood sugars on Novolog moderate Q4H. Would likely benefit from addition of basal insulin.   Inpatient Diabetes Program Recommendations:     Consider addition of Levemir 8 units QHS.  Will continue to follow. Thank you. Lorenda Peck, RD, LDN, CDE Inpatient Diabetes Coordinator [phone]

## 2014-12-07 NOTE — Progress Notes (Signed)
Pt's blood pressure dropped to 45/37 on precedex 1.2 mg/kg/min. Notified Dr. Charlies Silvers and started pt on 1 liter bolus NS. E-link notified. Pressure is beginning to come back up.

## 2014-12-08 DIAGNOSIS — T68XXXS Hypothermia, sequela: Secondary | ICD-10-CM

## 2014-12-08 LAB — CULTURE, BLOOD (ROUTINE X 2)
Culture: NO GROWTH
Culture: NO GROWTH

## 2014-12-08 LAB — BASIC METABOLIC PANEL
Anion gap: 8 (ref 5–15)
BUN: <5 mg/dL — ABNORMAL LOW (ref 6–20)
CALCIUM: 8.3 mg/dL — AB (ref 8.9–10.3)
CO2: 25 mmol/L (ref 22–32)
Chloride: 104 mmol/L (ref 101–111)
GLUCOSE: 288 mg/dL — AB (ref 65–99)
Potassium: 3.1 mmol/L — ABNORMAL LOW (ref 3.5–5.1)
Sodium: 137 mmol/L (ref 135–145)

## 2014-12-08 LAB — GLUCOSE, CAPILLARY
GLUCOSE-CAPILLARY: 227 mg/dL — AB (ref 65–99)
GLUCOSE-CAPILLARY: 224 mg/dL — AB (ref 65–99)
GLUCOSE-CAPILLARY: 185 mg/dL — AB (ref 65–99)
Glucose-Capillary: 311 mg/dL — ABNORMAL HIGH (ref 65–99)

## 2014-12-08 LAB — CBC
HCT: 37.2 % — ABNORMAL LOW (ref 39.0–52.0)
Hemoglobin: 13.2 g/dL (ref 13.0–17.0)
MCH: 32.2 pg (ref 26.0–34.0)
MCHC: 35.5 g/dL (ref 30.0–36.0)
MCV: 90.7 fL (ref 78.0–100.0)
PLATELETS: 158 10*3/uL (ref 150–400)
RBC: 4.1 MIL/uL — AB (ref 4.22–5.81)
RDW: 12.5 % (ref 11.5–15.5)
WBC: 8.6 10*3/uL (ref 4.0–10.5)

## 2014-12-08 LAB — MAGNESIUM: Magnesium: 1.4 mg/dL — ABNORMAL LOW (ref 1.7–2.4)

## 2014-12-08 MED ORDER — VITAMIN B-1 100 MG PO TABS
100.0000 mg | ORAL_TABLET | Freq: Every day | ORAL | Status: DC
  Administered 2014-12-09 – 2014-12-10 (×2): 100 mg via ORAL
  Filled 2014-12-08 (×2): qty 1

## 2014-12-08 MED ORDER — POTASSIUM CHLORIDE 10 MEQ/50ML IV SOLN
10.0000 meq | INTRAVENOUS | Status: AC
  Administered 2014-12-08 (×4): 10 meq via INTRAVENOUS
  Filled 2014-12-08 (×4): qty 50

## 2014-12-08 MED ORDER — MAGNESIUM SULFATE 2 GM/50ML IV SOLN
2.0000 g | Freq: Once | INTRAVENOUS | Status: AC
  Administered 2014-12-08: 2 g via INTRAVENOUS
  Filled 2014-12-08: qty 50

## 2014-12-08 MED ORDER — KETOROLAC TROMETHAMINE 30 MG/ML IJ SOLN
30.0000 mg | Freq: Four times a day (QID) | INTRAMUSCULAR | Status: DC | PRN
  Administered 2014-12-08 – 2014-12-10 (×3): 30 mg via INTRAVENOUS
  Filled 2014-12-08 (×2): qty 1

## 2014-12-08 MED ORDER — KETOROLAC TROMETHAMINE 30 MG/ML IJ SOLN: 30.0000 mg | Freq: Four times a day (QID) | INTRAMUSCULAR | Status: DC

## 2014-12-08 MED ORDER — FOLIC ACID 1 MG PO TABS
1.0000 mg | ORAL_TABLET | Freq: Every day | ORAL | Status: DC
  Administered 2014-12-09 – 2014-12-10 (×2): 1 mg via ORAL
  Filled 2014-12-08 (×2): qty 1

## 2014-12-08 MED ORDER — KETOROLAC TROMETHAMINE 30 MG/ML IJ SOLN
INTRAMUSCULAR | Status: AC
  Filled 2014-12-08: qty 1

## 2014-12-08 MED ORDER — OXYCODONE-ACETAMINOPHEN 5-325 MG PO TABS
2.0000 | ORAL_TABLET | Freq: Once | ORAL | Status: AC
  Administered 2014-12-08: 2 via ORAL
  Filled 2014-12-08: qty 2

## 2014-12-08 NOTE — Progress Notes (Signed)
PCCM PROGRESS NOTE  ADMISSION DATE: 12/03/2014 CONSULT DATE: 12/04/2014 REFERRING PROVIDER: Dr. Charlies Silvers  CC: Altered mental status  SUBJECTIVE: Hypotensive overnight. Precedex stopped and given IVF. Mental status is much better today.  OBJECTIVE: VITALS: BP 143/69 mmHg  Pulse 130  Temp(Src) 99 F (37.2 C) (Core (Comment))  Resp 17  Ht 5\' 4"  (1.626 m)  Wt 132 lb 4.4 oz (60 kg)  BMI 22.69 kg/m2  SpO2 97%  INTAKE/OUTPUT: I/O last 3 completed shifts: In: 4393.4 [I.V.:2193.4; IV Piggyback:2200] Out: [id number]  General: Awake, Alert, Chatty HEENT: pupils reactive, Cardiac: regular, no MRG Chest: no wheeze, crackles Abd: soft, non tender, non distended Ext: no edema Skin: no rashes   CMP Latest Ref Rng 12/08/2014 12/07/2014 12/06/2014  Glucose 65 - 99 mg/dL 288(H) 235(H) 169(H)  BUN 6 - 20 mg/dL <5(L) <5(L) <5(L)  Creatinine 0.61 - 1.24 mg/dL <0.30(L) 0.58(L) 0.57(L)  Sodium 135 - 145 mmol/L 137 141 144  Potassium 3.5 - 5.1 mmol/L 3.1(L) 3.0(L) 3.3(L)  Chloride 101 - 111 mmol/L 104 103 111  CO2 22 - 32 mmol/L 25 29 25   Calcium 8.9 - 10.3 mg/dL 8.3(L) 8.2(L) 8.2(L)  Total Protein 6.5 - 8.1 g/dL - - 5.0(L)  Total Bilirubin 0.3 - 1.2 mg/dL - - 0.8  Alkaline Phos 38 - 126 U/L - - 132(H)  AST 15 - 41 U/L - - 19  ALT 17 - 63 U/L - - 20    CBC Latest Ref Rng 12/08/2014 12/06/2014 12/04/2014  WBC 4.0 - 10.5 K/uL 8.6 11.0(H) 15.3(H)  Hemoglobin 13.0 - 17.0 g/dL 13.2 14.5 13.4  Hematocrit 39.0 - 52.0 % 37.2(L) 40.0 36.2(L)  Platelets 150 - 400 K/uL 158 165 184    CBG (last 3)   Recent Labs  12/07/14 1739 12/07/14 2136 12/08/14 0748  GLUCAP 73 193* 311*   IMAGING: Dg Chest Port 1 View  12/07/2014  CLINICAL DATA:  Patient admitted for altered mental status and hyperglycemia. History of hypertension and diabetes. EXAM: PORTABLE CHEST 1 VIEW COMPARISON:  12/04/2014 FINDINGS: There is bilateral vascular congestion, increased from the previous exam. There is no  area of lung consolidation to suggest pneumonia. No pleural effusion or gross pneumothorax on this supine exam. Heart, mediastinum and hila are unremarkable. Bony thorax is intact. IMPRESSION: 1. No acute cardiopulmonary disease. 2. Vascular congestion without overt pulmonary edema. No evidence of pneumonia. Electronically Signed   By: Lajean Manes M.D.   On: 12/07/2014 07:56     CULTURES: Blood 11/10 >> CSF 11/10 >>   STUDIES: 11/10 CT head >> advanced atrophy, ventriculomegaly and periventricular white matter disease 11/10 LP >> protein 63, glucose 509, WBC 3, RBC 0 11/11 EEG >> normal drowse state  EVENTS: 11/10 Admit 11/12 Start precedex 11/13 Off precedex 11/14 Restarted precedex 11/15 Hypotensive, off precedex  DISCUSSION: 59 yo male with alerted mental status and hyperglycemia with possible seizure at home.  Had elevated lactic acid, fever (Tm 101.8) on admission.  He has hx of DM on insulin, Bipolar, chronic pancreatitis, ETOH and withdrawal seizures.  LP negative for infection.  ASSESSMENT/PLAN:  Acute encephalopathy with concern for ETOH withdrawal. Possible ETOH withdrawal seizure prior to admission. Plan: - thiamine, folic acid - prn ativan for CIWA > 8, seizure - prn haldol - Observe off precedex.  Uncontrolled DM. Plan: - per primary team  Fever, elevated lactic acid on admission >> More likely from reported seizure as outpt rather than sepsis.  No obvious source of infection, and procalcitonin  negative. Plan: - Off rocephin 11/13  Protein-calorie malnutrition. Plan: - Nutrition consult. Start PO diet if mental status continues to improve. - continue IV fluid D5 1/2 NS with 20 meq KCL at 50 ml/hr  Hx of Bipolar disease. Plan: - per primary team  Hypokalemia. Hypomagnesemia. Plan: - Replace electrolytes.  Going to get transferred to tele today. Femoral line needs to come out after getting PIVs We will sign off. Please reconsult [place] if  needed.  Marshell Garfinkel MD [facility] Pager [phone] If no answer or after 3pm call: [phone] 12/08/2014, 9:55 AM

## 2014-12-08 NOTE — Progress Notes (Signed)
PHARMACIST - PHYSICIAN COMMUNICATION Key Points: Use following P&T approved IV to PO antibiotic change policy. Description contains the criteria that are approved Note: Policy Excludes:  Esophagectomy patients   DR: [facility] CONCERNING: IV to Oral Route Change Policy  RECOMMENDATION: This patient is receiving thiamine/folate by the intravenous route.  Based on criteria approved by the Pharmacy and Therapeutics Committee, the intravenous medication(s) is/are being converted to the equivalent oral dose form(s).   DESCRIPTION: These criteria include:  The patient is eating (either orally or via tube) and/or has been taking other orally administered medications for a least 24 hours  The patient has no evidence of active gastrointestinal bleeding or impaired GI absorption (gastrectomy, short bowel, patient on TNA or NPO).  If you have questions about this conversion, please contact the Pharmacy Department  []   [phone] )  [facility] []   [phone] )  [facility] []   [phone] )  [facility] []   [phone] )  [facility] [x]   [phone] )  [place], [place], [facility] 12/08/2014 12:37 PM

## 2014-12-08 NOTE — Progress Notes (Signed)
Patient ID: Michael [name], male   DOB: 08-24-1955, 59 y.o.   MRN: [id number] [facility] PROGRESS NOTE  Michael [name] VQX:[id number] DOB: 1955-12-27 DOA: 12/03/2014 PCP: Juluis Mire, MD  Brief narrative:    59 y.o. male, with past medical history significant for uncontrolled insulin dependent diabetes mellitus , bipolar disorder, chronic pancreatitis, previous history of drug abuse (THC, opiates), alcohol abuse. Patient was found unresponsive by EMS. Apparently he lives with his sister and a niece. They reported that his last alcoholic beverage was about a month ago.   On admission, the thought was that patient possibly has viral or bacterial meningitis considering fever, tachycardia, tachypnea, leukocytosis. Lumbar puncture performed, CSF analysis not remarkable for acute infection. Chest x-ray and urinalysis were unremarkable. CT head showed no acute intracranial findings. Alcohol level is within normal limits. UDS was unremarkable.  Blood work was notable for white blood cell count of 11.4, lactic acid of 3.31, potassium of 2, sodium 147, normal creatinine. Additionally, CBG was more than 600, glucose on BMP was more than 700. Ketones were not present in urine. He was started on insulin drip for hyperosmolar hyperglycemia.  Since the admission, patient was very restless even though he was on CIWA protocol. Critical care has seen him in consultation. He was subsequently started on precedex drip 12/05/2014.  Most recently hospital course complicated with the following 1. Hypothermia on 12/06/2014 for which he required bear hugger. He then developed a fever 11/14 of 100.51F so he is no longer on bear hugger. 2. Hypotension 11/13 which is likely due to precedex and we started to wean precedex in addition to giving IV fluids for pressure support.  This morning, patient is hemodynamically stable. He is finally off of precedex and his BP is 161/48. His mental status is significantly better  this am. Stable for transfer to telemetry.   Assessment/Plan:    Principal problem: Acute encephalopathy / history of alcohol abuse / possible acute alcohol withdrawal with delirium - It has been somewhat of a mystery why patient's mental status has not changed since admission. - Most certainly it looked like withdrawal delirium from alcohol however patient's family reported last alcoholic beverage about a month ago. - Urine drug screen and alcohol level were unremarkable on this admission.  - He was also placed on Keppra as we thought he may have had seizures. EEG done 11/11 and showed no epileptiform activity and Keppra subsequently stopped 11/12. - As noted above, pt started on CIWA on the admission but because of restiveness, combativeness we had to start precedex drip 12/05/2014. - We finally weaned off precedex yesterday 12/07/2014. - Appreciate very mch PCCM help with management of acute delirium - Of note, on admission, the thought was that the patient may also have had acute bacterial or viral meningitis. Lumbar puncture was performed at the time of the admission. CSF is unremarkable. The antibiotics were started on the admission, acyclovir, vancomycin, Rocephin. We stopped acyclovir and vancomycin 12/04/2014. We continued Rocephin  to treat for possible pneumonia through 12/06/2014). - Much better mental status this am  - Transfer to telemetry today   Active Problems: Sepsis due to unspecified organism / leukocytosis / lactic acidosis / Hypothermia - Sepsis criteria met on the admission with fever of 101.44F, tachycardia, tachypnea, hypotension, lactic acidosis - No clear source of infection identified. - Initially thought pt has acute bacterial or viral meningitis. He was started on acyclovir, vanco and rocephin.  A lumbar puncture done on the admission and  CSF analysis unremarkable. - Acyclovir and vancomycin were stopped 12/04/2014. Rocephin stopped 12/06/2014. - Patient was  hypothermic 11/13 and we collected urinalysis, lactic acid - all WNL. CXR did not show acute infectious process. - Appreciate PCCM recommendations, so far holding off on any abx.  - Fever overnight of 100.6 is likely from bear hugger which he has required the day before for hypothermia.  Uncontrolled diabetes mellitus with hyperosmolarity without coma, with long-term current use of insulin ([place]) - On admission, CBG of more than 600, glucose on BMP more than 700. Ketones were not present in urine. AG was WNL. - Started on insulin drip on admission - A1c in 10/2014 was 11.8 indicating poor glycemic control. - Transitioned to subQ insulin 12/04/2014. - CBG's in past 24 hours: 60, 73, 193 - We will continue sliding scale insulin  Hypernatremia - Secondary to dehydration - Sodium WNL  Hypokalemia - From insulin.  - Supplemented through IV fluids   Hypomagnesemia - Due to poor nutritional status - Supplemented   Severe protein calorie malnutrition - Likely due to acute on chronic illness  - Nutrition consulted  - He is not eating due to altered mental status   DVT Prophylaxis  - Heparin subQ ordered   Code Status: Full.  Family Communication:  family not at the bedside this morning  Disposition Plan: transfer to telemetry today   IV access:  Peripheral IV  Procedures and diagnostic studies:     Dg Chest Port 1 View 12/07/2014   1. No acute cardiopulmonary disease. 2. Vascular congestion without overt pulmonary edema. No evidence of pneumonia.   Dg Chest 1 View 12/03/2014  Stable age advanced cerebral atrophy, ventriculomegaly and periventricular white matter disease. No acute intracranial findings or mass lesions. Electronically Signed   By: Marijo Sanes M.D.   On: 12/03/2014 19:37   Ct Head Wo Contrast 12/03/2014   Stable age advanced cerebral atrophy, ventriculomegaly and periventricular white matter disease. No acute intracranial findings or mass lesions. Electronically  Signed   By: Marijo Sanes M.D.   On: 12/03/2014 19:37   Dg Chest Port 1 View 12/04/2014   Newly coarsened interstitial markings is likely from lower volumes, but cannot exclude developing volume overload/edema. Electronically Signed   By: Monte Fantasia M.D.   On: 12/04/2014 05:53    Medical Consultants:  PCCM  Other Consultants:  None   IAnti-Infectives:   Acyclovir, rocephin, vancomycin, ampicillin. Started on admission and stopped 11/1 except for rocephin. Rocephin 12/03/2014 -->  12/06/2014   Leisa Lenz, MD  [facility] Pager [phone]  Time spent in minutes: 25 minutes  If 7PM-7AM, please contact night-coverage [email or web address] Password [email or web address] 12/08/2014, 6:00 AM   LOS: 5 days    HPI/Subjective: No overnight events. No respiratory distress.  Objective: Filed Vitals:   12/08/14 0200 12/08/14 0300 12/08/14 0400 12/08/14 0500  BP: 178/114 161/48    Pulse: 105 103 109 110  Temp: 99.5 F (37.5 C) 99.5 F (37.5 C) 98.8 F (37.1 C) 99.3 F (37.4 C)  TempSrc:      Resp: 22 15 20 16   Height:      Weight:      SpO2: 97% 100% 98% 100%    Intake/Output Summary (Last 24 hours) at 12/08/14 0600 Last data filed at 12/08/14 0500  Gross per 24 hour  Intake 3419.16 ml  Output    685 ml  Net 2734.16 ml    Exam:   General:  Pt not in distress, alert, awake  Cardiovascular:  RRR, appreciate S1, S2   Respiratory: bilateral air entry, no wheezing   Abdomen: (+) BS, non tender   Extremities: No edema, palpable pulses   Neuro: nonfocal  Data Reviewed: Basic Metabolic Panel:  Recent Labs Lab 12/03/14 2350  12/05/14 0530 12/05/14 0800 12/06/14 0532 12/07/14 0440 12/08/14 0445  NA 147*  < > 141 144 144 141 137  K 2.0*  < > 2.6* 3.0* 3.3* 3.0* 3.1*  CL 112*  < > 108 112* 111 103 104  CO2 26  < > 27 26 25 29 25   GLUCOSE 334*  < > 236* 251* 169* 235* 288*  BUN <5*  < > <5* <5* <5* <5* <5*  CREATININE 0.69  < > 0.62 0.67 0.57* 0.58* <0.30*   CALCIUM 8.8*  < > 8.1* 8.3* 8.2* 8.2* 8.3*  MG 1.9  --  1.7  --  1.4* 1.5* 1.4*  PHOS 1.1*  --  3.3  --  3.2  --   --   < > = values in this interval not displayed. Liver Function Tests:  Recent Labs Lab 12/03/14 1858 12/04/14 0500 12/06/14 0532  AST 33 17 19  ALT 36 23 20  ALKPHOS 205* 117 132*  BILITOT 0.5 0.8 0.8  PROT 7.3 5.3* 5.0*  ALBUMIN 4.1 2.8* 2.6*    Recent Labs Lab 12/03/14 1858  LIPASE 22   No results for input(s): AMMONIA in the last 168 hours. CBC:  Recent Labs Lab 12/03/14 1858 12/03/14 1923 12/04/14 0500 12/06/14 0532 12/08/14 0445  WBC 11.4*  --  15.3* 11.0* 8.6  NEUTROABS 10.3*  --   --   --   --   HGB 17.6* 19.4* 13.4 14.5 13.2  HCT 49.0 57.0* 36.2* 40.0 37.2*  MCV 92.3  --  89.8 90.3 90.7  PLT 211  --  184 165 158   Cardiac Enzymes:  Recent Labs Lab 12/03/14 1858  TROPONINI <0.03   BNP: Invalid input(s): POCBNP CBG:  Recent Labs Lab 12/07/14 0809 12/07/14 1214 12/07/14 1737 12/07/14 1739 12/07/14 2136  GLUCAP 234* 256* 60* 73 193*    Results for orders placed or performed during the hospital encounter of 12/03/14  Urine culture     Status: None   Collection Time: 12/03/14  6:30 PM  Result Value Ref Range Status   Specimen Description URINE, RANDOM  Final   Special Requests NONE  Final   Culture   Final    NO GROWTH 1 DAY Performed at [facility]    Report Status 12/04/2014 FINAL  Final  Blood Culture (routine x 2)     Status: None (Preliminary result)   Collection Time: 12/03/14  6:59 PM  Result Value Ref Range Status   Specimen Description BLOOD RIGHT ANTECUBITAL  Final   Special Requests BOTTLES DRAWN AEROBIC AND ANAEROBIC 5 ML  Final   Culture   Final    NO GROWTH 4 DAYS Performed at [facility]    Report Status PENDING  Incomplete  Blood Culture (routine x 2)     Status: None (Preliminary result)   Collection Time: 12/03/14  7:05 PM  Result Value Ref Range Status   Specimen Description  BLOOD LEFT WRIST  Final   Special Requests BOTTLES DRAWN AEROBIC AND ANAEROBIC 5 ML  Final   Culture   Final    NO GROWTH 4 DAYS Performed at [facility]    Report Status PENDING  Incomplete  CSF culture  Status: None   Collection Time: 12/03/14  9:39 PM  Result Value Ref Range Status   Specimen Description CSF  Final   Special Requests NONE  Final   Gram Stain   Final    CYTOSPIN WBC PRESENT, PREDOMINANTLY PMN NO ORGANISMS SEEN Gram Stain Report Called to,Read Back By and Verified With: A CHAVIS RN [id number] 12/03/14 A NAVARRO    Culture   Final    NO GROWTH 3 DAYS Performed at [facility]    Report Status 12/07/2014 FINAL  Final  MRSA PCR Screening     Status: Abnormal   Collection Time: 12/03/14 10:46 PM  Result Value Ref Range Status   MRSA by PCR POSITIVE (A) NEGATIVE Final     . antiseptic oral rinse  7 mL Mouth Rinse BID  . folic acid  1 mg Intravenous Daily  . heparin subcutaneous  5,000 Units Subcutaneous 3 times per day  . insulin aspart  0-15 Units Subcutaneous TID WC  . insulin aspart  0-5 Units Subcutaneous QHS  . multivitamin with minerals  1 tablet Oral Daily  . mupirocin ointment  1 application Nasal BID  . sodium chloride  3 mL Intravenous Q12H  . thiamine  100 mg Intravenous Daily     Continuous Infusions: . dexmedetomidine Stopped (12/07/14 1609)  . dextrose 5 % and 0.45 % NaCl with KCl 20 mEq/L 50 mL/hr at 12/08/14 0508

## 2014-12-09 DIAGNOSIS — E43 Unspecified severe protein-calorie malnutrition: Secondary | ICD-10-CM

## 2014-12-09 DIAGNOSIS — E0900 Drug or chemical induced diabetes mellitus with hyperosmolarity without nonketotic hyperglycemic-hyperosmolar coma (NKHHC): Secondary | ICD-10-CM

## 2014-12-09 DIAGNOSIS — E876 Hypokalemia: Secondary | ICD-10-CM

## 2014-12-09 DIAGNOSIS — Z794 Long term (current) use of insulin: Secondary | ICD-10-CM

## 2014-12-09 DIAGNOSIS — E870 Hyperosmolality and hypernatremia: Secondary | ICD-10-CM

## 2014-12-09 DIAGNOSIS — T68XXXA Hypothermia, initial encounter: Secondary | ICD-10-CM

## 2014-12-09 LAB — CBC
HEMATOCRIT: 38.3 % — AB (ref 39.0–52.0)
HEMOGLOBIN: 13.6 g/dL (ref 13.0–17.0)
MCH: 32 pg (ref 26.0–34.0)
MCHC: 35.5 g/dL (ref 30.0–36.0)
MCV: 90.1 fL (ref 78.0–100.0)
Platelets: 153 10*3/uL (ref 150–400)
RBC: 4.25 MIL/uL (ref 4.22–5.81)
RDW: 12.7 % (ref 11.5–15.5)
WBC: 11.9 10*3/uL — ABNORMAL HIGH (ref 4.0–10.5)

## 2014-12-09 LAB — GLUCOSE, CAPILLARY
GLUCOSE-CAPILLARY: 231 mg/dL — AB (ref 65–99)
GLUCOSE-CAPILLARY: 168 mg/dL — AB (ref 65–99)
GLUCOSE-CAPILLARY: 85 mg/dL (ref 65–99)
Glucose-Capillary: 215 mg/dL — ABNORMAL HIGH (ref 65–99)
Glucose-Capillary: 207 mg/dL — ABNORMAL HIGH (ref 65–99)

## 2014-12-09 LAB — BASIC METABOLIC PANEL
ANION GAP: 8 (ref 5–15)
CHLORIDE: 106 mmol/L (ref 101–111)
CO2: 25 mmol/L (ref 22–32)
Calcium: 8.6 mg/dL — ABNORMAL LOW (ref 8.9–10.3)
Creatinine, Ser: 0.43 mg/dL — ABNORMAL LOW (ref 0.61–1.24)
GLUCOSE: 238 mg/dL — AB (ref 65–99)
POTASSIUM: 3.6 mmol/L (ref 3.5–5.1)
Sodium: 139 mmol/L (ref 135–145)

## 2014-12-09 LAB — MAGNESIUM: Magnesium: 1.7 mg/dL (ref 1.7–2.4)

## 2014-12-09 MED ORDER — POTASSIUM CHLORIDE CRYS ER 20 MEQ PO TBCR
40.0000 meq | EXTENDED_RELEASE_TABLET | Freq: Two times a day (BID) | ORAL | Status: AC
  Administered 2014-12-09 (×2): 40 meq via ORAL
  Filled 2014-12-09 (×2): qty 2

## 2014-12-09 MED ORDER — GLUCERNA SHAKE PO LIQD
237.0000 mL | Freq: Two times a day (BID) | ORAL | Status: DC
  Administered 2014-12-09 – 2014-12-10 (×3): 237 mL via ORAL
  Filled 2014-12-09 (×4): qty 237

## 2014-12-09 MED ORDER — INSULIN DETEMIR 100 UNIT/ML ~~LOC~~ SOLN
10.0000 [IU] | Freq: Two times a day (BID) | SUBCUTANEOUS | Status: DC
  Administered 2014-12-09 – 2014-12-10 (×3): 10 [IU] via SUBCUTANEOUS
  Filled 2014-12-09 (×3): qty 0.1

## 2014-12-09 MED ORDER — MAGNESIUM SULFATE 2 GM/50ML IV SOLN
2.0000 g | Freq: Once | INTRAVENOUS | Status: AC
  Administered 2014-12-09: 2 g via INTRAVENOUS
  Filled 2014-12-09: qty 50

## 2014-12-09 NOTE — Progress Notes (Signed)
[facility] PROGRESS NOTE    Progress Note   Michael [name]:[id number] DOB: 1955-04-20 DOA: 12/03/2014 PCP: Juluis Mire, MD   Brief Narrative:   Michael [name] is an 59 y.o. male past medical history of uncontrolled diabetes mellitus insulin-dependent, bipolar disorder, chronic pancreatitis, drug abuse was found unresponsive by EMS, they reported that her last alcoholic beverage was a month ago on admission the patient had fever and tachycardia tachypnea with leukocytosis, appendectomy was performed that was unremarkable, chest x-ray and UA were also unremarkable, CT of the head showed no acute intracranial findings, [place] a relatively normal limits: UDS was negative blood work showed white count of 11 potassium of 3.3 no leukocytosis her blood glucose was 600. Since admission the patient was restless was started on Prcedex protocol development mild hypothermia and hypotension 12/06/2014 start her on low-dose Precedex has been weaned off and transferred to the floor.  Assessment/Plan:   Acute encephalopathy/Alcohol withdrawal ([place]): - Clinically looks like more of withdrawals from alcohol/delirium and probable hyperglycemia - UDS and alcohol were unremarkable, patient was placed on Keppra as a follow she might of had a seizure EEG was done that showed concerning activity so these were stopped. - Patient was started on prcedex which she did not responded PCCM was consulted, Precedex was weaned and 11 2014 2016 that the patient developed acute delirium.  SIRS due to unspecified organism / leukocytosis / lactic acidosis / Hypothermia: Sepsis criteria met on admission with fever #1, she was treated empirically with antibiotics for possible meningitis this was D escalated to Rocephin. No clear source of infection has been identified. Patient was hypothermic on admission UA lactic acid did note show any signs of infection. PCCM recommended to hold antibiotics. She'll remain  afebrile.  Uncontrolled diabetes mellitus with hyperosmolarity without coma with long-term use of insulin: CBG was greater than 600, and Stable. She was started on insulin drip hemoglobin A1c was 11.8. Continue sliding scale insulin, start Levemir. He relates he has been noncompliant with his Levemir.  Hypernatremia: Due to chronic seizures M Levey fluid hydration.  Hypokalemia/hypomagnesemia: This was supplemented likely prerenal etiology.  Protein-calorie malnutrition, severe Likely due to ETOH abuse continuing treatment early.    DVT Prophylaxis - Lovenox ordered.  Family Communication: none Disposition Plan: Home when stable. Code Status:     Code Status Orders        Start     Ordered   12/03/14 2244  Full code   Continuous     12/03/14 2243        IV Access:    Peripheral IV   Procedures and diagnostic studies:   No results found.   Medical Consultants:    None.  Anti-Infectives:   Anti-infectives    Start     Dose/Rate Route Frequency Ordered Stop   12/05/14 1000  cefTRIAXone (ROCEPHIN) 1 g in dextrose 5 % 50 mL IVPB  Status:  Discontinued     1 g 100 mL/hr over 30 Minutes Intravenous Every 24 hours 12/04/14 1058 12/06/14 0834   12/04/14 2000  cefTRIAXone (ROCEPHIN) 1 g in dextrose 5 % 50 mL IVPB  Status:  Discontinued     1 g 100 mL/hr over 30 Minutes Intravenous Every 12 hours 12/04/14 1057 12/04/14 1058   12/04/14 0800  cefTRIAXone (ROCEPHIN) 2 g in dextrose 5 % 50 mL IVPB  Status:  Discontinued     2 g 100 mL/hr over 30 Minutes Intravenous Every 12 hours 12/03/14 1927 12/04/14 1057  12/04/14 0600  vancomycin (VANCOCIN) 500 mg in sodium chloride 0.9 % 100 mL IVPB  Status:  Discontinued     500 mg 100 mL/hr over 60 Minutes Intravenous Every 8 hours 12/03/14 1933 12/04/14 1057   12/04/14 0300  metroNIDAZOLE (FLAGYL) IVPB 500 mg  Status:  Discontinued     500 mg 100 mL/hr over 60 Minutes Intravenous Every 6 hours 12/03/14 1927 12/03/14  1937   12/04/14 0000  ampicillin (OMNIPEN) 2 g in sodium chloride 0.9 % 50 mL IVPB  Status:  Discontinued     2 g 150 mL/hr over 20 Minutes Intravenous 6 times per day 12/03/14 1938 12/04/14 1056   12/03/14 2300  acyclovir (ZOVIRAX) 600 mg in dextrose 5 % 100 mL IVPB  Status:  Discontinued     10 mg/kg  60 kg 112 mL/hr over 60 Minutes Intravenous 3 times per day 12/03/14 2151 12/04/14 1056   12/03/14 1945  ampicillin (OMNIPEN) 2 g in sodium chloride 0.9 % 50 mL IVPB     2 g 150 mL/hr over 20 Minutes Intravenous STAT 12/03/14 1938 12/03/14 2336   12/03/14 1930  vancomycin (VANCOCIN) IVPB 1000 mg/200 mL premix     1,000 mg 200 mL/hr over 60 Minutes Intravenous STAT 12/03/14 1926 12/04/14 1415   12/03/14 1930  cefTRIAXone (ROCEPHIN) 2 g in dextrose 5 % 50 mL IVPB     2 g 100 mL/hr over 30 Minutes Intravenous STAT 12/03/14 1926 12/03/14 2014   12/03/14 1930  metroNIDAZOLE (FLAGYL) IVPB 500 mg  Status:  Discontinued     500 mg 100 mL/hr over 60 Minutes Intravenous STAT 12/03/14 1926 12/03/14 1937      Subjective:    Michael [name] he relates he feels better has been noncompliant with his insulin at home.  Objective:    Filed Vitals:   12/08/14 1600 12/08/14 1645 12/08/14 2223 12/09/14 0622  BP:  133/90 123/82 146/68  Pulse:  125 113 109  Temp:  98.1 F (36.7 C) 98.9 F (37.2 C) 98.6 F (37 C)  TempSrc:   Oral Oral  Resp: 22 22  20   Height:      Weight:      SpO2:  100% 100% 100%    Intake/Output Summary (Last 24 hours) at 12/09/14 1153 Last data filed at 12/09/14 1152  Gross per 24 hour  Intake   1870 ml  Output   1350 ml  Net    520 ml   Filed Weights   12/03/14 2345 12/07/14 0500  Weight: 56.8 kg (125 lb 3.5 oz) 60 kg (132 lb 4.4 oz)    Exam: Gen:  NAD Cardiovascular:  RRR, No M/R/G Chest and lungs:   CTAB Abdomen:  Abdomen soft, NT/ND, + BS Extremities:  No C/E/C   Data Reviewed:    Labs: Basic Metabolic Panel:  Recent Labs Lab 12/03/14 2350   12/05/14 0530 12/05/14 0800 12/06/14 0532 12/07/14 0440 12/08/14 0445 12/09/14 0521  NA 147*  < > 141 144 144 141 137 139  K 2.0*  < > 2.6* 3.0* 3.3* 3.0* 3.1* 3.6  CL 112*  < > 108 112* 111 103 104 106  CO2 26  < > 27 26 25 29 25 25   GLUCOSE 334*  < > 236* 251* 169* 235* 288* 238*  BUN <5*  < > <5* <5* <5* <5* <5* <5*  CREATININE 0.69  < > 0.62 0.67 0.57* 0.58* <0.30* 0.43*  CALCIUM 8.8*  < > 8.1* 8.3* 8.2*  8.2* 8.3* 8.6*  MG 1.9  --  1.7  --  1.4* 1.5* 1.4* 1.7  PHOS 1.1*  --  3.3  --  3.2  --   --   --   < > = values in this interval not displayed. GFR Estimated Creatinine Clearance: 85.3 mL/min (by C-G formula based on Cr of 0.43). Liver Function Tests:  Recent Labs Lab 12/03/14 1858 12/04/14 0500 12/06/14 0532  AST 33 17 19  ALT 36 23 20  ALKPHOS 205* 117 132*  BILITOT 0.5 0.8 0.8  PROT 7.3 5.3* 5.0*  ALBUMIN 4.1 2.8* 2.6*    Recent Labs Lab 12/03/14 1858  LIPASE 22   No results for input(s): AMMONIA in the last 168 hours. Coagulation profile  Recent Labs Lab 12/03/14 1858  INR 1.00    CBC:  Recent Labs Lab 12/03/14 1858 12/03/14 1923 12/04/14 0500 12/06/14 0532 12/08/14 0445 12/09/14 0521  WBC 11.4*  --  15.3* 11.0* 8.6 11.9*  NEUTROABS 10.3*  --   --   --   --   --   HGB 17.6* 19.4* 13.4 14.5 13.2 13.6  HCT 49.0 57.0* 36.2* 40.0 37.2* 38.3*  MCV 92.3  --  89.8 90.3 90.7 90.1  PLT 211  --  184 165 158 153   Cardiac Enzymes:  Recent Labs Lab 12/03/14 1858  TROPONINI <0.03   BNP (last 3 results) No results for input(s): PROBNP in the last 8760 hours. CBG:  Recent Labs Lab 12/08/14 0748 12/08/14 1149 12/08/14 1610 12/08/14 2141 12/09/14 0720  GLUCAP 311* 227* 224* 185* 231*   D-Dimer: No results for input(s): DDIMER in the last 72 hours. Hgb A1c: No results for input(s): HGBA1C in the last 72 hours. Lipid Profile: No results for input(s): CHOL, HDL, LDLCALC, TRIG, CHOLHDL, LDLDIRECT in the last 72 hours. Thyroid function  studies: No results for input(s): TSH, T4TOTAL, T3FREE, THYROIDAB in the last 72 hours.  Invalid input(s): FREET3 Anemia work up: No results for input(s): VITAMINB12, FOLATE, FERRITIN, TIBC, IRON, RETICCTPCT in the last 72 hours. Sepsis Labs:  Recent Labs Lab 12/03/14 2144 12/04/14 0500 12/04/14 0945 12/06/14 0532 12/07/14 0635 12/08/14 0445 12/09/14 0521  PROCALCITON  --   --   --  <0.10  --   --   --   WBC  --  15.3*  --  11.0*  --  8.6 11.9*  LATICACIDVEN 3.31* 1.7 2.5*  --  1.4  --   --    Microbiology Recent Results (from the past 240 hour(s))  Urine culture     Status: None   Collection Time: 12/03/14  6:30 PM  Result Value Ref Range Status   Specimen Description URINE, RANDOM  Final   Special Requests NONE  Final   Culture   Final    NO GROWTH 1 DAY Performed at [facility]    Report Status 12/04/2014 FINAL  Final  Blood Culture (routine x 2)     Status: None   Collection Time: 12/03/14  6:59 PM  Result Value Ref Range Status   Specimen Description BLOOD RIGHT ANTECUBITAL  Final   Special Requests BOTTLES DRAWN AEROBIC AND ANAEROBIC 5 ML  Final   Culture   Final    NO GROWTH 5 DAYS Performed at [facility]    Report Status 12/08/2014 FINAL  Final  Blood Culture (routine x 2)     Status: None   Collection Time: 12/03/14  7:05 PM  Result Value Ref Range  Status   Specimen Description BLOOD LEFT WRIST  Final   Special Requests BOTTLES DRAWN AEROBIC AND ANAEROBIC 5 ML  Final   Culture   Final    NO GROWTH 5 DAYS Performed at [facility]    Report Status 12/08/2014 FINAL  Final  CSF culture     Status: None   Collection Time: 12/03/14  9:39 PM  Result Value Ref Range Status   Specimen Description CSF  Final   Special Requests NONE  Final   Gram Stain   Final    CYTOSPIN WBC PRESENT, PREDOMINANTLY PMN NO ORGANISMS SEEN Gram Stain Report Called to,Read Back By and Verified With: A CHAVIS RN [id number] 12/03/14 A NAVARRO    Culture    Final    NO GROWTH 3 DAYS Performed at [facility]    Report Status 12/07/2014 FINAL  Final  MRSA PCR Screening     Status: Abnormal   Collection Time: 12/03/14 10:46 PM  Result Value Ref Range Status   MRSA by PCR POSITIVE (A) NEGATIVE Final    Comment:        The GeneXpert MRSA Assay (FDA approved for NASAL specimens only), is one component of a comprehensive MRSA colonization surveillance program. It is not intended to diagnose MRSA infection nor to guide or monitor treatment for MRSA infections. RESULT CALLED TO, READ BACK BY AND VERIFIED WITH: REEVES,M/2W @0040  ON 12/04/14 BY KARCZEWSKI,S.      Medications:   . antiseptic oral rinse  7 mL Mouth Rinse BID  . feeding supplement (GLUCERNA SHAKE)  237 mL Oral BID BM  . folic acid  1 mg Oral Daily  . heparin subcutaneous  5,000 Units Subcutaneous 3 times per day  . insulin aspart  0-15 Units Subcutaneous TID WC  . insulin aspart  0-5 Units Subcutaneous QHS  . insulin detemir  10 Units Subcutaneous BID  . magnesium sulfate 1 - 4 g bolus IVPB  2 g Intravenous Once  . multivitamin with minerals  1 tablet Oral Daily  . potassium chloride  40 mEq Oral BID  . sodium chloride  3 mL Intravenous Q12H  . thiamine  100 mg Oral Daily   Continuous Infusions: . dexmedetomidine Stopped (12/07/14 1609)  . dextrose 5 % and 0.45 % NaCl with KCl 20 mEq/L 50 mL/hr at 12/08/14 2356    Time spent: 15 min   LOS: 6 days   [name]  [facility] Pager [phone]  *Please refer to [email or web address], password [email or web address] to get updated schedule on who will round on this patient, as hospitalists switch teams weekly. If 7PM-7AM, please contact night-coverage at [email or web address], password [email or web address] for any overnight needs.  12/09/2014, 11:53 AM

## 2014-12-09 NOTE — Progress Notes (Signed)
Nutrition Follow-up  DOCUMENTATION CODES:   Severe malnutrition in context of chronic illness  INTERVENTION:  - Continue Carb Modified diet  - Will order Glucerna Shake BID, each supplement provides 220 kcal and 10 grams of protein - RD will continue to monitor for needs  NUTRITION DIAGNOSIS:   Malnutrition related to chronic illness (alcoholism, uncontrolled diabetes.) as evidenced by severe depletion of muscle mass, severe depletion of body fat. -ongoing  GOAL:   Patient will meet greater than or equal to 90% of their needs -variably met  MONITOR:   PO intake, I & O's, Labs, Supplement acceptance  ASSESSMENT:   Michael [name] is a 59 y.o. male, with past medical history significant for uncontrolled insulin dependent diabetes mellitus , bipolar disorder, and chronic pancreatitis brought by EMS after he was found unresponsive. He was seen last a week early in the morning today. He lives with his sister and niece and they found him today unresponsive at home. In the emergency room the patient was noted to be febrile with elevated lactic acid level. His blood sugar was elevated and his anion gap was not significantly elevated. LP was done and is still pending and the patient was started on broad-spectrum antibiotics in addition to acyclovir to cover for viral meningitis.  11/16 Per chart review, pt ate 25% breakfast 11/13 and 100% breakfast and 50% dinner yesterday (11/15). Charge RN reports that pt has been agitated this AM and was recently calmed down and able to be settled. Visualized breakfast tray in room with what appeared to be 75-100% completion.  Will order Glucerna Shake BID to supplement. Pt likely variably meeting needs. Medications reviewed. Labs reviewed; CBGs: 60-311 mg/dL, BUN <5,creatinine, Ca: 8.6 mg/dL.    11/14 - Pt exhibits AMS, but could answer some questions. - Admits to so-so appetite, fluctuates with how he feels, likely pertaining to uncontrolled diabetes.   - Admits he was eating well PTA, but was not utilizing insulin appropriately. Likely resulted in possible DKA. - Pt reports Approximately 40# wt loss, with UBW around 210#  - Per chart review, pt has severe wt loss of 41#/26% of in 6 months.  - Per chart review, pt met with CDE in previous admission to help control DM.  - Does not appear further education would be needed, as compliance appears to be poor. - Nutrition-Focused physical exam completed. Findings are severe fat depletion, severe muscle depletion, and no edema.     Diet Order:  Diet Carb Modified Fluid consistency:: Thin; Room service appropriate?: Yes  Skin:  Reviewed, no issues  Last BM:  PTA  Height:   Ht Readings from Last 1 Encounters:  12/04/14 _0  (1.626 m)    Weight:   Wt Readings from Last 1 Encounters:  12/07/14 132 lb 4.4 oz (60 kg)    Ideal Body Weight:  59.09 kg (kg)  BMI:  Body mass index is 22.69 kg/(m^2).  Estimated Nutritional Needs:   Kcal:  1800-2000  Protein:  80-95 grams  Fluid:  1.8 - 2L  EDUCATION NEEDS:   No education needs identified at this time     Jarome Matin, RD, LDN Inpatient Clinical Dietitian Pager # [phone] After hours/weekend pager # [phone]

## 2014-12-09 NOTE — Progress Notes (Signed)
$  60 cash taken down to security until pt is discharged. Key to security box placed in pt's chart.

## 2014-12-09 NOTE — Progress Notes (Signed)
Pt refusing to keep tele leads on. Dr. Aileen Fass text paged same.  Lind Guest, RN

## 2014-12-09 NOTE — Progress Notes (Signed)
Inpatient Diabetes Program Recommendations  AACE/ADA: New Consensus Statement on Inpatient Glycemic Control (2015)  Target Ranges:  Prepandial:   less than 140 mg/dL      Peak postprandial:   less than 180 mg/dL (1-2 hours)      Critically ill patients:  140 - 180 mg/dL    Results for RODEL, MAZZOLI (MRN [id number]) as of 12/09/2014 10:17  Ref. Range 12/08/2014 07:48 12/08/2014 11:49 12/08/2014 16:10 12/08/2014 21:41  Glucose-Capillary Latest Ref Range: 65-99 mg/dL 311 (H) 227 (H) 224 (H) 185 (H)    Results for KAZMIR, IMBRIANO (MRN [id number]) as of 12/09/2014 10:17  Ref. Range 12/09/2014 07:20  Glucose-Capillary Latest Ref Range: 65-99 mg/dL 231 (H)    Home DM Meds: Levemir 15 units QHS       Metformin 1000 mg bid  Current Insulin Orders: Novolog Moderate SSI (0-15 units) TID AC + HS      MD- Please consider starting patient's home dose of Levemir-  Levemir 15 units QHS      --Will follow patient during hospitalization--  Wyn Quaker RN, MSN, CDE Diabetes Coordinator Inpatient Glycemic Control Team Team Pager: [phone] (8a-5p)

## 2014-12-10 DIAGNOSIS — D72829 Elevated white blood cell count, unspecified: Secondary | ICD-10-CM

## 2014-12-10 DIAGNOSIS — F10230 Alcohol dependence with withdrawal, uncomplicated: Secondary | ICD-10-CM

## 2014-12-10 DIAGNOSIS — E872 Acidosis: Secondary | ICD-10-CM

## 2014-12-10 DIAGNOSIS — E0800 Diabetes mellitus due to underlying condition with hyperosmolarity without nonketotic hyperglycemic-hyperosmolar coma (NKHHC): Secondary | ICD-10-CM

## 2014-12-10 LAB — GLUCOSE, CAPILLARY
GLUCOSE-CAPILLARY: 77 mg/dL (ref 65–99)
GLUCOSE-CAPILLARY: 141 mg/dL — AB (ref 65–99)

## 2014-12-10 LAB — CREATININE, SERUM
Creatinine, Ser: 0.39 mg/dL — ABNORMAL LOW (ref 0.61–1.24)
GFR calc Af Amer: >60 mL/min (ref >60)
GFR calc non Af Amer: >60 mL/min (ref >60)

## 2014-12-10 LAB — MAGNESIUM: Magnesium: 1.7 mg/dL (ref 1.7–2.4)

## 2014-12-10 NOTE — Discharge Instructions (Signed)
Michael [name] was admitted to the Hospital on 12/03/2014 and Discharged on Discharge Date 12/10/2014 and should be excused from work/school.  for 8  days starting 12/03/2014 , may return to work/school without any restrictions.  Call [name] MD, [place] [facility] [phone] with questions.  [name] M.D on 12/10/2014,at 10:46 AM  [facility] Office  [phone]

## 2014-12-10 NOTE — Discharge Summary (Addendum)
Physician Discharge Summary  DERRAL COLUCCI MBW:[id number] DOB: September 24, 1955 DOA: 12/03/2014  PCP: Juluis Mire, MD  Admit date: 12/03/2014 Discharge date: 12/10/2014  Time spent:35 minutes  Recommendations for Outpatient Follow-up:  1. Follow-up with his primary care doctor in 2-4 weeks.   Discharge Diagnoses:  Principal Problem:   Acute encephalopathy Active Problems:   Alcohol withdrawal (HCC)   Protein-calorie malnutrition, severe   Uncontrolled diabetes mellitus with hyperosmolarity without coma, with long-term current use of insulin (HCC)   Sepsis (HCC)   Lactic acidosis   Leukocytosis   Hypernatremia   Hypokalemia   History of alcohol abuse   Hypothermia   Hypomagnesemia   Discharge Condition: stable  Diet recommendation: regular  Filed Weights   12/03/14 2345 12/07/14 0500  Weight: 56.8 kg (125 lb 3.5 oz) 60 kg (132 lb 4.4 oz)    History of present illness:  59 year old 30 pound weight loss and nonspecific symptoms but a due to discontinuation of his insulin  by by EMS as he was found to be unresponsive. He is brought in by EMS as his family called them as they found him unresponsive at home.   Hospital Course:  Acute encephalopathy: Multifactorial likely due to alcohol withdrawal and extreme hyperglycemia. He has was done and alcohol which was unremarkable. Patient was placed on Keppra due to concern for seizures, an EEG was done that showed no elliptical form days. PCCM was consulted and he had to be put on Precedex he weaned off Precedex and transferred to the regular floor. He remained stable.  SIRS due to hyperglycemia/leukocytosis/lactic acidosis/hypothermia: No specific site of infection was found he was started empirically on vancomycin and Zosyn. Cultures remain negative so antibiotics were DC'd. He remained afebrile with no leukocytosis to aggressive his hospital stay. Surgeon was likely due to extreme hyperglycemia.  Uncontrolled diabetes  mellitus with hyperosmolar without coma with long-term insulin use: He was started on the insulin protocol his hemoglobin came down nicely. He was switched to his home dose of Levemir 15 units and metformin. His A1c was 11.8. This is likely due to noncompliance from his medication no changes were made to his regimen. Follow up with PCP in 2-4 weeks.   Hyponatremia: Likely due to elevated blood glucose leading to hypovolemia.  Hypokalemia/hypomagnesemia: These were repleted and remain stable.  Severe protein caloric malnutrition.   Procedures:  EEG  CXR  Consultations:  PCCM  Discharge Exam: Filed Vitals:   12/10/14 0314  BP: 142/93  Pulse: 99  Temp: 98.3 F (36.8 C)  Resp: 20    General: A&O x3 Cardiovascular: RRR Respiratory: good air movement CTA B/L  Discharge Instructions   Discharge Instructions    Diet - low sodium heart healthy    Complete by:  As directed      Increase activity slowly    Complete by:  As directed           Current Discharge Medication List    CONTINUE these medications which have NOT CHANGED   Details  acetaminophen (TYLENOL) 500 MG tablet Take 1 tablet (500 mg total) by mouth every 6 (six) hours as needed. Qty: 100 tablet, Refills: 2   Associated Diagnoses: Chronic alcoholic pancreatitis (HCC)    aspirin EC 81 MG tablet Take 81 mg by mouth at bedtime.    FLUoxetine (PROZAC) 20 MG capsule Take 20 mg by mouth 2 (two) times daily.     folic acid (FOLVITE) 1 MG tablet TK 1 T PO D Refills: 3  gabapentin (NEURONTIN) 300 MG capsule Take 1 capsule (300 mg total) by mouth 3 (three) times daily. Qty: 90 capsule, Refills: 0   Associated Diagnoses: Chronic alcoholic pancreatitis (HCC); Type 2 diabetes, controlled, with neuropathy (HCC)    Insulin Detemir (LEVEMIR FLEXTOUCH) 100 UNIT/ML Pen Inject 15 units daily before bedtime Qty: 15 mL, Refills: 3   Associated Diagnoses: Diabetes mellitus due to underlying condition with  ketoacidosis without coma, with long-term current use of insulin (HCC)    lipase/protease/amylase (CREON) 36000 UNITS CPEP capsule Take 1 capsule (36,000 Units total) by mouth 3 (three) times daily. Qty: 90 capsule, Refills: 0    lovastatin (MEVACOR) 40 MG tablet Take 1 tablet (40 mg total) by mouth at bedtime. Qty: 30 tablet, Refills: 2   Associated Diagnoses: Dyslipidemia    magnesium oxide (MAG-OX) 400 (241.3 MG) MG tablet Take 1 tablet (400 mg total) by mouth daily. Qty: 30 tablet, Refills: 3    metFORMIN (GLUCOPHAGE) 500 MG tablet TK 2 TS PO BID WITH A MEAL Refills: 3    Multiple Vitamin (MULTIVITAMIN WITH MINERALS) TABS tablet Take 1 tablet by mouth daily.    Vitamin D, Ergocalciferol, (DRISDOL) 50000 UNITS CAPS capsule Take 1 capsule (50,000 Units total) by mouth every 7 (seven) days. Qty: 7 capsule, Refills: 0    Blood Glucose Monitoring Suppl (ACCU-CHEK AVIVA PLUS) W/DEVICE KIT Check blood sugar up to 3 times a day Qty: 1 kit, Refills: 1   Associated Diagnoses: Diabetes mellitus due to underlying condition with ketoacidosis without coma, with long-term current use of insulin (HCC)    feeding supplement, GLUCERNA SHAKE, (GLUCERNA SHAKE) LIQD Take 237 mLs by mouth 3 (three) times daily with meals. Refills: 0    glucose blood (ACCU-CHEK AVIVA PLUS) test strip Check blood sugar up to 3 times a day Qty: 100 each, Refills: 12   Associated Diagnoses: Diabetes mellitus due to underlying condition with ketoacidosis without coma, with long-term current use of insulin (HCC)    Insulin Pen Needle 31G X 5 MM MISC Use to inject 15 units of Levemir one time a day Qty: 100 each, Refills: 5   Associated Diagnoses: Diabetes mellitus due to underlying condition with ketoacidosis without coma, with long-term current use of insulin (HCC)    Insulin Syringe-Needle U-100 (B-D INSULIN SYRINGE) 31G X 5/16" 0.3 ML MISC Use to inject insulin into the skin at bedtime each day. diag code E11.40.  Insulin dependent Qty: 90 each, Refills: 0   Associated Diagnoses: Uncontrolled type 2 diabetes mellitus with ketoacidosis without coma, with long-term current use of insulin (HCC)    ondansetron (ZOFRAN) 4 MG tablet Take 1 tablet (4 mg total) by mouth every 6 (six) hours. Qty: 6 tablet, Refills: 5    pantoprazole (PROTONIX) 40 MG tablet Take 1 tablet (40 mg total) by mouth daily. Qty: 90 tablet, Refills: 0   Associated Diagnoses: Medication refill      STOP taking these medications     ACCU-CHEK SOFTCLIX LANCETS lancets      ibuprofen (ADVIL,MOTRIN) 800 MG tablet        Allergies  Allergen Reactions  . Ace Inhibitors Swelling    Angioedema - unquestionable  . Losartan Swelling    Pt presented with unquestionable angioedema on ACEI, so aviod ARBs as well      The results of significant diagnostics from this hospitalization (including imaging, microbiology, ancillary and laboratory) are listed below for reference.    Significant Diagnostic Studies: Dg Chest 1 View  12/03/2014  CLINICAL   DATA:  Altered mental status tonight. EXAM: CT HEAD WITHOUT CONTRAST TECHNIQUE: Contiguous axial images were obtained from the base of the skull through the vertex without intravenous contrast. COMPARISON:  10/09/2014 FINDINGS: Stable age advanced cerebral atrophy, ventriculomegaly and periventricular white matter disease. No extra-axial fluid collections are identified. No CT findings for acute hemispheric infarction or intracranial hemorrhage. No mass lesions. The brainstem and cerebellum are normal. No acute bony findings. Stable left maxillary sinus wall thickening. The globes are intact. IMPRESSION: Stable age advanced cerebral atrophy, ventriculomegaly and periventricular white matter disease. No acute intracranial findings or mass lesions. Electronically Signed   By: Marijo Sanes M.D.   On: 12/03/2014 19:37   Ct Head Wo Contrast  12/03/2014  CLINICAL DATA:  Altered mental status tonight.  EXAM: CT HEAD WITHOUT CONTRAST TECHNIQUE: Contiguous axial images were obtained from the base of the skull through the vertex without intravenous contrast. COMPARISON:  10/09/2014 FINDINGS: Stable age advanced cerebral atrophy, ventriculomegaly and periventricular white matter disease. No extra-axial fluid collections are identified. No CT findings for acute hemispheric infarction or intracranial hemorrhage. No mass lesions. The brainstem and cerebellum are normal. No acute bony findings. Stable left maxillary sinus wall thickening. The globes are intact. IMPRESSION: Stable age advanced cerebral atrophy, ventriculomegaly and periventricular white matter disease. No acute intracranial findings or mass lesions. Electronically Signed   By: Marijo Sanes M.D.   On: 12/03/2014 19:37   Mr 3d Recon At Scanner  11/18/2014  CLINICAL DATA:  59 year old male with history of chronic pancreatitis, subsequent encounter. Worsening abdominal pain for the last 4 5 days localizing the epigastric and right upper quadrant region and radiating to the bilateral flank area. Vomiting and unable to tolerate p.o. intake. EXAM: MRI ABDOMEN WITHOUT AND WITH CONTRAST (INCLUDING MRCP) TECHNIQUE: Multiplanar multisequence MR imaging of the abdomen was performed both before and after the administration of intravenous contrast. Heavily T2-weighted images of the biliary and pancreatic ducts were obtained, and three-dimensional MRCP images were rendered by post processing. CONTRAST:  51m MULTIHANCE GADOBENATE DIMEGLUMINE 529 MG/ML IV SOLN COMPARISON:  CT scan from 08/12/2014.  MRI from 06/16/2011. FINDINGS: Lower chest:  Unremarkable. Hepatobiliary: Assessment of liver parenchyma degraded by breathing motion. No gross mass lesion evident within the liver. There is mild intrahepatic biliary duct dilatation. Gallbladder is prominently distended and there is are layering tiny stones towards the gallbladder neck. Extrahepatic common duct measures 7  mm in diameter. Pancreas: Pancreas is diffusely atrophic with fullness again noted in the region of the pancreatic head. The extensive pancreatic parenchymal calcifications seen on previous CT is not readily evident on MRI given signal void within the calcium deposits. The main pancreatic duct is diffusely dilated, most prominently in the pancreatic head region right measures up to 7 mm diameter. Clustered tiny cysts are noted in the head of the pancreas and uncinate process. Although fine detail is completely obscured by the patient motion, no gross hypervascular lesion is seen in the pancreas. Heterogeneous perfusion is noted in the region of the pancreatic head and uncinate process which could simply be related to the cystic change and chronic pancreatitis. However, these features could obscure a hypovascular lesion in the pancreatic head. In general, the appearance does not appear substantially changed since 08/12/2014 CT scan. Spleen: No splenomegaly. No focal mass lesion. Adrenals/Urinary Tract: Stable small bilateral adrenal gland adenomas. No hydronephrosis in either kidney. No definite enhancing lesion in either kidney although fine detail obscured by motion artifact. Stomach/Bowel: Stomach is nondistended. No gastric  wall thickening. No evidence of outlet obstruction. Duodenum is normally positioned as is the ligament of Treitz. No evidence for small bowel obstruction within the visualized abdomen. Vascular/Lymphatic: There is no gastrohepatic or hepatoduodenal ligament lymphadenopathy. No intraperitoneal or retroperitoneal lymphadenopy. No abdominal aortic aneurysm. Other: No intraperitoneal free fluid. Musculoskeletal: No abnormal marrow signal within the visualized bony anatomy. IMPRESSION: 1. Study is markedly motion degraded. As seen on multiple prior studies, there is diffuse atrophy of the pancreatic parenchyma with associated diffuse dilatation of the main pancreatic duct. Dystrophic calcification  throughout the pancreas is less well demonstrated by MR than CT. Fullness in the region the pancreatic head is stable in appearance when comparing to CT scan of 08/12/2014. No hypervascular lesion identified in the pancreas. No discrete hypovascular mass lesion is evident although changes in the head of the pancreas and associated motion artifact significantly hinder assessment. CT or MR follow-up could be used to ensure continued stability. 2. Gallbladder distention with cholelithiasis. 3. Stable bilateral adrenal adenomas. Electronically Signed   By: Eric  Mansell M.D.   On: 11/18/2014 08:34   Dg Chest Port 1 View  12/07/2014  CLINICAL DATA:  Patient admitted for altered mental status and hyperglycemia. History of hypertension and diabetes. EXAM: PORTABLE CHEST 1 VIEW COMPARISON:  12/04/2014 FINDINGS: There is bilateral vascular congestion, increased from the previous exam. There is no area of lung consolidation to suggest pneumonia. No pleural effusion or gross pneumothorax on this supine exam. Heart, mediastinum and hila are unremarkable. Bony thorax is intact. IMPRESSION: 1. No acute cardiopulmonary disease. 2. Vascular congestion without overt pulmonary edema. No evidence of pneumonia. Electronically Signed   By: David  Ormond M.D.   On: 12/07/2014 07:56   Dg Chest Port 1 View  12/04/2014  CLINICAL DATA:  Altered mental status. EXAM: PORTABLE CHEST 1 VIEW COMPARISON:  Yesterday FINDINGS: EKG leads and wires create artifact. More coarsened appearance of the perihilar interstitial marking which is likely from lower lung volumes. No overt interstitial edema, effusion, or pneumothorax. Normal heart size and mediastinal contours. IMPRESSION: Newly coarsened interstitial markings is likely from lower volumes, but cannot exclude developing volume overload/edema. Electronically Signed   By: Jonathon  Watts M.D.   On: 12/04/2014 05:53   Mr Abd W/wo Cm/mrcp  11/18/2014  CLINICAL DATA:  59-year-old male with  history of chronic pancreatitis, subsequent encounter. Worsening abdominal pain for the last 4 5 days localizing the epigastric and right upper quadrant region and radiating to the bilateral flank area. Vomiting and unable to tolerate p.o. intake. EXAM: MRI ABDOMEN WITHOUT AND WITH CONTRAST (INCLUDING MRCP) TECHNIQUE: Multiplanar multisequence MR imaging of the abdomen was performed both before and after the administration of intravenous contrast. Heavily T2-weighted images of the biliary and pancreatic ducts were obtained, and three-dimensional MRCP images were rendered by post processing. CONTRAST:  11mL MULTIHANCE GADOBENATE DIMEGLUMINE 529 MG/ML IV SOLN COMPARISON:  CT scan from 08/12/2014.  MRI from 06/16/2011. FINDINGS: Lower chest:  Unremarkable. Hepatobiliary: Assessment of liver parenchyma degraded by breathing motion. No gross mass lesion evident within the liver. There is mild intrahepatic biliary duct dilatation. Gallbladder is prominently distended and there is are layering tiny stones towards the gallbladder neck. Extrahepatic common duct measures 7 mm in diameter. Pancreas: Pancreas is diffusely atrophic with fullness again noted in the region of the pancreatic head. The extensive pancreatic parenchymal calcifications seen on previous CT is not readily evident on MRI given signal void within the calcium deposits. The main pancreatic duct is diffusely dilated, most   prominently in the pancreatic head region right measures up to 7 mm diameter. Clustered tiny cysts are noted in the head of the pancreas and uncinate process. Although fine detail is completely obscured by the patient motion, no gross hypervascular lesion is seen in the pancreas. Heterogeneous perfusion is noted in the region of the pancreatic head and uncinate process which could simply be related to the cystic change and chronic pancreatitis. However, these features could obscure a hypovascular lesion in the pancreatic head. In general,  the appearance does not appear substantially changed since 08/12/2014 CT scan. Spleen: No splenomegaly. No focal mass lesion. Adrenals/Urinary Tract: Stable small bilateral adrenal gland adenomas. No hydronephrosis in either kidney. No definite enhancing lesion in either kidney although fine detail obscured by motion artifact. Stomach/Bowel: Stomach is nondistended. No gastric wall thickening. No evidence of outlet obstruction. Duodenum is normally positioned as is the ligament of Treitz. No evidence for small bowel obstruction within the visualized abdomen. Vascular/Lymphatic: There is no gastrohepatic or hepatoduodenal ligament lymphadenopathy. No intraperitoneal or retroperitoneal lymphadenopy. No abdominal aortic aneurysm. Other: No intraperitoneal free fluid. Musculoskeletal: No abnormal marrow signal within the visualized bony anatomy. IMPRESSION: 1. Study is markedly motion degraded. As seen on multiple prior studies, there is diffuse atrophy of the pancreatic parenchyma with associated diffuse dilatation of the main pancreatic duct. Dystrophic calcification throughout the pancreas is less well demonstrated by MR than CT. Fullness in the region the pancreatic head is stable in appearance when comparing to CT scan of 08/12/2014. No hypervascular lesion identified in the pancreas. No discrete hypovascular mass lesion is evident although changes in the head of the pancreas and associated motion artifact significantly hinder assessment. CT or MR follow-up could be used to ensure continued stability. 2. Gallbladder distention with cholelithiasis. 3. Stable bilateral adrenal adenomas. Electronically Signed   By: Eric  Mansell M.D.   On: 11/18/2014 08:34    Microbiology: Recent Results (from the past 240 hour(s))  Urine culture     Status: None   Collection Time: 12/03/14  6:30 PM  Result Value Ref Range Status   Specimen Description URINE, RANDOM  Final   Special Requests NONE  Final   Culture   Final     NO GROWTH 1 DAY Performed at [facility]    Report Status 12/04/2014 FINAL  Final  Blood Culture (routine x 2)     Status: None   Collection Time: 12/03/14  6:59 PM  Result Value Ref Range Status   Specimen Description BLOOD RIGHT ANTECUBITAL  Final   Special Requests BOTTLES DRAWN AEROBIC AND ANAEROBIC 5 ML  Final   Culture   Final    NO GROWTH 5 DAYS Performed at [facility]    Report Status 12/08/2014 FINAL  Final  Blood Culture (routine x 2)     Status: None   Collection Time: 12/03/14  7:05 PM  Result Value Ref Range Status   Specimen Description BLOOD LEFT WRIST  Final   Special Requests BOTTLES DRAWN AEROBIC AND ANAEROBIC 5 ML  Final   Culture   Final    NO GROWTH 5 DAYS Performed at [facility]    Report Status 12/08/2014 FINAL  Final  CSF culture     Status: None   Collection Time: 12/03/14  9:39 PM  Result Value Ref Range Status   Specimen Description CSF  Final   Special Requests NONE  Final   Gram Stain   Final    CYTOSPIN WBC PRESENT,   PREDOMINANTLY PMN NO ORGANISMS SEEN Gram Stain Report Called to,Read Back By and Verified With: A CHAVIS RN 2255 12/03/14 A NAVARRO    Culture   Final    NO GROWTH 3 DAYS Performed at [facility]    Report Status 12/07/2014 FINAL  Final  MRSA PCR Screening     Status: Abnormal   Collection Time: 12/03/14 10:46 PM  Result Value Ref Range Status   MRSA by PCR POSITIVE (A) NEGATIVE Final    Comment:        The GeneXpert MRSA Assay (FDA approved for NASAL specimens only), is one component of a comprehensive MRSA colonization surveillance program. It is not intended to diagnose MRSA infection nor to guide or monitor treatment for MRSA infections. RESULT CALLED TO, READ BACK BY AND VERIFIED WITH: REEVES,M/2W @0040 ON 12/04/14 BY KARCZEWSKI,S.      Labs: Basic Metabolic Panel:  Recent Labs Lab 12/03/14 2350  12/05/14 0530 12/05/14 0800 12/06/14 0532 12/07/14 0440 12/08/14 0445  12/09/14 0521 12/10/14 0520  NA 147*  < > 141 144 144 141 137 139  --   K 2.0*  < > 2.6* 3.0* 3.3* 3.0* 3.1* 3.6  --   CL 112*  < > 108 112* 111 103 104 106  --   CO2 26  < > 27 26 25 29 25 25  --   GLUCOSE 334*  < > 236* 251* 169* 235* 288* 238*  --   BUN <5*  < > <5* <5* <5* <5* <5* <5*  --   CREATININE 0.69  < > 0.62 0.67 0.57* 0.58* <0.30* 0.43* 0.39*  CALCIUM 8.8*  < > 8.1* 8.3* 8.2* 8.2* 8.3* 8.6*  --   MG 1.9  --  1.7  --  1.4* 1.5* 1.4* 1.7 1.7  PHOS 1.1*  --  3.3  --  3.2  --   --   --   --   < > = values in this interval not displayed. Liver Function Tests:  Recent Labs Lab 12/03/14 1858 12/04/14 0500 12/06/14 0532  AST 33 17 19  ALT 36 23 20  ALKPHOS 205* 117 132*  BILITOT 0.5 0.8 0.8  PROT 7.3 5.3* 5.0*  ALBUMIN 4.1 2.8* 2.6*    Recent Labs Lab 12/03/14 1858  LIPASE 22   No results for input(s): AMMONIA in the last 168 hours. CBC:  Recent Labs Lab 12/03/14 1858 12/03/14 1923 12/04/14 0500 12/06/14 0532 12/08/14 0445 12/09/14 0521  WBC 11.4*  --  15.3* 11.0* 8.6 11.9*  NEUTROABS 10.3*  --   --   --   --   --   HGB 17.6* 19.4* 13.4 14.5 13.2 13.6  HCT 49.0 57.0* 36.2* 40.0 37.2* 38.3*  MCV 92.3  --  89.8 90.3 90.7 90.1  PLT 211  --  184 165 158 153   Cardiac Enzymes:  Recent Labs Lab 12/03/14 1858  TROPONINI <0.03   BNP: BNP (last 3 results)  Recent Labs  12/04/14 0945  BNP 32.8    ProBNP (last 3 results) No results for input(s): PROBNP in the last 8760 hours.  CBG:  Recent Labs Lab 12/09/14 1706 12/09/14 1946 12/09/14 2202 12/10/14 0734 12/10/14 0832  GLUCAP 168* 85 207* 77 141*    Signed:  FELIZ ORTIZ,   [facility] 12/10/2014, 11:00 AM     

## 2014-12-11 ENCOUNTER — Other Ambulatory Visit: Payer: Self-pay

## 2014-12-11 DIAGNOSIS — Z760 Encounter for issue of repeat prescription: Secondary | ICD-10-CM

## 2014-12-11 MED ORDER — PANTOPRAZOLE SODIUM 40 MG PO TBEC: 40.0000 mg | DELAYED_RELEASE_TABLET | Freq: Every day | ORAL | Status: DC

## 2014-12-11 MED ORDER — ASPIRIN EC 81 MG PO TBEC: 81.0000 mg | DELAYED_RELEASE_TABLET | Freq: Every day | ORAL | Status: DC

## 2014-12-11 NOTE — Telephone Encounter (Signed)
Rec'd call from [place] with [facility].  Pt recently discharged and has medications on his AVS that are not found him his home.  Nurse is trying to assist with med adherence by filling med box for patient.  Refill request sent.

## 2014-12-11 NOTE — Telephone Encounter (Signed)
I filled the ASA and the PPI.  Metformin had been D/C'd at his Oct hospitalization thinking he had changed from type II to type I 2/2 pancreatic failure. A pharm tech INAPPROPRIATELY added metformin back on his med list when he was readmitted to the hospitalist team in Nov and the hospitlaist did NOT review his D/C summary and INAPPROPRIATELY continued the med. Therefore, I am not refilling this.  Metformin can be addressed again at his Dec appt with Dr Posey Pronto

## 2014-12-14 ENCOUNTER — Ambulatory Visit (INDEPENDENT_AMBULATORY_CARE_PROVIDER_SITE_OTHER): Payer: Medicaid Other | Admitting: Podiatry

## 2014-12-14 ENCOUNTER — Encounter: Payer: Self-pay | Admitting: Podiatry

## 2014-12-14 VITALS — BP 137/84 | HR 108 | Resp 14 | Ht 64.0 in | Wt 143.0 lb

## 2014-12-14 DIAGNOSIS — M79676 Pain in unspecified toe(s): Secondary | ICD-10-CM | POA: Diagnosis not present

## 2014-12-14 DIAGNOSIS — B351 Tinea unguium: Secondary | ICD-10-CM

## 2014-12-14 DIAGNOSIS — E1149 Type 2 diabetes mellitus with other diabetic neurological complication: Secondary | ICD-10-CM

## 2014-12-14 NOTE — Progress Notes (Signed)
   Subjective:    Patient ID: Michael [name], male    DOB: 1956/01/02, 59 y.o.   MRN: [id number]  HPI   59 year old male presents the office today with concerns of thick, painful, elongated toenails for which she is unable to trim himself. He states the areas. Particular shoe gear and pressure. Denies any redness or drainage. He also states he is burning, tingling to both of his feet. He has tried taking gabapentin for which she is just, 300 mg 3 times a day the last couple weeks. He states it has helped although the symptoms do continue. No swelling or redness. No  Claudication symptoms. No other complaints.     Review of Systems  Constitutional: Positive for unexpected weight change.       Objective:   Physical Exam General: AAO x3, NAD  Dermatological: Xerotic skin bilaterally.  Nails are hypertrophic, dystrophic, brittle, discolored, elongated 10. There is no surrounding erythema or drainage. There is tenderness to palpation overlying nails 1-5 bilaterally.There are no open sores, no preulcerative lesions, no rash or signs of infection present.  Vascular: Dorsalis Pedis artery and Posterior Tibial artery pedal pulses are 2/4 bilateral with immedate capillary fill time. Pedal hair growth present. No varicosities and no lower extremity edema present bilateral. There is no pain with calf compression, swelling, warmth, erythema.   Neruologic:  Protective sensation mildly decreased with [place] Weinstein monofilament, vibratory sensation decreased, Achilles tendon reflex intact.  Musculoskeletal: No gross boney pedal deformities bilateral. No pain, crepitus, or limitation noted with foot and ankle range of motion bilateral. Muscular strength 5/5 in all groups tested bilateral.  Gait: Unassisted, Nonantalgic.      Assessment & Plan:   59 year old male with symptomatic onychomycosis, neuropathy -Treatment options discussed including all alternatives, risks, and complications - discussed  neuropathy. Continue gabapentin. At the symptoms do not improve knots couple weeks may need to increase his dose. Follow-up with PCP. - nails debrided 10 without complications/bleeding. - discussed daily foot inspection. -Follow-up in 3 months or sooner if any problems arise. In the meantime, encouraged to call the office with any questions, concerns, change in symptoms.   Celesta Gentile, DPM

## 2014-12-15 ENCOUNTER — Telehealth: Payer: Self-pay | Admitting: Internal Medicine

## 2014-12-15 NOTE — Telephone Encounter (Signed)
Called Michael [name] with [facility] - will try to see pt  12/18/14. Pt aware.

## 2014-12-15 NOTE — Telephone Encounter (Signed)
Sarah from [facility] want to report pt refused to be seen 12/15/2014. Judson Roch will try to see the pt on Friday.

## 2014-12-23 ENCOUNTER — Telehealth: Payer: Self-pay | Admitting: Licensed Clinical Social Worker

## 2014-12-23 ENCOUNTER — Other Ambulatory Visit: Payer: Self-pay | Admitting: Internal Medicine

## 2014-12-23 ENCOUNTER — Telehealth: Payer: Self-pay | Admitting: Internal Medicine

## 2014-12-23 NOTE — Telephone Encounter (Signed)
Mr. Phou left message with CSW requesting assistance finding a one bedroom in [place].  CSW returned call and offered to pull list from [email or web address].  Pt agreeable with rent up to $350.  In addition to housing, pt states he would like to go over paperwork received from [facility].  Mr. Betit states he will be at [facility] tomorrow morning.  CSW will meet with Mr. Stigen following his scheduled [facility] appointment.

## 2014-12-23 NOTE — Telephone Encounter (Signed)
Rec'd phone call from [facility] [place] [facility] stated the was patient refusing to be seen by Home Health.When she called her hung up on her as well.  Patient has now been discharged from their services

## 2014-12-24 ENCOUNTER — Other Ambulatory Visit: Payer: Self-pay | Admitting: Internal Medicine

## 2014-12-24 ENCOUNTER — Encounter: Payer: Self-pay | Admitting: Internal Medicine

## 2014-12-24 ENCOUNTER — Ambulatory Visit (INDEPENDENT_AMBULATORY_CARE_PROVIDER_SITE_OTHER): Payer: Medicare Other | Admitting: Internal Medicine

## 2014-12-24 VITALS — BP 117/93 | HR 106 | Temp 97.9°F | Ht 64.0 in | Wt 135.9 lb

## 2014-12-24 DIAGNOSIS — E43 Unspecified severe protein-calorie malnutrition: Secondary | ICD-10-CM

## 2014-12-24 DIAGNOSIS — Z794 Long term (current) use of insulin: Secondary | ICD-10-CM | POA: Diagnosis not present

## 2014-12-24 DIAGNOSIS — K860 Alcohol-induced chronic pancreatitis: Secondary | ICD-10-CM | POA: Diagnosis present

## 2014-12-24 DIAGNOSIS — W19XXXD Unspecified fall, subsequent encounter: Secondary | ICD-10-CM

## 2014-12-24 DIAGNOSIS — E0842 Diabetes mellitus due to underlying condition with diabetic polyneuropathy: Secondary | ICD-10-CM

## 2014-12-24 LAB — GLUCOSE, CAPILLARY: Glucose-Capillary: 321 mg/dL — ABNORMAL HIGH (ref 65–99)

## 2014-12-24 MED ORDER — INSULIN DETEMIR 100 UNIT/ML FLEXPEN: PEN_INJECTOR | SUBCUTANEOUS | Status: DC

## 2014-12-24 MED ORDER — GLUCERNA SHAKE PO LIQD: 237.0000 mL | Freq: Three times a day (TID) | ORAL | Status: DC

## 2014-12-24 MED ORDER — GABAPENTIN 300 MG PO CAPS: 600.0000 mg | ORAL_CAPSULE | Freq: Three times a day (TID) | ORAL | Status: DC

## 2014-12-24 NOTE — Assessment & Plan Note (Signed)
Overview He reports running out of Glucerna shakes.  Assessment Severe protein calorie malnutrition the setting of chronic pancreatitis  Plan Prescribed Glucerna shakes with the instructions of taking them 3 times with meals

## 2014-12-24 NOTE — Progress Notes (Signed)
   Subjective:    Patient ID: Michael [name], male    DOB: [birth year], 59 y.o.   MRN: [id number]  HPI Michael [name] is a 59 year old male with alcohol use disorder complicated by chronic pancreatitis, withdrawal seizures, and severe protein calorie malnutrition who presents today for hospital follow-up. Please see assessment & plan for documentation of chronic medical problems.  He was recently hospitalized 12/03/14 through 12/10/14 for acute encephalopathy felt to be in the setting of alcohol withdrawal and hyperglycemia. He was also noted to have hyponatremia, hypokalemia, hypomagnesemia.  Review of Systems  Constitutional: Positive for activity change and unexpected weight change. Negative for appetite change.  Gastrointestinal: Positive for nausea, vomiting, abdominal pain and diarrhea. Negative for constipation and blood in stool.  Musculoskeletal: Positive for arthralgias and gait problem.  Neurological: Positive for dizziness and numbness.       Objective:   Physical Exam Constitutional: Thin, malnourished, middle-aged African-American male. No distress.  Head: Normocephalic and atraumatic.  Eyes: Conjunctivae are normal. Pupils are 4 mm, direct, consensual, near. Disconjugate gaze. Cardiovascular: Normal rate, regular rhythm and normal heart sounds.  No gallop, friction rub, murmur heard. Pulmonary/Chest: Effort normal. No respiratory distress. No wheezes, rales.  Abdominal: Soft. Bowel sounds are normal. No distension. Tenderness noted with palpation overlying the right upper quadrant area.  Neurological: Alert and oriented to person, place, and time. Coordination normal.  Skin: Warm and dry. Not diaphoretic.  Psychiatric: Affect appropriate.        Assessment & Plan:

## 2014-12-24 NOTE — Patient Instructions (Addendum)
Thank you for bringing your medicines today. This helps [place] keep you safe from mistakes.  We made a couple changes to her medications as please take note of them below:  #1: For your pancreas, please continue taking Creon each time you eat. We will also find out when we need to get another picture.  #2: For your nerve pain, please take 2 tablets of gabapentin 3 times a day.  #3: For your knee pain, I have ordered knee x-rays. Please go to the x-ray department at your convenience.  #4: For your diabetes, please take 18 units of insulin at bedtime. Do not forget your glucometer for your next visit.  Please see me back next week.   Progress Toward Treatment Goals:  Treatment Goal 08/12/2012  Hemoglobin A1C at goal  Blood pressure improved  Stop smoking -  Prevent falls -    Self Care Goals & Plans:  Self Care Goal 12/24/2014  Manage my medications take my medicines as prescribed; bring my medications to every visit; refill my medications on time  Monitor my health keep track of my blood glucose; bring my glucose meter and log to each visit; check my feet daily  Eat healthy foods eat more vegetables; eat foods that are low in salt; eat baked foods instead of fried foods  Be physically active find an activity I enjoy; take a walk every day  Stop smoking cut down the number of cigarettes smoked    Home Blood Glucose Monitoring 09/03/2012  Check my blood sugar 4 times a day  When to check my blood sugar -     Care Management & Community Referrals:  Referral 02/03/2013  Referrals made to [facility]; other (see comments)

## 2014-12-24 NOTE — Assessment & Plan Note (Signed)
Overview He reports ongoing bilateral knee pain ever since his hospitalization and thinks it may have been in the setting of falling down at the store when he sustained a hypoglycemic seizure. Pain is worse with twisting motion though not on flexion/extension. He describes the pain as throbbing in contrast to the shooting, burning pain which he experiences from his feet that radiate up. He does report some gait instability as well. Tylenol which was prescribed to him at his last office visit 1 month ago did little to relieve his pain.  Assessment Suspect acute inflammatory changes though cannot exclude arthritic pain.  Plan Order knee x-ray with plan to follow-up pending imaging results

## 2014-12-24 NOTE — Assessment & Plan Note (Addendum)
Lab Results  Component Value Date   HGBA1C 13.8* 12/04/2014   HGBA1C 11.8* 11/17/2014   HGBA1C 6.8* 10/09/2014     Assessment: Diabetes control: poor control (HgbA1C >9%) Progress toward A1C goal:  unable to assess Comments: Since discharge, his blood sugars have trended 215-230 on Levemir 15 units alone. He has stopped taking metformin as well. He does not have a glucometer with him today though reports checking his blood sugars 3 times daily with his meals. When his sugar runs high, he reports it feels as though "my head is opening up," though cannot recall symptoms preceding hypoglycemic episodes. Of note, he has had a history of recurrent seizures in the setting of hypoglycemia, but he denies having any additional episodes since his discharge. His weight today is 135 pounds, down from 143 pounds back on 12/14/14.  Plan: Medications:  Levemir 15 units Home glucose monitoring: Frequency: 3 times a day Timing: before breakfast, before lunch, before dinner Instruction/counseling given: reminded to bring blood glucose meter & log to each visit Educational resources provided: handout Self management tools provided:   Other plans: Given his elevated A1c, I suspect he can tolerate up titration of his Levemir. Using 0.3 units/kilogram for his dosing, he should be on 18 units. -After he left his appointment, review of the chart was notable for telephone message indicating that he had been discharged from his home health services. This needs to be addressed and followed given the complexity of care that he requires, especially with regards to his poorly controlled diabetes. -Increase gabapentin from 300 mg 3 times a day to 600 mg 3 times a day given his ongoing neuropathic pain

## 2014-12-24 NOTE — Assessment & Plan Note (Addendum)
Overview He reports ongoing abdominal pain which is localized to his right upper quadrant. He continues to smoke a pack every 3 days and has done so for the last 30 years. He denies any recent alcohol use following his hospital discharge. His appetite has been fluctuating though he has been taking Creon with each of his meals and has been associated with some loose stools.  Assessment Chronic pancreatitis in the setting of past alcohol use though cannot exclude malignancy given his ongoing weight loss, tobacco use, and other constitutional symptoms  Plan -Check trypsinogen today to assess pancreatic function -Order CT abdomen with and without contrast with pancreatic protocol per the instructions of radiology for 3 months from his last study though sooner should his clinical situation change -Advised the patient to continue taking Creon 1 capsule per meal  ADDENDUM 01/21/2015  3:17 AM:  Trypsinogen low at 6, consistent with impaired pancreatic function.

## 2014-12-25 ENCOUNTER — Other Ambulatory Visit: Payer: Self-pay

## 2014-12-25 ENCOUNTER — Encounter: Payer: Self-pay | Admitting: Licensed Clinical Social Worker

## 2014-12-25 MED ORDER — ACCU-CHEK SOFTCLIX LANCETS MISC
Status: DC
Start: 2014-12-25 — End: 2015-01-19

## 2014-12-25 NOTE — Telephone Encounter (Signed)
rec'd fax request from pharmacy for this specific lancet

## 2014-12-25 NOTE — Progress Notes (Signed)
CSW met with [name] during his scheduled [facility] appointment.  Pt had previously requested listed of available one bedroom apartments under $350/month.  CSW provided [name] with listing, all are currently on a waitlist.  CSW encouraged pt to place name of waitlist of apartments that are based on income.  [name] states he currently has his own bedroom and is staying with his sister and niece. Rent fluctuates between $300 - $400 and includes his portion of rent, utilities and food.  Pt is currently not receiving any food stamps, stating his was getting $16/month and needs to re-apply.  Pt has application form.  Pt inquired about [facility] pantry food, CSW provided [name] large bag of food items and personal hygiene items.  [name] with noted weight loss, CSW provided pt with coupons for Glucerna.  [name] has voiced multiple complaints of the local county transportation, CSW provided pt with information on potential Medicare Advantage plan that offers transportation and informational brochure on the [employer].  Pt denies add'l social work needs at this time.

## 2014-12-25 NOTE — Addendum Note (Signed)
Addended by: Riccardo Dubin on: 12/25/2014 08:15 AM   Modules accepted: Orders

## 2014-12-29 ENCOUNTER — Other Ambulatory Visit: Payer: Self-pay

## 2014-12-29 LAB — TRYPSINOGEN, BLOOD: TRYPSINOGEN: 6 ng/mL — AB (ref 19–68)

## 2014-12-29 NOTE — Progress Notes (Signed)
Internal Medicine Clinic Attending  Case discussed with Dr. Patel,Rushil at the time of the visit.  We reviewed the resident's history and exam and pertinent patient test results.  I agree with the assessment, diagnosis, and plan of care documented in the resident's note.  

## 2014-12-29 NOTE — Addendum Note (Signed)
Addended by: Gilles Chiquito B on: 12/29/2014 10:29 AM   Modules accepted: Level of Service

## 2015-01-04 ENCOUNTER — Telehealth: Payer: Self-pay | Admitting: Licensed Clinical Social Worker

## 2015-01-04 ENCOUNTER — Other Ambulatory Visit: Payer: Self-pay | Admitting: Internal Medicine

## 2015-01-04 NOTE — Telephone Encounter (Signed)
[name] placed call to CSW.  Pt states he received the wrong test strips for his Accu-Check Softclix meter.  "I received the white strips and I need the blue.  The white ones don't fit." Pt also requesting an [facility] f/u appointment to be scheduled before 12/23.  Pt informed the above request will be routed to the appropriate staff.

## 2015-01-05 ENCOUNTER — Other Ambulatory Visit: Payer: Self-pay | Admitting: *Deleted

## 2015-01-05 MED ORDER — GLUCOSE BLOOD VI STRP
ORAL_STRIP | Status: DC
Start: 2015-01-05 — End: 2015-03-17

## 2015-01-05 NOTE — Telephone Encounter (Signed)
[facility] cannot fill this pt wants to get at [facility]

## 2015-01-07 ENCOUNTER — Ambulatory Visit: Payer: Medicaid Other | Admitting: Pharmacist

## 2015-01-07 ENCOUNTER — Other Ambulatory Visit: Payer: Self-pay

## 2015-01-07 ENCOUNTER — Encounter: Payer: Self-pay | Admitting: Internal Medicine

## 2015-01-07 ENCOUNTER — Ambulatory Visit: Payer: Medicaid Other | Admitting: Internal Medicine

## 2015-01-19 ENCOUNTER — Other Ambulatory Visit: Payer: Self-pay | Admitting: Dietician

## 2015-01-19 DIAGNOSIS — K860 Alcohol-induced chronic pancreatitis: Secondary | ICD-10-CM

## 2015-01-19 MED ORDER — ACCU-CHEK FASTCLIX LANCETS MISC: Status: DC

## 2015-01-19 NOTE — Telephone Encounter (Signed)
Says he got the wrong lancets. He uses the accu chek fast clix. He also requested strips. CDE informed him that he has refills on the strips and should call [facility] and have them transfer his prescription to the [facility] near him.

## 2015-01-20 ENCOUNTER — Other Ambulatory Visit: Payer: Self-pay | Admitting: Internal Medicine

## 2015-01-20 ENCOUNTER — Ambulatory Visit (HOSPITAL_COMMUNITY): Payer: Medicaid Other

## 2015-01-21 ENCOUNTER — Other Ambulatory Visit: Payer: Self-pay | Admitting: *Deleted

## 2015-01-21 ENCOUNTER — Other Ambulatory Visit: Payer: Self-pay | Admitting: Internal Medicine

## 2015-01-21 DIAGNOSIS — K860 Alcohol-induced chronic pancreatitis: Secondary | ICD-10-CM

## 2015-01-21 MED ORDER — ACCU-CHEK SOFTCLIX LANCET DEV MISC: Status: DC

## 2015-01-21 NOTE — Telephone Encounter (Signed)
Requesting soft clix lancet -done.

## 2015-01-21 NOTE — Telephone Encounter (Signed)
Kweisi called from [place] requesting the nurse to call back.

## 2015-01-21 NOTE — Telephone Encounter (Signed)
Requesting soft clix lancets.

## 2015-01-28 ENCOUNTER — Encounter: Payer: Self-pay | Admitting: Internal Medicine

## 2015-01-28 ENCOUNTER — Ambulatory Visit (INDEPENDENT_AMBULATORY_CARE_PROVIDER_SITE_OTHER): Payer: Medicare Other | Admitting: Internal Medicine

## 2015-01-28 ENCOUNTER — Telehealth: Payer: Self-pay | Admitting: *Deleted

## 2015-01-28 VITALS — BP 129/94 | HR 114 | Temp 98.4°F | Ht 64.0 in | Wt 139.5 lb

## 2015-01-28 DIAGNOSIS — I10 Essential (primary) hypertension: Secondary | ICD-10-CM

## 2015-01-28 DIAGNOSIS — E119 Type 2 diabetes mellitus without complications: Secondary | ICD-10-CM

## 2015-01-28 DIAGNOSIS — K860 Alcohol-induced chronic pancreatitis: Secondary | ICD-10-CM | POA: Diagnosis not present

## 2015-01-28 DIAGNOSIS — F1021 Alcohol dependence, in remission: Secondary | ICD-10-CM

## 2015-01-28 DIAGNOSIS — Z794 Long term (current) use of insulin: Secondary | ICD-10-CM

## 2015-01-28 DIAGNOSIS — E1165 Type 2 diabetes mellitus with hyperglycemia: Secondary | ICD-10-CM

## 2015-01-28 DIAGNOSIS — E0842 Diabetes mellitus due to underlying condition with diabetic polyneuropathy: Secondary | ICD-10-CM

## 2015-01-28 DIAGNOSIS — E1142 Type 2 diabetes mellitus with diabetic polyneuropathy: Secondary | ICD-10-CM

## 2015-01-28 LAB — GLUCOSE, CAPILLARY: GLUCOSE-CAPILLARY: 140 mg/dL — AB (ref 65–99)

## 2015-01-28 LAB — HM DIABETES EYE EXAM

## 2015-01-28 MED ORDER — ACCU-CHEK FASTCLIX LANCETS MISC: Status: DC

## 2015-01-28 MED ORDER — GABAPENTIN 300 MG PO CAPS: ORAL_CAPSULE | ORAL | Status: DC

## 2015-01-28 NOTE — Telephone Encounter (Signed)
Mr Galla stopped rn on way into work this am and wanted her to get him some strips. He then left and called back, rn called pharm, spoke to [facility], medicard paperwork needs to be completed, he will fax and rn will complete

## 2015-01-28 NOTE — Patient Instructions (Signed)
1. Continue to check your sugar three times daily. 2. Take Gabapentin 600 mg every morning and afternoon. Take 900 mg before bed. 3. Return to clinic in 2 months.  Gabapentin capsules or tablets What is this medicine? GABAPENTIN (GA ba pen tin) is used to control partial seizures in adults with epilepsy. It is also used to treat certain types of nerve pain. This medicine may be used for other purposes; ask your health care provider or pharmacist if you have questions. What should I tell my health care provider before I take this medicine? They need to know if you have any of these conditions: -kidney disease -suicidal thoughts, plans, or attempt; a previous suicide attempt by you or a family member -an unusual or allergic reaction to gabapentin, other medicines, foods, dyes, or preservatives -pregnant or trying to get pregnant -breast-feeding How should I use this medicine? Take this medicine by mouth with a glass of water. Follow the directions on the prescription label. You can take it with or without food. If it upsets your stomach, take it with food.Take your medicine at regular intervals. Do not take it more often than directed. Do not stop taking except on your doctor's advice. If you are directed to break the 600 or 800 mg tablets in half as part of your dose, the extra half tablet should be used for the next dose. If you have not used the extra half tablet within 28 days, it should be thrown away. A special MedGuide will be given to you by the pharmacist with each prescription and refill. Be sure to read this information carefully each time. Talk to your pediatrician regarding the use of this medicine in children. Special care may be needed. Overdosage: If you think you have taken too much of this medicine contact a poison control center or emergency room at once. NOTE: This medicine is only for you. Do not share this medicine with others. What if I miss a dose? If you miss a dose,  take it as soon as you can. If it is almost time for your next dose, take only that dose. Do not take double or extra doses. What may interact with this medicine? Do not take this medicine with any of the following medications: -other gabapentin products This medicine may also interact with the following medications: -alcohol -antacids -antihistamines for allergy, cough and cold -certain medicines for anxiety or sleep -certain medicines for depression or psychotic disturbances -homatropine; hydrocodone -naproxen -narcotic medicines (opiates) for pain -phenothiazines like chlorpromazine, mesoridazine, prochlorperazine, thioridazine This list may not describe all possible interactions. Give your health care provider a list of all the medicines, herbs, non-prescription drugs, or dietary supplements you use. Also tell them if you smoke, drink alcohol, or use illegal drugs. Some items may interact with your medicine. What should I watch for while using this medicine? Visit your doctor or health care professional for regular checks on your progress. You may want to keep a record at home of how you feel your condition is responding to treatment. You may want to share this information with your doctor or health care professional at each visit. You should contact your doctor or health care professional if your seizures get worse or if you have any new types of seizures. Do not stop taking this medicine or any of your seizure medicines unless instructed by your doctor or health care professional. Stopping your medicine suddenly can increase your seizures or their severity. Wear a medical identification bracelet or  chain if you are taking this medicine for seizures, and carry a card that lists all your medications. You may get drowsy, dizzy, or have blurred vision. Do not drive, use machinery, or do anything that needs mental alertness until you know how this medicine affects you. To reduce dizzy or fainting  spells, do not sit or stand up quickly, especially if you are an older patient. Alcohol can increase drowsiness and dizziness. Avoid alcoholic drinks. Your mouth may get dry. Chewing sugarless gum or sucking hard candy, and drinking plenty of water will help. The use of this medicine may increase the chance of suicidal thoughts or actions. Pay special attention to how you are responding while on this medicine. Any worsening of mood, or thoughts of suicide or dying should be reported to your health care professional right away. Women who become pregnant while using this medicine may enroll in the [place] Pregnancy Registry by calling [phone]. This registry collects information about the safety of antiepileptic drug use during pregnancy. What side effects may I notice from receiving this medicine? Side effects that you should report to your doctor or health care professional as soon as possible: -allergic reactions like skin rash, itching or hives, swelling of the face, lips, or tongue -worsening of mood, thoughts or actions of suicide or dying Side effects that usually do not require medical attention (report to your doctor or health care professional if they continue or are bothersome): -constipation -difficulty walking or controlling muscle movements -dizziness -nausea -slurred speech -tiredness -tremors -weight gain This list may not describe all possible side effects. Call your doctor for medical advice about side effects. You may report side effects to FDA at 1-800-FDA-1088. Where should I keep my medicine? Keep out of reach of children. This medicine may cause accidental overdose and death if it taken by other adults, children, or pets. Mix any unused medicine with a substance like cat litter or coffee grounds. Then throw the medicine away in a sealed container like a sealed bag or a coffee can with a lid. Do not use the medicine after the expiration  date. Store at room temperature between 15 and 30 degrees C (59 and 86 degrees F). NOTE: This sheet is a summary. It may not cover all possible information. If you have questions about this medicine, talk to your doctor, pharmacist, or health care provider.    2016, Elsevier/Gold Standard. (2013-03-07 15:26:50)

## 2015-01-28 NOTE — Addendum Note (Signed)
Addended by: Gilles Chiquito B on: 01/28/2015 10:40 AM   Modules accepted: Level of Service

## 2015-01-28 NOTE — Progress Notes (Signed)
Patient ID: Michael [name], male   DOB: [birth year], 58 y.o.   MRN: [id number]    Subjective:   Patient ID: Michael [name] male   DOB: Oct 08, 1955 60 y.o.   MRN: [id number]  HPI: [name] is a 60 y.o. male with PMH as below, here for follow up of DMII, and HTN.  Please see Problem-Based charting for the status of the patient's chronic medical issues.     No past medical history on file. Current Outpatient Prescriptions  Medication Sig Dispense Refill  . ACCU-CHEK FASTCLIX LANCETS MISC Use to check your blood sugar 3 times a day 102 each 5  . acetaminophen (TYLENOL) 500 MG tablet Take 1 tablet (500 mg total) by mouth every 6 (six) hours as needed. (Patient not taking: Reported on 12/24/2014) 100 tablet 2  . ARIPiprazole (ABILIFY) 2 MG tablet Take 2 mg by mouth daily.    . ARIPiprazole (ABILIFY) 20 MG tablet Take 20 mg by mouth daily.    Marland Kitchen aspirin EC 81 MG tablet Take 1 tablet (81 mg total) by mouth at bedtime. 90 tablet 0  . Blood Glucose Monitoring Suppl (ACCU-CHEK AVIVA PLUS) W/DEVICE KIT Check blood sugar up to 3 times a day 1 kit 1  . feeding supplement, GLUCERNA SHAKE, (GLUCERNA SHAKE) LIQD Take 237 mLs by mouth 3 (three) times daily between meals. 90 Can 6  . FLUoxetine (PROZAC) 20 MG capsule Take 20 mg by mouth 2 (two) times daily.     . folic acid (FOLVITE) 1 MG tablet TK 1 T PO D  3  . gabapentin (NEURONTIN) 300 MG capsule TAKE TWO CAPSULES BY MOUTH THREE TIMES DAILY 90 capsule 1  . glucose blood (ACCU-CHEK AVIVA PLUS) test strip Use to check blood sugar 3 to 4 times daily. diag code E11.9. Insulin dependent 100 each 11  . Insulin Detemir (LEVEMIR FLEXTOUCH) 100 UNIT/ML Pen Inject 18 units daily before bedtime 18 mL 3  . Insulin Pen Needle 31G X 5 MM MISC Use to inject 15 units of Levemir one time a day 100 each 5  . Insulin Syringe-Needle U-100 (B-D INSULIN SYRINGE) 31G X 5/16" 0.3 ML MISC Use to inject insulin into the skin at bedtime each day. diag code E11.40. Insulin  dependent 90 each 0  . Lancet Devices (ACCU-CHEK SOFTCLIX) lancets Use to check your blood sugar 3 times a day. Dx code E11.65. Insulin dependent. 102 each 5  . lipase/protease/amylase (CREON) 36000 UNITS CPEP capsule Take 1 capsule (36,000 Units total) by mouth 3 (three) times daily. 90 capsule 0  . lovastatin (MEVACOR) 40 MG tablet Take 1 tablet (40 mg total) by mouth at bedtime. (Patient not taking: Reported on 12/24/2014) 30 tablet 2  . magnesium oxide (MAG-OX) 400 (241.3 MG) MG tablet Take 1 tablet (400 mg total) by mouth daily. 30 tablet 3  . Multiple Vitamin (MULTIVITAMIN WITH MINERALS) TABS tablet Take 1 tablet by mouth daily.    . pantoprazole (PROTONIX) 40 MG tablet Take 1 tablet (40 mg total) by mouth daily. 90 tablet 0  . Vitamin D, Ergocalciferol, (DRISDOL) 50000 UNITS CAPS capsule Take 1 capsule (50,000 Units total) by mouth every 7 (seven) days. 7 capsule 0   No current facility-administered medications for this visit.   Family History  Problem Relation Age of Onset  . Colon cancer Neg Hx   . Stomach cancer Neg Hx   . Anesthesia problems Neg Hx   . Hypotension Neg Hx   . Pseudochol deficiency Neg  Hx   . Malignant hyperthermia Neg Hx   . Hypertension Sister   . Diabetes Sister   . Heart disease Sister    Social History   Social History  . Marital Status: Single    Spouse Name: N/A  . Number of Children: 0  . Years of Education: 11   Occupational History  .    [employer]    worked for 6 years  . 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 Smoking status: Current Every Day Smoker -- 0.60 packs/day for 40 years    Types: Cigarettes  . Smokeless tobacco: Never Used     Comment: 4-6 CIGARETTES  . Alcohol Use: 6.0 oz/week    10 Standard drinks or equivalent per week     Comment: 11/17/2014 "was drinking 2-5 drinks; stopped ~ 4-5 months"  . Drug Use: Yes    Special: Marijuana     Comment: 11/17/2014  "maybe once/month"  . Sexual Activity: Yes   Other Topics Concern  . Not on file   Social History Narrative   Has been living at 3 different friend's homes since discharge 01/2011. Was living in the shelter prior to admission. Had previously lived with his niece and other family members but he has gotten kicked out each time.    Review of Systems: A comprehensive review of systems was negative except for: runny nose and intermittent fever/chills. Objective:  Physical Exam: Filed Vitals:   01/28/15 0925  BP: 129/94  Pulse: 114  Temp: 98.4 F (36.9 C)  TempSrc: Oral  Height: 5' 4" (1.626 m)  Weight: 139 lb 8 oz (63.277 kg)  SpO2: 100%   Physical Exam  Constitutional: He is oriented to person, place, and time and well-developed, well-nourished, and in no distress. No distress.  HENT:  Head: Normocephalic and atraumatic.  Eyes: EOM are normal.  Neck: No JVD present. No tracheal deviation present.  Cardiovascular: Regular rhythm, normal heart sounds and intact distal pulses.   Tachycardic. Regular rhythm.  Pulmonary/Chest: Effort normal and breath sounds normal. No stridor. No respiratory distress. He has no wheezes.  Abdominal: Soft. He exhibits no distension. There is no tenderness. There is no rebound and no guarding.  Musculoskeletal: He exhibits no edema.  Neurological: He is alert and oriented to person, place, and time.  Skin: Skin is warm and dry. No rash noted. He is not diaphoretic.     Assessment & Plan:   Patient and case were discussed with Dr. Daryll Drown.  Please refer to Problem Based charting for further documentation.

## 2015-01-28 NOTE — Addendum Note (Signed)
Addended by: Resa Miner on: 01/28/2015 10:34 AM   Modules accepted: Orders

## 2015-01-28 NOTE — Progress Notes (Signed)
Internal Medicine Clinic Attending  Case discussed with Dr. Taylor at the time of the visit.  We reviewed the resident's history and exam and pertinent patient test results.  I agree with the assessment, diagnosis, and plan of care documented in the resident's note. 

## 2015-01-28 NOTE — Assessment & Plan Note (Signed)
Lab Results  Component Value Date   HGBA1C 13.8* 12/04/2014   HGBA1C 11.8* 11/17/2014   HGBA1C 6.8* 10/09/2014    Patient currently taking Levemir18 U qHS.  His glucose in clinic is 140.  States they are normally 150-200 in the morning when he checks them.  He may feel like he has a low blood sugar 1-2x/week, at which point he eats something small.  No polyuria or polydipsia.  He continues to have peripheral neuropathy, improved with Gabapentin 600 gm TID, but not resolved.   Assessment: Diabetes control:  poor Progress toward A1C goal:    Comments: Insulin titration must be slow and careful given his history of hypoglycemic seizure.    Plan: Medications:  continue current medications, Levemir 18 U qHS.  Assess need for change with next A1c and glucometer readings. Home glucose monitoring: Frequency:  TID Timing:  before meals Instruction/counseling given: reminded to bring blood glucose meter & log to each visit and discussed foot care Educational resources provided: handout Self management tools provided:   Other plans: titrate Levemir slowly

## 2015-01-28 NOTE — Assessment & Plan Note (Addendum)
BP Readings from Last 3 Encounters:  01/28/15 129/94  12/24/14 117/93  12/14/14 137/84    Lab Results  Component Value Date   NA 139 12/09/2014   K 3.6 12/09/2014   CREATININE 0.39* 12/10/2014    Assessment: BP controlled off medications. No lightheadedness or dizziness.  His pulse is elevated and regular, consistent with sinus tach seen on review of previous ECGs.  No SOB or CP.  Patient is high energy and walked into clinic, which may account for it at this time.  Blood pressure control:  controlled Progress toward BP goal:    Comments: patient not currently on any medications.  Plan: Consider future workup of sinus tach. Medications:  continue to monitor BP.  Recheck BP and Microalbumin at next visit. Educational resources provided:   Self management tools provided:   Other plans:

## 2015-01-28 NOTE — Assessment & Plan Note (Signed)
Patient has had 3-5 lb weight gain since last visit.  He states he is eating well and drinking Glucerna shakes.  He has an MRCP scheduled for 02/01/15 and he knows to attend.  No night sweats or weight loss. He feels good overall.  A/P: Given chronic alcoholic pancreatitis, there is still concern for underlying malignancy.  Will await results of MRCP for imaging of pancreas and consider need for CT abdomen at that time. Otherwise, continue nutrition supplementation. - MRCP pending.

## 2015-01-29 ENCOUNTER — Telehealth: Payer: Self-pay | Admitting: Dietician

## 2015-01-29 NOTE — Telephone Encounter (Signed)
Medicare will not accept form as is filled out with the frequency marked out.  I am waiting on them to fax me a blank form so that I can request Dr. Naaman Plummer sign it again.  Still following this ongoing concern for [name]  Thanks Edwena Blow

## 2015-01-29 NOTE — Telephone Encounter (Signed)
Michael [name] placed call to CSW direct line.  Pt requesting this worker to notify triage [name], form will need to be re-faxed.  Pt states form was filled out incorrectly per [facility] and needed to be re-faxed to [employer].  Michael [name] states this is regarding his Accu-chek.  Pt aware CSW will forward to traige [name].  Pt requesting Michael [name] to notify him at [phone].

## 2015-01-29 NOTE — Telephone Encounter (Signed)
Paperwork was signed and faxed 12/28.  I have refaxed now to both the local [facility] and to the medicare processing center.  Speaking with pharmacy as well.  They will call me back if any additional issues arise.

## 2015-01-29 NOTE — Telephone Encounter (Signed)
Update.  New form sent to [facility], verified receipt by pharmacist.    Pharmacist calls back and states that insurance will not pay BECAUSE, RX was filled by another pharmacy on 01/26/14.  [facility] filled on 1/4 and delivered to pt home.  They have signed receipt.  Pt is telling [facility] he hasn't gotten it.    I spoke with [facility] and on 1/4 they delivered the patient test strips, lovastatin, and his levemir flex touch.  He last rec'd lancets on 12/5 from them.  Not sure how else to help.

## 2015-01-29 NOTE — Telephone Encounter (Signed)
Called pt to advise that strips cannot be refilled because he rec'd a delivery by [facility] on 1/4.  He denies receiving this delivery even though [facility] pharmacy rep says she was on the phone with pt as delivery driver pulled into pt driveway (he had called them asking about delivery and driver arrived during phone call). I called [facility] to clarify that it seems pt is in need of his lancets not his strips per his conversation with Butch Penny.  [facility] [address] is out of stock but assures me that pt can go to the [facility] on [address] and get his supplies now that paperwork issue is resolved.  I explained this to patient and he became excited and spoke in an incomprehensible manner but alluded to needed both strips and lancets but stated "don't worry about it."   I told Michael [name] it is only time for lancets and he would need to locate the strips he rec'd on 1/4 from [facility] pharmacy.  Dr. Maudie Mercury, Edwena Blow:  Is this situation one we should discuss for possible closer monitoring?  Maybe it would be wise if he were to only utilize 1 pharmacy so that his supplies/medications could be better managed.

## 2015-01-29 NOTE — Telephone Encounter (Signed)
Patient calls frustrated that he could not get his lancets yesterday because the pharmacy says they are waiting from a paper from our office.

## 2015-01-29 NOTE — Telephone Encounter (Signed)
Just spoke with pt as well.  He will call me back after lunch if he has not heard from [facility].

## 2015-02-01 ENCOUNTER — Ambulatory Visit (HOSPITAL_COMMUNITY)
Admission: RE | Admit: 2015-02-01 | Discharge: 2015-02-01 | Disposition: A | Discharge status: Home / Self Care | Payer: Medicare Other | Source: Ambulatory Visit | Attending: Oncology | Admitting: Oncology

## 2015-02-01 ENCOUNTER — Other Ambulatory Visit: Payer: Self-pay | Admitting: Internal Medicine

## 2015-02-01 ENCOUNTER — Ambulatory Visit (HOSPITAL_COMMUNITY)
Admission: RE | Admit: 2015-02-01 | Discharge: 2015-02-01 | Disposition: A | Discharge status: Home / Self Care | Payer: Medicare Other | Source: Ambulatory Visit | Attending: Internal Medicine | Admitting: Internal Medicine

## 2015-02-01 DIAGNOSIS — M179 Osteoarthritis of knee, unspecified: Secondary | ICD-10-CM | POA: Diagnosis not present

## 2015-02-01 DIAGNOSIS — M25562 Pain in left knee: Secondary | ICD-10-CM | POA: Diagnosis not present

## 2015-02-01 DIAGNOSIS — K8689 Other specified diseases of pancreas: Secondary | ICD-10-CM | POA: Insufficient documentation

## 2015-02-01 DIAGNOSIS — K860 Alcohol-induced chronic pancreatitis: Secondary | ICD-10-CM | POA: Diagnosis not present

## 2015-02-01 DIAGNOSIS — M25561 Pain in right knee: Secondary | ICD-10-CM | POA: Insufficient documentation

## 2015-02-01 DIAGNOSIS — W19XXXD Unspecified fall, subsequent encounter: Secondary | ICD-10-CM

## 2015-02-01 DIAGNOSIS — K828 Other specified diseases of gallbladder: Secondary | ICD-10-CM | POA: Diagnosis not present

## 2015-02-01 DIAGNOSIS — R937 Abnormal findings on diagnostic imaging of other parts of musculoskeletal system: Secondary | ICD-10-CM | POA: Diagnosis not present

## 2015-02-01 DIAGNOSIS — I739 Peripheral vascular disease, unspecified: Secondary | ICD-10-CM | POA: Insufficient documentation

## 2015-02-01 DIAGNOSIS — M25569 Pain in unspecified knee: Secondary | ICD-10-CM | POA: Diagnosis present

## 2015-02-01 DIAGNOSIS — R935 Abnormal findings on diagnostic imaging of other abdominal regions, including retroperitoneum: Secondary | ICD-10-CM | POA: Diagnosis not present

## 2015-02-01 LAB — CREATININE, SERUM: CREATININE: 0.62 mg/dL (ref 0.61–1.24)

## 2015-02-01 MED ORDER — GADOBENATE DIMEGLUMINE 529 MG/ML IV SOLN
15.0000 mL | Freq: Once | INTRAVENOUS | Status: AC
  Administered 2015-02-01: 13 mL via INTRAVENOUS

## 2015-02-01 NOTE — Telephone Encounter (Signed)
Thank you Michael [name].  I agree.  I voiced concern to Christene Slates of the potential for patient misusing his diabetic supplies.

## 2015-02-11 ENCOUNTER — Other Ambulatory Visit: Payer: Self-pay

## 2015-02-11 ENCOUNTER — Other Ambulatory Visit: Payer: Self-pay | Admitting: Internal Medicine

## 2015-02-11 DIAGNOSIS — K860 Alcohol-induced chronic pancreatitis: Secondary | ICD-10-CM

## 2015-02-11 MED ORDER — PANCRELIPASE (LIP-PROT-AMYL) 36000-114000 UNITS PO CPEP: 36000.0000 [IU] | ORAL_CAPSULE | Freq: Three times a day (TID) | ORAL | Status: DC

## 2015-02-17 ENCOUNTER — Encounter: Payer: Self-pay | Admitting: Internal Medicine

## 2015-02-17 ENCOUNTER — Other Ambulatory Visit: Payer: Self-pay | Admitting: Internal Medicine

## 2015-02-17 DIAGNOSIS — E785 Hyperlipidemia, unspecified: Secondary | ICD-10-CM | POA: Insufficient documentation

## 2015-02-23 ENCOUNTER — Encounter: Payer: Self-pay | Admitting: Dietician

## 2015-02-24 ENCOUNTER — Telehealth: Payer: Self-pay | Admitting: Licensed Clinical Social Worker

## 2015-02-24 ENCOUNTER — Encounter: Payer: Self-pay | Admitting: Licensed Clinical Social Worker

## 2015-02-24 NOTE — Telephone Encounter (Signed)
Mr. Tevebaugh placed call to CSW this afternoon.  Pt requesting to meet with CSW to discuss life insurance programs.  CSW informed Mr. Morie this worker will not be available on the same date as his scheduled [facility] appointment but pt made aware of this workers availability.  CSW will mail pt information on [employer] [facility] as a resources, as well as information from [place].

## 2015-02-25 ENCOUNTER — Encounter: Payer: Self-pay | Admitting: Licensed Clinical Social Worker

## 2015-02-26 ENCOUNTER — Other Ambulatory Visit: Payer: Self-pay | Admitting: *Deleted

## 2015-02-26 ENCOUNTER — Other Ambulatory Visit: Payer: Self-pay | Admitting: Internal Medicine

## 2015-02-26 MED ORDER — VITAMIN D (ERGOCALCIFEROL) 1.25 MG (50000 UNIT) PO CAPS: 50000.0000 [IU] | ORAL_CAPSULE | ORAL | Status: DC

## 2015-02-26 NOTE — Telephone Encounter (Signed)
Molly from [facility] requesting Vitamin D to be filled. The pharmacy would like it to be filled by today so that they can deliver to the patient.

## 2015-02-26 NOTE — Telephone Encounter (Signed)
Refill request sent to Dr Naaman Plummer.

## 2015-02-26 NOTE — Telephone Encounter (Signed)
Dr Naaman Plummer on night float.

## 2015-02-26 NOTE — Telephone Encounter (Signed)
They will be delivering his meds today.  Thanks

## 2015-03-04 ENCOUNTER — Other Ambulatory Visit: Payer: Self-pay | Admitting: Internal Medicine

## 2015-03-04 ENCOUNTER — Telehealth: Payer: Self-pay | Admitting: Dietician

## 2015-03-04 DIAGNOSIS — E0842 Diabetes mellitus due to underlying condition with diabetic polyneuropathy: Secondary | ICD-10-CM

## 2015-03-04 DIAGNOSIS — Z794 Long term (current) use of insulin: Principal | ICD-10-CM

## 2015-03-04 NOTE — Telephone Encounter (Signed)
Patient wanted to know if CDE could recommend reading glasses based on retinal exam done here last month. Informed him that these are over the counter and that last month's exam would not help. He verbalized understanding.  CDE reviewed eye exams from past to see what eye doctor he has seen in the past and noted that he was a glaucoma suspect. He should be referred to ophthalmology for follow up.

## 2015-03-04 NOTE — Telephone Encounter (Signed)
Yes put in opthalmology referral, thanks!  Dr. Naaman Plummer

## 2015-03-08 ENCOUNTER — Encounter (HOSPITAL_COMMUNITY): Payer: Self-pay | Admitting: Emergency Medicine

## 2015-03-08 ENCOUNTER — Emergency Department (HOSPITAL_COMMUNITY)
Admission: EM | Admit: 2015-03-08 | Discharge: 2015-03-08 | Disposition: A | Discharge status: Home / Self Care | Payer: Medicare Other | Attending: Emergency Medicine | Admitting: Emergency Medicine

## 2015-03-08 DIAGNOSIS — K2970 Gastritis, unspecified, without bleeding: Secondary | ICD-10-CM

## 2015-03-08 DIAGNOSIS — E119 Type 2 diabetes mellitus without complications: Secondary | ICD-10-CM | POA: Diagnosis not present

## 2015-03-08 DIAGNOSIS — K2900 Acute gastritis without bleeding: Secondary | ICD-10-CM | POA: Diagnosis not present

## 2015-03-08 DIAGNOSIS — F17210 Nicotine dependence, cigarettes, uncomplicated: Secondary | ICD-10-CM | POA: Diagnosis not present

## 2015-03-08 DIAGNOSIS — Z794 Long term (current) use of insulin: Secondary | ICD-10-CM | POA: Diagnosis not present

## 2015-03-08 DIAGNOSIS — R109 Unspecified abdominal pain: Secondary | ICD-10-CM | POA: Diagnosis present

## 2015-03-08 DIAGNOSIS — Z7982 Long term (current) use of aspirin: Secondary | ICD-10-CM | POA: Insufficient documentation

## 2015-03-08 DIAGNOSIS — Z79899 Other long term (current) drug therapy: Secondary | ICD-10-CM | POA: Diagnosis not present

## 2015-03-08 LAB — RAPID URINE DRUG SCREEN, HOSP PERFORMED
Amphetamines: NOT DETECTED
BARBITURATES: NOT DETECTED
BENZODIAZEPINES: NOT DETECTED
COCAINE: NOT DETECTED
Opiates: NOT DETECTED
Tetrahydrocannabinol: NOT DETECTED

## 2015-03-08 LAB — LIPASE, BLOOD: LIPASE: 13 U/L (ref 11–51)

## 2015-03-08 LAB — CBC WITH DIFFERENTIAL/PLATELET
Basophils Absolute: 0 10*3/uL (ref 0.0–0.1)
Basophils Relative: 1 %
EOS PCT: 4 %
Eosinophils Absolute: 0.3 10*3/uL (ref 0.0–0.7)
HEMATOCRIT: 47 % (ref 39.0–52.0)
Hemoglobin: 16 g/dL (ref 13.0–17.0)
LYMPHS ABS: 3 10*3/uL (ref 0.7–4.0)
LYMPHS PCT: 47 %
MCH: 32.7 pg (ref 26.0–34.0)
MCHC: 34 g/dL (ref 30.0–36.0)
MCV: 96.1 fL (ref 78.0–100.0)
MONO ABS: 0.3 10*3/uL (ref 0.1–1.0)
MONOS PCT: 5 %
Neutro Abs: 2.7 10*3/uL (ref 1.7–7.7)
Neutrophils Relative %: 43 %
PLATELETS: 163 10*3/uL (ref 150–400)
RBC: 4.89 MIL/uL (ref 4.22–5.81)
RDW: 14.6 % (ref 11.5–15.5)
WBC: 6.3 10*3/uL (ref 4.0–10.5)

## 2015-03-08 LAB — COMPREHENSIVE METABOLIC PANEL
ALT: 32 U/L (ref 17–63)
AST: 34 U/L (ref 15–41)
Albumin: 4.3 g/dL (ref 3.5–5.0)
Alkaline Phosphatase: 112 U/L (ref 38–126)
Anion gap: 13 (ref 5–15)
BILIRUBIN TOTAL: 0.4 mg/dL (ref 0.3–1.2)
BUN: 11 mg/dL (ref 6–20)
CHLORIDE: 106 mmol/L (ref 101–111)
CO2: 23 mmol/L (ref 22–32)
CREATININE: 0.66 mg/dL (ref 0.61–1.24)
Calcium: 8.7 mg/dL — ABNORMAL LOW (ref 8.9–10.3)
Glucose, Bld: 113 mg/dL — ABNORMAL HIGH (ref 65–99)
POTASSIUM: 4.1 mmol/L (ref 3.5–5.1)
Sodium: 142 mmol/L (ref 135–145)
TOTAL PROTEIN: 7.2 g/dL (ref 6.5–8.1)

## 2015-03-08 LAB — CBG MONITORING, ED: Glucose-Capillary: 98 mg/dL (ref 65–99)

## 2015-03-08 LAB — ETHANOL: ALCOHOL ETHYL (B): 255 mg/dL — AB (ref <5)

## 2015-03-08 MED ORDER — PANTOPRAZOLE SODIUM 40 MG PO TBEC
40.0000 mg | DELAYED_RELEASE_TABLET | Freq: Once | ORAL | Status: AC
  Administered 2015-03-08: 40 mg via ORAL
  Filled 2015-03-08: qty 1

## 2015-03-08 MED ORDER — SUCRALFATE 1 GM/10ML PO SUSP: 1.0000 g | Freq: Three times a day (TID) | ORAL | Status: DC

## 2015-03-08 MED ORDER — GI COCKTAIL ~~LOC~~
30.0000 mL | Freq: Once | ORAL | Status: AC
  Administered 2015-03-08: 30 mL via ORAL
  Filled 2015-03-08: qty 30

## 2015-03-08 MED ORDER — PANTOPRAZOLE SODIUM 20 MG PO TBEC: 20.0000 mg | DELAYED_RELEASE_TABLET | Freq: Every day | ORAL | Status: DC

## 2015-03-08 NOTE — ED Notes (Addendum)
Pt transported via EMS from street. Pt c/o abd pain, strong odor etoh, hx of pancreatitis Pt states he has been having difficulty keeping BS under control. Pt states he did drink 1 beer tonight for pain in his legs d/t diabetes. Pt c/o pain to upper abdomen.

## 2015-03-08 NOTE — ED Provider Notes (Signed)
CSN: [id number]     Arrival date & time 03/08/15  0154 History   First MD Initiated Contact with Patient 03/08/15 0231     Chief Complaint  Patient presents with  . Abdominal Pain     (Consider location/radiation/quality/duration/timing/severity/associated sxs/prior Treatment) HPI  Patient to the ER for evaluation of abdominal pain. He admits that he may have had one or 2 beers to help with his pain control of his stomach in his lower extremities. He has a history of pancreatitis and understands that alcohol does make this worse. He says that a lot of things have been going on right now he is having hard time keeping his life under control. Currently his pain is not severe he rates at a 4 out of 10.  PCP: Juluis Mire, MD   ROS: The patient denies diaphoresis, fever, headache, weakness (general or focal), confusion, change of vision,  dysphagia, aphagia, shortness of breath, nausea, vomiting, diarrhea, lower extremity swelling, rash, neck pain, chest pain   Past Medical History  Diagnosis Date  . Pancreatitis   . Diabetes mellitus without complication ([place])   . Seizures [facility])    Past Surgical History  Procedure Laterality Date  . Esophagogastroduodenoscopy  03/22/2011    Procedure: ESOPHAGOGASTRODUODENOSCOPY (EGD);  Surgeon: Gatha Mayer, MD;  Location: [facility];  Service: Endoscopy;  Laterality: N/A;  egd with balloon   . Balloon dilation  03/22/2011    Procedure: BALLOON DILATION;  Surgeon: Gatha Mayer, MD;  Location: [facility];  Service: Endoscopy;  Laterality: N/A;  . Eus  04/20/2011    Procedure: UPPER ENDOSCOPIC ULTRASOUND (EUS) LINEAR;  Surgeon: Milus Banister, MD;  Location: [facility];  Service: Endoscopy;  Laterality: N/A;  . Colonoscopy w/ biopsies and polypectomy  09/12/11  . Colonoscopy N/A 05/22/2012    Procedure: COLONOSCOPY;  Surgeon: Gatha Mayer, MD;  Location: [place] [place];  Service: Endoscopy;  Laterality: N/A;   Family History  Problem  Relation Age of Onset  . Colon cancer Neg Hx   . Stomach cancer Neg Hx   . Anesthesia problems Neg Hx   . Hypotension Neg Hx   . Pseudochol deficiency Neg Hx   . Malignant hyperthermia Neg Hx   . Hypertension Sister   . Diabetes Sister   . Heart disease Sister    Social History  Substance Use Topics  . Smoking status: Current Every Day Smoker -- 0.60 packs/day for 40 years    Types: Cigarettes  . Smokeless tobacco: Never Used     Comment: 4-6 CIGARETTES  . Alcohol Use: 6.0 oz/week    10 Standard drinks or equivalent per week     Comment: 11/17/2014 "was drinking 2-5 drinks; stopped ~ 4-5 months"    Review of Systems  Review of Systems All other systems negative except as documented in the HPI. All pertinent positives and negatives as reviewed in the HPI.   Allergies  Ace inhibitors and Losartan  Home Medications   Prior to Admission medications   Medication Sig Start Date End Date Taking? Authorizing Provider  ACCU-CHEK FASTCLIX LANCETS MISC Use to check your blood sugar 3 times a day 01/28/15  Yes Iline Oven, MD  ARIPiprazole (ABILIFY) 2 MG tablet Take 2 mg by mouth daily.   Yes Historical Provider, MD  ARIPiprazole (ABILIFY) 20 MG tablet Take 20 mg by mouth daily.   Yes Historical Provider, MD  aspirin EC 81 MG tablet Take 1 tablet (81 mg total) by mouth at bedtime.  12/11/14  Yes Bartholomew Crews, MD  Blood Glucose Monitoring Suppl (ACCU-CHEK AVIVA PLUS) W/DEVICE KIT Check blood sugar up to 3 times a day 11/26/14  Yes Marjan Rabbani, MD  feeding supplement, GLUCERNA SHAKE, (GLUCERNA SHAKE) LIQD Take 237 mLs by mouth 3 (three) times daily between meals. 12/24/14  Yes Rushil Sherrye Payor, MD  FLUoxetine (PROZAC) 20 MG capsule Take 20 mg by mouth 2 (two) times daily.    Yes Historical Provider, MD  folic acid (FOLVITE) 1 MG tablet TAKE 1 TABLET BY MOUTH DAILY 10/15/14  Yes Historical Provider, MD  gabapentin (NEURONTIN) 300 MG capsule Take 2 capsules (600 mg) by mouth every  morning and afternoon.  Take 3 capsules (900 mg) at night before bed. 01/28/15  Yes Iline Oven, MD  glucose blood (ACCU-CHEK AVIVA PLUS) test strip Use to check blood sugar 3 to 4 times daily. diag code E11.9. Insulin dependent 01/05/15  Yes Marjan Rabbani, MD  Insulin Detemir (LEVEMIR FLEXTOUCH) 100 UNIT/ML Pen Inject 18 units daily before bedtime 12/24/14  Yes Rushil Sherrye Payor, MD  Insulin Pen Needle 31G X 5 MM MISC Use to inject 15 units of Levemir one time a day 11/26/14  Yes Marjan Rabbani, MD  Insulin Syringe-Needle U-100 (B-D INSULIN SYRINGE) 31G X 5/16" 0.3 ML MISC Use to inject insulin into the skin at bedtime each day. diag code E11.40. Insulin dependent 11/25/14  Yes Norval Gable, MD  Lancet Devices [facility]) lancets Use to check your blood sugar 3 times a day. Dx code E11.65. Insulin dependent. 01/21/15  Yes Juluis Mire, MD  lipase/protease/amylase (CREON) 36000 UNITS CPEP capsule Take 1 capsule (36,000 Units total) by mouth 3 (three) times daily. 02/11/15  Yes Marjan Rabbani, MD  lovastatin (MEVACOR) 40 MG tablet TAKE ONE TABLET BY MOUTH EVERY NIGHT AT BEDTIME 02/17/15  Yes Marjan Rabbani, MD  magnesium oxide (MAG-OX) 400 (241.3 MG) MG tablet Take 1 tablet (400 mg total) by mouth daily. 10/15/14  Yes Shela Leff, MD  Multiple Vitamin (MULTIVITAMIN WITH MINERALS) TABS tablet Take 1 tablet by mouth daily. 11/19/14  Yes Iline Oven, MD  pantoprazole (PROTONIX) 40 MG tablet Take 1 tablet (40 mg total) by mouth daily. 12/11/14  Yes Bartholomew Crews, MD  Vitamin D, Ergocalciferol, (DRISDOL) 50000 units CAPS capsule Take 1 capsule (50,000 Units total) by mouth every 7 (seven) days. 02/26/15  Yes Annia Belt, MD  acetaminophen (TYLENOL) 500 MG tablet Take 1 tablet (500 mg total) by mouth every 6 (six) hours as needed. Patient not taking: Reported on 12/24/2014 11/25/14 11/25/15  Norval Gable, MD   BP 136/96 mmHg  Pulse 100  Temp(Src) 97.7 F (36.5 C)  (Oral)  Resp 18  SpO2 99% Physical Exam  Constitutional: He appears well-developed and well-nourished. No distress.  HENT:  Head: Normocephalic and atraumatic.  Right Ear: Tympanic membrane and ear canal normal.  Left Ear: Tympanic membrane and ear canal normal.  Nose: Nose normal.  Mouth/Throat: Uvula is midline, oropharynx is clear and moist and mucous membranes are normal.  Eyes: Pupils are equal, round, and reactive to light.  Neck: Normal range of motion. Neck supple.  Cardiovascular: Normal rate and regular rhythm.   Pulmonary/Chest: Effort normal.  Abdominal: Soft. Bowel sounds are normal. He exhibits no distension. There is tenderness (mild) in the epigastric area. There is no rigidity, no rebound and no guarding.  No signs of abdominal distention  Musculoskeletal:  No LE swelling  Neurological: He is alert.  Acting at baseline  Skin: Skin is warm and dry. No rash noted.  Psychiatric: His mood appears not anxious. His affect is not angry. His speech is not rapid and/or pressured. He is not agitated, not aggressive and not hyperactive. He does not exhibit a depressed mood. He expresses no homicidal and no suicidal ideation.  Nursing note and vitals reviewed.   ED Course  Procedures (including critical care time) Labs Review Labs Reviewed  COMPREHENSIVE METABOLIC PANEL - Abnormal; Notable for the following:    Glucose, Bld 113 (*)    Calcium 8.7 (*)    All other components within normal limits  ETHANOL - Abnormal; Notable for the following:    Alcohol, Ethyl (B) 255 (*)    All other components within normal limits  CBC WITH DIFFERENTIAL/PLATELET  LIPASE, BLOOD  URINE RAPID DRUG SCREEN, HOSP PERFORMED  CBG MONITORING, ED    Imaging Review No results found. I have personally reviewed and evaluated these images and lab results as part of my medical decision-making.   EKG Interpretation None      MDM   Final diagnoses:  Gastritis    The patient's alcohol  level came back at 255, I confronted the patient about this and he says he may have had more than 1 or 2 beers. His CBC, CMP, UDS and lipase are all unremarkable. His symptoms are consistent with alcoholic gastritis, he denies having any hematemesis or signs of bleeding. He was given a GI cocktail and Protonix which did improve his symptoms some. He requested Percocet but I do not feel comfortable with this while he is intoxicated.  Will refill is Protonix and Carafate. Referral to GI. Patients pain improved with interventions and he has had no vomiting and is overall well appearing. Pt education about drinking and gastritis/pancreatitis discussed.   Filed Vitals:   03/08/15 0155 03/08/15 0459  BP: 146/93 136/96  Pulse: 88 100  Temp: 97.7 F (36.5 C)   Resp: [address] PA-C 03/08/15 7262  Rolland Porter, MD 03/08/15 [phone]

## 2015-03-08 NOTE — Discharge Instructions (Signed)

## 2015-03-10 ENCOUNTER — Telehealth: Payer: Self-pay | Admitting: Dietician

## 2015-03-10 ENCOUNTER — Telehealth: Payer: Self-pay | Admitting: Internal Medicine

## 2015-03-10 NOTE — Addendum Note (Signed)
Addended by: Orson Gear on: 03/10/2015 11:26 AM   Modules accepted: Orders

## 2015-03-10 NOTE — Telephone Encounter (Signed)
Patient requested new prescription for his testing supplies because he is changing pharmacies. Diabetes Educator told him has refills and asked  him to contact his pharmacy and ask them to transfer the prescriptions for him.

## 2015-03-10 NOTE — Telephone Encounter (Signed)
   Reason for call:   I received a call from [name] at 711  PM reporting foot/hip pain.Marland Kitchen   Pertinent Data:   He is inquiring about Percocet for his foot pain as he feels gabapentin and NSAIDs are not alleviating his symptoms.  He is on a medication assistance program which would help him purchase what he is currently on.    Assessment / Plan / Recommendations:   I advised him to follow-up with his PCP, Dr. Naaman Plummer, on Friday to address his concerns to which he acknowledged understanding.  As always, pt is advised that if symptoms worsen or new symptoms arise, they should go to an urgent care facility or to to ER for further evaluation.   Riccardo Dubin, MD   03/10/2015, 7:31 PM

## 2015-03-12 ENCOUNTER — Other Ambulatory Visit: Payer: Self-pay | Admitting: Internal Medicine

## 2015-03-12 ENCOUNTER — Telehealth: Payer: Self-pay | Admitting: *Deleted

## 2015-03-12 ENCOUNTER — Encounter: Payer: Self-pay | Admitting: Internal Medicine

## 2015-03-12 ENCOUNTER — Ambulatory Visit (INDEPENDENT_AMBULATORY_CARE_PROVIDER_SITE_OTHER): Payer: Medicare Other | Admitting: Internal Medicine

## 2015-03-12 VITALS — BP 132/87 | HR 102 | Temp 98.7°F | Ht 64.0 in | Wt 143.6 lb

## 2015-03-12 DIAGNOSIS — Z0000 Encounter for general adult medical examination without abnormal findings: Secondary | ICD-10-CM

## 2015-03-12 DIAGNOSIS — E559 Vitamin D deficiency, unspecified: Secondary | ICD-10-CM | POA: Insufficient documentation

## 2015-03-12 DIAGNOSIS — E1142 Type 2 diabetes mellitus with diabetic polyneuropathy: Secondary | ICD-10-CM

## 2015-03-12 DIAGNOSIS — E1165 Type 2 diabetes mellitus with hyperglycemia: Secondary | ICD-10-CM

## 2015-03-12 DIAGNOSIS — M25562 Pain in left knee: Secondary | ICD-10-CM | POA: Diagnosis not present

## 2015-03-12 DIAGNOSIS — Z7982 Long term (current) use of aspirin: Secondary | ICD-10-CM

## 2015-03-12 DIAGNOSIS — G8929 Other chronic pain: Secondary | ICD-10-CM

## 2015-03-12 DIAGNOSIS — Z794 Long term (current) use of insulin: Secondary | ICD-10-CM | POA: Diagnosis not present

## 2015-03-12 DIAGNOSIS — M25561 Pain in right knee: Secondary | ICD-10-CM | POA: Diagnosis not present

## 2015-03-12 DIAGNOSIS — M159 Polyosteoarthritis, unspecified: Secondary | ICD-10-CM | POA: Insufficient documentation

## 2015-03-12 LAB — GLUCOSE, CAPILLARY: GLUCOSE-CAPILLARY: 142 mg/dL — AB (ref 65–99)

## 2015-03-12 LAB — POCT GLYCOSYLATED HEMOGLOBIN (HGB A1C): Hemoglobin A1C: 7.7

## 2015-03-12 MED ORDER — ACCU-CHEK SOFTCLIX LANCET DEV MISC: Status: DC

## 2015-03-12 MED ORDER — DICLOFENAC SODIUM 1 % TD GEL: 2.0000 g | Freq: Four times a day (QID) | TRANSDERMAL | Status: DC

## 2015-03-12 MED ORDER — KETOROLAC TROMETHAMINE 60 MG/2ML IM SOLN
60.0000 mg | Freq: Once | INTRAMUSCULAR | Status: AC
  Administered 2015-03-12: 60 mg via INTRAMUSCULAR

## 2015-03-12 NOTE — Patient Instructions (Signed)
-  Great job on improving your diabetes - your A1c is come down to 7.7 from 13.8, please bring your meter in next time -Start applying voltaren gel 4 times daily to your painful joints -Will refer you to the pain clinic since you are in a lot of pain, they will call you to set up an appointment -Will give you toradol today for your pain -Will check you cholesterol and urine for protein next time -Very nice seeing you, please come back in 3 months   General Instructions:   Thank you for bringing your medicines today. This helps [place] keep you safe from mistakes.   Progress Toward Treatment Goals:  Treatment Goal 12/24/2014  Hemoglobin A1C unable to assess  Blood pressure -  Stop smoking smoking the same amount  Prevent falls unchanged    Self Care Goals & Plans:  Self Care Goal 03/12/2015  Manage my medications -  Monitor my health keep track of my blood glucose; bring my glucose meter and log to each visit  Eat healthy foods drink diet soda or water instead of juice or soda; eat more vegetables; eat foods that are low in salt; eat baked foods instead of fried foods; eat fruit for snacks and desserts  Be physically active -  Stop smoking go to the [employer] ([email or web address].info/)  Meeting treatment goals maintain the current self-care plan    Home Blood Glucose Monitoring 12/24/2014  Check my blood sugar 3 times a day  When to check my blood sugar before breakfast; before lunch; before dinner     Care Management & Community Referrals:  Referral 03/12/2015  Referrals made to community resources smoking cessation

## 2015-03-12 NOTE — Telephone Encounter (Signed)
Pt has recently gotten [facility] called them today for a script they rec'd from pt's visit today also we rec'd a request to fill strips from [facility], that script request was refused

## 2015-03-12 NOTE — Progress Notes (Signed)
Patient ID: Michael [name], male   DOB: 03-11-1955, 60 y.o.   MRN: [id number]    Subjective:   Patient ID: Michael [name] male   DOB: February 03, 1955 60 y.o.   MRN: [id number]  HPI: [name] is a 60 y.o. 60 y.o. man with past medical history of Type 2 DM, hypertension, hyperlipidemia, chronic alcoholic pancreatitis, alcohol withdrawal seizures, tobacco use, depression, vitamin D deficiency, and GERD who presents with chief complaint of pain in multiple joints and follow-up of diabetes.   He has presumed osteoarthritis and possible CPPD with imaging on 02/01/15 of his bilateral knees showing chondrocalcinosis and degenerative changes. He reports pain in his knees, shoulders, feet, and and legs with no recent injury, fall, or trauma that keeps him up at night and unrelieved with gabapentin 600 AM and 900 PM. He has tried tylenol, NSAID's, and tramadol in the past with no relief. He used to be on percocet as part of a pain contract with our clinic but no longer since 2013 after losing a prescription which he reports was not his fault. He states that percocet adequately controlled his pain and would like to try this again. He denies drug use and UDS was negative on 03/08/15 but reports having to drink alcohol a few days ago with ethanol level of 255 on 03/07/14 due to severe pain that kept him from sleeping. He also has chronic abdominal pain from chronic pancreatitis and is compliant with taking creon.   His last A1c was 13.8 on 12/04/14. He is compliant with taking levemir 18 U daily. He checks his blood sugar three times daily with range of 100-200's. He unfortunately did not bring his meter in today. He denies symptomatic hypoglycemia, blurry vision, polydipsia, polyuria, polyphagia, and foot injury/ulceration but does have chronic peripheral neuropathy uncontrolled on gabapentin. He has been trying to follow a healthy diet and exercise. His weight has been stable since 5 months ago.   He is compliant  with taking lovastatin for hyperlipidemia with no muscle weakness or cramping.    Past Medical History  Diagnosis Date  . Pancreatitis   . Diabetes mellitus without complication ([place])   . Seizures ([place])    Current Outpatient Prescriptions  Medication Sig Dispense Refill  . ACCU-CHEK FASTCLIX LANCETS MISC Use to check your blood sugar 3 times a day 102 each 5  . acetaminophen (TYLENOL) 500 MG tablet Take 1 tablet (500 mg total) by mouth every 6 (six) hours as needed. (Patient not taking: Reported on 12/24/2014) 100 tablet 2  . ARIPiprazole (ABILIFY) 2 MG tablet Take 2 mg by mouth daily.    . ARIPiprazole (ABILIFY) 20 MG tablet Take 20 mg by mouth daily.    Michael [name] aspirin EC 81 MG tablet Take 1 tablet (81 mg total) by mouth at bedtime. 90 tablet 0  . Blood Glucose Monitoring Suppl (ACCU-CHEK AVIVA PLUS) W/DEVICE KIT Check blood sugar up to 3 times a day 1 kit 1  . feeding supplement, GLUCERNA SHAKE, (GLUCERNA SHAKE) LIQD Take 237 mLs by mouth 3 (three) times daily between meals. 90 Can 6  . FLUoxetine (PROZAC) 20 MG capsule Take 20 mg by mouth 2 (two) times daily.     . folic acid (FOLVITE) 1 MG tablet TAKE 1 TABLET BY MOUTH DAILY  3  . gabapentin (NEURONTIN) 300 MG capsule Take 2 capsules (600 mg) by mouth every morning and afternoon.  Take 3 capsules (900 mg) at night before bed. 210 capsule 1  . glucose  blood (ACCU-CHEK AVIVA PLUS) test strip Use to check blood sugar 3 to 4 times daily. diag code E11.9. Insulin dependent 100 each 11  . Insulin Detemir (LEVEMIR FLEXTOUCH) 100 UNIT/ML Pen Inject 18 units daily before bedtime 18 mL 3  . Insulin Pen Needle 31G X 5 MM MISC Use to inject 15 units of Levemir one time a day 100 each 5  . Insulin Syringe-Needle U-100 (B-D INSULIN SYRINGE) 31G X 5/16" 0.3 ML MISC Use to inject insulin into the skin at bedtime each day. diag code E11.40. Insulin dependent 90 each 0  . Lancet Devices (ACCU-CHEK SOFTCLIX) lancets Use to check your blood sugar 3 times a day.  Dx code E11.65. Insulin dependent. 102 each 5  . lipase/protease/amylase (CREON) 36000 UNITS CPEP capsule Take 1 capsule (36,000 Units total) by mouth 3 (three) times daily. 90 capsule 5  . lovastatin (MEVACOR) 40 MG tablet TAKE ONE TABLET BY MOUTH EVERY NIGHT AT BEDTIME 30 tablet 5  . magnesium oxide (MAG-OX) 400 (241.3 MG) MG tablet Take 1 tablet (400 mg total) by mouth daily. 30 tablet 3  . Multiple Vitamin (MULTIVITAMIN WITH MINERALS) TABS tablet Take 1 tablet by mouth daily.    . pantoprazole (PROTONIX) 20 MG tablet Take 1 tablet (20 mg total) by mouth daily. 30 tablet 0  . pantoprazole (PROTONIX) 40 MG tablet Take 1 tablet (40 mg total) by mouth daily. 90 tablet 0  . sucralfate (CARAFATE) 1 GM/10ML suspension Take 10 mLs (1 g total) by mouth 4 (four) times daily -  with meals and at bedtime. 420 mL 0  . Vitamin D, Ergocalciferol, (DRISDOL) 50000 units CAPS capsule Take 1 capsule (50,000 Units total) by mouth every 7 (seven) days. 7 capsule 6   No current facility-administered medications for this visit.   Family History  Problem Relation Age of Onset  . Colon cancer Neg Hx   . Stomach cancer Neg Hx   . Anesthesia problems Neg Hx   . Hypotension Neg Hx   . Pseudochol deficiency Neg Hx   . Malignant hyperthermia Neg Hx   . Hypertension Sister   . Diabetes Sister   . Heart disease Sister    Social History   Social History  . Marital Status: Single    Spouse Name: N/A  . Number of Children: 0  . Years of Education: 11   Occupational History  .    [employer]    worked for 6 years  . 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 Smoking status: Current Every Day Smoker -- 0.60 packs/day for 40 years    Types: Cigarettes  . Smokeless tobacco: Never Used     Comment: 4-6 CIGARETTES  . Alcohol Use: 6.0 oz/week    10 Standard drinks or equivalent per week     Comment: 11/17/2014 "was drinking 2-5 drinks; stopped ~  4-5 months"  . Drug Use: Yes    Special: Marijuana     Comment: 11/17/2014 "maybe once/month"  . Sexual Activity: Yes   Other Topics Concern  . None   Social History Narrative   Has been living at 3 different friend's homes since discharge 01/2011. Was living in the shelter prior to admission. Had previously lived with his niece and other family members but he has gotten kicked out each time.    Review of Systems:  Review of Systems  Constitutional: Negative for fever  and weight loss.       Feeling cold  Eyes: Negative for blurred vision.  Respiratory: Negative for cough, shortness of breath and wheezing.   Cardiovascular: Negative for chest pain and leg swelling.  Gastrointestinal: Positive for abdominal pain (chronic ) and diarrhea. Negative for nausea, vomiting and constipation.  Musculoskeletal: Positive for joint pain (knees, shoulders, feet). Negative for myalgias and falls.  Neurological: Positive for dizziness (lightheaded). Negative for headaches.  Endo/Heme/Allergies: Negative for polydipsia.  Psychiatric/Behavioral: Positive for substance abuse (recent alcohol use). The patient has insomnia (due to pain).     Objective:  Physical Exam: Filed Vitals:   03/12/15 1111  BP: 132/87  Pulse: 102  Temp: 98.7 F (37.1 C)  TempSrc: Oral  Height: 5' 4" (1.626 m)  Weight: 143 lb 9.6 oz (65.137 kg)  SpO2: 100%   Physical Exam  Constitutional: He is oriented to person, place, and time. No distress.  Thin-appearing  HENT:  Head: Normocephalic and atraumatic.  Right Ear: External ear normal.  Left Ear: External ear normal.  Nose: Nose normal.  Mouth/Throat: Oropharynx is clear and moist. No oropharyngeal exudate.  Poor dentition  Eyes: Conjunctivae and EOM are normal. Pupils are equal, round, and reactive to light. Right eye exhibits no discharge.  Neck: Normal range of motion. Neck supple.  Cardiovascular: Normal rate, regular rhythm and normal heart sounds.     Pulmonary/Chest: Effort normal and breath sounds normal. No respiratory distress. He has no wheezes. He has no rales.  Abdominal: Soft. Bowel sounds are normal. He exhibits no distension. There is tenderness (epigastric, chronic). There is no rebound and no guarding.  Musculoskeletal: Normal range of motion. He exhibits tenderness (b/l knees ). He exhibits no edema.  No inflammation or effusion of joints   Neurological: He is alert and oriented to person, place, and time.  Skin: Skin is warm and dry. No rash noted. He is not diaphoretic. No erythema. No pallor.  Psychiatric: He has a normal mood and affect. His behavior is normal. Judgment and thought content normal.  Speaking fast at times    Assessment & Plan:   Please see problem list for problem-based assessment and plan

## 2015-03-14 ENCOUNTER — Telehealth: Payer: Self-pay | Admitting: Internal Medicine

## 2015-03-14 NOTE — Assessment & Plan Note (Signed)
Assessment: Pt with chronic peripheral neuropathy, multiple joint pains worse in knees with radiographic evidence of OA and CPPD, as well as chronic abdominal pain in setting of chronic pancreatitis who presents with uncontrolled pain on current medical therapy.    Plan:  -Pt given IM toradol 60 mg in clinic  -Prescribe voltaren 1% gel 2 g to apply QID to joints  -Continue gabapentin 600 AM and 900 PM -If pain still uncontrolled consider lidocaine or capsaicin patch and/or duloxetine  -Refer to pain clinic for further management including consideration of narcotic therapy

## 2015-03-14 NOTE — Assessment & Plan Note (Addendum)
Assessment: Pt with last A1c 13.8 on 12/04/14 compliant with insulin therapy with no symptomatic hypoglycemia who presents with CBG of 142 and significantly improved A1c of 7.7.   Plan: -A1c 7.7 not at goal <7, continue levemir 18 U daily, pt instructed to bring glucose meter at next visit for further adjustment. Metformin contraindicated in setting of alcohol abuse history.  -BP 132/87 at goal <140/90 not on anti-hypertensive therapy -LDL 78 at goal <100, continue lovastatin 40 mg daily, pt declined annual lipid panel testing until next visit -Continue gabapentin 600 mg AM and 900 mg PM for chronic peripheral neuropathy  -Last annual eye exam on 01/28/15 with no retinopathy  -Last annual foot exam on 12/14/14   -Last annual urine microalbumin test on 01/19/14 with no proteinuria, pt declined repeat testing until next visit   -BMI 24.64 at goal <25, continue lifestyle modification  -Continue aspirin 81 mg daily for primary CVD prevention

## 2015-03-14 NOTE — Assessment & Plan Note (Signed)
-  Obtain repeat 25-OH vitamin D level at next visit -Obtain hepatitis B serologies at next visit in setting of recent acute infection per labs drawn in September 2016

## 2015-03-14 NOTE — Telephone Encounter (Signed)
Yes he had requested the strips to be sent to [facility] at his clinic visit with me on Friday.   Dr. Naaman Plummer

## 2015-03-14 NOTE — Telephone Encounter (Signed)
   Reason for call:   I received a call from Mr. [name] at 648  PM asking about his medication refills..   Pertinent Data:   It was difficult for me to understand what he was saying given the rapid pace of his speech, but he was confused about his prescriptions not being faxed over as he is now getting them from a new agency.   Assessment / Plan / Recommendations:   I acknowledged that while it may be frustrating that his medications are not yet delivered, he should call during normal business hours during which time his PCP or a clinic staff member can help.  As always, pt is advised that if symptoms worsen or new symptoms arise, they should go to an urgent care facility or to to ER for further evaluation.   Riccardo Dubin, MD   03/14/2015, 6:59 PM

## 2015-03-15 ENCOUNTER — Telehealth: Payer: Self-pay | Admitting: Internal Medicine

## 2015-03-15 ENCOUNTER — Telehealth: Payer: Self-pay

## 2015-03-15 NOTE — Telephone Encounter (Signed)
i have spoken to dr Naaman Plummer about this pt calling constantly to change pharmacies and for refills on test strips, we have had 3+ calls today, she has agreed to call pt and speak to him on this subject, if this continues we will speak w/ dr Software engineer.

## 2015-03-15 NOTE — Telephone Encounter (Signed)
  INTERNAL MEDICINE RESIDENCY PROGRAM After-Hours Telephone Call    Reason for call:   I received a call from [name] at 6:20 PM, 03/15/2015. Patient did not answer my return phone call.     Pertinent Data:       Assessment / Plan / Recommendations:   A message was left to return our phone call if assistance was needed.     Osa Craver, DO PGY-2 Internal Medicine Resident Pager # [phone] 03/15/2015 6:20 PM

## 2015-03-15 NOTE — Telephone Encounter (Signed)
Please call [phone] to let them know Michael [name]'s doctor is Rabbani, so they will release his medications

## 2015-03-15 NOTE — Telephone Encounter (Signed)
Pt called stating a new pharmacy was trying to get all of his rx refilled and sent in and they had been waiting on [place] since the 15th.  He states this is a mail order but cannot tell me the name.  [facility] delivers to patient.  I advised pt since he cannot give me the name of the company he would need to contact the mail order company and refer them to [place] or to [facility] since he has current active rx that could be transferred between pharmacies.  I have checked our fax system and see no requests pending.  Pt agreeable.

## 2015-03-15 NOTE — Telephone Encounter (Signed)
Pt requesting the nurse to call back regarding meds.  

## 2015-03-15 NOTE — Progress Notes (Signed)
Internal Medicine Clinic Attending  Case discussed with Dr. Rabbani soon after the resident saw the patient.  We reviewed the resident's history and exam and pertinent patient test results.  I agree with the assessment, diagnosis, and plan of care documented in the resident's note.  

## 2015-03-16 ENCOUNTER — Other Ambulatory Visit: Payer: Self-pay | Admitting: *Deleted

## 2015-03-16 DIAGNOSIS — Z760 Encounter for issue of repeat prescription: Secondary | ICD-10-CM

## 2015-03-16 DIAGNOSIS — K860 Alcohol-induced chronic pancreatitis: Secondary | ICD-10-CM

## 2015-03-16 DIAGNOSIS — E0842 Diabetes mellitus due to underlying condition with diabetic polyneuropathy: Secondary | ICD-10-CM

## 2015-03-16 DIAGNOSIS — G8929 Other chronic pain: Secondary | ICD-10-CM

## 2015-03-16 DIAGNOSIS — Z794 Long term (current) use of insulin: Secondary | ICD-10-CM

## 2015-03-16 NOTE — Telephone Encounter (Signed)
[name] placed call to CSW this morning.  Pt states he has switched to [facility] for home delivery of medications.  [name] voiced concern that [facility] "will cancel everything and I will have to start all over if they don't get a call from the nurse.  I don't think she ever gave the message to the nurse."  CSW reassured [name] pt's message was delivered to nursing staff and action has been made.  CSW discussed above with RN.  CSW available to provide follow up to [name] as needed.

## 2015-03-16 NOTE — Telephone Encounter (Signed)
Pt changing pharm due to insurance

## 2015-03-16 NOTE — Telephone Encounter (Signed)
Pt has just gotten [facility]s [facility]

## 2015-03-16 NOTE — Telephone Encounter (Signed)
Mr. Heady called checking on the status of this request.  I informed him [facility] would have his scripts very soon.  He is still stating they sent them and have been waiting since 2/15, he is persistent that we need to do this for him and not understanding my explanation of the situation.  He is demanding Dr. Naaman Plummer call him even once the scripts have been signed and sent.

## 2015-03-17 ENCOUNTER — Telehealth: Payer: Self-pay | Admitting: Internal Medicine

## 2015-03-17 ENCOUNTER — Other Ambulatory Visit: Payer: Self-pay

## 2015-03-17 DIAGNOSIS — E0810 Diabetes mellitus due to underlying condition with ketoacidosis without coma: Secondary | ICD-10-CM

## 2015-03-17 DIAGNOSIS — E1110 Type 2 diabetes mellitus with ketoacidosis without coma: Secondary | ICD-10-CM

## 2015-03-17 DIAGNOSIS — K860 Alcohol-induced chronic pancreatitis: Secondary | ICD-10-CM

## 2015-03-17 DIAGNOSIS — Z794 Long term (current) use of insulin: Secondary | ICD-10-CM

## 2015-03-17 MED ORDER — ADULT MULTIVITAMIN W/MINERALS CH: 1.0000 | ORAL_TABLET | Freq: Every day | ORAL | Status: DC

## 2015-03-17 MED ORDER — PANCRELIPASE (LIP-PROT-AMYL) 36000-114000 UNITS PO CPEP: 36000.0000 [IU] | ORAL_CAPSULE | Freq: Three times a day (TID) | ORAL | Status: DC

## 2015-03-17 MED ORDER — LOVASTATIN 40 MG PO TABS: 40.0000 mg | ORAL_TABLET | Freq: Every day | ORAL | Status: DC

## 2015-03-17 MED ORDER — FOLIC ACID 1 MG PO TABS: 1.0000 mg | ORAL_TABLET | Freq: Every day | ORAL | Status: DC

## 2015-03-17 MED ORDER — GLUCOSE BLOOD VI STRP: ORAL_STRIP | Status: DC

## 2015-03-17 MED ORDER — GABAPENTIN 300 MG PO CAPS: ORAL_CAPSULE | ORAL | Status: DC

## 2015-03-17 MED ORDER — "INSULIN SYRINGE-NEEDLE U-100 31G X 5/16"" 0.3 ML MISC": Status: DC

## 2015-03-17 MED ORDER — GLUCERNA SHAKE PO LIQD: 1.0000 | Freq: Three times a day (TID) | ORAL | Status: DC

## 2015-03-17 MED ORDER — DICLOFENAC SODIUM 1 % TD GEL: 2.0000 g | Freq: Four times a day (QID) | TRANSDERMAL | Status: DC

## 2015-03-17 MED ORDER — PANTOPRAZOLE SODIUM 40 MG PO TBEC: 40.0000 mg | DELAYED_RELEASE_TABLET | Freq: Every day | ORAL | Status: DC

## 2015-03-17 MED ORDER — INSULIN DETEMIR 100 UNIT/ML FLEXPEN: PEN_INJECTOR | SUBCUTANEOUS | Status: DC

## 2015-03-17 MED ORDER — MAGNESIUM OXIDE 400 (241.3 MG) MG PO TABS: 400.0000 mg | ORAL_TABLET | Freq: Every day | ORAL | Status: DC

## 2015-03-17 MED ORDER — ASPIRIN EC 81 MG PO TBEC: 81.0000 mg | DELAYED_RELEASE_TABLET | Freq: Every day | ORAL | Status: DC

## 2015-03-17 MED ORDER — ACCU-CHEK FASTCLIX LANCETS MISC: Status: DC

## 2015-03-17 MED ORDER — INSULIN PEN NEEDLE 31G X 5 MM MISC: Status: DC

## 2015-03-17 NOTE — Telephone Encounter (Signed)
Mr. Savin called- i advised rx [facility] been [facility]

## 2015-03-17 NOTE — Telephone Encounter (Signed)
Yes just filled it this morning, thanks!  Dr. Naaman Plummer

## 2015-03-17 NOTE — Telephone Encounter (Signed)
Called [facility] to verify that they rec'd all of the refills we have sent.  Pharmacist confirmed receipt, states pt asked them to leave them on file and not deliver right now.

## 2015-03-17 NOTE — Telephone Encounter (Signed)
Called pt back on [phone]- no answer.  Unsure of need because we have already spoken and his meds have been [facility].

## 2015-03-17 NOTE — Telephone Encounter (Signed)
These need [facility]

## 2015-03-17 NOTE — Telephone Encounter (Signed)
Have spoken with patient.  He is updated with status of his refill request.

## 2015-03-17 NOTE — Telephone Encounter (Signed)
Received a page from Michael [name] at 7 am this morning (03/17/2015). He is upset and states that he has paperwork which needs to be completed to obtain his prescription medications. Will forward to his PCP.

## 2015-03-17 NOTE — Telephone Encounter (Signed)
Pt requesting the nurse to call back regarding meds. Please call pt back.

## 2015-03-19 ENCOUNTER — Ambulatory Visit (INDEPENDENT_AMBULATORY_CARE_PROVIDER_SITE_OTHER): Payer: Medicare Other | Admitting: Podiatry

## 2015-03-19 ENCOUNTER — Telehealth: Payer: Self-pay | Admitting: Internal Medicine

## 2015-03-19 ENCOUNTER — Other Ambulatory Visit: Payer: Self-pay | Admitting: Internal Medicine

## 2015-03-19 ENCOUNTER — Telehealth: Payer: Self-pay | Admitting: *Deleted

## 2015-03-19 ENCOUNTER — Encounter: Payer: Self-pay | Admitting: Podiatry

## 2015-03-19 DIAGNOSIS — E1149 Type 2 diabetes mellitus with other diabetic neurological complication: Secondary | ICD-10-CM

## 2015-03-19 DIAGNOSIS — M79676 Pain in unspecified toe(s): Secondary | ICD-10-CM

## 2015-03-19 DIAGNOSIS — B351 Tinea unguium: Secondary | ICD-10-CM

## 2015-03-19 MED ORDER — HYDROCODONE-ACETAMINOPHEN 10-325 MG PO TABS: 1.0000 | ORAL_TABLET | Freq: Four times a day (QID) | ORAL | Status: DC | PRN

## 2015-03-19 NOTE — Telephone Encounter (Signed)
Triage has now called [facility] and spoken w/ a pharmacist, the pharmacist states they will be able to fill his diab supplies but it is too early at this time, their records show also that script was filled 2/11 for 100 strips, they have a new script on file for him and when it is time to refill they will do so

## 2015-03-19 NOTE — Telephone Encounter (Signed)
Triage has called Michael [name]'s and verified last date test strips were filled, 2/11 for 100. Spoke w/ [facility] this week and will send them a new script between 3/1 and 3/4 for test strips

## 2015-03-19 NOTE — Progress Notes (Addendum)
Patient ID: Michael [name], male   DOB: 07-Mar-1955, 60 y.o.   MRN: [id number] Complaint:  Visit Type: Patient returns to my office for continued preventative foot care services. Complaint: Patient states" my nails have grown long and thick and become painful to walk and wear shoes" Patient has been diagnosed with DM with neuropathy. The patient presents for preventative foot care services. No changes to ROS  Podiatric Exam: Vascular: dorsalis pedis and posterior tibial pulses are palpable bilateral. Capillary return is immediate. Temperature gradient is WNL. Skin turgor WNL  Sensorium: Decreased  Semmes Weinstein monofilament test. Normal tactile sensation bilaterally. Nail Exam: Pt has thick disfigured discolored nails with subungual debris noted bilateral entire nail hallux through fifth toenails Ulcer Exam: There is no evidence of ulcer or pre-ulcerative changes or infection. Orthopedic Exam: Muscle tone and strength are WNL. No limitations in general ROM. No crepitus or effusions noted. Foot type and digits show no abnormalities. Bony prominences are unremarkable. Skin: No Porokeratosis. No infection or ulcers.  Plantar skin xerosis.  Diagnosis:  Onychomycosis, , Pain in right toe, pain in left toes  Treatment & Plan Procedures and Treatment: Consent by patient was obtained for treatment procedures. The patient understood the discussion of treatment and procedures well. All questions were answered thoroughly reviewed. Debridement of mycotic and hypertrophic toenails, 1 through 5 bilateral and clearing of subungual debris. No ulceration, no infection noted. Patient is concerned about his neuropathy both  feet.  Patient is diabetic and alcohol history. Prescribed vicodin # 10 until he gets an appointment with medical doctor. Return Visit-Office Procedure: Patient instructed to return to the office for a follow up visit 3 months for continued evaluation and treatment.    Gardiner Barefoot DPM

## 2015-03-19 NOTE — Addendum Note (Signed)
Addended by: Ezzard Flax, Nashika Coker L on: 03/19/2015 10:03 AM   Modules accepted: Orders, Medications

## 2015-03-19 NOTE — Telephone Encounter (Signed)
Pt requesting diabetic supplies be sent to [facility]. Phone number [phone].

## 2015-03-19 NOTE — Addendum Note (Signed)
Addended by: Ezzard Flax, Teoman Giraud L on: 03/19/2015 09:56 AM   Modules accepted: Orders

## 2015-03-19 NOTE — Telephone Encounter (Signed)
   Reason for call:   I received a call from Mr. [name] at 8:30  AM indicating that he would like to leave a message for the triage RN requesting rx for Accu-Chek test strips and rx for pen needles be sent to [facility].   Pertinent Data:   Several refills (including above supplies) were sent to [facility] two days ago.    He would like the above to be sent to [facility] instead.  The number is [phone].  He does not have the fax number.   Assessment / Plan / Recommendations:   I will ask triage RN to send in above rx to [facility] per his requests and to call the patient if there is a problem.   Per patient, all other diabetes rx and insulin can continue through [facility] for now.   Francesca Oman, DO   03/19/2015, 8:32 AM

## 2015-03-23 ENCOUNTER — Ambulatory Visit: Payer: Medicare Other | Admitting: Internal Medicine

## 2015-03-25 ENCOUNTER — Other Ambulatory Visit: Payer: Self-pay | Admitting: Internal Medicine

## 2015-03-25 NOTE — Telephone Encounter (Signed)
Pt told me he just wanted to schedule an appt with dr Naaman Plummer, appt was scheduled for 4/7

## 2015-03-25 NOTE — Telephone Encounter (Signed)
Pt requesting test strips to be filled. Please call pt back.

## 2015-03-31 ENCOUNTER — Telehealth: Payer: Self-pay | Admitting: Licensed Clinical Social Worker

## 2015-03-31 NOTE — Telephone Encounter (Signed)
CSW received voice mail from [name].  Pt states he has been linked with new pain clinic in [place], [place].  [place].  Pt called again to notify CSW his medication costs were $781.

## 2015-04-01 DIAGNOSIS — G894 Chronic pain syndrome: Secondary | ICD-10-CM | POA: Diagnosis not present

## 2015-04-01 DIAGNOSIS — M2550 Pain in unspecified joint: Secondary | ICD-10-CM | POA: Diagnosis not present

## 2015-04-01 DIAGNOSIS — E1142 Type 2 diabetes mellitus with diabetic polyneuropathy: Secondary | ICD-10-CM | POA: Diagnosis not present

## 2015-04-01 DIAGNOSIS — Z79899 Other long term (current) drug therapy: Secondary | ICD-10-CM | POA: Diagnosis not present

## 2015-04-01 DIAGNOSIS — Z5181 Encounter for therapeutic drug level monitoring: Secondary | ICD-10-CM | POA: Diagnosis not present

## 2015-04-02 ENCOUNTER — Other Ambulatory Visit: Payer: Self-pay | Admitting: Internal Medicine

## 2015-04-02 NOTE — Telephone Encounter (Signed)
Last appt 03/12/15. Next appt 04/30/15.

## 2015-04-04 ENCOUNTER — Telehealth: Payer: Self-pay | Admitting: Pulmonary Disease

## 2015-04-04 DIAGNOSIS — E0842 Diabetes mellitus due to underlying condition with diabetic polyneuropathy: Secondary | ICD-10-CM

## 2015-04-04 DIAGNOSIS — Z794 Long term (current) use of insulin: Principal | ICD-10-CM

## 2015-04-04 MED ORDER — GABAPENTIN 300 MG PO CAPS: ORAL_CAPSULE | ORAL | Status: DC

## 2015-04-04 NOTE — Telephone Encounter (Signed)
   Reason for call:   I received a call from [name] at 3:01 PM indicating he is having poorly controlled diabetic neuropathy pain.   Pertinent Data:   Taking gabapentin 2 in the morning, 2 in the afternoon and 3 at night, Voltaren gel four times a day. Ran out of Norco. No over the counter meds. Icy hot helps a little.    Assessment / Plan / Recommendations:   Recommended that he increase his gabapentin to 3 capsules (900 mg) by mouth every morning and afternoon. And icnrease the bedtime dose to 1200mg . Also recommended capsaicin cream over the counter.  Will send message to front desk to offer him a follow up appointment sooner.  As always, pt is advised that if symptoms worsen or new symptoms arise, they should go to an urgent care facility or to to ER for further evaluation.   Milagros Loll, MD   04/04/2015, 3:00 PM

## 2015-04-05 NOTE — Telephone Encounter (Signed)
Called patient today per the request of Dr. Randell Patient,  Patient declined an appt at this time stated he is going to wait to be seen at the pain clinic.  I told him to please call back if he changes his mind.

## 2015-04-08 NOTE — Telephone Encounter (Signed)
Mr. Parziale left voicemail stating he would like to speak with CSW one on one.  Pt requesting call at [phone].  CSW returned call to pt.  CSW left message requesting return call. CSW provided contact hours and phone number.

## 2015-04-15 DIAGNOSIS — M25561 Pain in right knee: Secondary | ICD-10-CM | POA: Diagnosis not present

## 2015-04-15 DIAGNOSIS — M2550 Pain in unspecified joint: Secondary | ICD-10-CM | POA: Diagnosis not present

## 2015-04-15 DIAGNOSIS — G894 Chronic pain syndrome: Secondary | ICD-10-CM | POA: Diagnosis not present

## 2015-04-15 DIAGNOSIS — R1012 Left upper quadrant pain: Secondary | ICD-10-CM | POA: Diagnosis not present

## 2015-04-30 ENCOUNTER — Encounter: Payer: Medicare Other | Admitting: Internal Medicine

## 2015-05-05 ENCOUNTER — Other Ambulatory Visit: Payer: Self-pay | Admitting: Internal Medicine

## 2015-05-05 NOTE — Telephone Encounter (Signed)
Sending to charsetta also for may appt as ask at last appt by dr Naaman Plummer

## 2015-05-13 ENCOUNTER — Telehealth: Payer: Self-pay | Admitting: Licensed Clinical Social Worker

## 2015-05-13 NOTE — Telephone Encounter (Signed)
CSW received voice mail and incoming call from Michael [name].  Pt states he is looking for [facility] HMO/Medicaid eye doctor.  Michael [name] states he has been calling around and has yet to find a provider that accepts both.  CSW offered to utilize the [facility] [employer] and provide listing if available.  Pt requesting listing to be mailed.  CSW utilized [facility] [employer] for ophthalmologist search.  Listing mailed to Michael [name].

## 2015-05-17 ENCOUNTER — Telehealth: Payer: Self-pay | Admitting: *Deleted

## 2015-05-17 NOTE — Telephone Encounter (Signed)
Thanks for letting me know!   Dr. Naaman Plummer

## 2015-05-17 NOTE — Telephone Encounter (Signed)
Pt calls and states he uses 2 cbg machines and needs supplies for both, he states he keeps 1 at home and brings 1 to the doctor and needs supplies for both, he is told he cannot do this and insurance will not pay for 2 machines and supplies, this may have to do with what the pharmacy has stated that pt sells supplies for cbg monitor He is refused refills and he states he will speak to dr Naaman Plummer about this at his appt, he is advised to do so

## 2015-05-18 ENCOUNTER — Telehealth: Payer: Self-pay | Admitting: Dietician

## 2015-05-18 NOTE — Telephone Encounter (Signed)
asking for a prescription for agamatrix wave sense presto strips be sent to [facility]. He says they can give him to strips to his meter that [facility]. When i questioned why he needed a second meter, he said he likes using his old one. He said the rx can be sent to  [address]: [phone] .   I called [facility] and spoke with Sharyn Lull in their pharmacy who says he last got strips from them in November for the [employer]. She is faxing [place] a prescription.

## 2015-05-18 NOTE — Telephone Encounter (Signed)
Faxed prescription from [facility] Willey Blade) put in Dr. Harley Hallmark box

## 2015-05-20 DIAGNOSIS — M25561 Pain in right knee: Secondary | ICD-10-CM | POA: Diagnosis not present

## 2015-05-20 DIAGNOSIS — M25562 Pain in left knee: Secondary | ICD-10-CM | POA: Diagnosis not present

## 2015-06-07 ENCOUNTER — Other Ambulatory Visit: Payer: Self-pay | Admitting: Internal Medicine

## 2015-06-14 ENCOUNTER — Other Ambulatory Visit: Payer: Self-pay | Admitting: Internal Medicine

## 2015-06-14 ENCOUNTER — Telehealth: Payer: Self-pay | Admitting: Dietician

## 2015-06-14 DIAGNOSIS — G894 Chronic pain syndrome: Secondary | ICD-10-CM | POA: Diagnosis not present

## 2015-06-14 DIAGNOSIS — M25562 Pain in left knee: Secondary | ICD-10-CM | POA: Diagnosis not present

## 2015-06-14 DIAGNOSIS — M25561 Pain in right knee: Secondary | ICD-10-CM | POA: Diagnosis not present

## 2015-06-14 NOTE — Telephone Encounter (Signed)
Not an active med since October 2016

## 2015-06-14 NOTE — Telephone Encounter (Signed)
Patient asks about prescription for testing supplies: he says Beverly Sessions never got the prescription for the testing supplies. Told him that he could ask them to call [facility] and transfer that prescription to them so he could get his testing supplies at [facility].

## 2015-06-14 NOTE — Telephone Encounter (Signed)
I do not feel comfortable refilling his testing supplies due to concern for him selling these. He is too frequently asking for refills. Bonnita Nasuti can you check to see when is the next date he needs a refill? Thanks!  Dr. Naaman Plummer

## 2015-06-15 ENCOUNTER — Other Ambulatory Visit: Payer: Self-pay | Admitting: *Deleted

## 2015-06-15 DIAGNOSIS — M170 Bilateral primary osteoarthritis of knee: Secondary | ICD-10-CM | POA: Insufficient documentation

## 2015-06-15 NOTE — Telephone Encounter (Signed)
Called [facility], they sent pt a 3 month supply of test strips Jun 08, 2015 and they were delivered.

## 2015-06-18 ENCOUNTER — Encounter: Payer: Medicare Other | Admitting: Internal Medicine

## 2015-06-19 ENCOUNTER — Other Ambulatory Visit: Payer: Self-pay | Admitting: Pulmonary Disease

## 2015-06-25 ENCOUNTER — Encounter: Payer: Self-pay | Admitting: Podiatry

## 2015-06-25 ENCOUNTER — Ambulatory Visit (INDEPENDENT_AMBULATORY_CARE_PROVIDER_SITE_OTHER): Payer: Medicare Other | Admitting: Podiatry

## 2015-06-25 DIAGNOSIS — B351 Tinea unguium: Secondary | ICD-10-CM | POA: Diagnosis not present

## 2015-06-25 DIAGNOSIS — E1149 Type 2 diabetes mellitus with other diabetic neurological complication: Secondary | ICD-10-CM

## 2015-06-25 DIAGNOSIS — M79676 Pain in unspecified toe(s): Secondary | ICD-10-CM | POA: Diagnosis not present

## 2015-06-25 NOTE — Progress Notes (Signed)
Patient ID: Michael [name], male   DOB: [birth year], 60 y.o.   MRN: [id number] Complaint:  Visit Type: Patient returns to my office for continued preventative foot care services. Complaint: Patient states" my nails have grown long and thick and become painful to walk and wear shoes" Patient has been diagnosed with DM with neuropathy. The patient presents for preventative foot care services. No changes to ROS.  Patient says he still has neuropathy pain.  Podiatric Exam: Vascular: dorsalis pedis and posterior tibial pulses are palpable bilateral. Capillary return is immediate. Temperature gradient is WNL. Skin turgor WNL  Sensorium: Decreased  Semmes Weinstein monofilament test. Normal tactile sensation bilaterally. Nail Exam: Pt has thick disfigured discolored nails with subungual debris noted bilateral entire nail hallux through fifth toenails Ulcer Exam: There is no evidence of ulcer or pre-ulcerative changes or infection. Orthopedic Exam: Muscle tone and strength are WNL. No limitations in general ROM. No crepitus or effusions noted. Foot type and digits show no abnormalities. Bony prominences are unremarkable. Skin: No Porokeratosis. No infection or ulcers.  Plantar skin xerosis.  Diagnosis:  Onychomycosis, , Pain in right toe, pain in left toes  Treatment & Plan Procedures and Treatment: Consent by patient was obtained for treatment procedures. The patient understood the discussion of treatment and procedures well. All questions were answered thoroughly reviewed. Debridement of mycotic and hypertrophic toenails, 1 through 5 bilateral and clearing of subungual debris. No ulceration, no infection noted. . Return Visit-Office Procedure: Patient instructed to return to the office for a follow up visit 3 months for continued evaluation and treatment.    Gardiner Barefoot DPM

## 2015-06-29 ENCOUNTER — Other Ambulatory Visit: Payer: Self-pay | Admitting: Internal Medicine

## 2015-06-30 ENCOUNTER — Other Ambulatory Visit: Payer: Self-pay | Admitting: *Deleted

## 2015-06-30 MED ORDER — PANCRELIPASE (LIP-PROT-AMYL) 36000-114000 UNITS PO CPEP: 36000.0000 [IU] | ORAL_CAPSULE | Freq: Three times a day (TID) | ORAL | Status: DC

## 2015-07-05 ENCOUNTER — Ambulatory Visit (INDEPENDENT_AMBULATORY_CARE_PROVIDER_SITE_OTHER): Payer: Medicare Other | Admitting: Internal Medicine

## 2015-07-05 ENCOUNTER — Encounter: Payer: Self-pay | Admitting: Internal Medicine

## 2015-07-05 VITALS — BP 137/91 | HR 92 | Temp 98.5°F | Ht 64.0 in | Wt 139.8 lb

## 2015-07-05 DIAGNOSIS — E1165 Type 2 diabetes mellitus with hyperglycemia: Secondary | ICD-10-CM

## 2015-07-05 DIAGNOSIS — Z794 Long term (current) use of insulin: Secondary | ICD-10-CM | POA: Diagnosis not present

## 2015-07-05 DIAGNOSIS — E0842 Diabetes mellitus due to underlying condition with diabetic polyneuropathy: Secondary | ICD-10-CM

## 2015-07-05 DIAGNOSIS — E1142 Type 2 diabetes mellitus with diabetic polyneuropathy: Secondary | ICD-10-CM

## 2015-07-05 LAB — GLUCOSE, CAPILLARY: Glucose-Capillary: 307 mg/dL — ABNORMAL HIGH (ref 65–99)

## 2015-07-05 LAB — POCT GLYCOSYLATED HEMOGLOBIN (HGB A1C): HEMOGLOBIN A1C: 9.6

## 2015-07-05 MED ORDER — INSULIN DETEMIR 100 UNIT/ML FLEXPEN: PEN_INJECTOR | SUBCUTANEOUS | Status: DC

## 2015-07-05 NOTE — Patient Instructions (Signed)
1. Please increase your Lantus to 20 units every evening.  Be sure to check your blood sugar at least two times per day.   2. Please take all medications as prescribed.    3. If you have worsening of your symptoms or new symptoms arise, please call the clinic [phone] or go to the ER immediately if symptoms are severe.

## 2015-07-05 NOTE — Assessment & Plan Note (Addendum)
Lab Results  Component Value Date   HGBA1C 9.6 07/05/2015   HGBA1C 7.7 03/12/2015   HGBA1C 13.8* 12/04/2014     Assessment: Diabetes control:  uncontrolled  Progress toward A1C goal:   deteriorated Comments: Logs reviewed.  Avg CBG 365. No lows.  Drinking soda daily.  Also eats cakes, hunny buns.  Eats most meals at home.  Reports missing insulin about twice per week.    Plan: Medications:  continue current medications:  INCREASE insulin from 18 units to 20 units daily.   Home glucose monitoring:  yes Frequency:  TID Timing:   Instruction/counseling given: reminded to bring blood glucose meter & log to each visit and discussed diet Educational resources provided:   Self management tools provided: copy of home glucose meter download Other plans: Discussed dietary changes.  He is in agreement.  Saw DPM last week and had toenails debrided.  Feet are very dry but skin intact. I advised moisturizing and reminded him not to cut his own toenails (he says he does not do this).  He has eye appt coming up this month.  RTC in 1 month for further titration/addition of DM meds.

## 2015-07-05 NOTE — Progress Notes (Signed)
Subjective:    Patient ID: Michael [name], male    DOB: 28-Jun-1955, 60 y.o.   MRN: [id number]  HPI Comments: Michael [name] is a 60 year old man with PMH as below here for follow-up of DM. Please see problem based charting for the status of this condition.    Past Medical History  Diagnosis Date  . Pancreatitis   . Diabetes mellitus without complication ([place])   . Seizures ([place])    Current Outpatient Prescriptions on File Prior to Visit  Medication Sig Dispense Refill  . ACCU-CHEK FASTCLIX LANCETS MISC Use to check your blood sugar 3 times a day 102 each 5  . acetaminophen (TYLENOL) 500 MG tablet Take 1 tablet (500 mg total) by mouth every 6 (six) hours as needed. 100 tablet 2  . ARIPiprazole (ABILIFY) 2 MG tablet Take 2 mg by mouth daily.    . ARIPiprazole (ABILIFY) 20 MG tablet Take 20 mg by mouth daily.    Marland Kitchen aspirin EC 81 MG tablet Take 1 tablet (81 mg total) by mouth at bedtime. 90 tablet 0  . Blood Glucose Monitoring Suppl (ACCU-CHEK AVIVA PLUS) W/DEVICE KIT Check blood sugar up to 3 times a day 1 kit 1  . diclofenac sodium (VOLTAREN) 1 % GEL Apply 2 g topically 4 (four) times daily. 100 g 1  . feeding supplement, GLUCERNA SHAKE, (GLUCERNA SHAKE) LIQD Take 237 mLs by mouth 3 (three) times daily between meals. 90 Can 6  . FLUoxetine (PROZAC) 20 MG capsule Take 20 mg by mouth 2 (two) times daily.     . folic acid (FOLVITE) 1 MG tablet Take 1 tablet by mouth  daily 90 tablet 1  . gabapentin (NEURONTIN) 300 MG capsule TAKE 3 CAPSULES BY MOUTH  EVERY MORNING AND  AFTERNOON. TAKE 4 CAPSULES  BY MOUTH AT NIGHT BEFORE  BED. 630 capsule 0  . glucose blood (ACCU-CHEK AVIVA PLUS) test strip Use to check blood sugar 3 to 4 times daily. diag code E11.9. Insulin dependent 100 each 11  . HYDROcodone-acetaminophen (NORCO) 10-325 MG tablet Take 1 tablet by mouth every 6 (six) hours as needed (4-6 hours as needed). 10 tablet 0  . Insulin Detemir (LEVEMIR FLEXTOUCH) 100 UNIT/ML Pen Inject 18 units daily  before bedtime 18 mL 3  . Insulin Pen Needle 31G X 5 MM MISC Use to inject 15 units of Levemir one time a day 100 each 5  . Insulin Syringe-Needle U-100 (B-D INSULIN SYRINGE) 31G X 5/16" 0.3 ML MISC Use to inject insulin into the skin at bedtime each day. diag code E11.40. Insulin dependent 90 each 0  . Lancet Devices (ACCU-CHEK SOFTCLIX) lancets Use to check your blood sugar 3 times a day. Dx code E11.65. Insulin dependent. 102 each 5  . lipase/protease/amylase (CREON) 36000 UNITS CPEP capsule Take 1 capsule (36,000 Units total) by mouth 3 (three) times daily before meals. 270 capsule 1  . lovastatin (MEVACOR) 40 MG tablet Take 1 tablet by mouth at  bedtime 90 tablet 1  . magnesium oxide (MAG-OX) 400 (241.3 Mg) MG tablet Take 1 tablet (400 mg total) by mouth daily. 90 tablet 0  . Multiple Vitamin (MULTIVITAMIN WITH MINERALS) TABS tablet Take 1 tablet by mouth daily. 90 tablet 0  . pantoprazole (PROTONIX) 40 MG tablet Take 1 tablet by mouth  daily 90 tablet 1  . sucralfate (CARAFATE) 1 GM/10ML suspension Take 10 mLs (1 g total) by mouth 4 (four) times daily -  with meals and at bedtime.  420 mL 0  . Vitamin D, Ergocalciferol, (DRISDOL) 50000 units CAPS capsule Take 1 capsule (50,000 Units total) by mouth every 7 (seven) days. 7 capsule 6   No current facility-administered medications on file prior to visit.    Review of Systems  Constitutional: Negative for fever, chills, appetite change and unexpected weight change.  Respiratory: Negative for cough and shortness of breath.   Cardiovascular: Negative for chest pain, palpitations and leg swelling.  Gastrointestinal: Negative for nausea, vomiting, abdominal pain, diarrhea, constipation and blood in stool.  Endocrine: Positive for polydipsia and polyuria.  Genitourinary: Negative for dysuria and difficulty urinating.       Filed Vitals:   07/05/15 1442  BP: 137/91  Pulse: 92  Temp: 98.5 F (36.9 C)  TempSrc: Oral  Weight: 139 lb 12.8 oz  (63.413 kg)  SpO2: 100%     Objective:   Physical Exam  Constitutional: He is oriented to person, place, and time. He appears well-developed. No distress.  HENT:  Head: Normocephalic and atraumatic.  Mouth/Throat: Oropharynx is clear and moist. No oropharyngeal exudate.  Eyes: Conjunctivae and EOM are normal. Pupils are equal, round, and reactive to light. No scleral icterus.  Neck: Neck supple.  Cardiovascular: Normal rate, regular rhythm and normal heart sounds.  Exam reveals no gallop and no friction rub.   No murmur heard. Pulmonary/Chest: Effort normal. No respiratory distress. He has no wheezes. He has no rales.  Abdominal: Soft. Bowel sounds are normal. He exhibits no distension and no mass. There is no tenderness. There is no rebound.  Musculoskeletal: Normal range of motion. He exhibits no edema.  Neurological: He is alert and oriented to person, place, and time. No cranial nerve deficit.  Skin: Skin is warm and dry. He is not diaphoretic.  Skin of feet intact w/o ulceration.  Soles are very dry.  Psychiatric: He has a normal mood and affect. His behavior is normal.  Vitals reviewed.         Assessment & Plan:  Please see problem based charting for A&P.

## 2015-07-07 ENCOUNTER — Other Ambulatory Visit: Payer: Self-pay | Admitting: Internal Medicine

## 2015-07-07 DIAGNOSIS — H40013 Open angle with borderline findings, low risk, bilateral: Secondary | ICD-10-CM | POA: Diagnosis not present

## 2015-07-07 DIAGNOSIS — H2513 Age-related nuclear cataract, bilateral: Secondary | ICD-10-CM | POA: Diagnosis not present

## 2015-07-07 DIAGNOSIS — E119 Type 2 diabetes mellitus without complications: Secondary | ICD-10-CM | POA: Diagnosis not present

## 2015-07-07 LAB — HM DIABETES EYE EXAM

## 2015-07-07 NOTE — Progress Notes (Signed)
Internal Medicine Clinic Attending  Case discussed with Dr. Wilson soon after the resident saw the patient.  We reviewed the resident's history and exam and pertinent patient test results.  I agree with the assessment, diagnosis, and plan of care documented in the resident's note.  

## 2015-07-12 DIAGNOSIS — G894 Chronic pain syndrome: Secondary | ICD-10-CM | POA: Diagnosis not present

## 2015-07-12 DIAGNOSIS — M25562 Pain in left knee: Secondary | ICD-10-CM | POA: Diagnosis not present

## 2015-07-12 DIAGNOSIS — Z79899 Other long term (current) drug therapy: Secondary | ICD-10-CM | POA: Diagnosis not present

## 2015-07-12 DIAGNOSIS — Z5181 Encounter for therapeutic drug level monitoring: Secondary | ICD-10-CM | POA: Diagnosis not present

## 2015-07-12 DIAGNOSIS — M25561 Pain in right knee: Secondary | ICD-10-CM | POA: Diagnosis not present

## 2015-07-14 ENCOUNTER — Encounter: Payer: Self-pay | Admitting: *Deleted

## 2015-07-15 ENCOUNTER — Telehealth: Payer: Self-pay | Admitting: Internal Medicine

## 2015-07-15 NOTE — Telephone Encounter (Signed)
Pt gets fluoxetine from [facility], he was informed to call [facility], he was agreeable

## 2015-07-15 NOTE — Telephone Encounter (Signed)
Requesting refill of bp medication for patient. He is new to pharmacy and they do not know the name and neither does the patient.

## 2015-07-15 NOTE — Telephone Encounter (Signed)
Have spoken to pharmacist at [facility], they have scripts for asa and lovastatin Pt also sees a Producer, television/film/video in [place] at a pain management office

## 2015-07-15 NOTE — Telephone Encounter (Signed)
Pt call sayin he needs FLUoxetine (PROZAC) 20 MG capsule

## 2015-07-15 NOTE — Telephone Encounter (Signed)
Lovastatin was filled by dr Naaman Plummer in 04/2015 w/ 90 day supply plus 1. He also takes asa daily, it was also sent in 04/2015. Both [facility]

## 2015-07-19 ENCOUNTER — Encounter: Payer: Self-pay | Admitting: Licensed Clinical Social Worker

## 2015-07-20 ENCOUNTER — Other Ambulatory Visit: Payer: Self-pay | Admitting: *Deleted

## 2015-07-20 ENCOUNTER — Telehealth: Payer: Self-pay | Admitting: Internal Medicine

## 2015-07-20 ENCOUNTER — Encounter: Payer: Self-pay | Admitting: *Deleted

## 2015-07-20 ENCOUNTER — Telehealth: Payer: Self-pay | Admitting: Pulmonary Disease

## 2015-07-20 DIAGNOSIS — Z794 Long term (current) use of insulin: Principal | ICD-10-CM

## 2015-07-20 DIAGNOSIS — E0810 Diabetes mellitus due to underlying condition with ketoacidosis without coma: Secondary | ICD-10-CM

## 2015-07-20 DIAGNOSIS — E1142 Type 2 diabetes mellitus with diabetic polyneuropathy: Secondary | ICD-10-CM

## 2015-07-20 MED ORDER — INSULIN PEN NEEDLE 31G X 5 MM MISC: Status: DC

## 2015-07-20 MED ORDER — AMITRIPTYLINE HCL 25 MG PO TABS
25.0000 mg | ORAL_TABLET | Freq: Every day | ORAL | Status: DC
Start: 2015-07-20 — End: 2015-09-06

## 2015-07-20 NOTE — Telephone Encounter (Signed)
i have tried to call pt this am, lm for rtc- will schedule w/ resident and diab.

## 2015-07-20 NOTE — Telephone Encounter (Signed)
Coordinated care with our triage nurse and can assist as needed.

## 2015-07-20 NOTE — Telephone Encounter (Signed)
Pt is changing pharmacies, pen needle script cannot be transferred, will need new one Glipizide has been discontinued for pt for a while

## 2015-07-20 NOTE — Telephone Encounter (Signed)
   Reason for call:   I received a call from [name] at 6:35 AM about his diabetes.   Pertinent Data:   No new changes. He just wants to make sure I left a note for the clinic.   Assessment / Plan / Recommendations:   Note already placed.   As always, pt is advised that if symptoms worsen or new symptoms arise, they should go to an urgent care facility or to ER for further evaluation.   Milagros Loll, MD   07/20/2015, 6:34 AM

## 2015-07-20 NOTE — Telephone Encounter (Signed)
   Reason for call:   I received a call from Michael [name] at 2:15 AM indicating he is having hyperglycemia.   Pertinent Data:   Last seen in clinic 07/05/2015. A1c was found to be deteriorated from 7.7 03/12/2015 to 9.6. He was asked to increase his Levemir from 18u daily to 20u daily.  His blood glucoses have been in the 200s to 300s still.  He wants a prescription for glipizide. This was discontinued in 09/2012 when he was admitted for hypoglycemia and seizures. He was then admitted 10/2014 due to pancreatitis - insulin was restarted and his goal A1c was recommended to be liberal around 8-9.   Assessment / Plan / Recommendations:   He is close to his goal A1c. Will ask Triage RN to call him and see how he is doing during clinic hours today - recommend offering him an appointment to follow up on his diabetes, since I think he has a poor understanding of his situation. May consider an appointment with [name].  As always, pt is advised that if symptoms worsen or new symptoms arise, they should go to an urgent care facility or to ER for further evaluation.   Milagros Loll, MD   07/20/2015, 2:19 AM

## 2015-07-20 NOTE — Telephone Encounter (Signed)
Glipizide and pen needles refills [facility]

## 2015-07-20 NOTE — Telephone Encounter (Signed)
Called pt again, 3 times today, lm for rtc

## 2015-07-20 NOTE — Telephone Encounter (Signed)
Patient needs appointment scheduled. Last was on 6/12 with Dr. Redmond Pulling recommending 73mo F/U for his diabetes.

## 2015-07-20 NOTE — Telephone Encounter (Signed)
Message sent to front office to schedule pt an appt. 

## 2015-07-21 ENCOUNTER — Telehealth: Payer: Self-pay | Admitting: Licensed Clinical Social Worker

## 2015-07-21 DIAGNOSIS — E1142 Type 2 diabetes mellitus with diabetic polyneuropathy: Secondary | ICD-10-CM

## 2015-07-21 NOTE — Telephone Encounter (Signed)
Michael [name] was referred to CSW by CDE to inquire about pt's status with behavioral health services.  CSW placed called to pt.  CSW left message requesting return call. CSW provided contact hours and phone number.  Upon review of the Provider Portal no behavioral health services have been billed to pt's Medicaid this year.  Michael [name] returned call to [name], he has not been to [name] in over a year but is still taking medications they have prescribed.  Pt states he is still taking Abilify and Prozac, even though he has not been seen in over a year.  Pt uses [facility] located within [name] for his behavioral health medications.  CSW placed call to [place] to obtain a 6 month refill history to be faxed to triage.   CSW discussed referral to [facility] for medication mangement/pharmacy to obtain an account of medications in-home.  Pt agreeable.  CSW confirmed phone numbers.

## 2015-07-21 NOTE — Telephone Encounter (Signed)
No answer when called 

## 2015-07-21 NOTE — Telephone Encounter (Signed)
Glipizide discontinued quite awhile ago New script for pen needles sent

## 2015-07-28 ENCOUNTER — Other Ambulatory Visit: Payer: Self-pay | Admitting: Internal Medicine

## 2015-07-28 NOTE — Telephone Encounter (Signed)
Too early for Creon (written 90-day supply 06/30/2015). Gabapentin 60 day supply written 06/22/2015

## 2015-07-30 ENCOUNTER — Other Ambulatory Visit: Payer: Self-pay

## 2015-07-30 NOTE — Patient Outreach (Signed)
[place] [facility]) Care Management  07/30/2015  Michael [name] Apr 12, 1955 [id number]   Referral received from internal medicine clinic regarding medications. Member with history of bipolar and diabetes. Per Edwena Blow Brady's note, member has not been to [facility] in over a year and is still taking mental health medications. RNCM called to complete screening. No answer. HIPPA compliant message left. Case discussed with [facility] pharmacist.  Plan: follow up call next week try to schedule home visit with community care coordinator and pharmacist.  Thea Silversmith, RN, MSN, [place] Coordinator Cell: [phone]

## 2015-08-02 ENCOUNTER — Other Ambulatory Visit: Payer: Self-pay

## 2015-08-02 NOTE — Patient Outreach (Addendum)
[place] [facility]) Care Management  08/02/2015  SNEIJDER STREY 1955/08/22 [id number]  Referral received from internal medicine clinic regarding medications. RNCM explained care management program. Member agrees to participation. Member reports living with his sister. Member states he is able to obtain his medications and has transportation to his doctor appointment. History of diabetes. Member reports he also goes to the pain clinic in [place].   Plan: home visit scheduled. Co-visit with [facility] pharmacist.  Thea Silversmith, RN, MSN, [place] Coordinator Cell: [phone]'

## 2015-08-04 ENCOUNTER — Encounter: Payer: Self-pay | Admitting: Internal Medicine

## 2015-08-04 ENCOUNTER — Ambulatory Visit: Payer: Medicaid Other

## 2015-08-11 ENCOUNTER — Other Ambulatory Visit: Payer: Self-pay

## 2015-08-11 NOTE — Patient Outreach (Signed)
[place] [facility]) Care Management  08/11/2015  Michael [name] Dec 12, 1955 [id number]  RNCM called regarding rescheduling visit. Unable to leave voice message.  Plan: continue to attempt to reach.  Thea Silversmith, RN, MSN, [place] Coordinator Cell: [phone]

## 2015-08-12 ENCOUNTER — Other Ambulatory Visit: Payer: Self-pay | Admitting: *Deleted

## 2015-08-12 ENCOUNTER — Other Ambulatory Visit: Payer: Self-pay | Admitting: Pharmacist

## 2015-08-12 ENCOUNTER — Other Ambulatory Visit: Payer: Self-pay

## 2015-08-12 MED ORDER — MAGNESIUM OXIDE 400 (241.3 MG) MG PO TABS: 400.0000 mg | ORAL_TABLET | Freq: Every day | ORAL | Status: DC

## 2015-08-12 NOTE — Telephone Encounter (Signed)
This was requested while the [facility] nurse, juana was in the home, pt has a blood sugar at this time of 475, his average is in the mid to hi 300's, he had also had a big container of regular kool aid this am, he does not take his insulin at the time he is prescribed to "sometimes in the evening and sometimes in the am", spoke w/ donnap., she states she will speak with dr rice about and insulin regimen that pt can be flexible with,i had spoken w/ pt about changing pharmacies so often and while on the ph ask juana to advise pt to pick one pharmacy and stay with it, he stated [facility] then, he ask for a refill on magnesium, i informed them that it would be 7 to 10 days before he rec'd the mag once it was approved, he was agreeable. juana reminded pt of his next appt with dr rice

## 2015-08-12 NOTE — Patient Outreach (Signed)
Michael [name]) Care Management  [place]  08/12/2015   Michael [name] 1955-05-02 [id number]  Subjective: member reports he has had an issue with falling in the past. State he is taking his medications, but admits that he may forget a few times.  Objective: BP 130/70 mmHg  Pulse 88  Resp 20  SpO2 100%, skin warm dry,lungs clear, heart rate regular.   Encounter Medications:  Outpatient Encounter Prescriptions as of 08/12/2015  Medication Sig Note  . ACCU-CHEK FASTCLIX LANCETS MISC Use to check your blood sugar 3 times a day   . amitriptyline (ELAVIL) 25 MG tablet Take 1 tablet (25 mg total) by mouth at bedtime. 08/12/2015: Taking 75 mg twice daily (three 25 mg tablets twice daily)  . BD INSULIN SYRINGE ULTRAFINE 31G X 5/16" 0.3 ML MISC Use to inject insulin into  the skin at bedtime each  day   . Blood Glucose Monitoring Suppl (ACCU-CHEK AVIVA PLUS) W/DEVICE KIT Check blood sugar up to 3 times a day   . diclofenac sodium (VOLTAREN) 1 % GEL Apply 2 g topically 4 (four) times daily.   . folic acid (FOLVITE) 1 MG tablet Take 1 tablet by mouth  daily   . gabapentin (NEURONTIN) 300 MG capsule TAKE 3 CAPSULES BY MOUTH  EVERY MORNING AND  AFTERNOON. TAKE 4 CAPSULES  BY MOUTH AT NIGHT BEFORE  BED   . glucose blood (ACCU-CHEK AVIVA PLUS) test strip Use to check blood sugar 3 to 4 times daily. diag code E11.9. Insulin dependent   . Insulin Detemir (LEVEMIR FLEXTOUCH) 100 UNIT/ML Pen Inject 20 units daily before bedtime   . Insulin Pen Needle 31G X 5 MM MISC Use to inject 15 units of Levemir one time a day   . Lancet Devices (ACCU-CHEK SOFTCLIX) lancets Use to check your blood sugar 3 times a day. Dx code E11.65. Insulin dependent.   . lipase/protease/amylase (CREON) 36000 UNITS CPEP capsule Take 1 capsule (36,000 Units total) by mouth 3 (three) times daily before meals.   . lovastatin (MEVACOR) 40 MG tablet Take 1 tablet by mouth at  bedtime   . magnesium oxide (MAG-OX) 400  (241.3 Mg) MG tablet Take 1 tablet (400 mg total) by mouth daily.   . pantoprazole (PROTONIX) 40 MG tablet Take 1 tablet by mouth  daily   . Vitamin D, Ergocalciferol, (DRISDOL) 50000 units CAPS capsule Take 1 capsule (50,000 Units total) by mouth every 7 (seven) days.   Marland Kitchen acetaminophen (TYLENOL) 500 MG tablet Take 1 tablet (500 mg total) by mouth every 6 (six) hours as needed. (Patient not taking: Reported on 08/12/2015)   . ARIPiprazole (ABILIFY) 2 MG tablet Take 2 mg by mouth daily. Reported on 08/12/2015 08/12/2015: Hasnt taken in 7 months per patient   . ARIPiprazole (ABILIFY) 20 MG tablet Take 20 mg by mouth daily. Reported on 08/12/2015   . aspirin EC 81 MG tablet Take 1 tablet (81 mg total) by mouth at bedtime. (Patient not taking: Reported on 08/12/2015)   . feeding supplement, GLUCERNA SHAKE, (GLUCERNA SHAKE) LIQD Take 237 mLs by mouth 3 (three) times daily between meals. (Patient not taking: Reported on 08/12/2015)   . FLUoxetine (PROZAC) 20 MG capsule Take 20 mg by mouth 2 (two) times daily. Reported on 08/12/2015 08/12/2015: Hasnt taken in 7 months  . HYDROcodone-acetaminophen (NORCO) 10-325 MG tablet Take 1 tablet by mouth every 6 (six) hours as needed (4-6 hours as needed). (Patient not taking: Reported on 08/12/2015)   .  Multiple Vitamin (MULTIVITAMIN WITH MINERALS) TABS tablet Take 1 tablet by mouth daily. (Patient not taking: Reported on 08/12/2015)   . sucralfate (CARAFATE) 1 GM/10ML suspension Take 10 mLs (1 g total) by mouth 4 (four) times daily -  with meals and at bedtime. (Patient not taking: Reported on 08/12/2015)    No facility-administered encounter medications on file as of 08/12/2015.    Functional Status:  In your present state of health, do you have any difficulty performing the following activities: 08/12/2015 07/05/2015  Hearing? N N  Vision? Y N  Difficulty concentrating or making decisions? Y N  Walking or climbing stairs? Y N  Dressing or bathing? N N  Doing errands,  shopping? Y N  Preparing Food and eating ? N -  Using the Toilet? N -  In the past six months, have you accidently leaked urine? N -  Do you have problems with loss of bowel control? N -  Managing your Medications? Y -  Managing your Finances? N -  Housekeeping or managing your Housekeeping? N -    Fall/Depression Screening: PHQ 2/9 Scores 08/02/2015 07/05/2015 03/12/2015 01/28/2015 12/24/2014 11/25/2014 11/17/2014  PHQ - 2 Score 0 0 0 0 0 0 0  PHQ- 9 Score - - - - - - -   Fall Risk  08/02/2015 07/05/2015 03/12/2015 01/28/2015 12/24/2014  Falls in the past year? Yes Yes Yes Yes Yes  Number falls in past yr: 2 or more 2 or more 2 or more 2 or more 2 or more  Injury with Fall? - Yes No No No  Risk Factor Category  High Fall Risk High Fall Risk High Fall Risk High Fall Risk High Fall Risk  Risk for fall due to : History of fall(s) Other (Comment);Impaired balance/gait History of fall(s);Other (Comment) History of fall(s);Other (Comment) History of fall(s);Medication side effect  Risk for fall due to (comments): - Felled in tub/seizures. Seizure SEIZURE -  Follow up Falls prevention discussed Falls prevention discussed - - -    Assessment: 60 year old with history of diabetes, chronic pain, pancreatitis. Co-Visit with Pharmacy resident, Michael [name]. Member reports he used to drink alcohol, but states he does not drink any more. Member reports he has problems with neuropathy, vision. Member has an upcoming eye appointment scheduled. Member reports he has been seen at the [name], but has not been there in over a year. Pain management is with the [facility] in [name] - per member for leg/feet/hand pain, neuropathy, arthritis.  Blood sugar elevated-member reports drinking Kool-aid prior to home visit. Blood sugar 475 during home visit. RNCM called primary care office, spoke with nurse, Michael [name]. Michael [name] consulted with Michael [name] (diabetic dietician). Per speaker, Michael [name] provided  instructions to member. Member has taken his insulin this morning. Member states he primarily takes his insulin at night, but sometimes takes in the morning and just decided to take his insulin this mornng. Member encourage to check blood sugar in a couple of hours to make sure it is coming down. RNCM discussed A1C and importance of controlling blood sugar level and how that could help further progression of symptoms he may be experiencing neuropathy, pain, vision issues. Member is agreeable to work in diabetes management. RNCM contact number provided. Member encouraged to call for questions/problems.  See Pharmacy note.  Plan: home visit to follow up on goals. [facility] CM Care Plan Problem One        Most Recent Value   Care Plan Problem  One  knoledge deficit regarding diabetes managment as evidiece by member's report of high blood sugars.   Role Documenting the Problem One  Care Management Coordinator   Care Plan for Problem One  Active   THN CM Short Term Goal #1 (0-30 days)  member wil check and record blood sugars within the next 30 days.   THN CM Short Term Goal #1 Start Date  08/12/15   THN CM Short Term Goal #2 (0-30 days)  member will use only one meter to check blood sugars within the next 30 days.   THN CM Short Term Goal #2 Start Date  08/12/15   Interventions for Short Term Goal #2  discussed importance of using only one meter for blood sugar monitoring.    THN CM Care Plan Problem Two        Most Recent Value   Care Plan Problem Two  at risk for fall due to member report of history of falls.   Role Documenting the Problem Two  Care Management Coordinator   Care Plan for Problem Two  Active   THN CM Short Term Goal #1 (0-30 days)  member will verbalize at least three strategies for fall prevention within the next 30 days.   THN CM Short Term Goal #1 Start Date  08/12/15   Interventions for Short Term Goal #2   fall prevention strategies discussed.       Thea Silversmith, RN, MSN,  [place] Coordinator Cell: [phone]

## 2015-08-13 NOTE — Patient Outreach (Signed)
[facility]) Care Management  [place]   08/13/2015  Michael [name] November 24, 1955 [id number]  Subjective: 60 year old male referred to pharmacy for medication review by physician due to confusion over behavioral health medications including Abilify and Prozac. Patient has not been to [facility] in over a year but told [facility] that he was still taking both Abilify and Prozac.   Objective:    Encounter Medications: Outpatient Encounter Prescriptions as of 08/12/2015  Medication Sig Note  . ACCU-CHEK FASTCLIX LANCETS MISC Use to check your blood sugar 3 times a day   . amitriptyline (ELAVIL) 25 MG tablet Take 1 tablet (25 mg total) by mouth at bedtime. (Patient taking differently: Take 75 mg by mouth 2 (two) times daily. )   . BD INSULIN SYRINGE ULTRAFINE 31G X 5/16" 0.3 ML MISC Use to inject insulin into  the skin at bedtime each  day   . Blood Glucose Monitoring Suppl (ACCU-CHEK AVIVA PLUS) W/DEVICE KIT Check blood sugar up to 3 times a day   . diclofenac sodium (VOLTAREN) 1 % GEL Apply 2 g topically 4 (four) times daily.   . folic acid (FOLVITE) 1 MG tablet Take 1 tablet by mouth  daily   . gabapentin (NEURONTIN) 300 MG capsule TAKE 3 CAPSULES BY MOUTH  EVERY MORNING AND  AFTERNOON. TAKE 4 CAPSULES  BY MOUTH AT NIGHT BEFORE  BED   . glucose blood (ACCU-CHEK AVIVA PLUS) test strip Use to check blood sugar 3 to 4 times daily. diag code E11.9. Insulin dependent   . Insulin Detemir (LEVEMIR FLEXTOUCH) 100 UNIT/ML Pen Inject 20 units daily before bedtime   . Insulin Pen Needle 31G X 5 MM MISC Use to inject 15 units of Levemir one time a day   . Lancet Devices (ACCU-CHEK SOFTCLIX) lancets Use to check your blood sugar 3 times a day. Dx code E11.65. Insulin dependent.   . lipase/protease/amylase (CREON) 36000 UNITS CPEP capsule Take 1 capsule (36,000 Units total) by mouth 3 (three) times daily before meals.   . lovastatin (MEVACOR) 40 MG tablet Take 1 tablet by  mouth at  bedtime   . magnesium oxide (MAG-OX) 400 (241.3 Mg) MG tablet Take 1 tablet (400 mg total) by mouth daily.   . pantoprazole (PROTONIX) 40 MG tablet Take 1 tablet by mouth  daily   . Vitamin D, Ergocalciferol, (DRISDOL) 50000 units CAPS capsule Take 1 capsule (50,000 Units total) by mouth every 7 (seven) days. 08/12/2015: Every Sunday   . [DISCONTINUED] acetaminophen (TYLENOL) 500 MG tablet Take 1 tablet (500 mg total) by mouth every 6 (six) hours as needed. (Patient not taking: Reported on 08/12/2015)   . [DISCONTINUED] ARIPiprazole (ABILIFY) 2 MG tablet Take 2 mg by mouth daily. Reported on 08/12/2015 08/12/2015: Hasnt taken in 7 months per patient   . [DISCONTINUED] ARIPiprazole (ABILIFY) 20 MG tablet Take 20 mg by mouth daily. Reported on 08/12/2015   . [DISCONTINUED] aspirin EC 81 MG tablet Take 1 tablet (81 mg total) by mouth at bedtime. (Patient not taking: Reported on 08/12/2015)   . [DISCONTINUED] feeding supplement, GLUCERNA SHAKE, (GLUCERNA SHAKE) LIQD Take 237 mLs by mouth 3 (three) times daily between meals. (Patient not taking: Reported on 08/12/2015)   . [DISCONTINUED] FLUoxetine (PROZAC) 20 MG capsule Take 20 mg by mouth 2 (two) times daily. Reported on 08/12/2015 08/12/2015: Hasnt taken in 7 months  . [DISCONTINUED] HYDROcodone-acetaminophen (NORCO) 10-325 MG tablet Take 1 tablet by mouth every 6 (six) hours  as needed (4-6 hours as needed). (Patient not taking: Reported on 08/12/2015)   . [DISCONTINUED] Multiple Vitamin (MULTIVITAMIN WITH MINERALS) TABS tablet Take 1 tablet by mouth daily. (Patient not taking: Reported on 08/12/2015)   . [DISCONTINUED] sucralfate (CARAFATE) 1 GM/10ML suspension Take 10 mLs (1 g total) by mouth 4 (four) times daily -  with meals and at bedtime. (Patient not taking: Reported on 08/12/2015)    No facility-administered encounter medications on file as of 08/12/2015.    Functional Status: In your present state of health, do you have any difficulty  performing the following activities: 08/12/2015 07/05/2015  Hearing? N N  Vision? Y N  Difficulty concentrating or making decisions? Y N  Walking or climbing stairs? Y N  Dressing or bathing? N N  Doing errands, shopping? Y N  Preparing Food and eating ? N -  Using the Toilet? N -  In the past six months, have you accidently leaked urine? N -  Do you have problems with loss of bowel control? N -  Managing your Medications? Y -  Managing your Finances? N -  Housekeeping or managing your Housekeeping? N -    Fall/Depression Screening: PHQ 2/9 Scores 08/02/2015 07/05/2015 03/12/2015 01/28/2015 12/24/2014 11/25/2014 11/17/2014  PHQ - 2 Score 0 0 0 0 0 0 0  PHQ- 9 Score - - - - - - -    Assessment:  Drugs sorted by system:  Neurologic/Psychologic: amitriptyline  Cardiovascular: lovastatin  Gastrointestinal: Creon, pantoprazole  Endocrine: Levemir  Topical: diclofenac  Pain: gabapentin  Vitamins/Minerals: folic acid, magnesium oxide, vitamin D  Medications to avoid in the elderly: amitriptyline Other issues noted: Patient was mixing different medications in the same medicine bottle. Based on his last refill dates he was not taking his medications as prescribed as some of his medications were more than 1-2 months past when they should have already been refilled. Patient is not currently taking Abilify or Prozac and states he has not taken them for over 6 months.    Plan: -Patient is currently taking the following medications: amitriptyline, diclofenac topical, folic acid, gabapentin, Levemir, Creon, lovastatin, magnesium oxide, pantoprazole, vitamin D. Medication list has been updated to reflect changes.  -Will follow-up with patients primary care physician for behavioral health medications since patient has not been taking Abilify or Prozac for over 6 months -Filled 1 week supply of medications in weekly pill box -Will followup in 1 week to refill a 2 week supply of medications in  weekly pill boxes -Provided adherence education and will instruct patient on how to correctly fill his pill box  [name], PharmD [place] Resident  [phone]

## 2015-08-19 ENCOUNTER — Other Ambulatory Visit: Payer: Self-pay

## 2015-08-19 ENCOUNTER — Other Ambulatory Visit: Payer: Self-pay | Admitting: Pharmacist

## 2015-08-19 ENCOUNTER — Encounter: Payer: Self-pay | Admitting: Pharmacist

## 2015-08-19 VITALS — BP 124/78 | HR 102 | Resp 20 | Ht 64.0 in | Wt 143.0 lb

## 2015-08-19 DIAGNOSIS — E1140 Type 2 diabetes mellitus with diabetic neuropathy, unspecified: Secondary | ICD-10-CM

## 2015-08-19 NOTE — Patient Outreach (Addendum)
[facility]) Care Management  [place]  08/19/2015   Michael [name] Jul 17, 1955 [id number]  Subjective: member reports he has been checking blood sugars and taking medications. Member reports he has stopped drinking Koolaid and is watching what he eats.  Objective: BP 124/78   Pulse (!) 102   Resp 20   Ht 1.626 m (5' 4" )   Wt 143 lb (64.9 kg) Comment: patient reported.  SpO2 100%   BMI 24.55 kg/m ,lungs clear, heart rate regular.   Encounter Medications:  Outpatient Encounter Prescriptions as of 08/19/2015  Medication Sig Note  . ACCU-CHEK FASTCLIX LANCETS MISC Use to check your blood sugar 3 times a day   . amitriptyline (ELAVIL) 25 MG tablet Take 1 tablet (25 mg total) by mouth at bedtime. (Patient taking differently: Take 75 mg by mouth 2 (two) times daily. )   . BD INSULIN SYRINGE ULTRAFINE 31G X 5/16" 0.3 ML MISC Use to inject insulin into  the skin at bedtime each  day   . Blood Glucose Monitoring Suppl (ACCU-CHEK AVIVA PLUS) W/DEVICE KIT Check blood sugar up to 3 times a day   . diclofenac sodium (VOLTAREN) 1 % GEL Apply 2 g topically 4 (four) times daily.   . folic acid (FOLVITE) 1 MG tablet Take 1 tablet by mouth  daily   . gabapentin (NEURONTIN) 300 MG capsule TAKE 3 CAPSULES BY MOUTH  EVERY MORNING AND  AFTERNOON. TAKE 4 CAPSULES  BY MOUTH AT NIGHT BEFORE  BED   . glucose blood (ACCU-CHEK AVIVA PLUS) test strip Use to check blood sugar 3 to 4 times daily. diag code E11.9. Insulin dependent   . Insulin Detemir (LEVEMIR FLEXTOUCH) 100 UNIT/ML Pen Inject 20 units daily before bedtime   . Insulin Pen Needle 31G X 5 MM MISC Use to inject 15 units of Levemir one time a day   . Lancet Devices (ACCU-CHEK SOFTCLIX) lancets Use to check your blood sugar 3 times a day. Dx code E11.65. Insulin dependent.   . lipase/protease/amylase (CREON) 36000 UNITS CPEP capsule Take 1 capsule (36,000 Units total) by mouth 3 (three) times daily before meals.   . lovastatin  (MEVACOR) 40 MG tablet Take 1 tablet by mouth at  bedtime   . magnesium oxide (MAG-OX) 400 (241.3 Mg) MG tablet Take 1 tablet (400 mg total) by mouth daily.   . pantoprazole (PROTONIX) 40 MG tablet Take 1 tablet by mouth  daily   . Vitamin D, Ergocalciferol, (DRISDOL) 50000 units CAPS capsule Take 1 capsule (50,000 Units total) by mouth every 7 (seven) days. 08/12/2015: Every Sunday    No facility-administered encounter medications on file as of 08/19/2015.     Functional Status:  In your present state of health, do you have any difficulty performing the following activities: 08/12/2015 07/05/2015  Hearing? N N  Vision? Y N  Difficulty concentrating or making decisions? Y N  Walking or climbing stairs? Y N  Dressing or bathing? N N  Doing errands, shopping? Y N  Preparing Food and eating ? N -  Using the Toilet? N -  In the past six months, have you accidently leaked urine? N -  Do you have problems with loss of bowel control? N -  Managing your Medications? Y -  Managing your Finances? N -  Housekeeping or managing your Housekeeping? N -  Some recent data might be hidden    Fall/Depression Screening: PHQ 2/9 Scores 08/02/2015 07/05/2015 03/12/2015 01/28/2015 12/24/2014 11/25/2014 11/17/2014  PHQ - 2 Score 0 0 0 0 0 0 0  PHQ- 9 Score - - - - - - -    Assessment: co-visit with [name], pharmacy resident. 60 year old referred regarding medication adherence. Member is also diabetic and has been eating drinking without regard to his diabetes. Last home visit member's blood sugar was above 400 however, member had just had a large container of Koo-aide. Member's blood sugar today better. Member reports he is no longer drinking Kool-aid and is watching what he is eating better. RNCM reviewed [facility], how insulin work and allow the glucose to be used by the body. RNCM also discussed healthy snacks. RNCM encouraged member to follow up with diabetes nutritionist at his provider's office. Member stated he  would.  Continues to smoke, but is not interested in smoking cessation at this time.  Member is agreeable to additional diabetic education reinforcement per health coach.  Plan: transition to health coach.  Thea Silversmith, RN, MSN, [place] Coordinator Cell: [phone]

## 2015-08-25 ENCOUNTER — Other Ambulatory Visit: Payer: Self-pay | Admitting: Internal Medicine

## 2015-08-25 NOTE — Telephone Encounter (Signed)
Last apt 07/05/15.  F/U appt 09/17/15.

## 2015-08-30 ENCOUNTER — Other Ambulatory Visit: Payer: Self-pay

## 2015-08-30 NOTE — Patient Outreach (Signed)
[facility]) Care Management  08/30/2015  NICHALAS SHUPE Mar 06, 1955 [id number]   Telephone call to patient for introductory call.  Patient reports he is doing ok.  Explained to patient health coach role.  He verbalized understanding and is receptive to call.  Patient offers no concerns.    Plan: RN Health Coach will contact patient in the month of August and patient agrees to next outreach.    Jone Baseman, RN, MSN [place] [phone]

## 2015-08-30 NOTE — Patient Outreach (Addendum)
[facility]) Care Management  [place]   08/30/2015  Michael [name] 08-Dec-1955 [id number]  Subjective: 60 year old male initially referred to pharmacy for medication review by physician due to confusion over behavioral health medications.  At initial home visit patient had confusion about his medications and showed lack of adherence to his regimen based on refill dates.  At followup home visit patient had much improved adherence and missed only 1 dose of medications from his pillbox.  He states he has difficulty seeing and needs to pick up a pair of reading glasses.   Objective:   Encounter Medications: Outpatient Encounter Prescriptions as of 08/19/2015  Medication Sig Note  . ACCU-CHEK FASTCLIX LANCETS MISC Use to check your blood sugar 3 times a day   . amitriptyline (ELAVIL) 25 MG tablet Take 1 tablet (25 mg total) by mouth at bedtime. (Patient taking differently: Take 75 mg by mouth 2 (two) times daily. )   . BD INSULIN SYRINGE ULTRAFINE 31G X 5/16" 0.3 ML MISC Use to inject insulin into  the skin at bedtime each  day   . Blood Glucose Monitoring Suppl (ACCU-CHEK AVIVA PLUS) W/DEVICE KIT Check blood sugar up to 3 times a day   . diclofenac sodium (VOLTAREN) 1 % GEL Apply 2 g topically 4 (four) times daily.   . folic acid (FOLVITE) 1 MG tablet Take 1 tablet by mouth  daily   . gabapentin (NEURONTIN) 300 MG capsule TAKE 3 CAPSULES BY MOUTH  EVERY MORNING AND  AFTERNOON. TAKE 4 CAPSULES  BY MOUTH AT NIGHT BEFORE  BED   . glucose blood (ACCU-CHEK AVIVA PLUS) test strip Use to check blood sugar 3 to 4 times daily. diag code E11.9. Insulin dependent   . Insulin Detemir (LEVEMIR FLEXTOUCH) 100 UNIT/ML Pen Inject 20 units daily before bedtime   . Insulin Pen Needle 31G X 5 MM MISC Use to inject 15 units of Levemir one time a day   . Lancet Devices (ACCU-CHEK SOFTCLIX) lancets Use to check your blood sugar 3 times a day. Dx code E11.65. Insulin dependent.   . lovastatin  (MEVACOR) 40 MG tablet Take 1 tablet by mouth at  bedtime   . magnesium oxide (MAG-OX) 400 (241.3 Mg) MG tablet Take 1 tablet (400 mg total) by mouth daily.   . pantoprazole (PROTONIX) 40 MG tablet Take 1 tablet by mouth  daily   . Vitamin D, Ergocalciferol, (DRISDOL) 50000 units CAPS capsule Take 1 capsule (50,000 Units total) by mouth every 7 (seven) days. 08/12/2015: Every Sunday   . [DISCONTINUED] lipase/protease/amylase (CREON) 36000 UNITS CPEP capsule Take 1 capsule (36,000 Units total) by mouth 3 (three) times daily before meals.    No facility-administered encounter medications on file as of 08/19/2015.     Functional Status: In your present state of health, do you have any difficulty performing the following activities: 08/12/2015 07/05/2015  Hearing? N N  Vision? Y N  Difficulty concentrating or making decisions? Y N  Walking or climbing stairs? Y N  Dressing or bathing? N N  Doing errands, shopping? Y N  Preparing Food and eating ? N -  Using the Toilet? N -  In the past six months, have you accidently leaked urine? N -  Do you have problems with loss of bowel control? N -  Managing your Medications? Y -  Managing your Finances? N -  Housekeeping or managing your Housekeeping? N -  Some recent data might be hidden  Fall/Depression Screening: PHQ 2/9 Scores 08/02/2015 07/05/2015 03/12/2015 01/28/2015 12/24/2014 11/25/2014 11/17/2014  PHQ - 2 Score 0 0 0 0 0 0 0  PHQ- 9 Score - - - - - - -    Assessment: Drugs sorted by system:  Neurologic/Psychologic: amitriptyline  Cardiovascular: lovastatin  Gastrointestinal: Creon, pantoprazole  Endocrine: Levemir  Topical: diclofenac  Pain: gabapentin  Vitamins/Minerals: folic acid, magnesium oxide, vitamin D  Medications to avoid in the elderly: amitriptyline Other issues noted: Patient currently on amitriptyline 75 mg BID (prescribed 150 mg QHS but takes 75 mg BID due to excess drowsiness)   Plan: -Discussed  important of adherence and plan to teach patient how to fill his pill box at the next visit after he obtains reading glasses so he can read his pill bottles -Will follow-up with patients primary care physician about amitriptyline 75 mg BID as this is how he has been taking the medication from a previous prescription from [facility] -Filled 2 week supply of medications in weekly pill box -Will followup in 2 weeks to show patient how to refill a 2 week supply of medications in weekly pill boxes  [name], PharmD [facility] PGY2 Pharmacy Resident [phone]

## 2015-08-31 DIAGNOSIS — E1142 Type 2 diabetes mellitus with diabetic polyneuropathy: Secondary | ICD-10-CM | POA: Diagnosis not present

## 2015-08-31 DIAGNOSIS — G894 Chronic pain syndrome: Secondary | ICD-10-CM | POA: Diagnosis not present

## 2015-08-31 DIAGNOSIS — M2550 Pain in unspecified joint: Secondary | ICD-10-CM | POA: Diagnosis not present

## 2015-08-31 DIAGNOSIS — M25561 Pain in right knee: Secondary | ICD-10-CM | POA: Diagnosis not present

## 2015-09-01 ENCOUNTER — Ambulatory Visit: Payer: Self-pay | Admitting: Pharmacist

## 2015-09-02 ENCOUNTER — Inpatient Hospital Stay (HOSPITAL_COMMUNITY): Payer: Medicare Other

## 2015-09-02 ENCOUNTER — Inpatient Hospital Stay (HOSPITAL_COMMUNITY)
Admission: EM | Admit: 2015-09-02 | Discharge: 2015-09-03 | DRG: 638 | Disposition: A | Discharge status: Home / Self Care | Payer: Medicare Other | Attending: Family Medicine | Admitting: Family Medicine

## 2015-09-02 ENCOUNTER — Encounter (HOSPITAL_COMMUNITY): Payer: Self-pay | Admitting: Emergency Medicine

## 2015-09-02 DIAGNOSIS — R4182 Altered mental status, unspecified: Secondary | ICD-10-CM | POA: Diagnosis present

## 2015-09-02 DIAGNOSIS — Z794 Long term (current) use of insulin: Secondary | ICD-10-CM | POA: Diagnosis not present

## 2015-09-02 DIAGNOSIS — K860 Alcohol-induced chronic pancreatitis: Secondary | ICD-10-CM

## 2015-09-02 DIAGNOSIS — T383X6A Underdosing of insulin and oral hypoglycemic [antidiabetic] drugs, initial encounter: Secondary | ICD-10-CM | POA: Diagnosis not present

## 2015-09-02 DIAGNOSIS — R109 Unspecified abdominal pain: Secondary | ICD-10-CM

## 2015-09-02 DIAGNOSIS — F319 Bipolar disorder, unspecified: Secondary | ICD-10-CM | POA: Diagnosis present

## 2015-09-02 DIAGNOSIS — R739 Hyperglycemia, unspecified: Secondary | ICD-10-CM

## 2015-09-02 DIAGNOSIS — E1169 Type 2 diabetes mellitus with other specified complication: Secondary | ICD-10-CM | POA: Diagnosis not present

## 2015-09-02 DIAGNOSIS — E1165 Type 2 diabetes mellitus with hyperglycemia: Secondary | ICD-10-CM | POA: Diagnosis not present

## 2015-09-02 DIAGNOSIS — E1110 Type 2 diabetes mellitus with ketoacidosis without coma: Secondary | ICD-10-CM

## 2015-09-02 DIAGNOSIS — G8929 Other chronic pain: Secondary | ICD-10-CM

## 2015-09-02 DIAGNOSIS — E1100 Type 2 diabetes mellitus with hyperosmolarity without nonketotic hyperglycemic-hyperosmolar coma (NKHHC): Secondary | ICD-10-CM | POA: Diagnosis not present

## 2015-09-02 DIAGNOSIS — E1140 Type 2 diabetes mellitus with diabetic neuropathy, unspecified: Secondary | ICD-10-CM | POA: Diagnosis not present

## 2015-09-02 DIAGNOSIS — E872 Acidosis: Secondary | ICD-10-CM | POA: Diagnosis not present

## 2015-09-02 DIAGNOSIS — R7889 Finding of other specified substances, not normally found in blood: Secondary | ICD-10-CM | POA: Diagnosis not present

## 2015-09-02 DIAGNOSIS — Z79899 Other long term (current) drug therapy: Secondary | ICD-10-CM | POA: Diagnosis not present

## 2015-09-02 DIAGNOSIS — Z91128 Patient's intentional underdosing of medication regimen for other reason: Secondary | ICD-10-CM

## 2015-09-02 DIAGNOSIS — E11649 Type 2 diabetes mellitus with hypoglycemia without coma: Secondary | ICD-10-CM | POA: Diagnosis not present

## 2015-09-02 DIAGNOSIS — R404 Transient alteration of awareness: Secondary | ICD-10-CM | POA: Diagnosis not present

## 2015-09-02 DIAGNOSIS — F17210 Nicotine dependence, cigarettes, uncomplicated: Secondary | ICD-10-CM | POA: Diagnosis present

## 2015-09-02 DIAGNOSIS — I10 Essential (primary) hypertension: Secondary | ICD-10-CM | POA: Diagnosis not present

## 2015-09-02 DIAGNOSIS — F1010 Alcohol abuse, uncomplicated: Secondary | ICD-10-CM | POA: Diagnosis present

## 2015-09-02 DIAGNOSIS — R7309 Other abnormal glucose: Secondary | ICD-10-CM | POA: Diagnosis not present

## 2015-09-02 DIAGNOSIS — E1010 Type 1 diabetes mellitus with ketoacidosis without coma: Secondary | ICD-10-CM | POA: Diagnosis not present

## 2015-09-02 DIAGNOSIS — Z720 Tobacco use: Secondary | ICD-10-CM | POA: Diagnosis not present

## 2015-09-02 LAB — COMPREHENSIVE METABOLIC PANEL
ALBUMIN: 2.9 g/dL — AB (ref 3.5–5.0)
ALT: 86 U/L — AB (ref 17–63)
AST: 110 U/L — AB (ref 15–41)
Alkaline Phosphatase: 305 U/L — ABNORMAL HIGH (ref 38–126)
Anion gap: 9 (ref 5–15)
CHLORIDE: 98 mmol/L — AB (ref 101–111)
CO2: 22 mmol/L (ref 22–32)
CREATININE: 0.99 mg/dL (ref 0.61–1.24)
Calcium: 8.7 mg/dL — ABNORMAL LOW (ref 8.9–10.3)
GFR calc Af Amer: >60 mL/min (ref >60)
GLUCOSE: 1059 mg/dL — AB (ref 65–99)
POTASSIUM: 3.9 mmol/L (ref 3.5–5.1)
Sodium: 129 mmol/L — ABNORMAL LOW (ref 135–145)
Total Bilirubin: 0.8 mg/dL (ref 0.3–1.2)
Total Protein: 5.3 g/dL — ABNORMAL LOW (ref 6.5–8.1)

## 2015-09-02 LAB — AMMONIA: Ammonia: 31 umol/L (ref 9–35)

## 2015-09-02 LAB — I-STAT VENOUS BLOOD GAS, ED
ACID-BASE DEFICIT: 1 mmol/L (ref 0.0–2.0)
Bicarbonate: 26.7 mEq/L — ABNORMAL HIGH (ref 20.0–24.0)
O2 SAT: 58 %
PCO2 VEN: 56.1 mmHg — AB (ref 45.0–50.0)
PO2 VEN: 34 mmHg (ref 31.0–45.0)
TCO2: 28 mmol/L (ref 0–100)
pH, Ven: 7.286 (ref 7.250–7.300)

## 2015-09-02 LAB — CBC
HEMATOCRIT: 38.6 % — AB (ref 39.0–52.0)
Hemoglobin: 12.3 g/dL — ABNORMAL LOW (ref 13.0–17.0)
MCH: 31.9 pg (ref 26.0–34.0)
MCHC: 31.9 g/dL (ref 30.0–36.0)
MCV: 100.3 fL — AB (ref 78.0–100.0)
PLATELETS: 141 10*3/uL — AB (ref 150–400)
RBC: 3.85 MIL/uL — ABNORMAL LOW (ref 4.22–5.81)
RDW: 14.2 % (ref 11.5–15.5)
WBC: 5.9 10*3/uL (ref 4.0–10.5)

## 2015-09-02 LAB — URINALYSIS, ROUTINE W REFLEX MICROSCOPIC
BILIRUBIN URINE: NEGATIVE
HGB URINE DIPSTICK: NEGATIVE
Ketones, ur: NEGATIVE mg/dL
Leukocytes, UA: NEGATIVE
Nitrite: NEGATIVE
PROTEIN: NEGATIVE mg/dL
Specific Gravity, Urine: 1.031 — ABNORMAL HIGH (ref 1.005–1.030)
pH: 6.5 (ref 5.0–8.0)

## 2015-09-02 LAB — CBG MONITORING, ED
GLUCOSE-CAPILLARY: 480 mg/dL — AB (ref 65–99)
Glucose-Capillary: >600 mg/dL (ref 65–99)

## 2015-09-02 LAB — MRSA PCR SCREENING: MRSA BY PCR: NEGATIVE

## 2015-09-02 LAB — URINE MICROSCOPIC-ADD ON
RBC / HPF: NONE SEEN RBC/hpf (ref 0–5)
Squamous Epithelial / LPF: NONE SEEN
WBC, UA: NONE SEEN WBC/hpf (ref 0–5)

## 2015-09-02 LAB — RAPID URINE DRUG SCREEN, HOSP PERFORMED
AMPHETAMINES: NOT DETECTED
BARBITURATES: NOT DETECTED
BENZODIAZEPINES: NOT DETECTED
COCAINE: NOT DETECTED
Opiates: NOT DETECTED
TETRAHYDROCANNABINOL: NOT DETECTED

## 2015-09-02 LAB — PROTIME-INR
INR: 1.02
PROTHROMBIN TIME: 13.5 s (ref 11.4–15.2)

## 2015-09-02 LAB — GLUCOSE, CAPILLARY
GLUCOSE-CAPILLARY: 155 mg/dL — AB (ref 65–99)
Glucose-Capillary: 347 mg/dL — ABNORMAL HIGH (ref 65–99)

## 2015-09-02 MED ORDER — BISACODYL 5 MG PO TBEC
5.0000 mg | DELAYED_RELEASE_TABLET | Freq: Every day | ORAL | Status: DC | PRN
Start: 2015-09-02 — End: 2015-09-03

## 2015-09-02 MED ORDER — FOLIC ACID 1 MG PO TABS
1.0000 mg | ORAL_TABLET | Freq: Every day | ORAL | Status: DC
  Administered 2015-09-03: 1 mg via ORAL
  Filled 2015-09-02: qty 1

## 2015-09-02 MED ORDER — SODIUM CHLORIDE 0.9 % IV SOLN
INTRAVENOUS | Status: DC
  Administered 2015-09-02: 20:00:00 via INTRAVENOUS

## 2015-09-02 MED ORDER — SODIUM CHLORIDE 0.9% FLUSH
3.0000 mL | Freq: Two times a day (BID) | INTRAVENOUS | Status: DC
  Administered 2015-09-02 – 2015-09-03 (×2): 3 mL via INTRAVENOUS

## 2015-09-02 MED ORDER — PRAVASTATIN SODIUM 40 MG PO TABS
40.0000 mg | ORAL_TABLET | Freq: Every day | ORAL | Status: DC
  Administered 2015-09-03: 40 mg via ORAL
  Filled 2015-09-02: qty 1

## 2015-09-02 MED ORDER — SODIUM CHLORIDE 0.9 % IV BOLUS (SEPSIS)
1000.0000 mL | Freq: Once | INTRAVENOUS | Status: AC
  Administered 2015-09-02: 1000 mL via INTRAVENOUS

## 2015-09-02 MED ORDER — PANTOPRAZOLE SODIUM 40 MG PO TBEC
40.0000 mg | DELAYED_RELEASE_TABLET | Freq: Every day | ORAL | Status: DC
  Administered 2015-09-03: 40 mg via ORAL
  Filled 2015-09-02: qty 1

## 2015-09-02 MED ORDER — SODIUM CHLORIDE 0.9 % IV SOLN
INTRAVENOUS | Status: DC
  Administered 2015-09-02: 5.4 [IU]/h via INTRAVENOUS
  Filled 2015-09-02: qty 2.5

## 2015-09-02 MED ORDER — POTASSIUM CHLORIDE 10 MEQ/100ML IV SOLN
10.0000 meq | INTRAVENOUS | Status: DC
  Administered 2015-09-02: 10 meq via INTRAVENOUS
  Filled 2015-09-02: qty 100

## 2015-09-02 MED ORDER — MAGNESIUM OXIDE 400 (241.3 MG) MG PO TABS: 400.0000 mg | ORAL_TABLET | Freq: Every day | ORAL | Status: DC

## 2015-09-02 MED ORDER — DEXTROSE-NACL 5-0.45 % IV SOLN
INTRAVENOUS | Status: DC
  Administered 2015-09-02: 23:00:00 via INTRAVENOUS

## 2015-09-02 MED ORDER — ACETAMINOPHEN 325 MG PO TABS: 650.0000 mg | ORAL_TABLET | Freq: Four times a day (QID) | ORAL | Status: DC | PRN

## 2015-09-02 MED ORDER — INSULIN REGULAR BOLUS VIA INFUSION
0.0000 [IU] | Freq: Three times a day (TID) | INTRAVENOUS | Status: DC
  Filled 2015-09-02: qty 10

## 2015-09-02 MED ORDER — SODIUM CHLORIDE 0.9 % IV SOLN
INTRAVENOUS | Status: DC
  Administered 2015-09-02: 2.9 [IU]/h via INTRAVENOUS

## 2015-09-02 MED ORDER — SODIUM CHLORIDE 0.9 % IV SOLN
INTRAVENOUS | Status: DC
  Administered 2015-09-02: 22:00:00 via INTRAVENOUS

## 2015-09-02 MED ORDER — NALOXONE HCL 0.4 MG/ML IJ SOLN
0.4000 mg | Freq: Once | INTRAMUSCULAR | Status: AC
  Administered 2015-09-02: 0.4 mg via INTRAVENOUS

## 2015-09-02 MED ORDER — ENOXAPARIN SODIUM 40 MG/0.4ML ~~LOC~~ SOLN
40.0000 mg | SUBCUTANEOUS | Status: DC
  Administered 2015-09-02: 40 mg via SUBCUTANEOUS
  Filled 2015-09-02: qty 0.4

## 2015-09-02 MED ORDER — NALOXONE HCL 0.4 MG/ML IJ SOLN
INTRAMUSCULAR | Status: AC
  Filled 2015-09-02: qty 1

## 2015-09-02 MED ORDER — PANCRELIPASE (LIP-PROT-AMYL) 36000-114000 UNITS PO CPEP
36000.0000 [IU] | ORAL_CAPSULE | Freq: Three times a day (TID) | ORAL | Status: DC
  Administered 2015-09-03 (×3): 36000 [IU] via ORAL
  Filled 2015-09-02 (×3): qty 1

## 2015-09-02 MED ORDER — VITAMIN D (ERGOCALCIFEROL) 1.25 MG (50000 UNIT) PO CAPS: 50000.0000 [IU] | ORAL_CAPSULE | ORAL | Status: DC

## 2015-09-02 MED ORDER — AMITRIPTYLINE HCL 50 MG PO TABS: 25.0000 mg | ORAL_TABLET | Freq: Every day | ORAL | Status: DC

## 2015-09-02 MED ORDER — DEXTROSE 50 % IV SOLN: 25.0000 mL | INTRAVENOUS | Status: DC | PRN

## 2015-09-02 MED ORDER — TRAMADOL HCL 50 MG PO TABS: 50.0000 mg | ORAL_TABLET | Freq: Four times a day (QID) | ORAL | Status: DC | PRN

## 2015-09-02 MED ORDER — ONDANSETRON HCL 4 MG PO TABS: 4.0000 mg | ORAL_TABLET | Freq: Four times a day (QID) | ORAL | Status: DC | PRN

## 2015-09-02 MED ORDER — ONDANSETRON HCL 4 MG/2ML IJ SOLN: 4.0000 mg | Freq: Four times a day (QID) | INTRAMUSCULAR | Status: DC | PRN

## 2015-09-02 MED ORDER — DEXTROSE-NACL 5-0.45 % IV SOLN: INTRAVENOUS | Status: DC

## 2015-09-02 MED ORDER — ACETAMINOPHEN 650 MG RE SUPP: 650.0000 mg | Freq: Four times a day (QID) | RECTAL | Status: DC | PRN

## 2015-09-02 NOTE — ED Triage Notes (Signed)
Per ems pt brought from home for altered mental status/ slurred speech that started yesterday. Pts family also checked cbg which was "high". Pt is alert and oriented to person and place. Pt seems lethargic and has left sided facial droop which slurred speech. cgb in ed also reads "high".

## 2015-09-02 NOTE — ED Notes (Signed)
Pt's CBG result was 480. Informed [place] - RN.

## 2015-09-02 NOTE — ED Provider Notes (Signed)
Michael [name] DEPT Provider Note   CSN: [id number] Arrival date & time: 09/02/15  1724  First Provider Contact:  None       History   Chief Complaint Chief Complaint  Patient presents with  . Altered Mental Status  . Hyperglycemia    HPI Michael [name] is a 60 y.o. male.  The history is provided by the patient. No language interpreter was used.  Altered Mental Status   This is a new problem. The current episode started yesterday. The problem has not changed since onset.Associated symptoms include confusion and somnolence. Pertinent negatives include no unresponsiveness, no agitation, no delusions, no hallucinations and no violence. Risk factors include alcohol intake and the patient not taking medications correctly (denies infectious symptoms). His past medical history is significant for diabetes, seizures and hypertension. His past medical history does not include CVA, depression, dementia, psychotropic medication treatment or head trauma. Liver disease: unknown.    Past Medical History:  Diagnosis Date  . Diabetes mellitus without complication ([place])   . Pancreatitis   . Seizures [facility])     Patient Active Problem List   Diagnosis Date Noted  . Chronic pain 03/14/2015  . Vitamin D deficiency 03/12/2015  . Hyperlipidemia 02/17/2015  . Fall 12/24/2014  . History of alcohol abuse 12/05/2014  . Protein-calorie malnutrition, severe 11/18/2014  . Alcohol withdrawal ([place])   . Cervical arthritis ([place]) 01/01/2014  . Health care maintenance 08/25/2011  . Chronic alcoholic pancreatitis ([place]) 04/20/2011  . Type 2 diabetes mellitus with peripheral neuropathy ([place]) 12/09/2010  . HTN (hypertension) 12/09/2010    Past Surgical History:  Procedure Laterality Date  . BALLOON DILATION  03/22/2011   Procedure: BALLOON DILATION;  Surgeon: Gatha Mayer, MD;  [facility];  Service: Endoscopy;  Laterality: N/A;  . COLONOSCOPY N/A 05/22/2012   Procedure: COLONOSCOPY;  Surgeon:  Gatha Mayer, MD;  Location: [place];  Service: Endoscopy;  Laterality: N/A;  . COLONOSCOPY W/ BIOPSIES AND POLYPECTOMY  09/12/11  . ESOPHAGOGASTRODUODENOSCOPY  03/22/2011   Procedure: ESOPHAGOGASTRODUODENOSCOPY (EGD);  Surgeon: Gatha Mayer, MD;  [facility];  Service: Endoscopy;  Laterality: N/A;  egd with balloon   . EUS  04/20/2011   Procedure: UPPER ENDOSCOPIC ULTRASOUND (EUS) LINEAR;  Surgeon: Milus Banister, MD;  [facility];  Service: Endoscopy;  Laterality: N/A;       Home Medications    Prior to Admission medications   Medication Sig Start Date End Date Taking? Authorizing Provider  ACCU-CHEK FASTCLIX LANCETS MISC Use to check your blood sugar 3 times a day 03/17/15   Juluis Mire, MD  amitriptyline (ELAVIL) 25 MG tablet Take 1 tablet (25 mg total) by mouth at bedtime. Patient taking differently: Take 75 mg by mouth 2 (two) times daily.  07/20/15   Collier Salina, MD  BD INSULIN SYRINGE ULTRAFINE 31G X 5/16" 0.3 ML MISC Use to inject insulin into  the skin at bedtime each  day 07/30/15   Collier Salina, MD  Blood Glucose Monitoring Suppl (ACCU-CHEK AVIVA PLUS) W/DEVICE KIT Check blood sugar up to 3 times a day 11/26/14   Juluis Mire, MD  CREON 36000 units CPEP capsule Take 1 capsule by mouth 3  times daily before meals 08/26/15   Collier Salina, MD  diclofenac sodium (VOLTAREN) 1 % GEL Apply 2 g topically 4 (four) times daily. 03/17/15   Juluis Mire, MD  folic acid (FOLVITE) 1 MG tablet Take 1 tablet by mouth  daily 06/29/15  Otis Brace, MD  gabapentin (NEURONTIN) 300 MG capsule TAKE 3 CAPSULES BY MOUTH  EVERY MORNING AND  AFTERNOON. TAKE 4 CAPSULES  BY MOUTH AT NIGHT BEFORE  BED 07/30/15   Fuller Plan, MD  glucose blood (ACCU-CHEK AVIVA PLUS) test strip Use to check blood sugar 3 to 4 times daily. diag code E11.9. Insulin dependent 03/17/15   Otis Brace, MD  Insulin Detemir (LEVEMIR FLEXTOUCH) 100 UNIT/ML Pen Inject 20 units  daily before bedtime 07/05/15   Yolanda Manges, DO  Insulin Pen Needle 31G X 5 MM MISC Use to inject 15 units of Levemir one time a day 07/20/15   Fuller Plan, MD  Lancet Devices [facility]) lancets Use to check your blood sugar 3 times a day. Dx code E11.65. Insulin dependent. 03/12/15   Otis Brace, MD  lovastatin (MEVACOR) 40 MG tablet Take 1 tablet by mouth at  bedtime 05/05/15   Otis Brace, MD  magnesium oxide (MAG-OX) 400 (241.3 Mg) MG tablet Take 1 tablet (400 mg total) by mouth daily. 08/12/15   Fuller Plan, MD  pantoprazole (PROTONIX) 40 MG tablet Take 1 tablet by mouth  daily 05/05/15   Otis Brace, MD  Vitamin D, Ergocalciferol, (DRISDOL) 50000 units CAPS capsule Take 1 capsule (50,000 Units total) by mouth every 7 (seven) days. 02/26/15   Levert Feinstein, MD    Family History Family History  Problem Relation Age of Onset  . Hypertension Sister   . Diabetes Sister   . Heart disease Sister   . Colon cancer Neg Hx   . Stomach cancer Neg Hx   . Anesthesia problems Neg Hx   . Hypotension Neg Hx   . Pseudochol deficiency Neg Hx   . Malignant hyperthermia Neg Hx     Social History Social History  Substance Use Topics  . Smoking status: Current Every Day Smoker    Packs/day: 0.40    Years: 40.00    Types: Cigarettes  . Smokeless tobacco: Never Used     Comment: 7-8CIGARETTES  . Alcohol use No     Comment: 11/17/2014 "was drinking 2-5 drinks; stopped ~ 4-5 months"     Allergies   Ace inhibitors and Losartan   Review of Systems Review of Systems  Unable to perform ROS: Acuity of condition (limited due to somnolence)  Constitutional: Negative for diaphoresis and fever.  Respiratory: Negative for cough and shortness of breath.   Cardiovascular: Negative for chest pain.  Gastrointestinal: Negative for abdominal pain.  Genitourinary: Negative for dysuria.  Psychiatric/Behavioral: Positive for confusion. Negative for agitation and  hallucinations.     Physical Exam Updated Vital Signs BP 144/96   Pulse 104   Temp 98.3 F (36.8 C) (Oral)   Resp 14   Ht 5\' 4"  (1.626 m)   Wt 64.9 kg   SpO2 100%   BMI 24.55 kg/m   Physical Exam  Constitutional: He appears well-developed and well-nourished.  HENT:  Head: Normocephalic and atraumatic.  Eyes: Conjunctivae are normal.  Neck: Neck supple.  Cardiovascular: Normal rate and regular rhythm.   No murmur heard. Pulmonary/Chest: Effort normal and breath sounds normal. No respiratory distress.  Abdominal: Soft. There is no tenderness.  Musculoskeletal: He exhibits no edema.  Neurological: He is alert. GCS eye subscore is 4. GCS verbal subscore is 4. GCS motor subscore is 6.  Best GCS 14 due to being unable to answer ROS questions, but was A and O x 3 in the Ed. Waxes and  wanes down to a GCS of 11  Skin: Skin is warm and dry.  Psychiatric: He has a normal mood and affect.  Nursing note and vitals reviewed.    ED Treatments / Results  Labs (all labs ordered are listed, but only abnormal results are displayed) Labs Reviewed  COMPREHENSIVE METABOLIC PANEL - Abnormal; Notable for the following:       Result Value   Sodium 129 (*)    Chloride 98 (*)    Glucose, Bld 1,059 (*)    BUN <5 (*)    Calcium 8.7 (*)    Total Protein 5.3 (*)    Albumin 2.9 (*)    AST 110 (*)    ALT 86 (*)    Alkaline Phosphatase 305 (*)    All other components within normal limits  CBC - Abnormal; Notable for the following:    RBC 3.85 (*)    Hemoglobin 12.3 (*)    HCT 38.6 (*)    MCV 100.3 (*)    Platelets 141 (*)    All other components within normal limits  URINALYSIS, ROUTINE W REFLEX MICROSCOPIC (NOT AT [facility]) - Abnormal; Notable for the following:    Specific Gravity, Urine 1.031 (*)    Glucose, UA >1000 (*)    All other components within normal limits  URINE MICROSCOPIC-ADD ON - Abnormal; Notable for the following:    Bacteria, UA RARE (*)    All other components within  normal limits  GLUCOSE, CAPILLARY - Abnormal; Notable for the following:    Glucose-Capillary 347 (*)    All other components within normal limits  COMPREHENSIVE METABOLIC PANEL - Abnormal; Notable for the following:    Potassium 2.7 (*)    Glucose, Bld 151 (*)    BUN <5 (*)    Creatinine, Ser 0.48 (*)    Calcium 8.6 (*)    Total Protein 4.8 (*)    Albumin 2.6 (*)    AST 46 (*)    ALT 66 (*)    Alkaline Phosphatase 226 (*)    All other components within normal limits  CBC - Abnormal; Notable for the following:    RBC 3.54 (*)    Hemoglobin 11.2 (*)    HCT 33.4 (*)    Platelets 137 (*)    All other components within normal limits  GLUCOSE, CAPILLARY - Abnormal; Notable for the following:    Glucose-Capillary 155 (*)    All other components within normal limits  GLUCOSE, CAPILLARY - Abnormal; Notable for the following:    Glucose-Capillary 131 (*)    All other components within normal limits  GLUCOSE, CAPILLARY - Abnormal; Notable for the following:    Glucose-Capillary 149 (*)    All other components within normal limits  MAGNESIUM - Abnormal; Notable for the following:    Magnesium 1.5 (*)    All other components within normal limits  GLUCOSE, CAPILLARY - Abnormal; Notable for the following:    Glucose-Capillary 64 (*)    All other components within normal limits  GLUCOSE, CAPILLARY - Abnormal; Notable for the following:    Glucose-Capillary 156 (*)    All other components within normal limits  GLUCOSE, CAPILLARY - Abnormal; Notable for the following:    Glucose-Capillary 217 (*)    All other components within normal limits  BASIC METABOLIC PANEL - Abnormal; Notable for the following:    BUN <5 (*)    Calcium 8.8 (*)    All other components within normal limits  GLUCOSE, CAPILLARY - Abnormal; Notable for the following:    Glucose-Capillary 120 (*)    All other components within normal limits  CBG MONITORING, ED - Abnormal; Notable for the following:     Glucose-Capillary >600 (*)    All other components within normal limits  I-STAT VENOUS BLOOD GAS, ED - Abnormal; Notable for the following:    pCO2, Ven 56.1 (*)    Bicarbonate 26.7 (*)    All other components within normal limits  CBG MONITORING, ED - Abnormal; Notable for the following:    Glucose-Capillary >600 (*)    All other components within normal limits  CBG MONITORING, ED - Abnormal; Notable for the following:    Glucose-Capillary 480 (*)    All other components within normal limits  MRSA PCR SCREENING  PROTIME-INR  URINE RAPID DRUG SCREEN, HOSP PERFORMED  AMMONIA  GLUCOSE, CAPILLARY    EKG  EKG Interpretation  Date/Time:  Thursday September 02 2015 17:30:58 EDT Ventricular Rate:  111 PR Interval:    QRS Duration: 78 QT Interval:  311 QTC Calculation: 423 R Axis:   46 Text Interpretation:  Sinus tachycardia Minimal ST depression, lateral leads No significant change since last tracing Confirmed by Winfred Leeds  MD, SAM [phone]) on 09/03/2015 5:36:17 PM       Radiology CXR  Mild pulmonary vascular congestion.  Procedures Procedures (including critical care time)  Medications Ordered in ED Medications  sodium chloride 0.9 % bolus 1,000 mL (1,000 mLs Intravenous New Bag/Given 09/02/15 1800)  Started on IV insulin in the ED.   Initial Impression / Assessment and Plan / ED Course  I have reviewed the triage vital signs and the nursing notes.  Pertinent labs & imaging results that were available during my care of the patient were reviewed by me and considered in my medical decision making (see chart for details).  Clinical Course   60 yo male presents to the emergency department for 1 day of altered mental status.  Family is concerned that patient's is hyperglycemic.  Confirmed in the emergency department.  Basic infectious screening was performed to rule it out as a precipitant  versus noncompliance.  Chest x-ray was unremarkable.  UA and UDS were unremarkable.   Patient also has a history of alcohol use. Last use earlier this morning.  Unlikely to be withdrawal.  Unlikely a stroke since your exam was grossly unremarkable.  There was concern by family for a left facial droop however was not appreciable on exam. Neuro exam and cranial nerve exam WNL. Patient was not in DKA since anion gap was not elevated and CO2 was 22. EKG was obtained and pH was 7.26.  Patient is acidotic however secondary to CO2 retention.  Patient has a respiratory acidosis and metabolic acidosis. Ammonia was within normal limits.   Patient was started on fluids and IV insulin to the emergency department.  Glucose level were over 1000.  EKG was obtained with the patient the importance program and being on Amitriptyline.  No signs of toxicity were noted. Discussed case with hospitalist in order to admit patient for observation until glucose level normalizes mental status improves. Patient made aware of plan and is in agreement. Tachycardia improved with fluids. Patient and VS stable at transfer.  Final Clinical Impressions(s) / ED Diagnoses   Final diagnoses:  None  Hyperglycemia Altered Mental status.  New Prescriptions New Prescriptions   No medications on file     Darlina Rumpf, MD 09/05/15 [place]  Johnney Killian, MD 10/12/15 [phone]

## 2015-09-02 NOTE — ED Notes (Signed)
MD pfeiffer made aware of pts condition.

## 2015-09-02 NOTE — ED Notes (Signed)
Pt using a urinal at present

## 2015-09-02 NOTE — ED Notes (Signed)
Dr.Schertz at bedside  

## 2015-09-02 NOTE — Progress Notes (Signed)
Called per floor RN at 2145 regarding newly admitted Patient, somnolent with minimal response to sternal rub. Upon my arrival Pt found resting in bed, appears sleep. Pt opens eyes to sternal rub and makes eye contact, mumbles a few words and drifts off. Positive gag reflex, strong cough, protecting airway well. At times he takes deep irregular breaths. ED RN [place] called and inquired about Neuro status in ED. Per RN Pt had periods of deep sleep and awakened minutes later alert and oriented. Dr. Jonnie Finner paged and updated per floor RN. MD to bedside at 2200 to assess. Per Dr. Jonnie Finner he was lethargic in ED tonight.  Narcan ordered and given x 1. Portable CXR done and reviewed per MD. Per MD to monitor closely tonight, ni further order. RN advised to notify MD and myself for worsening changes.

## 2015-09-02 NOTE — H&P (Addendum)
[facility] History and Physical  Michael [name] IOE:[id number] DOB: Nov 11, 1955 DOA: 09/02/2015  Referring physician: Dr Johnney Killian PCP: Hinton Lovely, MD   Chief Complaint: Altered mental status  HPI: Michael [name] is a 60 y.o. male with hx of HTN, DM2 , etoh abuse, chron abd pain/ chron pancreatitis.  Presented to ED with AMS and slurred speech since yesterday.  Family checked ABG which read "high".  In ED pt was alert and Ox 3 but lethargic.  In ED CBG "high" and serum glucose is 1058.   Asked to see for admission.   Pt vague historian.  Says he only takes his insulin shots about 3 days out of 7.  If he takes it every day his BS goes "too low".  Took it yesterday, not today yet.  No n/v/d, no abd pain or fevers, no sore throat or HA.  No CP/ cough.    Lives w his sister at her place, she cooks and cleans.  He likes to watch TV, visit with friends.  Drinks about "2 beers " per day.  +hx of rehab 20 yrs ago for "drinking".  Grew up in [place], went to [employer].  Never married , no kids.  Has 3 sisters in total, all living.  Mother and father are not alive.    Admissions: 2012 - abd pain poss gastritis, pancreatic lesion, HTN, DM, heavy etoh use/ smoker, hx recur pancreatitis, fatty liver possible 2013 - angioedema due to ACEi (lisinopril), rx with benadryl and steroid taper. Lisinopril dc'd and started on HCTZ.  2014 - hx colon polyps/ adenomas, admitted for colonoscopy w medical supervision. Small polyp removed.  IDDM on insulin. Chron abd pain d/t chron pancreatitis.  2016 - August, uncont DM2, HTN, panc insuff, hypoK/Mg 2016 - Sept, seizures due to etoh abuse/ hypoglycemia/ poor nutrition. dc'd off of insulin. A1c goal should be very liberal ( <9) due to comorbids.  DC'd off any diabetic medication.  2016 - Oct, weight loss, possibly due to holding insulin.  Insulin restarted (A1c up to 11.8).  Displaying more like type 1 DM physiology.  DC levemir 15u/ day, goal A1c 8-9.   2016 - Nov, acute  TME, unclear cause. Had severe hyperglycemia, poss etoh withdrawal. Treated with low dose Precedex drip.  ^WBC/ lactic acidosis/ hypothermia, treated empirically w vanc/ zosyn , then cx's were neg and abx dc'd. ^BS treated with insulin and BS improved nicely.  At dc was on Levemir 15 u and metformin.     ROS  denies CP  no joint pain   no HA  no blurry vision  no rash  no diarrhea  no nausea/ vomiting  no dysuria  no difficulty voiding  no change in urine color    Past Medical History  Past Medical History:  Diagnosis Date  . Diabetes mellitus without complication ([place])   . Pancreatitis   . Seizures [facility])    Past Surgical History  Past Surgical History:  Procedure Laterality Date  . BALLOON DILATION  03/22/2011   Procedure: BALLOON DILATION;  Surgeon: Gatha Mayer, MD;  [facility];  Service: Endoscopy;  Laterality: N/A;  . COLONOSCOPY N/A 05/22/2012   Procedure: COLONOSCOPY;  Surgeon: Gatha Mayer, MD;  Location: [place];  Service: Endoscopy;  Laterality: N/A;  . COLONOSCOPY W/ BIOPSIES AND POLYPECTOMY  09/12/11  . ESOPHAGOGASTRODUODENOSCOPY  03/22/2011   Procedure: ESOPHAGOGASTRODUODENOSCOPY (EGD);  Surgeon: Gatha Mayer, MD;  [facility];  Service: Endoscopy;  Laterality:  N/A;  egd with balloon   . EUS  04/20/2011   Procedure: UPPER ENDOSCOPIC ULTRASOUND (EUS) LINEAR;  Surgeon: Milus Banister, MD;  [facility];  Service: Endoscopy;  Laterality: N/A;   Family History  Family History  Problem Relation Age of Onset  . Hypertension Sister   . Diabetes Sister   . Heart disease Sister   . Colon cancer Neg Hx   . Stomach cancer Neg Hx   . Anesthesia problems Neg Hx   . Hypotension Neg Hx   . Pseudochol deficiency Neg Hx   . Malignant hyperthermia Neg Hx    Social History  reports that he has been smoking Cigarettes.  He has a 16.00 pack-year smoking history. He has never used smokeless tobacco. He reports that he does not drink  alcohol or use drugs. Allergies  Allergies  Allergen Reactions  . Ace Inhibitors Swelling    Angioedema - unquestionable  . Losartan Swelling    Pt presented with unquestionable angioedema on ACEI, so aviod ARBs as well   Home medications Prior to Admission medications   Medication Sig Start Date End Date Taking? Authorizing Provider  ACCU-CHEK FASTCLIX LANCETS MISC Use to check your blood sugar 3 times a day 03/17/15   Juluis Mire, MD  amitriptyline (ELAVIL) 25 MG tablet Take 1 tablet (25 mg total) by mouth at bedtime. Patient taking differently: Take 75 mg by mouth 2 (two) times daily.  07/20/15   Collier Salina, MD  BD INSULIN SYRINGE ULTRAFINE 31G X 5/16" 0.3 ML MISC Use to inject insulin into  the skin at bedtime each  day 07/30/15   Collier Salina, MD  Blood Glucose Monitoring Suppl (ACCU-CHEK AVIVA PLUS) W/DEVICE KIT Check blood sugar up to 3 times a day 11/26/14   Juluis Mire, MD  CREON 36000 units CPEP capsule Take 1 capsule by mouth 3  times daily before meals 08/26/15   Collier Salina, MD  diclofenac sodium (VOLTAREN) 1 % GEL Apply 2 g topically 4 (four) times daily. 03/17/15   Juluis Mire, MD  folic acid (FOLVITE) 1 MG tablet Take 1 tablet by mouth  daily 06/29/15   Juluis Mire, MD  gabapentin (NEURONTIN) 300 MG capsule TAKE 3 CAPSULES BY MOUTH  EVERY MORNING AND  AFTERNOON. TAKE 4 CAPSULES  BY MOUTH AT NIGHT BEFORE  BED 07/30/15   Collier Salina, MD  glucose blood (ACCU-CHEK AVIVA PLUS) test strip Use to check blood sugar 3 to 4 times daily. diag code E11.9. Insulin dependent 03/17/15   Juluis Mire, MD  Insulin Detemir (LEVEMIR FLEXTOUCH) 100 UNIT/ML Pen Inject 20 units daily before bedtime 07/05/15   Francesca Oman, DO  Insulin Pen Needle 31G X 5 MM MISC Use to inject 15 units of Levemir one time a day 07/20/15   Collier Salina, MD  Lancet Devices [facility]) lancets Use to check your blood sugar 3 times a day. Dx code E11.65. Insulin dependent.  03/12/15   Juluis Mire, MD  lovastatin (MEVACOR) 40 MG tablet Take 1 tablet by mouth at  bedtime 05/05/15   Juluis Mire, MD  magnesium oxide (MAG-OX) 400 (241.3 Mg) MG tablet Take 1 tablet (400 mg total) by mouth daily. 08/12/15   Collier Salina, MD  pantoprazole (PROTONIX) 40 MG tablet Take 1 tablet by mouth  daily 05/05/15   Juluis Mire, MD  Vitamin D, Ergocalciferol, (DRISDOL) 50000 units CAPS capsule Take 1 capsule (50,000 Units total) by mouth every 7 (seven)  days. 02/26/15   Annia Belt, MD   Liver Function Tests  Recent Labs Lab 09/02/15 1735  AST 110*  ALT 86*  ALKPHOS 305*  BILITOT 0.8  PROT 5.3*  ALBUMIN 2.9*   No results for input(s): LIPASE, AMYLASE in the last 168 hours. CBC  Recent Labs Lab 09/02/15 1735  WBC 5.9  HGB 12.3*  HCT 38.6*  MCV 100.3*  PLT 888*   Basic Metabolic Panel  Recent Labs Lab 09/02/15 1735  NA 129*  K 3.9  CL 98*  CO2 22  GLUCOSE 1,059*  BUN <5*  CREATININE 0.99  CALCIUM 8.7*     Vitals:   09/02/15 1830 09/02/15 1845 09/02/15 1952 09/02/15 2005  BP: 144/94 154/99 147/98   Pulse: 98 94 84   Resp: 13 25 16    Temp:      TempSrc:      SpO2: 100% 100% 99%   Weight:    59.9 kg (132 lb)  Height:    5' 4"  (1.626 m)   Exam: Gen alert, slurred speech but may be baseline, no distress No rash, cyanosis or gangrene Sclera anicteric, throat clear and dry  No jvd or bruits Chest clear bilat to bases RRR soft SEM, no RG Abd soft ntnd no mass or ascites +bs scaphoid GU normal male MS no joint effusions or deformity Ext no LE or UE edema / no wounds or ulcers Neuro is alert, Ox 3 , nonfocal, gen weakness 4/5 strength x 4 ext CN's intact  ABG 7.28/ 56/ 34 Na 129  K 3.9  CO2 22  BUN <5  Cr 0.99   CA 8.7 Glucose 1059  Alb 2.9  NH3 31  AST 110/ ALT 86  Tbili 0.8  Anion gap 9 WBC 5k  Hb 12   plt 141 UA > rare bact, clear, >1000 glu, 6.5, 1.031, neg prot, no rbc/ wbc UDS negative  EKG (independ reviewed) > sinus  tach, no acute changes   Assessment: 1.  AMS - at home per family .  MS clear here.  Due to uncontrolled DM2 - BS 1058 2.  Hyperglycemia/ uncont DM2 - not taking insulin daily, fears low blood sugars. Has history of hyper and hypoglycemia and etoh abuse. Difficult situation if still drinking. No signs of MI or infection. Afebrile.  3.  Etoh abuse - not sure how heavy he is drinking now, doesn't look to be withdrawing in ED 4.  Hx chron abd pain /pancreatitis 5.  Smoker 6.  Bipolar disorder- seen by [facility] in the past and off of prior meds (prozac/ Ability), on Elavil now at home  Plan - admit SDU, IV insulin protocol, IVF"s, clear liquids and advance as tolerated. Get CXR, am labs.       Sol Blazing [facility] Pager [phone]  Cell [phone]  If 7PM-7AM, please contact night-coverage [email or web address] Password [email or web address] 09/02/2015, 8:09 PM

## 2015-09-03 ENCOUNTER — Other Ambulatory Visit: Payer: Self-pay

## 2015-09-03 ENCOUNTER — Ambulatory Visit: Payer: Self-pay | Admitting: Pharmacist

## 2015-09-03 ENCOUNTER — Other Ambulatory Visit: Payer: Self-pay | Admitting: Pharmacist

## 2015-09-03 DIAGNOSIS — Z794 Long term (current) use of insulin: Secondary | ICD-10-CM

## 2015-09-03 DIAGNOSIS — R404 Transient alteration of awareness: Secondary | ICD-10-CM

## 2015-09-03 DIAGNOSIS — E1165 Type 2 diabetes mellitus with hyperglycemia: Secondary | ICD-10-CM

## 2015-09-03 LAB — CBC
HEMATOCRIT: 33.4 % — AB (ref 39.0–52.0)
Hemoglobin: 11.2 g/dL — ABNORMAL LOW (ref 13.0–17.0)
MCH: 31.6 pg (ref 26.0–34.0)
MCHC: 33.5 g/dL (ref 30.0–36.0)
MCV: 94.4 fL (ref 78.0–100.0)
Platelets: 137 10*3/uL — ABNORMAL LOW (ref 150–400)
RBC: 3.54 MIL/uL — ABNORMAL LOW (ref 4.22–5.81)
RDW: 13.5 % (ref 11.5–15.5)
WBC: 6.3 10*3/uL (ref 4.0–10.5)

## 2015-09-03 LAB — COMPREHENSIVE METABOLIC PANEL
ALBUMIN: 2.6 g/dL — AB (ref 3.5–5.0)
ALT: 66 U/L — ABNORMAL HIGH (ref 17–63)
AST: 46 U/L — AB (ref 15–41)
Alkaline Phosphatase: 226 U/L — ABNORMAL HIGH (ref 38–126)
Anion gap: 8 (ref 5–15)
BILIRUBIN TOTAL: 0.6 mg/dL (ref 0.3–1.2)
CHLORIDE: 110 mmol/L (ref 101–111)
CO2: 27 mmol/L (ref 22–32)
Calcium: 8.6 mg/dL — ABNORMAL LOW (ref 8.9–10.3)
Creatinine, Ser: 0.48 mg/dL — ABNORMAL LOW (ref 0.61–1.24)
GFR calc Af Amer: >60 mL/min (ref >60)
GFR calc non Af Amer: >60 mL/min (ref >60)
GLUCOSE: 151 mg/dL — AB (ref 65–99)
POTASSIUM: 2.7 mmol/L — AB (ref 3.5–5.1)
Sodium: 145 mmol/L (ref 135–145)
Total Protein: 4.8 g/dL — ABNORMAL LOW (ref 6.5–8.1)

## 2015-09-03 LAB — GLUCOSE, CAPILLARY
GLUCOSE-CAPILLARY: 131 mg/dL — AB (ref 65–99)
GLUCOSE-CAPILLARY: 64 mg/dL — AB (ref 65–99)
GLUCOSE-CAPILLARY: 217 mg/dL — AB (ref 65–99)
GLUCOSE-CAPILLARY: 120 mg/dL — AB (ref 65–99)
Glucose-Capillary: 149 mg/dL — ABNORMAL HIGH (ref 65–99)
Glucose-Capillary: 156 mg/dL — ABNORMAL HIGH (ref 65–99)
Glucose-Capillary: 87 mg/dL (ref 65–99)

## 2015-09-03 LAB — BASIC METABOLIC PANEL
Anion gap: 7 (ref 5–15)
CHLORIDE: 108 mmol/L (ref 101–111)
CO2: 25 mmol/L (ref 22–32)
CREATININE: 0.64 mg/dL (ref 0.61–1.24)
Calcium: 8.8 mg/dL — ABNORMAL LOW (ref 8.9–10.3)
GFR calc Af Amer: >60 mL/min (ref >60)
GFR calc non Af Amer: >60 mL/min (ref >60)
GLUCOSE: 78 mg/dL (ref 65–99)
POTASSIUM: 4 mmol/L (ref 3.5–5.1)
SODIUM: 140 mmol/L (ref 135–145)

## 2015-09-03 LAB — MAGNESIUM: Magnesium: 1.5 mg/dL — ABNORMAL LOW (ref 1.7–2.4)

## 2015-09-03 MED ORDER — INSULIN ASPART 100 UNIT/ML ~~LOC~~ SOLN
0.0000 [IU] | Freq: Three times a day (TID) | SUBCUTANEOUS | Status: DC
  Administered 2015-09-03: 5 [IU] via SUBCUTANEOUS

## 2015-09-03 MED ORDER — INSULIN DETEMIR 100 UNIT/ML ~~LOC~~ SOLN
20.0000 [IU] | Freq: Every day | SUBCUTANEOUS | Status: DC
  Administered 2015-09-03: 20 [IU] via SUBCUTANEOUS
  Filled 2015-09-03 (×2): qty 0.2

## 2015-09-03 MED ORDER — MAGNESIUM SULFATE 2 GM/50ML IV SOLN
2.0000 g | Freq: Once | INTRAVENOUS | Status: AC
  Administered 2015-09-03: 2 g via INTRAVENOUS
  Filled 2015-09-03: qty 50

## 2015-09-03 MED ORDER — POTASSIUM CHLORIDE 10 MEQ/100ML IV SOLN
10.0000 meq | INTRAVENOUS | Status: AC
  Administered 2015-09-03 (×6): 10 meq via INTRAVENOUS
  Filled 2015-09-03 (×6): qty 100

## 2015-09-03 MED ORDER — INSULIN ASPART 100 UNIT/ML ~~LOC~~ SOLN: 0.0000 [IU] | Freq: Every day | SUBCUTANEOUS | Status: DC

## 2015-09-03 NOTE — Patient Outreach (Signed)
[facility]) Care Management  09/03/2015  GARIEL GLASTETTER 31-Mar-1955 [id number]   Patient noted to be hospitalized.  Will send message to hospital liaison.  Will send letter of discipline closure to physician.   Jone Baseman, RN, MSN [place] [phone]

## 2015-09-03 NOTE — Progress Notes (Signed)
Inpatient Diabetes Program Recommendations  AACE/ADA: New Consensus Statement on Inpatient Glycemic Control (2015)  Target Ranges:  Prepandial:   less than 140 mg/dL      Peak postprandial:   less than 180 mg/dL (1-2 hours)      Critically ill patients:  140 - 180 mg/dL    Inpatient Diabetes Program Recommendations:  Spoke with patient concerning taking insulin as prescribed. Pt. Drowsy currently but states he has plenty of insulin. Reviewed importance of taking his insulin, checking CBGs, and followup with PCP visits.  Thank you, Nani Gasser. Slyvester Latona, RN, MSN, CDE Inpatient Glycemic Control Team Team Pager [phone] (8am-5pm) 09/03/2015 11:31 AM

## 2015-09-03 NOTE — Progress Notes (Signed)
Hypoglycemic Event  CBG: 64   Treatment: 15 GM carbohydrate snack  Symptoms: None  Follow-up CBG: Time:0830      CBG Result:156  Possible Reasons for Event: low PO  intake  Comments/MD notified:    Leeanne Mannan

## 2015-09-03 NOTE — Consult Note (Signed)
   [facility] CM Inpatient Consult   09/03/2015  Michael [name] 20-Jun-1955 [id number]  Patient is currently active with [facility] for chronic disease management services.  Patient has been engaged by a Geologist, engineering and [place].   Our community based plan of care has focused on disease management and community resource support. Patient is a 60 y.o. male with hx of HTN, DM2 , etoh abuse, chron abd pain/ chron pancreatitis.  Presented to ED with AMS and slurred speech. Went by to see the patient who is asleep and noted in chart to be lethargic at times. Did not disturb. Active written consent on file..  Patient will receive a post discharge transition of care call and will be evaluated for monthly home visits for assessments and disease process education.  Made Inpatient Case Manager aware that [facility] following. Of note, [facility] Care Management services does not replace or interfere with any services that are needed or arranged by inpatient case management or social work.  For additional questions or referrals please contact:  Natividad Brood, RN BSN [facility] Liaison  [phone] business mobile phone Toll free office [phone]

## 2015-09-03 NOTE — Discharge Summary (Signed)
Physician Discharge Summary  Michael [name] [id number] DOB: Feb 08, 1955 DOA: 09/02/2015  PCP: Hinton Lovely, MD  Admit date: 09/02/2015 Discharge date: 09/03/2015  Time spent: 25 minutes  Recommendations for Outpatient Follow-up:  1. Follow up PCP in 2 weeks  Discharge Diagnoses:  Principal Problem:   Uncontrolled diabetes mellitus ([place]) Active Problems:   Chronic alcoholic pancreatitis (HCC)   History of alcohol abuse   Chronic abdominal pain   Hyperglycemia   Altered mental status   Alcohol abuse   Tobacco abuse   Discharge Condition: Stable  Diet recommendation: Diabetic diet  Filed Weights   09/02/15 1738 09/02/15 2005 09/03/15 0400  Weight: 64.9 kg (143 lb) 59.9 kg (132 lb) 65.6 kg (144 lb 10 oz)    History of present illness:  61 y.o. male with hx of HTN, DM2 , etoh abuse, chron abd pain/ chron pancreatitis.  Presented to ED with AMS and slurred speech since yesterday.  Family checked ABG which read "high".  In ED pt was alert and Ox 3 but lethargic.  In ED CBG "high" and serum glucose is 1058.   Asked to see for admission.   Pt vague historian.  Says he only takes his insulin shots about 3 days out of 7.  If he takes it every day his BS goes "too low".  Took it yesterday, not today yet.  No n/v/d, no abd pain or fevers, no sore throat or HA.  No CP/ cough.    Hospital Course:   Hyperosmolar hyperglycemic state- resolved, after he was given IV fluids and IV insulin,blood glucose is 120. Will discharge home. He takes Levemir 20 units subcut daily.  AMS- from above, he also started taking Lyrica on 08/31/15 for diabetic neuropathy. Will discontinue Lyrica at this time.  ETOH abuse- no signs and symptoms of alcohol withdrawal.  Bipolar disorder- seen by [facility] in the past and off of prior meds (prozac/ Ability), on Elavil now at home     Procedures:  none  Consultations:  None   Discharge Exam: Vitals:   09/03/15 0700 09/03/15 0801  BP: 100/76 105/72   Pulse: 87 88  Resp: 14 17  Temp:  98.1 F (36.7 C)    General: Appears in no acute distress Cardiovascular: S1S2 RRR Respiratory: Clear bilaterally  Discharge Instructions   Discharge Instructions    Diet - low sodium heart healthy    Complete by:  As directed   Increase activity slowly    Complete by:  As directed     Current Discharge Medication List    CONTINUE these medications which have NOT CHANGED   Details  ACCU-CHEK FASTCLIX LANCETS MISC Use to check your blood sugar 3 times a day Qty: 102 each, Refills: 5   Associated Diagnoses: Chronic alcoholic pancreatitis (HCC)    amitriptyline (ELAVIL) 25 MG tablet Take 1 tablet (25 mg total) by mouth at bedtime. Qty: 30 tablet, Refills: 2   Associated Diagnoses: Type 2 diabetes mellitus with peripheral neuropathy (HCC)    BD INSULIN SYRINGE ULTRAFINE 31G X 5/16" 0.3 ML MISC Use to inject insulin into  the skin at bedtime each  day Qty: 90 each, Refills: 1    Blood Glucose Monitoring Suppl (ACCU-CHEK AVIVA PLUS) W/DEVICE KIT Check blood sugar up to 3 times a day Qty: 1 kit, Refills: 1   Associated Diagnoses: Diabetes mellitus due to underlying condition with ketoacidosis without coma, with long-term current use of insulin (HCC)    CREON 36000 units CPEP capsule  Take 1 capsule by mouth 3  times daily before meals Qty: 300 capsule, Refills: 0    diclofenac sodium (VOLTAREN) 1 % GEL Apply 2 g topically 4 (four) times daily. Qty: 100 g, Refills: 1   Associated Diagnoses: Chronic pain    gabapentin (NEURONTIN) 600 MG tablet Take 1,200 mg by mouth 3 (three) times daily.    glucose blood (ACCU-CHEK AVIVA PLUS) test strip Use to check blood sugar 3 to 4 times daily. diag code E11.9. Insulin dependent Qty: 100 each, Refills: 11    Insulin Detemir (LEVEMIR FLEXTOUCH) 100 UNIT/ML Pen Inject 20 units daily before bedtime Qty: 18 mL, Refills: 3   Associated Diagnoses: Type 2 diabetes mellitus with peripheral neuropathy (HCC)     Insulin Pen Needle 31G X 5 MM MISC Use to inject 15 units of Levemir one time a day Qty: 100 each, Refills: 5   Associated Diagnoses: Diabetes mellitus due to underlying condition with ketoacidosis without coma, with long-term current use of insulin (HCC)    Lancet Devices (ACCU-CHEK SOFTCLIX) lancets Use to check your blood sugar 3 times a day. Dx code E11.65. Insulin dependent. Qty: 102 each, Refills: 5   Associated Diagnoses: Type 2 diabetes mellitus with peripheral neuropathy (HCC)    lovastatin (MEVACOR) 40 MG tablet Take 1 tablet by mouth at  bedtime Qty: 90 tablet, Refills: 1    magnesium oxide (MAG-OX) 400 (241.3 Mg) MG tablet Take 1 tablet (400 mg total) by mouth daily. Qty: 90 tablet, Refills: 3    Vitamin D, Ergocalciferol, (DRISDOL) 50000 units CAPS capsule Take 1 capsule (50,000 Units total) by mouth every 7 (seven) days. Qty: 7 capsule, Refills: 6    folic acid (FOLVITE) 1 MG tablet Take 1 tablet by mouth  daily Qty: 90 tablet, Refills: 1    pantoprazole (PROTONIX) 40 MG tablet Take 1 tablet by mouth  daily Qty: 90 tablet, Refills: 1      STOP taking these medications     pregabalin (LYRICA) 75 MG capsule        Allergies  Allergen Reactions  . Ace Inhibitors Swelling    Angioedema - unquestionable  . Losartan Swelling    Pt presented with unquestionable angioedema on ACEI, so aviod ARBs as well      The results of significant diagnostics from this hospitalization (including imaging, microbiology, ancillary and laboratory) are listed below for reference.    Significant Diagnostic Studies: Portable Chest 1 View  Result Date: 09/02/2015 CLINICAL DATA:  Diabetic ketoacidosis. EXAM: PORTABLE CHEST 1 VIEW COMPARISON:  12/07/2014 FINDINGS: Upper limits normal heart size noted. Mild pulmonary vascular congestion noted. There is no evidence of focal airspace disease, pulmonary edema, suspicious pulmonary nodule/mass, pleural effusion, or pneumothorax. No acute  bony abnormalities are identified. IMPRESSION: Mild pulmonary vascular congestion. Electronically Signed   By: Margarette Canada M.D.   On: 09/02/2015 22:43    Microbiology: Recent Results (from the past 240 hour(s))  MRSA PCR Screening     Status: None   Collection Time: 09/02/15  9:56 PM  Result Value Ref Range Status   MRSA by PCR NEGATIVE NEGATIVE Final    Comment:        The GeneXpert MRSA Assay (FDA approved for NASAL specimens only), is one component of a comprehensive MRSA colonization surveillance program. It is not intended to diagnose MRSA infection nor to guide or monitor treatment for MRSA infections.      Labs: Basic Metabolic Panel:  Recent Labs Lab 09/02/15 1735 09/03/15  0424 09/03/15 0550 09/03/15 1510  NA 129* 145  --  140  K 3.9 2.7*  --  4.0  CL 98* 110  --  108  CO2 22 27  --  25  GLUCOSE 1,059* 151*  --  78  BUN <5* <5*  --  <5*  CREATININE 0.99 0.48*  --  0.64  CALCIUM 8.7* 8.6*  --  8.8*  MG  --   --  1.5*  --    Liver Function Tests:  Recent Labs Lab 09/02/15 1735 09/03/15 0424  AST 110* 46*  ALT 86* 66*  ALKPHOS 305* 226*  BILITOT 0.8 0.6  PROT 5.3* 4.8*  ALBUMIN 2.9* 2.6*   No results for input(s): LIPASE, AMYLASE in the last 168 hours.  Recent Labs Lab 09/02/15 1823  AMMONIA 31   CBC:  Recent Labs Lab 09/02/15 1735 09/03/15 0424  WBC 5.9 6.3  HGB 12.3* 11.2*  HCT 38.6* 33.4*  MCV 100.3* 94.4  PLT 141* 137*   Cardiac Enzymes: No results for input(s): CKTOTAL, CKMB, CKMBINDEX, TROPONINI in the last 168 hours. BNP: BNP (last 3 results)  Recent Labs  12/04/14 0945  BNP 32.8    ProBNP (last 3 results) No results for input(s): PROBNP in the last 8760 hours.  CBG:  Recent Labs Lab 09/03/15 0415 09/03/15 0800 09/03/15 0830 09/03/15 1231 09/03/15 1622  GLUCAP 149* 64* 156* 217* 120*       Signed:  Eleonore Chiquito S MD.  [facility] 09/03/2015, 4:32 PM

## 2015-09-03 NOTE — Progress Notes (Addendum)
0000: patient CBG 87. Glucostabilizer was turned off. Baltazar Najjar, NP discontinued glucostabilizer and Q1 CBG orders.   0200: Patient has not urinated. Bladder scan showed 978 cc.  Patient rectal temp 96.8. Baltazar Najjar, NP notified. Bair hugger ordered and applied.    0300: patient was able to urinate without interventions.

## 2015-09-03 NOTE — Patient Outreach (Signed)
Notified that patient is currently hospitalized.  Will followup for medication adherence once patient is discharged.  [name], PharmD [facility] PGY2 Pharmacy Resident [phone]

## 2015-09-03 NOTE — Progress Notes (Signed)
MD made aware via text page - Mag 1.5

## 2015-09-03 NOTE — Progress Notes (Signed)
CRITICAL VALUE ALERT  Critical value received:  Potassium 2.7  Date of notification:  09/03/15  Time of notification:  0556  Critical value read back: YES  Nurse who received alert: Sherryl Manges, RN, BSN  MD notified (1st page):  Baltazar Najjar, NP  Time of first page:  [phone]  MD notified (2nd page):  Time of second page:  Responding MD:    Time MD responded:

## 2015-09-06 ENCOUNTER — Telehealth: Payer: Self-pay | Admitting: Internal Medicine

## 2015-09-06 ENCOUNTER — Telehealth: Payer: Self-pay

## 2015-09-06 DIAGNOSIS — E1142 Type 2 diabetes mellitus with diabetic polyneuropathy: Secondary | ICD-10-CM

## 2015-09-06 DIAGNOSIS — G8929 Other chronic pain: Secondary | ICD-10-CM

## 2015-09-06 DIAGNOSIS — Z794 Long term (current) use of insulin: Secondary | ICD-10-CM

## 2015-09-06 DIAGNOSIS — E0810 Diabetes mellitus due to underlying condition with ketoacidosis without coma: Secondary | ICD-10-CM

## 2015-09-06 MED ORDER — LOVASTATIN 40 MG PO TABS: 40.0000 mg | ORAL_TABLET | Freq: Every day | ORAL | 1 refills | Status: DC

## 2015-09-06 MED ORDER — ACCU-CHEK SOFTCLIX LANCET DEV MISC: 5 refills | Status: DC

## 2015-09-06 MED ORDER — AMITRIPTYLINE HCL 25 MG PO TABS: 25.0000 mg | ORAL_TABLET | Freq: Every day | ORAL | 2 refills | Status: DC

## 2015-09-06 MED ORDER — DICLOFENAC SODIUM 1 % TD GEL: 2.0000 g | Freq: Four times a day (QID) | TRANSDERMAL | 1 refills | Status: DC

## 2015-09-06 MED ORDER — INSULIN PEN NEEDLE 31G X 5 MM MISC: 5 refills | Status: DC

## 2015-09-06 MED ORDER — INSULIN DETEMIR 100 UNIT/ML FLEXPEN: PEN_INJECTOR | SUBCUTANEOUS | 3 refills | Status: DC

## 2015-09-06 MED ORDER — GLUCOSE BLOOD VI STRP: ORAL_STRIP | 11 refills | Status: DC

## 2015-09-06 MED ORDER — FOLIC ACID 1 MG PO TABS: 1.0000 mg | ORAL_TABLET | Freq: Every day | ORAL | 1 refills | Status: DC

## 2015-09-06 MED ORDER — MAGNESIUM OXIDE 400 (241.3 MG) MG PO TABS: 400.0000 mg | ORAL_TABLET | Freq: Every day | ORAL | 3 refills | Status: DC

## 2015-09-06 NOTE — Telephone Encounter (Signed)
rtc to pt, lm for a rtc

## 2015-09-06 NOTE — Telephone Encounter (Signed)
Needs to speak with a nurse regarding meds.  

## 2015-09-06 NOTE — Telephone Encounter (Signed)
   Reason for call:   I received a call from [name] at 9:40 PM indicating he needs refills on several medications.   Pertinent Data:   Reports he needs amitriptyline, voltaren gel, folic acid, levemir, lovastatin, mag-ox, aviva plus test strips, pen needles, and lancets.  He states he needs these medications sent to a different [facility] in [place], [place]. Phone [phone]   Assessment / Plan / Recommendations:   I sent in the prescriptions to the above pharmacy. PCP should discuss to make sure this correct.   Juliet Rude, MD   09/06/2015, 9:46 PM

## 2015-09-07 ENCOUNTER — Other Ambulatory Visit: Payer: Self-pay

## 2015-09-07 ENCOUNTER — Other Ambulatory Visit: Payer: Self-pay | Admitting: Pharmacist

## 2015-09-07 DIAGNOSIS — E1169 Type 2 diabetes mellitus with other specified complication: Secondary | ICD-10-CM

## 2015-09-07 NOTE — Patient Outreach (Signed)
[facility]) Care Management  09/07/2015  Michael [name] Aug 03, 1955 [id number]   60 year old male previously seen by me for medication adherence by filling his pill box.  He was recently hospitalized for hyperosmolar hyperglycemic state likely due to non-adherence with his insulin and possible confusion status post newly starting Lyrica on 08/31/15.  Lyrica was stopped at discharge and patient was sent home on Levemir 20 units daily at bedtime.  Today patient reports adherence to his insulin via telephone and states that his morning blood glucose was 203 mg/dL.  He does report that he did take 20 units of Levemir last night.  Patient was very difficult to hear clearly on the phone when I tried to discuss his other medications and his adherence to them, possibly due to echo or his phone. He states he is still using his pill box but his last pill box was filled more than 2 weeks ago.  Patient also had prescription refills sent to a different [facility] [place], [place].  When I asked why he sent them to this pharmacy he was very mumbled and difficult to understand.   Plan:  -Will plan for pharmacy home visit to assess current medications patient is taking and adherence to his regimen post discharge.  -Counseled patient on importance of adherence to prevent further complications and hospital visits via telephone.  -Will plan on filling 2 weeks of patients medications in his pill boxes  [name], PharmD [facility] PGY2 Pharmacy Resident [phone]

## 2015-09-08 ENCOUNTER — Telehealth: Payer: Self-pay

## 2015-09-08 ENCOUNTER — Other Ambulatory Visit: Payer: Self-pay | Admitting: Pharmacist

## 2015-09-08 DIAGNOSIS — E0810 Diabetes mellitus due to underlying condition with ketoacidosis without coma: Secondary | ICD-10-CM

## 2015-09-08 DIAGNOSIS — Z794 Long term (current) use of insulin: Principal | ICD-10-CM

## 2015-09-08 DIAGNOSIS — E1142 Type 2 diabetes mellitus with diabetic polyneuropathy: Secondary | ICD-10-CM

## 2015-09-08 MED ORDER — ACCU-CHEK AVIVA PLUS W/DEVICE KIT
PACK | 1 refills | Status: DC
Start: 2015-09-08 — End: 2015-11-16

## 2015-09-08 MED ORDER — GLUCOSE BLOOD VI STRP: ORAL_STRIP | 11 refills | Status: DC

## 2015-09-08 MED ORDER — ACCU-CHEK SOFTCLIX LANCET DEV MISC: 5 refills | Status: DC

## 2015-09-08 NOTE — Telephone Encounter (Signed)
Please call pt regarding meds.

## 2015-09-08 NOTE — Telephone Encounter (Signed)
Michael [name] was ask not to change pharmacies again, he was agreeable

## 2015-09-09 ENCOUNTER — Other Ambulatory Visit: Payer: Self-pay | Admitting: *Deleted

## 2015-09-09 NOTE — Patient Outreach (Signed)
[facility]) Care Management [place] Telephone Outreach, Transition of Care attempt #1 09/09/2015  Michael [name] 07-20-1955 [id number]   Unsuccessful telephone outreach to Michael [name], 60 y/o male, referred to [facility] for transition of care after IP hospital visit August 10-11, 2017 for AMS and uncontrolled DM; patient was treated with IV fluids/ insulin during his IP hospital visit.  Patient has history of HTN, DM, ETOH abuse with chronic pancreatitis and abdominal pain.  From review of EMR, apparently patient has history of noncompliance with his medication adherence as well; [place] referral has been also been made.  HIPAA compliant voice mail message left for patient, asking him to return my call; details of my direct contact information was left on voice mail that I left for patient.  Plan:  Will re-attempt [place] telephone outreach for transition of care tomorrow if I do not hear back from patient before then.  [name], RN, BSN, [employer] [facility] Care Management  [phone]

## 2015-09-10 ENCOUNTER — Other Ambulatory Visit: Payer: Self-pay | Admitting: *Deleted

## 2015-09-10 ENCOUNTER — Ambulatory Visit: Payer: Self-pay | Admitting: *Deleted

## 2015-09-10 NOTE — Patient Outreach (Signed)
[facility]) Care Management [place] Telephone Outreach, Transition of Care, Attempt #2 09/10/2015  PRESIDENT SCURTI [birth year] [id number]  Unsuccessful telephone outreach to Michael [name], 60 y/o male, referred to [facility] for transition of care after IP hospital visit August 10-11, 2017 for AMS and uncontrolled DM; patient was treated with IV fluids/ insulin during his IP hospital visit.  Patient has history of HTN, DM, ETOH abuse with chronic pancreatitis and abdominal pain.  From review of EMR, apparently patient has history of noncompliance with his medication adherence as well; [place] referral has been also been made.  HIPAA compliant voice mail message left for patient, asking him to return my call; details of my direct contact information was left on voice mail that I left for patient.  Plan:  Will re-attempt [place] telephone outreach for transition of care next week if I do not hear back from patient before then.  Michael Rack, RN, BSN, [employer] [facility] Care Management  [phone]

## 2015-09-13 ENCOUNTER — Ambulatory Visit: Payer: Self-pay | Admitting: *Deleted

## 2015-09-13 ENCOUNTER — Other Ambulatory Visit: Payer: Self-pay | Admitting: *Deleted

## 2015-09-13 ENCOUNTER — Encounter: Payer: Self-pay | Admitting: *Deleted

## 2015-09-13 NOTE — Patient Outreach (Signed)
[facility]) Care Management [place] Telephone Outreach, Transition of Care day 1 (third attempt) 09/13/2015  DANIELS THOMA [birth year] [id number]   Successful telephone outreach to Carter Kitten, 60 y/o male, referred to [facility] for transition of care afterIP hospital visit August 10-11, 2017 for AMS and uncontrolled DM; patient was treated with IV fluids/ insulin during his IP hospital visit. Patient has history of HTN, DM, ETOH abuse with chronic pancreatitis and abdominal pain. From review of EMR, apparently patient has history of noncompliance with his medication adherence as well; [place] referral has been also been made.  HIPAA verified with patient during today's phone call.  Patient requests that phone calls be directed to his cell number, [phone].  Today, patient reports that he has been "doing just fine," since his hospital discharge, and that he has all of his medications except for Magnesium, which he reports "is to be delivered to me" through his pharmacy.  Patient states that he has been checking with his pharmacy about this medication and assures me that this medication "is going to be delivered, probably today."  Patient reports taking all of his medications as they are prescribed.  Patient states that he has a follow up PCP appointment scheduled for Friday, September 17, 2015, and states that his transportation will be provided through "social services," which he "always uses," adding that he plans to call social services today to confirm that they will provide his transportation to this scheduled provider appointment.  Patient states that he has been monitoring and recording his blood sugars three times every day, and that they have "been running in the 200-210" range.  I encouraged patient to continue this practice for review during future [facility] CM in-home visit, and he agreed to do so.    [place] services discussed with  patient today, and he verbally agreed to [place] participation.  I provided patient with my direct phone number, the 24-hour nurse phone line, and the main [facility] CM phone numbers.  Patient was unable to schedule [facility] CM in-home visit today, and we verbally contracted that we would make a home visit appointment during [place] telephone outreach next week.  I encouraged patient to continue taking his medications as they are prescribed and to attend all scheduled provider appointments, which he agreed to do.  I also encouraged patient to promptly contact his providers for any new concerns, issues, or problems that arise, and he agreed to do so.  Plan:  Mr. Haag will take his medications as they are prescribed and will attend all scheduled provider appointments.  Mr. Karau will continue monitoring and recording his blood sugar reading three times every day.  Mr. Orsak will work with [place] to have his needs around medication compliance/ adherence/ assistance addressed.  Mr. Pense will promptly contact his providers for any new concerns, issues, or problems.  [place] outreach for transition of care after recent hospital visit to continue with scheduled telephone outreach next week.   [name], RN, BSN, [employer] [facility] Care Management  [phone]

## 2015-09-14 NOTE — Patient Outreach (Signed)
[facility] [place] [facility]) Care Management  [place]  09/08/15  Michael [name] Oct 01, 1955 [id number]  Subjective: 60 year old male previously seen by me for medication adherence with assistance filling his pill box.  He was recently hospitalized for hyperosmolar hyperglycemic state likely due to non-adherence with his insulin and possible confusion in which was attributed to newly starting Lyrica. Patient now reports that he has never taken Lyrica and was able to prove this with a new prescription that has never been filled.    Patient reports adherence to his medications and states that he has been taking his Levemir 20 units every day since he was discharged.  Upon review of his pill box he has not taken 5/14 days of his pill box and it was last filled around 3 weeks ago with the time being off due to his hospitalization. He does state that sometimes varies when he takes his daily insulin dose. Patient has previously stated that he cannot read his medication bottles due to needing reading glasses.   He is currently out of his Accu Chek strips and his insurance will not pay for the refill until 10/04/15.  He has recently switched his medications to [place] due to him walking to the local pharmacy and the medications not being ready. Patient denies signs or symptoms of hypoglycemia.   Since I last filled his pill box he has restarted taking Amitriptyline 150 mg at bedtime without using his pill box which makes him very drowsy.  He previously had less drowsiness when the Amitryptyline was split 75 mg twice daily.    Self Monitored Blood Glucose 8/16 1233 pm 328 mg/dL (states he ate 1/2 a cookie this morning) 8/15 244 pm 132 mg/dL  8/13 930 am 122 mg/dL 8/12 543 pm Hi   Objective:   Encounter Medications: Outpatient Encounter Prescriptions as of 09/08/2015  Medication Sig  . CREON 36000 units CPEP capsule Take 1 capsule by mouth 3  times daily before meals  . diclofenac  sodium (VOLTAREN) 1 % GEL Apply 2 g topically 4 (four) times daily.  . folic acid (FOLVITE) 1 MG tablet Take 1 tablet (1 mg total) by mouth daily.  Marland Kitchen gabapentin (NEURONTIN) 600 MG tablet Take 900-1,200 mg by mouth 3 (three) times daily.   . Insulin Detemir (LEVEMIR FLEXTOUCH) 100 UNIT/ML Pen Inject 20 units daily before bedtime  . Insulin Pen Needle 31G X 5 MM MISC Use to inject 15 units of Levemir one time a day  . lovastatin (MEVACOR) 40 MG tablet Take 1 tablet (40 mg total) by mouth at bedtime.  . Vitamin D, Ergocalciferol, (DRISDOL) 50000 units CAPS capsule Take 1 capsule (50,000 Units total) by mouth every 7 (seven) days.  Marland Kitchen ACCU-CHEK FASTCLIX LANCETS MISC Use to check your blood sugar 3 times a day (Patient not taking: Reported on 09/14/2015)  . magnesium oxide (MAG-OX) 400 (241.3 Mg) MG tablet Take 1 tablet (400 mg total) by mouth daily. (Patient not taking: Reported on 09/14/2015)  . pantoprazole (PROTONIX) 40 MG tablet Take 1 tablet by mouth  daily (Patient not taking: Reported on 09/03/2015)  . [DISCONTINUED] amitriptyline (ELAVIL) 25 MG tablet Take 1 tablet (25 mg total) by mouth at bedtime.  . [DISCONTINUED] BD INSULIN SYRINGE ULTRAFINE 31G X 5/16" 0.3 ML MISC Use to inject insulin into  the skin at bedtime each  day (Patient not taking: Reported on 09/14/2015)  . [DISCONTINUED] Blood Glucose Monitoring Suppl (ACCU-CHEK AVIVA PLUS) W/DEVICE KIT Check blood  sugar up to 3 times a day  . [DISCONTINUED] glucose blood (ACCU-CHEK AVIVA PLUS) test strip Use to check blood sugar 3 to 4 times daily. diag code E11.9. Insulin dependent  . [DISCONTINUED] Lancet Devices (ACCU-CHEK SOFTCLIX) lancets Use to check your blood sugar 3 times a day. Dx code E11.65. Insulin dependent.   No facility-administered encounter medications on file as of 09/08/2015.     Functional Status: In your present state of health, do you have any difficulty performing the following activities: 09/03/2015 08/12/2015  Hearing? N N   Vision? N Y  Difficulty concentrating or making decisions? N Y  Walking or climbing stairs? N Y  Dressing or bathing? N N  Doing errands, shopping? Y Y  Conservation officer, nature and eating ? - N  Using the Toilet? - N  In the past six months, have you accidently leaked urine? - N  Do you have problems with loss of bowel control? - N  Managing your Medications? - Y  Managing your Finances? - N  Housekeeping or managing your Housekeeping? - N  Some recent data might be hidden    Fall/Depression Screening: PHQ 2/9 Scores 08/02/2015 07/05/2015 03/12/2015 01/28/2015 12/24/2014 11/25/2014 11/17/2014  PHQ - 2 Score 0 0 0 0 0 0 0  PHQ- 9 Score - - - - - - -    Assessment: Drugs sorted by system:  Neurologic/Psychologic: amitriptyline  Cardiovascular: lovastatin  Gastrointestinal: Creon, pantoprazole  Endocrine: Levemir  Topical: diclofenac  Pain:gabapentin  Vitamins/Minerals: folic acid, magnesium oxide, vitamin D  Medications to avoid in the elderly:amitriptyline Other issues noted:Patient currently on amitriptyline 75 mg BID (prescribed 150 mg QHS but takes 75 mg BID due to excess drowsiness).  Patient never started prescription for Lyrica.    Plan: -Discussed important of adherence and plan to teach patient how to fill his pill box at the next visit once patient shows that he can be adherent to his regimen. Discussed adherence with patients sister and she plans to help him with his medications.  Patient will put an "x" beside the date where he is writing down his blood glucose on the days that he takes his Levemir.  -Provided patient with +2.00 strength reading glasses  -Will follow-up with patients primary care physician about amitriptyline 75 mg BID as this is how he has been taking the medication from a previous prescription from [place] ([facility]) -Due to Accu-Chek strips not being approved for a refill until 10/04/15, provided patient Contour  Next One meter and 40 test strips to use temporarily.  -Filled 1 week supply of medications in weekly pill box -Will followup in 1 week to assess adherence and show patient how to refill a 2 week supply of medications in weekly pill boxes  [name], PharmD [facility] PGY2 Pharmacy Resident [phone]  [facility] CM Care Plan Problem One   Flowsheet Row Most Recent Value  Care Plan Problem One  Non Adherence to Medications as evidenced by pill box at home visits  Role Documenting the Problem One  [place] for Problem One  Active  [facility] CM Short Term Goal #1 (0-30 days)  Patient will utilize his pill box 90% of the time as evidenced by pharmacist review of remaining medications in pill box over the next week  [facility] CM Short Term Goal #1 Start Date  08/12/15  Interventions for Short Term Goal #1  Pharmacist will fill pill boxes and will teach patient how to fill  his own pill box.  Education provided on need for proper adherence and proper storage of medications.  Counseled on importance of adherence to his regimen and taking his medications as prescribed to prevent furture ED visits and hospitalizations.  plan to teach patient how to fill his pill box at the next visit once patient shows that he can be adherent to his regimen. Discussed adherence with patients sister and she plans to help him with his medications.  Patient will put an "x" beside the date where he is writing down his blood glucose on the days that he takes his Levemir.

## 2015-09-15 ENCOUNTER — Other Ambulatory Visit: Payer: Self-pay | Admitting: Pharmacist

## 2015-09-15 ENCOUNTER — Other Ambulatory Visit: Payer: Self-pay | Admitting: Internal Medicine

## 2015-09-15 ENCOUNTER — Other Ambulatory Visit: Payer: Self-pay | Admitting: Licensed Clinical Social Worker

## 2015-09-15 DIAGNOSIS — E1142 Type 2 diabetes mellitus with diabetic polyneuropathy: Secondary | ICD-10-CM

## 2015-09-15 DIAGNOSIS — Z794 Long term (current) use of insulin: Principal | ICD-10-CM

## 2015-09-15 DIAGNOSIS — E0810 Diabetes mellitus due to underlying condition with ketoacidosis without coma: Secondary | ICD-10-CM

## 2015-09-15 NOTE — Patient Outreach (Signed)
[place]) Care Management  [place]   09/15/2015  Michael [name] [birth year] [id number]  Subjective: 60 year old male previously seen by me for medication adherence with assistance filling his pill box. He was recently hospitalized for hyperosmolar hyperglycemic state likely due to non-adherence with his insulin.  He has a prescription for Gabapentin and Amitriptyline which he receives from [address] with [facility] His amitriptyline is written for 150 mg at bedtime but he states that this has previously made him too drowsy and he is currently taking it 75 mg twice daily. Patient denies drowsiness since taking 75 mg twice daily. He states that his neuropathy has improved but he does have the pain occasionally.   Denies hypoglycemia and states proper treatment.    Patient has taken his insulin every day form 8/16 to 8/22.  He has been 100% compliant with his medications from his pill box from 8/16 to 8/22.   Self Monitored Blood Glucose from 8/16 to 8/22 (checks fasting, before lunch and before dinner) Fasting Blood glucose: 328, 83,, 163, 133, 251, 89, 333, 237 Before lunch: 349, 150, 232, 284, 543 Before dinner: 255, 263, 236, 230, 530  He states the 543 mg/dL blood glucose yesterday was due to eating a piece of chocolate cake.    Patient tried calling for transportation for his Friday appointment with [facility].  He missed the three day deadline with [employer]   Objective:   Encounter Medications: Outpatient Encounter Prescriptions as of 09/15/2015  Medication Sig  . CREON 36000 units CPEP capsule Take 1 capsule by mouth 3  times daily before meals  . diclofenac sodium (VOLTAREN) 1 % GEL Apply 2 g topically 4 (four) times daily.  . folic acid (FOLVITE) 1 MG tablet Take 1 tablet (1 mg total) by mouth daily.  Marland Kitchen gabapentin (NEURONTIN) 600 MG tablet Take 900-1,200 mg by mouth 3 (three)  times daily.   . Insulin Detemir (LEVEMIR FLEXTOUCH) 100 UNIT/ML Pen Inject 20 units daily before bedtime  . Insulin Pen Needle 31G X 5 MM MISC Use to inject 15 units of Levemir one time a day  . Lancet Devices (ACCU-CHEK SOFTCLIX) lancets Use to check your blood sugar 3 times a day. Dx code E11.65. Insulin dependent.  . lovastatin (MEVACOR) 40 MG tablet Take 1 tablet (40 mg total) by mouth at bedtime.  . pantoprazole (PROTONIX) 40 MG tablet Take 1 tablet by mouth  daily  . Vitamin D, Ergocalciferol, (DRISDOL) 50000 units CAPS capsule Take 1 capsule (50,000 Units total) by mouth every 7 (seven) days.  Marland Kitchen ACCU-CHEK FASTCLIX LANCETS MISC Use to check your blood sugar 3 times a day (Patient not taking: Reported on 09/14/2015)  . Blood Glucose Monitoring Suppl (ACCU-CHEK AVIVA PLUS) w/Device KIT Check blood sugar up to 3 times a day (Patient not taking: Reported on 09/14/2015)  . glucose blood (ACCU-CHEK AVIVA PLUS) test strip Use to check blood sugar 3 to 4 times daily. diag code E11.9. Insulin dependent (Patient not taking: Reported on 09/14/2015)  . magnesium oxide (MAG-OX) 400 (241.3 Mg) MG tablet Take 1 tablet (400 mg total) by mouth daily. (Patient not taking: Reported on 09/14/2015)   No facility-administered encounter medications on file as of 09/15/2015.     Functional Status: In your present state of health, do you have any difficulty performing the following activities: 09/03/2015 08/12/2015  Hearing? N N  Vision? N Y  Difficulty concentrating or making decisions?  N Y  Walking or climbing stairs? N Y  Dressing or bathing? N N  Doing errands, shopping? Y Y  Conservation officer, nature and eating ? - N  Using the Toilet? - N  In the past six months, have you accidently leaked urine? - N  Do you have problems with loss of bowel control? - N  Managing your Medications? - Y  Managing your Finances? - N  Housekeeping or managing your Housekeeping? - N  Some recent data might be hidden    Fall/Depression  Screening: PHQ 2/9 Scores 08/02/2015 07/05/2015 03/12/2015 01/28/2015 12/24/2014 11/25/2014 11/17/2014  PHQ - 2 Score 0 0 0 0 0 0 0  PHQ- 9 Score - - - - - - -    Assessment: Drugs sorted by system:  Neurologic/Psychologic: amitriptyline  Cardiovascular: lovastatin  Gastrointestinal: Creon, pantoprazole  Endocrine: Levemir 20 units daily  Topical: diclofenac  Pain:gabapentin  Vitamins/Minerals: folic acid, magnesium oxide, vitamin D  Medications to avoid in the elderly:amitriptyline Other issues noted:Patient currently on amitriptyline 75 mg BID (prescribed 150 mg QHS but takes 75 mg BID due to excess drowsiness). Patient is currently out of folic acid (filled 5 days in his pill box), and magnesium oxide.    Medication Adherence: Patient has been 100% adherent to his Levemir and pill box over the past week.   Plan: -Discussed important of adherence and plan to teach patient how to fill his pill box at the next visit once patient shows that he can be adherent to his regimen. Discussed adherence with patients sister and she plans to continue to help him with his medications.   -Due to Accu-Chek strips not being approved for a refill until 10/04/15, provided patient Contour Next One meter and 40 test strips to use temporarily.  -Filled 2week supply of medications in weekly pill box -Will followup in 2weekto assess adherence and show patient how to refill a 2 week supply of medications in weekly pill boxes -Will followup for refills of Vitamin D ([facility]), and Magnesium Oxide prior to filling his next pill box -Will followup with [place] to see when his mail order medications will arrive -Referral was placed with [facility] Social Work to assist with transportation to his appointment on Friday with [place], PharmD [facility] PGY2 Pharmacy Resident [phone]  [facility] CM Care Plan Problem One   Flowsheet Row Most  Recent Value  Care Plan Problem One  Non Adherence to Medications as evidenced by pill box at home visits  Role Documenting the Problem One  [place] for Problem One  Active  [facility] CM Short Term Goal #1 (0-30 days)  Patient will utilize his pill box 90% of the time as evidenced by pharmacist review of remaining medications in pill box over the next week  [facility] CM Short Term Goal #1 Start Date  08/12/15  Interventions for Short Term Goal #1  Pharmacist will fill pill boxes and will teach patient how to fill his own pill box.  Education provided on need for proper adherence and proper storage of medications.  Counseled on importance of adherence to his regimen and taking his medications as prescribed to prevent furture ED visits and hospitalizations.  plan to teach patient how to fill his pill box at the next visit once patient shows that he can be adherent to his regimen. Discussed adherence with patients sister and she plans to help him with his medications.  Patient  will put an "x" beside the date where he is writing down his blood glucose on the days that he takes his Levemir.

## 2015-09-15 NOTE — Patient Outreach (Signed)
[facility]) Care Management  09/15/2015  HAYNES GIANNOTTI 1955/09/09 [id number]   Assessment- CSW received referral from [place]. CSW received call from [place] who informed this CSW that patient is need of stable transportation to PCP appointment on Friday at Internal Medicine and that he missed the 3 day deadline in order to use [facility] services. Patient has already contacted [facility] and was informed by the supervisor that there can be no exceptions.   CSW contacted [place] and discussed case. CSW received verbal permission to assist patient with transportation to this appointment through [employer].  CSW contacted [place] and provided updates. [facility] Pharmacist denied any further social work needs.  CSW contacted patient. Patient answered. CSW introduced self, reason for call and of [facility] social work services. HIPPA verifications provided. Patient had a recent hospitalization for hyperosmolar hyperglycemic state likely due to non-adherence with his insulin. Patient was informed that transportation by [employer] will be provided and paid for both to and from his appointment on 09/17/15 through [facility]. Patient expressed understanding. Patient was strongly encouraged to schedule all transportation arrangements three days prior to his next scheduled appointment. Patient informed that this will be a one time exception and that he is expected to use stable transportation through [facility] and Medicaid for all other appointments. Patient denies any further social work needs and is Patent attorney of social work assistance.  CSW will send [place] Management Assistant information on appointment so that this can be arranged. Patient will receive a call.  Plan-CSW will not open case at this time since all social work needs have been met and stable transportation will be provided to upcoming medical appointment with PCP.  Eula Fried, BSW, MSW, [email or web address] Phone: [phone] Fax: [phone]

## 2015-09-16 ENCOUNTER — Telehealth: Payer: Self-pay | Admitting: Internal Medicine

## 2015-09-16 NOTE — Telephone Encounter (Signed)
APT. REMINDER CALL, LMTCB °

## 2015-09-17 ENCOUNTER — Ambulatory Visit: Payer: Medicaid Other | Admitting: Pharmacist

## 2015-09-17 ENCOUNTER — Encounter: Payer: Self-pay | Admitting: Internal Medicine

## 2015-09-17 ENCOUNTER — Ambulatory Visit (INDEPENDENT_AMBULATORY_CARE_PROVIDER_SITE_OTHER): Payer: Medicare Other | Admitting: Internal Medicine

## 2015-09-17 VITALS — BP 133/77 | HR 93 | Temp 98.0°F | Ht 64.0 in | Wt 153.9 lb

## 2015-09-17 DIAGNOSIS — Z79899 Other long term (current) drug therapy: Secondary | ICD-10-CM

## 2015-09-17 DIAGNOSIS — E1165 Type 2 diabetes mellitus with hyperglycemia: Secondary | ICD-10-CM | POA: Diagnosis not present

## 2015-09-17 DIAGNOSIS — Z794 Long term (current) use of insulin: Secondary | ICD-10-CM

## 2015-09-17 DIAGNOSIS — M4692 Unspecified inflammatory spondylopathy, cervical region: Secondary | ICD-10-CM

## 2015-09-17 DIAGNOSIS — E1142 Type 2 diabetes mellitus with diabetic polyneuropathy: Secondary | ICD-10-CM

## 2015-09-17 DIAGNOSIS — K860 Alcohol-induced chronic pancreatitis: Secondary | ICD-10-CM | POA: Diagnosis not present

## 2015-09-17 DIAGNOSIS — F1021 Alcohol dependence, in remission: Secondary | ICD-10-CM

## 2015-09-17 DIAGNOSIS — IMO0002 Reserved for concepts with insufficient information to code with codable children: Secondary | ICD-10-CM

## 2015-09-17 DIAGNOSIS — M47812 Spondylosis without myelopathy or radiculopathy, cervical region: Secondary | ICD-10-CM

## 2015-09-17 NOTE — Patient Instructions (Signed)
[name] it was a pleasure to see you today. We will change a few things today to see I they can help control your diabetes and your symptoms with pain and diarrhea.  Meloxicam 15mg  once daily - an antiinflammatory pain medicine to take once daily for 2 weeks  Loperamide - 2mg  taken after each meal or each bowel movement up to 16mg  daily maximum  Increase your insulin to 24 units nightly  I will send a new prescription for a higher dose of amitriptyline to take daily   I would like to see you again in 2-3 weeks to see how we are doing with these changes

## 2015-09-18 ENCOUNTER — Telehealth: Payer: Self-pay | Admitting: Internal Medicine

## 2015-09-18 ENCOUNTER — Other Ambulatory Visit: Payer: Self-pay | Admitting: Internal Medicine

## 2015-09-18 DIAGNOSIS — E0810 Diabetes mellitus due to underlying condition with ketoacidosis without coma: Secondary | ICD-10-CM

## 2015-09-18 DIAGNOSIS — E1142 Type 2 diabetes mellitus with diabetic polyneuropathy: Secondary | ICD-10-CM

## 2015-09-18 DIAGNOSIS — Z794 Long term (current) use of insulin: Principal | ICD-10-CM

## 2015-09-18 MED ORDER — GLUCOSE BLOOD VI STRP
ORAL_STRIP | 3 refills | Status: DC
Start: 2015-09-18 — End: 2015-10-01

## 2015-09-18 MED ORDER — MELOXICAM 15 MG PO TABS: 15.0000 mg | ORAL_TABLET | Freq: Every day | ORAL | 0 refills | Status: DC

## 2015-09-18 MED ORDER — LOPERAMIDE HCL 2 MG PO CAPS: 2.0000 mg | ORAL_CAPSULE | ORAL | 1 refills | Status: DC | PRN

## 2015-09-18 MED ORDER — AMITRIPTYLINE HCL 50 MG PO TABS: 50.0000 mg | ORAL_TABLET | Freq: Every day | ORAL | 0 refills | Status: DC

## 2015-09-18 NOTE — Telephone Encounter (Signed)
   Reason for call:   I received a call from [name] at 150 PM indicating he had not received his refills.   Pertinent Data:   Due to the speed of his speech, it was difficult for me to completely understand what he needed, but I confirmed that he needed refills of three medications and test strips which he discussed with his PCP yesterday.  He would like for me to send prescriptions to [facility] @ [address].   Assessment / Plan / Recommendations:   I will send over a 14-day refill of meloxicam 15mg  and 30-day refill of loperamide to be taken 2mg  after each meal or each bowel movement up to 16mg  daily.  Per the system, he was last on amitryptilne 25mg  at bedtime.I assume that the dose increment will be to 50mg  at bedtime and will send over a 30-day refill.  As always, pt is advised that if symptoms worsen or new symptoms arise, they should go to an urgent care facility or to to ER for further evaluation.   Michael Dubin, MD   09/18/2015, 3:27 PM

## 2015-09-20 ENCOUNTER — Encounter: Payer: Self-pay | Admitting: Licensed Clinical Social Worker

## 2015-09-20 ENCOUNTER — Other Ambulatory Visit: Payer: Self-pay | Admitting: *Deleted

## 2015-09-20 NOTE — Assessment & Plan Note (Deleted)
A: His diabetic control is improving with additional supervision at home and he reports missing no doses over the past 2 weeks. He brought a written blood glucose log showing TID testing with a range of values 84-HI. These are mostly in the 200-500s ranges though and he does report a very inconsistent diet including foods like snack cakes. HE denies any episodes of symptomatic hypoglycemia.  P: Recommended he try to reduced refined sugar intake Continue TID monitoring Increase daily insulin to 22U RTC in 2-3 weeks

## 2015-09-20 NOTE — Telephone Encounter (Signed)
Pt called Sat 08/26 to inform on-call MD that his rxs had not been called into pharmacy (see phone note).  MD sent in rx for amitriptyline, therefore this request is for additional refills. Regenia Skeeter, Darlene Cassady8/28/20178:52 AM

## 2015-09-20 NOTE — Assessment & Plan Note (Signed)
He continues to have diffuse aches of moderate severity. He has had some benefit with NSAIDs for this. Will prescribe mobic 15mg .

## 2015-09-20 NOTE — Progress Notes (Signed)
   CC: Follow up for hospitalization with diabetic ketoacidosis  HPI:  [name] is a 60 y.o. man with diabetes and chronic pancreatitis who was seen at the hospital 2 weeks ago with hyperglycemia >1000 leading to altered mental status and abdominal pain. This was related to inconsistent use of his insulin, he reported missing about 3-4 days of the week. Since discharge he has been visited by a pharmacist routinely and is documenting his blood sugar readings 3 times daily. He is doing well over this time, although he continues to have diarrhea and joint pain in his shoulders and knees that he says are chronic problems.  See problem based assessment and plan below for additional details.  Past Medical History:  Diagnosis Date  . Diabetes mellitus without complication ([place])   . Pancreatitis   . Seizures ([place])     Review of Systems:  Review of Systems  Constitutional: Negative for weight loss.  Eyes: Negative for blurred vision.  Respiratory: Negative for shortness of breath.   Cardiovascular: Negative for chest pain and leg swelling.  Gastrointestinal: Positive for diarrhea. Negative for blood in stool and melena.  Genitourinary: Negative for frequency.  Musculoskeletal: Positive for joint pain.  Skin: Negative for rash.  Neurological: Negative for seizures and headaches.  Endo/Heme/Allergies: Negative for polydipsia.  Psychiatric/Behavioral: Negative for substance abuse.     Physical Exam:  Vitals:   09/17/15 1401  BP: 133/77  Pulse: 93  Temp: 98 F (36.7 C)  TempSrc: Oral  SpO2: 100%  Weight: 153 lb 14.4 oz (69.8 kg)  Height: 5\' 4"  (1.626 m)   GENERAL- alert, co-operative, NAD HEENT- Atraumatic, PERRL, oral mucosa appears moist CARDIAC- RRR, no murmurs, rubs or gallops. RESP- CTAB, no wheezes or crackles. ABDOMEN- Mild diffuse tenderness, no guarding or rebound BACK- Normal curvature, no paraspinal tenderness, no CVA tenderness. NEURO- Strength upper and  lower extremities- 5/5, Sensation intact globally EXTREMITIES- No pedal edema, no foot ulceration or heavy calluses SKIN- Warm, dry, No rash or lesion. PSYCH- Normal mood and affect, appropriate thought content and speech.    Assessment & Plan:   See Encounters Tab for problem based charting.  Patient discussed with Dr. Daryll Drown

## 2015-09-20 NOTE — Progress Notes (Signed)
CSW met with Mr. Kerstetter prior to his scheduled [facility] appointment.  Pt discussed current services with [facility].  Mr. Oommen discussed how beneficial services have been for him and is pleased.  Pt states home environment is going well, he and his sister have worked on their relationship.  CSW concerned regarding pt's need to utilized [facility] assisted transportation for today's appointment.  Mr. Marxen had difficulty getting through to [facility] transportation.  Pt informed with [place] provides transportation through [employer].  CSW provided Mr. Shear with brochure from [facility] HMO/SNP regarding transportation services.

## 2015-09-20 NOTE — Patient Outreach (Signed)
[facility]) Care Management [place] Telephone Outreach, Transition of Care, day 8 09/20/2015  [name] [birth year] [id number]  Successful telephone outreach to Michael [name], 60 y/o male, referred to [employer] for transition of care afterIP hospital visit August 10-11, 2017 for AMS and uncontrolled DM; patient was treated with IV fluids/ insulin during his IP hospital visit. Patient has history of HTN, DM, ETOH abuse with chronic pancreatitis and abdominal pain. From review of EMR, apparently patient has history of noncompliance with his medication adherence as well; [place] referral has been also been made.  HIPAA verified.  Today, patient continues to report that he has been "doing just fine," since his hospital discharge, and that he has all of his medications and is taking as they are prescribed.  Patient acknowledges that he has spoken to Joseph Berkshire, [facility] pharmacist, about this medication needs.  Unable to perform formal medication reconciliation today, as patient is speaking very fast, and is difficult to understand over the phone.  Confirmed from EMR review that [name] is actively working with Mr. [name] on his stated medication needs.  Patient reports today that he attended a follow up PCP appointment scheduled for Friday, September 17, 2015, and said that he had worked with [place], Eula Fried on obtaining transportation for that appointment.  However, during our conversation, [name] said that "his niece" took him, and then later in the conversation said that "a cab" took him to that appointment.  [name] again said that he usually obtains his transportation to and from provider appointments by using "social services."  I encouraged [name] to contact his transportation services early for any needs he has to get to his provider appointments, and he agreed to do so.    [name] asked me today for [facility] CSW [place] phone number, as he  voiced some questions about "getting in with the food pantry."  I provided Mr. Derricks with [name]'s phone number, and then called [name] to let her know to expect a call from the patient about his questions.  Patient continues to report that he has been monitoring and recording his blood sugars three times every day, and that they have "been running in the 200-210" range.  I encouraged patient to continue this practice for review during future [facility] CM in-home visit, and he agreed to do so.    [name] denied further concerns, needs, problems or issues today, and I confirmed that patient had my direct phone number, the 24-hour nurse phone line, and the main [facility] CM phone numbers.  Today we scheduled [facility] CM in-home visit for next month.  Plan:  [name] will take his medications as they are prescribed and will attend all scheduled provider appointments.  [name] will continue monitoring and recording his blood sugar reading three times every day.  [name] will work with [place] to have his needs around medication compliance/ adherence/ assistance addressed.  [name] will promptly contact his providers for any new concerns, issues, or problems.  [place] outreach for transition of care after recent hospital visit to continue with scheduled telephone outreach next week and in-home visit for next month.  [name], RN, BSN, [employer] [facility] Care Management  [phone]

## 2015-09-20 NOTE — Assessment & Plan Note (Signed)
He is having diarrhea regularly worsened when eating meals with mild accompanying abdominal pain. Overall though he has continued to gain weight suggesting improvement with his insulin and creon treatments. I suspect this somewhat chronic diarrhea is related to pancreatic insufficiency rather than infectious.  Loperamide 2mg  PRN for diarrhea up to 8/day Recheck in 2-3 weeks

## 2015-09-20 NOTE — Assessment & Plan Note (Signed)
A: His diabetic control is improving with additional supervision at home and he reports missing no doses over the past 2 weeks. He brought a written blood glucose log showing TID testing with a range of values 84-HI. These are mostly in the 200-500s ranges though and he does report a very inconsistent diet including foods like snack cakes. HE denies any episodes of symptomatic hypoglycemia.  P: Recommended he try to reduced refined sugar intake Continue TID monitoring Increase daily insulin to 22U Increase amitriptyline to 50mg  daily for peripheral neuropathy RTC in 2-3 weeks

## 2015-09-21 ENCOUNTER — Ambulatory Visit: Payer: Medicaid Other

## 2015-09-22 NOTE — Progress Notes (Signed)
S: Michael [name] is a 60 y.o. male reports to clinical for hospital follow up.   Allergies  Allergen Reactions  . Ace Inhibitors Swelling    Angioedema - unquestionable  . Losartan Swelling    Pt presented with unquestionable angioedema on ACEI, so aviod ARBs as well   Past Medical History:  Diagnosis Date  . Diabetes mellitus without complication ([place])   . Pancreatitis   . Seizures [facility])    Social History   Social History  . Marital status: Single    Spouse name: N/A  . Number of children: 0  . Years of education: 31   Occupational History  .    [employer]    worked for 6 years  . 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 Smoking status: Current Every Day Smoker    Packs/day: 0.40    Years: 40.00    Types: Cigarettes  . Smokeless tobacco: Never Used     Comment: 7-8CIGARETTES  . Alcohol use No     Comment: 11/17/2014 "was drinking 2-5 drinks; stopped ~ 4-5 months"  . Drug use: No     Comment: 11/17/2014 "maybe once/month"  . Sexual activity: Not on file   Other Topics Concern  . Not on file   Social History Narrative   Has been living at 3 different friend's homes since discharge 01/2011. Was living in the shelter prior to admission. Had previously lived with his niece and other family members but he has gotten kicked out each time.    Family History  Problem Relation Age of Onset  . Hypertension Sister   . Diabetes Sister   . Heart disease Sister   . Colon cancer Neg Hx   . Stomach cancer Neg Hx   . Anesthesia problems Neg Hx   . Hypotension Neg Hx   . Pseudochol deficiency Neg Hx   . Malignant hyperthermia Neg Hx     O:    Component Value Date/Time   CHOL 157 01/19/2014 1338   HDL 48 01/19/2014 1338   TRIG 154 (H) 01/19/2014 1338   AST 46 (H) 09/03/2015 0424   ALT 66 (H) 09/03/2015 0424   NA 140 09/03/2015 1510   K 4.0 09/03/2015 1510   CL 108 09/03/2015 1510   CO2 25 09/03/2015  1510   GLUCOSE 78 09/03/2015 1510   HGBA1C 9.6 07/05/2015 1452   HGBA1C 13.8 (H) 12/04/2014 0945   BUN <5 (L) 09/03/2015 1510   CREATININE 0.64 09/03/2015 1510   CREATININE 0.61 08/02/2011 1552   CALCIUM 8.8 (L) 09/03/2015 1510   GFRNONAA >60 09/03/2015 1510   GFRAA >60 09/03/2015 1510   GFRAA >89 08/02/2011 1552   WBC 6.3 09/03/2015 0424   HGB 11.2 (L) 09/03/2015 0424   HCT 33.4 (L) 09/03/2015 0424   PLT 137 (L) 09/03/2015 0424   TSH 1.731 08/02/2011 1552   Ht Readings from Last 2 Encounters:  09/17/15 5\' 4"  (1.626 m)  09/02/15 5\' 4"  (1.626 m)   Wt Readings from Last 2 Encounters:  09/17/15 153 lb 14.4 oz (69.8 kg)  09/03/15 144 lb 10 oz (65.6 kg)   There is no height or weight on file to calculate BMI. BP Readings from Last 3 Encounters:  09/17/15 133/77  09/03/15 105/72  08/19/15 124/78   A/P: Physician pharmacist co-visit, plan formulated:   Change patient from Levemir to Lantus  Plan  to titrate once switched  Collaborate with [facility] pharmacist  Will continue to work with team in the care of this patient.

## 2015-09-23 NOTE — Progress Notes (Signed)
Internal Medicine Clinic Attending  Case discussed with Dr. Rice soon after the resident saw the patient.  We reviewed the resident's history and exam and pertinent patient test results.  I agree with the assessment, diagnosis, and plan of care documented in the resident's note. 

## 2015-09-28 ENCOUNTER — Other Ambulatory Visit: Payer: Self-pay | Admitting: *Deleted

## 2015-09-28 NOTE — Patient Outreach (Signed)
[facility]) Care Management [place] Telephone Outreach, Transition of Care day 16 09/28/2015  CAIDIN TAVERAS 30-Sep-1955 [id number]   Successful telephone outreach to Carter Kitten, 60 y/o male, referred to [place] for transition of care afterIP hospital visit August 10-11, 2017 for AMS and uncontrolled DM; patient was treated with IV fluids/ insulin during his IP hospital visit. Patient has history of HTN, DM, ETOH abuse with chronic pancreatitis and abdominal pain. From review of EMR, apparently patient has history of noncompliance with his medication adherence as well; [place] actively involved in patient's care. HIPAA verified.  Today, patient continues to report that he has been "doing good," since his hospital discharge, and that he has all of his medications and is taking as they are prescribed. Patient acknowledges that he has a home visit with Joseph Berkshire, [facility] pharmacist, about this medication needs tomorrow.    Patient continues to report that he has been monitoring and recording his blood sugars three times every day, and that they have "been running in the 200-210" range. I encouraged patient to continue this practice for review during future [facility] CM in-home visit, and he agreed to do so.   Mr. Zuba denied further concerns, needs, problems or issues today, and I confirmed that patient had my direct phone number, the 24-hour nurse phone line, and the main [facility] CM phone numbers.  I let Mr. Girgis know that another [facility] RN CM would be contacting him next week, and we confirmed our previously scheduled in-home visit for later this month.   Plan:  Mr. Brum will take his medications as they are prescribed and will attend all scheduled provider appointments.  Mr. Garavaglia will continue monitoring and recording his blood sugar reading three times every day.  Mr. Digirolamo will work with [place] to have his needs around medication  compliance/ adherence/ assistance addressed.  Mr. Fogelson will promptly contact his providers for any new concerns, issues, or problems.  [place] outreach for transition of care after recent hospital visit to continue with telephone outreach next week and in-home visit later this month.  [name], RN, BSN, [employer] [facility] Care Management  [phone]

## 2015-09-29 ENCOUNTER — Telehealth: Payer: Self-pay | Admitting: Licensed Clinical Social Worker

## 2015-09-29 ENCOUNTER — Other Ambulatory Visit: Payer: Self-pay | Admitting: Pharmacist

## 2015-09-29 NOTE — Patient Outreach (Signed)
[facility]) Care Management  [facility] CM Pharmacy   09/29/2015  Michael [name] [birth year] [id number]  Subjective: 60 year old male previously seen by me for medication adherence with assistance filling his pill box. He was recently hospitalized for hyperosmolar hyperglycemic state likely due to non-adherence with his insulin. Today he states he has a podiatrist appointment on Friday 10/01/15, followup appointment with the pain clinic in [place] on 10/07/15, and followup with Dr Dimple Casey on 10/15/15. Was started on loperamide due to diarrhea which he attributes to his gabapentin.     Over the past 2 weeks has been adherent to his pill box on 13/14 days, but he has not been adherent with his insulin over the past 6 days. He contributes his nonadherence to his insulin due to the labor day holiday and falling asleep.   CBG today is 235 (fasting, did not take insulin yesterday)  Denies hypoglycemia.  Reports proper treatment of hypoglycemia  Reports nocturia once per night.    Reports neuropathy has improved.  Reports self foot exams. Denies changes or problems  Self Monitored Blood Glucose  Before Breakfast Before Lunch Before Dinner  09/18/15 334 249 230  09/19/15 339 376 270  09/20/15 112 502 295  09/21/15 147 187 240  09/22/15 220 209 287  09/23/15 271 250 200  09/24/15 Did not check - -  09/25/15 Did not check - -  09/26/15 Did not check - -  09/27/15 Did not check - -  09/28/15 Did not check - -  09/29/15 Did not check - -    Objective:   Encounter Medications: Outpatient Encounter Prescriptions as of 09/29/2015  Medication Sig Note  . amitriptyline (ELAVIL) 50 MG tablet TAKE 1 TABLET BY MOUTH EVERY NIGHT AT BEDTIME 09/29/2015: Taking 75 mg twice daily  . CREON 36000 units CPEP capsule Take 1 capsule by mouth 3  times daily before meals   . Insulin Detemir (LEVEMIR FLEXTOUCH) 100 UNIT/ML Pen Inject 20 units daily before bedtime (Patient taking differently: Inject 24 Units into the skin  daily at 10 pm. Inject 20 units daily before bedtime)   . Insulin Pen Needle 31G X 5 MM MISC Use to inject 15 units of Levemir one time a day   . loperamide (IMODIUM) 2 MG capsule Take 1 capsule (2 mg total) by mouth as needed for diarrhea or loose stools. No more than 8 tablets in 1 day. 09/28/2015: Has not needed  . lovastatin (MEVACOR) 40 MG tablet Take 1 tablet (40 mg total) by mouth at bedtime.   . meloxicam (MOBIC) 15 MG tablet Take 1 tablet (15 mg total) by mouth daily.   . pantoprazole (PROTONIX) 40 MG tablet Take 1 tablet by mouth  daily   . ACCU-CHEK FASTCLIX LANCETS MISC Use to check your blood sugar 3 times a day (Patient not taking: Reported on 09/14/2015)   . Blood Glucose Monitoring Suppl (ACCU-CHEK AVIVA PLUS) w/Device KIT Check blood sugar up to 3 times a day (Patient not taking: Reported on 09/14/2015)   . folic acid (FOLVITE) 1 MG tablet Take 1 tablet (1 mg total) by mouth daily. (Patient not taking: Reported on 09/29/2015)   . glucose blood (ACCU-CHEK AVIVA PLUS) test strip The patient is insulin requiring, ICD 10 code E11.42. The patient tests 4 times per day. (Patient not taking: Reported on 09/29/2015)   . Lancet Devices (ACCU-CHEK SOFTCLIX) lancets Use to check your blood sugar 3 times a day. Dx code E11.65. Insulin dependent. (Patient  not taking: Reported on 09/29/2015)   . magnesium oxide (MAG-OX) 400 (241.3 Mg) MG tablet Take 1 tablet (400 mg total) by mouth daily. (Patient not taking: Reported on 09/14/2015) 09/28/2015: Patient states he ran out of this medication  . Vitamin D, Ergocalciferol, (DRISDOL) 50000 units CAPS capsule Take 1 capsule (50,000 Units total) by mouth every 7 (seven) days. (Patient not taking: Reported on 09/29/2015)    No facility-administered encounter medications on file as of 09/29/2015.     Functional Status: In your present state of health, do you have any difficulty performing the following activities: 09/17/2015 09/03/2015  Hearing? N N  Vision? N N   Difficulty concentrating or making decisions? N N  Walking or climbing stairs? N N  Dressing or bathing? N N  Doing errands, shopping? Y Y  Preparing Food and eating ? - -  Using the Toilet? - -  In the past six months, have you accidently leaked urine? - -  Do you have problems with loss of bowel control? - -  Managing your Medications? - -  Managing your Finances? - -  Housekeeping or managing your Housekeeping? - -  Some recent data might be hidden    Fall/Depression Screening: PHQ 2/9 Scores 09/17/2015 08/02/2015 07/05/2015 03/12/2015 01/28/2015 12/24/2014 11/25/2014  PHQ - 2 Score 0 0 0 0 0 0 0  PHQ- 9 Score - - - - - - -    Assessment: Drugs sorted by system:  Neurologic/Psychologic: amitriptyline  Cardiovascular: lovastatin  Gastrointestinal: Creon, pantoprazole  Endocrine: Levemir 24 units daily  Pain:gabapentin  Vitamins/Minerals: folic acid, magnesium oxide, vitamin D  Medications to avoid in the elderly:amitriptyline Other issues noted:Patient currently on amitriptyline 75 mg BID (prescribed 150 mg QHS from [facility] but takes 75 mg BID due to excess drowsiness). Patient is currently out of folic acid, magnesium oxide, and amitriptyline.  Medication Adherence: Patient has taken 13 of 14 days of his pill box medications. He was previously 100% adherent with his Levemir from 8/16 to 8/31 but missed his insulin for the past 6 days.   Plan: -Counseled on importance of adherence to his oral medications and insulin regimen to prevent further complications and hospitalizations. Counseled on signs/symptoms/treatment of hypoglycemia -Filled 2 week pill box and only 3 days of remaining amitriptyline due to his limited supply -[place] for refill of amitriptyline and Vitamin D -Will followup with mail order pharmacy about Accu-Chek strips, Folic acid, Magnesium oxide -Called [facility] to verify when his medications were to be shipped. His  Accu-Chek Aviva Plus is to be shipped on 09/30/15 per [facility].  Per [facility] he has AccuChek Aviva test strips, pen needles, meloxicam, loperamide, gabapentin, levemir, folic acid, protonix, lovastatin, amitriptyline, and Creon on file and most of those were filled and shipped on 09/16/15 except his test strips.  -Followup in patients home on Friday to assist with filling his pill box with medications that were missing and plan to bring him sample Contour Next One blood glucose test strips -Will contact patients primary care for magnesium oxide prescription to be sent to [facility] -Discussed poly pharmacy with patient and will use [facility] pharmacy this time for Vitamin D and amitriptyline but then will use [facility] for all chronic medications.   [name], PharmD [facility] PGY2 Pharmacy Resident [phone]  [facility] CM Care Plan Problem One   Flowsheet Row Most Recent Value  Care Plan Problem One  Non Adherence to Medications as evidenced by pill box at home visits  Role Documenting the Problem One  Clinical Pharmacist  Care Plan for Problem One  Active  [facility] CM Short Term Goal #1 (0-30 days)  Patient will utilize his pill box 90% of the time as evidenced by pharmacist review of remaining medications in pill box over the next week  [facility] CM Short Term Goal #1 Start Date  09/29/15  Interventions for Short Term Goal #1  Pharmacist will fill pill boxes and will teach patient how to fill his own pill box.  Education provided on need for proper adherence and proper storage of medications.  Counseled on importance of adherence to his regimen and taking his medications as prescribed to prevent furture ED visits and hospitalizations.  plan to teach patient how to fill his pill box at the next visit once patient shows that he can be adherent to his regimen. Discussed adherence with patients sister and she plans to help him with his medications.  Patient will put an "x" beside the date where he is writing down his blood  glucose on the days that he takes his Levemir.

## 2015-09-29 NOTE — Telephone Encounter (Signed)
[name] placed call to CSW requesting assistance finding housing.  Currently, pt lives with sister and niece.  Pt states he is looking to find is own independent housing, rent up to $400.  CSW has listing available.  [name] states he will contact pt at a later time.

## 2015-09-30 NOTE — Telephone Encounter (Signed)
Mr. Burnet did not contact worker again.  Information mailed.

## 2015-10-01 ENCOUNTER — Encounter: Payer: Self-pay | Admitting: Pharmacist

## 2015-10-01 ENCOUNTER — Other Ambulatory Visit: Payer: Self-pay | Admitting: *Deleted

## 2015-10-01 ENCOUNTER — Telehealth: Payer: Self-pay | Admitting: Internal Medicine

## 2015-10-01 ENCOUNTER — Other Ambulatory Visit: Payer: Self-pay | Admitting: Pharmacist

## 2015-10-01 ENCOUNTER — Encounter: Payer: Self-pay | Admitting: *Deleted

## 2015-10-01 ENCOUNTER — Ambulatory Visit: Payer: Medicare Other | Admitting: Podiatry

## 2015-10-01 ENCOUNTER — Other Ambulatory Visit: Payer: Self-pay

## 2015-10-01 DIAGNOSIS — E0810 Diabetes mellitus due to underlying condition with ketoacidosis without coma: Secondary | ICD-10-CM

## 2015-10-01 DIAGNOSIS — E1142 Type 2 diabetes mellitus with diabetic polyneuropathy: Secondary | ICD-10-CM

## 2015-10-01 DIAGNOSIS — Z794 Long term (current) use of insulin: Principal | ICD-10-CM

## 2015-10-01 MED ORDER — GLUCOSE BLOOD VI STRP: ORAL_STRIP | 3 refills | Status: DC

## 2015-10-01 NOTE — Patient Outreach (Addendum)
[facility]) Care Management  [place]   10/01/2015  BROK STOCKING 04-Sep-1955 [id number]  Subjective: Did not take oral meds on 9/6 but did take all medications on 9/7 and this morning.  He has been 100% compliant with his insulin on 9/6, and 9/7 and states he has been taking 24 units of Levemir.  Patient states he canceled prescriptions from [place]. He states that he should receive his test strips either today or early next week per [facility] mail order.   Denies s/sx of hypoglycemia  Objective:  Self Monitored Blood Glucose  9/6 235, 220, 200  (before breakfast, lunch, dinner) 9/7 95, 337, 470  (before breakfast, lunch, dinner) 9/8 281 (before breakfast)    Encounter Medications: Outpatient Encounter Prescriptions as of 10/01/2015  Medication Sig Note  . amitriptyline (ELAVIL) 50 MG tablet TAKE 1 TABLET BY MOUTH EVERY NIGHT AT BEDTIME 09/29/2015: Taking 75 mg twice daily  . CREON 36000 units CPEP capsule Take 1 capsule by mouth 3  times daily before meals   . folic acid (FOLVITE) 1 MG tablet Take 1 tablet (1 mg total) by mouth daily.   . Insulin Detemir (LEVEMIR FLEXTOUCH) 100 UNIT/ML Pen Inject 20 units daily before bedtime (Patient taking differently: Inject 24 Units into the skin daily at 10 pm. Inject 20 units daily before bedtime)   . Insulin Pen Needle 31G X 5 MM MISC Use to inject 15 units of Levemir one time a day   . Lancet Devices (ACCU-CHEK SOFTCLIX) lancets Use to check your blood sugar 3 times a day. Dx code E11.65. Insulin dependent.   Marland Kitchen loperamide (IMODIUM) 2 MG capsule Take 1 capsule (2 mg total) by mouth as needed for diarrhea or loose stools. No more than 8 tablets in 1 day. 09/28/2015: Has not needed  . lovastatin (MEVACOR) 40 MG tablet Take 1 tablet (40 mg total) by mouth at bedtime.   . meloxicam (MOBIC) 15 MG tablet Take 1 tablet (15 mg total) by mouth daily.   . pantoprazole (PROTONIX) 40 MG tablet Take 1 tablet by mouth  daily    . Vitamin D, Ergocalciferol, (DRISDOL) 50000 units CAPS capsule Take 1 capsule (50,000 Units total) by mouth every 7 (seven) days.   Marland Kitchen ACCU-CHEK FASTCLIX LANCETS MISC Use to check your blood sugar 3 times a day (Patient not taking: Reported on 09/14/2015)   . Blood Glucose Monitoring Suppl (ACCU-CHEK AVIVA PLUS) w/Device KIT Check blood sugar up to 3 times a day (Patient not taking: Reported on 09/14/2015)   . glucose blood (ACCU-CHEK AVIVA PLUS) test strip The patient is insulin requiring, ICD 10 code E11.42. The patient tests 4 times per day. (Patient not taking: Reported on 10/01/2015)   . magnesium oxide (MAG-OX) 400 (241.3 Mg) MG tablet Take 1 tablet (400 mg total) by mouth daily. (Patient not taking: Reported on 09/14/2015) 09/28/2015: Patient states he ran out of this medication   No facility-administered encounter medications on file as of 10/01/2015.     Functional Status: In your present state of health, do you have any difficulty performing the following activities: 09/17/2015 09/03/2015  Hearing? N N  Vision? N N  Difficulty concentrating or making decisions? N N  Walking or climbing stairs? N N  Dressing or bathing? N N  Doing errands, shopping? Y Y  Conservation officer, nature and eating ? - -  Using the Toilet? - -  In the past six months, have you accidently leaked urine? - -  Do you have problems with loss of bowel control? - -  Managing your Medications? - -  Managing your Finances? - -  Housekeeping or managing your Housekeeping? - -  Some recent data might be hidden    Fall/Depression Screening: PHQ 2/9 Scores 09/17/2015 08/02/2015 07/05/2015 03/12/2015 01/28/2015 12/24/2014 11/25/2014  PHQ - 2 Score 0 0 0 0 0 0 0  PHQ- 9 Score - - - - - - -    Assessment: Drugs sorted by system:  Neurologic/Psychologic: amitriptyline  Cardiovascular: lovastatin  Gastrointestinal: Creon, pantoprazole  Endocrine: Levemir 24 units daily  Pain:gabapentin  Vitamins/Minerals: folic acid,  magnesium oxide, vitamin D  Medications to avoid in the elderly:amitriptyline Other issues noted:Patient currently on amitriptyline 75 mg twice daily(prescribed 150 mg at bedtime from [facility] Melissa Cole,NP but takes 75 mg twice due to excess drowsiness, also has duplicate prescription from internal medicine for 50 mg at bedtime). Patient is currently out magnesium oxide.  Medication Adherence: Patient has been 100% compliant with his insulin and 50% compliant with oral medications since my last visit on 9/6.   Plan: -Filled pill box with Amitriptyline 75 mg twice daily (has duplicate prescription for amitriptyline 50 mg at bedtime), Vitamin D, and folic acid which he was missing on 09/29/15 when I originally filled his 2 week pill box.  Patient is currently out of Magnesium oxide, will contact primary care provider to send new prescription to [facility] -Provided patient with one box of test strips until his test strips come via mail order next week.  -Followup in 2 weeks to refill pill box and access compliance to oral medications and insulin.   [name], PharmD [facility] PGY2 Pharmacy Resident [phone]  [facility] CM Care Plan Problem One   Flowsheet Row Most Recent Value  Care Plan Problem One  Non Adherence to Medications as evidenced by pill box at home visits  Role Documenting the Problem One  [place] for Problem One  Active  [facility] CM Short Term Goal #1 (0-30 days)  Patient will utilize his pill box 90% of the time as evidenced by pharmacist review of remaining medications in pill box over the next week  [facility] CM Short Term Goal #1 Start Date  09/29/15  Interventions for Short Term Goal #1  Pharmacist will fill pill boxes and will teach patient how to fill his own pill box.  Education provided on need for proper adherence and proper storage of medications.  Counseled on importance of adherence to his regimen and taking his medications as prescribed to prevent  [place] ED visits and hospitalizations.  plan to teach patient how to fill his pill box at the next visit once patient shows that he can be adherent to his regimen. Discussed adherence with patients sister and she plans to help him with his medications.  Patient will put an "x" beside the date where he is writing down his blood glucose on the days that he takes his Levemir.

## 2015-10-01 NOTE — Telephone Encounter (Signed)
Requesting test strip to be filled @ [facility].

## 2015-10-01 NOTE — Telephone Encounter (Signed)
Called pt - informed rx for test strips was electronic sent today to [place].

## 2015-10-01 NOTE — Telephone Encounter (Signed)
   Reason for call:   I received a call from [name] at 4:00 AM indicating that he needs a refill on his test strips.   Pertinent Data:   Reports he only has 5 strips left and was concerned that he was almost out   Assessment / Plan / Recommendations:   Refilled test strips, sent to [place], [place]   10/01/2015, 4:20 AM

## 2015-10-01 NOTE — Patient Outreach (Signed)
[place] [facility]) Care Management [place] Telephone Outreach, Transition of Care day 19 Care Coordination Telephone Outreach 10/01/2015  NOHLAN BAUGUESS 01-Dec-1955 [id number]  Successful incoming call from Shahzad Wragg, 60 y/o male, referred to [place] for transition of care afterIP hospital visit August 10-11, 2017 for AMS and uncontrolled DM; patient was treated with IV fluids/ insulin during his IP hospital visit. Patient has history of HTN, DM, ETOH abuse with chronic pancreatitis and abdominal pain. From review of EMR, apparently patient has history of noncompliance with his medication adherence as well; [place] actively involved in patient's care. HIPAA/ identity verified.  Today, patient reports that he again does not have transportation to his "foot care" appointment this morning, although he had previously reported to me that he had arranged transportation to this appointment through Medicaid.  Patient is very difficult to understand over phone, as he speaks very fast.  Apparently, Mr. Jon reports that if he is to attend this morning appointment, he would need transportation "right now."  Reviewed notes in EMR from [place] on September 15, 2015 where she explained to patient that the cab ride she arranged for patient that day was a one-time exception, and I re-iterated advice [name] provided to patient regarding transportation arrangements he needs.  Patient verbalized understanding, and stated that he "could not get anyone on the phone."  Patient acknowledges that he has a home visit with Joseph Berkshire, [facility] pharmacist,this afternoon.   I reached out by phone to both Eula Fried, [facility] CSW, and Joseph Berkshire, [facility] pharmacist, as an FYI regarding patient's call to me this morning, and left a voice mail with [place].  [name] reported that earlier this week patient had also confirmed with him that he had transportation arranged for this morning's  appointment.  Mr. Maczko denied further concerns, needs, problems or issues today, and I confirmed that patient hadmy direct phone number, the 24-hour nurse phone line, and the main [facility] CM phone numbers. I again let Mr. Dralle know that another [facility] RN CM would be contacting him next week, and we confirmed our previously scheduled in-home visit for later this month.   Plan:  Mr. Winker will take his medications as they are prescribed and will attend all scheduled provider appointments.  Mr. Jeschke will use his established resources for the transportation he needs for his provider appointments.  Mr. Septer will continue monitoring and recording his blood sugar reading three times every day.  Mr. Sampley will work with [place] to have his needs around medication compliance/ adherence/ assistance addressed.  Mr. [place] will promptly contact his providers for any new concerns, issues, or problems.  [place] outreach for transition of care after recent hospital visit to continue with telephone outreach next week and in-home visit later this month.  [name], RN, BSN, [facility] Care Management  [phone]

## 2015-10-06 ENCOUNTER — Other Ambulatory Visit: Payer: Self-pay

## 2015-10-06 NOTE — Patient Outreach (Signed)
[place] [facility]) Care Management  10/06/2015  Michael [name] 1955/05/09 [id number]   60 year old with recent admission due to hyperglycemia. RNCM called for Transition of care. Member reports he is doing well. Member states he has been checking his blood sugar and recording them. Blood sugar this morning was 211.  Member reports his main problem now is transprtation to his appointments. Member states she has called the social services department for transportation and does not get anyone on the phone. Member reports he had to reschedule his appointment with the podiatrist for October 2nd and he has an appointment with the pain clinic in [facility] tomorrow and states he will have to call to reschedule that appointment because he has been unable to get transportation arranaged. RNCM discussed 12ngo, but member reports he does not have the money.   Re: medications-member reports "I take that everyday". Member states that [place] resident involvement has helped.  Plan: update assigned RNCM.  Covering RNCM: Thea Silversmith, RN, MSN, [place] Coordinator Cell: [phone]

## 2015-10-06 NOTE — Telephone Encounter (Signed)
This encounter was created in error - please disregard.

## 2015-10-07 ENCOUNTER — Other Ambulatory Visit: Payer: Self-pay | Admitting: Pharmacist

## 2015-10-07 DIAGNOSIS — E559 Vitamin D deficiency, unspecified: Secondary | ICD-10-CM

## 2015-10-07 DIAGNOSIS — E612 Magnesium deficiency: Secondary | ICD-10-CM

## 2015-10-08 ENCOUNTER — Other Ambulatory Visit: Payer: Self-pay | Admitting: *Deleted

## 2015-10-08 NOTE — Patient Outreach (Signed)
[place] [facility]) Care Management  10/08/2015  Michael [name] November 27, 1955 [id number]   CSW attempted to reach patient per request of [name] regarding transportation needs. CSW left message for patient and awaiting callback.   Eduard Clos, MSW, [place] Worker  [place] [phone]

## 2015-10-08 NOTE — Patient Outreach (Signed)
[place] [facility]) Care Management  10/08/2015  Michael [name] [birth year] [id number]   Patient returned CSW call- CSW introduced self as covering CSW and confirmed identity with patient (DOB, address and full name).  CSW discussed transportation needs with him- he reports he has rescheduled his pain clinic appointment for 9/27 because he was unable to get through to [facility] transportatoin this week.  CSW provided the number to him and he plans to call and arrange for his transport.  CSW offered visit but he reports he is ok and "the Nurse is coming on the 20th".  CSW will share with Eula Fried, patient's CSW, upon her return to work next week for further follow up and support.  Provided patient with my contact #  [place] if needs arising.   Eduard Clos, MSW, [place] Worker  [place] [phone]

## 2015-10-11 MED ORDER — MAGNESIUM OXIDE 400 (241.3 MG) MG PO TABS: 400.0000 mg | ORAL_TABLET | Freq: Every day | ORAL | 3 refills | Status: DC

## 2015-10-11 MED ORDER — VITAMIN D (ERGOCALCIFEROL) 1.25 MG (50000 UNIT) PO CAPS: 50000.0000 [IU] | ORAL_CAPSULE | ORAL | 6 refills | Status: DC

## 2015-10-12 ENCOUNTER — Other Ambulatory Visit: Payer: Self-pay | Admitting: *Deleted

## 2015-10-12 NOTE — Patient Outreach (Signed)
[place] [facility]) Care Management [place] Telephone Outreach, Transition of Care, day 30 10/12/2015  [name] Sep 06, 1955 [id number]  Unsuccessful telephone outreach to Michael [name], 60 y/o male, referred to [place] for transition of care afterIP hospital visit August 10-11, 2017 for AMS and uncontrolled DM; patient was treated with IV fluids/ insulin during his IP hospital visit. Patient has history of HTN, DM, ETOH abuse with chronic pancreatitis and abdominal pain. From review of EMR, apparently patient has history of noncompliance with his medication adherence as well; [place] actively involved in patient's care.  HIPAA compliant VM message left for patient, confirming our previously scheduled appointment for tomorrow morning, also making Michael [name] aware that I would be arriving at 10:00 am, rather than at 10:45; my contact details were left on the voice mail message.  Note update 1525:  Patient successfully returned my call; HIPAA verified; modified appointment time for tomorrow confirmed with patient.  Plan:  [place] outreach for transition of care with scheduled in-home visit tomorrow.   [name], RN, BSN, [employer] [facility] Care Management  [phone]

## 2015-10-13 ENCOUNTER — Other Ambulatory Visit: Payer: Self-pay | Admitting: Licensed Clinical Social Worker

## 2015-10-13 ENCOUNTER — Other Ambulatory Visit: Payer: Self-pay | Admitting: Pharmacist

## 2015-10-13 ENCOUNTER — Encounter: Payer: Self-pay | Admitting: *Deleted

## 2015-10-13 ENCOUNTER — Other Ambulatory Visit: Payer: Self-pay | Admitting: *Deleted

## 2015-10-13 ENCOUNTER — Ambulatory Visit: Payer: Self-pay | Admitting: *Deleted

## 2015-10-13 NOTE — Patient Outreach (Signed)
[place] [facility]) Care Management  [place]   10/13/2015  Michael [name] 02/20/1955 [id number]  Subjective: 60 year old male previously seen by me for medication adherence with assistance filling his pill box. He was recently hospitalized for hyperosmolar hyperglycemic state likely due to non-adherence with his insulin on 09/03/15.  Patients metformin was stopped in 2016 due to pancreatic failure and history of chronic alcohol abuse   Today he states he may have missed a couple of days of his medications on the weekend and his pill box shows that he has missed 2-3 days of his oral medications. He has not put the "x" on his blood glucose log when he takes his insulin over the past 6 days but states that he has taken his insulin for half of those days. He also states that he has missed his foot doctor and his pain clinic appointments due to transportation issues.   Dr Benjamine Mola has sent his magnesium prescription to [facility] but patient has not yet received.  He also has not received his Accu-Chek test strips and is still using the strips that I have provided him.   CBG today 472 mg/dL  Self Monitored Blood Glucose  Before Breakfast Before Lunch Before Dinner Took Insulin (Patient reported with "x")  9/6 235 220 200 Yes  9/7 95 337 470 Yes  9/8 281 335 502 Yes  9/9 119 335 337 Yes  9/10 357 212 438 Yes  9/11 367 444 409 Yes  9/12 295 320 400 Yes  9/13 311 456 358 Yes  9/14 200 - - Yes  9/15 Did not check - - Unknown  9/16 Did not check - - Unknown  9/17 - 225 - Unknown  9/18 167 - 147 Unknown  9/19 307 405  Unknown  9/20 472   Unknown   Objective:  Estimated CrCl (Using Actual Body Weight): 120 mL/min  Encounter Medications: Outpatient Encounter Prescriptions as of 10/13/2015  Medication Sig Note  . amitriptyline (ELAVIL) 75 MG tablet Take 75 mg by mouth 2 (two) times daily.   Marland Kitchen CREON 36000 units CPEP capsule Take 1 capsule by mouth 3  times daily before meals   .  folic acid (FOLVITE) 1 MG tablet Take 1 tablet (1 mg total) by mouth daily.   . Insulin Detemir (LEVEMIR FLEXTOUCH) 100 UNIT/ML Pen Inject 20 units daily before bedtime (Patient taking differently: Inject 24 Units into the skin daily at 10 pm. Inject 20 units daily before bedtime)   . Insulin Pen Needle 31G X 5 MM MISC Use to inject 15 units of Levemir one time a day   . Lancet Devices (ACCU-CHEK SOFTCLIX) lancets Use to check your blood sugar 3 times a day. Dx code E11.65. Insulin dependent.   Marland Kitchen loperamide (IMODIUM) 2 MG capsule Take 1 capsule (2 mg total) by mouth as needed for diarrhea or loose stools. No more than 8 tablets in 1 day. 09/28/2015: Has not needed  . lovastatin (MEVACOR) 40 MG tablet Take 1 tablet (40 mg total) by mouth at bedtime.   . pantoprazole (PROTONIX) 40 MG tablet Take 1 tablet by mouth  daily   . Vitamin D, Ergocalciferol, (DRISDOL) 50000 units CAPS capsule Take 1 capsule (50,000 Units total) by mouth every 7 (seven) days.   Marland Kitchen ACCU-CHEK FASTCLIX LANCETS MISC Use to check your blood sugar 3 times a day (Patient not taking: Reported on 10/13/2015)   . Blood Glucose Monitoring Suppl (ACCU-CHEK AVIVA PLUS) w/Device KIT Check  blood sugar up to 3 times a day (Patient not taking: Reported on 10/13/2015)   . glucose blood (ACCU-CHEK AVIVA PLUS) test strip The patient is insulin requiring, ICD 10 code E11.42. The patient tests 4 times per day. (Patient not taking: Reported on 10/13/2015)   . magnesium oxide (MAG-OX) 400 (241.3 Mg) MG tablet Take 1 tablet (400 mg total) by mouth daily. (Patient not taking: Reported on 10/13/2015)   . [DISCONTINUED] meloxicam (MOBIC) 15 MG tablet Take 1 tablet (15 mg total) by mouth daily.    No facility-administered encounter medications on file as of 10/13/2015.     Functional Status: In your present state of health, do you have any difficulty performing the following activities: 09/17/2015 09/03/2015  Hearing? N N  Vision? N N  Difficulty concentrating  or making decisions? N N  Walking or climbing stairs? N N  Dressing or bathing? N N  Doing errands, shopping? Y Y  Preparing Food and eating ? - -  Using the Toilet? - -  In the past six months, have you accidently leaked urine? - -  Do you have problems with loss of bowel control? - -  Managing your Medications? - -  Managing your Finances? - -  Housekeeping or managing your Housekeeping? - -  Some recent data might be hidden    Fall/Depression Screening: PHQ 2/9 Scores 09/17/2015 08/02/2015 07/05/2015 03/12/2015 01/28/2015 12/24/2014 11/25/2014  PHQ - 2 Score 0 0 0 0 0 0 0  PHQ- 9 Score - - - - - - -    Assessment: Medication Adherence: Patient has taken 9 of 13 days of his pill box oral medications. Patient reports he has missed 3-4 days of his insulin over the past week but was 100% compliant the week before.   Plan: -Counseled on importance of adherence to his oral medication and his insulin to prevent further diabetes complications and hospitalizations.  Set goal with patient to be 100% compliant over the next 2 weeks -Filled 2 week pill box with all of his medications except for his magnesium which he is out of (has been [facility] to [facility] by Dr Benjamine Mola) -Will followup with [facility] to see when test strips will be sent -Will recommend starting SGLT-2 to Dr Benjamine Mola due to patient being metformin being stopped in 2016 due to pancreatitis/history of alcohol abuse and patient being non-adherent to his insulin regimen.  Invokana would be the preferred agent due to [facility] Medicaid preferred drug list.   [name], PharmD [facility] PGY2 Pharmacy Resident [phone]  [facility] CM Care Plan Problem One   Flowsheet Row Most Recent Value  Care Plan Problem One  Non Adherence to Medications as evidenced by pill box at home visits  Role Documenting the Problem One  [place] for Problem One  Active  [facility] CM Short Term Goal #1 (0-30 days)  Patient will utilize his pill box 90% of the time as  evidenced by pharmacist review of remaining medications in pill box over the next 2 weeks  [facility] CM Short Term Goal #1 Start Date  10/13/15  Interventions for Short Term Goal #1  Pharmacist will fill pill boxes and will teach patient how to fill his own pill box.  Education provided on need for proper adherence and proper storage of medications.  Counseled on importance of adherence to his regimen and taking his medications as prescribed to prevent furture ED visits and hospitalizations.  plan to teach patient how to fill his  pill box at the next visit once patient shows that he can be adherent to his regimen. Discussed adherence with patients sister and she plans to help him with his medications.  Patient will put an "x" beside the date where he is writing down his blood glucose on the days that he takes his Levemir.

## 2015-10-13 NOTE — Patient Outreach (Signed)
[place] [facility]) Care Management  [place] CM Routine Home Visit, Transition of Care day 31 10/13/2015  Michael [name] [birth year] [id number]  Michael [name] is an 60 y.o. male referred to [facility] for transition of care afterIP hospital visit August 10-11, 2017 for AMS and uncontrolled DM; patient was treated with IV fluids/ insulin during his IP hospital visit. Patient has history of HTN, DM, ETOH abuse with chronic pancreatitis and abdominal pain. From review of EMR, apparently patient has history of noncompliance with his medication adherence as well; [place] actively involved in patient's care.   Today's in-home visit was a joint visit with [facility] pharmacist, Joseph Berkshire, who has been actively working with patient on medication adherence/ compliance.  Patient reports that he has not been taking insulin regularly; reports taking 3 days out of last 5 days.  [place] he is monitoring and recording blood sugars "most of the time," Patient's recorded blood sugar trends reviewed; patient did not monitor or record blood sugars 3 days last week.  Last week's sugar ranges: low 95 (fasting am) high 502 (evening).  Patient noted to provide inconsistent reporting of his blood sugar monitoring and insulin/medication adherence during today's visit.  See details of [facility] pharmacy notes from today's visit.   Education on self-health management of chronic disease state of DM reinforced during today's in-home visit, and patient was provided educational material to review prior to next [facility] CM in-home visit.  Patient verbalizes that he continues to have difficulties in obtaining transportation to and from provider appointments; this issue was previously addressed by [facility] CSW, Eula Fried.  From what patient reports, transportation issues seem to be related to patient not calling agencies in time for transportation to be arranged.  Reinforced resources with patient, along with need to call  early to secure transportation to provider appointments; patient agreed to do so.  Patient denies further needs, questions, issues or problems today.  Subjective: "I am feeling a lot better since my hospital visit."  Objective:    BP 136/74   Pulse (!) 102   Resp 16   Wt 147 lb (66.7 kg)   SpO2 98%   BMI 25.23 kg/m    Review of Systems  Constitutional: Negative.  Negative for fever and weight loss.  Respiratory: Negative.  Negative for cough, sputum production, shortness of breath and wheezing.   Cardiovascular: Negative.  Negative for chest pain and leg swelling.  Gastrointestinal: Positive for diarrhea. Negative for abdominal pain and nausea.       Pt. Reports occasional diarrhea  Genitourinary: Negative.   Musculoskeletal: Negative for falls, joint pain and myalgias.  Neurological: Negative.  Negative for weakness.  Psychiatric/Behavioral: Negative for depression. The patient is not nervous/anxious.     Physical Exam  Constitutional: He is oriented to person, place, and time. He appears well-developed and well-nourished. No distress.  Cardiovascular: Regular rhythm, normal heart sounds and intact distal pulses.  Tachycardia present.   Pulses:      Radial pulses are 2+ on the right side, and 2+ on the left side.       Dorsalis pedis pulses are 2+ on the right side, and 2+ on the left side.  Respiratory: Effort normal and breath sounds normal. No respiratory distress. He has no wheezes. He has no rales.  GI: Soft. Bowel sounds are normal.  Musculoskeletal: He exhibits no edema.  Neurological: He is alert and oriented to person, place, and time.  Skin: Skin is warm  and dry.  Psychiatric: He has a normal mood and affect. His behavior is normal. Judgment and thought content normal.    Encounter Medications:   Outpatient Encounter Prescriptions as of 10/13/2015  Medication Sig Note  . ACCU-CHEK FASTCLIX LANCETS MISC Use to check your blood sugar 3 times a day (Patient not  taking: Reported on 09/14/2015)   . amitriptyline (ELAVIL) 75 MG tablet Take 75 mg by mouth 2 (two) times daily.   . Blood Glucose Monitoring Suppl (ACCU-CHEK AVIVA PLUS) w/Device KIT Check blood sugar up to 3 times a day (Patient not taking: Reported on 09/14/2015)   . CREON 36000 units CPEP capsule Take 1 capsule by mouth 3  times daily before meals   . folic acid (FOLVITE) 1 MG tablet Take 1 tablet (1 mg total) by mouth daily.   Marland Kitchen glucose blood (ACCU-CHEK AVIVA PLUS) test strip The patient is insulin requiring, ICD 10 code E11.42. The patient tests 4 times per day. (Patient not taking: Reported on 10/01/2015)   . Insulin Detemir (LEVEMIR FLEXTOUCH) 100 UNIT/ML Pen Inject 20 units daily before bedtime (Patient taking differently: Inject 24 Units into the skin daily at 10 pm. Inject 20 units daily before bedtime)   . Insulin Pen Needle 31G X 5 MM MISC Use to inject 15 units of Levemir one time a day   . Lancet Devices (ACCU-CHEK SOFTCLIX) lancets Use to check your blood sugar 3 times a day. Dx code E11.65. Insulin dependent.   Marland Kitchen loperamide (IMODIUM) 2 MG capsule Take 1 capsule (2 mg total) by mouth as needed for diarrhea or loose stools. No more than 8 tablets in 1 day. 09/28/2015: Has not needed  . lovastatin (MEVACOR) 40 MG tablet Take 1 tablet (40 mg total) by mouth at bedtime.   . magnesium oxide (MAG-OX) 400 (241.3 Mg) MG tablet Take 1 tablet (400 mg total) by mouth daily.   . meloxicam (MOBIC) 15 MG tablet Take 1 tablet (15 mg total) by mouth daily.   . pantoprazole (PROTONIX) 40 MG tablet Take 1 tablet by mouth  daily   . Vitamin D, Ergocalciferol, (DRISDOL) 50000 units CAPS capsule Take 1 capsule (50,000 Units total) by mouth every 7 (seven) days.    No facility-administered encounter medications on file as of 10/13/2015.     Functional Status:   In your present state of health, do you have any difficulty performing the following activities: 09/17/2015 09/03/2015  Hearing? N N  Vision? N N   Difficulty concentrating or making decisions? N N  Walking or climbing stairs? N N  Dressing or bathing? N N  Doing errands, shopping? Y Y  Preparing Food and eating ? - -  Using the Toilet? - -  In the past six months, have you accidently leaked urine? - -  Do you have problems with loss of bowel control? - -  Managing your Medications? - -  Managing your Finances? - -  Housekeeping or managing your Housekeeping? - -  Some recent data might be hidden    Fall/Depression Screening:    PHQ 2/9 Scores 09/17/2015 08/02/2015 07/05/2015 03/12/2015 01/28/2015 12/24/2014 11/25/2014  PHQ - 2 Score 0 0 0 0 0 0 0  PHQ- 9 Score - - - - - - -    Assessment:  Mr. Langston has successfully met his previously established goal to not re-admit to the hospital within one month of his discharge home from last admission, however, patient remains inconsistent with his medication adherence/ compliance/ as  well as with his monitoring and recording of his blood sugars.  Mr. Yonker needs ongoing reinforcement of self-health management of chronic disease state of DM.  Mr. Galvan continues to report difficulties with his previously established means of transportation through social services, which appear to be related to patient not contacting agencies soon enough to obtain transportation to appointments.  At times, Mr. Kruzel provides conflicting reports of his health practices for self management of DM.    Plan:   Mr. Dickison will take his medications as they are prescribed and will attend all scheduled provider appointments.  Mr. Sans will use his established resources for the transportation he needs for his provider appointments.  Mr. Russett will continue monitoring and recording his blood sugar reading three times every day.  Mr. Baswell will work with [place] to have his needs around medication compliance/ adherence/ assistance addressed.  Mr. Cerveny will promptly contact his providers for any new concerns,  issues, or problems.  [place] outreach to continue with in-home visit nextmonth.  [name], RN, BSN, [employer] [facility] Care Management  [phone]

## 2015-10-13 NOTE — Patient Outreach (Addendum)
[place] [facility]) Care Management  10/13/2015  Michael [name] August 12, 1955 [id number]   Assessment- CSW received update from coverage [facility] CSW Eduard Clos. She reports that she received a message from a coverage RNCM stating that patient needed transportation resources. CSW has informed [facility] staff members and has clearly documented that patient has two sources of stable transportation through [employer] and Medicaid and we are unable to assist him with arranging transportation again due these resources already being in place. CSW has also educated patient on these resources and reminded him on how to schedule transportation with [employer].   Plan-CSW will not open case.  Michael [name], BSW, MSW, [email or web address] Phone: [phone] Fax: [phone]

## 2015-10-14 ENCOUNTER — Telehealth: Payer: Self-pay | Admitting: Internal Medicine

## 2015-10-14 NOTE — Telephone Encounter (Signed)
APT. REMINDER CALL, LMTCB °

## 2015-10-15 ENCOUNTER — Ambulatory Visit (INDEPENDENT_AMBULATORY_CARE_PROVIDER_SITE_OTHER): Payer: Medicare Other | Admitting: Internal Medicine

## 2015-10-15 ENCOUNTER — Other Ambulatory Visit: Payer: Self-pay | Admitting: Internal Medicine

## 2015-10-15 VITALS — BP 119/72 | HR 104 | Temp 98.3°F | Ht 64.0 in | Wt 150.3 lb

## 2015-10-15 DIAGNOSIS — E1165 Type 2 diabetes mellitus with hyperglycemia: Secondary | ICD-10-CM | POA: Diagnosis not present

## 2015-10-15 DIAGNOSIS — Z794 Long term (current) use of insulin: Secondary | ICD-10-CM | POA: Diagnosis not present

## 2015-10-15 DIAGNOSIS — K861 Other chronic pancreatitis: Secondary | ICD-10-CM

## 2015-10-15 DIAGNOSIS — K529 Noninfective gastroenteritis and colitis, unspecified: Secondary | ICD-10-CM

## 2015-10-15 DIAGNOSIS — E1142 Type 2 diabetes mellitus with diabetic polyneuropathy: Secondary | ICD-10-CM | POA: Diagnosis not present

## 2015-10-15 DIAGNOSIS — G8929 Other chronic pain: Secondary | ICD-10-CM

## 2015-10-15 DIAGNOSIS — M25561 Pain in right knee: Secondary | ICD-10-CM | POA: Diagnosis not present

## 2015-10-15 DIAGNOSIS — E0810 Diabetes mellitus due to underlying condition with ketoacidosis without coma: Secondary | ICD-10-CM

## 2015-10-15 DIAGNOSIS — M25562 Pain in left knee: Secondary | ICD-10-CM

## 2015-10-15 LAB — POCT GLYCOSYLATED HEMOGLOBIN (HGB A1C): HEMOGLOBIN A1C: 10.6

## 2015-10-15 LAB — GLUCOSE, CAPILLARY: Glucose-Capillary: 424 mg/dL — ABNORMAL HIGH (ref 65–99)

## 2015-10-15 MED ORDER — IBUPROFEN 800 MG PO TABS: 800.0000 mg | ORAL_TABLET | Freq: Three times a day (TID) | ORAL | 0 refills | Status: DC | PRN

## 2015-10-15 MED ORDER — INSULIN DETEMIR 100 UNIT/ML FLEXPEN: PEN_INJECTOR | SUBCUTANEOUS | 3 refills | Status: DC

## 2015-10-15 MED ORDER — GLUCOSE BLOOD VI STRP: ORAL_STRIP | 3 refills | Status: DC

## 2015-10-15 NOTE — Progress Notes (Signed)
   CC: Diabetes management  HPI:  [name] is a 60 y.o. man with chronic pancreatitis and diabetes complicated by previously hypoglycemia causing seizures and DKA as recently as last month. This was related to inconsistent use of his insulin. He has some improvement in compliance but still misses doses as often as several days per week. He does endorse ongoing increased thirst and urinary frequency, as well as frequent loose stools.  See problem based assessment and plan below for additional details.  Past Medical History:  Diagnosis Date  . Diabetes mellitus without complication ([place])   . Pancreatitis   . Seizures ([place])     Review of Systems:   Review of Systems  Constitutional: Negative for fever.  Eyes: Negative for blurred vision.  Respiratory: Negative for shortness of breath.   Cardiovascular: Negative for chest pain.  Gastrointestinal: Positive for diarrhea.  Genitourinary: Positive for frequency.  Musculoskeletal: Positive for joint pain. Negative for back pain and myalgias.  Neurological: Negative for sensory change and headaches.  Endo/Heme/Allergies: Positive for polydipsia.    Physical Exam:  Vitals:   10/15/15 1422  BP: 119/72  Pulse: (!) 104  Temp: 98.3 F (36.8 C)  TempSrc: Oral  SpO2: 100%  Weight: 150 lb 4.8 oz (68.2 kg)  Height: 5\' 4"  (1.626 m)   GENERAL- alert, co-operative, NAD HEENT- Atraumatic, PERRL, oral mucosa appears moist CARDIAC- RRR, no murmurs, rubs or gallops. RESP- CTAB, no wheezes or crackles. ABDOMEN- Mild diffuse tenderness, no guarding or rebound NEURO- Strength upper and lower extremities- 5/5, Sensation grossly intact globally EXTREMITIES- No pedal edema, no foot ulceration or heavy calluses SKIN- Warm, dry, No rash or lesion. PSYCH- Normal mood and affect, appropriate thought content and speech.  Assessment & Plan:   See Encounters Tab for problem based charting.  Patient discussed with Dr. Angelia Mould

## 2015-10-15 NOTE — Patient Instructions (Signed)
It was a pleasure to see you today Michael [name]. I recommend increasing your daily insulin dose to 28 units every evening. It is very important to take this every day since I believe your diabetes is currently worsening your thirst, pain, and fatigue.  You can take ibuprofen 800mg  as needed for up to 2 weeks while waiting for your pain clinic follow up but discontinue any other medicine like Mobic(meloxicam) while doing so.  I would like to see you again in the clinic in about 2 weeks and will try to set up an appointment with Butch Penny too.

## 2015-10-18 ENCOUNTER — Other Ambulatory Visit: Payer: Self-pay | Admitting: Pharmacist

## 2015-10-18 NOTE — Assessment & Plan Note (Signed)
He continues to have chronic joint pain that is maybe being compounded by some diabetic neuropathy. It sounds like he is taking additional NSAIDs on top of prescribed meloxicam with some stomach complaints.  I discussed this with him today and we will change the plain to oral ibuprofen 800mg  TID PRN for the next 2 weeks and he is trying to reschedule follow up with pain management who he had previously been seeing.

## 2015-10-18 NOTE — Progress Notes (Signed)
Internal Medicine Clinic Attending  Case discussed with Dr. Rice at the time of the visit.  We reviewed the resident's history and exam and pertinent patient test results.  I agree with the assessment, diagnosis, and plan of care documented in the resident's note.  

## 2015-10-18 NOTE — Patient Outreach (Addendum)
[place] [facility]) Care Management  10/18/2015  ABIEL ODEH [birth year] [id number]   Called patient to follow-up test strips but was unable to reach him on his cell phone due to being out of test strips.  Called patients sister/emergency contact and she stated she was not home right now but would call me when she is home with the patient.  Called [facility] and they verified that patients test strips were delivered to his home on 10/06/15.  Plan: Followup via telephone in 1-2 days  Plan for home visit in 2 weeks to fill pill box and assess medication adherence.    [name], PharmD [facility] PGY2 Pharmacy Resident [phone]    Addendum: Patients sister returned call with patient on the phone as well.  They state they never received the package from [facility] and that patient has called [facility] and told them that he did not receive them and they are sending out another package to be delivered ~10/21/15.  They state they believe the test strips had been stolen due to people taking boxes in the neighborhood they live in. Patient reported compliance with his medications and stated he has increased his Levemir to 28 units daily.  He denies signs/symptoms of hypoglycemia.  Plan: Home visit on 10/27/15 to fill pill box and assess medication adherence.  Will followup glucose test strips at this visit if patient has not obtained from [facility].

## 2015-10-18 NOTE — Assessment & Plan Note (Signed)
Lab Results  Component Value Date   HGBA1C 10.6 10/15/2015   HGBA1C 9.6 07/05/2015   HGBA1C 7.7 03/12/2015     Assessment: Diabetes control: Uncontrolled Progress toward A1C goal:  Worsened Comments: Hand recorded CBG log running mostly in 300s-500s range, with a few values down to 95 and 112 with AM glucose measurements. His diet sounds poor with largely prepackaged food. He reports intolerance of metformin due to exacerbation of his chronic diarrhea 2/2 pancreatic insufficiency. I do not know that starting another oral hypoglycemic agent such as a sulfonylurea or SGLT2 inhibitor is going to lower his average blood sugar with any less risk of hypoglycemia than increased basal insulin dosing at this time.  Plan: Medications:  Increase basal insulin from 24 to 28 units daily. Home glucose monitoring: Frequency: 3 times daily Timing: before meals Instruction/counseling given: reminded to bring blood glucose meter & log to each visit and discussed diet Other plans: Some food items provided at the visit today. He is interested in talking with diabetes educator at his next visit so ideally follow up in about 2 weeks when Butch Penny can be available.

## 2015-10-26 ENCOUNTER — Ambulatory Visit: Payer: Medicare Other | Admitting: Podiatry

## 2015-10-27 ENCOUNTER — Other Ambulatory Visit: Payer: Self-pay | Admitting: Pharmacist

## 2015-10-27 ENCOUNTER — Encounter: Payer: Self-pay | Admitting: Pharmacist

## 2015-10-27 NOTE — Patient Outreach (Addendum)
[place] [facility]) Care Management  [place]   10/27/15  Michael [name] 1955/05/25 [id number]  Subjective: 60 year old male previously seen by me for medication adherence with assistance filling his pill box. He was recently hospitalized for hyperosmolar hyperglycemic state likely due to non-adherence with his insulin on 09/03/15.  Patients metformin was stopped in 2016 due to pancreatic failure and history of chronic alcohol abuse.  During home visit today patient has been adherent on 10/14 days from pill box. He reports taking his Levemir 28 units every night and that the phone alarm helps him to remember. He states he has been gone to cousins house for 2-3 days without his medications.    CBG today 384 mg/dL  Repots that he ate a honey bun today.  He states he has an eye doctor appointment on 10/9 and podiatry appointment on 10/20.  Denies hypoglycemia.  Reports proper treatment Reports decreased nocturia to 3-4 times per night. Denies vision changes.   Reports smoking newports 5-6 cig/day. Reports having 2 cigarettes today.   Confidence rating: 6/10 can quit smoking in next year Importance rating: 6/10 to quit smoking  Drinking light beer 1 natural light every 4-5 days.   He also reports he is still having trouble obtaining transportation and is unable to reach social services via telephone.   Objective:  Morning CBG (mg/dL)- 384 (10/4), 164 (10/1), 156 (9/30), 178 (9/29), 429 (9/29), 327 (9/27), 531 (9/25) Evening CBG (mg/dL)- 249 (10/2), 156 (10/1), 149 (10/1), 297 (9/30), 181 (9/29), 227 (9/28), 584 (9/27), 506 (9/25)  Vitals:   10/27/15 1044 10/27/15 1049  BP: (!) 138/97 (!) 118/99  Pulse: (!) 127 (!) 128   10/15/15 A1C: 10.6  Encounter Medications: Outpatient Encounter Prescriptions as of 10/27/2015  Medication Sig Note  . ACCU-CHEK FASTCLIX LANCETS MISC Use to check your blood sugar 3 times a day (Patient not taking: Reported on 10/13/2015)   .  amitriptyline (ELAVIL) 75 MG tablet Take 25 mg by mouth at bedtime.   . Blood Glucose Monitoring Suppl (ACCU-CHEK AVIVA PLUS) w/Device KIT Check blood sugar up to 3 times a day (Patient not taking: Reported on 10/13/2015)   . CREON 36000 units CPEP capsule Take 1 capsule by mouth 3  times daily before meals   . folic acid (FOLVITE) 1 MG tablet Take 1 tablet (1 mg total) by mouth daily.   Marland Kitchen glucose blood (ACCU-CHEK AVIVA PLUS) test strip The patient is insulin requiring, ICD 10 code E11.42. The patient tests 4 times per day.   . ibuprofen (ADVIL,MOTRIN) 800 MG tablet TAKE 1 TABLET BY MOUTH EVERY 8 HOURS AS NEEDED FOR MODERATE PAIN   . Insulin Detemir (LEVEMIR FLEXTOUCH) 100 UNIT/ML Pen Inject 28 units daily in the evening   . Insulin Pen Needle 31G X 5 MM MISC Use to inject 15 units of Levemir one time a day   . Lancet Devices (ACCU-CHEK SOFTCLIX) lancets Use to check your blood sugar 3 times a day. Dx code E11.65. Insulin dependent.   Marland Kitchen loperamide (IMODIUM) 2 MG capsule Take 1 capsule (2 mg total) by mouth as needed for diarrhea or loose stools. No more than 8 tablets in 1 day. 09/28/2015: Has not needed  . lovastatin (MEVACOR) 40 MG tablet Take 1 tablet (40 mg total) by mouth at bedtime.   . magnesium oxide (MAG-OX) 400 (241.3 Mg) MG tablet Take 1 tablet (400 mg total) by mouth daily. (Patient not taking: Reported on 10/13/2015)   .  pantoprazole (PROTONIX) 40 MG tablet Take 1 tablet by mouth  daily   . Vitamin D, Ergocalciferol, (DRISDOL) 50000 units CAPS capsule Take 1 capsule (50,000 Units total) by mouth every 7 (seven) days.    No facility-administered encounter medications on file as of 10/27/2015.     Functional Status: In your present state of health, do you have any difficulty performing the following activities: 10/15/2015 09/17/2015  Hearing? N N  Vision? N N  Difficulty concentrating or making decisions? N N  Walking or climbing stairs? N N  Dressing or bathing? N N  Doing errands,  shopping? N Y  Conservation officer, nature and eating ? - -  Using the Toilet? - -  In the past six months, have you accidently leaked urine? - -  Do you have problems with loss of bowel control? - -  Managing your Medications? - -  Managing your Finances? - -  Housekeeping or managing your Housekeeping? - -  Some recent data might be hidden    Fall/Depression Screening: PHQ 2/9 Scores 10/15/2015 09/17/2015 08/02/2015 07/05/2015 03/12/2015 01/28/2015 12/24/2014  PHQ - 2 Score 0 0 0 0 0 0 0  PHQ- 9 Score - - - - - - -    Assessment: Medication Adherence: Patient has taken 10 of 14 days of his pill box oral medications. Patient reports he has been 100% compliant with his insulin but this is questionable. His sister does however state that patient has been taking his insulin everyday.   Diabetes: A1C remains above goal of <7% at 10.6%.  Lack of control is likely due to diet and nonadherence to medications. Patient is not currently on an aspirin.    Plan: Counseled on signs/symptoms/treatment of hypoglycemia Counsled on cardiovascular, lung and cancer risks of continuing to smoke. Patient has set goal to decrease cigarrettes to 3-4 per day over the next 2 weeks Will plan followup home visit in 2 weeks to fill pill box.  Will focus on low carbohydrate diet and will bring requested blood glucose log and ensure/glucerna coupons.  Will followup with Dr Benjamine Mola about patients CBGs, aspirin, and tachycardia  [name], PharmD [facility] PGY2 Pharmacy Resident [phone]

## 2015-11-03 ENCOUNTER — Other Ambulatory Visit: Payer: Self-pay | Admitting: Licensed Clinical Social Worker

## 2015-11-03 NOTE — Patient Outreach (Signed)
[place] [facility]) Care Management  11/03/2015  AMADO RICHCREEK September 21, 1955 [id number]   Assessment- CSW received message from [place] that stated that patient still is having issues scheduling rides with Medicaid transportation and wanted to know what information he could provide to patient.   CSW completed outreach to patient and was unable to reach him. CSW left a HIPPA compliant voice message with the requested information and also sent message back to [facility] Pharmacist with requested information.  CSW left on voice message that sometimes there is a long wait time when having to schedule transportation through [facility]. The advice I was given by a caseworker in the past was to have patient put his phone on speaker and go about his day while waiting to speak to someone. However, I strongly suggested to patient already that he contact his Medicaid caseworker in the first place to ensure that he can start using Medicaid transportation benefits because there is a process to that. I also suggested that patient call DSS early in the morning if at all possible.   Since he already has [employer] and Medicaid and is still having trouble. I contacted [employer] and made a referral. However, (they usually only go within the city limits) but if they have a volunteer that day who is available and willing to pick him up then he can use this as a last resort. I informed patient on the voice message that he is only eligible for 1 ride per week through [employer] and it is only for medical appointments. I informed patient that he must call one week in advance to set up transportation as they will need to check and see if the volunteer driving that day is willing to pick him up since he is outside city limits. He will receive this packet in the mail and will just have to sign and date two forms and put back in an envelope that they provide and mail off. CSW provided this resource information on voice  message. CSW informed patient that he can contact this CSW with any questions if needed.  Plan-CSW will not re open case. CSW has provided education on transportation resources and completed referral to a new transportation service that he can use.  Eula Fried, BSW, MSW, [email or web address] Phone: [phone] Fax: [phone]

## 2015-11-04 ENCOUNTER — Other Ambulatory Visit: Payer: Self-pay | Admitting: Internal Medicine

## 2015-11-05 ENCOUNTER — Ambulatory Visit: Payer: Medicare Other

## 2015-11-05 ENCOUNTER — Ambulatory Visit: Payer: Medicare Other | Admitting: Dietician

## 2015-11-06 ENCOUNTER — Other Ambulatory Visit: Payer: Self-pay | Admitting: Internal Medicine

## 2015-11-08 ENCOUNTER — Other Ambulatory Visit: Payer: Self-pay | Admitting: Licensed Clinical Social Worker

## 2015-11-08 NOTE — Patient Outreach (Signed)
[facility]) Care Management  11/08/2015  WILLMER HILTON 07-16-1955 [id number]   Assessment- CSW received call from [place] Management Assistant stating that patient had called requesting transportation arrangements for appointments next week. CSW informed her that he is not eligible for [facility] transportation services due to having Medicaid and self reported having SCAT. CSW completed call to [facility] Pharmacist and RNCM and updated them on the situation.  CSW completed call to patient. Patient answered. Patient understands that he must now use either [employer] or [place] as a stable source of transportation. CSW informed him that he will need to contact DSS to find out his caseworkers name and they will transfer him to him/her. He will then need to tell them that he wishes to start using transportation services through them. He can ask them for advice and how to better reach them but CSW advised him to stay on the line as long as possible because they will eventually answer.   CSW questioned if he received packet from [employer] for [employer] and he stated no. CSW encouraged patient to check mail daily and to contact this CSW back if he does not receive it. CSW provided instructions on how to schedule transportation rides through them and to contact one week in advance as they have to ensure that a volunteer that day will be able to pick him up from [employer]. CSW also informed him that he must sign and date a consent form in the [employer] and mail back and then he can start using services. Patient expressed understanding.  Plan-CSW will not re open case. Social work assistance provided.  Eula Fried, BSW, MSW, [email or web address] Phone: [phone] Fax: [phone]

## 2015-11-09 ENCOUNTER — Other Ambulatory Visit: Payer: Self-pay | Admitting: *Deleted

## 2015-11-09 ENCOUNTER — Encounter: Payer: Self-pay | Admitting: *Deleted

## 2015-11-09 NOTE — Patient Outreach (Signed)
[facility]) Care Management [place] Telephone Outreach Patient cancelled previously scheduled Routine Home Visit for today 11/09/2015  PASON BRADNEY 1955-04-29 [id number]  DENARI MANGLICMOT is an 60 y.o. male referred to [facility] for transition of care afterIP hospital visit August 10-11, 2017 for AMS and uncontrolled DM; patient was treated with IV fluids/ insulin during his IP hospital visit. Patient has history of HTN, DM, ETOH abuse with chronic pancreatitis and abdominal pain. From review of EMR, apparently patient has history of noncompliance with his medication adherence as well; [place] actively involved in patient's care.   Received notification from [facility] pharmacist, Joseph Berkshire, who has been actively working with patient on medication adherence/ compliance, that patient had contacted him to cancel today's previously scheduled routine home visit.  Successfully connected with patient by telephone, HIPAA/ identity verified.  Today, patient reports that he "is doing great," and has been checking his blood sugars regularly and monitoring his BP as well, stating, "everything is better, and is under control."  Rubens reports that he has continued actively working with [facility] Pharmacist, [name], and verbalizes that "it has helped so much."  Patient reports that he has all of his medications and is taking as prescribed, "with Kelsey's help."  Chai also reports today that his previous issues with transportation "have all worked out."  I shared with Rian to stay in close contact with his medicaid case worker regarding any ongoing transportation issues, as previously advised by Eula Fried, [place], and he agreed to do so.  Mickie stated that he reviewed the educational material from last [place] home visit, and denies questions today.  I shared with patient that [name] would provide him with additional educational materials that had previously been  printed for today's home visit, when [place] visits him later this week.  Patient denies further needs, questions, issues or problems today, and verbalizes that he does not believe additional in-home visits by [facility] RN CM are necessary, as he believes "everything is straightening out," and he voices no additional care coordination needs at this time.  I confirmed with patient that he has my direct telephone number, the main office number for [facility] CM, and the [facility] 24-hour nurse advice line phone number, should he wish to contact [facility] CM in the future.   Plan:   Mr. Felipe will take his medications as they are prescribed and will attend all scheduled provider appointments.  Mr. Chorley will use his established resources for the transportation he needs for his provider appointments.  Mr. Gruhlke will continue monitoring and recording his blood sugar reading three times every day.  Mr. Bauguess will work with [place] to have his needs around medication compliance/ adherence/ assistance addressed.  Mr. Ditoro will promptly contact his providers for any new concerns, issues, or problems.  [facility] RN CM will close case, as patient denies ongoing care coordination needs, and will make [place], and patient's PCP aware of same.   [name], RN, BSN, [employer] [facility] Care Management  [phone]

## 2015-11-10 ENCOUNTER — Telehealth: Payer: Self-pay | Admitting: Licensed Clinical Social Worker

## 2015-11-10 NOTE — Telephone Encounter (Signed)
CSW received voice mail from Mr. Rygg.  CSW unable to make out complete message.   CSW placed called to pt.  CSW left message requesting return call. CSW provided contact hours and phone number.

## 2015-11-11 NOTE — Telephone Encounter (Signed)
Return call from Mr. Gaertner.  Pt states he called just to provide CSW an update of upcoming appointments.  He has secured transportation through [employer] and has their phone number.  Pt states [facility] care management is going well for him and it has been a benefit.

## 2015-11-12 ENCOUNTER — Other Ambulatory Visit: Payer: Self-pay | Admitting: Pharmacist

## 2015-11-12 ENCOUNTER — Telehealth: Payer: Self-pay | Admitting: Internal Medicine

## 2015-11-12 DIAGNOSIS — H2513 Age-related nuclear cataract, bilateral: Secondary | ICD-10-CM | POA: Diagnosis not present

## 2015-11-12 DIAGNOSIS — E119 Type 2 diabetes mellitus without complications: Secondary | ICD-10-CM | POA: Diagnosis not present

## 2015-11-12 DIAGNOSIS — H40013 Open angle with borderline findings, low risk, bilateral: Secondary | ICD-10-CM | POA: Diagnosis not present

## 2015-11-12 NOTE — Telephone Encounter (Signed)
APT. REMINDER CALL, LMTCB °

## 2015-11-12 NOTE — Patient Outreach (Addendum)
[facility] ([facility]) Care Management  [facility] CM Pharmacy   11/12/2015  Michael [name] 03/06/1955 [id number]  Subjective: 59 year old male previously seen by me for medication adherence with assistance filling his pill box. He was recently hospitalized for hyperosmolar hyperglycemic state likely due to non-adherence with his insulin on 09/03/15. Patients metformin was stopped in 2016 due to pancreatic failure and history of chronic alcohol abuse.   Today he reports missing only 1 dose of Levemir and has taken his medications on 11/14 days since my last visit.  He attributes missing these medications due to being gone on his birthday.  Today he states he left his blood glucose meter at his girlfriends house but does state that he has been checking 2-3 times a day.  Reports highest CBG he has seen 285-300 mg/dL and the lowest CBG he has had was around 90 mg/dL.  Reports he has been checking his CBG 3 times per day.  He denies hypoglycemia signs or symptoms and reports proper knowledge of treatment.  He reports no blood glucose over the past week <100 or <150 mg/dL.  Reports tingling/numbness in left arm but denies neuropathy in feet/ankles.  He states he picked up a prescription for ibuprofen last week and states this has helped with his chronic pain.   He states that he has set up his transportation to his upcoming visits with Dr Rice, podiatry and his pain clinic.    Objective:   Encounter Medications: Outpatient Encounter Prescriptions as of 11/12/2015  Medication Sig Note  . ACCU-CHEK FASTCLIX LANCETS MISC Use to check your blood sugar 3 times a day   . amitriptyline (ELAVIL) 75 MG tablet Take 75 mg by mouth 2 (two) times daily.    . Blood Glucose Monitoring Suppl (ACCU-CHEK AVIVA PLUS) w/Device KIT Check blood sugar up to 3 times a day   . CREON 36000 units CPEP capsule TAKE 1 CAPSULE BY MOUTH 3  TIMES DAILY BEFORE MEALS   . folic acid (FOLVITE) 1 MG tablet Take 1 tablet (1 mg  total) by mouth daily.   . gabapentin (NEURONTIN) 300 MG capsule TAKE 3 CAPSULES BY MOUTH  EVERY MORNING AND  AFTERNOON. TAKE 4 CAPSULES  BY MOUTH AT NIGHT BEFORE  BED   . glucose blood (ACCU-CHEK AVIVA PLUS) test strip The patient is insulin requiring, ICD 10 code E11.42. The patient tests 4 times per day.   . ibuprofen (ADVIL,MOTRIN) 800 MG tablet TAKE 1 TABLET BY MOUTH EVERY 8 HOURS AS NEEDED FOR MODERATE PAIN   . Insulin Detemir (LEVEMIR FLEXTOUCH) 100 UNIT/ML Pen Inject 28 units daily in the evening   . Insulin Pen Needle 31G X 5 MM MISC Use to inject 15 units of Levemir one time a day   . Lancet Devices (ACCU-CHEK SOFTCLIX) lancets Use to check your blood sugar 3 times a day. Dx code E11.65. Insulin dependent.   . loperamide (IMODIUM) 2 MG capsule Take 1 capsule (2 mg total) by mouth as needed for diarrhea or loose stools. No more than 8 tablets in 1 day. 09/28/2015: Has not needed  . lovastatin (MEVACOR) 40 MG tablet TAKE 1 TABLET BY MOUTH AT  BEDTIME   . pantoprazole (PROTONIX) 40 MG tablet TAKE 1 TABLET BY MOUTH  DAILY   . magnesium oxide (MAG-OX) 400 (241.3 Mg) MG tablet Take 1 tablet (400 mg total) by mouth daily. (Patient not taking: Reported on 11/12/2015)   . Vitamin D, Ergocalciferol, (DRISDOL) 50000 units CAPS capsule Take   1 capsule (50,000 Units total) by mouth every 7 (seven) days. (Patient not taking: Reported on 11/12/2015)    No facility-administered encounter medications on file as of 11/12/2015.     Functional Status: In your present state of health, do you have any difficulty performing the following activities: 11/12/2015 10/15/2015  Hearing? N N  Vision? N N  Difficulty concentrating or making decisions? Y N  Walking or climbing stairs? N N  Dressing or bathing? N N  Doing errands, shopping? N N  Preparing Food and eating ? - -  Using the Toilet? - -  In the past six months, have you accidently leaked urine? - -  Do you have problems with loss of bowel control? - -   Managing your Medications? - -  Managing your Finances? - -  Housekeeping or managing your Housekeeping? - -  Some recent data might be hidden    Fall/Depression Screening: PHQ 2/9 Scores 10/15/2015 09/17/2015 08/02/2015 07/05/2015 03/12/2015 01/28/2015 12/24/2014  PHQ - 2 Score 0 0 0 0 0 0 0  PHQ- 9 Score - - - - - - -    Assessment: Medication Adherence: Patient has taken 11of 14days of his pill box oral medications. Patient reports he has been  compliant with his insulin with only missing one dose but this is questionable.  He is currently out of amitriptyline, magnesium and vitamin D and these medications were not filled in his pill box.    Diabetes: A1C remains above goal of <7% at 10.6%.  Lack of control is likely due to diet and nonadherence to medications. Patient is not currently on an aspirin. He denies hypoglycemia and states that most of his blood glucose readings are in the 200-300 range and he denies readings <150 mg/dL even though he left his meter at his girlfriends house.  Performed diabetic foot exam with monofilament and patient has decreased sensation his feet with very calloused feet.  He does have followup with podiatry in the next few weeks.    Plan: Filled 2 week supply of his medications except for amitriptyline, magnesium and vitamin D as he is currently out of these medications.  Counseled on signs/symptoms/treatment of hypoglycemia.   Extensively discussed low carbohydrate diet including low carbohydrate food options, the plate method, and how to read a nutrition facts food label.   Following discussion with Dr Rice I have instructed patient to increase his Levemir to 32 units daily and to pick up aspirin 81 mg daily over the counter to start taking everyday.  Patient has decided to maintain his goal to decrease cigarrettes to 3-4 per day over the next 2 weeks Will plan followup home visit in 2 weeks to fill pill box.  Per [facility] Amitriptyline was shipped on  11/12/15 and [facility] pharmacy will fill his Vitamin D on 11/15/15.  Neither pharmacies have a current prescription for Magnesium and will recommend to Dr Rice to [facility] new prescription [facility]  Instructed patient to obtain his blood glucose meter from his girlfriends house and continue checking his blood glucose.  I stressed that It was very important for him to take his meter to his visit with Dr Rice. Will followup with patient in 1 week to see if he has received his medications at his new cell phone number [phone] Will plan home visit in 2 weeks to fill pill box.   Kelsy Combs, PharmD [facility] PGY2 Pharmacy Resident [phone]  [facility] CM Care Plan Problem One   Flowsheet   Row Most Recent Value  Care Plan Problem One  Non Adherence to Medications as evidenced by pill box at home visits  Role Documenting the Problem One  [place] for Problem One  Active  [facility] CM Short Term Goal #1 (0-30 days)  Patient will utilize his pill box 90% of the time as evidenced by pharmacist review of remaining medications in pill box over the next 2 weeks  [facility] CM Short Term Goal #1 Start Date  10/13/15  Interventions for Short Term Goal #1  Pharmacist will fill pill boxes and will teach patient how to fill his own pill box.  Education provided on need for proper adherence and proper storage of medications.  Counseled on importance of adherence to his regimen and taking his medications as prescribed to prevent furture ED visits and hospitalizations.  plan to teach patient how to fill his pill box at the next visit once patient shows that he can be adherent to his regimen. Discussed adherence with patients sister and she plans to help him with his medications.  Patient will put an "x" beside the date where he is writing down his blood glucose on the days that he takes his Levemir.     [facility] CM Care Plan Problem Two   Flowsheet Row Most Recent Value  Care Plan Problem Two  Tobacco Cessation  Role  Documenting the Problem Two  Clinical Pharmacist  Care Plan for Problem Two  Active  [facility] CM Short Term Goal #1 (0-30 days)  Patient will decrease cigarrette use to 3-4 per day over the next 14 days as evidenced by patient report  [facility] CM Short Term Goal #1 Start Date  11/12/15  Interventions for Short Term Goal #2   Counseled on cardiovascular, lung and cancer risks of continuing to smoke.  Utilized motivational interviewing to set cigarrette use decrease goal.

## 2015-11-15 ENCOUNTER — Ambulatory Visit (INDEPENDENT_AMBULATORY_CARE_PROVIDER_SITE_OTHER): Payer: Medicare Other | Admitting: Internal Medicine

## 2015-11-15 VITALS — BP 143/73 | HR 96 | Temp 98.0°F | Wt 153.6 lb

## 2015-11-15 DIAGNOSIS — M170 Bilateral primary osteoarthritis of knee: Secondary | ICD-10-CM

## 2015-11-15 DIAGNOSIS — Z79899 Other long term (current) drug therapy: Secondary | ICD-10-CM

## 2015-11-15 DIAGNOSIS — F17210 Nicotine dependence, cigarettes, uncomplicated: Secondary | ICD-10-CM

## 2015-11-15 DIAGNOSIS — I739 Peripheral vascular disease, unspecified: Secondary | ICD-10-CM

## 2015-11-15 MED ORDER — DICLOFENAC SODIUM 1 % TD GEL: 4.0000 g | Freq: Four times a day (QID) | TRANSDERMAL | 1 refills | Status: DC

## 2015-11-15 MED ORDER — DICLOFENAC SODIUM 1 % TD GEL: 2.0000 g | Freq: Four times a day (QID) | TRANSDERMAL | 1 refills | Status: DC

## 2015-11-15 NOTE — Patient Instructions (Addendum)
Mr. Lurry it was nice meeting you today.  -STOP taking Ibuprofen  -Start using Voltaren get 4 times a day as instructed.  -I have also given you a referral for physical therapy.

## 2015-11-16 ENCOUNTER — Other Ambulatory Visit: Payer: Self-pay | Admitting: Internal Medicine

## 2015-11-16 ENCOUNTER — Telehealth: Payer: Self-pay

## 2015-11-16 ENCOUNTER — Telehealth: Payer: Self-pay | Admitting: *Deleted

## 2015-11-16 ENCOUNTER — Ambulatory Visit (INDEPENDENT_AMBULATORY_CARE_PROVIDER_SITE_OTHER): Payer: Medicare Other | Admitting: Podiatry

## 2015-11-16 DIAGNOSIS — E1142 Type 2 diabetes mellitus with diabetic polyneuropathy: Secondary | ICD-10-CM

## 2015-11-16 DIAGNOSIS — E1149 Type 2 diabetes mellitus with other diabetic neurological complication: Secondary | ICD-10-CM | POA: Diagnosis not present

## 2015-11-16 DIAGNOSIS — E0810 Diabetes mellitus due to underlying condition with ketoacidosis without coma: Secondary | ICD-10-CM

## 2015-11-16 DIAGNOSIS — B351 Tinea unguium: Secondary | ICD-10-CM | POA: Diagnosis not present

## 2015-11-16 DIAGNOSIS — M79676 Pain in unspecified toe(s): Secondary | ICD-10-CM

## 2015-11-16 DIAGNOSIS — E612 Magnesium deficiency: Secondary | ICD-10-CM

## 2015-11-16 DIAGNOSIS — Z794 Long term (current) use of insulin: Principal | ICD-10-CM

## 2015-11-16 MED ORDER — MAGNESIUM OXIDE 400 (241.3 MG) MG PO TABS: 400.0000 mg | ORAL_TABLET | Freq: Every day | ORAL | 3 refills | Status: DC

## 2015-11-16 MED ORDER — ACCU-CHEK AVIVA PLUS W/DEVICE KIT: PACK | 1 refills | Status: DC

## 2015-11-16 MED ORDER — GLUCOSE BLOOD VI STRP: ORAL_STRIP | 3 refills | Status: DC

## 2015-11-16 NOTE — Assessment & Plan Note (Signed)
History of present illness Patient reports having chronic bilateral knee pain for which he is currently taking ibuprofen 800 mg once daily. States he continues to have 5-6 out of 10 pain with this medication. States he tried taking ibuprofen 800 mg 3 times a day but it caused him to have an upset stomach/ diarrhea.  Assessment Severe bilateral knee osteoarthritis. X-ray of knees done in January 2017 showing tricompartmental degenerative change bilaterally. X-ray of left knee also showing a possible osteophyte.  Plan -Discontinue ibuprofen -Start Voltaren gel 4% 4 times a day -Referral to physical therapy -If patient continues to complain of knee pain at future visit, consider giving joint corticosteroid injections.

## 2015-11-16 NOTE — Progress Notes (Signed)
   CC: Patient is here to discuss his chronic bilateral knee pain.  HPI:  [name] is a 60 y.o. male with a past medical history of conditions listed below presenting to the clinic to discuss his chronic bilateral knee pain. Please see problem based charting for the status of the patient's current and chronic medical conditions.   Past Medical History:  Diagnosis Date  . Diabetes mellitus without complication ([place])   . Pancreatitis   . Seizures ([place])     Review of Systems:  Pertinent positives mentioned in HPI. Remainder of all ROS negative.   Physical Exam:  Vitals:   11/15/15 1338  BP: (!) 143/73  Pulse: 96  Temp: 98 F (36.7 C)  TempSrc: Oral  SpO2: 100%  Weight: 153 lb 9.6 oz (69.7 kg)   Physical Exam  Constitutional: He is oriented to person, place, and time. No distress.  HENT:  Head: Normocephalic and atraumatic.  Mouth/Throat: Oropharynx is clear and moist.  Eyes: EOM are normal.  Neck: Neck supple. No tracheal deviation present.  Cardiovascular: Normal rate, regular rhythm and intact distal pulses.   Pulmonary/Chest: Effort normal and breath sounds normal. No respiratory distress.  Abdominal: Soft. Bowel sounds are normal. He exhibits no distension. There is no tenderness.  Musculoskeletal:  Knees have normal range of motion. Crepitus was noted bilaterally. No erythema, swelling, increased warmth, or effusion noted.  Neurological: He is alert and oriented to person, place, and time.  Skin: Skin is warm and dry.    Assessment & Plan:   See Encounters Tab for problem based charting.  Patient discussed with Dr. Angelia Mould

## 2015-11-16 NOTE — Telephone Encounter (Signed)
Pt was seen in [facility] yesterday and stated that he had lost his glucose testing supplies.  His is now asking for a refill to be called into his local pharmacy.  I explained to patient that his insurance may not cover the cost of supplies if they had recently pd a claim.  Rx for meter, lancets, and strips phoned into pharmacy, phone call complete.Despina Hidden Cassady10/24/201711:19 AM

## 2015-11-16 NOTE — Assessment & Plan Note (Addendum)
Assessment X-rays of knees done in January 2017 showing evidence of peripheral vascular disease in both his lower extremities. Patient does have risk factors for peripheral vascular disease including age, smoking, uncontrolled type 2 diabetes (A1c 10.6 in 09/2015), hypertension, and hyperlipidemia.  Plan -Consider ordering ABI at future visit -Risk factor modification

## 2015-11-16 NOTE — Progress Notes (Signed)
Patient ID: Michael [name], male   DOB: 1955-04-11, 60 y.o.   MRN: [id number] Complaint:  Visit Type: Patient returns to my office for continued preventative foot care services. Complaint: Patient states" my nails have grown long and thick and become painful to walk and wear shoes" Patient has been diagnosed with DM with neuropathy. The patient presents for preventative foot care services. No changes to ROS.  Patient says he still has neuropathy pain.  Podiatric Exam: Vascular: dorsalis pedis and posterior tibial pulses are palpable bilateral. Capillary return is immediate. Temperature gradient is WNL. Skin turgor WNL  Sensorium: Decreased  Semmes Weinstein monofilament test. Normal tactile sensation bilaterally. Nail Exam: Pt has thick disfigured discolored nails with subungual debris noted bilateral entire nail hallux through fifth toenails Ulcer Exam: There is no evidence of ulcer or pre-ulcerative changes or infection. Orthopedic Exam: Muscle tone and strength are WNL. No limitations in general ROM. No crepitus or effusions noted. Foot type and digits show no abnormalities. Bony prominences are unremarkable. Skin: No Porokeratosis. No infection or ulcers.  Plantar skin xerosis.  Diagnosis:  Onychomycosis, , Pain in right toe, pain in left toes  Treatment & Plan Procedures and Treatment: Consent by patient was obtained for treatment procedures. The patient understood the discussion of treatment and procedures well. All questions were answered thoroughly reviewed. Debridement of mycotic and hypertrophic toenails, 1 through 5 bilateral and clearing of subungual debris. No ulceration, no infection noted. . Return Visit-Office Procedure: Patient instructed to return to the office for a follow up visit 3 months for continued evaluation and treatment.    Gardiner Barefoot DPM

## 2015-11-17 ENCOUNTER — Telehealth: Payer: Self-pay

## 2015-11-17 NOTE — Telephone Encounter (Signed)
Michael [name] from [facility] needs to speak with a nurse.

## 2015-11-18 NOTE — Telephone Encounter (Signed)
Called [facility] back, pharm stated pt's insurance will not pay for new supplies

## 2015-11-19 ENCOUNTER — Other Ambulatory Visit: Payer: Self-pay | Admitting: Pharmacist

## 2015-11-19 ENCOUNTER — Encounter: Payer: Self-pay | Admitting: Pharmacist

## 2015-11-19 ENCOUNTER — Encounter: Payer: Self-pay | Admitting: Internal Medicine

## 2015-11-19 NOTE — Progress Notes (Signed)
Internal Medicine Clinic Attending  Case discussed with Dr. Rathoreat the time of the visit. We reviewed the resident's history and exam and pertinent patient test results. I agree with the assessment, diagnosis, and plan of care documented in the resident's note.  

## 2015-11-19 NOTE — Patient Outreach (Addendum)
[facility]) Care Management  11/19/2015  Michael [name] Apr 03, 1955 [id number]  60 year old male referred to me for medication adherence and I saw him on 11/12/15 in his home to fill his pill box.  Called him to today to followup his blood glucose values and to followup his amitriptyline, vitamin D, and magnesium prescriptions which were supposed to be shipped to his home.  Was unable to reach [name] via telephone and left voicemail for him to return my call.     Plan:  Will followup via telephone next week and plan home visit for next Friday.    [name], PharmD [facility] PGY2 Pharmacy Resident [phone]

## 2015-11-22 ENCOUNTER — Other Ambulatory Visit: Payer: Self-pay | Admitting: Internal Medicine

## 2015-11-22 DIAGNOSIS — E1142 Type 2 diabetes mellitus with diabetic polyneuropathy: Secondary | ICD-10-CM

## 2015-11-23 ENCOUNTER — Ambulatory Visit: Payer: Self-pay | Admitting: Pharmacist

## 2015-11-23 ENCOUNTER — Other Ambulatory Visit: Payer: Self-pay | Admitting: Pharmacist

## 2015-11-23 ENCOUNTER — Other Ambulatory Visit: Payer: Self-pay

## 2015-11-23 NOTE — Patient Outreach (Signed)
[facility]) Care Management  11/23/2015  Michael [name] [birth year] [id number]   60 year old male referred to me for medication adherence and I saw him on 11/12/15 in his home to fill his pill box.  Called him to today (Unsuccessful outreach number 2) to followup his blood glucose values and to followup his amitriptyline, vitamin D, and magnesium prescriptions which were supposed to be shipped to his home.  Was unable to reach [name] via telephone and left voicemail for him to return my call.     Plan:  Will followup via telephone in 1-2 days and still plan home visit for Friday.   [name], PharmD [facility] PGY2 Pharmacy Resident [phone]

## 2015-11-23 NOTE — Telephone Encounter (Signed)
Pt called Michael [name].  Pt states message left for refill earlier and pharmacy states medications has not been refilled.  Michael [name] informed Mr. Wachtler triage nurse returned call to him but received voice mail.  Pt requesting refill for Mag-Ox.

## 2015-11-23 NOTE — Telephone Encounter (Signed)
Please call pt regarding meds.

## 2015-11-23 NOTE — Telephone Encounter (Signed)
Called back, got vmail, lm for rtc

## 2015-11-23 NOTE — Telephone Encounter (Signed)
I called [facility], they states insurance will not pay for mag oxide because it is over the counter and should be purchased as so, I have tried to call twice today, both ph# , i've gotten vmails at both and left messages. I went over all his meds with [facility]

## 2015-11-24 ENCOUNTER — Other Ambulatory Visit: Payer: Self-pay | Admitting: Pharmacist

## 2015-11-24 NOTE — Patient Outreach (Signed)
[facility]) Care Management  11/25/2015  Michael [name] November 23, 1955 [id number]   60 year old male referred to [facility] for medication adherence and was last seen on 11/12/15 for a 2 week pill box fill. Today via telephone he states that he will not be home on Friday morning or afternoon and wishes to reschedule home visit and pill box fill for next Tuesday.  He also states he has left his blood glucose strips at his girlfriends house.    Reports highest blood glucose ~315 mg/dL and lowest blood glucose of ~150 mg/dL but does have questionable adherence to checking his blood glucose.  Reports that he has been adherent to 32 units of Levemir daily. Denies signs/symptoms of hypoglycemia.   States [facility] pharmacy is coming today to bring his amitryptyline and one of his other medications. He has not picked up aspirin 81 mg from over the counter at the pharmacy yet.   Plan: Instructed patient to obtain his blood glucose strips from his girlfriends house so he can regularly check his blood glucose Instructed patient to go to the pharmacy and pick up Aspirin 81 mg to take daily and he stated that he planned to do that in the next day or two.  Will plan home visit for next week to assess adherence and to fill his pill box.  [name], PharmD [facility] PGY2 Pharmacy Resident [phone]  [facility] CM Care Plan Problem One   Flowsheet Row Most Recent Value  Care Plan Problem One  Non Adherence to Medications as evidenced by pill box at home visits  Role Documenting the Problem One  [place] for Problem One  Active  THN CM Short Term Goal #1 (0-30 days)  Patient will utilize his pill box 90% of the time as evidenced by pharmacist review of remaining medications in pill box over the next 2 weeks  THN CM Short Term Goal #1 Start Date  11/24/15  Interventions for Short Term Goal #1  Pharmacist will fill pill boxes and will teach patient how to fill his own pill  box.  Education provided on need for proper adherence and proper storage of medications.  Counseled on importance of adherence to his regimen and taking his medications as prescribed to prevent furture ED visits and hospitalizations.  plan to teach patient how to fill his pill box at the next visit once patient shows that he can be adherent to his regimen. Discussed adherence with patients sister and she plans to help him with his medications.  Patient will put an "x" beside the date where he is writing down his blood glucose on the days that he takes his Levemir.     [facility] CM Care Plan Problem Two   Flowsheet Row Most Recent Value  Care Plan Problem Two  Tobacco Cessation  Role Documenting the Problem Two  Clinical Pharmacist  Care Plan for Problem Two  Active  THN CM Short Term Goal #1 (0-30 days)  Patient will decrease cigarrette use to 3-4 per day over the next 14 days as evidenced by patient report  THN CM Short Term Goal #1 Start Date  11/12/15  Interventions for Short Term Goal #2   Counseled on cardiovascular, lung and cancer risks of continuing to smoke.  Utilized motivational interviewing to set cigarrette use decrease goal.

## 2015-11-26 ENCOUNTER — Ambulatory Visit: Payer: Self-pay | Admitting: Pharmacist

## 2015-11-30 ENCOUNTER — Other Ambulatory Visit: Payer: Self-pay | Admitting: Pharmacist

## 2015-11-30 NOTE — Patient Outreach (Signed)
[facility]) Care Management  [place]   11/30/2015  Michael [name] 1955/08/27 [id number]  Subjective: 60 year old male previously seen by me for medication adherence with assistance filling his pill box. Patient was last seen for pill box fill by Michael [name] on 11/12/15 for a 2 week pill box fill and is now being seen > 2 weeks later due to patient request for rescheduling.  Upon review of pill box patient has missed 3 of 14 days but there has been 17 days since the pill box was filled.  He reports he forgets to take his oral medications when he goes to his girlfriends house but states he does take his insulin.  He reports not missing any doses of his insulin.   Does not have his meter with him and states he left it at his girlfriends house.  He reports he has been checking his blood glucose 2-3 times per day. Reports lowest blood glucose of 203 and highest blood glucose of 303 mg/dL.  Denies s/sx of hypoglycemia.  Reports checking his feet daily.    Today he complains of being drowsy during the day and states he would like to change his amitriptyline back to 150 mg at bedtime (previously preferred 75 mg twice daily).   He also states he has decreased his cigarettes to 3 per day.   Objective:   Encounter Medications: Outpatient Encounter Prescriptions as of 11/30/2015  Medication Sig Note  . ACCU-CHEK FASTCLIX LANCETS MISC Use to check your blood sugar 3 times a day   . amitriptyline (ELAVIL) 50 MG tablet Take 150 mg by mouth at bedtime.   . Blood Glucose Monitoring Suppl (ACCU-CHEK AVIVA PLUS) w/Device KIT Check blood sugar up to 3 times a day   . CREON 36000 units CPEP capsule TAKE 1 CAPSULE BY MOUTH 3  TIMES DAILY BEFORE MEALS   . diclofenac sodium (VOLTAREN) 1 % GEL Apply 4 g topically 4 (four) times daily.   . folic acid (FOLVITE) 1 MG tablet Take 1 tablet (1 mg total) by mouth daily.   Marland Kitchen gabapentin (NEURONTIN) 300 MG capsule TAKE 3 CAPSULES BY MOUTH  EVERY  MORNING AND  AFTERNOON. TAKE 4 CAPSULES  BY MOUTH AT NIGHT BEFORE  BED   . glucose blood (ACCU-CHEK AVIVA PLUS) test strip The patient is insulin requiring, ICD 10 code E11.42. The patient tests 4 times per day.   . ibuprofen (ADVIL,MOTRIN) 800 MG tablet Take 1 tablet by mouth every 8 (eight) hours as needed for moderate pain.   . Insulin Detemir (LEVEMIR FLEXTOUCH) 100 UNIT/ML Pen Inject 32 Units into the skin at bedtime.   . Insulin Pen Needle 31G X 5 MM MISC Use to inject 15 units of Levemir one time a day   . Lancet Devices (ACCU-CHEK SOFTCLIX) lancets Use to check your blood sugar 3 times a day. Dx code E11.65. Insulin dependent.   Marland Kitchen loperamide (IMODIUM) 2 MG capsule TAKE 1 CAPSULE BY MOUTH AS  NEEDED FOR DIARRHEA OR  LOOSE STOOLS. NO MORE THAN  8 CAPSULES IN 1 DAY.   Marland Kitchen lovastatin (MEVACOR) 40 MG tablet TAKE 1 TABLET BY MOUTH AT  BEDTIME   . pantoprazole (PROTONIX) 40 MG tablet TAKE 1 TABLET BY MOUTH  DAILY   . Vitamin D, Ergocalciferol, (DRISDOL) 50000 units CAPS capsule Take 1 capsule (50,000 Units total) by mouth every 7 (seven) days.   . [DISCONTINUED] amitriptyline (ELAVIL) 25 MG tablet TAKE 1 TABLET BY MOUTH AT  BEDTIME 11/30/2015: Taking 75 mg BID  . magnesium oxide (MAG-OX) 400 (241.3 Mg) MG tablet Take 1 tablet (400 mg total) by mouth daily. (Patient not taking: Reported on 11/30/2015)    No facility-administered encounter medications on file as of 11/30/2015.     Functional Status: In your present state of health, do you have any difficulty performing the following activities: 11/30/2015 11/12/2015  Hearing? - N  Vision? - N  Difficulty concentrating or making decisions? - Y  Walking or climbing stairs? - N  Dressing or bathing? - N  Doing errands, shopping? - N  Conservation officer, nature and eating ? N -  Using the Toilet? N -  In the past six months, have you accidently leaked urine? N -  Do you have problems with loss of bowel control? - -  Managing your Medications? Y -  Managing  your Finances? N -  Housekeeping or managing your Housekeeping? N -  Some recent data might be hidden    Fall/Depression Screening: PHQ 2/9 Scores 11/30/2015 10/15/2015 09/17/2015 08/02/2015 07/05/2015 03/12/2015 01/28/2015  PHQ - 2 Score 1 0 0 0 0 0 0  PHQ- 9 Score - - - - - - -    Assessment: Medication Adherence: Patient has been adherent to his oral medications 11/17 days since last pillbox fill.  He states adherence to his insulin and checking his blood glucose but true adherence to these are unknown especially since he continually states he has left his blood glucose meter at another location (girlfriends house).   Plan: Counseled on importance of taking his medications as prescribed and to take his blood glucose meter and oral medications when he goes to other places to stay. Instructed patient to bring blood glucose meter home prior to my next home visit Congratulated patient on decreasing his cigarette use to 3 per day and encouraged him to continue to decrease use.  Will notify Michael Pares, NP (pain clinic) of his drowsiness which is likely related to him taking amitriptyline 75 mg twice daily and we have now switched his pill box back to 150 mg at bedtime. Will also provide her with current medication list.   Filled 2 weeks of medications in his pill box Will plan home visit in 2 weeks to assess medication adherence.   Michael [name], PharmD [name] PGY2 Pharmacy Resident [phone]  [facility] CM Care Plan Problem One   Flowsheet Row Most Recent Value  Care Plan Problem One  Non Adherence to Medications as evidenced by pill box at home visits  Role Documenting the Problem One  [place] for Problem One  Active  THN CM Short Term Goal #1 (0-30 days)  Patient will utilize his pill box 90% of the time as evidenced by pharmacist review of remaining medications in pill box over the next 2 weeks  THN CM Short Term Goal #1 Start Date  11/30/15  Interventions for Short Term  Goal #1  Pharmacist will fill pill boxes and will teach patient how to fill his own pill box.  Education provided on need for proper adherence and proper storage of medications.  Counseled on importance of adherence to his regimen and taking his medications as prescribed to prevent furture ED visits and hospitalizations.  plan to teach patient how to fill his pill box at the next visit once patient shows that he can be adherent to his regimen. Discussed adherence with patients sister and she plans to help him with his medications.  Patient  will put an "x" beside the date where he is writing down his blood glucose on the days that he takes his Levemir.     [facility] CM Care Plan Problem Two   Flowsheet Row Most Recent Value  Care Plan Problem Two  Tobacco Cessation  Role Documenting the Problem Two  Clinical Pharmacist  Care Plan for Problem Two  Active  THN CM Short Term Goal #1 (0-30 days)  Patient will decrease cigarrette use to 3-4 per day over the next 14 days as evidenced by patient report  THN CM Short Term Goal #1 Start Date  11/30/15  Interventions for Short Term Goal #2   Counseled on cardiovascular, lung and cancer risks of continuing to smoke.  Utilized motivational interviewing to set cigarrette use decrease goal.

## 2015-12-02 ENCOUNTER — Ambulatory Visit: Payer: Medicare Other | Attending: Internal Medicine | Admitting: Physical Therapy

## 2015-12-02 ENCOUNTER — Other Ambulatory Visit: Payer: Self-pay | Admitting: Internal Medicine

## 2015-12-02 DIAGNOSIS — M25562 Pain in left knee: Secondary | ICD-10-CM | POA: Insufficient documentation

## 2015-12-02 DIAGNOSIS — M6281 Muscle weakness (generalized): Secondary | ICD-10-CM | POA: Insufficient documentation

## 2015-12-02 DIAGNOSIS — M25561 Pain in right knee: Secondary | ICD-10-CM | POA: Insufficient documentation

## 2015-12-02 DIAGNOSIS — R262 Difficulty in walking, not elsewhere classified: Secondary | ICD-10-CM | POA: Insufficient documentation

## 2015-12-02 DIAGNOSIS — G8929 Other chronic pain: Secondary | ICD-10-CM | POA: Diagnosis not present

## 2015-12-02 NOTE — Therapy (Signed)
[place], [place], [address] Phone: [phone]   Fax:  [phone]  Physical Therapy Evaluation  Patient Details  Name: Michael [name] MRN: [id number] Date of Birth: 1955-04-09 Referring Provider: Vernelle Emerald, MD  Encounter Date: 12/02/2015      PT End of Session - 12/02/15 1358    Visit Number 1   Number of Visits 16   Date for PT Re-Evaluation 02/01/16   Authorization Type kx modifier 15 visit   PT Start Time 1345   PT Stop Time 1425   PT Time Calculation (min) 40 min   Activity Tolerance Patient tolerated treatment well   Behavior During Therapy [facility] for tasks assessed/performed      Past Medical History:  Diagnosis Date  . Diabetes mellitus without complication ([place])   . Pancreatitis   . Seizures ([place])     Past Surgical History:  Procedure Laterality Date  . BALLOON DILATION  03/22/2011   Procedure: BALLOON DILATION;  Surgeon: Gatha Mayer, MD;  Location: [facility];  Service: Endoscopy;  Laterality: N/A;  . COLONOSCOPY N/A 05/22/2012   Procedure: COLONOSCOPY;  Surgeon: Gatha Mayer, MD;  Location: [place];  Service: Endoscopy;  Laterality: N/A;  . COLONOSCOPY W/ BIOPSIES AND POLYPECTOMY  09/12/11  . ESOPHAGOGASTRODUODENOSCOPY  03/22/2011   Procedure: ESOPHAGOGASTRODUODENOSCOPY (EGD);  Surgeon: Gatha Mayer, MD;  Location: [facility];  Service: Endoscopy;  Laterality: N/A;  egd with balloon   . EUS  04/20/2011   Procedure: UPPER ENDOSCOPIC ULTRASOUND (EUS) LINEAR;  Surgeon: Milus Banister, MD;  Location: [facility];  Service: Endoscopy;  Laterality: N/A;    There were no vitals filed for this visit.       Subjective Assessment - 12/02/15 1348    Subjective Pt arriving to therapy reporting bilateral knee pain. Pt rporting pain of 6/10 bilaterally. Pt is being seen at pain clinic. Pt is diabetic and reporting seizures beginning in February and March and one more  recently last month. Pt reporting his seizures are related to diabetes and sugar levels. Pt had a fall in February when he had the seizure.    Pertinent History seizures   How long can you sit comfortably? 15 minutes   How long can you stand comfortably? 30 minutes   How long can you walk comfortably? 30 minutes   Diagnostic tests MRI a few months ago per pt report   Patient Stated Goals be able to walk without pain   Currently in Pain? Yes   Pain Score 6    Pain Location Knee   Pain Orientation Right;Left   Pain Descriptors / Indicators Aching   Pain Type Chronic pain   Pain Onset More than a month ago   Pain Frequency Constant   Aggravating Factors  bending, lifting, walking long periods   Pain Relieving Factors pain meds,    Effect of Pain on Daily Activities it's painful to walk, sitting long periods, trouble sleeping   Multiple Pain Sites No            [facility] PT Assessment - 12/02/15 0001      Assessment   Medical Diagnosis bilateral knee pain   Referring Provider Vernelle Emerald, MD   Onset Date/Surgical Date 02/01/15   Hand Dominance Right   Prior Therapy none     Precautions   Precautions None     Restrictions   Weight Bearing Restrictions No     Balance Screen   Has  the patient fallen in the past 6 months Yes   How many times? 3   Has the patient had a decrease in activity level because of a fear of falling?  Yes   Is the patient reluctant to leave their home because of a fear of falling?  No     Home Environment   Living Environment Private residence   Living Arrangements Other relatives   Available Help at Discharge Family   Type of [place] to enter   Entrance Stairs-Number of Steps 1   [place] Two level;Able to live on main level with bedroom/bathroom   Alternate Level Stairs-Number of Steps --  pt reports not having to go up the stairs   Home Equipment None     Prior Function   Level of Independence Independent    Vocation Unemployed   Leisure walk, play cards, cook     Cognition   Overall Cognitive Status Within Functional Limits for tasks assessed     Observation/Other Assessments   Focus on Therapeutic Outcomes (FOTO)  32% limitation     ROM / Strength   AROM / PROM / Strength AROM;Strength     AROM   AROM Assessment Site Knee   Right/Left Knee Right;Left   Right Knee Extension 0   Right Knee Flexion 120   Left Knee Extension 0   Left Knee Flexion 115     Strength   Strength Assessment Site Knee   Right/Left Knee Right;Left   Right Knee Flexion 4-/5   Right Knee Extension 4-/5   Left Knee Flexion 4-/5   Left Knee Extension 4-/5     Palpation   Patella mobility intact     Transfers   Transfers Sit to Stand   Five time sit to stand comments  26 seconds     Ambulation/Gait   Ambulation/Gait Yes   Ambulation/Gait Assistance 7: Independent   Ambulation Distance (Feet) 40 Feet   Assistive device None   Gait Pattern Step-through pattern;Wide base of support  lateral sway bilateral sides   Ambulation Surface Level;Indoor     Balance   Balance Assessed Yes     Standardized Balance Assessment   Standardized Balance Assessment Timed Up and Go Test     Timed Up and Go Test   TUG Normal TUG   Normal TUG (seconds) 15     High Level Balance   High Level Balance Comments right SLS= 3 seconds, left SLS= 2 seconds                             PT Short Term Goals - 12/02/15 1441      PT SHORT TERM GOAL #1   Title Pt will be independent in his HEP   Time 3   Period Weeks   Status New           PT Long Term Goals - 12/02/15 1442      PT LONG TERM GOAL #1   Title Pt will improve bilateral knee flexion/extension strength to 5/5 in order to improve functional mobility an gait.    Baseline grossly -4/5   Time 6   Period Weeks   Status New     PT LONG TERM GOAL #2   Title pt will be able to amb 30 minutes with pain less than 3/10 in bilateral  knees.    Baseline 6/10 at rest  Time 6   Period Weeks   Status New     PT LONG TERM GOAL #3   Title pt will improve his FOTO score from 32% limitation to 25% limitation   Time 6   Period Weeks   Status New               Plan - 12/22/2015 1435    Clinical Impression Statement pt is a 60 year old man complaining of arthritic bilateral knee pain of 6/10 at rest. Pt reporting pain began in january 2017 after experiencing a fall secondary to diabetic seizure. Pt also reporting at least 3 other falls since January 2017. Pt presenting with decreased strength, increased pain, and decreased balance which effects pt's overall functional mobility and gait.    Rehab Potential Good   PT Frequency 2x / week   PT Duration 6 weeks   PT Treatment/Interventions ADLs/Self Care Home Management;Cryotherapy;Electrical Stimulation;Iontophoresis 4mg /ml Dexamethasone;Functional mobility training;Stair training;Gait training;Moist Heat;Therapeutic activities;Therapeutic exercise;Balance training;Neuromuscular re-education;Patient/family education;Passive range of motion;Manual techniques;Vasopneumatic Device   PT Next Visit Plan begin Nustep/Bike, bilateral knee strengthening exercises   PT Home Exercise Plan SLR, Hip abd in sidelying, mini-squats   Consulted and Agree with Plan of Care Patient      Patient will benefit from skilled therapeutic intervention in order to improve the following deficits and impairments:  Decreased mobility, Decreased strength, Decreased balance, Decreased activity tolerance, Pain, Abnormal gait, Difficulty walking  Visit Diagnosis: Chronic pain of left knee  Chronic pain of right knee  Muscle weakness (generalized)  Difficulty in walking, not elsewhere classified      G-Codes - 12/22/2015 1449    Functional Assessment Tool Used FOTO   Functional Limitation Mobility: Walking and moving around   Mobility: Walking and Moving Around Current Status [phone]) At least 20  percent but less than 40 percent impaired, limited or restricted   Mobility: Walking and Moving Around Goal Status [phone]) At least 20 percent but less than 40 percent impaired, limited or restricted       Problem List Patient Active Problem List   Diagnosis Date Noted  . Peripheral vascular disease ([place]) 11/15/2015  . Alcohol abuse 09/02/2015  . Tobacco abuse 09/02/2015  . Tricompartment osteoarthritis of both knees 06/15/2015  . Chronic abdominal pain 03/14/2015  . Vitamin D deficiency 03/12/2015  . Hyperlipidemia 02/17/2015  . Fall 12/24/2014  . History of alcohol abuse 12/05/2014  . Protein-calorie malnutrition, severe 11/18/2014  . Cervical arthritis ([place]) 01/01/2014  . Chronic pancreatitis ([place]) 02/24/2013  . Health care maintenance 08/25/2011  . Chronic alcoholic pancreatitis ([place]) 04/20/2011  . Type 2 diabetes mellitus with peripheral neuropathy ([place]) 12/09/2010  . HTN (hypertension) 12/09/2010    Oretha Caprice, MPT  2015/12/22, 2:53 PM  [facility] [address] [place], [place], [address] Phone: [phone]   Fax:  [phone]  Name: Michael [name] MRN: [id number] Date of Birth: [birth year]

## 2015-12-05 ENCOUNTER — Other Ambulatory Visit: Payer: Self-pay | Admitting: Internal Medicine

## 2015-12-05 MED ORDER — IBUPROFEN 800 MG PO TABS: 800.0000 mg | ORAL_TABLET | Freq: Three times a day (TID) | ORAL | 0 refills | Status: DC | PRN

## 2015-12-06 ENCOUNTER — Telehealth: Payer: Self-pay | Admitting: Internal Medicine

## 2015-12-06 NOTE — Telephone Encounter (Signed)
Called [facility], pharmacist states he is reporting pt for fraud, pt has presented to pharm, yelling, cursing and demanding that he be given test strips, pharm states pt has been given appr 3000 test strips this year and pt just recently was given some now pt has told pharmacist that he cant ask him any questions he should just give him what he wants, so pharmacist has called insurance company and now states he will report pt for fraud. Informed him thank you for the information

## 2015-12-06 NOTE — Addendum Note (Signed)
Addended by: Hulan Fray on: 12/06/2015 07:59 PM   Modules accepted: Orders

## 2015-12-06 NOTE — Telephone Encounter (Signed)
Pharmacy calling with a concern with patient would like to talk to nurse

## 2015-12-08 ENCOUNTER — Ambulatory Visit: Payer: Medicare Other | Admitting: Physical Therapy

## 2015-12-09 ENCOUNTER — Telehealth: Payer: Self-pay | Admitting: Licensed Clinical Social Worker

## 2015-12-09 ENCOUNTER — Ambulatory Visit: Payer: Medicare Other | Admitting: Physical Therapy

## 2015-12-09 NOTE — Telephone Encounter (Signed)
Spoke w/ pt he was speaking very rapidly and "jumbled", very difficult to slow him down. He insists that he is to take meloxicam for the rest of his life. At one point he ask for test strips and was told his insurance is refusing to pay for them per the pharm, he wanted them sent another pharm so they would fill them but was informed that they would still be sent to insurance for approval. He states "ok, ok, ok," and that he will talk to the dr about it, informed he will need to stay focused only on meloxicam. appt set for Monday due to transportation

## 2015-12-09 NOTE — Telephone Encounter (Signed)
Pt needs to speak with a nurse. Please call back.  

## 2015-12-09 NOTE — Telephone Encounter (Signed)
Mr. Loduca placed call direct to CSW.  Pt states he attempted main number but then contact CSW.  Pt requesting refill of M(??) and that his appointment with PCP is too far away.  This worker could not make out what patient was saying.  Mr. Marcello states the order number is [id number] and he is in need of this medication.  Pt's speech was rapid and pressured.  Mr. Hoeksema states the reason for his rapid/pressured speech "this is what my sugar does to me."  Pt aware this worker will forward note to triage RN.

## 2015-12-13 ENCOUNTER — Encounter: Payer: Self-pay | Admitting: Internal Medicine

## 2015-12-13 ENCOUNTER — Ambulatory Visit: Payer: Medicare Other

## 2015-12-13 ENCOUNTER — Telehealth: Payer: Self-pay | Admitting: Internal Medicine

## 2015-12-13 NOTE — Telephone Encounter (Signed)
APT. REMINDER CALL, LMTCB °

## 2015-12-14 ENCOUNTER — Ambulatory Visit: Payer: Medicare Other

## 2015-12-14 ENCOUNTER — Ambulatory Visit: Payer: Medicare Other | Admitting: Physical Therapy

## 2015-12-14 ENCOUNTER — Encounter: Payer: Self-pay | Admitting: Internal Medicine

## 2015-12-15 ENCOUNTER — Other Ambulatory Visit: Payer: Self-pay | Admitting: Pharmacist

## 2015-12-15 NOTE — Patient Outreach (Signed)
[facility]) Care Management  12/15/2015  MIKHAI HANSER 01-27-1955 [id number]  60 year old male previously seen by me for medication adherence with assistance filling his pill box.  Arrived at patients home for [place] home visit today but patients family states he is not home and is at his girlfriends house. Patients family report patient has been taking his medications as far as they know.  Plan: Will plan home visit for next week to assess medication adherence and fill pill box.  [name], PharmD [facility] PGY2 Pharmacy Resident [phone]

## 2015-12-21 ENCOUNTER — Ambulatory Visit: Payer: Medicare Other | Admitting: Physical Therapy

## 2015-12-21 DIAGNOSIS — G8929 Other chronic pain: Secondary | ICD-10-CM | POA: Diagnosis not present

## 2015-12-21 DIAGNOSIS — R262 Difficulty in walking, not elsewhere classified: Secondary | ICD-10-CM | POA: Diagnosis not present

## 2015-12-21 DIAGNOSIS — M25562 Pain in left knee: Secondary | ICD-10-CM | POA: Diagnosis not present

## 2015-12-21 DIAGNOSIS — M25561 Pain in right knee: Secondary | ICD-10-CM

## 2015-12-21 DIAGNOSIS — M6281 Muscle weakness (generalized): Secondary | ICD-10-CM | POA: Diagnosis not present

## 2015-12-21 NOTE — Therapy (Signed)
[place], [place], [address] Phone: [phone]   Fax:  [phone]  Physical Therapy Treatment  Patient Details  Name: Michael [name] MRN: [id number] Date of Birth: 05-Jul-1955 Referring Provider: Vernelle Emerald, MD  Encounter Date: 12/21/2015      PT End of Session - 12/21/15 1516    Visit Number 2   Number of Visits 16   Date for PT Re-Evaluation 02/01/16   PT Start Time [id number]   PT Stop Time 1506   PT Time Calculation (min) 49 min   Activity Tolerance Patient tolerated treatment well   Behavior During Therapy [facility] for tasks assessed/performed      Past Medical History:  Diagnosis Date  . Diabetes mellitus without complication ([place])   . Pancreatitis   . Seizures ([place])     Past Surgical History:  Procedure Laterality Date  . BALLOON DILATION  03/22/2011   Procedure: BALLOON DILATION;  Surgeon: Gatha Mayer, MD;  [facility];  Service: Endoscopy;  Laterality: N/A;  . COLONOSCOPY N/A 05/22/2012   Procedure: COLONOSCOPY;  Surgeon: Gatha Mayer, MD;  Location: [place];  Service: Endoscopy;  Laterality: N/A;  . COLONOSCOPY W/ BIOPSIES AND POLYPECTOMY  09/12/11  . ESOPHAGOGASTRODUODENOSCOPY  03/22/2011   Procedure: ESOPHAGOGASTRODUODENOSCOPY (EGD);  Surgeon: Gatha Mayer, MD;  [facility];  Service: Endoscopy;  Laterality: N/A;  egd with balloon   . EUS  04/20/2011   Procedure: UPPER ENDOSCOPIC ULTRASOUND (EUS) LINEAR;  Surgeon: Milus Banister, MD;  [facility];  Service: Endoscopy;  Laterality: N/A;    There were no vitals filed for this visit.      Subjective Assessment - 12/21/15 1429    Subjective I have been doing my exercises.  Knee pain 5/10.   Currently in Pain? Yes   Pain Score 5    Pain Location Knee   Pain Orientation Left;Right   Pain Descriptors / Indicators --  knees pop   Pain Type Chronic pain   Pain Onset More than a month ago    Pain Frequency Constant   Aggravating Factors  cold weather,  walking longer   Pain Relieving Factors medication   Effect of Pain on Daily Activities frequent falls.    Multiple Pain Sites --  has pain and numbness in both arms intermittantly, better with change of position                         [facility] Adult PT Treatment/Exercise - 12/21/15 0001      Ambulation/Gait   Ambulation/Gait Yes   Assistive device --  front wheeled walker, SPC   Gait Pattern --  step through with instruction.   Ambulation Surface Level;Indoor   Gait Comments cues for sit to stand, how to collapse walker.     Knee/Hip Exercises: Supine   Heel Prop for Knee Extension Weight (lbs) --  both, minor cues   Straight Leg Raises 1 set;10 reps     Knee/Hip Exercises: Sidelying   Hip ABduction 1 set;10 reps   Hip ABduction Limitations Each, good form                PT Education - 12/21/15 1516    Education provided Yes   Education Details Gait with walker   Person(s) Educated Patient   Methods Explanation;Demonstration;Verbal cues   Comprehension Verbalized understanding;Returned demonstration          PT Short Term  Goals - 12/21/15 1521      PT SHORT TERM GOAL #1   Title Pt will be independent in his HEP   Baseline minor cues   Time 3   Period Weeks   Status On-going           PT Long Term Goals - 12/02/15 1442      PT LONG TERM GOAL #1   Title Pt will improve bilateral knee flexion/extension strength to 5/5 in order to improve functional mobility an gait.    Baseline grossly -4/5   Time 6   Period Weeks   Status New     PT LONG TERM GOAL #2   Title pt will be able to amb 30 minutes with pain less than 3/10 in bilateral knees.    Baseline 6/10 at rest   Time 6   Period Weeks   Status New     PT LONG TERM GOAL #3   Title pt will improve his FOTO score from 32% limitation to 25% limitation   Time 6   Period Weeks   Status New                Plan - 12/21/15 1517    Clinical Impression Statement Patient has fallen 2 X since last visit.  He gets "Nervous" and falls.  Most of session spent with gait training .  A loaner walker given to use until Order for walker is returned.  No increased pain at end of session,  he had less knee pain with use of walker.  He tried a [facility], however he was unable to use safely.    PT Next Visit Plan begin Nustep/Bike, bilateral knee strengthening exercises.  Check for walker order and issue if able.    PT Home Exercise Plan SLR, Hip abd in sidelying, mini-squats   Consulted and Agree with Plan of Care Patient      Patient will benefit from skilled therapeutic intervention in order to improve the following deficits and impairments:  Decreased mobility, Decreased strength, Decreased balance, Decreased activity tolerance, Pain, Abnormal gait, Difficulty walking  Visit Diagnosis: Chronic pain of left knee  Chronic pain of right knee  Muscle weakness (generalized)  Difficulty in walking, not elsewhere classified     Problem List Patient Active Problem List   Diagnosis Date Noted  . Peripheral vascular disease ([place]) 11/15/2015  . Alcohol abuse 09/02/2015  . Tobacco abuse 09/02/2015  . Tricompartment osteoarthritis of both knees 06/15/2015  . Chronic abdominal pain 03/14/2015  . Vitamin D deficiency 03/12/2015  . Hyperlipidemia 02/17/2015  . Fall 12/24/2014  . History of alcohol abuse 12/05/2014  . Protein-calorie malnutrition, severe 11/18/2014  . Cervical arthritis ([place]) 01/01/2014  . Chronic pancreatitis ([place]) 02/24/2013  . Health care maintenance 08/25/2011  . Chronic alcoholic pancreatitis ([place]) 04/20/2011  . Type 2 diabetes mellitus with peripheral neuropathy ([place]) 12/09/2010  . HTN (hypertension) 12/09/2010    Imajean Mcdermid PTA 12/21/2015, 3:23 PM  [facility] [address] [place], [place], [address] Phone: [phone]   Fax:   [phone]  Name: Michael [name] MRN: [id number] Date of Birth: [birth year]

## 2015-12-22 ENCOUNTER — Other Ambulatory Visit: Payer: Self-pay | Admitting: Pharmacist

## 2015-12-22 ENCOUNTER — Encounter: Payer: Self-pay | Admitting: Pharmacist

## 2015-12-22 NOTE — Patient Outreach (Addendum)
[facility]) Care Management  [place]   12/22/2015  Michael [name] 1955/11/27 [id number]  Subjective: 60 year old male previously seen by me for medication adherence with assistance filling his pill box. Reports adherence with his medications.  His pill box is empty today but has been more than 14 days since last filled pill box. He denies missing any doses of his insulin.  He states he threw away his amitryptyline because he has been sleeping all the time and having trouble waking up.   Smoking 5 cigarretes per day currently.  Interested in trying nicotine patches.   Reports 2 falls which he felt like he was shaking and had dizziness. Denies sweating.  States it took him 20 minutes to get up. Did not check his blood glucose.   Patient denies hypoglycemic events.  Patient reports nocturia 3 times per night.  Patient denies pain/burning upon urination.  Patient reports neuropathy. Patient denies visual changes. Patient reports self foot exams. Denies changes  Patient reported self monitored blood glucose frequency 3 times per day (patient reported) Home fasting CBG: 213 mg/dL today (patient reports, does not have meter with him today)   Objective:  Lab Results  Component Value Date   HGBA1C 10.6 10/15/2015   Vitals:   12/22/15 1456  BP: 120/89  Pulse: 100    Lipid Panel     Component Value Date/Time   CHOL 157 01/19/2014 1338   TRIG 154 (H) 01/19/2014 1338   HDL 48 01/19/2014 1338   CHOLHDL 3.3 01/19/2014 1338   VLDL 31 01/19/2014 1338   LDLCALC 78 01/19/2014 1338   LDLDIRECT 89 06/14/2012 1521      Encounter Medications: Outpatient Encounter Prescriptions as of 12/22/2015  Medication Sig Note  . ACCU-CHEK FASTCLIX LANCETS MISC Use to check your blood sugar 3 times a day   . Blood Glucose Monitoring Suppl (ACCU-CHEK AVIVA PLUS) w/Device KIT Check blood sugar up to 3 times a day   . CREON 36000 units CPEP capsule TAKE 1 CAPSULE BY MOUTH  3  TIMES DAILY BEFORE MEALS   . diclofenac sodium (VOLTAREN) 1 % GEL Apply 4 g topically 4 (four) times daily.   Marland Kitchen gabapentin (NEURONTIN) 300 MG capsule TAKE 3 CAPSULES BY MOUTH  EVERY MORNING AND  AFTERNOON. TAKE 4 CAPSULES  BY MOUTH AT NIGHT BEFORE  BED   . glucose blood (ACCU-CHEK AVIVA PLUS) test strip The patient is insulin requiring, ICD 10 code E11.42. The patient tests 4 times per day.   . Insulin Detemir (LEVEMIR FLEXTOUCH) 100 UNIT/ML Pen Inject 32 Units into the skin at bedtime.   . Insulin Pen Needle 31G X 5 MM MISC Use to inject 15 units of Levemir one time a day   . Lancet Devices (ACCU-CHEK SOFTCLIX) lancets Use to check your blood sugar 3 times a day. Dx code E11.65. Insulin dependent.   Marland Kitchen loperamide (IMODIUM) 2 MG capsule TAKE 1 CAPSULE BY MOUTH AS  NEEDED FOR DIARRHEA OR  LOOSE STOOLS. NO MORE THAN  8 CAPSULES IN 1 DAY.   Marland Kitchen lovastatin (MEVACOR) 40 MG tablet TAKE 1 TABLET BY MOUTH AT  BEDTIME   . pantoprazole (PROTONIX) 40 MG tablet TAKE 1 TABLET BY MOUTH  DAILY   . Vitamin D, Ergocalciferol, (DRISDOL) 50000 units CAPS capsule Take 1 capsule (50,000 Units total) by mouth every 7 (seven) days.   . [DISCONTINUED] ibuprofen (ADVIL,MOTRIN) 800 MG tablet Take 1 tablet (800 mg total) by mouth every 8 (eight)  hours as needed for moderate pain. 12/22/2015: Using daily or every other day   . amitriptyline (ELAVIL) 50 MG tablet Take 150 mg by mouth at bedtime.   . folic acid (FOLVITE) 1 MG tablet Take 1 tablet (1 mg total) by mouth daily. (Patient not taking: Reported on 12/22/2015)   . magnesium oxide (MAG-OX) 400 (241.3 Mg) MG tablet Take 1 tablet (400 mg total) by mouth daily. (Patient not taking: Reported on 12/22/2015)    No facility-administered encounter medications on file as of 12/22/2015.     Functional Status: In your present state of health, do you have any difficulty performing the following activities: 11/30/2015 11/12/2015  Hearing? - N  Vision? - N  Difficulty  concentrating or making decisions? - Y  Walking or climbing stairs? - N  Dressing or bathing? - N  Doing errands, shopping? - N  Conservation officer, nature and eating ? N -  Using the Toilet? N -  In the past six months, have you accidently leaked urine? N -  Do you have problems with loss of bowel control? - -  Managing your Medications? Y -  Managing your Finances? N -  Housekeeping or managing your Housekeeping? N -  Some recent data might be hidden    Fall/Depression Screening: PHQ 2/9 Scores 11/30/2015 10/15/2015 09/17/2015 08/02/2015 07/05/2015 03/12/2015 01/28/2015  PHQ - 2 Score 1 0 0 0 0 0 0  PHQ- 9 Score - - - - - - -    Assessment: Medication Adherence: Patient reports adherence to his medications but and his pill box was empty during visit today but it has been >14 days since last pill box fill (pill box filled for 14 days). Patient reports adherence with his insulin but does not have his meter with him today and states it is at his girlfriends house.   Plan: Filled 2 week supply of oral medications in patients pill box Instructed patient that he would need to have his blood glucose meter at next visit Counseled on importance of adherence to his medications Patient refuses to take his amitripytline and this was not placed in his pill box.    [name], PharmD [facility] PGY2 Pharmacy Resident [phone]  [facility] CM Care Plan Problem One   Flowsheet Row Most Recent Value  Care Plan Problem One  Non Adherence to Medications as evidenced by pill box at home visits  Role Documenting the Problem One  [place] for Problem One  Active  [facility] CM Short Term Goal #1 (0-30 days)  Patient will utilize his pill box 90% of the time as evidenced by pharmacist review of remaining medications in pill box over the next 2 weeks  [facility] CM Short Term Goal #1 Start Date  11/30/15  Interventions for Short Term Goal #1  Pharmacist will fill pill boxes and will teach patient how to fill his own  pill box.  Education provided on need for proper adherence and proper storage of medications.  Counseled on importance of adherence to his regimen and taking his medications as prescribed to prevent furture ED visits and hospitalizations.  plan to teach patient how to fill his pill box at the next visit once patient shows that he can be adherent to his regimen. Discussed adherence with patients sister and she plans to help him with his medications.  Patient will put an "x" beside the date where he is writing down his blood glucose on the days that he takes his Levemir.  [facility] CM Care Plan Problem Two   Flowsheet Row Most Recent Value  Care Plan Problem Two  Tobacco Cessation  Role Documenting the Problem Two  Clinical Pharmacist  Care Plan for Problem Two  Active  [facility] CM Short Term Goal #1 (0-30 days)  Patient will decrease cigarrette use to 3-4 per day over the next 14 days as evidenced by patient report  [facility] CM Short Term Goal #1 Start Date  11/30/15  Interventions for Short Term Goal #2   Counseled on cardiovascular, lung and cancer risks of continuing to smoke.  Utilized motivational interviewing to set cigarrette use decrease goal.

## 2015-12-23 ENCOUNTER — Ambulatory Visit: Payer: Medicare Other | Admitting: Physical Therapy

## 2015-12-28 ENCOUNTER — Ambulatory Visit: Payer: Medicare Other | Attending: Internal Medicine | Admitting: Physical Therapy

## 2015-12-28 DIAGNOSIS — G8929 Other chronic pain: Secondary | ICD-10-CM | POA: Insufficient documentation

## 2015-12-28 DIAGNOSIS — M25562 Pain in left knee: Secondary | ICD-10-CM | POA: Insufficient documentation

## 2015-12-28 DIAGNOSIS — R262 Difficulty in walking, not elsewhere classified: Secondary | ICD-10-CM | POA: Insufficient documentation

## 2015-12-28 DIAGNOSIS — M25561 Pain in right knee: Secondary | ICD-10-CM | POA: Insufficient documentation

## 2015-12-28 DIAGNOSIS — M6281 Muscle weakness (generalized): Secondary | ICD-10-CM | POA: Insufficient documentation

## 2015-12-30 ENCOUNTER — Ambulatory Visit: Payer: Medicare Other | Admitting: Physical Therapy

## 2016-01-04 ENCOUNTER — Ambulatory Visit: Payer: Medicare Other | Admitting: Physical Therapy

## 2016-01-05 ENCOUNTER — Other Ambulatory Visit: Payer: Self-pay | Admitting: Pharmacist

## 2016-01-06 ENCOUNTER — Telehealth: Payer: Self-pay | Admitting: Internal Medicine

## 2016-01-06 NOTE — Telephone Encounter (Signed)
   Reason for call:   I received a call from [name] at 1800 PM indicating he has an appointment with Dr. Benjamine Mola on 12/22 and received a letter with several prescriptions he would like to have filled. Informs me that he will bring the letter with him to the visit. Would like for me to contact Dr. Benjamine Mola to let him know.  Also, requesting to have his Ibuprofen prescription sent into to his [facility]   Pertinent Data:   Patient received letter with requests for prescriptions   Assessment / Plan / Recommendations:   Will pass message along to Dr. Benjamine Mola. Requested a phone call from Dr. Benjamine Mola. Would like to be called at [phone].  As always, pt is advised that if symptoms worsen or new symptoms arise, they should go to an urgent care facility or to to ER for further evaluation.   Maryellen Pile, MD   01/06/2016, 6:08 PM

## 2016-01-07 ENCOUNTER — Telehealth: Payer: Self-pay | Admitting: Internal Medicine

## 2016-01-07 ENCOUNTER — Other Ambulatory Visit: Payer: Self-pay

## 2016-01-07 ENCOUNTER — Telehealth: Payer: Self-pay

## 2016-01-07 DIAGNOSIS — M47812 Spondylosis without myelopathy or radiculopathy, cervical region: Secondary | ICD-10-CM

## 2016-01-07 MED ORDER — IBUPROFEN 800 MG PO TABS: 800.0000 mg | ORAL_TABLET | Freq: Three times a day (TID) | ORAL | 0 refills | Status: DC | PRN

## 2016-01-07 NOTE — Telephone Encounter (Signed)
Needs to speak with a nurse regarding personal problem.  

## 2016-01-07 NOTE — Telephone Encounter (Signed)
  ibuprofen (ADVIL,MOTRIN) 800 MG tablet, refill request @ [facility] on [address]

## 2016-01-07 NOTE — Telephone Encounter (Signed)
Called pt - stated he needs to talk to Dr Benjamine Mola. He would not tell me what he needed. Repeated he needs to talk to Dr Benjamine Mola and no one else. Told him I will send message to him. Telephone # [phone].

## 2016-01-07 NOTE — Telephone Encounter (Signed)
   Reason for call:   I received a call from Mr. [name] at 1113 PM indicating he needed a refill of ibuprofen.   Pertinent Data:   He had called in earlier today requesting to speak with his PCP, but he was unable to do so.  He is scheduled to follow-up on 12/22 with PCP.  He is almost out of ibuprofen and would like a refill for his arthritis.  Per review of his chart, this medication was discontinued on 11/15/15 by Dr. Marlowe Sax for GI upset. He tells me today he does not have symptoms of dyspepsia, diarrhea, nausea, vomiting.   Assessment / Plan / Recommendations:   I will prescribe ibuprofen 800 mg x 15 tablets for a short course until he can discuss with PCP.  As always, pt is advised that if symptoms worsen or new symptoms arise, they should go to an urgent care facility or to to ER for further evaluation.   Riccardo Dubin, MD   01/07/2016, 11:26 PM

## 2016-01-10 NOTE — Telephone Encounter (Signed)
Spoke briefly with Michael [name] around 4pm 12/15 and he reported needing paperwork filled out for his pharmacy. He did not make any other specific request or clinical complaint over the phone at this time and acknowledged having an appointment on 12/22 with the plan to bring this paperwork for completion at that visit.  No changes were made to existing prescriptions or plan.

## 2016-01-11 ENCOUNTER — Encounter: Payer: Self-pay | Admitting: Physical Therapy

## 2016-01-11 ENCOUNTER — Ambulatory Visit: Payer: Medicare Other | Admitting: Physical Therapy

## 2016-01-11 ENCOUNTER — Other Ambulatory Visit: Payer: Self-pay | Admitting: Pharmacist

## 2016-01-11 DIAGNOSIS — M25562 Pain in left knee: Principal | ICD-10-CM

## 2016-01-11 DIAGNOSIS — G8929 Other chronic pain: Secondary | ICD-10-CM | POA: Diagnosis not present

## 2016-01-11 DIAGNOSIS — R262 Difficulty in walking, not elsewhere classified: Secondary | ICD-10-CM | POA: Diagnosis not present

## 2016-01-11 DIAGNOSIS — M25561 Pain in right knee: Secondary | ICD-10-CM

## 2016-01-11 DIAGNOSIS — M6281 Muscle weakness (generalized): Secondary | ICD-10-CM | POA: Diagnosis not present

## 2016-01-11 NOTE — Patient Outreach (Signed)
[facility]) Care Management  01/11/2016  Michael [name] 05-Sep-1955 [id number]  60 year old male referred to [place] for medication adherence.  Called patient today to reschedule pharmacy home visit after he no showed home visit last week.  Was unable to reach patient via telephone and left HIPAA compliant message.  This is unsuccessful outreach attempt #2.  Plan: Will followup next week via telephone to reschedule home visit.  If patient is unable to be reached for 3rd outreach attempt then patient may be closed out by [place].  [name], PharmD [facility] PGY2 Pharmacy Resident [phone]

## 2016-01-11 NOTE — Patient Outreach (Signed)
[place] [facility]) Care Management  [place]   01/11/2016  Michael [name] [birth year] [id number]   60 year old male previously seen by me for medication adherence with assistance filling his pill box.  Today I arrived at his home for pharmacy home visit but patient was not home (unsuccessful outreach #1).   Plan: Will followup via telephone next week to reschedule home visit  [name], PharmD [facility] PGY2 Pharmacy Resident [phone]

## 2016-01-11 NOTE — Therapy (Signed)
[place], [place], [address] Phone: [phone]   Fax:  [phone]  Physical Therapy Treatment  Patient Details  Name: Michael [name] MRN: [id number] Date of Birth: 15-Jul-1955 Referring Provider: Vernelle Emerald, MD  Encounter Date: 01/11/2016      PT End of Session - 01/11/16 1037    Visit Number 3   Number of Visits 16   Date for PT Re-Evaluation 02/01/16   Authorization Type kx modifier 15 visit   PT Start Time 1020   PT Stop Time 1100   PT Time Calculation (min) 40 min   Activity Tolerance Patient tolerated treatment well   Behavior During Therapy [facility] for tasks assessed/performed      Past Medical History:  Diagnosis Date  . Diabetes mellitus without complication ([place])   . Pancreatitis   . Seizures ([place])     Past Surgical History:  Procedure Laterality Date  . BALLOON DILATION  03/22/2011   Procedure: BALLOON DILATION;  Surgeon: Gatha Mayer, MD;  [facility];  Service: Endoscopy;  Laterality: N/A;  . COLONOSCOPY N/A 05/22/2012   Procedure: COLONOSCOPY;  Surgeon: Gatha Mayer, MD;  Location: [place];  Service: Endoscopy;  Laterality: N/A;  . COLONOSCOPY W/ BIOPSIES AND POLYPECTOMY  09/12/11  . ESOPHAGOGASTRODUODENOSCOPY  03/22/2011   Procedure: ESOPHAGOGASTRODUODENOSCOPY (EGD);  Surgeon: Gatha Mayer, MD;  [facility];  Service: Endoscopy;  Laterality: N/A;  egd with balloon   . EUS  04/20/2011   Procedure: UPPER ENDOSCOPIC ULTRASOUND (EUS) LINEAR;  Surgeon: Milus Banister, MD;  [facility];  Service: Endoscopy;  Laterality: N/A;    There were no vitals filed for this visit.      Subjective Assessment - 01/11/16 1034    Subjective Pt reporting 6-7/10 pain in bilateral LE's. Pt reporting more tightness due to neuropathy per pt report.    Pertinent History seizures   How long can you sit comfortably? 15 minutes   How long can you stand  comfortably? 30 minutes   How long can you walk comfortably? 30 minutes   Diagnostic tests MRI a few months ago per pt report   Patient Stated Goals be able to walk without pain   Currently in Pain? Yes   Pain Score 7    Pain Location Knee   Pain Orientation Right;Left   Pain Descriptors / Indicators Aching;Burning   Pain Type Chronic pain;Neuropathic pain   Pain Onset More than a month ago   Pain Frequency Constant   Aggravating Factors  cold, weather, walking longer distances   Pain Relieving Factors medication   Effect of Pain on Daily Activities Frequent falls   Multiple Pain Sites No                         [facility] Adult PT Treatment/Exercise - 01/11/16 0001      Ambulation/Gait   Ambulation/Gait Yes   Ambulation Distance (Feet) 100 Feet   Assistive device Rolling walker   Gait Pattern Step-through pattern   Ambulation Surface Level;Indoor   Gait Comments sit to stand without UE support. x 10     Exercises   Exercises Knee/Hip     Knee/Hip Exercises: Stretches   Other Knee/Hip Stretches seated hamstromg stretch x 3 each LE holding 20 seconds     Knee/Hip Exercises: Aerobic   Nustep 6 minutes     Knee/Hip Exercises: Standing   Heel Raises 15 reps  UE support   Other Standing Knee Exercises standing marching with UE on RW 15 times each LE     Knee/Hip Exercises: Seated   Sit to Sand 5 reps;without UE support     Knee/Hip Exercises: Supine   Bridges 10 reps   Straight Leg Raises 10 reps;2 sets     Knee/Hip Exercises: Sidelying   Hip ABduction 2 sets;10 reps                PT Education - 01/11/16 1037    Education provided Yes   Education Details reviewed HEP, and amb with a RW, added standing heel raises, and standing marching with UE support. Pt instructed when performing standing exercises have a chair behind him for safety.    Person(s) Educated Patient   Methods Explanation;Demonstration;Verbal cues   Comprehension Verbalized  understanding;Returned demonstration          PT Short Term Goals - 01/11/16 1041      PT SHORT TERM GOAL #1   Title Pt will be independent in his HEP   Baseline minor cues   Period Weeks   Status On-going           PT Long Term Goals - 01/11/16 1042      PT LONG TERM GOAL #1   Title Pt will improve bilateral knee flexion/extension strength to 5/5 in order to improve functional mobility an gait.    Baseline grossly -4/5   Time 6   Period Weeks   Status New     PT LONG TERM GOAL #2   Title pt will be able to amb 30 minutes with pain less than 3/10 in bilateral knees.    Baseline 6/10 at rest   Time 6   Period Weeks   Status New     PT LONG TERM GOAL #3   Title pt will improve his FOTO score from 32% limitation to 25% limitation   Time 6   Period Weeks   Status New               Plan - 01/11/16 1038    Clinical Impression Statement Pt reporting no falls since last visit. Pt has been using his loaner RW provided by the PT clinic last visit. Pt reporting doing his HEP and reviewed his program today. pt tolerating well. Continue to progress toward pt's goals set. No goals met at present.    Rehab Potential Good   PT Frequency 2x / week   PT Duration 6 weeks   PT Treatment/Interventions ADLs/Self Care Home Management;Cryotherapy;Electrical Stimulation;Iontophoresis 73m/ml Dexamethasone;Functional mobility training;Stair training;Gait training;Moist Heat;Therapeutic activities;Therapeutic exercise;Balance training;Neuromuscular re-education;Patient/family education;Passive range of motion;Manual techniques;Vasopneumatic Device   PT Next Visit Plan begin Nustep/Bike, bilateral knee strengthening exercises. Begin adding weights to LE exercises as pt tolerates.    PT Home Exercise Plan SLR, Hip abd in sidelying, mini-squats   Consulted and Agree with Plan of Care Patient      Patient will benefit from skilled therapeutic intervention in order to improve the  following deficits and impairments:  Decreased mobility, Decreased strength, Decreased balance, Decreased activity tolerance, Pain, Abnormal gait, Difficulty walking  Visit Diagnosis: Chronic pain of left knee  Chronic pain of right knee  Difficulty in walking, not elsewhere classified  Muscle weakness (generalized)     Problem List Patient Active Problem List   Diagnosis Date Noted  . [place]ipheral vascular disease ([place] 11/15/2015  . Alcohol abuse 09/02/2015  . Tobacco abuse 09/02/2015  . Tricompartment osteoarthritis  of both knees 06/15/2015  . Chronic abdominal pain 03/14/2015  . Vitamin D deficiency 03/12/2015  . Hyperlipidemia 02/17/2015  . Fall 12/24/2014  . History of alcohol abuse 12/05/2014  . Protein-calorie malnutrition, severe 11/18/2014  . Cervical arthritis ([place]) 01/01/2014  . Chronic pancreatitis ([place]) 02/24/2013  . Health care maintenance 08/25/2011  . Chronic alcoholic pancreatitis ([place]) 04/20/2011  . Type 2 diabetes mellitus with peripheral neuropathy ([place]) 12/09/2010  . HTN (hypertension) 12/09/2010    Oretha Caprice, MPT  01/11/2016, 10:57 AM  [facility] [address] [place], [place], [address] Phone: [phone]   Fax:  [phone]  Name: Michael [name] MRN: [id number] Date of Birth: 1955-10-29

## 2016-01-13 ENCOUNTER — Telehealth: Payer: Self-pay | Admitting: Internal Medicine

## 2016-01-13 NOTE — Telephone Encounter (Signed)
APT. REMINDER CALL, LMTCB °

## 2016-01-14 ENCOUNTER — Encounter: Payer: Self-pay | Admitting: Internal Medicine

## 2016-01-14 ENCOUNTER — Ambulatory Visit (INDEPENDENT_AMBULATORY_CARE_PROVIDER_SITE_OTHER): Payer: Medicare Other | Admitting: Internal Medicine

## 2016-01-14 VITALS — BP 116/65 | HR 96 | Temp 97.9°F | Ht 64.0 in | Wt 152.9 lb

## 2016-01-14 DIAGNOSIS — E1142 Type 2 diabetes mellitus with diabetic polyneuropathy: Secondary | ICD-10-CM

## 2016-01-14 DIAGNOSIS — Z23 Encounter for immunization: Secondary | ICD-10-CM | POA: Diagnosis not present

## 2016-01-14 DIAGNOSIS — I10 Essential (primary) hypertension: Secondary | ICD-10-CM

## 2016-01-14 DIAGNOSIS — L853 Xerosis cutis: Secondary | ICD-10-CM | POA: Diagnosis not present

## 2016-01-14 DIAGNOSIS — Z0000 Encounter for general adult medical examination without abnormal findings: Secondary | ICD-10-CM

## 2016-01-14 DIAGNOSIS — M170 Bilateral primary osteoarthritis of knee: Secondary | ICD-10-CM

## 2016-01-14 DIAGNOSIS — E1165 Type 2 diabetes mellitus with hyperglycemia: Secondary | ICD-10-CM | POA: Diagnosis not present

## 2016-01-14 DIAGNOSIS — Z794 Long term (current) use of insulin: Secondary | ICD-10-CM

## 2016-01-14 DIAGNOSIS — F17210 Nicotine dependence, cigarettes, uncomplicated: Secondary | ICD-10-CM

## 2016-01-14 LAB — POCT GLYCOSYLATED HEMOGLOBIN (HGB A1C): HEMOGLOBIN A1C: 11.5

## 2016-01-14 LAB — GLUCOSE, CAPILLARY: GLUCOSE-CAPILLARY: 404 mg/dL — AB (ref 65–99)

## 2016-01-14 MED ORDER — INSULIN ASPART 100 UNIT/ML ~~LOC~~ SOLN: 8.0000 [IU] | Freq: Every day | SUBCUTANEOUS | 1 refills | Status: DC

## 2016-01-14 NOTE — Progress Notes (Signed)
   CC: Hand and leg pains  HPI:  Michael [name] is a 60 y.o. man with history of chronic pancreatitis, type 2 diabetes, peripheral vascular disease, who presents today for several problems but predominantly his chronic diffuse pains, worst in hands and legs. These are ongoing for years with previous evaluations indicating arthritis changes plus some extent of neuropathy. He takes ibuprofen with minimal relief of symptoms. He has stopped taking his Elavil as prescribed because he thinks it causes too much drowsiness. He has started working with physical therapy since November and thinks he is improving in mobility somewhat with these sessions.  See problem based assessment and plan below for additional details.  Past Medical History:  Diagnosis Date  . Diabetes mellitus without complication ([place])   . Pancreatitis   . Seizures ([place])     Review of Systems:   Constitutional: Negative for fever.  Eyes: Negative for blurred vision.  Respiratory: Negative for shortness of breath.   Cardiovascular: Negative for chest pain.  Gastrointestinal: Positive for diarrhea.  Genitourinary: Positive for frequency.  Musculoskeletal: Positive for joint pain. Negative for back pain and myalgias.  Neurological: Negative for sensory change and headaches.  Endo/Heme/Allergies: Negative for polydipsia  Physical Exam:  Vitals:   01/14/16 1331  BP: 116/65  Pulse: 96  Temp: 97.9 F (36.6 C)  TempSrc: Oral  SpO2: 100%  Weight: 152 lb 14.4 oz (69.4 kg)  Height: 5\' 4"  (1.626 m)   GENERAL- alert, co-operative, NAD HEENT- Atraumatic, PERRL, oral mucosa appears moist CARDIAC- RRR, no murmurs, rubs or gallops. RESP- CTAB, no wheezes or crackles. ABDOMEN- Nontender, no guarding or rebound NEURO- Strength upper and lower extremities- 5/5, Sensation grossly intact globally EXTREMITIES- No pedal edema, no foot ulceration or heavy calluses, No synovitis in hands, range of motion somewhat restricted by  tightness in straight leg raise, in wrist flexion SKIN- Warm, dry, No rash or lesion. PSYCH- Normal mood and affect, appropriate thought content and speech.  Assessment & Plan:   See Encounters Tab for problem based charting.  Patient discussed with Dr. Evette Doffing

## 2016-01-14 NOTE — Patient Instructions (Addendum)
It was a pleasure to see you [name]. Today we discussed several changes to the treatment plan:  1. Start taking novolog insulin 8 Units once daily before your largest meal/supper 2. Try the topical lidocaine for pain especially in your hands 3. Use a vasoline or other emollient (moisturizer) for your feet to heal the dry skin 4. Continue to work with physical therapy especially to strength and improve range of motion since this is worsening pain 5. Follow up with [facility] for recommendations and discuss sleep difficulty 6. Return to clinic in about a month or sooner if needed

## 2016-01-18 ENCOUNTER — Ambulatory Visit: Payer: Self-pay | Admitting: Pharmacist

## 2016-01-18 ENCOUNTER — Other Ambulatory Visit: Payer: Self-pay | Admitting: Pharmacist

## 2016-01-18 ENCOUNTER — Encounter: Payer: Self-pay | Admitting: Pharmacist

## 2016-01-18 ENCOUNTER — Other Ambulatory Visit: Payer: Self-pay | Admitting: Internal Medicine

## 2016-01-18 DIAGNOSIS — L853 Xerosis cutis: Secondary | ICD-10-CM | POA: Insufficient documentation

## 2016-01-18 NOTE — Assessment & Plan Note (Signed)
Complaining of particularly dry skin with pruritis on his shins and feet bilaterally. This is worse since weather became colder and drier. There is no inflammation or lesion present on examinaton.  -Recommended him to start using vasoline or topical emollient daily on the affected area until improving.

## 2016-01-18 NOTE — Assessment & Plan Note (Addendum)
Lab Results  Component Value Date   HGBA1C 11.5 01/14/2016   HGBA1C 10.6 10/15/2015   HGBA1C 9.6 07/05/2015     Assessment: Diabetes control: Uncontrolled Progress toward A1C goal:  Worsened Comments: Glucometer log unavailable for review at this visit. He is consistently hyperglycemic without any new episodes of major hypoglycemia. He is behaving now as a pretty burnt out diabetic, perhaps from concurrent pancreatitis. He had hypoglycemic seizures as recently as 2016 on a dose of 40 units of lantus, although he reports getting as high as 100 units TDD in the past. He is on 32 units of basal insulin at this time, and adding some prandial coverage might help get his TDD higher while minimizing hypoglycemic risk. Any complicated regimen is going to suffer since he misses doses intermittently.   For his peripheral neuropathy I stressed diabetes control will be needed for this to get better. I  also recommended he follow up at [facility] again to discuss medications if his Elavil is causing  sleep problems. This is a bit confusing to me though since he complains of both insomnia and e xcessive sleepiness from the medicine.  Plan: Medications:  Continue insulin 28 units long acting, start novolog short acting 8 units daily before largest meal Home glucose monitoring: Frequency: 3 times daily Timing: before meals Instruction/counseling given: reminded to get eye exam, reminded to bring blood glucose meter & log to each visit and discussed foot care Educational resources provided:   Self management tools provided:   Other plans: labs fine 4 months ago, will plan to repeat within next 1-2 including urine protein

## 2016-01-18 NOTE — Assessment & Plan Note (Addendum)
He complains about this problem again today without getting much relief on oral NSAIDs. He is participating with PT and does seem to be getting some benefit. On exam he has a lot of muscle tightness and sensitivity so I think he needs to stretch significantly more because this is worsening his pain experience. He brought a prescription request for a lidocaine gel to use on his hands or knees as needed since he thinks the voltaren did not do too much.   Continue PT Increase stretching especially hamstrings and calf Try topical lidocaine as PRN

## 2016-01-18 NOTE — Patient Outreach (Signed)
[facility]) Care Management  01/18/2016  CARVIS MANKIEWICZ 1955/04/04 [id number]   60 year old male referred to [place] [facility] for medication adherence. Called patient today to reschedule pharmacy home visit after unsuccessful outreach attempts the past 2 weeks.  Was unable to reach patient via telephone and left HIPAA compliant message.  This is unsuccessful outreach attempt #3 and an unsuccessful outreach letter has been mailed to patient.  Did receive call from patients sister but she states that she has been unable to reach Mr Cimorelli and she does not know where he is.     Plan: Will allow patient 10 days to contact [facility] then will plan to close case as [facility] pharmacist has had 3 unsuccessful outreach attempts in trying to contact patient  [name], PharmD [facility] PGY2 Pharmacy Resident [phone]

## 2016-01-18 NOTE — Assessment & Plan Note (Signed)
Flu shot given today. Prevnar vaccine administered today.

## 2016-01-19 NOTE — Progress Notes (Signed)
Internal Medicine Clinic Attending  Case discussed with Dr. Rice at the time of the visit.  We reviewed the resident's history and exam and pertinent patient test results.  I agree with the assessment, diagnosis, and plan of care documented in the resident's note.  

## 2016-01-20 ENCOUNTER — Ambulatory Visit: Payer: Medicare Other

## 2016-01-20 DIAGNOSIS — G8929 Other chronic pain: Secondary | ICD-10-CM | POA: Diagnosis not present

## 2016-01-20 DIAGNOSIS — M25561 Pain in right knee: Secondary | ICD-10-CM | POA: Diagnosis not present

## 2016-01-20 DIAGNOSIS — R262 Difficulty in walking, not elsewhere classified: Secondary | ICD-10-CM | POA: Diagnosis not present

## 2016-01-20 DIAGNOSIS — M25562 Pain in left knee: Principal | ICD-10-CM

## 2016-01-20 DIAGNOSIS — M6281 Muscle weakness (generalized): Secondary | ICD-10-CM | POA: Diagnosis not present

## 2016-01-20 NOTE — Therapy (Signed)
[place], [place], [address] Phone: [phone]   Fax:  [phone]  Physical Therapy Treatment  Patient Details  Name: Michael [name] MRN: [id number] Date of Birth: [birth year] Referring Provider: Vernelle Emerald, MD  Encounter Date: 01/20/2016      PT End of Session - 01/20/16 0952    Visit Number 4   Number of Visits 12   Date for PT Re-Evaluation 02/18/16   Authorization Type kx modifier 15 visit   PT Start Time 1000   PT Stop Time 1045   PT Time Calculation (min) 45 min   Activity Tolerance Patient tolerated treatment well   Behavior During Therapy [facility] for tasks assessed/performed      Past Medical History:  Diagnosis Date  . Diabetes mellitus without complication ([place])   . Pancreatitis   . Seizures ([place])     Past Surgical History:  Procedure Laterality Date  . BALLOON DILATION  03/22/2011   Procedure: BALLOON DILATION;  Surgeon: Gatha Mayer, MD;  [facility]: [facility];  Service: Endoscopy;  Laterality: N/A;  . COLONOSCOPY N/A 05/22/2012   Procedure: COLONOSCOPY;  Surgeon: Gatha Mayer, MD;  Location: [place];  Service: Endoscopy;  Laterality: N/A;  . COLONOSCOPY W/ BIOPSIES AND POLYPECTOMY  09/12/11  . ESOPHAGOGASTRODUODENOSCOPY  03/22/2011   Procedure: ESOPHAGOGASTRODUODENOSCOPY (EGD);  Surgeon: Gatha Mayer, MD;  Location: [facility];  Service: Endoscopy;  Laterality: N/A;  egd with balloon   . EUS  04/20/2011   Procedure: UPPER ENDOSCOPIC ULTRASOUND (EUS) LINEAR;  Surgeon: Milus Banister, MD;  [facility];  Service: Endoscopy;  Laterality: N/A;    There were no vitals filed for this visit.      Subjective Assessment - 01/20/16 0954    Subjective tOOK MEDS SO NO PAIN FOR  NOW.     Currently in Pain? No/denies            [facility] PT Assessment - 01/20/16 1010      Strength   Right Knee Flexion 5/5   Right Knee Extension 5/5   Left Knee Flexion 5/5    Left Knee Extension 5/5     Flexibility   Soft Tissue Assessment /Muscle Length yes   Hamstrings 60 degrees bilaterally     Standardized Balance Assessment   Standardized Balance Assessment Berg Balance Test     Berg Balance Test   Sit to Stand Able to stand without using hands and stabilize independently   Standing Unsupported Able to stand safely 2 minutes   Sitting with Back Unsupported but Feet Supported on Floor or Stool Able to sit safely and securely 2 minutes   Stand to Sit Sits safely with minimal use of hands   Transfers Able to transfer safely, minor use of hands   Standing Unsupported with Eyes Closed Able to stand 10 seconds safely   Standing Ubsupported with Feet Together Able to place feet together independently and stand 1 minute safely   From Standing, Reach Forward with Outstretched Arm Can reach forward >5 cm safely (2")   From Standing Position, Pick up Object from Floor Able to pick up shoe safely and easily   From Standing Position, Turn to Look Behind Over each Shoulder Turn sideways only but maintains balance   Turn 360 Degrees Able to turn 360 degrees safely in 4 seconds or less   Standing Unsupported, Alternately Place Feet on Step/Stool Able to stand independently and complete 8 steps >20 seconds  Standing Unsupported, One Foot in Front Able to plae foot ahead of the other independently and hold 30 seconds   Standing on One Leg Tries to lift leg/unable to hold 3 seconds but remains standing independently   Total Score 47     Timed Up and Go Test   Normal TUG (seconds) 15                     [facility] Adult PT Treatment/Exercise - 01/20/16 0001      Knee/Hip Exercises: Aerobic   Nustep  LE only 6 min     Knee/Hip Exercises: Seated   Long Arc Quad Right;Left;15 reps   Long Arc Quad Weight 5 lbs.  5 sec hold     Knee/Hip Exercises: Supine   Bridges Both;15 reps   Straight Leg Raises 10 reps;2 sets     Knee/Hip Exercises: Sidelying   Hip  ABduction Right;Left;10 reps   Hip ABduction Limitations 2 pounds with cues for maintaining posture     Knee/Hip Exercises: Prone   Hamstring Curl 20 reps   Hamstring Curl Limitations RT/ LT 2  pounds   Straight Leg Raises Right;Left   Straight Leg Raises Limitations 12 reps 2 pounds                  PT Short Term Goals - 01/20/16 1041      PT SHORT TERM GOAL #1   Title Pt will be independent in his HEP   Baseline minor cues   Status Partially Met           PT Long Term Goals - 01/20/16 1041      PT LONG TERM GOAL #1   Title Pt will improve bilateral knee flexion/extension strength to 5/5 in order to improve functional mobility an gait.    Status Achieved     PT LONG TERM GOAL #2   Title pt will be able to amb 30 minutes with pain less than 3/10 in bilateral knees.    Baseline 30 min  pain varies   Status Partially Met     PT LONG TERM GOAL #3   Title pt will improve his FOTO score from 32% limitation to 25% limitation   Baseline 50 % limited today   Status On-going               Plan - 01/20/16 0953    Clinical Impression Statement Pt continues with LE weakness and his attendance has been with significant gaps between sessions. He reports that is fixed and expects to make appointments. He was not using the walker today and said he uses at times. We have order for walker.   His scheduled appointments were past POC end date so renewal done with assessment. of strength and balance.  His FOTO score today indicated he is worse than before but his pain level is improved and balance indicates need for cane not walker.     PT Treatment/Interventions ADLs/Self Care Home Management;Cryotherapy;Electrical Stimulation;Iontophoresis 4mg /ml Dexamethasone;Functional mobility training;Stair training;Gait training;Moist Heat;Therapeutic activities;Therapeutic exercise;Balance training;Neuromuscular re-education;Patient/family education;Passive range of motion;Manual  techniques;Vasopneumatic Device   PT Next Visit Plan continue Nustep/Bike, bilateral knee strengthening exercises. Begin adding weights to LE exercises as pt tolerates.    PT Home Exercise Plan SLR, Hip abd in sidelying, mini-squats   Consulted and Agree with Plan of Care Patient      Patient will benefit from skilled therapeutic intervention in order to improve the following deficits and impairments:  Decreased mobility, Decreased strength, Decreased balance, Decreased activity tolerance, Pain, Abnormal gait, Difficulty walking  Visit Diagnosis: Chronic pain of left knee  Chronic pain of right knee  Difficulty in walking, not elsewhere classified  Muscle weakness (generalized)       G-Codes - 2016/02/14 1048    Functional Assessment Tool Used FOTO   Functional Limitation Mobility: Walking and moving around   Mobility: Walking and Moving Around Current Status [phone]) At least 40 percent but less than 60 percent impaired, limited or restricted   Mobility: Walking and Moving Around Goal Status [phone]) At least 20 percent but less than 40 percent impaired, limited or restricted      Problem List Patient Active Problem List   Diagnosis Date Noted  . Dry skin 01/18/2016  . Peripheral vascular disease ([place]) 11/15/2015  . Alcohol abuse 09/02/2015  . Tobacco abuse 09/02/2015  . Tricompartment osteoarthritis of both knees 06/15/2015  . Chronic abdominal pain 03/14/2015  . Vitamin D deficiency 03/12/2015  . Hyperlipidemia 02/17/2015  . Fall 12/24/2014  . History of alcohol abuse 12/05/2014  . Protein-calorie malnutrition, severe 11/18/2014  . Cervical arthritis ([place]) 01/01/2014  . Chronic pancreatitis ([place]) 02/24/2013  . Health care maintenance 08/25/2011  . Chronic alcoholic pancreatitis ([place]) 04/20/2011  . Type 2 diabetes mellitus with peripheral neuropathy ([place]) 12/09/2010  . HTN (hypertension) 12/09/2010    Darrel Hoover  PT 14-Feb-2016, 10:53 AM  [facility] [address] [place], [place], [address] Phone: [phone]   Fax:  [phone]  Name: Michael [name] MRN: [id number] Date of Birth: Jan 19, 1956

## 2016-01-24 ENCOUNTER — Telehealth: Payer: Self-pay | Admitting: Internal Medicine

## 2016-01-24 DIAGNOSIS — E0810 Diabetes mellitus due to underlying condition with ketoacidosis without coma: Secondary | ICD-10-CM

## 2016-01-24 DIAGNOSIS — E1142 Type 2 diabetes mellitus with diabetic polyneuropathy: Secondary | ICD-10-CM

## 2016-01-24 DIAGNOSIS — Z794 Long term (current) use of insulin: Secondary | ICD-10-CM

## 2016-01-24 DIAGNOSIS — K860 Alcohol-induced chronic pancreatitis: Secondary | ICD-10-CM

## 2016-01-24 MED ORDER — GLUCOSE BLOOD VI STRP: ORAL_STRIP | 3 refills | Status: DC

## 2016-01-24 MED ORDER — IBUPROFEN 800 MG PO TABS: 800.0000 mg | ORAL_TABLET | Freq: Three times a day (TID) | ORAL | 0 refills | Status: DC | PRN

## 2016-01-24 NOTE — Telephone Encounter (Signed)
   Reason for call:   I received a call from [name] at 9:47 PM indicating that he needed a refill on his accu-check strips and Ibuprofen.   Pertinent Data:   Patient reports chronic pain from arthritis in multiple joints and diabetic neuropathy.  He is requesting a refill of Ibuprofen 800 mg q8h prn for his pain. Patient reported to PCP on 01/14/16 that the Ibuprofen did not help much, but tells me that it has been relieving some of his pain.  He is also requesting a refill on his Accu-check strips.  Patient requesting refills be sent to [facility] on [address].  Next appointment with his PCP, Dr. Benjamine Mola, is on 02/10/16 @ 1:15 pm.   Assessment / Plan / Recommendations:   Will refill Ibuprofen 800 mg q8h prn #21 for 1 week supply.  Will refill Accu-chek Aviva Plus test strips #100/month.  Above prescriptions sent to [facility] per patient request.  Patient advised to follow up with PCP as scheduled or call clinic for an acute visit if needed.   As always, pt is advised that if symptoms worsen or new symptoms arise, they should go to an urgent care facility or to to ER for further evaluation.   Zada Finders, MD   01/24/2016, 10:23 PM

## 2016-01-25 ENCOUNTER — Ambulatory Visit: Payer: Medicare Other | Attending: Internal Medicine | Admitting: Physical Therapy

## 2016-01-25 ENCOUNTER — Telehealth: Payer: Self-pay | Admitting: Internal Medicine

## 2016-01-25 DIAGNOSIS — M6281 Muscle weakness (generalized): Secondary | ICD-10-CM | POA: Diagnosis not present

## 2016-01-25 DIAGNOSIS — R262 Difficulty in walking, not elsewhere classified: Secondary | ICD-10-CM | POA: Insufficient documentation

## 2016-01-25 DIAGNOSIS — E1142 Type 2 diabetes mellitus with diabetic polyneuropathy: Secondary | ICD-10-CM

## 2016-01-25 DIAGNOSIS — M25561 Pain in right knee: Secondary | ICD-10-CM | POA: Insufficient documentation

## 2016-01-25 DIAGNOSIS — M25562 Pain in left knee: Secondary | ICD-10-CM | POA: Insufficient documentation

## 2016-01-25 DIAGNOSIS — G8929 Other chronic pain: Secondary | ICD-10-CM | POA: Diagnosis not present

## 2016-01-25 MED ORDER — INSULIN ASPART 100 UNIT/ML ~~LOC~~ SOLN: 8.0000 [IU] | Freq: Every day | SUBCUTANEOUS | 1 refills | Status: DC

## 2016-01-25 NOTE — Therapy (Signed)
[place], [place], [address] Phone: [phone]   Fax:  [phone]  Physical Therapy Treatment  Patient Details  Name: Michael [name] MRN: [id number] Date of Birth: 1955/10/20 Referring Provider: Vernelle Emerald, MD  Encounter Date: 01/25/2016      PT End of Session - 01/25/16 1238    Visit Number 5   Number of Visits 12   Date for PT Re-Evaluation 02/18/16   PT Start Time 1146   PT Stop Time 1235   PT Time Calculation (min) 49 min   Activity Tolerance Patient tolerated treatment well   Behavior During Therapy [facility] for tasks assessed/performed      Past Medical History:  Diagnosis Date  . Diabetes mellitus without complication ([place])   . Pancreatitis   . Seizures ([place])     Past Surgical History:  Procedure Laterality Date  . BALLOON DILATION  03/22/2011   Procedure: BALLOON DILATION;  Surgeon: Gatha Mayer, MD;  [facility];  Service: Endoscopy;  Laterality: N/A;  . COLONOSCOPY N/A 05/22/2012   Procedure: COLONOSCOPY;  Surgeon: Gatha Mayer, MD;  Location: [facility];  Service: Endoscopy;  Laterality: N/A;  . COLONOSCOPY W/ BIOPSIES AND POLYPECTOMY  09/12/11  . ESOPHAGOGASTRODUODENOSCOPY  03/22/2011   Procedure: ESOPHAGOGASTRODUODENOSCOPY (EGD);  Surgeon: Gatha Mayer, MD;  Location[facility];  Service: Endoscopy;  Laterality: N/A;  egd with balloon   . EUS  04/20/2011   Procedure: UPPER ENDOSCOPIC ULTRASOUND (EUS) LINEAR;  Surgeon: Milus Banister, MD;  [facility];  Service: Endoscopy;  Laterality: N/A;    There were no vitals filed for this visit.      Subjective Assessment - 01/25/16 1149    Subjective 4-5/10 pain then pain goes away.  i feel like I am walking a lot better.  My legs are gfetting stronger.    Currently in Pain? Yes   Pain Score 5    Pain Location Knee   Pain Orientation Right;Left   Pain Descriptors / Indicators Aching;Burning   Pain  Onset More than a month ago   Pain Frequency Constant   Aggravating Factors  cold weather   Pain Relieving Factors Meds, exercise   Multiple Pain Sites No                         [facility] Adult PT Treatment/Exercise - 01/25/16 0001      Knee/Hip Exercises: Stretches   Gastroc Stretch 3 reps;30 seconds  3 X incline board   Gastroc Stretch Limitations cues   Other Knee/Hip Stretches etween knees for medial lateral support     Knee/Hip Exercises: Aerobic   Nustep Legs only,  L5 10 minutes     Knee/Hip Exercises: Machines for Strengthening   Cybex Leg Press 10 X 3 sets, 1 plate 10 x, 2 plates 20 x     Knee/Hip Exercises: Standing   Heel Raises 1 set;10 reps   Heel Raises Limitations uses hands   Forward Step Up --  10 each cued toi avoid  snap on left.     Other Standing Knee Exercises terminal knee extension 10 x 2 sets left only.  ball between knees for hip position and behind knee at wall     Knee/Hip Exercises: Seated   Long Arc Quad Right   Long Arc Quad Weight 5 lbs.  5 second holds   Hamstring Curl 10 reps;2 sets   Hamstring Limitations red band  Sit to Sand 5 reps  no hands,  mild pain both     Knee/Hip Exercises: Sidelying   Clams 10 X 2 sets, min assist for position of hips.                  PT Short Term Goals - 01/25/16 1243      PT SHORT TERM GOAL #1   Title Pt will be independent in his HEP   Time 3   Period Weeks   Status Unable to assess           PT Long Term Goals - 01/25/16 1243      PT LONG TERM GOAL #1   Title Pt will improve bilateral knee flexion/extension strength to 5/5 in order to improve functional mobility an gait.    Time 6   Period Weeks   Status Achieved     PT LONG TERM GOAL #2   Title pt will be able to amb 30 minutes with pain less than 3/10 in bilateral knees.    Time 6   Period Weeks   Status Unable to assess     PT LONG TERM GOAL #3   Title pt will improve his FOTO score from 32%  limitation to 25% limitation   Time 6   Period Weeks   Status Unable to assess               Plan - 01/25/16 1238    Clinical Impression Statement No pain at end of session.  i feel the exercise, that's all.  Gait much improved.  Patient now requestion a cane vs walker. Patient needs more work with terminal knee control left. No new goals met   PT Next Visit Plan continue Nustep/Bike, bilateral knee strengthening exercises. Begin adding weights to LE exercises as pt tolerates.    PT Home Exercise Plan SLR, Hip abd in sidelying, mini-squats   Consulted and Agree with Plan of Care Patient      Patient will benefit from skilled therapeutic intervention in order to improve the following deficits and impairments:  Decreased mobility, Decreased strength, Decreased balance, Decreased activity tolerance, Pain, Abnormal gait, Difficulty walking  Visit Diagnosis: Chronic pain of left knee  Chronic pain of right knee  Difficulty in walking, not elsewhere classified  Muscle weakness (generalized)     Problem List Patient Active Problem List   Diagnosis Date Noted  . Dry skin 01/18/2016  . Peripheral vascular disease ([place]) 11/15/2015  . Alcohol abuse 09/02/2015  . Tobacco abuse 09/02/2015  . Tricompartment osteoarthritis of both knees 06/15/2015  . Chronic abdominal pain 03/14/2015  . Vitamin D deficiency 03/12/2015  . Hyperlipidemia 02/17/2015  . Fall 12/24/2014  . History of alcohol abuse 12/05/2014  . Protein-calorie malnutrition, severe 11/18/2014  . Cervical arthritis ([place]) 01/01/2014  . Chronic pancreatitis ([place]) 02/24/2013  . Health care maintenance 08/25/2011  . Chronic alcoholic pancreatitis ([place]) 04/20/2011  . Type 2 diabetes mellitus with peripheral neuropathy ([place]) 12/09/2010  . HTN (hypertension) 12/09/2010    [name],[name] PTA 01/25/2016, 12:44 PM  [facility] [address] [place], [place],  [address] Phone: [phone]   Fax:  [phone]  Name: Michael [name] MRN: [id number] Date of Birth: 25-Sep-1955

## 2016-01-25 NOTE — Telephone Encounter (Signed)
   Reason for call:   I received a call from Mr. [name] at 9:34 PM requesting his Novolog prescription to be sent to the correct pharmacy.    Pertinent Data:   Patient is requesting for his Novolog prescription to to sent to [facility] on [address]  Reports taking Ibuprofen 800 mg 2-3 times per day for chronic joint pains from arthritis. Also taking Gabapentin for peripheral neuropathy. Patient states Ibuprofen is helping him somewhat. He is requesting "percocets or oxycodones" for his chronic pain.   Assessment / Plan / Recommendations:   Novolog prescription has been sent to the correct pharmacy  Patient has an appointment with his PCP on 02/10/16. Will send a message to our clinic staff to schedule him for an appointment sooner to discuss his chronic pain with a physician in the clinic.  As always, pt is advised that if symptoms worsen or new symptoms arise, they should go to an urgent care facility or to to ER for further evaluation.   Shela Leff, MD   01/25/2016, 10:21 PM

## 2016-01-26 ENCOUNTER — Ambulatory Visit: Payer: Medicare Other

## 2016-01-26 ENCOUNTER — Telehealth: Payer: Self-pay | Admitting: *Deleted

## 2016-01-26 ENCOUNTER — Other Ambulatory Visit: Payer: Self-pay | Admitting: Pharmacist

## 2016-01-26 NOTE — Patient Outreach (Addendum)
[facility]) Care Management  01/26/2016  Michael [name] 05-17-1955 [id number]   61 year old male previously seen by me for medication adherence with assistance filling his pill box.  Returned patient call today to reschedule home visit for this week for pill box refill.  Patient states adherence to his medications even though pill box was last filled 12/22/2015 for a 2 week supply.  Patient does also report adherence to his Levemir but states he has not yet started/received Novolog.  He does have questionable adherence to his medications.  He reports that he is having neuropathic pain today and that he has restarted taking amitriptyline as "it is the only medication that will help him sleep."  He states his CBG meter is still at his girlfriends house but he plans to obtain it prior to our visit on Friday.  Plan:  Followup home visit on Friday to assess medication adherence and will followup initiation of Novolog and provide patient education.   Instructed patient to obtain his CBG meter from his girlfriends house for our home visit.  [name], PharmD, [place] PGY2 Pharmacy Resident [phone]

## 2016-01-26 NOTE — Telephone Encounter (Signed)
Pt calling, requesting to see a doctor. States he's on "all these medications and still in pain". Pt upset; using a few curse words. States he's sorry but "I'm hurting". Appt given for today in [facility] @ 1545PM.

## 2016-01-27 ENCOUNTER — Ambulatory Visit: Payer: Medicare Other

## 2016-01-28 ENCOUNTER — Ambulatory Visit: Payer: Self-pay | Admitting: Pharmacist

## 2016-01-28 ENCOUNTER — Other Ambulatory Visit: Payer: Self-pay | Admitting: Pharmacist

## 2016-02-01 ENCOUNTER — Ambulatory Visit: Payer: Medicare Other | Admitting: Physical Therapy

## 2016-02-02 ENCOUNTER — Encounter: Payer: Self-pay | Admitting: Internal Medicine

## 2016-02-02 ENCOUNTER — Ambulatory Visit (INDEPENDENT_AMBULATORY_CARE_PROVIDER_SITE_OTHER): Payer: Medicare Other | Admitting: Internal Medicine

## 2016-02-02 DIAGNOSIS — Z794 Long term (current) use of insulin: Secondary | ICD-10-CM | POA: Diagnosis not present

## 2016-02-02 DIAGNOSIS — Z79899 Other long term (current) drug therapy: Secondary | ICD-10-CM | POA: Diagnosis not present

## 2016-02-02 DIAGNOSIS — E1142 Type 2 diabetes mellitus with diabetic polyneuropathy: Secondary | ICD-10-CM | POA: Diagnosis not present

## 2016-02-02 DIAGNOSIS — E0810 Diabetes mellitus due to underlying condition with ketoacidosis without coma: Secondary | ICD-10-CM

## 2016-02-02 MED ORDER — GLUCOSE BLOOD VI STRP: ORAL_STRIP | 3 refills | Status: DC

## 2016-02-02 MED ORDER — INSULIN ASPART 100 UNIT/ML ~~LOC~~ SOLN: 8.0000 [IU] | Freq: Every day | SUBCUTANEOUS | 1 refills | Status: DC

## 2016-02-02 MED ORDER — GLUCOSE BLOOD VI STRP: ORAL_STRIP | 1 refills | Status: DC

## 2016-02-02 MED ORDER — DULOXETINE HCL 30 MG PO CPEP
30.0000 mg | ORAL_CAPSULE | Freq: Every day | ORAL | 0 refills | Status: DC
Start: 2016-02-02 — End: 2016-02-25

## 2016-02-02 NOTE — Progress Notes (Signed)
   CC: Pain all over  HPI:  [name] is a 61 y.o. man with chronic pancreatitis, type 2 diabetes, peripheral vascular disease who is is here today with continuing  pain all over in his knees, legs, hands, shoulders that is bad enough he can't sleep well. The cold weather worsens his joint pains. He is participating at physical therapy first session on 1/9 where he was able to participate but it was difficult due to pain. He is taking the ibuprofen with almost no benefit. The amitryptiline helps his pain but he feels it causes too much sedation the next morning so he stopped taking it. He is not taking the gabapentin as prescribed due to trouble taking so many pills. He tried topical voltaren and lidocaine gels without much improvement. He denies any joint swelling associated with this pain.  He has yet to follow up with behavioral health in any capacity since stopping using his Elavil regularly. He is not on any other therapy right now for mood stabilization. His reports his mood has been bad due to hurting all the time and not sleeping well.  He has not yet started taking short acting insulin at his evening mealtimes because the Novolog ordered was not covered on his formulary needing Humalog instead. He has not suffered any hypoglycemic episodes that he knows.  See problem based assessment and plan below for additional details  Past Medical History:  Diagnosis Date  . Diabetes mellitus without complication ([place])   . Pancreatitis   . Seizures ([place])     Review of Systems:  Review of Systems  Constitutional: Negative for fever.  HENT: Negative for hearing loss.   Respiratory: Negative for shortness of breath.   Cardiovascular: Negative for chest pain and leg swelling.  Musculoskeletal: Positive for back pain, joint pain, myalgias and neck pain. Negative for falls.  Neurological: Negative for dizziness and focal weakness.  Endo/Heme/Allergies: Does not bruise/bleed easily.    Psychiatric/Behavioral: Negative for suicidal ideas. The patient has insomnia.     Physical Exam: Physical Exam  Constitutional: He is oriented to person, place, and time and well-developed, well-nourished, and in no distress. No distress.  HENT:  Head: Normocephalic and atraumatic.  Eyes:  Bilateral conjunctival injection  Neck: Normal range of motion.  Cardiovascular: Normal rate and regular rhythm.   Pulmonary/Chest: Effort normal and breath sounds normal.  Musculoskeletal:  No pedal edema, no synovitis in either than, leg raise limited by pain in end ROM  Neurological: He is alert and oriented to person, place, and time.  Psychiatric:  Pressured speech with appropriate content     Vitals:   02/02/16 1018  BP: (!) 143/79  Pulse: 93  Temp: 98 F (36.7 C)  TempSrc: Oral  SpO2: 100%  Weight: 150 lb 12.8 oz (68.4 kg)  Height: 5\' 4"  (1.626 m)    Assessment & Plan:   See Encounters Tab for problem based charting.  Patient discussed with Dr. Angelia Mould

## 2016-02-02 NOTE — Patient Instructions (Signed)
It was pleasure to see you today Michael [name].  For your ongoing pain I recommend starting a medicine Cymbalta (duloxetine) taken at night. Uses start this medicine one pill 30 mg each night for the first 2 weeks then double it to 60 mg at night. I would like you to follow-up at our clinic again in about 3 weeks or so and see if you're getting any improvement.  Continuing to go and participate at physical therapy is extremely important in the meantime.  I also recommend you going to [facility] for more recommendations since her not able to tolerate taking the amitriptyline very well due to sleepiness.

## 2016-02-02 NOTE — Patient Outreach (Signed)
[facility]) Care Management  01/28/2016  Michael [name] 1955-12-02 [id number]   61 year old male previously seen by me for medication adherence with assistance filling his pill box. Patient had scheduled home visit today which he no showed.  [facility] pharmacy has had several consecutive no show appointments and unsuccessful outreach attempts.  Patient has not been reaching care management goals with adherence to medications or checking blood glucose.    Plan: [place] will close case as patient is unable to progress with the care management care plan and has been unable to contact.  Please reconsult if needed.   [name], PharmD, [place] PGY2 Pharmacy Resident [phone]

## 2016-02-03 ENCOUNTER — Telehealth: Payer: Self-pay | Admitting: *Deleted

## 2016-02-03 ENCOUNTER — Ambulatory Visit: Payer: Medicare Other | Admitting: Physical Therapy

## 2016-02-03 LAB — MICROALBUMIN / CREATININE URINE RATIO
CREATININE, UR: 95.8 mg/dL
MICROALBUM., U, RANDOM: 46.7 ug/mL
Microalb/Creat Ratio: 48.7 mg/g creat — ABNORMAL HIGH (ref 0.0–30.0)

## 2016-02-03 MED ORDER — INSULIN LISPRO 100 UNIT/ML (KWIKPEN): 8.0000 [IU] | PEN_INJECTOR | Freq: Every day | SUBCUTANEOUS | 1 refills | Status: DC

## 2016-02-03 NOTE — Assessment & Plan Note (Addendum)
A: For glycemic control he has not started the change of adding 8 units prandial coverage in evenings due to medication access. I am also still cocnerned about compliance as he admits to missing both pills and injections intermittently. For his ongoing pain there is a neuropathic component so I recommended trying duloxetine since he is no longer on Elavil and thinks gabapentin does not work well for him. He will need to follow up closely about his mood on this medicine since while he mostly complains of feeling down today he has a history with some mixed mood disorder features.  P: - Prescription sent for Humalog 8U prandial insulin at evening meal - Start duloxetine 30mg  daily for 2 weeks then increased to 60mg  daily if tolerating - Checking spot urine microalbumin/creatinine ratio today

## 2016-02-03 NOTE — Telephone Encounter (Signed)
Request from [facility] for med change for Novolog as patient's insurance plan does not cover.  Request was forwarded to Dr. Benjamine Mola for change.  Sander Nephew, RN 02/03/2016 9:46 AM

## 2016-02-04 NOTE — Assessment & Plan Note (Signed)
Diabetic supplies refilled today including lancets, strips, pen tips.

## 2016-02-08 ENCOUNTER — Ambulatory Visit: Payer: Medicare Other

## 2016-02-08 ENCOUNTER — Telehealth: Payer: Self-pay

## 2016-02-08 NOTE — Telephone Encounter (Signed)
Called Michael [name] and left message about his next appointment 02/08/16

## 2016-02-08 NOTE — Progress Notes (Signed)
Internal Medicine Clinic Attending  Case discussed with Dr. Rice at the time of the visit.  We reviewed the resident's history and exam and pertinent patient test results.  I agree with the assessment, diagnosis, and plan of care documented in the resident's note.  

## 2016-02-10 ENCOUNTER — Ambulatory Visit: Payer: Medicare Other

## 2016-02-10 ENCOUNTER — Other Ambulatory Visit: Payer: Self-pay | Admitting: Internal Medicine

## 2016-02-10 ENCOUNTER — Ambulatory Visit: Payer: Medicare Other | Admitting: Physical Therapy

## 2016-02-13 NOTE — Telephone Encounter (Signed)
   Reason for call:   I received a call from [name] at 4:30  PM indicating needing refill for ibuprofen.   Pertinent Data:   Wants ibuprofen for his chronic aches and pains.   Adamant that I prescribe him ibuprofen. He is using this for his osteoarthritis.   Assessment / Plan / Recommendations:   Given 15 tablets of ibuprofen  Asked him to make appointment in the clinic to be evaluated for this.  As always, pt is advised that if symptoms worsen or new symptoms arise, they should go to an urgent care facility or to to ER for further evaluation.   Burgess Estelle, MD   02/13/2016, 4:30 PM

## 2016-02-14 ENCOUNTER — Other Ambulatory Visit: Payer: Self-pay | Admitting: Internal Medicine

## 2016-02-17 ENCOUNTER — Encounter: Payer: Self-pay | Admitting: Physical Therapy

## 2016-02-17 ENCOUNTER — Ambulatory Visit: Payer: Medicare Other | Admitting: Physical Therapy

## 2016-02-17 ENCOUNTER — Other Ambulatory Visit: Payer: Self-pay | Admitting: Internal Medicine

## 2016-02-17 DIAGNOSIS — M25562 Pain in left knee: Principal | ICD-10-CM

## 2016-02-17 DIAGNOSIS — M25561 Pain in right knee: Secondary | ICD-10-CM | POA: Diagnosis not present

## 2016-02-17 DIAGNOSIS — M6281 Muscle weakness (generalized): Secondary | ICD-10-CM

## 2016-02-17 DIAGNOSIS — G8929 Other chronic pain: Secondary | ICD-10-CM

## 2016-02-17 DIAGNOSIS — R262 Difficulty in walking, not elsewhere classified: Secondary | ICD-10-CM | POA: Diagnosis not present

## 2016-02-17 NOTE — Therapy (Signed)
[place], [place], [address] Phone: [phone]   Fax:  [phone]  Physical Therapy Treatment  Patient Details  Name: Michael [name] MRN: [id number] Date of Birth: 23-Apr-1955 Referring Provider: Vernelle Emerald, MD  Encounter Date: 02/17/2016      PT End of Session - 02/17/16 0926    Visit Number 6   Number of Visits 12   Date for PT Re-Evaluation 02/18/16   Authorization Type kx modifier 15 visit   PT Start Time 0920   PT Stop Time 1005   PT Time Calculation (min) 45 min   Activity Tolerance Patient tolerated treatment well   Behavior During Therapy [facility] for tasks assessed/performed      Past Medical History:  Diagnosis Date  . Diabetes mellitus without complication ([place])   . Pancreatitis   . Seizures ([place])     Past Surgical History:  Procedure Laterality Date  . BALLOON DILATION  03/22/2011   Procedure: BALLOON DILATION;  Surgeon: Gatha Mayer, MD;  Location: [facility];  Service: Endoscopy;  Laterality: N/A;  . COLONOSCOPY N/A 05/22/2012   Procedure: COLONOSCOPY;  Surgeon: Gatha Mayer, MD;  Location: [facility];  Service: Endoscopy;  Laterality: N/A;  . COLONOSCOPY W/ BIOPSIES AND POLYPECTOMY  09/12/11  . ESOPHAGOGASTRODUODENOSCOPY  03/22/2011   Procedure: ESOPHAGOGASTRODUODENOSCOPY (EGD);  Surgeon: Gatha Mayer, MD;  Location: [facility];  Service: Endoscopy;  Laterality: N/A;  egd with balloon   . EUS  04/20/2011   Procedure: UPPER ENDOSCOPIC ULTRASOUND (EUS) LINEAR;  Surgeon: Milus Banister, MD;  Location: [facility];  Service: Endoscopy;  Laterality: N/A;    There were no vitals filed for this visit.      Subjective Assessment - 02/17/16 0922    Subjective Pt reporting 7/10 pain in his Left Knee and complaining of pain in bilateral LE's.  Pt reporting he has been doing his HEP   Pertinent History seizures   How long can you sit comfortably? 15 minutes   How long can  you stand comfortably? 30 minutes   How long can you walk comfortably? 30 minutes   Diagnostic tests MRI a few months ago per pt report   Patient Stated Goals be able to walk without pain   Currently in Pain? Yes   Pain Score 7    Pain Location Knee   Pain Orientation Left;Right  pain is greater on the left knee   Pain Descriptors / Indicators Aching;Constant   Pain Type Acute pain   Pain Radiating Towards down legs at times, pain in shoulders intermittently   Pain Onset 1 to 4 weeks ago   Pain Frequency Constant   Aggravating Factors  cold weather   Pain Relieving Factors meds   Effect of Pain on Daily Activities limited with ADL's and walking                         [facility] Adult PT Treatment/Exercise - 02/17/16 0001      Knee/Hip Exercises: Aerobic   Nustep Level 4, 8 minutes     Knee/Hip Exercises: Machines for Strengthening   Cybex Leg Press 25# 10 reps, 35# 10 reps     Knee/Hip Exercises: Standing   Heel Raises 15 reps   Heel Raises Limitations UE support   Hip Abduction 15 reps   Abduction Limitations UE support   Hip Extension 15 reps   Extension Limitations UE support   Forward Step  Up 10 reps   Forward Step Up Limitations UE support, verbal cues for sequencing   Other Standing Knee Exercises side stepping 10 feet x 4   Other Standing Knee Exercises hamstring curls 15 reps      Knee/Hip Exercises: Seated   Long Arc Quad Right;Left;Weights   Long Arc Quad Weight 4 lbs.   Long Arc Quad Limitations 10 reps   Sit to [employer] 10 reps     Knee/Hip Exercises: Sidelying   Hip ADduction 15 reps                PT Education - 02/17/16 0925    Education provided Yes   Education Details Pt instructed to continue his HEP and amb with a RW. Reviewed standing HEP   Person(s) Educated Patient   Methods Explanation;Demonstration   Comprehension Verbalized understanding;Returned demonstration          PT Short Term Goals - 02/17/16 0931      PT  SHORT TERM GOAL #1   Title Pt will be independent in his HEP   Baseline minor cues   Period Weeks   Status Achieved           PT Long Term Goals - 02/17/16 0931      PT LONG TERM GOAL #1   Title Pt will improve bilateral knee flexion/extension strength to 5/5 in order to improve functional mobility an gait.    Baseline grossly -4/5   Period Weeks   Status Achieved     PT LONG TERM GOAL #2   Title pt will be able to amb 30 minutes with pain less than 3/10 in bilateral knees.    Baseline 30 min  pain varies   Time 6   Period Weeks   Status Unable to assess     PT LONG TERM GOAL #3   Title pt will improve his FOTO score from 32% limitation to 25% limitation   Baseline 50 % limited today   Time 6   Period Weeks               Plan - 02/17/16 7408    Clinical Impression Statement Pt reporting less pain at end of session. pt reporting he feels like therapy is helping him. Pt instructed to use his RW for community ambulation and to bring to next visit to and possibly try amb with a straight cane for added balance and support. Pt has met his STG's and 1/3 of his LTG's.    PT Frequency 2x / week   PT Duration 6 weeks   PT Treatment/Interventions ADLs/Self Care Home Management;Cryotherapy;Electrical Stimulation;Iontophoresis 75m/ml Dexamethasone;Functional mobility training;Stair training;Gait training;Moist Heat;Therapeutic activities;Therapeutic exercise;Balance training;Neuromuscular re-education;Patient/family education;Passive range of motion;Manual techniques;Vasopneumatic Device   PT Next Visit Plan continue Nustep/Bike, bilateral knee strengthening exercises. Begin adding weights to LE exercises as pt tolerates.    PT Home Exercise Plan SLR, Hip abd in sidelying, mini-squats   Consulted and Agree with Plan of Care Patient      Patient will benefit from skilled therapeutic intervention in order to improve the following deficits and impairments:  Decreased mobility,  Decreased strength, Decreased balance, Decreased activity tolerance, Pain, Abnormal gait, Difficulty walking  Visit Diagnosis: Chronic pain of left knee  Chronic pain of right knee  Difficulty in walking, not elsewhere classified  Muscle weakness (generalized)     Problem List Patient Active Problem List   Diagnosis Date Noted  . Diabetes mellitus due to underlying condition with ketoacidosis without coma,  with long-term current use of insulin ([place]) 02/04/2016  . Dry skin 01/18/2016  . Peripheral vascular disease ([place]) 11/15/2015  . Alcohol abuse 09/02/2015  . Tobacco abuse 09/02/2015  . Tricompartment osteoarthritis of both knees 06/15/2015  . Chronic abdominal pain 03/14/2015  . Vitamin D deficiency 03/12/2015  . Hyperlipidemia 02/17/2015  . Fall 12/24/2014  . History of alcohol abuse 12/05/2014  . Protein-calorie malnutrition, severe 11/18/2014  . Cervical arthritis ([place]) 01/01/2014  . Chronic pancreatitis ([place]) 02/24/2013  . Health care maintenance 08/25/2011  . Chronic alcoholic pancreatitis ([place]) 04/20/2011  . Type 2 diabetes mellitus with peripheral neuropathy ([place]) 12/09/2010  . HTN (hypertension) 12/09/2010    Oretha Caprice, MPT  02/17/2016, 10:01 AM  [facility] [address] [place], [place], [address] Phone: [phone]   Fax:  [phone]  Name: Michael [name] MRN: [id number] Date of Birth: [birth year]

## 2016-02-17 NOTE — Telephone Encounter (Signed)
Pt requesting a refill on  Vitamin D, Ergocalciferol, (DRISDOL) 50000

## 2016-02-18 ENCOUNTER — Ambulatory Visit: Payer: Medicare Other

## 2016-02-18 NOTE — Telephone Encounter (Signed)
Pt called / informed Vit D med was refilled on 02/14/16 and sent to [facility]. Stated he will call [facility].

## 2016-02-22 ENCOUNTER — Ambulatory Visit: Payer: Medicare Other | Admitting: Podiatry

## 2016-02-22 ENCOUNTER — Ambulatory Visit: Payer: Medicare Other

## 2016-02-22 ENCOUNTER — Telehealth: Payer: Self-pay

## 2016-02-22 NOTE — Telephone Encounter (Signed)
Mr Oliva was called and message left for him to contact us about discharge or coming to next 2 appointments scheduled.

## 2016-02-24 ENCOUNTER — Telehealth: Payer: Self-pay | Admitting: Internal Medicine

## 2016-02-24 NOTE — Telephone Encounter (Signed)
APT. REMINDER CALL, LMTCB °

## 2016-02-25 ENCOUNTER — Ambulatory Visit (INDEPENDENT_AMBULATORY_CARE_PROVIDER_SITE_OTHER): Payer: Medicare Other | Admitting: Internal Medicine

## 2016-02-25 ENCOUNTER — Telehealth: Payer: Self-pay | Admitting: Pharmacist

## 2016-02-25 ENCOUNTER — Encounter: Payer: Self-pay | Admitting: Internal Medicine

## 2016-02-25 VITALS — BP 132/74 | HR 89 | Temp 98.3°F | Ht 64.0 in | Wt 152.5 lb

## 2016-02-25 DIAGNOSIS — E1142 Type 2 diabetes mellitus with diabetic polyneuropathy: Secondary | ICD-10-CM | POA: Diagnosis not present

## 2016-02-25 DIAGNOSIS — Z794 Long term (current) use of insulin: Secondary | ICD-10-CM

## 2016-02-25 DIAGNOSIS — E1165 Type 2 diabetes mellitus with hyperglycemia: Secondary | ICD-10-CM | POA: Diagnosis not present

## 2016-02-25 DIAGNOSIS — M170 Bilateral primary osteoarthritis of knee: Secondary | ICD-10-CM | POA: Diagnosis not present

## 2016-02-25 LAB — GLUCOSE, CAPILLARY: GLUCOSE-CAPILLARY: 201 mg/dL — AB (ref 65–99)

## 2016-02-25 MED ORDER — INSULIN ASPART 100 UNIT/ML FLEXPEN: PEN_INJECTOR | SUBCUTANEOUS | 11 refills | Status: DC

## 2016-02-25 MED ORDER — DULOXETINE HCL 60 MG PO CPEP: 60.0000 mg | ORAL_CAPSULE | Freq: Every day | ORAL | 2 refills | Status: DC

## 2016-02-25 NOTE — Progress Notes (Signed)
   CC: Follow up for diabetes and arthritis  HPI:  Mr.Deklin L Uscanga is a 61 y.o. man here for follow up of his chronic diffuse pains from arthritis and diabetic neuropathy.   See problem based assessment and plan below for additional details  Past Medical History:  Diagnosis Date  . Diabetes mellitus without complication ([place])   . Pancreatitis   . Seizures ([place])     Review of Systems:  Review of Systems  Constitutional: Negative for fever.  Eyes: Negative for blurred vision.  Cardiovascular: Negative for chest pain.  Musculoskeletal: Positive for joint pain.  Skin: Negative for rash.  Neurological: Negative for dizziness.  Psychiatric/Behavioral: The patient is nervous/anxious and has insomnia.     Physical Exam: Physical Exam  Constitutional:  Thin, chronically ill appearing man  HENT:  Head: Normocephalic and atraumatic.  Cardiovascular: Normal rate and regular rhythm.   Pulmonary/Chest: Effort normal and breath sounds normal.  Musculoskeletal: He exhibits no edema.  Bony deformity of left knee with intact ROM, crepitus bilaterally  Psychiatric:  Pressured speech    Vitals:   02/25/16 1512  BP: 132/74  Pulse: 89  Temp: 98.3 F (36.8 C)  TempSrc: Oral  SpO2: 100%  Weight: 152 lb 8 oz (69.2 kg)  Height: 5\' 4"  (1.626 m)    Assessment & Plan:   See Encounters Tab for problem based charting.  Patient discussed with Dr. Dareen Piano

## 2016-02-25 NOTE — Progress Notes (Signed)
Contacted [facility] to clarify which insulin will be covered for patient, they state they do not have patient's current insurance card. I called patient to inform him of this---he will need to present his new card to the pharmacy. Advised patient to contact clinic if insulin is not covered. Patient verbalized understanding.

## 2016-02-25 NOTE — Patient Instructions (Addendum)
I'm sorry to hear you are still having so much trouble with your arthritis Michael [name]. I am not sure what the next steps likely to help you are going to be so I think we might benefit with referring you to a pain specialist. You can either get a new appointment with the practice you have previously seen in 2017 or we can refer you to another group in the area.  For your diabetes I think getting you onto a short acting insulin as well will improve these values and help reduce your neuropathy pain. I have worked to fix the prescription for this [employer].

## 2016-02-27 ENCOUNTER — Telehealth: Payer: Self-pay | Admitting: Internal Medicine

## 2016-02-27 ENCOUNTER — Other Ambulatory Visit: Payer: Self-pay | Admitting: Internal Medicine

## 2016-02-27 MED ORDER — IBUPROFEN 800 MG PO TABS: 800.0000 mg | ORAL_TABLET | Freq: Three times a day (TID) | ORAL | 0 refills | Status: DC | PRN

## 2016-02-27 NOTE — Telephone Encounter (Signed)
   Reason for call:   I received a call from [name] at 27 AM indicating he needs a refill of his Ibuprofen for his chronic arthritis pain.   Pertinent Data:   Requesting medication refills  Appears to have called in several times in the past month; 1/1, 1/21 and again today requesting refills for ibuprofen.   No follow up appointments scheduled and was just seen by his PCP, Dr. Benjamine Mola, on 2/2   Assessment / Plan / Recommendations:   Refill ibuprofen today, will route message to PCP for further mediation refills and management of his arthritis pain  Advised to schedule follow up appointment in clinic  As always, pt is advised that if symptoms worsen or new symptoms arise, they should go to an urgent care facility or to to ER for further evaluation.   Maryellen Pile, MD   02/27/2016, 10:38 AM

## 2016-02-28 NOTE — Assessment & Plan Note (Signed)
HPI: Michael [name] is reporting little improvement in his pain with physical therapy so far. PT documentation suggest some benefit in his functional capacity so far. Besides this treatments have not given a large benefit in his chronic knee pains. He has previously seen   A: Chronic OA disease of both knees not well controlled on current medications I am not sure what else we can really offer Michael [name] at this time. He is not interested in intraarticular injection and feels PT is not doing a lot. He is taking oral NSAIDs very frequently already. Topical therapies are not giving a large benefit either. He is a very poor candidate for any opioid type pain treatments. His best option is probably getting back in with his previous pain specialty clinic for evaluation and further recommendations.  P: -Referral to pain clinic

## 2016-02-28 NOTE — Progress Notes (Signed)
Internal Medicine Clinic Attending  Case discussed with Dr. Rice at the time of the visit.  We reviewed the resident's history and exam and pertinent patient test results.  I agree with the assessment, diagnosis, and plan of care documented in the resident's note.  

## 2016-02-28 NOTE — Assessment & Plan Note (Signed)
HPI: Unfortunately Michael [name] has not started evening meal coverage with the 8 units insulin as recommended reporting difficulty getting the prescription filled. He did not contact us regarding this until now. As a result there is been no significant change in his diabetes control and he describes blood sugars typically in the high 100s or 200s when checking. He continues to have significant pain and mild paresthesias in his hands and feet. He thinks the duloxetine 60 mg a day may be helping his symptoms of the and thinks this is less sedating than the Elavil he used to take.  A: Uncontrolled type 2 diabetes with hemoglobin A1c goal less than 7.0% Insulin aspart inaccessible until changed to fit patient insurance formulary  P: -Changed prescription to Novolog 8units daily before supper/largest meal -Continue duloxetine 60mg  daily along with gabapentin for neuropathic pain

## 2016-02-29 ENCOUNTER — Ambulatory Visit: Payer: Medicare HMO | Admitting: Physical Therapy

## 2016-02-29 ENCOUNTER — Telehealth: Payer: Self-pay | Admitting: Internal Medicine

## 2016-02-29 NOTE — Telephone Encounter (Signed)
Pt called on call pager today at 8:20pm. He would like all of his prescription faxed to [facility]. Fax number [phone]. Pharmacy [phone]. Thanks!  Beverlee Nims

## 2016-03-01 ENCOUNTER — Other Ambulatory Visit: Payer: Self-pay

## 2016-03-01 ENCOUNTER — Encounter: Payer: Self-pay | Admitting: Physical Therapy

## 2016-03-01 ENCOUNTER — Ambulatory Visit: Payer: Medicare HMO | Attending: Internal Medicine | Admitting: Physical Therapy

## 2016-03-01 DIAGNOSIS — M25562 Pain in left knee: Secondary | ICD-10-CM | POA: Diagnosis not present

## 2016-03-01 DIAGNOSIS — G8929 Other chronic pain: Secondary | ICD-10-CM | POA: Diagnosis not present

## 2016-03-01 DIAGNOSIS — M6281 Muscle weakness (generalized): Secondary | ICD-10-CM | POA: Insufficient documentation

## 2016-03-01 DIAGNOSIS — R262 Difficulty in walking, not elsewhere classified: Secondary | ICD-10-CM | POA: Diagnosis not present

## 2016-03-01 DIAGNOSIS — K860 Alcohol-induced chronic pancreatitis: Secondary | ICD-10-CM

## 2016-03-01 DIAGNOSIS — E0810 Diabetes mellitus due to underlying condition with ketoacidosis without coma: Secondary | ICD-10-CM

## 2016-03-01 DIAGNOSIS — M25561 Pain in right knee: Secondary | ICD-10-CM | POA: Insufficient documentation

## 2016-03-01 DIAGNOSIS — Z794 Long term (current) use of insulin: Secondary | ICD-10-CM

## 2016-03-01 DIAGNOSIS — E1142 Type 2 diabetes mellitus with diabetic polyneuropathy: Secondary | ICD-10-CM

## 2016-03-01 NOTE — Therapy (Signed)
[place], [place], [address] Phone: [phone]   Fax:  [phone]  Physical Therapy Treatment  Patient Details  Name: Michael [name] MRN: [id number] Date of Birth: 07-May-1955 Referring Provider: Vernelle Emerald, MD  Encounter Date: 03/01/2016      PT End of Session - 03/01/16 0930    Visit Number 7   Number of Visits 12   Date for PT Re-Evaluation 03/17/16   Authorization Type kx modifier 15 visit   PT Start Time 0931   PT Stop Time 1011   PT Time Calculation (min) 40 min   Activity Tolerance Patient tolerated treatment well   Behavior During Therapy [facility] for tasks assessed/performed      Past Medical History:  Diagnosis Date  . Diabetes mellitus without complication ([place])   . Pancreatitis   . Seizures ([place])     Past Surgical History:  Procedure Laterality Date  . BALLOON DILATION  03/22/2011   Procedure: BALLOON DILATION;  Surgeon: Gatha Mayer, MD;  [facility];  Service: Endoscopy;  Laterality: N/A;  . COLONOSCOPY N/A 05/22/2012   Procedure: COLONOSCOPY;  Surgeon: Gatha Mayer, MD;  Location: [place];  Service: Endoscopy;  Laterality: N/A;  . COLONOSCOPY W/ BIOPSIES AND POLYPECTOMY  09/12/11  . ESOPHAGOGASTRODUODENOSCOPY  03/22/2011   Procedure: ESOPHAGOGASTRODUODENOSCOPY (EGD);  Surgeon: Gatha Mayer, MD;  Location[facility];  Service: Endoscopy;  Laterality: N/A;  egd with balloon   . EUS  04/20/2011   Procedure: UPPER ENDOSCOPIC ULTRASOUND (EUS) LINEAR;  Surgeon: Milus Banister, MD;  [facility];  Service: Endoscopy;  Laterality: N/A;    There were no vitals filed for this visit.      Subjective Assessment - 03/01/16 0931    Subjective Both knees "snap and crack all the time"   PT visits and exercises seem to help, swelling is decreased. Legs feel like they are getting stronger but still can't move the way he used to. L feels weaker than R.    Pertinent History seizures   Patient Stated Goals be able to walk without pain   Currently in Pain? Yes   Pain Score 3    Pain Location Knee   Pain Orientation Right;Left   Pain Descriptors / Indicators Sharp;Aching   Aggravating Factors  standing from chair.    Pain Relieving Factors exercises            [facility] PT Assessment - 03/01/16 0001      Assessment   Medical Diagnosis bilateral knee pain   Referring Provider Vernelle Emerald, MD                     [facility] Adult PT Treatment/Exercise - 03/01/16 0001      Knee/Hip Exercises: Aerobic   Nustep L5 8 min     Knee/Hip Exercises: Standing   Gait Training with cane     Knee/Hip Exercises: Supine   Straight Leg Raises Both;20 reps     Knee/Hip Exercises: Sidelying   Hip ABduction Both;20 reps                PT Education - 03/01/16 0950    Education provided Yes   Education Details progress, POC, discussed use of cane & avoiding placing weight through it, fitting cane   Person(s) Educated Patient   Methods Explanation   Comprehension Verbalized understanding;Need further instruction          PT Short Term Goals -  02/17/16 0931      PT SHORT TERM GOAL #1   Title Pt will be independent in his HEP   Baseline minor cues   Period Weeks   Status Achieved           PT Long Term Goals - 03-16-2016 [id number]      PT LONG TERM GOAL #1   Title Pt will improve bilateral knee flexion/extension strength to 5/5 in order to improve functional mobility an gait.    Status Achieved     PT LONG TERM GOAL #2   Title pt will be able to amb 30 minutes with pain less than 3/10 in bilateral knees.    Baseline 5-6/10 after walking grocery store   Status On-going     PT LONG TERM GOAL #3   Title pt will improve his FOTO score from 32% limitation to 25% limitation   Baseline 57% ability on March 16, 2022   Status On-going     PT LONG TERM GOAL #4   Title Independent with long term HEP for continued strengthening  following d/c   Baseline will progress as tol   Time 2   Period Weeks   Status New     PT LONG TERM GOAL #5   Title Decreased sensation of L patellar movement indicating improved LE strength and stability   Baseline significant sensation esp when fatigued.    Time 2   Period Weeks   Status New               Plan - 03/16/16 1006    Clinical Impression Statement discussed POC and attendance through last 2 weeks of PT. Pt was agreeable and verbalized readiness for d/c at end of POC. Pt will benefit from skilled PT to train gait with AD and establish long term HEP.    PT Next Visit Plan bike/nustep, hip/LE strength & endurance   PT Home Exercise Plan SLR, Hip abd in sidelying, mini-squats   Consulted and Agree with Plan of Care Patient      Patient will benefit from skilled therapeutic intervention in order to improve the following deficits and impairments:     Visit Diagnosis: Chronic pain of left knee - Plan: PT plan of care cert/re-cert  Chronic pain of right knee - Plan: PT plan of care cert/re-cert  Difficulty in walking, not elsewhere classified - Plan: PT plan of care cert/re-cert  Muscle weakness (generalized) - Plan: PT plan of care cert/re-cert       G-Codes - 03-16-16 1027    Functional Assessment Tool Used FOTO 57% ability (goal 71%)   Functional Limitation Mobility: Walking and moving around   Mobility: Walking and Moving Around Current Status [id number]) At least 40 percent but less than 60 percent impaired, limited or restricted   Mobility: Walking and Moving Around Goal Status [id number]) At least 20 percent but less than 40 percent impaired, limited or restricted      Problem List Patient Active Problem List   Diagnosis Date Noted  . Diabetes mellitus due to underlying condition with ketoacidosis without coma, with long-term current use of insulin ([place]) 02/04/2016  . Dry skin 01/18/2016  . Peripheral vascular disease ([place]) 11/15/2015  . Alcohol abuse  09/02/2015  . Tobacco abuse 09/02/2015  . Tricompartment osteoarthritis of both knees 06/15/2015  . Chronic abdominal pain 03/14/2015  . Vitamin D deficiency 03/12/2015  . Hyperlipidemia 02/17/2015  . Fall 12/24/2014  . History of alcohol abuse 12/05/2014  . Protein-calorie malnutrition, severe 11/18/2014  .  Cervical arthritis ([place]) 01/01/2014  . Health care maintenance 08/25/2011  . Chronic alcoholic pancreatitis ([place]) 04/20/2011  . Type 2 diabetes mellitus with peripheral neuropathy ([place]) 12/09/2010  . HTN (hypertension) 12/09/2010    Laderrick Wilk C. Tylar Amborn PT, DPT 03/01/16 10:56 AM   [facility] [address] [place], [place], [address] Phone: [phone]   Fax:  [phone]  Name: Michael [name] MRN: [id number] Date of Birth: 03-22-1955

## 2016-03-02 MED ORDER — ACCU-CHEK FASTCLIX LANCETS MISC: 5 refills | Status: DC

## 2016-03-02 MED ORDER — DULOXETINE HCL 60 MG PO CPEP: 60.0000 mg | ORAL_CAPSULE | Freq: Every day | ORAL | 2 refills | Status: DC

## 2016-03-02 MED ORDER — INSULIN ASPART 100 UNIT/ML FLEXPEN: PEN_INJECTOR | SUBCUTANEOUS | 11 refills | Status: DC

## 2016-03-02 MED ORDER — FOLIC ACID 1 MG PO TABS: 1.0000 mg | ORAL_TABLET | Freq: Every day | ORAL | 1 refills | Status: DC

## 2016-03-02 MED ORDER — PANCRELIPASE (LIP-PROT-AMYL) 36000-114000 UNITS PO CPEP: ORAL_CAPSULE | ORAL | 1 refills | Status: DC

## 2016-03-02 MED ORDER — PANTOPRAZOLE SODIUM 40 MG PO TBEC: 40.0000 mg | DELAYED_RELEASE_TABLET | Freq: Every day | ORAL | 1 refills | Status: DC

## 2016-03-02 MED ORDER — IBUPROFEN 800 MG PO TABS: 800.0000 mg | ORAL_TABLET | Freq: Three times a day (TID) | ORAL | 2 refills | Status: DC | PRN

## 2016-03-02 MED ORDER — LOVASTATIN 40 MG PO TABS: 40.0000 mg | ORAL_TABLET | Freq: Every day | ORAL | 1 refills | Status: DC

## 2016-03-02 MED ORDER — GLUCOSE BLOOD VI STRP: ORAL_STRIP | 1 refills | Status: DC

## 2016-03-02 NOTE — Telephone Encounter (Signed)
Please call these in to pharmacy, thanks.  Doesn't need lidocaine and vitamin D refilled at this time.

## 2016-03-03 ENCOUNTER — Other Ambulatory Visit: Payer: Self-pay | Admitting: *Deleted

## 2016-03-03 ENCOUNTER — Telehealth: Payer: Self-pay

## 2016-03-03 DIAGNOSIS — E0810 Diabetes mellitus due to underlying condition with ketoacidosis without coma: Secondary | ICD-10-CM

## 2016-03-03 DIAGNOSIS — E1142 Type 2 diabetes mellitus with diabetic polyneuropathy: Secondary | ICD-10-CM

## 2016-03-03 DIAGNOSIS — Z794 Long term (current) use of insulin: Principal | ICD-10-CM

## 2016-03-03 MED ORDER — LOPERAMIDE HCL 2 MG PO CAPS: ORAL_CAPSULE | ORAL | 2 refills | Status: DC

## 2016-03-03 NOTE — Telephone Encounter (Signed)
Needs to speak with a nurse regarding meds. Please call back.  

## 2016-03-03 NOTE — Telephone Encounter (Signed)
Can you resend me the original prescriptions I tried to order that are not eligible for Phone In so I can change these? I'm not sure which ones those are in this case.

## 2016-03-03 NOTE — Telephone Encounter (Signed)
Dr rice, we can not phone test strips or any diab supplies in, each item must be sent electronically or faxed for these, they must specifically state the times used daily, diag code and whether insulin dependent or not. Sorry but [employer] requires this also most insurance companies now require this

## 2016-03-03 NOTE — Telephone Encounter (Signed)
Lm for rtc 

## 2016-03-03 NOTE — Telephone Encounter (Signed)
[employer] aware of refills.

## 2016-03-06 MED ORDER — GLUCOSE BLOOD VI STRP: ORAL_STRIP | 1 refills | Status: DC

## 2016-03-06 MED ORDER — ACCU-CHEK SOFTCLIX LANCET DEV MISC: 5 refills | Status: DC

## 2016-03-06 NOTE — Telephone Encounter (Signed)
Cymbalta (they will send a 90 day supply), folic acid,ibuprofen, lipase (only send 200 caps at one time) , protonix, lovastatin, and loperamide rxs called to [facility].

## 2016-03-07 ENCOUNTER — Other Ambulatory Visit: Payer: Self-pay

## 2016-03-07 NOTE — Telephone Encounter (Signed)
Call from [facility] refills on pt's test strips, lancets, and novolog flexpen.  All have already been approved by pcp, verbal authorization given over the phone.  Phone call complete.Despina Hidden Cassady2/13/20188:49 AM

## 2016-03-08 ENCOUNTER — Ambulatory Visit: Payer: Medicare HMO | Admitting: Physical Therapy

## 2016-03-09 ENCOUNTER — Other Ambulatory Visit: Payer: Self-pay | Admitting: *Deleted

## 2016-03-10 ENCOUNTER — Ambulatory Visit: Payer: Medicare HMO | Admitting: Physical Therapy

## 2016-03-10 ENCOUNTER — Ambulatory Visit: Payer: Medicare Other

## 2016-03-10 ENCOUNTER — Encounter: Payer: Self-pay | Admitting: Physical Therapy

## 2016-03-10 DIAGNOSIS — M25561 Pain in right knee: Secondary | ICD-10-CM | POA: Diagnosis not present

## 2016-03-10 DIAGNOSIS — M6281 Muscle weakness (generalized): Secondary | ICD-10-CM | POA: Diagnosis not present

## 2016-03-10 DIAGNOSIS — G8929 Other chronic pain: Secondary | ICD-10-CM

## 2016-03-10 DIAGNOSIS — R262 Difficulty in walking, not elsewhere classified: Secondary | ICD-10-CM

## 2016-03-10 DIAGNOSIS — R6889 Other general symptoms and signs: Secondary | ICD-10-CM | POA: Diagnosis not present

## 2016-03-10 DIAGNOSIS — M25562 Pain in left knee: Principal | ICD-10-CM

## 2016-03-10 MED ORDER — GABAPENTIN 300 MG PO CAPS: ORAL_CAPSULE | ORAL | 1 refills | Status: DC

## 2016-03-10 NOTE — Therapy (Signed)
[place], [place], [address] Phone: [phone]   Fax:  [phone]  Physical Therapy Treatment  Patient Details  Name: Michael [name] MRN: [id number] Date of Birth: 29-Dec-1955 Referring Provider: Vernelle Emerald, MD  Encounter Date: 03/10/2016      PT End of Session - 03/10/16 1054    Visit Number 8   Number of Visits 12   Date for PT Re-Evaluation 03/17/16   Authorization Type kx modifier 15 visit   PT Start Time 1015   PT Stop Time 1054   PT Time Calculation (min) 39 min   Activity Tolerance Other (comment)   Behavior During Therapy Agitated      Past Medical History:  Diagnosis Date  . Diabetes mellitus without complication ([place])   . Pancreatitis   . Seizures ([place])     Past Surgical History:  Procedure Laterality Date  . BALLOON DILATION  03/22/2011   Procedure: BALLOON DILATION;  Surgeon: Gatha Mayer, MD;  [facility];  Service: Endoscopy;  Laterality: N/A;  . COLONOSCOPY N/A 05/22/2012   Procedure: COLONOSCOPY;  Surgeon: Gatha Mayer, MD;  Location: [facility];  Service: Endoscopy;  Laterality: N/A;  . COLONOSCOPY W/ BIOPSIES AND POLYPECTOMY  09/12/11  . ESOPHAGOGASTRODUODENOSCOPY  03/22/2011   Procedure: ESOPHAGOGASTRODUODENOSCOPY (EGD);  Surgeon: Gatha Mayer, MD;  Location: [facility];  Service: Endoscopy;  Laterality: N/A;  egd with balloon   . EUS  04/20/2011   Procedure: UPPER ENDOSCOPIC ULTRASOUND (EUS) LINEAR;  Surgeon: Milus Banister, MD;  [facility];  Service: Endoscopy;  Laterality: N/A;    There were no vitals filed for this visit.      Subjective Assessment - 03/10/16 1014    Subjective pt reports both ankles, knees, hips, shoulders and hands hurt. Reports exercise won't help, is going to visit MD today for pain meds. Twisting and turning all night due to pain. Pt denies ever seeing a rheumatologist.    Currently in Pain? Yes   Pain  Radiating Towards both ankles, knees, shoulders, hands   Aggravating Factors  severe pain all the time.                          [facility] Adult PT Treatment/Exercise - 03/10/16 0001      Exercises   Exercises Other Exercises   Other Exercises  pulleys flexion 5 min, seated trunk twist, thoracic extension at chair     Knee/Hip Exercises: Aerobic   Nustep L3 15 min                PT Education - 03/10/16 1031    Education provided Yes   Education Details gentle movement for decreased pain, discussing sensations without using words "pain or hurt", MD is helping-reduce frustration, possible rheumatology visit, sleeping surface   Person(s) Educated Patient   Methods Explanation   Comprehension Verbalized understanding          PT Short Term Goals - 02/17/16 0931      PT SHORT TERM GOAL #1   Title Pt will be independent in his HEP   Baseline minor cues   Period Weeks   Status Achieved           PT Long Term Goals - 03/01/16 0943      PT LONG TERM GOAL #1   Title Pt will improve bilateral knee flexion/extension strength to 5/5 in order to improve functional mobility an  gait.    Status Achieved     PT LONG TERM GOAL #2   Title pt will be able to amb 30 minutes with pain less than 3/10 in bilateral knees.    Baseline 5-6/10 after walking grocery store   Status On-going     PT LONG TERM GOAL #3   Title pt will improve his FOTO score from 32% limitation to 25% limitation   Baseline 57% ability on 2/7   Status On-going     PT LONG TERM GOAL #4   Title Independent with long term HEP for continued strengthening following d/c   Baseline will progress as tol   Time 2   Period Weeks   Status New     PT LONG TERM GOAL #5   Title Decreased sensation of L patellar movement indicating improved LE strength and stability   Baseline significant sensation esp when fatigued.    Time 2   Period Weeks   Status New               Plan - 03/10/16  1035    Clinical Impression Statement Pt arrived to PT today very frustrated with health care and feeling frustrated by pain. Utilized gentle exercises to mobilize painful joints. Discussed with pt that MDs are helping and pain is very difficult to treat. Asked pt to consider describing pain wihtout "hurt" or "pain"   Discussed importance of gentle movement throughout the day. Pt has not been to a rheumatologist. Discussed importance of monitoring blood sugard- possible visit to nutrition. Pt seemed more relaxed following discussions today.    PT Next Visit Plan bike/nustep, hip/LE strength & endurance   PT Home Exercise Plan SLR, Hip abd in sidelying, mini-squats   Consulted and Agree with Plan of Care Patient      Patient will benefit from skilled therapeutic intervention in order to improve the following deficits and impairments:     Visit Diagnosis: Chronic pain of left knee  Chronic pain of right knee  Difficulty in walking, not elsewhere classified  Muscle weakness (generalized)     Problem List Patient Active Problem List   Diagnosis Date Noted  . Diabetes mellitus due to underlying condition with ketoacidosis without coma, with long-term current use of insulin ([place]) 02/04/2016  . Dry skin 01/18/2016  . Peripheral vascular disease ([place]) 11/15/2015  . Alcohol abuse 09/02/2015  . Tobacco abuse 09/02/2015  . Tricompartment osteoarthritis of both knees 06/15/2015  . Chronic abdominal pain 03/14/2015  . Vitamin D deficiency 03/12/2015  . Hyperlipidemia 02/17/2015  . Fall 12/24/2014  . History of alcohol abuse 12/05/2014  . Protein-calorie malnutrition, severe 11/18/2014  . Cervical arthritis ([place]) 01/01/2014  . Health care maintenance 08/25/2011  . Chronic alcoholic pancreatitis ([place]) 04/20/2011  . Type 2 diabetes mellitus with peripheral neuropathy ([place]) 12/09/2010  . HTN (hypertension) 12/09/2010    Ly Wass C. Jazline Cumbee PT, DPT 03/10/16 10:57 AM   [facility] [address] [place], [place], [address] Phone: [phone]   Fax:  [phone]  Name: Michael [name] MRN: [id number] Date of Birth: [birth year]

## 2016-03-15 ENCOUNTER — Ambulatory Visit: Payer: Medicare HMO | Admitting: Physical Therapy

## 2016-03-15 DIAGNOSIS — M25561 Pain in right knee: Secondary | ICD-10-CM

## 2016-03-15 DIAGNOSIS — M6281 Muscle weakness (generalized): Secondary | ICD-10-CM | POA: Diagnosis not present

## 2016-03-15 DIAGNOSIS — R6889 Other general symptoms and signs: Secondary | ICD-10-CM | POA: Diagnosis not present

## 2016-03-15 DIAGNOSIS — M25562 Pain in left knee: Secondary | ICD-10-CM | POA: Diagnosis not present

## 2016-03-15 DIAGNOSIS — G8929 Other chronic pain: Secondary | ICD-10-CM | POA: Diagnosis not present

## 2016-03-15 DIAGNOSIS — R262 Difficulty in walking, not elsewhere classified: Secondary | ICD-10-CM

## 2016-03-15 NOTE — Therapy (Signed)
[place], [place], [address] Phone: [phone]   Fax:  [phone]  Physical Therapy Treatment  Patient Details  Name: Michael [name] MRN: [id number] Date of Birth: [birth year] Referring Provider: Vernelle Emerald, MD  Encounter Date: 03/15/2016      PT End of Session - 03/15/16 0959    Visit Number 9   Number of Visits 12   Date for PT Re-Evaluation 03/17/16   Authorization Type kx modifier 15 visit   PT Start Time 0953   PT Stop Time 1035   PT Time Calculation (min) 42 min      Past Medical History:  Diagnosis Date  . Diabetes mellitus without complication ([place])   . Pancreatitis   . Seizures ([place])     Past Surgical History:  Procedure Laterality Date  . BALLOON DILATION  03/22/2011   Procedure: BALLOON DILATION;  Surgeon: Gatha Mayer, MD;  [facility];  Service: Endoscopy;  Laterality: N/A;  . COLONOSCOPY N/A 05/22/2012   Procedure: COLONOSCOPY;  Surgeon: Gatha Mayer, MD;  Location: [facility];  Service: Endoscopy;  Laterality: N/A;  . COLONOSCOPY W/ BIOPSIES AND POLYPECTOMY  09/12/11  . ESOPHAGOGASTRODUODENOSCOPY  03/22/2011   Procedure: ESOPHAGOGASTRODUODENOSCOPY (EGD);  Surgeon: Gatha Mayer, MD;  [facility]: [facility];  Service: Endoscopy;  Laterality: N/A;  egd with balloon   . EUS  04/20/2011   Procedure: UPPER ENDOSCOPIC ULTRASOUND (EUS) LINEAR;  Surgeon: Milus Banister, MD;  [facility];  Service: Endoscopy;  Laterality: N/A;    There were no vitals filed for this visit.      Subjective Assessment - 03/15/16 0957    Subjective I have gained strength since I have been coming here.    Currently in Pain? No/denies   Pain Score --  up to 8/10 left knee   Pain Relieving Factors standing and walking   Effect of Pain on Daily Activities sitting, muscle rub                         [facility] Adult PT Treatment/Exercise - 03/15/16 0001       Knee/Hip Exercises: Aerobic   Nustep L5 x 10 minutes      Knee/Hip Exercises: Machines for Strengthening   Cybex Leg Press 35# 20 reps      Knee/Hip Exercises: Standing   Heel Raises 15 reps   Heel Raises Limitations UE support   Hip Abduction 15 reps   Abduction Limitations UE support   Hip Extension 15 reps   Extension Limitations UE support   Forward Step Up 10 reps   Forward Step Up Limitations UE support, verbal cues for sequencing   Other Standing Knee Exercises hamstring curls 15 reps      Knee/Hip Exercises: Seated   Long Arc Quad Right;20 reps   Long Arc Quad Weight 5 lbs.  5 second holds     Knee/Hip Exercises: Supine   Straight Leg Raises Both;20 reps                  PT Short Term Goals - 02/17/16 0931      PT SHORT TERM GOAL #1   Title Pt will be independent in his HEP   Baseline minor cues   Period Weeks   Status Achieved           PT Long Term Goals - 03/01/16 [id number]      PT LONG TERM GOAL #  1   Title Pt will improve bilateral knee flexion/extension strength to 5/5 in order to improve functional mobility an gait.    Status Achieved     PT LONG TERM GOAL #2   Title pt will be able to amb 30 minutes with pain less than 3/10 in bilateral knees.    Baseline 5-6/10 after walking grocery store   Status On-going     PT LONG TERM GOAL #3   Title pt will improve his FOTO score from 32% limitation to 25% limitation   Baseline 57% ability on 2/7   Status On-going     PT LONG TERM GOAL #4   Title Independent with long term HEP for continued strengthening following d/c   Baseline will progress as tol   Time 2   Period Weeks   Status New     PT LONG TERM GOAL #5   Title Decreased sensation of L patellar movement indicating improved LE strength and stability   Baseline significant sensation esp when fatigued.    Time 2   Period Weeks   Status New               Plan - 03/15/16 1001    Clinical Impression Statement Pt arrives in  better spirits today and reports he felt better after last treatment. He is interested in asking his MD for PT evaluation for shoulder strengthening. Focused LE strengthening with good tolerance. Plan to discharge next visit.    PT Next Visit Plan review and discharge/goal check    PT Home Exercise Plan SLR, Hip abd in sidelying, mini-squats   Consulted and Agree with Plan of Care Patient      Patient will benefit from skilled therapeutic intervention in order to improve the following deficits and impairments:  Decreased mobility, Decreased strength, Decreased balance, Decreased activity tolerance, Pain, Abnormal gait, Difficulty walking  Visit Diagnosis: Chronic pain of left knee  Chronic pain of right knee  Difficulty in walking, not elsewhere classified  Muscle weakness (generalized)     Problem List Patient Active Problem List   Diagnosis Date Noted  . Diabetes mellitus due to underlying condition with ketoacidosis without coma, with long-term current use of insulin ([place]) 02/04/2016  . Dry skin 01/18/2016  . Peripheral vascular disease ([place]) 11/15/2015  . Alcohol abuse 09/02/2015  . Tobacco abuse 09/02/2015  . Tricompartment osteoarthritis of both knees 06/15/2015  . Chronic abdominal pain 03/14/2015  . Vitamin D deficiency 03/12/2015  . Hyperlipidemia 02/17/2015  . Fall 12/24/2014  . History of alcohol abuse 12/05/2014  . Protein-calorie malnutrition, severe 11/18/2014  . Cervical arthritis ([place]) 01/01/2014  . Health care maintenance 08/25/2011  . Chronic alcoholic pancreatitis ([place]) 04/20/2011  . Type 2 diabetes mellitus with peripheral neuropathy ([place]) 12/09/2010  . HTN (hypertension) 12/09/2010    Dorene Ar, PTA 03/15/2016, 10:36 AM  [facility] [address] [place], [place], [address] Phone: [phone]   Fax:  [phone]  Name: JAMASON LANDIS MRN: [id number] Date of Birth: [birth year]

## 2016-03-16 ENCOUNTER — Telehealth: Payer: Self-pay | Admitting: Internal Medicine

## 2016-03-16 NOTE — Telephone Encounter (Signed)
APT. REMINDER CALL, LMTCB °

## 2016-03-17 ENCOUNTER — Ambulatory Visit (INDEPENDENT_AMBULATORY_CARE_PROVIDER_SITE_OTHER): Payer: Medicare HMO | Admitting: Internal Medicine

## 2016-03-17 ENCOUNTER — Encounter: Payer: Self-pay | Admitting: Internal Medicine

## 2016-03-17 ENCOUNTER — Ambulatory Visit: Payer: Medicare HMO | Admitting: Physical Therapy

## 2016-03-17 VITALS — BP 145/91 | HR 85 | Temp 97.9°F | Ht 64.0 in | Wt 153.7 lb

## 2016-03-17 DIAGNOSIS — L853 Xerosis cutis: Secondary | ICD-10-CM

## 2016-03-17 DIAGNOSIS — L989 Disorder of the skin and subcutaneous tissue, unspecified: Secondary | ICD-10-CM

## 2016-03-17 DIAGNOSIS — Z794 Long term (current) use of insulin: Secondary | ICD-10-CM | POA: Diagnosis not present

## 2016-03-17 DIAGNOSIS — L299 Pruritus, unspecified: Secondary | ICD-10-CM | POA: Diagnosis not present

## 2016-03-17 DIAGNOSIS — E1142 Type 2 diabetes mellitus with diabetic polyneuropathy: Secondary | ICD-10-CM | POA: Diagnosis not present

## 2016-03-17 DIAGNOSIS — E1165 Type 2 diabetes mellitus with hyperglycemia: Secondary | ICD-10-CM

## 2016-03-17 DIAGNOSIS — R6889 Other general symptoms and signs: Secondary | ICD-10-CM | POA: Diagnosis not present

## 2016-03-17 MED ORDER — HYDROXYZINE HCL 10 MG PO TABS: 10.0000 mg | ORAL_TABLET | Freq: Three times a day (TID) | ORAL | 1 refills | Status: DC | PRN

## 2016-03-17 MED ORDER — INSULIN DETEMIR 100 UNIT/ML FLEXPEN: 32.0000 [IU] | PEN_INJECTOR | Freq: Every morning | SUBCUTANEOUS | 1 refills | Status: DC

## 2016-03-17 NOTE — Progress Notes (Signed)
   CC: Follow up for diabetes management  HPI:  Michael [name] is a 61 y.o. man here for one month follow up of his diabetes after medication changes.   See problem based assessment and plan below for additional details  Past Medical History:  Diagnosis Date  . Diabetes mellitus without complication ([place])   . Pancreatitis   . Seizures ([place])     Review of Systems:  Review of Systems  Eyes: Negative for blurred vision.  Respiratory: Negative for shortness of breath.   Cardiovascular: Negative for leg swelling.  Musculoskeletal: Positive for back pain, joint pain and neck pain.  Skin: Negative for rash.  Neurological: Negative for sensory change.  Psychiatric/Behavioral: The patient has insomnia.     Physical Exam: Physical Exam  Constitutional: He is well-developed, well-nourished, and in no distress. No distress.  Cardiovascular: Normal rate and regular rhythm.   Pulmonary/Chest: Effort normal and breath sounds normal.  Musculoskeletal: He exhibits no edema.  Skin: Skin is warm and dry. No rash noted.  Psychiatric:  Pressured speech    Vitals:   03/17/16 1340 03/17/16 1441  BP: (!) 143/77 (!) 145/91  Pulse: 90 85  Temp: 97.9 F (36.6 C)   TempSrc: Oral   SpO2: 100%   Weight: 153 lb 11.2 oz (69.7 kg)   Height: 5\' 4"  (1.626 m)     Assessment & Plan:   See Encounters Tab for problem based charting.  Patient discussed with Dr. Angelia Mould

## 2016-03-17 NOTE — Patient Instructions (Addendum)
It was a pleasure to see you today Mr. Kirn.  I recommend moving your dose of Novolog insulin to earlier in the evening, before dinner time if possible. This should allow your blood sugars to be more level during the day. You can also increase the dose from 8 units to 10 units.  I sent a new prescription for hydroxyzine 10mg  to take up to 3 times daily as needed for itching. This medicine should be better than benadryl for the itching you are having and has less side effects. Try taking this and we can see if it is beneficial or not for you.

## 2016-03-20 ENCOUNTER — Telehealth: Payer: Self-pay | Admitting: *Deleted

## 2016-03-20 NOTE — Assessment & Plan Note (Signed)
HPI: He is having pruritis over much of his body not just lower extremities. HE is using vaseline sometimes with partial improvement. He is now taking benadryl daily to help him sleep due to itching. HE has not noticed any actual rashes.  A: This still seems to be primarily xerosis since there is no accompanying rash and it would fit with the winter time course. This may also be worsened with his peripheral neuropathy. If he is trying antihistamines with some improvement we can try something with a better side effect profile than benadryl and reassess.  P: Prescribed hydroxyzine 10mg  TID PRN Without refills, instructed to call back or RTC to discuss medication tolerance and benefit or if skin condition worsens

## 2016-03-20 NOTE — Telephone Encounter (Addendum)
Information was sent o Cover[employer] for [place] request for Gabapentin.  The request is pending review..  Answer within 24 to 72 hours.  Sander Nephew, RN 03/20/2016 4:19 PM.  Fax from [facility] patient was approved for Gabapentin 300 mg capsules 900/90 .  PA is good until 03/21/2017.  Sander Nephew, RN 03/22/2016 10:51 AM

## 2016-03-20 NOTE — Assessment & Plan Note (Signed)
HPI: He has started taking 8 units novolog insulin in the evenings in addition to his 32 units levemir every morning. He admits to missing several doses and occasionally eating very poorly pasta/refined sweets after which he notes CBGs up to the 500s. No episodes of hypoglycemia. HE brought his glucometer for review today which demonstrates peak blood glucose dinners after dinner and trough around breakfast time.  A: Still uncontrolled diabetes with large intra- and inter-daily variations in blood glucose levels. He could tolerate more total daily insulin since he is not getting too low at any time. Moving short acting insulin to before dinner would probably improve his evening peak level without dropping AM trough any more.  P: Move novolog qhs to before dinner whenever possible Increase to 10 units Continue encouragement his progress and medication compliance over the past few months has improved

## 2016-03-21 NOTE — Progress Notes (Signed)
Internal Medicine Clinic Attending  Case discussed with Dr. Rice at the time of the visit.  We reviewed the resident's history and exam and pertinent patient test results.  I agree with the assessment, diagnosis, and plan of care documented in the resident's note.  

## 2016-03-22 ENCOUNTER — Ambulatory Visit: Payer: Medicare HMO | Admitting: Physical Therapy

## 2016-03-22 ENCOUNTER — Encounter: Payer: Self-pay | Admitting: Physical Therapy

## 2016-03-22 DIAGNOSIS — R262 Difficulty in walking, not elsewhere classified: Secondary | ICD-10-CM | POA: Diagnosis not present

## 2016-03-22 DIAGNOSIS — M6281 Muscle weakness (generalized): Secondary | ICD-10-CM

## 2016-03-22 DIAGNOSIS — M25561 Pain in right knee: Secondary | ICD-10-CM

## 2016-03-22 DIAGNOSIS — M25562 Pain in left knee: Principal | ICD-10-CM

## 2016-03-22 DIAGNOSIS — G8929 Other chronic pain: Secondary | ICD-10-CM | POA: Diagnosis not present

## 2016-03-22 NOTE — Therapy (Signed)
[place], [place], [address] Phone: [phone]   Fax:  [phone]  Physical Therapy Treatment/Discharge Summary  Patient Details  Name: Michael [name] MRN: [id number] Date of Birth: September 01, 1955 Referring Provider: Vernelle Emerald, MD  Encounter Date: 03/22/2016      PT End of Session - 03/22/16 0927    Visit Number 10   Number of Visits 12   Date for PT Re-Evaluation 03/17/16   Authorization Type kx modifier 15 visit   PT Start Time 0930   PT Stop Time 1010   PT Time Calculation (min) 40 min   Activity Tolerance Patient tolerated treatment well   Behavior During Therapy [facility] for tasks assessed/performed      Past Medical History:  Diagnosis Date  . Diabetes mellitus without complication ([place])   . Pancreatitis   . Seizures ([place])     Past Surgical History:  Procedure Laterality Date  . BALLOON DILATION  03/22/2011   Procedure: BALLOON DILATION;  Surgeon: Gatha Mayer, MD;  [facility];  Service: Endoscopy;  Laterality: N/A;  . COLONOSCOPY N/A 05/22/2012   Procedure: COLONOSCOPY;  Surgeon: Gatha Mayer, MD;  Location: [place];  Service: Endoscopy;  Laterality: N/A;  . COLONOSCOPY W/ BIOPSIES AND POLYPECTOMY  09/12/11  . ESOPHAGOGASTRODUODENOSCOPY  03/22/2011   Procedure: ESOPHAGOGASTRODUODENOSCOPY (EGD);  Surgeon: Gatha Mayer, MD;  [facility];  Service: Endoscopy;  Laterality: N/A;  egd with balloon   . EUS  04/20/2011   Procedure: UPPER ENDOSCOPIC ULTRASOUND (EUS) LINEAR;  Surgeon: Milus Banister, MD;  [facility];  Service: Endoscopy;  Laterality: N/A;    There were no vitals filed for this visit.      Subjective Assessment - 03/22/16 0932    Subjective pt reports he has been feeling pretty good. reports L knee aches at night. Roney Jaffe he feels a lot better than when he started.    How long can you stand comfortably? 1 hour   How long can you walk  comfortably? 1 hour   Patient Stated Goals be able to walk without pain   Currently in Pain? No/denies            [facility] PT Assessment - 03/22/16 0001      Strength   Right Knee Flexion 5/5   Right Knee Extension 5/5   Left Knee Flexion 5/5   Left Knee Extension 5/5                     [facility] Adult PT Treatment/Exercise - 03/22/16 0001      Knee/Hip Exercises: Stretches   Passive Hamstring Stretch Limitations seated EOB with towel   Piriformis Stretch Limitations figure 4     Knee/Hip Exercises: Aerobic   Nustep L5 5 min     Knee/Hip Exercises: Seated   Long Arc Quad Both;10 reps  3s holds     Knee/Hip Exercises: Supine   Bridges 10 reps   Straight Leg Raises Both;10 reps     Knee/Hip Exercises: Sidelying   Hip ABduction Both;10 reps   Clams x10 each                PT Education - 03/22/16 1001    Education provided Yes   Education Details exercise form/rationale, HEP, goal progress, importance of continued exercise, anatomy of arthritis    Person(s) Educated Patient   Methods Explanation;Demonstration;Tactile cues;Verbal cues;Handout   Comprehension Verbalized understanding;Returned demonstration;Verbal cues required;Tactile  cues required          PT Short Term Goals - 02/17/16 0931      PT SHORT TERM GOAL #1   Title Pt will be independent in his HEP   Baseline minor cues   Period Weeks   Status Achieved           PT Long Term Goals - 03/29/2016 0934      PT LONG TERM GOAL #1   Title Pt will improve bilateral knee flexion/extension strength to 5/5 in order to improve functional mobility an gait.    Baseline 5/5 bilat   Status Achieved     PT LONG TERM GOAL #2   Title pt will be able to amb 30 minutes with pain less than 3/10 in bilateral knees.    Baseline 5/10 but reports he can walk comfortably for an hour or more   Status Partially Met     PT LONG TERM GOAL #3   Title pt will improve his FOTO score from 32% limitation  to 25% limitation   Baseline unable to assess at d/c, FOTO down   Status Unable to assess     PT LONG TERM GOAL #4   Title Independent with long term HEP for continued strengthening following d/c   Baseline verbalized comfort, understanding and compliance   Status Achieved     PT LONG TERM GOAL #5   Title Decreased sensation of L patellar movement indicating improved LE strength and stability   Baseline can still feel it moving "out of wack"   Status Not Met               Plan - 03/29/16 0947    Clinical Impression Statement Pt has made significant progress in reports of pain and functional ability. Verbalizes comfort with and readiness for discharge to independent program. Was instructed to contact us with any further questions or concerns.    Consulted and Agree with Plan of Care Patient      Patient will benefit from skilled therapeutic intervention in order to improve the following deficits and impairments:     Visit Diagnosis: Chronic pain of left knee  Chronic pain of right knee  Difficulty in walking, not elsewhere classified  Muscle weakness (generalized)       G-Codes - 2016/03/29 1010    Functional Assessment Tool Used (Outpatient Only) clinical judgement   Functional Limitation Mobility: Walking and moving around   Mobility: Walking and Moving Around Goal Status [phone]) At least 20 percent but less than 40 percent impaired, limited or restricted   Mobility: Walking and Moving Around Discharge Status [phone]) At least 20 percent but less than 40 percent impaired, limited or restricted      Problem List Patient Active Problem List   Diagnosis Date Noted  . Diabetes mellitus due to underlying condition with ketoacidosis without coma, with long-term current use of insulin ([place]) 02/04/2016  . Dry skin 01/18/2016  . Peripheral vascular disease ([place]) 11/15/2015  . Alcohol abuse 09/02/2015  . Tobacco abuse 09/02/2015  . Tricompartment osteoarthritis of both  knees 06/15/2015  . Chronic abdominal pain 03/14/2015  . Vitamin D deficiency 03/12/2015  . Hyperlipidemia 02/17/2015  . Fall 12/24/2014  . History of alcohol abuse 12/05/2014  . Protein-calorie malnutrition, severe 11/18/2014  . Cervical arthritis ([place]) 01/01/2014  . Health care maintenance 08/25/2011  . Chronic alcoholic pancreatitis ([place]) 04/20/2011  . Type 2 diabetes mellitus with peripheral neuropathy ([place]) 12/09/2010  . HTN (hypertension) 12/09/2010  PHYSICAL THERAPY DISCHARGE SUMMARY  Visits from Start of Care: 10  Current functional level related to goals / functional outcomes: See above   Remaining deficits: See above   Education / Equipment: Anatomy of condition, POC, HEP, exercise form/rationale  Plan: Patient agrees to discharge.  Patient goals were partially met. Patient is being discharged due to being pleased with the current functional level.  ?????    Charna Neeb C. Tashai Catino PT, DPT 03/22/16 10:12 AM   [facility] [address] [place], [place], [address] Phone: [phone]   Fax:  [phone]  Name: Michael [name] MRN: [id number] Date of Birth: Aug 28, 1955

## 2016-03-23 MED ORDER — GLUCOSE BLOOD VI STRP: ORAL_STRIP | 12 refills | Status: DC

## 2016-03-28 ENCOUNTER — Ambulatory Visit: Payer: Medicare HMO | Admitting: Physical Therapy

## 2016-04-06 ENCOUNTER — Other Ambulatory Visit: Payer: Self-pay | Admitting: Internal Medicine

## 2016-04-06 ENCOUNTER — Other Ambulatory Visit: Payer: Self-pay

## 2016-04-06 NOTE — Telephone Encounter (Signed)
ibuprofen (ADVIL,MOTRIN) 800 MG tablet, refill request @ [facility] on [address].

## 2016-04-07 ENCOUNTER — Telehealth: Payer: Self-pay | Admitting: Pulmonary Disease

## 2016-04-07 ENCOUNTER — Telehealth: Payer: Self-pay

## 2016-04-07 MED ORDER — IBUPROFEN 800 MG PO TABS: 800.0000 mg | ORAL_TABLET | Freq: Three times a day (TID) | ORAL | 2 refills | Status: DC | PRN

## 2016-04-07 NOTE — Telephone Encounter (Signed)
This script was sent to his mail order pharm, attempt to call lm for rtc

## 2016-04-07 NOTE — Telephone Encounter (Signed)
Want ibuprofen (ADVIL,MOTRIN) 800 MG tablet to be filled to [facility] on [address].

## 2016-04-07 NOTE — Telephone Encounter (Signed)
   Reason for call:   I received a call from Mr. [name] at 7:09 PM indicating he would like his ibuprofen sent to [facility].   Pertinent Data:   No changes to his chronic pain   Assessment / Plan / Recommendations:   Prescription sent to [facility] on [facility]  As always, pt is advised that if symptoms worsen or new symptoms arise, they should go to an urgent care facility or to ER for further evaluation.   Milagros Loll, MD   04/07/2016, 7:09 PM

## 2016-04-09 ENCOUNTER — Emergency Department (HOSPITAL_COMMUNITY)
Admission: EM | Admit: 2016-04-09 | Discharge: 2016-04-09 | Disposition: A | Discharge status: Home / Self Care | Payer: Medicare HMO | Attending: Emergency Medicine | Admitting: Emergency Medicine

## 2016-04-09 ENCOUNTER — Encounter (HOSPITAL_COMMUNITY): Payer: Self-pay

## 2016-04-09 DIAGNOSIS — R739 Hyperglycemia, unspecified: Secondary | ICD-10-CM

## 2016-04-09 DIAGNOSIS — F17210 Nicotine dependence, cigarettes, uncomplicated: Secondary | ICD-10-CM | POA: Insufficient documentation

## 2016-04-09 DIAGNOSIS — R42 Dizziness and giddiness: Secondary | ICD-10-CM | POA: Diagnosis not present

## 2016-04-09 DIAGNOSIS — I10 Essential (primary) hypertension: Secondary | ICD-10-CM | POA: Insufficient documentation

## 2016-04-09 DIAGNOSIS — R404 Transient alteration of awareness: Secondary | ICD-10-CM | POA: Diagnosis not present

## 2016-04-09 DIAGNOSIS — R1013 Epigastric pain: Secondary | ICD-10-CM | POA: Diagnosis not present

## 2016-04-09 DIAGNOSIS — E1165 Type 2 diabetes mellitus with hyperglycemia: Secondary | ICD-10-CM | POA: Diagnosis not present

## 2016-04-09 DIAGNOSIS — Z794 Long term (current) use of insulin: Secondary | ICD-10-CM | POA: Diagnosis not present

## 2016-04-09 DIAGNOSIS — Z79899 Other long term (current) drug therapy: Secondary | ICD-10-CM | POA: Diagnosis not present

## 2016-04-09 LAB — COMPREHENSIVE METABOLIC PANEL
ALT: 23 U/L (ref 17–63)
ANION GAP: 10 (ref 5–15)
AST: 22 U/L (ref 15–41)
Albumin: 3.4 g/dL — ABNORMAL LOW (ref 3.5–5.0)
Alkaline Phosphatase: 112 U/L (ref 38–126)
BUN: 7 mg/dL (ref 6–20)
CHLORIDE: 105 mmol/L (ref 101–111)
CO2: 24 mmol/L (ref 22–32)
CREATININE: 1.03 mg/dL (ref 0.61–1.24)
Calcium: 8.3 mg/dL — ABNORMAL LOW (ref 8.9–10.3)
Glucose, Bld: 304 mg/dL — ABNORMAL HIGH (ref 65–99)
Potassium: 3.5 mmol/L (ref 3.5–5.1)
Sodium: 139 mmol/L (ref 135–145)
Total Bilirubin: 0.4 mg/dL (ref 0.3–1.2)
Total Protein: 5.9 g/dL — ABNORMAL LOW (ref 6.5–8.1)

## 2016-04-09 LAB — CBC WITH DIFFERENTIAL/PLATELET
BASOS PCT: 0 %
Basophils Absolute: 0 10*3/uL (ref 0.0–0.1)
EOS ABS: 0.1 10*3/uL (ref 0.0–0.7)
Eosinophils Relative: 2 %
HCT: 41.4 % (ref 39.0–52.0)
Hemoglobin: 14.2 g/dL (ref 13.0–17.0)
LYMPHS ABS: 1.6 10*3/uL (ref 0.7–4.0)
Lymphocytes Relative: 26 %
MCH: 31.7 pg (ref 26.0–34.0)
MCHC: 34.3 g/dL (ref 30.0–36.0)
MCV: 92.4 fL (ref 78.0–100.0)
Monocytes Absolute: 0.5 10*3/uL (ref 0.1–1.0)
Monocytes Relative: 8 %
NEUTROS PCT: 64 %
Neutro Abs: 3.8 10*3/uL (ref 1.7–7.7)
Platelets: 146 10*3/uL — ABNORMAL LOW (ref 150–400)
RBC: 4.48 MIL/uL (ref 4.22–5.81)
RDW: 13.5 % (ref 11.5–15.5)
WBC: 5.9 10*3/uL (ref 4.0–10.5)

## 2016-04-09 LAB — URINALYSIS, ROUTINE W REFLEX MICROSCOPIC
BILIRUBIN URINE: NEGATIVE
Bacteria, UA: NONE SEEN
Glucose, UA: >=500 mg/dL — AB
HGB URINE DIPSTICK: NEGATIVE
Ketones, ur: NEGATIVE mg/dL
Leukocytes, UA: NEGATIVE
Nitrite: NEGATIVE
Protein, ur: NEGATIVE mg/dL
Specific Gravity, Urine: 1.025 (ref 1.005–1.030)
pH: 6 (ref 5.0–8.0)

## 2016-04-09 LAB — CBG MONITORING, ED
Glucose-Capillary: 305 mg/dL — ABNORMAL HIGH (ref 65–99)
Glucose-Capillary: 76 mg/dL (ref 65–99)

## 2016-04-09 MED ORDER — SODIUM CHLORIDE 0.9 % IV BOLUS (SEPSIS)
1000.0000 mL | Freq: Once | INTRAVENOUS | Status: AC
  Administered 2016-04-09: 1000 mL via INTRAVENOUS

## 2016-04-09 MED ORDER — INSULIN ASPART 100 UNIT/ML ~~LOC~~ SOLN
SUBCUTANEOUS | Status: AC
  Filled 2016-04-09: qty 1

## 2016-04-09 MED ORDER — INSULIN ASPART 100 UNIT/ML IV SOLN
10.0000 [IU] | Freq: Once | INTRAVENOUS | Status: AC
  Administered 2016-04-09: 10 [IU] via INTRAVENOUS
  Filled 2016-04-09: qty 0.1

## 2016-04-09 NOTE — ED Provider Notes (Signed)
[facility] DEPT Provider Note   CSN: [id number] Arrival date & time: 04/09/16  1521     History   Chief Complaint Chief Complaint  Patient presents with  . Hyperglycemia    CBG en route 229 pt states that he feels light headed which occurs when his BS rises     HPI [name] is a 61 y.o. male.  HPI Patient with history of diabetes. Multiple admission for the same. States he's not taking his medication today. He was on his way home to take his medicine would be he became drowsy. He states he gets this way when his blood sugar is elevated. He came to the emergency department for evaluation. He states his drowsiness has improved. He denies any current chest pain. His abdominal pain is chronic and unchanged. No nausea or vomiting.. No focal weakness or numbness. Past Medical History:  Diagnosis Date  . Diabetes mellitus without complication ([place])   . Pancreatitis   . Seizures [facility])     Patient Active Problem List   Diagnosis Date Noted  . Diabetes mellitus due to underlying condition with ketoacidosis without coma, with long-term current use of insulin ([place]) 02/04/2016  . Dry skin 01/18/2016  . Peripheral vascular disease ([place]) 11/15/2015  . Alcohol abuse 09/02/2015  . Tobacco abuse 09/02/2015  . Tricompartment osteoarthritis of both knees 06/15/2015  . Chronic abdominal pain 03/14/2015  . Vitamin D deficiency 03/12/2015  . Hyperlipidemia 02/17/2015  . Fall 12/24/2014  . History of alcohol abuse 12/05/2014  . Protein-calorie malnutrition, severe 11/18/2014  . Cervical arthritis ([place]) 01/01/2014  . Health care maintenance 08/25/2011  . Chronic alcoholic pancreatitis ([place]) 04/20/2011  . Type 2 diabetes mellitus with peripheral neuropathy ([place]) 12/09/2010  . HTN (hypertension) 12/09/2010    Past Surgical History:  Procedure Laterality Date  . BALLOON DILATION  03/22/2011   Procedure: BALLOON DILATION;  Surgeon: Gatha Mayer, MD;  [facility];   Service: Endoscopy;  Laterality: N/A;  . COLONOSCOPY N/A 05/22/2012   Procedure: COLONOSCOPY;  Surgeon: Gatha Mayer, MD;  Location: [place];  Service: Endoscopy;  Laterality: N/A;  . COLONOSCOPY W/ BIOPSIES AND POLYPECTOMY  09/12/11  . ESOPHAGOGASTRODUODENOSCOPY  03/22/2011   Procedure: ESOPHAGOGASTRODUODENOSCOPY (EGD);  Surgeon: Gatha Mayer, MD;  [facility];  Service: Endoscopy;  Laterality: N/A;  egd with balloon   . EUS  04/20/2011   Procedure: UPPER ENDOSCOPIC ULTRASOUND (EUS) LINEAR;  Surgeon: Milus Banister, MD;  [facility];  Service: Endoscopy;  Laterality: N/A;       Home Medications    Prior to Admission medications   Medication Sig Start Date End Date Taking? Authorizing Provider  ACCU-CHEK FASTCLIX LANCETS MISC Use to check your blood sugar 3 times a day 03/02/16  Yes Collier Salina, MD  amitriptyline (ELAVIL) 50 MG tablet Take 50 mg by mouth at bedtime. 01/07/16  Yes Historical Provider, MD  Blood Glucose Monitoring Suppl (ACCU-CHEK AVIVA PLUS) w/Device KIT Check blood sugar up to 3 times a day 11/16/15  Yes Jule Ser, DO  diclofenac sodium (VOLTAREN) 1 % GEL Apply 4 g topically 4 (four) times daily. Patient taking differently: Apply 4 g topically 4 (four) times daily. FOR DIABETIC PAIN 11/15/15  Yes Shela Leff, MD  DULoxetine (CYMBALTA) 60 MG capsule Take 1 capsule (60 mg total) by mouth daily. 03/02/16  Yes Collier Salina, MD  folic acid (FOLVITE) 1 MG tablet Take 1 tablet (1 mg total) by mouth daily. 03/02/16  Yes Collier Salina, MD  glucose blood (ACCU-CHEK AVIVA PLUS) test strip Use to check your blood sugar 3 times a day. Dx code E11.42. Insulin dependent diabetes. 03/06/16  Yes Collier Salina, MD  glucose blood (AGAMATRIX PRESTO TEST) test strip Use as instructed 03/23/16  Yes Burgess Estelle, MD  hydrOXYzine (ATARAX/VISTARIL) 10 MG tablet Take 1 tablet (10 mg total) by mouth 3 (three) times daily as needed for itching.  03/17/16  Yes Collier Salina, MD  ibuprofen (ADVIL,MOTRIN) 800 MG tablet Take 1 tablet (800 mg total) by mouth every 8 (eight) hours as needed for moderate pain. 04/07/16  Yes Milagros Loll, MD  insulin aspart (NOVOLOG FLEXPEN) 100 UNIT/ML FlexPen Inject 8 units under the skin before Supper or the largest meal of the day Patient taking differently: Inject 10 Units into the skin daily before supper. OR LARGEST MEAL OF THE DAY 03/02/16  Yes Collier Salina, MD  Insulin Detemir (LEVEMIR FLEXTOUCH) 100 UNIT/ML Pen Inject 32 Units into the skin every morning. 03/17/16  Yes Collier Salina, MD  Insulin Pen Needle 31G X 5 MM MISC Use to inject 15 units of Levemir one time a day 09/06/15  Yes Carly Montey Hora, MD  Lancet Devices [facility]) lancets Use to check your blood sugar 3 times a day. Dx code E11.65. Insulin dependent diabetes. 03/06/16  Yes Collier Salina, MD  lipase/protease/amylase (CREON) 36000 UNITS CPEP capsule TAKE 1 CAPSULE BY MOUTH 3  TIMES DAILY BEFORE MEALS 03/02/16  Yes Collier Salina, MD  loperamide (IMODIUM) 2 MG capsule TAKE 1 CAPSULE BY MOUTH AS  NEEDED FOR DIARRHEA OR  LOOSE STOOLS. NO MORE THAN  8 CAPSULES IN 1 DAY. 03/03/16  Yes Collier Salina, MD  lovastatin (MEVACOR) 40 MG tablet Take 1 tablet (40 mg total) by mouth at bedtime. 03/02/16  Yes Collier Salina, MD  pantoprazole (PROTONIX) 40 MG tablet Take 1 tablet (40 mg total) by mouth daily. 03/02/16  Yes Collier Salina, MD  Vitamin D, Ergocalciferol, (DRISDOL) 50000 units CAPS capsule TAKE 1 CAPSULE BY MOUTH ONCE WEEKLY EVERY SUNDAY 02/14/16  Yes Collier Salina, MD  gabapentin (NEURONTIN) 300 MG capsule TAKE 3 CAPSULES BY MOUTH  EVERY MORNING AND  AFTERNOON. TAKE 4 CAPSULES  BY MOUTH AT NIGHT BEFORE  BED 03/10/16   Collier Salina, MD    Family History Family History  Problem Relation Age of Onset  . Hypertension Sister   . Diabetes Sister   . Heart disease Sister   . Colon cancer Neg Hx   .  Stomach cancer Neg Hx   . Anesthesia problems Neg Hx   . Hypotension Neg Hx   . Pseudochol deficiency Neg Hx   . Malignant hyperthermia Neg Hx     Social History Social History  Substance Use Topics  . Smoking status: Current Every Day Smoker    Packs/day: 0.40    Years: 40.00    Types: Cigarettes  . Smokeless tobacco: Never Used     Comment: 5 cigs per day  . Alcohol use 6.6 oz/week    10 Standard drinks or equivalent, 1 Cans of beer per week     Comment: drinks occassionally     Allergies   Ace inhibitors; Losartan; and Tylenol [acetaminophen]   Review of Systems Review of Systems  Constitutional: Positive for fatigue. Negative for chills and fever.  Respiratory: Negative for cough and shortness of breath.   Cardiovascular: Negative for chest pain.  Gastrointestinal: Positive for abdominal pain. Negative for blood in stool, constipation, diarrhea, nausea and vomiting.  Musculoskeletal: Negative for back pain, myalgias, neck pain and neck stiffness.  Skin: Negative for rash and wound.  Neurological: Negative for dizziness, syncope, weakness, light-headedness, numbness and headaches.  All other systems reviewed and are negative.    Physical Exam Updated Vital Signs BP 135/73 (BP Location: Right Arm)   Pulse (!) 106   Temp 98.5 F (36.9 C) (Oral)   Resp 18   Ht _0  (1.626 m)   Wt 153 lb (69.4 kg)   SpO2 100%   BMI 26.26 kg/m   Physical Exam  Constitutional: He is oriented to person, place, and time. He appears well-developed and well-nourished. No distress.  HENT:  Head: Normocephalic and atraumatic.  Mildly dry mucous membranes  Eyes: EOM are normal. Pupils are equal, round, and reactive to light.  Neck: Normal range of motion. Neck supple.  No meningismus  Cardiovascular: Normal rate and regular rhythm.  Exam reveals no gallop and no friction rub.   No murmur heard. Pulmonary/Chest: Effort normal and breath sounds normal. He has no wheezes. He has no  rales.  Abdominal: Soft. Bowel sounds are normal. There is tenderness (mild epigastric tenderness to palpation. No rebound or guarding.). There is no rebound and no guarding.  Musculoskeletal: Normal range of motion. He exhibits no edema or tenderness.  No lower extremity swelling or asymmetry.  Neurological: He is alert and oriented to person, place, and time.  Sensation fully intact. 5/5 motor in all extremity is.  Skin: Skin is warm and dry. Capillary refill takes less than 2 seconds. No rash noted. No erythema.  Psychiatric: He has a normal mood and affect. His behavior is normal.  Nursing note and vitals reviewed.    ED Treatments / Results  Labs (all labs ordered are listed, but only abnormal results are displayed) Labs Reviewed  COMPREHENSIVE METABOLIC PANEL - Abnormal; Notable for the following:       Result Value   Glucose, Bld 304 (*)    Calcium 8.3 (*)    Total Protein 5.9 (*)    Albumin 3.4 (*)    All other components within normal limits  CBC WITH DIFFERENTIAL/PLATELET - Abnormal; Notable for the following:    Platelets 146 (*)    All other components within normal limits  URINALYSIS, ROUTINE W REFLEX MICROSCOPIC - Abnormal; Notable for the following:    Glucose, UA >=500 (*)    Squamous Epithelial / LPF 0-5 (*)    All other components within normal limits  CBG MONITORING, ED - Abnormal; Notable for the following:    Glucose-Capillary 305 (*)    All other components within normal limits  CBG MONITORING, ED    EKG  EKG Interpretation [name]       Radiology No results found.  Procedures Procedures (including critical care time)  Medications Ordered in ED Medications  sodium chloride 0.9 % bolus 1,000 mL (1,000 mLs Intravenous New Bag/Given 04/09/16 1742)  insulin aspart (novoLOG) injection 10 Units (10 Units Intravenous Given 04/09/16 1742)     Initial Impression / Assessment and Plan / ED Course  I have reviewed the triage vital signs and the nursing  notes.  Pertinent labs & imaging results that were available during my care of the patient were reviewed by me and considered in my medical decision making (see chart for details).   patient's hyperglycemia has resolved. Given IV fluids. Eating sandwich in the emergency  department. Discharged home to follow-up with his primary physician. Encouraged to take his medications as prescribed. Return precautions given.    Final Clinical Impressions(s) / ED Diagnoses   Final diagnoses:  Hyperglycemia    New Prescriptions New Prescriptions   No medications on file     Julianne Rice, MD 04/09/16 [phone]

## 2016-04-09 NOTE — ED Triage Notes (Signed)
Pt states that he was unable to take his DM meds today because he could not get home pt appears in no distress, does state that he has been having upper neck and back problems

## 2016-04-13 ENCOUNTER — Telehealth: Payer: Self-pay | Admitting: Internal Medicine

## 2016-04-13 NOTE — Telephone Encounter (Signed)
APT. REMINDER CALL, LMTCB °

## 2016-04-14 ENCOUNTER — Encounter: Payer: Self-pay | Admitting: Internal Medicine

## 2016-04-14 ENCOUNTER — Ambulatory Visit: Payer: Medicare HMO

## 2016-04-26 ENCOUNTER — Telehealth: Payer: Self-pay

## 2016-04-26 NOTE — Telephone Encounter (Signed)
glucose blood (ACCU-CHEK AVIVA PLUS) test strip, refill requesting @ [facility] on [address].

## 2016-04-26 NOTE — Telephone Encounter (Signed)
Called pt, he acknowledges he was sent a 3 month supply in feb just wanted to get some more so he wouldn't be hassled about them he then states miss butler(I think he means dr Software engineer) sent him a letter hassling him about appointments, states he was just in hospital and he only comes when he is sick, he is advised that he must be seen in office on a regular basis for things to go well, scheduled appt w/ dr rice for 5/25 at 1330

## 2016-05-04 ENCOUNTER — Other Ambulatory Visit: Payer: Self-pay

## 2016-05-04 NOTE — Telephone Encounter (Signed)
Call to patient no answer left voicemail test strip Rx sent to [facility]

## 2016-05-04 NOTE — Telephone Encounter (Signed)
Pt states test strips is not at [facility]. He want the nurse to contact [facility] @ [phone].

## 2016-05-08 ENCOUNTER — Telehealth: Payer: Self-pay | Admitting: Internal Medicine

## 2016-05-08 ENCOUNTER — Encounter: Payer: Self-pay | Admitting: *Deleted

## 2016-05-08 ENCOUNTER — Telehealth: Payer: Self-pay | Admitting: *Deleted

## 2016-05-08 DIAGNOSIS — E0810 Diabetes mellitus due to underlying condition with ketoacidosis without coma: Secondary | ICD-10-CM

## 2016-05-08 DIAGNOSIS — Z794 Long term (current) use of insulin: Principal | ICD-10-CM

## 2016-05-08 DIAGNOSIS — E1142 Type 2 diabetes mellitus with diabetic polyneuropathy: Secondary | ICD-10-CM

## 2016-05-08 MED ORDER — GLUCOSE BLOOD VI STRP: ORAL_STRIP | 3 refills | Status: DC

## 2016-05-08 NOTE — Telephone Encounter (Signed)
A rep from [employer] order called and stated pt called them and stated he had just seen dr rice and dr rice changed his testing to 4 times daily and he needed new test strips because he was running out, reminded her that script that Mcarthur Rossetti should have allows pt to test  4x daily with 40 left over after 3 months. She states they did have the script and it does allow testing 4 times daily. Pt calls and states he only got 300 strips in his last shipment, he was informed that he needed to speak w/ [employer] about this and that he had not seen dr rice recently, he was reminded of his upcoming appt. He began cursing and screaming and was told to not scream nor curse. He stated "you better get my damn strips" I informed him that there was nothing I could do that he would need to speak with [employer] he hung up

## 2016-05-08 NOTE — Telephone Encounter (Signed)
Dr. Quay Burow spoke with the patient today and reports adjusting his current test strip orders.

## 2016-05-08 NOTE — Telephone Encounter (Signed)
Sent in test strip refill to [facility].  Martyn Malay, DO PGY-3 Internal Medicine Resident Pager # [phone] 05/08/2016 6:54 PM

## 2016-05-09 ENCOUNTER — Other Ambulatory Visit: Payer: Self-pay | Admitting: *Deleted

## 2016-05-10 ENCOUNTER — Telehealth: Payer: Self-pay | Admitting: Internal Medicine

## 2016-05-10 NOTE — Telephone Encounter (Signed)
Spoke to [employer]) & she stated test strips has been transferred & patient has been notified.

## 2016-05-10 NOTE — Telephone Encounter (Signed)
Spoke to [place] ([facility]) to transfer rx for test strips. I was advised to call [facility] & have pharmacist call them. I called [facility] pharmacist, provided [employer]'s ph #& patient's phone number. They will call patient with updates.

## 2016-05-10 NOTE — Telephone Encounter (Signed)
Test strips [facility] [place] [address] [place] [facility]  Please call pt

## 2016-05-15 ENCOUNTER — Other Ambulatory Visit: Payer: Self-pay | Admitting: Internal Medicine

## 2016-05-15 DIAGNOSIS — E1142 Type 2 diabetes mellitus with diabetic polyneuropathy: Secondary | ICD-10-CM

## 2016-05-31 ENCOUNTER — Inpatient Hospital Stay (HOSPITAL_COMMUNITY)
Admission: AD | Admit: 2016-05-31 | Discharge: 2016-06-05 | DRG: 885 | Disposition: A | Discharge status: Other Acute Inpatient Hospital | Payer: Medicare HMO | Source: Intra-hospital | Attending: Student in an Organized Health Care Education/Training Program | Admitting: Student in an Organized Health Care Education/Training Program

## 2016-05-31 ENCOUNTER — Emergency Department (HOSPITAL_COMMUNITY)
Admission: EM | Admit: 2016-05-31 | Discharge: 2016-05-31 | Disposition: A | Discharge status: Other Acute Inpatient Hospital | Payer: Medicare HMO | Attending: Emergency Medicine | Admitting: Emergency Medicine

## 2016-05-31 ENCOUNTER — Encounter (HOSPITAL_COMMUNITY): Payer: Self-pay | Admitting: *Deleted

## 2016-05-31 DIAGNOSIS — K860 Alcohol-induced chronic pancreatitis: Secondary | ICD-10-CM | POA: Diagnosis not present

## 2016-05-31 DIAGNOSIS — Z888 Allergy status to other drugs, medicaments and biological substances status: Secondary | ICD-10-CM

## 2016-05-31 DIAGNOSIS — R442 Other hallucinations: Secondary | ICD-10-CM | POA: Diagnosis not present

## 2016-05-31 DIAGNOSIS — F329 Major depressive disorder, single episode, unspecified: Secondary | ICD-10-CM | POA: Diagnosis not present

## 2016-05-31 DIAGNOSIS — F10231 Alcohol dependence with withdrawal delirium: Secondary | ICD-10-CM | POA: Diagnosis not present

## 2016-05-31 DIAGNOSIS — E1165 Type 2 diabetes mellitus with hyperglycemia: Secondary | ICD-10-CM | POA: Diagnosis not present

## 2016-05-31 DIAGNOSIS — G9340 Encephalopathy, unspecified: Secondary | ICD-10-CM | POA: Diagnosis not present

## 2016-05-31 DIAGNOSIS — R45850 Homicidal ideations: Secondary | ICD-10-CM | POA: Diagnosis not present

## 2016-05-31 DIAGNOSIS — F10239 Alcohol dependence with withdrawal, unspecified: Secondary | ICD-10-CM | POA: Diagnosis present

## 2016-05-31 DIAGNOSIS — F10220 Alcohol dependence with intoxication, uncomplicated: Secondary | ICD-10-CM | POA: Diagnosis not present

## 2016-05-31 DIAGNOSIS — F310 Bipolar disorder, current episode hypomanic: Secondary | ICD-10-CM | POA: Diagnosis not present

## 2016-05-31 DIAGNOSIS — F10931 Alcohol use, unspecified with withdrawal delirium: Secondary | ICD-10-CM

## 2016-05-31 DIAGNOSIS — R569 Unspecified convulsions: Secondary | ICD-10-CM | POA: Diagnosis present

## 2016-05-31 DIAGNOSIS — G2581 Restless legs syndrome: Secondary | ICD-10-CM | POA: Diagnosis not present

## 2016-05-31 DIAGNOSIS — E785 Hyperlipidemia, unspecified: Secondary | ICD-10-CM | POA: Diagnosis present

## 2016-05-31 DIAGNOSIS — E1151 Type 2 diabetes mellitus with diabetic peripheral angiopathy without gangrene: Secondary | ICD-10-CM | POA: Diagnosis present

## 2016-05-31 DIAGNOSIS — Z9114 Patient's other noncompliance with medication regimen: Secondary | ICD-10-CM

## 2016-05-31 DIAGNOSIS — Z833 Family history of diabetes mellitus: Secondary | ICD-10-CM

## 2016-05-31 DIAGNOSIS — Z79899 Other long term (current) drug therapy: Secondary | ICD-10-CM | POA: Diagnosis not present

## 2016-05-31 DIAGNOSIS — F1010 Alcohol abuse, uncomplicated: Secondary | ICD-10-CM | POA: Diagnosis not present

## 2016-05-31 DIAGNOSIS — F319 Bipolar disorder, unspecified: Secondary | ICD-10-CM | POA: Diagnosis present

## 2016-05-31 DIAGNOSIS — F1099 Alcohol use, unspecified with unspecified alcohol-induced disorder: Secondary | ICD-10-CM | POA: Diagnosis not present

## 2016-05-31 DIAGNOSIS — E1065 Type 1 diabetes mellitus with hyperglycemia: Secondary | ICD-10-CM | POA: Diagnosis present

## 2016-05-31 DIAGNOSIS — F10939 Alcohol use, unspecified with withdrawal, unspecified: Secondary | ICD-10-CM | POA: Diagnosis present

## 2016-05-31 DIAGNOSIS — I10 Essential (primary) hypertension: Secondary | ICD-10-CM | POA: Insufficient documentation

## 2016-05-31 DIAGNOSIS — Z716 Tobacco abuse counseling: Secondary | ICD-10-CM | POA: Diagnosis not present

## 2016-05-31 DIAGNOSIS — G40909 Epilepsy, unspecified, not intractable, without status epilepticus: Secondary | ICD-10-CM | POA: Diagnosis not present

## 2016-05-31 DIAGNOSIS — F17210 Nicotine dependence, cigarettes, uncomplicated: Secondary | ICD-10-CM | POA: Insufficient documentation

## 2016-05-31 DIAGNOSIS — F10120 Alcohol abuse with intoxication, uncomplicated: Secondary | ICD-10-CM | POA: Diagnosis not present

## 2016-05-31 DIAGNOSIS — E1069 Type 1 diabetes mellitus with other specified complication: Secondary | ICD-10-CM | POA: Diagnosis not present

## 2016-05-31 DIAGNOSIS — K861 Other chronic pancreatitis: Secondary | ICD-10-CM | POA: Diagnosis not present

## 2016-05-31 DIAGNOSIS — Z781 Physical restraint status: Secondary | ICD-10-CM

## 2016-05-31 DIAGNOSIS — Z886 Allergy status to analgesic agent status: Secondary | ICD-10-CM | POA: Diagnosis not present

## 2016-05-31 DIAGNOSIS — Y908 Blood alcohol level of 240 mg/100 ml or more: Secondary | ICD-10-CM | POA: Diagnosis not present

## 2016-05-31 DIAGNOSIS — Z794 Long term (current) use of insulin: Secondary | ICD-10-CM | POA: Diagnosis not present

## 2016-05-31 DIAGNOSIS — E10649 Type 1 diabetes mellitus with hypoglycemia without coma: Secondary | ICD-10-CM | POA: Diagnosis not present

## 2016-05-31 DIAGNOSIS — K219 Gastro-esophageal reflux disease without esophagitis: Secondary | ICD-10-CM | POA: Diagnosis present

## 2016-05-31 DIAGNOSIS — Z720 Tobacco use: Secondary | ICD-10-CM | POA: Diagnosis not present

## 2016-05-31 DIAGNOSIS — K8689 Other specified diseases of pancreas: Secondary | ICD-10-CM | POA: Diagnosis present

## 2016-05-31 DIAGNOSIS — E1042 Type 1 diabetes mellitus with diabetic polyneuropathy: Secondary | ICD-10-CM | POA: Diagnosis not present

## 2016-05-31 DIAGNOSIS — Z8249 Family history of ischemic heart disease and other diseases of the circulatory system: Secondary | ICD-10-CM | POA: Diagnosis not present

## 2016-05-31 DIAGNOSIS — R45851 Suicidal ideations: Secondary | ICD-10-CM | POA: Diagnosis not present

## 2016-05-31 DIAGNOSIS — E108 Type 1 diabetes mellitus with unspecified complications: Secondary | ICD-10-CM | POA: Diagnosis not present

## 2016-05-31 DIAGNOSIS — G4700 Insomnia, unspecified: Secondary | ICD-10-CM | POA: Diagnosis present

## 2016-05-31 DIAGNOSIS — M25569 Pain in unspecified knee: Secondary | ICD-10-CM | POA: Diagnosis not present

## 2016-05-31 DIAGNOSIS — F419 Anxiety disorder, unspecified: Secondary | ICD-10-CM | POA: Diagnosis present

## 2016-05-31 DIAGNOSIS — F10920 Alcohol use, unspecified with intoxication, uncomplicated: Secondary | ICD-10-CM

## 2016-05-31 DIAGNOSIS — E1142 Type 2 diabetes mellitus with diabetic polyneuropathy: Secondary | ICD-10-CM | POA: Diagnosis present

## 2016-05-31 DIAGNOSIS — E876 Hypokalemia: Secondary | ICD-10-CM | POA: Diagnosis present

## 2016-05-31 DIAGNOSIS — R410 Disorientation, unspecified: Secondary | ICD-10-CM | POA: Diagnosis not present

## 2016-05-31 DIAGNOSIS — E118 Type 2 diabetes mellitus with unspecified complications: Secondary | ICD-10-CM | POA: Diagnosis not present

## 2016-05-31 DIAGNOSIS — R739 Hyperglycemia, unspecified: Secondary | ICD-10-CM

## 2016-05-31 LAB — COMPREHENSIVE METABOLIC PANEL
ALT: 78 U/L — ABNORMAL HIGH (ref 17–63)
AST: 115 U/L — ABNORMAL HIGH (ref 15–41)
Albumin: 3.9 g/dL (ref 3.5–5.0)
Alkaline Phosphatase: 151 U/L — ABNORMAL HIGH (ref 38–126)
Anion gap: 14 (ref 5–15)
BUN: 5 mg/dL — ABNORMAL LOW (ref 6–20)
CHLORIDE: 102 mmol/L (ref 101–111)
CO2: 25 mmol/L (ref 22–32)
Calcium: 8.7 mg/dL — ABNORMAL LOW (ref 8.9–10.3)
Creatinine, Ser: 0.67 mg/dL (ref 0.61–1.24)
Glucose, Bld: 268 mg/dL — ABNORMAL HIGH (ref 65–99)
POTASSIUM: 3.4 mmol/L — AB (ref 3.5–5.1)
SODIUM: 141 mmol/L (ref 135–145)
Total Bilirubin: 0.8 mg/dL (ref 0.3–1.2)
Total Protein: 6.3 g/dL — ABNORMAL LOW (ref 6.5–8.1)

## 2016-05-31 LAB — URINALYSIS, ROUTINE W REFLEX MICROSCOPIC
BILIRUBIN URINE: NEGATIVE
GLUCOSE, UA: 150 mg/dL — AB
HGB URINE DIPSTICK: NEGATIVE
KETONES UR: NEGATIVE mg/dL
Leukocytes, UA: NEGATIVE
Nitrite: NEGATIVE
Protein, ur: NEGATIVE mg/dL
Specific Gravity, Urine: 1.018 (ref 1.005–1.030)
pH: 5 (ref 5.0–8.0)

## 2016-05-31 LAB — RAPID URINE DRUG SCREEN, HOSP PERFORMED
AMPHETAMINES: NOT DETECTED
BARBITURATES: NOT DETECTED
BENZODIAZEPINES: NOT DETECTED
COCAINE: NOT DETECTED
Opiates: NOT DETECTED
TETRAHYDROCANNABINOL: POSITIVE — AB

## 2016-05-31 LAB — CBG MONITORING, ED
Glucose-Capillary: 203 mg/dL — ABNORMAL HIGH (ref 65–99)
Glucose-Capillary: 120 mg/dL — ABNORMAL HIGH (ref 65–99)
Glucose-Capillary: 61 mg/dL — ABNORMAL LOW (ref 65–99)

## 2016-05-31 LAB — CBC
HEMATOCRIT: 41.7 % (ref 39.0–52.0)
Hemoglobin: 14.7 g/dL (ref 13.0–17.0)
MCH: 32.1 pg (ref 26.0–34.0)
MCHC: 35.3 g/dL (ref 30.0–36.0)
MCV: 91 fL (ref 78.0–100.0)
Platelets: 113 10*3/uL — ABNORMAL LOW (ref 150–400)
RBC: 4.58 MIL/uL (ref 4.22–5.81)
RDW: 14.7 % (ref 11.5–15.5)
WBC: 5.6 10*3/uL (ref 4.0–10.5)

## 2016-05-31 LAB — GLUCOSE, CAPILLARY
Glucose-Capillary: 237 mg/dL — ABNORMAL HIGH (ref 65–99)
Glucose-Capillary: 235 mg/dL — ABNORMAL HIGH (ref 65–99)

## 2016-05-31 LAB — ETHANOL: ALCOHOL ETHYL (B): 248 mg/dL — AB (ref <5)

## 2016-05-31 MED ORDER — GABAPENTIN 300 MG PO CAPS
300.0000 mg | ORAL_CAPSULE | Freq: Two times a day (BID) | ORAL | Status: DC
  Administered 2016-05-31: 300 mg via ORAL
  Filled 2016-05-31: qty 1

## 2016-05-31 MED ORDER — CHLORDIAZEPOXIDE HCL 25 MG PO CAPS: 25.0000 mg | ORAL_CAPSULE | ORAL | Status: DC

## 2016-05-31 MED ORDER — FOLIC ACID 1 MG PO TABS
1.0000 mg | ORAL_TABLET | Freq: Every day | ORAL | Status: DC
  Administered 2016-06-01 – 2016-06-04 (×4): 1 mg via ORAL
  Filled 2016-05-31 (×6): qty 1

## 2016-05-31 MED ORDER — HYDROXYZINE HCL 10 MG PO TABS: 10.0000 mg | ORAL_TABLET | Freq: Three times a day (TID) | ORAL | Status: DC | PRN

## 2016-05-31 MED ORDER — INSULIN ASPART 100 UNIT/ML ~~LOC~~ SOLN
0.0000 [IU] | Freq: Three times a day (TID) | SUBCUTANEOUS | Status: DC
Start: 2016-05-31 — End: 2016-05-31
  Administered 2016-05-31: 5 [IU] via SUBCUTANEOUS

## 2016-05-31 MED ORDER — CHLORDIAZEPOXIDE HCL 25 MG PO CAPS: 25.0000 mg | ORAL_CAPSULE | Freq: Four times a day (QID) | ORAL | Status: DC | PRN

## 2016-05-31 MED ORDER — LORAZEPAM 1 MG PO TABS
1.0000 mg | ORAL_TABLET | Freq: Four times a day (QID) | ORAL | Status: DC | PRN
  Administered 2016-06-03 – 2016-06-04 (×2): 1 mg via ORAL
  Filled 2016-05-31 (×2): qty 1

## 2016-05-31 MED ORDER — INSULIN ASPART 100 UNIT/ML ~~LOC~~ SOLN
0.0000 [IU] | Freq: Three times a day (TID) | SUBCUTANEOUS | Status: DC
  Administered 2016-06-01: 5 [IU] via SUBCUTANEOUS
  Administered 2016-06-01: 7 [IU] via SUBCUTANEOUS
  Administered 2016-06-01: 2 [IU] via SUBCUTANEOUS
  Administered 2016-06-02: 7 [IU] via SUBCUTANEOUS
  Administered 2016-06-02: 2 [IU] via SUBCUTANEOUS
  Administered 2016-06-02: 7 [IU] via SUBCUTANEOUS
  Administered 2016-06-03 (×2): 3 [IU] via SUBCUTANEOUS
  Administered 2016-06-04: 2 [IU] via SUBCUTANEOUS
  Administered 2016-06-04: 9 [IU] via SUBCUTANEOUS
  Administered 2016-06-04: 5 [IU] via SUBCUTANEOUS

## 2016-05-31 MED ORDER — AMITRIPTYLINE HCL 25 MG PO TABS
50.0000 mg | ORAL_TABLET | Freq: Every day | ORAL | Status: DC
  Administered 2016-05-31 – 2016-06-04 (×5): 50 mg via ORAL
  Filled 2016-05-31 (×2): qty 1
  Filled 2016-05-31: qty 2
  Filled 2016-05-31: qty 1
  Filled 2016-05-31: qty 2
  Filled 2016-05-31 (×3): qty 1

## 2016-05-31 MED ORDER — HYDROXYZINE HCL 25 MG PO TABS: 25.0000 mg | ORAL_TABLET | Freq: Four times a day (QID) | ORAL | Status: AC | PRN

## 2016-05-31 MED ORDER — VITAMIN B-1 100 MG PO TABS
100.0000 mg | ORAL_TABLET | Freq: Every day | ORAL | Status: DC
  Administered 2016-06-01 – 2016-06-04 (×4): 100 mg via ORAL
  Filled 2016-05-31 (×8): qty 1

## 2016-05-31 MED ORDER — LORAZEPAM 1 MG PO TABS: 1.0000 mg | ORAL_TABLET | Freq: Three times a day (TID) | ORAL | Status: DC | PRN

## 2016-05-31 MED ORDER — INSULIN ASPART 100 UNIT/ML ~~LOC~~ SOLN
0.0000 [IU] | Freq: Three times a day (TID) | SUBCUTANEOUS | Status: DC
Start: 2016-05-31 — End: 2016-05-31
  Administered 2016-05-31: 5 [IU] via SUBCUTANEOUS
  Filled 2016-05-31: qty 1

## 2016-05-31 MED ORDER — PRAVASTATIN SODIUM 40 MG PO TABS
40.0000 mg | ORAL_TABLET | Freq: Every day | ORAL | Status: DC
  Filled 2016-05-31: qty 1

## 2016-05-31 MED ORDER — CHLORDIAZEPOXIDE HCL 25 MG PO CAPS
25.0000 mg | ORAL_CAPSULE | Freq: Four times a day (QID) | ORAL | Status: DC
  Filled 2016-05-31: qty 1

## 2016-05-31 MED ORDER — GABAPENTIN 300 MG PO CAPS
300.0000 mg | ORAL_CAPSULE | Freq: Two times a day (BID) | ORAL | Status: DC
  Administered 2016-05-31 – 2016-06-02 (×5): 300 mg via ORAL
  Filled 2016-05-31 (×12): qty 1

## 2016-05-31 MED ORDER — NICOTINE 21 MG/24HR TD PT24
21.0000 mg | MEDICATED_PATCH | Freq: Every day | TRANSDERMAL | Status: DC
  Administered 2016-06-01 – 2016-06-04 (×4): 21 mg via TRANSDERMAL
  Filled 2016-05-31 (×7): qty 1

## 2016-05-31 MED ORDER — THIAMINE HCL 100 MG/ML IJ SOLN: 100.0000 mg | Freq: Once | INTRAMUSCULAR | Status: DC

## 2016-05-31 MED ORDER — ZOLPIDEM TARTRATE 5 MG PO TABS: 5.0000 mg | ORAL_TABLET | Freq: Every evening | ORAL | Status: DC | PRN

## 2016-05-31 MED ORDER — SODIUM CHLORIDE 0.9 % IV BOLUS (SEPSIS)
1000.0000 mL | Freq: Once | INTRAVENOUS | Status: AC
  Administered 2016-05-31: 1000 mL via INTRAVENOUS

## 2016-05-31 MED ORDER — LORAZEPAM 1 MG PO TABS: 0.0000 mg | ORAL_TABLET | Freq: Two times a day (BID) | ORAL | Status: DC

## 2016-05-31 MED ORDER — MAGNESIUM HYDROXIDE 400 MG/5ML PO SUSP: 30.0000 mL | Freq: Every day | ORAL | Status: DC | PRN

## 2016-05-31 MED ORDER — POTASSIUM CHLORIDE CRYS ER 20 MEQ PO TBCR
40.0000 meq | EXTENDED_RELEASE_TABLET | Freq: Once | ORAL | Status: AC
  Administered 2016-05-31: 40 meq via ORAL
  Filled 2016-05-31: qty 2

## 2016-05-31 MED ORDER — LORAZEPAM 1 MG PO TABS: 0.0000 mg | ORAL_TABLET | Freq: Four times a day (QID) | ORAL | Status: DC

## 2016-05-31 MED ORDER — DULOXETINE HCL 30 MG PO CPEP
60.0000 mg | ORAL_CAPSULE | Freq: Every day | ORAL | Status: DC
  Administered 2016-05-31: 60 mg via ORAL
  Filled 2016-05-31: qty 2

## 2016-05-31 MED ORDER — INSULIN ASPART 100 UNIT/ML ~~LOC~~ SOLN
0.0000 [IU] | Freq: Every day | SUBCUTANEOUS | Status: DC
  Administered 2016-06-01: 4 [IU] via SUBCUTANEOUS
  Administered 2016-06-02: 2 [IU] via SUBCUTANEOUS
  Administered 2016-06-03: 5 [IU] via SUBCUTANEOUS
  Administered 2016-06-04: 3 [IU] via SUBCUTANEOUS

## 2016-05-31 MED ORDER — PANTOPRAZOLE SODIUM 40 MG PO TBEC
40.0000 mg | DELAYED_RELEASE_TABLET | Freq: Every day | ORAL | Status: DC
  Administered 2016-05-31: 40 mg via ORAL
  Filled 2016-05-31: qty 1

## 2016-05-31 MED ORDER — IBUPROFEN 200 MG PO TABS
600.0000 mg | ORAL_TABLET | Freq: Three times a day (TID) | ORAL | Status: DC | PRN
  Administered 2016-06-01 – 2016-06-03 (×2): 600 mg via ORAL
  Filled 2016-05-31 (×2): qty 1

## 2016-05-31 MED ORDER — ONDANSETRON HCL 4 MG PO TABS: 4.0000 mg | ORAL_TABLET | Freq: Three times a day (TID) | ORAL | Status: DC | PRN

## 2016-05-31 MED ORDER — CHLORDIAZEPOXIDE HCL 25 MG PO CAPS: 25.0000 mg | ORAL_CAPSULE | Freq: Three times a day (TID) | ORAL | Status: DC

## 2016-05-31 MED ORDER — PANCRELIPASE (LIP-PROT-AMYL) 36000-114000 UNITS PO CPEP
36000.0000 [IU] | ORAL_CAPSULE | Freq: Three times a day (TID) | ORAL | Status: DC
  Administered 2016-05-31 (×2): 36000 [IU] via ORAL
  Filled 2016-05-31 (×3): qty 1

## 2016-05-31 MED ORDER — AMITRIPTYLINE HCL 25 MG PO TABS: 50.0000 mg | ORAL_TABLET | Freq: Every day | ORAL | Status: DC

## 2016-05-31 MED ORDER — PANCRELIPASE (LIP-PROT-AMYL) 36000-114000 UNITS PO CPEP
36000.0000 [IU] | ORAL_CAPSULE | Freq: Three times a day (TID) | ORAL | Status: DC
  Administered 2016-05-31 – 2016-06-04 (×13): 36000 [IU] via ORAL
  Filled 2016-05-31 (×20): qty 1

## 2016-05-31 MED ORDER — INSULIN ASPART 100 UNIT/ML FLEXPEN: 10.0000 [IU] | PEN_INJECTOR | Freq: Every day | SUBCUTANEOUS | Status: DC

## 2016-05-31 MED ORDER — THIAMINE HCL 100 MG/ML IJ SOLN: 100.0000 mg | Freq: Every day | INTRAMUSCULAR | Status: DC

## 2016-05-31 MED ORDER — LOPERAMIDE HCL 2 MG PO CAPS: 2.0000 mg | ORAL_CAPSULE | ORAL | Status: AC | PRN

## 2016-05-31 MED ORDER — ALUM & MAG HYDROXIDE-SIMETH 200-200-20 MG/5ML PO SUSP: 30.0000 mL | ORAL | Status: DC | PRN

## 2016-05-31 MED ORDER — ONDANSETRON 4 MG PO TBDP: 4.0000 mg | ORAL_TABLET | Freq: Four times a day (QID) | ORAL | Status: AC | PRN

## 2016-05-31 MED ORDER — TRAZODONE HCL 50 MG PO TABS
50.0000 mg | ORAL_TABLET | Freq: Every evening | ORAL | Status: DC | PRN
  Administered 2016-06-01 – 2016-06-02 (×2): 50 mg via ORAL
  Filled 2016-05-31 (×2): qty 1

## 2016-05-31 MED ORDER — ADULT MULTIVITAMIN W/MINERALS CH
1.0000 | ORAL_TABLET | Freq: Every day | ORAL | Status: DC
  Administered 2016-05-31 – 2016-06-04 (×5): 1 via ORAL
  Filled 2016-05-31 (×10): qty 1

## 2016-05-31 MED ORDER — VITAMIN B-1 100 MG PO TABS: 100.0000 mg | ORAL_TABLET | Freq: Every day | ORAL | Status: DC

## 2016-05-31 MED ORDER — IBUPROFEN 200 MG PO TABS: 600.0000 mg | ORAL_TABLET | Freq: Three times a day (TID) | ORAL | Status: DC | PRN

## 2016-05-31 MED ORDER — DICLOFENAC SODIUM 1 % TD GEL
4.0000 g | Freq: Four times a day (QID) | TRANSDERMAL | Status: DC
  Administered 2016-05-31: 4 g via TOPICAL
  Filled 2016-05-31: qty 100

## 2016-05-31 MED ORDER — PRAVASTATIN SODIUM 40 MG PO TABS
40.0000 mg | ORAL_TABLET | Freq: Every day | ORAL | Status: DC
  Administered 2016-05-31 – 2016-06-04 (×5): 40 mg via ORAL
  Filled 2016-05-31 (×8): qty 1

## 2016-05-31 MED ORDER — NICOTINE 21 MG/24HR TD PT24
21.0000 mg | MEDICATED_PATCH | Freq: Every day | TRANSDERMAL | Status: DC
  Administered 2016-05-31: 21 mg via TRANSDERMAL
  Filled 2016-05-31: qty 1

## 2016-05-31 MED ORDER — DULOXETINE HCL 60 MG PO CPEP
60.0000 mg | ORAL_CAPSULE | Freq: Every day | ORAL | Status: DC
  Administered 2016-06-01: 60 mg via ORAL
  Filled 2016-05-31 (×4): qty 1

## 2016-05-31 MED ORDER — PANTOPRAZOLE SODIUM 40 MG PO TBEC
40.0000 mg | DELAYED_RELEASE_TABLET | Freq: Every day | ORAL | Status: DC
  Administered 2016-06-01 – 2016-06-04 (×4): 40 mg via ORAL
  Filled 2016-05-31 (×8): qty 1

## 2016-05-31 MED ORDER — INSULIN GLARGINE 100 UNIT/ML ~~LOC~~ SOLN
6.0000 [IU] | Freq: Every day | SUBCUTANEOUS | Status: DC
  Administered 2016-05-31 – 2016-06-02 (×3): 6 [IU] via SUBCUTANEOUS
  Filled 2016-05-31: qty 0.06

## 2016-05-31 MED ORDER — DICLOFENAC SODIUM 1 % TD GEL
4.0000 g | Freq: Four times a day (QID) | TRANSDERMAL | Status: DC
  Administered 2016-05-31 – 2016-06-04 (×5): 4 g via TOPICAL
  Filled 2016-05-31 (×2): qty 100

## 2016-05-31 MED ORDER — CHLORDIAZEPOXIDE HCL 25 MG PO CAPS: 25.0000 mg | ORAL_CAPSULE | Freq: Every day | ORAL | Status: DC

## 2016-05-31 MED ORDER — HYDROXYZINE HCL 10 MG PO TABS
10.0000 mg | ORAL_TABLET | Freq: Three times a day (TID) | ORAL | Status: DC | PRN
  Filled 2016-05-31: qty 1

## 2016-05-31 MED ORDER — VITAMIN B-1 100 MG PO TABS
100.0000 mg | ORAL_TABLET | Freq: Every day | ORAL | Status: DC
  Administered 2016-05-31: 100 mg via ORAL
  Filled 2016-05-31: qty 1

## 2016-05-31 MED ORDER — FOLIC ACID 1 MG PO TABS
1.0000 mg | ORAL_TABLET | Freq: Every day | ORAL | Status: DC
  Administered 2016-05-31: 1 mg via ORAL
  Filled 2016-05-31: qty 1

## 2016-05-31 NOTE — Plan of Care (Signed)
Problem: Safety: Goal: Ability to demonstrate self-control will improve Outcome: Progressing Pt has been in control of his behavior tonight.

## 2016-05-31 NOTE — Tx Team (Signed)
Initial Treatment Plan 05/31/2016 6:32 PM Michael [name] [id number]    PATIENT STRESSORS: Marital or family conflict Substance abuse   PATIENT STRENGTHS: Average or above average intelligence Motivation for treatment/growth Supportive family/friends   PATIENT IDENTIFIED PROBLEMS: Substance Abuse  Others directed violence  "Stop drinking. Period"  "Get mind back on track like I'm suppose to"               DISCHARGE CRITERIA:  Ability to meet basic life and health needs Improved stabilization in mood, thinking, and/or behavior Motivation to continue treatment in a less acute level of care Need for constant or close observation no longer present Verbal commitment to aftercare and medication compliance Withdrawal symptoms are absent or subacute and managed without 24-hour nursing intervention  PRELIMINARY DISCHARGE PLAN: Attend 12-step recovery group Return to previous living arrangement  PATIENT/FAMILY INVOLVEMENT: This treatment plan has been presented to and reviewed with the patient, Michael [name].  The patient and family have been given the opportunity to ask questions and make suggestions.  Dustin Flock, RN 05/31/2016, 6:32 PM

## 2016-05-31 NOTE — ED Notes (Signed)
Bed: [facility] Expected date:  Expected time:  Means of arrival:  Comments: 

## 2016-05-31 NOTE — ED Provider Notes (Signed)
[facility] DEPT Provider Note   CSN: [id number] Arrival date & time: 05/31/16  0051     History   Chief Complaint Chief Complaint  Patient presents with  . Anxiety  . Alcohol Intoxication    HPI Michael [name] is a 61 y.o. male with a hx of DM, chronic alcoholic pancreatitis, seizures presents to the Emergency Department with EtOH intoxication and requesting to be seen by Mental health.  Pt stated to [employer] that "[his] mind is full and [he] has too much hate in [his] heart."  He reports he is mentally ill and has a lot of stress on his mind.  Pt reports he feels suicidal and homicidal.  He reports he made a plan to kill 2 other people, but is unwilling to divulge this plan to me.  He denies having a plan to kill himself.  He reports intermittent voices in his head.  He denies aggravating or alleviating factors.  Pt denies fever, chills, headache, neck pain, chest pain, SOB, abd pain, N/V/D, weakness, dizziness, syncope.      The history is provided by the patient and medical records. No language interpreter was used.    Past Medical History:  Diagnosis Date  . Diabetes mellitus without complication ([place])   . Pancreatitis   . Seizures [facility])     Patient Active Problem List   Diagnosis Date Noted  . Diabetes mellitus due to underlying condition with ketoacidosis without coma, with long-term current use of insulin ([place]) 02/04/2016  . Dry skin 01/18/2016  . Peripheral vascular disease ([place]) 11/15/2015  . Alcohol abuse 09/02/2015  . Tobacco abuse 09/02/2015  . Tricompartment osteoarthritis of both knees 06/15/2015  . Chronic abdominal pain 03/14/2015  . Vitamin D deficiency 03/12/2015  . Hyperlipidemia 02/17/2015  . Fall 12/24/2014  . History of alcohol abuse 12/05/2014  . Protein-calorie malnutrition, severe 11/18/2014  . Cervical arthritis ([place]) 01/01/2014  . Health care maintenance 08/25/2011  . Chronic alcoholic pancreatitis ([place]) 04/20/2011  . Type 2 diabetes mellitus  with peripheral neuropathy ([place]) 12/09/2010  . HTN (hypertension) 12/09/2010    Past Surgical History:  Procedure Laterality Date  . BALLOON DILATION  03/22/2011   Procedure: BALLOON DILATION;  Surgeon: Gatha Mayer, MD;  [facility];  Service: Endoscopy;  Laterality: N/A;  . COLONOSCOPY N/A 05/22/2012   Procedure: COLONOSCOPY;  Surgeon: Gatha Mayer, MD;  Location: [facility];  Service: Endoscopy;  Laterality: N/A;  . COLONOSCOPY W/ BIOPSIES AND POLYPECTOMY  09/12/11  . ESOPHAGOGASTRODUODENOSCOPY  03/22/2011   Procedure: ESOPHAGOGASTRODUODENOSCOPY (EGD);  Surgeon: Gatha Mayer, MD;  Location: [facility];  Service: Endoscopy;  Laterality: N/A;  egd with balloon   . EUS  04/20/2011   Procedure: UPPER ENDOSCOPIC ULTRASOUND (EUS) LINEAR;  Surgeon: Milus Banister, MD;  [facility];  Service: Endoscopy;  Laterality: N/A;       Home Medications    Prior to Admission medications   Medication Sig Start Date End Date Taking? Authorizing Provider  amitriptyline (ELAVIL) 50 MG tablet Take 50 mg by mouth at bedtime. 01/07/16  Yes [provider]  diclofenac sodium (VOLTAREN) 1 % GEL Apply 4 g topically 4 (four) times daily. Patient taking differently: Apply 4 g topically 4 (four) times daily. FOR DIABETIC PAIN 11/15/15  Yes Shela Leff, MD  DULoxetine (CYMBALTA) 60 MG capsule TAKE 1 CAPSULE EVERY DAY 05/16/16  Yes Rice, Resa Miner, MD  folic acid (FOLVITE) 1 MG tablet Take 1 tablet (1 mg total) by mouth  daily. 03/02/16  Yes Rice, Resa Miner, MD  gabapentin (NEURONTIN) 300 MG capsule TAKE 3 CAPSULES BY MOUTH  EVERY MORNING AND  AFTERNOON. TAKE 4 CAPSULES  BY MOUTH AT NIGHT BEFORE  BED 03/10/16  Yes Rice, Resa Miner, MD  hydrOXYzine (ATARAX/VISTARIL) 10 MG tablet Take 1 tablet (10 mg total) by mouth 3 (three) times daily as needed for itching. 03/17/16  Yes Rice, Resa Miner, MD  ibuprofen (ADVIL,MOTRIN) 800 MG tablet Take 1 tablet (800 mg total) by  mouth every 8 (eight) hours as needed for moderate pain. 04/07/16  Yes Milagros Loll, MD  insulin aspart (NOVOLOG FLEXPEN) 100 UNIT/ML FlexPen Inject 8 units under the skin before Supper or the largest meal of the day Patient taking differently: Inject 10 Units into the skin daily before supper. OR LARGEST MEAL OF THE DAY 03/02/16  Yes Rice, Resa Miner, MD  Insulin Detemir (LEVEMIR FLEXTOUCH) 100 UNIT/ML Pen Inject 32 Units into the skin every morning. 03/17/16  Yes Rice, Resa Miner, MD  lipase/protease/amylase (CREON) 36000 UNITS CPEP capsule TAKE 1 CAPSULE BY MOUTH 3  TIMES DAILY BEFORE MEALS 03/02/16  Yes Rice, Resa Miner, MD  loperamide (IMODIUM) 2 MG capsule TAKE 1 CAPSULE BY MOUTH AS  NEEDED FOR DIARRHEA OR  LOOSE STOOLS. NO MORE THAN  8 CAPSULES IN 1 DAY. 03/03/16  Yes Rice, Resa Miner, MD  lovastatin (MEVACOR) 40 MG tablet Take 1 tablet (40 mg total) by mouth at bedtime. 03/02/16  Yes Rice, Resa Miner, MD  pantoprazole (PROTONIX) 40 MG tablet Take 1 tablet (40 mg total) by mouth daily. 03/02/16  Yes Rice, Resa Miner, MD  Vitamin D, Ergocalciferol, (DRISDOL) 50000 units CAPS capsule TAKE 1 CAPSULE BY MOUTH ONCE WEEKLY EVERY SUNDAY 02/14/16  Yes Rice, Resa Miner, MD  ACCU-CHEK FASTCLIX LANCETS MISC Use to check your blood sugar 3 times a day 03/02/16   Collier Salina, MD  Blood Glucose Monitoring Suppl (ACCU-CHEK AVIVA PLUS) w/Device KIT Check blood sugar up to 3 times a day 11/16/15   Jule Ser, DO  glucose blood (ACCU-CHEK AVIVA PLUS) test strip Use to check your blood sugar 3 times a day. Dx code E11.42. Insulin dependent diabetes. 05/08/16   Burns, Arloa Koh, MD  glucose blood (AGAMATRIX PRESTO TEST) test strip Use as instructed 03/23/16   Burgess Estelle, MD  Insulin Pen Needle 31G X 5 MM MISC Use to inject 15 units of Levemir one time a day 09/06/15   Rivet, Sindy Guadeloupe, MD  Lancet Devices [facility]) lancets Use to check your blood sugar 3 times a day. Dx code E11.65.  Insulin dependent diabetes. 03/06/16   Collier Salina, MD    Family History Family History  Problem Relation Age of Onset  . Hypertension Sister   . Diabetes Sister   . Heart disease Sister   . Colon cancer Neg Hx   . Stomach cancer Neg Hx   . Anesthesia problems Neg Hx   . Hypotension Neg Hx   . Pseudochol deficiency Neg Hx   . Malignant hyperthermia Neg Hx     Social History Social History  Substance Use Topics  . Smoking status: Current Every Day Smoker    Packs/day: 0.40    Years: 40.00    Types: Cigarettes  . Smokeless tobacco: Never Used     Comment: 5 cigs per day  . Alcohol use 6.6 oz/week    10 Standard drinks or equivalent, 1 Cans of beer per week  Comment: drinks occassionally     Allergies   Ace inhibitors; Losartan; and Tylenol [acetaminophen]   Review of Systems Review of Systems  Constitutional: Negative for chills and fever.  HENT: Negative for congestion and sore throat.   Respiratory: Negative for shortness of breath.   Cardiovascular: Negative for chest pain.  Gastrointestinal: Negative for abdominal pain, nausea and vomiting.  Skin: Negative for rash.  Psychiatric/Behavioral: Positive for agitation, hallucinations and suicidal ideas.  All other systems reviewed and are negative.    Physical Exam Updated Vital Signs BP (!) 151/92   Pulse 99   Temp 98.1 F (36.7 C) (Oral)   Resp 17   SpO2 94%   Physical Exam  Constitutional: He appears well-developed and well-nourished. No distress.  Awake, alert, nontoxic appearance  HENT:  Head: Normocephalic and atraumatic.  Mouth/Throat: Oropharynx is clear and moist. No oropharyngeal exudate.  Eyes: Conjunctivae are normal. No scleral icterus.  Neck: Normal range of motion. Neck supple.  Cardiovascular: Normal rate, regular rhythm and intact distal pulses.   Pulmonary/Chest: Effort normal and breath sounds normal. No respiratory distress. He has no wheezes.  Equal chest expansion    Abdominal: Soft. Bowel sounds are normal. He exhibits no mass. There is no tenderness. There is no rebound and no guarding.  Musculoskeletal: Normal range of motion. He exhibits no edema.  Neurological: He is alert.  Speech is clear and goal oriented Moves extremities without ataxia  Skin: Skin is warm and dry. He is not diaphoretic.  Psychiatric: His affect is angry. His speech is rapid and/or pressured. He is agitated. He expresses impulsivity. He expresses homicidal and suicidal ideation. He expresses homicidal plans. He expresses no suicidal plans.  Nursing note and vitals reviewed.    ED Treatments / Results  Labs (all labs ordered are listed, but only abnormal results are displayed) Labs Reviewed  COMPREHENSIVE METABOLIC PANEL - Abnormal; Notable for the following:       Result Value   Potassium 3.4 (*)    Glucose, Bld 268 (*)    BUN 5 (*)    Calcium 8.7 (*)    Total Protein 6.3 (*)    AST 115 (*)    ALT 78 (*)    Alkaline Phosphatase 151 (*)    All other components within normal limits  ETHANOL - Abnormal; Notable for the following:    Alcohol, Ethyl (B) 248 (*)    All other components within normal limits  CBC - Abnormal; Notable for the following:    Platelets 113 (*)    All other components within normal limits  RAPID URINE DRUG SCREEN, HOSP PERFORMED  URINALYSIS, ROUTINE W REFLEX MICROSCOPIC    EKG  EKG Interpretation None       Radiology No results found.  Procedures Procedures (including critical care time)  Medications Ordered in ED Medications  LORazepam (ATIVAN) tablet 1 mg (not administered)  ibuprofen (ADVIL,MOTRIN) tablet 600 mg (not administered)  zolpidem (AMBIEN) tablet 5 mg (not administered)  nicotine (NICODERM CQ - dosed in mg/24 hours) patch 21 mg (not administered)  ondansetron (ZOFRAN) tablet 4 mg (not administered)  alum & mag hydroxide-simeth (MAALOX/MYLANTA) 200-200-20 MG/5ML suspension 30 mL (not administered)  LORazepam  (ATIVAN) tablet 0-4 mg (not administered)    Followed by  LORazepam (ATIVAN) tablet 0-4 mg (not administered)  thiamine (VITAMIN B-1) tablet 100 mg (not administered)    Or  thiamine (B-1) injection 100 mg (not administered)  amitriptyline (ELAVIL) tablet 50 mg (not administered)  diclofenac  sodium (VOLTAREN) 1 % transdermal gel 4 g (not administered)  DULoxetine (CYMBALTA) DR capsule 60 mg (not administered)  folic acid (FOLVITE) tablet 1 mg (not administered)  gabapentin (NEURONTIN) capsule 300 mg (not administered)  hydrOXYzine (ATARAX/VISTARIL) tablet 10 mg (not administered)  insulin aspart (NOVOLOG) FlexPen 10 Units (not administered)  lipase/protease/amylase (CREON) capsule 36,000 Units (not administered)  pravastatin (PRAVACHOL) tablet 40 mg (not administered)  pantoprazole (PROTONIX) EC tablet 40 mg (not administered)  sodium chloride 0.9 % bolus 1,000 mL (1,000 mLs Intravenous New Bag/Given 05/31/16 0512)  potassium chloride SA (K-DUR,KLOR-CON) CR tablet 40 mEq (40 mEq Oral Given 05/31/16 6659)     Initial Impression / Assessment and Plan / ED Course  I have reviewed the triage vital signs and the nursing notes.  Pertinent labs & imaging results that were available during my care of the patient were reviewed by me and considered in my medical decision making (see chart for details).     Pt with EtOH abuse.  He arrives intoxicated and c/o SI.  He continues to express this even as he sobers.  Pt given fluids for hyperglycemia. Normal anion gap. EtOH level 248.  Mild hypokalemia, repleated in the department.  AST and ALT elevated, likely secondary to EtOH usage.  Abd is soft and nontender. Pt is not vomiting.  Pt is pending UA and UDs, but is otherwise medically cleared for TTS evaluation.    Final Clinical Impressions(s) / ED Diagnoses   Final diagnoses:  Alcohol abuse  Alcoholic intoxication without complication ([place])  Suicidal ideation  Hyperglycemia    New  Prescriptions New Prescriptions   No medications on file     Agapito Games 05/31/16 [place], MD 06/01/16 [phone]

## 2016-05-31 NOTE — ED Notes (Signed)
Per [employer]- Pt wanted to be seen by mental health. Stated that "his mind is full and he has too much hate in his heart".

## 2016-05-31 NOTE — BH Assessment (Signed)
[facility] Assessment Progress Note  Per Corena Pilgrim, MD, this pt requires psychiatric hospitalization at this time.  Letitia Libra, RN, [facility] has assigned pt to [facility] Rm [id number].  Pt has signed Voluntary Admission and Consent for Treatment, as well as Consent to Release Information to his sister and to [place], and a notification call has been placed to the latter.  Signed forms have been faxed to [facility].  Pt's nurse has been notified, and agrees to send original paperwork along with pt via [place], and to call report to [phone].  Jalene Mullet, [facility] Triage Specialist [phone]

## 2016-05-31 NOTE — ED Notes (Signed)
Pt made aware Urine specimen is needed.
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
## 2016-05-31 NOTE — ED Notes (Signed)
Bed: [facility] Expected date:  Expected time:  Means of arrival:  Comments: 

## 2016-05-31 NOTE — Progress Notes (Signed)
Adult Psychoeducational Group Note  Date:  05/31/2016 Time:  9:39 PM  Group Topic/Focus:  Wrap-Up Group:   The focus of this group is to help patients review their daily goal of treatment and discuss progress on daily workbooks.  Participation Level:  Minimal  Participation Quality:  Appropriate  Affect:  Appropriate  Cognitive:  Alert  Insight: Appropriate  Engagement in Group:  Engaged  Modes of Intervention:  Discussion  Additional Comments:  Pt is new to the unit and stated that his goal is to get clean from alcohol.   Sharmon Revere 05/31/2016, 9:39 PM

## 2016-05-31 NOTE — ED Notes (Signed)
Pt is alert and oriented x 4 and is verbally responsive. Pt reports that he has pain to left lower abdomen 7/10 throbbing.

## 2016-05-31 NOTE — ED Notes (Signed)
[place] with TTS at bedside

## 2016-05-31 NOTE — ED Notes (Signed)
Pt eating meal tray, will recheck CBG after meal.

## 2016-05-31 NOTE — ED Notes (Signed)
Bed: [facility]A Expected date:  Expected time:  Means of arrival:  Comments: 

## 2016-05-31 NOTE — Progress Notes (Addendum)
D: Pt was in the dayroom upon initial approach.  Pt presents with depressed affect and mood.  He reports his goal is to "try to get over with this drinking, stay out of trouble."  Pt denies SI/HI, denies hallucinations, reports generalized pain of 6/10.  Pt has been visible in milieu interacting with peers and staff appropriately.  Pt attended evening group.    A: Introduced self to pt.  Actively listened to pt and offered support and encouragement. Medications administered per order.  HS Novolog coverage not administered per verbal order from on-site provider, Frederico Hamman.  PRN medication offered for pain, pt declined.  Q15 minute safety checks maintained.  R: Pt is safe on the unit.  Pt is compliant with medications.  Pt verbally contracts for safety.  Will continue to monitor and assess.

## 2016-05-31 NOTE — ED Notes (Signed)
Bed: WA06 Expected date:  Expected time:  Means of arrival:  Comments: 
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
## 2016-06-01 DIAGNOSIS — E118 Type 2 diabetes mellitus with unspecified complications: Secondary | ICD-10-CM

## 2016-06-01 DIAGNOSIS — M25569 Pain in unspecified knee: Secondary | ICD-10-CM

## 2016-06-01 DIAGNOSIS — F17210 Nicotine dependence, cigarettes, uncomplicated: Secondary | ICD-10-CM

## 2016-06-01 DIAGNOSIS — R45850 Homicidal ideations: Secondary | ICD-10-CM

## 2016-06-01 DIAGNOSIS — F319 Bipolar disorder, unspecified: Secondary | ICD-10-CM | POA: Diagnosis present

## 2016-06-01 DIAGNOSIS — F310 Bipolar disorder, current episode hypomanic: Secondary | ICD-10-CM

## 2016-06-01 DIAGNOSIS — I10 Essential (primary) hypertension: Secondary | ICD-10-CM

## 2016-06-01 DIAGNOSIS — F1010 Alcohol abuse, uncomplicated: Secondary | ICD-10-CM

## 2016-06-01 LAB — GLUCOSE, CAPILLARY
GLUCOSE-CAPILLARY: 165 mg/dL — AB (ref 65–99)
GLUCOSE-CAPILLARY: 258 mg/dL — AB (ref 65–99)
GLUCOSE-CAPILLARY: 314 mg/dL — AB (ref 65–99)
Glucose-Capillary: 333 mg/dL — ABNORMAL HIGH (ref 65–99)

## 2016-06-01 MED ORDER — ARIPIPRAZOLE 10 MG PO TABS
5.0000 mg | ORAL_TABLET | Freq: Every day | ORAL | Status: DC
  Administered 2016-06-01 – 2016-06-04 (×4): 5 mg via ORAL
  Filled 2016-06-01 (×8): qty 1

## 2016-06-01 NOTE — Progress Notes (Signed)
D: Patient is pleasant with anxious mood.  His affect is flat, blunted; his mood is depressed. Patient's main complaint is "I need to quit drinking."  Patient is interested in long term treatment for his alcohol use.  His goal today is to "stop drinking."  Patient wrote on his self inventory depression is "good" and hopelessness is "good."  Patient is sleeping and eating well; his energy is normal and his concentration is good.  Patient refuses ativan stating, "I don't have any withdrawal symptoms."  "I just need to quit drinking." Patient reports some intermittent passive thoughts of self harm.  He contracts for safety.  He denies HI and does not appear to be responding to internal stimuli.  Patient is compliant with his medications. A: Continue to monitor medication management and MD orders.  Safety checks completed every 15 minutes per protocol.  Offer support and encouragement as needed. R: Patient is receptive to staff; his behavior is appropriate.

## 2016-06-01 NOTE — Social Work (Signed)
Referred to [facility], is [place] resident.  Anne Cunningham, LCSW Lead Clinical Social Worker Phone:  [phone]  
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
## 2016-06-01 NOTE — Progress Notes (Signed)
D: Patient is pleasant upon approach.  He is interacting well his peers.  He has refused to take any ativan for withdrawal symptoms, as he denies any symptoms.  Patient remains angry at sister's boyfriend, however, is not homicidal at this time.  He denies any thoughts of self harm.  Patient has a hx of seizures from withdrawing from alcohol.  He was started on abilify today.  Patient does not appear to be responding to internal stimuli.  Patient has a tendency to isolate to his room.   A: Continue to monitor medication management and MD orders.  Safety checks completed every 15 minutes per protocol.  Offer support and encouragement as needed. R: Patient is receptive to staff; his behavior is appropriate.

## 2016-06-01 NOTE — Tx Team (Signed)
Interdisciplinary Treatment and Diagnostic Plan Update  06/01/2016 Time of Session: [place] MRN: [id number]  Principal Diagnosis: Bipolar disorder [facility])  Secondary Diagnoses: Principal Problem:   Bipolar disorder ([place]) Active Problems:   MDD (major depressive disorder)   Current Medications:  Current Facility-Administered Medications  Medication Dose Route Frequency Provider Last Rate Last Dose  . amitriptyline (ELAVIL) tablet 50 mg  50 mg Oral QHS Kerrie Buffalo, NP   50 mg at 05/31/16 2136  . diclofenac sodium (VOLTAREN) 1 % transdermal gel 4 g  4 g Topical QID Kerrie Buffalo, NP   4 g at 05/31/16 2136  . DULoxetine (CYMBALTA) DR capsule 60 mg  60 mg Oral Daily Kerrie Buffalo, NP   60 mg at 06/01/16 0806  . folic acid (FOLVITE) tablet 1 mg  1 mg Oral Daily Kerrie Buffalo, NP   1 mg at 06/01/16 0807  . gabapentin (NEURONTIN) capsule 300 mg  300 mg Oral BID Kerrie Buffalo, NP   300 mg at 06/01/16 0805  . [START ON 06/03/2016] hydrOXYzine (ATARAX/VISTARIL) tablet 10 mg  10 mg Oral TID PRN Cobos, Myer Peer, MD      . hydrOXYzine (ATARAX/VISTARIL) tablet 25 mg  25 mg Oral Q6H PRN Ethelene Hal, NP      . ibuprofen (ADVIL,MOTRIN) tablet 600 mg  600 mg Oral Q8H PRN Kerrie Buffalo, NP   600 mg at 06/01/16 3716  . insulin aspart (novoLOG) injection 0-5 Units  0-5 Units Subcutaneous QHS Simon, Spencer E, PA-C      . insulin aspart (novoLOG) injection 0-9 Units  0-9 Units Subcutaneous TID WC Patriciaann Clan E, PA-C   5 Units at 06/01/16 1212  . insulin glargine (LANTUS) injection 6 Units  6 Units Subcutaneous Q2200 Laverle Hobby, PA-C   6 Units at 05/31/16 2137  . lipase/protease/amylase (CREON) capsule 36,000 Units  36,000 Units Oral TID Burley Saver, NP   36,000 Units at 06/01/16 1210  . loperamide (IMODIUM) capsule 2-4 mg  2-4 mg Oral PRN Ethelene Hal, NP      . LORazepam (ATIVAN) tablet 1 mg  1 mg Oral Q6H PRN Patriciaann Clan E, PA-C      . magnesium  hydroxide (MILK OF MAGNESIA) suspension 30 mL  30 mL Oral Daily PRN Kerrie Buffalo, NP      . multivitamin with minerals tablet 1 tablet  1 tablet Oral Daily Ethelene Hal, NP   1 tablet at 06/01/16 [id number]  . nicotine (NICODERM CQ - dosed in mg/24 hours) patch 21 mg  21 mg Transdermal Daily Kerrie Buffalo, NP   21 mg at 06/01/16 0807  . ondansetron (ZOFRAN-ODT) disintegrating tablet 4 mg  4 mg Oral Q6H PRN Ethelene Hal, NP      . pantoprazole (PROTONIX) EC tablet 40 mg  40 mg Oral Daily Kerrie Buffalo, NP   40 mg at 06/01/16 0805  . pravastatin (PRAVACHOL) tablet 40 mg  40 mg Oral q1800 Kerrie Buffalo, NP   40 mg at 05/31/16 1719  . thiamine (VITAMIN B-1) tablet 100 mg  100 mg Oral Daily Kerrie Buffalo, NP   100 mg at 06/01/16 9381   Or  . thiamine (B-1) injection 100 mg  100 mg Intravenous Daily Kerrie Buffalo, NP      . traZODone (DESYREL) tablet 50 mg  50 mg Oral QHS PRN Kerrie Buffalo, NP       PTA Medications: Prescriptions Prior to Admission  Medication Sig Dispense Refill Last Dose  .  ACCU-CHEK FASTCLIX LANCETS MISC Use to check your blood sugar 3 times a day 102 each 5   . amitriptyline (ELAVIL) 50 MG tablet Take 50 mg by mouth at bedtime.  0 05/30/2016 at Unknown time  . Blood Glucose Monitoring Suppl (ACCU-CHEK AVIVA PLUS) w/Device KIT Check blood sugar up to 3 times a day 1 kit 1 Taking  . diclofenac sodium (VOLTAREN) 1 % GEL Apply 4 g topically 4 (four) times daily. (Patient taking differently: Apply 4 g topically 4 (four) times daily. FOR DIABETIC PAIN) 100 g 1 05/30/2016 at Unknown time  . DULoxetine (CYMBALTA) 60 MG capsule TAKE 1 CAPSULE EVERY DAY 90 capsule 0 Past Week at Unknown time  . folic acid (FOLVITE) 1 MG tablet Take 1 tablet (1 mg total) by mouth daily. 90 tablet 1 Past Week at Unknown time  . gabapentin (NEURONTIN) 300 MG capsule TAKE 3 CAPSULES BY MOUTH  EVERY MORNING AND  AFTERNOON. TAKE 4 CAPSULES  BY MOUTH AT NIGHT BEFORE  BED 900 capsule 1  05/30/2016 at Unknown time  . glucose blood (ACCU-CHEK AVIVA PLUS) test strip Use to check your blood sugar 3 times a day. Dx code E11.42. Insulin dependent diabetes. 300 each 3   . glucose blood (AGAMATRIX PRESTO TEST) test strip Use as instructed 100 each 12   . hydrOXYzine (ATARAX/VISTARIL) 10 MG tablet Take 1 tablet (10 mg total) by mouth 3 (three) times daily as needed for itching. 30 tablet 1 05/30/2016 at Unknown time  . ibuprofen (ADVIL,MOTRIN) 800 MG tablet Take 1 tablet (800 mg total) by mouth every 8 (eight) hours as needed for moderate pain. 60 tablet 2 Past Week at Unknown time  . insulin aspart (NOVOLOG FLEXPEN) 100 UNIT/ML FlexPen Inject 8 units under the skin before Supper or the largest meal of the day (Patient taking differently: Inject 10 Units into the skin daily before supper. OR LARGEST MEAL OF THE DAY) 15 mL 11 05/30/2016 at Unknown time  . Insulin Detemir (LEVEMIR FLEXTOUCH) 100 UNIT/ML Pen Inject 32 Units into the skin every morning. 45 mL 1 05/30/2016 at Unknown time  . Insulin Pen Needle 31G X 5 MM MISC Use to inject 15 units of Levemir one time a day 100 each 5 Taking  . Lancet Devices (ACCU-CHEK SOFTCLIX) lancets Use to check your blood sugar 3 times a day. Dx code E11.65. Insulin dependent diabetes. 102 each 5   . lipase/protease/amylase (CREON) 36000 UNITS CPEP capsule TAKE 1 CAPSULE BY MOUTH 3  TIMES DAILY BEFORE MEALS 300 capsule 1 05/30/2016 at Unknown time  . loperamide (IMODIUM) 2 MG capsule TAKE 1 CAPSULE BY MOUTH AS  NEEDED FOR DIARRHEA OR  LOOSE STOOLS. NO MORE THAN  8 CAPSULES IN 1 DAY. 180 capsule 2 Past Month at Unknown time  . lovastatin (MEVACOR) 40 MG tablet Take 1 tablet (40 mg total) by mouth at bedtime. 90 tablet 1 05/30/2016 at Unknown time  . pantoprazole (PROTONIX) 40 MG tablet Take 1 tablet (40 mg total) by mouth daily. 90 tablet 1 05/30/2016 at Unknown time  . Vitamin D, Ergocalciferol, (DRISDOL) 50000 units CAPS capsule TAKE 1 CAPSULE BY MOUTH ONCE WEEKLY EVERY  SUNDAY 7 capsule 0 Past Month at Unknown time    Patient Stressors: Marital or family conflict Substance abuse  Patient Strengths: Average or above average intelligence Motivation for treatment/growth Supportive family/friends  Treatment Modalities: Medication Management, Group therapy, Case management,  1 to 1 session with clinician, Psychoeducation, Recreational therapy.   Physician Treatment Plan  for Primary Diagnosis: Bipolar disorder ([place]) Long Term Goal(s):     Short Term Goals:    Medication Management: Evaluate patient's response, side effects, and tolerance of medication regimen.  Therapeutic Interventions: 1 to 1 sessions, Unit Group sessions and Medication administration.  Evaluation of Outcomes: Progressing  Physician Treatment Plan for Secondary Diagnosis: Principal Problem:   Bipolar disorder ([place]) Active Problems:   MDD (major depressive disorder)  Long Term Goal(s):     Short Term Goals:       Medication Management: Evaluate patient's response, side effects, and tolerance of medication regimen.  Therapeutic Interventions: 1 to 1 sessions, Unit Group sessions and Medication administration.  Evaluation of Outcomes: Progressing   RN Treatment Plan for Primary Diagnosis: Bipolar disorder ([place]) Long Term Goal(s): Knowledge of disease and therapeutic regimen to maintain health will improve  Short Term Goals: Ability to remain free from injury will improve, Ability to verbalize feelings will improve and Ability to disclose and discuss suicidal ideas  Medication Management: RN will administer medications as ordered by provider, will assess and evaluate patient's response and provide education to patient for prescribed medication. RN will report any adverse and/or side effects to prescribing provider.  Therapeutic Interventions: 1 on 1 counseling sessions, Psychoeducation, Medication administration, Evaluate responses to treatment, Monitor vital signs and CBGs  as ordered, Perform/monitor CIWA, COWS, AIMS and Fall Risk screenings as ordered, Perform wound care treatments as ordered.  Evaluation of Outcomes: Progressing   LCSW Treatment Plan for Primary Diagnosis: Bipolar disorder ([place]) Long Term Goal(s): Safe transition to appropriate next level of care at discharge, Engage patient in therapeutic group addressing interpersonal concerns.  Short Term Goals: Engage patient in aftercare planning with referrals and resources, Facilitate patient progression through stages of change regarding substance use diagnoses and concerns and Identify triggers associated with mental health/substance abuse issues  Therapeutic Interventions: Assess for all discharge needs, 1 to 1 time with Social worker, Explore available resources and support systems, Assess for adequacy in community support network, Educate family and significant other(s) on suicide prevention, Complete Psychosocial Assessment, Interpersonal group therapy.  Evaluation of Outcomes: Progressing   Progress in Treatment: Attending groups: No. New to unit. Continuing to assess.  Participating in groups: No. Taking medication as prescribed: Yes. Toleration medication: Yes. Family/Significant other contact made: No, will contact:  family member if patient consents Patient understands diagnosis: Yes. Discussing patient identified problems/goals with staff: Yes. Medical problems stabilized or resolved: No. Denies suicidal/homicidal ideation: No. Passive SI/HI. No plan or intent current (sister's boyfriend) Issues/concerns per patient self-inventory: No. Other: n/a   New problem(s) identified: No, Describe:  n/a  New Short Term/Long Term Goal(s): medication stabilization; elimination of SI/HI thoughts; decrease in depressive symptoms; development of comprehensive mental wellness/sobriety plan (pt reports daily alcohol and weekly marijuana use).   Discharge Plan or Barriers: CSW assessing for  appropriate referrals. Pt has been staying with friends the past few days after leaving sister's home due to conflict with her boyfriend. Hx at [facility] but has not been going to appts lately. CSW assessing for appropriate referrals.   Reason for Continuation of Hospitalization: Depression Medication stabilization Suicidal ideation  Estimated Length of Stay: 3-5 days   Attendees: Patient: 06/01/2016 2:47 PM  Physician: Dr. Sanjuana Letters MD 06/01/2016 2:47 PM  Nursing: Rogue Jury RN 06/01/2016 2:47 PM  RN Care Manager: Lars Pinks CM 06/01/2016 2:47 PM  Social Worker: Maxie Better, LCSW 06/01/2016 2:47 PM  Recreational Therapist: Rhunette Croft 06/01/2016 2:47 PM  Other: Herbert Pun  [facility] NP; Samuel Jester NP 06/01/2016 2:47 PM  Other:  06/01/2016 2:47 PM  Other: 06/01/2016 2:47 PM    Scribe for Treatment Team: [place], LCSW 06/01/2016 2:47 PM

## 2016-06-01 NOTE — Progress Notes (Signed)
B[facility] Group Notes:  (Nursing/MHT/Case Management/Adjunct)  Date:  06/01/2016  Time: 2030  Type of Therapy:  wrap up group  Participation Level:  Active  Participation Quality:  Appropriate, Attentive, Sharing and Supportive  Affect:  Appropriate  Cognitive:  Appropriate  Insight:  Improving  Engagement in Group:  Engaged  Modes of Intervention:  Clarification, Education and Support  Summary of Progress/Problems:  Michael [name] S 06/01/2016, 9:50 PM 
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
## 2016-06-01 NOTE — [facility] Group Notes (Signed)
[place] LCSW Group Therapy 06/01/2016 1:15pm  Type of Therapy: Group Therapy- [facility] in Life  Participation Level: Pt was present for the duration of the group. Pt did not participate in the discussion but did listen attentively.    Georga Kaufmann, MSW, LCSWA 06/01/2016 3:56 PM
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
## 2016-06-02 DIAGNOSIS — F1099 Alcohol use, unspecified with unspecified alcohol-induced disorder: Secondary | ICD-10-CM

## 2016-06-02 LAB — GLUCOSE, CAPILLARY
GLUCOSE-CAPILLARY: 188 mg/dL — AB (ref 65–99)
GLUCOSE-CAPILLARY: 322 mg/dL — AB (ref 65–99)
GLUCOSE-CAPILLARY: 217 mg/dL — AB (ref 65–99)
Glucose-Capillary: 336 mg/dL — ABNORMAL HIGH (ref 65–99)

## 2016-06-02 MED ORDER — BIOTENE DRY MOUTH MT LIQD
15.0000 mL | OROMUCOSAL | Status: DC | PRN
  Filled 2016-06-02: qty 15

## 2016-06-02 NOTE — Progress Notes (Signed)
Data. Patient denies SI/HI/AVH. Patient has been in the day room, but he has not been interacting with peers, or staff, unless others initiate interaction. Patient refused his AM medications, stating, "The other nurse gave then to me this morning and I'm nt taking anything until later." Affect is flat and irritable at times. He does not report any goal for today. Action. Emotional support and encouragement offered. Education provided on medication, indications and side effect. Q 15 minute checks done for safety. Response. Safety on the unit maintained through 15 minute checks.  Medications taken as prescribed. Attended groups. Remained calm and appropriate through out shift.

## 2016-06-02 NOTE — Progress Notes (Signed)
[facility] MD Progress Note  06/02/2016 11:34 AM PARKS CZAJKOWSKI  MRN:  [id number] Subjective:  "I feel much more clear today. I was upset with my sister's boyfriend and we got into an argument."  Objective: Pt seen and chart reviewed. Pt is alert/oriented x4, calm, cooperative, and appropriate to situation. Pt denies suicidal/homicidal ideation and psychosis and does not appear to be responding to internal stimuli. Pt reports that he slept well and he is much more calm today. Pt presents as linear, logical, goal-oriented, and with good insight.    Principal Problem: Bipolar disorder ([place]) Diagnosis:   Patient Active Problem List   Diagnosis Date Noted  . Bipolar disorder ([place]) [F31.9] 06/01/2016  . MDD (major depressive disorder) [F32.9] 05/31/2016  . Diabetes mellitus due to underlying condition with ketoacidosis without coma, with long-term current use of insulin ([place]) [E08.10, Z79.4] 02/04/2016  . Dry skin [L85.3] 01/18/2016  . Peripheral vascular disease ([place]) [I73.9] 11/15/2015  . Alcohol abuse [F10.10] 09/02/2015  . Tobacco abuse [Z72.0] 09/02/2015  . Tricompartment osteoarthritis of both knees [M17.0] 06/15/2015  . Chronic abdominal pain [R10.9, G89.29] 03/14/2015  . Vitamin D deficiency [E55.9] 03/12/2015  . Hyperlipidemia [E78.5] 02/17/2015  . Fall [W19.XXXA] 12/24/2014  . History of alcohol abuse [Z87.898] 12/05/2014  . Protein-calorie malnutrition, severe [E43] 11/18/2014  . Alcohol use disorder ([place]) [F10.99]   . Cervical arthritis ([place]) [M46.92] 01/01/2014  . Health care maintenance [Z00.00] 08/25/2011  . Chronic alcoholic pancreatitis ([place]) [K86.0] 04/20/2011  . Type 2 diabetes mellitus with peripheral neuropathy ([place]) [E11.42] 12/09/2010  . HTN (hypertension) [I10] 12/09/2010   Total Time spent with patient: 30 minutes  Past Psychiatric History: see H&P  Past Medical History:  Past Medical History:  Diagnosis Date  . Diabetes mellitus without complication ([place])   .  Pancreatitis   . Seizures ([place])     Past Surgical History:  Procedure Laterality Date  . BALLOON DILATION  03/22/2011   Procedure: BALLOON DILATION;  Surgeon: Gatha Mayer, MD;  Location: [facility];  Service: Endoscopy;  Laterality: N/A;  . COLONOSCOPY N/A 05/22/2012   Procedure: COLONOSCOPY;  Surgeon: Gatha Mayer, MD;  Location: [facility];  Service: Endoscopy;  Laterality: N/A;  . COLONOSCOPY W/ BIOPSIES AND POLYPECTOMY  09/12/11  . ESOPHAGOGASTRODUODENOSCOPY  03/22/2011   Procedure: ESOPHAGOGASTRODUODENOSCOPY (EGD);  Surgeon: Gatha Mayer, MD;  Location: [facility];  Service: Endoscopy;  Laterality: N/A;  egd with balloon   . EUS  04/20/2011   Procedure: UPPER ENDOSCOPIC ULTRASOUND (EUS) LINEAR;  Surgeon: Milus Banister, MD;  Location: [facility];  Service: Endoscopy;  Laterality: N/A;   Family History:  Family History  Problem Relation Age of Onset  . Hypertension Sister   . Diabetes Sister   . Heart disease Sister   . Colon cancer Neg Hx   . Stomach cancer Neg Hx   . Anesthesia problems Neg Hx   . Hypotension Neg Hx   . Pseudochol deficiency Neg Hx   . Malignant hyperthermia Neg Hx    Family Psychiatric  History: see H&P Social History:  History  Alcohol Use  . 6.6 oz/week  . 10 Standard drinks or equivalent, 1 Cans of beer per week    Comment: drinks occassionally     History  Drug Use No    Comment: 11/17/2014 "maybe once/month"    Social History   Social History  . Marital status: Single    Spouse name: N/A  . Number of children: 0  . Years  of education: 11   Occupational History  .    [employer]    worked for 6 years  . 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 Smoking status: Current Every Day Smoker    Packs/day: 0.40    Years: 40.00    Types: Cigarettes  . Smokeless tobacco: Never Used     Comment: 5 cigs per day  . Alcohol use 6.6 oz/week    10 Standard drinks or  equivalent, 1 Cans of beer per week     Comment: drinks occassionally  . Drug use: No     Comment: 11/17/2014 "maybe once/month"  . Sexual activity: Not Asked   Other Topics Concern  . None   Social History Narrative   Has been living at 3 different friend's homes since discharge 01/2011. Was living in the shelter prior to admission. Had previously lived with his niece and other family members but he has gotten kicked out each time.    Additional Social History:                         Sleep: Good  Appetite:  Good  Current Medications: Current Facility-Administered Medications  Medication Dose Route Frequency Provider Last Rate Last Dose  . amitriptyline (ELAVIL) tablet 50 mg  50 mg Oral QHS Kerrie Buffalo, NP   50 mg at 06/01/16 2151  . ARIPiprazole (ABILIFY) tablet 5 mg  5 mg Oral Daily Izediuno, Laruth Bouchard, MD   5 mg at 06/01/16 1601  . diclofenac sodium (VOLTAREN) 1 % transdermal gel 4 g  4 g Topical QID Kerrie Buffalo, NP   4 g at 06/01/16 2151  . folic acid (FOLVITE) tablet 1 mg  1 mg Oral Daily Kerrie Buffalo, NP   1 mg at 06/01/16 0807  . gabapentin (NEURONTIN) capsule 300 mg  300 mg Oral BID Kerrie Buffalo, NP   300 mg at 06/01/16 1718  . [START ON 06/03/2016] hydrOXYzine (ATARAX/VISTARIL) tablet 10 mg  10 mg Oral TID PRN Cobos, Myer Peer, MD      . hydrOXYzine (ATARAX/VISTARIL) tablet 25 mg  25 mg Oral Q6H PRN Ethelene Hal, NP      . ibuprofen (ADVIL,MOTRIN) tablet 600 mg  600 mg Oral Q8H PRN Kerrie Buffalo, NP   600 mg at 06/01/16 1610  . insulin aspart (novoLOG) injection 0-5 Units  0-5 Units Subcutaneous QHS Laverle Hobby, PA-C   4 Units at 06/01/16 2152  . insulin aspart (novoLOG) injection 0-9 Units  0-9 Units Subcutaneous TID WC Patriciaann Clan E, PA-C   2 Units at 06/02/16 [phone]  . insulin glargine (LANTUS) injection 6 Units  6 Units Subcutaneous Q2200 Laverle Hobby, PA-C   6 Units at 06/01/16 2152  . lipase/protease/amylase (CREON) capsule  36,000 Units  36,000 Units Oral TID Burley Saver, NP   36,000 Units at 06/02/16 [phone]  . loperamide (IMODIUM) capsule 2-4 mg  2-4 mg Oral PRN Ethelene Hal, NP      . LORazepam (ATIVAN) tablet 1 mg  1 mg Oral Q6H PRN Patriciaann Clan E, PA-C      . magnesium hydroxide (MILK OF MAGNESIA) suspension 30 mL  30 mL Oral Daily PRN Kerrie Buffalo, NP      . multivitamin with minerals tablet 1 tablet  1 tablet Oral Daily Ethelene Hal, NP   1 tablet  at 06/01/16 0806  . nicotine (NICODERM CQ - dosed in mg/24 hours) patch 21 mg  21 mg Transdermal Daily Kerrie Buffalo, NP   21 mg at 06/01/16 0807  . ondansetron (ZOFRAN-ODT) disintegrating tablet 4 mg  4 mg Oral Q6H PRN Ethelene Hal, NP      . pantoprazole (PROTONIX) EC tablet 40 mg  40 mg Oral Daily Kerrie Buffalo, NP   40 mg at 06/01/16 0805  . pravastatin (PRAVACHOL) tablet 40 mg  40 mg Oral q1800 Kerrie Buffalo, NP   40 mg at 06/01/16 1718  . thiamine (VITAMIN B-1) tablet 100 mg  100 mg Oral Daily Kerrie Buffalo, NP   100 mg at 06/01/16 3428   Or  . thiamine (B-1) injection 100 mg  100 mg Intravenous Daily Kerrie Buffalo, NP      . traZODone (DESYREL) tablet 50 mg  50 mg Oral QHS PRN Kerrie Buffalo, NP   50 mg at 06/01/16 2150    Lab Results:  Results for orders placed or performed during the hospital encounter of 05/31/16 (from the past 48 hour(s))  Glucose, capillary     Status: Abnormal   Collection Time: 05/31/16  4:55 PM  Result Value Ref Range   Glucose-Capillary 237 (H) 65 - 99 mg/dL  Glucose, capillary     Status: Abnormal   Collection Time: 05/31/16  8:31 PM  Result Value Ref Range   Glucose-Capillary 235 (H) 65 - 99 mg/dL  Glucose, capillary     Status: Abnormal   Collection Time: 06/01/16  6:02 AM  Result Value Ref Range   Glucose-Capillary 165 (H) 65 - 99 mg/dL  Glucose, capillary     Status: Abnormal   Collection Time: 06/01/16 12:00 PM  Result Value Ref Range   Glucose-Capillary 258 (H) 65 - 99  mg/dL   Comment 1 Notify RN    Comment 2 Document in Chart   Glucose, capillary     Status: Abnormal   Collection Time: 06/01/16  5:14 PM  Result Value Ref Range   Glucose-Capillary 333 (H) 65 - 99 mg/dL  Glucose, capillary     Status: Abnormal   Collection Time: 06/01/16  8:45 PM  Result Value Ref Range   Glucose-Capillary 314 (H) 65 - 99 mg/dL   Comment 1 Notify RN    Comment 2 Document in Chart   Glucose, capillary     Status: Abnormal   Collection Time: 06/02/16  6:00 AM  Result Value Ref Range   Glucose-Capillary 188 (H) 65 - 99 mg/dL    Blood Alcohol level:  Lab Results  Component Value Date   ETH 248 (H) 05/31/2016   ETH 255 (H) 76/81/1572    Metabolic Disorder Labs: Lab Results  Component Value Date   HGBA1C 11.5 01/14/2016   MPG 349 12/04/2014   MPG 292 11/17/2014   No results found for: PROLACTIN Lab Results  Component Value Date   CHOL 157 01/19/2014   TRIG 154 (H) 01/19/2014   HDL 48 01/19/2014   CHOLHDL 3.3 01/19/2014   VLDL 31 01/19/2014   LDLCALC 78 01/19/2014   LDLCALC  06/14/2012     Comment:       Not calculated due to Triglyceride >400. Suggest ordering Direct LDL (Unit Code: [phone]).   Total Cholesterol/HDL Ratio:CHD Risk                        Coronary Heart Disease Risk Table  Men       Women          1/2 Average Risk              3.4        3.3              Average Risk              5.0        4.4           2X Average Risk              9.6        7.1           3X Average Risk             23.4       11.0 Use the calculated Patient Ratio above and the CHD Risk table  to determine the patient's CHD Risk. ATP III Classification (LDL):       < 100        mg/dL         Optimal      100 - 129     mg/dL         Near or Above Optimal      130 - 159     mg/dL         Borderline High      160 - 189     mg/dL         High       > 190        mg/dL         Very High      Physical Findings: AIMS: Facial  and Oral Movements Muscles of Facial Expression: None, normal Lips and Perioral Area: None, normal Jaw: None, normal Tongue: None, normal,Extremity Movements Upper (arms, wrists, hands, fingers): None, normal Lower (legs, knees, ankles, toes): None, normal, Trunk Movements Neck, shoulders, hips: None, normal, Overall Severity Severity of abnormal movements (highest score from questions above): None, normal Incapacitation due to abnormal movements: None, normal Patient's awareness of abnormal movements (rate only patient's report): No Awareness, Dental Status Current problems with teeth and/or dentures?: Yes Does patient usually wear dentures?: No  CIWA:  CIWA-Ar Total: 1 COWS:     Musculoskeletal: Strength & Muscle Tone: within normal limits Gait & Station: normal Patient leans: N/A  Psychiatric Specialty Exam: Physical Exam  Review of Systems  Psychiatric/Behavioral: Positive for depression and substance abuse. Negative for hallucinations and suicidal ideas. The patient is nervous/anxious and has insomnia.   All other systems reviewed and are negative.   Blood pressure 125/80, pulse (!) 115, temperature 99.1 F (37.3 C), temperature source Oral, resp. rate 16, height 5' 3.25" (1.607 m), weight 65.3 kg (144 lb).Body mass index is 25.31 kg/m.  General Appearance: Casual and Fairly Groomed  Eye Contact:  Good  Speech:  Clear and Coherent and Normal Rate  Volume:  Normal  Mood:  Euthymic  Affect:  Appropriate and Congruent  Thought Process:  Coherent, Goal Directed, Linear and Descriptions of Associations: Intact  Orientation:  Full (Time, Place, and Person)  Thought Content:  Focused on med management  Suicidal Thoughts:  No  Homicidal Thoughts:  No  Memory:  Immediate;   Fair Recent;   Fair Remote;   Fair  Judgement:  Fair  Insight:  Fair  Psychomotor Activity:  Normal  Concentration:  Concentration: Fair and Attention Span: Fair  Recall:  Smiley Houseman of Knowledge:   Fair  Language:  Fair  Akathisia:  No  Handed:    AIMS (if indicated):     Assets:  Communication Skills Desire for Improvement Resilience  ADL's:  Intact  Cognition:  WNL  Sleep:  Number of Hours: 5.25    Treatment Plan Summary: Bipolar disorder ([place]) unstable yet with some improvement in lucidity and thought-direction today, treated as below:  Medications:  -Continue Trazodone 50mg  po qhs prn insomnia -Continue nicotine patch -Continue Vistaril 25mg  po q6h prn anxiety -Continue Abilify 5mg  po daily for mood stabilization/bipolar -Continue Elavil 50mg  po qhs for RLS -Continue Neurontin 300mg  po bid for anxiety -Continue non-psych meds including Lantus 6u qhs for DM2, Creon for hx panc insufficiency, Protonix 40mg  for GERD, Pravachol 40mg  for HLD, Voltaren 1% qid for joint pain   Benjamine Mola, FNP 06/02/2016, 11:34 AM

## 2016-06-02 NOTE — BHH Group Notes (Signed)
[facility] Group Notes:  (Nursing/MHT/Case Management/Adjunct)  Date:  06/02/2016  Time:  11:16 AM  Type of Therapy:  Nurse Education  Participation Level:  None  Participation Quality:  Drowsy and Inattentive  Affect:  Lethargic  Cognitive:  Lacking  Insight:  None  Engagement in Group:  None  Modes of Intervention:  Discussion and Education  Summary of Progress/Problems: Patient slept through out group and did not participate. This group discussed relapse prevention, successful goal setting, and healthy coping skills.   [name] 06/02/2016, 11:16 AM

## 2016-06-02 NOTE — Plan of Care (Signed)
Problem: Activity: Goal: Sleeping patterns will improve Outcome: Progressing Patient was sleeping most of the morning in the dayroom. Has been up throughout the rest of the day.

## 2016-06-02 NOTE — BHH Group Notes (Signed)
[facility] LCSW Group Therapy 06/02/2016 1:15pm  Type of Therapy: Group Therapy- Feelings Around Relapse and Recovery  Participation Level: Pt was present for the duration of group but slept the entire time.    Georga Kaufmann, MSW, Latanya Presser [phone] 06/02/2016 3:12 PM

## 2016-06-02 NOTE — Progress Notes (Signed)
Pt has been observed sitting in the dayroom all evening watching TV.  He has minimal interaction with peers, but is polite and appropriate on the unit.  He denies SI/HI/AVH this evening.  He denies having any withdrawal symptoms.  Pt makes his needs known to staff.  He tells Probation officer that he would like to go to long term treatment for his alcohol abuse and says the CSW is looking for him a place to go.  Pt was also encouraged to eat a healthy diet as his CBG have been trending high.  Support and encouragement offered.  Discharge plans are in process.  Safety maintained with q15 minute checks.

## 2016-06-02 NOTE — BHH Suicide Risk Assessment (Signed)
[facility] INPATIENT:  Family/Significant Other Suicide Prevention Education  Suicide Prevention Education:  Education Completed; [name] (pt's sister) [phone] has been identified by the patient as the family member/significant other with whom the patient will be residing, and identified as the person(s) who will aid the patient in the event of a mental health crisis (suicidal ideations/suicide attempt).  With written consent from the patient, the family member/significant other has been provided the following suicide prevention education, prior to the and/or following the discharge of the patient.  The suicide prevention education provided includes the following:  Suicide risk factors  Suicide prevention and interventions  National Suicide Hotline telephone number  [facility] assessment telephone number  [facility] Emergency Assistance [place] and/or Residential Mobile Crisis Unit telephone number  Request made of family/significant other to:  Remove weapons (e.g., guns, rifles, knives), all items previously/currently identified as safety concern.    Remove drugs/medications (over-the-counter, prescriptions, illicit drugs), all items previously/currently identified as a safety concern.  The family member/significant other verbalizes understanding of the suicide prevention education information provided.  The family member/significant other agrees to remove the items of safety concern listed above.  Morgaine Kimball N Smart LCSW 06/02/2016, 12:43 PM

## 2016-06-02 NOTE — Progress Notes (Signed)
Recreation Therapy Notes  Date: 06/02/16 Time: 0930 Location: 400 Hall Dayroom  Group Topic: Stress Management  Goal Area(s) Addresses:  Patient will verbalize importance of using healthy stress management.  Patient will identify positive emotions associated with healthy stress management.   Intervention: Stress Management  Activity :  Progressive Muscle Relaxation.  LRT introduced the stress management technique of progressive muscle relaxation.  LRT read a script to allow patients to fully participate in the technique.  Patients were to follow along as the script was read to engage in the activity.  Education:  Stress Management, Discharge Planning.   Education Outcome: Acknowledges edcuation/In group clarification offered/Needs additional education  Clinical Observations/Feedback:  Pt did not attend group.    Victorino Sparrow, LRT/CTRS         Victorino Sparrow A 06/02/2016 10:46 AM

## 2016-06-03 DIAGNOSIS — F319 Bipolar disorder, unspecified: Principal | ICD-10-CM

## 2016-06-03 LAB — GLUCOSE, CAPILLARY
GLUCOSE-CAPILLARY: 245 mg/dL — AB (ref 65–99)
GLUCOSE-CAPILLARY: 248 mg/dL — AB (ref 65–99)
GLUCOSE-CAPILLARY: 552 mg/dL — AB (ref 65–99)
GLUCOSE-CAPILLARY: 489 mg/dL — AB (ref 65–99)
GLUCOSE-CAPILLARY: 359 mg/dL — AB (ref 65–99)
Glucose-Capillary: 421 mg/dL — ABNORMAL HIGH (ref 65–99)

## 2016-06-03 MED ORDER — INSULIN ASPART 100 UNIT/ML ~~LOC~~ SOLN
15.0000 [IU] | Freq: Once | SUBCUTANEOUS | Status: AC
  Administered 2016-06-03: 15 [IU] via SUBCUTANEOUS

## 2016-06-03 MED ORDER — GABAPENTIN 300 MG PO CAPS
300.0000 mg | ORAL_CAPSULE | Freq: Three times a day (TID) | ORAL | Status: DC
  Administered 2016-06-03 – 2016-06-04 (×5): 300 mg via ORAL
  Filled 2016-06-03 (×8): qty 1

## 2016-06-03 MED ORDER — INSULIN GLARGINE 100 UNIT/ML ~~LOC~~ SOLN
10.0000 [IU] | Freq: Every day | SUBCUTANEOUS | Status: DC
  Administered 2016-06-03 – 2016-06-04 (×2): 10 [IU] via SUBCUTANEOUS

## 2016-06-03 NOTE — BHH Group Notes (Signed)
Adult Psychoeducational Group Note  Date:  06/03/2016 Time:  1:08 AM  Group Topic/Focus:  Wrap-Up Group:   The focus of this group is to help patients review their daily goal of treatment and discuss progress on daily workbooks.  Participation Level:  Active  Participation Quality:  Appropriate  Affect:  Appropriate  Cognitive:  Alert and Appropriate  Insight: Appropriate  Engagement in Group:  Engaged  Modes of Intervention:  Discussion  Additional Comments:  Pt stated his day was an "8" on a 1-10 scale. Pt is glad to be receiving help. Pt explained his situation and why he is here at [facility]. Pt feels like he is getting great help, just wants to continue to stay positive and continue this positive path once he leaves.   [name] 06/03/2016, 1:08 AM

## 2016-06-03 NOTE — Plan of Care (Signed)
Problem: Safety: Goal: Ability to remain free from injury will improve Outcome: Progressing Patient has remained safe on the unit.

## 2016-06-03 NOTE — Progress Notes (Signed)
Pt has been in the dayroom all evening watching TV.  He does not have much interaction with the other patients, but he is appropriate and has been polite and cooperative with Probation officer.  He denies SI/HI/AVH at this time.  He denies having any withdrawal symptoms.  Pt is diabetic, with CBGs that have fluctuated during the day.  They were elevated all day, but tonight it was 217 which was better than last night.  Pt again was encouraged to try and eat healthy while in the hospital and make healthy food choices in the cafeteria.  Pt voices understanding.  He also reports to Probation officer that his insulins are not right and that is why his CBGs are high.  Pt was encouraged to tell the doctor and NP in the morning.  Pt hopes to discharge to a rehab, stating he is tried of drinking so much.  He is afraid he is going to do something stupid while intoxicated.  Pt makes his needs known to staff.  Support and encouragement offered.  Discharge plans are in process.  Safety maintained with q15 minute checks.

## 2016-06-03 NOTE — Progress Notes (Signed)
Data. Patient denies SI/HI/AVH. Patient again refused to take his AM medications, "Because the earlier nurse gave them to me."  Patient has reported detox symptoms of a runny nose, nausea and anxiety on his self assessment, but denied these symptoms verbally to nurse. Patient interacting well with staff and other patients. Patient was noted putting sugar in his coffee and educated on the importance of a healthy died when you have diabetes. Patient was not receptive to this education, stating, "I will eat what I want. At home my sugars run in the 600s."His blood sugar jumped up to 421 at 5 pm. Recheck showed CBG od 552. NP, Ricky Ala, notified and order received to give patient 15 units on Novolog and to recheck CBG after 1 hour and to notify NP of results. Sliding scale changed and an endocrine consult ordered. Recheck at 6:40 pm 489. NP aware and no new orders received.  Action. Emotional support and encouragement offered. Education provided on medication, indications and side effect. Q 15 minute checks done for safety. Response. Safety on the unit maintained through 15 minute checks.  Medications taken as prescribed. Attended groups. Remained calm and appropriate through out shift.

## 2016-06-03 NOTE — BHH Group Notes (Addendum)
  [facility] LCSW Group Therapy Note  06/03/2016  and  10:00 AM  Type of Therapy and Topic:  Group Therapy: Avoiding Self-Sabotaging and Enabling Behaviors  Participation Level:  Minimal  Participation Quality:  Appropriate and Drowsy  Affect:  Flat  Engagement in Therapy: Limited   Therapeutic models used: Cognitive Behavioral Therapy,  Person-Centered Therapy and Motivational Interviewing  Modes of Intervention:  Discussion, Exploration, Orientation, Rapport Building, Socialization and Support   Summary of Progress/Problems:  The main focus of today's process group was for the patient to identify ways in which they have in the past sabotaged their own recovery. Motivational Interviewing was utilized to identify motivation they may have for wanting to change. Patient appeared asleep for the greatest portion of group yet roused himself and shared first name ''of my only support."  Sheilah Pigeon, LCSW

## 2016-06-03 NOTE — Progress Notes (Signed)
Patient attended group, but did not participate.

## 2016-06-03 NOTE — Progress Notes (Signed)
[facility] MD Progress Note  06/03/2016 2:57 PM Michael [name]  MRN:  [id number]   Subjective: patient reports " I am just not going to talk to my sisters boyfriend. I think that will be best."    Objective: Michael [name] is awake, alert and oriented *3. Seen resting in bedroom. Patient appears slightly irritable today, regarding changing rooms. Patient continues to denies suicidal or homicidal ideation. Denies auditory or visual hallucination and does not appear to be responding to internal stimuli. Reports he has came to an agreement between his sister and sister's boyfriend. (which is where he is living)  Patient reports he is medication compliant without mediation side effects. States his/her depression 2/10. Patient is requesting to be discharged on Wednesday of next week. States this will allow this situation to die down. Reports good appetite and states he is resting well. Support, encouragement and reassurance was provided.    Principal Problem: Bipolar disorder ([place]) Diagnosis:   Patient Active Problem List   Diagnosis Date Noted  . Bipolar disorder ([place]) [F31.9] 06/01/2016  . MDD (major depressive disorder) [F32.9] 05/31/2016  . Diabetes mellitus due to underlying condition with ketoacidosis without coma, with long-term current use of insulin ([place]) [E08.10, Z79.4] 02/04/2016  . Dry skin [L85.3] 01/18/2016  . Peripheral vascular disease ([place]) [I73.9] 11/15/2015  . Alcohol abuse [F10.10] 09/02/2015  . Tobacco abuse [Z72.0] 09/02/2015  . Tricompartment osteoarthritis of both knees [M17.0] 06/15/2015  . Chronic abdominal pain [R10.9, G89.29] 03/14/2015  . Vitamin D deficiency [E55.9] 03/12/2015  . Hyperlipidemia [E78.5] 02/17/2015  . Fall [W19.XXXA] 12/24/2014  . History of alcohol abuse [Z87.898] 12/05/2014  . Protein-calorie malnutrition, severe [E43] 11/18/2014  . Alcohol use disorder ([place]) [F10.99]   . Cervical arthritis ([place]) [M46.92] 01/01/2014  . Health care maintenance [Z00.00]  08/25/2011  . Chronic alcoholic pancreatitis ([place]) [K86.0] 04/20/2011  . Type 2 diabetes mellitus with peripheral neuropathy ([place]) [E11.42] 12/09/2010  . HTN (hypertension) [I10] 12/09/2010   Total Time spent with patient: 30 minutes  Past Psychiatric History: see H&P  Past Medical History:  Past Medical History:  Diagnosis Date  . Diabetes mellitus without complication ([place])   . Pancreatitis   . Seizures ([place])     Past Surgical History:  Procedure Laterality Date  . BALLOON DILATION  03/22/2011   Procedure: BALLOON DILATION;  Surgeon: Gatha Mayer, MD;  Location: [facility];  Service: Endoscopy;  Laterality: N/A;  . COLONOSCOPY N/A 05/22/2012   Procedure: COLONOSCOPY;  Surgeon: Gatha Mayer, MD;  Location: [place];  Service: Endoscopy;  Laterality: N/A;  . COLONOSCOPY W/ BIOPSIES AND POLYPECTOMY  09/12/11  . ESOPHAGOGASTRODUODENOSCOPY  03/22/2011   Procedure: ESOPHAGOGASTRODUODENOSCOPY (EGD);  Surgeon: Gatha Mayer, MD;  Location: [facility];  Service: Endoscopy;  Laterality: N/A;  egd with balloon   . EUS  04/20/2011   Procedure: UPPER ENDOSCOPIC ULTRASOUND (EUS) LINEAR;  Surgeon: Milus Banister, MD;  Location: [facility];  Service: Endoscopy;  Laterality: N/A;   Family History:  Family History  Problem Relation Age of Onset  . Hypertension Sister   . Diabetes Sister   . Heart disease Sister   . Colon cancer Neg Hx   . Stomach cancer Neg Hx   . Anesthesia problems Neg Hx   . Hypotension Neg Hx   . Pseudochol deficiency Neg Hx   . Malignant hyperthermia Neg Hx    Family Psychiatric  History: see H&P Social History:  History  Alcohol Use  . 6.6 oz/week  .  10 Standard drinks or equivalent, 1 Cans of beer per week    Comment: drinks occassionally     History  Drug Use No    Comment: 11/17/2014 "maybe once/month"    Social History   Social History  . Marital status: Single    Spouse name: N/A  . Number of children: 0  . Years of education: 16    Occupational History  .    [employer]    worked for 6 years  . 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 Smoking status: Current Every Day Smoker    Packs/day: 0.40    Years: 40.00    Types: Cigarettes  . Smokeless tobacco: Never Used     Comment: 5 cigs per day  . Alcohol use 6.6 oz/week    10 Standard drinks or equivalent, 1 Cans of beer per week     Comment: drinks occassionally  . Drug use: No     Comment: 11/17/2014 "maybe once/month"  . Sexual activity: Not Asked   Other Topics Concern  . None   Social History Narrative   Has been living at 3 different friend's homes since discharge 01/2011. Was living in the shelter prior to admission. Had previously lived with his niece and other family members but he has gotten kicked out each time.    Additional Social History:                         Sleep: Good  Appetite:  Good  Current Medications: Current Facility-Administered Medications  Medication Dose Route Frequency Provider Last Rate Last Dose  . amitriptyline (ELAVIL) tablet 50 mg  50 mg Oral QHS Kerrie Buffalo, NP   50 mg at 06/02/16 2144  . antiseptic oral rinse (BIOTENE) solution 15 mL  15 mL Mouth Rinse PRN Withrow, Elyse Jarvis, FNP      . ARIPiprazole (ABILIFY) tablet 5 mg  5 mg Oral Daily Izediuno, Laruth Bouchard, MD   5 mg at 06/03/16 1116  . diclofenac sodium (VOLTAREN) 1 % transdermal gel 4 g  4 g Topical QID Kerrie Buffalo, NP   4 g at 49/17/91 5056  . folic acid (FOLVITE) tablet 1 mg  1 mg Oral Daily Kerrie Buffalo, NP   1 mg at 06/03/16 1117  . gabapentin (NEURONTIN) capsule 300 mg  300 mg Oral TID Derrill Center, NP   300 mg at 06/03/16 1117  . hydrOXYzine (ATARAX/VISTARIL) tablet 25 mg  25 mg Oral Q6H PRN Ethelene Hal, NP      . ibuprofen (ADVIL,MOTRIN) tablet 600 mg  600 mg Oral Q8H PRN Kerrie Buffalo, NP   600 mg at 06/03/16 1142  . insulin aspart (novoLOG) injection 0-5  Units  0-5 Units Subcutaneous QHS Laverle Hobby, PA-C   2 Units at 06/02/16 2146  . insulin aspart (novoLOG) injection 0-9 Units  0-9 Units Subcutaneous TID WC Patriciaann Clan E, PA-C   3 Units at 06/03/16 1138  . insulin glargine (LANTUS) injection 6 Units  6 Units Subcutaneous Q2200 Laverle Hobby, PA-C   6 Units at 06/02/16 2145  . lipase/protease/amylase (CREON) capsule 36,000 Units  36,000 Units Oral TID Burley Saver, NP   36,000 Units at 06/03/16 1116  . loperamide (IMODIUM) capsule 2-4 mg  2-4 mg Oral PRN Ethelene Hal, NP      . LORazepam (  ATIVAN) tablet 1 mg  1 mg Oral Q6H PRN Patriciaann Clan E, PA-C      . magnesium hydroxide (MILK OF MAGNESIA) suspension 30 mL  30 mL Oral Daily PRN Kerrie Buffalo, NP      . multivitamin with minerals tablet 1 tablet  1 tablet Oral Daily Ethelene Hal, NP   1 tablet at 06/03/16 1118  . nicotine (NICODERM CQ - dosed in mg/24 hours) patch 21 mg  21 mg Transdermal Daily Kerrie Buffalo, NP   21 mg at 06/03/16 1115  . ondansetron (ZOFRAN-ODT) disintegrating tablet 4 mg  4 mg Oral Q6H PRN Ethelene Hal, NP      . pantoprazole (PROTONIX) EC tablet 40 mg  40 mg Oral Daily Kerrie Buffalo, NP   40 mg at 06/03/16 1116  . pravastatin (PRAVACHOL) tablet 40 mg  40 mg Oral q1800 Kerrie Buffalo, NP   40 mg at 06/02/16 1719  . thiamine (VITAMIN B-1) tablet 100 mg  100 mg Oral Daily Kerrie Buffalo, NP   100 mg at 06/03/16 1118   Or  . thiamine (B-1) injection 100 mg  100 mg Intravenous Daily Kerrie Buffalo, NP      . traZODone (DESYREL) tablet 50 mg  50 mg Oral QHS PRN Kerrie Buffalo, NP   50 mg at 06/02/16 2144    Lab Results:  Results for orders placed or performed during the hospital encounter of 05/31/16 (from the past 48 hour(s))  Glucose, capillary     Status: Abnormal   Collection Time: 06/01/16  5:14 PM  Result Value Ref Range   Glucose-Capillary 333 (H) 65 - 99 mg/dL  Glucose, capillary     Status: Abnormal    Collection Time: 06/01/16  8:45 PM  Result Value Ref Range   Glucose-Capillary 314 (H) 65 - 99 mg/dL   Comment 1 Notify RN    Comment 2 Document in Chart   Glucose, capillary     Status: Abnormal   Collection Time: 06/02/16  6:00 AM  Result Value Ref Range   Glucose-Capillary 188 (H) 65 - 99 mg/dL  Glucose, capillary     Status: Abnormal   Collection Time: 06/02/16 12:12 PM  Result Value Ref Range   Glucose-Capillary 336 (H) 65 - 99 mg/dL  Glucose, capillary     Status: Abnormal   Collection Time: 06/02/16  5:24 PM  Result Value Ref Range   Glucose-Capillary 322 (H) 65 - 99 mg/dL  Glucose, capillary     Status: Abnormal   Collection Time: 06/02/16  8:47 PM  Result Value Ref Range   Glucose-Capillary 217 (H) 65 - 99 mg/dL  Glucose, capillary     Status: Abnormal   Collection Time: 06/03/16  6:12 AM  Result Value Ref Range   Glucose-Capillary 245 (H) 65 - 99 mg/dL  Glucose, capillary     Status: Abnormal   Collection Time: 06/03/16 11:26 AM  Result Value Ref Range   Glucose-Capillary 248 (H) 65 - 99 mg/dL    Blood Alcohol level:  Lab Results  Component Value Date   ETH 248 (H) 05/31/2016   ETH 255 (H) 32/20/2542    Metabolic Disorder Labs: Lab Results  Component Value Date   HGBA1C 11.5 01/14/2016   MPG 349 12/04/2014   MPG 292 11/17/2014   No results found for: PROLACTIN Lab Results  Component Value Date   CHOL 157 01/19/2014   TRIG 154 (H) 01/19/2014   HDL 48 01/19/2014   CHOLHDL 3.3 01/19/2014  VLDL 31 01/19/2014   LDLCALC 78 01/19/2014   LDLCALC  06/14/2012     Comment:       Not calculated due to Triglyceride >400. Suggest ordering Direct LDL (Unit Code: [id number]).   Total Cholesterol/HDL Ratio:CHD Risk                        Coronary Heart Disease Risk Table                                        Men       Women          1/2 Average Risk              3.4        3.3              Average Risk              5.0        4.4           2X Average Risk               9.6        7.1           3X Average Risk             23.4       11.0 Use the calculated Patient Ratio above and the CHD Risk table  to determine the patient's CHD Risk. ATP III Classification (LDL):       < 100        mg/dL         Optimal      100 - 129     mg/dL         Near or Above Optimal      130 - 159     mg/dL         Borderline High      160 - 189     mg/dL         High       > 190        mg/dL         Very High      Physical Findings: AIMS: Facial and Oral Movements Muscles of Facial Expression: None, normal Lips and Perioral Area: None, normal Jaw: None, normal Tongue: None, normal,Extremity Movements Upper (arms, wrists, hands, fingers): None, normal Lower (legs, knees, ankles, toes): None, normal, Trunk Movements Neck, shoulders, hips: None, normal, Overall Severity Severity of abnormal movements (highest score from questions above): None, normal Incapacitation due to abnormal movements: None, normal Patient's awareness of abnormal movements (rate only patient's report): No Awareness, Dental Status Current problems with teeth and/or dentures?: Yes Does patient usually wear dentures?: No  CIWA:  CIWA-Ar Total: 0 COWS:     Musculoskeletal: Strength & Muscle Tone: within normal limits Gait & Station: normal Patient leans: N/A  Psychiatric Specialty Exam: Physical Exam  Vitals reviewed. Constitutional: He is oriented to person, place, and time. He appears well-developed.  HENT:  Head: Normocephalic.  Cardiovascular: Normal rate.   Neurological: He is alert and oriented to person, place, and time.  Psychiatric: He has a normal mood and affect. His behavior is normal.    Review of Systems  Psychiatric/Behavioral: Positive for depression and substance abuse. Negative for hallucinations and suicidal  ideas. The patient is nervous/anxious and has insomnia.   All other systems reviewed and are negative.   Blood pressure 120/88, pulse (!) 105, temperature 98.6  F (37 C), resp. rate 16, height 5' 3.25" (1.607 m), weight 65.3 kg (144 lb).Body mass index is 25.31 kg/m.  General Appearance: Casual and Fairly Groomed  Eye Contact:  Good  Speech:  Clear and Coherent and Normal Rate  Volume:  Normal  Mood:  Euthymic  Affect:  Appropriate and Congruent  Thought Process:  Coherent, Goal Directed, Linear and Descriptions of Associations: Intact  Orientation:  Full (Time, Place, and Person)  Thought Content:  Focused on med management  Suicidal Thoughts:  No  Homicidal Thoughts:  No  Memory:  Immediate;   Fair Recent;   Fair Remote;   Fair  Judgement:  Fair  Insight:  Fair  Psychomotor Activity:  Normal  Concentration:  Concentration: Fair and Attention Span: Fair  Recall:  [employer] of Knowledge:  Fair  Language:  Fair  Akathisia:  No  Handed:    AIMS (if indicated):     Assets:  Communication Skills Desire for Improvement Resilience  ADL's:  Intact  Cognition:  WNL  Sleep:  Number of Hours: 5    I agree with current treatment plan on 06/03/2016, Patient seen face-to-face for psychiatric evaluation follow-up, chart reviewed. Reviewed the information documented and agree with the treatment plan.  Treatment Plan Summary: Bipolar disorder ([place]) unstable   Continue with current treatment plan listed below on 06/03/2016 except where noted   Medications:  -Continue Trazodone 50mg  po qhs prn insomnia -Continue nicotine patch -Continue Vistaril 25mg  po q6h prn anxiety -Continue Abilify 5mg  po daily for mood stabilization/bipolar -Continue Elavil 50mg  po qhs for RLS Increased  Neurontin 300mg  po bid to TID for anxiety -Continue non-psych meds including Lantus 6u qhs for DM2, Creon for hx panc insufficiency, Protonix 40mg  for GERD, Pravachol 40mg  for HLD, Voltaren 1% qid for joint pain  - Initiated endocrine consults for uncontrolled CBG's. Increased Lantus to 10 units QHS  - changed sliding scale to Moderate intensity scale   Derrill Center, NP 06/03/2016, 2:57 PM

## 2016-06-04 ENCOUNTER — Encounter (HOSPITAL_COMMUNITY): Payer: Self-pay | Admitting: Emergency Medicine

## 2016-06-04 LAB — GLUCOSE, CAPILLARY
GLUCOSE-CAPILLARY: 356 mg/dL — AB (ref 65–99)
Glucose-Capillary: 173 mg/dL — ABNORMAL HIGH (ref 65–99)
Glucose-Capillary: 259 mg/dL — ABNORMAL HIGH (ref 65–99)
Glucose-Capillary: 275 mg/dL — ABNORMAL HIGH (ref 65–99)
Glucose-Capillary: 280 mg/dL — ABNORMAL HIGH (ref 65–99)
Glucose-Capillary: 272 mg/dL — ABNORMAL HIGH (ref 65–99)

## 2016-06-04 MED ORDER — LORAZEPAM 1 MG PO TABS: 1.0000 mg | ORAL_TABLET | Freq: Every day | ORAL | Status: DC

## 2016-06-04 MED ORDER — LORAZEPAM 1 MG PO TABS: 1.0000 mg | ORAL_TABLET | Freq: Three times a day (TID) | ORAL | Status: DC

## 2016-06-04 MED ORDER — LORAZEPAM 1 MG PO TABS
1.0000 mg | ORAL_TABLET | Freq: Once | ORAL | Status: AC
  Administered 2016-06-04: 1 mg via ORAL
  Filled 2016-06-04: qty 1

## 2016-06-04 MED ORDER — LORAZEPAM 1 MG PO TABS
1.0000 mg | ORAL_TABLET | Freq: Four times a day (QID) | ORAL | Status: DC
  Administered 2016-06-04: 1 mg via ORAL
  Filled 2016-06-04: qty 1

## 2016-06-04 MED ORDER — HYDROXYZINE HCL 25 MG PO TABS: 25.0000 mg | ORAL_TABLET | Freq: Four times a day (QID) | ORAL | Status: DC | PRN

## 2016-06-04 MED ORDER — LORAZEPAM 1 MG PO TABS: 1.0000 mg | ORAL_TABLET | Freq: Two times a day (BID) | ORAL | Status: DC

## 2016-06-04 MED ORDER — ONDANSETRON 4 MG PO TBDP: 4.0000 mg | ORAL_TABLET | Freq: Four times a day (QID) | ORAL | Status: DC | PRN

## 2016-06-04 NOTE — Progress Notes (Signed)
Pt no longer oriented to place.  He states he is "by the Maken[facility] Messman."  Pt got his belongings from his room, reporting he is leaving tonight.  Pt informed by another RN that MD has to discharge pt.  Pt returned his belongings to his room.  On-site provider assessed pt due to pt's confusion.  Pt to be transferred to ED for further evaluation and treatment per on-site provider.

## 2016-06-04 NOTE — [facility] Group Notes (Signed)
[facility] LCSW Group Therapy  06/04/2016  10-11 AM  Type of Therapy:  Group Therapy  Participation Level:  Active  Participation Quality:  Intrusive and Inattentive  Affect:  Anxious  Cognitive:  Alert  Insight:  None shared  Engagement in Therapy:  None noted  Modes of Intervention:  Discussion, Exploration, Rapport Building, Socialization and Support  Summary of Progress/Problems: Topic for today was thoughts and feelings regarding discharge. We discussed fears of upcoming changes including judgements, expectations and stigma of mental health issues. We then discussed supports: what constitutes a supportive framework, identification of supports and what to do when others are not supportive. Patient was alert at start of group yet dozed off and spilt coffee on self and a peer. Patient was obsessed with cleaning up spill to point facilitator was concerned he would fall thus multiple redirections were given with patient leaving group in a huff not to return.   Sheilah Pigeon, LCSW

## 2016-06-04 NOTE — ED Triage Notes (Signed)
Sent from [facility] for lab work.  Per sitter patient has been more confused since last night.  They felt he may need ammonia level checked.  Patient reporting he has pancreatitis and pain in his abdomen.  Rambling on and on about how we are crazy.

## 2016-06-04 NOTE — ED Provider Notes (Signed)
[facility] DEPT Provider Note   CSN: [id number] Arrival date & time: 06/04/16  2326  By signing my name below, I, Oleh Genin, attest that this documentation has been prepared under the direction and in the presence of Beyonce Sawatzky, Gwenyth Allegra, *. Electronically Signed: Oleh Genin, Scribe. 06/04/16. 11:42 PM.   History   Chief Complaint Chief Complaint  Patient presents with  . Altered Mental Status   LEVEL 5 CAVEAT DUE TO ALTERED MENTAL STATUS  HPI Michael [name] is a 61 y.o. male with history of IDDM, alcoholic pancreatitis, and seizures who presents to the ED with altered mental status. This patient is currently an inpatient at [facility] since 3 days ago. He reportedly had alcohol just prior to admission at that facility. Today he began vocalizing inappropriate statements. He was given 50m of Ativan pre-hospital [place]. Transferred to this facility with concern for DT.   When speaking to the patient, he states that he was "across town at his friends house" and is here today because "his stomach hurts".  The history is provided by the EMS personnel and medical records. No language interpreter was used.    Past Medical History:  Diagnosis Date  . Diabetes mellitus without complication ([place]   . Pancreatitis   . Seizures ([facility]     Patient Active Problem List   Diagnosis Date Noted  . Bipolar disorder ([place] 06/01/2016  . MDD (major depressive disorder) 05/31/2016  . Diabetes mellitus due to underlying condition with ketoacidosis without coma, with long-term current use of insulin ([place] 02/04/2016  . Dry skin 01/18/2016  . Peripheral vascular disease ([place] 11/15/2015  . Alcohol abuse 09/02/2015  . Tobacco abuse 09/02/2015  . Tricompartment osteoarthritis of both knees 06/15/2015  . Chronic abdominal pain 03/14/2015  . Vitamin D deficiency 03/12/2015  . Hyperlipidemia 02/17/2015  . Fall 12/24/2014  . History  of alcohol abuse 12/05/2014  . Protein-calorie malnutrition, severe 11/18/2014  . Alcohol use disorder ([place]   . Cervical arthritis ([place] 01/01/2014  . Health care maintenance 08/25/2011  . Chronic alcoholic pancreatitis ([place] 04/20/2011  . Type 2 diabetes mellitus with peripheral neuropathy ([place] 12/09/2010  . HTN (hypertension) 12/09/2010    Past Surgical History:  Procedure Laterality Date  . BALLOON DILATION  03/22/2011   Procedure: BALLOON DILATION;  Surgeon: CGatha Mayer MD;  [facility];  Service: Endoscopy;  Laterality: N/A;  . COLONOSCOPY N/A 05/22/2012   Procedure: COLONOSCOPY;  Surgeon: CGatha Mayer MD;  [place]  Service: Endoscopy;  Laterality: N/A;  . COLONOSCOPY W/ BIOPSIES AND POLYPECTOMY  09/12/11  . ESOPHAGOGASTRODUODENOSCOPY  03/22/2011   Procedure: ESOPHAGOGASTRODUODENOSCOPY (EGD);  Surgeon: CGatha Mayer MD;  [facility];  Service: Endoscopy;  Laterality: N/A;  egd with balloon   . EUS  04/20/2011   Procedure: UPPER ENDOSCOPIC ULTRASOUND (EUS) LINEAR;  Surgeon: DMilus Banister MD;  Location[facility];  Service: Endoscopy;  Laterality: N/A;       Home Medications    Prior to Admission medications   Medication Sig Start Date End Date Taking? Authorizing Provider  ACCU-CHEK FASTCLIX LANCETS MISC Use to check your blood sugar 3 times a day 03/02/16   RCollier Salina MD  amitriptyline (ELAVIL) 50 MG tablet Take 50 mg by mouth at bedtime. 01/07/16   [provider]  Blood Glucose Monitoring Suppl (ACCU-CHEK AVIVA PLUS) w/Device KIT Check blood sugar up to 3 times a day 11/16/15   WJuleen China  Mitzi Hansen, DO  diclofenac sodium (VOLTAREN) 1 % GEL Apply 4 g topically 4 (four) times daily. Patient taking differently: Apply 4 g topically 4 (four) times daily. FOR DIABETIC PAIN 11/15/15   Shela Leff, MD  DULoxetine (CYMBALTA) 60 MG capsule TAKE 1 CAPSULE EVERY DAY 05/16/16   Rice, Resa Miner, MD  folic acid  (FOLVITE) 1 MG tablet Take 1 tablet (1 mg total) by mouth daily. 03/02/16   Rice, Resa Miner, MD  gabapentin (NEURONTIN) 300 MG capsule TAKE 3 CAPSULES BY MOUTH  EVERY MORNING AND  AFTERNOON. TAKE 4 CAPSULES  BY MOUTH AT NIGHT BEFORE  BED 03/10/16   Collier Salina, MD  glucose blood (ACCU-CHEK AVIVA PLUS) test strip Use to check your blood sugar 3 times a day. Dx code E11.42. Insulin dependent diabetes. 05/08/16   Burns, Arloa Koh, MD  glucose blood (AGAMATRIX PRESTO TEST) test strip Use as instructed 03/23/16   Burgess Estelle, MD  hydrOXYzine (ATARAX/VISTARIL) 10 MG tablet Take 1 tablet (10 mg total) by mouth 3 (three) times daily as needed for itching. 03/17/16   Rice, Resa Miner, MD  ibuprofen (ADVIL,MOTRIN) 800 MG tablet Take 1 tablet (800 mg total) by mouth every 8 (eight) hours as needed for moderate pain. 04/07/16   Milagros Loll, MD  insulin aspart (NOVOLOG FLEXPEN) 100 UNIT/ML FlexPen Inject 8 units under the skin before Supper or the largest meal of the day Patient taking differently: Inject 10 Units into the skin daily before supper. OR LARGEST MEAL OF THE DAY 03/02/16   Collier Salina, MD  Insulin Detemir (LEVEMIR FLEXTOUCH) 100 UNIT/ML Pen Inject 32 Units into the skin every morning. 03/17/16   Rice, Resa Miner, MD  Insulin Pen Needle 31G X 5 MM MISC Use to inject 15 units of Levemir one time a day 09/06/15   Rivet, Sindy Guadeloupe, MD  Lancet Devices [facility]) lancets Use to check your blood sugar 3 times a day. Dx code E11.65. Insulin dependent diabetes. 03/06/16   Collier Salina, MD  lipase/protease/amylase (CREON) 36000 UNITS CPEP capsule TAKE 1 CAPSULE BY MOUTH 3  TIMES DAILY BEFORE MEALS 03/02/16   Rice, Resa Miner, MD  loperamide (IMODIUM) 2 MG capsule TAKE 1 CAPSULE BY MOUTH AS  NEEDED FOR DIARRHEA OR  LOOSE STOOLS. NO MORE THAN  8 CAPSULES IN 1 DAY. 03/03/16   Rice, Resa Miner, MD  lovastatin (MEVACOR) 40 MG tablet Take 1 tablet (40 mg total) by mouth at bedtime.  03/02/16   Collier Salina, MD  pantoprazole (PROTONIX) 40 MG tablet Take 1 tablet (40 mg total) by mouth daily. 03/02/16   Collier Salina, MD  Vitamin D, Ergocalciferol, (DRISDOL) 50000 units CAPS capsule TAKE 1 CAPSULE BY MOUTH ONCE WEEKLY EVERY SUNDAY 02/14/16   Rice, Resa Miner, MD    Family History Family History  Problem Relation Age of Onset  . Hypertension Sister   . Diabetes Sister   . Heart disease Sister   . Colon cancer Neg Hx   . Stomach cancer Neg Hx   . Anesthesia problems Neg Hx   . Hypotension Neg Hx   . Pseudochol deficiency Neg Hx   . Malignant hyperthermia Neg Hx     Social History Social History  Substance Use Topics  . Smoking status: Current Every Day Smoker    Packs/day: 0.40    Years: 40.00    Types: Cigarettes  . Smokeless tobacco: Never Used     Comment: 5 cigs per day  .  Alcohol use 6.6 oz/week    10 Standard drinks or equivalent, 1 Cans of beer per week     Comment: drinks occassionally     Allergies   Ace inhibitors; Losartan; and Tylenol [acetaminophen]   Review of Systems Review of Systems  Unable to perform ROS: Mental status change     Physical Exam Updated Vital Signs BP 124/84   Pulse (!) 125   Temp 97.4 F (36.3 C) (Oral)   Resp 16   Ht _0  (1.626 m)   Wt 142 lb (64.4 kg)   SpO2 100%   BMI 24.37 kg/m   Physical Exam  Constitutional: He appears well-developed and well-nourished. No distress.  HENT:  Head: Normocephalic and atraumatic.  Right Ear: Hearing normal.  Left Ear: Hearing normal.  Nose: Nose normal.  Mouth/Throat: Oropharynx is clear and moist and mucous membranes are normal.  Eyes: Conjunctivae and EOM are normal. Pupils are equal, round, and reactive to light.  Neck: Normal range of motion. Neck supple.  Cardiovascular: Regular rhythm, S1 normal and S2 normal.  Exam reveals no gallop and no friction rub.   No murmur heard. Pulmonary/Chest: Effort normal and breath sounds normal. No respiratory  distress. He exhibits no tenderness.  Abdominal: Soft. Normal appearance and bowel sounds are normal. There is no hepatosplenomegaly. There is no tenderness. There is no rebound, no guarding, no tenderness at McBurney's point and negative Murphy's sign. No hernia.  Musculoskeletal: Normal range of motion.  Skin: Skin is warm, dry and intact. No rash noted. No cyanosis.  Psychiatric: He has a normal mood and affect. His speech is normal and behavior is normal. Thought content normal.  Nursing note and vitals reviewed.    ED Treatments / Results  Labs (all labs ordered are listed, but only abnormal results are displayed) Labs Reviewed  GLUCOSE, CAPILLARY - Abnormal; Notable for the following:       Result Value   Glucose-Capillary 237 (*)    All other components within normal limits  GLUCOSE, CAPILLARY - Abnormal; Notable for the following:    Glucose-Capillary 235 (*)    All other components within normal limits  GLUCOSE, CAPILLARY - Abnormal; Notable for the following:    Glucose-Capillary 165 (*)    All other components within normal limits  GLUCOSE, CAPILLARY - Abnormal; Notable for the following:    Glucose-Capillary 258 (*)    All other components within normal limits  GLUCOSE, CAPILLARY - Abnormal; Notable for the following:    Glucose-Capillary 333 (*)    All other components within normal limits  GLUCOSE, CAPILLARY - Abnormal; Notable for the following:    Glucose-Capillary 314 (*)    All other components within normal limits  GLUCOSE, CAPILLARY - Abnormal; Notable for the following:    Glucose-Capillary 188 (*)    All other components within normal limits  GLUCOSE, CAPILLARY - Abnormal; Notable for the following:    Glucose-Capillary 336 (*)    All other components within normal limits  GLUCOSE, CAPILLARY - Abnormal; Notable for the following:    Glucose-Capillary 322 (*)    All other components within normal limits  GLUCOSE, CAPILLARY - Abnormal; Notable for the  following:    Glucose-Capillary 217 (*)    All other components within normal limits  GLUCOSE, CAPILLARY - Abnormal; Notable for the following:    Glucose-Capillary 245 (*)    All other components within normal limits  GLUCOSE, CAPILLARY - Abnormal; Notable for the following:    Glucose-Capillary 248 (*)  All other components within normal limits  GLUCOSE, CAPILLARY - Abnormal; Notable for the following:    Glucose-Capillary 421 (*)    All other components within normal limits  GLUCOSE, CAPILLARY - Abnormal; Notable for the following:    Glucose-Capillary 552 (*)    All other components within normal limits  GLUCOSE, CAPILLARY - Abnormal; Notable for the following:    Glucose-Capillary 489 (*)    All other components within normal limits  GLUCOSE, CAPILLARY - Abnormal; Notable for the following:    Glucose-Capillary 359 (*)    All other components within normal limits  GLUCOSE, CAPILLARY - Abnormal; Notable for the following:    Glucose-Capillary 173 (*)    All other components within normal limits  GLUCOSE, CAPILLARY - Abnormal; Notable for the following:    Glucose-Capillary 259 (*)    All other components within normal limits  GLUCOSE, CAPILLARY - Abnormal; Notable for the following:    Glucose-Capillary 356 (*)    All other components within normal limits  GLUCOSE, CAPILLARY - Abnormal; Notable for the following:    Glucose-Capillary 275 (*)    All other components within normal limits  GLUCOSE, CAPILLARY - Abnormal; Notable for the following:    Glucose-Capillary 280 (*)    All other components within normal limits  GLUCOSE, CAPILLARY - Abnormal; Notable for the following:    Glucose-Capillary 272 (*)    All other components within normal limits  CBC WITH DIFFERENTIAL/PLATELET - Abnormal; Notable for the following:    Platelets 109 (*)    All other components within normal limits  URINALYSIS, ROUTINE W REFLEX MICROSCOPIC - Abnormal; Notable for the following:     Glucose, UA >=500 (*)    Leukocytes, UA SMALL (*)    Squamous Epithelial / LPF 0-5 (*)    All other components within normal limits  AMMONIA - Abnormal; Notable for the following:    Ammonia 69 (*)    All other components within normal limits  RAPID URINE DRUG SCREEN, HOSP PERFORMED - Abnormal; Notable for the following:    Benzodiazepines POSITIVE (*)    All other components within normal limits  PROTIME-INR  COMPREHENSIVE METABOLIC PANEL  I-STAT TROPOININ, ED    EKG  EKG Interpretation  Date/Time:  Monday Jun 05 2016 02:20:55 EDT Ventricular Rate:  114 PR Interval:    QRS Duration: 72 QT Interval:  335 QTC Calculation: 462 R Axis:   55 Text Interpretation:  Sinus tachycardia Borderline repolarization abnormality Confirmed by Janiyla Long  MD, Juelz Whittenberg [phone]) on 06/05/2016 2:32:09 AM       Radiology Ct Head Wo Contrast  Result Date: 06/05/2016 CLINICAL DATA:  Confusion since last evening EXAM: CT HEAD WITHOUT CONTRAST TECHNIQUE: Contiguous axial images were obtained from the base of the skull through the vertex without intravenous contrast. COMPARISON:  12/03/2014 FINDINGS: BRAIN: There is sulcal and ventricular prominence consistent with superficial and central atrophy. No intraparenchymal hemorrhage, mass effect nor midline shift. Periventricular and subcortical white matter hypodensities consistent with chronic small vessel ischemic disease are identified. No acute large vascular territory infarcts. No abnormal extra-axial fluid collections. Basal cisterns are not effaced and midline. VASCULAR: Moderate calcific atherosclerosis of the carotid siphons. SKULL: No skull fracture. No significant scalp soft tissue swelling. SINUSES/ORBITS: The mastoid air-cells are clear. The included paranasal sinuses are well-aerated.The included ocular globes and orbital contents are non-suspicious. OTHER: None. IMPRESSION: Chronic small vessel ischemic disease of periventricular and subcortical  white matter. Cerebral atrophy. Electronically Signed   By: Shanon Brow  Randel Pigg M.D.   On: 06/05/2016 02:10    Procedures Procedures (including critical care time)  Medications Ordered in ED Medications  magnesium hydroxide (MILK OF MAGNESIA) suspension 30 mL (not administered)  traZODone (DESYREL) tablet 50 mg (50 mg Oral Given 06/02/16 2144)  thiamine (VITAMIN B-1) tablet 100 mg (100 mg Oral Given 06/04/16 0940)    Or  thiamine (B-1) injection 100 mg ( Intravenous See Alternative 06/04/16 0940)  ibuprofen (ADVIL,MOTRIN) tablet 600 mg (600 mg Oral Given 06/03/16 1142)  nicotine (NICODERM CQ - dosed in mg/24 hours) patch 21 mg (21 mg Transdermal Patch Removed 06/05/16 0759)  amitriptyline (ELAVIL) tablet 50 mg (50 mg Oral Given 06/04/16 2106)  diclofenac sodium (VOLTAREN) 1 % transdermal gel 4 g (4 g Topical Given 05/26/86 8280)  folic acid (FOLVITE) tablet 1 mg (1 mg Oral Given 06/04/16 1202)  lipase/protease/amylase (CREON) capsule 36,000 Units (36,000 Units Oral Given 06/04/16 1623)  pantoprazole (PROTONIX) EC tablet 40 mg (40 mg Oral Given 06/04/16 0940)  pravastatin (PRAVACHOL) tablet 40 mg (40 mg Oral Given 06/04/16 1819)  multivitamin with minerals tablet 1 tablet (1 tablet Oral Given 06/04/16 0940)  hydrOXYzine (ATARAX/VISTARIL) tablet 25 mg (not administered)  loperamide (IMODIUM) capsule 2-4 mg (not administered)  ondansetron (ZOFRAN-ODT) disintegrating tablet 4 mg (not administered)  LORazepam (ATIVAN) tablet 1 mg (1 mg Oral Given 06/04/16 1623)  insulin aspart (novoLOG) injection 0-5 Units (3 Units Subcutaneous Given 06/04/16 2105)  insulin aspart (novoLOG) injection 0-9 Units (5 Units Subcutaneous Given 06/04/16 1726)  ARIPiprazole (ABILIFY) tablet 5 mg (5 mg Oral Given 06/04/16 0940)  antiseptic oral rinse (BIOTENE) solution 15 mL (not administered)  gabapentin (NEURONTIN) capsule 300 mg (300 mg Oral Given 06/04/16 1623)  insulin glargine (LANTUS) injection 10 Units (10 Units Subcutaneous Given  06/04/16 2104)  hydrOXYzine (ATARAX/VISTARIL) tablet 25 mg (not administered)  ondansetron (ZOFRAN-ODT) disintegrating tablet 4 mg (not administered)  LORazepam (ATIVAN) tablet 1 mg (1 mg Oral Given 06/04/16 2148)    Followed by  LORazepam (ATIVAN) tablet 1 mg (not administered)    Followed by  LORazepam (ATIVAN) tablet 1 mg (not administered)    Followed by  LORazepam (ATIVAN) tablet 1 mg (not administered)  LORazepam (ATIVAN) injection 0-4 mg (4 mg Intravenous Given 06/05/16 0043)    Followed by  LORazepam (ATIVAN) injection 0-4 mg (not administered)  insulin aspart (novoLOG) injection 15 Units (15 Units Subcutaneous Given 06/03/16 1742)  LORazepam (ATIVAN) tablet 1 mg (1 mg Oral Given 06/04/16 1949)  sodium chloride 0.9 % bolus 1,000 mL (1,000 mLs Intravenous New Bag/Given 06/05/16 0054)  LORazepam (ATIVAN) injection 2 mg (2 mg Intravenous Given 06/05/16 0128)     Initial Impression / Assessment and Plan / ED Course  I have reviewed the triage vital signs and the nursing notes.  Pertinent labs & imaging results that were available during my care of the patient were reviewed by me and considered in my medical decision making (see chart for details).     Patient referred to the emergency department from behavioral health for evaluation of sudden onset of confusion. Patient apparently does have a long alcohol abuse history. He has been admitted to behavioral health for major depressive disorder for several days. At arrival to the ER, patient appears to be in acute alcohol withdrawal with delirium and confusion. No clear signs of more acute encephalopathy, but would also be considered. Patient understood IV thiamine and fluids. CIWA score was 29, administered IV Ativan. Workup otherwise was unremarkable. Withdrawal too severe to  return to behavioral health, will admit to internal medicine.  Final Clinical Impressions(s) / ED Diagnoses   Final diagnoses:  DTs (delirium tremens) ([place])  Alcohol  withdrawal syndrome, with delirium ([place])    New Prescriptions New Prescriptions   No medications on file  I personally performed the services described in this documentation, which was scribed in my presence. The recorded information has been reviewed and is accurate.    Orpah Greek, MD 06/05/16 [phone]

## 2016-06-04 NOTE — Progress Notes (Signed)
Report called to charge nurse, Gerald Stabs, at [facility].  Pt transferring with staff member via [place].

## 2016-06-04 NOTE — Progress Notes (Signed)
Nursing Progress Note: 7p-7a D: Pt currently presents with a pleasant/silly affect and behavior. Pt states "I don't think they got my insulin right it feels weird." Interacting appropriately with milieu. Pt reports ok sleep with current medication regimen.   A: Pt provided with medications per providers orders. Pt's labs and vitals were monitored throughout the night. Pt supported emotionally and encouraged to express concerns and questions. Pt educated on medications.  R: Pt's safety ensured with 15 minute and environmental checks. Pt currently denies SI/HI/Self Harm and AVH. Pt verbally contracts to seek staff if SI/HI or A/VH occurs and to consult with staff before acting on any harmful thoughts. Will continue to monitor.

## 2016-06-04 NOTE — Progress Notes (Signed)
Data. Patient denies/HI/AVH. Endorses passive SI on his self assessment. Is able to verbally contract for safety on the unit and to come to staff if his thoughts/feelings get to a point where he feels as if he may act on them. Some confusion and bizarre behavior noted, in the afternoon. Patient fell asleep a number of times with coffee in his hand and spilt the coffee on the floor. He would eat a small amount of his food, then threw it across the room, for no apparent reason. Patient was not upset at this time. Patient tried to call his cell phone with the hall phone, "To see if I can find it." he has become increasingly irritated throughout the day. Patient also came out to the nurses station and stated, "You need to stop talking about me and asking me how I am. I am just fine." VS pulse 121 and BP 130/74 and CBG  . Pulse was elevated and CBG was 275 at 14:08. Oriented to person, season, situation but not day or specific hospital, though he does know he is,"In the hospital, [facility]." NP, Ricky Ala notified of change of behavior. Staff will continue close observation. Ativan given. Pulse reevaluated and pule decreased to 116. BP 128/75. Temp 97.7. Will continue close monitoring. Action. Emotional support and encouragement offered. Education provided on medication, indications and side effect. Q 15 minute checks done for safety. Response. Safety on the unit maintained through 15 minute checks.  Medications taken as prescribed. Attended groups. Remained calm  through out shift.

## 2016-06-04 NOTE — Progress Notes (Signed)
D: Pt presents with labile affect and mood.  He is agitated at beginning of shift, yelling at peer in dayroom.  He is oriented to person and place, disoriented to day by 2 days.  Pt is tremulous and anxious.  He states "I don't like nobody telling me what to do, I'm a grown man."  Speech is tangential.  He initially denies having any hallucinations, then reports "I saw something out the corner of my eye when I was watching TV earlier."  Pt reports "I went to check, and wasn't nothing there."  He reports generalized pain of 7/10.  Pt denies auditory hallucinations, denies SI/HI.  Pt has been visible in milieu.  He paces hallway at times.  He attended evening group.    A: Introduced self to pt.  Met with pt 1:1 and offered support and encouragement.  On-site provider notified pt was drinking alcohol daily prior to admission.  Notified of pt's behaviors.  Ativan 1 mg POX1 ordered and administered, pt started on Ativan protocol.  Medication administered per order.  Actively listened to pt.  Redirected pt as needed.  Medication education provided.  Q15 minute safety checks maintained.    R: Pt is compliant with medications.  He verbally contracts for safety.  Pt reports he will inform staff of needs and concerns.  Will continue to monitor and assess.

## 2016-06-04 NOTE — Progress Notes (Signed)
[facility] MD Progress Note  06/04/2016 9:54 AM Michael [name]  MRN:  [id number]   Subjective: patient reports " I am feeling fine, I feel ready to discharge." patient is inquiring about talking to case management.  Objective: Michael [name] is awake, alert and oriented to person and place. Seen resting in dayroom interacting with peers. Patient still  appears slightly irritable with some tangential thoughts.  Patient continues to denies suicidal or homicidal ideation. Denies auditory or visual hallucination and does not appear to be responding to internal stimuli. CBG has improved.limited insite regarding diabetes care and management.   Patient reports he is medication compliant without mediation side effects.  States his depression 5/10. Reports good appetite and states he is resting well now that his room assignment has changed. Support, encouragement and reassurance was provided.    Principal Problem: Bipolar disorder ([place]) Diagnosis:   Patient Active Problem List   Diagnosis Date Noted  . Bipolar disorder ([place]) [F31.9] 06/01/2016  . MDD (major depressive disorder) [F32.9] 05/31/2016  . Diabetes mellitus due to underlying condition with ketoacidosis without coma, with long-term current use of insulin ([place]) [E08.10, Z79.4] 02/04/2016  . Dry skin [L85.3] 01/18/2016  . Peripheral vascular disease ([place]) [I73.9] 11/15/2015  . Alcohol abuse [F10.10] 09/02/2015  . Tobacco abuse [Z72.0] 09/02/2015  . Tricompartment osteoarthritis of both knees [M17.0] 06/15/2015  . Chronic abdominal pain [R10.9, G89.29] 03/14/2015  . Vitamin D deficiency [E55.9] 03/12/2015  . Hyperlipidemia [E78.5] 02/17/2015  . Fall [W19.XXXA] 12/24/2014  . History of alcohol abuse [Z87.898] 12/05/2014  . Protein-calorie malnutrition, severe [E43] 11/18/2014  . Alcohol use disorder ([place]) [F10.99]   . Cervical arthritis ([place]) [M46.92] 01/01/2014  . Health care maintenance [Z00.00] 08/25/2011  . Chronic alcoholic pancreatitis  ([place]) [K86.0] 04/20/2011  . Type 2 diabetes mellitus with peripheral neuropathy ([place]) [E11.42] 12/09/2010  . HTN (hypertension) [I10] 12/09/2010   Total Time spent with patient: 30 minutes  Past Psychiatric History: see H&P  Past Medical History:  Past Medical History:  Diagnosis Date  . Diabetes mellitus without complication ([place])   . Pancreatitis   . Seizures ([place])     Past Surgical History:  Procedure Laterality Date  . BALLOON DILATION  03/22/2011   Procedure: BALLOON DILATION;  Surgeon: Gatha Mayer, MD;  Location: [facility];  Service: Endoscopy;  Laterality: N/A;  . COLONOSCOPY N/A 05/22/2012   Procedure: COLONOSCOPY;  Surgeon: Gatha Mayer, MD;  Location: [place];  Service: Endoscopy;  Laterality: N/A;  . COLONOSCOPY W/ BIOPSIES AND POLYPECTOMY  09/12/11  . ESOPHAGOGASTRODUODENOSCOPY  03/22/2011   Procedure: ESOPHAGOGASTRODUODENOSCOPY (EGD);  Surgeon: Gatha Mayer, MD;  Location: [facility];  Service: Endoscopy;  Laterality: N/A;  egd with balloon   . EUS  04/20/2011   Procedure: UPPER ENDOSCOPIC ULTRASOUND (EUS) LINEAR;  Surgeon: Milus Banister, MD;  Location: [facility];  Service: Endoscopy;  Laterality: N/A;   Family History:  Family History  Problem Relation Age of Onset  . Hypertension Sister   . Diabetes Sister   . Heart disease Sister   . Colon cancer Neg Hx   . Stomach cancer Neg Hx   . Anesthesia problems Neg Hx   . Hypotension Neg Hx   . Pseudochol deficiency Neg Hx   . Malignant hyperthermia Neg Hx    Family Psychiatric  History: see H&P Social History:  History  Alcohol Use  . 6.6 oz/week  . 10 Standard drinks or equivalent, 1 Cans of beer per week  Comment: drinks occassionally     History  Drug Use No    Comment: 11/17/2014 "maybe once/month"    Social History   Social History  . Marital status: Single    Spouse name: N/A  . Number of children: 0  . Years of education: 60   Occupational History  .    [employer]     worked for 6 years  . 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 Smoking status: Current Every Day Smoker    Packs/day: 0.40    Years: 40.00    Types: Cigarettes  . Smokeless tobacco: Never Used     Comment: 5 cigs per day  . Alcohol use 6.6 oz/week    10 Standard drinks or equivalent, 1 Cans of beer per week     Comment: drinks occassionally  . Drug use: No     Comment: 11/17/2014 "maybe once/month"  . Sexual activity: Not Asked   Other Topics Concern  . None   Social History Narrative   Has been living at 3 different friend's homes since discharge 01/2011. Was living in the shelter prior to admission. Had previously lived with his niece and other family members but he has gotten kicked out each time.    Additional Social History:                         Sleep: Good  Appetite:  Good  Current Medications: Current Facility-Administered Medications  Medication Dose Route Frequency Provider Last Rate Last Dose  . amitriptyline (ELAVIL) tablet 50 mg  50 mg Oral QHS Kerrie Buffalo, NP   50 mg at 06/04/16 0054  . antiseptic oral rinse (BIOTENE) solution 15 mL  15 mL Mouth Rinse PRN Withrow, Elyse Jarvis, FNP      . ARIPiprazole (ABILIFY) tablet 5 mg  5 mg Oral Daily Izediuno, Laruth Bouchard, MD   5 mg at 06/04/16 0940  . diclofenac sodium (VOLTAREN) 1 % transdermal gel 4 g  4 g Topical QID Kerrie Buffalo, NP   4 g at 06/04/16 0934  . folic acid (FOLVITE) tablet 1 mg  1 mg Oral Daily Kerrie Buffalo, NP   1 mg at 06/03/16 1117  . gabapentin (NEURONTIN) capsule 300 mg  300 mg Oral TID Derrill Center, NP   300 mg at 06/04/16 0936  . ibuprofen (ADVIL,MOTRIN) tablet 600 mg  600 mg Oral Q8H PRN Kerrie Buffalo, NP   600 mg at 06/03/16 1142  . insulin aspart (novoLOG) injection 0-5 Units  0-5 Units Subcutaneous QHS Laverle Hobby, PA-C   5 Units at 06/03/16 2141  . insulin aspart (novoLOG) injection 0-9 Units  0-9 Units  Subcutaneous TID WC Patriciaann Clan E, PA-C   2 Units at 06/04/16 0272  . insulin glargine (LANTUS) injection 10 Units  10 Units Subcutaneous Q2200 Derrill Center, NP   10 Units at 06/03/16 2142  . lipase/protease/amylase (CREON) capsule 36,000 Units  36,000 Units Oral TID Burley Saver, NP   36,000 Units at 06/04/16 [phone]  . LORazepam (ATIVAN) tablet 1 mg  1 mg Oral Q6H PRN Laverle Hobby, PA-C   1 mg at 06/03/16 2143  . magnesium hydroxide (MILK OF MAGNESIA) suspension 30 mL  30 mL Oral Daily PRN Kerrie Buffalo, NP      . multivitamin with minerals tablet 1 tablet  1 tablet  Oral Daily Ethelene Hal, NP   1 tablet at 06/04/16 0940  . nicotine (NICODERM CQ - dosed in mg/24 hours) patch 21 mg  21 mg Transdermal Daily Kerrie Buffalo, NP   21 mg at 06/04/16 0936  . pantoprazole (PROTONIX) EC tablet 40 mg  40 mg Oral Daily Kerrie Buffalo, NP   40 mg at 06/04/16 0940  . pravastatin (PRAVACHOL) tablet 40 mg  40 mg Oral q1800 Kerrie Buffalo, NP   40 mg at 06/03/16 1727  . thiamine (VITAMIN B-1) tablet 100 mg  100 mg Oral Daily Kerrie Buffalo, NP   100 mg at 06/04/16 0940   Or  . thiamine (B-1) injection 100 mg  100 mg Intravenous Daily Kerrie Buffalo, NP      . traZODone (DESYREL) tablet 50 mg  50 mg Oral QHS PRN Kerrie Buffalo, NP   50 mg at 06/02/16 2144    Lab Results:  Results for orders placed or performed during the hospital encounter of 05/31/16 (from the past 48 hour(s))  Glucose, capillary     Status: Abnormal   Collection Time: 06/02/16 12:12 PM  Result Value Ref Range   Glucose-Capillary 336 (H) 65 - 99 mg/dL  Glucose, capillary     Status: Abnormal   Collection Time: 06/02/16  5:24 PM  Result Value Ref Range   Glucose-Capillary 322 (H) 65 - 99 mg/dL  Glucose, capillary     Status: Abnormal   Collection Time: 06/02/16  8:47 PM  Result Value Ref Range   Glucose-Capillary 217 (H) 65 - 99 mg/dL  Glucose, capillary     Status: Abnormal   Collection Time: 06/03/16   6:12 AM  Result Value Ref Range   Glucose-Capillary 245 (H) 65 - 99 mg/dL  Glucose, capillary     Status: Abnormal   Collection Time: 06/03/16 11:26 AM  Result Value Ref Range   Glucose-Capillary 248 (H) 65 - 99 mg/dL  Glucose, capillary     Status: Abnormal   Collection Time: 06/03/16  5:13 PM  Result Value Ref Range   Glucose-Capillary 421 (H) 65 - 99 mg/dL  Glucose, capillary     Status: Abnormal   Collection Time: 06/03/16  5:33 PM  Result Value Ref Range   Glucose-Capillary 552 (HH) 65 - 99 mg/dL  Glucose, capillary     Status: Abnormal   Collection Time: 06/03/16  6:39 PM  Result Value Ref Range   Glucose-Capillary 489 (H) 65 - 99 mg/dL   Comment 1 Notify RN    Comment 2 Document in Chart   Glucose, capillary     Status: Abnormal   Collection Time: 06/03/16  8:49 PM  Result Value Ref Range   Glucose-Capillary 359 (H) 65 - 99 mg/dL   Comment 1 Notify RN   Glucose, capillary     Status: Abnormal   Collection Time: 06/04/16  5:49 AM  Result Value Ref Range   Glucose-Capillary 173 (H) 65 - 99 mg/dL    Blood Alcohol level:  Lab Results  Component Value Date   ETH 248 (H) 05/31/2016   ETH 255 (H) 62/69/4854    Metabolic Disorder Labs: Lab Results  Component Value Date   HGBA1C 11.5 01/14/2016   MPG 349 12/04/2014   MPG 292 11/17/2014   No results found for: PROLACTIN Lab Results  Component Value Date   CHOL 157 01/19/2014   TRIG 154 (H) 01/19/2014   HDL 48 01/19/2014   CHOLHDL 3.3 01/19/2014   VLDL 31 01/19/2014  [place] 78 01/19/2014   [place]  06/14/2012     Comment:       Not calculated due to Triglyceride >400. Suggest ordering Direct LDL (Unit Code: [phone]).   Total Cholesterol/HDL Ratio:CHD Risk                        Coronary Heart Disease Risk Table                                        Men       Women          1/2 Average Risk              3.4        3.3              Average Risk              5.0        4.4           2X Average Risk               9.6        7.1           3X Average Risk             23.4       11.0 Use the calculated Patient Ratio above and the CHD Risk table  to determine the patient's CHD Risk. ATP III Classification (LDL):       < 100        mg/dL         Optimal      100 - 129     mg/dL         Near or Above Optimal      130 - 159     mg/dL         Borderline High      160 - 189     mg/dL         High       > 190        mg/dL         Very High      Physical Findings: AIMS: Facial and Oral Movements Muscles of Facial Expression: None, normal Lips and Perioral Area: None, normal Jaw: None, normal Tongue: None, normal,Extremity Movements Upper (arms, wrists, hands, fingers): None, normal Lower (legs, knees, ankles, toes): None, normal, Trunk Movements Neck, shoulders, hips: None, normal, Overall Severity Severity of abnormal movements (highest score from questions above): None, normal Incapacitation due to abnormal movements: None, normal Patient's awareness of abnormal movements (rate only patient's report): No Awareness, Dental Status Current problems with teeth and/or dentures?: Yes Does patient usually wear dentures?: No  CIWA:  CIWA-Ar Total: 2 COWS:     Musculoskeletal: Strength & Muscle Tone: within normal limits Gait & Station: normal Patient leans: N/A  Psychiatric Specialty Exam: Physical Exam  Vitals reviewed. Constitutional: He is oriented to person, place, and time. He appears well-developed.  HENT:  Head: Normocephalic.  Cardiovascular: Normal rate.   Neurological: He is alert and oriented to person, place, and time.  Psychiatric: He has a normal mood and affect. His behavior is normal.    Review of Systems  Psychiatric/Behavioral: Positive for depression and substance abuse. Negative for hallucinations and suicidal ideas. The patient is nervous/anxious  and has insomnia.   All other systems reviewed and are negative.   Blood pressure 116/84, pulse (!) 105, temperature 97.8 F  (36.6 C), temperature source Oral, resp. rate 16, height 5' 3.25" (1.607 m), weight 65.3 kg (144 lb).Body mass index is 25.31 kg/m.  General Appearance: Casual and Fairly Groomed  Eye Contact:  Good  Speech:  Clear and Coherent and Normal Rate  Volume:  Normal  Mood:  Euthymic  Affect:  Appropriate and Congruent  Thought Process:  Coherent, Goal Directed, Linear and Descriptions of Associations: Intact  Orientation:  Full (Time, Place, and Person)  Thought Content:  Focused on med management  Suicidal Thoughts:  No  Homicidal Thoughts:  No  Memory:  Immediate;   Fair Recent;   Fair Remote;   Fair  Judgement:  Fair  Insight:  Fair  Psychomotor Activity:  Normal  Concentration:  Concentration: Fair and Attention Span: Fair  Recall:  [employer] of Knowledge:  Fair  Language:  Fair  Akathisia:  No  Handed:    AIMS (if indicated):     Assets:  Communication Skills Desire for Improvement Resilience  ADL's:  Intact  Cognition:  WNL  Sleep:  Number of Hours: 0.5    I agree with current treatment plan on 06/04/2016, Patient seen face-to-face for psychiatric evaluation follow-up, chart reviewed. Reviewed the information documented and agree with the treatment plan.  Treatment Plan Summary: Bipolar disorder ([place]) unstable   Continue with current treatment plan listed below on 06/04/2016 except where noted   Medications:  -Continue Trazodone 50mg  po qhs prn insomnia -Continue nicotine patch -Continue Vistaril 25mg  po q6h prn anxiety -Continue Abilify 5mg  po daily for mood stabilization/bipolar -Continue Elavil 50mg  po qhs for RLS Contine Neurontin 300mg  po bid to TID for anxiety -Continue non-psych meds including Lantus 6u qhs for DM2, Creon for hx panc insufficiency, Protonix 40mg  for GERD, Pravachol 40mg  for HLD, Voltaren 1% qid for joint pain  - Initiated endocrine consults for uncontrolled CBG's. Increased Lantus to 10 units QHS  - changed sliding scale to Moderate intensity  scale   Derrill Center, NP 06/04/2016, 9:54 AM

## 2016-06-05 ENCOUNTER — Inpatient Hospital Stay (HOSPITAL_COMMUNITY)
Admission: AD | Admit: 2016-06-05 | Discharge: 2016-06-08 | DRG: 896 | Disposition: A | Discharge status: Other Acute Inpatient Hospital | Payer: Medicare HMO | Source: Ambulatory Visit | Attending: Internal Medicine | Admitting: Internal Medicine

## 2016-06-05 ENCOUNTER — Inpatient Hospital Stay (HOSPITAL_COMMUNITY): Payer: Medicare HMO

## 2016-06-05 DIAGNOSIS — E1042 Type 1 diabetes mellitus with diabetic polyneuropathy: Secondary | ICD-10-CM | POA: Diagnosis present

## 2016-06-05 DIAGNOSIS — E1065 Type 1 diabetes mellitus with hyperglycemia: Secondary | ICD-10-CM | POA: Diagnosis present

## 2016-06-05 DIAGNOSIS — K219 Gastro-esophageal reflux disease without esophagitis: Secondary | ICD-10-CM | POA: Diagnosis present

## 2016-06-05 DIAGNOSIS — E1142 Type 2 diabetes mellitus with diabetic polyneuropathy: Secondary | ICD-10-CM

## 2016-06-05 DIAGNOSIS — F419 Anxiety disorder, unspecified: Secondary | ICD-10-CM | POA: Diagnosis present

## 2016-06-05 DIAGNOSIS — R569 Unspecified convulsions: Secondary | ICD-10-CM | POA: Diagnosis present

## 2016-06-05 DIAGNOSIS — Z888 Allergy status to other drugs, medicaments and biological substances status: Secondary | ICD-10-CM

## 2016-06-05 DIAGNOSIS — R45850 Homicidal ideations: Secondary | ICD-10-CM | POA: Diagnosis not present

## 2016-06-05 DIAGNOSIS — E876 Hypokalemia: Secondary | ICD-10-CM | POA: Diagnosis not present

## 2016-06-05 DIAGNOSIS — R45851 Suicidal ideations: Secondary | ICD-10-CM | POA: Diagnosis present

## 2016-06-05 DIAGNOSIS — G4700 Insomnia, unspecified: Secondary | ICD-10-CM

## 2016-06-05 DIAGNOSIS — K861 Other chronic pancreatitis: Secondary | ICD-10-CM

## 2016-06-05 DIAGNOSIS — F209 Schizophrenia, unspecified: Secondary | ICD-10-CM | POA: Diagnosis present

## 2016-06-05 DIAGNOSIS — Z833 Family history of diabetes mellitus: Secondary | ICD-10-CM

## 2016-06-05 DIAGNOSIS — Z794 Long term (current) use of insulin: Secondary | ICD-10-CM | POA: Diagnosis not present

## 2016-06-05 DIAGNOSIS — Z79899 Other long term (current) drug therapy: Secondary | ICD-10-CM

## 2016-06-05 DIAGNOSIS — E119 Type 2 diabetes mellitus without complications: Secondary | ICD-10-CM | POA: Diagnosis present

## 2016-06-05 DIAGNOSIS — E10649 Type 1 diabetes mellitus with hypoglycemia without coma: Secondary | ICD-10-CM | POA: Diagnosis not present

## 2016-06-05 DIAGNOSIS — G9340 Encephalopathy, unspecified: Secondary | ICD-10-CM

## 2016-06-05 DIAGNOSIS — E108 Type 1 diabetes mellitus with unspecified complications: Secondary | ICD-10-CM | POA: Diagnosis not present

## 2016-06-05 DIAGNOSIS — R739 Hyperglycemia, unspecified: Secondary | ICD-10-CM | POA: Diagnosis not present

## 2016-06-05 DIAGNOSIS — G40909 Epilepsy, unspecified, not intractable, without status epilepticus: Secondary | ICD-10-CM

## 2016-06-05 DIAGNOSIS — Y908 Blood alcohol level of 240 mg/100 ml or more: Secondary | ICD-10-CM | POA: Diagnosis present

## 2016-06-05 DIAGNOSIS — Z886 Allergy status to analgesic agent status: Secondary | ICD-10-CM | POA: Diagnosis not present

## 2016-06-05 DIAGNOSIS — F10231 Alcohol dependence with withdrawal delirium: Secondary | ICD-10-CM

## 2016-06-05 DIAGNOSIS — F10239 Alcohol dependence with withdrawal, unspecified: Secondary | ICD-10-CM | POA: Diagnosis present

## 2016-06-05 DIAGNOSIS — F319 Bipolar disorder, unspecified: Secondary | ICD-10-CM | POA: Diagnosis present

## 2016-06-05 DIAGNOSIS — F10939 Alcohol use, unspecified with withdrawal, unspecified: Secondary | ICD-10-CM | POA: Diagnosis present

## 2016-06-05 DIAGNOSIS — F329 Major depressive disorder, single episode, unspecified: Secondary | ICD-10-CM | POA: Diagnosis not present

## 2016-06-05 DIAGNOSIS — E161 Other hypoglycemia: Secondary | ICD-10-CM | POA: Diagnosis not present

## 2016-06-05 DIAGNOSIS — Z720 Tobacco use: Secondary | ICD-10-CM

## 2016-06-05 DIAGNOSIS — R4182 Altered mental status, unspecified: Secondary | ICD-10-CM | POA: Diagnosis not present

## 2016-06-05 DIAGNOSIS — E1069 Type 1 diabetes mellitus with other specified complication: Secondary | ICD-10-CM | POA: Diagnosis not present

## 2016-06-05 DIAGNOSIS — Z8249 Family history of ischemic heart disease and other diseases of the circulatory system: Secondary | ICD-10-CM

## 2016-06-05 DIAGNOSIS — R410 Disorientation, unspecified: Secondary | ICD-10-CM | POA: Diagnosis present

## 2016-06-05 DIAGNOSIS — K860 Alcohol-induced chronic pancreatitis: Secondary | ICD-10-CM | POA: Diagnosis not present

## 2016-06-05 DIAGNOSIS — G2581 Restless legs syndrome: Secondary | ICD-10-CM | POA: Diagnosis present

## 2016-06-05 HISTORY — DX: Anxiety disorder, unspecified: F41.9

## 2016-06-05 HISTORY — DX: Headache: R51

## 2016-06-05 HISTORY — DX: Schizophrenia, unspecified: F20.9

## 2016-06-05 HISTORY — DX: Unspecified osteoarthritis, unspecified site: M19.90

## 2016-06-05 HISTORY — DX: Headache, unspecified: R51.9

## 2016-06-05 HISTORY — DX: Type 2 diabetes mellitus with diabetic polyneuropathy: E11.42

## 2016-06-05 LAB — CBC WITH DIFFERENTIAL/PLATELET
Basophils Absolute: 0 10*3/uL (ref 0.0–0.1)
Basophils Relative: 0 %
EOS ABS: 0.2 10*3/uL (ref 0.0–0.7)
Eosinophils Relative: 2 %
HEMATOCRIT: 39.6 % (ref 39.0–52.0)
HEMOGLOBIN: 13.7 g/dL (ref 13.0–17.0)
LYMPHS ABS: 2.5 10*3/uL (ref 0.7–4.0)
Lymphocytes Relative: 34 %
MCH: 31.9 pg (ref 26.0–34.0)
MCHC: 34.6 g/dL (ref 30.0–36.0)
MCV: 92.1 fL (ref 78.0–100.0)
Monocytes Absolute: 0.6 10*3/uL (ref 0.1–1.0)
Monocytes Relative: 8 %
NEUTROS PCT: 56 %
Neutro Abs: 4.1 10*3/uL (ref 1.7–7.7)
Platelets: 109 10*3/uL — ABNORMAL LOW (ref 150–400)
RBC: 4.3 MIL/uL (ref 4.22–5.81)
RDW: 14.6 % (ref 11.5–15.5)
WBC: 7.4 10*3/uL (ref 4.0–10.5)

## 2016-06-05 LAB — CBC
HEMATOCRIT: 38.6 % — AB (ref 39.0–52.0)
Hemoglobin: 13.4 g/dL (ref 13.0–17.0)
MCH: 32.4 pg (ref 26.0–34.0)
MCHC: 34.7 g/dL (ref 30.0–36.0)
MCV: 93.2 fL (ref 78.0–100.0)
Platelets: 98 10*3/uL — ABNORMAL LOW (ref 150–400)
RBC: 4.14 MIL/uL — ABNORMAL LOW (ref 4.22–5.81)
RDW: 14.4 % (ref 11.5–15.5)
WBC: 7.2 10*3/uL (ref 4.0–10.5)

## 2016-06-05 LAB — GLUCOSE, CAPILLARY
GLUCOSE-CAPILLARY: 52 mg/dL — AB (ref 65–99)
GLUCOSE-CAPILLARY: 76 mg/dL (ref 65–99)
GLUCOSE-CAPILLARY: 139 mg/dL — AB (ref 65–99)
Glucose-Capillary: 118 mg/dL — ABNORMAL HIGH (ref 65–99)
Glucose-Capillary: 126 mg/dL — ABNORMAL HIGH (ref 65–99)
Glucose-Capillary: 138 mg/dL — ABNORMAL HIGH (ref 65–99)
Glucose-Capillary: 145 mg/dL — ABNORMAL HIGH (ref 65–99)
Glucose-Capillary: 176 mg/dL — ABNORMAL HIGH (ref 65–99)
Glucose-Capillary: 235 mg/dL — ABNORMAL HIGH (ref 65–99)
Glucose-Capillary: 55 mg/dL — ABNORMAL LOW (ref 65–99)

## 2016-06-05 LAB — BASIC METABOLIC PANEL
ANION GAP: 7 (ref 5–15)
BUN: 7 mg/dL (ref 6–20)
CO2: 29 mmol/L (ref 22–32)
Calcium: 8.6 mg/dL — ABNORMAL LOW (ref 8.9–10.3)
Chloride: 105 mmol/L (ref 101–111)
Creatinine, Ser: 0.8 mg/dL (ref 0.61–1.24)
GFR calc Af Amer: >60 mL/min (ref >60)
GFR calc non Af Amer: >60 mL/min (ref >60)
GLUCOSE: 202 mg/dL — AB (ref 65–99)
POTASSIUM: 3.9 mmol/L (ref 3.5–5.1)
Sodium: 141 mmol/L (ref 135–145)

## 2016-06-05 LAB — COMPREHENSIVE METABOLIC PANEL
ALBUMIN: 3.2 g/dL — AB (ref 3.5–5.0)
ALT: 43 U/L (ref 17–63)
AST: 32 U/L (ref 15–41)
Alkaline Phosphatase: 141 U/L — ABNORMAL HIGH (ref 38–126)
Anion gap: 7 (ref 5–15)
BUN: 7 mg/dL (ref 6–20)
CHLORIDE: 106 mmol/L (ref 101–111)
CO2: 26 mmol/L (ref 22–32)
CREATININE: 0.95 mg/dL (ref 0.61–1.24)
Calcium: 8.4 mg/dL — ABNORMAL LOW (ref 8.9–10.3)
GFR calc non Af Amer: >60 mL/min (ref >60)
Glucose, Bld: 234 mg/dL — ABNORMAL HIGH (ref 65–99)
Potassium: 3.7 mmol/L (ref 3.5–5.1)
Sodium: 139 mmol/L (ref 135–145)
Total Bilirubin: 0.4 mg/dL (ref 0.3–1.2)
Total Protein: 5.4 g/dL — ABNORMAL LOW (ref 6.5–8.1)

## 2016-06-05 LAB — RAPID URINE DRUG SCREEN, HOSP PERFORMED
Amphetamines: NOT DETECTED
Barbiturates: NOT DETECTED
Benzodiazepines: POSITIVE — AB
COCAINE: NOT DETECTED
OPIATES: NOT DETECTED
Tetrahydrocannabinol: NOT DETECTED

## 2016-06-05 LAB — URINALYSIS, ROUTINE W REFLEX MICROSCOPIC
BACTERIA UA: NONE SEEN
Bilirubin Urine: NEGATIVE
Glucose, UA: >=500 mg/dL — AB
Hgb urine dipstick: NEGATIVE
KETONES UR: NEGATIVE mg/dL
Nitrite: NEGATIVE
PROTEIN: NEGATIVE mg/dL
Specific Gravity, Urine: 1.023 (ref 1.005–1.030)
pH: 6 (ref 5.0–8.0)

## 2016-06-05 LAB — HIV ANTIBODY (ROUTINE TESTING W REFLEX): HIV Screen 4th Generation wRfx: NONREACTIVE

## 2016-06-05 LAB — I-STAT TROPONIN, ED: TROPONIN I, POC: 0 ng/mL (ref 0.00–0.08)

## 2016-06-05 LAB — MRSA PCR SCREENING: MRSA by PCR: NEGATIVE

## 2016-06-05 LAB — PROTIME-INR
INR: 0.93
Prothrombin Time: 12.4 seconds (ref 11.4–15.2)

## 2016-06-05 LAB — AMMONIA: Ammonia: 69 umol/L — ABNORMAL HIGH (ref 9–35)

## 2016-06-05 MED ORDER — INSULIN ASPART 100 UNIT/ML ~~LOC~~ SOLN
0.0000 [IU] | SUBCUTANEOUS | Status: DC
  Administered 2016-06-05: 8 [IU] via SUBCUTANEOUS

## 2016-06-05 MED ORDER — LORAZEPAM 2 MG/ML IJ SOLN
2.0000 mg | Freq: Once | INTRAMUSCULAR | Status: AC
  Administered 2016-06-05: 2 mg via INTRAVENOUS
  Filled 2016-06-05: qty 1

## 2016-06-05 MED ORDER — SODIUM CHLORIDE 0.9% FLUSH
3.0000 mL | Freq: Two times a day (BID) | INTRAVENOUS | Status: DC
  Administered 2016-06-05 – 2016-06-08 (×6): 3 mL via INTRAVENOUS

## 2016-06-05 MED ORDER — LOPERAMIDE HCL 2 MG PO CAPS: 2.0000 mg | ORAL_CAPSULE | ORAL | Status: DC | PRN

## 2016-06-05 MED ORDER — INSULIN GLARGINE 100 UNIT/ML ~~LOC~~ SOLN: 15.0000 [IU] | Freq: Every day | SUBCUTANEOUS | Status: DC

## 2016-06-05 MED ORDER — ONDANSETRON 4 MG PO TBDP: 4.0000 mg | ORAL_TABLET | Freq: Four times a day (QID) | ORAL | Status: DC | PRN

## 2016-06-05 MED ORDER — SODIUM CHLORIDE 0.9 % IV BOLUS (SEPSIS)
1000.0000 mL | Freq: Once | INTRAVENOUS | Status: AC
  Administered 2016-06-05: 1000 mL via INTRAVENOUS

## 2016-06-05 MED ORDER — FOLIC ACID 1 MG PO TABS
1.0000 mg | ORAL_TABLET | Freq: Every day | ORAL | Status: DC
  Administered 2016-06-05 – 2016-06-08 (×4): 1 mg via ORAL
  Filled 2016-06-05 (×4): qty 1

## 2016-06-05 MED ORDER — PANTOPRAZOLE SODIUM 40 MG PO TBEC
40.0000 mg | DELAYED_RELEASE_TABLET | Freq: Every day | ORAL | Status: DC
  Administered 2016-06-06 – 2016-06-08 (×3): 40 mg via ORAL
  Filled 2016-06-05 (×3): qty 1

## 2016-06-05 MED ORDER — SODIUM CHLORIDE 0.9 % IV SOLN
INTRAVENOUS | Status: DC
  Administered 2016-06-05: 10:00:00 via INTRAVENOUS

## 2016-06-05 MED ORDER — LORAZEPAM 2 MG/ML IJ SOLN
4.0000 mg | Freq: Once | INTRAMUSCULAR | Status: AC
  Administered 2016-06-05: 4 mg via INTRAMUSCULAR

## 2016-06-05 MED ORDER — AMITRIPTYLINE HCL 50 MG PO TABS
50.0000 mg | ORAL_TABLET | Freq: Every day | ORAL | Status: DC
  Administered 2016-06-05 – 2016-06-07 (×3): 50 mg via ORAL
  Filled 2016-06-05 (×3): qty 1

## 2016-06-05 MED ORDER — ENOXAPARIN SODIUM 40 MG/0.4ML ~~LOC~~ SOLN
40.0000 mg | SUBCUTANEOUS | Status: DC
  Administered 2016-06-05 – 2016-06-08 (×4): 40 mg via SUBCUTANEOUS
  Filled 2016-06-05 (×5): qty 0.4

## 2016-06-05 MED ORDER — GABAPENTIN 300 MG PO CAPS
300.0000 mg | ORAL_CAPSULE | Freq: Three times a day (TID) | ORAL | Status: DC
  Administered 2016-06-05 – 2016-06-08 (×10): 300 mg via ORAL
  Filled 2016-06-05 (×11): qty 1

## 2016-06-05 MED ORDER — VITAMIN B-1 100 MG PO TABS
100.0000 mg | ORAL_TABLET | Freq: Every day | ORAL | Status: DC
  Administered 2016-06-05 – 2016-06-08 (×4): 100 mg via ORAL
  Filled 2016-06-05 (×4): qty 1

## 2016-06-05 MED ORDER — ADULT MULTIVITAMIN W/MINERALS CH: 1.0000 | ORAL_TABLET | Freq: Every day | ORAL | Status: DC

## 2016-06-05 MED ORDER — LORAZEPAM 2 MG/ML IJ SOLN: 0.0000 mg | Freq: Two times a day (BID) | INTRAMUSCULAR | Status: DC

## 2016-06-05 MED ORDER — LORAZEPAM 2 MG/ML IJ SOLN
2.0000 mg | INTRAMUSCULAR | Status: DC | PRN
  Filled 2016-06-05 (×3): qty 2

## 2016-06-05 MED ORDER — CHLORDIAZEPOXIDE HCL 25 MG PO CAPS: 25.0000 mg | ORAL_CAPSULE | Freq: Two times a day (BID) | ORAL | Status: DC

## 2016-06-05 MED ORDER — LORAZEPAM 2 MG/ML IJ SOLN
INTRAMUSCULAR | Status: AC
  Filled 2016-06-05: qty 2

## 2016-06-05 MED ORDER — CHLORDIAZEPOXIDE HCL 5 MG PO CAPS: 25.0000 mg | ORAL_CAPSULE | Freq: Four times a day (QID) | ORAL | Status: DC | PRN

## 2016-06-05 MED ORDER — THIAMINE HCL 100 MG/ML IJ SOLN
100.0000 mg | Freq: Every day | INTRAMUSCULAR | Status: DC
  Administered 2016-06-05: 100 mg via INTRAVENOUS

## 2016-06-05 MED ORDER — VITAMIN B-1 100 MG PO TABS: 100.0000 mg | ORAL_TABLET | Freq: Every day | ORAL | Status: DC

## 2016-06-05 MED ORDER — INSULIN ASPART 100 UNIT/ML ~~LOC~~ SOLN
0.0000 [IU] | SUBCUTANEOUS | Status: DC
  Administered 2016-06-06: 3 [IU] via SUBCUTANEOUS
  Administered 2016-06-06: 2 [IU] via SUBCUTANEOUS

## 2016-06-05 MED ORDER — DEXTROSE 50 % IV SOLN
INTRAVENOUS | Status: AC
Start: 2016-06-05 — End: 2016-06-05
  Administered 2016-06-05: 25 mL via INTRAVENOUS
  Filled 2016-06-05: qty 50

## 2016-06-05 MED ORDER — DEXTROSE 50 % IV SOLN
25.0000 mL | Freq: Once | INTRAVENOUS | Status: AC
  Administered 2016-06-05: 25 mL via INTRAVENOUS
  Filled 2016-06-05: qty 50

## 2016-06-05 MED ORDER — LORAZEPAM 2 MG/ML IJ SOLN
0.0000 mg | Freq: Four times a day (QID) | INTRAMUSCULAR | Status: DC
  Administered 2016-06-05: 4 mg via INTRAVENOUS
  Filled 2016-06-05: qty 2

## 2016-06-05 MED ORDER — HYDROXYZINE HCL 25 MG PO TABS: 25.0000 mg | ORAL_TABLET | Freq: Four times a day (QID) | ORAL | Status: DC | PRN

## 2016-06-05 MED ORDER — DEXTROSE 50 % IV SOLN
25.0000 mL | Freq: Once | INTRAVENOUS | Status: AC
  Administered 2016-06-05: 25 mL via INTRAVENOUS

## 2016-06-05 MED ORDER — LORAZEPAM 2 MG/ML IJ SOLN
2.0000 mg | INTRAMUSCULAR | Status: DC | PRN
  Administered 2016-06-05 – 2016-06-06 (×7): 2 mg via INTRAVENOUS
  Administered 2016-06-06: 3 mg via INTRAVENOUS
  Filled 2016-06-05: qty 2
  Filled 2016-06-05 (×5): qty 1

## 2016-06-05 MED ORDER — ENOXAPARIN SODIUM 40 MG/0.4ML ~~LOC~~ SOLN: 40.0000 mg | SUBCUTANEOUS | Status: DC

## 2016-06-05 MED ORDER — DULOXETINE HCL 60 MG PO CPEP
60.0000 mg | ORAL_CAPSULE | Freq: Every day | ORAL | Status: DC
  Administered 2016-06-05 – 2016-06-08 (×4): 60 mg via ORAL
  Filled 2016-06-05 (×4): qty 1

## 2016-06-05 MED ORDER — CHLORDIAZEPOXIDE HCL 5 MG PO CAPS: 50.0000 mg | ORAL_CAPSULE | Freq: Once | ORAL | Status: DC

## 2016-06-05 MED ORDER — CHLORDIAZEPOXIDE HCL 25 MG PO CAPS
50.0000 mg | ORAL_CAPSULE | Freq: Three times a day (TID) | ORAL | Status: DC
  Administered 2016-06-05 (×2): 50 mg via ORAL
  Filled 2016-06-05: qty 10
  Filled 2016-06-05: qty 2

## 2016-06-05 MED ORDER — THIAMINE HCL 100 MG/ML IJ SOLN
100.0000 mg | Freq: Once | INTRAMUSCULAR | Status: DC
  Filled 2016-06-05: qty 2

## 2016-06-05 NOTE — ED Notes (Addendum)
Dr Marcello Moores and Dr Benjamine Mola at bedside speaking with pt.

## 2016-06-05 NOTE — ED Notes (Signed)
Cleaned up PT and changed sheets gave him  two warm blankets

## 2016-06-05 NOTE — ED Notes (Signed)
Pt intermittently yelling for [facility].  This RN continues to attempt to reorient pt.  Pt does not believe he is at the hospital nor that the people he sees are hospital staff.

## 2016-06-05 NOTE — ED Notes (Addendum)
Pt is resting and continues to hallucinate Pt is talking to someone about having a cigarette.

## 2016-06-05 NOTE — ED Notes (Signed)
Bed placement working on a sitter.

## 2016-06-05 NOTE — ED Notes (Signed)
Patient noted to be very anxious and restless.  Wandering around the room cursing staff.  Having auditory and visual hallucinations.  Very paranoid.  CIWA done and documented.  Ativan 4mg  IV given per ciwa protocol.  Sitter at the bedside.  Noted to be tachycardic otherwise VSS.  Encouraged sitter to call for assistance as needed.

## 2016-06-05 NOTE — Progress Notes (Signed)
Paged MD Molt concerning pts agitation. RN as administered 2 mg Q1 hr to pt for agitation and hallucinations.  New orders given.

## 2016-06-05 NOTE — ED Notes (Signed)
Pt woke up and became belligerent and aggressive.  Pt threw his urinal with urine in it at staff.  Security came to bedside.  Pt continued to yell and curse at staff despite de-escalation attempts.  Pt placed in violent restraints by ED staff while security and [employer] restrained pt.  Pt given 2 mg IM ativan right thigh and 2 mg IM ativan left thigh.  Pt continues to be belligerent and cursing at all staff.  Pt will be hooked back up to monitoring system as he starts to calm down.  Pt has now urinated all over himself and room while restrained and continues to be verbally aggressive and fighting the restraints. Notified Dr Benjamine Mola.

## 2016-06-05 NOTE — Progress Notes (Signed)
Pt requested sister, [name], be contacted related to pt transfer to [facility].  Writer did this and left voicemail for pt's sister.

## 2016-06-05 NOTE — Progress Notes (Signed)
   Subjective: [name] was seen and evaluated today at bedside in the ED. He remains agitated and disoriented however could be redirected. Occasional hallucinations per nursing staff. He has been disruptive to staff, throwing things at staff and requires tuff-cough restraints from security.    Objective:  Vital signs in last 24 hours: Vitals:   06/05/16 0947 06/05/16 1015 06/05/16 1030 06/05/16 1045  BP: 124/89 121/88 122/82 133/87  Pulse: (!) 109 (!) 108 (!) 107 (!) 109  Resp: 16 (!) 21 (!) 21 18  Temp:      TempSrc:      SpO2: 100% 100% 100% 100%  Weight:      Height:       General: Alert but not oriented. Agitated and distressed. In 4-point restraints.  HENT: EOMI. PERRL. MMM Cardiovascular: Regular rate and rhythm. No murmur or rub appreciated.  Pulmonary: CTA BL. Unlabored breathing.  Abdomen: Soft, non-tender and non-distended. +bowel sounds.  Extremities: No peripheral edema noted BL LE.   Skin: Warm, dry. No cyanosis, suspicious rashes or lesions.  Psych: Mood agitated and confused and affect was mood congruent. Can be redirected.   Assessment/Plan:  Principal Problem:   Bipolar disorder ([place]) Active Problems:   MDD (major depressive disorder)   Alcohol withdrawal ([place])  Acute Encephalopathy likely secondary to Delirium Tremens, Major Depressive Disorder: Admitted 5/9 with alcohol intoxication with SI/HI however developed worsening delirium while at [facility]. He had been experiencing signs/syx of withdrawal however had been refusing ativan. Reviewed 9 units of Ativan so far since arrival and remains agitated and confused. He is having hallucinations per nursing staff. He is without tremor, diaphoresis or respiratory distress. Tachycardic however afebrile, without leukocytosis, lungs are clear and he is saturating well on room air. AMS most likely secondary to alcohol withdrawal and he is being closely monitored by staff on CIWA protocol.  -CT head without acute process. Doubt  infectious process. -Librium 25 mg BID scheduled -Ativan per CIWA protocol for symptom-delivered benzos -Continue CIWA with close monitoring -Seizure precautions (hx of seizures seem to be with HYPOGLYCEMIA not DT) -Suicide precautions  Brittle IDDM-2, complicated by chronic pancreatic insufficiency: CBGs 200's-400's at home usually. He is an extremely brittle type 2 diabetic which is also complicated by his chronic pancreatic insufficiency. Prone to hypoglycemia and associated seizures hence his tolerated hyperglycemia. Usually on Levemir 32 units QHS at home.  -CBG 250-350 -Increase lantus to 15 units QHS while hosp + SSI -NPO for now but Creon 36,000 units TID WC -Gabapentin 300mg  TID  Bipolar disorder, Insomnia, RLS: Continue Abilify 5mg  daily, trazodone 50 mg QHS PRn and amitriptyline 50 mg QHS. He will return to [facility] after stabilization.    Dispo: Anticipated discharge to [facility] once stable from alcohol withdrawal.    Day Greb, DO 06/05/2016, 10:44 AM Pager: [phone]

## 2016-06-05 NOTE — ED Notes (Signed)
Pt continues to rest, occasionally waking.  Pt sits up requests to be taken out of restraints so he can leave. Episodes of awakeness are decreasing in cursing and aggression. Pt remains disoriented x 4.

## 2016-06-05 NOTE — H&P (Signed)
Date: 06/05/2016               Patient Name:  Michael [name] MRN: [id number]  DOB: 1955/09/07 Age / Sex: 61 y.o., male   PCP: Collier Salina, MD         Medical Service: Internal Medicine Teaching Service         Attending Physician: Dr. Orpah Greek, *    First Contact: Dr. Danford Bad Pager: [phone]  Second Contact: Dr. Benjamine Mola Pager: [phone]       After Hours (After 5p/  First Contact Pager: [phone]  weekends / holidays): Second Contact Pager: [phone]   Chief Complaint: alcohol withdrawal   History of Present Illness:  61 year old man with PMH insulin dependent type 1 DM with peripheral neuropathy, pancreatitis, and seizures presented to the ED from home on 5/9. This history is taken from chart review as the patient is currently encephalopathic. On initial presentation he had EtOH intoxication and was expressing suicidal and homicidal ideations. EtOH level was 248, low K, and hyperglycemia and he was cleared for psych . Psych admitted him on 5/10-5/13, throughout their admission he had signs of withdrawal but was refusing ativan. Yesterday he became more agitated and disoriented and was transferred to the ED.  In the ED he was afebrile 97.8, HR 100s, BP 118/79, SpO2 100% on room air. He received 9 mg of ativan and 1 L NS bolus.   Meds:  Amitriptyline 50 mg qHS  voltaren gel  Duloxetine 60 mg qd  Folic acid daily  Gabapentin 900 mg qAM 1200 qHS  Hydroxyzine 10 mg TID  Insulin aspart 10 units qd  Insulin detemir 32 units qAM  Creon TID  loperimide PRN  Lovastatin 40 mg qHS  protonix 40 mg qd  Vit D 1 capsule weekly   Allergies: Allergies as of 05/31/2016 - Review Complete 05/31/2016  Allergen Reaction Noted  . Ace inhibitors Swelling 02/02/2011  . Losartan Swelling 02/03/2011  . Tylenol [acetaminophen] Other (See Comments) 04/09/2016   Past Medical History:  Diagnosis Date  . Diabetes mellitus without complication ([place])   . Pancreatitis   . Seizures  ([place])     Family History:  Family History  Problem Relation Age of Onset  . Hypertension Sister   . Diabetes Sister   . Heart disease Sister   . Colon cancer Neg Hx   . Stomach cancer Neg Hx   . Anesthesia problems Neg Hx   . Hypotension Neg Hx   . Pseudochol deficiency Neg Hx   . Malignant hyperthermia Neg Hx    Social History:  Social History   Social History  . Marital status: Single    Spouse name: N/A  . Number of children: 0  . Years of education: 34   Occupational History  .    [employer]    worked for 6 years  . 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 Smoking status: Current Every Day Smoker    Packs/day: 0.40    Years: 40.00    Types: Cigarettes  . Smokeless tobacco: Never Used     Comment: 5 cigs per day  . Alcohol use 6.6 oz/week    10 Standard drinks or equivalent, 1 Cans of beer per week     Comment: drinks occassionally  . Drug use: No     Comment: 11/17/2014 "maybe once/month"  .  Sexual activity: Not on file   Other Topics Concern  . Not on file   Social History Narrative   Has been living at 3 different friend's homes since discharge 01/2011. Was living in the shelter prior to admission. Had previously lived with his niece and other family members but he has gotten kicked out each time.    Review of Systems: A complete ROS was negative except as per HPI.  Physical Exam: Blood pressure (!) 122/96, pulse (!) 113, temperature 97.4 F (36.3 C), temperature source Oral, resp. rate 16, height 5' 4"  (1.626 m), weight 142 lb (64.4 kg), SpO2 100 %. Physical Exam  Constitutional: He appears well-developed and well-nourished. No distress.  HENT:  Head: Normocephalic and atraumatic.  Moist mucous membranes  Eyes: Conjunctivae are normal. Pupils are equal, round, and reactive to light. Right eye exhibits no discharge. Left eye exhibits no discharge. No scleral icterus.  Cardiovascular:  Normal rate and regular rhythm.   No murmur heard. Pulmonary/Chest: Effort normal. No respiratory distress.  Abdominal: Soft. Bowel sounds are normal. He exhibits no distension. There is no tenderness. There is no guarding.  Neurological:  Snoring and sleeping, arouses to painful stimulus but looks confused and combative   Skin: Skin is warm and dry. He is not diaphoretic.   Labs: Na 139, K 3.7, bicarb 26, BUN 7, crt 0.95, corrected calcium 8.6, glucose 234  Albumin 3.2, AST 32, ALT 43, tbili 0.4, alk phos 141, INR 0.9  WBC 7.4, Hgb 13.7, plt 109 Urinalysis glucose >500, small leukocytes Trop 0.0  UDS positive benzos   EKG: sinus tachycardia, ST changes in V3  CT head: Personal review of the CT head revealed cerebral atrophy   Assessment & Plan by Problem: Principal Problem:   Bipolar disorder (HCC) Active Problems:   MDD (major depressive disorder) Alcohol withdrawal  Patients last drink was 5/9 before he presented to the ED intoxicated. He has been hospitalized through behavioral health since then but was refusing ativan. Became acutely disoriented and agitated today and was transferred back to the ED for EtOH withdrawal management.  - ordered Librium 50 mg to be given when he wakes up  -CIWA monitoring  -thiamine, folic acid and MVM  -seizure precautions  - suicide precautions   Hypocalcemia  Calcium 8.6 corrected for low albumin.   Insulin dependent type 1 Diabetes Mellitis with peripheral neuropathy  Last A1c 12/2015 11.5. On novolog 10 units before dinner and levemir 32 units qAM -on CBG with meals and qHS  -on sensitive ISS with HS coverage   - on lantus 10 units qHS   GERD  Ordered home med protonix 40 mg daily    Insomnia  Trazodone 50 mg qHS PRN   Tobacco use  - nicotine patch   Hx of pancreatitis  - ordered home med creon   anxiety  Ordered home med Gabapentin 900 mg qAM and 1200 mg qHS. and hydroxyzine 10 mg TID PRN  - Gabapentin ordered as 300 mg  TID PRN  - hydroxyzine ordered as 25 mg TID PRN   Bipolar  abilify 5 mg qd   RLS  Ordered home med amitriptyline 50 mg qHS   Dispo: Admit patient to Inpatient with expected length of stay greater than 2 midnights.  Signed: [name], MD 06/05/2016, 5:42 AM  Pager: [phone]

## 2016-06-05 NOTE — ED Notes (Addendum)
Michael Rome, RN on 2 M, 4 g ativan that this RN had pulled to give as needed per PRN order if pt escalated quickly again.  1 mg already wasted in pyxis.  Judson Roch, RN aware.  Pt released from restraints on ankles in full, wrist restraints released from bed during transfer to inpt bed.

## 2016-06-05 NOTE — ED Notes (Signed)
Pt is resting and talking in sleep asking about his money

## 2016-06-05 NOTE — ED Notes (Signed)
Pt no longer fighting restraints and starting to fall asleep.  Pt frequently wakes and continues to verbally threaten staff.  Pt placed back on monitoring equipment.

## 2016-06-05 NOTE — ED Notes (Signed)
Pt stated the he needed to use bathroom. I came to to help him use the urinal PT stated the he wants me to cut him free and find his clothes. I asked put is it ok for me to help him with the urinal he said no he is going to pee on the floor. Pt began to pee on the floor.

## 2016-06-05 NOTE — Progress Notes (Signed)
[facility] Group Notes:  (Nursing/MHT/Case Management/Adjunct)  Date:  06/05/2016  Time:  12:38 AM  Type of Therapy:  wrap up  Participation Level:  Active  Participation Quality:  Intrusive  Affect:  Irritable  Cognitive:  Disorganized, Confused, Delusional and Hallucinating  Insight:  Limited  Engagement in Group:  Distracting and Off Topic  Modes of Intervention:  Discussion  Summary of Progress/Problems:   pt was irritable and disruptive at times during the group. Pt was delusional asking another patient to turn the "beans down before they burn on the stove" pt was agitated stating he was not going to let anyone talk to him any kind of way. Pt was oriented to person / year at this time.    [name] 06/05/2016, 12:38 AM                    [name] A 06/05/2016 12:38 AM

## 2016-06-05 NOTE — ED Notes (Signed)
Pt is hallucinating that he is talking to someone named Sam and asking him where his wallet is.

## 2016-06-05 NOTE — ED Notes (Signed)
I came into Pt room get obtain an EKG.. The Pt refused and stated that he wanted to get the F up and use the bathroom and leave. [facility] tech and I asked if he would not curse at [place]. Pt then became aggressive and stated that he needed to stand up to pee.  Me and the tech from [facility] let him sit on the side of the bed so he could use the urinal. Pt stated that he needs [place] to unhook him and give him his clothes so he could leave. [facility] tech and I redirected him telling him that he cant not leave and that we are trying to help him get better. Pt then stated that he was going to pull everything off and walk out of here. I told Pt that he can not do that or we will have to get security. I then went to inform his nurse Levada Dy about his aggressive behavior and told her that I think we to get security in the room. When returning to the room pt yell at me stating for me to get the F out of his room X2 and then he began to throw his urinal with urine in it at me. Then he grabbed the stool and tossed the stool over at me.  I and the [facility] tech began to call out for security to come and help.

## 2016-06-06 DIAGNOSIS — F319 Bipolar disorder, unspecified: Secondary | ICD-10-CM

## 2016-06-06 DIAGNOSIS — G9340 Encephalopathy, unspecified: Secondary | ICD-10-CM

## 2016-06-06 DIAGNOSIS — G4700 Insomnia, unspecified: Secondary | ICD-10-CM

## 2016-06-06 DIAGNOSIS — E1069 Type 1 diabetes mellitus with other specified complication: Secondary | ICD-10-CM

## 2016-06-06 DIAGNOSIS — F329 Major depressive disorder, single episode, unspecified: Secondary | ICD-10-CM

## 2016-06-06 DIAGNOSIS — Z79899 Other long term (current) drug therapy: Secondary | ICD-10-CM

## 2016-06-06 DIAGNOSIS — G2581 Restless legs syndrome: Secondary | ICD-10-CM

## 2016-06-06 DIAGNOSIS — F10231 Alcohol dependence with withdrawal delirium: Principal | ICD-10-CM

## 2016-06-06 DIAGNOSIS — K861 Other chronic pancreatitis: Secondary | ICD-10-CM

## 2016-06-06 DIAGNOSIS — E876 Hypokalemia: Secondary | ICD-10-CM

## 2016-06-06 LAB — COMPREHENSIVE METABOLIC PANEL
ALBUMIN: 3.4 g/dL — AB (ref 3.5–5.0)
ALT: 42 U/L (ref 17–63)
AST: 34 U/L (ref 15–41)
Alkaline Phosphatase: 101 U/L (ref 38–126)
Anion gap: 7 (ref 5–15)
BUN: 8 mg/dL (ref 6–20)
CHLORIDE: 102 mmol/L (ref 101–111)
CO2: 26 mmol/L (ref 22–32)
CREATININE: 0.53 mg/dL — AB (ref 0.61–1.24)
Calcium: 8.6 mg/dL — ABNORMAL LOW (ref 8.9–10.3)
GFR calc Af Amer: >60 mL/min (ref >60)
GFR calc non Af Amer: >60 mL/min (ref >60)
GLUCOSE: 188 mg/dL — AB (ref 65–99)
POTASSIUM: 3.1 mmol/L — AB (ref 3.5–5.1)
Sodium: 135 mmol/L (ref 135–145)
Total Bilirubin: 0.9 mg/dL (ref 0.3–1.2)
Total Protein: 5.6 g/dL — ABNORMAL LOW (ref 6.5–8.1)

## 2016-06-06 LAB — GLUCOSE, CAPILLARY
GLUCOSE-CAPILLARY: 204 mg/dL — AB (ref 65–99)
GLUCOSE-CAPILLARY: 175 mg/dL — AB (ref 65–99)
Glucose-Capillary: 148 mg/dL — ABNORMAL HIGH (ref 65–99)
Glucose-Capillary: 187 mg/dL — ABNORMAL HIGH (ref 65–99)
Glucose-Capillary: 171 mg/dL — ABNORMAL HIGH (ref 65–99)
Glucose-Capillary: 110 mg/dL — ABNORMAL HIGH (ref 65–99)
Glucose-Capillary: 141 mg/dL — ABNORMAL HIGH (ref 65–99)

## 2016-06-06 LAB — CBC
HEMATOCRIT: 41.6 % (ref 39.0–52.0)
Hemoglobin: 14.1 g/dL (ref 13.0–17.0)
MCH: 31.3 pg (ref 26.0–34.0)
MCHC: 33.9 g/dL (ref 30.0–36.0)
MCV: 92.4 fL (ref 78.0–100.0)
PLATELETS: 103 10*3/uL — AB (ref 150–400)
RBC: 4.5 MIL/uL (ref 4.22–5.81)
RDW: 14 % (ref 11.5–15.5)
WBC: 8 10*3/uL (ref 4.0–10.5)

## 2016-06-06 LAB — MAGNESIUM: MAGNESIUM: 1.6 mg/dL — AB (ref 1.7–2.4)

## 2016-06-06 MED ORDER — LORAZEPAM 2 MG/ML IJ SOLN
1.0000 mg | INTRAMUSCULAR | Status: DC | PRN
  Filled 2016-06-06: qty 1

## 2016-06-06 MED ORDER — ARIPIPRAZOLE 5 MG PO TABS
5.0000 mg | ORAL_TABLET | Freq: Every day | ORAL | Status: DC
  Administered 2016-06-06 – 2016-06-08 (×3): 5 mg via ORAL
  Filled 2016-06-06 (×3): qty 1

## 2016-06-06 MED ORDER — POTASSIUM CHLORIDE 10 MEQ/100ML IV SOLN: 10.0000 meq | INTRAVENOUS | Status: DC

## 2016-06-06 MED ORDER — POTASSIUM CHLORIDE 10 MEQ/100ML IV SOLN
10.0000 meq | INTRAVENOUS | Status: AC
  Administered 2016-06-06 (×4): 10 meq via INTRAVENOUS
  Filled 2016-06-06 (×4): qty 100

## 2016-06-06 MED ORDER — MAGNESIUM SULFATE 2 GM/50ML IV SOLN
2.0000 g | Freq: Once | INTRAVENOUS | Status: AC
  Administered 2016-06-06: 2 g via INTRAVENOUS
  Filled 2016-06-06: qty 50

## 2016-06-06 MED ORDER — LORAZEPAM 2 MG/ML IJ SOLN
2.0000 mg | INTRAMUSCULAR | Status: DC | PRN
  Administered 2016-06-06 – 2016-06-08 (×2): 2 mg via INTRAVENOUS
  Filled 2016-06-06: qty 1

## 2016-06-06 MED ORDER — CHLORDIAZEPOXIDE HCL 25 MG PO CAPS
25.0000 mg | ORAL_CAPSULE | Freq: Three times a day (TID) | ORAL | Status: DC
  Administered 2016-06-06 – 2016-06-08 (×8): 25 mg via ORAL
  Filled 2016-06-06 (×8): qty 1

## 2016-06-06 NOTE — Progress Notes (Signed)
Inpatient Diabetes Program Recommendations  AACE/ADA: New Consensus Statement on Inpatient Glycemic Control (2015)  Target Ranges:  Prepandial:   less than 140 mg/dL      Peak postprandial:   less than 180 mg/dL (1-2 hours)      Critically ill patients:  140 - 180 mg/dL   Lab Results  Component Value Date   GLUCAP 204 (H) 06/06/2016   HGBA1C 11.5 01/14/2016    Review of Glycemic Control:  Results for LOVETT, COFFIN (MRN [id number]) as of 06/06/2016 13:33  Ref. Range 06/05/2016 11:36 06/05/2016 15:31 06/05/2016 19:45 06/05/2016 20:14 06/05/2016 20:34 06/05/2016 20:59 06/05/2016 22:11 06/05/2016 22:42 06/05/2016 23:19 06/05/2016 23:54 06/06/2016 01:03 06/06/2016 01:57 06/06/2016 03:39 06/06/2016 08:07  Glucose-Capillary Latest Ref Range: 65 - 99 mg/dL 235 (H) 118 (H) 52 (L) 55 (L) 76 126 (H) 138 (H) 139 (H) 145 (H) 176 (H) 148 (H) 187 (H) 171 (H) 204 (H)    Diabetes history: Type 2 diabetes Outpatient Diabetes medications: Levemir 32 units q AM, Novolog 10 units with supper Current orders for Inpatient glycemic control:  Novolog sensitive q 4 hours  Inpatient Diabetes Program Recommendations:    Note fluctuations in blood sugars.  Patient was on basal insulin prior to admit.  Once blood sugars consistently >180 mg/dL, may consider restarting low dose basal insulin such as Levemir 10 units daily.    Will follow.  Thanks, Adah Perl, RN, BC-ADM Inpatient Diabetes Coordinator Pager [phone] (8a-5p)

## 2016-06-06 NOTE — Progress Notes (Signed)
   Subjective:  Michael [name] was seen and evaluated today at bedside in SDU. He was sleeping restfully and woke to verbal stimuli. Nursing staff reports he continues to require ativan often in addition to his scheduled librium. Also note that he was agitated overnight as well.   Objective:  Vital signs in last 24 hours: Vitals:   06/06/16 0400 06/06/16 0500 06/06/16 0600 06/06/16 0700  BP: (!) 119/93 134/88 114/68 107/77  Pulse: (!) 117 (!) 127 (!) 124 (!) 118  Resp: (!) 24 (!) 22 (!) 24 18  Temp:      TempSrc:      SpO2: 100% 100% 95% 99%  Weight:      Height:       General: Chronically-ill appearing african [place] male sleeping in bed. Appears comfortable. Awakens to verbal + physical stimuli however quickly falls asleep. RAAS~  -2. Soft restraints HENT: Mucous membranes dry. No thick secretions appreciated.  Cardiovascular: Sinus tachycardia, rate ~110. No MGR appreciated. No carotid bruit appreciated Pulmonary: CTA BL. Normal WOB on RA.  Abdomen: Soft, not distended. +bowel sounds.  Skin: No diaphoresis or flushing. Warm, dry. No cyanosis, suspicious rashes or lesions.   Assessment/Plan:  Principal Problem:   Alcohol withdrawal ([place]) Active Problems:   Type 2 diabetes mellitus with peripheral neuropathy (HCC)   Chronic alcoholic pancreatitis ([place])   Bipolar disorder ([place])  Acute Encephalopathy secondary to Delirium Tremens, Major Depressive Disorder: Admitted 5/9 with alcohol intoxication with SI/HI & developed worsening delirium while at [facility] and was refusing ativan. Received 17 mg of Ativan in past 24 hrs as well as 100 mg total of librium. Appears oversedated however does arouse to verbal stimuli. He is maintaining his own respiratory drive, has normal WOB and is saturating 100% on RA.  -Without metabolic acidosis and AG 7. -Modified CIWA orders, please do not over sedate. RAAS preferably 0, -1.  -Reduce Librium to 25 mg TID -Continue close monitoring in SDU, PCCM aware    -BMET and CBC in AM  Hypokalemia, Hypomagnesemia: 3.1 this morning, mag 1.6. Replete IV 20mEq KCl and 2g IV Mag. FU in AM.   Brittle IDDM-2, complicated by chronic pancreatic insufficiency: Extremely brittle type 2 complicated by chronic pancreatic insufficiency. Hypoglycemic to 50's ON. Insulin regimen adjusted further for patients NPO status.  -DC long-acting -SS only -Caution for hypoglycemia, has had hypoglycemic seizures in past and blood sugars quite labile -Usually on Levemir 32 units QHS at home.  -NPO for now but Creon 36,000 units TID WC  Bipolar disorder, Insomnia, RLS: Continue Abilify 5mg  daily, trazodone 50 mg QHS PRN and amitriptyline 50 mg QHS. He will return to [facility] after stabilization.    Dispo: Anticipated discharge to [facility] once stable from alcohol withdrawal.   Taneisha Fuson, [place], DO 06/06/2016, 7:11 AM Pager: [phone]

## 2016-06-06 NOTE — Clinical Social Work Note (Signed)
Clinical Social Worker received information from RN that patient was previously at [facility] prior to admission.  CSW spoke with charge RN, Otila Kluver at [facility] who is aware of patient hospitalization and plans for patient to return once medically stable as long as bed is available.  CSW remains available for support and to facilitate patient discharge needs once medically stable.  Barbette Or, [place]

## 2016-06-06 NOTE — H&P (Signed)
Date: 06/05/2016               Patient Name:  Michael [name] MRN: [id number]  DOB: 1955/11/24 Age / Sex: 61 y.o., male   PCP: Collier Salina, MD         Medical Service: Internal Medicine Teaching Service         Attending Physician: Dr. Vevelyn Pat    First Contact: Dr. Danford Bad Pager: [phone]  Second Contact: Dr. Benjamine Mola Pager: [phone]       After Hours (After 5p/  First Contact Pager: [phone]  weekends / holidays): Second Contact Pager: [phone]   Chief Complaint: alcohol withdrawal   History of Present Illness:  61 year old man with PMH insulin dependent type 1 DM with peripheral neuropathy, pancreatitis, and seizures presented to the ED from home on 5/9. This history is taken from chart review as the patient is currently encephalopathic. On initial presentation he had EtOH intoxication and was expressing suicidal and homicidal ideations. EtOH level was 248, low K, and hyperglycemia and he was cleared for psych . Psych admitted him on 5/10-5/13, throughout their admission he had signs of withdrawal but was refusing ativan. Yesterday he became more agitated and disoriented and was transferred to the ED.  In the ED he was afebrile 97.8, HR 100s, BP 118/79, SpO2 100% on room air. He received 9 mg of ativan and 1 L NS bolus.   Meds:  Amitriptyline 50 mg qHS  voltaren gel  Duloxetine 60 mg qd  Folic acid daily  Gabapentin 900 mg qAM 1200 qHS  Hydroxyzine 10 mg TID  Insulin aspart 10 units qd  Insulin detemir 32 units qAM  Creon TID  loperimide PRN  Lovastatin 40 mg qHS  protonix 40 mg qd  Vit D 1 capsule weekly   Allergies: Allergies as of 05/31/2016 - Review Complete 05/31/2016  Allergen Reaction Noted  . Ace inhibitors Swelling 02/02/2011  . Losartan Swelling 02/03/2011  . Tylenol [acetaminophen] Other (See Comments) 04/09/2016   Past Medical History:  Diagnosis Date  . Diabetes mellitus without complication ([place])   . Pancreatitis   . Seizures ([place])      Family History:  Family History  Problem Relation Age of Onset  . Hypertension Sister   . Diabetes Sister   . Heart disease Sister   . Colon cancer Neg Hx   . Stomach cancer Neg Hx   . Anesthesia problems Neg Hx   . Hypotension Neg Hx   . Pseudochol deficiency Neg Hx   . Malignant hyperthermia Neg Hx    Social History:  Social History   Social History  . Marital status: Single    Spouse name: N/A  . Number of children: 0  . Years of education: 9   Occupational History  .    [employer]    worked for 6 years  . 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 Smoking status: Current Every Day Smoker    Packs/day: 0.40    Years: 40.00    Types: Cigarettes  . Smokeless tobacco: Never Used     Comment: 5 cigs per day  . Alcohol use 6.6 oz/week    10 Standard drinks or equivalent, 1 Cans of beer per week     Comment: drinks occassionally  . Drug use: No     Comment: 11/17/2014 "maybe once/month"  .  Sexual activity: Not on file   Other Topics Concern  . Not on file   Social History Narrative   Has been living at 3 different friend's homes since discharge 01/2011. Was living in the shelter prior to admission. Had previously lived with his niece and other family members but he has gotten kicked out each time.    Review of Systems: A complete ROS was negative except as per HPI.  Physical Exam: Blood pressure 133/87, pulse (!) 109, temperature 97.4 F (36.3 C), temperature source Oral, resp. rate 18, height 5' 4"  (1.626 m), weight 142 lb (64.4 kg), SpO2 100 %. Physical Exam  Constitutional: He appears well-developed and well-nourished. No distress.  HENT:  Head: Normocephalic and atraumatic.  Moist mucous membranes  Eyes: Conjunctivae are normal. Pupils are equal, round, and reactive to light. Right eye exhibits no discharge. Left eye exhibits no discharge. No scleral icterus.  Cardiovascular: Normal rate  and regular rhythm.   No murmur heard. Pulmonary/Chest: Effort normal. No respiratory distress.  Abdominal: Soft. Bowel sounds are normal. He exhibits no distension. There is no tenderness. There is no guarding.  Neurological:  Snoring and sleeping, arouses to painful stimulus but looks confused and combative   Skin: Skin is warm and dry. He is not diaphoretic.   Labs: Na 139, K 3.7, bicarb 26, BUN 7, crt 0.95, corrected calcium 8.6, glucose 234  Albumin 3.2, AST 32, ALT 43, tbili 0.4, alk phos 141, INR 0.9  WBC 7.4, Hgb 13.7, plt 109 Urinalysis glucose >500, small leukocytes Trop 0.0  UDS positive benzos   EKG: sinus tachycardia, ST changes in V3  CT head: Personal review of the CT head revealed cerebral atrophy   Assessment & Plan by Problem: Principal Problem:   Alcohol withdrawal (HCC) Active Problems:   Bipolar disorder ([place]) Alcohol withdrawal  Patients last drink was 5/9 before he presented to the ED intoxicated. He has been hospitalized through behavioral health since then but was refusing ativan. Became acutely disoriented and agitated today and was transferred back to the ED for EtOH withdrawal management.  - ordered Librium 50 mg to be given when he wakes up  -CIWA monitoring  -thiamine, folic acid and MVM  -seizure precautions  - suicide precautions   Hypocalcemia  Calcium 8.6 corrected for low albumin.   Insulin dependent type 1 Diabetes Mellitis with peripheral neuropathy  Last A1c 12/2015 11.5. On novolog 10 units before dinner and levemir 32 units qAM -on CBG with meals and qHS  -on sensitive ISS with HS coverage   - on lantus 10 units qHS   GERD  Ordered home med protonix 40 mg daily    Insomnia  Trazodone 50 mg qHS PRN   Tobacco use  - nicotine patch   Hx of pancreatitis  - ordered home med creon   anxiety  Ordered home med Gabapentin 900 mg qAM and 1200 mg qHS. and hydroxyzine 10 mg TID PRN  - Gabapentin ordered as 300 mg TID PRN  -  hydroxyzine ordered as 25 mg TID PRN   Bipolar  abilify 5 mg qd   RLS  Ordered home med amitriptyline 50 mg qHS   Dispo: Admit patient to Inpatient with expected length of stay greater than 2 midnights.  SignedEinar Gip, DO 06/05/2016, 2:56 PM   Pager: [phone]

## 2016-06-06 NOTE — Progress Notes (Signed)
Internal Medicine Attending:   I saw and examined the patient. I reviewed the resident's note and I agree with the resident's findings and plan as documented in the resident's note.  61 year old man hospital day #2 with alcohol withdrawal complicated by delirium tremens. Agitation resolved yesterday with high doses of IV Ativan and oral Librium. This morning he is somnolent, awakens to voice, follows simple commands, but falls back asleep quickly. Speech is unintelligible, still delirious. He has very minor signs of autonomic hyperactivity, only tachycardic at this point, no hypertension, no fever, no diaphoresis. I think we can give him less Ativan today. Stop Librium. Try to wake him up more, while keeping him comfortable. Once he is more awake and less delirious, we can re-consult psych to continue working on uncontrolled bipolar disease and disposition.   As a clarification on the charting, there was a mistake in his Epic encounters when transferring to [facility] from [facility], so everything we did since he has came to our ED is currently documented in the previous encounter. Specifically our admission H+P is trapped in the previous encounter, but we have placed a ticket to try to correct this Epic error.

## 2016-06-07 DIAGNOSIS — E108 Type 1 diabetes mellitus with unspecified complications: Secondary | ICD-10-CM

## 2016-06-07 LAB — GLUCOSE, CAPILLARY
GLUCOSE-CAPILLARY: 127 mg/dL — AB (ref 65–99)
GLUCOSE-CAPILLARY: 153 mg/dL — AB (ref 65–99)
Glucose-Capillary: 140 mg/dL — ABNORMAL HIGH (ref 65–99)
Glucose-Capillary: 155 mg/dL — ABNORMAL HIGH (ref 65–99)
Glucose-Capillary: 189 mg/dL — ABNORMAL HIGH (ref 65–99)
Glucose-Capillary: 240 mg/dL — ABNORMAL HIGH (ref 65–99)

## 2016-06-07 LAB — BASIC METABOLIC PANEL
Anion gap: 9 (ref 5–15)
BUN: 8 mg/dL (ref 6–20)
CHLORIDE: 105 mmol/L (ref 101–111)
CO2: 25 mmol/L (ref 22–32)
CREATININE: 0.59 mg/dL — AB (ref 0.61–1.24)
Calcium: 8.8 mg/dL — ABNORMAL LOW (ref 8.9–10.3)
GFR calc Af Amer: >60 mL/min (ref >60)
GFR calc non Af Amer: >60 mL/min (ref >60)
GLUCOSE: 158 mg/dL — AB (ref 65–99)
Potassium: 3.7 mmol/L (ref 3.5–5.1)
Sodium: 139 mmol/L (ref 135–145)

## 2016-06-07 LAB — CBC
HCT: 44.5 % (ref 39.0–52.0)
Hemoglobin: 15.2 g/dL (ref 13.0–17.0)
MCH: 31.9 pg (ref 26.0–34.0)
MCHC: 34.2 g/dL (ref 30.0–36.0)
MCV: 93.5 fL (ref 78.0–100.0)
Platelets: 111 10*3/uL — ABNORMAL LOW (ref 150–400)
RBC: 4.76 MIL/uL (ref 4.22–5.81)
RDW: 14.1 % (ref 11.5–15.5)
WBC: 6 10*3/uL (ref 4.0–10.5)

## 2016-06-07 MED ORDER — ORAL CARE MOUTH RINSE: 15.0000 mL | Freq: Two times a day (BID) | OROMUCOSAL | Status: DC

## 2016-06-07 MED ORDER — CHLORHEXIDINE GLUCONATE 0.12 % MT SOLN
15.0000 mL | Freq: Two times a day (BID) | OROMUCOSAL | Status: DC
  Administered 2016-06-07: 15 mL via OROMUCOSAL
  Filled 2016-06-07 (×2): qty 15

## 2016-06-07 MED ORDER — KETOROLAC TROMETHAMINE 15 MG/ML IJ SOLN
15.0000 mg | Freq: Once | INTRAMUSCULAR | Status: AC
  Administered 2016-06-07: 15 mg via INTRAVENOUS
  Filled 2016-06-07: qty 1

## 2016-06-07 NOTE — Progress Notes (Signed)
Attempted to call report to 5W. RN not available. Will attempt to call again in 10 minutes.

## 2016-06-07 NOTE — Progress Notes (Signed)
Internal Medicine Attending:   I saw and examined the patient. I reviewed the resident's note and I agree with the resident's findings and plan as documented in the resident's note.  Patient is more alert today but remains confused. Initially admitted from behavioral health with acute encephalopathy secondary to likely alcohol withdrawal. Over the last 24 hours patient has received 7 mg of Ativan as well as 75 mg of Librium and appears calm and cooperative. He has not required any Ativan thus far today. Patient stable for transfer out of stepdown unit.  We will continue to monitor him and start him on his oral medications. Once patient starts taking a by mouth diet he likely will require restarting of his basal insulin. We will continue with sliding scale insulin for now. Once stable he'll require discharge to behavioral health for ongoing treatment given his suicidal and homicidal ideation that was present on admission to behavioral health initially.

## 2016-06-07 NOTE — Progress Notes (Signed)
   Subjective: Michael [name] was seen and evaluated today at bedside in SDU. Resting comfortably in bed and alert. No complaints. Still disoriented.   Objective:  Vital signs in last 24 hours: Vitals:   06/07/16 0700 06/07/16 0800 06/07/16 0900 06/07/16 1000  BP: 104/78 114/81 (!) 122/102 (!) 126/95  Pulse: 84 83 89 79  Resp: 16 18 17 15   Temp:      TempSrc:      SpO2: 100% 100% 95% 100%  Weight:      Height:       General: Chronically-ill appearing african [place] male resting in bed. Appears comfortable. Soft restraints. Conversant. NOT oriented.  HENT: Mucous membranes dry. No thick secretions appreciated.  Cardiovascular: RRR. No MGR appreciated. Pulmonary: CTA BL on anterior chest. Normal WOB on RA.  Abdomen: Soft, not distended. +bowel sounds.  Skin: No diaphoresis or flushing. Warm, dry. No cyanosis  Assessment/Plan:  Principal Problem:   Alcohol withdrawal (HCC) Active Problems:   Type 2 diabetes mellitus with peripheral neuropathy (HCC)   Chronic alcoholic pancreatitis (HCC)   Bipolar disorder ([place])  Acute Encephalopathy secondary to Delirium [place]rem[place], Major Depressive Disorder: Admitted 5/9 with alcohol intoxication with SI/HI & developed worsening delirium while at [facility] and was refusing ativan. Received 7 mg of Ativan in past 24 hrs as well as 75 mg total of librium. Appears calm and appropriately sedated today. Normal WOB and is saturating 100% on RA.  -Continue CIWA, patient continuing to require less ativan and CIWA scores 5 or less for past several assessments.  -Librium 25 mg TID -Stable for transfer to med-surg  -BMET and CBC in AM -Taking PO medications well. Will start diet.   Hypokalemia, Hypomagnesemia: Resolved after yesterday replacement. Continue to monitor  Brittle IDDM-2, complicated by chronic pancreatic insufficiency: Extremely brittle type 2 complicated by chronic pancreatic insufficiency. CBGs more adequately controlled today after modifying insulin  regimen.  -Diet, SSI only -Consider long-acting -Caution for hypoglycemia, has had hypoglycemic seizures in past and blood sugars quite labile -Usually on Levemir 32 units QHS at home.  -Creon 36,000 units TID WC  Bipolar disorder, Insomnia, RLS: Continue Abilify 5mg  daily, trazodone 50 mg QHS PRN and amitriptyline 50 mg QHS. He will return to [facility] after stabilization. This has been confirmed with CSW.    Dispo: Anticipated discharge to [facility] once stable from alcohol withdrawal. CSW on board. Appreciate assistance in return to [facility] for ongoing treatment of MDD with SI/HI.   Geniya Fulgham, DO 06/07/2016, 10:57 AM Pager: [phone]

## 2016-06-08 ENCOUNTER — Encounter (HOSPITAL_COMMUNITY): Payer: Self-pay

## 2016-06-08 ENCOUNTER — Emergency Department (HOSPITAL_COMMUNITY): Admission: EM | Admit: 2016-06-08 | Discharge: 2016-06-08 | Discharge status: Absent without leave | Payer: Medicare HMO

## 2016-06-08 ENCOUNTER — Encounter (HOSPITAL_COMMUNITY): Payer: Self-pay | Admitting: General Practice

## 2016-06-08 ENCOUNTER — Inpatient Hospital Stay (HOSPITAL_COMMUNITY)
Admission: AD | Admit: 2016-06-08 | Discharge: 2016-06-09 | DRG: 897 | Disposition: A | Discharge status: Other Acute Inpatient Hospital | Payer: Medicare HMO | Source: Intra-hospital | Attending: Internal Medicine | Admitting: Internal Medicine

## 2016-06-08 ENCOUNTER — Inpatient Hospital Stay (HOSPITAL_COMMUNITY): Payer: Medicare HMO

## 2016-06-08 DIAGNOSIS — E1142 Type 2 diabetes mellitus with diabetic polyneuropathy: Secondary | ICD-10-CM | POA: Diagnosis not present

## 2016-06-08 DIAGNOSIS — K860 Alcohol-induced chronic pancreatitis: Secondary | ICD-10-CM

## 2016-06-08 DIAGNOSIS — E11649 Type 2 diabetes mellitus with hypoglycemia without coma: Secondary | ICD-10-CM | POA: Diagnosis not present

## 2016-06-08 DIAGNOSIS — R270 Ataxia, unspecified: Secondary | ICD-10-CM | POA: Diagnosis not present

## 2016-06-08 DIAGNOSIS — Z794 Long term (current) use of insulin: Secondary | ICD-10-CM | POA: Diagnosis not present

## 2016-06-08 DIAGNOSIS — R45851 Suicidal ideations: Secondary | ICD-10-CM

## 2016-06-08 DIAGNOSIS — K861 Other chronic pancreatitis: Secondary | ICD-10-CM | POA: Diagnosis not present

## 2016-06-08 DIAGNOSIS — K219 Gastro-esophageal reflux disease without esophagitis: Secondary | ICD-10-CM | POA: Diagnosis not present

## 2016-06-08 DIAGNOSIS — Z888 Allergy status to other drugs, medicaments and biological substances status: Secondary | ICD-10-CM

## 2016-06-08 DIAGNOSIS — R4182 Altered mental status, unspecified: Secondary | ICD-10-CM | POA: Diagnosis not present

## 2016-06-08 DIAGNOSIS — E1042 Type 1 diabetes mellitus with diabetic polyneuropathy: Secondary | ICD-10-CM

## 2016-06-08 DIAGNOSIS — E119 Type 2 diabetes mellitus without complications: Secondary | ICD-10-CM | POA: Diagnosis present

## 2016-06-08 DIAGNOSIS — R410 Disorientation, unspecified: Secondary | ICD-10-CM | POA: Diagnosis not present

## 2016-06-08 DIAGNOSIS — Z8249 Family history of ischemic heart disease and other diseases of the circulatory system: Secondary | ICD-10-CM | POA: Diagnosis not present

## 2016-06-08 DIAGNOSIS — Z886 Allergy status to analgesic agent status: Secondary | ICD-10-CM | POA: Diagnosis not present

## 2016-06-08 DIAGNOSIS — R45850 Homicidal ideations: Secondary | ICD-10-CM

## 2016-06-08 DIAGNOSIS — R000 Tachycardia, unspecified: Secondary | ICD-10-CM

## 2016-06-08 DIAGNOSIS — F17210 Nicotine dependence, cigarettes, uncomplicated: Secondary | ICD-10-CM | POA: Diagnosis not present

## 2016-06-08 DIAGNOSIS — I10 Essential (primary) hypertension: Secondary | ICD-10-CM | POA: Diagnosis not present

## 2016-06-08 DIAGNOSIS — R739 Hyperglycemia, unspecified: Secondary | ICD-10-CM | POA: Diagnosis not present

## 2016-06-08 DIAGNOSIS — E512 Wernicke's encephalopathy: Secondary | ICD-10-CM | POA: Diagnosis not present

## 2016-06-08 DIAGNOSIS — F1290 Cannabis use, unspecified, uncomplicated: Secondary | ICD-10-CM | POA: Diagnosis not present

## 2016-06-08 DIAGNOSIS — M170 Bilateral primary osteoarthritis of knee: Secondary | ICD-10-CM | POA: Diagnosis not present

## 2016-06-08 DIAGNOSIS — F419 Anxiety disorder, unspecified: Secondary | ICD-10-CM | POA: Diagnosis not present

## 2016-06-08 DIAGNOSIS — R443 Hallucinations, unspecified: Secondary | ICD-10-CM

## 2016-06-08 DIAGNOSIS — H499 Unspecified paralytic strabismus: Secondary | ICD-10-CM | POA: Diagnosis not present

## 2016-06-08 DIAGNOSIS — Z833 Family history of diabetes mellitus: Secondary | ICD-10-CM | POA: Diagnosis not present

## 2016-06-08 DIAGNOSIS — R404 Transient alteration of awareness: Secondary | ICD-10-CM | POA: Diagnosis not present

## 2016-06-08 DIAGNOSIS — F319 Bipolar disorder, unspecified: Secondary | ICD-10-CM | POA: Diagnosis present

## 2016-06-08 DIAGNOSIS — E10649 Type 1 diabetes mellitus with hypoglycemia without coma: Secondary | ICD-10-CM

## 2016-06-08 DIAGNOSIS — F10239 Alcohol dependence with withdrawal, unspecified: Secondary | ICD-10-CM

## 2016-06-08 DIAGNOSIS — F1010 Alcohol abuse, uncomplicated: Secondary | ICD-10-CM | POA: Diagnosis not present

## 2016-06-08 DIAGNOSIS — Z8673 Personal history of transient ischemic attack (TIA), and cerebral infarction without residual deficits: Secondary | ICD-10-CM | POA: Diagnosis not present

## 2016-06-08 DIAGNOSIS — F209 Schizophrenia, unspecified: Secondary | ICD-10-CM | POA: Diagnosis not present

## 2016-06-08 DIAGNOSIS — F1021 Alcohol dependence, in remission: Secondary | ICD-10-CM | POA: Diagnosis not present

## 2016-06-08 DIAGNOSIS — E1151 Type 2 diabetes mellitus with diabetic peripheral angiopathy without gangrene: Secondary | ICD-10-CM | POA: Diagnosis not present

## 2016-06-08 DIAGNOSIS — E161 Other hypoglycemia: Secondary | ICD-10-CM | POA: Diagnosis not present

## 2016-06-08 LAB — GLUCOSE, CAPILLARY
GLUCOSE-CAPILLARY: 302 mg/dL — AB (ref 65–99)
GLUCOSE-CAPILLARY: 61 mg/dL — AB (ref 65–99)
GLUCOSE-CAPILLARY: 77 mg/dL (ref 65–99)
Glucose-Capillary: 209 mg/dL — ABNORMAL HIGH (ref 65–99)
Glucose-Capillary: 259 mg/dL — ABNORMAL HIGH (ref 65–99)
Glucose-Capillary: 72 mg/dL (ref 65–99)

## 2016-06-08 LAB — CBG MONITORING, ED
GLUCOSE-CAPILLARY: 134 mg/dL — AB (ref 65–99)
Glucose-Capillary: 121 mg/dL — ABNORMAL HIGH (ref 65–99)

## 2016-06-08 MED ORDER — LORAZEPAM 1 MG PO TABS
0.0000 mg | ORAL_TABLET | Freq: Four times a day (QID) | ORAL | Status: DC
  Administered 2016-06-09: 2 mg via ORAL
  Filled 2016-06-08: qty 2

## 2016-06-08 MED ORDER — THIAMINE HCL 100 MG/ML IJ SOLN: 100.0000 mg | Freq: Every day | INTRAMUSCULAR | Status: DC

## 2016-06-08 MED ORDER — INSULIN ASPART 100 UNIT/ML ~~LOC~~ SOLN: 0.0000 [IU] | Freq: Three times a day (TID) | SUBCUTANEOUS | 11 refills | Status: DC

## 2016-06-08 MED ORDER — INSULIN ASPART 100 UNIT/ML ~~LOC~~ SOLN
5.0000 [IU] | Freq: Once | SUBCUTANEOUS | Status: AC
  Administered 2016-06-08: 5 [IU] via SUBCUTANEOUS

## 2016-06-08 MED ORDER — LORAZEPAM 1 MG PO TABS: 0.0000 mg | ORAL_TABLET | Freq: Two times a day (BID) | ORAL | Status: DC

## 2016-06-08 MED ORDER — CHLORDIAZEPOXIDE HCL 25 MG PO CAPS: ORAL_CAPSULE | ORAL | 0 refills | Status: DC

## 2016-06-08 MED ORDER — VITAMIN B-1 100 MG PO TABS: 100.0000 mg | ORAL_TABLET | Freq: Every day | ORAL | Status: DC

## 2016-06-08 MED ORDER — INSULIN DETEMIR 100 UNIT/ML FLEXPEN: 5.0000 [IU] | PEN_INJECTOR | Freq: Every morning | SUBCUTANEOUS | 1 refills | Status: DC

## 2016-06-08 MED ORDER — INSULIN ASPART 100 UNIT/ML ~~LOC~~ SOLN
0.0000 [IU] | Freq: Three times a day (TID) | SUBCUTANEOUS | Status: DC
  Administered 2016-06-08: 3 [IU] via SUBCUTANEOUS
  Administered 2016-06-08: 5 [IU] via SUBCUTANEOUS

## 2016-06-08 NOTE — ED Provider Notes (Signed)
[name] DEPT Provider Note   CSN: [id number] Arrival date & time: 06/08/16  1952 By signing my name below, I, Dyke Brackett, attest that this documentation has been prepared under the direction and in the presence of non-physician practitioner, Abigail Butts, PA-C. Electronically Signed: Dyke Brackett, Scribe. 06/08/2016. 10:34 PM.    History   Chief Complaint Chief Complaint  Patient presents with  . Altered Mental Status   LEVEL 5 CAVEAT DUE TO AMS   HPI Michael [name] is a 61 y.o. male with a history of Bipolar disorder, chronic alcoholic pancreatitis, schizophrenia, and DM type 2 who presents to the Emergency Department from [facility] for evaluation of Altered Mental Status onset today. Per chart review, pt was discharged from [facility] today after being admitted for alcohol withdrawal. He was to return to [facility] for continued treatment of psychiatric disorders, but was sent here for evaluation of AMS. When he arrived at [facility], he was confused and smelled of urine. Pt reports associated auditory and visual hallucinations.  He reports that he has hallucinations of someone standing in the corner, and expresses he has had this for a long time. Pt states that he talks to whoever he sees. Per family member, pt has been calling out people's names and eating imaginary foods. Pt also reports epigastric LUQ pain that radiates to his back onset last night. He notes associated nausea and loose stools, but denies any vomiting or hematochezia.   The history is provided by medical records, the patient and a relative. No language interpreter was used.   Past Medical History:  Diagnosis Date  . Anxiety   . Arthritis    "joints; shoulders; feet" (06/08/2016)  . Bipolar disorder ([place])   . Daily headache    "7:30 - 8:00 q night" (06/08/2016)  . Diabetic peripheral neuropathy ([place])   . GERD (gastroesophageal reflux disease)   . Pancreatitis   . Schizophrenia ([place])   . Seizures ([place])  01/2016 X 2  . Type II diabetes mellitus [facility])     Patient Active Problem List   Diagnosis Date Noted  . Delirium 06/09/2016  . Alcohol withdrawal ([place]) 06/05/2016  . Bipolar disorder ([place]) 06/01/2016  . Diabetes mellitus due to underlying condition with ketoacidosis without coma, with long-term current use of insulin ([place]) 02/04/2016  . Dry skin 01/18/2016  . Peripheral vascular disease ([place]) 11/15/2015  . Tobacco abuse 09/02/2015  . Tricompartment osteoarthritis of both knees 06/15/2015  . Chronic abdominal pain 03/14/2015  . Vitamin D deficiency 03/12/2015  . Hyperlipidemia 02/17/2015  . Fall 12/24/2014  . Protein-calorie malnutrition, severe 11/18/2014  . Alcohol use disorder ([place])   . Cervical arthritis ([place]) 01/01/2014  . Health care maintenance 08/25/2011  . Chronic alcoholic pancreatitis ([place]) 04/20/2011  . Type 2 diabetes mellitus with peripheral neuropathy ([place]) 12/09/2010  . HTN (hypertension) 12/09/2010    Past Surgical History:  Procedure Laterality Date  . BALLOON DILATION  03/22/2011   Procedure: BALLOON DILATION;  Surgeon: Gatha Mayer, MD;  Location: [facility] ENDOSCOPY;  Service: Endoscopy;  Laterality: N/A;  . COLONOSCOPY N/A 05/22/2012   Procedure: COLONOSCOPY;  Surgeon: Gatha Mayer, MD;  Location: [place];  Service: Endoscopy;  Laterality: N/A;  . COLONOSCOPY W/ BIOPSIES AND POLYPECTOMY  09/12/11  . ESOPHAGOGASTRODUODENOSCOPY  03/22/2011   Procedure: ESOPHAGOGASTRODUODENOSCOPY (EGD);  Surgeon: Gatha Mayer, MD;  Location: [facility];  Service: Endoscopy;  Laterality: N/A;  egd with balloon   . EUS  04/20/2011   Procedure: UPPER ENDOSCOPIC  ULTRASOUND (EUS) LINEAR;  Surgeon: Milus Banister, MD;  Location: [facility] ENDOSCOPY;  Service: Endoscopy;  Laterality: N/A;  . KNEE ARTHROSCOPY Left     Home Medications    Prior to Admission medications   Medication Sig Start Date End Date Taking? Authorizing Provider  amitriptyline (ELAVIL) 50 MG tablet Take 50 mg by  mouth at bedtime. 01/07/16  Yes [provider]  chlordiazePOXIDE (LIBRIUM) 25 MG capsule Take 1 capsule (25mg ) twice daily for 2 days starting 06/09/16. Then, take 1 capsule (25 mg) once daily for 2 days. 06/08/16  Yes Molt, Bethany, DO  DULoxetine (CYMBALTA) 60 MG capsule TAKE 1 CAPSULE EVERY DAY 05/16/16  Yes Rice, Resa Miner, MD  gabapentin (NEURONTIN) 300 MG capsule TAKE 3 CAPSULES BY MOUTH  EVERY MORNING AND  AFTERNOON. TAKE 4 CAPSULES  BY MOUTH AT NIGHT BEFORE  BED 03/10/16  Yes Rice, Resa Miner, MD  hydrOXYzine (ATARAX/VISTARIL) 10 MG tablet Take 1 tablet (10 mg total) by mouth 3 (three) times daily as needed for itching. Patient taking differently: Take 10 mg by mouth 3 (three) times daily as needed for itching or anxiety.  03/17/16  Yes Rice, Resa Miner, MD  ibuprofen (ADVIL,MOTRIN) 800 MG tablet Take 800 mg by mouth 3 (three) times daily as needed. 05/31/16  Yes [provider]  insulin aspart (NOVOLOG) 100 UNIT/ML injection Inject 0-9 Units into the skin 3 (three) times daily with meals. 06/08/16  Yes Molt, Bethany, DO  Insulin Detemir (LEVEMIR FLEXTOUCH) 100 UNIT/ML Pen Inject 5 Units into the skin every morning. 06/08/16  Yes Molt, Bethany, DO  lipase/protease/amylase (CREON) 36000 UNITS CPEP capsule TAKE 1 CAPSULE BY MOUTH 3  TIMES DAILY BEFORE MEALS 03/02/16  Yes Rice, Resa Miner, MD  lovastatin (MEVACOR) 40 MG tablet Take 1 tablet (40 mg total) by mouth at bedtime. 03/02/16  Yes Rice, Resa Miner, MD  pantoprazole (PROTONIX) 40 MG tablet Take 1 tablet (40 mg total) by mouth daily. 03/02/16  Yes Rice, Resa Miner, MD  ARIPiprazole (ABILIFY) 5 MG tablet Take 5 mg by mouth daily.    [provider]  diclofenac sodium (VOLTAREN) 1 % GEL Apply 4 g topically 4 (four) times daily. Patient not taking: Reported on 06/08/2016 11/15/15   Shela Leff, MD    Family History Family History  Problem Relation Age of Onset  . Hypertension Sister   . Diabetes  Sister   . Heart disease Sister   . Colon cancer Neg Hx   . Stomach cancer Neg Hx   . Anesthesia problems Neg Hx   . Hypotension Neg Hx   . Pseudochol deficiency Neg Hx   . Malignant hyperthermia Neg Hx     Social History Social History  Substance Use Topics  . Smoking status: Current Every Day Smoker    Packs/day: 0.25    Years: 42.00    Types: Cigarettes  . Smokeless tobacco: Never Used     Comment: 5 cigs per day  . Alcohol use 36.0 oz/week    10 Standard drinks or equivalent, 50 Shots of liquor per week     Comment: 06/08/2016 "1/5th liquor maybe 2 days/week"     Allergies   Ace inhibitors; Losartan; and Tylenol [acetaminophen]   Review of Systems Review of Systems  Unable to perform ROS: Mental status change  Gastrointestinal: Positive for abdominal pain.  Psychiatric/Behavioral: Positive for hallucinations.   Physical Exam Updated Vital Signs BP 109/81 (BP Location: Right Arm)   Pulse 96   Temp 97.8  F (36.6 C) (Oral)   Resp 16   Ht 5\' 8"  (1.727 m)   Wt 160 lb (72.6 kg)   SpO2 98%   BMI 24.33 kg/m   Physical Exam  Constitutional: He is oriented to person, place, and time. He appears well-developed and well-nourished. No distress.  Awake, alert, nontoxic appearance  HENT:  Head: Normocephalic and atraumatic.  Mouth/Throat: Oropharynx is clear and moist. No oropharyngeal exudate.  Eyes: Conjunctivae are normal. No scleral icterus.  Neck: Normal range of motion. Neck supple.  Cardiovascular: Regular rhythm and intact distal pulses.  Tachycardia present.   Pulses:      Radial pulses are 2+ on the right side, and 2+ on the left side.  Pulmonary/Chest: Effort normal and breath sounds normal. No respiratory distress. He has no wheezes.  Equal chest expansion  Abdominal: Soft. Bowel sounds are normal. He exhibits no mass. There is no tenderness. There is no rebound and no guarding.  Musculoskeletal: Normal range of motion. He exhibits no edema.    Neurological: He is alert and oriented to person, place, and time.  Speech is clear and goal oriented Moves extremities without ataxia No tremors  Skin: Skin is warm and dry. He is not diaphoretic.  Psychiatric: He has a normal mood and affect. He is actively hallucinating.  Pt reaching for things unseen, attempting to throw things in his hand that are not there   Nursing note and vitals reviewed.   ED Treatments / Results  DIAGNOSTIC STUDIES:  Oxygen Saturation is 98% on RA, normal by my interpretation.    COORDINATION OF CARE:  10:31 PM Discussed treatment plan with pt and family member at bedside.  Labs (all labs ordered are listed, but only abnormal results are displayed) Labs Reviewed  GLUCOSE, CAPILLARY - Abnormal; Notable for the following:       Result Value   Glucose-Capillary 61 (*)    All other components within normal limits  COMPREHENSIVE METABOLIC PANEL - Abnormal; Notable for the following:    Glucose, Bld 225 (*)    All other components within normal limits  CBC WITH DIFFERENTIAL/PLATELET - Abnormal; Notable for the following:    Hemoglobin 17.6 (*)    All other components within normal limits  RAPID URINE DRUG SCREEN, HOSP PERFORMED - Abnormal; Notable for the following:    Benzodiazepines POSITIVE (*)    All other components within normal limits  URINALYSIS, ROUTINE W REFLEX MICROSCOPIC - Abnormal; Notable for the following:    Glucose, UA >=500 (*)    Ketones, ur 5 (*)    All other components within normal limits  CBG MONITORING, ED - Abnormal; Notable for the following:    Glucose-Capillary 121 (*)    All other components within normal limits  CBG MONITORING, ED - Abnormal; Notable for the following:    Glucose-Capillary 134 (*)    All other components within normal limits  GLUCOSE, CAPILLARY  ETHANOL  AMMONIA  LIPASE, BLOOD  I-STAT CG4 LACTIC ACID, ED  I-STAT CG4 LACTIC ACID, ED    Radiology Ct Head Wo Contrast  Result Date:  06/08/2016 CLINICAL DATA:  Altered mental status EXAM: CT HEAD WITHOUT CONTRAST TECHNIQUE: Contiguous axial images were obtained from the base of the skull through the vertex without intravenous contrast. COMPARISON:  06/05/2016 FINDINGS: Brain: No acute territorial infarction, hemorrhage or intracranial mass is visualized. Periventricular and subcortical white matter hypodensities consistent with small vessel ischemic change. Mild atrophy. Stable ventricle size. Vascular: No hyperdense vessels.  Carotid  artery calcifications. Skull: No fracture or suspicious bone lesion. Small fluid in the inferior mastoids. Sinuses/Orbits: Clear paranasal sinuses. No acute orbital abnormality. Other: None IMPRESSION: No CT evidence for acute intracranial abnormality. Moderate small vessel ischemic changes of the white matter along with atrophy. Electronically Signed   By: Donavan Foil M.D.   On: 06/08/2016 23:29   Dg Chest Port 1 View  Result Date: 06/08/2016 CLINICAL DATA:  Altered mental status EXAM: PORTABLE CHEST 1 VIEW COMPARISON:  09/02/2015 FINDINGS: The heart size and mediastinal contours are within normal limits. Both lungs are clear. The visualized skeletal structures are unremarkable. IMPRESSION: No active disease. Electronically Signed   By: Donavan Foil M.D.   On: 06/08/2016 22:27    Procedures Procedures (including critical care time)  Medications Ordered in ED Medications  thiamine (VITAMIN B-1) tablet 100 mg (not administered)    Or  thiamine (B-1) injection 100 mg (not administered)  LORazepam (ATIVAN) tablet 0-4 mg (2 mg Oral Given 06/09/16 0115)    Followed by  LORazepam (ATIVAN) tablet 0-4 mg (not administered)     Initial Impression / Assessment and Plan / ED Course  I have reviewed the triage vital signs and the nursing notes.  Pertinent labs & imaging results that were available during my care of the patient were reviewed by me and considered in my medical decision making (see chart  for details).  Clinical Course as of Jun 09 609  Fri Jun 09, 2016  0424 CIWA 14.  Ativan given with somewhat improved mental status, but persistent tachycardia  [HM]  0527 Discussed with Dr. Wynetta Emery of Internal Medicine Teaching Service. Attending MD, Dareen Piano.      [HM]    Clinical Course User Index [HM] Aimar Borghi, Jarrett Soho, PA-C    Patient presents with persistent delirium after discharge from the hospital today. Labs are reassuring. CT scan is without acute abnormality. No evidence of UTI or other infection.  Patient's initial blood sugar was 61. Notes state patient often has seizure activity when he becomes hypoglycemic and normal blood sugars are in the 200s. His mentation improved somewhat with improving blood sugar but his hallucinations do not resolve. CIWA score is 14. Patient persistently tachycardic despite fluids. Patient given Ativan. Concerned that he continues to withdraw versus persistent delirium for additional reasons. Patient will be readmitted to internal medicine.  He is resting comfortably at this time.  The patient was discussed with and seen by Dr. Wilson Singer early in the work-up who agrees with the plan.  The patient was discussed with and seen by Dr. Randal Buba prior to transfer who agrees with the admission plan.    Final Clinical Impressions(s) / ED Diagnoses   Final diagnoses:  Delirium  Tachycardia  Hallucinations    New Prescriptions New Prescriptions   No medications on file   I personally performed the services described in this documentation, which was scribed in my presence. The recorded information has been reviewed and is accurate.'   Loni Muse Gwenlyn Perking 06/09/16 3710    Virgel Manifold, MD 06/17/16 [phone]

## 2016-06-08 NOTE — Progress Notes (Signed)
At d/c patient became agitated and unreceptive to going back to [facility]. Eventually patient was coaxed into returning back to the [facility] with [facility] transporter.

## 2016-06-08 NOTE — Progress Notes (Signed)
Repeat of Michael [name]'s blood sugar was 72.  EMS staff by side to transport him to [facility].

## 2016-06-08 NOTE — Progress Notes (Signed)
Pt to be DC to [facility]. Report called to [facility] RN and all questions answered. Pt being transferred by [facility] to [facility] facility.

## 2016-06-08 NOTE — ED Notes (Signed)
Bed: WA02 Expected date:  Expected time:  Means of arrival:  Comments: 61 yr old M from [place], [place]

## 2016-06-08 NOTE — Progress Notes (Signed)
Patient CBG was 302. On call MD notified and 5 units of novolog was ordered.  RN gave this medication.  Will continue to monitor patient and notify as needed.

## 2016-06-08 NOTE — Progress Notes (Signed)
Inpatient Diabetes Program Recommendations  AACE/ADA: New Consensus Statement on Inpatient Glycemic Control (2015)  Target Ranges:  Prepandial:   less than 140 mg/dL      Peak postprandial:   less than 180 mg/dL (1-2 hours)      Critically ill patients:  140 - 180 mg/dL   Results for Michael [name], Michael [name] (MRN [id number]) as of 06/08/2016 10:27  Ref. Range 06/07/2016 08:04 06/07/2016 11:41 06/07/2016 16:50 06/07/2016 20:09 06/08/2016 00:20 06/08/2016 08:10  Glucose-Capillary Latest Ref Range: 65 - 99 mg/dL 127 (H) 153 (H) 189 (H) 240 (H) 302 (H) 209 (H)   Review of Glycemic Control  Diabetes history: DM2 Outpatient Diabetes medications: Levemir 32 units QAM, Novolog 10 units QPM with supper Current orders for Inpatient glycemic control: Novolog 0-9 units TID with meals  Inpatient Diabetes Program Recommendations: Insulin - Basal: Please consider ordering Levemir 7 units Q24H (based on 72 kg x 0.1 units). Correction (SSI): Please consider ordering Novolog 0-5 units QHS for bedtime correction scale.  Thanks, Barnie Alderman, RN, MSN, CDE Diabetes Coordinator Inpatient Diabetes Program [phone] (Team Pager from 8am to 5pm)

## 2016-06-08 NOTE — ED Triage Notes (Signed)
Patient here from [facility] for altered mental status. Per EMS report patient was "eating things that were not there" CBG at [facility] was 61, ems states patient ate food after that at [facility]. Upon assessment patient is sleeping, responds to verbal stimuli, and lethargic with speech and movement.

## 2016-06-08 NOTE — Progress Notes (Signed)
Michael [name] arrived to [facility] from [place] floor.  He was in a wheel chair and unable to ambulate.  Three staff members had to assist him to stand up to complete skin assessment.  He was unable to proceed with admission process.  Attempted to review paperwork but he was unable to sign, taking the pen and writing in the air.  He was oriented x 2 and unable to understand why he was here at [name].  RN asked why he was in the hospital for the past few days and he was unable to answer.  He stated that he got to the hospital because he was trying to walk to the store but couldn't elaborate.  He was noted trying to eat a  non existent piece of fried chicken.  He was agitated with multiple questions and became upset because he was trying to reach his wallet in his back pocket.  He was assisted on the unit and taken to his room.  Two staff members assisted him in bed.  1:1 sitter was by side to assure safety.  Staffed with [name] and [name] who agreed that he needs further evaluation since it was reported to [name] that he was ambulatory, able to take care of his personal needs on his own and confused at times.

## 2016-06-08 NOTE — Discharge Summary (Signed)
Name: Michael [name] MRN: [id number] DOB: [birth year] 61 y.o. PCP: Michael Salina, MD  Date of Admission: 06/05/2016 11:36 AM Date of Discharge: 06/08/2016 Attending Physician: Michael Contes, MD  Discharge Diagnosis: 1. Alcohol withdrawal 2. Type 2 diabetes, Extremely Brittle with hypoglycemic Seizures 2. Bipolar disorder, recent suicidal and homicidal ideation  Principal Problem:   Alcohol withdrawal (Michael [name]) Active Problems:   Type 2 diabetes mellitus with peripheral neuropathy ([place])   Chronic alcoholic pancreatitis ([place])   Bipolar disorder ([place])  Discharge Medications: Allergies as of 06/08/2016      Reactions   Ace Inhibitors Swelling   Angioedema - unquestionable   Losartan Swelling   Pt presented with unquestionable angioedema on ACEIs (Angiotensin-converting enzyme (ACE) inhibitors) so aviod ARBs (Angiotensin receptor blockers), as well   Tylenol [acetaminophen] Other (See Comments)   "cannot have this" (contraindicated)      Medication List    STOP taking these medications   insulin aspart 100 UNIT/ML FlexPen Commonly known as:  NOVOLOG FLEXPEN Replaced by:  insulin aspart 100 UNIT/ML injection     TAKE these medications   amitriptyline 50 MG tablet Commonly known as:  ELAVIL Take 50 mg by mouth at bedtime.   ARIPiprazole 5 MG tablet Commonly known as:  ABILIFY Take 5 mg by mouth daily.   chlordiazePOXIDE 25 MG capsule Commonly known as:  LIBRIUM Take 1 capsule (25mg ) twice daily for 2 days starting 06/09/16. Then, take 1 capsule (25 mg) once daily for 2 days.   diclofenac sodium 1 % Gel Commonly known as:  VOLTAREN Apply 4 g topically 4 (four) times daily. What changed:  additional instructions   DULoxetine 60 MG capsule Commonly known as:  CYMBALTA TAKE 1 CAPSULE EVERY DAY   gabapentin 300 MG capsule Commonly known as:  NEURONTIN TAKE 3 CAPSULES BY MOUTH  EVERY MORNING AND  AFTERNOON. TAKE 4 CAPSULES  BY MOUTH AT NIGHT BEFORE  BED     hydrOXYzine 10 MG tablet Commonly known as:  ATARAX/VISTARIL Take 1 tablet (10 mg total) by mouth 3 (three) times daily as needed for itching. What changed:  reasons to take this   insulin aspart 100 UNIT/ML injection Commonly known as:  novoLOG Inject 0-9 Units into the skin 3 (three) times daily with meals. Replaces:  insulin aspart 100 UNIT/ML FlexPen   Insulin Detemir 100 UNIT/ML Pen Commonly known as:  LEVEMIR FLEXTOUCH Inject 5 Units into the skin every morning. What changed:  how much to take   lipase/protease/amylase 36000 UNITS Cpep capsule Commonly known as:  CREON TAKE 1 CAPSULE BY MOUTH 3  TIMES DAILY BEFORE MEALS   lovastatin 40 MG tablet Commonly known as:  MEVACOR Take 1 tablet (40 mg total) by mouth at bedtime.   pantoprazole 40 MG tablet Commonly known as:  PROTONIX Take 1 tablet (40 mg total) by mouth daily.      Disposition and follow-up:   Michael [name] was discharged from [facility] in Stable condition.  At the hospital follow up visit please address:  1.  Alcohol withdrawal: Patient was discharged home with Librium taper for 4 days. He will require Librium 25 mg twice daily for 2 days followed by Librium 25 mg once daily for 2 days. Please encourage patient's continued abstinence from alcohol as this seems to exacerbate his psychiatric conditions. Type 2 diabetes: He is an extremely brittle type 2 diabetic and goal CBGs for this patient is ~200. He has had history of hypoglycemic seizures and  utmost care must be made to avoid this. In addition, he is also very sensitive to insulin. His blood sugars were maintained well with sensitive sliding scale insulin. Low dose basal insulin was added as well at time of discharge. This may need to be adjusted (with consideration of the above) if CBGs persistently >300.  2.  Labs / imaging needed at time of follow-up: CBGs 4 times a day with meals  3.  Pending labs/ test needing follow-up:  None  Hospital Course by problem list: Principal Problem:   Alcohol withdrawal ([place]) Active Problems:   Type 2 diabetes mellitus with peripheral neuropathy ([place])   Chronic alcoholic pancreatitis ([place])   Bipolar disorder ([place])   1. Alcohol withdrawal Admitted originally 5/92 [place] with alcohol intoxication, suicidal and homicidal ideations. During his time at [facility], he began to develop agitation and symptoms consistent with alcohol withdrawal. He was subsequently transferred and admitted to [facility] for management. He was started on see off protocol and required large doses of Ativan. He was also started on Librium as well. External days he required considerably less Ativan and was eventually tapered off. At time of discharge she was alert, oriented 3 and appeared to be at baseline. He was discharged back to [place] to complete the remainder of his therapy with a Librium taper.  2. Brittle type 2 diabetes, complicated by treated by peripheral neuropathy and frequent hypoglycemia. Chronic pancreatic insufficiency. He's had a history of SEVERAL hypoglycemic seizures in the past and utmost care must be had to attempt to prevent this. His situation is also complicated by chronic pancreatic insufficiency. He did well during hospitalization while on sliding scale insulin.   3. Bipolar Disorder Patient to return to [facility] for continued therapy.   Discharge Vitals:   BP 114/82 (BP Location: Right Arm)   Pulse 87   Temp 98.7 F (37.1 C)   Resp 18   Ht 5\' 5"  (1.651 m)   Wt 157 lb 13.6 oz (71.6 kg)   SpO2 100%   BMI 26.27 kg/m   Discharge Instructions: Michael [name] was admitted for alcohol withdrawal. He was treated with benzodiazepines and has returned to baseline. He is completing a very low dose taper of long-acting benzodiazepines to ensure smooth transition. He will return to [facility] for continued treatment and management of his underlying psychiatric  disorders.  Of note, his long-acting insulin was discontinued during admission due to hypoglycemia. He was discharged home with sliding scale insulin and had been doing well. Levemir 5 units had been added at time of discharge.   SignedEinar Gip, DO 06/08/2016, 1:59 PM   Pager: [phone]

## 2016-06-08 NOTE — ED Notes (Signed)
Per [facility], this patient was suppose to go back to [name] for evaluation, he was sent to [facility] from there and they were told he could perform all his ADL's and was alert and ambulatory, when pt arrived at [facility] he was slumped over in a wheelchair, smelled of urine and very confused. [facility] told EMS that he was to be transported back to [place] and they came to [place] instead.

## 2016-06-08 NOTE — ED Notes (Signed)
Patient has Air cabin crew from [facility] at bedside.

## 2016-06-08 NOTE — Progress Notes (Signed)
Patient will DC to: [facility] Anticipated DC date: 06/08/16 Family notified: Patient requested CSW to call sister Transport by: Betsy Pries   Per MD patient ready for DC to [facility]. RN, patient, patient's family, and facility notified of DC. Discharge Summary on chart to go with patient. RN given number for report [phone] Room 301 bed 2). [facility] transport scheduled for this afternoon.  CSW signing off.  Cedric Fishman, [place] Social Worker [phone]

## 2016-06-08 NOTE — Progress Notes (Signed)
   Subjective: [name] was seen and evaluated today at bedside. He was about to have breakfast in his bedside chair. He has no complaints and wants to go back to [facility] today. Eating and drinking well and able to ambulate around room.   Objective:  Vital signs in last 24 hours: Vitals:   06/07/16 1145 06/07/16 1246 06/07/16 2015 06/08/16 0408  BP:  107/74 110/82 114/82  Pulse:  91 (!) 117 87  Resp:  18 18 18   Temp: 97.3 F (36.3 C) 97.6 F (36.4 C) 97.8 F (36.6 C) 98.7 F (37.1 C)  TempSrc: Oral     SpO2:  100% 100% 100%  Weight:      Height:       General: Chronically-ill appearing AA male resting comfortably in bedside chair. About to eat breakfast. Conversant. Alert and oriented x3. Cardiovascular: RRR. No MGR appreciated. Pulmonary: CTA BL.Normal WOB on RA.  Abdomen: Soft, not distended. +bowel sounds.  Skin: No diaphoresis or flushing. Warm, dry. No cyanosis  Assessment/Plan:  Principal Problem:   Alcohol withdrawal (HCC) Active Problems:   Type 2 diabetes mellitus with peripheral neuropathy (HCC)   Chronic alcoholic pancreatitis (HCC)   Bipolar disorder ([place])  Acute Encephalopathy secondary to Delirium Tremens, Major Depressive Disorder: Admitted 5/9 with alcohol intoxication with SI/HI & developed worsening delirium while at [facility] and was refusing ativan. Received 2 mg of Ativan in past 24 hrs as well as 75 mg total of librium. He is calm and oriented. He is medically ready for discharge back to [facility]. We will send him with a clearly defined librium taper which starts today (25 mg BID).  -Librium 25 mg BID -Transfer back to [facility]. Have already contact CSW who are facilitating   Brittle IDDM-2, complicated by chronic pancreatic insufficiency: Extremely brittle type 2 complicated by chronic pancreatic insufficiency. CBGs actually well controlled on sliding scale (in context of patients history of frequent hypoglycemic events)  -Diet, SSI only -Consider long-acting, was on  levemir 32 units at home -Caution for hypoglycemia, has had hypoglycemic seizures in past and blood sugars quite labile -Creon 36,000 units TID WC  Bipolar disorder, Insomnia, RLS: Continue Abilify 5mg  daily, trazodone 50 mg QHS PRN and amitriptyline 50 mg QHS. He will return to [facility] hopefully today.    Dispo: Stable for DC back to [facility] today. CSW on board, and we appreciate their assistance.   Jolina Symonds, DO 06/08/2016, 10:57 AM Pager: [phone]

## 2016-06-08 NOTE — Progress Notes (Signed)
Michael [name]'s blood sugar was 61.  One cup of Gatorade given with assistance from RN to hold the cup up to his mouth.  He also needed assistance in holding his back so he wouldn't fall over in bed. EMS called for transportation to [facility] for evaluation of AMS.

## 2016-06-08 NOTE — ED Notes (Signed)
Pt unable to give urine sample, but aware that we need one.  

## 2016-06-08 NOTE — Progress Notes (Signed)
Internal Medicine Attending:   I saw and examined the patient. I reviewed the resident's note and I agree with the resident's findings and plan as documented in the resident's note.  Patient feels well today with no new complaints. He is much more alert and oriented today and is tolerating an oral diet. Patient was initially admitted from behavioral health for altered mental status/delirium likely secondary to alcohol withdrawal. Patient is now much improved and has only required 2 mg of Ativan in the last 48 hours. We will slowly taper patient off his Librium and transfer him back to behavioral health today.

## 2016-06-09 ENCOUNTER — Observation Stay (HOSPITAL_COMMUNITY)
Admission: AD | Admit: 2016-06-09 | Discharge: 2016-06-13 | Disposition: A | Discharge status: Home Health | Payer: Medicare HMO | Source: Ambulatory Visit | Attending: Internal Medicine | Admitting: Internal Medicine

## 2016-06-09 DIAGNOSIS — K861 Other chronic pancreatitis: Secondary | ICD-10-CM

## 2016-06-09 DIAGNOSIS — F209 Schizophrenia, unspecified: Secondary | ICD-10-CM | POA: Diagnosis not present

## 2016-06-09 DIAGNOSIS — F17210 Nicotine dependence, cigarettes, uncomplicated: Secondary | ICD-10-CM | POA: Diagnosis not present

## 2016-06-09 DIAGNOSIS — E1151 Type 2 diabetes mellitus with diabetic peripheral angiopathy without gangrene: Secondary | ICD-10-CM | POA: Insufficient documentation

## 2016-06-09 DIAGNOSIS — E512 Wernicke's encephalopathy: Secondary | ICD-10-CM | POA: Diagnosis not present

## 2016-06-09 DIAGNOSIS — Z886 Allergy status to analgesic agent status: Secondary | ICD-10-CM | POA: Insufficient documentation

## 2016-06-09 DIAGNOSIS — K860 Alcohol-induced chronic pancreatitis: Secondary | ICD-10-CM | POA: Diagnosis not present

## 2016-06-09 DIAGNOSIS — Z8249 Family history of ischemic heart disease and other diseases of the circulatory system: Secondary | ICD-10-CM

## 2016-06-09 DIAGNOSIS — Z8673 Personal history of transient ischemic attack (TIA), and cerebral infarction without residual deficits: Secondary | ICD-10-CM | POA: Insufficient documentation

## 2016-06-09 DIAGNOSIS — E1142 Type 2 diabetes mellitus with diabetic polyneuropathy: Secondary | ICD-10-CM | POA: Insufficient documentation

## 2016-06-09 DIAGNOSIS — F319 Bipolar disorder, unspecified: Secondary | ICD-10-CM | POA: Insufficient documentation

## 2016-06-09 DIAGNOSIS — I10 Essential (primary) hypertension: Secondary | ICD-10-CM | POA: Insufficient documentation

## 2016-06-09 DIAGNOSIS — R270 Ataxia, unspecified: Secondary | ICD-10-CM | POA: Insufficient documentation

## 2016-06-09 DIAGNOSIS — K219 Gastro-esophageal reflux disease without esophagitis: Secondary | ICD-10-CM | POA: Insufficient documentation

## 2016-06-09 DIAGNOSIS — F1010 Alcohol abuse, uncomplicated: Secondary | ICD-10-CM | POA: Insufficient documentation

## 2016-06-09 DIAGNOSIS — Z888 Allergy status to other drugs, medicaments and biological substances status: Secondary | ICD-10-CM | POA: Insufficient documentation

## 2016-06-09 DIAGNOSIS — H499 Unspecified paralytic strabismus: Secondary | ICD-10-CM | POA: Insufficient documentation

## 2016-06-09 DIAGNOSIS — E11649 Type 2 diabetes mellitus with hypoglycemia without coma: Secondary | ICD-10-CM | POA: Diagnosis not present

## 2016-06-09 DIAGNOSIS — F1021 Alcohol dependence, in remission: Secondary | ICD-10-CM

## 2016-06-09 DIAGNOSIS — Z833 Family history of diabetes mellitus: Secondary | ICD-10-CM

## 2016-06-09 DIAGNOSIS — R410 Disorientation, unspecified: Secondary | ICD-10-CM | POA: Diagnosis present

## 2016-06-09 DIAGNOSIS — R4182 Altered mental status, unspecified: Secondary | ICD-10-CM | POA: Diagnosis not present

## 2016-06-09 DIAGNOSIS — F419 Anxiety disorder, unspecified: Secondary | ICD-10-CM | POA: Insufficient documentation

## 2016-06-09 DIAGNOSIS — M170 Bilateral primary osteoarthritis of knee: Secondary | ICD-10-CM | POA: Insufficient documentation

## 2016-06-09 LAB — RAPID URINE DRUG SCREEN, HOSP PERFORMED
Amphetamines: NOT DETECTED
BENZODIAZEPINES: POSITIVE — AB
Barbiturates: NOT DETECTED
Cocaine: NOT DETECTED
Opiates: NOT DETECTED
Tetrahydrocannabinol: NOT DETECTED

## 2016-06-09 LAB — COMPREHENSIVE METABOLIC PANEL
ALK PHOS: 108 U/L (ref 38–126)
ALT: 44 U/L (ref 17–63)
AST: 41 U/L (ref 15–41)
Albumin: 4 g/dL (ref 3.5–5.0)
Anion gap: 12 (ref 5–15)
BILIRUBIN TOTAL: 0.6 mg/dL (ref 0.3–1.2)
BUN: 14 mg/dL (ref 6–20)
CALCIUM: 9.7 mg/dL (ref 8.9–10.3)
CO2: 25 mmol/L (ref 22–32)
CREATININE: 0.77 mg/dL (ref 0.61–1.24)
Chloride: 101 mmol/L (ref 101–111)
Glucose, Bld: 225 mg/dL — ABNORMAL HIGH (ref 65–99)
Potassium: 4 mmol/L (ref 3.5–5.1)
Sodium: 138 mmol/L (ref 135–145)
TOTAL PROTEIN: 7.2 g/dL (ref 6.5–8.1)

## 2016-06-09 LAB — CBC WITH DIFFERENTIAL/PLATELET
Basophils Absolute: 0 10*3/uL (ref 0.0–0.1)
Basophils Relative: 0 %
EOS ABS: 0.2 10*3/uL (ref 0.0–0.7)
Eosinophils Relative: 4 %
HCT: 49.3 % (ref 39.0–52.0)
Hemoglobin: 17.6 g/dL — ABNORMAL HIGH (ref 13.0–17.0)
LYMPHS ABS: 1.7 10*3/uL (ref 0.7–4.0)
Lymphocytes Relative: 29 %
MCH: 33 pg (ref 26.0–34.0)
MCHC: 35.7 g/dL (ref 30.0–36.0)
MCV: 92.3 fL (ref 78.0–100.0)
MONO ABS: 0.7 10*3/uL (ref 0.1–1.0)
Monocytes Relative: 13 %
Neutro Abs: 3.1 10*3/uL (ref 1.7–7.7)
Neutrophils Relative %: 54 %
PLATELETS: ADEQUATE 10*3/uL (ref 150–400)
RBC: 5.34 MIL/uL (ref 4.22–5.81)
RDW: 14.3 % (ref 11.5–15.5)
WBC: 5.7 10*3/uL (ref 4.0–10.5)

## 2016-06-09 LAB — URINALYSIS, ROUTINE W REFLEX MICROSCOPIC
Bacteria, UA: NONE SEEN
Bilirubin Urine: NEGATIVE
HGB URINE DIPSTICK: NEGATIVE
KETONES UR: 5 mg/dL — AB
Leukocytes, UA: NEGATIVE
NITRITE: NEGATIVE
Protein, ur: NEGATIVE mg/dL
Specific Gravity, Urine: 1.016 (ref 1.005–1.030)
Squamous Epithelial / LPF: NONE SEEN
pH: 5 (ref 5.0–8.0)

## 2016-06-09 LAB — GLUCOSE, CAPILLARY
GLUCOSE-CAPILLARY: 198 mg/dL — AB (ref 65–99)
Glucose-Capillary: 205 mg/dL — ABNORMAL HIGH (ref 65–99)
Glucose-Capillary: 231 mg/dL — ABNORMAL HIGH (ref 65–99)

## 2016-06-09 LAB — AMMONIA: Ammonia: 24 umol/L (ref 9–35)

## 2016-06-09 LAB — MRSA PCR SCREENING: MRSA by PCR: NEGATIVE

## 2016-06-09 LAB — ETHANOL

## 2016-06-09 LAB — LIPASE, BLOOD: Lipase: 14 U/L (ref 11–51)

## 2016-06-09 MED ORDER — ARIPIPRAZOLE 5 MG PO TABS
5.0000 mg | ORAL_TABLET | Freq: Every day | ORAL | Status: DC
  Administered 2016-06-09 – 2016-06-13 (×5): 5 mg via ORAL
  Filled 2016-06-09 (×5): qty 1

## 2016-06-09 MED ORDER — CHLORDIAZEPOXIDE HCL 25 MG PO CAPS
25.0000 mg | ORAL_CAPSULE | Freq: Two times a day (BID) | ORAL | Status: DC
  Administered 2016-06-09 – 2016-06-11 (×4): 25 mg via ORAL
  Filled 2016-06-09 (×4): qty 1

## 2016-06-09 MED ORDER — CHLORDIAZEPOXIDE HCL 25 MG PO CAPS
25.0000 mg | ORAL_CAPSULE | Freq: Two times a day (BID) | ORAL | Status: DC
  Administered 2016-06-09: 25 mg via ORAL
  Filled 2016-06-09: qty 1

## 2016-06-09 MED ORDER — DULOXETINE HCL 60 MG PO CPEP
60.0000 mg | ORAL_CAPSULE | Freq: Every day | ORAL | Status: DC
  Administered 2016-06-09 – 2016-06-13 (×5): 60 mg via ORAL
  Filled 2016-06-09 (×5): qty 1

## 2016-06-09 MED ORDER — ENOXAPARIN SODIUM 40 MG/0.4ML ~~LOC~~ SOLN
40.0000 mg | SUBCUTANEOUS | Status: DC
  Administered 2016-06-09 – 2016-06-13 (×5): 40 mg via SUBCUTANEOUS
  Filled 2016-06-09 (×5): qty 0.4

## 2016-06-09 MED ORDER — INSULIN ASPART 100 UNIT/ML ~~LOC~~ SOLN
0.0000 [IU] | Freq: Every day | SUBCUTANEOUS | Status: DC
  Administered 2016-06-10: 4 [IU] via SUBCUTANEOUS
  Administered 2016-06-11: 3 [IU] via SUBCUTANEOUS
  Administered 2016-06-12: 4 [IU] via SUBCUTANEOUS

## 2016-06-09 MED ORDER — INSULIN DETEMIR 100 UNIT/ML ~~LOC~~ SOLN: 5.0000 [IU] | Freq: Every day | SUBCUTANEOUS | Status: DC

## 2016-06-09 MED ORDER — INSULIN ASPART 100 UNIT/ML ~~LOC~~ SOLN
0.0000 [IU] | Freq: Three times a day (TID) | SUBCUTANEOUS | Status: DC
  Administered 2016-06-09 (×2): 3 [IU] via SUBCUTANEOUS
  Administered 2016-06-10: 7 [IU] via SUBCUTANEOUS
  Administered 2016-06-11: 3 [IU] via SUBCUTANEOUS
  Administered 2016-06-11: 2 [IU] via SUBCUTANEOUS
  Administered 2016-06-11: 9 [IU] via SUBCUTANEOUS
  Administered 2016-06-12: 1 [IU] via SUBCUTANEOUS
  Administered 2016-06-12: 2 [IU] via SUBCUTANEOUS
  Administered 2016-06-12 – 2016-06-13 (×2): 7 [IU] via SUBCUTANEOUS
  Administered 2016-06-13: 2 [IU] via SUBCUTANEOUS

## 2016-06-09 MED ORDER — AMITRIPTYLINE HCL 50 MG PO TABS
50.0000 mg | ORAL_TABLET | Freq: Every day | ORAL | Status: DC
  Administered 2016-06-09 – 2016-06-12 (×4): 50 mg via ORAL
  Filled 2016-06-09 (×4): qty 1

## 2016-06-09 MED ORDER — SODIUM CHLORIDE 0.9 % IV BOLUS (SEPSIS)
500.0000 mL | Freq: Once | INTRAVENOUS | Status: AC
  Administered 2016-06-09: 500 mL via INTRAVENOUS

## 2016-06-09 MED ORDER — PANCRELIPASE (LIP-PROT-AMYL) 12000-38000 UNITS PO CPEP
36000.0000 [IU] | ORAL_CAPSULE | Freq: Three times a day (TID) | ORAL | Status: DC
  Administered 2016-06-09 – 2016-06-13 (×13): 36000 [IU] via ORAL
  Filled 2016-06-09 (×13): qty 3

## 2016-06-09 MED ORDER — CHLORDIAZEPOXIDE HCL 25 MG PO CAPS: 25.0000 mg | ORAL_CAPSULE | Freq: Three times a day (TID) | ORAL | Status: DC

## 2016-06-09 NOTE — ED Notes (Signed)
Report given to [facility] RN. [facility] called for patient transport, states transport will take place after shift change

## 2016-06-09 NOTE — ED Notes (Signed)
Attempted to get blood, but was unsuccessful. 

## 2016-06-09 NOTE — Progress Notes (Signed)
Pt sitting up in recliner chair, offered pt several times the opportunity to get into bed but refused each time, stating he's been in bed all day. Chair alarm in use while in chair. Pt reminded to call for assistance to get up. Pt restless and continues to ask for a cigarette and beer.

## 2016-06-09 NOTE — H&P (Signed)
Date: 06/09/2016               Patient Name:  Michael [name] MRN: [id number]  DOB: [birth year] Age / Sex: 61 y.o., male   PCP: Collier Salina, MD         Medical Service: Internal Medicine Teaching Service         Attending Physician: Dr. Aldine Contes, MD    First Contact: Dr. Danford Bad Pager: [phone]  Second Contact: Dr. Benjamine Mola Pager: [phone]       After Hours (After 5p/  First Contact Pager: [phone]  weekends / holidays): Second Contact Pager: [phone]   Chief Complaint: Altered mental status   History of Present Illness: Mr. Legler is a 61yo man with PMHx of bipolar disorder, chronic alcoholic pancreatitis, schizophrenia, and type 2 diabetes who presents today with altered mental status. Patient is lethargic on exam and unable to give a history. History was obtained from chart. Patient was just discharged from the hospital on 5/17 after being admitted for alcohol withdrawal. Prior to this hospitalization, he was actually hospitalized at [facility] on 5/9 for alcohol intoxication and suicidal and homicidal ideations. Per nursing notes at [facility] yesterday, patient was found to be having visual hallucinations as he was trying to eat a nonexistent piece of fried chicken and becoming agitated with staff. He became hypoglycemic to 61 at one point and was given gatorade which improved his blood sugar to 72. In the ED, patient had told the provider that he was having hallucinations of someone standing in the corner which seems to be a recurrent hallucination for him. Patient had also reported epigastric and LUQ abdominal pain that radiated to his back. Abdominal pain reported to be associated with nausea and loose stools. He was given Ativan 2 mg in the ED due to his CIWA being 14.   Meds:  Amitriptyline 50 mg QHS Aripiprazole 5 mg daily Librium taper: 25 mg BID for 2 days, 25 mg   Allergies: Allergies as of 06/09/2016 - Review Complete 06/08/2016  Allergen Reaction Noted  . Ace  inhibitors Swelling 02/02/2011  . Losartan Swelling 02/03/2011  . Tylenol [acetaminophen] Other (See Comments) 04/09/2016   Past Medical History:  Diagnosis Date  . Anxiety   . Arthritis    "joints; shoulders; feet" (06/08/2016)  . Bipolar disorder ([place])   . Daily headache    "7:30 - 8:00 q night" (06/08/2016)  . Diabetic peripheral neuropathy ([place])   . GERD (gastroesophageal reflux disease)   . Pancreatitis   . Schizophrenia ([place])   . Seizures ([place]) 01/2016 X 2  . Type II diabetes mellitus (HCC)     Family History: Sister- HTN. Sister- DM. Sister- Heart disease.  Social History: Current smoker- 0.4 ppd x 40 years. Drinks 10 alcoholic drinks per week.   Review of Systems: A complete ROS was negative except as per HPI.   Physical Exam: Vitals: Temp 97.8, HR 98, RR 16, BP 109/88, SpO2 96%  General: Well-nourished man resting in bed, lethargic due to sedation, will open eyes to voice  HEENT: EOMI, sclera anicteric, mucus membranes moist CV: RRR, no m/g/r Pulm: CTA bilaterally, breaths non-labored Abd: BS+, soft, non-tender, non-distended Ext: warm, no peripheral edema Neuro: Lethargic, will open eyes to voice but quickly falls back asleep. Does not follow commands.   EKG: Sinus tachycardia. No ischemic changes.   CXR 5/17: Negative for acute abnormalities  CT head 5/17: Negative for acute abnormalities. Moderate small vessel ischemic changes.  Assessment & Plan by Problem:  Altered Mental Status: Patient presenting from behavioral health hospital after found to be having hallucinations and agitation. He was just discharged yesterday after being hospitalized for alcohol withdrawal. He is clearly out of the window for alcohol withdrawal at this point and on a Librium taper. He is having hallucinations most likely due to his psychiatric illness, including bipolar disorder and schizophrenia. Extensive work up thus far has been unrevealing for a medical cause for his hallucinations.  He was lethargic on my exam due to him receiving Ativan in the ED. Once he is able to wake up more and be more interactive he should be stable to return to [facility].  - Continue Librium taper, 25 mg BID for 2 days then 25 mg daily for 2 days - Continue psych meds as below - Continue to monitor to return to baseline mental status  Bipolar Disorder/Schizophenia: Having hallucinations likely due to his underlying psychiatric illness. - Continue Amitriptyline, Aripiprazole, and Cymbalta  Type 2 DM: He had a hypoglycemic episode while at [facility]. Will hold off on his nighttime Levemir as he is not awake enough to eat yet.  - Hold Levemir - Start diet when able - Sliding scale   Diet: Heart healthy DVT Ppx: Lovenox SQ Dispo: Admit patient to Observation with expected length of stay less than 2 midnights.  Signed: Juliet Rude, MD 06/09/2016, 9:30 AM  Pager: [phone]

## 2016-06-09 NOTE — ED Notes (Signed)
Attempted to get blood from pt, but pt refused.

## 2016-06-09 NOTE — Discharge Summary (Signed)
Name: RAYDEL HOSICK MRN: [id number] DOB: [birth year] 61 y.o. PCP: Collier Salina, MD  Date of Admission: 06/09/2016  8:54 AM Date of Discharge: 06/13/2016 Attending Physician: Aldine Contes, MD  Discharge Diagnosis: 1. Altered Mental Status, Alcohol-induced Neurocognitive disorder 2. Alcohol use disorder 3. Bipolar Disorder, Schizophrenia  Active Problems:   Altered mental status   Wernicke's encephalopathy   Alcohol abuse  Discharge Medications: Allergies as of 06/13/2016      Reactions   Ace Inhibitors Swelling, Other (See Comments)   Angioedema - unquestionable   Losartan Swelling, Other (See Comments)   Pt presented with unquestionable angioedema on ACEIs (Angiotensin-converting enzyme (ACE) inhibitors) so aviod ARBs (Angiotensin receptor blockers), as well   Tylenol [acetaminophen] Other (See Comments)   "cannot have this" (contraindicated)      Medication List    STOP taking these medications   chlordiazePOXIDE 25 MG capsule Commonly known as:  LIBRIUM   diclofenac sodium 1 % Gel Commonly known as:  VOLTAREN     TAKE these medications   amitriptyline 50 MG tablet Commonly known as:  ELAVIL Take 50 mg by mouth at bedtime.   ARIPiprazole 5 MG tablet Commonly known as:  ABILIFY Take 5 mg by mouth daily.   DULoxetine 60 MG capsule Commonly known as:  CYMBALTA TAKE 1 CAPSULE EVERY DAY   gabapentin 300 MG capsule Commonly known as:  NEURONTIN TAKE 3 CAPSULES BY MOUTH  EVERY MORNING AND  AFTERNOON. TAKE 4 CAPSULES  BY MOUTH AT NIGHT BEFORE  BED   hydrOXYzine 10 MG tablet Commonly known as:  ATARAX/VISTARIL Take 1 tablet (10 mg total) by mouth 3 (three) times daily as needed for itching. What changed:  reasons to take this   ibuprofen 800 MG tablet Commonly known as:  ADVIL,MOTRIN Take 800 mg by mouth 3 (three) times daily as needed.   insulin aspart 100 UNIT/ML injection Commonly known as:  NOVOLOG Inject 0-9 Units into the skin 3 (three)  times daily before meals.   lipase/protease/amylase 36000 UNITS Cpep capsule Commonly known as:  CREON TAKE 1 CAPSULE BY MOUTH 3  TIMES DAILY BEFORE MEALS   lovastatin 40 MG tablet Commonly known as:  MEVACOR Take 1 tablet (40 mg total) by mouth at bedtime.   pantoprazole 40 MG tablet Commonly known as:  PROTONIX Take 1 tablet (40 mg total) by mouth daily.   thiamine 100 MG tablet Commonly known as:  VITAMIN B-1 Take 1 tablet (100 mg total) by mouth daily.      Disposition and follow-up:   Mr.Senai L Hacker was discharged from [facility] in stable condition.  At the hospital follow up visit please address:  1.  Altered mental status, schizophrenia, bipolar disorder: Inquire about patients mental status and how he is doing at home. Please confirm again that he lives with his sister and niece and that they are able to provide him with 24-hr supervision and assistance. He will need follow-up with his psychiatrist.   Alcohol abuse: Please encourage Mr. Gombos to abstain from alcohol entirely.   Alcohol-related neurocognitive disorder: PLEASE ENSURE MR Dauzat IS TAKING DAILY THIAMINE.   2.  Labs / imaging needed at time of follow-up: HbA1c  3.  Pending labs/ test needing follow-up: none  Follow-up Appointments: Ignacia Marvel, MD - Friday 06/16/16 @ 2:30 pm.  Hospital Course by problem list: Active Problems:   Altered mental status   1. Altered Mental Status, Bipolar Disorder, Schizophrenia 61 year old man with chronic alcohol abuse,  bipolar disorder and schizophrenia initially admitted to [facility] from [facility] 06/05/16 - 06/08/16 for medical management of alcohol withdrawal. Last drink 05/31/16. He was treated with ativan and librium with resolution of delirium and required no ativan 36 hrs prior to discharge back to [facility]. At time of transfer back to [facility], he became agitated that he was unable to refuse this transfer. Upon arrival he remained agitated and tried again to refuse  admission. Staff also observed Mr. Gartin having visual and auditory hallucinations as well as intermittent lethargy. He was subsequently transferred back to [facility] for evaluation of AMS. CT head was without acute abnormality and he was afebrile and without leukocytosis and no clear indication of infection. On admission he was noted to have waxing and waning mental status and orientation. In addition, he was found to have ophthalmoplegia and wide-based gait. MRI did not show any acute intracranial process but did appreciate general cerebral atrophy. He was subsequently treated with high-dose IV thiamine however he intermittently refused IV medications so he received either PO or IM thiamine. He was seen by psychiatry who noted his absence of SI/HI and did not feel he met criteria for inpatient psychiatric admission. They also felt his presentation was consistent with alcohol-related neurocognitive disorder. Mr. Glogowski was seen by physical therapy who recommended 24-hr supervision/assistance and patient refused SNF placement. He lives at home with his sister and niece who were contacted prior to discharge to ensure patient would have adequate supervision at home. He had close follow-up scheduled with his primary care physician within the next several days after admission.   2. Type 2 Diabetes Mr. Schoenberg had a hypoglycemic episode on arrival to the ED after being re-started on reduced dose of basal insulin (5 units) at his last discharge. All long-acting insulin was held and his blood sugars were instead controlled with sliding scale insulin. He was discharged home with this regimen with close follow-up with his primary care physician.   Discharge Vitals:   BP 108/82 (BP Location: Left Arm)   Pulse 96   Temp 97.5 F (36.4 C) (Oral)   Resp (!) 23   SpO2 100%   Pertinent Labs, Studies, and Procedures:  CT head: No acute intracranial abnormality. Atrophy with small vessel ischemic changes. Unchanged from  prior CT 3 days prior. MRI 5/21: No acute process. No acute ischemia. Global cerebral atrophy.   CXR: No active cardiopulmonary disease. UA without infection Ethanol <5 UDS appropriately positive for benzodiazepines CBC without leukocytosis anemia CMET within normal limits  Discharge Instructions: Mr. Sacra, you were admitted due to altered mental status. We believe your altered status was due to effects of chronic alcohol use. This is associated with a vitamin deficiency called Thiamine. You have been discharged home with this medication which you will take once daily. In addition, there were changes to your insulin regimen. Please do not take your long-acting insulin. Please continue to take your Novolog.   SignedEinar Gip, DO 06/09/2016, 1:53 PM   Pager: [phone]

## 2016-06-09 NOTE — Progress Notes (Signed)
INTERVAL PROGRESS NOTE:  Re-evaluated Michael [name] at approximately 1 pm. He was in no acute distress and was actually oriented x6.  He was expressing frustration over being unable to leave the hospital or behavioral health despite the absence of suicidal or homicidal ideations. I asked Michael [name] if he recollected the events that brought him here and he states he was angry and frustrated that he was being "held against his will" at [facility].  It was explained to Michael [name] that he is admitted to a psychiatric hospital for suicidal and homicidal ideations and that we cannot release him from the hospital unless he is first released by psychiatry. He expressed understanding. I also emphasized the importance of Michael [name] remaining calm and collected to facilitate this process and that any agitation or aggression will (incorrectly) be assumed to be due to alcohol withdrawal. He again voiced understanding.    Assessment and Plan: This is a 60 y/o M with bipolar disorder, schizophrenia and alcohol abuse who was re-admitted at [facility] after being agitated and combative upon return to his psychiatric hospital. He was initially admitted to 5/9 to [facility] with alcohol intoxication and suicidal/homicidal ideations. He was transferred to [facility] 5/14 for management of alcohol withdrawal. His last drink was 5/9 and was still intoxicated at time of presentation. He is outside of the window for DT. He expresses agitation and frustration over not being allowed to go home.  -Discussed with [facility], working on re-obtaining Michael [name] a bed -Consulted psychiatry for their advice. Could we possibly discharge Michael [name] home from [facility] if cleared?  -No CIWA. Continue Librium taper.  -Please redirect patient as much as possible. He responded well to rational discussion and reminding of appropriate behavior.

## 2016-06-10 ENCOUNTER — Encounter (HOSPITAL_COMMUNITY): Payer: Self-pay

## 2016-06-10 DIAGNOSIS — F419 Anxiety disorder, unspecified: Secondary | ICD-10-CM | POA: Diagnosis not present

## 2016-06-10 DIAGNOSIS — E512 Wernicke's encephalopathy: Secondary | ICD-10-CM | POA: Diagnosis not present

## 2016-06-10 DIAGNOSIS — F1010 Alcohol abuse, uncomplicated: Secondary | ICD-10-CM | POA: Diagnosis not present

## 2016-06-10 DIAGNOSIS — F319 Bipolar disorder, unspecified: Secondary | ICD-10-CM | POA: Diagnosis not present

## 2016-06-10 DIAGNOSIS — K219 Gastro-esophageal reflux disease without esophagitis: Secondary | ICD-10-CM | POA: Diagnosis not present

## 2016-06-10 DIAGNOSIS — E1142 Type 2 diabetes mellitus with diabetic polyneuropathy: Secondary | ICD-10-CM | POA: Diagnosis not present

## 2016-06-10 DIAGNOSIS — K860 Alcohol-induced chronic pancreatitis: Secondary | ICD-10-CM | POA: Diagnosis not present

## 2016-06-10 DIAGNOSIS — F209 Schizophrenia, unspecified: Secondary | ICD-10-CM | POA: Diagnosis not present

## 2016-06-10 DIAGNOSIS — F17210 Nicotine dependence, cigarettes, uncomplicated: Secondary | ICD-10-CM | POA: Diagnosis not present

## 2016-06-10 LAB — GLUCOSE, CAPILLARY
GLUCOSE-CAPILLARY: 334 mg/dL — AB (ref 65–99)
GLUCOSE-CAPILLARY: 319 mg/dL — AB (ref 65–99)
Glucose-Capillary: 261 mg/dL — ABNORMAL HIGH (ref 65–99)
Glucose-Capillary: 233 mg/dL — ABNORMAL HIGH (ref 65–99)

## 2016-06-10 MED ORDER — VITAMIN B-1 100 MG PO TABS
500.0000 mg | ORAL_TABLET | Freq: Three times a day (TID) | ORAL | Status: DC
  Administered 2016-06-10 – 2016-06-11 (×3): 500 mg via ORAL
  Filled 2016-06-10 (×3): qty 5

## 2016-06-10 MED ORDER — NICOTINE 7 MG/24HR TD PT24
7.0000 mg | MEDICATED_PATCH | Freq: Every day | TRANSDERMAL | Status: DC
  Administered 2016-06-10 – 2016-06-13 (×4): 7 mg via TRANSDERMAL
  Filled 2016-06-10 (×4): qty 1

## 2016-06-10 MED ORDER — THIAMINE HCL 100 MG/ML IJ SOLN
500.0000 mg | Freq: Three times a day (TID) | INTRAVENOUS | Status: DC
  Filled 2016-06-10 (×2): qty 5

## 2016-06-10 NOTE — Progress Notes (Signed)
Medicine attending: Clinical status reviewed with resident physician Dr. Einar Gip and I concur with her evaluation and management plan. 61 year old man with known behavioral health issues variably recorded as bipolar disorder and schizophrenia who also suffers from chronic alcohol abuse.  He was recently admitted here and treated for alcohol withdrawal.  Discharged to behavioral health.  Readmitted to medical service at their request when he developed agitation and hallucinations within 24 hours of admission there. Our team did not feel that he was in alcohol withdrawal since he was monitored here in the hospital and was off alcohol for over a week then directly transferred to behavioral health.  Neurologic exam today by Dr. Danford Bad shows ophthalmoplegia which together with ataxia, persistent encephalopathy, and confabulation, concerning for Wernicke's encephalopathy. We will administer thiamine.  Continue anxiolytics and antidepressants.  Consultation with psychiatry.

## 2016-06-10 NOTE — Progress Notes (Signed)
Pt becoming restless and agitated with hallucinations, multiple attempts to get up out of chair. Looking for "things" under chair. Redirection and request to not stand up is making him more agitated. Asking for cigarettes. On call paged and Dr. Hulen Luster responded and gave verbal order for Nicotine patch 7mg . Chair alarm still in use

## 2016-06-10 NOTE — Progress Notes (Signed)
Dr. Danford Bad now at bedside w/patient.  Patient attempts to stand without assist; chair alarm in patient's hand.  Pt has pulled out his only IV access and removed condom catheter.  Telemetry leads also removed.  Dr. Danford Bad agrees patient does not need to be on telemetry and instructs to remove.  Patient answered all orientation questions w/nurse less than five minutes prior to Dr. Rober Minion arrival - with her, RN observes patient answering orientation questions incorrectly.

## 2016-06-10 NOTE — Progress Notes (Signed)
   Subjective: [name] was seen and evaluated today. He had no complaints. He spoke tangentially and without clear thought content. He was not oriented except for to place [name]). He occasionally responded to external stimuli, looking in the corners of the room and making faces.   Objective:  Vital signs in last 24 hours: Vitals:   06/09/16 2013 06/10/16 0022 06/10/16 0352 06/10/16 0731  BP: (!) 134/93 131/88 103/83 (!) 125/93  Pulse: 92 (!) 102 (!) 109 (!) 114  Resp: 19 16 16 19   Temp: 97.7 F (36.5 C) 97.7 F (36.5 C) 97.5 F (36.4 C) 98.4 F (36.9 C)  TempSrc: Oral Oral Oral Oral  SpO2: 100% 99% 97%    General: Chronically-ill appearing AA male sitting in recliner. Confused and a little agitated. Occasionally responds to external stimuli. Confabulating stories. Wide-based gate.  HENT: PERRL. Lateral deviation of right eye occasionally. Would not allow for testing of EOM. No conjunctival injection, icterus or ptosis.  Cardiovascular: Regular rate and rhythm. No murmur or rub appreciated. Pulmonary: CTA BL, no wheezing, crackles or rhonchi appreciated. Unlabored breathing.  Abdomen: Soft, NT. No guarding or rigidity. +bowel sounds.  Skin: Warm, dry. No cyanosis.   Assessment/Plan:  Active Problems:   Altered mental status  Altered Mental Status, Bipolar Disorder, Schizophrenia in patient with history of Alcohol Abuse: ?Wernike encephalopathy in this patient with long-standing alcohol abuse. Patient with persistent encephalopathy as manifested by disorientation, confabulation and inattention (no infectious etiology apparent, no longer in alcohol withdrawal), ophthalmoplegia and unsteady gate. He is also with visual hallucinations occasionally, although this appears to be a chronic issue for the patient. Periventricular white matter hypodensities c/w small vessel ischemic change however could fit with DDx of WE.  -Psych to see patient, appreciate recommendations  -High-dose  thiamine. Patient unfortunately continues to remove his IV access. Will replace orally for now -Librium 25 mg BID, Amitriptyline 50 mg QHS, Aripiprazole 5 mg and duloxetine 60 mg.  -Please attempt re-direction in this patient, he usually responds well to this.   Brittle Type 2 Diabetes, Chronic Pancreatic Insufficiency: With history of hypoglycemic seizures. Patient had hypoglycemia on arrival after receiving basal insulin and received IV glucose. This has been discontinued.  -Carb mod diet -Sliding scale ONLY -Creon  Dispo: Anticipated discharge unclear.   Diala Waxman, DO 06/10/2016, 8:04 AM Pager: [phone]

## 2016-06-10 NOTE — Progress Notes (Signed)
Pt discontinued his only IV access.  Refuses to allow RN to replace IV at this time.

## 2016-06-11 DIAGNOSIS — K8689 Other specified diseases of pancreas: Secondary | ICD-10-CM

## 2016-06-11 DIAGNOSIS — F319 Bipolar disorder, unspecified: Secondary | ICD-10-CM

## 2016-06-11 DIAGNOSIS — E1139 Type 2 diabetes mellitus with other diabetic ophthalmic complication: Secondary | ICD-10-CM | POA: Diagnosis not present

## 2016-06-11 DIAGNOSIS — K860 Alcohol-induced chronic pancreatitis: Secondary | ICD-10-CM | POA: Diagnosis not present

## 2016-06-11 DIAGNOSIS — F209 Schizophrenia, unspecified: Secondary | ICD-10-CM | POA: Diagnosis not present

## 2016-06-11 DIAGNOSIS — Z794 Long term (current) use of insulin: Secondary | ICD-10-CM

## 2016-06-11 DIAGNOSIS — E1142 Type 2 diabetes mellitus with diabetic polyneuropathy: Secondary | ICD-10-CM | POA: Diagnosis not present

## 2016-06-11 DIAGNOSIS — H499 Unspecified paralytic strabismus: Secondary | ICD-10-CM

## 2016-06-11 DIAGNOSIS — F17210 Nicotine dependence, cigarettes, uncomplicated: Secondary | ICD-10-CM | POA: Diagnosis not present

## 2016-06-11 DIAGNOSIS — F1010 Alcohol abuse, uncomplicated: Secondary | ICD-10-CM

## 2016-06-11 DIAGNOSIS — E512 Wernicke's encephalopathy: Secondary | ICD-10-CM

## 2016-06-11 DIAGNOSIS — R4182 Altered mental status, unspecified: Secondary | ICD-10-CM | POA: Diagnosis not present

## 2016-06-11 DIAGNOSIS — K219 Gastro-esophageal reflux disease without esophagitis: Secondary | ICD-10-CM | POA: Diagnosis not present

## 2016-06-11 DIAGNOSIS — F419 Anxiety disorder, unspecified: Secondary | ICD-10-CM | POA: Diagnosis not present

## 2016-06-11 LAB — CBC WITH DIFFERENTIAL/PLATELET
BASOS ABS: 0 10*3/uL (ref 0.0–0.1)
Basophils Relative: 0 %
EOS PCT: 5 %
Eosinophils Absolute: 0.2 10*3/uL (ref 0.0–0.7)
HCT: 38.8 % — ABNORMAL LOW (ref 39.0–52.0)
Hemoglobin: 13.3 g/dL (ref 13.0–17.0)
Lymphocytes Relative: 44 %
Lymphs Abs: 2.4 10*3/uL (ref 0.7–4.0)
MCH: 31.5 pg (ref 26.0–34.0)
MCHC: 34.3 g/dL (ref 30.0–36.0)
MCV: 91.9 fL (ref 78.0–100.0)
MONO ABS: 0.8 10*3/uL (ref 0.1–1.0)
Monocytes Relative: 15 %
Neutro Abs: 1.9 10*3/uL (ref 1.7–7.7)
Neutrophils Relative %: 36 %
PLATELETS: 172 10*3/uL (ref 150–400)
RBC: 4.22 MIL/uL (ref 4.22–5.81)
RDW: 14.1 % (ref 11.5–15.5)
WBC: 5.3 10*3/uL (ref 4.0–10.5)

## 2016-06-11 LAB — COMPREHENSIVE METABOLIC PANEL
ALBUMIN: 3.1 g/dL — AB (ref 3.5–5.0)
ALK PHOS: 77 U/L (ref 38–126)
ALT: 30 U/L (ref 17–63)
AST: 24 U/L (ref 15–41)
Anion gap: 6 (ref 5–15)
BUN: 8 mg/dL (ref 6–20)
CALCIUM: 8.8 mg/dL — AB (ref 8.9–10.3)
CO2: 26 mmol/L (ref 22–32)
Chloride: 106 mmol/L (ref 101–111)
Creatinine, Ser: 0.71 mg/dL (ref 0.61–1.24)
GFR calc Af Amer: >60 mL/min (ref >60)
GFR calc non Af Amer: >60 mL/min (ref >60)
Glucose, Bld: 212 mg/dL — ABNORMAL HIGH (ref 65–99)
Potassium: 3.4 mmol/L — ABNORMAL LOW (ref 3.5–5.1)
Sodium: 138 mmol/L (ref 135–145)
Total Bilirubin: 0.6 mg/dL (ref 0.3–1.2)
Total Protein: 5.4 g/dL — ABNORMAL LOW (ref 6.5–8.1)

## 2016-06-11 LAB — GLUCOSE, CAPILLARY
GLUCOSE-CAPILLARY: 194 mg/dL — AB (ref 65–99)
GLUCOSE-CAPILLARY: 353 mg/dL — AB (ref 65–99)
Glucose-Capillary: 222 mg/dL — ABNORMAL HIGH (ref 65–99)
Glucose-Capillary: 258 mg/dL — ABNORMAL HIGH (ref 65–99)

## 2016-06-11 MED ORDER — THIAMINE HCL 100 MG/ML IJ SOLN
500.0000 mg | Freq: Three times a day (TID) | INTRAVENOUS | Status: DC
  Administered 2016-06-11 (×3): 500 mg via INTRAVENOUS
  Filled 2016-06-11: qty 5
  Filled 2016-06-11: qty 4
  Filled 2016-06-11 (×3): qty 5

## 2016-06-11 MED ORDER — SODIUM CHLORIDE 0.9 % IV BOLUS (SEPSIS)
1000.0000 mL | Freq: Once | INTRAVENOUS | Status: AC
  Administered 2016-06-11: 1000 mL via INTRAVENOUS

## 2016-06-11 MED ORDER — SODIUM CHLORIDE 0.9 % IV SOLN: INTRAVENOUS | Status: AC

## 2016-06-11 NOTE — Care Management Obs Status (Signed)
[facility] NOTIFICATION   Patient Details  Name: Michael [name] MRN: [id number] Date of Birth: Jan 13, 1956   Medicare Observation Status Notification Given:  Yes    Dellie Catholic, RN 06/11/2016, 3:09 PM

## 2016-06-11 NOTE — Progress Notes (Signed)
INTERVAL PROGRESS NOTE:  Michael [name] was reassessed this afternoon at approximately 1:15pm. He was awake, alert, sitting upright in bed and eating breakfast without distress and without assistance. He was surprised to hear we had difficulty arousing him this morning but said he didn't get much sleep last night and he was sleeping hard. He has no complaints. Physical exam did show some ?lateral deviation of eyes however was otherwise similar to prior exams.   Unsure if somnolence this morning was over-sedation from medication, lack of sleep overnight or multifactorial. Regardless we will continue to hold his sedating medications and offer caution on future sedation. Michael [name] usually redirects well and would prefer redirection vs additional sedation.

## 2016-06-11 NOTE — Progress Notes (Addendum)
   Subjective: Mr. Lisenbee was seen and evaluated today at bedside. He withdrew minimally to painful stimuli. He did occasionally move all 4 extremities spontaneously. Nursing staff reports last saw Mr. Landess at [facility] and he was talkative and took meds without issue. He reportedly had an active night without much sleep last night however refused PM dose of thiamine.   Objective:  Vital signs in last 24 hours: Vitals:   06/10/16 1139 06/10/16 1346 06/10/16 2103 06/11/16 0551  BP: 126/89 (!) 143/88 123/72 102/63  Pulse: 96 (!) 104 (!) 103 94  Resp: 18 18 17 18   Temp: 97.6 F (36.4 C) 98 F (36.7 C) 97.5 F (36.4 C) 97.4 F (36.3 C)  TempSrc: Oral Oral    SpO2: 97% 97% 100% 100%  Weight:      Height:       General: Lethargic chronically-ill appearing african [place] male sleeping sideways in bed.  Neuro: Minimally withdrawals to painful stimuli. Briefly arouses however quickly falls asleep. Moves all 4 extremities spontaneously. HENT: Pupils small but ERRL. Left eye "down and out". ?Lateral deviation of right. Oropharynx clear, mucous membranes dry.  Cardiovascular: Regular rate and rhythm. No murmur or rub appreciated. Pulmonary: CTA BL on anterior chest. Breathing spontaneously and with normal rate and WOB. Saturating 100% on RA Abdomen: Soft, NTND. No rigidity.  Skin: Warm, dry. No cyanosis.   Assessment/Plan:  Active Problems:   Altered mental status  ?Wernike Encephalopathy, Altered Mental Status, Bipolar Disorder, Schizophrenia in patient with history of Alcohol Abuse: Progressive ophthalmoplegia and fluctuating mental status. Patient currently stuporous after taking AM medications. No med changes were made recently. He is not hypoglycemic and CBC/CMET are essentially wnl and without an explanation for pts current presentation. He is afebrile, with stable BP, O2 sats and pulse. He's had several head CTs lately, most recently 5/17 which showed no acute changes.  -Nursing to inform  if any changes in patients status -Hold ALL sedating medications -High dose thiamine - He refused PO thiamine overnight. Will now give IV. -Psych consulted, to see patient -High dose thiamine - He refused PO thiamine last night however will give IV now.  -IVF -Please attempt re-direction in this patient should he become agitated, he usually responds well to this.   Brittle Type 2 Diabetes, Chronic Pancreatic Insufficiency: With history of hypoglycemic seizures. Patient had hypoglycemia on arrival after receiving basal insulin and received IV glucose.  -Sliding scale ONLY -Creon  Dispo: Anticipated discharge unclear.   Rhyanna Sorce, DO 06/11/2016, 10:23 AM Pager: [phone]

## 2016-06-11 NOTE — Progress Notes (Signed)
Medicine attending: I personally examined this patient today and I concur with the evaluation and management plan as recorded by resident physician Dr. Romelle Starcher Molt. 61 year old man with advanced behavioral health issues and history of alcohol abuse readmitted here 24 hours after discharge to the behavioral health center after treatment for alcohol withdrawal when he remained agitated and continued to have hallucinations.  Mental status changes felt by our team to be more likely due to his schizophrenia although the findings of ophthalmoplegia, ataxia, and persistent encephalopathy suggest that he may have developed Wernicke's encephalopathy. He was put back on Librium 25 mg 3 times daily.  Attempts to give thiamine IV unsuccessful when patient removed his intravenous line and he was changed to oral thiamine. At time of morning rounds, patient was extremely lethargic.  Barely arousable.  Moving all extremity to painful stimuli.  Pupils equal round reactive to light.  He appeared to have a 3rd nerve palsy on the left side with the eye deviated down into the left. He just had a CT brain the other day which showed no hemorrhage or stroke.  Laboratory studies done yesterday also unremarkable. We stopped the Librium.  Replace the IV for fluids and medications. Exam later in the day by Dr. Danford Bad: The patient now awake and alert.  States he was just very tired.  We will continue to hold the anxiolytics at this time.  Still with ophthalmoplegia but not as pronounced with no "down and out" sign. We will continue to monitor his status.  We are waiting for input from psychiatry.  Impression: 1.  Alcohol withdrawal treated and resolved 2.  Hallucinosis complex Warnicke's encephalopathy versus underlying schizophrenia 3.  Oversedation

## 2016-06-12 ENCOUNTER — Observation Stay (HOSPITAL_COMMUNITY): Payer: Medicare HMO

## 2016-06-12 DIAGNOSIS — F1290 Cannabis use, unspecified, uncomplicated: Secondary | ICD-10-CM

## 2016-06-12 DIAGNOSIS — R404 Transient alteration of awareness: Secondary | ICD-10-CM

## 2016-06-12 DIAGNOSIS — F10239 Alcohol dependence with withdrawal, unspecified: Secondary | ICD-10-CM

## 2016-06-12 DIAGNOSIS — K860 Alcohol-induced chronic pancreatitis: Secondary | ICD-10-CM | POA: Diagnosis not present

## 2016-06-12 DIAGNOSIS — F17210 Nicotine dependence, cigarettes, uncomplicated: Secondary | ICD-10-CM | POA: Diagnosis not present

## 2016-06-12 DIAGNOSIS — R270 Ataxia, unspecified: Secondary | ICD-10-CM | POA: Diagnosis not present

## 2016-06-12 DIAGNOSIS — E512 Wernicke's encephalopathy: Secondary | ICD-10-CM

## 2016-06-12 DIAGNOSIS — E11649 Type 2 diabetes mellitus with hypoglycemia without coma: Secondary | ICD-10-CM | POA: Diagnosis not present

## 2016-06-12 DIAGNOSIS — H499 Unspecified paralytic strabismus: Secondary | ICD-10-CM | POA: Diagnosis not present

## 2016-06-12 DIAGNOSIS — K219 Gastro-esophageal reflux disease without esophagitis: Secondary | ICD-10-CM | POA: Diagnosis not present

## 2016-06-12 DIAGNOSIS — R410 Disorientation, unspecified: Secondary | ICD-10-CM | POA: Diagnosis not present

## 2016-06-12 DIAGNOSIS — F319 Bipolar disorder, unspecified: Secondary | ICD-10-CM | POA: Diagnosis not present

## 2016-06-12 DIAGNOSIS — H5123 Internuclear ophthalmoplegia, bilateral: Secondary | ICD-10-CM | POA: Diagnosis not present

## 2016-06-12 DIAGNOSIS — F1021 Alcohol dependence, in remission: Secondary | ICD-10-CM | POA: Diagnosis not present

## 2016-06-12 DIAGNOSIS — E1142 Type 2 diabetes mellitus with diabetic polyneuropathy: Secondary | ICD-10-CM | POA: Diagnosis not present

## 2016-06-12 DIAGNOSIS — K861 Other chronic pancreatitis: Secondary | ICD-10-CM | POA: Diagnosis not present

## 2016-06-12 DIAGNOSIS — F419 Anxiety disorder, unspecified: Secondary | ICD-10-CM | POA: Diagnosis not present

## 2016-06-12 DIAGNOSIS — R4182 Altered mental status, unspecified: Secondary | ICD-10-CM | POA: Diagnosis not present

## 2016-06-12 DIAGNOSIS — F209 Schizophrenia, unspecified: Secondary | ICD-10-CM | POA: Diagnosis not present

## 2016-06-12 DIAGNOSIS — F1010 Alcohol abuse, uncomplicated: Secondary | ICD-10-CM | POA: Diagnosis not present

## 2016-06-12 LAB — GLUCOSE, CAPILLARY
GLUCOSE-CAPILLARY: 124 mg/dL — AB (ref 65–99)
Glucose-Capillary: 314 mg/dL — ABNORMAL HIGH (ref 65–99)
Glucose-Capillary: 154 mg/dL — ABNORMAL HIGH (ref 65–99)
Glucose-Capillary: 319 mg/dL — ABNORMAL HIGH (ref 65–99)

## 2016-06-12 MED ORDER — THIAMINE HCL 100 MG/ML IJ SOLN
100.0000 mg | Freq: Three times a day (TID) | INTRAMUSCULAR | Status: DC
  Administered 2016-06-12 – 2016-06-13 (×5): 100 mg via INTRAMUSCULAR
  Filled 2016-06-12 (×5): qty 2

## 2016-06-12 NOTE — Progress Notes (Signed)
   Subjective: [name] was seen and evaluated today at bedside. He was eating breakfast in recliner without issue. Has no complaints.   Objective:  Vital signs in last 24 hours: Vitals:   06/11/16 0551 06/11/16 1348 06/11/16 2106 06/12/16 0544  BP: 102/63 128/81 (!) 127/94 133/88  Pulse: 94 (!) 106 99 87  Resp: 18 16  18   Temp: 97.4 F (36.3 C) 97.5 F (36.4 C) 97.6 F (36.4 C) 97.5 F (36.4 C)  TempSrc:   Oral Oral  SpO2: 100% 100% 95% 100%  Weight:      Height:       General: Chronically-ill appearing AA male resting comfortably in recliner without distress.  Neuro: Oriented x 6 (place, address, year, month, age, birthday) however would intermittently become confused and drift off during conversation.  HENT: Lateral deviation of left eye. EOM appear intact however did not follow direction well during exam. PERRL. Cardiovascular: Regular rate and rhythm. No murmur or rub appreciated. Pulmonary: CTA BL. Normal WOB on RA. Abdomen: Soft, NTND. No rigidity.  Skin: Warm, dry. No cyanosis.   Assessment/Plan:  Active Problems:   Altered mental status   Wernicke's encephalopathy   Alcohol abuse  ?Wernike Encephalopathy, Altered Mental Status, Bipolar Disorder, Schizophrenia in patient with history of Alcohol Abuse: This ophthalmoplegia, ataxia and intermittent lethargy appears NEW from other admissions. Lateral deviation of left eye still appreciated. Patient with fluctuating mental status. Oriented however will fall asleep during conversation and he will become intermittently lethargic throughout the day despite holding all sedating medications. Labs yesterday without explanation and sedating medications have been held with persistent intermittent lethargy. Patient would benefit from MRI to ensure no new stroke, intracranial changes or even signs that would suggest WE.  -IM Thiamine, patient continues to pull out IV.  -MRI brain without contrast -Continue to hold sedating  medications, although could do 1x dose of Ativan if needed for MRI to get adequate imaging -Seen by PT who rec 24 hr assistance/supervision. Unclear if pt will return back to [facility]? They will apparently not take him back if he is unable to ambulate on his own -Have again consulted psychiatry who have not yet seen patient  Brittle Type 2 Diabetes, Chronic Pancreatic Insufficiency:  Hx of hypoglycemic seizures.  -Sliding scale ONLY -Creon  Dispo: Anticipated discharge unclear. Working-up new neurological deficits and placement is definitely an issue presently. Would appreciate psych.  Ambriana Selway, DO 06/12/2016, 1:18 PM Pager: [phone]

## 2016-06-12 NOTE — Evaluation (Signed)
Physical Therapy Evaluation Patient Details Name: PANTELIS ELGERSMA MRN: [id number] DOB: 1955-03-02 Today's Date: 06/12/2016   History of Present Illness  Pt is a 61 y/o male admitted from behavioral health hospital secondary to AMS and halucinations. Pt previously admitted secondary to suicidal ideations and intoxication. PMH includes bipolar disorder, alcoholism, chronic alcoholic pancreatitis, DM, and schizophrenia.   Clinical Impression  Pt admitted secondary to problem above with deficits below. Pt admitted from behavioral health hospital, and per RN, pt will be returning at d/c. Upon evaluation, pt very lethargic with garbled speech, therefore unable to get history or previous ambulation assist. Pt requiring mod-max A +2 to stand from chair and demonstrating heavy posterior lean. Notified RN and RN contacted MD who is aware. Reports pt will be going for MRI to see if cause for lethargy can be determined. Will continue to follow and progress mobility and update recommendations based on pt progress.     Follow Up Recommendations Other (comment);Supervision/Assistance - 24 hour (return to [facility])    Equipment Recommendations  Other (comment) (TBD based on pt progress)    Recommendations for Other Services       Precautions / Restrictions Precautions Precautions: Fall Restrictions Weight Bearing Restrictions: No      Mobility  Bed Mobility               General bed mobility comments: In chair upon entry   Transfers Overall transfer level: Needs assistance Equipment used: 2 person hand held assist Transfers: Sit to/from Stand Sit to Stand: Mod assist;Max assist;+2 physical assistance         General transfer comment: Performed sit<>Stand transfer X 2 with 1 person HHA and X1 with 2 person HHA and RW. Pt unable to use RW secondary to lethargy and improper use. Pt with heavy posterior lean and required assist to prevent fall.   Ambulation/Gait             General Gait  Details: Not attempted secondary to lethargy.   Stairs            Wheelchair Mobility    Modified Rankin (Stroke Patients Only)       Balance Overall balance assessment: Needs assistance Sitting-balance support: Bilateral upper extremity supported;Feet supported Sitting balance-Leahy Scale: Poor Sitting balance - Comments: mod-max A to maintain sitting balance in recliner Postural control: Posterior lean Standing balance support: Bilateral upper extremity supported;During functional activity Standing balance-Leahy Scale: Poor Standing balance comment: Mod-max A +2 and heavy posterior lean in standing                             Pertinent Vitals/Pain Pain Assessment: Faces Faces Pain Scale: No hurt    Home Living Family/patient expects to be discharged to:: Other (Comment) (return to [facility])                 Additional Comments: Unsure of pt pror living situation secondary to lethargy.     Prior Function           Comments: Pt very lethargic and speech garbled. Did say he didn't use AD to ambulate     Hand Dominance   Dominant Hand: Left    Extremity/Trunk Assessment   Upper Extremity Assessment Upper Extremity Assessment: Difficult to assess due to impaired cognition    Lower Extremity Assessment Lower Extremity Assessment: Difficult to assess due to impaired cognition (Able to perform LAQ, but difficulty secondary to lethargy)  Communication   Communication: Other (comment) (garbled speech )  Cognition Arousal/Alertness: Lethargic Behavior During Therapy: Flat affect Overall Cognitive Status: Difficult to assess                                        General Comments General comments (skin integrity, edema, etc.): Pt very lethargic throughout session. Notified RN who came into room. Checked vitals and WNL. RN spoke with MD who stated they were aware and would be performing MRI. Attempted seated ther ex, however,  pt falling asleep and unable to follow verbal cues.     Exercises     Assessment/Plan    PT Assessment Patient needs continued PT services  PT Problem List Decreased strength;Decreased range of motion;Decreased activity tolerance;Decreased balance;Decreased mobility;Decreased coordination;Decreased cognition;Decreased knowledge of use of DME;Decreased safety awareness;Decreased knowledge of precautions       PT Treatment Interventions DME instruction;Gait training;Functional mobility training;Therapeutic activities;Therapeutic exercise;Balance training;Neuromuscular re-education;Cognitive remediation;Patient/family education    PT Goals (Current goals can be found in the Care Plan section)  Acute Rehab PT Goals Patient Stated Goal: unable to state PT Goal Formulation: Patient unable to participate in goal setting Time For Goal Achievement: 06/26/16 Potential to Achieve Goals: Fair    Frequency Min 3X/week   Barriers to discharge   Unsure of caregiver support at home     Co-evaluation               AM-PAC PT "6 Clicks" Daily Activity  Outcome Measure Difficulty turning over in bed (including adjusting bedclothes, sheets and blankets)?: A Lot Difficulty moving from lying on back to sitting on the side of the bed? : Total Difficulty sitting down on and standing up from a chair with arms (e.g., wheelchair, bedside commode, etc,.)?: Total Help needed moving to and from a bed to chair (including a wheelchair)?: Total Help needed walking in hospital room?: Total Help needed climbing 3-5 steps with a railing? : Total 6 Click Score: 7    End of Session Equipment Utilized During Treatment: Gait belt Activity Tolerance: Patient limited by lethargy Patient left: in chair;with call bell/phone within reach;with chair alarm set;with nursing/sitter in room Nurse Communication: Mobility status PT Visit Diagnosis: Other abnormalities of gait and mobility (R26.89);Unsteadiness on feet  (R26.81)    Time: 0630-1601 PT Time Calculation (min) (ACUTE ONLY): 35 min   Charges:   PT Evaluation $PT Eval Moderate Complexity: 1 Procedure PT Treatments $Therapeutic Activity: 8-22 mins   PT G Codes:   PT G-Codes **NOT FOR INPATIENT CLASS** Functional Assessment Tool Used: AM-PAC 6 Clicks Basic Mobility;Clinical judgement Functional Limitation: Mobility: Walking and moving around Mobility: Walking and Moving Around Current Status (U9323): At least 80 percent but less than 100 percent impaired, limited or restricted Mobility: Walking and Moving Around Goal Status [phone]): At least 1 percent but less than 20 percent impaired, limited or restricted    Nicky Pugh, PT, DPT  Acute Rehabilitation Services  Pager: [place] 06/12/2016, 12:34 PM

## 2016-06-12 NOTE — Progress Notes (Signed)
Pt has removed his IV and when RN informed pt the need to insert another pt stated "No you won't put another in me, not tonight you won't". Dr. Wynetta Emery made aware. Will continue to monitor pt.

## 2016-06-12 NOTE — Progress Notes (Signed)
CSW spoke with [facility] today regarding patient. They stated that patient arrived to them on the 17th smelling of urine and that the patient could not ambulate. Patient must be able to ambulate in order to discharge to [facility] as their treatment plan involves being able to move around the hallways. CSW continuing to follow for discharge needs.   Percell Locus Neshawn Aird LCSWA [phone]

## 2016-06-12 NOTE — Progress Notes (Signed)
Inpatient Diabetes Program Recommendations  AACE/ADA: New Consensus Statement on Inpatient Glycemic Control (2015)  Target Ranges:  Prepandial:   less than 140 mg/dL      Peak postprandial:   less than 180 mg/dL (1-2 hours)      Critically ill patients:  140 - 180 mg/dL   Lab Results  Component Value Date   GLUCAP 124 (H) 06/12/2016   HGBA1C 11.5 01/14/2016    Review of Glycemic Control  Diabetes history: DM2 Outpatient Diabetes medications: Levemir 5 units QAM (not started), Novolog 0-9 units tidwc Current orders for Inpatient glycemic control: Novolog 0-9 units tidwc  Inpatient Diabetes Program Recommendations:    Check HgbA1C to assess glycemic control prior to admission. Last one 11.5% on 01/14/2016  Will continue to follow.  Thank you. Lorenda Peck, RD, LDN, CDE Inpatient Diabetes Coordinator [phone]

## 2016-06-12 NOTE — Consult Note (Signed)
[facility] Consult   Reason for Consult:  Alcohol dementia Referring Physician:  Dr. Dareen Piano Patient Identification: Michael [name] MRN:  [id number] Principal Diagnosis: <principal problem not specified> Diagnosis:   Patient Active Problem List   Diagnosis Date Noted  . Wernicke's encephalopathy [E51.2]   . Alcohol abuse [F10.10]   . Delirium [R41.0] 06/09/2016  . Altered mental status [R41.82] 06/09/2016  . Alcohol withdrawal ([place]) [F10.239] 06/05/2016  . Bipolar disorder ([place]) [F31.9] 06/01/2016  . Diabetes mellitus due to underlying condition with ketoacidosis without coma, with long-term current use of insulin ([place]) [E08.10, Z79.4] 02/04/2016  . Dry skin [L85.3] 01/18/2016  . Peripheral vascular disease ([place]) [I73.9] 11/15/2015  . Tobacco abuse [Z72.0] 09/02/2015  . Tricompartment osteoarthritis of both knees [M17.0] 06/15/2015  . Chronic abdominal pain [R10.9, G89.29] 03/14/2015  . Vitamin D deficiency [E55.9] 03/12/2015  . Hyperlipidemia [E78.5] 02/17/2015  . Fall [W19.XXXA] 12/24/2014  . Protein-calorie malnutrition, severe [E43] 11/18/2014  . Alcohol use disorder ([place]) [F10.99]   . Cervical arthritis ([place]) [M46.92] 01/01/2014  . Health care maintenance [Z00.00] 08/25/2011  . Chronic alcoholic pancreatitis ([place]) [K86.0] 04/20/2011  . Type 2 diabetes mellitus with peripheral neuropathy ([place]) [E11.42] 12/09/2010  . HTN (hypertension) [I10] 12/09/2010    Total Time spent with patient: 20 minutes  Subjective:   Michael [name] is a 61 y.o. male patient admitted with AMS in the setting of alcohol use disorder, and chronic untreated mental illness.  He was recently at [facility] for alcohol detox in early May 2018.  Psychiatry consulted for dispo and recommendations for confusion.  HPI:  Michael [name] spoke with this Probation officer briefly. He continue to be quite clearly confused to his circumstances.  He reports that he knows that he is at the hospital and knows the year.   He understands some of the events, but then will briefly speak for moments as if he is at his own home.  Says "I didn't drink today because I was going to see some family."    Reports that he is sleeping well at night. He agrees that he does seem to be more confused at night.  He reports that he feels like his appetite is good.  He reports that his mood is fine and happy. He denies any SI and reports "noooo, I want to be alive."  He denies any HI or desire to harm anyone.  He reports that he has family nearby in [place] and [place], and they are a good support.   Regarding alcohol use, he reports that "I used to slam them down, but I haven't done that in a while."  He reports that he has not used alcohol heavily in approximately 1 month.  (patient reports his appetite is good, but is sitting in front of a tray of unconsumed food)  Past Psychiatric History: history of [facility] hospitalization and alcohol detox  Risk to Self: Is patient at risk for suicide?: No Risk to Others:   Prior Inpatient Therapy:   Prior Outpatient Therapy:    Past Medical History:  Past Medical History:  Diagnosis Date  . Anxiety   . Arthritis    "joints; shoulders; feet" (06/08/2016)  . Bipolar disorder ([place])   . Daily headache    "7:30 - 8:00 q night" (06/08/2016)  . Diabetic peripheral neuropathy ([place])   . GERD (gastroesophageal reflux disease)   . Pancreatitis   . Schizophrenia ([place])   . Seizures ([place]) 01/2016 X 2  . Type II diabetes  mellitus [facility])     Past Surgical History:  Procedure Laterality Date  . BALLOON DILATION  03/22/2011   Procedure: BALLOON DILATION;  Surgeon: Gatha Mayer, MD;  Location: [facility];  Service: Endoscopy;  Laterality: N/A;  . COLONOSCOPY N/A 05/22/2012   Procedure: COLONOSCOPY;  Surgeon: Gatha Mayer, MD;  Location: [facility];  Service: Endoscopy;  Laterality: N/A;  . COLONOSCOPY W/ BIOPSIES AND POLYPECTOMY  09/12/11  . ESOPHAGOGASTRODUODENOSCOPY  03/22/2011   Procedure:  ESOPHAGOGASTRODUODENOSCOPY (EGD);  Surgeon: Gatha Mayer, MD;  Location: [facility];  Service: Endoscopy;  Laterality: N/A;  egd with balloon   . EUS  04/20/2011   Procedure: UPPER ENDOSCOPIC ULTRASOUND (EUS) LINEAR;  Surgeon: Milus Banister, MD;  Location: [facility];  Service: Endoscopy;  Laterality: N/A;  . KNEE ARTHROSCOPY Left    Family History:  Family History  Problem Relation Age of Onset  . Hypertension Sister   . Diabetes Sister   . Heart disease Sister   . Colon cancer Neg Hx   . Stomach cancer Neg Hx   . Anesthesia problems Neg Hx   . Hypotension Neg Hx   . Pseudochol deficiency Neg Hx   . Malignant hyperthermia Neg Hx    Family Psychiatric  History: unknown Social History:  History  Alcohol Use  . 36.0 oz/week  . 10 Standard drinks or equivalent, 50 Shots of liquor per week    Comment: 06/08/2016 "1/5th liquor maybe 2 days/week"     History  Drug Use  . Types: Marijuana    Comment: 06/08/2016  "maybe once/week"    Social History   Social History  . Marital status: Single    Spouse name: N/A  . Number of children: 0  . Years of education: 3   Occupational History  .    [employer]    worked for 6 years  . 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 Smoking status: Current Every Day Smoker    Packs/day: 0.25    Years: 42.00    Types: Cigarettes  . Smokeless tobacco: Never Used     Comment: 5 cigs per day  . Alcohol use 36.0 oz/week    10 Standard drinks or equivalent, 50 Shots of liquor per week     Comment: 06/08/2016 "1/5th liquor maybe 2 days/week"  . Drug use: Yes    Types: Marijuana     Comment: 06/08/2016  "maybe once/week"  . Sexual activity: Yes   Other Topics Concern  . None   Social History Narrative   Has been living at 3 different friend's homes since discharge 01/2011. Was living in the shelter prior to admission. Had previously lived with his niece and other family  members but he has gotten kicked out each time.       06/10/16 -pt reports lives w/sister and nieces   Additional Social History:    Allergies:   Allergies  Allergen Reactions  . Ace Inhibitors Swelling and Other (See Comments)    Angioedema - unquestionable  . Losartan Swelling and Other (See Comments)    Pt presented with unquestionable angioedema on ACEIs (Angiotensin-converting enzyme (ACE) inhibitors) so aviod ARBs (Angiotensin receptor blockers), as well  . Tylenol [Acetaminophen] Other (See Comments)    "cannot have this" (contraindicated)    Labs:  Results for orders placed or performed during the hospital encounter of 06/09/16 (from the past 48  hour(s))  Glucose, capillary     Status: Abnormal   Collection Time: 06/10/16  5:08 PM  Result Value Ref Range   Glucose-Capillary 233 (H) 65 - 99 mg/dL  Glucose, capillary     Status: Abnormal   Collection Time: 06/10/16 10:30 PM  Result Value Ref Range   Glucose-Capillary 319 (H) 65 - 99 mg/dL  Glucose, capillary     Status: Abnormal   Collection Time: 06/11/16  8:30 AM  Result Value Ref Range   Glucose-Capillary 222 (H) 65 - 99 mg/dL  Comprehensive metabolic panel     Status: Abnormal   Collection Time: 06/11/16 10:18 AM  Result Value Ref Range   Sodium 138 135 - 145 mmol/L   Potassium 3.4 (L) 3.5 - 5.1 mmol/L   Chloride 106 101 - 111 mmol/L   CO2 26 22 - 32 mmol/L   Glucose, Bld 212 (H) 65 - 99 mg/dL   BUN 8 6 - 20 mg/dL   Creatinine, Ser 0.71 0.61 - 1.24 mg/dL   Calcium 8.8 (L) 8.9 - 10.3 mg/dL   Total Protein 5.4 (L) 6.5 - 8.1 g/dL   Albumin 3.1 (L) 3.5 - 5.0 g/dL   AST 24 15 - 41 U/L   ALT 30 17 - 63 U/L   Alkaline Phosphatase 77 38 - 126 U/L   Total Bilirubin 0.6 0.3 - 1.2 mg/dL   GFR calc non Af Amer >60 >60 mL/min   GFR calc Af Amer >60 >60 mL/min    Comment: (NOTE) The eGFR has been calculated using the CKD EPI equation. This calculation has not been validated in all clinical situations. eGFR's  persistently <60 mL/min signify possible Chronic Kidney Disease.    Anion gap 6 5 - 15  CBC with Differential/Platelet     Status: Abnormal   Collection Time: 06/11/16 10:18 AM  Result Value Ref Range   WBC 5.3 4.0 - 10.5 K/uL   RBC 4.22 4.22 - 5.81 MIL/uL   Hemoglobin 13.3 13.0 - 17.0 g/dL   HCT 38.8 (L) 39.0 - 52.0 %   MCV 91.9 78.0 - 100.0 fL   MCH 31.5 26.0 - 34.0 pg   MCHC 34.3 30.0 - 36.0 g/dL   RDW 14.1 11.5 - 15.5 %   Platelets 172 150 - 400 K/uL   Neutrophils Relative % 36 %   Neutro Abs 1.9 1.7 - 7.7 K/uL   Lymphocytes Relative 44 %   Lymphs Abs 2.4 0.7 - 4.0 K/uL   Monocytes Relative 15 %   Monocytes Absolute 0.8 0.1 - 1.0 K/uL   Eosinophils Relative 5 %   Eosinophils Absolute 0.2 0.0 - 0.7 K/uL   Basophils Relative 0 %   Basophils Absolute 0.0 0.0 - 0.1 K/uL  Glucose, capillary     Status: Abnormal   Collection Time: 06/11/16 12:32 PM  Result Value Ref Range   Glucose-Capillary 194 (H) 65 - 99 mg/dL  Glucose, capillary     Status: Abnormal   Collection Time: 06/11/16  4:42 PM  Result Value Ref Range   Glucose-Capillary 353 (H) 65 - 99 mg/dL  Glucose, capillary     Status: Abnormal   Collection Time: 06/11/16  9:03 PM  Result Value Ref Range   Glucose-Capillary 258 (H) 65 - 99 mg/dL  Glucose, capillary     Status: Abnormal   Collection Time: 06/12/16  7:56 AM  Result Value Ref Range   Glucose-Capillary 124 (H) 65 - 99 mg/dL  Glucose, capillary  Status: Abnormal   Collection Time: 06/12/16 12:36 PM  Result Value Ref Range   Glucose-Capillary 314 (H) 65 - 99 mg/dL    Current Facility-Administered Medications  Medication Dose Route Frequency Provider Last Rate Last Dose  . amitriptyline (ELAVIL) tablet 50 mg  50 mg Oral QHS Rivet, Carly J, MD   50 mg at 06/11/16 2136  . ARIPiprazole (ABILIFY) tablet 5 mg  5 mg Oral Daily Rivet, Carly J, MD   5 mg at 06/12/16 0915  . DULoxetine (CYMBALTA) DR capsule 60 mg  60 mg Oral Daily Rivet, Carly J, MD   60 mg at  06/12/16 0915  . enoxaparin (LOVENOX) injection 40 mg  40 mg Subcutaneous Q24H Rivet, Carly J, MD   40 mg at 06/12/16 0913  . insulin aspart (novoLOG) injection 0-5 Units  0-5 Units Subcutaneous QHS Rivet, Sindy Guadel[place], MD   3 Units at 06/11/16 2135  . insulin aspart (novoLOG) injection 0-9 Units  0-9 Units Subcutaneous TID WC Rivet, Sindy Guadeloupe, MD   7 Units at 06/12/16 1247  . lipase/protease/amylase (CREON) capsule 36,000 Units  36,000 Units Oral TID AC Rivet, Carly J, MD   36,000 Units at 06/12/16 1246  . nicotine (NICODERM CQ - dosed in mg/24 hr) patch 7 mg  7 mg Transdermal Daily Norman Herrlich, MD   7 mg at 06/12/16 8502  . thiamine (B-1) injection 100 mg  100 mg Intramuscular TID Molt, Bethany, DO   100 mg at 06/12/16 1245    Musculoskeletal: Strength & Muscle Tone: decreased Gait & Station: unsteady Patient leans: N/A  Psychiatric Specialty Exam: Physical Exam  ROS  Blood pressure 107/73, pulse 88, temperature 98.5 F (36.9 C), resp. rate 17, height 5' 8"  (1.727 m), weight 72.4 kg (159 lb 9.8 oz), SpO2 100 %.Body mass index is 24.27 kg/m.  General Appearance: Casual and Fairly Groomed  Eye Contact:  Fair  Speech:  Garbled  Volume:  Normal  Mood:  Euthymic  Affect:  Congruent  Thought Process:  Irrelevant  Orientation:  Negative  Thought Content:  Logical  Suicidal Thoughts:  No  Homicidal Thoughts:  No  Memory:  Immediate;   Poor  Judgement:  Impaired  Insight:  Lacking  Psychomotor Activity:  Normal  Concentration:  Attention Span: Poor  Recall:  NA  Fund of Knowledge:  Poor  Language:  Fair  Akathisia:  Negative  Handed:  Right  AIMS (if indicated):     Assets:  Communication Skills Desire for Improvement Social Support  ADL's:  Intact  Cognition:  WNL  Sleep:       Treatment Plan Summary: Michael [name] is a 61 year old male with likely alcohol dementia presently admitted for AMS and gait instability in the setting of alcohol withdrawal.  He has continued to  struggle with periodic confusion, sundowning, and difficulty following directions.  He has had some vague psychotic phenomena but does not present on my exam with acute psychosis, mania, suicidal thoughts or homicidal ideation.  He does not require psychiatric hospitalization at this time.  I suspect he suffers with neurocognitive disorder and will continue to struggle with chronic bouts of confusion. He would benefit from coordination with family for dementia or nursing home placement, as I doubt he would be able to care for himself in this state.    I would recommend to taper and discontinue elavil given this can contribute to confusion in the elderly.  Okay to continue Cymbalta and Abilify at the  current doses.  If he was to have trouble sleeping AFTER tapering elavil, id suggest remeron 7.5 mg nightly.  Disposition: No evidence of imminent risk to self or others at present.   Patient does not meet criteria for psychiatric inpatient admission.  Aundra Dubin, MD 06/12/2016 4:07 PM

## 2016-06-12 NOTE — Progress Notes (Signed)
Internal Medicine Attending:   I saw and examined the patient. I reviewed the resident's note and I agree with the resident's findings and plan as documented in the resident's note.  Patient has no new complaints today and is oriented to time place and person. However, he continues to be confused when engaged in conversation. He is also noted to have lateral gaze defect on conjugate eye movements particularly of his left eye. Given his history of alcohol abuse and his ophthalmoplegia and ataxia this is likely secondary to Wernicke encephalopathy. Patient was on IV thiamine but pulled out his IV line yesterday and is currently receiving IM thiamine. We'll replace IV line today and continue with IV thiamine. We'll obtain MRI brain to rule out other etiologies of altered mental status and gaze defect. PT follow-up and recommendations appreciated. Psych consult called given patient was admitted from behavioral health where he had homicidal ideation but denies this now. We'll follow-up recommendations.

## 2016-06-13 DIAGNOSIS — Z886 Allergy status to analgesic agent status: Secondary | ICD-10-CM

## 2016-06-13 DIAGNOSIS — R419 Unspecified symptoms and signs involving cognitive functions and awareness: Secondary | ICD-10-CM

## 2016-06-13 DIAGNOSIS — F419 Anxiety disorder, unspecified: Secondary | ICD-10-CM | POA: Diagnosis not present

## 2016-06-13 DIAGNOSIS — F1010 Alcohol abuse, uncomplicated: Secondary | ICD-10-CM | POA: Diagnosis not present

## 2016-06-13 DIAGNOSIS — K219 Gastro-esophageal reflux disease without esophagitis: Secondary | ICD-10-CM | POA: Diagnosis not present

## 2016-06-13 DIAGNOSIS — E1142 Type 2 diabetes mellitus with diabetic polyneuropathy: Secondary | ICD-10-CM | POA: Diagnosis not present

## 2016-06-13 DIAGNOSIS — E512 Wernicke's encephalopathy: Secondary | ICD-10-CM | POA: Diagnosis not present

## 2016-06-13 DIAGNOSIS — F10288 Alcohol dependence with other alcohol-induced disorder: Secondary | ICD-10-CM

## 2016-06-13 DIAGNOSIS — F319 Bipolar disorder, unspecified: Secondary | ICD-10-CM | POA: Diagnosis not present

## 2016-06-13 DIAGNOSIS — Z794 Long term (current) use of insulin: Secondary | ICD-10-CM | POA: Diagnosis not present

## 2016-06-13 DIAGNOSIS — K860 Alcohol-induced chronic pancreatitis: Secondary | ICD-10-CM | POA: Diagnosis not present

## 2016-06-13 DIAGNOSIS — Z888 Allergy status to other drugs, medicaments and biological substances status: Secondary | ICD-10-CM | POA: Diagnosis not present

## 2016-06-13 DIAGNOSIS — R269 Unspecified abnormalities of gait and mobility: Secondary | ICD-10-CM | POA: Diagnosis not present

## 2016-06-13 DIAGNOSIS — F17210 Nicotine dependence, cigarettes, uncomplicated: Secondary | ICD-10-CM | POA: Diagnosis not present

## 2016-06-13 DIAGNOSIS — F209 Schizophrenia, unspecified: Secondary | ICD-10-CM | POA: Diagnosis not present

## 2016-06-13 LAB — GLUCOSE, CAPILLARY
Glucose-Capillary: 153 mg/dL — ABNORMAL HIGH (ref 65–99)
Glucose-Capillary: 317 mg/dL — ABNORMAL HIGH (ref 65–99)
Glucose-Capillary: 277 mg/dL — ABNORMAL HIGH (ref 65–99)

## 2016-06-13 NOTE — Progress Notes (Signed)
   Subjective: Michael [name] was seen and evaluated today at bedside. Patient reportedly was told he was going home today by some man with a neck tie and referenced the medicare non-payment letter on his recliner. He feels quite ready for discharge. States he lives with his sister who is home with him 24/7.   There has been some issue with Medicare thinking that his current presentation is a chronic complaint... I need to emphasize AGAIN that this is NEW ophthalmoplegia, intermittent confusion and lethargy for which we were evaluation further with labs and MRI.   Objective:  Vital signs in last 24 hours: Vitals:   06/12/16 0544 06/12/16 1509 06/12/16 2147 06/13/16 0645  BP: 133/88 107/73 105/70 104/67  Pulse: 87 88 82 82  Resp: 18 17 20 18   Temp: 97.5 F (36.4 C) 98.5 F (36.9 C) 98 F (36.7 C) 98.1 F (36.7 C)  TempSrc: Oral  Oral   SpO2: 100% 100% 100% 100%  Weight:      Height:       General: Chronically-ill appearing AA male resting comfortably in bed without distress.  Neuro: Oriented without lethargy. Ophthalmoplegia still appreciated.  Cardiovascular: Regular rate and rhythm. No murmur or rub appreciated. Pulmonary: CTA BL. Normal WOB on RA. Abdomen: Soft, NTND. No rigidity. No peripheral edema appreciated BL LE. Skin: Warm, dry. No cyanosis.   Assessment/Plan:  Active Problems:   Altered mental status   Wernicke's encephalopathy   Alcohol abuse  Wernike Encephalopathy, AMS, Bipolar Disorder, Schizophrenia in patient with history of Alcohol Abuse: MRI without acute intracranial process. Did show chronic microvascular disease and small remote BL thalamic lacunar infarctions. Michael [name] was seen by psychiatry who do not see need for inpatient psych admission as pt remains without SI/HI however did note this seems most consistent with a neurocognitive disorder, likely secondary to his chronic alcohol abuse. -IM Thiamine TID, patient continues to pull out IV.  -PT recs 24 hr  supervision/assistance. Pt refuses SNF. He notes that he lives with his 2 sisters who are able to provide this level of care.  -Continue to hold sedating medications  Brittle Type 2 Diabetes, Chronic Pancreatic Insufficiency: CBGs currently within acceptable range for this extremely brittle diabetic. Hx of hypoglycemic seizures.  -Sliding scale ONLY -Outpatient HbA1c -[facility]  Dispo: Anticipated discharge today after confirming support with family.   Michael Mcilhenny, DO 06/13/2016, 7:37 AM Pager: [phone]

## 2016-06-13 NOTE — Discharge Planning (Addendum)
Patient IV has been removed.  RN assessment and VS revealed stability for DC to home with HH ([facility] - PT, SW and Aide).  Discharge papers given, explained and educated.  Patient going home to live with sister. Sending home with walker. Informed of suggested FU appts and appt made.  No scripts needed at this time.  When ready will be wheeled to front and family transporting home via car. RN transported patient down to sister's car and explained DC plans at curb side, per sister's request.

## 2016-06-13 NOTE — Progress Notes (Signed)
Physical Therapy Treatment Patient Details Name: COLE EASTRIDGE MRN: [id number] DOB: 08/01/1955 Today's Date: 06/13/2016    History of Present Illness Pt is a 61 y/o male admitted from behavioral health hospital secondary to AMS and halucinations. Pt previously admitted secondary to suicidal ideations and intoxication. PMH includes bipolar disorder, alcoholism, chronic alcoholic pancreatitis, DM, and schizophrenia.     PT Comments    Pt is progressing toward mobility goals. Pt able to ambulate 114ft with RW and min A for safety due to balance deficits. Pt continues to be confused at times. Continue to progress as tolerated and current plan appropriate.   Follow Up Recommendations  Other (comment);Supervision/Assistance - 24 hour (return to [facility])     Equipment Recommendations  Rolling walker with 5" wheels    Recommendations for Other Services       Precautions / Restrictions Precautions Precautions: Fall Restrictions Weight Bearing Restrictions: No    Mobility  Bed Mobility               General bed mobility comments: sitting EOB upon arrival with bed alarm set  Transfers Overall transfer level: Needs assistance Equipment used: Rolling walker (2 wheeled) Transfers: Sit to/from Stand Sit to Stand: Min assist;From elevated surface         General transfer comment: assist to steady during transition of hand placement to RW and upon stand  Ambulation/Gait Ambulation/Gait assistance: Min assist Ambulation Distance (Feet): 140 Feet Assistive device: Rolling walker (2 wheeled) Gait Pattern/deviations: Step-through pattern;Decreased stride length;Narrow base of support;Trunk flexed;Decreased dorsiflexion - right;Decreased dorsiflexion - left Gait velocity: decreased   General Gait Details: multimodal cues for posture and mod vc for increased bilat step length; assist to manage RW and vc for safe proximity   Stairs            Wheelchair Mobility    Modified  [place]in (Stroke Patients Only)       Balance Overall balance assessment: Needs assistance Sitting-balance support: Bilateral upper extremity supported;Feet supported Sitting balance-Leahy Scale: Fair     Standing balance support: Bilateral upper extremity supported;During functional activity Standing balance-Leahy Scale: Poor                 High Level Balance Comments: pt unsteady with horizontal head turns with AD            Cognition Arousal/Alertness: Awake/alert Behavior During Therapy: Flat affect Overall Cognitive Status: No family/caregiver present to determine baseline cognitive functioning Area of Impairment: Memory;Following commands;Safety/judgement;Awareness;Problem solving                     Memory: Decreased short-term memory Following Commands: Follows one step commands inconsistently;Follows one step commands with increased time Safety/Judgement: Decreased awareness of safety;Decreased awareness of deficits Awareness: Emergent Problem Solving: Difficulty sequencing;Requires verbal cues General Comments: pt confused at times during session; end of session pt reported he was going to "head on out" because his sister is here to pick him up      Exercises      General Comments General comments (skin integrity, edema, etc.): pt with difficulty navigating objects on L side      Pertinent Vitals/Pain Pain Assessment: Faces Faces Pain Scale: Hurts little more Pain Location: back and bilat knees Pain Descriptors / Indicators: Aching Pain Intervention(s): Monitored during session    Home Living                      Prior Function  PT Goals (current goals can now be found in the care plan section) Acute Rehab PT Goals PT Goal Formulation: Patient unable to participate in goal setting Time For Goal Achievement: 06/26/16 Potential to Achieve Goals: Fair Progress towards PT goals: Progressing toward goals     Frequency    Min 3X/week      PT Plan Current plan remains appropriate    Co-evaluation              AM-PAC PT "6 Clicks" Daily Activity  Outcome Measure  Difficulty turning over in bed (including adjusting bedclothes, sheets and blankets)?: A Little Difficulty moving from lying on back to sitting on the side of the bed? : A Little Difficulty sitting down on and standing up from a chair with arms (e.g., wheelchair, bedside commode, etc,.)?: Total Help needed moving to and from a bed to chair (including a wheelchair)?: A Little Help needed walking in hospital room?: A Little Help needed climbing 3-5 steps with a railing? : A Lot 6 Click Score: 15    End of Session Equipment Utilized During Treatment: Gait belt Activity Tolerance: Patient tolerated treatment well Patient left: in chair;with call bell/phone within reach;with chair alarm set Nurse Communication: Mobility status PT Visit Diagnosis: Other abnormalities of gait and mobility (R26.89);Unsteadiness on feet (R26.81)     Time: 1655-3748 PT Time Calculation (min) (ACUTE ONLY): 22 min  Charges:  $Gait Training: 8-22 mins                    G Codes:       Earney Navy, PTA Pager: [phone]     Darliss Cheney 06/13/2016, 12:06 PM

## 2016-06-13 NOTE — Progress Notes (Signed)
Iv team came to put another line in but pt refused

## 2016-06-13 NOTE — Progress Notes (Signed)
Internal Medicine Attending:   I saw and examined the patient. I reviewed the resident's note and I agree with the resident's findings and plan as documented in the resident's note.  Patient feels well today and wants to go home. He still having intermittent episodes of confusion and will require 24-hour supervision at home. Psychiatry follow-up and recommendations appreciated. Patient no longer with suicidal or homicidal ideation and is not a candidate for inpatient psychiatric hospitalization at this time. It is likely that his confusion is secondary to a cognitive defect secondary to his alcohol abuse versus Wernicke encephalopathy. MRI without evidence of any acute intracranial process but did show small remote bilateral thalamic lacunar infarcts. Continue with thiamine for now. No further inpatient workup at this time. Patient will need close follow-up as an outpatient. Patient refusing SNF. Sister would like patient to stay with her at home. Patient to be discharged to his sister's house where he will be supervised.

## 2016-06-13 NOTE — Progress Notes (Signed)
CSW received call from [facility] stating that patient needed to discharge today due to Observation status and that the Medical Director approved an log if needed. Patient is disoriented, so CSW called patient's sister [name]. She reported that patient lives with her and that she would like for him to return home with home health instead of SNF. CSW alerted her of plan to discharge patient today. She stated agreement and that she needed to pick up his things from [facility] as well. CSW confirmed that address on [place] is correct. RNCM alerted of home health needs.  CSW signing off.  Percell Locus Amram Maya LCSWA [phone]

## 2016-06-13 NOTE — Care Management Note (Signed)
Case Management Note  Patient Details  Name: Michael [name] MRN: [id number] Date of Birth: [birth year]  Subjective/Objective:     Brittle DM,  Chronic Pancreatic Insufficiency, Bipolar Disorder, Schizophrenia, ETOH abuse              Action/Plan: Discharge Planning: NCM spoke to pt's sister, [name] # [phone] via phone. Offered choice for HH/list left in room. Pt had [facility] in the past. Contacted [facility] Liaison for [facility] and RW for home. Pt had [place] [employer] PCS services in the past. Left application for Unit RN to give to sister when she comes. Will have PCP complete at appt on Friday 5/25 at 245 pm.   PCP Vernelle Emerald MD  Expected Discharge Date:  06/13/16               Expected Discharge Plan:  [place]  In-House Referral:  Clinical Social Work  Discharge planning Services  CM Consult  Post Acute Care Choice:  Home Health Choice offered to:  Sibling  DME Arranged:  [facility] rolling DME Agency:  [place] Arranged:  RN, PT, OT, Nurse's Aide, Social Work, Refused SNF [place] Agency:  [place]  Status of Service:  Completed, signed off  If discussed at [employer], dates discussed:    Additional Comments:  Erenest Rasher, RN 06/13/2016, 4:01 PM

## 2016-06-14 ENCOUNTER — Other Ambulatory Visit: Payer: Self-pay | Admitting: *Deleted

## 2016-06-14 ENCOUNTER — Telehealth: Payer: Self-pay

## 2016-06-14 DIAGNOSIS — E0810 Diabetes mellitus due to underlying condition with ketoacidosis without coma: Secondary | ICD-10-CM

## 2016-06-14 DIAGNOSIS — E1142 Type 2 diabetes mellitus with diabetic polyneuropathy: Secondary | ICD-10-CM

## 2016-06-14 DIAGNOSIS — Z794 Long term (current) use of insulin: Secondary | ICD-10-CM

## 2016-06-14 NOTE — Telephone Encounter (Signed)
Pt sister is calling back to speak with a nurse about Novolog insulin. Please call back.

## 2016-06-14 NOTE — Telephone Encounter (Signed)
Pt sister is calling back about meds. Please call back.

## 2016-06-14 NOTE — Telephone Encounter (Signed)
Called request novolog refill reports patient just discharged from hospital does not have any insulin at home and cannot wait for mail order, refill called into [facility]

## 2016-06-14 NOTE — Telephone Encounter (Signed)
Pt wants novolog pens, not vials

## 2016-06-15 ENCOUNTER — Telehealth: Payer: Self-pay | Admitting: Internal Medicine

## 2016-06-15 DIAGNOSIS — K860 Alcohol-induced chronic pancreatitis: Secondary | ICD-10-CM | POA: Diagnosis not present

## 2016-06-15 DIAGNOSIS — F10239 Alcohol dependence with withdrawal, unspecified: Secondary | ICD-10-CM | POA: Diagnosis not present

## 2016-06-15 DIAGNOSIS — E1140 Type 2 diabetes mellitus with diabetic neuropathy, unspecified: Secondary | ICD-10-CM | POA: Diagnosis not present

## 2016-06-15 DIAGNOSIS — Z794 Long term (current) use of insulin: Secondary | ICD-10-CM | POA: Diagnosis not present

## 2016-06-15 DIAGNOSIS — F209 Schizophrenia, unspecified: Secondary | ICD-10-CM | POA: Diagnosis not present

## 2016-06-15 DIAGNOSIS — F319 Bipolar disorder, unspecified: Secondary | ICD-10-CM | POA: Diagnosis not present

## 2016-06-15 DIAGNOSIS — F17210 Nicotine dependence, cigarettes, uncomplicated: Secondary | ICD-10-CM | POA: Diagnosis not present

## 2016-06-15 MED ORDER — GLUCOSE BLOOD VI STRP
ORAL_STRIP | 12 refills | Status: DC
Start: 2016-06-15 — End: 2016-10-02

## 2016-06-15 MED ORDER — VITAMIN B-1 100 MG PO TABS: 100.0000 mg | ORAL_TABLET | Freq: Every day | ORAL | 2 refills | Status: DC

## 2016-06-15 MED ORDER — INSULIN ASPART 100 UNIT/ML ~~LOC~~ SOLN: 0.0000 [IU] | Freq: Three times a day (TID) | SUBCUTANEOUS | 0 refills | Status: DC

## 2016-06-15 MED ORDER — INSULIN PEN NEEDLE 31G X 5 MM MISC: 5 refills | Status: DC

## 2016-06-15 MED ORDER — INSULIN ASPART 100 UNIT/ML FLEXPEN: PEN_INJECTOR | SUBCUTANEOUS | 3 refills | Status: DC

## 2016-06-15 NOTE — Telephone Encounter (Signed)
done

## 2016-06-15 NOTE — Telephone Encounter (Signed)
PATIENT NEEDS INSULIN PEN, [facility],

## 2016-06-15 NOTE — Telephone Encounter (Signed)
Pt's sister, [name], said pt need new rx for Novolog pens not the vials. Also need rx for Acuu-chek Avita Plus test strips. Uses [facility] pharmacy on [address].

## 2016-06-16 ENCOUNTER — Encounter: Payer: Medicare HMO | Admitting: Internal Medicine

## 2016-06-18 ENCOUNTER — Emergency Department (HOSPITAL_COMMUNITY): Payer: Medicare HMO

## 2016-06-18 ENCOUNTER — Inpatient Hospital Stay (HOSPITAL_COMMUNITY)
Admission: EM | Admit: 2016-06-18 | Discharge: 2016-06-22 | DRG: 640 | Disposition: A | Discharge status: Skilled Nursing Facility | Payer: Medicare HMO | Attending: Internal Medicine | Admitting: Internal Medicine

## 2016-06-18 DIAGNOSIS — E1151 Type 2 diabetes mellitus with diabetic peripheral angiopathy without gangrene: Secondary | ICD-10-CM | POA: Diagnosis not present

## 2016-06-18 DIAGNOSIS — F419 Anxiety disorder, unspecified: Secondary | ICD-10-CM | POA: Diagnosis not present

## 2016-06-18 DIAGNOSIS — E11649 Type 2 diabetes mellitus with hypoglycemia without coma: Secondary | ICD-10-CM | POA: Diagnosis not present

## 2016-06-18 DIAGNOSIS — R262 Difficulty in walking, not elsewhere classified: Secondary | ICD-10-CM | POA: Diagnosis not present

## 2016-06-18 DIAGNOSIS — E512 Wernicke's encephalopathy: Principal | ICD-10-CM | POA: Diagnosis present

## 2016-06-18 DIAGNOSIS — R296 Repeated falls: Secondary | ICD-10-CM | POA: Diagnosis not present

## 2016-06-18 DIAGNOSIS — E1169 Type 2 diabetes mellitus with other specified complication: Secondary | ICD-10-CM | POA: Diagnosis not present

## 2016-06-18 DIAGNOSIS — F319 Bipolar disorder, unspecified: Secondary | ICD-10-CM | POA: Diagnosis present

## 2016-06-18 DIAGNOSIS — E43 Unspecified severe protein-calorie malnutrition: Secondary | ICD-10-CM | POA: Diagnosis not present

## 2016-06-18 DIAGNOSIS — K219 Gastro-esophageal reflux disease without esophagitis: Secondary | ICD-10-CM | POA: Diagnosis present

## 2016-06-18 DIAGNOSIS — R404 Transient alteration of awareness: Secondary | ICD-10-CM | POA: Diagnosis not present

## 2016-06-18 DIAGNOSIS — Z8639 Personal history of other endocrine, nutritional and metabolic disease: Secondary | ICD-10-CM | POA: Diagnosis not present

## 2016-06-18 DIAGNOSIS — Z79899 Other long term (current) drug therapy: Secondary | ICD-10-CM | POA: Diagnosis not present

## 2016-06-18 DIAGNOSIS — E559 Vitamin D deficiency, unspecified: Secondary | ICD-10-CM | POA: Diagnosis present

## 2016-06-18 DIAGNOSIS — E785 Hyperlipidemia, unspecified: Secondary | ICD-10-CM | POA: Diagnosis present

## 2016-06-18 DIAGNOSIS — W19XXXA Unspecified fall, initial encounter: Secondary | ICD-10-CM

## 2016-06-18 DIAGNOSIS — R531 Weakness: Secondary | ICD-10-CM | POA: Diagnosis not present

## 2016-06-18 DIAGNOSIS — R270 Ataxia, unspecified: Secondary | ICD-10-CM

## 2016-06-18 DIAGNOSIS — E46 Unspecified protein-calorie malnutrition: Secondary | ICD-10-CM | POA: Diagnosis not present

## 2016-06-18 DIAGNOSIS — M6281 Muscle weakness (generalized): Secondary | ICD-10-CM | POA: Diagnosis not present

## 2016-06-18 DIAGNOSIS — F209 Schizophrenia, unspecified: Secondary | ICD-10-CM | POA: Diagnosis not present

## 2016-06-18 DIAGNOSIS — R338 Other retention of urine: Secondary | ICD-10-CM | POA: Diagnosis not present

## 2016-06-18 DIAGNOSIS — F1021 Alcohol dependence, in remission: Secondary | ICD-10-CM | POA: Diagnosis not present

## 2016-06-18 DIAGNOSIS — K861 Other chronic pancreatitis: Secondary | ICD-10-CM | POA: Diagnosis not present

## 2016-06-18 DIAGNOSIS — R410 Disorientation, unspecified: Secondary | ICD-10-CM | POA: Diagnosis not present

## 2016-06-18 DIAGNOSIS — E118 Type 2 diabetes mellitus with unspecified complications: Secondary | ICD-10-CM | POA: Diagnosis not present

## 2016-06-18 DIAGNOSIS — Z794 Long term (current) use of insulin: Secondary | ICD-10-CM | POA: Diagnosis not present

## 2016-06-18 DIAGNOSIS — E1142 Type 2 diabetes mellitus with diabetic polyneuropathy: Secondary | ICD-10-CM | POA: Diagnosis present

## 2016-06-18 DIAGNOSIS — R488 Other symbolic dysfunctions: Secondary | ICD-10-CM | POA: Diagnosis not present

## 2016-06-18 DIAGNOSIS — K8681 Exocrine pancreatic insufficiency: Secondary | ICD-10-CM | POA: Diagnosis not present

## 2016-06-18 DIAGNOSIS — F451 Undifferentiated somatoform disorder: Secondary | ICD-10-CM | POA: Diagnosis not present

## 2016-06-18 DIAGNOSIS — R4182 Altered mental status, unspecified: Secondary | ICD-10-CM | POA: Diagnosis not present

## 2016-06-18 DIAGNOSIS — E119 Type 2 diabetes mellitus without complications: Secondary | ICD-10-CM | POA: Diagnosis not present

## 2016-06-18 DIAGNOSIS — M170 Bilateral primary osteoarthritis of knee: Secondary | ICD-10-CM | POA: Diagnosis present

## 2016-06-18 DIAGNOSIS — M25569 Pain in unspecified knee: Secondary | ICD-10-CM | POA: Diagnosis not present

## 2016-06-18 DIAGNOSIS — F10239 Alcohol dependence with withdrawal, unspecified: Secondary | ICD-10-CM | POA: Diagnosis not present

## 2016-06-18 DIAGNOSIS — R471 Dysarthria and anarthria: Secondary | ICD-10-CM | POA: Diagnosis not present

## 2016-06-18 LAB — CBC
HEMATOCRIT: 37.5 % — AB (ref 39.0–52.0)
Hemoglobin: 12.9 g/dL — ABNORMAL LOW (ref 13.0–17.0)
MCH: 31.9 pg (ref 26.0–34.0)
MCHC: 34.4 g/dL (ref 30.0–36.0)
MCV: 92.6 fL (ref 78.0–100.0)
PLATELETS: 204 10*3/uL (ref 150–400)
RBC: 4.05 MIL/uL — ABNORMAL LOW (ref 4.22–5.81)
RDW: 14.3 % (ref 11.5–15.5)
WBC: 7.1 10*3/uL (ref 4.0–10.5)

## 2016-06-18 LAB — DIFFERENTIAL
BASOS PCT: 0 %
Basophils Absolute: 0 10*3/uL (ref 0.0–0.1)
EOS PCT: 4 %
Eosinophils Absolute: 0.3 10*3/uL (ref 0.0–0.7)
LYMPHS PCT: 27 %
Lymphs Abs: 1.9 10*3/uL (ref 0.7–4.0)
MONO ABS: 0.7 10*3/uL (ref 0.1–1.0)
MONOS PCT: 10 %
Neutro Abs: 4.2 10*3/uL (ref 1.7–7.7)
Neutrophils Relative %: 59 %

## 2016-06-18 LAB — ETHANOL

## 2016-06-18 LAB — I-STAT TROPONIN, ED: TROPONIN I, POC: 0 ng/mL (ref 0.00–0.08)

## 2016-06-18 LAB — POC OCCULT BLOOD, ED: Fecal Occult Bld: NEGATIVE

## 2016-06-18 LAB — COMPREHENSIVE METABOLIC PANEL
ALK PHOS: 82 U/L (ref 38–126)
ALT: 26 U/L (ref 17–63)
ANION GAP: 4 — AB (ref 5–15)
AST: 33 U/L (ref 15–41)
Albumin: 2.9 g/dL — ABNORMAL LOW (ref 3.5–5.0)
BUN: 10 mg/dL (ref 6–20)
CALCIUM: 8.6 mg/dL — AB (ref 8.9–10.3)
CO2: 31 mmol/L (ref 22–32)
Chloride: 108 mmol/L (ref 101–111)
Creatinine, Ser: 0.93 mg/dL (ref 0.61–1.24)
Glucose, Bld: 116 mg/dL — ABNORMAL HIGH (ref 65–99)
Potassium: 3.6 mmol/L (ref 3.5–5.1)
SODIUM: 143 mmol/L (ref 135–145)
TOTAL PROTEIN: 5 g/dL — AB (ref 6.5–8.1)
Total Bilirubin: 0.3 mg/dL (ref 0.3–1.2)

## 2016-06-18 LAB — PHOSPHORUS: Phosphorus: 3.3 mg/dL (ref 2.5–4.6)

## 2016-06-18 LAB — GLUCOSE, CAPILLARY: Glucose-Capillary: 115 mg/dL — ABNORMAL HIGH (ref 65–99)

## 2016-06-18 LAB — PROTIME-INR
INR: 1.02
PROTHROMBIN TIME: 13.4 s (ref 11.4–15.2)

## 2016-06-18 LAB — VITAMIN B12: Vitamin B-12: 985 pg/mL — ABNORMAL HIGH (ref 180–914)

## 2016-06-18 LAB — APTT: aPTT: 31 seconds (ref 24–36)

## 2016-06-18 LAB — MAGNESIUM: Magnesium: 1.8 mg/dL (ref 1.7–2.4)

## 2016-06-18 MED ORDER — SODIUM CHLORIDE 0.9 % IV BOLUS (SEPSIS)
1000.0000 mL | Freq: Once | INTRAVENOUS | Status: AC
  Administered 2016-06-18: 1000 mL via INTRAVENOUS

## 2016-06-18 MED ORDER — IBUPROFEN 200 MG PO TABS
800.0000 mg | ORAL_TABLET | Freq: Three times a day (TID) | ORAL | Status: DC | PRN
Start: 2016-06-18 — End: 2016-06-22
  Administered 2016-06-21: 800 mg via ORAL
  Filled 2016-06-18 (×2): qty 4

## 2016-06-18 MED ORDER — DICLOFENAC SODIUM 1 % TD GEL
2.0000 g | Freq: Three times a day (TID) | TRANSDERMAL | Status: DC
  Administered 2016-06-19 – 2016-06-21 (×9): 2 g via TOPICAL
  Filled 2016-06-18: qty 100

## 2016-06-18 MED ORDER — PRAVASTATIN SODIUM 10 MG PO TABS
10.0000 mg | ORAL_TABLET | Freq: Every day | ORAL | Status: DC
  Administered 2016-06-19 – 2016-06-21 (×3): 10 mg via ORAL
  Filled 2016-06-18 (×4): qty 1

## 2016-06-18 MED ORDER — INSULIN ASPART 100 UNIT/ML ~~LOC~~ SOLN
0.0000 [IU] | Freq: Three times a day (TID) | SUBCUTANEOUS | Status: DC
  Administered 2016-06-19 (×2): 1 [IU] via SUBCUTANEOUS
  Administered 2016-06-19: 5 [IU] via SUBCUTANEOUS
  Administered 2016-06-20: 3 [IU] via SUBCUTANEOUS
  Administered 2016-06-20: 2 [IU] via SUBCUTANEOUS
  Administered 2016-06-20 – 2016-06-21 (×2): 5 [IU] via SUBCUTANEOUS
  Administered 2016-06-21: 7 [IU] via SUBCUTANEOUS
  Administered 2016-06-21: 2 [IU] via SUBCUTANEOUS
  Administered 2016-06-22: 3 [IU] via SUBCUTANEOUS
  Administered 2016-06-22: 5 [IU] via SUBCUTANEOUS

## 2016-06-18 MED ORDER — PANCRELIPASE (LIP-PROT-AMYL) 36000-114000 UNITS PO CPEP
36000.0000 [IU] | ORAL_CAPSULE | Freq: Three times a day (TID) | ORAL | Status: DC
  Administered 2016-06-19 – 2016-06-22 (×10): 36000 [IU] via ORAL
  Filled 2016-06-18 (×10): qty 1

## 2016-06-18 MED ORDER — THIAMINE HCL 100 MG/ML IJ SOLN
100.0000 mg | Freq: Once | INTRAMUSCULAR | Status: AC
  Administered 2016-06-18: 100 mg via INTRAVENOUS
  Filled 2016-06-18: qty 2

## 2016-06-18 MED ORDER — PANTOPRAZOLE SODIUM 40 MG PO TBEC
40.0000 mg | DELAYED_RELEASE_TABLET | Freq: Every day | ORAL | Status: DC
  Administered 2016-06-19 – 2016-06-22 (×4): 40 mg via ORAL
  Filled 2016-06-18 (×4): qty 1

## 2016-06-18 MED ORDER — FOLIC ACID 1 MG PO TABS
1.0000 mg | ORAL_TABLET | Freq: Every day | ORAL | Status: DC
  Administered 2016-06-18 – 2016-06-22 (×5): 1 mg via ORAL
  Filled 2016-06-18 (×5): qty 1

## 2016-06-18 MED ORDER — INSULIN ASPART 100 UNIT/ML ~~LOC~~ SOLN: 0.0000 [IU] | Freq: Three times a day (TID) | SUBCUTANEOUS | Status: DC

## 2016-06-18 MED ORDER — DICLOFENAC SODIUM 1 % TD GEL: 2.0000 g | Freq: Four times a day (QID) | TRANSDERMAL | Status: DC

## 2016-06-18 MED ORDER — DULOXETINE HCL 60 MG PO CPEP
60.0000 mg | ORAL_CAPSULE | Freq: Every day | ORAL | Status: DC
  Administered 2016-06-19 – 2016-06-22 (×4): 60 mg via ORAL
  Filled 2016-06-18 (×5): qty 1

## 2016-06-18 MED ORDER — AMITRIPTYLINE HCL 25 MG PO TABS
50.0000 mg | ORAL_TABLET | Freq: Every day | ORAL | Status: DC
  Administered 2016-06-18: 50 mg via ORAL
  Filled 2016-06-18: qty 2

## 2016-06-18 MED ORDER — LORAZEPAM 1 MG PO TABS
1.0000 mg | ORAL_TABLET | Freq: Four times a day (QID) | ORAL | Status: DC | PRN
  Filled 2016-06-18: qty 1

## 2016-06-18 MED ORDER — ENOXAPARIN SODIUM 40 MG/0.4ML ~~LOC~~ SOLN
40.0000 mg | Freq: Every day | SUBCUTANEOUS | Status: DC
  Administered 2016-06-19 – 2016-06-22 (×4): 40 mg via SUBCUTANEOUS
  Filled 2016-06-18 (×4): qty 0.4

## 2016-06-18 MED ORDER — THIAMINE HCL 100 MG/ML IJ SOLN
100.0000 mg | Freq: Every day | INTRAMUSCULAR | Status: DC
  Administered 2016-06-18 – 2016-06-19 (×2): 100 mg via INTRAVENOUS
  Filled 2016-06-18 (×2): qty 2

## 2016-06-18 MED ORDER — INSULIN ASPART 100 UNIT/ML ~~LOC~~ SOLN
0.0000 [IU] | Freq: Every day | SUBCUTANEOUS | Status: DC
  Administered 2016-06-21: 5 [IU] via SUBCUTANEOUS

## 2016-06-18 MED ORDER — INSULIN ASPART 100 UNIT/ML ~~LOC~~ SOLN: 0.0000 [IU] | Freq: Every day | SUBCUTANEOUS | Status: DC

## 2016-06-18 MED ORDER — ARIPIPRAZOLE 5 MG PO TABS
5.0000 mg | ORAL_TABLET | Freq: Every day | ORAL | Status: DC
  Administered 2016-06-19 – 2016-06-22 (×4): 5 mg via ORAL
  Filled 2016-06-18 (×6): qty 1

## 2016-06-18 MED ORDER — SODIUM CHLORIDE 0.9 % IV SOLN
INTRAVENOUS | Status: DC
  Administered 2016-06-18 – 2016-06-21 (×5): via INTRAVENOUS

## 2016-06-18 MED ORDER — LORAZEPAM 2 MG/ML IJ SOLN
1.0000 mg | Freq: Four times a day (QID) | INTRAMUSCULAR | Status: DC | PRN
  Administered 2016-06-19 – 2016-06-20 (×3): 1 mg via INTRAVENOUS
  Filled 2016-06-18 (×3): qty 1

## 2016-06-18 MED ORDER — ADULT MULTIVITAMIN W/MINERALS CH
1.0000 | ORAL_TABLET | Freq: Every day | ORAL | Status: DC
Start: 2016-06-18 — End: 2016-06-22
  Administered 2016-06-18 – 2016-06-22 (×5): 1 via ORAL
  Filled 2016-06-18 (×5): qty 1

## 2016-06-18 NOTE — ED Notes (Signed)
Pt found sitting in a chair next to his bed stating he's going home. Pt easily directed back to bed and given graham crackers, sandwich and Coke to drink per request. Family at bedside

## 2016-06-18 NOTE — ED Notes (Signed)
Informed Dr. Winfred Leeds of pt's BP (89/74).
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
## 2016-06-18 NOTE — ED Provider Notes (Signed)
[facility] DEPT Provider Note   CSN: [id number] Arrival date & time: 06/18/16  1551   level05 caveat altered mental status, unstable vitals   History   Chief Complaint Chief Complaint  Patient presents with  . Weakness    HPI Michael [name] is a 61 y.o. male.  HPI Patient reports that he was sent here by his sister by EMS as she's had multiple falls over recent weeks. Denies pain anywhere. I attempted to call patient's home numbers for further history, no answer. He denies weakness. Denies fever. Denies other associated symptoms. Dr. Benjamine Mola, patient's PMD was present in ED and interviewed patient with me who states that patient's speech is more slurred than baseline. Patient was supposed to go to clinic appointment 2 days ago but did not appear . Past Medical History:  Diagnosis Date  . Anxiety   . Arthritis    "joints; shoulders; feet" (06/08/2016)  . Bipolar disorder ([place])   . Daily headache    "7:30 - 8:00 q night" (06/08/2016)  . Diabetic peripheral neuropathy ([place])   . GERD (gastroesophageal reflux disease)   . Pancreatitis   . Schizophrenia ([place])   . Seizures ([place]) 01/2016 X 2  . Type II diabetes mellitus [facility])     Patient Active Problem List   Diagnosis Date Noted  . Wernicke's encephalopathy   . Alcohol abuse   . Delirium 06/09/2016  . Altered mental status 06/09/2016  . Alcohol withdrawal ([place]) 06/05/2016  . Bipolar disorder ([place]) 06/01/2016  . Diabetes mellitus due to underlying condition with ketoacidosis without coma, with long-term current use of insulin ([place]) 02/04/2016  . Dry skin 01/18/2016  . Peripheral vascular disease ([place]) 11/15/2015  . Tobacco abuse 09/02/2015  . Tricompartment osteoarthritis of both knees 06/15/2015  . Chronic abdominal pain 03/14/2015  . Vitamin D deficiency 03/12/2015  . Hyperlipidemia 02/17/2015  . Fall 12/24/2014  . Protein-calorie malnutrition, severe 11/18/2014  . Alcohol use disorder ([place])   . Cervical arthritis ([place])  01/01/2014  . Health care maintenance 08/25/2011  . Chronic alcoholic pancreatitis ([place]) 04/20/2011  . Type 2 diabetes mellitus with peripheral neuropathy ([place]) 12/09/2010  . HTN (hypertension) 12/09/2010    Past Surgical History:  Procedure Laterality Date  . BALLOON DILATION  03/22/2011   Procedure: BALLOON DILATION;  Surgeon: Gatha Mayer, MD;  Location: [facility] ENDOSCOPY;  Service: Endoscopy;  Laterality: N/A;  . COLONOSCOPY N/A 05/22/2012   Procedure: COLONOSCOPY;  Surgeon: Gatha Mayer, MD;  Location: [facility];  Service: Endoscopy;  Laterality: N/A;  . COLONOSCOPY W/ BIOPSIES AND POLYPECTOMY  09/12/11  . ESOPHAGOGASTRODUODENOSCOPY  03/22/2011   Procedure: ESOPHAGOGASTRODUODENOSCOPY (EGD);  Surgeon: Gatha Mayer, MD;  Location: [facility];  Service: Endoscopy;  Laterality: N/A;  egd with balloon   . EUS  04/20/2011   Procedure: UPPER ENDOSCOPIC ULTRASOUND (EUS) LINEAR;  Surgeon: Milus Banister, MD;  Location: [facility] ENDOSCOPY;  Service: Endoscopy;  Laterality: N/A;  . KNEE ARTHROSCOPY Left        Home Medications    Prior to Admission medications   Medication Sig Start Date End Date Taking? Authorizing Provider  amitriptyline (ELAVIL) 50 MG tablet Take 50 mg by mouth at bedtime. 01/07/16   [provider]  ARIPiprazole (ABILIFY) 5 MG tablet Take 5 mg by mouth daily.    [provider]  DULoxetine (CYMBALTA) 60 MG capsule TAKE 1 CAPSULE EVERY DAY 05/16/16   Rice, Resa Miner, MD  gabapentin (NEURONTIN) 300 MG capsule TAKE 3 CAPSULES BY  MOUTH  EVERY MORNING AND  AFTERNOON. TAKE 4 CAPSULES  BY MOUTH AT NIGHT BEFORE  BED 03/10/16   Collier Salina, MD  glucose blood (ACCU-CHEK AVIVA PLUS) test strip Use 3 times daily to check blood sugar. diag code E11.42. Insulin dependent 06/15/16   Rice, Resa Miner, MD  hydrOXYzine (ATARAX/VISTARIL) 10 MG tablet Take 1 tablet (10 mg total) by mouth 3 (three) times daily as needed for itching. Patient taking differently:  Take 10 mg by mouth 3 (three) times daily as needed for itching or anxiety.  03/17/16   Collier Salina, MD  ibuprofen (ADVIL,MOTRIN) 800 MG tablet Take 800 mg by mouth 3 (three) times daily as needed. 05/31/16   [provider]  insulin aspart (NOVOLOG FLEXPEN) 100 UNIT/ML FlexPen Inject 0 to 9 units under the skin with meals 3 times daily. 06/15/16   Molt, Bethany, DO  insulin aspart (NOVOLOG) 100 UNIT/ML injection Inject 0-9 Units into the skin 3 (three) times daily before meals. 06/15/16   Molt, Bethany, DO  lipase/protease/amylase (CREON) 36000 UNITS CPEP capsule TAKE 1 CAPSULE BY MOUTH 3  TIMES DAILY BEFORE MEALS 03/02/16   Rice, Resa Miner, MD  lovastatin (MEVACOR) 40 MG tablet Take 1 tablet (40 mg total) by mouth at bedtime. 03/02/16   Collier Salina, MD  pantoprazole (PROTONIX) 40 MG tablet Take 1 tablet (40 mg total) by mouth daily. 03/02/16   Collier Salina, MD  thiamine (VITAMIN B-1) 100 MG tablet Take 1 tablet (100 mg total) by mouth daily. 06/15/16   Molt, Bethany, DO    Family History Family History  Problem Relation Age of Onset  . Hypertension Sister   . Diabetes Sister   . Heart disease Sister   . Colon cancer Neg Hx   . Stomach cancer Neg Hx   . Anesthesia problems Neg Hx   . Hypotension Neg Hx   . Pseudochol deficiency Neg Hx   . Malignant hyperthermia Neg Hx     Social History Social History  Substance Use Topics  . Smoking status: Current Every Day Smoker    Packs/day: 0.25    Years: 42.00    Types: Cigarettes  . Smokeless tobacco: Never Used     Comment: 5 cigs per day  . Alcohol use 36.0 oz/week    10 Standard drinks or equivalent, 50 Shots of liquor per week     Comment: 06/08/2016 "1/5th liquor maybe 2 days/week"     Allergies   Ace inhibitors; Losartan; and Tylenol [acetaminophen]   Review of Systems Review of Systems  Unable to perform ROS: Mental status change  Allergic/Immunologic: Positive for immunocompromised state.        Diabetic     Physical Exam Updated Vital Signs BP (!) 89/74 (BP Location: Left Arm)   Pulse (!) 113   Temp 98.6 F (37 C) (Oral)   Resp 18   SpO2 97%   Physical Exam  Constitutional:  Chronically ill-appearing. Speech slurred  HENT:  Head: Normocephalic and atraumatic.  Eyes: Conjunctivae are normal. Pupils are equal, round, and reactive to light.  Neck: Neck supple. No tracheal deviation present. No thyromegaly present.  Cardiovascular: Regular rhythm.   No murmur heard. Mildly tachycardic  Pulmonary/Chest: Effort normal and breath sounds normal.  Abdominal: Soft. Bowel sounds are normal. He exhibits no distension. There is no tenderness.  Genitourinary: Penis normal. Rectal exam shows guaiac negative stool.  Genitourinary Comments: Incontinent of stool, wearing diaper. Rectal normal tone. Mccart stool without gross  blood  Musculoskeletal: Normal range of motion. He exhibits no edema or tenderness.  Neurological: He is alert. Coordination normal.  Speech is slurred cranial nerves II through XII grossly intact. Moves all extremities. Commands  Skin: Skin is warm and dry. No rash noted.  Psychiatric: He has a normal mood and affect.  Nursing note and vitals reviewed.    ED Treatments / Results  Labs (all labs ordered are listed, but only abnormal results are displayed) Labs Reviewed  ETHANOL  PROTIME-INR  APTT  CBC  DIFFERENTIAL  COMPREHENSIVE METABOLIC PANEL  RAPID URINE DRUG SCREEN, HOSP PERFORMED  URINALYSIS, ROUTINE W REFLEX MICROSCOPIC  POC OCCULT BLOOD, ED  I-STAT CHEM 8, ED  I-STAT TROPOININ, ED  POC OCCULT BLOOD, ED    EKG  EKG Interpretation  Date/Time:  Sunday Jun 18 2016 17:32:55 EDT Ventricular Rate:  100 PR Interval:    QRS Duration: 94 QT Interval:  351 QTC Calculation: 453 R Axis:   43 Text Interpretation:  Sinus tachycardia Minimal ST elevation, anterior leads No significant change since last tracing Confirmed by Orlie Dakin [phone]) on  06/18/2016 5:56:21 PM      Results for orders placed or performed during the hospital encounter of 06/18/16  Ethanol  Result Value Ref Range   Alcohol, Ethyl (B) <5 <5 mg/dL  Protime-INR  Result Value Ref Range   Prothrombin Time 13.4 11.4 - 15.2 seconds   INR 1.02   APTT  Result Value Ref Range   aPTT 31 24 - 36 seconds  CBC  Result Value Ref Range   WBC 7.1 4.0 - 10.5 K/uL   RBC 4.05 (L) 4.22 - 5.81 MIL/uL   Hemoglobin 12.9 (L) 13.0 - 17.0 g/dL   HCT 37.5 (L) 39.0 - 52.0 %   MCV 92.6 78.0 - 100.0 fL   MCH 31.9 26.0 - 34.0 pg   MCHC 34.4 30.0 - 36.0 g/dL   RDW 14.3 11.5 - 15.5 %   Platelets 204 150 - 400 K/uL  Differential  Result Value Ref Range   Neutrophils Relative % 59 %   Neutro Abs 4.2 1.7 - 7.7 K/uL   Lymphocytes Relative 27 %   Lymphs Abs 1.9 0.7 - 4.0 K/uL   Monocytes Relative 10 %   Monocytes Absolute 0.7 0.1 - 1.0 K/uL   Eosinophils Relative 4 %   Eosinophils Absolute 0.3 0.0 - 0.7 K/uL   Basophils Relative 0 %   Basophils Absolute 0.0 0.0 - 0.1 K/uL  Comprehensive metabolic panel  Result Value Ref Range   Sodium 143 135 - 145 mmol/L   Potassium 3.6 3.5 - 5.1 mmol/L   Chloride 108 101 - 111 mmol/L   CO2 31 22 - 32 mmol/L   Glucose, Bld 116 (H) 65 - 99 mg/dL   BUN 10 6 - 20 mg/dL   Creatinine, Ser 0.93 0.61 - 1.24 mg/dL   Calcium 8.6 (L) 8.9 - 10.3 mg/dL   Total Protein 5.0 (L) 6.5 - 8.1 g/dL   Albumin 2.9 (L) 3.5 - 5.0 g/dL   AST 33 15 - 41 U/L   ALT 26 17 - 63 U/L   Alkaline Phosphatase 82 38 - 126 U/L   Total Bilirubin 0.3 0.3 - 1.2 mg/dL   GFR calc non Af Amer >60 >60 mL/min   GFR calc Af Amer >60 >60 mL/min   Anion gap 4 (L) 5 - 15  POC occult blood, ED  Result Value Ref Range   Fecal Occult Bld  NEGATIVE NEGATIVE  I-stat troponin, ED (not at [facility], [facility])  Result Value Ref Range   Troponin i, poc 0.00 0.00 - 0.08 ng/mL   Comment 3           Ct Head Wo Contrast  Result Date: 06/18/2016 CLINICAL DATA:  Weakness and multiple recent falls  EXAM: CT HEAD WITHOUT CONTRAST TECHNIQUE: Contiguous axial images were obtained from the base of the skull through the vertex without intravenous contrast. COMPARISON:  06/12/2016 FINDINGS: Brain: Mild atrophic changes are noted. Mild chronic white matter ischemic change is seen. No findings to suggest acute hemorrhage, acute infarction or space-occupying mass lesion are seen. Vascular: No hyperdense vessel or unexpected calcification. Skull: Normal. Negative for fracture or focal lesion. Sinuses/Orbits: No acute finding. Other: None. IMPRESSION: Chronic atrophic and ischemic changes without acute abnormality. Electronically Signed   By: Inez Catalina M.D.   On: 06/18/2016 18:16   Ct Head Wo Contrast  Result Date: 06/08/2016 CLINICAL DATA:  Altered mental status EXAM: CT HEAD WITHOUT CONTRAST TECHNIQUE: Contiguous axial images were obtained from the base of the skull through the vertex without intravenous contrast. COMPARISON:  06/05/2016 FINDINGS: Brain: No acute territorial infarction, hemorrhage or intracranial mass is visualized. Periventricular and subcortical white matter hypodensities consistent with small vessel ischemic change. Mild atrophy. Stable ventricle size. Vascular: No hyperdense vessels.  Carotid artery calcifications. Skull: No fracture or suspicious bone lesion. Small fluid in the inferior mastoids. Sinuses/Orbits: Clear paranasal sinuses. No acute orbital abnormality. Other: None IMPRESSION: No CT evidence for acute intracranial abnormality. Moderate small vessel ischemic changes of the white matter along with atrophy. Electronically Signed   By: Donavan Foil M.D.   On: 06/08/2016 23:29   Ct Head Wo Contrast  Result Date: 06/05/2016 CLINICAL DATA:  Confusion since last evening EXAM: CT HEAD WITHOUT CONTRAST TECHNIQUE: Contiguous axial images were obtained from the base of the skull through the vertex without intravenous contrast. COMPARISON:  12/03/2014 FINDINGS: BRAIN: There is sulcal  and ventricular prominence consistent with superficial and central atrophy. No intraparenchymal hemorrhage, mass effect nor midline shift. Periventricular and subcortical white matter hypodensities consistent with chronic small vessel ischemic disease are identified. No acute large vascular territory infarcts. No abnormal extra-axial fluid collections. Basal cisterns are not effaced and midline. VASCULAR: Moderate calcific atherosclerosis of the carotid siphons. SKULL: No skull fracture. No significant scalp soft tissue swelling. SINUSES/ORBITS: The mastoid air-cells are clear. The included paranasal sinuses are well-aerated.The included ocular globes and orbital contents are non-suspicious. OTHER: None. IMPRESSION: Chronic small vessel ischemic disease of periventricular and subcortical white matter. Cerebral atrophy. Electronically Signed   By: Ashley Royalty M.D.   On: 06/05/2016 02:10   Mr Brain Wo Contrast  Result Date: 06/12/2016 CLINICAL DATA:  Initial evaluation for ophthalmoplegia. EXAM: MRI HEAD WITHOUT CONTRAST TECHNIQUE: Multiplanar, multiecho pulse sequences of the brain and surrounding structures were obtained without intravenous contrast. COMPARISON:  Prior CT from 06/08/2016. FINDINGS: Brain: Diffuse prominence of the CSF containing spaces compatible with generalized cerebral atrophy. Patchy and confluent T2/FLAIR hyperintensity within the periventricular and deep white matter both cerebral hemispheres most compatible chronic small vessel ischemic disease, moderate nature. Small remote lacunar infarcts present within the bilateral thalami. No abnormal foci of restricted diffusion to suggest acute or subacute ischemia. Gray-white matter differentiation maintained. No other areas of encephalomalacia to suggest chronic infarction. No acute or chronic intracranial hemorrhage identified on this motion degraded exam. No mass lesion, midline shift or mass effect. Ventricles normal size without evidence for  hydrocephalus. No extra-axial fluid collection. Major dural sinuses are grossly patent. Pituitary gland suprasellar region within normal limits. Midline structures intact and normal. Vascular: Major intracranial vascular flow voids are maintained. Right vertebral artery diminutive/hypoplastic. Skull and upper cervical spine: Craniocervical junction within normal limits. Visualized upper cervical spine unremarkable. Bone marrow signal intensity within normal limits. No scalp soft tissue abnormality. Sinuses/Orbits: Globes and orbital soft tissues demonstrate no acute abnormality. Paranasal sinuses are clear. Small left mastoid effusion noted, of doubtful significance. Inner ear structures grossly normal. Other: None. IMPRESSION: 1. No acute intracranial process identified. 2. Moderate chronic microvascular ischemic disease with small remote bilateral thalamic lacunar infarcts. Electronically Signed   By: Jeannine Boga M.D.   On: 06/12/2016 23:18   Dg Chest Port 1 View  Result Date: 06/08/2016 CLINICAL DATA:  Altered mental status EXAM: PORTABLE CHEST 1 VIEW COMPARISON:  09/02/2015 FINDINGS: The heart size and mediastinal contours are within normal limits. Both lungs are clear. The visualized skeletal structures are unremarkable. IMPRESSION: No active disease. Electronically Signed   By: Donavan Foil M.D.   On: 06/08/2016 22:27    Radiology No results found.  Procedures Procedures (including critical care time)  Medications Ordered in ED Medications  sodium chloride 0.9 % bolus 1,000 mL (not administered)  thiamine (B-1) injection 100 mg (not administered)     Initial Impression / Assessment and Plan / ED Course  I have reviewed the triage vital signs and the nursing notes.  Pertinent labs & imaging results that were available during my care of the patient were reviewed by me and considered in my medical decision making (see chart for details).     Dr. Benjamine Mola got further history from  patient's family that family member is severely disabled and unable to care for patient he's been falling several times at home since his recent discharge from hospital. They're unable to care for him. Dr. Benjamine Mola does note that his speech is markedly more slurred than at baseline. I consulted internal medicine service to arrange for overnight stay. Final Clinical Impressions(s) / ED Diagnoses  Diagnosis #1 multiple falls #2 dysarthria Final diagnoses:  None    New Prescriptions New Prescriptions   No medications on file     Orlie Dakin, MD 06/18/16 2004

## 2016-06-18 NOTE — ED Triage Notes (Signed)
Pt BIB EMS from home for weakness and multiple falls throughout the week. Per EMS no injury to head or LOC and ambulatory on scene; states family requesting pt placement into nursing home. Pt reports pain from pancreatitis; pt mumbles words. Resp e/u; A&Ox4; NAD noted at this time.

## 2016-06-18 NOTE — ED Notes (Signed)
Admitting at bedside 

## 2016-06-19 ENCOUNTER — Encounter (HOSPITAL_COMMUNITY): Payer: Self-pay | Admitting: *Deleted

## 2016-06-19 DIAGNOSIS — F419 Anxiety disorder, unspecified: Secondary | ICD-10-CM

## 2016-06-19 DIAGNOSIS — E512 Wernicke's encephalopathy: Secondary | ICD-10-CM | POA: Diagnosis present

## 2016-06-19 DIAGNOSIS — Z794 Long term (current) use of insulin: Secondary | ICD-10-CM | POA: Diagnosis not present

## 2016-06-19 DIAGNOSIS — R531 Weakness: Secondary | ICD-10-CM

## 2016-06-19 DIAGNOSIS — Z8639 Personal history of other endocrine, nutritional and metabolic disease: Secondary | ICD-10-CM

## 2016-06-19 DIAGNOSIS — Z888 Allergy status to other drugs, medicaments and biological substances status: Secondary | ICD-10-CM

## 2016-06-19 DIAGNOSIS — Z8249 Family history of ischemic heart disease and other diseases of the circulatory system: Secondary | ICD-10-CM

## 2016-06-19 DIAGNOSIS — R296 Repeated falls: Secondary | ICD-10-CM | POA: Diagnosis present

## 2016-06-19 DIAGNOSIS — M25569 Pain in unspecified knee: Secondary | ICD-10-CM

## 2016-06-19 DIAGNOSIS — K8681 Exocrine pancreatic insufficiency: Secondary | ICD-10-CM

## 2016-06-19 DIAGNOSIS — Z886 Allergy status to analgesic agent status: Secondary | ICD-10-CM

## 2016-06-19 DIAGNOSIS — E559 Vitamin D deficiency, unspecified: Secondary | ICD-10-CM | POA: Diagnosis present

## 2016-06-19 DIAGNOSIS — E43 Unspecified severe protein-calorie malnutrition: Secondary | ICD-10-CM | POA: Diagnosis present

## 2016-06-19 DIAGNOSIS — E1151 Type 2 diabetes mellitus with diabetic peripheral angiopathy without gangrene: Secondary | ICD-10-CM

## 2016-06-19 DIAGNOSIS — E118 Type 2 diabetes mellitus with unspecified complications: Secondary | ICD-10-CM | POA: Diagnosis not present

## 2016-06-19 DIAGNOSIS — E1169 Type 2 diabetes mellitus with other specified complication: Secondary | ICD-10-CM | POA: Diagnosis not present

## 2016-06-19 DIAGNOSIS — F209 Schizophrenia, unspecified: Secondary | ICD-10-CM | POA: Diagnosis present

## 2016-06-19 DIAGNOSIS — Z79899 Other long term (current) drug therapy: Secondary | ICD-10-CM | POA: Diagnosis not present

## 2016-06-19 DIAGNOSIS — F17210 Nicotine dependence, cigarettes, uncomplicated: Secondary | ICD-10-CM | POA: Diagnosis not present

## 2016-06-19 DIAGNOSIS — F1021 Alcohol dependence, in remission: Secondary | ICD-10-CM

## 2016-06-19 DIAGNOSIS — E1142 Type 2 diabetes mellitus with diabetic polyneuropathy: Secondary | ICD-10-CM | POA: Diagnosis present

## 2016-06-19 DIAGNOSIS — F319 Bipolar disorder, unspecified: Secondary | ICD-10-CM | POA: Diagnosis present

## 2016-06-19 DIAGNOSIS — K861 Other chronic pancreatitis: Secondary | ICD-10-CM | POA: Diagnosis not present

## 2016-06-19 DIAGNOSIS — Z833 Family history of diabetes mellitus: Secondary | ICD-10-CM

## 2016-06-19 DIAGNOSIS — K219 Gastro-esophageal reflux disease without esophagitis: Secondary | ICD-10-CM | POA: Diagnosis present

## 2016-06-19 DIAGNOSIS — F451 Undifferentiated somatoform disorder: Secondary | ICD-10-CM | POA: Diagnosis not present

## 2016-06-19 DIAGNOSIS — M170 Bilateral primary osteoarthritis of knee: Secondary | ICD-10-CM | POA: Diagnosis present

## 2016-06-19 DIAGNOSIS — R410 Disorientation, unspecified: Secondary | ICD-10-CM | POA: Diagnosis not present

## 2016-06-19 DIAGNOSIS — R338 Other retention of urine: Secondary | ICD-10-CM | POA: Diagnosis not present

## 2016-06-19 DIAGNOSIS — E11649 Type 2 diabetes mellitus with hypoglycemia without coma: Secondary | ICD-10-CM | POA: Diagnosis present

## 2016-06-19 DIAGNOSIS — E785 Hyperlipidemia, unspecified: Secondary | ICD-10-CM | POA: Diagnosis present

## 2016-06-19 DIAGNOSIS — R270 Ataxia, unspecified: Secondary | ICD-10-CM

## 2016-06-19 LAB — VITAMIN D 25 HYDROXY (VIT D DEFICIENCY, FRACTURES): Vit D, 25-Hydroxy: 14.2 ng/mL — ABNORMAL LOW (ref 30.0–100.0)

## 2016-06-19 LAB — CBC
HEMATOCRIT: 37.5 % — AB (ref 39.0–52.0)
HEMOGLOBIN: 12.7 g/dL — AB (ref 13.0–17.0)
MCH: 31.4 pg (ref 26.0–34.0)
MCHC: 33.9 g/dL (ref 30.0–36.0)
MCV: 92.8 fL (ref 78.0–100.0)
Platelets: 189 10*3/uL (ref 150–400)
RBC: 4.04 MIL/uL — AB (ref 4.22–5.81)
RDW: 14.3 % (ref 11.5–15.5)
WBC: 6.6 10*3/uL (ref 4.0–10.5)

## 2016-06-19 LAB — GLUCOSE, CAPILLARY
GLUCOSE-CAPILLARY: 145 mg/dL — AB (ref 65–99)
GLUCOSE-CAPILLARY: 163 mg/dL — AB (ref 65–99)
Glucose-Capillary: 145 mg/dL — ABNORMAL HIGH (ref 65–99)
Glucose-Capillary: 281 mg/dL — ABNORMAL HIGH (ref 65–99)

## 2016-06-19 LAB — URINALYSIS, ROUTINE W REFLEX MICROSCOPIC
Bilirubin Urine: NEGATIVE
GLUCOSE, UA: 150 mg/dL — AB
Hgb urine dipstick: NEGATIVE
Ketones, ur: NEGATIVE mg/dL
Nitrite: NEGATIVE
PH: 6 (ref 5.0–8.0)
Protein, ur: 30 mg/dL — AB
SPECIFIC GRAVITY, URINE: 1.008 (ref 1.005–1.030)

## 2016-06-19 LAB — RAPID URINE DRUG SCREEN, HOSP PERFORMED
Amphetamines: NOT DETECTED
BENZODIAZEPINES: POSITIVE — AB
Barbiturates: NOT DETECTED
COCAINE: NOT DETECTED
OPIATES: NOT DETECTED
TETRAHYDROCANNABINOL: NOT DETECTED

## 2016-06-19 MED ORDER — RISPERIDONE 1 MG PO TBDP
1.0000 mg | ORAL_TABLET | Freq: Once | ORAL | Status: AC
  Administered 2016-06-19: 1 mg via ORAL
  Filled 2016-06-19: qty 1

## 2016-06-19 MED ORDER — ALPRAZOLAM 0.5 MG PO TABS
0.5000 mg | ORAL_TABLET | Freq: Once | ORAL | Status: AC
  Administered 2016-06-19: 0.5 mg via ORAL
  Filled 2016-06-19: qty 1

## 2016-06-19 MED ORDER — ENSURE ENLIVE PO LIQD
237.0000 mL | Freq: Three times a day (TID) | ORAL | Status: DC
  Administered 2016-06-19 – 2016-06-22 (×7): 237 mL via ORAL

## 2016-06-19 MED ORDER — ZIPRASIDONE MESYLATE 20 MG IM SOLR
10.0000 mg | Freq: Once | INTRAMUSCULAR | Status: DC
  Filled 2016-06-19: qty 20

## 2016-06-19 MED ORDER — HALOPERIDOL LACTATE 5 MG/ML IJ SOLN
2.0000 mg | Freq: Once | INTRAMUSCULAR | Status: AC
  Administered 2016-06-19: 2 mg via INTRAVENOUS
  Filled 2016-06-19: qty 1

## 2016-06-19 NOTE — Evaluation (Signed)
Physical Therapy Evaluation Patient Details Name: BLAYDE BACIGALUPI MRN: [id number] DOB: 1955-02-27 Today's Date: 06/19/2016   History of Present Illness  Pt is a 61 y/o male admitted from behavioral health hospital secondary to a fall where he hit his head. Pt previously admitted multiple times secondary to suicidal ideations, intoxication, and AMS as well as halucinations. PMH includes bipolar disorder, alcoholism, chronic alcoholic pancreatitis, DM, and schizophrenia.   Clinical Impression  During today's session, pt exhibited slight cognitive abnormalities such as hallucinations and garbled speech. Furthermore, pt exhibited poor standing balance without the use of a RW. Discussed with pt d/c to a SNF to which he was agreeable to, however, pt may need follow-up to confirm that he is agreeable to this decision. Pt would continue to benefit from skilled PT in order to maximize independence and safety to return home.    Follow Up Recommendations SNF    Equipment Recommendations  Rolling walker with 5" wheels    Recommendations for Other Services       Precautions / Restrictions Precautions Precautions: Fall Precaution Comments: Pt has history of falls. Reports falling ~ 10x within the last year Restrictions Weight Bearing Restrictions: No      Mobility  Bed Mobility       General bed mobility comments: with nurse going to the restroom upon arrival  Transfers Overall transfer level: Needs assistance Equipment used: Rolling walker (2 wheeled) Transfers: Sit to/from Stand Sit to Stand: Min assist Stand pivot transfers: (P) Min assist       General transfer comment: verbal cues for body positioning and safety while using a RW  Ambulation/Gait Ambulation/Gait assistance: Min guard Assistive device: Rolling walker (2 wheeled) Gait Pattern/deviations: Step-through pattern;Decreased stride length;Narrow base of support   Gait velocity interpretation: Below normal speed for  age/gender    Stairs            Wheelchair Mobility    Modified Rankin (Stroke Patients Only)       Balance Overall balance assessment: (P) Needs assistance Sitting-balance support: (P) Bilateral upper extremity supported;Feet supported Sitting balance-Leahy Scale: Fair Sitting balance - Comments: able to maintain sitting balance on EOB and recliner with no assistance Postural control: (P) Posterior lean Standing balance support: Bilateral upper extremity supported;During functional activity Standing balance-Leahy Scale: Poor Standing balance comment: Pt had slight LOB while standing, but recovered himself                             Pertinent Vitals/Pain Pain Assessment: No/denies pain    Home Living Family/patient expects to be discharged to:: Skilled nursing facility Living Arrangements: Other relatives               Additional Comments: (P) Per chart, and pt, he was living with sister and other relatives who were unable to provide adequate assistance     Prior Function Level of Independence: Independent   Gait / Transfers Assistance Needed: (P) Pt was ambulatory with RW, and with reported frequent falls   ADL's / Homemaking Assistance Needed: (P) unsure of pt's level of assist.  He says he was doing his ADLs, but no family present to confirm.  He likely at best, required supervision   Comments: Pt difficult to understand due to garbled speech, but stated he did not previously use an AD to ambulate     Hand Dominance   Dominant Hand: Left    Extremity/Trunk Assessment   Upper Extremity Assessment  Upper Extremity Assessment: Defer to OT evaluation    Lower Extremity Assessment Lower Extremity Assessment: Generalized weakness    Cervical / Trunk Assessment Cervical / Trunk Assessment: (P) Normal  Communication   Communication: Other (comment) (garbled speech)  Cognition Arousal/Alertness: Awake/alert Behavior During Therapy: WFL for  tasks assessed/performed Overall Cognitive Status: Difficult to assess Area of Impairment: Memory;Awareness;Safety/judgement;Problem solving                       Following Commands: Follows one step commands consistently;Follows one step commands with increased time Safety/Judgement: Decreased awareness of safety;Decreased awareness of deficits Awareness: (P) Emergent Problem Solving: Difficulty sequencing;Requires verbal cues General Comments: Pt confused at times during session; pt also experienced hallucinations during today's session. Asked for a spider to be killed that was not there. Also requested a PBJ sandwich from the table that was not there.      General Comments      Exercises     Assessment/Plan    PT Assessment Patient needs continued PT services  PT Problem List Decreased strength;Decreased balance;Decreased coordination;Decreased cognition;Decreased safety awareness       PT Treatment Interventions DME instruction;Gait training;Stair training;Functional mobility training;Therapeutic activities;Therapeutic exercise;Balance training;Neuromuscular re-education;Cognitive remediation    PT Goals (Current goals can be found in the Care Plan section)  Acute Rehab PT Goals Patient Stated Goal: to decrease falls PT Goal Formulation: With patient Time For Goal Achievement: 06/04/17 Potential to Achieve Goals: Fair    Frequency Min 5X/week   Barriers to discharge Decreased caregiver support      Co-evaluation               AM-PAC PT "6 Clicks" Daily Activity  Outcome Measure Difficulty turning over in bed (including adjusting bedclothes, sheets and blankets)?: A Little Difficulty moving from lying on back to sitting on the side of the bed? : A Little Difficulty sitting down on and standing up from a chair with arms (e.g., wheelchair, bedside commode, etc,.)?: A Little Help needed moving to and from a bed to chair (including a wheelchair)?: A  Little Help needed walking in hospital room?: A Little Help needed climbing 3-5 steps with a railing? : A Little 6 Click Score: 18    End of Session Equipment Utilized During Treatment: Gait belt Activity Tolerance: Patient tolerated treatment well Patient left: in chair;with call bell/phone within reach;with chair alarm set Nurse Communication: Mobility status PT Visit Diagnosis: Unsteadiness on feet (R26.81);Other abnormalities of gait and mobility (R26.89);Muscle weakness (generalized) (M62.81);History of falling (Z91.81)    Time: 3785-8850 PT Time Calculation (min) (ACUTE ONLY): 28 min   Charges:   PT Evaluation $PT Eval Low Complexity: 1 Procedure PT Treatments $Gait Training: 8-22 mins   PT G Codes:   PT G-Codes **NOT FOR INPATIENT CLASS** Functional Assessment Tool Used: Clinical judgement Functional Limitation: Mobility: Walking and moving around Mobility: Walking and Moving Around Current Status (Y7741): At least 20 percent but less than 40 percent impaired, limited or restricted Mobility: Walking and Moving Around Goal Status [phone]): 0 percent impaired, limited or restricted    Meda Coffee, SPT [phone]   Eliezer Lofts Gailyn Crook 06/19/2016, 5:01 PM

## 2016-06-19 NOTE — Progress Notes (Signed)
New Admission Note:  Arrival Method: Stretcher from ED Mental Orientation: A&O x2 Telemetry: [address], CCMD notified Assessment: Completed Skin: Assessed with Kathe Becton, RN, check flowsheets IV: R Hand, saline locked Pain: 0/10 Tubes: None Admission: Completed [address] Orientation: Patient has been orientated to the room, unit and the staff. Family: None at bedside  Orders have been reviewed and implemented. Will continue to monitor the patient. Call light has been placed within reach and bed alarm has been activated.   Nena Polio BSN, RN  Phone Number: [phone]

## 2016-06-19 NOTE — Evaluation (Signed)
Occupational Therapy Evaluation Patient Details Name: Michael [name] MRN: [id number] DOB: 08-02-1955 Today's Date: 06/19/2016    History of Present Illness Pt is a 61 y/o male admitted from behavioral health hospital secondary to a fall where he hit his head. Pt previously admitted multiple times secondary to suicidal ideations, intoxication, and AMS as well as halucinations. PMH includes bipolar disorder, alcoholism, chronic alcoholic pancreatitis, DM, and schizophrenia.    Clinical Impression   Pt admitted with above. He demonstrates the below listed deficits and will benefit from continued OT to maximize safety and independence with BADLs.  Pt presents with impaired cognition, poor safety awareness, impaired balance and ? Mild Lt inattention.  He requires min a for ADLs due to impaired balance.  He is at high risk for fall.  Recommend SNF.       Follow Up Recommendations  SNF    Equipment Recommendations  None recommended by OT    Recommendations for Other Services       Precautions / Restrictions Precautions Precautions: Fall Precaution Comments: Pt has history of falls. Reports falling ~ 10x within the last year Restrictions Weight Bearing Restrictions: No      Mobility Bed Mobility Overal bed mobility: Needs Assistance Bed Mobility: Supine to Sit;Sit to Supine     Supine to sit: Supervision Sit to supine: Supervision   General bed mobility comments: with nurse going to the restroom upon arrival  Transfers Overall transfer level: Needs assistance Equipment used: Rolling walker (2 wheeled) Transfers: Sit to/from Stand Sit to Stand: Min assist Stand pivot transfers: Min assist       General transfer comment: verbal cues for body positioning and safety while using a RW    Balance Overall balance assessment: Needs assistance Sitting-balance support: Bilateral upper extremity supported;Feet supported Sitting balance-Leahy Scale: Fair Sitting balance -  Comments: able to maintain sitting balance on EOB and recliner with no assistance Postural control: Posterior lean Standing balance support: Bilateral upper extremity supported;During functional activity Standing balance-Leahy Scale: Poor Standing balance comment: Pt had slight LOB while standing, but recovered himself                           ADL either performed or assessed with clinical judgement   ADL Overall ADL's : Needs assistance/impaired Eating/Feeding: Modified independent;Bed level   Grooming: Wash/dry hands;Min guard;Standing   Upper Body Bathing: Supervision/ safety;Sitting   Lower Body Bathing: Minimal assistance;Sit to/from stand Lower Body Bathing Details (indicate cue type and reason): for balance  Upper Body Dressing : Set up;Supervision/safety;Sitting   Lower Body Dressing: Minimal assistance;Sit to/from stand Lower Body Dressing Details (indicate cue type and reason): for balance  Toilet Transfer: Minimal assistance;Ambulation;Comfort height toilet   Toileting- Clothing Manipulation and Hygiene: Minimal assistance;Sit to/from stand       Functional mobility during ADLs: Minimal assistance;Rolling walker       Vision         Perception Perception Perception Tested?: Yes Perception Deficits: Inattention/neglect Comments: Pt noted to run into the sink several times on the Left.     Praxis      Pertinent Vitals/Pain Pain Assessment: No/denies pain     Hand Dominance Left   Extremity/Trunk Assessment Upper Extremity Assessment Upper Extremity Assessment: Defer to OT evaluation   Lower Extremity Assessment Lower Extremity Assessment: Generalized weakness   Cervical / Trunk Assessment Cervical / Trunk Assessment: Normal   Communication Communication Communication: Other (comment) (garbled speech)   Cognition  Arousal/Alertness: Awake/alert Behavior During Therapy: WFL for tasks assessed/performed Overall Cognitive Status:  Difficult to assess Area of Impairment: Memory;Awareness;Safety/judgement;Problem solving                       Following Commands: Follows one step commands consistently;Follows one step commands with increased time Safety/Judgement: Decreased awareness of safety;Decreased awareness of deficits Awareness: Emergent Problem Solving: Difficulty sequencing;Requires verbal cues General Comments: Pt confused at times during session; pt also experienced hallucinations during today's session. Asked for a spider to be killed that was not there. Also requested a PBJ sandwich from the table that was not there.   General Comments       Exercises     Shoulder Instructions      Home Living Family/patient expects to be discharged to:: Skilled nursing facility Living Arrangements: Other relatives                               Additional Comments: Per chart, and pt, he was living with sister and other relatives who were unable to provide adequate assistance       Prior Functioning/Environment Level of Independence: Independent  Gait / Transfers Assistance Needed: Pt was ambulatory with RW, and with reported frequent falls  ADL's / Homemaking Assistance Needed: unsure of pt's level of assist.  He says he was doing his ADLs, but no family present to confirm.  He likely at best, required supervision    Comments: Pt difficult to understand due to garbled speech, but stated he did not previously use an AD to ambulate        OT Problem List: Decreased activity tolerance;Impaired balance (sitting and/or standing);Decreased cognition;Decreased safety awareness;Impaired vision/perception      OT Treatment/Interventions: Self-care/ADL training;DME and/or AE instruction;Therapeutic activities;Balance training;Cognitive remediation/compensation;Visual/perceptual remediation/compensation;Patient/family education    OT Goals(Current goals can be found in the care plan section) Acute  Rehab OT Goals Patient Stated Goal: to decrease falls OT Goal Formulation: With patient Time For Goal Achievement: 07/03/16 Potential to Achieve Goals: Good ADL Goals Pt Will Perform Grooming: with supervision;standing Pt Will Perform Lower Body Bathing: with supervision;sit to/from stand Pt Will Perform Lower Body Dressing: with supervision;sit to/from stand Pt Will Transfer to Toilet: with supervision;ambulating;regular height toilet;grab bars Pt Will Perform Toileting - Clothing Manipulation and hygiene: with supervision;sit to/from stand  OT Frequency: Min 2X/week   Barriers to D/C: Decreased caregiver support          Co-evaluation              AM-PAC PT "6 Clicks" Daily Activity     Outcome Measure Help from another person eating meals?: None Help from another person taking care of personal grooming?: A Little Help from another person toileting, which includes using toliet, bedpan, or urinal?: A Little Help from another person bathing (including washing, rinsing, drying)?: A Little Help from another person to put on and taking off regular upper body clothing?: A Little Help from another person to put on and taking off regular lower body clothing?: A Little 6 Click Score: 19   End of Session Equipment Utilized During Treatment: Rolling walker;Gait belt Nurse Communication: Mobility status  Activity Tolerance: Patient tolerated treatment well Patient left: in bed;with call bell/phone within reach  OT Visit Diagnosis: Unsteadiness on feet (R26.81);Cognitive communication deficit (R41.841)                Time: 2119-4174 OT Time Calculation (  min): 46 min Charges:  OT General Charges $OT Visit: 1 Procedure OT Evaluation $OT Eval Moderate Complexity: 1 Procedure OT Treatments $Self Care/Home Management : 23-37 mins G-Codes: OT G-codes **NOT FOR INPATIENT CLASS** Functional Limitation: Self care Self Care Current Status (F8101): At least 20 percent but less than 40  percent impaired, limited or restricted Self Care Goal Status (B5102): At least 20 percent but less than 40 percent impaired, limited or restricted   [facility], OTR/L [phone]   Lucille Passy M 06/19/2016, 5:06 PM

## 2016-06-19 NOTE — Progress Notes (Signed)
   Subjective: Michael [name] was seen and evaluated today at bedside. He was lethargic and did not contribute much to the examination. He was agitated overnight and without sleep and received Ativan, Risperidone and finally Haldol with resolution.   Objective:  Vital signs in last 24 hours: Vitals:   06/18/16 1914 06/18/16 2056 06/18/16 2136 06/19/16 1034  BP:  123/74  (!) 153/97  Pulse:  93  73  Resp:  16  16  Temp: 98.6 F (37 C) 97.4 F (36.3 C)  97.4 F (36.3 C)  TempSrc:  Oral  Oral  SpO2:  97%  100%  Height:   5\' 4"  (1.626 m)    General: Chronically-ill appearing AA sleeping in bed. Difficult to arouse. Lethargic.  Neuro: Lethargic. Unable to evaluate.  Cardiovascular: Regular rate and rhythm. No murmur or rub appreciated. Pulmonary: CTA BL on anterior chest. Normal WOB on RA.  Abdomen: Lower mid-abdominal fullness/distention. Abdomen doesn't appear tender to palpation. Condom cath in place. ~29mls urine in bag.  Extremities: Warm and perfused. No peripheral edema appreciated BL LE. Skin: Warm, dry. No cyanosis.   Assessment/Plan:  Principal Problem:   Falls Active Problems:   Protein-calorie malnutrition, severe   Tricompartment osteoarthritis of both knees   Wernicke's encephalopathy  Recurrent Falls: Patient feels these are mechanical falls due to his knee pain. He does have documented tricompartmental OA of BL knees, unsure if patient has received steroid injection however could benefit should Michael [name] be open to that procedure. At time of discharge, 24 hr supervision/assistance was recommended and per discussion with his family, they felt they could provide that for him. In addition, he was unable to make it to his HFU appointment due to lack of transportation. Clearly the patient is unable to receive the supervision he requires at home and he would benefit greatly from SNF.  -Vitamin studies WNL. -PT recommendations, attempted to work with patient however too  lethargic. Will try again later.  -Voltaren gel prn  Hx of Alcohol abuse, ?Wernike Encephalopathy: Has abstained from alcohol since discharge. Ethanol level negative on admission. He reports compliance with thiamine at home. Michael [name] received Ativan, Risperidone and Haldol overnight due to agitation. He has fluctuating AMS and ataxia. Unable to evaluate for ophthalmoplegia this morning secondary to patients fatigue.  -CIWA -- got 1 mg of ativan due to his agitation overnight -Thiamine daily -Please attempt gentle redirection  Brittle Type 2 Diabetes, Chronic Pancreatic Insufficiency: CBGs 115-150 this morning. He appears to be doing well on current sliding scale. He has hx of hypoglycemic seizures and is very sensitive to insulin. -Sliding scale ONLY -Creon  ?Urinary retention: Midline lower abdominal fullness concerning for bladder distention with urinary retention. Looks like STAT in & out cath was performed in ED on admission however no documentation of why. Will do bladder scan, in & out cath if >48mls PVR.  -Bladder scan -In & out cath if >482mls PVR -UA  Bipolar disorder, schizophrenia: recently admitted at [facility] for SI/HI. Denied any ideations or hallucinations upon admission. Continue home Aripiprazole 5 mg, duloxetine 60 mg and amitriptyline 50 mg QHS  Dispo: Anticipated discharge once SNF bed available.   Michael Doster, DO 06/19/2016, 11:40 AM Pager: [phone]

## 2016-06-19 NOTE — Progress Notes (Signed)
Initial Nutrition Assessment  DOCUMENTATION CODES:   Not applicable  INTERVENTION:   -Ensure Enlive po TID, each supplement provides 350 kcal and 20 grams of protein -Recommend Creon be ordered between meals with supplements/snacks prn as needed -New weight pending for this admission   NUTRITION DIAGNOSIS:   Inadequate oral intake related to acute illness, lethargy/confusion as evidenced by meal completion < 25%.  GOAL:   Patient will meet greater than or equal to 90% of their needs   MONITOR:   PO intake, Supplement acceptance, Labs, Weight trends  REASON FOR ASSESSMENT:   Consult Assessment of nutrition requirement/status  ASSESSMENT:   61 yo male admitted with recurrent falls, EtOH abuse on CIWA protocol with chronic pancreatitis with pancreatic insufficiency and now likely Wernicke's encephalopathy Pt with hx of EtOH abuse, bipolar disorder, schizophrenia, chronic pancreatitis    Pt lethargic today after agitation over night, received ativan, haldol and risperidone. Recorded po intake 0% this AM but ate sandwich during the night.   Unable to complete Nutrition-Focused physical exam at this time.   06/09/16 159 lb 9.8 oz (72.4 kg)  05/31/16 150 lb (68 kg)  04/09/16 153 lb (69.4 kg)  03/17/16 153 lb 11.2 oz (69.7 kg)  02/25/16 152 lb 8 oz (69.2 kg)  02/02/16 150 lb 12.8 oz (68.4 kg)  01/14/16 152 lb 14.4 oz (69.4 kg)  11/15/15 153 lb 9.6 oz (69.7 kg)  10/15/15 150 lb 4.8 oz (68.2 kg)  10/13/15 147 lb (66.7 kg)   No significant wt loss per wt encounters but no new weight this admission; will request new wt  Labs: reviewed Meds: creon, MVI, thiamine, folic acid  Diet Order:  Diet regular Room service appropriate? Yes; Fluid consistency: Thin  Skin:  Reviewed, no issues  Last BM:  06/17/16  Height:   Ht Readings from Last 1 Encounters:  06/18/16 5\' 4"  (1.626 m)    Weight:   Wt Readings from Last 1 Encounters:  06/09/16 159 lb 9.8 oz (72.4 kg)     BMI:  There is no height or weight on file to calculate BMI.  Estimated Nutritional Needs:   Kcal:  3953-2023 kcals  Protein:  90-110 g  Fluid:  >/= 1.8 L  EDUCATION NEEDS:   No education needs identified at this time  [place], [place], LDN [phone] Pager  [phone] Weekend/On-Call Pager

## 2016-06-19 NOTE — Care Management Obs Status (Signed)
[place] NOTIFICATION   Patient Details  Name: CAYLE THUNDER MRN: [id number] Date of Birth: 05-03-1955   Medicare Observation Status Notification Given:  Yes    Lisa Milian, Rory Percy, RN 06/19/2016, 1:18 PM

## 2016-06-19 NOTE — Progress Notes (Signed)
OT Cancellation Note  Patient Details Name: CASEN PRYOR MRN: [id number] DOB: 26-Jul-1955   Cancelled Treatment:    Reason Eval/Treat Not Completed: Fatigue/lethargy limiting ability to participate.  Pt sleeping/or with eyes closed.  With max attempts, he will briefly open eyes and look to therapist, but will not sustain eye opening, nor participate.  Will reattempt.   Ringo Sherod [place], OTR/L [phone]   Lucille Passy M 06/19/2016, 11:07 AM

## 2016-06-20 DIAGNOSIS — R410 Disorientation, unspecified: Secondary | ICD-10-CM

## 2016-06-20 DIAGNOSIS — F209 Schizophrenia, unspecified: Secondary | ICD-10-CM

## 2016-06-20 DIAGNOSIS — M170 Bilateral primary osteoarthritis of knee: Secondary | ICD-10-CM

## 2016-06-20 DIAGNOSIS — F451 Undifferentiated somatoform disorder: Secondary | ICD-10-CM

## 2016-06-20 LAB — CBC
HCT: 40 % (ref 39.0–52.0)
HEMOGLOBIN: 13.5 g/dL (ref 13.0–17.0)
MCH: 31.3 pg (ref 26.0–34.0)
MCHC: 33.8 g/dL (ref 30.0–36.0)
MCV: 92.8 fL (ref 78.0–100.0)
PLATELETS: 196 10*3/uL (ref 150–400)
RBC: 4.31 MIL/uL (ref 4.22–5.81)
RDW: 14.1 % (ref 11.5–15.5)
WBC: 8 10*3/uL (ref 4.0–10.5)

## 2016-06-20 LAB — BASIC METABOLIC PANEL
ANION GAP: 8 (ref 5–15)
BUN: 8 mg/dL (ref 6–20)
CALCIUM: 8.9 mg/dL (ref 8.9–10.3)
CO2: 27 mmol/L (ref 22–32)
CREATININE: 0.67 mg/dL (ref 0.61–1.24)
Chloride: 105 mmol/L (ref 101–111)
Glucose, Bld: 166 mg/dL — ABNORMAL HIGH (ref 65–99)
Potassium: 3.7 mmol/L (ref 3.5–5.1)
Sodium: 140 mmol/L (ref 135–145)

## 2016-06-20 LAB — GLUCOSE, CAPILLARY
GLUCOSE-CAPILLARY: 158 mg/dL — AB (ref 65–99)
GLUCOSE-CAPILLARY: 192 mg/dL — AB (ref 65–99)
Glucose-Capillary: 211 mg/dL — ABNORMAL HIGH (ref 65–99)
Glucose-Capillary: 292 mg/dL — ABNORMAL HIGH (ref 65–99)

## 2016-06-20 LAB — FOLATE RBC
FOLATE, HEMOLYSATE: 323.7 ng/mL
Folate, RBC: 845 ng/mL (ref >498)
HEMATOCRIT: 38.3 % (ref 37.5–51.0)

## 2016-06-20 MED ORDER — HALOPERIDOL LACTATE 5 MG/ML IJ SOLN
1.0000 mg | Freq: Once | INTRAMUSCULAR | Status: AC
  Administered 2016-06-20: 1 mg via INTRAVENOUS
  Filled 2016-06-20: qty 1

## 2016-06-20 MED ORDER — VITAMIN B-1 100 MG PO TABS
100.0000 mg | ORAL_TABLET | Freq: Every day | ORAL | Status: DC
  Administered 2016-06-20 – 2016-06-22 (×3): 100 mg via ORAL
  Filled 2016-06-20 (×3): qty 1

## 2016-06-20 NOTE — Progress Notes (Signed)
Haldol 1mg  has seemed to calm Michael [name] some, but he still has remained awake, confused all night and constantly needing redirection. Will continue to monitor.

## 2016-06-20 NOTE — Clinical Social Work Note (Signed)
Clinical Social Work Assessment  Patient Details  Name: Michael [name] MRN: [id number] Date of Birth: March 28, 1955  Date of referral:  06/18/16               Reason for consult:  Facility Placement                Permission sought to share information with:  Family Supports Permission granted to share information::  No (Patient oriented to self only)  Name::     Michael [name] - Sister and Michael [name] - Sister  Agency::     Relationship::  Sisters  Contact Information:  Ms. Michael [name] - [phone] and Ms. Michael [name]  - [phone]  Housing/Transportation Living arrangements for the past 2 months:  [place] of Information:  Other (Comment Required) (Patient uncle Michael [name] and sister Michael [name]) Patient Interpreter Needed:  None Criminal Activity/Legal Involvement Pertinent to Current Situation/Hospitalization:  No - Comment as needed Significant Relationships:  Other Family Members, Siblings (Per uncle, [name]. Patient has a son, but he ([name]) does not know where the son is) Lives with:  Siblings (Patient lives with sister Michael [name]) Do you feel safe going back to the place where you live?  Yes (Patient wants to go home, however uncle explained to patient why he needed ST rehab, and patient agreed, however a few minutes later, patient again said that he wants to go home.) Need for family participation in patient care:  Yes (Comment)  Care giving concerns:  Patient's uncle, Michael [name] came to visit patient and also asked to speak with CSW. [name] is concerned that patient is not receiving the care he needs at his sister's house, as he sleeps on the floor and sister won't stop him from drinking.    Social Worker assessment / plan:  CSW talked with uncle Michael [name] and he is concerned about patient being at his sister's house and he thinks patient's sister his niece) Michael [name] is just letting patient stay to get his money. He is concerned because patient  does not have his wallet with him at the hospital and he is sure Michael [name] has patient's bank card. CSW talked with sister Michael [name] by phone and she is in agreement with ST rehab and is assisting with selecting a facility for patient.   CSW also talked with patient with his uncle present regarding rehab and he expressed wanting to go home. CSW and uncle explained why rehab would be good and patient then agreed, but due to his confusion, patient again expressed that he wants to go home. He is aware that he does not have his wallet which has his bank card and when asked, reported that his sister does not his PIN #.  Employment status:  Disabled (Comment on whether or not currently receiving Disability) Insurance information:  Programmer, applications, Medicaid In [place] [facility]) PT Recommendations:  [place] / Referral to community resources:  [place] ([place] talked with sister by phone regarding facility choices)  Patient/Family's Response to care: No concerns expressed regarding patient's care during hospitalization.  Patient/Family's Understanding of and Emotional Response to Diagnosis, Current Treatment, and Prognosis:  Not discussed.  Emotional Assessment Appearance:  Appears older than stated age Attitude/Demeanor/Rapport:   (Appropriate) Affect (typically observed):  Appropriate, Pleasant Orientation:  Oriented to Self Alcohol / Substance use:  Alcohol Use, Illicit Drugs, Tobacco Use (Patient reported that he smokes cigarettes, drinks alcohol and usmokes marijuana) Psych involvement (Current and /or in  the community):  No (Comment)  Discharge Needs  Concerns to be addressed:  Discharge Planning Concerns Readmission within the last 30 days:  Yes Current discharge risk:  None Barriers to Discharge:  [place], [place], [place] 06/20/2016, 1:05 PM

## 2016-06-20 NOTE — Progress Notes (Addendum)
   Subjective: Mr. Corniel was seen and evaluated today in his recliner at the nurses station. He had no acute complaints today.   He again was agitated, anxious, yelling and was unable to be redirected despite several attempts. Ativan and Xanax failed to relax patient. Haldol 1mg  given which helped.   Objective:  Vital signs in last 24 hours: Vitals:   06/19/16 1034 06/19/16 1724 06/19/16 2150 06/20/16 0543  BP: (!) 153/97 (!) 151/100 138/84 136/79  Pulse: 73 89 74 88  Resp: 16 20 18 16   Temp: 97.4 F (36.3 C) 97.2 F (36.2 C) 97.3 F (36.3 C) 98 F (36.7 C)  TempSrc: Oral Oral Oral Oral  SpO2: 100% 100% 92% 99%  Height:       General: Chronically-ill appearing AA male sitting in recliner. Chatting with nurses. Pleasant and very conversant. No ophthalmoplegia appreciated.  Cardiovascular: Regular rate and rhythm. No murmur or rub appreciated. Pulmonary: CTA BL. Normal WOB on RA.  Abdomen: Soft, NT, ND. +bs Extremities: Warm and perfused. No peripheral edema appreciated BL LE. Skin: Warm, dry. No cyanosis.  Psych: Rambling tangentially. Confabulating  Assessment/Plan:  Principal Problem:   Falls Active Problems:   Protein-calorie malnutrition, severe   Tricompartment osteoarthritis of both knees   Wernicke's encephalopathy   Ataxia  Recurrent Falls: Felt to be multifactorial in setting of severe OA of BL knees, component of deconditioning as well as neurogenic ataxia given his history of heavy alcohol abuse. PT agrees with SNF placement and CSW has been consulted to assist with placement. I suspect he would benefit from placement in a facility with memory care. -Vitamin studies WNL. -SNF placement -Voltaren gel prn  Hx of Alcohol abuse, ?Wernike Encephalopathy: He continues to have fluctuating AMS and ataxia. Ophthalmoplegia appreciated on prior admission resolved.  -CIWA d/c -Thiamine daily -Please attempt gentle redirection  Brittle Type 2 Diabetes, Chronic  Pancreatic Insufficiency: CBGs 115-150 this morning. He appears to be doing well on current sliding scale. He has hx of hypoglycemic seizures and is very sensitive to insulin. -Sliding scale ONLY -Creon  Bipolar disorder, schizophrenia: recently admitted at [facility] for SI/HI. Denied any ideations or hallucinations upon admission. Continue home Aripiprazole 5 mg, duloxetine 60 mg and amitriptyline 50 mg QHS -DC amitriptyline yesterday per prior psych recs 2/2 sedating effects  Dispo: Anticipated discharge once SNF bed available.   Draven Laine, DO 06/20/2016, 8:50 AM Pager: [phone]

## 2016-06-20 NOTE — Progress Notes (Signed)
Pt at nurses station alert and disoriented x 4. Pt agitated, anxious, yelling out difficult to redirect dispite giving xanax 0.5mg  po. Dr. Wynetta Emery notified. Orders received. Will continue to monitor.

## 2016-06-20 NOTE — Progress Notes (Signed)
Internal Medicine Attending:   I saw and examined the patient. I reviewed the resident's note and I agree with the resident's findings and plan as documented in the resident's note.  Patient has no new complaints today. He remains confused and was agitated overnight requiring Haldol. Patient was admitted with recurrent falls in the setting of possible Wernicke encephalopathy as well as severe OA of both his knees and his history of heavy alcohol abuse. Patient will need placement in SNF per PT eval. Continue with thiamine daily. He continues to have fluctuating mental status as well as possible ataxia on ambulation. His ophthalmoplegia noted on prior admission appears to have resolved. No further workup for now. Patient will be discharged to SNF once bed available.

## 2016-06-20 NOTE — NC FL2 (Signed)
[place] LEVEL OF CARE SCREENING TOOL     IDENTIFICATION  Patient Name: Michael [name] Birthdate: 1955/02/22 Sex: male Admission Date (Current Location): 06/18/2016  [facility] [place] Number:  Herbalist and Address:  [facility] [place], [place] [address], [place], [place] [address]      Provider Number: [id number]  Attending Physician Name and Address:  Aldine Contes, MD  Relative Name and Phone Number:  Marlowe, Lawes  [phone] (mobile) and [name],[name]-Sister, [phone] (mobile)      Current Level of Care: Hospital Recommended Level of Care: [place] Prior Approval Number:    Date Approved/Denied:   PASRR Number:  (Submitted for PASRR on 06/20/16 - MUST WU#[id number])  Discharge Plan: SNF    Current Diagnoses: Patient Active Problem List   Diagnosis Date Noted  . Ataxia 06/19/2016  . Falls 06/18/2016  . Wernicke's encephalopathy   . Alcohol abuse   . Delirium 06/09/2016  . Altered mental status 06/09/2016  . Alcohol withdrawal ([place]) 06/05/2016  . Bipolar disorder ([place]) 06/01/2016  . Diabetes mellitus due to underlying condition with ketoacidosis without coma, with long-term current use of insulin ([place]) 02/04/2016  . Dry skin 01/18/2016  . Peripheral vascular disease ([place]) 11/15/2015  . Tobacco abuse 09/02/2015  . Tricompartment osteoarthritis of both knees 06/15/2015  . Chronic abdominal pain 03/14/2015  . Vitamin D deficiency 03/12/2015  . Hyperlipidemia 02/17/2015  . Fall 12/24/2014  . Protein-calorie malnutrition, severe 11/18/2014  . Alcohol use disorder ([place])   . Cervical arthritis ([place]) 01/01/2014  . Health care maintenance 08/25/2011  . Chronic alcoholic pancreatitis ([place]) 04/20/2011  . Type 2 diabetes mellitus with peripheral neuropathy ([place]) 12/09/2010  . HTN (hypertension) 12/09/2010    Orientation RESPIRATION BLADDER Height & Weight     Self  Normal Continent (Patient  has external urinary catheter) Weight:   Height:  5\' 4"  (162.6 cm)  BEHAVIORAL SYMPTOMS/MOOD NEUROLOGICAL BOWEL NUTRITION STATUS    Convulsions/Seizures (History of seizures) Continent Diet (Regular)  AMBULATORY STATUS COMMUNICATION OF NEEDS Skin   Limited Assist Verbally Skin abrasions (Left elbow treated with foam dressing)                       Personal Care Assistance Level of Assistance  Bathing, Feeding, Dressing Bathing Assistance: Limited assistance (Upper body supervision/Lower body min assist) Feeding assistance: Limited assistance (Modified independent per OT) Dressing Assistance: Limited assistance (Upper body supervision/Lower body min assist)     Functional Limitations Info  Sight, Hearing, Speech Sight Info: Adequate Hearing Info: Adequate Speech Info: Impaired (Slurred speech)    SPECIAL CARE FACTORS FREQUENCY  PT (By licensed PT), OT (By licensed OT)     PT Frequency: Evaluated 5/28 and a minimum of 5X per week therapy recommended OT Frequency: Evaluated 5/28 and a minimum of 2X per week therapy recommended            Contractures Contractures Info: Not present    Additional Factors Info  Code Status, Allergies Code Status Info: Full Allergies Info: Ace Inhibitors, Losartan, Tylenol           Current Medications (06/20/2016):  This is the current hospital active medication list Current Facility-Administered Medications  Medication Dose Route Frequency Provider Last Rate Last Dose  . 0.9 %  sodium chloride infusion   Intravenous Continuous Norman Herrlich, MD 100 mL/hr at 06/19/16 1743    . ARIPiprazole (ABILIFY) tablet 5 mg  5 mg Oral Daily  Norman Herrlich, MD   5 mg at 06/20/16 1102  . diclofenac sodium (VOLTAREN) 1 % transdermal gel 2 g  2 g Topical TID AC & HS Aldine Contes, MD   2 g at 06/20/16 0902  . DULoxetine (CYMBALTA) DR capsule 60 mg  60 mg Oral Daily Norman Herrlich, MD   60 mg at 06/20/16 1103  . enoxaparin (LOVENOX) injection 40  mg  40 mg Subcutaneous Daily Norman Herrlich, MD   40 mg at 06/19/16 1020  . feeding supplement (ENSURE ENLIVE) (ENSURE ENLIVE) liquid 237 mL  237 mL Oral TID BM Bartholomew Crews, MD   237 mL at 06/20/16 1105  . folic acid (FOLVITE) tablet 1 mg  1 mg Oral Daily Norman Herrlich, MD   1 mg at 06/20/16 1102  . ibuprofen (ADVIL,MOTRIN) tablet 800 mg  800 mg Oral TID PRN Norman Herrlich, MD      . insulin aspart (novoLOG) injection 0-5 Units  0-5 Units Subcutaneous QHS Norman Herrlich, MD      . insulin aspart (novoLOG) injection 0-9 Units  0-9 Units Subcutaneous TID WC Norman Herrlich, MD   2 Units at 06/20/16 0901  . lipase/protease/amylase (CREON) capsule 36,000 Units  36,000 Units Oral TID AC Norman Herrlich, MD   36,000 Units at 06/20/16 0901  . multivitamin with minerals tablet 1 tablet  1 tablet Oral Daily Norman Herrlich, MD   1 tablet at 06/20/16 1102  . pantoprazole (PROTONIX) EC tablet 40 mg  40 mg Oral Daily Norman Herrlich, MD   40 mg at 06/20/16 1102  . pravastatin (PRAVACHOL) tablet 10 mg  10 mg Oral q1800 Norman Herrlich, MD   10 mg at 06/19/16 1742  . thiamine (VITAMIN B-1) tablet 100 mg  100 mg Oral Daily Alvira Philips, [place]   100 mg at 06/20/16 1103     Discharge Medications: Please see discharge summary for a list of discharge medications.  Relevant Imaging Results:  Relevant Lab Results:   Additional Information ss#[id number]  Sable Feil, LCSW

## 2016-06-20 NOTE — Progress Notes (Signed)
Paged about Michael [name] acting agitated, anxious, yelling out that he needs to go home, trying to get up repeatedly. 1:1 redirection and redirection with him at [employer], Ativan, Xanax have failed to calm him. Ordered Haldol 1 mg IV x1 at 1245 AM

## 2016-06-20 NOTE — Clinical Social Work Note (Signed)
Clinicals faxed to [facility] and contact Eben Burow contacted and message left regarding need for insurance authorization for [facility]. PASARR number received: [id number] E, effective 5/29 - 07/20/16. CSW will follow-up with [employer] on 5/30.  Jessenia Filippone Givens, MSW, LCSW Licensed Clinical Social Worker [place] [phone]

## 2016-06-20 NOTE — Progress Notes (Signed)
Physical Therapy Treatment Patient Details Name: Michael [name] MRN: [id number] DOB: Apr 03, 1955 Today's Date: 06/20/2016    History of Present Illness Pt is a 61 y/o male admitted from behavioral health hospital secondary to a fall where he hit his head. Pt previously admitted multiple times secondary to suicidal ideations, intoxication, and AMS as well as halucinations. PMH includes bipolar disorder, alcoholism, chronic alcoholic pancreatitis, DM, and schizophrenia.     PT Comments    Continuing work on functional mobility and activity tolerance;  Michael [name] was sitting in recliner at nurse's station and said he needed to go to bathroom, so assisted him in walking back to his room and toileting; very distractible throughout session, and talked a lot throughout session (at times difficult to decipher what he is saying;  After walking to room , using bathroom, and then walking back to nurse's station, was calmer in recliner  Follow Up Recommendations  SNF     Equipment Recommendations  Rolling walker with 5" wheels    Recommendations for Other Services       Precautions / Restrictions Precautions Precautions: Fall Precaution Comments: Pt has history of falls. Reports falling ~ 10x within the last year    Mobility  Bed Mobility                  Transfers Overall transfer level: Needs assistance Equipment used: Rolling walker (2 wheeled) Transfers: Sit to/from Stand Sit to Stand: Min assist;Mod assist         General transfer comment: verbal cues for body positioning and safety while using a RW; min assist to rise from recliner pushing off of armrests; mod assist to rise form lower toilet  Ambulation/Gait Ambulation/Gait assistance: Min guard Ambulation Distance (Feet): 60 Feet (x2) Assistive device: Rolling walker (2 wheeled) Gait Pattern/deviations: Step-through pattern;Decreased stride length;Narrow base of support     General Gait Details: multimodal cues for  posture and mod vc for increased bilat step length; assist to manage RW and vc for safe proximity   Stairs            Wheelchair Mobility    Modified Rankin (Stroke Patients Only)       Balance     Sitting balance-Leahy Scale: Fair       Standing balance-Leahy Scale: Poor                              Cognition Arousal/Alertness: Awake/alert Behavior During Therapy: WFL for tasks assessed/performed Overall Cognitive Status: Difficult to assess Area of Impairment: Memory;Awareness;Safety/judgement;Problem solving                     Memory: Decreased short-term memory Following Commands: Follows one step commands consistently;Follows one step commands with increased time Safety/Judgement: Decreased awareness of safety;Decreased awareness of deficits Awareness: Emergent Problem Solving: Difficulty sequencing;Requires verbal cues        Exercises      General Comments        Pertinent Vitals/Pain Pain Assessment: No/denies pain    Home Living                      Prior Function            PT Goals (current goals can now be found in the care plan section) Acute Rehab PT Goals Patient Stated Goal: to decrease falls PT Goal Formulation: With patient Time For Goal Achievement: 07/04/16 Potential  to Achieve Goals: Fair Progress towards PT goals: Progressing toward goals    Frequency    Min 2X/week      PT Plan Current plan remains appropriate;Frequency needs to be updated    Co-evaluation              AM-PAC PT "6 Clicks" Daily Activity  Outcome Measure  Difficulty turning over in bed (including adjusting bedclothes, sheets and blankets)?: A Little Difficulty moving from lying on back to sitting on the side of the bed? : A Little Difficulty sitting down on and standing up from a chair with arms (e.g., wheelchair, bedside commode, etc,.)?: A Little Help needed moving to and from a bed to chair (including a  wheelchair)?: A Little Help needed walking in hospital room?: A Lot   6 Click Score: 14    End of Session Equipment Utilized During Treatment: Gait belt Activity Tolerance: Patient tolerated treatment well Patient left: in chair;with call bell/phone within reach;with chair alarm set;Other (comment) (REcliner placed at nurse's station) Nurse Communication: Mobility status PT Visit Diagnosis: Unsteadiness on feet (R26.81);Other abnormalities of gait and mobility (R26.89);Muscle weakness (generalized) (M62.81);History of falling (Z91.81)     Time: 4656-8127 PT Time Calculation (min) (ACUTE ONLY): 26 min  Charges:  $Gait Training: 8-22 mins $Therapeutic Activity: 8-22 mins                    G Codes:       Roney Marion, PT  Acute Rehabilitation Services Pager [phone] Office [place] 06/20/2016, 4:29 PM

## 2016-06-21 LAB — GLUCOSE, CAPILLARY
GLUCOSE-CAPILLARY: 276 mg/dL — AB (ref 65–99)
GLUCOSE-CAPILLARY: 170 mg/dL — AB (ref 65–99)
GLUCOSE-CAPILLARY: 392 mg/dL — AB (ref 65–99)
Glucose-Capillary: 301 mg/dL — ABNORMAL HIGH (ref 65–99)

## 2016-06-21 LAB — VITAMIN B1: Vitamin B1 (Thiamine): 192.5 nmol/L (ref 66.5–200.0)

## 2016-06-21 MED ORDER — VITAMIN D (ERGOCALCIFEROL) 1.25 MG (50000 UNIT) PO CAPS
50000.0000 [IU] | ORAL_CAPSULE | ORAL | Status: DC
Start: 2016-06-21 — End: 2016-06-22
  Administered 2016-06-21: 50000 [IU] via ORAL
  Filled 2016-06-21: qty 1

## 2016-06-21 MED ORDER — ALPRAZOLAM 0.5 MG PO TABS
1.0000 mg | ORAL_TABLET | Freq: Once | ORAL | Status: AC
  Administered 2016-06-21: 1 mg via ORAL
  Filled 2016-06-21: qty 2

## 2016-06-21 NOTE — Progress Notes (Signed)
Internal Medicine Attending:   I saw and examined the patient. I reviewed the resident's note and I agree with the resident's findings and plan as documented in the resident's note.  Patient remains confused but has no new complaints today. Patient was admitted with recurrent falls likely secondary to his confusion as well as OA of his bilateral knees and possible ataxia (from recently diagnosed possible Wernicke's encephalopathy). Patient continues to be confused and may have a permanent cognitive impairment secondary to heavy alcohol use. Continue thiamine daily. PT follow-up and recommendations appreciated. Patient will require placement in SNF. No further workup for now. Patient stable for discharge to SNF once bed available.

## 2016-06-21 NOTE — Consult Note (Signed)
   [facility] CM Inpatient Consult   06/21/2016  Michael [name] 1955-04-24 [id number]   Chart reviewed for 3 admissions and re-admissions in the past 6 months in the [place].  Chart review reveals the patient is being recommended for a skilled nursing facility stay per social worker notes.  Patient remains confused and currently being worked up for a skilled nursing facility for discharge. Will follow for disposition and assess for needs, as appropriate.  For questions, please contact:  Natividad Brood, RN BSN [facility] Liaison  [phone] business mobile phone Toll free office [phone]

## 2016-06-21 NOTE — Progress Notes (Signed)
   Subjective: [name] was seen and evaluated today in his recliner at the nurses station. No acute complaints today although continues to remain confused with confabulation. Nursing reports he did not sleep overnight.  Objective:  Vital signs in last 24 hours: Vitals:   06/20/16 0543 06/20/16 1010 06/20/16 1832 06/21/16 0603  BP: 136/79 132/80  (!) 154/88  Pulse: 88 82  92  Resp: 16 18  18   Temp: 98 F (36.7 C) 98.6 F (37 C)  97.7 F (36.5 C)  TempSrc: Oral Oral  Oral  SpO2: 99% 100%  99%  Weight:    153 lb 4.8 oz (69.5 kg)  Height:   5\' 5"  (1.651 m)    General: Chronically-ill appearing AA male sitting in recliner. Pleasant and very conversant. No ophthalmoplegia appreciated. Cardiovascular: RRR. No murmur or rub appreciated. Pulmonary: CTA BL. Normal WOB on RA.  Abdomen: Soft, not tender nor distended. +bs Extremities: Warm and perfused. No LE edema. Skin: Warm, dry. No cyanosis.  Psych: Rambling tangentially. Confabulating  Assessment/Plan:  Principal Problem:   Falls Active Problems:   Protein-calorie malnutrition, severe   Tricompartment osteoarthritis of both knees   Wernicke's encephalopathy   Ataxia  Recurrent Falls: Multifactorial in this patient with severe OA of BL knees, component of deconditioning as well as neurogenic ataxia given his history of heavy alcohol abuse. CSW working SNF placement and we greatly appreciate their assistance. I suspect he would benefit from placement in a facility with memory care. -SNF placement -Voltaren gel prn  AMS, Hx of Alcohol abuse, ?Wernike Encephalopathy: Confabulation, fluctuating AMS and ataxia. Ophthalmoplegia appreciated on prior admission continues to remain resolved. He appears to be sundowning.  -CIWA d/c -Thiamine daily -Please continue redirection  Brittle Type 2 Diabetes, Chronic Pancreatic Insufficiency: Doing well on current sliding scale. He has hx of hypoglycemic seizures and is very sensitive to  insulin. -Sliding scale ONLY -Creon  Bipolar disorder, schizophrenia: recently admitted at [facility] for SI/HI and is without these ideations this admission. Continue home Aripiprazole 5 mg, duloxetine 60 mg and amitriptyline 50 mg QHS  Dispo: Anticipated discharge once SNF bed available.   [place], DO 06/21/2016, 10:25 AM Pager: [phone]

## 2016-06-21 NOTE — Discharge Summary (Signed)
Name: Michael [name] MRN: [id number] DOB: 08/13/1955 61 y.o. PCP: Collier Salina, MD  Date of Admission: 06/18/2016  3:51 PM Date of Discharge: 06/21/2016 Attending Physician: Aldine Contes, MD  Discharge Diagnosis: 1. Recurrent Falls, multifactorial 2. Alcohol-induced neurocognitive disorder 3. Brittle type 2 diabetes 4. Bipolar disorder, Schizophrenia  Principal Problem:   Falls Active Problems:   Protein-calorie malnutrition, severe   Tricompartment osteoarthritis of both knees   Wernicke's encephalopathy   Ataxia   Discharge Medications: Allergies as of 06/22/2016      Reactions   Ace Inhibitors Swelling, Other (See Comments)   Angioedema - unquestionable   Losartan Swelling, Other (See Comments)   Pt presented with unquestionable angioedema on ACEIs (Angiotensin-converting enzyme (ACE) inhibitors) so aviod ARBs (Angiotensin receptor blockers), as well   Tylenol [acetaminophen] Other (See Comments)   "cannot have this" (contraindicated)      Medication List    STOP taking these medications   amitriptyline 50 MG tablet Commonly known as:  ELAVIL   insulin aspart 100 UNIT/ML FlexPen Commonly known as:  NOVOLOG FLEXPEN Replaced by:  insulin aspart 100 UNIT/ML injection You also have another medication with the same name that you need to continue taking as instructed.   lovastatin 40 MG tablet Commonly known as:  MEVACOR Replaced by:  pravastatin 10 MG tablet     TAKE these medications   ARIPiprazole 5 MG tablet Commonly known as:  ABILIFY Take 5 mg by mouth daily.   diclofenac sodium 1 % Gel Commonly known as:  VOLTAREN Apply 2 g topically 4 (four) times daily -  before meals and at bedtime.   DULoxetine 60 MG capsule Commonly known as:  CYMBALTA TAKE 1 CAPSULE EVERY DAY   feeding supplement (ENSURE ENLIVE) Liqd Take 237 mLs by mouth 3 (three) times daily between meals.   folic acid 1 MG tablet Commonly known as:  FOLVITE Take 1 tablet (1  mg total) by mouth daily. Start taking on:  06/23/2016   gabapentin 300 MG capsule Commonly known as:  NEURONTIN TAKE 3 CAPSULES BY MOUTH  EVERY MORNING AND  AFTERNOON. TAKE 4 CAPSULES  BY MOUTH AT NIGHT BEFORE  BED   glucose blood test strip Commonly known as:  ACCU-CHEK AVIVA PLUS Use 3 times daily to check blood sugar. diag code E11.42. Insulin dependent   hydrOXYzine 10 MG tablet Commonly known as:  ATARAX/VISTARIL Take 1 tablet (10 mg total) by mouth 3 (three) times daily as needed for itching.   ibuprofen 800 MG tablet Commonly known as:  ADVIL,MOTRIN Take 800 mg by mouth 3 (three) times daily as needed.   insulin aspart 100 UNIT/ML injection Commonly known as:  novoLOG Inject 0-5 Units into the skin at bedtime. What changed:  how much to take  when to take this  Another medication with the same name was removed. Continue taking this medication, and follow the directions you see here.   insulin aspart 100 UNIT/ML injection Commonly known as:  novoLOG Inject 0-9 Units into the skin 3 (three) times daily with meals. What changed:  You were already taking a medication with the same name, and this prescription was added. Make sure you understand how and when to take each. Replaces:  insulin aspart 100 UNIT/ML FlexPen   lipase/protease/amylase 36000 UNITS Cpep capsule Commonly known as:  CREON TAKE 1 CAPSULE BY MOUTH 3  TIMES DAILY BEFORE MEALS   multivitamin with minerals Tabs tablet Take 1 tablet by mouth daily. Start taking on:  06/23/2016   pantoprazole 40 MG tablet Commonly known as:  PROTONIX Take 1 tablet (40 mg total) by mouth daily.   pravastatin 10 MG tablet Commonly known as:  PRAVACHOL Take 1 tablet (10 mg total) by mouth daily at 6 PM. Replaces:  lovastatin 40 MG tablet   thiamine 100 MG tablet Commonly known as:  VITAMIN B-1 Take 1 tablet (100 mg total) by mouth daily.   Vitamin D (Ergocalciferol) 50000 units Caps capsule Commonly known as:   DRISDOL Take 1 capsule (50,000 Units total) by mouth every 7 (seven) days. Start taking on:  06/28/2016       Disposition and follow-up:   Michael [name] was discharged from [facility] in stable condition.  At the hospital follow up visit please address:  1.  Recurrent falls, Deconditioning, Ataxia, OA BL Knees: Please ensure patient is working with physical therapy to regain strength, balance and endurance. He has tricompartmental disease of BL knees which seems to be responding OK to ibuprofen and topical voltaren gel. Could consider Meloxicam or steroid injection in future.   Brittle Type 2 Diabetes, tendency towards hypoglycemic seizures: Historically he has experienced hypoglycemia with minimal basal insulin. He is currently doing well with sensitive sliding scale only with CBG goal ~200. Would not recommend future basal insulin.   Alcohol-Related Neurocognitive Disorder: Patient often confabulates and rambles tangentially. He has had extensive work-up for other etiology however imaging, laboratory and psychiatric evaluation unrevealing for another cause. Please attempt redirection in this patient. It is not unusual for him to have waxing and waning confusion. Could try Ativan, risperidone or haldol (haldol as last resort).   2.  Labs / imaging needed at time of follow-up: none.  3.  Pending labs/ test needing follow-up: none   Hospital Course by problem list: Principal Problem:   Falls Active Problems:   Protein-calorie malnutrition, severe   Tricompartment osteoarthritis of both knees   Wernicke's encephalopathy   Ataxia   1. Recurrent Falls, Ataxia Michael [name] is a 61 year old male who presented 5/27 from home for evaluation of frequent falls and lack of necessary supervision/asssitance. Medical history significant for history of chronic alcohol abuse, now with alcohol-related neurocognitive disorder. He was admitted recently several times, initially for  homicidal ideations and delirium tremens followed by confusion and ophthalmoplegia felt to be secondary to wernike encephalopathy. His falls are felt to be multifactorial in a nature as he has severe BL tricompartmental disease/OA of knees, with components of chronic deconditioning and neurologic complications of chronic alcohol abuse. PT/OT recommended SNF placement for continued rehabilitation and supervision. He was discharged to [facility].  2. Neurocognitive Disorder secondary to chronic alcohol abuse Michael [name] has experienced waxing and waning AMS as a consequence of his prolonged alcohol abuse. He has had many recent CT's and even MRI of his brain without apparent organic cause. He experiences confabulation and disorientation occasionally however usually responds well to re-direction. He does occasionally become agitated. He did occasionally require low-dose  Ativan, Rispiridone or Haldol with good response. He was not discharged with any of these medications PRN however could consider adding if needed. He would benefit from placement in a memory care facility long-term.  3. Extremely Brittle Type 2 Diabetes secondary to Chronic Pancreatic Insufficiency, Diabetic Neuropathy Michael [name] is a brittle diabetic with tendency towards hypoglycemic seizures. He is currently and historically has been well controlled with sensitive sliding scale insulin. Addition of even low-dose basal has resulted in hypoglycemic episodes  and would recommend sliding scale only for this patient. He also takes creon with meals for pancreatic insufficiency. He takes gabapentin for neuropathy.   4. Bipolar disorder, schizophrenia  He has had a recent [facility] hospitalization for suicidal/homicidal ideations and hallucinations. These have since resolved and he appears to be stable on Aripiprazole 5mg  daily and Duloxetine 60 mg daily.   5. Vitamin D Deficiency Vitamin D level 14 on admission. Being replaced with 50,000 units Q  weekly x 8 weeks.   6. Hyperlipidemia Patient takes pravastatin 10 mg daily in the evening.  Discharge Vitals:   BP (!) 154/88 (BP Location: Right Arm)   Pulse 92   Temp 97.7 F (36.5 C) (Oral)   Resp 18   Ht 5\' 5"  (1.651 m)   Wt 153 lb 4.8 oz (69.5 kg)   SpO2 99%   BMI 25.51 kg/m   Pertinent Labs, Studies, and Procedures:  CT head without contrast: Chronic atrophic and ischemic changes without acute abnormality.  MRI: No acute intracranial process. Moderate chronic microvascular ischemic disease with small remote bilateral thalamic lacunar infarctions Folate: 323 Folate, RBC: 845 B12:  UA: Without infection CBC: Without leukocytosis or anemia CMET: WNL except for albumin of 2.9 Vitamin B1: 192 Vitamin d: 14 Vitamin B12: 985  Signed: Vergia Chea, DO 06/21/2016, 10:44 AM   Pager: [phone]

## 2016-06-21 NOTE — Progress Notes (Signed)
Occupational Therapy Treatment Patient Details Name: Michael [name] MRN: [id number] DOB: Dec 23, 1955 Today's Date: 06/21/2016    History of present illness Pt is a 61 y/o male admitted [facility] behavioral [facility] hospital secondary to a fall where he hit his head. Pt previously admitted multiple times secondary to suicidal ideations, intoxication, and AMS as well as halucinations. PMH includes bipolar disorder, alcoholism, chronic alcoholic pancreatitis, DM, and schizophrenia.    OT comments  Pt requires min assist for functional mobility due to LOB with turns and decreased attention during mobility and functional tasks. Pt required min guard assist for grooming tasks x2 standing at the sink. Pt requires cues throughout mobility/activity for attention, sequencing, and safety but he is able to identify balance deficits and reports he feels more steady with use of RW. D/c plan remains appropriate. Will continue to follow acutely.   Follow Up Recommendations  SNF    Equipment Recommendations  None recommended by OT    Recommendations for Other Services      Precautions / Restrictions Precautions Precautions: Fall Precaution Comments: Pt has history of falls. Reports falling ~ 10x within the last year Restrictions Weight Bearing Restrictions: No       Mobility Bed Mobility Overal bed mobility: Needs Assistance Bed Mobility: Sit to Supine       Sit to supine: Supervision   General bed mobility comments: Supervision for safety. HOB flat with use of bed rails to boost up to [facility].  Transfers Overall transfer level: Needs assistance Equipment used: Rolling walker (2 wheeled) Transfers: Sit to/from Stand Sit to Stand: Min guard         General transfer comment: Min guard for safety with sit to stand from chair x1. Cues for hand placement with RW.    Balance Overall balance assessment: Needs assistance Sitting-balance support: Feet supported;No upper extremity supported Sitting  balance-Leahy Scale: Good     Standing balance support: No upper extremity supported;During functional activity Standing balance-Leahy Scale: Fair Standing balance comment: Able to stand at sink and brush teeth without UE support and min guard for safety                           ADL either performed or assessed with clinical judgement   ADL Overall ADL's : Needs assistance/impaired     Grooming: Min guard;Standing;Wash/dry face;Oral care;Cueing for sequencing Grooming Details (indicate cue type and reason): Cues for sequencing. Pt attempting to use body wash for oral care; with verbal cues able to self correct and finish task.                 Toilet Transfer: Minimal assistance;Ambulation;RW Toilet Transfer Details (indicate cue type and reason): Simulated by sit to stand from chair with functional mobility. Min assist for balance. Pt tends to lose balance with turns; min assist to correct         Functional mobility during ADLs: Minimal assistance;Rolling walker General ADL Comments: Pt requires verbal cues for attention to task; easily distractable during mobility and functional activities. Pt attempting to use soap for oral care but able to self correct use of toothbrush/toothpaste with min verbal cues.     Vision       Perception     Praxis      Cognition Arousal/Alertness: Awake/alert Behavior During Therapy: WFL for tasks assessed/performed Overall Cognitive Status: Difficult to assess Area of Impairment: Memory;Attention;Following commands;Problem solving  Current Attention Level: Focused Memory: Decreased short-term memory Following Commands: Follows one step commands consistently;Follows one step commands with increased time Safety/Judgement: Decreased awareness of safety;Decreased awareness of deficits Awareness: Emergent Problem Solving: Difficulty sequencing;Requires verbal cues General Comments: poor attention but  able to complete functional task with cues. Can identify balance deficits when cued and reports he feels steadier when he uses RW.        Exercises     Shoulder Instructions       General Comments      Pertinent Vitals/ Pain       Pain Assessment: No/denies pain  Home Living                                          Prior Functioning/Environment              Frequency  Min 2X/week        Progress Toward Goals  OT Goals(current goals can now be found in the care plan section)  Progress towards OT goals: Progressing toward goals  Acute Rehab OT Goals Patient Stated Goal: none stated OT Goal Formulation: With patient  Plan Discharge plan remains appropriate    Co-evaluation                 AM-PAC PT "6 Clicks" Daily Activity     Outcome Measure   Help from another person eating meals?: None Help from another person taking care of personal grooming?: A Little Help from another person toileting, which includes using toliet, bedpan, or urinal?: A Little Help from another person bathing (including washing, rinsing, drying)?: A Little Help from another person to put on and taking off regular upper body clothing?: A Little Help from another person to put on and taking off regular lower body clothing?: A Little 6 Click Score: 19    End of Session Equipment Utilized During Treatment: Rolling walker;Gait belt  OT Visit Diagnosis: Unsteadiness on feet (R26.81);Cognitive communication deficit (R41.841)   Activity Tolerance Patient tolerated treatment well   Patient Left in bed;with call bell/phone within reach;with nursing/sitter in room   Nurse Communication          Time: 1450-1507 OT Time Calculation (min): 17 min  Charges: OT General Charges $OT Visit: 1 Procedure OT Treatments $Self Care/Home Management : 8-22 mins  Taler Kushner A. Ulice Brilliant, M.S., OTR/L Pager: [place] 06/21/2016, 3:22 PM

## 2016-06-22 ENCOUNTER — Ambulatory Visit: Payer: Medicare HMO

## 2016-06-22 DIAGNOSIS — F451 Undifferentiated somatoform disorder: Secondary | ICD-10-CM | POA: Diagnosis not present

## 2016-06-22 DIAGNOSIS — E118 Type 2 diabetes mellitus with unspecified complications: Secondary | ICD-10-CM | POA: Diagnosis not present

## 2016-06-22 DIAGNOSIS — E43 Unspecified severe protein-calorie malnutrition: Secondary | ICD-10-CM | POA: Diagnosis not present

## 2016-06-22 DIAGNOSIS — K219 Gastro-esophageal reflux disease without esophagitis: Secondary | ICD-10-CM | POA: Diagnosis not present

## 2016-06-22 DIAGNOSIS — R5381 Other malaise: Secondary | ICD-10-CM | POA: Diagnosis not present

## 2016-06-22 DIAGNOSIS — E119 Type 2 diabetes mellitus without complications: Secondary | ICD-10-CM | POA: Diagnosis not present

## 2016-06-22 DIAGNOSIS — F1021 Alcohol dependence, in remission: Secondary | ICD-10-CM | POA: Diagnosis not present

## 2016-06-22 DIAGNOSIS — R296 Repeated falls: Secondary | ICD-10-CM | POA: Diagnosis not present

## 2016-06-22 DIAGNOSIS — R410 Disorientation, unspecified: Secondary | ICD-10-CM | POA: Diagnosis not present

## 2016-06-22 DIAGNOSIS — M6281 Muscle weakness (generalized): Secondary | ICD-10-CM | POA: Diagnosis not present

## 2016-06-22 DIAGNOSIS — M170 Bilateral primary osteoarthritis of knee: Secondary | ICD-10-CM | POA: Diagnosis not present

## 2016-06-22 DIAGNOSIS — E512 Wernicke's encephalopathy: Secondary | ICD-10-CM | POA: Diagnosis not present

## 2016-06-22 DIAGNOSIS — M25569 Pain in unspecified knee: Secondary | ICD-10-CM | POA: Diagnosis not present

## 2016-06-22 DIAGNOSIS — E46 Unspecified protein-calorie malnutrition: Secondary | ICD-10-CM | POA: Diagnosis not present

## 2016-06-22 DIAGNOSIS — R488 Other symbolic dysfunctions: Secondary | ICD-10-CM | POA: Diagnosis not present

## 2016-06-22 DIAGNOSIS — R262 Difficulty in walking, not elsewhere classified: Secondary | ICD-10-CM | POA: Diagnosis not present

## 2016-06-22 DIAGNOSIS — R4182 Altered mental status, unspecified: Secondary | ICD-10-CM | POA: Diagnosis not present

## 2016-06-22 DIAGNOSIS — F319 Bipolar disorder, unspecified: Secondary | ICD-10-CM | POA: Diagnosis not present

## 2016-06-22 DIAGNOSIS — K8689 Other specified diseases of pancreas: Secondary | ICD-10-CM | POA: Diagnosis not present

## 2016-06-22 DIAGNOSIS — F10239 Alcohol dependence with withdrawal, unspecified: Secondary | ICD-10-CM | POA: Diagnosis not present

## 2016-06-22 DIAGNOSIS — R419 Unspecified symptoms and signs involving cognitive functions and awareness: Secondary | ICD-10-CM | POA: Diagnosis not present

## 2016-06-22 DIAGNOSIS — R270 Ataxia, unspecified: Secondary | ICD-10-CM | POA: Diagnosis not present

## 2016-06-22 LAB — GLUCOSE, CAPILLARY
GLUCOSE-CAPILLARY: 213 mg/dL — AB (ref 65–99)
GLUCOSE-CAPILLARY: 296 mg/dL — AB (ref 65–99)
Glucose-Capillary: 219 mg/dL — ABNORMAL HIGH (ref 65–99)

## 2016-06-22 MED ORDER — INSULIN ASPART 100 UNIT/ML ~~LOC~~ SOLN: 0.0000 [IU] | Freq: Every day | SUBCUTANEOUS | 11 refills | Status: DC

## 2016-06-22 MED ORDER — FOLIC ACID 1 MG PO TABS: 1.0000 mg | ORAL_TABLET | Freq: Every day | ORAL | 0 refills | Status: DC

## 2016-06-22 MED ORDER — ENSURE ENLIVE PO LIQD: 237.0000 mL | Freq: Three times a day (TID) | ORAL | 12 refills | Status: DC

## 2016-06-22 MED ORDER — ADULT MULTIVITAMIN W/MINERALS CH: 1.0000 | ORAL_TABLET | Freq: Every day | ORAL | 0 refills | Status: DC

## 2016-06-22 MED ORDER — PRAVASTATIN SODIUM 10 MG PO TABS: 10.0000 mg | ORAL_TABLET | Freq: Every day | ORAL | 0 refills | Status: DC

## 2016-06-22 MED ORDER — VITAMIN D (ERGOCALCIFEROL) 1.25 MG (50000 UNIT) PO CAPS: 50000.0000 [IU] | ORAL_CAPSULE | ORAL | 0 refills | Status: AC

## 2016-06-22 MED ORDER — DICLOFENAC SODIUM 1 % TD GEL: 2.0000 g | Freq: Three times a day (TID) | TRANSDERMAL | 0 refills | Status: DC

## 2016-06-22 MED ORDER — INSULIN ASPART 100 UNIT/ML ~~LOC~~ SOLN: 0.0000 [IU] | Freq: Three times a day (TID) | SUBCUTANEOUS | 11 refills | Status: DC

## 2016-06-22 NOTE — Progress Notes (Signed)
06/22/2016 3:49 PM  Patient refused to get on stretcher. Called patient sister [name] and he spoke to his sister on the phone. Patient willing to get on the stretcher with EMS to discharge to [facility].   Sherial Ebrahim [employer], RN-BC [employer] Phone [phone]

## 2016-06-22 NOTE — Progress Notes (Signed)
Attemted 3rd time, secretary could not get anyone on phone, took my number and ext and she said someone would call me back.

## 2016-06-22 NOTE — Progress Notes (Signed)
Called report to [employer] at [facility]. Advised [facility] is here to transport patient.

## 2016-06-22 NOTE — Progress Notes (Signed)
   Subjective: Michael [name] was seen and evaluated today in his recliner at the nurses station. Eating breakfast. No events overnight. Continues to ramble tangentially.   Objective:  Vital signs in last 24 hours: Vitals:   06/21/16 0603 06/21/16 1700 06/22/16 0015 06/22/16 0457  BP: (!) 154/88 140/88 (!) 156/88 109/67  Pulse: 92 88 92 (!) 101  Resp: 18 17 16 17   Temp: 97.7 F (36.5 C) 98.2 F (36.8 C) 98 F (36.7 C) 98 F (36.7 C)  TempSrc: Oral Oral Oral Oral  SpO2: 99% 100% 100% 100%  Weight: 153 lb 4.8 oz (69.5 kg)     Height:       General: Chronically-ill appearing AA male sitting in recliner eating breakfast without issue. Pleasant and very conversant.. Cardiovascular: RRR. No murmur or rub appreciated. Pulmonary: CTA BL. Normal WOB on RA.  Abdomen: Soft, not tender nor distended. +bs Extremities: Warm and perfused. No LE edema. Skin: Warm, dry. No cyanosis.  Psych: Alert and oriented to person, place, date, birthday. Still rambles tangentially with confabulation and poor memory.  Assessment/Plan:61 y/o M w/ alcohol-induced neurocognitive disorder (?wernike), severe tricompartmental OA of BL knees and chronic deconditioning here with long history of recurrent falls at home. Family unable to provide 24 hr hour assistance/supervision that this patient requires.   Principal Problem:   Falls Active Problems:   Protein-calorie malnutrition, severe   Tricompartment osteoarthritis of both knees   Wernicke's encephalopathy   Ataxia  Recurrent Falls: Multifactorial. CSW working SNF placement and we greatly appreciate their assistance. I suspect he would benefit from placement in a facility with memory care. -SNF placement -Ibuprofen, Voltaren gel prn  AMS, Hx of Alcohol abuse, ?Wernike Encephalopathy: Confabulation, fluctuating AMS and ataxia. Ophthalmoplegia PTA resolved. He appears to be sundowning.  -Thiamine daily -Please continue redirection  Brittle Type 2 Diabetes,  Chronic Pancreatic Insufficiency, Hx of hypoglycemic seizures: Doing well on current sliding scale. -Sliding scale ONLY -Creon  Bipolar disorder, schizophrenia: w/o SI/HI. Continue home Aripiprazole 5 mg, duloxetine 60 mg.  Dispo: Anticipated discharge once SNF bed available. He would benefit from placement in SNF with concurrent memory care unit.   Michael Rinkenberger, DO 06/22/2016, 7:04 AM Pager: [phone]

## 2016-06-22 NOTE — Progress Notes (Signed)
Pt left floor via [facility] to [employer] with belongings

## 2016-06-22 NOTE — Progress Notes (Signed)
Pt at desk in [place] for closer observation.

## 2016-06-22 NOTE — Progress Notes (Signed)
Attempted to call report to [facility] place, no answer then phone was disconnected,

## 2016-06-22 NOTE — Clinical Social Work Placement (Signed)
   CLINICAL SOCIAL WORK PLACEMENT  NOTE 06/22/16 - DISCHARGED TO [facility]IA AMBULANCE **INSURANCE AUTHORIZATION INFORMATION IN ADDITIONAL COMMENTS BELOW**  Date:  06/22/2016  Patient Details  Name: Michael [name] MRN: [id number] Date of Birth: 05-01-1955  Clinical Social Work is seeking post-discharge placement for this patient at the [place] level of care (*CSW will initial, date and re-position this form in  chart as items are completed):  No (Talked with daughter by phone and was given facility preference)   Patient/family provided with [place] Work Department's list of facilities offering this level of care within the geographic area requested by the patient (or if unable, by the patient's family).  Yes   Patient/family informed of their freedom to choose among providers that offer the needed level of care, that participate in Medicare, Medicaid or managed care program needed by the patient, have an available bed and are willing to accept the patient.  No   Patient/family informed of 's ownership interest in [facility] and [facility], as well as of the fact that they are under no obligation to receive care at these facilities.  PASRR submitted to EDS on 06/20/16     PASRR number received on 06/20/16 ([id number], eff. 5/29-6/28/18)     Existing PASRR number confirmed on       FL2 transmitted to all facilities in geographic area requested by pt/family on 06/20/16     FL2 transmitted to all facilities within larger geographic area on       Patient informed that his/her managed care company has contracts with or will negotiate with certain facilities, including the following:        Yes   Patient/family informed of bed offers received.  Patient chooses bed at [facility]     Physician recommends and patient chooses bed at      Patient to be transferred to [facility] on 06/22/16.  Patient to be  transferred to facility by Ambulance     Patient family notified on 06/22/16 of transfer.  Name of family member notified:  [name], sister - [phone]     PHYSICIAN      Additional Comment:  06/22/16 - Received call from [place] [phone], ext. [phone]) from [facility] with authorization number for patient - #[id number], eff. 06/22/16. Clinical due 06/28/16.   _______________________________________________ Sable Feil, LCSW 06/22/2016, 2:31 PM

## 2016-06-22 NOTE — Progress Notes (Signed)
Late Entry 06/22/16 Approx 1330 Patient becoming agitated at nurses station in [place]. Walked in hallway with NT using walker and gaitbelt. ~200 patient became agitated and walked down the hall without staff. Later sat in a chair in the hallway and and refused to come back to nurses station stating he was looking for a ride home. Attempted to reorient patient without any results. Patient requesting additional pie from lunch. Informed patient that call had been made to the cafeteria to request additional pie.  Patient eventually walked to nurses station to sit in recliner. One security guard called for standby assistance.  Breezy Hertenstein, Bryn Gulling

## 2016-06-22 NOTE — Progress Notes (Signed)
Internal Medicine Attending:   I saw and examined the patient. I reviewed the resident's note and I agree with the resident's findings and plan as documented in the resident's note.  Patient feels well today with no new complaints. He does appear more alert and oriented today and answered questions appropriately. He still prone to tangential conversation but improved. Patient was admitted with recurrent falls after discharge likely secondary to multiple etiologies including bilateral knee OA, fluctuating altered mental status/confusion as well as possible ataxia. His confusion is most likely secondary to a cognitive defect attributable to his heavy alcohol use. He is currently awaiting placement for SNF. No further workup for now.

## 2016-06-22 NOTE — Progress Notes (Signed)
Was given [employer] of nursing Michael [name] [phone].  Called this number went to voice mail.

## 2016-06-23 ENCOUNTER — Non-Acute Institutional Stay (SKILLED_NURSING_FACILITY): Payer: Medicare Other | Admitting: Internal Medicine

## 2016-06-23 ENCOUNTER — Encounter: Payer: Self-pay | Admitting: Internal Medicine

## 2016-06-23 DIAGNOSIS — E785 Hyperlipidemia, unspecified: Secondary | ICD-10-CM

## 2016-06-23 DIAGNOSIS — F1099 Alcohol use, unspecified with unspecified alcohol-induced disorder: Secondary | ICD-10-CM

## 2016-06-23 DIAGNOSIS — K8689 Other specified diseases of pancreas: Secondary | ICD-10-CM | POA: Diagnosis not present

## 2016-06-23 DIAGNOSIS — E559 Vitamin D deficiency, unspecified: Secondary | ICD-10-CM

## 2016-06-23 DIAGNOSIS — R419 Unspecified symptoms and signs involving cognitive functions and awareness: Secondary | ICD-10-CM

## 2016-06-23 DIAGNOSIS — R5381 Other malaise: Secondary | ICD-10-CM | POA: Diagnosis not present

## 2016-06-23 DIAGNOSIS — K219 Gastro-esophageal reflux disease without esophagitis: Secondary | ICD-10-CM

## 2016-06-23 DIAGNOSIS — R270 Ataxia, unspecified: Secondary | ICD-10-CM | POA: Diagnosis not present

## 2016-06-23 DIAGNOSIS — F32A Depression, unspecified: Secondary | ICD-10-CM

## 2016-06-23 DIAGNOSIS — M170 Bilateral primary osteoarthritis of knee: Secondary | ICD-10-CM

## 2016-06-23 DIAGNOSIS — IMO0002 Reserved for concepts with insufficient information to code with codable children: Secondary | ICD-10-CM

## 2016-06-23 DIAGNOSIS — F329 Major depressive disorder, single episode, unspecified: Secondary | ICD-10-CM

## 2016-06-23 DIAGNOSIS — E1142 Type 2 diabetes mellitus with diabetic polyneuropathy: Secondary | ICD-10-CM | POA: Diagnosis not present

## 2016-06-23 DIAGNOSIS — F319 Bipolar disorder, unspecified: Secondary | ICD-10-CM

## 2016-06-23 NOTE — Progress Notes (Signed)
LOCATION: [facility]  PCP: Collier Salina, MD   Code Status: Full Code  Goals of care: Advanced Directive information Advanced Directives 06/18/2016  Does Patient Have a Medical Advance Directive? No  Does patient want to make changes to medical advance directive? -  Would patient like information on creating a medical advance directive? No - Patient declined  Pre-existing out of facility DNR order (yellow form or pink MOST form) -  Some encounter information is confidential and restricted. Go to Review Flowsheets activity to see all data.       Extended Emergency Contact Information Primary Emergency Contact: Strub,[name] Address: [address]          [place] [address] Johnnette Litter of [place] Phone: [phone] Relation: Sister Secondary Emergency Contact: [name],[name] Address: [address]           [place], [place] [address] Johnnette Litter of [place] Mobile Phone: [phone] Relation: Sister   Allergies  Allergen Reactions  . Ace Inhibitors Swelling and Other (See Comments)    Angioedema - unquestionable  . Losartan Swelling and Other (See Comments)    Pt presented with unquestionable angioedema on ACEIs (Angiotensin-converting enzyme (ACE) inhibitors) so aviod ARBs (Angiotensin receptor blockers), as well  . Tylenol [Acetaminophen] Other (See Comments)    "cannot have this" (contraindicated)    Chief Complaint  Patient presents with  . New Admit To SNF    New Admission Visit      HPI:  Patient is a 61 y.o. male seen today for short term rehabilitation post hospital admission from 06/18/16-06/21/16 with recurrent fall from ataxia, osteoarthritis and deconditioning. He also has wernicke's encephalopathy. He is seen in his room today. He has medical history of brittle diabetes, alcohol related neurocognitive disorder, chronic pancreatic insufficiency among others. Per nursing staff, he has made several attempts to get out of the bed  unassisted. No fall has been reported.   Review of Systems:  Constitutional: Negative for fever, chills, diaphoresis.  HENT: Negative for headache, congestion,sore throat, difficulty swallowing. Positive for occasional nasal discharge.   Eyes: Negative for double vision and discharge.  Respiratory: Negative for cough, shortness of breath and wheezing.   Cardiovascular: Negative for chest pain, palpitations, leg swelling.  Gastrointestinal: Negative for heartburn, nausea, vomiting, abdominal pain, loss of appetite, melena, diarrhea and constipation. Last bowel movement was this morning. Genitourinary: Negative for dysuria and flank pain.  Musculoskeletal: Negative for back pain, fall in the facility.  Skin: Negative for itching, rash.  Neurological: Negative for dizziness. Positive for chronic neuropathy. Psychiatric/Behavioral: Negative for depression.   Past Medical History:  Diagnosis Date  . Anxiety   . Arthritis    "joints; shoulders; feet" (06/08/2016)  . Bipolar disorder ([place])   . Daily headache    "7:30 - 8:00 q night" (06/08/2016)  . Diabetic peripheral neuropathy ([place])   . GERD (gastroesophageal reflux disease)   . Pancreatitis   . Schizophrenia ([place])   . Seizures ([place]) 01/2016 X 2  . Type II diabetes mellitus ([place])    Past Surgical History:  Procedure Laterality Date  . BALLOON DILATION  03/22/2011   Procedure: BALLOON DILATION;  Surgeon: Gatha Mayer, MD;  Location: [facility];  Service: Endoscopy;  Laterality: N/A;  . COLONOSCOPY N/A 05/22/2012   Procedure: COLONOSCOPY;  Surgeon: Gatha Mayer, MD;  Location: [facility];  Service: Endoscopy;  Laterality: N/A;  . COLONOSCOPY W/ BIOPSIES AND POLYPECTOMY  09/12/11  . ESOPHAGOGASTRODUODENOSCOPY  03/22/2011   Procedure:  ESOPHAGOGASTRODUODENOSCOPY (EGD);  Surgeon: Gatha Mayer, MD;  Location: [facility];  Service: Endoscopy;  Laterality: N/A;  egd with balloon   . EUS  04/20/2011   Procedure: UPPER ENDOSCOPIC  ULTRASOUND (EUS) LINEAR;  Surgeon: Milus Banister, MD;  Location: [facility];  Service: Endoscopy;  Laterality: N/A;  . KNEE ARTHROSCOPY Left    Social History:   reports that he has been smoking Cigarettes.  He has a 10.50 pack-year smoking history. He has never used smokeless tobacco. He reports that he drinks about 36.0 oz of alcohol per week . He reports that he uses drugs, including Marijuana.  Family History  Problem Relation Age of Onset  . Hypertension Sister   . Diabetes Sister   . Heart disease Sister   . Colon cancer Neg Hx   . Stomach cancer Neg Hx   . Anesthesia problems Neg Hx   . Hypotension Neg Hx   . Pseudochol deficiency Neg Hx   . Malignant hyperthermia Neg Hx     Medications: Allergies as of 06/23/2016      Reactions   Ace Inhibitors Swelling, Other (See Comments)   Angioedema - unquestionable   Losartan Swelling, Other (See Comments)   Pt presented with unquestionable angioedema on ACEIs (Angiotensin-converting enzyme (ACE) inhibitors) so aviod ARBs (Angiotensin receptor blockers), as well   Tylenol [acetaminophen] Other (See Comments)   "cannot have this" (contraindicated)      Medication List       Accurate as of 06/23/16  9:38 AM. Always use your most recent med list.          ARIPiprazole 5 MG tablet Commonly known as:  ABILIFY Take 5 mg by mouth daily.   diclofenac sodium 1 % Gel Commonly known as:  VOLTAREN Apply 2 g topically 4 (four) times daily -  before meals and at bedtime.   DULoxetine 60 MG capsule Commonly known as:  CYMBALTA TAKE 1 CAPSULE EVERY DAY   folic acid 1 MG tablet Commonly known as:  FOLVITE Take 1 tablet (1 mg total) by mouth daily.   gabapentin 300 MG capsule Commonly known as:  NEURONTIN TAKE 3 CAPSULES BY MOUTH  EVERY MORNING AND  AFTERNOON. TAKE 4 CAPSULES  BY MOUTH AT NIGHT BEFORE  BED   glucose blood test strip Commonly known as:  ACCU-CHEK AVIVA PLUS Use 3 times daily to check blood sugar. diag code  E11.42. Insulin dependent   hydrOXYzine 10 MG tablet Commonly known as:  ATARAX/VISTARIL Take 1 tablet (10 mg total) by mouth 3 (three) times daily as needed for itching.   ibuprofen 800 MG tablet Commonly known as:  ADVIL,MOTRIN Take 800 mg by mouth 3 (three) times daily as needed.   insulin aspart 100 UNIT/ML injection Commonly known as:  novoLOG Inject 0-5 Units into the skin at bedtime.   insulin aspart 100 UNIT/ML injection Commonly known as:  novoLOG Inject 0-9 Units into the skin 3 (three) times daily with meals.   lipase/protease/amylase 36000 UNITS Cpep capsule Commonly known as:  CREON TAKE 1 CAPSULE BY MOUTH 3  TIMES DAILY BEFORE MEALS   multivitamin with minerals Tabs tablet Take 1 tablet by mouth daily.   pantoprazole 40 MG tablet Commonly known as:  PROTONIX Take 1 tablet (40 mg total) by mouth daily.   pravastatin 10 MG tablet Commonly known as:  PRAVACHOL Take 1 tablet (10 mg total) by mouth daily at 6 PM.   thiamine 100 MG tablet Commonly known as:  VITAMIN B-1  Take 1 tablet (100 mg total) by mouth daily.   UNABLE TO FIND Med Name: Med pass 120 mL by mouth 3 times daily   Vitamin D (Ergocalciferol) 50000 units Caps capsule Commonly known as:  DRISDOL Take 1 capsule (50,000 Units total) by mouth every 7 (seven) days. Start taking on:  06/28/2016       Immunizations: Immunization History  Administered Date(s) Administered  . Influenza Split 10/06/2011  . Influenza,inj,Quad PF,36+ Mos 10/11/2012, 01/19/2014, 10/23/2014, 01/14/2016  . PPD Test 06/20/2011, 05/29/2012  . Pneumococcal Conjugate-13 01/14/2016  . Pneumococcal Polysaccharide-23 11/14/2010  . Tdap 08/25/2011     Physical Exam: Vitals:   06/23/16 0932  BP: 134/80  Pulse: 99  Resp: 18  Temp: 97.8 F (36.6 C)  TempSrc: Oral  SpO2: 97%  Weight: 153 lb 4.8 oz (69.5 kg)  Height: 5\' 5"  (1.651 m)   Body mass index is 25.51 kg/m.  General- adult male, well built, in no acute  distress Head- normocephalic, atraumatic Nose- no maxillary or frontal sinus tenderness, no nasal discharge Throat- moist mucus membrane, normal oropharynx, edentulous  Eyes- PERRLA, EOMI, no pallor, no icterus, no discharge, normal conjunctiva, normal sclera Neck- no cervical lymphadenopathy Cardiovascular- normal s1,s2, no murmur Respiratory- bilateral clear to auscultation, no wheeze, no rhonchi, no crackles, no use of accessory muscles Abdomen- bowel sounds present, soft, non tender, no guarding or rigidity Musculoskeletal- able to move all 4 extremities, generalized weakness, unsteady gait Neurological- alert and oriented to person, place and time, confabulation Skin- warm and dry Psychiatry- normal mood and affect    Labs reviewed: Basic Metabolic Panel:  Recent Labs  09/03/15 0550  06/06/16 0359  06/11/16 1018 06/18/16 1745 06/18/16 1951 06/20/16 0755  NA  --   < > 135  < > 138 143  --  140  K  --   < > 3.1*  < > 3.4* 3.6  --  3.7  CL  --   < > 102  < > 106 108  --  105  CO2  --   < > 26  < > 26 31  --  27  GLUCOSE  --   < > 188*  < > 212* 116*  --  166*  BUN  --   < > 8  < > 8 10  --  8  CREATININE  --   < > 0.53*  < > 0.71 0.93  --  0.67  CALCIUM  --   < > 8.6*  < > 8.8* 8.6*  --  8.9  MG 1.5*  --  1.6*  --   --   --  1.8  --   PHOS  --   --   --   --   --   --  3.3  --   < > = values in this interval not displayed. Liver Function Tests:  Recent Labs  06/09/16 0225 06/11/16 1018 06/18/16 1745  AST 41 24 33  ALT 44 30 26  ALKPHOS 108 77 82  BILITOT 0.6 0.6 0.3  PROT 7.2 5.4* 5.0*  ALBUMIN 4.0 3.1* 2.9*    Recent Labs  06/09/16 0225  LIPASE 14    Recent Labs  09/02/15 1823 06/05/16 0027 06/09/16 0225  AMMONIA 31 69* 24   CBC:  Recent Labs  06/09/16 0225 06/11/16 1018 06/18/16 1745 06/18/16 2202 06/19/16 0435 06/20/16 0755  WBC 5.7 5.3 7.1  --  6.6 8.0  NEUTROABS 3.1 1.9 4.2  --   --   --  HGB 17.6* 13.3 12.9*  --  12.7* 13.5  HCT  49.3 38.8* 37.5* 38.3 37.5* 40.0  MCV 92.3 91.9 92.6  --  92.8 92.8  PLT PLATELET CLUMPS NOTED ON SMEAR, COUNT APPEARS ADEQUATE 172 204  --  189 196   Cardiac Enzymes: No results for input(s): CKTOTAL, CKMB, CKMBINDEX, TROPONINI in the last 8760 hours. BNP: Invalid input(s): POCBNP CBG:  Recent Labs  06/22/16 0456 06/22/16 0742 06/22/16 1207  GLUCAP 219* 213* 296*    Radiological Exams: Ct Head Wo Contrast  Result Date: 06/18/2016 CLINICAL DATA:  Weakness and multiple recent falls EXAM: CT HEAD WITHOUT CONTRAST TECHNIQUE: Contiguous axial images were obtained from the base of the skull through the vertex without intravenous contrast. COMPARISON:  06/12/2016 FINDINGS: Brain: Mild atrophic changes are noted. Mild chronic white matter ischemic change is seen. No findings to suggest acute hemorrhage, acute infarction or space-occupying mass lesion are seen. Vascular: No hyperdense vessel or unexpected calcification. Skull: Normal. Negative for fracture or focal lesion. Sinuses/Orbits: No acute finding. Other: None. IMPRESSION: Chronic atrophic and ischemic changes without acute abnormality. Electronically Signed   By: Inez Catalina M.D.   On: 06/18/2016 18:16   Ct Head Wo Contrast  Result Date: 06/08/2016 CLINICAL DATA:  Altered mental status EXAM: CT HEAD WITHOUT CONTRAST TECHNIQUE: Contiguous axial images were obtained from the base of the skull through the vertex without intravenous contrast. COMPARISON:  06/05/2016 FINDINGS: Brain: No acute territorial infarction, hemorrhage or intracranial mass is visualized. Periventricular and subcortical white matter hypodensities consistent with small vessel ischemic change. Mild atrophy. Stable ventricle size. Vascular: No hyperdense vessels.  Carotid artery calcifications. Skull: No fracture or suspicious bone lesion. Small fluid in the inferior mastoids. Sinuses/Orbits: Clear paranasal sinuses. No acute orbital abnormality. Other: None IMPRESSION: No  CT evidence for acute intracranial abnormality. Moderate small vessel ischemic changes of the white matter along with atrophy. Electronically Signed   By: Donavan Foil M.D.   On: 06/08/2016 23:29   Ct Head Wo Contrast  Result Date: 06/05/2016 CLINICAL DATA:  Confusion since last evening EXAM: CT HEAD WITHOUT CONTRAST TECHNIQUE: Contiguous axial images were obtained from the base of the skull through the vertex without intravenous contrast. COMPARISON:  12/03/2014 FINDINGS: BRAIN: There is sulcal and ventricular prominence consistent with superficial and central atrophy. No intraparenchymal hemorrhage, mass effect nor midline shift. Periventricular and subcortical white matter hypodensities consistent with chronic small vessel ischemic disease are identified. No acute large vascular territory infarcts. No abnormal extra-axial fluid collections. Basal cisterns are not effaced and midline. VASCULAR: Moderate calcific atherosclerosis of the carotid siphons. SKULL: No skull fracture. No significant scalp soft tissue swelling. SINUSES/ORBITS: The mastoid air-cells are clear. The included paranasal sinuses are well-aerated.The included ocular globes and orbital contents are non-suspicious. OTHER: None. IMPRESSION: Chronic small vessel ischemic disease of periventricular and subcortical white matter. Cerebral atrophy. Electronically Signed   By: Ashley Royalty M.D.   On: 06/05/2016 02:10   Mr Brain Wo Contrast  Result Date: 06/12/2016 CLINICAL DATA:  Initial evaluation for ophthalmoplegia. EXAM: MRI HEAD WITHOUT CONTRAST TECHNIQUE: Multiplanar, multiecho pulse sequences of the brain and surrounding structures were obtained without intravenous contrast. COMPARISON:  Prior CT from 06/08/2016. FINDINGS: Brain: Diffuse prominence of the CSF containing spaces compatible with generalized cerebral atrophy. Patchy and confluent T2/FLAIR hyperintensity within the periventricular and deep white matter both cerebral hemispheres  most compatible chronic small vessel ischemic disease, moderate nature. Small remote lacunar infarcts present within the bilateral thalami. No abnormal foci  of restricted diffusion to suggest acute or subacute ischemia. Gray-white matter differentiation maintained. No other areas of encephalomalacia to suggest chronic infarction. No acute or chronic intracranial hemorrhage identified on this motion degraded exam. No mass lesion, midline shift or mass effect. Ventricles normal size without evidence for hydrocephalus. No extra-axial fluid collection. Major dural sinuses are grossly patent. Pituitary gland suprasellar region within normal limits. Midline structures intact and normal. Vascular: Major intracranial vascular flow voids are maintained. Right vertebral artery diminutive/hypoplastic. Skull and upper cervical spine: Craniocervical junction within normal limits. Visualized upper cervical spine unremarkable. Bone marrow signal intensity within normal limits. No scalp soft tissue abnormality. Sinuses/Orbits: Globes and orbital soft tissues demonstrate no acute abnormality. Paranasal sinuses are clear. Small left mastoid effusion noted, of doubtful significance. Inner ear structures grossly normal. Other: None. IMPRESSION: 1. No acute intracranial process identified. 2. Moderate chronic microvascular ischemic disease with small remote bilateral thalamic lacunar infarcts. Electronically Signed   By: Jeannine Boga M.D.   On: 06/12/2016 23:18   Dg Chest Port 1 View  Result Date: 06/08/2016 CLINICAL DATA:  Altered mental status EXAM: PORTABLE CHEST 1 VIEW COMPARISON:  09/02/2015 FINDINGS: The heart size and mediastinal contours are within normal limits. Both lungs are clear. The visualized skeletal structures are unremarkable. IMPRESSION: No active disease. Electronically Signed   By: Donavan Foil M.D.   On: 06/08/2016 22:27    Assessment/Plan  Physical deconditioning Will have him work with physical  therapy and occupational therapy team to help with gait training and muscle strengthening exercises.fall precautions. Skin care. Encourage to be out of bed.   Ataxia From his neuropathy and alcohol induced neurodegenerative disorder. Will have patient work with PT/OT as tolerated to regain strength and restore function.  Fall precautions are in place.  Neurocognitive disorder Secondary to alcohol use. Has confabulation and disorientation episodes. Will need supportive care for now. continue thiamine and folic acid. Fall precautions. SLP consult.   Chronic pancreatic insufficiency Continue his creon with meals.   Depression Stable mood, continue duloxetine  Alcohol use disorder Continue thiamine and folic acid supplement. D/c ibuprofen  gerd Continue protonix, no changes made  Vitamin d def Continue vitamin d supplement  Type 2 DM with neuropathy Lab Results  Component Value Date   HGBA1C 11.5 01/14/2016   Brittle DM per d/c note. No recent a1c. Check a1c and adjust insulin accordingly. Currently on SSI novolog. Make change to coverage only for cbg >200. Continue gabapentin  HLD Continue his pravachol  OA D/c ibuprofen. Place him on tylenol 650 mg q8h prn pain. To work with therapy team. Continue voltaren gel.  Bipolar disorder Continue aripiprazole and duloxetine   Goals of care: short term rehabilitation   Labs/tests ordered: cbc, cmp, a1c 06/26/16  Family/ staff Communication: reviewed care plan with patient and nursing supervisor    Blanchie Serve, MD Internal Medicine [place], [place] [address] Cell Phone (Monday-Friday 8 am - 5 pm): [phone] On Call: [phone] and follow prompts after 5 pm and on weekends Office Phone: [phone] Office Fax: [phone]

## 2016-06-27 ENCOUNTER — Telehealth: Payer: Self-pay | Admitting: Internal Medicine

## 2016-06-27 NOTE — Telephone Encounter (Signed)
ORDER WAS SENT TO [place] FROM [facility] SO THAT THEY COULD SHIP HIS MEDS TO HIS HOUSE, HE WHATS TO KNOW WHEN THAT WILL START

## 2016-06-27 NOTE — Telephone Encounter (Signed)
I called [facility] - stated they will re-fax form for Dr Benjamine Mola to complete for home delivery of pt's medications.

## 2016-06-28 NOTE — Telephone Encounter (Signed)
List of medication refills were faxed from [facility].  Dr Benjamine Mola viewed list; refilled omeprazole and diclofenac And I sent back the ones that pt is not taking with " Pt is not taking" per Dr Benjamine Mola.

## 2016-06-30 DIAGNOSIS — M25569 Pain in unspecified knee: Secondary | ICD-10-CM | POA: Diagnosis not present

## 2016-06-30 DIAGNOSIS — R296 Repeated falls: Secondary | ICD-10-CM | POA: Diagnosis not present

## 2016-07-04 DIAGNOSIS — R296 Repeated falls: Secondary | ICD-10-CM | POA: Diagnosis not present

## 2016-07-04 DIAGNOSIS — M1990 Unspecified osteoarthritis, unspecified site: Secondary | ICD-10-CM | POA: Diagnosis not present

## 2016-07-04 DIAGNOSIS — I10 Essential (primary) hypertension: Secondary | ICD-10-CM | POA: Diagnosis not present

## 2016-07-04 DIAGNOSIS — R278 Other lack of coordination: Secondary | ICD-10-CM | POA: Diagnosis not present

## 2016-07-04 DIAGNOSIS — R262 Difficulty in walking, not elsewhere classified: Secondary | ICD-10-CM | POA: Diagnosis not present

## 2016-07-05 ENCOUNTER — Telehealth: Payer: Self-pay

## 2016-07-05 DIAGNOSIS — R278 Other lack of coordination: Secondary | ICD-10-CM | POA: Diagnosis not present

## 2016-07-05 DIAGNOSIS — M1990 Unspecified osteoarthritis, unspecified site: Secondary | ICD-10-CM | POA: Diagnosis not present

## 2016-07-05 DIAGNOSIS — R262 Difficulty in walking, not elsewhere classified: Secondary | ICD-10-CM | POA: Diagnosis not present

## 2016-07-05 DIAGNOSIS — I10 Essential (primary) hypertension: Secondary | ICD-10-CM | POA: Diagnosis not present

## 2016-07-05 DIAGNOSIS — R296 Repeated falls: Secondary | ICD-10-CM | POA: Diagnosis not present

## 2016-07-05 NOTE — Telephone Encounter (Signed)
I agree, that should be a reasonable plan for advancing his intervals.

## 2016-07-05 NOTE — Telephone Encounter (Signed)
Michael [name] with [facility] home health requesting VO. Please call back.  

## 2016-07-05 NOTE — Telephone Encounter (Signed)
VO given for [facility] PT for 2x week x 4 weeks 1x week x 2 for balance, gait training, strength, do you agree?

## 2016-07-07 ENCOUNTER — Encounter: Payer: Self-pay | Admitting: Internal Medicine

## 2016-07-07 ENCOUNTER — Ambulatory Visit (INDEPENDENT_AMBULATORY_CARE_PROVIDER_SITE_OTHER): Payer: Medicare HMO | Admitting: Internal Medicine

## 2016-07-07 VITALS — BP 137/80 | HR 102 | Temp 98.2°F | Wt 153.2 lb

## 2016-07-07 DIAGNOSIS — Z5189 Encounter for other specified aftercare: Secondary | ICD-10-CM

## 2016-07-07 DIAGNOSIS — F10288 Alcohol dependence with other alcohol-induced disorder: Secondary | ICD-10-CM | POA: Diagnosis not present

## 2016-07-07 DIAGNOSIS — M170 Bilateral primary osteoarthritis of knee: Secondary | ICD-10-CM

## 2016-07-07 DIAGNOSIS — E1142 Type 2 diabetes mellitus with diabetic polyneuropathy: Secondary | ICD-10-CM | POA: Diagnosis not present

## 2016-07-07 DIAGNOSIS — M1990 Unspecified osteoarthritis, unspecified site: Secondary | ICD-10-CM | POA: Diagnosis not present

## 2016-07-07 DIAGNOSIS — K8689 Other specified diseases of pancreas: Secondary | ICD-10-CM

## 2016-07-07 DIAGNOSIS — E512 Wernicke's encephalopathy: Secondary | ICD-10-CM

## 2016-07-07 DIAGNOSIS — Z794 Long term (current) use of insulin: Secondary | ICD-10-CM

## 2016-07-07 DIAGNOSIS — I10 Essential (primary) hypertension: Secondary | ICD-10-CM | POA: Diagnosis not present

## 2016-07-07 DIAGNOSIS — R278 Other lack of coordination: Secondary | ICD-10-CM | POA: Diagnosis not present

## 2016-07-07 DIAGNOSIS — R6889 Other general symptoms and signs: Secondary | ICD-10-CM | POA: Diagnosis not present

## 2016-07-07 DIAGNOSIS — R296 Repeated falls: Secondary | ICD-10-CM | POA: Diagnosis not present

## 2016-07-07 DIAGNOSIS — R262 Difficulty in walking, not elsewhere classified: Secondary | ICD-10-CM | POA: Diagnosis not present

## 2016-07-07 NOTE — Patient Instructions (Signed)
Michael [name] it was nice meeting you today.  -Continue taking your medications as instructed.  -I would encourage you to go to physical therapy for your knee pain.  -Our clinic will arrange extra help for you at home to help with medication management and activities of daily living.  -Please return to the clinic in 2 weeks with your meter. Until then, please check your sugars at least 3 times a day before meals.

## 2016-07-07 NOTE — Progress Notes (Signed)
Internal Medicine Clinic Attending  Case discussed with Dr. Rathoreat the time of the visit. We reviewed the resident's history and exam and pertinent patient test results. I agree with the assessment, diagnosis, and plan of care documented in the resident's note.  

## 2016-07-07 NOTE — Assessment & Plan Note (Signed)
Assessment Patient has a history of extremely brittle type 2 diabetes secondary to chronic pancreatic insufficiency. During his recent hospitalization, basal insulin was stopped and patient was advised to use sensitive sliding scale insulin. However, patient continues to use Levemir 32 units daily at bedtime and is using NovoLog 9 units 3 times a day. He did not bring his meter or medications to this visit. Denies having any hypoglycemic symptoms.  Plan -Advised him to check his blood sugar at least 3 times a day before meals and return to the clinic in 2 weeks with his meter -Advised him to stop taking basal insulin -Continue NovoLog 9 units 3 times a day. Patient has poor health literacy which would make it difficult for him to manage sliding scale insulin. -Check A1c at next visit

## 2016-07-07 NOTE — Assessment & Plan Note (Signed)
Assessment Patient has a history of severe osteoarthritis of his knees bilaterally. Current medication regimen includes ibuprofen and Voltaren gel. Patient states these medications are not helping and he needs something else for his pain. States "my entire body hurts"since 2011. Patient did not give me any details about his chronic pain. States home health PT is scheduled to come to his house today to evaluate him. I offered knee corticosteroid injections but patient declined stating they have not helped him in the past.  Plan -Advised him to continue taking ibuprofen 800 mg 3 times a day as needed -Use Voltaren gel 4 times a day consistently -Encouraged him to engage in physical therapy at this time and return to the clinic in 4-6 weeks if he notices no improvement in his symptoms.

## 2016-07-07 NOTE — Assessment & Plan Note (Signed)
History of present illness Patient was recently admitted on May 27 for evaluation of frequent falls and lack of necessary supervision/assistance at home. He has a history of alcohol use disorder, Wernicke's encephalopathy, and severe bilateral tricompartmental disease/ osteoarthritis of knees. Patient has had several admissions for homicidal ideations and delirium tremens followed by confusion and ophthalmoplegia thought to be secondary to his Wernicke's encephalopathy. After his recent hospitalization he was discharged to [facility] rehabilitation. Patient left [facility] on 07/01/2016. At present, he reports feeling well. He is oriented to person, place, time, and situation. Denies having any more falls since his hospital discharge. He was accompanied by his sister to this visit who helps him with medication management at home. However, sister appeared to be confused about which medications the patient is taking. Patient does state he needs help with activities of daily living such as cooking and cleaning.   Assessment Recurrent falls, ataxia in the setting of alcohol related neurocognitive disorder and severe bilateral osteoarthritis of knees.  Plan -Continue thiamine supplementation for Wernicke's -Home health PT has already been set up -[facility] staff will arrange additional home health services to help the patient with medication management and ADLs.

## 2016-07-07 NOTE — Progress Notes (Signed)
   CC: Patient is here for a hospital follow-up of recurrent falls, Wernicke's encephalopathy, and extremely brittle type 2 diabetes secondary to chronic pancreatic insufficiency.  HPI:  [name] is a 61 y.o. male with a past medical history of conditions listed below presenting to the clinic for a hospital follow-up of recurrent falls, Wernicke's encephalopathy, and extremely productive type 2 diabetes secondary to chronic pancreatic insufficiency. Please see problem based charting for the status of the patient's current and chronic medical conditions.   Past Medical History:  Diagnosis Date  . Anxiety   . Arthritis    "joints; shoulders; feet" (06/08/2016)  . Bipolar disorder ([place])   . Daily headache    "7:30 - 8:00 q night" (06/08/2016)  . Diabetic peripheral neuropathy ([place])   . GERD (gastroesophageal reflux disease)   . Pancreatitis   . Schizophrenia ([place])   . Seizures ([place]) 01/2016 X 2  . Type II diabetes mellitus ([place])     Review of Systems: Pertinent positives mentioned in HPI. Remainder of all ROS negative.   Physical Exam:  Vitals:   07/07/16 0945  BP: 137/80  Pulse: (!) 102  Temp: 98.2 F (36.8 C)  TempSrc: Oral  SpO2: 100%  Weight: 153 lb 3.2 oz (69.5 kg)   Physical Exam  Constitutional: He is oriented to person, place, and time. He appears well-developed and well-nourished. No distress.  HENT:  Head: Normocephalic and atraumatic.  Eyes: Right eye exhibits no discharge. Left eye exhibits no discharge.  Cardiovascular: Normal rate, regular rhythm and intact distal pulses.   Pulmonary/Chest: Effort normal and breath sounds normal. No respiratory distress. He has no wheezes. He has no rales.  Abdominal: Soft. Bowel sounds are normal. He exhibits no distension. There is no tenderness.  Musculoskeletal: He exhibits no edema.  Neurological: He is alert and oriented to person, place, and time.  Skin: Skin is warm and dry.    Assessment & Plan:   See  Encounters Tab for problem based charting.  Patient discussed with Dr. Lynnae January

## 2016-07-11 ENCOUNTER — Telehealth: Payer: Self-pay | Admitting: Internal Medicine

## 2016-07-11 DIAGNOSIS — R278 Other lack of coordination: Secondary | ICD-10-CM | POA: Diagnosis not present

## 2016-07-11 DIAGNOSIS — R296 Repeated falls: Secondary | ICD-10-CM | POA: Diagnosis not present

## 2016-07-11 DIAGNOSIS — R262 Difficulty in walking, not elsewhere classified: Secondary | ICD-10-CM | POA: Diagnosis not present

## 2016-07-11 DIAGNOSIS — I10 Essential (primary) hypertension: Secondary | ICD-10-CM | POA: Diagnosis not present

## 2016-07-11 DIAGNOSIS — M1990 Unspecified osteoarthritis, unspecified site: Secondary | ICD-10-CM | POA: Diagnosis not present

## 2016-07-11 NOTE — Telephone Encounter (Signed)
That sounds fine. You can pass it along.

## 2016-07-11 NOTE — Telephone Encounter (Signed)
[facility] OT therapist calling for VO.  Please call Sharyn Lull back.

## 2016-07-11 NOTE — Telephone Encounter (Signed)
OT would VO for 1x week x 1 week, 2x week for 2 weeks, 3x week for 1 week, VO given, do you agree

## 2016-07-12 DIAGNOSIS — R6889 Other general symptoms and signs: Secondary | ICD-10-CM | POA: Diagnosis not present

## 2016-07-12 DIAGNOSIS — R262 Difficulty in walking, not elsewhere classified: Secondary | ICD-10-CM | POA: Diagnosis not present

## 2016-07-12 DIAGNOSIS — R296 Repeated falls: Secondary | ICD-10-CM | POA: Diagnosis not present

## 2016-07-12 DIAGNOSIS — H2513 Age-related nuclear cataract, bilateral: Secondary | ICD-10-CM | POA: Diagnosis not present

## 2016-07-12 DIAGNOSIS — M1990 Unspecified osteoarthritis, unspecified site: Secondary | ICD-10-CM | POA: Diagnosis not present

## 2016-07-12 DIAGNOSIS — E119 Type 2 diabetes mellitus without complications: Secondary | ICD-10-CM | POA: Diagnosis not present

## 2016-07-12 DIAGNOSIS — R278 Other lack of coordination: Secondary | ICD-10-CM | POA: Diagnosis not present

## 2016-07-12 DIAGNOSIS — H40013 Open angle with borderline findings, low risk, bilateral: Secondary | ICD-10-CM | POA: Diagnosis not present

## 2016-07-12 DIAGNOSIS — I10 Essential (primary) hypertension: Secondary | ICD-10-CM | POA: Diagnosis not present

## 2016-07-12 LAB — HM DIABETES EYE EXAM

## 2016-07-13 ENCOUNTER — Telehealth: Payer: Self-pay

## 2016-07-13 DIAGNOSIS — R262 Difficulty in walking, not elsewhere classified: Secondary | ICD-10-CM | POA: Diagnosis not present

## 2016-07-13 DIAGNOSIS — M1990 Unspecified osteoarthritis, unspecified site: Secondary | ICD-10-CM | POA: Diagnosis not present

## 2016-07-13 DIAGNOSIS — I10 Essential (primary) hypertension: Secondary | ICD-10-CM | POA: Diagnosis not present

## 2016-07-13 DIAGNOSIS — R296 Repeated falls: Secondary | ICD-10-CM | POA: Diagnosis not present

## 2016-07-13 DIAGNOSIS — R278 Other lack of coordination: Secondary | ICD-10-CM | POA: Diagnosis not present

## 2016-07-13 NOTE — Telephone Encounter (Signed)
Paperwork completed and given to chillon.

## 2016-07-14 DIAGNOSIS — R278 Other lack of coordination: Secondary | ICD-10-CM | POA: Diagnosis not present

## 2016-07-14 DIAGNOSIS — I10 Essential (primary) hypertension: Secondary | ICD-10-CM | POA: Diagnosis not present

## 2016-07-14 DIAGNOSIS — R296 Repeated falls: Secondary | ICD-10-CM | POA: Diagnosis not present

## 2016-07-14 DIAGNOSIS — M1990 Unspecified osteoarthritis, unspecified site: Secondary | ICD-10-CM | POA: Diagnosis not present

## 2016-07-14 DIAGNOSIS — R262 Difficulty in walking, not elsewhere classified: Secondary | ICD-10-CM | POA: Diagnosis not present

## 2016-07-16 DIAGNOSIS — M1990 Unspecified osteoarthritis, unspecified site: Secondary | ICD-10-CM | POA: Diagnosis not present

## 2016-07-16 DIAGNOSIS — R262 Difficulty in walking, not elsewhere classified: Secondary | ICD-10-CM | POA: Diagnosis not present

## 2016-07-16 DIAGNOSIS — R296 Repeated falls: Secondary | ICD-10-CM | POA: Diagnosis not present

## 2016-07-16 DIAGNOSIS — I10 Essential (primary) hypertension: Secondary | ICD-10-CM | POA: Diagnosis not present

## 2016-07-16 DIAGNOSIS — R278 Other lack of coordination: Secondary | ICD-10-CM | POA: Diagnosis not present

## 2016-07-17 DIAGNOSIS — R296 Repeated falls: Secondary | ICD-10-CM | POA: Diagnosis not present

## 2016-07-17 DIAGNOSIS — M1990 Unspecified osteoarthritis, unspecified site: Secondary | ICD-10-CM | POA: Diagnosis not present

## 2016-07-17 DIAGNOSIS — R262 Difficulty in walking, not elsewhere classified: Secondary | ICD-10-CM | POA: Diagnosis not present

## 2016-07-17 DIAGNOSIS — R278 Other lack of coordination: Secondary | ICD-10-CM | POA: Diagnosis not present

## 2016-07-17 DIAGNOSIS — I10 Essential (primary) hypertension: Secondary | ICD-10-CM | POA: Diagnosis not present

## 2016-07-18 DIAGNOSIS — M1990 Unspecified osteoarthritis, unspecified site: Secondary | ICD-10-CM | POA: Diagnosis not present

## 2016-07-18 DIAGNOSIS — R262 Difficulty in walking, not elsewhere classified: Secondary | ICD-10-CM | POA: Diagnosis not present

## 2016-07-18 DIAGNOSIS — I10 Essential (primary) hypertension: Secondary | ICD-10-CM | POA: Diagnosis not present

## 2016-07-18 DIAGNOSIS — R296 Repeated falls: Secondary | ICD-10-CM | POA: Diagnosis not present

## 2016-07-18 DIAGNOSIS — R278 Other lack of coordination: Secondary | ICD-10-CM | POA: Diagnosis not present

## 2016-07-19 DIAGNOSIS — R296 Repeated falls: Secondary | ICD-10-CM | POA: Diagnosis not present

## 2016-07-19 DIAGNOSIS — R262 Difficulty in walking, not elsewhere classified: Secondary | ICD-10-CM | POA: Diagnosis not present

## 2016-07-19 DIAGNOSIS — R278 Other lack of coordination: Secondary | ICD-10-CM | POA: Diagnosis not present

## 2016-07-19 DIAGNOSIS — I10 Essential (primary) hypertension: Secondary | ICD-10-CM | POA: Diagnosis not present

## 2016-07-19 DIAGNOSIS — M1990 Unspecified osteoarthritis, unspecified site: Secondary | ICD-10-CM | POA: Diagnosis not present

## 2016-07-21 ENCOUNTER — Ambulatory Visit (INDEPENDENT_AMBULATORY_CARE_PROVIDER_SITE_OTHER): Payer: Medicare HMO | Admitting: Internal Medicine

## 2016-07-21 VITALS — BP 131/74 | HR 107 | Temp 97.8°F | Ht 64.0 in | Wt 151.8 lb

## 2016-07-21 DIAGNOSIS — E1142 Type 2 diabetes mellitus with diabetic polyneuropathy: Secondary | ICD-10-CM

## 2016-07-21 DIAGNOSIS — R6889 Other general symptoms and signs: Secondary | ICD-10-CM | POA: Diagnosis not present

## 2016-07-21 LAB — GLUCOSE, CAPILLARY: GLUCOSE-CAPILLARY: 550 mg/dL — AB (ref 65–99)

## 2016-07-21 LAB — POCT GLYCOSYLATED HEMOGLOBIN (HGB A1C): HEMOGLOBIN A1C: 9.1

## 2016-07-21 MED ORDER — INSULIN ASPART 100 UNIT/ML ~~LOC~~ SOLN: SUBCUTANEOUS | 5 refills | Status: DC

## 2016-07-21 NOTE — Patient Instructions (Addendum)
It was a pleasure to see you today [name].  I think you should decrease the insulin dose you are taking at the morning and dinner time doses. Take 5 units in the morning before breakfast, 10 units before lunch, and 5 units before dinner.  We will check a blood test today and make sure you are not getting too dehydrated.  I want to see you again in about 2 weeks and make sure things are going well with this plan.

## 2016-07-21 NOTE — Progress Notes (Signed)
   CC: Follow up for diabetes management  HPI:  Michael [name] is a 61 y.o. man here for follow up of his diabetes management.   See problem based assessment and plan below for additional details  Past Medical History:  Diagnosis Date  . Anxiety   . Arthritis    "joints; shoulders; feet" (06/08/2016)  . Bipolar disorder ([place])   . Daily headache    "7:30 - 8:00 q night" (06/08/2016)  . Diabetic peripheral neuropathy ([place])   . GERD (gastroesophageal reflux disease)   . Pancreatitis   . Schizophrenia ([place])   . Seizures ([place]) 01/2016 X 2  . Type II diabetes mellitus ([place])     Review of Systems:  Review of Systems  Constitutional: Negative for chills, fever and weight loss.  Eyes: Negative for blurred vision.  Respiratory: Negative for sputum production.   Cardiovascular: Negative for chest pain.  Gastrointestinal: Negative for abdominal pain.  Genitourinary: Positive for frequency.  Musculoskeletal: Positive for joint pain. Negative for falls.  Skin: Negative for rash.  Neurological: Negative for sensory change.  Endo/Heme/Allergies: Positive for polydipsia.    Physical Exam: Physical Exam  Constitutional: He is well-developed, well-nourished, and in no distress.  HENT:  Mouth/Throat: Oropharynx is clear and moist. No oropharyngeal exudate.  Cardiovascular: Regular rhythm.   No murmur heard. Mild tachycardia  Pulmonary/Chest: Effort normal and breath sounds normal.  Abdominal: Soft. There is no tenderness.  Musculoskeletal: He exhibits no edema.  Skin: Skin is warm and dry. No rash noted.  Psychiatric:  Pressured speech, tangential    Vitals:   07/21/16 1435  BP: 131/74  Pulse: (!) 107  Temp: 97.8 F (36.6 C)  TempSrc: Oral  SpO2: 100%  Weight: 151 lb 12.8 oz (68.9 kg)  Height: 5\' 4"  (1.626 m)    Assessment & Plan:   See Encounters Tab for problem based charting.  Patient discussed with Dr. Daryll Drown

## 2016-07-22 LAB — BMP8+ANION GAP
ANION GAP: 15 mmol/L (ref 10.0–18.0)
BUN / CREAT RATIO: 14 (ref 9–20)
BUN: 11 mg/dL (ref 6–24)
CALCIUM: 9.4 mg/dL (ref 8.7–10.2)
CHLORIDE: 96 mmol/L (ref 96–106)
CO2: 24 mmol/L (ref 20–29)
Creatinine, Ser: 0.78 mg/dL (ref 0.76–1.27)
GFR calc Af Amer: 115 mL/min/{1.73_m2} (ref >59)
GFR calc non Af Amer: 99 mL/min/{1.73_m2} (ref >59)
GLUCOSE: 483 mg/dL — AB (ref 65–99)
Potassium: 4.5 mmol/L (ref 3.5–5.2)
Sodium: 135 mmol/L (ref 134–144)

## 2016-07-24 NOTE — Assessment & Plan Note (Signed)
HPI: [name] has been recommended to take insulin 9 units TID before meal with no basal insulin due to risk of severe hypoglycemia and poor understanding of his diabetes treatment plan. He brought his glucometer today that demonstrated excessively large variations in home CBGs from 35 before breakfast up to 600 midday. He has some symptoms of hyperglycemia with urinary frequency and thirst. He does not report clear symptoms of hypoglycemia associated with these very low CBGs. His diet is extremely inconsistent sometimes drinking multiple sodas in a row, which he also reports doing right before this clinic visit. He has a history of complications with his glucose including admissions for metabolic acidosis as well as past hypoglycemia associated seizures in 2016. His CBG here is 550 with a Hgb A1c of 9.1%.  A: Poorly controlled type 2 diabetes He is extremely brittle which I believe is largely related to his severe pancreatic insufficiency 2/2 long term alcohol use in the past. It is difficult to recommend a plan because his understanding is poor and he reports different answer when asked the same questions about his home medication management repeatedly.  P: We will check a metabolic panel today and make sure he is not developing a significant AKI or metabolic acidosis from hyperglycemia I recommended he decrease morning and night time insulin to 5 units, and increase lunch time insulin to 10 units He will need close follow up in 1-2 weeks with his glucometer to assess his control on this plan

## 2016-07-24 NOTE — Progress Notes (Signed)
Internal Medicine Clinic Attending  Case discussed with Dr. Rice at the time of the visit.  We reviewed the resident's history and exam and pertinent patient test results.  I agree with the assessment, diagnosis, and plan of care documented in the resident's note.  

## 2016-07-25 DIAGNOSIS — I739 Peripheral vascular disease, unspecified: Secondary | ICD-10-CM | POA: Diagnosis not present

## 2016-07-25 DIAGNOSIS — E1142 Type 2 diabetes mellitus with diabetic polyneuropathy: Secondary | ICD-10-CM | POA: Diagnosis not present

## 2016-07-25 DIAGNOSIS — M4692 Unspecified inflammatory spondylopathy, cervical region: Secondary | ICD-10-CM | POA: Diagnosis not present

## 2016-07-25 DIAGNOSIS — K860 Alcohol-induced chronic pancreatitis: Secondary | ICD-10-CM | POA: Diagnosis not present

## 2016-07-25 DIAGNOSIS — F209 Schizophrenia, unspecified: Secondary | ICD-10-CM | POA: Diagnosis not present

## 2016-07-25 DIAGNOSIS — I10 Essential (primary) hypertension: Secondary | ICD-10-CM | POA: Diagnosis not present

## 2016-07-25 DIAGNOSIS — M170 Bilateral primary osteoarthritis of knee: Secondary | ICD-10-CM | POA: Diagnosis not present

## 2016-07-25 DIAGNOSIS — E512 Wernicke's encephalopathy: Secondary | ICD-10-CM | POA: Diagnosis not present

## 2016-07-25 DIAGNOSIS — F10151 Alcohol abuse with alcohol-induced psychotic disorder with hallucinations: Secondary | ICD-10-CM | POA: Diagnosis not present

## 2016-07-27 DIAGNOSIS — E1142 Type 2 diabetes mellitus with diabetic polyneuropathy: Secondary | ICD-10-CM | POA: Diagnosis not present

## 2016-07-27 DIAGNOSIS — M170 Bilateral primary osteoarthritis of knee: Secondary | ICD-10-CM | POA: Diagnosis not present

## 2016-07-27 DIAGNOSIS — I739 Peripheral vascular disease, unspecified: Secondary | ICD-10-CM | POA: Diagnosis not present

## 2016-07-27 DIAGNOSIS — E512 Wernicke's encephalopathy: Secondary | ICD-10-CM | POA: Diagnosis not present

## 2016-07-27 DIAGNOSIS — I10 Essential (primary) hypertension: Secondary | ICD-10-CM | POA: Diagnosis not present

## 2016-07-27 DIAGNOSIS — F209 Schizophrenia, unspecified: Secondary | ICD-10-CM | POA: Diagnosis not present

## 2016-07-27 DIAGNOSIS — F10151 Alcohol abuse with alcohol-induced psychotic disorder with hallucinations: Secondary | ICD-10-CM | POA: Diagnosis not present

## 2016-07-27 DIAGNOSIS — M4692 Unspecified inflammatory spondylopathy, cervical region: Secondary | ICD-10-CM | POA: Diagnosis not present

## 2016-07-27 DIAGNOSIS — K860 Alcohol-induced chronic pancreatitis: Secondary | ICD-10-CM | POA: Diagnosis not present

## 2016-08-01 DIAGNOSIS — E1142 Type 2 diabetes mellitus with diabetic polyneuropathy: Secondary | ICD-10-CM | POA: Diagnosis not present

## 2016-08-01 DIAGNOSIS — F10151 Alcohol abuse with alcohol-induced psychotic disorder with hallucinations: Secondary | ICD-10-CM | POA: Diagnosis not present

## 2016-08-01 DIAGNOSIS — E512 Wernicke's encephalopathy: Secondary | ICD-10-CM | POA: Diagnosis not present

## 2016-08-01 DIAGNOSIS — I10 Essential (primary) hypertension: Secondary | ICD-10-CM | POA: Diagnosis not present

## 2016-08-01 DIAGNOSIS — F209 Schizophrenia, unspecified: Secondary | ICD-10-CM | POA: Diagnosis not present

## 2016-08-01 DIAGNOSIS — K860 Alcohol-induced chronic pancreatitis: Secondary | ICD-10-CM | POA: Diagnosis not present

## 2016-08-01 DIAGNOSIS — M170 Bilateral primary osteoarthritis of knee: Secondary | ICD-10-CM | POA: Diagnosis not present

## 2016-08-01 DIAGNOSIS — M4692 Unspecified inflammatory spondylopathy, cervical region: Secondary | ICD-10-CM | POA: Diagnosis not present

## 2016-08-01 DIAGNOSIS — I739 Peripheral vascular disease, unspecified: Secondary | ICD-10-CM | POA: Diagnosis not present

## 2016-08-03 DIAGNOSIS — M4692 Unspecified inflammatory spondylopathy, cervical region: Secondary | ICD-10-CM | POA: Diagnosis not present

## 2016-08-03 DIAGNOSIS — K860 Alcohol-induced chronic pancreatitis: Secondary | ICD-10-CM | POA: Diagnosis not present

## 2016-08-03 DIAGNOSIS — I739 Peripheral vascular disease, unspecified: Secondary | ICD-10-CM | POA: Diagnosis not present

## 2016-08-03 DIAGNOSIS — M170 Bilateral primary osteoarthritis of knee: Secondary | ICD-10-CM | POA: Diagnosis not present

## 2016-08-03 DIAGNOSIS — E512 Wernicke's encephalopathy: Secondary | ICD-10-CM | POA: Diagnosis not present

## 2016-08-03 DIAGNOSIS — F209 Schizophrenia, unspecified: Secondary | ICD-10-CM | POA: Diagnosis not present

## 2016-08-03 DIAGNOSIS — E1142 Type 2 diabetes mellitus with diabetic polyneuropathy: Secondary | ICD-10-CM | POA: Diagnosis not present

## 2016-08-03 DIAGNOSIS — F10151 Alcohol abuse with alcohol-induced psychotic disorder with hallucinations: Secondary | ICD-10-CM | POA: Diagnosis not present

## 2016-08-03 DIAGNOSIS — I10 Essential (primary) hypertension: Secondary | ICD-10-CM | POA: Diagnosis not present

## 2016-08-04 ENCOUNTER — Encounter: Payer: Self-pay | Admitting: Internal Medicine

## 2016-08-04 ENCOUNTER — Ambulatory Visit (INDEPENDENT_AMBULATORY_CARE_PROVIDER_SITE_OTHER): Payer: Medicare HMO | Admitting: Internal Medicine

## 2016-08-04 VITALS — BP 142/70 | HR 96 | Temp 98.8°F | Ht 64.0 in | Wt 159.5 lb

## 2016-08-04 DIAGNOSIS — Z833 Family history of diabetes mellitus: Secondary | ICD-10-CM | POA: Diagnosis not present

## 2016-08-04 DIAGNOSIS — R6889 Other general symptoms and signs: Secondary | ICD-10-CM | POA: Diagnosis not present

## 2016-08-04 DIAGNOSIS — K529 Noninfective gastroenteritis and colitis, unspecified: Secondary | ICD-10-CM | POA: Diagnosis not present

## 2016-08-04 DIAGNOSIS — Z794 Long term (current) use of insulin: Secondary | ICD-10-CM | POA: Diagnosis not present

## 2016-08-04 DIAGNOSIS — F17210 Nicotine dependence, cigarettes, uncomplicated: Secondary | ICD-10-CM

## 2016-08-04 DIAGNOSIS — M170 Bilateral primary osteoarthritis of knee: Secondary | ICD-10-CM

## 2016-08-04 DIAGNOSIS — E1142 Type 2 diabetes mellitus with diabetic polyneuropathy: Secondary | ICD-10-CM

## 2016-08-04 DIAGNOSIS — E1165 Type 2 diabetes mellitus with hyperglycemia: Secondary | ICD-10-CM | POA: Diagnosis not present

## 2016-08-04 DIAGNOSIS — Z8249 Family history of ischemic heart disease and other diseases of the circulatory system: Secondary | ICD-10-CM

## 2016-08-04 LAB — GLUCOSE, CAPILLARY: GLUCOSE-CAPILLARY: 147 mg/dL — AB (ref 65–99)

## 2016-08-04 NOTE — Patient Instructions (Signed)
It was a pleasure to see you today Michael [name].  I am glad your sugars are doing better today after adjusting the mealtime insulin doses. Let's keep going with the current doses 5 units 10 units and 5 units with meals.  For your bilateral knee pain I am referring you to the orthopedic surgery clinic. They are joint experts and may offer a different treatment plan such as cartilage injection or knee surgery.  I do not think that strong oral pain medicine is a good long term plan for your joint pains. This is more likely to cause worsening imbalance or falls and loses effectiveness over time. This is the reason I will not recommend more pain medicine at this time.

## 2016-08-04 NOTE — Progress Notes (Signed)
   CC: Follow up for hyperglycemia and knee pain  HPI:  [name] is a 61 y.o. man here for close follow up of his symptomatic hyperglycemia.   See problem based assessment and plan below for additional details  Past Medical History:  Diagnosis Date  . Anxiety   . Arthritis    "joints; shoulders; feet" (06/08/2016)  . Bipolar disorder ([place])   . Daily headache    "7:30 - 8:00 q night" (06/08/2016)  . Diabetic peripheral neuropathy ([place])   . GERD (gastroesophageal reflux disease)   . Pancreatitis   . Schizophrenia ([place])   . Seizures ([place]) 01/2016 X 2  . Type II diabetes mellitus ([place])     Review of Systems:  Review of Systems  Constitutional: Negative for malaise/fatigue.  Eyes: Negative for blurred vision.  Cardiovascular: Negative for chest pain and leg swelling.  Gastrointestinal: Positive for diarrhea. Negative for constipation.  Musculoskeletal: Positive for back pain, joint pain, myalgias and neck pain.  Skin: Positive for itching. Negative for rash.  Neurological: Positive for dizziness. Negative for focal weakness.  Endo/Heme/Allergies: Negative for polydipsia.  Psychiatric/Behavioral: The patient has insomnia.     Physical Exam: Physical Exam  Constitutional:  Chronically ill appearing man  HENT:  Mouth/Throat: Oropharynx is clear and moist. No oropharyngeal exudate.  Cardiovascular: Normal rate and regular rhythm.   No murmur heard. Pulmonary/Chest: Effort normal and breath sounds normal. He has no rales.  Abdominal: Soft. There is no tenderness.  Musculoskeletal: He exhibits no edema.  Bilateral patellar crepitus on knee flexion, no varus or valgus deformity, no laxity to anterior or posterior movement  Skin: Skin is dry.  Psychiatric:  Pressured speech    Vitals:   08/04/16 1539  BP: (!) 142/70  Pulse: 96  Temp: 98.8 F (37.1 C)  TempSrc: Oral  SpO2: 100%  Weight: 159 lb 8 oz (72.3 kg)  Height: 5\' 4"  (1.626 m)    Assessment & Plan:    See Encounters Tab for problem based charting.  Patient discussed with Dr. Angelia Mould

## 2016-08-07 NOTE — Assessment & Plan Note (Signed)
HPI: He has changed his insulin regimen to 5 units, 10 units, and 5 units before meals daily. He did not bring his glucometer to this visit but his CBG here is 147 and he reports results of mostly low 200s when checking this at home. He still drinks a lot of fluid and visits the bathroom frequently, although this is in part due to his chronic diarrhea from pancreatic insufficiency.  A: Considering his brittle diabetes with many complications of hypoglycemia a relaxed A1c goal of 8% is probably safer than tight control to a target of 7%, and we can keep working on this less acutely now that symptoms are improved. He does not seem to have a good enough understanding of his diabetes and management to follow any more complicated regimen or sliding scale.  P: Continue TID insulin at reduced dose 5,10,5

## 2016-08-07 NOTE — Assessment & Plan Note (Signed)
HPI: He has chronic pain complaints in many joints from neck to knees but chronic knee pain has been an ongoing problem. He had partial benefit with physical therapy earlier this year but is not maintaining a lot of activity at home. Previous xrays were obtained in 01/2015 that already demonstrated tricompartment disease and the problem has not improved since that time.  A: He reports no good benefit to intraarticular steroid injection in the past and would likely worsen his moderately uncontrolled diabetes at this time. I think he is a very poor candidate for chronic opioid pain treatment due to accompanying psychiatric disease and past alcohol use. I think we should consider surgical evaluation as there are not a lot of other good long term options and his pain is a severe complaint.  P: Continue oral and topical NSAID treatment Continue duloxetine Referral to orthopedic surgery for evaluation

## 2016-08-08 NOTE — Progress Notes (Signed)
Internal Medicine Clinic Attending  Case discussed with Dr. Rice at the time of the visit.  We reviewed the resident's history and exam and pertinent patient test results.  I agree with the assessment, diagnosis, and plan of care documented in the resident's note.  

## 2016-08-09 ENCOUNTER — Other Ambulatory Visit: Payer: Self-pay | Admitting: Internal Medicine

## 2016-08-09 DIAGNOSIS — E1142 Type 2 diabetes mellitus with diabetic polyneuropathy: Secondary | ICD-10-CM

## 2016-08-16 ENCOUNTER — Encounter: Payer: Self-pay | Admitting: *Deleted

## 2016-08-16 ENCOUNTER — Other Ambulatory Visit: Payer: Self-pay | Admitting: Internal Medicine

## 2016-08-16 MED ORDER — ARIPIPRAZOLE 5 MG PO TABS: 5.0000 mg | ORAL_TABLET | Freq: Every day | ORAL | 2 refills | Status: DC

## 2016-08-16 NOTE — Telephone Encounter (Signed)
Refill Request   ARIPiprazole (ABILIFY) 5 MG tablet

## 2016-08-17 ENCOUNTER — Other Ambulatory Visit: Payer: Self-pay | Admitting: *Deleted

## 2016-08-17 DIAGNOSIS — E1142 Type 2 diabetes mellitus with diabetic polyneuropathy: Secondary | ICD-10-CM

## 2016-08-18 ENCOUNTER — Other Ambulatory Visit: Payer: Self-pay | Admitting: *Deleted

## 2016-08-18 MED ORDER — BD SWAB SINGLE USE REGULAR PADS: MEDICATED_PAD | 3 refills | Status: DC

## 2016-08-18 MED ORDER — PANTOPRAZOLE SODIUM 40 MG PO TBEC: 40.0000 mg | DELAYED_RELEASE_TABLET | Freq: Every day | ORAL | 1 refills | Status: DC

## 2016-08-18 MED ORDER — HYDROXYZINE HCL 10 MG PO TABS: 10.0000 mg | ORAL_TABLET | Freq: Three times a day (TID) | ORAL | 5 refills | Status: DC | PRN

## 2016-08-18 MED ORDER — ACCU-CHEK SOFTCLIX LANCETS MISC: 3 refills | Status: DC

## 2016-08-18 MED ORDER — ACCU-CHEK AVIVA PLUS W/DEVICE KIT: 1.0000 | PACK | Freq: Every morning | 0 refills | Status: DC

## 2016-08-18 MED ORDER — PRAVASTATIN SODIUM 10 MG PO TABS: 10.0000 mg | ORAL_TABLET | Freq: Every day | ORAL | 0 refills | Status: DC

## 2016-08-21 ENCOUNTER — Encounter: Payer: Self-pay | Admitting: *Deleted

## 2016-08-22 ENCOUNTER — Telehealth: Payer: Self-pay | Admitting: Internal Medicine

## 2016-08-22 NOTE — Telephone Encounter (Signed)
   Reason for call:   I received a call from [name] at 5:16 PM indicating that he needs refills on his medications. He reports he needs refills on his protonix, pravastatin, ibuprofen and testing strips. Requests Rx sent to [facility]. It appears he has had recent refills sent in to a different mail order pharmacy. Will sent to PCP to clarify. Discussed that he should call the clinic during normal business hours for refill requests and the after hours pager is for emergencies only.    Pertinent Data:   Refill Request   Assessment / Plan / Recommendations:   As always, pt is advised that if symptoms worsen or new symptoms arise, they should go to an urgent care facility or to to ER for further evaluation.   Maryellen Pile, MD   08/22/2016, 5:59 PM

## 2016-08-23 ENCOUNTER — Other Ambulatory Visit: Payer: Self-pay | Admitting: *Deleted

## 2016-08-24 ENCOUNTER — Other Ambulatory Visit: Payer: Self-pay | Admitting: *Deleted

## 2016-08-24 ENCOUNTER — Ambulatory Visit: Payer: Medicare HMO | Admitting: Podiatry

## 2016-08-24 MED ORDER — IBUPROFEN 800 MG PO TABS: 800.0000 mg | ORAL_TABLET | Freq: Three times a day (TID) | ORAL | 1 refills | Status: DC | PRN

## 2016-08-24 MED ORDER — BD SWAB SINGLE USE REGULAR PADS: MEDICATED_PAD | 3 refills | Status: DC

## 2016-08-25 MED ORDER — ACCU-CHEK AVIVA VI SOLN: 1.0000 [drp] | 0 refills | Status: DC | PRN

## 2016-08-29 ENCOUNTER — Ambulatory Visit (INDEPENDENT_AMBULATORY_CARE_PROVIDER_SITE_OTHER): Payer: Medicare HMO | Admitting: Orthopaedic Surgery

## 2016-08-29 DIAGNOSIS — R6889 Other general symptoms and signs: Secondary | ICD-10-CM | POA: Diagnosis not present

## 2016-09-05 ENCOUNTER — Ambulatory Visit (HOSPITAL_COMMUNITY)
Admission: RE | Admit: 2016-09-05 | Discharge: 2016-09-05 | Disposition: A | Discharge status: Home / Self Care | Payer: Medicare HMO | Source: Home / Self Care | Attending: Psychiatry | Admitting: Psychiatry

## 2016-09-05 ENCOUNTER — Emergency Department (HOSPITAL_COMMUNITY)
Admission: EM | Admit: 2016-09-05 | Discharge: 2016-09-06 | Disposition: A | Discharge status: Home / Self Care | Payer: Medicare HMO | Attending: Emergency Medicine | Admitting: Emergency Medicine

## 2016-09-05 ENCOUNTER — Encounter (HOSPITAL_COMMUNITY): Payer: Self-pay

## 2016-09-05 DIAGNOSIS — E119 Type 2 diabetes mellitus without complications: Secondary | ICD-10-CM | POA: Insufficient documentation

## 2016-09-05 DIAGNOSIS — R45850 Homicidal ideations: Secondary | ICD-10-CM | POA: Diagnosis not present

## 2016-09-05 DIAGNOSIS — E1042 Type 1 diabetes mellitus with diabetic polyneuropathy: Secondary | ICD-10-CM

## 2016-09-05 DIAGNOSIS — F250 Schizoaffective disorder, bipolar type: Secondary | ICD-10-CM

## 2016-09-05 DIAGNOSIS — Z79899 Other long term (current) drug therapy: Secondary | ICD-10-CM | POA: Diagnosis not present

## 2016-09-05 DIAGNOSIS — F1290 Cannabis use, unspecified, uncomplicated: Secondary | ICD-10-CM | POA: Diagnosis not present

## 2016-09-05 DIAGNOSIS — Z794 Long term (current) use of insulin: Secondary | ICD-10-CM | POA: Insufficient documentation

## 2016-09-05 DIAGNOSIS — E1010 Type 1 diabetes mellitus with ketoacidosis without coma: Secondary | ICD-10-CM | POA: Diagnosis not present

## 2016-09-05 DIAGNOSIS — F319 Bipolar disorder, unspecified: Secondary | ICD-10-CM | POA: Diagnosis present

## 2016-09-05 DIAGNOSIS — R451 Restlessness and agitation: Secondary | ICD-10-CM | POA: Insufficient documentation

## 2016-09-05 DIAGNOSIS — F17210 Nicotine dependence, cigarettes, uncomplicated: Secondary | ICD-10-CM | POA: Diagnosis not present

## 2016-09-05 DIAGNOSIS — F419 Anxiety disorder, unspecified: Secondary | ICD-10-CM | POA: Diagnosis not present

## 2016-09-05 DIAGNOSIS — F22 Delusional disorders: Secondary | ICD-10-CM | POA: Diagnosis not present

## 2016-09-05 LAB — COMPREHENSIVE METABOLIC PANEL
ALBUMIN: 3.7 g/dL (ref 3.5–5.0)
ALT: 33 U/L (ref 17–63)
AST: 21 U/L (ref 15–41)
Alkaline Phosphatase: 141 U/L — ABNORMAL HIGH (ref 38–126)
Anion gap: 12 (ref 5–15)
BUN: 6 mg/dL (ref 6–20)
CHLORIDE: 103 mmol/L (ref 101–111)
CO2: 20 mmol/L — ABNORMAL LOW (ref 22–32)
CREATININE: 0.76 mg/dL (ref 0.61–1.24)
Calcium: 8.9 mg/dL (ref 8.9–10.3)
GFR calc Af Amer: >60 mL/min (ref >60)
GLUCOSE: 277 mg/dL — AB (ref 65–99)
Potassium: 3.4 mmol/L — ABNORMAL LOW (ref 3.5–5.1)
Sodium: 135 mmol/L (ref 135–145)
Total Bilirubin: 0.4 mg/dL (ref 0.3–1.2)
Total Protein: 6.5 g/dL (ref 6.5–8.1)

## 2016-09-05 LAB — CBC
HCT: 43.1 % (ref 39.0–52.0)
HEMOGLOBIN: 15.6 g/dL (ref 13.0–17.0)
MCH: 32.6 pg (ref 26.0–34.0)
MCHC: 36.2 g/dL — AB (ref 30.0–36.0)
MCV: 90 fL (ref 78.0–100.0)
Platelets: 182 10*3/uL (ref 150–400)
RBC: 4.79 MIL/uL (ref 4.22–5.81)
RDW: 12.7 % (ref 11.5–15.5)
WBC: 9.8 10*3/uL (ref 4.0–10.5)

## 2016-09-05 LAB — ETHANOL: ALCOHOL ETHYL (B): 44 mg/dL — AB (ref <5)
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
## 2016-09-05 NOTE — ED Triage Notes (Signed)
Pt was sent over here for medical clearance from [facility] Pt has talked to [place] at [facility]

## 2016-09-05 NOTE — ED Notes (Signed)
Bed: [facility] Expected date:  Expected time:  Means of arrival:  Comments: 
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
## 2016-09-06 DIAGNOSIS — R45850 Homicidal ideations: Secondary | ICD-10-CM

## 2016-09-06 DIAGNOSIS — F1290 Cannabis use, unspecified, uncomplicated: Secondary | ICD-10-CM | POA: Diagnosis not present

## 2016-09-06 DIAGNOSIS — F419 Anxiety disorder, unspecified: Secondary | ICD-10-CM | POA: Diagnosis not present

## 2016-09-06 DIAGNOSIS — F319 Bipolar disorder, unspecified: Secondary | ICD-10-CM

## 2016-09-06 DIAGNOSIS — F22 Delusional disorders: Secondary | ICD-10-CM | POA: Diagnosis not present

## 2016-09-06 DIAGNOSIS — F17210 Nicotine dependence, cigarettes, uncomplicated: Secondary | ICD-10-CM | POA: Diagnosis not present

## 2016-09-06 LAB — URINALYSIS, ROUTINE W REFLEX MICROSCOPIC
BILIRUBIN URINE: NEGATIVE
Glucose, UA: >=500 mg/dL — AB
HGB URINE DIPSTICK: NEGATIVE
Ketones, ur: 20 mg/dL — AB
Nitrite: NEGATIVE
PH: 5 (ref 5.0–8.0)
Protein, ur: NEGATIVE mg/dL
SPECIFIC GRAVITY, URINE: 1.024 (ref 1.005–1.030)

## 2016-09-06 LAB — RAPID URINE DRUG SCREEN, HOSP PERFORMED
Amphetamines: NOT DETECTED
BARBITURATES: NOT DETECTED
Benzodiazepines: NOT DETECTED
Cocaine: NOT DETECTED
Opiates: NOT DETECTED
Tetrahydrocannabinol: NOT DETECTED

## 2016-09-06 LAB — CBG MONITORING, ED: Glucose-Capillary: 348 mg/dL — ABNORMAL HIGH (ref 65–99)

## 2016-09-06 MED ORDER — DICLOFENAC SODIUM 1 % TD GEL
2.0000 g | Freq: Three times a day (TID) | TRANSDERMAL | Status: DC
Start: 2016-09-06 — End: 2016-09-06
  Filled 2016-09-06 (×2): qty 100

## 2016-09-06 MED ORDER — LORAZEPAM 2 MG/ML IJ SOLN: 0.0000 mg | Freq: Four times a day (QID) | INTRAMUSCULAR | Status: DC

## 2016-09-06 MED ORDER — LORAZEPAM 2 MG/ML IJ SOLN: 0.0000 mg | Freq: Two times a day (BID) | INTRAMUSCULAR | Status: DC

## 2016-09-06 MED ORDER — PANCRELIPASE (LIP-PROT-AMYL) 36000-114000 UNITS PO CPEP
36000.0000 [IU] | ORAL_CAPSULE | Freq: Three times a day (TID) | ORAL | Status: DC
  Administered 2016-09-06: 36000 [IU] via ORAL
  Filled 2016-09-06 (×2): qty 1

## 2016-09-06 MED ORDER — PRAVASTATIN SODIUM 10 MG PO TABS: 10.0000 mg | ORAL_TABLET | Freq: Every day | ORAL | Status: DC

## 2016-09-06 MED ORDER — INSULIN ASPART 100 UNIT/ML ~~LOC~~ SOLN: 15.0000 [IU] | Freq: Every day | SUBCUTANEOUS | Status: DC

## 2016-09-06 MED ORDER — INSULIN ASPART 100 UNIT/ML ~~LOC~~ SOLN: 10.0000 [IU] | Freq: Every day | SUBCUTANEOUS | Status: DC

## 2016-09-06 MED ORDER — NICOTINE 21 MG/24HR TD PT24
21.0000 mg | MEDICATED_PATCH | Freq: Every day | TRANSDERMAL | Status: DC
  Administered 2016-09-06: 21 mg via TRANSDERMAL
  Filled 2016-09-06: qty 1

## 2016-09-06 MED ORDER — PANTOPRAZOLE SODIUM 40 MG PO TBEC
40.0000 mg | DELAYED_RELEASE_TABLET | Freq: Every day | ORAL | Status: DC
  Administered 2016-09-06: 40 mg via ORAL
  Filled 2016-09-06: qty 1

## 2016-09-06 MED ORDER — ALUM & MAG HYDROXIDE-SIMETH 200-200-20 MG/5ML PO SUSP: 30.0000 mL | Freq: Four times a day (QID) | ORAL | Status: DC | PRN

## 2016-09-06 MED ORDER — DULOXETINE HCL 30 MG PO CPEP
60.0000 mg | ORAL_CAPSULE | Freq: Every day | ORAL | Status: DC
  Administered 2016-09-06: 60 mg via ORAL
  Filled 2016-09-06: qty 2

## 2016-09-06 MED ORDER — THIAMINE HCL 100 MG/ML IJ SOLN: 100.0000 mg | Freq: Every day | INTRAMUSCULAR | Status: DC

## 2016-09-06 MED ORDER — VITAMIN B-1 100 MG PO TABS
100.0000 mg | ORAL_TABLET | Freq: Every day | ORAL | Status: DC
  Filled 2016-09-06 (×2): qty 1

## 2016-09-06 MED ORDER — FOLIC ACID 1 MG PO TABS
1.0000 mg | ORAL_TABLET | Freq: Every day | ORAL | Status: DC
  Administered 2016-09-06: 1 mg via ORAL
  Filled 2016-09-06: qty 1

## 2016-09-06 MED ORDER — POTASSIUM CHLORIDE CRYS ER 20 MEQ PO TBCR
40.0000 meq | EXTENDED_RELEASE_TABLET | Freq: Once | ORAL | Status: AC
  Administered 2016-09-06: 40 meq via ORAL
  Filled 2016-09-06: qty 2

## 2016-09-06 MED ORDER — LORAZEPAM 1 MG PO TABS
0.0000 mg | ORAL_TABLET | Freq: Four times a day (QID) | ORAL | Status: DC
  Filled 2016-09-06: qty 1

## 2016-09-06 MED ORDER — INSULIN ASPART 100 UNIT/ML ~~LOC~~ SOLN
5.0000 [IU] | Freq: Every day | SUBCUTANEOUS | Status: DC
  Administered 2016-09-06: 5 [IU] via SUBCUTANEOUS
  Filled 2016-09-06: qty 1

## 2016-09-06 MED ORDER — GABAPENTIN 300 MG PO CAPS
900.0000 mg | ORAL_CAPSULE | Freq: Two times a day (BID) | ORAL | Status: DC
  Administered 2016-09-06: 900 mg via ORAL
  Filled 2016-09-06: qty 3

## 2016-09-06 MED ORDER — LORAZEPAM 1 MG PO TABS: 0.0000 mg | ORAL_TABLET | Freq: Two times a day (BID) | ORAL | Status: DC

## 2016-09-06 NOTE — ED Notes (Signed)
Pt left ED prior to receiving discharge papers and education; prior to RN obtaining discharge vital signs or signature. Pt also had medications pending, but left ED prior to receiving final medications.

## 2016-09-06 NOTE — Progress Notes (Signed)
Inpatient Diabetes Program Recommendations  AACE/ADA: New Consensus Statement on Inpatient Glycemic Control (2015)  Target Ranges:  Prepandial:   less than 140 mg/dL      Peak postprandial:   less than 180 mg/dL (1-2 hours)      Critically ill patients:  140 - 180 mg/dL   Lab Results  Component Value Date   GLUCAP 348 (H) 09/06/2016   HGBA1C 9.1 07/21/2016    Review of Glycemic Control  Diabetes history: DM2 Outpatient Diabetes medications: Novolog 06-01-13 units tidwc Current orders for Inpatient glycemic control: same as above  HgbA1C - 9.1% - sub-par glucose control.  Inpatient Diabetes Program Recommendations:    Add Lantus 10 units QHS Add Novolog 0-15 units tidwc and hs D/C Novolog 10 units at Lunch and 15 units at [employer] 5 units tidwc. Will titrate if post-prandial blood sugars > 180 mg/dL.  Will continue to follow if admitted to [facility].  Thank you. Lorenda Peck, RD, LDN, CDE Inpatient Diabetes Coordinator [phone]

## 2016-09-06 NOTE — Consult Note (Signed)
[place] [facility] Consult   Reason for Consult:  Homicidal ideation, passive Referring Physician:  EDP Patient Identification: Michael [name] MRN:  [id number] Principal Diagnosis: Bipolar disorder [facility]) Diagnosis:   Patient Active Problem List   Diagnosis Date Noted  . Recurrent falls [R29.6] 07/07/2016  . Falls [id number]] 06/18/2016  . Wernicke's encephalopathy [E51.2]   . Altered mental status [R41.82] 06/09/2016  . Bipolar disorder ([name]) [F31.9] 06/01/2016  . Diabetes mellitus due to underlying condition with ketoacidosis without coma, with long-term current use of insulin ([place]) [E08.10, Z79.4] 02/04/2016  . Dry skin [L85.3] 01/18/2016  . Peripheral vascular disease ([place]) [I73.9] 11/15/2015  . Tobacco abuse [Z72.0] 09/02/2015  . Tricompartment osteoarthritis of both knees [M17.0] 06/15/2015  . Chronic abdominal pain [R10.9, G89.29] 03/14/2015  . Vitamin D deficiency [E55.9] 03/12/2015  . Hyperlipidemia [E78.5] 02/17/2015  . Fall [W19.XXXA] 12/24/2014  . Protein-calorie malnutrition, severe [E43] 11/18/2014  . Alcohol use disorder ([place]) [F10.99]   . Cervical arthritis ([place]) [M46.92] 01/01/2014  . Health care maintenance [Z00.00] 08/25/2011  . Chronic alcoholic pancreatitis ([place]) [K86.0] 04/20/2011  . Type 2 diabetes mellitus with peripheral neuropathy ([place]) [E11.42] 12/09/2010  . HTN (hypertension) [I10] 12/09/2010    Total Time spent with patient: 45 minutes  Subjective:   Michael [name] is a 61 y.o. male patient admitted with passive homicidal ideation against his sister's boyfriend.  HPI:  Pt was seen by Dr Darleene Cleaver and this clinician, chart reviewed. Pt is labile with pressured speech. Pt stated he doesn't like living with his sister but doesn't want to go to a shelter. Pt stated he goes to [facility] for his medications. Pt was angry, anxious and irritable and just wanted to be discharged. Pt's BAL 44, UDS negative. Pt will be discharged with instructions to follow  up with [facility] and resources for shelters if he decides to use them. Pt is well known to this hospital system and frequently presents with the same or similar problem. Pt appears to be at baseline and is psychiatrically cleared for discharge.   Past Psychiatric History: As above  Risk to Self: Is patient at risk for suicide?: No Risk to Others:   Prior Inpatient Therapy:   Prior Outpatient Therapy:    Past Medical History:  Past Medical History:  Diagnosis Date  . Anxiety   . Arthritis    "joints; shoulders; feet" (06/08/2016)  . Bipolar disorder ([place])   . Daily headache    "7:30 - 8:00 q night" (06/08/2016)  . Diabetic peripheral neuropathy ([place])   . GERD (gastroesophageal reflux disease)   . Pancreatitis   . Schizophrenia ([place])   . Seizures ([place]) 01/2016 X 2  . Type II diabetes mellitus ([place])     Past Surgical History:  Procedure Laterality Date  . BALLOON DILATION  03/22/2011   Procedure: BALLOON DILATION;  Surgeon: Gatha Mayer, MD;  Location: [facility];  Service: Endoscopy;  Laterality: N/A;  . COLONOSCOPY N/A 05/22/2012   Procedure: COLONOSCOPY;  Surgeon: Gatha Mayer, MD;  Location: [facility];  Service: Endoscopy;  Laterality: N/A;  . COLONOSCOPY W/ BIOPSIES AND POLYPECTOMY  09/12/11  . ESOPHAGOGASTRODUODENOSCOPY  03/22/2011   Procedure: ESOPHAGOGASTRODUODENOSCOPY (EGD);  Surgeon: Gatha Mayer, MD;  Location: [facility];  Service: Endoscopy;  Laterality: N/A;  egd with balloon   . EUS  04/20/2011   Procedure: UPPER ENDOSCOPIC ULTRASOUND (EUS) LINEAR;  Surgeon: Milus Banister, MD;  Location: [facility];  Service: Endoscopy;  Laterality: N/A;  . KNEE ARTHROSCOPY  Left    Family History:  Family History  Problem Relation Age of Onset  . Hypertension Sister   . Diabetes Sister   . Heart disease Sister   . Colon cancer Neg Hx   . Stomach cancer Neg Hx   . Anesthesia problems Neg Hx   . Hypotension Neg Hx   . Pseudochol deficiency Neg Hx   . Malignant  hyperthermia Neg Hx    Family Psychiatric  History: Unknown Social History:  History  Alcohol Use  . 36.0 oz/week  . 50 Shots of liquor, 10 Standard drinks or equivalent per week    Comment: Occasionally.     History  Drug Use  . Types: Marijuana    Comment: 06/08/2016  "maybe once/week"    Social History   Social History  . Marital status: Single    Spouse name: N/A  . Number of children: 0  . Years of education: 67   Occupational History  .    [employer]    worked for 6 years  . 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 Smoking status: Current Every Day Smoker    Packs/day: 0.25    Years: 42.00    Types: Cigarettes  . Smokeless tobacco: Never Used     Comment: 5 cigs per day  . Alcohol use 36.0 oz/week    50 Shots of liquor, 10 Standard drinks or equivalent per week     Comment: Occasionally.  . Drug use: Yes    Types: Marijuana     Comment: 06/08/2016  "maybe once/week"  . Sexual activity: Yes   Other Topics Concern  . None   Social History Narrative   Has been living at 3 different friend's homes since discharge 01/2011. Was living in the shelter prior to admission. Had previously lived with his niece and other family members but he has gotten kicked out each time.       06/10/16 -pt reports lives w/sister and nieces   Additional Social History:    Allergies:   Allergies  Allergen Reactions  . Ace Inhibitors Swelling and Other (See Comments)    Angioedema - unquestionable  . Losartan Swelling and Other (See Comments)    Pt presented with unquestionable angioedema on ACEIs (Angiotensin-converting enzyme (ACE) inhibitors) so aviod ARBs (Angiotensin receptor blockers), as well  . Tylenol [Acetaminophen] Other (See Comments)    "cannot have this" (contraindicated)    Labs:  Results for orders placed or performed during the hospital encounter of 09/05/16 (from the past 48 hour(s))   Comprehensive metabolic panel     Status: Abnormal   Collection Time: 09/05/16 11:26 PM  Result Value Ref Range   Sodium 135 135 - 145 mmol/L   Potassium 3.4 (L) 3.5 - 5.1 mmol/L   Chloride 103 101 - 111 mmol/L   CO2 20 (L) 22 - 32 mmol/L   Glucose, Bld 277 (H) 65 - 99 mg/dL   BUN 6 6 - 20 mg/dL   Creatinine, Ser 0.76 0.61 - 1.24 mg/dL   Calcium 8.9 8.9 - 10.3 mg/dL   Total Protein 6.5 6.5 - 8.1 g/dL   Albumin 3.7 3.5 - 5.0 g/dL   AST 21 15 - 41 U/L   ALT 33 17 - 63 U/L   Alkaline Phosphatase 141 (H) 38 - 126 U/L   Total Bilirubin 0.4 0.3 - 1.2 mg/dL   GFR calc  non Af Amer >60 >60 mL/min   GFR calc Af Amer >60 >60 mL/min    Comment: (NOTE) The eGFR has been calculated using the CKD EPI equation. This calculation has not been validated in all clinical situations. eGFR's persistently <60 mL/min signify possible Chronic Kidney Disease.    Anion gap 12 5 - 15  Ethanol     Status: Abnormal   Collection Time: 09/05/16 11:26 PM  Result Value Ref Range   Alcohol, Ethyl (B) 44 (H) <5 mg/dL    Comment:        LOWEST DETECTABLE LIMIT FOR SERUM ALCOHOL IS 5 mg/dL FOR MEDICAL PURPOSES ONLY   cbc     Status: Abnormal   Collection Time: 09/05/16 11:26 PM  Result Value Ref Range   WBC 9.8 4.0 - 10.5 K/uL   RBC 4.79 4.22 - 5.81 MIL/uL   Hemoglobin 15.6 13.0 - 17.0 g/dL   HCT 43.1 39.0 - 52.0 %   MCV 90.0 78.0 - 100.0 fL   MCH 32.6 26.0 - 34.0 pg   MCHC 36.2 (H) 30.0 - 36.0 g/dL   RDW 12.7 11.5 - 15.5 %   Platelets 182 150 - 400 K/uL  Rapid urine drug screen (hospital performed)     Status: None   Collection Time: 09/06/16  9:18 AM  Result Value Ref Range   Opiates NONE DETECTED NONE DETECTED   Cocaine NONE DETECTED NONE DETECTED   Benzodiazepines NONE DETECTED NONE DETECTED   Amphetamines NONE DETECTED NONE DETECTED   Tetrahydrocannabinol NONE DETECTED NONE DETECTED   Barbiturates NONE DETECTED NONE DETECTED    Comment:        DRUG SCREEN FOR MEDICAL PURPOSES ONLY.  IF  CONFIRMATION IS NEEDED FOR ANY PURPOSE, NOTIFY LAB WITHIN 5 DAYS.        LOWEST DETECTABLE LIMITS FOR URINE DRUG SCREEN Drug Class       Cutoff (ng/mL) Amphetamine      1000 Barbiturate      200 Benzodiazepine   937 Tricyclics       902 Opiates          300 Cocaine          300 THC              50   Urinalysis, Routine w reflex microscopic     Status: Abnormal   Collection Time: 09/06/16  9:18 AM  Result Value Ref Range   Color, Urine YELLOW YELLOW   APPearance HAZY (A) CLEAR   Specific Gravity, Urine 1.024 1.005 - 1.030   pH 5.0 5.0 - 8.0   Glucose, UA >=500 (A) NEGATIVE mg/dL   Hgb urine dipstick NEGATIVE NEGATIVE   Bilirubin Urine NEGATIVE NEGATIVE   Ketones, ur 20 (A) NEGATIVE mg/dL   Protein, ur NEGATIVE NEGATIVE mg/dL   Nitrite NEGATIVE NEGATIVE   Leukocytes, UA MODERATE (A) NEGATIVE   RBC / HPF 6-30 0 - 5 RBC/hpf   WBC, UA 6-30 0 - 5 WBC/hpf   Bacteria, UA RARE (A) NONE SEEN   Squamous Epithelial / LPF 0-5 (A) NONE SEEN   Trichomonas, UA PRESENT    Sperm, UA PRESENT   CBG monitoring, ED     Status: Abnormal   Collection Time: 09/06/16 10:28 AM  Result Value Ref Range   Glucose-Capillary 348 (H) 65 - 99 mg/dL    Current Facility-Administered Medications  Medication Dose Route Frequency Provider Last Rate Last Dose  . alum & mag hydroxide-simeth (MAALOX/MYLANTA) 200-200-20 MG/5ML suspension 30  mL  30 mL Oral Q6H PRN Muthersbaugh, Hannah, PA-C      . diclofenac sodium (VOLTAREN) 1 % transdermal gel 2 g  2 g Topical TID AC & HS Muthersbaugh, Hannah, PA-C      . DULoxetine (CYMBALTA) DR capsule 60 mg  60 mg Oral Daily Muthersbaugh, Hannah, PA-C   60 mg at 09/06/16 1047  . folic acid (FOLVITE) tablet 1 mg  1 mg Oral Daily Muthersbaugh, Hannah, PA-C   1 mg at 09/06/16 1047  . gabapentin (NEURONTIN) capsule 900 mg  900 mg Oral BID Muthersbaugh, Jarrett Soho, PA-C   900 mg at 09/06/16 1047  . insulin aspart (novoLOG) injection 10 Units  10 Units Subcutaneous QAC lunch  Muthersbaugh, Hannah, PA-C      . insulin aspart (novoLOG) injection 15 Units  15 Units Subcutaneous QHS Muthersbaugh, Hannah, PA-C      . insulin aspart (novoLOG) injection 5 Units  5 Units Subcutaneous QAC breakfast Muthersbaugh, Jarrett Soho, PA-C   5 Units at 09/06/16 1046  . lipase/protease/amylase (CREON) capsule 36,000 Units  36,000 Units Oral TID WC Muthersbaugh, Hannah, PA-C   36,000 Units at 09/06/16 1047  . LORazepam (ATIVAN) injection 0-4 mg  0-4 mg Intravenous Q6H Muthersbaugh, Hannah, PA-C       Or  . LORazepam (ATIVAN) tablet 0-4 mg  0-4 mg Oral Q6H Muthersbaugh, Hannah, PA-C      . [START ON 09/08/2016] LORazepam (ATIVAN) injection 0-4 mg  0-4 mg Intravenous Q12H Muthersbaugh, Hannah, PA-C       Or  . Derrill Memo ON 09/08/2016] LORazepam (ATIVAN) tablet 0-4 mg  0-4 mg Oral Q12H Muthersbaugh, Hannah, PA-C      . nicotine (NICODERM CQ - dosed in mg/24 hours) patch 21 mg  21 mg Transdermal Daily Muthersbaugh, Hannah, PA-C   21 mg at 09/06/16 1050  . pantoprazole (PROTONIX) EC tablet 40 mg  40 mg Oral Daily Muthersbaugh, Hannah, PA-C   40 mg at 09/06/16 1047  . pravastatin (PRAVACHOL) tablet 10 mg  10 mg Oral q1800 Muthersbaugh, Hannah, PA-C      . thiamine (VITAMIN B-1) tablet 100 mg  100 mg Oral Daily Muthersbaugh, Hannah, PA-C       Or  . thiamine (B-1) injection 100 mg  100 mg Intravenous Daily Muthersbaugh, Jarrett Soho, PA-C       Current Outpatient Prescriptions  Medication Sig Dispense Refill  . diclofenac sodium (VOLTAREN) 1 % GEL Apply 2 g topically 4 (four) times daily -  before meals and at bedtime. 100 g 0  . DULoxetine (CYMBALTA) 60 MG capsule TAKE 1 CAPSULE EVERY DAY 90 capsule 1  . folic acid (FOLVITE) 1 MG tablet Take 1 tablet (1 mg total) by mouth daily. 30 tablet 0  . gabapentin (NEURONTIN) 300 MG capsule TAKE 3 CAPSULES BY MOUTH  EVERY MORNING AND  AFTERNOON. TAKE 4 CAPSULES  BY MOUTH AT NIGHT BEFORE  BED 900 capsule 1  . hydrOXYzine (ATARAX/VISTARIL) 10 MG tablet Take 1 tablet  (10 mg total) by mouth 3 (three) times daily as needed for itching. 30 tablet 5  . insulin aspart (NOVOLOG) 100 UNIT/ML injection Inject 5 units under the skin before breakfast and before dinner. Inject 10 units under the skin before lunch time (Patient taking differently: Inject 5 units under the skin before breakfast and 15 units before dinner. Inject 10 units under the skin before lunch time) 10 mL 5  . lipase/protease/amylase (CREON) 36000 UNITS CPEP capsule TAKE 1 CAPSULE BY MOUTH 3  TIMES DAILY  BEFORE MEALS 300 capsule 1  . Multiple Vitamin (MULTIVITAMIN WITH MINERALS) TABS tablet Take 1 tablet by mouth daily. 30 tablet 0  . omega-3 acid ethyl esters (LOVAZA) 1 g capsule Take 1 g by mouth daily.     . pantoprazole (PROTONIX) 40 MG tablet Take 1 tablet (40 mg total) by mouth daily. 90 tablet 1  . pravastatin (PRAVACHOL) 10 MG tablet Take 1 tablet (10 mg total) by mouth daily at 6 PM. 90 tablet 0  . ACCU-CHEK SOFTCLIX LANCETS lancets Use 3 times daily to check blood sugar. diag code E11.42. Insulin dependent 100 each 3  . Alcohol Swabs (B-D SINGLE USE SWABS REGULAR) PADS Patient uses 6 times a day. Patient tests 3 times per day and administers insulin 3 times a day. ICD 10 code E11.42 100 each 3  . ARIPiprazole (ABILIFY) 5 MG tablet Take 1 tablet (5 mg total) by mouth daily. 30 tablet 2  . Blood Glucose Calibration (ACCU-CHEK AVIVA) SOLN 1 drop by In Vitro route as needed. diag code E08.10 insulin dependent. Use as device directs 1 each 0  . Blood Glucose Monitoring Suppl (ACCU-CHEK AVIVA PLUS) w/Device KIT 1 kit by Does not apply route every morning. 1 kit 0  . COMFORT EZ PEN NEEDLES 31G X 5 MM MISC     . glucose blood (ACCU-CHEK AVIVA PLUS) test strip Use 3 times daily to check blood sugar. diag code E11.42. Insulin dependent 100 each 12  . ibuprofen (ADVIL,MOTRIN) 800 MG tablet Take 1 tablet (800 mg total) by mouth 3 (three) times daily as needed. 30 tablet 1  . thiamine (VITAMIN B-1) 100 MG  tablet Take 1 tablet (100 mg total) by mouth daily. (Patient not taking: Reported on 09/05/2016) 30 tablet 2  . UNABLE TO FIND Med Name: Med pass 120 mL by mouth 3 times daily      Musculoskeletal: Strength & Muscle Tone: within normal limits Gait & Station: normal Patient leans: N/A  Psychiatric Specialty Exam: Physical Exam  Constitutional: He appears well-developed.  Respiratory: Effort normal.  Musculoskeletal: Normal range of motion.  Neurological: He is alert.  Psychiatric: His mood appears anxious. His affect is labile. His speech is rapid and/or pressured. He is agitated. Thought content is delusional. Cognition and memory are normal. He expresses impulsivity.    Review of Systems  All other systems reviewed and are negative.   Blood pressure 125/80, pulse (!) 113, temperature 98.5 F (36.9 C), temperature source Oral, resp. rate 16, SpO2 99 %.There is no height or weight on file to calculate BMI.  General Appearance: Disheveled  Eye Contact:  Fair  Speech:  Pressured  Volume:  Increased  Mood:  Angry, Anxious and Irritable  Affect:  Congruent and Labile  Thought Process:  Disorganized  Orientation:  Full (Time, Place, and Person)  Thought Content:  Rumination  Suicidal Thoughts:  No  Homicidal Thoughts:  Yes.  without intent/plan  Memory:  Immediate;   Fair Recent;   Fair Remote;   Fair  Judgement:  Poor  Insight:  Lacking  Psychomotor Activity:  Increased  Concentration:  Concentration: Fair and Attention Span: Fair  Recall:  [employer] of Knowledge:  Fair  Language:  Good  Akathisia:  No  Handed:  Right  AIMS (if indicated):     Assets:  Financial Resources/Insurance Housing Resilience Social Support  ADL's:  Intact  Cognition:  WNL  Sleep:        Treatment Plan Summary: Plan Bipolar 1 disorder  [facility] up at [facility] for therapy and medication management Utilize resources for shelters if needed.  Take all medications as directed Avoid the use of  alcohol and illicit drugs  Disposition: No evidence of imminent risk to self or others at present.   Patient does not meet criteria for psychiatric inpatient admission. Discussed crisis plan, support from social network, calling 911, coming to the Emergency Department, and calling Suicide Hotline.  Ethelene Hal, NP 09/06/2016 1:25 PM  Patient seen face-to-face for psychiatric evaluation, chart reviewed and case discussed with the physician extender and developed treatment plan. Reviewed the information documented and agree with the treatment plan. Corena Pilgrim, MD

## 2016-09-06 NOTE — ED Provider Notes (Signed)
Medical clearance, diabetes.  Check UA to ensure no DKA.  TTS will be consulted and determine disposition.  Was transferred here from psychiatric facility.  UA showing 20 ketone.  This is related to his diabetes.  Pt to resume his diabetic medications.  Psych have evaluated pt and will further manage his psychiatric ailment.  Pt is medically cleared.   BP 125/80 (BP Location: Left Arm) Comment: Simultaneous filing. User may not have seen previous data.  Pulse (!) 113 Comment: Simultaneous filing. User may not have seen previous data.  Temp 98.5 F (36.9 C) (Oral)   Resp 16   SpO2 99% Comment: Simultaneous filing. User may not have seen previous data.  Results for orders placed or performed during the hospital encounter of 09/05/16  Comprehensive metabolic panel  Result Value Ref Range   Sodium 135 135 - 145 mmol/L   Potassium 3.4 (L) 3.5 - 5.1 mmol/L   Chloride 103 101 - 111 mmol/L   CO2 20 (L) 22 - 32 mmol/L   Glucose, Bld 277 (H) 65 - 99 mg/dL   BUN 6 6 - 20 mg/dL   Creatinine, Ser 0.76 0.61 - 1.24 mg/dL   Calcium 8.9 8.9 - 10.3 mg/dL   Total Protein 6.5 6.5 - 8.1 g/dL   Albumin 3.7 3.5 - 5.0 g/dL   AST 21 15 - 41 U/L   ALT 33 17 - 63 U/L   Alkaline Phosphatase 141 (H) 38 - 126 U/L   Total Bilirubin 0.4 0.3 - 1.2 mg/dL   GFR calc non Af Amer >60 >60 mL/min   GFR calc Af Amer >60 >60 mL/min   Anion gap 12 5 - 15  Ethanol  Result Value Ref Range   Alcohol, Ethyl (B) 44 (H) <5 mg/dL  cbc  Result Value Ref Range   WBC 9.8 4.0 - 10.5 K/uL   RBC 4.79 4.22 - 5.81 MIL/uL   Hemoglobin 15.6 13.0 - 17.0 g/dL   HCT 43.1 39.0 - 52.0 %   MCV 90.0 78.0 - 100.0 fL   MCH 32.6 26.0 - 34.0 pg   MCHC 36.2 (H) 30.0 - 36.0 g/dL   RDW 12.7 11.5 - 15.5 %   Platelets 182 150 - 400 K/uL  Rapid urine drug screen (hospital performed)  Result Value Ref Range   Opiates NONE DETECTED NONE DETECTED   Cocaine NONE DETECTED NONE DETECTED   Benzodiazepines NONE DETECTED NONE DETECTED   Amphetamines  NONE DETECTED NONE DETECTED   Tetrahydrocannabinol NONE DETECTED NONE DETECTED   Barbiturates NONE DETECTED NONE DETECTED  Urinalysis, Routine w reflex microscopic  Result Value Ref Range   Color, Urine YELLOW YELLOW   APPearance HAZY (A) CLEAR   Specific Gravity, Urine 1.024 1.005 - 1.030   pH 5.0 5.0 - 8.0   Glucose, UA >=500 (A) NEGATIVE mg/dL   Hgb urine dipstick NEGATIVE NEGATIVE   Bilirubin Urine NEGATIVE NEGATIVE   Ketones, ur 20 (A) NEGATIVE mg/dL   Protein, ur NEGATIVE NEGATIVE mg/dL   Nitrite NEGATIVE NEGATIVE   Leukocytes, UA MODERATE (A) NEGATIVE   RBC / HPF 6-30 0 - 5 RBC/hpf   WBC, UA 6-30 0 - 5 WBC/hpf   Bacteria, UA RARE (A) NONE SEEN   Squamous Epithelial / LPF 0-5 (A) NONE SEEN   Trichomonas, UA PRESENT    Sperm, UA PRESENT   CBG monitoring, ED  Result Value Ref Range   Glucose-Capillary 348 (H) 65 - 99 mg/dL   No results found.  Domenic Moras, PA-C 09/06/16 [place], April, MD 09/07/16 [phone]

## 2016-09-06 NOTE — [facility] Suicide Risk Assessment (Signed)
Suicide Risk Assessment  Discharge Assessment   [facility] Discharge Suicide Risk Assessment   Principal Problem: Bipolar disorder [facility]) Discharge Diagnoses:  Patient Active Problem List   Diagnosis Date Noted  . Recurrent falls [R29.6] 07/07/2016  . Falls [phone] 06/18/2016  . Wernicke's encephalopathy [E51.2]   . Altered mental status [R41.82] 06/09/2016  . Bipolar disorder ([place]) [F31.9] 06/01/2016  . Diabetes mellitus due to underlying condition with ketoacidosis without coma, with long-term current use of insulin ([place]) [E08.10, Z79.4] 02/04/2016  . Dry skin [L85.3] 01/18/2016  . Peripheral vascular disease ([place]) [I73.9] 11/15/2015  . Tobacco abuse [Z72.0] 09/02/2015  . Tricompartment osteoarthritis of both knees [M17.0] 06/15/2015  . Chronic abdominal pain [R10.9, G89.29] 03/14/2015  . Vitamin D deficiency [E55.9] 03/12/2015  . Hyperlipidemia [E78.5] 02/17/2015  . Fall [W19.XXXA] 12/24/2014  . Protein-calorie malnutrition, severe [E43] 11/18/2014  . Alcohol use disorder ([place]) [F10.99]   . Cervical arthritis ([place]) [M46.92] 01/01/2014  . Health care maintenance [Z00.00] 08/25/2011  . Chronic alcoholic pancreatitis ([place]) [K86.0] 04/20/2011  . Type 2 diabetes mellitus with peripheral neuropathy ([place]) [E11.42] 12/09/2010  . HTN (hypertension) [I10] 12/09/2010    Total Time spent with patient: 45 minutes  Musculoskeletal: Strength & Muscle Tone: within normal limits Gait & Station: normal Patient leans: N/A  Psychiatric Specialty Exam: Physical Exam  Constitutional: He appears well-developed.  Respiratory: Effort normal.  Musculoskeletal: Normal range of motion.  Neurological: He is alert.  Psychiatric: His mood appears anxious. His affect is labile. His speech is rapid and/or pressured. He is agitated. Thought content is delusional. Cognition and memory are normal. He expresses impulsivity.   Review of Systems  All other systems reviewed and are negative.  Blood pressure  125/80, pulse (!) 113, temperature 98.5 F (36.9 C), temperature source Oral, resp. rate 16, SpO2 99 %.There is no height or weight on file to calculate BMI. General Appearance: Disheveled Eye Contact:  Fair Speech:  Pressured Volume:  Increased Mood:  Angry, Anxious and Irritable Affect:  Congruent and Labile Thought Process:  Disorganized Orientation:  Full (Time, Place, and Person) Thought Content:  Rumination Suicidal Thoughts:  No Homicidal Thoughts:  Yes.  without intent/plan Memory:  Immediate;   Fair Recent;   Fair Remote;   Fair Judgement:  Poor Insight:  Lacking Psychomotor Activity:  Increased Concentration:  Concentration: Fair and Attention Span: Fair Recall:  [employer] of Knowledge:  Fair Language:  Good Akathisia:  No Handed:  Right AIMS (if indicated):    Assets:  Financial Resources/Insurance Housing Resilience Social Support ADL's:  Intact Cognition:  WNL  Mental Status Per Nursing Assessment::   On Admission:   agitated and angry  Demographic Factors:  Male and Low socioeconomic status  Loss Factors: Financial problems/change in socioeconomic status  Historical Factors: Impulsivity  Risk Reduction Factors:   Sense of responsibility to family and Living with another person, especially a relative  Continued Clinical Symptoms:  Bipolar Disorder:   Mixed State Alcohol/Substance Abuse/Dependencies More than one psychiatric diagnosis Previous Psychiatric Diagnoses and Treatments  Cognitive Features That Contribute To Risk:  Polarized thinking    Suicide Risk:  Minimal: No identifiable suicidal ideation.  Patients presenting with no risk factors but with morbid ruminations; may be classified as minimal risk based on the severity of the depressive symptoms    Plan Of Care/Follow-up recommendations:  Activity:  as tolerated Diet:  Heart Healthy  Ethelene Hal, NP 09/06/2016, 1:36 PM

## 2016-09-06 NOTE — ED Notes (Signed)
Pt sleeping, in no acute distress.

## 2016-09-06 NOTE — ED Provider Notes (Signed)
[facility] DEPT Provider Note   CSN: [id number] Arrival date & time: 09/05/16  2221     History   Chief Complaint Chief Complaint  Patient presents with  . Medical Clearance    HPI Michael [name] is a 61 y.o. male with a hx of anxiety, arthritis, bipolar disorder, diabetic neuropathy, GERD, schizophrenia, IDDM presents to the Emergency Department from [facility] for medical clearance complaining of gradual, persistent, progressively worsening feelings of depression and "unstable" onset "awhile."  Pt also c/o chronic pain in his BLE.   He reports he is angry with his sister but doesn't want to hurt her.  He reports this is the reason he left her home last night.  He reports EtOH and marijuana usage.  He reports no medications for his schizophrenia for > 1 year.  Associated symptoms include difficulty sleeping. He reports he is taking his insulin as directed.  He reports blood sugar is usually 200-300.  Pt reports polyuria and polydipsia at baseline.  The history is provided by the patient and medical records. No language interpreter was used.    Past Medical History:  Diagnosis Date  . Anxiety   . Arthritis    "joints; shoulders; feet" (06/08/2016)  . Bipolar disorder ([place])   . Daily headache    "7:30 - 8:00 q night" (06/08/2016)  . Diabetic peripheral neuropathy ([place])   . GERD (gastroesophageal reflux disease)   . Pancreatitis   . Schizophrenia ([place])   . Seizures ([place]) 01/2016 X 2  . Type II diabetes mellitus [facility])     Patient Active Problem List   Diagnosis Date Noted  . Recurrent falls 07/07/2016  . Falls 06/18/2016  . Wernicke's encephalopathy   . Altered mental status 06/09/2016  . Bipolar disorder ([place]) 06/01/2016  . Diabetes mellitus due to underlying condition with ketoacidosis without coma, with long-term current use of insulin ([place]) 02/04/2016  . Dry skin 01/18/2016  . Peripheral vascular disease ([place]) 11/15/2015  . Tobacco abuse 09/02/2015  . Tricompartment  osteoarthritis of both knees 06/15/2015  . Chronic abdominal pain 03/14/2015  . Vitamin D deficiency 03/12/2015  . Hyperlipidemia 02/17/2015  . Fall 12/24/2014  . Protein-calorie malnutrition, severe 11/18/2014  . Alcohol use disorder ([place])   . Cervical arthritis ([place]) 01/01/2014  . Health care maintenance 08/25/2011  . Chronic alcoholic pancreatitis ([place]) 04/20/2011  . Type 2 diabetes mellitus with peripheral neuropathy ([place]) 12/09/2010  . HTN (hypertension) 12/09/2010    Past Surgical History:  Procedure Laterality Date  . BALLOON DILATION  03/22/2011   Procedure: BALLOON DILATION;  Surgeon: Gatha Mayer, MD;  [facility];  Service: Endoscopy;  Laterality: N/A;  . COLONOSCOPY N/A 05/22/2012   Procedure: COLONOSCOPY;  Surgeon: Gatha Mayer, MD;  Location: [facility];  Service: Endoscopy;  Laterality: N/A;  . COLONOSCOPY W/ BIOPSIES AND POLYPECTOMY  09/12/11  . ESOPHAGOGASTRODUODENOSCOPY  03/22/2011   Procedure: ESOPHAGOGASTRODUODENOSCOPY (EGD);  Surgeon: Gatha Mayer, MD;  [facility];  Service: Endoscopy;  Laterality: N/A;  egd with balloon   . EUS  04/20/2011   Procedure: UPPER ENDOSCOPIC ULTRASOUND (EUS) LINEAR;  Surgeon: Milus Banister, MD;  [facility];  Service: Endoscopy;  Laterality: N/A;  . KNEE ARTHROSCOPY Left        Home Medications    Prior to Admission medications   Medication Sig Start Date End Date Taking? Authorizing Provider  diclofenac sodium (VOLTAREN) 1 % GEL Apply 2 g topically 4 (four) times daily -  before meals  and at bedtime. 06/22/16  Yes Molt, Bethany, DO  DULoxetine (CYMBALTA) 60 MG capsule TAKE 1 CAPSULE EVERY DAY 08/10/16  Yes Rice, Resa Miner, MD  folic acid (FOLVITE) 1 MG tablet Take 1 tablet (1 mg total) by mouth daily. 06/23/16  Yes Molt, Bethany, DO  gabapentin (NEURONTIN) 300 MG capsule TAKE 3 CAPSULES BY MOUTH  EVERY MORNING AND  AFTERNOON. TAKE 4 CAPSULES  BY MOUTH AT NIGHT BEFORE  BED 03/10/16  Yes  Rice, Resa Miner, MD  hydrOXYzine (ATARAX/VISTARIL) 10 MG tablet Take 1 tablet (10 mg total) by mouth 3 (three) times daily as needed for itching. 08/18/16  Yes Rice, Resa Miner, MD  insulin aspart (NOVOLOG) 100 UNIT/ML injection Inject 5 units under the skin before breakfast and before dinner. Inject 10 units under the skin before lunch time Patient taking differently: Inject 5 units under the skin before breakfast and 15 units before dinner. Inject 10 units under the skin before lunch time 07/21/16  Yes Rice, Resa Miner, MD  lipase/protease/amylase (CREON) 36000 UNITS CPEP capsule TAKE 1 CAPSULE BY MOUTH 3  TIMES DAILY BEFORE MEALS 03/02/16  Yes Rice, Resa Miner, MD  Multiple Vitamin (MULTIVITAMIN WITH MINERALS) TABS tablet Take 1 tablet by mouth daily. 06/23/16  Yes Molt, Bethany, DO  omega-3 acid ethyl esters (LOVAZA) 1 g capsule Take 1 g by mouth daily.  07/24/16  Yes [provider]  pantoprazole (PROTONIX) 40 MG tablet Take 1 tablet (40 mg total) by mouth daily. 08/18/16  Yes Rice, Resa Miner, MD  pravastatin (PRAVACHOL) 10 MG tablet Take 1 tablet (10 mg total) by mouth daily at 6 PM. 08/18/16  Yes Rice, Resa Miner, MD  ACCU-CHEK SOFTCLIX LANCETS lancets Use 3 times daily to check blood sugar. diag code E11.42. Insulin dependent 08/18/16   Rice, Resa Miner, MD  Alcohol Swabs (B-D SINGLE USE SWABS REGULAR) PADS Patient uses 6 times a day. Patient tests 3 times per day and administers insulin 3 times a day. ICD 10 code E11.42 08/24/16   Collier Salina, MD  ARIPiprazole (ABILIFY) 5 MG tablet Take 1 tablet (5 mg total) by mouth daily. 08/16/16   Rice, Resa Miner, MD  Blood Glucose Calibration (ACCU-CHEK AVIVA) SOLN 1 drop by In Vitro route as needed. diag code E08.10 insulin dependent. Use as device directs 08/25/16   Collier Salina, MD  Blood Glucose Monitoring Suppl (ACCU-CHEK AVIVA PLUS) w/Device KIT 1 kit by Does not apply route every morning. 08/18/16   Rice,  Resa Miner, MD  COMFORT EZ PEN NEEDLES 31G X 5 MM [place]  07/24/16   [provider]  glucose blood (ACCU-CHEK AVIVA PLUS) test strip Use 3 times daily to check blood sugar. diag code E11.42. Insulin dependent 06/15/16   Rice, Resa Miner, MD  ibuprofen (ADVIL,MOTRIN) 800 MG tablet Take 1 tablet (800 mg total) by mouth 3 (three) times daily as needed. 08/24/16   Collier Salina, MD  thiamine (VITAMIN B-1) 100 MG tablet Take 1 tablet (100 mg total) by mouth daily. Patient not taking: Reported on 09/05/2016 06/15/16   Molt, Bethany, DO  UNABLE TO FIND Med Name: Med pass 120 mL by mouth 3 times daily    [provider]    Family History Family History  Problem Relation Age of Onset  . Hypertension Sister   . Diabetes Sister   . Heart disease Sister   . Colon cancer Neg Hx   . Stomach cancer Neg Hx   . Anesthesia problems Neg  Hx   . Hypotension Neg Hx   . Pseudochol deficiency Neg Hx   . Malignant hyperthermia Neg Hx     Social History Social History  Substance Use Topics  . Smoking status: Current Every Day Smoker    Packs/day: 0.25    Years: 42.00    Types: Cigarettes  . Smokeless tobacco: Never Used     Comment: 5 cigs per day  . Alcohol use 36.0 oz/week    50 Shots of liquor, 10 Standard drinks or equivalent per week     Comment: Occasionally.     Allergies   Ace inhibitors; Losartan; and Tylenol [acetaminophen]   Review of Systems Review of Systems  Constitutional: Negative for appetite change, diaphoresis, fatigue, fever and unexpected weight change.  HENT: Negative for mouth sores.   Eyes: Negative for visual disturbance.  Respiratory: Negative for cough, chest tightness, shortness of breath and wheezing.   Cardiovascular: Negative for chest pain.  Gastrointestinal: Negative for abdominal pain, constipation, diarrhea, nausea and vomiting.  Endocrine: Positive for polydipsia and polyuria. Negative for polyphagia.  Genitourinary: Negative for  dysuria, frequency, hematuria and urgency.  Musculoskeletal: Positive for arthralgias and myalgias. Negative for back pain and neck stiffness.  Skin: Negative for rash.  Allergic/Immunologic: Negative for immunocompromised state.  Neurological: Negative for syncope, light-headedness and headaches.  Hematological: Does not bruise/bleed easily.  Psychiatric/Behavioral: Positive for agitation and sleep disturbance. Negative for hallucinations, self-injury and suicidal ideas. The patient is nervous/anxious.   All other systems reviewed and are negative.    Physical Exam Updated Vital Signs BP 130/80 (BP Location: Left Arm)   Pulse 94   Temp 98.5 F (36.9 C) (Oral)   Resp 18   SpO2 99%   Physical Exam  Constitutional: He appears well-developed and well-nourished. No distress.  Awake, alert, nontoxic appearance  HENT:  Head: Normocephalic and atraumatic.  Mouth/Throat: Oropharynx is clear and moist. No oropharyngeal exudate.  Eyes: Conjunctivae are normal. No scleral icterus.  Neck: Normal range of motion. Neck supple.  Cardiovascular: Normal rate, regular rhythm and intact distal pulses.   Pulmonary/Chest: Effort normal and breath sounds normal. No respiratory distress. He has no wheezes.  Equal chest expansion  Abdominal: Soft. Bowel sounds are normal. He exhibits no mass. There is no tenderness. There is no rebound and no guarding.  Musculoskeletal: Normal range of motion. He exhibits no edema.  Feet:  Right Foot:  Skin Integrity: Positive for dry skin. Negative for ulcer, blister, skin breakdown, erythema, warmth or callus.  Left Foot:  Skin Integrity: Positive for dry skin. Negative for ulcer, blister, skin breakdown, erythema, warmth or callus.  Neurological: He is alert.  Speech is clear and goal oriented Moves extremities without ataxia  Skin: Skin is warm and dry. He is not diaphoretic.  Psychiatric: His mood appears anxious. His affect is angry. His speech is rapid  and/or pressured. He is agitated. Thought content is not paranoid. He expresses impulsivity. He expresses no homicidal and no suicidal ideation. He expresses no suicidal plans and no homicidal plans.  Nursing note and vitals reviewed.    ED Treatments / Results  Labs (all labs ordered are listed, but only abnormal results are displayed) Labs Reviewed  COMPREHENSIVE METABOLIC PANEL - Abnormal; Notable for the following:       Result Value   Potassium 3.4 (*)    CO2 20 (*)    Glucose, Bld 277 (*)    Alkaline Phosphatase 141 (*)    All other components within  normal limits  ETHANOL - Abnormal; Notable for the following:    Alcohol, Ethyl (B) 44 (*)    All other components within normal limits  CBC - Abnormal; Notable for the following:    MCHC 36.2 (*)    All other components within normal limits  RAPID URINE DRUG SCREEN, HOSP PERFORMED  URINALYSIS, ROUTINE W REFLEX MICROSCOPIC    Procedures Procedures (including critical care time)  Medications Ordered in ED Medications  LORazepam (ATIVAN) injection 0-4 mg (not administered)    Or  LORazepam (ATIVAN) tablet 0-4 mg (not administered)  LORazepam (ATIVAN) injection 0-4 mg (not administered)    Or  LORazepam (ATIVAN) tablet 0-4 mg (not administered)  thiamine (VITAMIN B-1) tablet 100 mg (not administered)    Or  thiamine (B-1) injection 100 mg (not administered)  nicotine (NICODERM CQ - dosed in mg/24 hours) patch 21 mg (not administered)  alum & mag hydroxide-simeth (MAALOX/MYLANTA) 200-200-20 MG/5ML suspension 30 mL (not administered)  diclofenac sodium (VOLTAREN) 1 % transdermal gel 2 g (not administered)  DULoxetine (CYMBALTA) DR capsule 60 mg (not administered)  folic acid (FOLVITE) tablet 1 mg (not administered)  gabapentin (NEURONTIN) capsule 900 mg (900 mg Oral Not Given 09/06/16 0436)  lipase/protease/amylase (CREON) capsule 36,000 Units (not administered)  pantoprazole (PROTONIX) EC tablet 40 mg (not administered)    pravastatin (PRAVACHOL) tablet 10 mg (not administered)  insulin aspart (novoLOG) injection 5 Units (not administered)  insulin aspart (novoLOG) injection 10 Units (not administered)  insulin aspart (novoLOG) injection 15 Units (not administered)  potassium chloride SA (K-DUR,KLOR-CON) CR tablet 40 mEq (40 mEq Oral Given 09/06/16 0259)     Initial Impression / Assessment and Plan / ED Course  I have reviewed the triage vital signs and the nursing notes.  Pertinent labs & imaging results that were available during my care of the patient were reviewed by me and considered in my medical decision making (see chart for details).     Patient [facility] from [facility] for evaluation.  He has rapid and pressured speech, but denies SI, HI or auditory/visual hallucinations.  Labs are reassuring. Patient's blood sugar 277.  Patient reports this is baseline for him. CO2 is 20.  No anion gap.  Alcohol level is 44.  Urinalysis and urine drug screen is pending. If these are without acute abnormalities, patient can be medically cleared. He will need TTS evaluation.  At shift change care was transferred to Domenic Moras, PA-C who will follow [facility] for final medical clearance.    Final Clinical Impressions(s) / ED Diagnoses   Final diagnoses:  Bipolar affective disorder, remission status unspecified ([place])  Diabetic polyneuropathy associated with type 1 diabetes mellitus [facility])    New Prescriptions New Prescriptions   No medications on file     Agapito Games 09/06/16 [place], April, MD 09/06/16 [phone]

## 2016-09-06 NOTE — ED Notes (Signed)
Pt not in ED room x 10 minutes.

## 2016-09-12 ENCOUNTER — Encounter (INDEPENDENT_AMBULATORY_CARE_PROVIDER_SITE_OTHER): Payer: Self-pay | Admitting: Orthopaedic Surgery

## 2016-09-12 ENCOUNTER — Ambulatory Visit (INDEPENDENT_AMBULATORY_CARE_PROVIDER_SITE_OTHER): Payer: Medicare HMO

## 2016-09-12 ENCOUNTER — Ambulatory Visit (INDEPENDENT_AMBULATORY_CARE_PROVIDER_SITE_OTHER): Payer: Medicare HMO | Admitting: Orthopaedic Surgery

## 2016-09-12 DIAGNOSIS — M25562 Pain in left knee: Secondary | ICD-10-CM

## 2016-09-12 DIAGNOSIS — M25561 Pain in right knee: Secondary | ICD-10-CM | POA: Diagnosis not present

## 2016-09-12 DIAGNOSIS — G8929 Other chronic pain: Secondary | ICD-10-CM

## 2016-09-12 DIAGNOSIS — R6889 Other general symptoms and signs: Secondary | ICD-10-CM | POA: Diagnosis not present

## 2016-09-12 NOTE — Progress Notes (Signed)
Office Visit Note   Patient: Michael [name]           Date of Birth: Jul 07, 1955           MRN: [id number] Visit Date: 09/12/2016              Requested by: Collier Salina, MD [place], [place] [id number] PCP: Collier Salina, MD   Assessment & Plan: Visit Diagnoses:  1. Chronic pain of both knees     Plan: Overall impression is moderate degenerative joint disease of both knees. Patient has failed conservative treatment. Recommend MRI to fully evaluate the severity of arthritis which I think may be worse than what the x-rays show. Follow-up after the MRI.  Follow-Up Instructions: Return in about 10 days (around 09/22/2016).   Orders:  Orders Placed This Encounter  Procedures  . XR KNEE 3 VIEW LEFT  . XR KNEE 3 VIEW RIGHT  . MR Knee Left w/o contrast  . MR Knee Right w/o contrast   No orders of the defined types were placed in this encounter.     Procedures: No procedures performed   Clinical Data: No additional findings.   Subjective: Chief Complaint  Patient presents with  . Right Knee - Pain  . Left Knee - Pain    Patient is a 61 year old gentleman who comes in with bilateral knee pain worse on the left. This is been a chronic issue. He is done physical therapy and has had cortisone injections in the past with minimal relief. He continues to have significant difficulty with ADLs and pain is worse with weightbearing. Denies any numbness and tingling and radiation of pain.    Review of Systems  Constitutional: Negative.   All other systems reviewed and are negative.    Objective: Vital Signs: There were no vitals taken for this visit.  Physical Exam  Constitutional: He is oriented to person, place, and time. He appears well-developed and well-nourished.  HENT:  Head: Normocephalic and atraumatic.  Eyes: Pupils are equal, round, and reactive to light.  Neck: Neck supple.  Pulmonary/Chest: Effort normal.  Abdominal: Soft.    Musculoskeletal: Normal range of motion.  Neurological: He is alert and oriented to person, place, and time.  Skin: Skin is warm.  Psychiatric: He has a normal mood and affect. His behavior is normal. Judgment and thought content normal.  Nursing note and vitals reviewed.   Ortho Exam Bilateral knee exam shows no joint effusion. Collaterals and cruciate's are stable. Normal range of motion. He has significant patellar crepitus with flexion of the knee. Specialty Comments:  No specialty comments available.  Imaging: Xr Knee 3 View Left  Result Date: 09/12/2016 Mild to moderate degenerative joint disease  Xr Knee 3 View Right  Result Date: 09/12/2016 Mild to moderate degenerative joint disease    PMFS History: Patient Active Problem List   Diagnosis Date Noted  . Recurrent falls 07/07/2016  . Falls 06/18/2016  . Wernicke's encephalopathy   . Altered mental status 06/09/2016  . Bipolar disorder ([place]) 06/01/2016  . Diabetes mellitus due to underlying condition with ketoacidosis without coma, with long-term current use of insulin ([place]) 02/04/2016  . Dry skin 01/18/2016  . Peripheral vascular disease ([place]) 11/15/2015  . Tobacco abuse 09/02/2015  . Tricompartment osteoarthritis of both knees 06/15/2015  . Chronic abdominal pain 03/14/2015  . Vitamin D deficiency 03/12/2015  . Hyperlipidemia 02/17/2015  . Fall 12/24/2014  . Protein-calorie malnutrition, severe 11/18/2014  . Alcohol  use disorder ([place])   . Cervical arthritis ([place]) 01/01/2014  . Health care maintenance 08/25/2011  . Chronic alcoholic pancreatitis ([place]) 04/20/2011  . Type 2 diabetes mellitus with peripheral neuropathy ([place]) 12/09/2010  . HTN (hypertension) 12/09/2010   Past Medical History:  Diagnosis Date  . Anxiety   . Arthritis    "joints; shoulders; feet" (06/08/2016)  . Bipolar disorder ([place])   . Daily headache    "7:30 - 8:00 q night" (06/08/2016)  . Diabetic peripheral neuropathy ([place])   . GERD  (gastroesophageal reflux disease)   . Pancreatitis   . Schizophrenia ([place])   . Seizures ([place]) 01/2016 X 2  . Type II diabetes mellitus ([facility])     Family History  Problem Relation Age of Onset  . Hypertension Sister   . Diabetes Sister   . Heart disease Sister   . Colon cancer Neg Hx   . Stomach cancer Neg Hx   . Anesthesia problems Neg Hx   . Hypotension Neg Hx   . Pseudochol deficiency Neg Hx   . Malignant hyperthermia Neg Hx     Past Surgical History:  Procedure Laterality Date  . BALLOON DILATION  03/22/2011   Procedure: BALLOON DILATION;  Surgeon: Gatha Mayer, MD;  [facility];  Service: Endoscopy;  Laterality: N/A;  . COLONOSCOPY N/A 05/22/2012   Procedure: COLONOSCOPY;  Surgeon: Gatha Mayer, MD;  Location: [facility];  Service: Endoscopy;  Laterality: N/A;  . COLONOSCOPY W/ BIOPSIES AND POLYPECTOMY  09/12/11  . ESOPHAGOGASTRODUODENOSCOPY  03/22/2011   Procedure: ESOPHAGOGASTRODUODENOSCOPY (EGD);  Surgeon: Gatha Mayer, MD;  [facility];  Service: Endoscopy;  Laterality: N/A;  egd with balloon   . EUS  04/20/2011   Procedure: UPPER ENDOSCOPIC ULTRASOUND (EUS) LINEAR;  Surgeon: Milus Banister, MD;  [facility];  Service: Endoscopy;  Laterality: N/A;  . KNEE ARTHROSCOPY Left    Social History   Occupational History  .    [employer]    worked for 6 years  . 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 Smoking status: Current Every Day Smoker    Packs/day: 0.25    Years: 42.00    Types: Cigarettes  . Smokeless tobacco: Never Used     Comment: 5 cigs per day  . Alcohol use 36.0 oz/week    50 Shots of liquor, 10 Standard drinks or equivalent per week     Comment: Occasionally.  . Drug use: Yes    Types: Marijuana     Comment: 06/08/2016  "maybe once/week"  . Sexual activity: Yes

## 2016-09-14 ENCOUNTER — Encounter: Payer: Self-pay | Admitting: Podiatry

## 2016-09-14 ENCOUNTER — Ambulatory Visit (INDEPENDENT_AMBULATORY_CARE_PROVIDER_SITE_OTHER): Payer: Medicare HMO | Admitting: Podiatry

## 2016-09-14 ENCOUNTER — Telehealth: Payer: Self-pay

## 2016-09-14 DIAGNOSIS — B351 Tinea unguium: Secondary | ICD-10-CM

## 2016-09-14 DIAGNOSIS — M79675 Pain in left toe(s): Secondary | ICD-10-CM

## 2016-09-14 DIAGNOSIS — M79674 Pain in right toe(s): Secondary | ICD-10-CM

## 2016-09-14 DIAGNOSIS — R6889 Other general symptoms and signs: Secondary | ICD-10-CM | POA: Diagnosis not present

## 2016-09-14 NOTE — Progress Notes (Signed)
Patient ID: Michael [name], male   DOB: 04-13-1955, 61 y.o.   MRN: [id number] Complaint:  Visit Type: Patient returns to my office for continued preventative foot care services. Complaint: Patient states" my nails have grown long and thick and become painful to walk and wear shoes" Patient has been diagnosed with DM with neuropathy. The patient presents for preventative foot care services. No changes to ROS.    Podiatric Exam: Vascular: dorsalis pedis and posterior tibial pulses are palpable bilateral. Capillary return is immediate. Temperature gradient is WNL. Skin turgor WNL  Sensorium: Decreased  Semmes Weinstein monofilament test. Normal tactile sensation bilaterally. Nail Exam: Pt has thick disfigured discolored nails with subungual debris noted bilateral entire nail hallux through fifth toenails Ulcer Exam: There is no evidence of ulcer or pre-ulcerative changes or infection. Orthopedic Exam: Muscle tone and strength are WNL. No limitations in general ROM. No crepitus or effusions noted. Foot type and digits show no abnormalities. Bony prominences are unremarkable. Skin: No Porokeratosis. No infection or ulcers.  Plantar skin xerosis.  Diagnosis:  Onychomycosis, , Pain in right toe, pain in left toes  Treatment & Plan Procedures and Treatment: Consent by patient was obtained for treatment procedures. The patient understood the discussion of treatment and procedures well. All questions were answered thoroughly reviewed. Debridement of mycotic and hypertrophic toenails, 1 through 5 bilateral and clearing of subungual debris. No ulceration, no infection noted. . Return Visit-Office Procedure: Patient instructed to return to the office for a follow up visit 3 months for continued evaluation and treatment.    Gardiner Barefoot DPM

## 2016-09-14 NOTE — Telephone Encounter (Signed)
Would like to speak with a nurse about Ensure. Please call pt back.

## 2016-09-14 NOTE — Telephone Encounter (Signed)
rtc to pt, someone answered and hung up, will try again

## 2016-09-20 ENCOUNTER — Other Ambulatory Visit: Payer: Self-pay | Admitting: *Deleted

## 2016-09-20 MED ORDER — ARIPIPRAZOLE 5 MG PO TABS: 5.0000 mg | ORAL_TABLET | Freq: Every day | ORAL | 2 refills | Status: DC

## 2016-09-21 ENCOUNTER — Ambulatory Visit (INDEPENDENT_AMBULATORY_CARE_PROVIDER_SITE_OTHER): Payer: Medicare HMO | Admitting: Orthopaedic Surgery

## 2016-09-24 ENCOUNTER — Encounter (HOSPITAL_COMMUNITY): Payer: Self-pay | Admitting: Emergency Medicine

## 2016-09-24 ENCOUNTER — Emergency Department (HOSPITAL_COMMUNITY)
Admission: EM | Admit: 2016-09-24 | Discharge: 2016-09-25 | Disposition: A | Discharge status: Home / Self Care | Payer: Medicare Other | Attending: Emergency Medicine | Admitting: Emergency Medicine

## 2016-09-24 DIAGNOSIS — Z79899 Other long term (current) drug therapy: Secondary | ICD-10-CM | POA: Insufficient documentation

## 2016-09-24 DIAGNOSIS — E1165 Type 2 diabetes mellitus with hyperglycemia: Secondary | ICD-10-CM | POA: Insufficient documentation

## 2016-09-24 DIAGNOSIS — Z794 Long term (current) use of insulin: Secondary | ICD-10-CM | POA: Diagnosis not present

## 2016-09-24 DIAGNOSIS — L853 Xerosis cutis: Secondary | ICD-10-CM | POA: Diagnosis not present

## 2016-09-24 DIAGNOSIS — R739 Hyperglycemia, unspecified: Secondary | ICD-10-CM

## 2016-09-24 DIAGNOSIS — F17210 Nicotine dependence, cigarettes, uncomplicated: Secondary | ICD-10-CM | POA: Diagnosis not present

## 2016-09-24 DIAGNOSIS — I10 Essential (primary) hypertension: Secondary | ICD-10-CM | POA: Diagnosis not present

## 2016-09-24 LAB — URINALYSIS, ROUTINE W REFLEX MICROSCOPIC
Bilirubin Urine: NEGATIVE
HGB URINE DIPSTICK: NEGATIVE
Ketones, ur: 5 mg/dL — AB
LEUKOCYTES UA: NEGATIVE
NITRITE: NEGATIVE
PROTEIN: NEGATIVE mg/dL
SPECIFIC GRAVITY, URINE: 1.022 (ref 1.005–1.030)
pH: 5 (ref 5.0–8.0)

## 2016-09-24 LAB — BASIC METABOLIC PANEL
Anion gap: 15 (ref 5–15)
CALCIUM: 8.4 mg/dL — AB (ref 8.9–10.3)
CO2: 20 mmol/L — ABNORMAL LOW (ref 22–32)
CREATININE: 0.67 mg/dL (ref 0.61–1.24)
Chloride: 100 mmol/L — ABNORMAL LOW (ref 101–111)
GFR calc Af Amer: >60 mL/min (ref >60)
GLUCOSE: 266 mg/dL — AB (ref 65–99)
Potassium: 3.6 mmol/L (ref 3.5–5.1)
SODIUM: 135 mmol/L (ref 135–145)

## 2016-09-24 LAB — CBC
HCT: 42.6 % (ref 39.0–52.0)
Hemoglobin: 14.9 g/dL (ref 13.0–17.0)
MCH: 31.6 pg (ref 26.0–34.0)
MCHC: 35 g/dL (ref 30.0–36.0)
MCV: 90.3 fL (ref 78.0–100.0)
PLATELETS: 172 10*3/uL (ref 150–400)
RBC: 4.72 MIL/uL (ref 4.22–5.81)
RDW: 13.3 % (ref 11.5–15.5)
WBC: 7.8 10*3/uL (ref 4.0–10.5)

## 2016-09-24 LAB — CBG MONITORING, ED: GLUCOSE-CAPILLARY: 264 mg/dL — AB (ref 65–99)

## 2016-09-24 NOTE — ED Triage Notes (Signed)
Brought by ems.  Call received for wandering around parking lot.  Hx of bipolar and schizo.  Per ems [id number].  Per patient c/o generalized pain all over.  Reports drinking 2 beers this evening.

## 2016-09-25 DIAGNOSIS — E1165 Type 2 diabetes mellitus with hyperglycemia: Secondary | ICD-10-CM | POA: Diagnosis not present

## 2016-09-25 LAB — CBG MONITORING, ED: Glucose-Capillary: 229 mg/dL — ABNORMAL HIGH (ref 65–99)

## 2016-09-25 MED ORDER — HYDROXYZINE HCL 25 MG PO TABS
25.0000 mg | ORAL_TABLET | Freq: Once | ORAL | Status: AC
  Administered 2016-09-25: 25 mg via ORAL
  Filled 2016-09-25: qty 1

## 2016-09-25 NOTE — ED Provider Notes (Signed)
[facility] DEPT Provider Note   CSN: [id number] Arrival date & time: 09/24/16  2134     History   Chief Complaint Chief Complaint  Patient presents with  . Hyperglycemia    HPI Michael [name] is a 61 y.o. male.  Patient with history of bipolar disorder and schizophrenia presents to the emergency department with complaints of elevated blood sugar.Comes to the ER by ambulance. Prehospital blood sugar was 358. He reports that he has been taking his prescribed medications.  Patient is without complaints at arrival except for itching. He reports that he has been having itching on his abdomen, arms and back. He reports that this has been ongoing for "a minute". He denies chest pain, shortness of breath, abdominal pain, vomiting, diarrhea.      Past Medical History:  Diagnosis Date  . Anxiety   . Arthritis    "joints; shoulders; feet" (06/08/2016)  . Bipolar disorder ([place])   . Daily headache    "7:30 - 8:00 q night" (06/08/2016)  . Diabetic peripheral neuropathy ([place])   . GERD (gastroesophageal reflux disease)   . Pancreatitis   . Schizophrenia ([place])   . Seizures ([place]) 01/2016 X 2  . Type II diabetes mellitus [facility])     Patient Active Problem List   Diagnosis Date Noted  . Recurrent falls 07/07/2016  . Falls 06/18/2016  . Wernicke's encephalopathy   . Altered mental status 06/09/2016  . Bipolar disorder ([place]) 06/01/2016  . Diabetes mellitus due to underlying condition with ketoacidosis without coma, with long-term current use of insulin ([place]) 02/04/2016  . Dry skin 01/18/2016  . Peripheral vascular disease ([place]) 11/15/2015  . Tobacco abuse 09/02/2015  . Tricompartment osteoarthritis of both knees 06/15/2015  . Chronic abdominal pain 03/14/2015  . Vitamin D deficiency 03/12/2015  . Hyperlipidemia 02/17/2015  . Fall 12/24/2014  . Protein-calorie malnutrition, severe 11/18/2014  . Alcohol use disorder ([place])   . Cervical arthritis ([place]) 01/01/2014  . Health care  maintenance 08/25/2011  . Chronic alcoholic pancreatitis ([place]) 04/20/2011  . Type 2 diabetes mellitus with peripheral neuropathy ([place]) 12/09/2010  . HTN (hypertension) 12/09/2010    Past Surgical History:  Procedure Laterality Date  . BALLOON DILATION  03/22/2011   Procedure: BALLOON DILATION;  Surgeon: Gatha Mayer, MD;  Location: [facility] ENDOSCOPY;  Service: Endoscopy;  Laterality: N/A;  . COLONOSCOPY N/A 05/22/2012   Procedure: COLONOSCOPY;  Surgeon: Gatha Mayer, MD;  Location: [facility];  Service: Endoscopy;  Laterality: N/A;  . COLONOSCOPY W/ BIOPSIES AND POLYPECTOMY  09/12/11  . ESOPHAGOGASTRODUODENOSCOPY  03/22/2011   Procedure: ESOPHAGOGASTRODUODENOSCOPY (EGD);  Surgeon: Gatha Mayer, MD;  Location: [facility];  Service: Endoscopy;  Laterality: N/A;  egd with balloon   . EUS  04/20/2011   Procedure: UPPER ENDOSCOPIC ULTRASOUND (EUS) LINEAR;  Surgeon: Milus Banister, MD;  Location: [facility] ENDOSCOPY;  Service: Endoscopy;  Laterality: N/A;  . KNEE ARTHROSCOPY Left        Home Medications    Prior to Admission medications   Medication Sig Start Date End Date Taking? Authorizing Provider  ACCU-CHEK SOFTCLIX LANCETS lancets Use 3 times daily to check blood sugar. diag code E11.42. Insulin dependent 08/18/16  Yes Rice, Resa Miner, MD  Alcohol Swabs (B-D SINGLE USE SWABS REGULAR) PADS Patient uses 6 times a day. Patient tests 3 times per day and administers insulin 3 times a day. ICD 10 code E11.42 08/24/16  Yes Rice, Resa Miner, MD  Blood Glucose Calibration (ACCU-CHEK AVIVA) SOLN 1 drop  by In Vitro route as needed. diag code E08.10 insulin dependent. Use as device directs 08/25/16  Yes Rice, Resa Miner, MD  Blood Glucose Monitoring Suppl (ACCU-CHEK AVIVA PLUS) w/Device KIT 1 kit by Does not apply route every morning. 08/18/16  Yes Rice, Resa Miner, MD  ARIPiprazole (ABILIFY) 5 MG tablet Take 1 tablet (5 mg total) by mouth daily. 09/20/16   Rice, Resa Miner, MD  COMFORT EZ  PEN NEEDLES 31G X 5 MM [place]  07/24/16   [provider]  diclofenac sodium (VOLTAREN) 1 % GEL Apply 2 g topically 4 (four) times daily -  before meals and at bedtime. 06/22/16   Molt, Bethany, DO  DULoxetine (CYMBALTA) 60 MG capsule TAKE 1 CAPSULE EVERY DAY 08/10/16   Rice, Resa Miner, MD  folic acid (FOLVITE) 1 MG tablet Take 1 tablet (1 mg total) by mouth daily. 06/23/16   Molt, Bethany, DO  gabapentin (NEURONTIN) 300 MG capsule TAKE 3 CAPSULES BY MOUTH  EVERY MORNING AND  AFTERNOON. TAKE 4 CAPSULES  BY MOUTH AT NIGHT BEFORE  BED 03/10/16   Collier Salina, MD  glucose blood (ACCU-CHEK AVIVA PLUS) test strip Use 3 times daily to check blood sugar. diag code E11.42. Insulin dependent 06/15/16   Rice, Resa Miner, MD  hydrOXYzine (ATARAX/VISTARIL) 10 MG tablet Take 1 tablet (10 mg total) by mouth 3 (three) times daily as needed for itching. 08/18/16   Rice, Resa Miner, MD  ibuprofen (ADVIL,MOTRIN) 800 MG tablet Take 1 tablet (800 mg total) by mouth 3 (three) times daily as needed. 08/24/16   Rice, Resa Miner, MD  insulin aspart (NOVOLOG) 100 UNIT/ML injection Inject 5 units under the skin before breakfast and before dinner. Inject 10 units under the skin before lunch time Patient taking differently: Inject 5 units under the skin before breakfast and 15 units before dinner. Inject 10 units under the skin before lunch time 07/21/16   Collier Salina, MD  lipase/protease/amylase (CREON) 36000 UNITS CPEP capsule TAKE 1 CAPSULE BY MOUTH 3  TIMES DAILY BEFORE MEALS 03/02/16   Rice, Resa Miner, MD  Multiple Vitamin (MULTIVITAMIN WITH MINERALS) TABS tablet Take 1 tablet by mouth daily. 06/23/16   Molt, Bethany, DO  omega-3 acid ethyl esters (LOVAZA) 1 g capsule Take 1 g by mouth daily.  07/24/16   [provider]  pantoprazole (PROTONIX) 40 MG tablet Take 1 tablet (40 mg total) by mouth daily. 08/18/16   Collier Salina, MD  pravastatin (PRAVACHOL) 10 MG tablet Take 1 tablet (10 mg  total) by mouth daily at 6 PM. 08/18/16   Rice, Resa Miner, MD  thiamine (VITAMIN B-1) 100 MG tablet Take 1 tablet (100 mg total) by mouth daily. 06/15/16   Molt, Bethany, DO  UNABLE TO FIND Med Name: Med pass 120 mL by mouth 3 times daily    [provider]    Family History Family History  Problem Relation Age of Onset  . Hypertension Sister   . Diabetes Sister   . Heart disease Sister   . Colon cancer Neg Hx   . Stomach cancer Neg Hx   . Anesthesia problems Neg Hx   . Hypotension Neg Hx   . Pseudochol deficiency Neg Hx   . Malignant hyperthermia Neg Hx     Social History Social History  Substance Use Topics  . Smoking status: Current Every Day Smoker    Packs/day: 0.25    Years: 42.00    Types: Cigarettes  . Smokeless tobacco:  Never Used     Comment: 5 cigs per day  . Alcohol use 36.0 oz/week    50 Shots of liquor, 10 Standard drinks or equivalent per week     Comment: Occasionally.     Allergies   Ace inhibitors; Losartan; and Tylenol [acetaminophen]   Review of Systems Review of Systems  Skin:       itching  All other systems reviewed and are negative.    Physical Exam Updated Vital Signs BP 123/81 (BP Location: Left Arm)   Pulse 95   Temp 98.6 F (37 C) (Oral)   Resp 18   Ht '5\' 4"'$  (1.626 m)   Wt 72.6 kg (160 lb)   SpO2 100%   BMI 27.46 kg/m   Physical Exam  Constitutional: He is oriented to person, place, and time. He appears well-developed and well-nourished. No distress.  HENT:  Head: Normocephalic and atraumatic.  Right Ear: Hearing normal.  Left Ear: Hearing normal.  Nose: Nose normal.  Mouth/Throat: Oropharynx is clear and moist and mucous membranes are normal.  Eyes: Pupils are equal, round, and reactive to light. Conjunctivae and EOM are normal.  Neck: Normal range of motion. Neck supple.  Cardiovascular: Regular rhythm, S1 normal and S2 normal.  Exam reveals no gallop and no friction rub.   No murmur  heard. Pulmonary/Chest: Effort normal and breath sounds normal. No respiratory distress. He exhibits no tenderness.  Abdominal: Soft. Normal appearance and bowel sounds are normal. There is no hepatosplenomegaly. There is no tenderness. There is no rebound, no guarding, no tenderness at McBurney's point and negative Murphy's sign. No hernia.  Musculoskeletal: Normal range of motion.  Neurological: He is alert and oriented to person, place, and time. He has normal strength. No cranial nerve deficit or sensory deficit. Coordination normal. GCS eye subscore is 4. GCS verbal subscore is 5. GCS motor subscore is 6.  Skin: Skin is warm, dry and intact. No rash noted. No cyanosis.  Dry skin on abdomen, back and arms with slight superficial excoriations, no rash noted  Psychiatric: He has a normal mood and affect. His speech is normal and behavior is normal. Thought content normal.  Nursing note and vitals reviewed.    ED Treatments / Results  Labs (all labs ordered are listed, but only abnormal results are displayed) Labs Reviewed  BASIC METABOLIC PANEL - Abnormal; Notable for the following:       Result Value   Chloride 100 (*)    CO2 20 (*)    Glucose, Bld 266 (*)    BUN <5 (*)    Calcium 8.4 (*)    All other components within normal limits  URINALYSIS, ROUTINE W REFLEX MICROSCOPIC - Abnormal; Notable for the following:    Glucose, UA >=500 (*)    Ketones, ur 5 (*)    Bacteria, UA RARE (*)    Squamous Epithelial / LPF 0-5 (*)    All other components within normal limits  CBG MONITORING, ED - Abnormal; Notable for the following:    Glucose-Capillary 264 (*)    All other components within normal limits  CBG MONITORING, ED - Abnormal; Notable for the following:    Glucose-Capillary 229 (*)    All other components within normal limits  CBC    EKG  EKG Interpretation None       Radiology No results found.  Procedures Procedures (including critical care time)  Medications  Ordered in ED Medications  hydrOXYzine (ATARAX/VISTARIL) tablet 25 mg (not administered)  Initial Impression / Assessment and Plan / ED Course  I have reviewed the triage vital signs and the nursing notes.  Pertinent labs & imaging results that were available during my care of the patient were reviewed by me and considered in my medical decision making (see chart for details).     Patient presents with complaints of elevated blood sugar. Blood sugar is somewhat elevated, but has self corrected without intervention. He reports that he has been taking his medications as prescribed. Blood sugar likely elevated secondary to drinking beer earlier. No sign of DKA. He does not appear to be intoxicated. He is itching and he reports that this has been going on for about a year. There is no associated rash. Likely dry skin. Will treat with Vistaril, follow-up with primary care.  Final Clinical Impressions(s) / ED Diagnoses   Final diagnoses:  Hyperglycemia  Dry skin    New Prescriptions New Prescriptions   No medications on file     Orpah Greek, MD 09/25/16 [phone]

## 2016-09-25 NOTE — ED Notes (Signed)
Pt provided meal and beverage

## 2016-09-25 NOTE — ED Notes (Signed)
Per Dr. Betsey Holiday pt can eat

## 2016-09-28 ENCOUNTER — Encounter (INDEPENDENT_AMBULATORY_CARE_PROVIDER_SITE_OTHER): Payer: Self-pay

## 2016-09-28 ENCOUNTER — Ambulatory Visit (INDEPENDENT_AMBULATORY_CARE_PROVIDER_SITE_OTHER): Payer: Medicare HMO | Admitting: Orthopaedic Surgery

## 2016-09-28 DIAGNOSIS — G8929 Other chronic pain: Secondary | ICD-10-CM

## 2016-09-28 DIAGNOSIS — M25561 Pain in right knee: Secondary | ICD-10-CM

## 2016-09-28 DIAGNOSIS — M25562 Pain in left knee: Secondary | ICD-10-CM

## 2016-09-28 NOTE — Progress Notes (Signed)
rescheduled

## 2016-10-02 ENCOUNTER — Telehealth: Payer: Self-pay | Admitting: Internal Medicine

## 2016-10-02 MED ORDER — GLUCOSE BLOOD VI STRP: ORAL_STRIP | 12 refills | Status: DC

## 2016-10-02 NOTE — Telephone Encounter (Signed)
   Reason for call:   I received a call from [name] at 12:25 PM indicating he just ran out of his blood glucose test strips at 9pm and needs a refill.   Pertinent Data:   Patient denies any other acute concerns. States that he is only calling to request a refill.    Assessment / Plan / Recommendations:   Refill sent to [facility].   Encouraged patient to call his pharmacy to request electronic refills with 2 days notice in the future.   Pt was advised that if symptoms arise, they should go to an urgent care facility or to to ER for further evaluation.   [name], MD   10/02/2016, 12:25 AM

## 2016-10-03 ENCOUNTER — Other Ambulatory Visit: Payer: Self-pay

## 2016-10-03 NOTE — Telephone Encounter (Signed)
pravastatin (PRAVACHOL) 10 MG tablet  thiamine (VITAMIN B-1) 100 MG tablet, refill request @ [facility].

## 2016-10-05 ENCOUNTER — Other Ambulatory Visit: Payer: Self-pay | Admitting: Internal Medicine

## 2016-10-05 ENCOUNTER — Other Ambulatory Visit: Payer: Self-pay

## 2016-10-05 DIAGNOSIS — E1142 Type 2 diabetes mellitus with diabetic polyneuropathy: Secondary | ICD-10-CM

## 2016-10-05 MED ORDER — ACCU-CHEK SOFTCLIX LANCETS MISC: 3 refills | Status: DC

## 2016-10-05 MED ORDER — VITAMIN B-1 100 MG PO TABS: 100.0000 mg | ORAL_TABLET | Freq: Every day | ORAL | 0 refills | Status: DC

## 2016-10-05 MED ORDER — PRAVASTATIN SODIUM 10 MG PO TABS: 10.0000 mg | ORAL_TABLET | Freq: Every day | ORAL | 0 refills | Status: DC

## 2016-10-05 NOTE — Telephone Encounter (Signed)
NEED REFILL TESTING STRIPS, SEND TO [facility] [phone]

## 2016-10-05 NOTE — Telephone Encounter (Signed)
Requesting refill on test strip, and Lancets. Please call pt back.

## 2016-10-05 NOTE — Telephone Encounter (Signed)
this is done

## 2016-10-09 ENCOUNTER — Other Ambulatory Visit: Payer: Medicare Other

## 2016-10-09 ENCOUNTER — Other Ambulatory Visit: Payer: Self-pay | Admitting: *Deleted

## 2016-10-09 ENCOUNTER — Other Ambulatory Visit: Payer: Self-pay

## 2016-10-09 MED ORDER — GLUCOSE BLOOD VI STRP: ORAL_STRIP | 12 refills | Status: DC

## 2016-10-09 MED ORDER — IBUPROFEN 800 MG PO TABS: 800.0000 mg | ORAL_TABLET | Freq: Three times a day (TID) | ORAL | 0 refills | Status: DC | PRN

## 2016-10-09 NOTE — Telephone Encounter (Signed)
    Reason for call:   I received a call from [name] at 4:49 PM indicating below.   Pertinent Data:   Wanted refill of diabetes testing strips, and ibuprofen   Assessment / Plan / Recommendations:   He is feeling well overall  Refilled the above  As always, pt is advised that if symptoms worsen or new symptoms arise, they should go to an urgent care facility or to to ER for further evaluation.   Burgess Estelle, MD   10/09/2016, 4:44 PM

## 2016-10-12 ENCOUNTER — Other Ambulatory Visit: Payer: Self-pay | Admitting: Internal Medicine

## 2016-10-12 ENCOUNTER — Other Ambulatory Visit: Payer: Self-pay | Admitting: *Deleted

## 2016-10-12 DIAGNOSIS — E1142 Type 2 diabetes mellitus with diabetic polyneuropathy: Secondary | ICD-10-CM

## 2016-10-12 MED ORDER — GLUCOSE BLOOD VI STRP: ORAL_STRIP | 12 refills | Status: DC

## 2016-10-12 MED ORDER — ACCU-CHEK SOFTCLIX LANCETS MISC: 3 refills | Status: DC

## 2016-10-12 NOTE — Progress Notes (Signed)
   Reason for call:   I received a call from [name] at 6:15 PM indicating that he ran out of his test strips and warned them to send to [facility] .   Pertinent Data:   Patient is diabetic and check his blood sugar 3 times a day, he requested a refill of his test strips few days ago, refill was sent to [facility] at [facility] on 10/09/2016.patient wants [place] to recent a prescription to above-mentioned pharmacy located in [place].   Assessment / Plan / Recommendations:   A new prescription was sent to [facility].  As always, pt is advised that if symptoms worsen or new symptoms arise, they should go to an urgent care facility or to to ER for further evaluation.   Lorella Nimrod, MD   10/12/2016, 6:30 PM

## 2016-10-12 NOTE — Telephone Encounter (Signed)
Lancets was sent 9.13.18, test strips 9.17.18.  [facility] does not carry accu chek brand lancets. Stated they did not receive test strips- vo given for strips. Pharmacy also requesting creams/ointments per patient request. None of those mentioned meds were on medlist. Asked to fax [place] the request.   Called patient's phones but no answer.  Resending request for lancets.

## 2016-10-13 ENCOUNTER — Ambulatory Visit (INDEPENDENT_AMBULATORY_CARE_PROVIDER_SITE_OTHER): Payer: Medicare Other | Admitting: Internal Medicine

## 2016-10-13 ENCOUNTER — Encounter: Payer: Self-pay | Admitting: Internal Medicine

## 2016-10-13 VITALS — BP 159/88 | HR 111 | Temp 98.6°F | Ht 64.0 in | Wt 146.0 lb

## 2016-10-13 DIAGNOSIS — E860 Dehydration: Secondary | ICD-10-CM

## 2016-10-13 DIAGNOSIS — K5909 Other constipation: Secondary | ICD-10-CM

## 2016-10-13 DIAGNOSIS — L853 Xerosis cutis: Secondary | ICD-10-CM

## 2016-10-13 DIAGNOSIS — R3915 Urgency of urination: Secondary | ICD-10-CM

## 2016-10-13 DIAGNOSIS — R634 Abnormal weight loss: Secondary | ICD-10-CM

## 2016-10-13 DIAGNOSIS — K921 Melena: Secondary | ICD-10-CM

## 2016-10-13 DIAGNOSIS — R350 Frequency of micturition: Secondary | ICD-10-CM

## 2016-10-13 DIAGNOSIS — E1142 Type 2 diabetes mellitus with diabetic polyneuropathy: Secondary | ICD-10-CM

## 2016-10-13 DIAGNOSIS — E1165 Type 2 diabetes mellitus with hyperglycemia: Secondary | ICD-10-CM

## 2016-10-13 DIAGNOSIS — Z794 Long term (current) use of insulin: Secondary | ICD-10-CM

## 2016-10-13 DIAGNOSIS — E1140 Type 2 diabetes mellitus with diabetic neuropathy, unspecified: Secondary | ICD-10-CM | POA: Diagnosis not present

## 2016-10-13 LAB — POCT GLYCOSYLATED HEMOGLOBIN (HGB A1C): HEMOGLOBIN A1C: 11.6

## 2016-10-13 LAB — GLUCOSE, CAPILLARY: GLUCOSE-CAPILLARY: 373 mg/dL — AB (ref 65–99)

## 2016-10-13 NOTE — Patient Instructions (Addendum)
It was a pleasure to see you today Michael [name].  I think most of your symptoms today are from high blood sugar and dehydration due to this. I would like you to increase your dinner time insulin dose by 5 units. This makes a dose of 5 units, 10 units, and 10 units during the day at meals. You should also avoid any sodas or sweets and drink plenty of water. We will need to see you again very soon on Monday.  If we cannot get your sugar under better control we may need to have you come to the hospital for fluids and figuring out a better dose safely.

## 2016-10-13 NOTE — Progress Notes (Signed)
   CC: Hurting all over and frequent urination  HPI:  [name] is a 61 y.o. man here due to increased chronic pain in all extremities and now with very frequent urination he describes as 15 times per day.   See problem based assessment and plan below for additional details  Past Medical History:  Diagnosis Date  . Anxiety   . Arthritis    "joints; shoulders; feet" (06/08/2016)  . Bipolar disorder ([place])   . Daily headache    "7:30 - 8:00 q night" (06/08/2016)  . Diabetic peripheral neuropathy ([place])   . GERD (gastroesophageal reflux disease)   . Pancreatitis   . Schizophrenia ([place])   . Seizures ([place]) 01/2016 X 2  . Type II diabetes mellitus ([place])     Review of Systems:  Review of Systems  Constitutional: Positive for malaise/fatigue and weight loss. Negative for chills and fever.  HENT: Negative for hearing loss.   Eyes: Positive for blurred vision.  Respiratory: Negative for cough and shortness of breath.   Cardiovascular: Negative for leg swelling.  Gastrointestinal: Positive for blood in stool and constipation.  Genitourinary: Positive for frequency and urgency. Negative for dysuria and hematuria.  Musculoskeletal: Positive for joint pain.  Skin: Positive for itching.  Neurological: Negative for weakness.  Endo/Heme/Allergies: Positive for polydipsia.    Physical Exam: Physical Exam  Constitutional: He is oriented to person, place, and time and well-developed, well-nourished, and in no distress.  HENT:  Mouth/Throat: Oropharynx is clear and moist.  Cardiovascular: Normal rate and regular rhythm.   Pulmonary/Chest: Effort normal and breath sounds normal.  Abdominal: Soft.  Bilateral lower quadrant tenderness to palpation  Musculoskeletal: He exhibits no edema.  Neurological: He is alert and oriented to person, place, and time. Gait normal.  Skin: Skin is warm and dry. No rash noted.  Psychiatric:  Pressured speech    Vitals:   10/13/16 1344  BP: (!)  159/88  Pulse: (!) 111  Temp: 98.6 F (37 C)  TempSrc: Oral  SpO2: 100%  Weight: 146 lb (66.2 kg)  Height: 5\' 4"  (1.626 m)    Assessment & Plan:   See Encounters Tab for problem based charting.  Patient discussed with Dr. Evette Doffing

## 2016-10-14 LAB — BMP8+ANION GAP
ANION GAP: 15 mmol/L (ref 10.0–18.0)
BUN/Creatinine Ratio: 10 (ref 9–20)
BUN: 11 mg/dL (ref 6–24)
CALCIUM: 9.1 mg/dL (ref 8.7–10.2)
CO2: 25 mmol/L (ref 20–29)
CREATININE: 1.05 mg/dL (ref 0.76–1.27)
Chloride: 100 mmol/L (ref 96–106)
GFR calc Af Amer: 90 mL/min/{1.73_m2} (ref >59)
GFR, EST NON AFRICAN AMERICAN: 78 mL/min/{1.73_m2} (ref >59)
Glucose: 340 mg/dL — ABNORMAL HIGH (ref 65–99)
POTASSIUM: 4.6 mmol/L (ref 3.5–5.2)
Sodium: 140 mmol/L (ref 134–144)

## 2016-10-14 LAB — CBC
HEMOGLOBIN: 15.1 g/dL (ref 13.0–17.7)
Hematocrit: 46.4 % (ref 37.5–51.0)
MCH: 31.7 pg (ref 26.6–33.0)
MCHC: 32.5 g/dL (ref 31.5–35.7)
MCV: 97 fL (ref 79–97)
Platelets: 189 10*3/uL (ref 150–379)
RBC: 4.77 x10E6/uL (ref 4.14–5.80)
RDW: 14.3 % (ref 12.3–15.4)
WBC: 8.7 10*3/uL (ref 3.4–10.8)

## 2016-10-15 ENCOUNTER — Telehealth: Payer: Self-pay | Admitting: Internal Medicine

## 2016-10-15 DIAGNOSIS — E1142 Type 2 diabetes mellitus with diabetic polyneuropathy: Secondary | ICD-10-CM

## 2016-10-15 MED ORDER — VITAMIN B-1 100 MG PO TABS: 100.0000 mg | ORAL_TABLET | Freq: Every day | ORAL | 0 refills | Status: DC

## 2016-10-15 MED ORDER — DICLOFENAC SODIUM 1 % TD GEL: 2.0000 g | Freq: Three times a day (TID) | TRANSDERMAL | 0 refills | Status: DC

## 2016-10-15 MED ORDER — PANCRELIPASE (LIP-PROT-AMYL) 36000-114000 UNITS PO CPEP: ORAL_CAPSULE | ORAL | 1 refills | Status: DC

## 2016-10-15 MED ORDER — OMEGA-3-ACID ETHYL ESTERS 1 G PO CAPS: 1.0000 g | ORAL_CAPSULE | Freq: Every day | ORAL | 3 refills | Status: DC

## 2016-10-15 MED ORDER — HYDROXYZINE HCL 10 MG PO TABS: 10.0000 mg | ORAL_TABLET | Freq: Three times a day (TID) | ORAL | 0 refills | Status: DC | PRN

## 2016-10-15 MED ORDER — IBUPROFEN 800 MG PO TABS: 800.0000 mg | ORAL_TABLET | Freq: Three times a day (TID) | ORAL | 0 refills | Status: DC | PRN

## 2016-10-15 MED ORDER — GLUCOSE BLOOD VI STRP: ORAL_STRIP | 12 refills | Status: DC

## 2016-10-15 MED ORDER — ACCU-CHEK AVIVA PLUS W/DEVICE KIT: 1.0000 | PACK | Freq: Every morning | 0 refills | Status: DC

## 2016-10-15 MED ORDER — DULOXETINE HCL 60 MG PO CPEP: 60.0000 mg | ORAL_CAPSULE | Freq: Every day | ORAL | 1 refills | Status: DC

## 2016-10-15 MED ORDER — ADULT MULTIVITAMIN W/MINERALS CH: 1.0000 | ORAL_TABLET | Freq: Every day | ORAL | 0 refills | Status: DC

## 2016-10-15 MED ORDER — INSULIN ASPART 100 UNIT/ML ~~LOC~~ SOLN: SUBCUTANEOUS | 5 refills | Status: DC

## 2016-10-15 NOTE — Telephone Encounter (Signed)
Called by Mr. Lea requesting medication refills.    Broken meter at home, requesting new Accu-chek meter.   Also requesting refill on Omega 3, thiamine, duloxetine, creon, MVI, test strips, hydroxyzine, ibuprofen, novolog and voltaren gel to [facility] on [address].   Will refill for him today.

## 2016-10-16 ENCOUNTER — Ambulatory Visit: Payer: Medicaid Other

## 2016-10-16 DIAGNOSIS — K5909 Other constipation: Secondary | ICD-10-CM | POA: Insufficient documentation

## 2016-10-16 NOTE — Progress Notes (Signed)
Internal Medicine Clinic Attending  Case discussed with Dr. Rice at the time of the visit.  We reviewed the resident's history and exam and pertinent patient test results.  I agree with the assessment, diagnosis, and plan of care documented in the resident's note.  

## 2016-10-16 NOTE — Assessment & Plan Note (Signed)
HPI: He notes increased constipation. Although he is having a bowel movement almost every day, he strains to pass very hard stools with some bright red blood in them. He typically suffers from chronic diarrhea so this is abnormal for him. This has been ongoing for a few weeks without significantly worsening or improving. He does have some lower abdominal pain along with the constipation.  A: Constipation is most likely due to dehydration from his uncontrolled diabetes at this time. I do not think any specific intervention is needed, and he chronically has diarrhea due to his pancreatitis. I will check a CBC today to be sure there is no significant blood loss out of proportion to what would result from straining alone.  P: Check CBC Recommended increased fluid intake of non-sugar containing beverages

## 2016-10-16 NOTE — Assessment & Plan Note (Signed)
HPI: He is experiencing polydipsia, urinary frequency, fatigue, and blurry vision over the past 3 weeks at least and is very frustrated by these symptoms. He was seen at the Emergency department 3 weeks ago where he was noted to be hyperglycemic in the 300s with a primary complaint of pruritis. He received no treatment at the time but was noted to have been drinking alcohol. He also reports frequent drinking of sodas but denies eating many sweetened snacks. Since this time his symptoms have continued. His weight is decreased from 153 to 146 lbs per chart review and he reports a greater than 10 lb weight loss.  A: He is currently symptomatic from hyperglycemia with associated dehydration due to ongoing osmotic diuresis. I suspect this explains all of his worsened symptoms today of fatigue, constipation, polydipsia, polyuria, pruritis, and increased chronic pain which is combination of arthritis and neuropathy. His insulin dose decreased from 9 units TID to 5,10,5 units due to episodes of symptomatic hypoglycemia earlier this year. He is a very brittle diabetic due to his chronic pancreatic insufficiency and this is complicated by large daily variation in his diet. His Hgb A1c reflects this undertreatment at 11.6 today.  He is not in DKA and is not suffering from a significant kidney injury due to this dehydration at this time. Michael [name] declined hospital admission for IV fluids and insulin titration for the safest resolution of his symptoms.  P: -Increase insulin dose at dinner to 10 units (5,10,10 units) -I counseled him again on avoiding regular soda and trying to reduce alcohol use as large sources of sugars -Basic metabolic panel checked today -He agreed to return to clinic Monday 9/24 for reassessment and if symptoms or glycemic control are not improving we will titrate his daily insulin dose up to 28 units total daily dose, timing of the increase can be based on his reported home CBG  measurements. -If he remains severely uncontrolled or worsening at that time admission for IV fluids and titration may be appropriate.

## 2016-10-16 NOTE — Assessment & Plan Note (Signed)
HPI: He continues to have diffuse itching on all extremities. There are no visible skin changes. He has had partial improvement in his symptoms with hydroxyzine. He also uses vaseline on the affected areas without significant benefit.  A: Xerosis currently in exacerbation due to dehydration and maybe from increased neuropathy symptoms from uncontrolled diabetes. I do not appreciate any rash to suggest any new process.  P: Continue hydroxyzine 10mg  TID PRN at this time

## 2016-10-17 ENCOUNTER — Encounter (INDEPENDENT_AMBULATORY_CARE_PROVIDER_SITE_OTHER): Payer: Self-pay | Admitting: *Deleted

## 2016-10-17 ENCOUNTER — Telehealth: Payer: Self-pay | Admitting: *Deleted

## 2016-10-17 ENCOUNTER — Encounter: Payer: Self-pay | Admitting: *Deleted

## 2016-10-17 DIAGNOSIS — E1142 Type 2 diabetes mellitus with diabetic polyneuropathy: Secondary | ICD-10-CM

## 2016-10-17 MED ORDER — INSULIN ASPART 100 UNIT/ML FLEXPEN: PEN_INJECTOR | SUBCUTANEOUS | 3 refills | Status: DC

## 2016-10-17 NOTE — Telephone Encounter (Signed)
I have placed a new order for the novolog as pens at the same dose.

## 2016-10-17 NOTE — Telephone Encounter (Signed)
Information was sent to CoverMyMeds for PA for Diclofenac Gel.  Approved through 01/22/2017. [place] [phone].  Call to [facility] informed of PA.  Sander Nephew, RN 10/17/2016 11:58 AM.

## 2016-10-17 NOTE — Telephone Encounter (Signed)
Please change the novolog vials to pens thanks

## 2016-10-19 ENCOUNTER — Other Ambulatory Visit: Payer: Self-pay | Admitting: *Deleted

## 2016-10-21 ENCOUNTER — Encounter (HOSPITAL_COMMUNITY): Payer: Self-pay | Admitting: Emergency Medicine

## 2016-10-21 ENCOUNTER — Emergency Department (HOSPITAL_COMMUNITY)
Admission: EM | Admit: 2016-10-21 | Discharge: 2016-10-21 | Disposition: A | Discharge status: Home / Self Care | Payer: Medicare Other | Attending: Emergency Medicine | Admitting: Emergency Medicine

## 2016-10-21 DIAGNOSIS — Z5321 Procedure and treatment not carried out due to patient leaving prior to being seen by health care provider: Secondary | ICD-10-CM | POA: Diagnosis not present

## 2016-10-21 DIAGNOSIS — R52 Pain, unspecified: Secondary | ICD-10-CM | POA: Diagnosis not present

## 2016-10-21 LAB — CBG MONITORING, ED: GLUCOSE-CAPILLARY: 350 mg/dL — AB (ref 65–99)

## 2016-10-21 NOTE — ED Notes (Signed)
Unable to update vitals. PT did not respond to name

## 2016-10-21 NOTE — ED Triage Notes (Signed)
Pt brought in by [facility]   Pt would not tell them why he wanted to be transported  Pt states he has pain all over  Pt has psych history but states he does not wish to talk to anyone here about that   He states he wants to know what is causing his pain

## 2016-10-26 ENCOUNTER — Ambulatory Visit
Admission: RE | Admit: 2016-10-26 | Discharge: 2016-10-26 | Disposition: A | Discharge status: Home / Self Care | Payer: Medicare Other | Source: Ambulatory Visit | Attending: Orthopaedic Surgery | Admitting: Orthopaedic Surgery

## 2016-10-26 DIAGNOSIS — M25561 Pain in right knee: Principal | ICD-10-CM

## 2016-10-26 DIAGNOSIS — M25562 Pain in left knee: Principal | ICD-10-CM

## 2016-10-26 DIAGNOSIS — G8929 Other chronic pain: Secondary | ICD-10-CM

## 2016-10-29 ENCOUNTER — Telehealth: Payer: Self-pay | Admitting: Internal Medicine

## 2016-10-29 MED ORDER — GLUCOSE BLOOD VI STRP: ORAL_STRIP | 12 refills | Status: DC

## 2016-10-29 NOTE — Telephone Encounter (Signed)
   Reason for call:   I received a call from [name] at 5:10 PM indicating he was out of test strips and that he continues to have loose bowel movements.   Pertinent Data:   Patient requests refill for test strips to be sent to [facility].  Patient states that for the past year, he has had multiple loose bowel movements with intermittent mild blood per rectum and on tissue paper (1-2 times per week). He states that he discussed this with Dr. Benjamine [name] last week - documentation from this appointment indicated that he had constipation but patient denies. He states his bowel movements haven't changed all year and are currently not worse, nor is the amount of blood worse. CBC checked last week showed Hgb of 15.1 and previous CBCs since march of this year are all wnl in terms of Hgb. He endorses compliance with [facility] TID.    Assessment / Plan / Recommendations:   Refill sent for test strips  I messaged [facility] front desk to call patient to set up appointment for this week to evaluate his chronic diarrhea and BRB per rectum - patient in agreement with this plan. As this appears to be chronic and unchanged, and patient had stable labs last week, I do not think it is necessary for him to come to the ED for more urgent evaluation.  As always, pt is advised that if symptoms worsen or new symptoms arise, they should go to an urgent care facility or to to ER for further evaluation.   Michael Grieve, MD   10/29/2016, 5:08 PM

## 2016-10-30 ENCOUNTER — Telehealth: Payer: Self-pay

## 2016-10-30 ENCOUNTER — Other Ambulatory Visit: Payer: Self-pay | Admitting: Internal Medicine

## 2016-10-30 MED ORDER — GLUCOSE BLOOD VI STRP: ORAL_STRIP | 12 refills | Status: DC

## 2016-10-30 NOTE — Telephone Encounter (Signed)
Requesting to speak with a nurse about test strip. Please call pt back.

## 2016-10-31 ENCOUNTER — Ambulatory Visit (INDEPENDENT_AMBULATORY_CARE_PROVIDER_SITE_OTHER): Payer: Medicare Other | Admitting: Orthopaedic Surgery

## 2016-10-31 ENCOUNTER — Other Ambulatory Visit: Payer: Self-pay | Admitting: Internal Medicine

## 2016-10-31 DIAGNOSIS — M1711 Unilateral primary osteoarthritis, right knee: Secondary | ICD-10-CM | POA: Diagnosis not present

## 2016-10-31 DIAGNOSIS — M1712 Unilateral primary osteoarthritis, left knee: Secondary | ICD-10-CM

## 2016-10-31 NOTE — Progress Notes (Signed)
Office Visit Note   Patient: Michael [name]           Date of Birth: [birth year]           MRN: [id number] Visit Date: 10/31/2016              Requested by: Collier Salina, MD [place], [place] [id number] PCP: Collier Salina, MD   Assessment & Plan: Visit Diagnoses:  1. Unilateral primary osteoarthritis, left knee   2. Unilateral primary osteoarthritis, right knee     Plan: Patient essentially has bilateral degenerative joint disease worse on the left based on the MRI. Patient has failed conservative treatment he is interested in undergoing total knee replacement. However his hemoglobin A1c is 11.6 from 2 weeks ago. This would need to be below 8 before we can proceed. I've asked him to see his primary care doctor about better diabetic control. He can give [place] a call once this has been accomplished and we can start the process of scheduling him for knee replacement. Total face to face encounter time was greater than 25 minutes and over half of this time was spent in counseling and/or coordination of care.  Follow-Up Instructions: Return if symptoms worsen or fail to improve.   Orders:  No orders of the defined types were placed in this encounter.  No orders of the defined types were placed in this encounter.     Procedures: No procedures performed   Clinical Data: No additional findings.   Subjective: Chief Complaint  Patient presents with  . Right Knee - Pain  . Left Knee - Pain    Patient comes in today to review his MRI of his bilateral knees.    Review of Systems  Constitutional: Negative.   All other systems reviewed and are negative.    Objective: Vital Signs: There were no vitals taken for this visit.  Physical Exam  Constitutional: He is oriented to person, place, and time. He appears well-developed and well-nourished.  Pulmonary/Chest: Effort normal.  Abdominal: Soft.  Neurological: He is alert and oriented to person, place,  and time.  Skin: Skin is warm.  Psychiatric: He has a normal mood and affect. His behavior is normal. Judgment and thought content normal.  Nursing note and vitals reviewed.   Ortho Exam Bilateral knee exam are stable Specialty Comments:  No specialty comments available.  Imaging: No results found.   PMFS History: Patient Active Problem List   Diagnosis Date Noted  . Other constipation 10/16/2016  . Recurrent falls 07/07/2016  . Wernicke's encephalopathy   . Altered mental status 06/09/2016  . Bipolar disorder ([place]) 06/01/2016  . Dry skin 01/18/2016  . Peripheral vascular disease ([place]) 11/15/2015  . Hyperglycemia 09/02/2015  . Tobacco abuse 09/02/2015  . Tricompartment osteoarthritis of both knees 06/15/2015  . Vitamin D deficiency 03/12/2015  . Hyperlipidemia 02/17/2015  . Protein-calorie malnutrition, severe 11/18/2014  . Alcohol use disorder   . Cervical arthritis 01/01/2014  . Health care maintenance 08/25/2011  . Chronic alcoholic pancreatitis ([place]) 04/20/2011  . Type 2 diabetes mellitus with peripheral neuropathy ([place]) 12/09/2010  . HTN (hypertension) 12/09/2010   Past Medical History:  Diagnosis Date  . Anxiety   . Arthritis    "joints; shoulders; feet" (06/08/2016)  . Bipolar disorder ([place])   . Daily headache    "7:30 - 8:00 q night" (06/08/2016)  . Diabetic peripheral neuropathy ([place])   . GERD (gastroesophageal reflux disease)   . Pancreatitis   .  Schizophrenia ([place])   . Seizures ([place]) 01/2016 X 2  . Type II diabetes mellitus ([facility])     Family History  Problem Relation Age of Onset  . Hypertension Sister   . Diabetes Sister   . Heart disease Sister   . Colon cancer Neg Hx   . Stomach cancer Neg Hx   . Anesthesia problems Neg Hx   . Hypotension Neg Hx   . Pseudochol deficiency Neg Hx   . Malignant hyperthermia Neg Hx     Past Surgical History:  Procedure Laterality Date  . BALLOON DILATION  03/22/2011   Procedure: BALLOON DILATION;  Surgeon: Gatha Mayer, MD;  Location: [facility];  Service: Endoscopy;  Laterality: N/A;  . COLONOSCOPY N/A 05/22/2012   Procedure: COLONOSCOPY;  Surgeon: Gatha Mayer, MD;  Location: [facility];  Service: Endoscopy;  Laterality: N/A;  . COLONOSCOPY W/ BIOPSIES AND POLYPECTOMY  09/12/11  . ESOPHAGOGASTRODUODENOSCOPY  03/22/2011   Procedure: ESOPHAGOGASTRODUODENOSCOPY (EGD);  Surgeon: Gatha Mayer, MD;  Location: [facility];  Service: Endoscopy;  Laterality: N/A;  egd with balloon   . EUS  04/20/2011   Procedure: UPPER ENDOSCOPIC ULTRASOUND (EUS) LINEAR;  Surgeon: Milus Banister, MD;  Location: [facility];  Service: Endoscopy;  Laterality: N/A;  . KNEE ARTHROSCOPY Left    Social History   Occupational History  .    [employer]    worked for 6 years  . 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 Smoking status: Current Every Day Smoker    Packs/day: 0.25    Years: 42.00    Types: Cigarettes  . Smokeless tobacco: Never Used     Comment: 5 cigs per day  . Alcohol use 36.0 oz/week    50 Shots of liquor, 10 Standard drinks or equivalent per week     Comment: Occasionally.  . Drug use: Yes    Types: Marijuana     Comment: 06/08/2016  "maybe once/week"  . Sexual activity: Yes

## 2016-10-31 NOTE — Telephone Encounter (Signed)
pls call the patient

## 2016-10-31 NOTE — Telephone Encounter (Signed)
Pt asking about refill on test strips; informed rx was sent yesterday to [facility]. States he went to see Ortho and was told to work on his A1C before having the surgery.

## 2016-11-01 ENCOUNTER — Other Ambulatory Visit: Payer: Self-pay | Admitting: Internal Medicine

## 2016-11-01 MED ORDER — HYDROXYZINE HCL 10 MG PO TABS: 10.0000 mg | ORAL_TABLET | Freq: Three times a day (TID) | ORAL | 2 refills | Status: DC | PRN

## 2016-11-01 NOTE — Telephone Encounter (Signed)
Pt was calling about hydrOXYzine refill-which was authorized earlier today.  No further action needed, phone call complete.Michael Hidden Cassady10/10/20184:06 PM

## 2016-11-01 NOTE — Telephone Encounter (Signed)
Pt requesting callback regarding diabetes medicine

## 2016-11-08 ENCOUNTER — Telehealth: Payer: Self-pay | Admitting: *Deleted

## 2016-11-08 NOTE — Telephone Encounter (Signed)
This has been a repeated problem. He is getting additional refills placed when calling the overnight pager. I will place a note in the chart hopefully for care coordination.

## 2016-11-08 NOTE — Telephone Encounter (Signed)
Patient called with different complaints.  Requesting refill for test strips- said he has to check bld sugars more often because he's been using bathroom more frequently due to diarrhea.  Informed that he has plenty of test strips ordered, one was 10/30/16 to [facility] pharmacy but said he's not aware of it. Advised to call this pharmacy & agreed. Also talked about having pain & meds not helping him.  Aware of his appt in Nov.

## 2016-11-08 NOTE — Telephone Encounter (Signed)
PHARMACIST AT [facility] HAS GOTTEN ENOUGH STRIPS FROM THEIR PHARM TO LAST TIL DEC ALSO JUST APPROVED TEST STRIPS FOR [facility] [place] 10/8 #300 W/ 12 REFILLS- THIS IS ENOUGH TO LAST 3 YEARS. THIS IS A FYI

## 2016-11-09 ENCOUNTER — Ambulatory Visit: Payer: Self-pay

## 2016-11-14 ENCOUNTER — Other Ambulatory Visit: Payer: Self-pay | Admitting: Internal Medicine

## 2016-11-14 NOTE — Telephone Encounter (Signed)
Pt called back - requesting rx for test strips sent to [facility] w/year supply. Informed pt rx sent to [facility] w/year supply on 10/8 - and he could call and transfer rx ; state ok.

## 2016-11-14 NOTE — Telephone Encounter (Signed)
Called pt about refill on test strips - starting c/o blood in stools, Dr Benjamine Mola "not doing anything for me - I'm tired of hurting" ; he has an appt Friday in [facility]. Informed he has refills on test strips at [facility]; stated ok.

## 2016-11-14 NOTE — Telephone Encounter (Signed)
Patient would like to a presciption sent to [facility] for test strip, pls today. Pls call patient

## 2016-11-16 ENCOUNTER — Telehealth: Payer: Self-pay

## 2016-11-16 NOTE — Telephone Encounter (Signed)
Per patient the test strips is not at the pharmacy. Please call pt back.

## 2016-11-16 NOTE — Telephone Encounter (Signed)
No answer, no vmail. Pt has enough strips to last thru December.

## 2016-11-17 ENCOUNTER — Ambulatory Visit: Payer: Self-pay

## 2016-11-17 NOTE — Telephone Encounter (Signed)
No answer

## 2016-11-21 ENCOUNTER — Telehealth: Payer: Self-pay | Admitting: *Deleted

## 2016-11-21 NOTE — Telephone Encounter (Signed)
Patient called yelling demanding to speak to Dr. Benjamine Mola to get order for test strips. He was talking so fast & will not let me talk. He said he is running out of test strips bec he has to check his bld sugar 5x/day now. He demanded I call [facility] while he waits on the phone.  I called [facility] & conference call with patient as well. Test strips were ordered 10/30/16 #300, 12 refills.  I was informed that test strips were successfully transferred to their pharmacy but will not start until Dec 1st. The other issue is patient now testing more frequently. Patient need strips until that time (1 month supply). Patient stated he is checking blood sugar 5x/ day now as opposed to 3x/d as ordered bec he has been sick . Vo given for 1 month supply test strips, is this ok? Patient finally calmed down after conference call.

## 2016-11-22 NOTE — Telephone Encounter (Signed)
Sorry for delayed response, thank you all for helping take care of this. I am unsure why he is needing to test 5 times daily since I never recommended this, he is taking short acting insulin 3 times daily and recommended to test around those times. I had recommended sending new test strip requests to be due to pharmacy complaints/concerns. I am fine with the one month plan at this time.  I would like to see him soon in clinic though as he had worsened diabetes control and symptoms of hyperglycemia at my last visit in September.

## 2016-11-22 NOTE — Telephone Encounter (Signed)
I read Dr Marveen Reeks 9/21 note and he intended to intensify DM mgmt. I did not read testing 5x / day but seems c/w Dr Rice's intent. Pt to return 924 but cancelled then no showed. Now has Nov appt. Dr Benjamine Mola wanted very close F/U - not 6 weeks later. Pls remind pt of Nov appt, need to bring meter, and all med bottles.  Than you for addressing this and calming pt.

## 2016-11-27 ENCOUNTER — Telehealth: Payer: Self-pay | Admitting: *Deleted

## 2016-11-27 NOTE — Telephone Encounter (Signed)
FYIKennyth Lose ([facility]- conducts visit once/year) called stating patient's urine dipstick showed 4+ glucose. Patient also c/o about pain& is very angry. She stated patient aware of appt on 11/30/16 & plans to show up.

## 2016-11-30 ENCOUNTER — Other Ambulatory Visit: Payer: Self-pay

## 2016-11-30 ENCOUNTER — Ambulatory Visit (INDEPENDENT_AMBULATORY_CARE_PROVIDER_SITE_OTHER): Payer: Medicare Other | Admitting: Internal Medicine

## 2016-11-30 ENCOUNTER — Telehealth: Payer: Self-pay | Admitting: Internal Medicine

## 2016-11-30 ENCOUNTER — Encounter: Payer: Self-pay | Admitting: Internal Medicine

## 2016-11-30 ENCOUNTER — Other Ambulatory Visit: Payer: Self-pay | Admitting: *Deleted

## 2016-11-30 VITALS — BP 135/76 | HR 98 | Temp 98.1°F | Ht 64.0 in | Wt 139.5 lb

## 2016-11-30 DIAGNOSIS — Z794 Long term (current) use of insulin: Secondary | ICD-10-CM

## 2016-11-30 DIAGNOSIS — Z23 Encounter for immunization: Secondary | ICD-10-CM

## 2016-11-30 DIAGNOSIS — K8689 Other specified diseases of pancreas: Secondary | ICD-10-CM | POA: Diagnosis not present

## 2016-11-30 DIAGNOSIS — Z0000 Encounter for general adult medical examination without abnormal findings: Secondary | ICD-10-CM

## 2016-11-30 DIAGNOSIS — E1142 Type 2 diabetes mellitus with diabetic polyneuropathy: Secondary | ICD-10-CM

## 2016-11-30 LAB — GLUCOSE, CAPILLARY: GLUCOSE-CAPILLARY: 130 mg/dL — AB (ref 65–99)

## 2016-11-30 MED ORDER — IBUPROFEN 800 MG PO TABS: 800.0000 mg | ORAL_TABLET | Freq: Three times a day (TID) | ORAL | 0 refills | Status: DC | PRN

## 2016-11-30 NOTE — Telephone Encounter (Signed)
Patient checking to see if prescr. Was sent to the pharamacy

## 2016-11-30 NOTE — Patient Instructions (Signed)
  Michael [name] , Thank you for taking time to come for your Medicare Wellness Visit. I appreciate your ongoing commitment to your health goals. Please review the following plan we discussed and let me know if I can assist you in the future.   These are the goals we discussed: Goals    . Blood Pressure < 140/90    . Blood Pressure < 140/90    . HEMOGLOBIN A1C < 7.0    . HEMOGLOBIN A1C < 7.0    . LDL CALC < 100    . LDL CALC < 100       This is a list of the screening recommended for you and due dates:  Health Maintenance  Topic Date Due  . Lipid (cholesterol) test  01/20/2015  . Pneumococcal vaccine (2) 11/14/2015  . Flu Shot  08/23/2016  . Hemoglobin A1C  01/12/2017  . Complete foot exam   01/13/2017  . Urine Protein Check  02/01/2017  . Colon Cancer Screening  05/22/2017  . Eye exam for diabetics  07/12/2017  . Tetanus Vaccine  08/24/2021  .  Hepatitis C: One time screening is recommended by Center for Disease Control  (CDC) for  adults born from [birth year] through 1963.   Completed  . HIV Screening  Completed    I would like to see you back in 1-2 weeks with your blood glucose meter so we can adjust your insulin and get it lower so you could get better treatments for your chronic arthritis pain.

## 2016-11-30 NOTE — Progress Notes (Signed)
Subjective:   Michael [name] is a 61 y.o. male who presents for a Medicare Annual Wellness Visit.  The following items have been reviewed and updated today in the appropriate area in the EMR.   Health Risk Assessment  Height, weight, BMI, and BP Visual acuity if needed Depression screen Fall risk / safety level Advance directive discussion Medical and family history were reviewed and updated Updating list of other providers & suppliers Medication reconciliation, including over the counter medicines Cognitive screen Written screening schedule Risk Factor list Personalized health advice, risky behaviors, and treatment advice     Objective:    Vitals: BP 135/76 (BP Location: Left Arm, Patient Position: Sitting, Cuff Size: Normal)   Pulse 98   Temp 98.1 F (36.7 C) (Oral)   Ht 5\' 4"  (1.626 m)   Wt 139 lb 8 oz (63.3 kg)   SpO2 100% Comment: RA  BMI 23.95 kg/m   Activities of Daily Living In your present state of health, do you have any difficulty performing the following activities: 11/30/2016 10/13/2016  Hearing? N N  Vision? Y Y  Difficulty concentrating or making decisions? N N  Walking or climbing stairs? Y Y  Comment - back pain  Dressing or bathing? N N  Doing errands, shopping? N N  Comment - -  Some encounter information is confidential and restricted. Go to Review Flowsheets activity to see all data.  Some recent data might be hidden    Goals Goals    . Blood Pressure < 140/90    . Blood Pressure < 140/90    . HEMOGLOBIN A1C < 7.0    . HEMOGLOBIN A1C < 7.0    . LDL CALC < 100    . LDL CALC < 100       Fall Risk Fall Risk  11/30/2016 07/21/2016 07/07/2016 02/25/2016 02/02/2016  Falls in the past year? Yes Yes Yes No No  Comment - - - - -  Number falls in past yr: 2 or more 2 or more 2 or more - -  Comment - - - - -  Injury with Fall? Yes Yes Yes - -  Comment - - - - -  Risk Factor Category  High Fall Risk High Fall Risk High Fall Risk - -  Risk for  fall due to : Impaired balance/gait;Other (Comment) Impaired balance/gait;Impaired mobility Impaired balance/gait;Impaired mobility;History of fall(s) - -  Risk for fall due to: Comment Blacked out. - - - -  Follow up Falls prevention discussed - Falls prevention discussed - -    Depression Screen PHQ 2/9 Scores 11/30/2016 07/21/2016 07/07/2016 02/25/2016  PHQ - 2 Score 0 0 0 0  PHQ- 9 Score - - - -     Cognitive Testing I assessed the patient for cognitive issues and the patient did not have issues with his / her cognition scoring 5/5 on MiniCog.   Assessment and Plan:    During the course of the visit the patient was educated and counseled about appropriate screening and preventive services as documented in the assessment and plan.  I counseled Mr. Hammen several minutes again on reducing alcohol use for history of complications, diabetes control, and fall risk. His drinking also makes him very high risk for additional modalities for pain treatment of his extensive osteoarthritis and neuropthy  Added physicians to care teams, additionally Ms. Green psychology group sessions at [facility]. It is unclear who his psychiatry provider is, and he is working to get  established for dental care  The printed AVS was given to the patient and included an updated screening schedule, a list of risk factors, and personalized health advice.    Health care maintenance Flu vaccine given today  Type 2 diabetes mellitus with peripheral neuropathy ([place]) He has difficult to control diabetes due to severe chronic pancreatic insufficiency. This makes him high risk for both hypoglycemia and hyperglycemia. He is currently on only prandial insulin doses due to severe hypoglycemia including seizures on long acting insulin. He has received recommendation by orthopedic surgery that he would be a candidate for knee replacement surgery but not if his Hgb A1c remains above 8.0%.He states he is checking up to 4 times a day  even though this is not needed, my recommendation was 3 times daily.  I discussed today that we will need more frequent visits and for him to start bringing his glucometer to visits in order to safely titrate his medications. He agreed to return later this month with his meter so we can adjust his doses.     Hinton Lovely, MD  12/05/2016

## 2016-12-01 ENCOUNTER — Telehealth: Payer: Self-pay | Admitting: *Deleted

## 2016-12-01 NOTE — Telephone Encounter (Signed)
Spoke to pt, he is not due to receive test strips at this time, request for IBU sent and approved

## 2016-12-01 NOTE — Telephone Encounter (Signed)
I reassured him yesterday that he does not need to check 5 times daily as he reported doing. I informed him that 3 times daily was adequate monitoring and it was more important that he bring the data to review instead of checking extra times. He still has not brought his glucometer for review.  Thanks for responding to them.

## 2016-12-01 NOTE — Telephone Encounter (Signed)
[facility] calls and states pt called and stated dr rice has told him to check his blood sugar at least 6 times daily. She was informed pt needs to test 3 times daily.

## 2016-12-05 NOTE — Assessment & Plan Note (Signed)
Flu vaccine given today. 

## 2016-12-05 NOTE — Progress Notes (Signed)
Internal Medicine Clinic Attending  Case discussed with Dr. Rice at the time of the visit.  We reviewed the resident's history and exam and pertinent patient test results.  I agree with the assessment, diagnosis, and plan of care documented in the resident's note.  

## 2016-12-05 NOTE — Assessment & Plan Note (Signed)
He has difficult to control diabetes due to severe chronic pancreatic insufficiency. This makes him high risk for both hypoglycemia and hyperglycemia. He is currently on only prandial insulin doses due to severe hypoglycemia including seizures on long acting insulin. He has received recommendation by orthopedic surgery that he would be a candidate for knee replacement surgery but not if his Hgb A1c remains above 8.0%.He states he is checking up to 4 times a day even though this is not needed, my recommendation was 3 times daily.  I discussed today that we will need more frequent visits and for him to start bringing his glucometer to visits in order to safely titrate his medications. He agreed to return later this month with his meter so we can adjust his doses.

## 2016-12-12 ENCOUNTER — Telehealth: Payer: Self-pay | Admitting: Internal Medicine

## 2016-12-12 NOTE — Telephone Encounter (Signed)
Called pt, got generic vmail, lm for rtc

## 2016-12-12 NOTE — Telephone Encounter (Signed)
Patient requesting diabetics strip sent to [facility] today

## 2016-12-18 ENCOUNTER — Ambulatory Visit: Payer: Self-pay

## 2016-12-19 ENCOUNTER — Telehealth: Payer: Self-pay | Admitting: Internal Medicine

## 2016-12-19 NOTE — Telephone Encounter (Signed)
NEEDS REFILL ON TESTING [place], TO [phone]

## 2016-12-19 NOTE — Telephone Encounter (Signed)
Pt can address this at his appt, they will not be filled at this time, he has been told this repeatedly

## 2016-12-21 ENCOUNTER — Encounter: Payer: Self-pay | Admitting: Internal Medicine

## 2016-12-21 ENCOUNTER — Ambulatory Visit: Payer: Self-pay

## 2016-12-22 ENCOUNTER — Ambulatory Visit: Payer: Medicare HMO | Admitting: Podiatry

## 2016-12-25 ENCOUNTER — Encounter: Payer: Self-pay | Admitting: Internal Medicine

## 2017-01-04 ENCOUNTER — Other Ambulatory Visit: Payer: Self-pay | Admitting: *Deleted

## 2017-01-04 NOTE — Telephone Encounter (Signed)
[facility] pharmacy called on behalf of patient.  Patient requesting test strips (short term supply) sent to [facility]. [facility] will be sending 90 day supply soon ( could not tell me specific dates but turnaround 7-10 days).  Thank you!

## 2017-01-05 ENCOUNTER — Other Ambulatory Visit: Payer: Self-pay | Admitting: *Deleted

## 2017-01-05 ENCOUNTER — Telehealth: Payer: Self-pay | Admitting: Internal Medicine

## 2017-01-05 DIAGNOSIS — E1142 Type 2 diabetes mellitus with diabetic polyneuropathy: Secondary | ICD-10-CM

## 2017-01-05 MED ORDER — DICLOFENAC SODIUM 1 % TD GEL: 2.0000 g | Freq: Three times a day (TID) | TRANSDERMAL | 5 refills | Status: DC

## 2017-01-05 MED ORDER — FOLIC ACID 1 MG PO TABS: 1.0000 mg | ORAL_TABLET | Freq: Every day | ORAL | 5 refills | Status: DC

## 2017-01-05 MED ORDER — ACCU-CHEK SOFTCLIX LANCETS MISC: 5 refills | Status: DC

## 2017-01-05 MED ORDER — GABAPENTIN 300 MG PO CAPS: ORAL_CAPSULE | ORAL | 5 refills | Status: DC

## 2017-01-05 MED ORDER — BD SWAB SINGLE USE REGULAR PADS: MEDICATED_PAD | 5 refills | Status: DC

## 2017-01-05 MED ORDER — PRAVASTATIN SODIUM 10 MG PO TABS: 10.0000 mg | ORAL_TABLET | Freq: Every day | ORAL | 5 refills | Status: DC

## 2017-01-05 MED ORDER — VITAMIN B-1 100 MG PO TABS: 100.0000 mg | ORAL_TABLET | Freq: Every day | ORAL | 5 refills | Status: DC

## 2017-01-05 MED ORDER — ARIPIPRAZOLE 5 MG PO TABS: 5.0000 mg | ORAL_TABLET | Freq: Every day | ORAL | 5 refills | Status: DC

## 2017-01-05 MED ORDER — LEVEMIR FLEXTOUCH 100 UNIT/ML ~~LOC~~ SOPN: 5.0000 [IU] | PEN_INJECTOR | SUBCUTANEOUS | 0 refills | Status: DC

## 2017-01-05 MED ORDER — PANCRELIPASE (LIP-PROT-AMYL) 36000-114000 UNITS PO CPEP: ORAL_CAPSULE | ORAL | 1 refills | Status: AC

## 2017-01-05 MED ORDER — HYDROXYZINE HCL 10 MG PO TABS: 10.0000 mg | ORAL_TABLET | Freq: Three times a day (TID) | ORAL | 5 refills | Status: DC | PRN

## 2017-01-05 MED ORDER — INSULIN ASPART 100 UNIT/ML FLEXPEN
PEN_INJECTOR | SUBCUTANEOUS | 5 refills | Status: DC
Start: 2017-01-05 — End: 2017-03-30

## 2017-01-05 MED ORDER — PANTOPRAZOLE SODIUM 40 MG PO TBEC: 40.0000 mg | DELAYED_RELEASE_TABLET | Freq: Every day | ORAL | 5 refills | Status: DC

## 2017-01-05 MED ORDER — IBUPROFEN 800 MG PO TABS: 800.0000 mg | ORAL_TABLET | Freq: Three times a day (TID) | ORAL | 0 refills | Status: DC | PRN

## 2017-01-05 NOTE — Telephone Encounter (Signed)
Called [facility], spoke to pharmacist, pt will only be using [facility] NO OTHER PHARMACY, have sent all his meds for approval

## 2017-01-05 NOTE — Telephone Encounter (Signed)
Refill Request Pt requesting all Medications be transferred over to [facility] LANCETS lancets    Alcohol Swabs (B-D SINGLE USE SWABS REGULAR) PADS    ARIPiprazole (ABILIFY) 5 MG tablet    Blood Glucose Calibration (ACCU-CHEK AVIVA) SOLN    Blood Glucose Monitoring Suppl (ACCU-CHEK AVIVA PLUS) w/Device KIT    diclofenac sodium (VOLTAREN) 1 % GEL    DULoxetine (CYMBALTA) 60 MG capsule    folic acid (FOLVITE) 1 MG tablet    gabapentin (NEURONTIN) 300 MG capsule    glucose blood (ACCU-CHEK AVIVA PLUS) test strip    hydrOXYzine (ATARAX/VISTARIL) 10 MG tablet    ibuprofen (ADVIL,MOTRIN) 800 MG tablet    insulin aspart (NOVOLOG) 100 UNIT/ML FlexPen    LEVEMIR FLEXTOUCH 100 UNIT/ML Pen    lipase/protease/amylase (CREON) 36000 UNITS CPEP capsule    Multiple Vitamin (MULTIVITAMIN WITH MINERALS) TABS tablet    omega-3 acid ethyl esters (LOVAZA) 1 g capsule    pantoprazole (PROTONIX) 40 MG tablet    pravastatin (PRAVACHOL) 10 MG tablet    thiamine (VITAMIN B-1) 100 MG tablet

## 2017-01-08 ENCOUNTER — Telehealth: Payer: Self-pay | Admitting: Internal Medicine

## 2017-01-08 NOTE — Telephone Encounter (Signed)
  Reason for call:   I placed an outgoing call to [name] at 8:05 PM regarding his Accu-Chek test strips.  Patient was asking for a new prescription to be [facility] a [facility] or [facility].   Assessment/ Plan:   This issue was addressed by his PCP Dr. Benjamine Mola today at 5 PM, according to his note patient received test strips via mail order on Saturday.  He sent a new prescription to [facility] where he was told that his insurance will not pay for anymore strips at this time and he has to pay dollars 100 per 50 strips out of his pocket.  When I repeat these information with him on phone he hang up, tried calling him again and he never picked up the phone.  As always, pt is advised that if symptoms worsen or new symptoms arise, they should go to an urgent care facility or to to ER for further evaluation.   Lorella Nimrod, MD   01/08/2017, 8:05 PM

## 2017-01-08 NOTE — Telephone Encounter (Signed)
Shanon Brow from [facility] refill. Wanted to a local refill outreach so patient can have something

## 2017-01-08 NOTE — Telephone Encounter (Signed)
Called [facility], they sent pt 300 test strips that was delivered sat by [place] postal service, pt stated today to them that he did not get them, [facility] has documentation that pt did get them, pt has also requested strips today from [facility] but pharmacist contacted insurance and [employer] and has refused to fill strips at this time unless pt pays $100.00 per 50 strips.

## 2017-01-09 NOTE — Telephone Encounter (Signed)
Michael [name] does not need additional test strip prescriptions at this time.  He has received far more strips than needed this year and we have confirmed receipt of these at local pharmacies and now per [name] as well. We made very sure he would have the appropriate amount available and I even wrote an additional prescription in November for his local pharmacy to avoid a theoretical lapse in supplies. He is already refused service at multiple local facilities due to this and was felt to most likely be fraudulent behavior.  He has also failed to bring a glucometer with data to review in any of his clinic encounters with me for the past year to even show the use of these supplies.

## 2017-01-31 ENCOUNTER — Telehealth: Payer: Self-pay | Admitting: Internal Medicine

## 2017-01-31 NOTE — Telephone Encounter (Signed)
   Reason for call:   I received a call from [name] at 9:45 PM indicating that he was out of test strips and requesting another prescription.   Pertinent Data:   Per Dr. Benjamine [name] note on 01/09/17, patient calls frequently requesting refills of his tests strips. The receipt of these test strips have been confirmed with his pharmacy. He has received far too many refills than needed in a year. He has been refused service at multiple local facilities due to this behavior which was felt to be fraudulent in nature.   Patient received dismissal letter from our clinic on 12/25/16  Informed patient that [facility] is no longer responsible for his medical care given his dismissal from our clinic over 30 days ago.    Assessment / Plan / Recommendations:   Instructed patient to follow up with his new PCP for supplies.   As always, pt is advised that if symptoms worsen or new symptoms arise, they should go to an urgent care facility or to to ER for further evaluation.   Velna Ochs, MD   01/31/2017, 8:39 PM

## 2017-02-01 DIAGNOSIS — M1712 Unilateral primary osteoarthritis, left knee: Secondary | ICD-10-CM | POA: Diagnosis not present

## 2017-02-01 DIAGNOSIS — M1711 Unilateral primary osteoarthritis, right knee: Secondary | ICD-10-CM | POA: Diagnosis not present

## 2017-02-05 ENCOUNTER — Telehealth: Payer: Self-pay | Admitting: Internal Medicine

## 2017-02-05 NOTE — Telephone Encounter (Signed)
   Reason for call:   I received a call from Mr. [name] at 8:30 PM indicating that he needed refills.   Pertinent Data:   Informed patient that he was dismissed from our clinic over a month ago and that we are no longer responsible for providing him with prescriptions  Patient was unaware, said he would contact clinic tomorrow to find out why   Assessment / Plan / Recommendations:   Encouraged patient to establish with a new PCP  As always, pt is advised that if symptoms worsen or new symptoms arise, they should go to an urgent care facility or to to ER for further evaluation.   Velna Ochs, MD   02/05/2017, 8:34 PM

## 2017-02-06 ENCOUNTER — Other Ambulatory Visit: Payer: Self-pay | Admitting: Internal Medicine

## 2017-02-09 DIAGNOSIS — Z794 Long term (current) use of insulin: Secondary | ICD-10-CM | POA: Diagnosis not present

## 2017-02-09 DIAGNOSIS — E1169 Type 2 diabetes mellitus with other specified complication: Secondary | ICD-10-CM | POA: Diagnosis not present

## 2017-02-09 DIAGNOSIS — K860 Alcohol-induced chronic pancreatitis: Secondary | ICD-10-CM | POA: Diagnosis not present

## 2017-02-09 DIAGNOSIS — F319 Bipolar disorder, unspecified: Secondary | ICD-10-CM | POA: Diagnosis not present

## 2017-02-09 DIAGNOSIS — R197 Diarrhea, unspecified: Secondary | ICD-10-CM | POA: Diagnosis not present

## 2017-02-09 DIAGNOSIS — E785 Hyperlipidemia, unspecified: Secondary | ICD-10-CM | POA: Diagnosis not present

## 2017-02-09 DIAGNOSIS — I739 Peripheral vascular disease, unspecified: Secondary | ICD-10-CM | POA: Diagnosis not present

## 2017-02-09 DIAGNOSIS — E1140 Type 2 diabetes mellitus with diabetic neuropathy, unspecified: Secondary | ICD-10-CM | POA: Diagnosis not present

## 2017-02-09 DIAGNOSIS — E1159 Type 2 diabetes mellitus with other circulatory complications: Secondary | ICD-10-CM | POA: Diagnosis not present

## 2017-02-09 DIAGNOSIS — I10 Essential (primary) hypertension: Secondary | ICD-10-CM | POA: Diagnosis not present

## 2017-02-13 DIAGNOSIS — H2513 Age-related nuclear cataract, bilateral: Secondary | ICD-10-CM | POA: Diagnosis not present

## 2017-02-13 DIAGNOSIS — E119 Type 2 diabetes mellitus without complications: Secondary | ICD-10-CM | POA: Diagnosis not present

## 2017-02-13 DIAGNOSIS — H40013 Open angle with borderline findings, low risk, bilateral: Secondary | ICD-10-CM | POA: Diagnosis not present

## 2017-03-01 ENCOUNTER — Other Ambulatory Visit: Payer: Self-pay | Admitting: Internal Medicine

## 2017-03-09 ENCOUNTER — Other Ambulatory Visit: Payer: Self-pay | Admitting: Internal Medicine

## 2017-03-12 ENCOUNTER — Other Ambulatory Visit: Payer: Self-pay | Admitting: Internal Medicine

## 2017-03-17 ENCOUNTER — Other Ambulatory Visit: Payer: Self-pay | Admitting: Internal Medicine

## 2017-03-26 ENCOUNTER — Emergency Department (HOSPITAL_COMMUNITY): Payer: 59

## 2017-03-26 ENCOUNTER — Encounter (HOSPITAL_COMMUNITY): Payer: Self-pay

## 2017-03-26 ENCOUNTER — Observation Stay (HOSPITAL_COMMUNITY): Payer: 59

## 2017-03-26 ENCOUNTER — Other Ambulatory Visit: Payer: Self-pay

## 2017-03-26 ENCOUNTER — Inpatient Hospital Stay (HOSPITAL_COMMUNITY)
Admission: EM | Admit: 2017-03-26 | Discharge: 2017-03-30 | DRG: 637 | Disposition: A | Discharge status: Home Health | Payer: 59 | Attending: Family Medicine | Admitting: Family Medicine

## 2017-03-26 DIAGNOSIS — Z8249 Family history of ischemic heart disease and other diseases of the circulatory system: Secondary | ICD-10-CM

## 2017-03-26 DIAGNOSIS — K219 Gastro-esophageal reflux disease without esophagitis: Secondary | ICD-10-CM | POA: Diagnosis present

## 2017-03-26 DIAGNOSIS — F419 Anxiety disorder, unspecified: Secondary | ICD-10-CM | POA: Diagnosis present

## 2017-03-26 DIAGNOSIS — E512 Wernicke's encephalopathy: Secondary | ICD-10-CM | POA: Diagnosis not present

## 2017-03-26 DIAGNOSIS — E440 Moderate protein-calorie malnutrition: Secondary | ICD-10-CM | POA: Diagnosis present

## 2017-03-26 DIAGNOSIS — E872 Acidosis: Secondary | ICD-10-CM | POA: Diagnosis present

## 2017-03-26 DIAGNOSIS — R4701 Aphasia: Secondary | ICD-10-CM | POA: Diagnosis present

## 2017-03-26 DIAGNOSIS — Z794 Long term (current) use of insulin: Secondary | ICD-10-CM

## 2017-03-26 DIAGNOSIS — E1142 Type 2 diabetes mellitus with diabetic polyneuropathy: Secondary | ICD-10-CM | POA: Diagnosis present

## 2017-03-26 DIAGNOSIS — T383X5A Adverse effect of insulin and oral hypoglycemic [antidiabetic] drugs, initial encounter: Secondary | ICD-10-CM | POA: Diagnosis present

## 2017-03-26 DIAGNOSIS — E11649 Type 2 diabetes mellitus with hypoglycemia without coma: Secondary | ICD-10-CM | POA: Diagnosis not present

## 2017-03-26 DIAGNOSIS — R4182 Altered mental status, unspecified: Secondary | ICD-10-CM

## 2017-03-26 DIAGNOSIS — I10 Essential (primary) hypertension: Secondary | ICD-10-CM | POA: Diagnosis present

## 2017-03-26 DIAGNOSIS — E1151 Type 2 diabetes mellitus with diabetic peripheral angiopathy without gangrene: Secondary | ICD-10-CM | POA: Diagnosis present

## 2017-03-26 DIAGNOSIS — Z6823 Body mass index (BMI) 23.0-23.9, adult: Secondary | ICD-10-CM

## 2017-03-26 DIAGNOSIS — G9341 Metabolic encephalopathy: Secondary | ICD-10-CM | POA: Diagnosis present

## 2017-03-26 DIAGNOSIS — F319 Bipolar disorder, unspecified: Secondary | ICD-10-CM | POA: Diagnosis present

## 2017-03-26 DIAGNOSIS — F1020 Alcohol dependence, uncomplicated: Secondary | ICD-10-CM | POA: Diagnosis present

## 2017-03-26 DIAGNOSIS — F1290 Cannabis use, unspecified, uncomplicated: Secondary | ICD-10-CM | POA: Diagnosis present

## 2017-03-26 DIAGNOSIS — E160 Drug-induced hypoglycemia without coma: Secondary | ICD-10-CM | POA: Diagnosis present

## 2017-03-26 DIAGNOSIS — Z79899 Other long term (current) drug therapy: Secondary | ICD-10-CM

## 2017-03-26 DIAGNOSIS — Z833 Family history of diabetes mellitus: Secondary | ICD-10-CM

## 2017-03-26 DIAGNOSIS — F209 Schizophrenia, unspecified: Secondary | ICD-10-CM | POA: Diagnosis present

## 2017-03-26 DIAGNOSIS — K860 Alcohol-induced chronic pancreatitis: Secondary | ICD-10-CM | POA: Diagnosis present

## 2017-03-26 DIAGNOSIS — R740 Nonspecific elevation of levels of transaminase and lactic acid dehydrogenase [LDH]: Secondary | ICD-10-CM | POA: Diagnosis present

## 2017-03-26 DIAGNOSIS — E785 Hyperlipidemia, unspecified: Secondary | ICD-10-CM | POA: Diagnosis present

## 2017-03-26 DIAGNOSIS — F17210 Nicotine dependence, cigarettes, uncomplicated: Secondary | ICD-10-CM | POA: Diagnosis present

## 2017-03-26 DIAGNOSIS — E162 Hypoglycemia, unspecified: Secondary | ICD-10-CM

## 2017-03-26 DIAGNOSIS — F310 Bipolar disorder, current episode hypomanic: Secondary | ICD-10-CM

## 2017-03-26 DIAGNOSIS — Z888 Allergy status to other drugs, medicaments and biological substances status: Secondary | ICD-10-CM

## 2017-03-26 DIAGNOSIS — Z886 Allergy status to analgesic agent status: Secondary | ICD-10-CM

## 2017-03-26 DIAGNOSIS — Z9181 History of falling: Secondary | ICD-10-CM

## 2017-03-26 LAB — I-STAT TROPONIN, ED: TROPONIN I, POC: 0.01 ng/mL (ref 0.00–0.08)

## 2017-03-26 LAB — CBG MONITORING, ED
GLUCOSE-CAPILLARY: 161 mg/dL — AB (ref 65–99)
GLUCOSE-CAPILLARY: 41 mg/dL — AB (ref 65–99)
GLUCOSE-CAPILLARY: 34 mg/dL — AB (ref 65–99)
GLUCOSE-CAPILLARY: 146 mg/dL — AB (ref 65–99)
GLUCOSE-CAPILLARY: 129 mg/dL — AB (ref 65–99)
GLUCOSE-CAPILLARY: 135 mg/dL — AB (ref 65–99)
Glucose-Capillary: 34 mg/dL — CL (ref 65–99)
Glucose-Capillary: 103 mg/dL — ABNORMAL HIGH (ref 65–99)
Glucose-Capillary: 84 mg/dL (ref 65–99)
Glucose-Capillary: 177 mg/dL — ABNORMAL HIGH (ref 65–99)

## 2017-03-26 LAB — I-STAT CHEM 8, ED
BUN: 16 mg/dL (ref 6–20)
CREATININE: 0.9 mg/dL (ref 0.61–1.24)
Calcium, Ion: 1 mmol/L — ABNORMAL LOW (ref 1.15–1.40)
Chloride: 104 mmol/L (ref 101–111)
Glucose, Bld: 56 mg/dL — ABNORMAL LOW (ref 65–99)
HEMATOCRIT: 49 % (ref 39.0–52.0)
HEMOGLOBIN: 16.7 g/dL (ref 13.0–17.0)
POTASSIUM: 3.8 mmol/L (ref 3.5–5.1)
SODIUM: 145 mmol/L (ref 135–145)
TCO2: 28 mmol/L (ref 22–32)

## 2017-03-26 LAB — COMPREHENSIVE METABOLIC PANEL
ALBUMIN: 4 g/dL (ref 3.5–5.0)
ALT: 148 U/L — ABNORMAL HIGH (ref 17–63)
ANION GAP: 13 (ref 5–15)
AST: 177 U/L — AB (ref 15–41)
Alkaline Phosphatase: 309 U/L — ABNORMAL HIGH (ref 38–126)
BILIRUBIN TOTAL: 0.8 mg/dL (ref 0.3–1.2)
BUN: 15 mg/dL (ref 6–20)
CHLORIDE: 106 mmol/L (ref 101–111)
CO2: 26 mmol/L (ref 22–32)
Calcium: 8 mg/dL — ABNORMAL LOW (ref 8.9–10.3)
Creatinine, Ser: 0.68 mg/dL (ref 0.61–1.24)
GFR calc Af Amer: >60 mL/min (ref >60)
GFR calc non Af Amer: >60 mL/min (ref >60)
GLUCOSE: 62 mg/dL — AB (ref 65–99)
POTASSIUM: 3.8 mmol/L (ref 3.5–5.1)
SODIUM: 145 mmol/L (ref 135–145)
TOTAL PROTEIN: 6.9 g/dL (ref 6.5–8.1)

## 2017-03-26 LAB — LACTIC ACID, PLASMA
LACTIC ACID, VENOUS: 3.3 mmol/L — AB (ref 0.5–1.9)
LACTIC ACID, VENOUS: 1.2 mmol/L (ref 0.5–1.9)

## 2017-03-26 LAB — URINALYSIS, ROUTINE W REFLEX MICROSCOPIC
BILIRUBIN URINE: NEGATIVE
Bacteria, UA: NONE SEEN
Glucose, UA: >=500 mg/dL — AB
Ketones, ur: NEGATIVE mg/dL
LEUKOCYTES UA: NEGATIVE
Nitrite: NEGATIVE
Protein, ur: NEGATIVE mg/dL
Specific Gravity, Urine: 1.042 — ABNORMAL HIGH (ref 1.005–1.030)
pH: 5 (ref 5.0–8.0)

## 2017-03-26 LAB — CBC WITH DIFFERENTIAL/PLATELET
BASOS ABS: 0 10*3/uL (ref 0.0–0.1)
BASOS PCT: 0 %
EOS ABS: 0 10*3/uL (ref 0.0–0.7)
EOS PCT: 0 %
HEMATOCRIT: 45.7 % (ref 39.0–52.0)
Hemoglobin: 16.1 g/dL (ref 13.0–17.0)
Lymphocytes Relative: 13 %
Lymphs Abs: 1.1 10*3/uL (ref 0.7–4.0)
MCH: 34 pg (ref 26.0–34.0)
MCHC: 35.2 g/dL (ref 30.0–36.0)
MCV: 96.4 fL (ref 78.0–100.0)
MONO ABS: 0.4 10*3/uL (ref 0.1–1.0)
MONOS PCT: 5 %
NEUTROS ABS: 7.4 10*3/uL (ref 1.7–7.7)
Neutrophils Relative %: 82 %
PLATELETS: 233 10*3/uL (ref 150–400)
RBC: 4.74 MIL/uL (ref 4.22–5.81)
RDW: 14.4 % (ref 11.5–15.5)
WBC: 8.9 10*3/uL (ref 4.0–10.5)

## 2017-03-26 LAB — GLUCOSE, CAPILLARY: Glucose-Capillary: 193 mg/dL — ABNORMAL HIGH (ref 65–99)

## 2017-03-26 LAB — I-STAT CG4 LACTIC ACID, ED
LACTIC ACID, VENOUS: 3.44 mmol/L — AB (ref 0.5–1.9)
LACTIC ACID, VENOUS: 3.53 mmol/L — AB (ref 0.5–1.9)

## 2017-03-26 MED ORDER — DEXTROSE 50 % IV SOLN
INTRAVENOUS | Status: AC
  Filled 2017-03-26: qty 50

## 2017-03-26 MED ORDER — DEXTROSE 50 % IV SOLN
INTRAVENOUS | Status: AC
  Administered 2017-03-26: 50 mL via INTRAVENOUS
  Filled 2017-03-26: qty 50

## 2017-03-26 MED ORDER — SODIUM CHLORIDE 0.9 % IV BOLUS (SEPSIS)
1000.0000 mL | Freq: Once | INTRAVENOUS | Status: AC
  Administered 2017-03-26: 1000 mL via INTRAVENOUS

## 2017-03-26 MED ORDER — DULOXETINE HCL 60 MG PO CPEP
60.0000 mg | ORAL_CAPSULE | Freq: Every day | ORAL | Status: DC
  Administered 2017-03-26 – 2017-03-30 (×5): 60 mg via ORAL
  Filled 2017-03-26 (×4): qty 1
  Filled 2017-03-26: qty 2
  Filled 2017-03-26: qty 1

## 2017-03-26 MED ORDER — ADULT MULTIVITAMIN W/MINERALS CH
1.0000 | ORAL_TABLET | Freq: Every day | ORAL | Status: DC
  Administered 2017-03-26 – 2017-03-30 (×5): 1 via ORAL
  Filled 2017-03-26 (×5): qty 1

## 2017-03-26 MED ORDER — GABAPENTIN 400 MG PO CAPS
1200.0000 mg | ORAL_CAPSULE | Freq: Every day | ORAL | Status: DC
  Administered 2017-03-27 – 2017-03-29 (×3): 1200 mg via ORAL
  Filled 2017-03-26 (×3): qty 3

## 2017-03-26 MED ORDER — VITAMIN B-1 100 MG PO TABS
100.0000 mg | ORAL_TABLET | Freq: Every day | ORAL | Status: DC
  Administered 2017-03-26 – 2017-03-30 (×5): 100 mg via ORAL
  Filled 2017-03-26 (×5): qty 1

## 2017-03-26 MED ORDER — SODIUM CHLORIDE 0.9 % IJ SOLN
INTRAMUSCULAR | Status: AC
  Filled 2017-03-26: qty 50

## 2017-03-26 MED ORDER — GLUCOSE 40 % PO GEL
1.0000 | ORAL | Status: DC | PRN
  Filled 2017-03-26: qty 1

## 2017-03-26 MED ORDER — DEXTROSE 50 % IV SOLN
INTRAVENOUS | Status: AC
  Administered 2017-03-26: 50 mL
  Filled 2017-03-26: qty 50

## 2017-03-26 MED ORDER — DEXTROSE 50 % IV SOLN
50.0000 mL | Freq: Once | INTRAVENOUS | Status: AC
  Administered 2017-03-26: 50 mL via INTRAVENOUS

## 2017-03-26 MED ORDER — DEXTROSE-NACL 5-0.45 % IV SOLN
INTRAVENOUS | Status: DC
  Administered 2017-03-26 – 2017-03-27 (×3): via INTRAVENOUS

## 2017-03-26 MED ORDER — DEXTROSE 50 % IV SOLN
1.0000 | Freq: Once | INTRAVENOUS | Status: AC
  Administered 2017-03-26: 50 mL via INTRAVENOUS

## 2017-03-26 MED ORDER — GABAPENTIN 300 MG PO CAPS
900.0000 mg | ORAL_CAPSULE | Freq: Two times a day (BID) | ORAL | Status: DC
  Administered 2017-03-26 – 2017-03-30 (×8): 900 mg via ORAL
  Filled 2017-03-26 (×9): qty 3

## 2017-03-26 MED ORDER — ARIPIPRAZOLE 5 MG PO TABS
5.0000 mg | ORAL_TABLET | Freq: Every day | ORAL | Status: DC
Start: 2017-03-26 — End: 2017-03-30
  Administered 2017-03-26 – 2017-03-30 (×5): 5 mg via ORAL
  Filled 2017-03-26 (×5): qty 1

## 2017-03-26 MED ORDER — IOPAMIDOL (ISOVUE-370) INJECTION 76%
INTRAVENOUS | Status: AC
  Administered 2017-03-26: 100 mL
  Filled 2017-03-26: qty 100

## 2017-03-26 MED ORDER — PANTOPRAZOLE SODIUM 40 MG PO TBEC
40.0000 mg | DELAYED_RELEASE_TABLET | Freq: Every day | ORAL | Status: DC
  Administered 2017-03-26 – 2017-03-30 (×5): 40 mg via ORAL
  Filled 2017-03-26 (×5): qty 1

## 2017-03-26 MED ORDER — FOLIC ACID 1 MG PO TABS
1.0000 mg | ORAL_TABLET | Freq: Every day | ORAL | Status: DC
  Administered 2017-03-26 – 2017-03-30 (×5): 1 mg via ORAL
  Filled 2017-03-26 (×5): qty 1

## 2017-03-26 NOTE — ED Notes (Signed)
1x attempt to call report to ICU, RN requesting to call department back to take report

## 2017-03-26 NOTE — ED Notes (Signed)
Margot Ables., RN notified of lactic acid 3.3 via radio and confirmation noted.

## 2017-03-26 NOTE — ED Notes (Signed)
Hospitalist at bedside 

## 2017-03-26 NOTE — ED Notes (Signed)
Michael [name] [phone] Michael [name] [phone]

## 2017-03-26 NOTE — ED Notes (Signed)
Bed: TX77 Expected date:  Expected time:  Means of arrival:  Comments: EMS- Hypoglycemia

## 2017-03-26 NOTE — ED Triage Notes (Addendum)
Patient with history of diabetes, was found on the floor at home by family. Patient was awake and alert last night, per family. Patient self-administered Insulin last night, and then was found by family this morning. Upon arrival, EMS found patient CBG = 15. Glucagon administered. Patient somulant in triage. CBG= 34. EDMD Schlossman at patient bedside.

## 2017-03-26 NOTE — ED Notes (Addendum)
Pt currently in CT.

## 2017-03-26 NOTE — ED Notes (Signed)
ED TO INPATIENT HANDOFF REPORT  Name/Age/Gender Michael [name] 62 y.o. male  Code Status    Code Status Orders  (From admission, onward)        Start     Ordered   03/26/17 1517  Full code  Continuous     03/26/17 1518    Code Status History    Date Active Date Inactive Code Status Order ID Comments User Context   09/06/2016 03:38 09/06/2016 14:49 Full Code [id number]  [employer] ED   06/18/2016 19:46 06/22/2016 18:49 Full Code [id number]  Norman Herrlich, MD ED   06/09/2016 09:29 06/13/2016 22:35 Full Code [id number]  Juliet Rude, MD Inpatient   06/05/2016 12:07 06/08/2016 18:12 Full Code [id number]  Collier Salina, MD Inpatient   06/05/2016 07:20 06/05/2016 11:36 Full Code [id number]  Riccardo Dubin, MD ED   05/31/2016 16:17 06/05/2016 05:01 Full Code [id number]  Kerrie Buffalo, NP Inpatient   05/31/2016 05:47 05/31/2016 16:14 Full Code [id number]  Abigail Butts, PA-C ED   09/02/2015 21:49 09/03/2015 21:12 Full Code [id number]  Roney Jaffe, MD Inpatient   12/03/2014 22:43 12/10/2014 15:26 Full Code [id number]  Merton Border, MD Inpatient   11/17/2014 17:13 11/19/2014 16:13 Full Code [id number]  Jones Bales, MD Inpatient   10/09/2014 02:04 10/15/2014 18:51 Full Code [id number]  Vickii Chafe, MD Inpatient   09/08/2014 19:03 09/09/2014 17:16 Full Code [id number]  Lucious Groves, DO Inpatient      Home/SNF/Other Home  Chief Complaint hypoglycemia  Level of Care/Admitting Diagnosis ED Disposition    ED Disposition Condition [facility] Area: [facility] [[id number]]  Level of Care: Stepdown [14]  Admit to SDU based on following criteria: Severe physiological/psychological symptoms:  Any diagnosis requiring assessment & intervention at least every 4 hours on an ongoing basis to obtain desired patient outcomes including stability and rehabilitation  Diagnosis: Hypoglycemia [675916]  Admitting Physician: Phillips Grout [[id number]]   Attending Physician: Derrill Kay A [[id number]]  PT Class (Do Not Modify): Observation [104]  PT Acc Code (Do Not Modify): Observation [10022]       Medical History Past Medical History:  Diagnosis Date  . Anxiety   . Arthritis    "joints; shoulders; feet" (06/08/2016)  . Bipolar disorder ([place])   . Daily headache    "7:30 - 8:00 q night" (06/08/2016)  . Diabetic peripheral neuropathy ([place])   . GERD (gastroesophageal reflux disease)   . Pancreatitis   . Schizophrenia ([place])   . Seizures ([place]) 01/2016 X 2  . Type II diabetes mellitus (H[facility])     Allergies Allergies  Allergen Reactions  . Ace Inhibitors Swelling and Other (See Comments)    Angioedema - unquestionable  . Losartan Swelling and Other (See Comments)    Pt presented with unquestionable angioedema on ACEIs (Angiotensin-converting enzyme (ACE) inhibitors) so aviod ARBs (Angiotensin receptor blockers), as well  . Tylenol [Acetaminophen] Other (See Comments)    "cannot have this" (contraindicated)    IV Location/Drains/Wounds Patient Lines/Drains/Airways Status   Active Line/Drains/Airways    Name:   Placement date:   Placement time:   Site:   Days:   Peripheral IV 03/26/17 Left;Upper Arm   03/26/17    1542    Arm   less than 1          Labs/Imaging Results for orders placed or performed during the hospital encounter of 03/26/17 (from the past 48 hour(s))  CBC with Differential  Status: None   Collection Time: 03/26/17  9:39 AM  Result Value Ref Range   WBC 8.9 4.0 - 10.5 K/uL   RBC 4.74 4.22 - 5.81 MIL/uL   Hemoglobin 16.1 13.0 - 17.0 g/dL   HCT 45.7 39.0 - 52.0 %   MCV 96.4 78.0 - 100.0 fL   MCH 34.0 26.0 - 34.0 pg   MCHC 35.2 30.0 - 36.0 g/dL   RDW 14.4 11.5 - 15.5 %   Platelets 233 150 - 400 K/uL   Neutrophils Relative % 82 %   Neutro Abs 7.4 1.7 - 7.7 K/uL   Lymphocytes Relative 13 %   Lymphs Abs 1.1 0.7 - 4.0 K/uL   Monocytes Relative 5 %   Monocytes Absolute 0.4 0.1 - 1.0 K/uL   Eosinophils  Relative 0 %   Eosinophils Absolute 0.0 0.0 - 0.7 K/uL   Basophils Relative 0 %   Basophils Absolute 0.0 0.0 - 0.1 K/uL    Comment: Performed at [facility], [place] [address] [place], [place] [address]  Comprehensive metabolic panel     Status: Abnormal   Collection Time: 03/26/17  9:39 AM  Result Value Ref Range   Sodium 145 135 - 145 mmol/L   Potassium 3.8 3.5 - 5.1 mmol/L   Chloride 106 101 - 111 mmol/L   CO2 26 22 - 32 mmol/L   Glucose, Bld 62 (L) 65 - 99 mg/dL   BUN 15 6 - 20 mg/dL   Creatinine, Ser 0.68 0.61 - 1.24 mg/dL   Calcium 8.0 (L) 8.9 - 10.3 mg/dL   Total Protein 6.9 6.5 - 8.1 g/dL   Albumin 4.0 3.5 - 5.0 g/dL   AST 177 (H) 15 - 41 U/L   ALT 148 (H) 17 - 63 U/L   Alkaline Phosphatase 309 (H) 38 - 126 U/L   Total Bilirubin 0.8 0.3 - 1.2 mg/dL   GFR calc non Af Amer >60 >60 mL/min   GFR calc Af Amer >60 >60 mL/min    Comment: (NOTE) The eGFR has been calculated using the CKD EPI equation. This calculation has not been validated in all clinical situations. eGFR's persistently <60 mL/min signify possible Chronic Kidney Disease.    Anion gap 13 5 - 15    Comment: Performed at [facility], [place] [address] [place], [place] [address]  CBG monitoring, ED     Status: Abnormal   Collection Time: 03/26/17  9:42 AM  Result Value Ref Range   Glucose-Capillary 34 (LL) 65 - 99 mg/dL  I-Stat Troponin, ED (not at [facility])     Status: None   Collection Time: 03/26/17  9:56 AM  Result Value Ref Range   Troponin i, poc 0.01 0.00 - 0.08 ng/mL   Comment 3            Comment: Due to the release kinetics of cTnI, a negative result within the first hours of the onset of symptoms does not rule out myocardial infarction with certainty. If myocardial infarction is still suspected, repeat the test at appropriate intervals.   I-Stat Chem 8, ED     Status: Abnormal   Collection Time: 03/26/17  9:58 AM  Result Value Ref Range   Sodium 145 135 - 145  mmol/L   Potassium 3.8 3.5 - 5.1 mmol/L   Chloride 104 101 - 111 mmol/L   BUN 16 6 - 20 mg/dL   Creatinine, Ser 0.90 0.61 - 1.24 mg/dL   Glucose, Bld 56 (  L) 65 - 99 mg/dL   Calcium, Ion 1.00 (L) 1.15 - 1.40 mmol/L   TCO2 28 22 - 32 mmol/L   Hemoglobin 16.7 13.0 - 17.0 g/dL   HCT 49.0 39.0 - 52.0 %  I-Stat CG4 Lactic Acid, ED     Status: Abnormal   Collection Time: 03/26/17  9:58 AM  Result Value Ref Range   Lactic Acid, Venous 3.44 (HH) 0.5 - 1.9 mmol/L   Comment NOTIFIED PHYSICIAN   CBG monitoring, ED     Status: Abnormal   Collection Time: 03/26/17 10:02 AM  Result Value Ref Range   Glucose-Capillary 161 (H) 65 - 99 mg/dL  CBG monitoring, ED     Status: Abnormal   Collection Time: 03/26/17 10:34 AM  Result Value Ref Range   Glucose-Capillary 103 (H) 65 - 99 mg/dL   Comment 1 Notify RN    Comment 2 Document in Chart   CBG monitoring, ED     Status: Abnormal   Collection Time: 03/26/17 11:30 AM  Result Value Ref Range   Glucose-Capillary 41 (LL) 65 - 99 mg/dL  CBG monitoring, ED     Status: None   Collection Time: 03/26/17 12:18 PM  Result Value Ref Range   Glucose-Capillary 84 65 - 99 mg/dL  Urinalysis, Routine w reflex microscopic     Status: Abnormal   Collection Time: 03/26/17  1:11 PM  Result Value Ref Range   Color, Urine AMBER (A) YELLOW    Comment: BIOCHEMICALS MAY BE AFFECTED BY COLOR   APPearance CLEAR CLEAR   Specific Gravity, Urine 1.042 (H) 1.005 - 1.030   pH 5.0 5.0 - 8.0   Glucose, UA >=500 (A) NEGATIVE mg/dL   Hgb urine dipstick MODERATE (A) NEGATIVE   Bilirubin Urine NEGATIVE NEGATIVE   Ketones, ur NEGATIVE NEGATIVE mg/dL   Protein, ur NEGATIVE NEGATIVE mg/dL   Nitrite NEGATIVE NEGATIVE   Leukocytes, UA NEGATIVE NEGATIVE   RBC / HPF 6-30 0 - 5 RBC/hpf   WBC, UA 0-5 0 - 5 WBC/hpf   Bacteria, UA NONE SEEN NONE SEEN   Squamous Epithelial / LPF 0-5 (A) NONE SEEN   Mucus PRESENT    Hyaline Casts, UA PRESENT     Comment: Performed at [facility], [place] [address] [place], [place] [address]  I-Stat CG4 Lactic Acid, ED     Status: Abnormal   Collection Time: 03/26/17  2:47 PM  Result Value Ref Range   Lactic Acid, Venous 3.53 (HH) 0.5 - 1.9 mmol/L   Comment NOTIFIED PHYSICIAN   CBG monitoring, ED     Status: Abnormal   Collection Time: 03/26/17  3:22 PM  Result Value Ref Range   Glucose-Capillary 34 (LL) 65 - 99 mg/dL  Lactic acid, plasma     Status: Abnormal   Collection Time: 03/26/17  3:29 PM  Result Value Ref Range   Lactic Acid, Venous 3.3 (HH) 0.5 - 1.9 mmol/L    Comment: CRITICAL RESULT CALLED TO, READ BACK BY AND VERIFIED WITH: S.BINGHAM RN AT 1622 ON 03/26/17 BY S.VANHOORNE Performed at [facility], [place] [address] [place], [place] [address]   CBG monitoring, ED     Status: Abnormal   Collection Time: 03/26/17  4:04 PM  Result Value Ref Range   Glucose-Capillary 177 (H) 65 - 99 mg/dL  CBG monitoring, ED     Status: Abnormal   Collection Time: 03/26/17  6:15 PM  Result Value Ref Range   Glucose-Capillary 146 (  H) 65 - 99 mg/dL  CBG monitoring, ED     Status: Abnormal   Collection Time: 03/26/17  7:06 PM  Result Value Ref Range   Glucose-Capillary 129 (H) 65 - 99 mg/dL   Ct Angio Head W Or Wo Contrast  Result Date: 03/26/2017 CLINICAL DATA:  Stuttering. Stroke follow-up. History of diabetes with CBG of 15. EXAM: CT ANGIOGRAPHY HEAD AND NECK TECHNIQUE: Multidetector CT imaging of the head and neck was performed using the standard protocol during bolus administration of intravenous contrast. Multiplanar CT image reconstructions and MIPs were obtained to evaluate the vascular anatomy. Carotid stenosis measurements (when applicable) are obtained utilizing NASCET criteria, using the distal internal carotid diameter as the denominator. CONTRAST:  153m ISOVUE-370 IOPAMIDOL (ISOVUE-370) INJECTION 76% COMPARISON:  Brain MRI 06/12/2016 FINDINGS: CT HEAD FINDINGS Brain: Moderate to advanced  low-density in the cerebral white matter compatible with chronic small vessel ischemia. No acute hemorrhage, hydrocephalus, or masslike finding. Vascular: See below Skull: Negative Sinuses: Negative Orbits: Negative Review of the MIP images confirms the above findings CTA NECK FINDINGS Aortic arch: Mild atherosclerotic plaque. Left vertebral artery arises from the arch. Right carotid system: Atherosclerotic plaque along the common carotid artery. No flow limiting stenosis, ulceration, or beading. Left carotid system: Mild atherosclerotic plaque at the common carotid bifurcation. No flow limiting stenosis, ulceration, or beading. Vertebral arteries: Left dominant vertebral artery. Both vessels are smooth and diffusely patent to the dura. No proximal subclavian atherosclerosis. Skeleton: Degenerative changes without acute or aggressive finding. Other neck: Small area of stranding and subcutaneous gas in the right low posterior triangle, question attempted vascular access. Upper chest: Paraseptal emphysema, mild. Review of the MIP images confirms the above findings CTA HEAD FINDINGS Anterior circulation: Atherosclerotic plaque on both carotid siphons. No flow limiting stenosis no major branch occlusion. Negative for aneurysm. Posterior circulation: Strong left vertebral artery dominance. The vertebral and basilar arteries are smooth and diffusely patent. Symmetric flow in bilateral PCAs. Hypoplastic left more than right P1 segments. Venous sinuses: Negative Anatomic variants: As above Delayed phase: No abnormal intracranial enhancement Review of the MIP images confirms the above findings IMPRESSION: 1. No acute finding. 2. Atherosclerosis without flow limiting stenosis or ulceration in the head or neck. 3. Extensive chronic small vessel ischemia in the cerebral white matter. 4. Small area of stranding and gas at the right neck base, question attempted vascular access. Electronically Signed   By: JMonte FantasiaM.D.    On: 03/26/2017 11:46   Dg Chest 2 View  Result Date: 03/26/2017 CLINICAL DATA:  Hypoglycemia. EXAM: CHEST  2 VIEW COMPARISON:  06/08/2016 FINDINGS: Cardiac silhouette is normal in size. No mediastinal or hilar masses. No evidence of adenopathy. Clear lungs.  No pleural effusion or pneumothorax. Skeletal structures are intact. IMPRESSION: No active cardiopulmonary disease. Electronically Signed   By: DLajean ManesM.D.   On: 03/26/2017 13:41   Ct Head Wo Contrast  Result Date: 03/26/2017 CLINICAL DATA:  Stuttering. Stroke follow-up. History of diabetes with CBG of 15. EXAM: CT ANGIOGRAPHY HEAD AND NECK TECHNIQUE: Multidetector CT imaging of the head and neck was performed using the standard protocol during bolus administration of intravenous contrast. Multiplanar CT image reconstructions and MIPs were obtained to evaluate the vascular anatomy. Carotid stenosis measurements (when applicable) are obtained utilizing NASCET criteria, using the distal internal carotid diameter as the denominator. CONTRAST:  1071mISOVUE-370 IOPAMIDOL (ISOVUE-370) INJECTION 76% COMPARISON:  Brain MRI 06/12/2016 FINDINGS: CT HEAD FINDINGS Brain: Moderate to advanced low-density in the  cerebral white matter compatible with chronic small vessel ischemia. No acute hemorrhage, hydrocephalus, or masslike finding. Vascular: See below Skull: Negative Sinuses: Negative Orbits: Negative Review of the MIP images confirms the above findings CTA NECK FINDINGS Aortic arch: Mild atherosclerotic plaque. Left vertebral artery arises from the arch. Right carotid system: Atherosclerotic plaque along the common carotid artery. No flow limiting stenosis, ulceration, or beading. Left carotid system: Mild atherosclerotic plaque at the common carotid bifurcation. No flow limiting stenosis, ulceration, or beading. Vertebral arteries: Left dominant vertebral artery. Both vessels are smooth and diffusely patent to the dura. No proximal subclavian  atherosclerosis. Skeleton: Degenerative changes without acute or aggressive finding. Other neck: Small area of stranding and subcutaneous gas in the right low posterior triangle, question attempted vascular access. Upper chest: Paraseptal emphysema, mild. Review of the MIP images confirms the above findings CTA HEAD FINDINGS Anterior circulation: Atherosclerotic plaque on both carotid siphons. No flow limiting stenosis no major branch occlusion. Negative for aneurysm. Posterior circulation: Strong left vertebral artery dominance. The vertebral and basilar arteries are smooth and diffusely patent. Symmetric flow in bilateral PCAs. Hypoplastic left more than right P1 segments. Venous sinuses: Negative Anatomic variants: As above Delayed phase: No abnormal intracranial enhancement Review of the MIP images confirms the above findings IMPRESSION: 1. No acute finding. 2. Atherosclerosis without flow limiting stenosis or ulceration in the head or neck. 3. Extensive chronic small vessel ischemia in the cerebral white matter. 4. Small area of stranding and gas at the right neck base, question attempted vascular access. Electronically Signed   By: Monte Fantasia M.D.   On: 03/26/2017 11:46   Ct Angio Neck W And/or Wo Contrast  Result Date: 03/26/2017 CLINICAL DATA:  Stuttering. Stroke follow-up. History of diabetes with CBG of 15. EXAM: CT ANGIOGRAPHY HEAD AND NECK TECHNIQUE: Multidetector CT imaging of the head and neck was performed using the standard protocol during bolus administration of intravenous contrast. Multiplanar CT image reconstructions and MIPs were obtained to evaluate the vascular anatomy. Carotid stenosis measurements (when applicable) are obtained utilizing NASCET criteria, using the distal internal carotid diameter as the denominator. CONTRAST:  136m ISOVUE-370 IOPAMIDOL (ISOVUE-370) INJECTION 76% COMPARISON:  Brain MRI 06/12/2016 FINDINGS: CT HEAD FINDINGS Brain: Moderate to advanced low-density in  the cerebral white matter compatible with chronic small vessel ischemia. No acute hemorrhage, hydrocephalus, or masslike finding. Vascular: See below Skull: Negative Sinuses: Negative Orbits: Negative Review of the MIP images confirms the above findings CTA NECK FINDINGS Aortic arch: Mild atherosclerotic plaque. Left vertebral artery arises from the arch. Right carotid system: Atherosclerotic plaque along the common carotid artery. No flow limiting stenosis, ulceration, or beading. Left carotid system: Mild atherosclerotic plaque at the common carotid bifurcation. No flow limiting stenosis, ulceration, or beading. Vertebral arteries: Left dominant vertebral artery. Both vessels are smooth and diffusely patent to the dura. No proximal subclavian atherosclerosis. Skeleton: Degenerative changes without acute or aggressive finding. Other neck: Small area of stranding and subcutaneous gas in the right low posterior triangle, question attempted vascular access. Upper chest: Paraseptal emphysema, mild. Review of the MIP images confirms the above findings CTA HEAD FINDINGS Anterior circulation: Atherosclerotic plaque on both carotid siphons. No flow limiting stenosis no major branch occlusion. Negative for aneurysm. Posterior circulation: Strong left vertebral artery dominance. The vertebral and basilar arteries are smooth and diffusely patent. Symmetric flow in bilateral PCAs. Hypoplastic left more than right P1 segments. Venous sinuses: Negative Anatomic variants: As above Delayed phase: No abnormal intracranial enhancement Review of the  MIP images confirms the above findings IMPRESSION: 1. No acute finding. 2. Atherosclerosis without flow limiting stenosis or ulceration in the head or neck. 3. Extensive chronic small vessel ischemia in the cerebral white matter. 4. Small area of stranding and gas at the right neck base, question attempted vascular access. Electronically Signed   By: Monte Fantasia M.D.   On: 03/26/2017  11:46   Mr Brain Wo Contrast  Result Date: 03/26/2017 CLINICAL DATA:  Altered level of consciousness, hypoglycemia. History of bipolar disorder, diabetes and seizures. EXAM: MRI HEAD WITHOUT CONTRAST TECHNIQUE: Multiplanar, multiecho pulse sequences of the brain and surrounding structures were obtained without intravenous contrast. COMPARISON:  CT HEAD March 26, 2017 and MRI of the head Jun 12, 2016 FINDINGS: Multiple sequences are moderately motion degraded. INTRACRANIAL CONTENTS: No reduced diffusion to suggest acute ischemia. No susceptibility artifact to suggest hemorrhage. Mild parenchymal brain volume loss for age. No hydrocephalus. Patchy to confluent supratentorial white matter FLAIR T2 hyperintensities. Old bilateral thalamus lacunar infarcts. No suspicious parenchymal signal, masses, mass effect. No abnormal extra-axial fluid collections. No extra-axial masses. VASCULAR: Normal major intracranial vascular flow voids present at skull base. SKULL AND UPPER CERVICAL SPINE: No abnormal sellar expansion. No suspicious calvarial bone marrow signal. Craniocervical junction maintained. SINUSES/ORBITS: Mild paranasal sinus mucosal thickening. Small bilateral mastoid effusions.The included ocular globes and orbital contents are non-suspicious. OTHER: Patient is edentulous. IMPRESSION: 1. No acute intracranial process on this motion degraded examination. 2. Moderate to severe chronic small vessel ischemic disease and old bilateral thalamus lacunar infarcts. 3. Mild parenchymal brain volume loss. Electronically Signed   By: Elon Alas M.D.   On: 03/26/2017 19:11    Pending Labs Unresulted Labs (From admission, onward)   Start     Ordered   03/27/17 4536  Basic metabolic panel  Tomorrow morning,   R     03/26/17 1518   03/27/17 0500  CBC  Tomorrow morning,   R     03/26/17 1518   03/26/17 1518  Lactic acid, plasma  STAT Now then every 3 hours,   R     03/26/17 1518   03/26/17 1306  Urine culture   STAT,   STAT     03/26/17 1305      Vitals/Pain Today's Vitals   03/26/17 1440 03/26/17 1500 03/26/17 1636 03/26/17 1649  BP: (!) 145/79 (!) 141/85  (!) 155/87  Pulse: (!) 107 (!) 109  (!) 108  Resp: 16   (!) 25  Temp:      TempSrc:      SpO2: 100% 98%  99%  PainSc:   0-No pain     Isolation Precautions No active isolations  Medications Medications  sodium chloride 0.9 % injection (not administered)  dextrose 5 %-0.45 % sodium chloride infusion ( Intravenous New Bag/Given 03/26/17 1401)  ARIPiprazole (ABILIFY) tablet 5 mg (5 mg Oral Given 03/26/17 1930)  DULoxetine (CYMBALTA) DR capsule 60 mg (60 mg Oral Given 03/26/17 1930)  gabapentin (NEURONTIN) capsule 900 mg (900 mg Oral Given 04/29/78 3212)  folic acid (FOLVITE) tablet 1 mg (1 mg Oral Given 03/26/17 1930)  pantoprazole (PROTONIX) EC tablet 40 mg (40 mg Oral Given 03/26/17 1930)  thiamine (VITAMIN B-1) tablet 100 mg (100 mg Oral Given 03/26/17 1930)  multivitamin with minerals tablet 1 tablet (1 tablet Oral Given 03/26/17 1930)  gabapentin (NEURONTIN) capsule 1,200 mg (not administered)  dextrose 50 % solution 50 mL (50 mLs Intravenous Given 03/26/17 1000)  sodium chloride 0.9 % bolus  1,000 mL (0 mLs Intravenous Stopped 03/26/17 1636)  sodium chloride 0.9 % bolus 1,000 mL (0 mLs Intravenous Stopped 03/26/17 1636)  iopamidol (ISOVUE-370) 76 % injection (100 mLs  Contrast Given 03/26/17 1112)  dextrose 50 % solution 50 mL (50 mLs Intravenous Given 03/26/17 1134)  dextrose 50 % solution (50 mLs  Given 03/26/17 1538)  sodium chloride 0.9 % bolus 1,000 mL (1,000 mLs Intravenous New Bag/Given 03/26/17 1929)    Mobility non-ambulatory-very unsteady on feet

## 2017-03-26 NOTE — ED Notes (Signed)
Attempted to assist patient to beside commode and patient was unsteady on feet and not able to move feet to take steps. Patient was assisted back to bed.

## 2017-03-26 NOTE — ED Provider Notes (Signed)
[facility] DEPT Provider Note   CSN: [id number] Arrival date & time: 03/26/17  8592     History   Chief Complaint Chief Complaint  Patient presents with  . Hypoglycemia    HPI TRAE [name] is a 62 y.o. male.  HPI    62 year old male with history of bipolar disorder, diabetes, seizures, presents with concern for altered mental status and hypoglycemia.  Patient was last seen normal last night with family, and was reportedly in her normal state of health.  Patient reported that he gave himself insulin last night, but admits that he did not have anything to eat.  Reports usually he will eat something before bed.  His insulin dose has been increased approximately 1 month ago.  He is otherwise had no issues.  When patient's family, patient, he was altered, confused, and EMS arrived and found his glucose to be 14.  They were unable to establish IV access, but did give him glucagon.  His glucose after glucagon improved to 34.  On my exam, patient is initially very sleepy, is protecting his airway, but unable to answer questions.  After correction of glucose, history provided by patient reports that he had taken insulin last night, had anything to eat.  He denies any other infectious symptoms, including no urinary symptoms, no cough, no fever, no abdominal pain, no nausea, vomiting or diarrhea.  He acknowledges that he is having difficulty speaking, difficulty finding words and is stuttering which is new.  He denies weakness or numbness on one side or the other.  He does acknowledge some visual changes, but is unable to specify what type of changes. Levemir increased to 25U from 20 U previously. PCP is Dr. Lavone Neri, previously was IM clinic but now sees [facility].   Past Medical History:  Diagnosis Date  . Anxiety   . Arthritis    "joints; shoulders; feet" (06/08/2016)  . Bipolar disorder ([place])   . Daily headache    "7:30 - 8:00 q night" (06/08/2016)  . Diabetic  peripheral neuropathy ([place])   . GERD (gastroesophageal reflux disease)   . Pancreatitis   . Schizophrenia ([place])   . Seizures ([place]) 01/2016 X 2  . Type II diabetes mellitus [facility])     Patient Active Problem List   Diagnosis Date Noted  . Hypoglycemia 03/26/2017  . Acute metabolic encephalopathy [id number]/6286  . Other constipation 10/16/2016  . Recurrent falls 07/07/2016  . Wernicke's encephalopathy   . Altered mental status 06/09/2016  . Bipolar disorder ([place]) 06/01/2016  . Dry skin 01/18/2016  . Peripheral vascular disease ([place]) 11/15/2015  . Hyperglycemia 09/02/2015  . Tobacco abuse 09/02/2015  . Tricompartment osteoarthritis of both knees 06/15/2015  . Vitamin D deficiency 03/12/2015  . Hyperlipidemia 02/17/2015  . Protein-calorie malnutrition, severe 11/18/2014  . Alcohol use disorder   . Cervical arthritis 01/01/2014  . Health care maintenance 08/25/2011  . Chronic alcoholic pancreatitis ([place]) 04/20/2011  . Type 2 diabetes mellitus with peripheral neuropathy ([place]) 12/09/2010  . HTN (hypertension) 12/09/2010    Past Surgical History:  Procedure Laterality Date  . BALLOON DILATION  03/22/2011   Procedure: BALLOON DILATION;  Surgeon: Gatha Mayer, MD;  Location: [facility] ENDOSCOPY;  Service: Endoscopy;  Laterality: N/A;  . COLONOSCOPY N/A 05/22/2012   Procedure: COLONOSCOPY;  Surgeon: Gatha Mayer, MD;  Location: [facility];  Service: Endoscopy;  Laterality: N/A;  . COLONOSCOPY W/ BIOPSIES AND POLYPECTOMY  09/12/11  . ESOPHAGOGASTRODUODENOSCOPY  03/22/2011   Procedure: ESOPHAGOGASTRODUODENOSCOPY (EGD);  Surgeon: Gatha Mayer, MD;  Location: [facility];  Service: Endoscopy;  Laterality: N/A;  egd with balloon   . EUS  04/20/2011   Procedure: UPPER ENDOSCOPIC ULTRASOUND (EUS) LINEAR;  Surgeon: Milus Banister, MD;  Location: [facility] ENDOSCOPY;  Service: Endoscopy;  Laterality: N/A;  . KNEE ARTHROSCOPY Left        Home Medications    Prior to Admission medications     Medication Sig Start Date End Date Taking? Authorizing Provider  ACCU-CHEK SOFTCLIX LANCETS lancets Use 3 times daily to check blood sugar. diag code E11.42. Insulin dependent 01/05/17  Yes Rice, Resa Miner, MD  Alcohol Swabs (B-D SINGLE USE SWABS REGULAR) PADS Patient uses 6 times a day. Patient tests 3 times per day and administers insulin 3 times a day. ICD 10 code E11.42 01/05/17  Yes Rice, Resa Miner, MD  ARIPiprazole (ABILIFY) 5 MG tablet Take 1 tablet (5 mg total) by mouth daily. 01/05/17  Yes Rice, Resa Miner, MD  Blood Glucose Calibration (ACCU-CHEK AVIVA) SOLN 1 drop by In Vitro route as needed. diag code E08.10 insulin dependent. Use as device directs 08/25/16  Yes Rice, Resa Miner, MD  Blood Glucose Monitoring Suppl (ACCU-CHEK AVIVA PLUS) w/Device KIT 1 kit by Does not apply route every morning. 10/15/16  Yes Sid Falcon, MD  diclofenac sodium (VOLTAREN) 1 % GEL Apply 2 g topically 4 (four) times daily -  before meals and at bedtime. 01/05/17  Yes Rice, Resa Miner, MD  DULoxetine (CYMBALTA) 60 MG capsule Take 1 capsule (60 mg total) by mouth daily. 10/15/16  Yes Sid Falcon, MD  folic acid (FOLVITE) 1 MG tablet Take 1 tablet (1 mg total) by mouth daily. 01/05/17  Yes Rice, Resa Miner, MD  gabapentin (NEURONTIN) 300 MG capsule TAKE 3 CAPSULES BY MOUTH  EVERY MORNING AND  AFTERNOON. TAKE 4 CAPSULES  BY MOUTH AT NIGHT BEFORE  BED Patient taking differently: Take 900-1,200 mg by mouth 3 (three) times daily. TAKE 3 CAPSULES BY MOUTH  EVERY MORNING AND  AFTERNOON. TAKE 4 CAPSULES  BY MOUTH AT NIGHT BEFORE  BED 01/05/17  Yes Rice, Resa Miner, MD  glucose blood (ACCU-CHEK AVIVA PLUS) test strip Use 3 times daily to check blood sugar. diag code E11.42. Insulin dependent 10/30/16  Yes Hoffman, Jessica Ratliff, DO  hydrOXYzine (ATARAX/VISTARIL) 10 MG tablet Take 1 tablet (10 mg total) by mouth 3 (three) times daily as needed for itching. 01/05/17  Yes Rice, Resa Miner, MD   ibuprofen (ADVIL,MOTRIN) 800 MG tablet Take 1 tablet (800 mg total) by mouth 3 (three) times daily as needed for moderate pain. 01/05/17  Yes Rice, Resa Miner, MD  insulin aspart (NOVOLOG) 100 UNIT/ML FlexPen Take 5 units before breakfast, 10 units before lunch, and 10 units before dinner Patient taking differently: Inject 5-10 Units into the skin 3 (three) times daily before meals. Take 5 units before breakfast, 10 units before lunch, and 10 units before dinner 01/05/17  Yes Rice, Resa Miner, MD  LEVEMIR FLEXTOUCH 100 UNIT/ML Pen Inject 5 Units into the skin every morning. 01/05/17  Yes Rice, Resa Miner, MD  lipase/protease/amylase (CREON) 36000 UNITS CPEP capsule TAKE 1 CAPSULE BY MOUTH 3  TIMES DAILY BEFORE MEALS 01/05/17  Yes Rice, Resa Miner, MD  Multiple Vitamin (MULTIVITAMIN WITH MINERALS) TABS tablet Take 1 tablet by mouth daily. 10/15/16  Yes Sid Falcon, MD  omega-3 acid ethyl esters (LOVAZA) 1 g capsule Take 1 capsule (1 g total) by mouth daily. 10/15/16  Yes Sid Falcon, MD  pantoprazole (PROTONIX) 40 MG tablet Take 1 tablet (40 mg total) by mouth daily. 01/05/17  Yes Rice, Resa Miner, MD  pravastatin (PRAVACHOL) 10 MG tablet Take 1 tablet (10 mg total) by mouth daily at 6 PM. 01/05/17  Yes Rice, Resa Miner, MD  thiamine (VITAMIN B-1) 100 MG tablet Take 1 tablet (100 mg total) by mouth daily. 01/05/17  Yes Rice, Resa Miner, MD    Family History Family History  Problem Relation Age of Onset  . Hypertension Sister   . Diabetes Sister   . Heart disease Sister   . Colon cancer Neg Hx   . Stomach cancer Neg Hx   . Anesthesia problems Neg Hx   . Hypotension Neg Hx   . Pseudochol deficiency Neg Hx   . Malignant hyperthermia Neg Hx     Social History Social History   Tobacco Use  . Smoking status: Current Every Day Smoker    Packs/day: 0.50    Years: 42.00    Pack years: 21.00    Types: Cigarettes  . Smokeless tobacco: Never Used  . Tobacco  comment: Sometimes less.  Substance Use Topics  . Alcohol use: Yes    Alcohol/week: 36.0 oz    Types: 50 Shots of liquor, 10 Standard drinks or equivalent per week    Comment: Occasionally.  . Drug use: Yes    Types: Marijuana    Comment: Sometimes.     Allergies   Ace inhibitors; Losartan; and Tylenol [acetaminophen]   Review of Systems Review of Systems  Constitutional: Negative for fever.  HENT: Negative for sore throat.   Eyes: Negative for visual disturbance.  Respiratory: Negative for shortness of breath.   Cardiovascular: Negative for chest pain.  Gastrointestinal: Negative for abdominal pain, nausea and vomiting.  Genitourinary: Negative for difficulty urinating.  Musculoskeletal: Negative for back pain and neck stiffness.  Skin: Negative for rash.  Neurological: Positive for speech difficulty. Negative for syncope, facial asymmetry, weakness, numbness and headaches.     Physical Exam Updated Vital Signs BP (!) 141/85   Pulse (!) 109   Temp 98 F (36.7 C) (Oral)   Resp 16   SpO2 98%   Physical Exam  Constitutional: He is oriented to person, place, and time. He appears well-developed and well-nourished. No distress.  HENT:  Head: Normocephalic and atraumatic.  Eyes: Conjunctivae and EOM are normal.  Neck: Normal range of motion.  Cardiovascular: Normal rate, regular rhythm, normal heart sounds and intact distal pulses. Exam reveals no gallop and no friction rub.  No murmur heard. Pulmonary/Chest: Effort normal and breath sounds normal. No respiratory distress. He has no wheezes. He has no rales.  Abdominal: Soft. He exhibits no distension. There is no tenderness. There is no guarding.  Musculoskeletal: He exhibits no edema.  Neurological: He is alert and oriented to person, place, and time. He has normal strength. He displays tremor. No cranial nerve deficit (EOM appear normal although has disconjugate gaze. ) or sensory deficit. Abnormal gait: deferred. GCS  eye subscore is 4. GCS verbal subscore is 5. GCS motor subscore is 6.  Difficulty finding words, stuttering speech  Skin: Skin is warm and dry. He is not diaphoretic.  Nursing note and vitals reviewed.    ED Treatments / Results  Labs (all labs ordered are listed, but only abnormal results are displayed) Labs Reviewed  COMPREHENSIVE METABOLIC PANEL - Abnormal; Notable for the following components:      Result Value  Glucose, Bld 62 (*)    Calcium 8.0 (*)    AST 177 (*)    ALT 148 (*)    Alkaline Phosphatase 309 (*)    All other components within normal limits  URINALYSIS, ROUTINE W REFLEX MICROSCOPIC - Abnormal; Notable for the following components:   Color, Urine AMBER (*)    Specific Gravity, Urine 1.042 (*)    Glucose, UA >=500 (*)    Hgb urine dipstick MODERATE (*)    Squamous Epithelial / LPF 0-5 (*)    All other components within normal limits  LACTIC ACID, PLASMA - Abnormal; Notable for the following components:   Lactic Acid, Venous 3.3 (*)    All other components within normal limits  CBG MONITORING, ED - Abnormal; Notable for the following components:   Glucose-Capillary 34 (*)    All other components within normal limits  I-STAT CHEM 8, ED - Abnormal; Notable for the following components:   Glucose, Bld 56 (*)    Calcium, Ion 1.00 (*)    All other components within normal limits  I-STAT CG4 LACTIC ACID, ED - Abnormal; Notable for the following components:   Lactic Acid, Venous 3.44 (*)    All other components within normal limits  CBG MONITORING, ED - Abnormal; Notable for the following components:   Glucose-Capillary 161 (*)    All other components within normal limits  CBG MONITORING, ED - Abnormal; Notable for the following components:   Glucose-Capillary 103 (*)    All other components within normal limits  I-STAT CG4 LACTIC ACID, ED - Abnormal; Notable for the following components:   Lactic Acid, Venous 3.53 (*)    All other components within normal  limits  CBG MONITORING, ED - Abnormal; Notable for the following components:   Glucose-Capillary 41 (*)    All other components within normal limits  CBG MONITORING, ED - Abnormal; Notable for the following components:   Glucose-Capillary 34 (*)    All other components within normal limits  CBG MONITORING, ED - Abnormal; Notable for the following components:   Glucose-Capillary 177 (*)    All other components within normal limits  URINE CULTURE  CBC WITH DIFFERENTIAL/PLATELET  LACTIC ACID, PLASMA  I-STAT TROPONIN, ED  CBG MONITORING, ED    EKG  EKG Interpretation  Date/Time:  Monday March 26 2017 09:57:57 EST Ventricular Rate:  96 PR Interval:    QRS Duration: 84 QT Interval:  381 QTC Calculation: 482 R Axis:   62 Text Interpretation:  Sinus rhythm Short PR interval Borderline prolonged QT interval Baseline wander in lead(s) V6 No significant change since last tracing Confirmed by Gareth Morgan [phone]) on 03/26/2017 11:21:26 AM       Radiology Ct Angio Head W Or Wo Contrast  Result Date: 03/26/2017 CLINICAL DATA:  Stuttering. Stroke follow-up. History of diabetes with CBG of 15. EXAM: CT ANGIOGRAPHY HEAD AND NECK TECHNIQUE: Multidetector CT imaging of the head and neck was performed using the standard protocol during bolus administration of intravenous contrast. Multiplanar CT image reconstructions and MIPs were obtained to evaluate the vascular anatomy. Carotid stenosis measurements (when applicable) are obtained utilizing NASCET criteria, using the distal internal carotid diameter as the denominator. CONTRAST:  1103m ISOVUE-370 IOPAMIDOL (ISOVUE-370) INJECTION 76% COMPARISON:  Brain MRI 06/12/2016 FINDINGS: CT HEAD FINDINGS Brain: Moderate to advanced low-density in the cerebral white matter compatible with chronic small vessel ischemia. No acute hemorrhage, hydrocephalus, or masslike finding. Vascular: See below Skull: Negative Sinuses: Negative Orbits: Negative Review of  the  MIP images confirms the above findings CTA NECK FINDINGS Aortic arch: Mild atherosclerotic plaque. Left vertebral artery arises from the arch. Right carotid system: Atherosclerotic plaque along the common carotid artery. No flow limiting stenosis, ulceration, or beading. Left carotid system: Mild atherosclerotic plaque at the common carotid bifurcation. No flow limiting stenosis, ulceration, or beading. Vertebral arteries: Left dominant vertebral artery. Both vessels are smooth and diffusely patent to the dura. No proximal subclavian atherosclerosis. Skeleton: Degenerative changes without acute or aggressive finding. Other neck: Small area of stranding and subcutaneous gas in the right low posterior triangle, question attempted vascular access. Upper chest: Paraseptal emphysema, mild. Review of the MIP images confirms the above findings CTA HEAD FINDINGS Anterior circulation: Atherosclerotic plaque on both carotid siphons. No flow limiting stenosis no major branch occlusion. Negative for aneurysm. Posterior circulation: Strong left vertebral artery dominance. The vertebral and basilar arteries are smooth and diffusely patent. Symmetric flow in bilateral PCAs. Hypoplastic left more than right P1 segments. Venous sinuses: Negative Anatomic variants: As above Delayed phase: No abnormal intracranial enhancement Review of the MIP images confirms the above findings IMPRESSION: 1. No acute finding. 2. Atherosclerosis without flow limiting stenosis or ulceration in the head or neck. 3. Extensive chronic small vessel ischemia in the cerebral white matter. 4. Small area of stranding and gas at the right neck base, question attempted vascular access. Electronically Signed   By: Monte Fantasia M.D.   On: 03/26/2017 11:46   Dg Chest 2 View  Result Date: 03/26/2017 CLINICAL DATA:  Hypoglycemia. EXAM: CHEST  2 VIEW COMPARISON:  06/08/2016 FINDINGS: Cardiac silhouette is normal in size. No mediastinal or hilar masses. No  evidence of adenopathy. Clear lungs.  No pleural effusion or pneumothorax. Skeletal structures are intact. IMPRESSION: No active cardiopulmonary disease. Electronically Signed   By: Lajean Manes M.D.   On: 03/26/2017 13:41   Ct Head Wo Contrast  Result Date: 03/26/2017 CLINICAL DATA:  Stuttering. Stroke follow-up. History of diabetes with CBG of 15. EXAM: CT ANGIOGRAPHY HEAD AND NECK TECHNIQUE: Multidetector CT imaging of the head and neck was performed using the standard protocol during bolus administration of intravenous contrast. Multiplanar CT image reconstructions and MIPs were obtained to evaluate the vascular anatomy. Carotid stenosis measurements (when applicable) are obtained utilizing NASCET criteria, using the distal internal carotid diameter as the denominator. CONTRAST:  179m ISOVUE-370 IOPAMIDOL (ISOVUE-370) INJECTION 76% COMPARISON:  Brain MRI 06/12/2016 FINDINGS: CT HEAD FINDINGS Brain: Moderate to advanced low-density in the cerebral white matter compatible with chronic small vessel ischemia. No acute hemorrhage, hydrocephalus, or masslike finding. Vascular: See below Skull: Negative Sinuses: Negative Orbits: Negative Review of the MIP images confirms the above findings CTA NECK FINDINGS Aortic arch: Mild atherosclerotic plaque. Left vertebral artery arises from the arch. Right carotid system: Atherosclerotic plaque along the common carotid artery. No flow limiting stenosis, ulceration, or beading. Left carotid system: Mild atherosclerotic plaque at the common carotid bifurcation. No flow limiting stenosis, ulceration, or beading. Vertebral arteries: Left dominant vertebral artery. Both vessels are smooth and diffusely patent to the dura. No proximal subclavian atherosclerosis. Skeleton: Degenerative changes without acute or aggressive finding. Other neck: Small area of stranding and subcutaneous gas in the right low posterior triangle, question attempted vascular access. Upper chest:  Paraseptal emphysema, mild. Review of the MIP images confirms the above findings CTA HEAD FINDINGS Anterior circulation: Atherosclerotic plaque on both carotid siphons. No flow limiting stenosis no major branch occlusion. Negative for aneurysm. Posterior circulation: Strong left vertebral  artery dominance. The vertebral and basilar arteries are smooth and diffusely patent. Symmetric flow in bilateral PCAs. Hypoplastic left more than right P1 segments. Venous sinuses: Negative Anatomic variants: As above Delayed phase: No abnormal intracranial enhancement Review of the MIP images confirms the above findings IMPRESSION: 1. No acute finding. 2. Atherosclerosis without flow limiting stenosis or ulceration in the head or neck. 3. Extensive chronic small vessel ischemia in the cerebral white matter. 4. Small area of stranding and gas at the right neck base, question attempted vascular access. Electronically Signed   By: Monte Fantasia M.D.   On: 03/26/2017 11:46   Ct Angio Neck W And/or Wo Contrast  Result Date: 03/26/2017 CLINICAL DATA:  Stuttering. Stroke follow-up. History of diabetes with CBG of 15. EXAM: CT ANGIOGRAPHY HEAD AND NECK TECHNIQUE: Multidetector CT imaging of the head and neck was performed using the standard protocol during bolus administration of intravenous contrast. Multiplanar CT image reconstructions and MIPs were obtained to evaluate the vascular anatomy. Carotid stenosis measurements (when applicable) are obtained utilizing NASCET criteria, using the distal internal carotid diameter as the denominator. CONTRAST:  147m ISOVUE-370 IOPAMIDOL (ISOVUE-370) INJECTION 76% COMPARISON:  Brain MRI 06/12/2016 FINDINGS: CT HEAD FINDINGS Brain: Moderate to advanced low-density in the cerebral white matter compatible with chronic small vessel ischemia. No acute hemorrhage, hydrocephalus, or masslike finding. Vascular: See below Skull: Negative Sinuses: Negative Orbits: Negative Review of the MIP images  confirms the above findings CTA NECK FINDINGS Aortic arch: Mild atherosclerotic plaque. Left vertebral artery arises from the arch. Right carotid system: Atherosclerotic plaque along the common carotid artery. No flow limiting stenosis, ulceration, or beading. Left carotid system: Mild atherosclerotic plaque at the common carotid bifurcation. No flow limiting stenosis, ulceration, or beading. Vertebral arteries: Left dominant vertebral artery. Both vessels are smooth and diffusely patent to the dura. No proximal subclavian atherosclerosis. Skeleton: Degenerative changes without acute or aggressive finding. Other neck: Small area of stranding and subcutaneous gas in the right low posterior triangle, question attempted vascular access. Upper chest: Paraseptal emphysema, mild. Review of the MIP images confirms the above findings CTA HEAD FINDINGS Anterior circulation: Atherosclerotic plaque on both carotid siphons. No flow limiting stenosis no major branch occlusion. Negative for aneurysm. Posterior circulation: Strong left vertebral artery dominance. The vertebral and basilar arteries are smooth and diffusely patent. Symmetric flow in bilateral PCAs. Hypoplastic left more than right P1 segments. Venous sinuses: Negative Anatomic variants: As above Delayed phase: No abnormal intracranial enhancement Review of the MIP images confirms the above findings IMPRESSION: 1. No acute finding. 2. Atherosclerosis without flow limiting stenosis or ulceration in the head or neck. 3. Extensive chronic small vessel ischemia in the cerebral white matter. 4. Small area of stranding and gas at the right neck base, question attempted vascular access. Electronically Signed   By: JMonte FantasiaM.D.   On: 03/26/2017 11:46    Procedures .Critical Care Performed by: SGareth Morgan MD Authorized by: SGareth Morgan MD   Critical care provider statement:    Critical care time (minutes):  40   Critical care was necessary to  treat or prevent imminent or life-threatening deterioration of the following conditions:  Metabolic crisis   Critical care was time spent personally by me on the following activities:  Discussions with consultants, evaluation of patient's response to treatment, examination of patient, pulse oximetry, ordering and review of radiographic studies, ordering and review of laboratory studies, re-evaluation of patient's condition and review of old charts Angiocath insertion Date/Time: 03/26/2017 4:31  PM Performed by: Gareth Morgan, MD Authorized by: Gareth Morgan, MD  Consent: The procedure was performed in an emergent situation. Time out: Immediately prior to procedure a "time out" was called to verify the correct patient, procedure, equipment, support staff and site/side marked as required. Preparation: Patient was prepped and draped in the usual sterile fashion. Local anesthesia used: no  Anesthesia: Local anesthesia used: no  Sedation: Patient sedated: no  Patient tolerance: Patient tolerated the procedure well with no immediate complications Comments: 18E right AC by US guidance.    (including critical care time)  Medications Ordered in ED Medications  sodium chloride 0.9 % injection (not administered)  dextrose 5 %-0.45 % sodium chloride infusion ( Intravenous New Bag/Given 03/26/17 1401)  ARIPiprazole (ABILIFY) tablet 5 mg (not administered)  DULoxetine (CYMBALTA) DR capsule 60 mg (not administered)  gabapentin (NEURONTIN) capsule 900 mg (not administered)  folic acid (FOLVITE) tablet 1 mg (not administered)  pantoprazole (PROTONIX) EC tablet 40 mg (not administered)  thiamine (VITAMIN B-1) tablet 100 mg (not administered)  multivitamin with minerals tablet 1 tablet (not administered)  gabapentin (NEURONTIN) capsule 1,200 mg (not administered)  sodium chloride 0.9 % bolus 1,000 mL (not administered)  dextrose 50 % solution 50 mL (50 mLs Intravenous Given 03/26/17 1000)  sodium  chloride 0.9 % bolus 1,000 mL (0 mLs Intravenous Stopped 03/26/17 1636)  sodium chloride 0.9 % bolus 1,000 mL (0 mLs Intravenous Stopped 03/26/17 1636)  iopamidol (ISOVUE-370) 76 % injection (100 mLs  Contrast Given 03/26/17 1112)  dextrose 50 % solution 50 mL (50 mLs Intravenous Given 03/26/17 1134)  dextrose 50 % solution (50 mLs  Given 03/26/17 1538)     Initial Impression / Assessment and Plan / ED Course  I have reviewed the triage vital signs and the nursing notes.  Pertinent labs & imaging results that were available during my care of the patient were reviewed by me and considered in my medical decision making (see chart for details).     62 year old male with history of bipolar disorder, diabetes, seizures, presents with concern for altered mental status and hypoglycemia.  Patient with glucose of 14 on EMS arrival.  They are unable to obtain IV access, but did give glucagon.  On arrival to the emergency department, point-of-care glucose is 34.  Patient is protecting his airway, however too altered to obtain history initially.  Placed ultrasound-guided IV, and given amp of D50, with patient waking up more, but noted to have stuttering speech.  Repeat glucose is improved, however patient continues to have difficulty finding words and stuttering speech concerning for new onset aphasia.  Given last known normal last night, without other focal weakness, he is not within tPA window and overall suspect neurologic deficit is secondary to severe hypoglycemia.  CT head and CT angio head and neck was performed which showed no evidence of large vessel cutoff, and no evidence of other intra-or acute intracranial abnormality.  Called neurology to discuss, suspect more likely hypoglycemic brain injury than acute ischemic stroke, however will plan to obtain MR and EEG. If any concerns for ischemia, would plan on Neuro consult and consider transfer to [facility]--however at this time suspect more likely metabolic  abnormalities.   Patient with repeat hypoglycemia in the ED, given additional D50 and placed on D5 gtt.  Suspect hypoglycemia in setting of increased dose of levemir and not eating last night. He has mild transaminitis but no abdominal pain or tenderness.  No sign of infection. Lactic acidosis likely secondary  to hypoglycemia.  Pt with eventual IV infiltration after admission and second IV established and pt found to be hypoglycemic again and was given D50 with gtt continuing.   Will admit to hospitalist for continued care.  Final Clinical Impressions(s) / ED Diagnoses   Final diagnoses:  Hypoglycemia  Aphasia  Altered mental status, unspecified altered mental status type    ED Discharge Orders    None       Gareth Morgan, MD 03/26/17 1642

## 2017-03-26 NOTE — ED Notes (Signed)
Patient transported to MRI 

## 2017-03-26 NOTE — H&P (Signed)
History and Physical    DEVLON DOSHER IRJ:[id number] DOB: 04/29/1955 DOA: 03/26/2017  PCP: Patient, No Pcp Per  Patient coming from: Home  Chief Complaint: Found down in his sugar was 14  HPI: VERL KITSON is a 62 y.o. male with medical history significant of diabetes, alcohol abuse, bipolar disorder, seizures was found down this morning by family members EMS was called and his sugar was 14.  Patient was unresponsive at the time.  Since then he has been giving glucose and his mental status has returned to normal.  Patient is however reporting some stuttering that is a new problem for him.  He does not recall what happened last night.  He does recall not having any recent illnesses though prior to last night.  He denies any nausea vomiting or diarrhea.  Patient is hungry and is up in his bed about to eat.  He denies any numbness tingling or weakness anywhere.  The stuttering is new and is not improved with resolution of his hypoglycemia.  Patient is referred for admission for his severe hypoglycemia along with new stuttering.  Review of Systems: As per HPI otherwise 10 point review of systems negative.   Past Medical History:  Diagnosis Date  . Anxiety   . Arthritis    "joints; shoulders; feet" (06/08/2016)  . Bipolar disorder ([place])   . Daily headache    "7:30 - 8:00 q night" (06/08/2016)  . Diabetic peripheral neuropathy ([place])   . GERD (gastroesophageal reflux disease)   . Pancreatitis   . Schizophrenia ([place])   . Seizures ([place]) 01/2016 X 2  . Type II diabetes mellitus ([place])     Past Surgical History:  Procedure Laterality Date  . BALLOON DILATION  03/22/2011   Procedure: BALLOON DILATION;  Surgeon: Gatha Mayer, MD;  Location: [facility];  Service: Endoscopy;  Laterality: N/A;  . COLONOSCOPY N/A 05/22/2012   Procedure: COLONOSCOPY;  Surgeon: Gatha Mayer, MD;  Location: [facility];  Service: Endoscopy;  Laterality: N/A;  . COLONOSCOPY W/ BIOPSIES AND POLYPECTOMY  09/12/11  .  ESOPHAGOGASTRODUODENOSCOPY  03/22/2011   Procedure: ESOPHAGOGASTRODUODENOSCOPY (EGD);  Surgeon: Gatha Mayer, MD;  Location: [facility];  Service: Endoscopy;  Laterality: N/A;  egd with balloon   . EUS  04/20/2011   Procedure: UPPER ENDOSCOPIC ULTRASOUND (EUS) LINEAR;  Surgeon: Milus Banister, MD;  Location: [facility];  Service: Endoscopy;  Laterality: N/A;  . KNEE ARTHROSCOPY Left      reports that he has been smoking cigarettes.  He has a 21.00 pack-year smoking history. he has never used smokeless tobacco. He reports that he drinks about 36.0 oz of alcohol per week. He reports that he uses drugs. Drug: Marijuana.  Allergies  Allergen Reactions  . Ace Inhibitors Swelling and Other (See Comments)    Angioedema - unquestionable  . Losartan Swelling and Other (See Comments)    Pt presented with unquestionable angioedema on ACEIs (Angiotensin-converting enzyme (ACE) inhibitors) so aviod ARBs (Angiotensin receptor blockers), as well  . Tylenol [Acetaminophen] Other (See Comments)    "cannot have this" (contraindicated)    Family History  Problem Relation Age of Onset  . Hypertension Sister   . Diabetes Sister   . Heart disease Sister   . Colon cancer Neg Hx   . Stomach cancer Neg Hx   . Anesthesia problems Neg Hx   . Hypotension Neg Hx   . Pseudochol deficiency Neg Hx   . Malignant hyperthermia Neg Hx  Prior to Admission medications   Medication Sig Start Date End Date Taking? Authorizing Provider  ACCU-CHEK SOFTCLIX LANCETS lancets Use 3 times daily to check blood sugar. diag code E11.42. Insulin dependent 01/05/17  Yes Rice, Resa Miner, MD  Alcohol Swabs (B-D SINGLE USE SWABS REGULAR) PADS Patient uses 6 times a day. Patient tests 3 times per day and administers insulin 3 times a day. ICD 10 code E11.42 01/05/17  Yes Rice, Resa Miner, MD  ARIPiprazole (ABILIFY) 5 MG tablet Take 1 tablet (5 mg total) by mouth daily. 01/05/17  Yes Rice, Resa Miner, MD  Blood  Glucose Calibration (ACCU-CHEK AVIVA) SOLN 1 drop by In Vitro route as needed. diag code E08.10 insulin dependent. Use as device directs 08/25/16  Yes Rice, Resa Miner, MD  Blood Glucose Monitoring Suppl (ACCU-CHEK AVIVA PLUS) w/Device KIT 1 kit by Does not apply route every morning. 10/15/16  Yes Sid Falcon, MD  diclofenac sodium (VOLTAREN) 1 % GEL Apply 2 g topically 4 (four) times daily -  before meals and at bedtime. 01/05/17  Yes Rice, Resa Miner, MD  DULoxetine (CYMBALTA) 60 MG capsule Take 1 capsule (60 mg total) by mouth daily. 10/15/16  Yes Sid Falcon, MD  folic acid (FOLVITE) 1 MG tablet Take 1 tablet (1 mg total) by mouth daily. 01/05/17  Yes Rice, Resa Miner, MD  gabapentin (NEURONTIN) 300 MG capsule TAKE 3 CAPSULES BY MOUTH  EVERY MORNING AND  AFTERNOON. TAKE 4 CAPSULES  BY MOUTH AT NIGHT BEFORE  BED Patient taking differently: Take 900-1,200 mg by mouth 3 (three) times daily. TAKE 3 CAPSULES BY MOUTH  EVERY MORNING AND  AFTERNOON. TAKE 4 CAPSULES  BY MOUTH AT NIGHT BEFORE  BED 01/05/17  Yes Rice, Resa Miner, MD  glucose blood (ACCU-CHEK AVIVA PLUS) test strip Use 3 times daily to check blood sugar. diag code E11.42. Insulin dependent 10/30/16  Yes Hoffman, Jessica Ratliff, DO  hydrOXYzine (ATARAX/VISTARIL) 10 MG tablet Take 1 tablet (10 mg total) by mouth 3 (three) times daily as needed for itching. 01/05/17  Yes Rice, Resa Miner, MD  ibuprofen (ADVIL,MOTRIN) 800 MG tablet Take 1 tablet (800 mg total) by mouth 3 (three) times daily as needed for moderate pain. 01/05/17  Yes Rice, Resa Miner, MD  insulin aspart (NOVOLOG) 100 UNIT/ML FlexPen Take 5 units before breakfast, 10 units before lunch, and 10 units before dinner Patient taking differently: Inject 5-10 Units into the skin 3 (three) times daily before meals. Take 5 units before breakfast, 10 units before lunch, and 10 units before dinner 01/05/17  Yes Rice, Resa Miner, MD  LEVEMIR FLEXTOUCH 100 UNIT/ML Pen  Inject 5 Units into the skin every morning. 01/05/17  Yes Rice, Resa Miner, MD  lipase/protease/amylase (CREON) 36000 UNITS CPEP capsule TAKE 1 CAPSULE BY MOUTH 3  TIMES DAILY BEFORE MEALS 01/05/17  Yes Rice, Resa Miner, MD  Multiple Vitamin (MULTIVITAMIN WITH MINERALS) TABS tablet Take 1 tablet by mouth daily. 10/15/16  Yes Sid Falcon, MD  omega-3 acid ethyl esters (LOVAZA) 1 g capsule Take 1 capsule (1 g total) by mouth daily. 10/15/16  Yes Sid Falcon, MD  pantoprazole (PROTONIX) 40 MG tablet Take 1 tablet (40 mg total) by mouth daily. 01/05/17  Yes Rice, Resa Miner, MD  pravastatin (PRAVACHOL) 10 MG tablet Take 1 tablet (10 mg total) by mouth daily at 6 PM. 01/05/17  Yes Rice, Resa Miner, MD  thiamine (VITAMIN B-1) 100 MG tablet Take 1 tablet (100 mg total) by  mouth daily. 01/05/17  Yes Collier Salina, MD    Physical Exam: Vitals:   03/26/17 1200 03/26/17 1224 03/26/17 1230 03/26/17 1440  BP: (!) 154/98  (!) 146/88 (!) 145/79  Pulse: 98  89 (!) 107  Resp: (!) _0 Temp:  98 F (36.7 C)    TempSrc:  Oral    SpO2: 98%  100% 100%      Constitutional: NAD, calm, comfortable stuttering Vitals:   03/26/17 1200 03/26/17 1224 03/26/17 1230 03/26/17 1440  BP: (!) 154/98  (!) 146/88 (!) 145/79  Pulse: 98  89 (!) 107  Resp: (!) _1 Temp:  98 F (36.7 C)    TempSrc:  Oral    SpO2: 98%  100% 100%   Eyes: PERRL, lids and conjunctivae normal ENMT: Mucous membranes are moist. Posterior pharynx clear of any exudate or lesions.Normal dentition.  Neck: normal, supple, no masses, no thyromegaly Respiratory: clear to auscultation bilaterally, no wheezing, no crackles. Normal respiratory effort. No accessory muscle use.  Cardiovascular: Regular rate and rhythm, no murmurs / rubs / gallops. No extremity edema. 2+ pedal pulses. No carotid bruits.  Abdomen: no tenderness, no masses palpated. No hepatosplenomegaly. Bowel sounds positive.  Musculoskeletal: no  clubbing / cyanosis. No joint deformity upper and lower extremities. Good ROM, no contractures. Normal muscle tone.  Skin: no rashes, lesions, ulcers. No induration Neurologic: CN 2-12 grossly intact. Sensation intact, DTR normal. Strength 5/5 in all 4.  Psychiatric: Normal judgment and insight. Alert and oriented x 3. Normal mood.    Labs on Admission: I have personally reviewed following labs and imaging studies  CBC: Recent Labs  Lab 03/26/17 0939 03/26/17 0958  WBC 8.9  --   NEUTROABS 7.4  --   HGB 16.1 16.7  HCT 45.7 49.0  MCV 96.4  --   PLT 233  --    Basic Metabolic Panel: Recent Labs  Lab 03/26/17 0939 03/26/17 0958  NA 145 145  K 3.8 3.8  CL 106 104  CO2 26  --   GLUCOSE 62* 56*  BUN 15 16  CREATININE 0.68 0.90  CALCIUM 8.0*  --    GFR: CrCl cannot be calculated (Unknown ideal weight.). Liver Function Tests: Recent Labs  Lab 03/26/17 0939  AST 177*  ALT 148*  ALKPHOS 309*  BILITOT 0.8  PROT 6.9  ALBUMIN 4.0   No results for input(s): LIPASE, AMYLASE in the last 168 hours. No results for input(s): AMMONIA in the last 168 hours. Coagulation Profile: No results for input(s): INR, PROTIME in the last 168 hours. Cardiac Enzymes: No results for input(s): CKTOTAL, CKMB, CKMBINDEX, TROPONINI in the last 168 hours. BNP (last 3 results) No results for input(s): PROBNP in the last 8760 hours. HbA1C: No results for input(s): HGBA1C in the last 72 hours. CBG: Recent Labs  Lab 03/26/17 0942 03/26/17 1002 03/26/17 1034 03/26/17 1130 03/26/17 1218  GLUCAP 34* 161* 103* 41* 84   Lipid Profile: No results for input(s): CHOL, HDL, LDLCALC, TRIG, CHOLHDL, LDLDIRECT in the last 72 hours. Thyroid Function Tests: No results for input(s): TSH, T4TOTAL, FREET4, T3FREE, THYROIDAB in the last 72 hours. Anemia Panel: No results for input(s): VITAMINB12, FOLATE, FERRITIN, TIBC, IRON, RETICCTPCT in the last 72 hours. Urine analysis:    Component Value Date/Time     COLORURINE AMBER (A) 03/26/2017 1311   APPEARANCEUR CLEAR 03/26/2017 1311   LABSPEC 1.042 (H) 03/26/2017 1311   PHURINE 5.0 03/26/2017 1311  GLUCOSEU >=500 (A) 03/26/2017 1311   HGBUR MODERATE (A) 03/26/2017 1311   BILIRUBINUR NEGATIVE 03/26/2017 1311   KETONESUR NEGATIVE 03/26/2017 1311   PROTEINUR NEGATIVE 03/26/2017 1311   UROBILINOGEN 0.2 12/07/2014 0555   NITRITE NEGATIVE 03/26/2017 1311   LEUKOCYTESUR NEGATIVE 03/26/2017 1311   Sepsis Labs: !!!!!!!!!!!!!!!!!!!!!!!!!!!!!!!!!!!!!!!!!!!! _0 (procalcitonin:4,lacticidven:4) )No results found for this or any previous visit (from the past 240 hour(s)).   Radiological Exams on Admission: Ct Angio Head W Or Wo Contrast  Result Date: 03/26/2017 CLINICAL DATA:  Stuttering. Stroke follow-up. History of diabetes with CBG of 15. EXAM: CT ANGIOGRAPHY HEAD AND NECK TECHNIQUE: Multidetector CT imaging of the head and neck was performed using the standard protocol during bolus administration of intravenous contrast. Multiplanar CT image reconstructions and MIPs were obtained to evaluate the vascular anatomy. Carotid stenosis measurements (when applicable) are obtained utilizing NASCET criteria, using the distal internal carotid diameter as the denominator. CONTRAST:  155m ISOVUE-370 IOPAMIDOL (ISOVUE-370) INJECTION 76% COMPARISON:  Brain MRI 06/12/2016 FINDINGS: CT HEAD FINDINGS Brain: Moderate to advanced low-density in the cerebral white matter compatible with chronic small vessel ischemia. No acute hemorrhage, hydrocephalus, or masslike finding. Vascular: See below Skull: Negative Sinuses: Negative Orbits: Negative Review of the MIP images confirms the above findings CTA NECK FINDINGS Aortic arch: Mild atherosclerotic plaque. Left vertebral artery arises from the arch. Right carotid system: Atherosclerotic plaque along the common carotid artery. No flow limiting stenosis, ulceration, or beading. Left carotid system: Mild atherosclerotic plaque  at the common carotid bifurcation. No flow limiting stenosis, ulceration, or beading. Vertebral arteries: Left dominant vertebral artery. Both vessels are smooth and diffusely patent to the dura. No proximal subclavian atherosclerosis. Skeleton: Degenerative changes without acute or aggressive finding. Other neck: Small area of stranding and subcutaneous gas in the right low posterior triangle, question attempted vascular access. Upper chest: Paraseptal emphysema, mild. Review of the MIP images confirms the above findings CTA HEAD FINDINGS Anterior circulation: Atherosclerotic plaque on both carotid siphons. No flow limiting stenosis no major branch occlusion. Negative for aneurysm. Posterior circulation: Strong left vertebral artery dominance. The vertebral and basilar arteries are smooth and diffusely patent. Symmetric flow in bilateral PCAs. Hypoplastic left more than right P1 segments. Venous sinuses: Negative Anatomic variants: As above Delayed phase: No abnormal intracranial enhancement Review of the MIP images confirms the above findings IMPRESSION: 1. No acute finding. 2. Atherosclerosis without flow limiting stenosis or ulceration in the head or neck. 3. Extensive chronic small vessel ischemia in the cerebral white matter. 4. Small area of stranding and gas at the right neck base, question attempted vascular access. Electronically Signed   By: JMonte FantasiaM.D.   On: 03/26/2017 11:46   Dg Chest 2 View  Result Date: 03/26/2017 CLINICAL DATA:  Hypoglycemia. EXAM: CHEST  2 VIEW COMPARISON:  06/08/2016 FINDINGS: Cardiac silhouette is normal in size. No mediastinal or hilar masses. No evidence of adenopathy. Clear lungs.  No pleural effusion or pneumothorax. Skeletal structures are intact. IMPRESSION: No active cardiopulmonary disease. Electronically Signed   By: DLajean ManesM.D.   On: 03/26/2017 13:41   Ct Head Wo Contrast  Result Date: 03/26/2017 CLINICAL DATA:  Stuttering. Stroke follow-up. History  of diabetes with CBG of 15. EXAM: CT ANGIOGRAPHY HEAD AND NECK TECHNIQUE: Multidetector CT imaging of the head and neck was performed using the standard protocol during bolus administration of intravenous contrast. Multiplanar CT image reconstructions and MIPs were obtained to evaluate the vascular anatomy. Carotid stenosis measurements (when applicable) are obtained utilizing  NASCET criteria, using the distal internal carotid diameter as the denominator. CONTRAST:  181m ISOVUE-370 IOPAMIDOL (ISOVUE-370) INJECTION 76% COMPARISON:  Brain MRI 06/12/2016 FINDINGS: CT HEAD FINDINGS Brain: Moderate to advanced low-density in the cerebral white matter compatible with chronic small vessel ischemia. No acute hemorrhage, hydrocephalus, or masslike finding. Vascular: See below Skull: Negative Sinuses: Negative Orbits: Negative Review of the MIP images confirms the above findings CTA NECK FINDINGS Aortic arch: Mild atherosclerotic plaque. Left vertebral artery arises from the arch. Right carotid system: Atherosclerotic plaque along the common carotid artery. No flow limiting stenosis, ulceration, or beading. Left carotid system: Mild atherosclerotic plaque at the common carotid bifurcation. No flow limiting stenosis, ulceration, or beading. Vertebral arteries: Left dominant vertebral artery. Both vessels are smooth and diffusely patent to the dura. No proximal subclavian atherosclerosis. Skeleton: Degenerative changes without acute or aggressive finding. Other neck: Small area of stranding and subcutaneous gas in the right low posterior triangle, question attempted vascular access. Upper chest: Paraseptal emphysema, mild. Review of the MIP images confirms the above findings CTA HEAD FINDINGS Anterior circulation: Atherosclerotic plaque on both carotid siphons. No flow limiting stenosis no major branch occlusion. Negative for aneurysm. Posterior circulation: Strong left vertebral artery dominance. The vertebral and basilar  arteries are smooth and diffusely patent. Symmetric flow in bilateral PCAs. Hypoplastic left more than right P1 segments. Venous sinuses: Negative Anatomic variants: As above Delayed phase: No abnormal intracranial enhancement Review of the MIP images confirms the above findings IMPRESSION: 1. No acute finding. 2. Atherosclerosis without flow limiting stenosis or ulceration in the head or neck. 3. Extensive chronic small vessel ischemia in the cerebral white matter. 4. Small area of stranding and gas at the right neck base, question attempted vascular access. Electronically Signed   By: JMonte FantasiaM.D.   On: 03/26/2017 11:46   Ct Angio Neck W And/or Wo Contrast  Result Date: 03/26/2017 CLINICAL DATA:  Stuttering. Stroke follow-up. History of diabetes with CBG of 15. EXAM: CT ANGIOGRAPHY HEAD AND NECK TECHNIQUE: Multidetector CT imaging of the head and neck was performed using the standard protocol during bolus administration of intravenous contrast. Multiplanar CT image reconstructions and MIPs were obtained to evaluate the vascular anatomy. Carotid stenosis measurements (when applicable) are obtained utilizing NASCET criteria, using the distal internal carotid diameter as the denominator. CONTRAST:  1035mISOVUE-370 IOPAMIDOL (ISOVUE-370) INJECTION 76% COMPARISON:  Brain MRI 06/12/2016 FINDINGS: CT HEAD FINDINGS Brain: Moderate to advanced low-density in the cerebral white matter compatible with chronic small vessel ischemia. No acute hemorrhage, hydrocephalus, or masslike finding. Vascular: See below Skull: Negative Sinuses: Negative Orbits: Negative Review of the MIP images confirms the above findings CTA NECK FINDINGS Aortic arch: Mild atherosclerotic plaque. Left vertebral artery arises from the arch. Right carotid system: Atherosclerotic plaque along the common carotid artery. No flow limiting stenosis, ulceration, or beading. Left carotid system: Mild atherosclerotic plaque at the common carotid  bifurcation. No flow limiting stenosis, ulceration, or beading. Vertebral arteries: Left dominant vertebral artery. Both vessels are smooth and diffusely patent to the dura. No proximal subclavian atherosclerosis. Skeleton: Degenerative changes without acute or aggressive finding. Other neck: Small area of stranding and subcutaneous gas in the right low posterior triangle, question attempted vascular access. Upper chest: Paraseptal emphysema, mild. Review of the MIP images confirms the above findings CTA HEAD FINDINGS Anterior circulation: Atherosclerotic plaque on both carotid siphons. No flow limiting stenosis no major branch occlusion. Negative for aneurysm. Posterior circulation: Strong left vertebral artery dominance. The vertebral and  basilar arteries are smooth and diffusely patent. Symmetric flow in bilateral PCAs. Hypoplastic left more than right P1 segments. Venous sinuses: Negative Anatomic variants: As above Delayed phase: No abnormal intracranial enhancement Review of the MIP images confirms the above findings IMPRESSION: 1. No acute finding. 2. Atherosclerosis without flow limiting stenosis or ulceration in the head or neck. 3. Extensive chronic small vessel ischemia in the cerebral white matter. 4. Small area of stranding and gas at the right neck base, question attempted vascular access. Electronically Signed   By: Monte Fantasia M.D.   On: 03/26/2017 11:46    Old chart reviewed Case discussed with EDP Twelve-lead EKG normal sinus rhythm without any acute changes  Assessment/Plan 62 year old male with metabolic encephalopathy secondary to profound hypoglycemia with also new stuttering  Principal Problem:   Hypoglycemia-continue D5 drip.  Check hourly glucose until normalizes for several hours.  He is eating a sandwich right now.  Placed on a diabetic cardiac diet.  Hold all insulin products.  Active Problems:   Acute metabolic encephalopathy-this is resolved and due to severity of his  hypoglycemia.  Obtain frequent neurological checks overnight.    Type 2 diabetes mellitus with peripheral neuropathy (HCC)-holding all diabetic medications including his insulin products secondary to the above    HTN (hypertension)-stable    Chronic alcoholic pancreatitis (HCC)-stable continue his multivitamin folic acid and thiamine    Bipolar disorder (HCC)-stable continue his Abilify    Wernicke's encephalopathy-noted  New stuttering-neurology called at [facility] Dr. Leonel Ramsay advised patient can stay at [facility] obtain MRI and EEG  Lactic acidosis-no signs or source of infection.  Likely due to being down for an unknown amount of time.  IV fluids and serial lactic acid until resolved.  If patient spikes a fever consider infectious source.    DVT prophylaxis: SCDs Code Status: Full Family Communication: None Disposition Plan: Per day team Consults called: None Admission status: Observation   Yemaya Barnier A MD [facility]  If 7PM-7AM, please contact night-coverage [email or web address] Password [facility]  03/26/2017, 3:14 PM

## 2017-03-26 NOTE — ED Notes (Signed)
Patient currently in CT,will check blood sugar once pt returns

## 2017-03-27 ENCOUNTER — Inpatient Hospital Stay (HOSPITAL_COMMUNITY)
Admit: 2017-03-27 | Discharge: 2017-03-27 | Disposition: A | Discharge status: Home / Self Care | Payer: 59 | Attending: Family Medicine | Admitting: Family Medicine

## 2017-03-27 DIAGNOSIS — R740 Nonspecific elevation of levels of transaminase and lactic acid dehydrogenase [LDH]: Secondary | ICD-10-CM | POA: Diagnosis present

## 2017-03-27 DIAGNOSIS — F17210 Nicotine dependence, cigarettes, uncomplicated: Secondary | ICD-10-CM | POA: Diagnosis present

## 2017-03-27 DIAGNOSIS — E162 Hypoglycemia, unspecified: Secondary | ICD-10-CM

## 2017-03-27 DIAGNOSIS — Z9181 History of falling: Secondary | ICD-10-CM | POA: Diagnosis not present

## 2017-03-27 DIAGNOSIS — E785 Hyperlipidemia, unspecified: Secondary | ICD-10-CM | POA: Diagnosis present

## 2017-03-27 DIAGNOSIS — Z794 Long term (current) use of insulin: Secondary | ICD-10-CM | POA: Diagnosis not present

## 2017-03-27 DIAGNOSIS — R4182 Altered mental status, unspecified: Secondary | ICD-10-CM

## 2017-03-27 DIAGNOSIS — R55 Syncope and collapse: Secondary | ICD-10-CM | POA: Diagnosis not present

## 2017-03-27 DIAGNOSIS — R4701 Aphasia: Secondary | ICD-10-CM | POA: Diagnosis present

## 2017-03-27 DIAGNOSIS — F319 Bipolar disorder, unspecified: Secondary | ICD-10-CM | POA: Diagnosis present

## 2017-03-27 DIAGNOSIS — K860 Alcohol-induced chronic pancreatitis: Secondary | ICD-10-CM | POA: Diagnosis present

## 2017-03-27 DIAGNOSIS — K219 Gastro-esophageal reflux disease without esophagitis: Secondary | ICD-10-CM | POA: Diagnosis present

## 2017-03-27 DIAGNOSIS — E11649 Type 2 diabetes mellitus with hypoglycemia without coma: Secondary | ICD-10-CM | POA: Diagnosis present

## 2017-03-27 DIAGNOSIS — F1020 Alcohol dependence, uncomplicated: Secondary | ICD-10-CM | POA: Diagnosis present

## 2017-03-27 DIAGNOSIS — F209 Schizophrenia, unspecified: Secondary | ICD-10-CM | POA: Diagnosis present

## 2017-03-27 DIAGNOSIS — E1151 Type 2 diabetes mellitus with diabetic peripheral angiopathy without gangrene: Secondary | ICD-10-CM | POA: Diagnosis present

## 2017-03-27 DIAGNOSIS — G9341 Metabolic encephalopathy: Secondary | ICD-10-CM | POA: Diagnosis present

## 2017-03-27 DIAGNOSIS — F419 Anxiety disorder, unspecified: Secondary | ICD-10-CM | POA: Diagnosis present

## 2017-03-27 DIAGNOSIS — E440 Moderate protein-calorie malnutrition: Secondary | ICD-10-CM | POA: Diagnosis present

## 2017-03-27 DIAGNOSIS — E1142 Type 2 diabetes mellitus with diabetic polyneuropathy: Secondary | ICD-10-CM | POA: Diagnosis present

## 2017-03-27 DIAGNOSIS — Z79899 Other long term (current) drug therapy: Secondary | ICD-10-CM | POA: Diagnosis not present

## 2017-03-27 DIAGNOSIS — F1290 Cannabis use, unspecified, uncomplicated: Secondary | ICD-10-CM | POA: Diagnosis present

## 2017-03-27 DIAGNOSIS — Z8249 Family history of ischemic heart disease and other diseases of the circulatory system: Secondary | ICD-10-CM | POA: Diagnosis not present

## 2017-03-27 DIAGNOSIS — Z833 Family history of diabetes mellitus: Secondary | ICD-10-CM | POA: Diagnosis not present

## 2017-03-27 DIAGNOSIS — E512 Wernicke's encephalopathy: Secondary | ICD-10-CM | POA: Diagnosis present

## 2017-03-27 DIAGNOSIS — E872 Acidosis: Secondary | ICD-10-CM | POA: Diagnosis present

## 2017-03-27 DIAGNOSIS — I10 Essential (primary) hypertension: Secondary | ICD-10-CM | POA: Diagnosis present

## 2017-03-27 LAB — GLUCOSE, CAPILLARY
GLUCOSE-CAPILLARY: 205 mg/dL — AB (ref 65–99)
GLUCOSE-CAPILLARY: 193 mg/dL — AB (ref 65–99)
GLUCOSE-CAPILLARY: 267 mg/dL — AB (ref 65–99)
GLUCOSE-CAPILLARY: 37 mg/dL — AB (ref 65–99)
GLUCOSE-CAPILLARY: 148 mg/dL — AB (ref 65–99)
GLUCOSE-CAPILLARY: 321 mg/dL — AB (ref 65–99)
GLUCOSE-CAPILLARY: 64 mg/dL — AB (ref 65–99)
GLUCOSE-CAPILLARY: 14 mg/dL — AB (ref 65–99)
Glucose-Capillary: 106 mg/dL — ABNORMAL HIGH (ref 65–99)
Glucose-Capillary: 213 mg/dL — ABNORMAL HIGH (ref 65–99)
Glucose-Capillary: 214 mg/dL — ABNORMAL HIGH (ref 65–99)
Glucose-Capillary: 215 mg/dL — ABNORMAL HIGH (ref 65–99)
Glucose-Capillary: 53 mg/dL — ABNORMAL LOW (ref 65–99)

## 2017-03-27 LAB — BASIC METABOLIC PANEL
ANION GAP: 10 (ref 5–15)
BUN: 10 mg/dL (ref 6–20)
CALCIUM: 7.9 mg/dL — AB (ref 8.9–10.3)
CO2: 23 mmol/L (ref 22–32)
CREATININE: 0.6 mg/dL — AB (ref 0.61–1.24)
Chloride: 105 mmol/L (ref 101–111)
GFR calc Af Amer: >60 mL/min (ref >60)
GFR calc non Af Amer: >60 mL/min (ref >60)
GLUCOSE: 204 mg/dL — AB (ref 65–99)
Potassium: 3.5 mmol/L (ref 3.5–5.1)
Sodium: 138 mmol/L (ref 135–145)

## 2017-03-27 LAB — CBC
HCT: 41.5 % (ref 39.0–52.0)
Hemoglobin: 14.4 g/dL (ref 13.0–17.0)
MCH: 33.4 pg (ref 26.0–34.0)
MCHC: 34.7 g/dL (ref 30.0–36.0)
MCV: 96.3 fL (ref 78.0–100.0)
PLATELETS: 185 10*3/uL (ref 150–400)
RBC: 4.31 MIL/uL (ref 4.22–5.81)
RDW: 14.2 % (ref 11.5–15.5)
WBC: 10.1 10*3/uL (ref 4.0–10.5)

## 2017-03-27 LAB — URINE CULTURE: Culture: NO GROWTH

## 2017-03-27 LAB — HEMOGLOBIN A1C
HEMOGLOBIN A1C: 11.1 % — AB (ref 4.8–5.6)
Mean Plasma Glucose: 271.87 mg/dL

## 2017-03-27 LAB — MRSA PCR SCREENING: MRSA BY PCR: NEGATIVE

## 2017-03-27 MED ORDER — ADULT MULTIVITAMIN W/MINERALS CH: 1.0000 | ORAL_TABLET | Freq: Every day | ORAL | Status: DC

## 2017-03-27 MED ORDER — DEXTROSE 50 % IV SOLN
INTRAVENOUS | Status: AC
  Filled 2017-03-27: qty 50

## 2017-03-27 MED ORDER — SODIUM CHLORIDE 0.9 % IV SOLN
INTRAVENOUS | Status: DC
  Administered 2017-03-27: 100 mL/h via INTRAVENOUS
  Administered 2017-03-27 – 2017-03-28 (×2): via INTRAVENOUS

## 2017-03-27 MED ORDER — FOLIC ACID 1 MG PO TABS: 1.0000 mg | ORAL_TABLET | Freq: Every day | ORAL | Status: DC

## 2017-03-27 MED ORDER — VITAMIN B-1 100 MG PO TABS: 100.0000 mg | ORAL_TABLET | Freq: Every day | ORAL | Status: DC

## 2017-03-27 MED ORDER — THIAMINE HCL 100 MG/ML IJ SOLN: 100.0000 mg | Freq: Every day | INTRAMUSCULAR | Status: DC

## 2017-03-27 MED ORDER — GLUCOSE 4 G PO CHEW
CHEWABLE_TABLET | ORAL | Status: AC
  Administered 2017-03-27: 3
  Filled 2017-03-27: qty 1

## 2017-03-27 MED ORDER — LORAZEPAM 1 MG PO TABS: 1.0000 mg | ORAL_TABLET | Freq: Four times a day (QID) | ORAL | Status: AC | PRN

## 2017-03-27 MED ORDER — INSULIN ASPART 100 UNIT/ML ~~LOC~~ SOLN
0.0000 [IU] | Freq: Three times a day (TID) | SUBCUTANEOUS | Status: DC
Start: 2017-03-27 — End: 2017-03-28
  Administered 2017-03-27: 7 [IU] via SUBCUTANEOUS
  Administered 2017-03-27: 3 [IU] via SUBCUTANEOUS

## 2017-03-27 MED ORDER — PANCRELIPASE (LIP-PROT-AMYL) 12000-38000 UNITS PO CPEP
24000.0000 [IU] | ORAL_CAPSULE | Freq: Three times a day (TID) | ORAL | Status: DC
  Administered 2017-03-27 – 2017-03-30 (×11): 24000 [IU] via ORAL
  Filled 2017-03-27 (×12): qty 2

## 2017-03-27 MED ORDER — ASPIRIN 81 MG PO CHEW
81.0000 mg | CHEWABLE_TABLET | Freq: Every day | ORAL | Status: DC
  Administered 2017-03-27 – 2017-03-30 (×4): 81 mg via ORAL
  Filled 2017-03-27 (×4): qty 1

## 2017-03-27 MED ORDER — LORAZEPAM 2 MG/ML IJ SOLN
1.0000 mg | Freq: Four times a day (QID) | INTRAMUSCULAR | Status: AC | PRN
  Administered 2017-03-29 – 2017-03-30 (×2): 1 mg via INTRAVENOUS
  Filled 2017-03-27 (×2): qty 1

## 2017-03-27 NOTE — Plan of Care (Signed)
Patients glucose has been stable and high through night.  Appears to be able to transition in morning to coverage.  His neuro assessments have also been normal compared to patients base line encephalopathy.

## 2017-03-27 NOTE — Progress Notes (Signed)
EEG completed, results pending. 

## 2017-03-27 NOTE — Procedures (Signed)
ELECTROENCEPHALOGRAM REPORT  Date of Study: 03/27/2017  Patient's Name: Michael [name] MRN: [id number] Date of Birth: 1955-08-02  Referring Provider: Dr. Derrill Kay  Clinical History: This is a 62 year old man found unresponsive  Medications: Neurontin  Technical Summary: A multichannel digital EEG recording measured by the international 10-20 system with electrodes applied with paste and impedances below 5000 ohms performed in our laboratory with EKG monitoring in an awake and asleep patient.  Hyperventilation and photic stimulation were not performed.  The digital EEG was referentially recorded, reformatted, and digitally filtered in a variety of bipolar and referential montages for optimal display.    Description: The patient is awake and asleep during the recording.  During maximal wakefulness, there is a symmetric, medium voltage 9 Hz posterior dominant rhythm that attenuates with eye opening.  The record is symmetric.  During drowsiness and sleep, there is an increase in theta slowing of the background.  Vertex waves and symmetric sleep spindles were seen.  Hyperventilation and photic stimulation were not performed.  There were no epileptiform discharges or electrographic seizures seen.    EKG lead was unremarkable.  Impression: This awake and asleep EEG is normal.    Clinical Correlation: A normal EEG does not exclude a clinical diagnosis of epilepsy. Clinical correlation is advised.   Ellouise Newer, M.D.

## 2017-03-27 NOTE — Progress Notes (Signed)
[facility] PROGRESS NOTE  Michael [name] SAY:[id number] DOB: 10-02-1955 DOA: 03/26/2017 PCP: Helane Rima, MD  Brief summary   62 y.o. male with medical history significant of diabetes, alcohol abuse, bipolar disorder, seizures, alcoholism, h/o wernicke's, tobacco use,  was found down this morning by family members EMS was called and his sugar was 14.  Patient was unresponsive at the time.  Since then he has been giving glucose and his mental status has returned to normal.  Patient is however reporting some stuttering that is a new problem for him.  He does not recall what happened last night.  He does recall not having any recent illnesses though prior to last night.  He denies any nausea vomiting or diarrhea.  Patient is hungry and is up in his bed about to eat.  He denies any numbness tingling or weakness anywhere.  The stuttering is new and is not improved with resolution of his hypoglycemia.  Patient is referred for admission for his severe hypoglycemia along with new stuttering.    Assessment/Plan:  Hypoglycemia, suspect insulin induced, poor oral intake, alcohol use. He has been drinking alcohol for several days. Patient reports taking insulin scheduled, at times without checking his glucose.  -hypoglycemia->resolved.   DM. Episode of hypoglycemia as above. Will restart ISS. Check ha1c. Monitor and restart lantus   Episode of unresponsiveness, acute encephalopathy, stuttering speech. Possible due to hypoglycemia, questionable alcoholic intoxication (no alcohol level was checked on admission), no reported seizures, no incontinence. Neuro exam is non focal, except right medial rectus palsy possible previous wernicke's. MRI brain: no acute infarcts. Mental status is back to baseline. I have d/w Dr. Leonel Ramsay who did not recommend neurology consultation at this time, no antiepileptics.   Alcoholism. Will monitor on ciwa for today.   Lactic acidosis in the setting of hypoglycemia,  alcohol  Use. Resolved with iv fluids. Abdominal exam is benign.   H/o seizure. Per patient: last seizure two years ago. Unclear if etoh withdrawal seizure. He denies taking any antiepileptics at home. No reported seizure at home this time   Code Status: full Family Communication: d/w patient, RN (indicate person spoken with, relationship, and if by phone, the number) Disposition Plan: home 24-48 hrs    Consultants:  Phone consult  Procedures:  EEG  Antibiotics:  none (indicate start date, and stop date if known)  HPI/Subjective: Alert. No dsitress  Objective: Vitals:   03/27/17 0302 03/27/17 0400  BP:  126/77  Pulse:  78  Resp:    Temp: 98.6 F (37 C)   SpO2:  100%    Intake/Output Summary (Last 24 hours) at 03/27/2017 0819 Last data filed at 03/27/2017 0600 Gross per 24 hour  Intake 4638.33 ml  Output 1531 ml  Net 3107.33 ml   Filed Weights   03/26/17 2148 03/27/17 0406  Weight: 63 kg (138 lb 14.2 oz) 63 kg (138 lb 14.2 oz)    Exam:   General:  No distress   Cardiovascular: s1,s2 rrr  Respiratory: CTA BL  Abdomen: soft, nt, nd   Musculoskeletal: no leg edema    Data Reviewed: Basic Metabolic Panel: Recent Labs  Lab 03/26/17 0939 03/26/17 0958 03/27/17 0317  NA 145 145 138  K 3.8 3.8 3.5  CL 106 104 105  CO2 26  --  23  GLUCOSE 62* 56* 204*  BUN 15 16 10   CREATININE 0.68 0.90 0.60*  CALCIUM 8.0*  --  7.9*   Liver Function Tests: Recent Labs  Lab  03/26/17 0939  AST 177*  ALT 148*  ALKPHOS 309*  BILITOT 0.8  PROT 6.9  ALBUMIN 4.0   No results for input(s): LIPASE, AMYLASE in the last 168 hours. No results for input(s): AMMONIA in the last 168 hours. CBC: Recent Labs  Lab 03/26/17 0939 03/26/17 0958 03/27/17 0317  WBC 8.9  --  10.1  NEUTROABS 7.4  --   --   HGB 16.1 16.7 14.4  HCT 45.7 49.0 41.5  MCV 96.4  --  96.3  PLT 233  --  185   Cardiac Enzymes: No results for input(s): CKTOTAL, CKMB, CKMBINDEX, TROPONINI in the  last 168 hours. BNP (last 3 results) No results for input(s): BNP in the last 8760 hours.  ProBNP (last 3 results) No results for input(s): PROBNP in the last 8760 hours.  CBG: Recent Labs  Lab 03/27/17 0035 03/27/17 0111 03/27/17 0234 03/27/17 0317 03/27/17 0554  GLUCAP 215* 205* 214* 193* 267*    Recent Results (from the past 240 hour(s))  MRSA PCR Screening     Status: None   Collection Time: 03/26/17 10:00 PM  Result Value Ref Range Status   MRSA by PCR NEGATIVE NEGATIVE Final    Comment:        The GeneXpert MRSA Assay (FDA approved for NASAL specimens only), is one component of a comprehensive MRSA colonization surveillance program. It is not intended to diagnose MRSA infection nor to guide or monitor treatment for MRSA infections. Performed at [facility], [place] [address] [place], [place] [address]      Studies: Ct Angio Head W Or Wo Contrast  Result Date: 03/26/2017 CLINICAL DATA:  Stuttering. Stroke follow-up. History of diabetes with CBG of 15. EXAM: CT ANGIOGRAPHY HEAD AND NECK TECHNIQUE: Multidetector CT imaging of the head and neck was performed using the standard protocol during bolus administration of intravenous contrast. Multiplanar CT image reconstructions and MIPs were obtained to evaluate the vascular anatomy. Carotid stenosis measurements (when applicable) are obtained utilizing NASCET criteria, using the distal internal carotid diameter as the denominator. CONTRAST:  156mL ISOVUE-370 IOPAMIDOL (ISOVUE-370) INJECTION 76% COMPARISON:  Brain MRI 06/12/2016 FINDINGS: CT HEAD FINDINGS Brain: Moderate to advanced low-density in the cerebral white matter compatible with chronic small vessel ischemia. No acute hemorrhage, hydrocephalus, or masslike finding. Vascular: See below Skull: Negative Sinuses: Negative Orbits: Negative Review of the MIP images confirms the above findings CTA NECK FINDINGS Aortic arch: Mild atherosclerotic plaque. Left  vertebral artery arises from the arch. Right carotid system: Atherosclerotic plaque along the common carotid artery. No flow limiting stenosis, ulceration, or beading. Left carotid system: Mild atherosclerotic plaque at the common carotid bifurcation. No flow limiting stenosis, ulceration, or beading. Vertebral arteries: Left dominant vertebral artery. Both vessels are smooth and diffusely patent to the dura. No proximal subclavian atherosclerosis. Skeleton: Degenerative changes without acute or aggressive finding. Other neck: Small area of stranding and subcutaneous gas in the right low posterior triangle, question attempted vascular access. Upper chest: Paraseptal emphysema, mild. Review of the MIP images confirms the above findings CTA HEAD FINDINGS Anterior circulation: Atherosclerotic plaque on both carotid siphons. No flow limiting stenosis no major branch occlusion. Negative for aneurysm. Posterior circulation: Strong left vertebral artery dominance. The vertebral and basilar arteries are smooth and diffusely patent. Symmetric flow in bilateral PCAs. Hypoplastic left more than right P1 segments. Venous sinuses: Negative Anatomic variants: As above Delayed phase: No abnormal intracranial enhancement Review of the MIP images confirms the above findings IMPRESSION: 1.  No acute finding. 2. Atherosclerosis without flow limiting stenosis or ulceration in the head or neck. 3. Extensive chronic small vessel ischemia in the cerebral white matter. 4. Small area of stranding and gas at the right neck base, question attempted vascular access. Electronically Signed   By: Monte Fantasia M.D.   On: 03/26/2017 11:46   Dg Chest 2 View  Result Date: 03/26/2017 CLINICAL DATA:  Hypoglycemia. EXAM: CHEST  2 VIEW COMPARISON:  06/08/2016 FINDINGS: Cardiac silhouette is normal in size. No mediastinal or hilar masses. No evidence of adenopathy. Clear lungs.  No pleural effusion or pneumothorax. Skeletal structures are intact.  IMPRESSION: No active cardiopulmonary disease. Electronically Signed   By: Lajean Manes M.D.   On: 03/26/2017 13:41   Ct Head Wo Contrast  Result Date: 03/26/2017 CLINICAL DATA:  Stuttering. Stroke follow-up. History of diabetes with CBG of 15. EXAM: CT ANGIOGRAPHY HEAD AND NECK TECHNIQUE: Multidetector CT imaging of the head and neck was performed using the standard protocol during bolus administration of intravenous contrast. Multiplanar CT image reconstructions and MIPs were obtained to evaluate the vascular anatomy. Carotid stenosis measurements (when applicable) are obtained utilizing NASCET criteria, using the distal internal carotid diameter as the denominator. CONTRAST:  150mL ISOVUE-370 IOPAMIDOL (ISOVUE-370) INJECTION 76% COMPARISON:  Brain MRI 06/12/2016 FINDINGS: CT HEAD FINDINGS Brain: Moderate to advanced low-density in the cerebral white matter compatible with chronic small vessel ischemia. No acute hemorrhage, hydrocephalus, or masslike finding. Vascular: See below Skull: Negative Sinuses: Negative Orbits: Negative Review of the MIP images confirms the above findings CTA NECK FINDINGS Aortic arch: Mild atherosclerotic plaque. Left vertebral artery arises from the arch. Right carotid system: Atherosclerotic plaque along the common carotid artery. No flow limiting stenosis, ulceration, or beading. Left carotid system: Mild atherosclerotic plaque at the common carotid bifurcation. No flow limiting stenosis, ulceration, or beading. Vertebral arteries: Left dominant vertebral artery. Both vessels are smooth and diffusely patent to the dura. No proximal subclavian atherosclerosis. Skeleton: Degenerative changes without acute or aggressive finding. Other neck: Small area of stranding and subcutaneous gas in the right low posterior triangle, question attempted vascular access. Upper chest: Paraseptal emphysema, mild. Review of the MIP images confirms the above findings CTA HEAD FINDINGS Anterior  circulation: Atherosclerotic plaque on both carotid siphons. No flow limiting stenosis no major branch occlusion. Negative for aneurysm. Posterior circulation: Strong left vertebral artery dominance. The vertebral and basilar arteries are smooth and diffusely patent. Symmetric flow in bilateral PCAs. Hypoplastic left more than right P1 segments. Venous sinuses: Negative Anatomic variants: As above Delayed phase: No abnormal intracranial enhancement Review of the MIP images confirms the above findings IMPRESSION: 1. No acute finding. 2. Atherosclerosis without flow limiting stenosis or ulceration in the head or neck. 3. Extensive chronic small vessel ischemia in the cerebral white matter. 4. Small area of stranding and gas at the right neck base, question attempted vascular access. Electronically Signed   By: Monte Fantasia M.D.   On: 03/26/2017 11:46   Ct Angio Neck W And/or Wo Contrast  Result Date: 03/26/2017 CLINICAL DATA:  Stuttering. Stroke follow-up. History of diabetes with CBG of 15. EXAM: CT ANGIOGRAPHY HEAD AND NECK TECHNIQUE: Multidetector CT imaging of the head and neck was performed using the standard protocol during bolus administration of intravenous contrast. Multiplanar CT image reconstructions and MIPs were obtained to evaluate the vascular anatomy. Carotid stenosis measurements (when applicable) are obtained utilizing NASCET criteria, using the distal internal carotid diameter as the denominator. CONTRAST:  164mL ISOVUE-370  IOPAMIDOL (ISOVUE-370) INJECTION 76% COMPARISON:  Brain MRI 06/12/2016 FINDINGS: CT HEAD FINDINGS Brain: Moderate to advanced low-density in the cerebral white matter compatible with chronic small vessel ischemia. No acute hemorrhage, hydrocephalus, or masslike finding. Vascular: See below Skull: Negative Sinuses: Negative Orbits: Negative Review of the MIP images confirms the above findings CTA NECK FINDINGS Aortic arch: Mild atherosclerotic plaque. Left vertebral artery  arises from the arch. Right carotid system: Atherosclerotic plaque along the common carotid artery. No flow limiting stenosis, ulceration, or beading. Left carotid system: Mild atherosclerotic plaque at the common carotid bifurcation. No flow limiting stenosis, ulceration, or beading. Vertebral arteries: Left dominant vertebral artery. Both vessels are smooth and diffusely patent to the dura. No proximal subclavian atherosclerosis. Skeleton: Degenerative changes without acute or aggressive finding. Other neck: Small area of stranding and subcutaneous gas in the right low posterior triangle, question attempted vascular access. Upper chest: Paraseptal emphysema, mild. Review of the MIP images confirms the above findings CTA HEAD FINDINGS Anterior circulation: Atherosclerotic plaque on both carotid siphons. No flow limiting stenosis no major branch occlusion. Negative for aneurysm. Posterior circulation: Strong left vertebral artery dominance. The vertebral and basilar arteries are smooth and diffusely patent. Symmetric flow in bilateral PCAs. Hypoplastic left more than right P1 segments. Venous sinuses: Negative Anatomic variants: As above Delayed phase: No abnormal intracranial enhancement Review of the MIP images confirms the above findings IMPRESSION: 1. No acute finding. 2. Atherosclerosis without flow limiting stenosis or ulceration in the head or neck. 3. Extensive chronic small vessel ischemia in the cerebral white matter. 4. Small area of stranding and gas at the right neck base, question attempted vascular access. Electronically Signed   By: Monte Fantasia M.D.   On: 03/26/2017 11:46   Mr Brain Wo Contrast  Result Date: 03/26/2017 CLINICAL DATA:  Altered level of consciousness, hypoglycemia. History of bipolar disorder, diabetes and seizures. EXAM: MRI HEAD WITHOUT CONTRAST TECHNIQUE: Multiplanar, multiecho pulse sequences of the brain and surrounding structures were obtained without intravenous contrast.  COMPARISON:  CT HEAD March 26, 2017 and MRI of the head Jun 12, 2016 FINDINGS: Multiple sequences are moderately motion degraded. INTRACRANIAL CONTENTS: No reduced diffusion to suggest acute ischemia. No susceptibility artifact to suggest hemorrhage. Mild parenchymal brain volume loss for age. No hydrocephalus. Patchy to confluent supratentorial white matter FLAIR T2 hyperintensities. Old bilateral thalamus lacunar infarcts. No suspicious parenchymal signal, masses, mass effect. No abnormal extra-axial fluid collections. No extra-axial masses. VASCULAR: Normal major intracranial vascular flow voids present at skull base. SKULL AND UPPER CERVICAL SPINE: No abnormal sellar expansion. No suspicious calvarial bone marrow signal. Craniocervical junction maintained. SINUSES/ORBITS: Mild paranasal sinus mucosal thickening. Small bilateral mastoid effusions.The included ocular globes and orbital contents are non-suspicious. OTHER: Patient is edentulous. IMPRESSION: 1. No acute intracranial process on this motion degraded examination. 2. Moderate to severe chronic small vessel ischemic disease and old bilateral thalamus lacunar infarcts. 3. Mild parenchymal brain volume loss. Electronically Signed   By: Elon Alas M.D.   On: 03/26/2017 19:11    Scheduled Meds: . ARIPiprazole  5 mg Oral Daily  . DULoxetine  60 mg Oral Daily  . folic acid  1 mg Oral Daily  . gabapentin  1,200 mg Oral QHS  . gabapentin  900 mg Oral BID  . insulin aspart  0-9 Units Subcutaneous TID WC  . multivitamin with minerals  1 tablet Oral Daily  . pantoprazole  40 mg Oral Daily  . thiamine  100 mg Oral Daily  Continuous Infusions:  Principal Problem:   Hypoglycemia Active Problems:   Type 2 diabetes mellitus with peripheral neuropathy (HCC)   HTN (hypertension)   Chronic alcoholic pancreatitis ([place])   Bipolar disorder (HCC)   Wernicke's encephalopathy   Acute metabolic encephalopathy    Time spent: >35 minutes      Kinnie Feil  [facility] Pager [phone]. If 7PM-7AM, please contact night-coverage at [email or web address], password [facility] 03/27/2017, 8:19 AM  LOS: 0 days

## 2017-03-28 LAB — GLUCOSE, CAPILLARY
GLUCOSE-CAPILLARY: 39 mg/dL — AB (ref 65–99)
GLUCOSE-CAPILLARY: 269 mg/dL — AB (ref 65–99)
GLUCOSE-CAPILLARY: 266 mg/dL — AB (ref 65–99)
Glucose-Capillary: 195 mg/dL — ABNORMAL HIGH (ref 65–99)
Glucose-Capillary: 293 mg/dL — ABNORMAL HIGH (ref 65–99)
Glucose-Capillary: 314 mg/dL — ABNORMAL HIGH (ref 65–99)
Glucose-Capillary: 38 mg/dL — CL (ref 65–99)
Glucose-Capillary: 91 mg/dL (ref 65–99)

## 2017-03-28 MED ORDER — INSULIN ASPART 100 UNIT/ML ~~LOC~~ SOLN: 0.0000 [IU] | Freq: Three times a day (TID) | SUBCUTANEOUS | Status: DC

## 2017-03-28 MED ORDER — INSULIN ASPART 100 UNIT/ML ~~LOC~~ SOLN
1.0000 [IU] | Freq: Three times a day (TID) | SUBCUTANEOUS | Status: DC
  Administered 2017-03-29: 3 [IU] via SUBCUTANEOUS

## 2017-03-28 MED ORDER — IBUPROFEN 200 MG PO TABS
400.0000 mg | ORAL_TABLET | Freq: Four times a day (QID) | ORAL | Status: DC | PRN
  Administered 2017-03-29 (×2): 400 mg via ORAL
  Filled 2017-03-28 (×3): qty 2

## 2017-03-28 MED ORDER — INSULIN ASPART 100 UNIT/ML ~~LOC~~ SOLN
1.0000 [IU] | Freq: Three times a day (TID) | SUBCUTANEOUS | Status: DC
  Administered 2017-03-28: 4 [IU] via SUBCUTANEOUS

## 2017-03-28 NOTE — Progress Notes (Addendum)
NT reported blood sugar of 39 around 2140, patient was A&O and asymptomatic. Gave orange juice and graham cracker with peanut butter per protocol. Rechecked at 2206 by NT, blood sugar was 14. Patient was still asymptomatic, administered 3 glucose tablets and gave patient more juice and graham crackers with peanut butter per pt request per protocol. Rechecked CBG at 2232 and it was 106. Patient continued to request food thereafter. Will continue to monitor.

## 2017-03-28 NOTE — Progress Notes (Signed)

## 2017-03-28 NOTE — Progress Notes (Signed)
[facility] PROGRESS NOTE  Michael [name] MPN:[id number] DOB: 1955-07-06 DOA: 03/26/2017 PCP: Helane Rima, MD  Brief summary   62 y.o. male with medical history significant of diabetes, alcohol abuse, bipolar disorder, seizures, alcoholism, h/o wernicke's, tobacco use,  was found down this morning by family members EMS was called and his sugar was 14.  Patient was unresponsive at the time.  Since then he has been giving glucose and his mental status has returned to normal.  Patient is however reporting some stuttering that is a new problem for him.  He does not recall what happened last night.  He does recall not having any recent illnesses though prior to last night.  He denies any nausea vomiting or diarrhea.  Patient is hungry and is up in his bed about to eat.  He denies any numbness tingling or weakness anywhere.  The stuttering is new and is not improved with resolution of his hypoglycemia.  Patient is referred for admission for his severe hypoglycemia along with new stuttering.    Assessment/Plan:  Hypoglycemia, suspect insulin induced, poor oral intake, alcohol use. He has been drinking alcohol for several days. Patient reports taking insulin scheduled, at times without checking his glucose.  -episodes of recurrent hypoglycemia inpatient. Will limit insulin to 1-4 U with ISS> Monitor    DM. Episode of hypoglycemia as above. restart ISS with limit to 1-4 U. [id number]-11.0. Monitor and restart lantus if needed. But likely needs only ISS at discharge   Episode of unresponsiveness, acute encephalopathy, stuttering speech. Resolved. Possible due to hypoglycemia, questionable alcoholic intoxication (no alcohol level was checked on admission), no reported seizures, no incontinence. Neuro exam is non focal, except right medial rectus palsy possible previous wernicke's. MRI brain: no acute infarcts. Mental status is back to baseline. I have d/w Dr. Leonel Ramsay who did not recommend neurology  consultation at this time, no antiepileptics. EEG: unremarkable   Alcoholism. Will monitor on ciwa. No s/s of acute withdrawals. Will d/c in 24 hrs    Lactic acidosis in the setting of hypoglycemia, alcohol  Use. Resolved with iv fluids. Abdominal exam is benign.   H/o seizure. Per patient: last seizure two years ago. Unclear if etoh withdrawal seizure. He denies taking any antiepileptics at home. No reported seizure at home this time   Code Status: full Family Communication: d/w patient, RN (indicate person spoken with, relationship, and if by phone, the number) Disposition Plan: home 24-48 hrs    Consultants:  Phone consult  Procedures:  EEG  Antibiotics:  none (indicate start date, and stop date if known)  HPI/Subjective: Alert. No dsitress  Objective: Vitals:   03/27/17 2127 03/28/17 0411  BP: (!) 107/57 126/70  Pulse: 83 80  Resp: 18 20  Temp: 98.2 F (36.8 C) 98.4 F (36.9 C)  SpO2: 99% 100%    Intake/Output Summary (Last 24 hours) at 03/28/2017 0833 Last data filed at 03/28/2017 0600 Gross per 24 hour  Intake 3810 ml  Output -  Net 3810 ml   Filed Weights   03/26/17 2148 03/27/17 0406 03/28/17 0411  Weight: 63 kg (138 lb 14.2 oz) 63 kg (138 lb 14.2 oz) 63.6 kg (140 lb 3.4 oz)    Exam:   General:  No distress   Cardiovascular: s1,s2 rrr  Respiratory: CTA BL  Abdomen: soft, nt, nd   Musculoskeletal: no leg edema    Data Reviewed: Basic Metabolic Panel: Recent Labs  Lab 03/26/17 0939 03/26/17 0958 03/27/17 0317  NA 145 145  138  K 3.8 3.8 3.5  CL 106 104 105  CO2 26  --  23  GLUCOSE 62* 56* 204*  BUN 15 16 10   CREATININE 0.68 0.90 0.60*  CALCIUM 8.0*  --  7.9*   Liver Function Tests: Recent Labs  Lab 03/26/17 0939  AST 177*  ALT 148*  ALKPHOS 309*  BILITOT 0.8  PROT 6.9  ALBUMIN 4.0   No results for input(s): LIPASE, AMYLASE in the last 168 hours. No results for input(s): AMMONIA in the last 168 hours. CBC: Recent Labs   Lab 03/26/17 0939 03/26/17 0958 03/27/17 0317  WBC 8.9  --  10.1  NEUTROABS 7.4  --   --   HGB 16.1 16.7 14.4  HCT 45.7 49.0 41.5  MCV 96.4  --  96.3  PLT 233  --  185   Cardiac Enzymes: No results for input(s): CKTOTAL, CKMB, CKMBINDEX, TROPONINI in the last 168 hours. BNP (last 3 results) No results for input(s): BNP in the last 8760 hours.  ProBNP (last 3 results) No results for input(s): PROBNP in the last 8760 hours.  CBG: Recent Labs  Lab 03/27/17 2206 03/27/17 2232 03/28/17 0015 03/28/17 0410 03/28/17 0728  GLUCAP 14* 106* 293* 269* 195*    Recent Results (from the past 240 hour(s))  Urine culture     Status: None   Collection Time: 03/26/17  1:11 PM  Result Value Ref Range Status   Specimen Description   Final    URINE, RANDOM Performed at [facility] [address], [place], [place] [address]    Special Requests   Final    NONE Performed at [facility], [place] [address] [place], [place] [address]    Culture   Final    NO GROWTH Performed at [facility], [place] [address], [place], [place] [address]    Report Status 03/27/2017 FINAL  Final  MRSA PCR Screening     Status: None   Collection Time: 03/26/17 10:00 PM  Result Value Ref Range Status   MRSA by PCR NEGATIVE NEGATIVE Final    Comment:        The GeneXpert MRSA Assay (FDA approved for NASAL specimens only), is one component of a comprehensive MRSA colonization surveillance program. It is not intended to diagnose MRSA infection nor to guide or monitor treatment for MRSA infections. Performed at [facility], [place] [address] [place], [place] [address]      Studies: Ct Angio Head W Or Wo Contrast  Result Date: 03/26/2017 CLINICAL DATA:  Stuttering. Stroke follow-up. History of diabetes with CBG of 15. EXAM: CT ANGIOGRAPHY HEAD AND NECK TECHNIQUE: Multidetector CT imaging of the head and neck was performed using the standard  protocol during bolus administration of intravenous contrast. Multiplanar CT image reconstructions and MIPs were obtained to evaluate the vascular anatomy. Carotid stenosis measurements (when applicable) are obtained utilizing NASCET criteria, using the distal internal carotid diameter as the denominator. CONTRAST:  155mL ISOVUE-370 IOPAMIDOL (ISOVUE-370) INJECTION 76% COMPARISON:  Brain MRI 06/12/2016 FINDINGS: CT HEAD FINDINGS Brain: Moderate to advanced low-density in the cerebral white matter compatible with chronic small vessel ischemia. No acute hemorrhage, hydrocephalus, or masslike finding. Vascular: See below Skull: Negative Sinuses: Negative Orbits: Negative Review of the MIP images confirms the above findings CTA NECK FINDINGS Aortic arch: Mild atherosclerotic plaque. Left vertebral artery arises from the arch. Right carotid system: Atherosclerotic plaque along the common carotid artery. No flow limiting stenosis, ulceration,  or beading. Left carotid system: Mild atherosclerotic plaque at the common carotid bifurcation. No flow limiting stenosis, ulceration, or beading. Vertebral arteries: Left dominant vertebral artery. Both vessels are smooth and diffusely patent to the dura. No proximal subclavian atherosclerosis. Skeleton: Degenerative changes without acute or aggressive finding. Other neck: Small area of stranding and subcutaneous gas in the right low posterior triangle, question attempted vascular access. Upper chest: Paraseptal emphysema, mild. Review of the MIP images confirms the above findings CTA HEAD FINDINGS Anterior circulation: Atherosclerotic plaque on both carotid siphons. No flow limiting stenosis no major branch occlusion. Negative for aneurysm. Posterior circulation: Strong left vertebral artery dominance. The vertebral and basilar arteries are smooth and diffusely patent. Symmetric flow in bilateral PCAs. Hypoplastic left more than right P1 segments. Venous sinuses: Negative Anatomic  variants: As above Delayed phase: No abnormal intracranial enhancement Review of the MIP images confirms the above findings IMPRESSION: 1. No acute finding. 2. Atherosclerosis without flow limiting stenosis or ulceration in the head or neck. 3. Extensive chronic small vessel ischemia in the cerebral white matter. 4. Small area of stranding and gas at the right neck base, question attempted vascular access. Electronically Signed   By: Monte Fantasia M.D.   On: 03/26/2017 11:46   Dg Chest 2 View  Result Date: 03/26/2017 CLINICAL DATA:  Hypoglycemia. EXAM: CHEST  2 VIEW COMPARISON:  06/08/2016 FINDINGS: Cardiac silhouette is normal in size. No mediastinal or hilar masses. No evidence of adenopathy. Clear lungs.  No pleural effusion or pneumothorax. Skeletal structures are intact. IMPRESSION: No active cardiopulmonary disease. Electronically Signed   By: Lajean Manes M.D.   On: 03/26/2017 13:41   Ct Head Wo Contrast  Result Date: 03/26/2017 CLINICAL DATA:  Stuttering. Stroke follow-up. History of diabetes with CBG of 15. EXAM: CT ANGIOGRAPHY HEAD AND NECK TECHNIQUE: Multidetector CT imaging of the head and neck was performed using the standard protocol during bolus administration of intravenous contrast. Multiplanar CT image reconstructions and MIPs were obtained to evaluate the vascular anatomy. Carotid stenosis measurements (when applicable) are obtained utilizing NASCET criteria, using the distal internal carotid diameter as the denominator. CONTRAST:  131mL ISOVUE-370 IOPAMIDOL (ISOVUE-370) INJECTION 76% COMPARISON:  Brain MRI 06/12/2016 FINDINGS: CT HEAD FINDINGS Brain: Moderate to advanced low-density in the cerebral white matter compatible with chronic small vessel ischemia. No acute hemorrhage, hydrocephalus, or masslike finding. Vascular: See below Skull: Negative Sinuses: Negative Orbits: Negative Review of the MIP images confirms the above findings CTA NECK FINDINGS Aortic arch: Mild atherosclerotic  plaque. Left vertebral artery arises from the arch. Right carotid system: Atherosclerotic plaque along the common carotid artery. No flow limiting stenosis, ulceration, or beading. Left carotid system: Mild atherosclerotic plaque at the common carotid bifurcation. No flow limiting stenosis, ulceration, or beading. Vertebral arteries: Left dominant vertebral artery. Both vessels are smooth and diffusely patent to the dura. No proximal subclavian atherosclerosis. Skeleton: Degenerative changes without acute or aggressive finding. Other neck: Small area of stranding and subcutaneous gas in the right low posterior triangle, question attempted vascular access. Upper chest: Paraseptal emphysema, mild. Review of the MIP images confirms the above findings CTA HEAD FINDINGS Anterior circulation: Atherosclerotic plaque on both carotid siphons. No flow limiting stenosis no major branch occlusion. Negative for aneurysm. Posterior circulation: Strong left vertebral artery dominance. The vertebral and basilar arteries are smooth and diffusely patent. Symmetric flow in bilateral PCAs. Hypoplastic left more than right P1 segments. Venous sinuses: Negative Anatomic variants: As above Delayed phase: No abnormal intracranial enhancement Review  of the MIP images confirms the above findings IMPRESSION: 1. No acute finding. 2. Atherosclerosis without flow limiting stenosis or ulceration in the head or neck. 3. Extensive chronic small vessel ischemia in the cerebral white matter. 4. Small area of stranding and gas at the right neck base, question attempted vascular access. Electronically Signed   By: Monte Fantasia M.D.   On: 03/26/2017 11:46   Ct Angio Neck W And/or Wo Contrast  Result Date: 03/26/2017 CLINICAL DATA:  Stuttering. Stroke follow-up. History of diabetes with CBG of 15. EXAM: CT ANGIOGRAPHY HEAD AND NECK TECHNIQUE: Multidetector CT imaging of the head and neck was performed using the standard protocol during bolus  administration of intravenous contrast. Multiplanar CT image reconstructions and MIPs were obtained to evaluate the vascular anatomy. Carotid stenosis measurements (when applicable) are obtained utilizing NASCET criteria, using the distal internal carotid diameter as the denominator. CONTRAST:  168mL ISOVUE-370 IOPAMIDOL (ISOVUE-370) INJECTION 76% COMPARISON:  Brain MRI 06/12/2016 FINDINGS: CT HEAD FINDINGS Brain: Moderate to advanced low-density in the cerebral white matter compatible with chronic small vessel ischemia. No acute hemorrhage, hydrocephalus, or masslike finding. Vascular: See below Skull: Negative Sinuses: Negative Orbits: Negative Review of the MIP images confirms the above findings CTA NECK FINDINGS Aortic arch: Mild atherosclerotic plaque. Left vertebral artery arises from the arch. Right carotid system: Atherosclerotic plaque along the common carotid artery. No flow limiting stenosis, ulceration, or beading. Left carotid system: Mild atherosclerotic plaque at the common carotid bifurcation. No flow limiting stenosis, ulceration, or beading. Vertebral arteries: Left dominant vertebral artery. Both vessels are smooth and diffusely patent to the dura. No proximal subclavian atherosclerosis. Skeleton: Degenerative changes without acute or aggressive finding. Other neck: Small area of stranding and subcutaneous gas in the right low posterior triangle, question attempted vascular access. Upper chest: Paraseptal emphysema, mild. Review of the MIP images confirms the above findings CTA HEAD FINDINGS Anterior circulation: Atherosclerotic plaque on both carotid siphons. No flow limiting stenosis no major branch occlusion. Negative for aneurysm. Posterior circulation: Strong left vertebral artery dominance. The vertebral and basilar arteries are smooth and diffusely patent. Symmetric flow in bilateral PCAs. Hypoplastic left more than right P1 segments. Venous sinuses: Negative Anatomic variants: As above  Delayed phase: No abnormal intracranial enhancement Review of the MIP images confirms the above findings IMPRESSION: 1. No acute finding. 2. Atherosclerosis without flow limiting stenosis or ulceration in the head or neck. 3. Extensive chronic small vessel ischemia in the cerebral white matter. 4. Small area of stranding and gas at the right neck base, question attempted vascular access. Electronically Signed   By: Monte Fantasia M.D.   On: 03/26/2017 11:46   Mr Brain Wo Contrast  Result Date: 03/26/2017 CLINICAL DATA:  Altered level of consciousness, hypoglycemia. History of bipolar disorder, diabetes and seizures. EXAM: MRI HEAD WITHOUT CONTRAST TECHNIQUE: Multiplanar, multiecho pulse sequences of the brain and surrounding structures were obtained without intravenous contrast. COMPARISON:  CT HEAD March 26, 2017 and MRI of the head Jun 12, 2016 FINDINGS: Multiple sequences are moderately motion degraded. INTRACRANIAL CONTENTS: No reduced diffusion to suggest acute ischemia. No susceptibility artifact to suggest hemorrhage. Mild parenchymal brain volume loss for age. No hydrocephalus. Patchy to confluent supratentorial white matter FLAIR T2 hyperintensities. Old bilateral thalamus lacunar infarcts. No suspicious parenchymal signal, masses, mass effect. No abnormal extra-axial fluid collections. No extra-axial masses. VASCULAR: Normal major intracranial vascular flow voids present at skull base. SKULL AND UPPER CERVICAL SPINE: No abnormal sellar expansion. No suspicious calvarial bone  marrow signal. Craniocervical junction maintained. SINUSES/ORBITS: Mild paranasal sinus mucosal thickening. Small bilateral mastoid effusions.The included ocular globes and orbital contents are non-suspicious. OTHER: Patient is edentulous. IMPRESSION: 1. No acute intracranial process on this motion degraded examination. 2. Moderate to severe chronic small vessel ischemic disease and old bilateral thalamus lacunar infarcts. 3. Mild  parenchymal brain volume loss. Electronically Signed   By: Elon Alas M.D.   On: 03/26/2017 19:11    Scheduled Meds: . ARIPiprazole  5 mg Oral Daily  . aspirin  81 mg Oral Daily  . DULoxetine  60 mg Oral Daily  . folic acid  1 mg Oral Daily  . gabapentin  1,200 mg Oral QHS  . gabapentin  900 mg Oral BID  . insulin aspart  0-5 Units Subcutaneous TID WC  . lipase/protease/amylase  24,000 Units Oral TID AC  . multivitamin with minerals  1 tablet Oral Daily  . pantoprazole  40 mg Oral Daily  . thiamine  100 mg Intravenous Daily  . thiamine  100 mg Oral Daily   Continuous Infusions: . sodium chloride Stopped (03/28/17 0809)    Principal Problem:   Hypoglycemia Active Problems:   Type 2 diabetes mellitus with peripheral neuropathy (HCC)   HTN (hypertension)   Chronic alcoholic pancreatitis ([place])   Bipolar disorder (HCC)   Wernicke's encephalopathy   Acute metabolic encephalopathy    Time spent: >35 minutes     Kinnie Feil  [facility] Pager [phone]. If 7PM-7AM, please contact night-coverage at [email or web address], password [facility] 03/28/2017, 8:33 AM  LOS: 1 day

## 2017-03-28 NOTE — Progress Notes (Addendum)
Hypoglycemic Event  CBG: 38  Treatment: 4oz Orange Juice  Symptoms: NONE  Follow-up CBG: Time: 1700 CBG Result: 91  Possible Reasons for Event:   Comments/MD notified: Md notified, decreased insulin scale    Michael [name] L

## 2017-03-28 NOTE — Plan of Care (Signed)
  Activity: Risk for activity intolerance will decrease 03/28/2017 0805 - Progressing by Dorene Sorrow, RN   Nutrition: Adequate nutrition will be maintained 03/28/2017 0805 - Progressing by Dorene Sorrow, RN   Elimination: Will not experience complications related to bowel motility 03/28/2017 0805 - Progressing by Dorene Sorrow, RN

## 2017-03-29 ENCOUNTER — Other Ambulatory Visit: Payer: Self-pay | Admitting: Internal Medicine

## 2017-03-29 DIAGNOSIS — E1142 Type 2 diabetes mellitus with diabetic polyneuropathy: Secondary | ICD-10-CM

## 2017-03-29 LAB — COMPREHENSIVE METABOLIC PANEL
ALBUMIN: 3.3 g/dL — AB (ref 3.5–5.0)
ALT: 89 U/L — ABNORMAL HIGH (ref 17–63)
AST: 84 U/L — AB (ref 15–41)
Alkaline Phosphatase: 263 U/L — ABNORMAL HIGH (ref 38–126)
Anion gap: 6 (ref 5–15)
BUN: 8 mg/dL (ref 6–20)
CHLORIDE: 105 mmol/L (ref 101–111)
CO2: 28 mmol/L (ref 22–32)
Calcium: 8.6 mg/dL — ABNORMAL LOW (ref 8.9–10.3)
Creatinine, Ser: 0.69 mg/dL (ref 0.61–1.24)
GFR calc Af Amer: >60 mL/min (ref >60)
Glucose, Bld: 137 mg/dL — ABNORMAL HIGH (ref 65–99)
POTASSIUM: 3.9 mmol/L (ref 3.5–5.1)
Sodium: 139 mmol/L (ref 135–145)
Total Bilirubin: 0.9 mg/dL (ref 0.3–1.2)
Total Protein: 5.7 g/dL — ABNORMAL LOW (ref 6.5–8.1)

## 2017-03-29 LAB — GLUCOSE, CAPILLARY
GLUCOSE-CAPILLARY: 371 mg/dL — AB (ref 65–99)
GLUCOSE-CAPILLARY: 352 mg/dL — AB (ref 65–99)
GLUCOSE-CAPILLARY: 324 mg/dL — AB (ref 65–99)
GLUCOSE-CAPILLARY: 243 mg/dL — AB (ref 65–99)
Glucose-Capillary: 247 mg/dL — ABNORMAL HIGH (ref 65–99)
Glucose-Capillary: 50 mg/dL — ABNORMAL LOW (ref 65–99)
Glucose-Capillary: 75 mg/dL (ref 65–99)

## 2017-03-29 MED ORDER — LIVING WELL WITH DIABETES BOOK
Freq: Once | Status: AC
  Administered 2017-03-29: 17:00:00
  Filled 2017-03-29: qty 1

## 2017-03-29 MED ORDER — INSULIN GLARGINE 100 UNIT/ML ~~LOC~~ SOLN: 8.0000 [IU] | Freq: Every day | SUBCUTANEOUS | Status: DC

## 2017-03-29 MED ORDER — INSULIN ASPART 100 UNIT/ML ~~LOC~~ SOLN
0.0000 [IU] | Freq: Three times a day (TID) | SUBCUTANEOUS | Status: DC
  Administered 2017-03-29: 7 [IU] via SUBCUTANEOUS
  Administered 2017-03-30: 2 [IU] via SUBCUTANEOUS
  Administered 2017-03-30: 5 [IU] via SUBCUTANEOUS

## 2017-03-29 MED ORDER — INSULIN ASPART 100 UNIT/ML ~~LOC~~ SOLN
0.0000 [IU] | Freq: Every day | SUBCUTANEOUS | Status: DC
  Administered 2017-03-29: 2 [IU] via SUBCUTANEOUS

## 2017-03-29 MED ORDER — INSULIN GLARGINE 100 UNIT/ML ~~LOC~~ SOLN
4.0000 [IU] | Freq: Every day | SUBCUTANEOUS | Status: DC
  Administered 2017-03-29: 4 [IU] via SUBCUTANEOUS
  Filled 2017-03-29 (×2): qty 0.04

## 2017-03-29 NOTE — Progress Notes (Signed)
Hypoglycemic Event  CBG: 50  Treatment: 120 ml OJ  Symptoms: asymptomatic  Follow-up CBG: Time: 1204 CBG Result: 75  Possible Reasons for Event:   Comments/MD notified: hypoglycemic protocol followed    Amillion Macchia W Katlyn Muldrew

## 2017-03-29 NOTE — Progress Notes (Signed)
Patient Demographics:    Michael [name], is a 62 y.o. male, DOB - 08/15/1955, SPQ:[id number]  Admit date - 03/26/2017   Admitting Physician Phillips Grout, MD  Outpatient Primary MD for the patient is Helane Rima, MD  LOS - 2   Chief Complaint  Patient presents with  . Hypoglycemia        Subjective:    Michael [name] today has no fevers, no emesis,  No chest pain,  Had hypoglycemic episode  Assessment  & Plan :    Principal Problem:   Hypoglycemia Active Problems:   Type 2 diabetes mellitus with peripheral neuropathy (HCC)   HTN (hypertension)   Chronic alcoholic pancreatitis (HCC)   Bipolar disorder (HCC)   Wernicke's encephalopathy   Acute metabolic encephalopathy  Brief summary   61 y.o.malewith medical history significant ofdiabetes, alcohol abuse, bipolar disorder, seizures, alcoholism, h/o wernicke's, tobacco use,  was found down this morning by family members EMS was called and his sugar was 14. Patient was unresponsive at the time. Since then he has been giving glucose and his mental status has returned to normal. Patient is however reporting some stuttering that is a new problem for him. He does not recall what happened last night. He does recall not having any recent illnesses though prior to last night. He denies any nausea vomiting or diarrhea. Patient is hungry and is up in his bed about to eat. He denies any numbness tingling or weakness anywhere. The stuttering is new and is not improved with resolution of his hypoglycemia. Patient is referred for admission for his severe hypoglycemia along with new stuttering.    Assessment/Plan:  1)Hypoglycemia/DM-  suspect insulin induced, poor oral intake, alcohol use.  She was drinking excessive amount of alcohol and not eating but he was still taking his insulin resulting in episodes of hypoglycemia,, diabetic education  requested, consult from nutritionist also requested. Allow some permissive Hyperglycemia rather than risk life-threatening hypoglycemia in a patient with unreliable oral intake. Use Novolog/Humalog Sliding scale insulin with Accu-Cheks/Fingersticks as ordered. HgA1C is 11.1 from 14 %,   2)Episode of unresponsiveness, acute encephalopathy, stuttering speech. Resolved. Presumed  due to hypoglycemia and possible alcoholic intoxication (no alcohol level was checked on admission), no reported seizures, no incontinence. Neuro exam is non focal. MRI brain: no acute infarcts. Mental status is back to baseline. Prior Hospitalist d/w Dr. Leonel Ramsay who did not recommend neurology consultation at this time, no antiepileptics. EEG: unremarkable   3)Alcoholism-  monitor on ciwa. No s/s of acute withdrawals. ,   4)Lactic acidosis in the setting of hypoglycemia, alcohol  Use. Resolved with iv fluids. Abdominal exam is benign.   5)H/o seizure. Per patient: last seizure two years ago. Unclear if etoh withdrawal seizure. He denies taking any antiepileptics at home. No reported seizure at home this t time   6)Moderate Protein-Caloric Malnutrition- due to excessive alcohol intake and not enough food/caloric intake, dietitian consult requested  Code Status: full Family Communication: d/w patient, RN   Disposition Plan: home 24-48 hrs    Consultants:  Phone consult  Procedures:  EEG   DVT Prophylaxis  :- SCDs   Lab Results  Component Value Date   PLT 185 03/27/2017    Inpatient Medications  Scheduled  Meds: . ARIPiprazole  5 mg Oral Daily  . aspirin  81 mg Oral Daily  . DULoxetine  60 mg Oral Daily  . folic acid  1 mg Oral Daily  . gabapentin  1,200 mg Oral QHS  . gabapentin  900 mg Oral BID  . insulin aspart  0-5 Units Subcutaneous QHS  . insulin aspart  0-9 Units Subcutaneous TID WC  . insulin glargine  4 Units Subcutaneous QHS  . lipase/protease/amylase  24,000 Units Oral TID AC    . multivitamin with minerals  1 tablet Oral Daily  . pantoprazole  40 mg Oral Daily  . thiamine  100 mg Intravenous Daily  . thiamine  100 mg Oral Daily   Continuous Infusions: PRN Meds:.ibuprofen, LORazepam **OR** LORazepam    Anti-infectives (From admission, onward)   None        Objective:   Vitals:   03/28/17 2055 03/29/17 0435 03/29/17 0516 03/29/17 1316  BP: 129/75 99/70  140/86  Pulse: 77 75  75  Resp: 18 18    Temp: 98.1 F (36.7 C) 97.9 F (36.6 C)  98.1 F (36.7 C)  TempSrc: Oral Oral  Oral  SpO2: 100% 99%  100%  Weight:   63.4 kg (139 lb 12.4 oz)   Height:        Wt Readings from Last 3 Encounters:  03/29/17 63.4 kg (139 lb 12.4 oz)  11/30/16 63.3 kg (139 lb 8 oz)  10/13/16 66.2 kg (146 lb)     Intake/Output Summary (Last 24 hours) at 03/29/2017 1821 Last data filed at 03/29/2017 1430 Gross per 24 hour  Intake 720 ml  Output -  Net 720 ml     Physical Exam  Gen:- Awake Alert,  In no apparent distress , emanciated HEENT:- [place].AT, No sclera icterus Neck-Supple Neck,No JVD,.  Lungs-  CTAB  CV- S1, S2 normal Abd-  +ve B.Sounds, Abd Soft, No tenderness,    Extremity/Skin:- No  edema,    Psych-affect is appropriate, oriented x3 Neuro-no new focal deficits, ambulates with a walker   Data Review:   Micro Results Recent Results (from the past 240 hour(s))  Urine culture     Status: None   Collection Time: 03/26/17  1:11 PM  Result Value Ref Range Status   Specimen Description   Final    URINE, RANDOM Performed at [facility] [address], [place], [place] [address]    Special Requests   Final    NONE Performed at [facility], [place] [address], [place], [place] [address]    Culture   Final    NO GROWTH Performed at [facility], [place] [address], [place], [place] [address]    Report Status 03/27/2017 FINAL  Final  MRSA PCR Screening     Status: None   Collection Time: 03/26/17 10:00 PM  Result  Value Ref Range Status   MRSA by PCR NEGATIVE NEGATIVE Final    Comment:        The GeneXpert MRSA Assay (FDA approved for NASAL specimens only), is one component of a comprehensive MRSA colonization surveillance program. It is not intended to diagnose MRSA infection nor to guide or monitor treatment for MRSA infections. Performed at [facility], [place] [address], [place], [place] [address]     Radiology Reports Ct Angio Head W Or Wo Contrast  Result Date: 03/26/2017 CLINICAL DATA:  Stuttering. Stroke follow-up. History of diabetes with CBG of 15. EXAM: CT  ANGIOGRAPHY HEAD AND NECK TECHNIQUE: Multidetector CT imaging of the head and neck was performed using the standard protocol during bolus administration of intravenous contrast. Multiplanar CT image reconstructions and MIPs were obtained to evaluate the vascular anatomy. Carotid stenosis measurements (when applicable) are obtained utilizing NASCET criteria, using the distal internal carotid diameter as the denominator. CONTRAST:  18mL ISOVUE-370 IOPAMIDOL (ISOVUE-370) INJECTION 76% COMPARISON:  Brain MRI 06/12/2016 FINDINGS: CT HEAD FINDINGS Brain: Moderate to advanced low-density in the cerebral white matter compatible with chronic small vessel ischemia. No acute hemorrhage, hydrocephalus, or masslike finding. Vascular: See below Skull: Negative Sinuses: Negative Orbits: Negative Review of the MIP images confirms the above findings CTA NECK FINDINGS Aortic arch: Mild atherosclerotic plaque. Left vertebral artery arises from the arch. Right carotid system: Atherosclerotic plaque along the common carotid artery. No flow limiting stenosis, ulceration, or beading. Left carotid system: Mild atherosclerotic plaque at the common carotid bifurcation. No flow limiting stenosis, ulceration, or beading. Vertebral arteries: Left dominant vertebral artery. Both vessels are smooth and diffusely patent to the dura. No proximal subclavian  atherosclerosis. Skeleton: Degenerative changes without acute or aggressive finding. Other neck: Small area of stranding and subcutaneous gas in the right low posterior triangle, question attempted vascular access. Upper chest: Paraseptal emphysema, mild. Review of the MIP images confirms the above findings CTA HEAD FINDINGS Anterior circulation: Atherosclerotic plaque on both carotid siphons. No flow limiting stenosis no major branch occlusion. Negative for aneurysm. Posterior circulation: Strong left vertebral artery dominance. The vertebral and basilar arteries are smooth and diffusely patent. Symmetric flow in bilateral PCAs. Hypoplastic left more than right P1 segments. Venous sinuses: Negative Anatomic variants: As above Delayed phase: No abnormal intracranial enhancement Review of the MIP images confirms the above findings IMPRESSION: 1. No acute finding. 2. Atherosclerosis without flow limiting stenosis or ulceration in the head or neck. 3. Extensive chronic small vessel ischemia in the cerebral white matter. 4. Small area of stranding and gas at the right neck base, question attempted vascular access. Electronically Signed   By: Monte Fantasia M.D.   On: 03/26/2017 11:46   Dg Chest 2 View  Result Date: 03/26/2017 CLINICAL DATA:  Hypoglycemia. EXAM: CHEST  2 VIEW COMPARISON:  06/08/2016 FINDINGS: Cardiac silhouette is normal in size. No mediastinal or hilar masses. No evidence of adenopathy. Clear lungs.  No pleural effusion or pneumothorax. Skeletal structures are intact. IMPRESSION: No active cardiopulmonary disease. Electronically Signed   By: Lajean Manes M.D.   On: 03/26/2017 13:41   Ct Head Wo Contrast  Result Date: 03/26/2017 CLINICAL DATA:  Stuttering. Stroke follow-up. History of diabetes with CBG of 15. EXAM: CT ANGIOGRAPHY HEAD AND NECK TECHNIQUE: Multidetector CT imaging of the head and neck was performed using the standard protocol during bolus administration of intravenous contrast.  Multiplanar CT image reconstructions and MIPs were obtained to evaluate the vascular anatomy. Carotid stenosis measurements (when applicable) are obtained utilizing NASCET criteria, using the distal internal carotid diameter as the denominator. CONTRAST:  139mL ISOVUE-370 IOPAMIDOL (ISOVUE-370) INJECTION 76% COMPARISON:  Brain MRI 06/12/2016 FINDINGS: CT HEAD FINDINGS Brain: Moderate to advanced low-density in the cerebral white matter compatible with chronic small vessel ischemia. No acute hemorrhage, hydrocephalus, or masslike finding. Vascular: See below Skull: Negative Sinuses: Negative Orbits: Negative Review of the MIP images confirms the above findings CTA NECK FINDINGS Aortic arch: Mild atherosclerotic plaque. Left vertebral artery arises from the arch. Right carotid system: Atherosclerotic plaque along the common carotid artery. No flow limiting stenosis, ulceration, or  beading. Left carotid system: Mild atherosclerotic plaque at the common carotid bifurcation. No flow limiting stenosis, ulceration, or beading. Vertebral arteries: Left dominant vertebral artery. Both vessels are smooth and diffusely patent to the dura. No proximal subclavian atherosclerosis. Skeleton: Degenerative changes without acute or aggressive finding. Other neck: Small area of stranding and subcutaneous gas in the right low posterior triangle, question attempted vascular access. Upper chest: Paraseptal emphysema, mild. Review of the MIP images confirms the above findings CTA HEAD FINDINGS Anterior circulation: Atherosclerotic plaque on both carotid siphons. No flow limiting stenosis no major branch occlusion. Negative for aneurysm. Posterior circulation: Strong left vertebral artery dominance. The vertebral and basilar arteries are smooth and diffusely patent. Symmetric flow in bilateral PCAs. Hypoplastic left more than right P1 segments. Venous sinuses: Negative Anatomic variants: As above Delayed phase: No abnormal intracranial  enhancement Review of the MIP images confirms the above findings IMPRESSION: 1. No acute finding. 2. Atherosclerosis without flow limiting stenosis or ulceration in the head or neck. 3. Extensive chronic small vessel ischemia in the cerebral white matter. 4. Small area of stranding and gas at the right neck base, question attempted vascular access. Electronically Signed   By: Monte Fantasia M.D.   On: 03/26/2017 11:46   Ct Angio Neck W And/or Wo Contrast  Result Date: 03/26/2017 CLINICAL DATA:  Stuttering. Stroke follow-up. History of diabetes with CBG of 15. EXAM: CT ANGIOGRAPHY HEAD AND NECK TECHNIQUE: Multidetector CT imaging of the head and neck was performed using the standard protocol during bolus administration of intravenous contrast. Multiplanar CT image reconstructions and MIPs were obtained to evaluate the vascular anatomy. Carotid stenosis measurements (when applicable) are obtained utilizing NASCET criteria, using the distal internal carotid diameter as the denominator. CONTRAST:  125mL ISOVUE-370 IOPAMIDOL (ISOVUE-370) INJECTION 76% COMPARISON:  Brain MRI 06/12/2016 FINDINGS: CT HEAD FINDINGS Brain: Moderate to advanced low-density in the cerebral white matter compatible with chronic small vessel ischemia. No acute hemorrhage, hydrocephalus, or masslike finding. Vascular: See below Skull: Negative Sinuses: Negative Orbits: Negative Review of the MIP images confirms the above findings CTA NECK FINDINGS Aortic arch: Mild atherosclerotic plaque. Left vertebral artery arises from the arch. Right carotid system: Atherosclerotic plaque along the common carotid artery. No flow limiting stenosis, ulceration, or beading. Left carotid system: Mild atherosclerotic plaque at the common carotid bifurcation. No flow limiting stenosis, ulceration, or beading. Vertebral arteries: Left dominant vertebral artery. Both vessels are smooth and diffusely patent to the dura. No proximal subclavian atherosclerosis.  Skeleton: Degenerative changes without acute or aggressive finding. Other neck: Small area of stranding and subcutaneous gas in the right low posterior triangle, question attempted vascular access. Upper chest: Paraseptal emphysema, mild. Review of the MIP images confirms the above findings CTA HEAD FINDINGS Anterior circulation: Atherosclerotic plaque on both carotid siphons. No flow limiting stenosis no major branch occlusion. Negative for aneurysm. Posterior circulation: Strong left vertebral artery dominance. The vertebral and basilar arteries are smooth and diffusely patent. Symmetric flow in bilateral PCAs. Hypoplastic left more than right P1 segments. Venous sinuses: Negative Anatomic variants: As above Delayed phase: No abnormal intracranial enhancement Review of the MIP images confirms the above findings IMPRESSION: 1. No acute finding. 2. Atherosclerosis without flow limiting stenosis or ulceration in the head or neck. 3. Extensive chronic small vessel ischemia in the cerebral white matter. 4. Small area of stranding and gas at the right neck base, question attempted vascular access. Electronically Signed   By: Monte Fantasia M.D.   On: 03/26/2017 11:46  Mr Brain Wo Contrast  Result Date: 03/26/2017 CLINICAL DATA:  Altered level of consciousness, hypoglycemia. History of bipolar disorder, diabetes and seizures. EXAM: MRI HEAD WITHOUT CONTRAST TECHNIQUE: Multiplanar, multiecho pulse sequences of the brain and surrounding structures were obtained without intravenous contrast. COMPARISON:  CT HEAD March 26, 2017 and MRI of the head Jun 12, 2016 FINDINGS: Multiple sequences are moderately motion degraded. INTRACRANIAL CONTENTS: No reduced diffusion to suggest acute ischemia. No susceptibility artifact to suggest hemorrhage. Mild parenchymal brain volume loss for age. No hydrocephalus. Patchy to confluent supratentorial white matter FLAIR T2 hyperintensities. Old bilateral thalamus lacunar infarcts. No  suspicious parenchymal signal, masses, mass effect. No abnormal extra-axial fluid collections. No extra-axial masses. VASCULAR: Normal major intracranial vascular flow voids present at skull base. SKULL AND UPPER CERVICAL SPINE: No abnormal sellar expansion. No suspicious calvarial bone marrow signal. Craniocervical junction maintained. SINUSES/ORBITS: Mild paranasal sinus mucosal thickening. Small bilateral mastoid effusions.The included ocular globes and orbital contents are non-suspicious. OTHER: Patient is edentulous. IMPRESSION: 1. No acute intracranial process on this motion degraded examination. 2. Moderate to severe chronic small vessel ischemic disease and old bilateral thalamus lacunar infarcts. 3. Mild parenchymal brain volume loss. Electronically Signed   By: Elon Alas M.D.   On: 03/26/2017 19:11    CBC Recent Labs  Lab 03/26/17 0939 03/26/17 0958 03/27/17 0317  WBC 8.9  --  10.1  HGB 16.1 16.7 14.4  HCT 45.7 49.0 41.5  PLT 233  --  185  MCV 96.4  --  96.3  MCH 34.0  --  33.4  MCHC 35.2  --  34.7  RDW 14.4  --  14.2  LYMPHSABS 1.1  --   --   MONOABS 0.4  --   --   EOSABS 0.0  --   --   BASOSABS 0.0  --   --    Chemistries  Recent Labs  Lab 03/26/17 0939 03/26/17 0958 03/27/17 0317  NA 145 145 138  K 3.8 3.8 3.5  CL 106 104 105  CO2 26  --  23  GLUCOSE 62* 56* 204*  BUN 15 16 10   CREATININE 0.68 0.90 0.60*  CALCIUM 8.0*  --  7.9*  AST 177*  --   --   ALT 148*  --   --   ALKPHOS 309*  --   --   BILITOT 0.8  --   --    ------------------------------------------------------------------------------------------------------------------ No results for input(s): CHOL, HDL, LDLCALC, TRIG, CHOLHDL, LDLDIRECT in the last 72 hours.  Lab Results  Component Value Date   HGBA1C 11.1 (H) 03/27/2017   ------------------------------------------------------------------------------------------------------------------ No results for input(s): TSH, T4TOTAL, T3FREE,  THYROIDAB in the last 72 hours.  Invalid input(s): FREET3 ------------------------------------------------------------------------------------------------------------------ No results for input(s): VITAMINB12, FOLATE, FERRITIN, TIBC, IRON, RETICCTPCT in the last 72 hours.  Coagulation profile No results for input(s): INR, PROTIME in the last 168 hours.  No results for input(s): DDIMER in the last 72 hours.  Cardiac Enzymes No results for input(s): CKMB, TROPONINI, MYOGLOBIN in the last 168 hours.  Invalid input(s): CK ------------------------------------------------------------------------------------------------------------------    Component Value Date/Time   BNP 32.8 12/04/2014 0945   Mariena Meares M.D on 03/29/2017 at 6:21 PM  Between 7am to 7pm - Pager - [phone]  After 7pm go to [email or web address] - password [email or web address]  [facility] -  Office  [phone]   Voice Recognition Viviann Spare dictation system was used to create this note, attempts have been made to correct errors. Please contact the author with questions  and/or clarifications.

## 2017-03-29 NOTE — Progress Notes (Signed)
Inpatient Diabetes Program Recommendations  AACE/ADA: New Consensus Statement on Inpatient Glycemic Control (2015)  Target Ranges:  Prepandial:   less than 140 mg/dL      Peak postprandial:   less than 180 mg/dL (1-2 hours)      Critically ill patients:  140 - 180 mg/dL   Lab Results  Component Value Date   GLUCAP 75 03/29/2017   HGBA1C 11.1 (H) 03/27/2017    Review of Glycemic Control  Diabetes history: DM2 Outpatient Diabetes medications: Levemir 20 units QAM, Novolog 06-01-08 tidwc Current orders for Inpatient glycemic control: Lantus 4 units QHS, Novolog 0-9 units tidwc and hs  HgbA1C - 11.1% - Down from 14%. Long discussion with pt regarding how much insulin he takes at home. States he checks blood sugars 3x/day, has occasional lows. On Levemir 20 units QAM and Novolog 06-01-08 at meals. Admits to drinking ETOH and being stressed. Explained how stress runs blood sugars up. Pt states he ate candy and chocolate chip cookies from vending machine last night. Discussed appropriate food choices, insulin pen technique and hypoglycemia s/s and treatment.  Inpatient Diabetes Program Recommendations:     Levemir 5 units bid   For home: Levemir 5 units bid Novolog 5 units tidwc.  Goal for HgbA1C - 8%.  Will f/u in am with pt.  Thank you. Lorenda Peck, RD, LDN, CDE Inpatient Diabetes Coordinator [phone]

## 2017-03-30 DIAGNOSIS — E440 Moderate protein-calorie malnutrition: Secondary | ICD-10-CM

## 2017-03-30 LAB — GLUCOSE, CAPILLARY
GLUCOSE-CAPILLARY: 286 mg/dL — AB (ref 65–99)
Glucose-Capillary: 182 mg/dL — ABNORMAL HIGH (ref 65–99)

## 2017-03-30 LAB — MAGNESIUM: Magnesium: 1.7 mg/dL (ref 1.7–2.4)

## 2017-03-30 MED ORDER — GLUCERNA SHAKE PO LIQD
237.0000 mL | Freq: Three times a day (TID) | ORAL | Status: DC
  Filled 2017-03-30 (×2): qty 237

## 2017-03-30 MED ORDER — VITAMIN B-1 100 MG PO TABS: 100.0000 mg | ORAL_TABLET | Freq: Every day | ORAL | 5 refills | Status: DC

## 2017-03-30 MED ORDER — FOLIC ACID 1 MG PO TABS: 1.0000 mg | ORAL_TABLET | Freq: Every day | ORAL | 5 refills | Status: AC

## 2017-03-30 MED ORDER — GLUCERNA SHAKE PO LIQD: 237.0000 mL | Freq: Three times a day (TID) | ORAL | 2 refills | Status: AC

## 2017-03-30 MED ORDER — LEVEMIR FLEXTOUCH 100 UNIT/ML ~~LOC~~ SOPN
PEN_INJECTOR | SUBCUTANEOUS | 0 refills | Status: AC
Start: 2017-03-30 — End: ?

## 2017-03-30 MED ORDER — INSULIN ASPART 100 UNIT/ML FLEXPEN: PEN_INJECTOR | SUBCUTANEOUS | 3 refills | Status: AC

## 2017-03-30 MED ORDER — ARIPIPRAZOLE 5 MG PO TABS: 5.0000 mg | ORAL_TABLET | Freq: Every day | ORAL | 5 refills | Status: DC

## 2017-03-30 MED ORDER — ACCU-CHEK AVIVA VI SOLN: 1.0000 [drp] | 4 refills | Status: AC | PRN

## 2017-03-30 MED ORDER — BD SWAB SINGLE USE REGULAR PADS: MEDICATED_PAD | 5 refills | Status: AC

## 2017-03-30 MED ORDER — ADULT MULTIVITAMIN W/MINERALS CH: 1.0000 | ORAL_TABLET | Freq: Every day | ORAL | 4 refills | Status: DC

## 2017-03-30 MED ORDER — DULOXETINE HCL 60 MG PO CPEP: 60.0000 mg | ORAL_CAPSULE | Freq: Every day | ORAL | 3 refills | Status: DC

## 2017-03-30 MED ORDER — GLUCOSE BLOOD VI STRP: ORAL_STRIP | 12 refills | Status: AC

## 2017-03-30 MED ORDER — PANTOPRAZOLE SODIUM 40 MG PO TBEC: 40.0000 mg | DELAYED_RELEASE_TABLET | Freq: Every day | ORAL | 5 refills | Status: AC

## 2017-03-30 MED ORDER — ACCU-CHEK SOFTCLIX LANCETS MISC: 5 refills | Status: AC

## 2017-03-30 MED ORDER — ACCU-CHEK AVIVA PLUS W/DEVICE KIT: 1.0000 | PACK | Freq: Every morning | 0 refills | Status: AC

## 2017-03-30 MED ORDER — INSULIN ASPART 100 UNIT/ML FLEXPEN: 5.0000 [IU] | PEN_INJECTOR | Freq: Three times a day (TID) | SUBCUTANEOUS | 3 refills | Status: DC

## 2017-03-30 MED ORDER — HYDROXYZINE HCL 10 MG PO TABS: 10.0000 mg | ORAL_TABLET | Freq: Three times a day (TID) | ORAL | 5 refills | Status: DC | PRN

## 2017-03-30 NOTE — Progress Notes (Signed)
Initial Nutrition Assessment  DOCUMENTATION CODES:   Non-severe (moderate) malnutrition in context of social or environmental circumstances  INTERVENTION:   Glucerna Shake po TID, each supplement provides 220 kcal and 10 grams of protein  NUTRITION DIAGNOSIS:   Moderate Malnutrition related to social / environmental circumstances(alcohol abuse) as evidenced by moderate muscle depletion, severe muscle depletion, moderate fat depletion.  GOAL:   Patient will meet greater than or equal to 90% of their needs  MONITOR:   PO intake, Supplement acceptance, Weight trends, Labs  REASON FOR ASSESSMENT:   Consult Diet education  ASSESSMENT:   Patient with PMH significant for diabetes, alcohol abuse, bipolar disorder, chronic alcoholic pancreatitis, seizures was found down this morning by family members EMS was called and his sugar was 14. Admitted for acute metabolic encephalopathy.    Spoke with pt at bedside. Reports intake is off and on depending on how much beer he drinks. He typically drinks 6-8 [place] per day and states once he is "drunk" he forgets to eat. He usually consumes 2 meals per day with food options like potato salad, pork chops, baked chicken, baked fish, and greens. Pt eating 100% of meals since admission. Denies any swallowing issues or n/v. Discussed how alcohol affects blood sugar and briefly reviewed eating for diabetes. Provided "Carbohydrate Counting for People with Diabetes" handout from the Academy of Nutrition and Dietetics.   Pt endorses a UBW of 160 lb and a 60 lb wt loss within the year. Records indicate pt weighed 159 lb 08/04/16 and 139 lb this admission (12.5% in 8 months, insignificant for time frame). Nutrition-Focused physical exam completed.   Medications reviewed and include: folic acid, SSI, Lantus, creon, MVI with minerals, thiamine Labs reviewed: AST 84 (H) ALT 89 (H)  NUTRITION - FOCUSED PHYSICAL EXAM:    Most Recent Value  Orbital  Region  Mild depletion  Upper Arm Region  Moderate depletion  Thoracic and Lumbar Region  Moderate depletion  Buccal Region  Moderate depletion  Temple Region  Severe depletion  Clavicle Bone Region  Severe depletion  Clavicle and Acromion Bone Region  Severe depletion  Scapular Bone Region  Moderate depletion  Dorsal Hand  Severe depletion  Patellar Region  Severe depletion  Anterior Thigh Region  Severe depletion  Posterior Calf Region  Moderate depletion  Edema (RD Assessment)  None  Hair  Reviewed  Eyes  Reviewed  Mouth  Reviewed  Skin  Reviewed  Nails  Reviewed     Diet Order:  Diet Carb Modified Fluid consistency: Thin; Room service appropriate? Yes  EDUCATION NEEDS:   Not appropriate for education at this time  Skin:  Skin Assessment: Reviewed RN Assessment  Last BM:  03/29/17  Height:   Ht Readings from Last 1 Encounters:  03/26/17 5\' 4"  (1.626 m)    Weight:   Wt Readings from Last 1 Encounters:  03/30/17 139 lb 5.3 oz (63.2 kg)    Ideal Body Weight:  59.1 kg  BMI:  Body mass index is 23.92 kg/m.  Estimated Nutritional Needs:   Kcal:  1750-1950 kcal/day  Protein:  87-97 g/day  Fluid:  >1.7 L/day    Mariana Single RD, LDN Clinical Nutrition Pager # - [phone]

## 2017-03-30 NOTE — Discharge Summary (Signed)
Michael [name], is a 62 y.o. male  DOB 1955/08/09  MRN [id number].  Admission date:  03/26/2017  Admitting Physician  Phillips Grout, MD  Discharge Date:  03/30/2017   Primary MD  Helane Rima, MD  Recommendations for primary care physician for things to follow:   1) take medications as prescribed 2) check blood sugars 3-4 times a day and keep diary/Record 3)Followl up with your doctor in 1-2 weeks for recheck 4)Quit Alcohol !!! 5)Quit Smoking !!! 6)Avoid excessive sweets and sugars   Admission Diagnosis  Aphasia [R47.01] Hypoglycemia [E16.2] Altered mental status, unspecified altered mental status type [R41.82]   Discharge Diagnosis  Aphasia [R47.01] Hypoglycemia [E16.2] Altered mental status, unspecified altered mental status type [R41.82]    Principal Problem:   Hypoglycemia Active Problems:   Type 2 diabetes mellitus with peripheral neuropathy ([place])   HTN (hypertension)   Chronic alcoholic pancreatitis ([place])   Bipolar disorder (HCC)   Wernicke's encephalopathy   Acute metabolic encephalopathy   Malnutrition of moderate degree      Past Medical History:  Diagnosis Date  . Anxiety   . Arthritis    "joints; shoulders; feet" (06/08/2016)  . Bipolar disorder ([place])   . Daily headache    "7:30 - 8:00 q night" (06/08/2016)  . Diabetic peripheral neuropathy ([place])   . GERD (gastroesophageal reflux disease)   . Pancreatitis   . Schizophrenia ([place])   . Seizures ([place]) 01/2016 X 2  . Type II diabetes mellitus ([place])     Past Surgical History:  Procedure Laterality Date  . BALLOON DILATION  03/22/2011   Procedure: BALLOON DILATION;  Surgeon: Gatha Mayer, MD;  Location: [facility];  Service: Endoscopy;  Laterality: N/A;  . COLONOSCOPY N/A 05/22/2012   Procedure: COLONOSCOPY;  Surgeon: Gatha Mayer, MD;  Location: [facility];  Service: Endoscopy;  Laterality: N/A;  . COLONOSCOPY W/  BIOPSIES AND POLYPECTOMY  09/12/11  . ESOPHAGOGASTRODUODENOSCOPY  03/22/2011   Procedure: ESOPHAGOGASTRODUODENOSCOPY (EGD);  Surgeon: Gatha Mayer, MD;  Location: [facility];  Service: Endoscopy;  Laterality: N/A;  egd with balloon   . EUS  04/20/2011   Procedure: UPPER ENDOSCOPIC ULTRASOUND (EUS) LINEAR;  Surgeon: Milus Banister, MD;  Location: [facility];  Service: Endoscopy;  Laterality: N/A;  . KNEE ARTHROSCOPY Left      HPI  from the history and physical done on the day of admission:     Chief Complaint: Found down in his sugar was 14  HPI: Michael [name] is a 62 y.o. male with medical history significant of diabetes, alcohol abuse, bipolar disorder, seizures was found down this morning by family members EMS was called and his sugar was 14.  Patient was unresponsive at the time.  Since then he has been giving glucose and his mental status has returned to normal.  Patient is however reporting some stuttering that is a new problem for him.  He does not recall what happened last night.  He does recall not having any recent illnesses though  prior to last night.  He denies any nausea vomiting or diarrhea.  Patient is hungry and is up in his bed about to eat.  He denies any numbness tingling or weakness anywhere.  The stuttering is new and is not improved with resolution of his hypoglycemia.  Patient is referred for admission for his severe hypoglycemia along with new stuttering.  Review of Systems: As per HPI otherwise 10 point review of systems negative.        Hospital Course:     Brief summary   62 y.o.malewith medical history significant ofdiabetes, alcohol abuse, bipolar disorder, seizures, alcoholism, h/o wernicke's, tobacco use, was found down this morning by family members EMS was called and his sugar was 14. Patient was unresponsive at the time. Since then he has been giving glucose and his mental status has returned to normal. Patient is however reporting some  stuttering that is a new problem for him. He does not recall what happened last night. He does recall not having any recent illnesses though prior to last night. He denies any nausea vomiting or diarrhea. Patient is hungry and is up in his bed about to eat. He denies any numbness tingling or weakness anywhere. The stuttering is new and is not improved with resolution of his hypoglycemia. Patient is referred for admission for his severe hypoglycemia along with new stuttering.    Assessment/Plan:  1)Hypoglycemia/DM-  suspect insulin induced, poor oral intake, alcohol use.  Pt was drinking excessive amount of alcohol and not eating but he was still taking his insulin resulting in episodes of hypoglycemia,, diabetic education appreciated, consult from nutritionist also noted.   HgA1C is 11.1 from 14 %, discharged home with home health RN to help with medication compliance and dietary modifications  2)Episode of unresponsiveness, acute encephalopathy, stuttering speech.Resolved.Presumed  due to hypoglycemia and possible alcoholic intoxication (no alcohol level was checked on admission), no reported seizures, no incontinence. Neuro exam is non focal. MRI brain: no acute infarcts. Mental status is back to baseline. Prior Hospitalist d/w Dr. Leonel Ramsay who did not recommend neurology consultation at this time, no antiepileptics.EEG: unremarkable--- no acute neuro findings , patient is ambulating the hallways with walker without problems at this time  3)Alcoholism-  No s/s of acute withdrawals. ,   Discharged on multivitamins including thiamine and folic acid  4)Lactic acidosis in the setting of hypoglycemia, alcohol Use. Resolved with iv fluids. Abdominal exam is benign.   5)H/o seizure. Per patient: last seizure two years ago. Unclear if etoh withdrawal seizure. He denies taking any antiepileptics at home. No reported seizure at home this  time   6)Moderate Protein-Caloric  Malnutrition- due to excessive alcohol intake and not enough food/caloric intake, dietitian consult appreciated   Procedures:  EEG  Discharge Condition: stable  Discharge Instructions    Discharge Instructions    (HEART FAILURE PATIENTS) Call MD:  Anytime you have any of the following symptoms: 1) 3 pound weight gain in 24 hours or 5 pounds in 1 week 2) shortness of breath, with or without a dry hacking cough 3) swelling in the hands, feet or stomach 4) if you have to sleep on extra pillows at night in order to breathe.   Complete by:  As directed    Call MD for:  difficulty breathing, headache or visual disturbances   Complete by:  As directed    Call MD for:  persistant dizziness or light-headedness   Complete by:  As directed    Call MD for:  persistant nausea and vomiting   Complete by:  As directed    Call MD for:  severe uncontrolled pain   Complete by:  As directed    Call MD for:  temperature >100.4   Complete by:  As directed    Diet - low sodium heart healthy   Complete by:  As directed    Discharge instructions   Complete by:  As directed    1) take medications as prescribed 2) check blood sugars 3-4 times a day and keep diary/Record 3)Followl up with your doctor in 1-2 weeks for recheck 4)Quit Alcohol !!! 5)Quit Smoking !!! 6)Avoid excessive sweets and sugars   Increase activity slowly   Complete by:  As directed         Discharge Medications     Allergies as of 03/30/2017      Reactions   Ace Inhibitors Swelling, Other (See Comments)   Angioedema - unquestionable   Losartan Swelling, Other (See Comments)   Pt presented with unquestionable angioedema on ACEIs (Angiotensin-converting enzyme (ACE) inhibitors) so aviod ARBs (Angiotensin receptor blockers), as well   Tylenol [acetaminophen] Other (See Comments)   "cannot have this" (contraindicated)      Medication List    STOP taking these medications   ibuprofen 800 MG tablet Commonly known as:   ADVIL,MOTRIN     TAKE these medications   ACCU-CHEK AVIVA PLUS w/Device Kit 1 kit by Does not apply route every morning. diag code E11.42. Insulin dependent What changed:  additional instructions   ACCU-CHEK AVIVA Soln 1 drop by In Vitro route as needed. diag code E08.10 insulin dependent. Use as device directs   ACCU-CHEK SOFTCLIX LANCETS lancets Use 3 times daily to check blood sugar. diag code E11.42. Insulin dependent   ARIPiprazole 5 MG tablet Commonly known as:  ABILIFY Take 1 tablet (5 mg total) by mouth daily.   B-D SINGLE USE SWABS REGULAR Pads Patient uses 6 times a day. Patient tests 3 times per day and administers insulin 3 times a day. ICD 10 code E11.42   diclofenac sodium 1 % Gel Commonly known as:  VOLTAREN Apply 2 g topically 4 (four) times daily -  before meals and at bedtime.   DULoxetine 60 MG capsule Commonly known as:  CYMBALTA Take 1 capsule (60 mg total) by mouth daily.   feeding supplement (GLUCERNA SHAKE) Liqd Take 237 mLs by mouth 3 (three) times daily between meals.   folic acid 1 MG tablet Commonly known as:  FOLVITE Take 1 tablet (1 mg total) by mouth daily.   gabapentin 300 MG capsule Commonly known as:  NEURONTIN TAKE 3 CAPSULES BY MOUTH  EVERY MORNING AND  AFTERNOON. TAKE 4 CAPSULES  BY MOUTH AT NIGHT BEFORE  BED What changed:    how much to take  how to take this  when to take this  additional instructions   glucose blood test strip Commonly known as:  ACCU-CHEK AVIVA PLUS Use 3 times daily to check blood sugar. diag code E11.42. Insulin dependent   hydrOXYzine 10 MG tablet Commonly known as:  ATARAX/VISTARIL Take 1 tablet (10 mg total) by mouth 3 (three) times daily as needed for itching.   insulin aspart 100 UNIT/ML FlexPen Commonly known as:  NOVOLOG Take 5 units before breakfast,   before lunch, and 5 units before dinner (5 units before each meal) What changed:  additional instructions   LEVEMIR FLEXTOUCH 100 UNIT/ML  Pen Generic drug:  Insulin Detemir 4 units Twice a  day What changed:    how much to take  how to take this  when to take this  additional instructions   lipase/protease/amylase 36000 UNITS Cpep capsule Commonly known as:  CREON TAKE 1 CAPSULE BY MOUTH 3  TIMES DAILY BEFORE MEALS   multivitamin with minerals Tabs tablet Take 1 tablet by mouth daily.   omega-3 acid ethyl esters 1 g capsule Commonly known as:  LOVAZA Take 1 capsule (1 g total) by mouth daily.   pantoprazole 40 MG tablet Commonly known as:  PROTONIX Take 1 tablet (40 mg total) by mouth daily.   pravastatin 10 MG tablet Commonly known as:  PRAVACHOL Take 1 tablet (10 mg total) by mouth daily at 6 PM.   thiamine 100 MG tablet Commonly known as:  VITAMIN B-1 Take 1 tablet (100 mg total) by mouth daily.       Major procedures and Radiology Reports - PLEASE review detailed and final reports for all details, in brief -   Ct Angio Head W Or Wo Contrast  Result Date: 03/26/2017 CLINICAL DATA:  Stuttering. Stroke follow-up. History of diabetes with CBG of 15. EXAM: CT ANGIOGRAPHY HEAD AND NECK TECHNIQUE: Multidetector CT imaging of the head and neck was performed using the standard protocol during bolus administration of intravenous contrast. Multiplanar CT image reconstructions and MIPs were obtained to evaluate the vascular anatomy. Carotid stenosis measurements (when applicable) are obtained utilizing NASCET criteria, using the distal internal carotid diameter as the denominator. CONTRAST:  124m ISOVUE-370 IOPAMIDOL (ISOVUE-370) INJECTION 76% COMPARISON:  Brain MRI 06/12/2016 FINDINGS: CT HEAD FINDINGS Brain: Moderate to advanced low-density in the cerebral white matter compatible with chronic small vessel ischemia. No acute hemorrhage, hydrocephalus, or masslike finding. Vascular: See below Skull: Negative Sinuses: Negative Orbits: Negative Review of the MIP images confirms the above findings CTA NECK FINDINGS  Aortic arch: Mild atherosclerotic plaque. Left vertebral artery arises from the arch. Right carotid system: Atherosclerotic plaque along the common carotid artery. No flow limiting stenosis, ulceration, or beading. Left carotid system: Mild atherosclerotic plaque at the common carotid bifurcation. No flow limiting stenosis, ulceration, or beading. Vertebral arteries: Left dominant vertebral artery. Both vessels are smooth and diffusely patent to the dura. No proximal subclavian atherosclerosis. Skeleton: Degenerative changes without acute or aggressive finding. Other neck: Small area of stranding and subcutaneous gas in the right low posterior triangle, question attempted vascular access. Upper chest: Paraseptal emphysema, mild. Review of the MIP images confirms the above findings CTA HEAD FINDINGS Anterior circulation: Atherosclerotic plaque on both carotid siphons. No flow limiting stenosis no major branch occlusion. Negative for aneurysm. Posterior circulation: Strong left vertebral artery dominance. The vertebral and basilar arteries are smooth and diffusely patent. Symmetric flow in bilateral PCAs. Hypoplastic left more than right P1 segments. Venous sinuses: Negative Anatomic variants: As above Delayed phase: No abnormal intracranial enhancement Review of the MIP images confirms the above findings IMPRESSION: 1. No acute finding. 2. Atherosclerosis without flow limiting stenosis or ulceration in the head or neck. 3. Extensive chronic small vessel ischemia in the cerebral white matter. 4. Small area of stranding and gas at the right neck base, question attempted vascular access. Electronically Signed   By: JMonte FantasiaM.D.   On: 03/26/2017 11:46   Dg Chest 2 View  Result Date: 03/26/2017 CLINICAL DATA:  Hypoglycemia. EXAM: CHEST  2 VIEW COMPARISON:  06/08/2016 FINDINGS: Cardiac silhouette is normal in size. No mediastinal or hilar masses. No evidence of adenopathy. Clear lungs.  No pleural  effusion or  pneumothorax. Skeletal structures are intact. IMPRESSION: No active cardiopulmonary disease. Electronically Signed   By: Lajean Manes M.D.   On: 03/26/2017 13:41   Ct Head Wo Contrast  Result Date: 03/26/2017 CLINICAL DATA:  Stuttering. Stroke follow-up. History of diabetes with CBG of 15. EXAM: CT ANGIOGRAPHY HEAD AND NECK TECHNIQUE: Multidetector CT imaging of the head and neck was performed using the standard protocol during bolus administration of intravenous contrast. Multiplanar CT image reconstructions and MIPs were obtained to evaluate the vascular anatomy. Carotid stenosis measurements (when applicable) are obtained utilizing NASCET criteria, using the distal internal carotid diameter as the denominator. CONTRAST:  181m ISOVUE-370 IOPAMIDOL (ISOVUE-370) INJECTION 76% COMPARISON:  Brain MRI 06/12/2016 FINDINGS: CT HEAD FINDINGS Brain: Moderate to advanced low-density in the cerebral white matter compatible with chronic small vessel ischemia. No acute hemorrhage, hydrocephalus, or masslike finding. Vascular: See below Skull: Negative Sinuses: Negative Orbits: Negative Review of the MIP images confirms the above findings CTA NECK FINDINGS Aortic arch: Mild atherosclerotic plaque. Left vertebral artery arises from the arch. Right carotid system: Atherosclerotic plaque along the common carotid artery. No flow limiting stenosis, ulceration, or beading. Left carotid system: Mild atherosclerotic plaque at the common carotid bifurcation. No flow limiting stenosis, ulceration, or beading. Vertebral arteries: Left dominant vertebral artery. Both vessels are smooth and diffusely patent to the dura. No proximal subclavian atherosclerosis. Skeleton: Degenerative changes without acute or aggressive finding. Other neck: Small area of stranding and subcutaneous gas in the right low posterior triangle, question attempted vascular access. Upper chest: Paraseptal emphysema, mild. Review of the MIP images confirms the  above findings CTA HEAD FINDINGS Anterior circulation: Atherosclerotic plaque on both carotid siphons. No flow limiting stenosis no major branch occlusion. Negative for aneurysm. Posterior circulation: Strong left vertebral artery dominance. The vertebral and basilar arteries are smooth and diffusely patent. Symmetric flow in bilateral PCAs. Hypoplastic left more than right P1 segments. Venous sinuses: Negative Anatomic variants: As above Delayed phase: No abnormal intracranial enhancement Review of the MIP images confirms the above findings IMPRESSION: 1. No acute finding. 2. Atherosclerosis without flow limiting stenosis or ulceration in the head or neck. 3. Extensive chronic small vessel ischemia in the cerebral white matter. 4. Small area of stranding and gas at the right neck base, question attempted vascular access. Electronically Signed   By: JMonte FantasiaM.D.   On: 03/26/2017 11:46   Ct Angio Neck W And/or Wo Contrast  Result Date: 03/26/2017 CLINICAL DATA:  Stuttering. Stroke follow-up. History of diabetes with CBG of 15. EXAM: CT ANGIOGRAPHY HEAD AND NECK TECHNIQUE: Multidetector CT imaging of the head and neck was performed using the standard protocol during bolus administration of intravenous contrast. Multiplanar CT image reconstructions and MIPs were obtained to evaluate the vascular anatomy. Carotid stenosis measurements (when applicable) are obtained utilizing NASCET criteria, using the distal internal carotid diameter as the denominator. CONTRAST:  1094mISOVUE-370 IOPAMIDOL (ISOVUE-370) INJECTION 76% COMPARISON:  Brain MRI 06/12/2016 FINDINGS: CT HEAD FINDINGS Brain: Moderate to advanced low-density in the cerebral white matter compatible with chronic small vessel ischemia. No acute hemorrhage, hydrocephalus, or masslike finding. Vascular: See below Skull: Negative Sinuses: Negative Orbits: Negative Review of the MIP images confirms the above findings CTA NECK FINDINGS Aortic arch: Mild  atherosclerotic plaque. Left vertebral artery arises from the arch. Right carotid system: Atherosclerotic plaque along the common carotid artery. No flow limiting stenosis, ulceration, or beading. Left carotid system: Mild atherosclerotic plaque at the common carotid bifurcation. No flow  limiting stenosis, ulceration, or beading. Vertebral arteries: Left dominant vertebral artery. Both vessels are smooth and diffusely patent to the dura. No proximal subclavian atherosclerosis. Skeleton: Degenerative changes without acute or aggressive finding. Other neck: Small area of stranding and subcutaneous gas in the right low posterior triangle, question attempted vascular access. Upper chest: Paraseptal emphysema, mild. Review of the MIP images confirms the above findings CTA HEAD FINDINGS Anterior circulation: Atherosclerotic plaque on both carotid siphons. No flow limiting stenosis no major branch occlusion. Negative for aneurysm. Posterior circulation: Strong left vertebral artery dominance. The vertebral and basilar arteries are smooth and diffusely patent. Symmetric flow in bilateral PCAs. Hypoplastic left more than right P1 segments. Venous sinuses: Negative Anatomic variants: As above Delayed phase: No abnormal intracranial enhancement Review of the MIP images confirms the above findings IMPRESSION: 1. No acute finding. 2. Atherosclerosis without flow limiting stenosis or ulceration in the head or neck. 3. Extensive chronic small vessel ischemia in the cerebral white matter. 4. Small area of stranding and gas at the right neck base, question attempted vascular access. Electronically Signed   By: Monte Fantasia M.D.   On: 03/26/2017 11:46   Mr Brain Wo Contrast  Result Date: 03/26/2017 CLINICAL DATA:  Altered level of consciousness, hypoglycemia. History of bipolar disorder, diabetes and seizures. EXAM: MRI HEAD WITHOUT CONTRAST TECHNIQUE: Multiplanar, multiecho pulse sequences of the brain and surrounding  structures were obtained without intravenous contrast. COMPARISON:  CT HEAD March 26, 2017 and MRI of the head Jun 12, 2016 FINDINGS: Multiple sequences are moderately motion degraded. INTRACRANIAL CONTENTS: No reduced diffusion to suggest acute ischemia. No susceptibility artifact to suggest hemorrhage. Mild parenchymal brain volume loss for age. No hydrocephalus. Patchy to confluent supratentorial white matter FLAIR T2 hyperintensities. Old bilateral thalamus lacunar infarcts. No suspicious parenchymal signal, masses, mass effect. No abnormal extra-axial fluid collections. No extra-axial masses. VASCULAR: Normal major intracranial vascular flow voids present at skull base. SKULL AND UPPER CERVICAL SPINE: No abnormal sellar expansion. No suspicious calvarial bone marrow signal. Craniocervical junction maintained. SINUSES/ORBITS: Mild paranasal sinus mucosal thickening. Small bilateral mastoid effusions.The included ocular globes and orbital contents are non-suspicious. OTHER: Patient is edentulous. IMPRESSION: 1. No acute intracranial process on this motion degraded examination. 2. Moderate to severe chronic small vessel ischemic disease and old bilateral thalamus lacunar infarcts. 3. Mild parenchymal brain volume loss. Electronically Signed   By: Elon Alas M.D.   On: 03/26/2017 19:11    Micro Results   Recent Results (from the past 240 hour(s))  Urine culture     Status: None   Collection Time: 03/26/17  1:11 PM  Result Value Ref Range Status   Specimen Description   Final    URINE, RANDOM Performed at [facility] [address], [place], [place] [address]    Special Requests   Final    NONE Performed at [facility], [place] [address] [place], [place] [address]    Culture   Final    NO GROWTH Performed at [facility], [place] [address], [place],  [address]    Report Status 03/27/2017 FINAL  Final  MRSA PCR Screening     Status: None    Collection Time: 03/26/17 10:00 PM  Result Value Ref Range Status   MRSA by PCR NEGATIVE NEGATIVE Final    Comment:        The GeneXpert MRSA Assay (FDA approved for NASAL specimens only), is one component of a comprehensive MRSA colonization surveillance  program. It is not intended to diagnose MRSA infection nor to guide or monitor treatment for MRSA infections. Performed at [facility], [place] [address], [place], [place] [address]        Today   Subjective    Michael [name] today has no new concerns, ambulating the hallways with walker, eager to go home          Patient has been seen and examined prior to discharge   Objective   Blood pressure 135/79, pulse 86, temperature 98.7 F (37.1 C), temperature source Oral, resp. rate 18, height 5' 4"  (1.626 m), weight 63.2 kg (139 lb 5.3 oz), SpO2 100 %.   Intake/Output Summary (Last 24 hours) at 03/30/2017 1512 Last data filed at 03/30/2017 0800 Gross per 24 hour  Intake 600 ml  Output -  Net 600 ml    Exam Gen:- Awake Alert,  In no apparent distress , emanciated HEENT:- .AT, No sclera icterus Neck-Supple Neck,No JVD,.  Lungs-  CTAB  CV- S1, S2 normal Abd-  +ve B.Sounds, Abd Soft, No tenderness,    Extremity/Skin:- No  edema,    Psych-affect is appropriate, oriented x3 Neuro-no new focal deficits, ambulates with a walker   Data Review   CBC w Diff:  Lab Results  Component Value Date   WBC 10.1 03/27/2017   HGB 14.4 03/27/2017   HGB 15.1 10/13/2016   HCT 41.5 03/27/2017   HCT 46.4 10/13/2016   PLT 185 03/27/2017   PLT 189 10/13/2016   LYMPHOPCT 13 03/26/2017   MONOPCT 5 03/26/2017   EOSPCT 0 03/26/2017   BASOPCT 0 03/26/2017    CMP:  Lab Results  Component Value Date   NA 139 03/29/2017   NA 140 10/13/2016   K 3.9 03/29/2017   CL 105 03/29/2017   CO2 28 03/29/2017   BUN 8 03/29/2017   BUN 11 10/13/2016   CREATININE 0.69 03/29/2017   CREATININE 0.61 08/02/2011   PROT 5.7 (L)  03/29/2017   ALBUMIN 3.3 (L) 03/29/2017   BILITOT 0.9 03/29/2017   ALKPHOS 263 (H) 03/29/2017   AST 84 (H) 03/29/2017   ALT 89 (H) 03/29/2017  .   Total Discharge time is about 33 minutes  Roxan Hockey M.D on 03/30/2017 at 3:12 PM  [facility]   Office  [phone]  Voice Recognition Viviann Spare dictation system was used to create this note, attempts have been made to correct errors. Please contact the author with questions and/or clarifications.

## 2017-04-24 ENCOUNTER — Other Ambulatory Visit: Payer: Self-pay | Admitting: Internal Medicine

## 2017-04-24 DIAGNOSIS — E1142 Type 2 diabetes mellitus with diabetic polyneuropathy: Secondary | ICD-10-CM

## 2017-05-10 ENCOUNTER — Other Ambulatory Visit: Payer: Self-pay | Admitting: Internal Medicine

## 2017-05-10 DIAGNOSIS — E1142 Type 2 diabetes mellitus with diabetic polyneuropathy: Secondary | ICD-10-CM

## 2017-05-18 ENCOUNTER — Ambulatory Visit: Payer: 59 | Admitting: Physical Therapy

## 2017-05-22 ENCOUNTER — Other Ambulatory Visit: Payer: Self-pay | Admitting: Internal Medicine

## 2017-05-28 ENCOUNTER — Other Ambulatory Visit: Payer: Self-pay | Admitting: Internal Medicine

## 2017-05-28 ENCOUNTER — Ambulatory Visit: Payer: 59 | Attending: Nurse Practitioner | Admitting: Physical Therapy

## 2017-05-28 DIAGNOSIS — E1142 Type 2 diabetes mellitus with diabetic polyneuropathy: Secondary | ICD-10-CM

## 2017-06-01 ENCOUNTER — Ambulatory Visit: Payer: 59 | Admitting: Physical Therapy

## 2017-06-05 ENCOUNTER — Other Ambulatory Visit: Payer: Self-pay | Admitting: Internal Medicine

## 2017-06-26 ENCOUNTER — Other Ambulatory Visit: Payer: Self-pay | Admitting: Internal Medicine

## 2017-07-04 ENCOUNTER — Other Ambulatory Visit: Payer: Self-pay | Admitting: Internal Medicine

## 2017-07-11 ENCOUNTER — Other Ambulatory Visit: Payer: Self-pay | Admitting: Internal Medicine

## 2017-07-12 ENCOUNTER — Other Ambulatory Visit: Payer: Self-pay | Admitting: Internal Medicine

## 2017-07-12 DIAGNOSIS — E1142 Type 2 diabetes mellitus with diabetic polyneuropathy: Secondary | ICD-10-CM

## 2017-08-07 ENCOUNTER — Other Ambulatory Visit: Payer: Self-pay | Admitting: Internal Medicine

## 2017-08-10 ENCOUNTER — Other Ambulatory Visit: Payer: Self-pay | Admitting: Internal Medicine

## 2017-08-10 DIAGNOSIS — E1142 Type 2 diabetes mellitus with diabetic polyneuropathy: Secondary | ICD-10-CM

## 2017-09-14 ENCOUNTER — Ambulatory Visit: Payer: Medicaid Other | Admitting: Podiatry

## 2017-10-10 ENCOUNTER — Ambulatory Visit (INDEPENDENT_AMBULATORY_CARE_PROVIDER_SITE_OTHER): Payer: Medicare HMO | Admitting: Podiatry

## 2017-10-10 ENCOUNTER — Encounter: Payer: Self-pay | Admitting: Podiatry

## 2017-10-10 DIAGNOSIS — M79674 Pain in right toe(s): Secondary | ICD-10-CM

## 2017-10-10 DIAGNOSIS — E1149 Type 2 diabetes mellitus with other diabetic neurological complication: Secondary | ICD-10-CM

## 2017-10-10 DIAGNOSIS — M79675 Pain in left toe(s): Secondary | ICD-10-CM

## 2017-10-10 DIAGNOSIS — B351 Tinea unguium: Secondary | ICD-10-CM

## 2017-10-10 MED ORDER — AMMONIUM LACTATE 12 % EX CREA: TOPICAL_CREAM | CUTANEOUS | 0 refills | Status: DC | PRN

## 2017-10-10 NOTE — Progress Notes (Signed)
Patient ID: Michael [name], male   DOB: 17-Mar-1955, 62 y.o.   MRN: [id number] Complaint:  Visit Type: Patient returns to my office for continued preventative foot care services. Complaint: Patient states" my nails have grown long and thick and become painful to walk and wear shoes" Patient has been diagnosed with DM with neuropathy. Patient has not been seen in over 14 months.  The patient presents for preventative foot care services. No changes to ROS.    Podiatric Exam: Vascular: dorsalis pedis and posterior tibial pulses are palpable bilateral. Capillary return is immediate. Temperature gradient is WNL. Skin turgor WNL  Sensorium: Decreased  Semmes Weinstein monofilament test. Normal tactile sensation bilaterally. Nail Exam: Pt has thick disfigured discolored nails with subungual debris noted bilateral entire nail hallux through fifth toenails Ulcer Exam: There is no evidence of ulcer or pre-ulcerative changes or infection. Orthopedic Exam: Muscle tone and strength are WNL. No limitations in general ROM. No crepitus or effusions noted. Foot type and digits show no abnormalities. Bony prominences are unremarkable. Skin: No Porokeratosis. No infection or ulcers.  Plantar skin xerosis.  Diagnosis:  Onychomycosis, , Pain in right toe, pain in left toes  Treatment & Plan Procedures and Treatment: Consent by patient was obtained for treatment procedures. The patient understood the discussion of treatment and procedures well. All questions were answered thoroughly reviewed. Debridement of mycotic and hypertrophic toenails, 1 through 5 bilateral and clearing of subungual debris. No ulceration, no infection noted. . Prescribe lac-hydrin. Return Visit-Office Procedure: Patient instructed to return to the office for a follow up visit 3 months for continued evaluation and treatment.    Gardiner Barefoot DPM

## 2017-10-10 NOTE — Addendum Note (Signed)
Addended byDeidre Ala, Jevaeh Shams L on: 10/10/2017 10:26 AM   Modules accepted: Orders

## 2017-12-29 ENCOUNTER — Emergency Department (HOSPITAL_COMMUNITY)
Admission: EM | Admit: 2017-12-29 | Discharge: 2017-12-29 | Disposition: A | Discharge status: Home / Self Care | Payer: Medicare HMO | Attending: Emergency Medicine | Admitting: Emergency Medicine

## 2017-12-29 ENCOUNTER — Encounter (HOSPITAL_COMMUNITY): Payer: Self-pay

## 2017-12-29 ENCOUNTER — Other Ambulatory Visit: Payer: Self-pay

## 2017-12-29 DIAGNOSIS — R45850 Homicidal ideations: Secondary | ICD-10-CM

## 2017-12-29 DIAGNOSIS — F10120 Alcohol abuse with intoxication, uncomplicated: Secondary | ICD-10-CM | POA: Insufficient documentation

## 2017-12-29 DIAGNOSIS — R569 Unspecified convulsions: Secondary | ICD-10-CM | POA: Diagnosis not present

## 2017-12-29 DIAGNOSIS — F319 Bipolar disorder, unspecified: Secondary | ICD-10-CM | POA: Diagnosis not present

## 2017-12-29 DIAGNOSIS — Z794 Long term (current) use of insulin: Secondary | ICD-10-CM | POA: Diagnosis not present

## 2017-12-29 DIAGNOSIS — F10920 Alcohol use, unspecified with intoxication, uncomplicated: Secondary | ICD-10-CM | POA: Diagnosis not present

## 2017-12-29 DIAGNOSIS — E1142 Type 2 diabetes mellitus with diabetic polyneuropathy: Secondary | ICD-10-CM | POA: Insufficient documentation

## 2017-12-29 DIAGNOSIS — K861 Other chronic pancreatitis: Secondary | ICD-10-CM | POA: Insufficient documentation

## 2017-12-29 DIAGNOSIS — E1151 Type 2 diabetes mellitus with diabetic peripheral angiopathy without gangrene: Secondary | ICD-10-CM | POA: Diagnosis not present

## 2017-12-29 DIAGNOSIS — F1020 Alcohol dependence, uncomplicated: Secondary | ICD-10-CM | POA: Diagnosis present

## 2017-12-29 DIAGNOSIS — F209 Schizophrenia, unspecified: Secondary | ICD-10-CM | POA: Diagnosis not present

## 2017-12-29 DIAGNOSIS — R440 Auditory hallucinations: Secondary | ICD-10-CM | POA: Diagnosis present

## 2017-12-29 DIAGNOSIS — Z7982 Long term (current) use of aspirin: Secondary | ICD-10-CM | POA: Diagnosis not present

## 2017-12-29 DIAGNOSIS — Z79899 Other long term (current) drug therapy: Secondary | ICD-10-CM | POA: Diagnosis not present

## 2017-12-29 DIAGNOSIS — F17210 Nicotine dependence, cigarettes, uncomplicated: Secondary | ICD-10-CM | POA: Insufficient documentation

## 2017-12-29 LAB — COMPREHENSIVE METABOLIC PANEL
ALT: 64 U/L — ABNORMAL HIGH (ref 0–44)
AST: 52 U/L — ABNORMAL HIGH (ref 15–41)
Albumin: 4 g/dL (ref 3.5–5.0)
Alkaline Phosphatase: 187 U/L — ABNORMAL HIGH (ref 38–126)
Anion gap: 11 (ref 5–15)
BUN: 9 mg/dL (ref 6–20)
CO2: 23 mmol/L (ref 22–32)
Calcium: 8.5 mg/dL — ABNORMAL LOW (ref 8.9–10.3)
Chloride: 113 mmol/L — ABNORMAL HIGH (ref 98–111)
Creatinine, Ser: 0.74 mg/dL (ref 0.61–1.24)
GFR calc Af Amer: >60 mL/min (ref >60)
Glucose, Bld: 222 mg/dL — ABNORMAL HIGH (ref 70–99)
Potassium: 3.7 mmol/L (ref 3.5–5.1)
Sodium: 147 mmol/L — ABNORMAL HIGH (ref 135–145)
TOTAL PROTEIN: 7 g/dL (ref 6.5–8.1)
Total Bilirubin: 0.9 mg/dL (ref 0.3–1.2)

## 2017-12-29 LAB — CBG MONITORING, ED
GLUCOSE-CAPILLARY: 170 mg/dL — AB (ref 70–99)
Glucose-Capillary: 45 mg/dL — ABNORMAL LOW (ref 70–99)
Glucose-Capillary: 70 mg/dL (ref 70–99)
Glucose-Capillary: 217 mg/dL — ABNORMAL HIGH (ref 70–99)

## 2017-12-29 LAB — RAPID URINE DRUG SCREEN, HOSP PERFORMED
AMPHETAMINES: NOT DETECTED
Barbiturates: NOT DETECTED
Benzodiazepines: NOT DETECTED
Cocaine: NOT DETECTED
Opiates: NOT DETECTED
Tetrahydrocannabinol: NOT DETECTED

## 2017-12-29 LAB — CBC
HCT: 48.1 % (ref 39.0–52.0)
Hemoglobin: 16.4 g/dL (ref 13.0–17.0)
MCH: 32.4 pg (ref 26.0–34.0)
MCHC: 34.1 g/dL (ref 30.0–36.0)
MCV: 95.1 fL (ref 80.0–100.0)
NRBC: 0 % (ref 0.0–0.2)
Platelets: 178 10*3/uL (ref 150–400)
RBC: 5.06 MIL/uL (ref 4.22–5.81)
RDW: 13.8 % (ref 11.5–15.5)
WBC: 6.5 10*3/uL (ref 4.0–10.5)

## 2017-12-29 LAB — ETHANOL: ALCOHOL ETHYL (B): 232 mg/dL — AB (ref <10)

## 2017-12-29 LAB — SALICYLATE LEVEL: Salicylate Lvl: <7 mg/dL (ref 2.8–30.0)

## 2017-12-29 LAB — ACETAMINOPHEN LEVEL: Acetaminophen (Tylenol), Serum: <10 ug/mL — ABNORMAL LOW (ref 10–30)

## 2017-12-29 MED ORDER — QUETIAPINE FUMARATE ER 50 MG PO TB24
150.0000 mg | ORAL_TABLET | Freq: Every day | ORAL | Status: DC
  Administered 2017-12-29: 150 mg via ORAL
  Filled 2017-12-29: qty 3

## 2017-12-29 MED ORDER — DULOXETINE HCL 30 MG PO CPEP
60.0000 mg | ORAL_CAPSULE | Freq: Every day | ORAL | Status: DC
  Administered 2017-12-29: 60 mg via ORAL
  Filled 2017-12-29: qty 2

## 2017-12-29 MED ORDER — IBUPROFEN 200 MG PO TABS: 600.0000 mg | ORAL_TABLET | Freq: Three times a day (TID) | ORAL | Status: DC | PRN

## 2017-12-29 MED ORDER — HYDROXYZINE HCL 10 MG PO TABS: 10.0000 mg | ORAL_TABLET | Freq: Three times a day (TID) | ORAL | Status: DC | PRN

## 2017-12-29 MED ORDER — GABAPENTIN 300 MG PO CAPS
900.0000 mg | ORAL_CAPSULE | ORAL | Status: DC
  Filled 2017-12-29: qty 3

## 2017-12-29 MED ORDER — GLIPIZIDE 10 MG PO TABS
10.0000 mg | ORAL_TABLET | Freq: Every day | ORAL | Status: DC
  Administered 2017-12-29: 10 mg via ORAL
  Filled 2017-12-29: qty 1

## 2017-12-29 MED ORDER — VITAMIN B-1 100 MG PO TABS
100.0000 mg | ORAL_TABLET | Freq: Every day | ORAL | Status: DC
  Administered 2017-12-29: 100 mg via ORAL
  Filled 2017-12-29: qty 1

## 2017-12-29 MED ORDER — INSULIN ASPART 100 UNIT/ML ~~LOC~~ SOLN
5.0000 [IU] | Freq: Three times a day (TID) | SUBCUTANEOUS | Status: DC
  Administered 2017-12-29: 5 [IU] via SUBCUTANEOUS
  Filled 2017-12-29: qty 1

## 2017-12-29 MED ORDER — GABAPENTIN 400 MG PO CAPS: 1200.0000 mg | ORAL_CAPSULE | Freq: Every day | ORAL | Status: DC

## 2017-12-29 MED ORDER — ARIPIPRAZOLE 5 MG PO TABS
5.0000 mg | ORAL_TABLET | Freq: Every day | ORAL | Status: DC
  Administered 2017-12-29: 5 mg via ORAL
  Filled 2017-12-29: qty 1

## 2017-12-29 MED ORDER — GABAPENTIN 300 MG PO CAPS: 900.0000 mg | ORAL_CAPSULE | Freq: Three times a day (TID) | ORAL | Status: DC

## 2017-12-29 MED ORDER — FOLIC ACID 1 MG PO TABS
1.0000 mg | ORAL_TABLET | Freq: Every day | ORAL | Status: DC
  Administered 2017-12-29: 1 mg via ORAL
  Filled 2017-12-29: qty 1

## 2017-12-29 MED ORDER — GABAPENTIN 300 MG PO CAPS
300.0000 mg | ORAL_CAPSULE | ORAL | Status: DC
  Administered 2017-12-29: 300 mg via ORAL

## 2017-12-29 MED ORDER — PANCRELIPASE (LIP-PROT-AMYL) 36000-114000 UNITS PO CPEP
36000.0000 [IU] | ORAL_CAPSULE | Freq: Three times a day (TID) | ORAL | Status: DC
  Administered 2017-12-29 (×2): 36000 [IU] via ORAL
  Filled 2017-12-29 (×4): qty 1

## 2017-12-29 NOTE — ED Notes (Signed)
Pt has 2 pt belonging bags in the cabinet behind 16 -18 nursing station.

## 2017-12-29 NOTE — ED Notes (Signed)
Patient given another diet sprite

## 2017-12-29 NOTE — ED Notes (Signed)
Pt alert and cooperative. Pt denies any pain at this time. Pt denies Si and AVH. Pt states " I'm homicidal but I wont hurt you". Pt contracts to safety. Pt answers some questions, some questions he refuses. Pt given blankets and a cup of soda. Pt safe, will continue to monitor.

## 2017-12-29 NOTE — Patient Outreach (Signed)
CPSS met with the patient to provide substance use recovery support and help with recovery resources. Patient wants help with his alcohol use. Patient states that he drinks a couple of beers a day. CPSS provided the patient with an outpatient/residential substance use treatment center list, AA meeting list, and CPSS contact information. CPSS strongly encouraged the patient to stay in contact with CPSS for further substance use recovery support and help with getting connected to recovery treatment resources after discharge from the [facility].  

## 2017-12-29 NOTE — B[facility] Suicide Risk Assessment (Cosign Needed)
Suicide Risk Assessment  Discharge Assessment   [facility] Discharge Suicide Risk Assessment   Principal Problem: Alcohol use disorder, severe, dependence ([place]) Discharge Diagnoses: Principal Problem:   Alcohol use disorder, severe, dependence ([place])   Total Time spent with patient: 30 minutes  Musculoskeletal: Strength & Muscle Tone: within normal limits Gait & Station: normal Patient leans: N/A  Psychiatric Specialty Exam:   Blood pressure 120/71, pulse 93, temperature 98.7 F (37.1 C), temperature source Oral, resp. rate 18, SpO2 93 %.There is no height or weight on file to calculate BMI.  General Appearance: Casual  Eye Contact::  Fair  Speech:  Clear and Coherent and Normal Rate409  Volume:  Normal  Mood:  Angry, Anxious and Irritable  Affect:  Congruent  Thought Process:  Coherent, Goal Directed, Linear and Descriptions of Associations: Intact  Orientation:  Full (Time, Place, and Person)  Thought Content:  Logical  Suicidal Thoughts:  No  Homicidal Thoughts:  No  Memory:  Immediate;   Fair Recent;   Fair Remote;   Fair  Judgement:  Fair  Insight:  Fair  Psychomotor Activity:  Normal  Concentration:  Fair  Recall:  [employer] of Knowledge:Good  Language: Good  Akathisia:  No  Handed:  Right  AIMS (if indicated):     Assets:  Agricultural consultant Housing Social Support  Sleep:     Cognition: WNL  ADL's:  Intact   Mental Status Per Nursing Assessment::   On Admission:   Pt presented to [facility] intoxicated with a BAL of 232, UDS negative. Pt stated he drinks because he is dealing with stress in his life but would not specify what his stressors were. Pt is followed by PCP at [facility]. He denies any medications for depression or anxiety. Pt also denies having a mental health diagnosis. Pt will be referred to [facility] for follow up and evaluation, therapy if needed and medication management if needed. Pt will be seen by Peer Support  for substance abuse assistance and resources in the community.   Demographic Factors:  Male, Low socioeconomic status and Unemployed  Loss Factors: Financial problems/change in socioeconomic status  Historical Factors: Family history of mental illness or substance abuse  Risk Reduction Factors:   Sense of responsibility to family  Continued Clinical Symptoms:  Alcohol/Substance Abuse/Dependencies  Cognitive Features That Contribute To Risk:  Closed-mindedness    Suicide Risk:  Minimal: No identifiable suicidal ideation.  Patients presenting with no risk factors but with morbid ruminations; may be classified as minimal risk based on the severity of the depressive symptoms    Plan Of Care/Follow-up recommendations:  Pt is psychiatrically clear for discharge. See note above.  Activity:  as tolerated Diet:  Heart healthy  Ethelene Hal, NP 12/29/2017, 2:22 PM

## 2017-12-29 NOTE — ED Notes (Signed)
Pt CBG obtained, 45 mg/dl. Pt asymptomatic. Voicing no complaints, OJ provided. Will recheck.

## 2017-12-29 NOTE — ED Notes (Signed)
ED Provider at bedside. Patient ate a [place] sandwich and drank a diet sprite.

## 2017-12-29 NOTE — Discharge Instructions (Signed)
For your mental health needs, please follow up at:  [facility] [address] [place], [place] [address] [phone]

## 2017-12-29 NOTE — ED Provider Notes (Signed)
[facility] DEPT Provider Note  CSN: [id number] Arrival date & time: 12/29/17 0147  Chief Complaint(s) Hearing Voices; Homicidal; and Alcohol Intoxication  HPI Michael [name] is a 62 y.o. male with extensive past medical history listed below including schizophrenia who presents to the emergency department for alcohol intoxication, homicidal ideations, auditory hallucinations.  Patient was brought in by PD, who found the patient intoxicated.  Apparently there was an attempt to take the patient to [facility] by EMS however they declined due to his need for insulin.  Patient reports that his homicidal ideations are towards his family.  When asked exactly who and his family he was looking to hurt, the patient could not give me specific information.  States that he has a lot of anger and cannot deal with it anymore.  He states that he has not been on his medications due to lack of access.  Does endorse auditory hallucinations but denies hearing any voices at this time.  Denies any suicidal ideations.  Denies any physical complaints.  HPI  Past Medical History Past Medical History:  Diagnosis Date  . Anxiety   . Arthritis    "joints; shoulders; feet" (06/08/2016)  . Bipolar disorder ([place])   . Daily headache    "7:30 - 8:00 q night" (06/08/2016)  . Diabetic peripheral neuropathy ([place])   . GERD (gastroesophageal reflux disease)   . Pancreatitis   . Schizophrenia ([place])   . Seizures ([place]) 01/2016 X 2  . Type II diabetes mellitus [facility])    Patient Active Problem List   Diagnosis Date Noted  . Malnutrition of moderate degree 03/30/2017  . Hypoglycemia 03/26/2017  . Acute metabolic encephalopathy 14/48/1856  . Other constipation 10/16/2016  . Recurrent falls 07/07/2016  . Wernicke's encephalopathy   . Altered mental status 06/09/2016  . Bipolar disorder ([place]) 06/01/2016  . Dry skin 01/18/2016  . Peripheral vascular disease ([place]) 11/15/2015  . Hyperglycemia  09/02/2015  . Tobacco abuse 09/02/2015  . Tricompartment osteoarthritis of both knees 06/15/2015  . Vitamin D deficiency 03/12/2015  . Hyperlipidemia 02/17/2015  . Protein-calorie malnutrition, severe 11/18/2014  . Alcohol use disorder   . Cervical arthritis 01/01/2014  . Health care maintenance 08/25/2011  . Chronic alcoholic pancreatitis ([place]) 04/20/2011  . Type 2 diabetes mellitus with peripheral neuropathy ([place]) 12/09/2010  . HTN (hypertension) 12/09/2010   Home Medication(s) Prior to Admission medications   Medication Sig Start Date End Date Taking? Authorizing Provider  ACCU-CHEK SOFTCLIX LANCETS lancets Use 3 times daily to check blood sugar. diag code E11.42. Insulin dependent 03/30/17   Roxan Hockey, MD  Alcohol Swabs (B-D SINGLE USE SWABS REGULAR) PADS Patient uses 6 times a day. Patient tests 3 times per day and administers insulin 3 times a day. ICD 10 code E11.42 03/30/17   Roxan Hockey, MD  ammonium lactate (LAC-HYDRIN) 12 % cream Apply topically as needed for dry skin. 10/10/17   Gardiner Barefoot, DPM  ARIPiprazole (ABILIFY) 5 MG tablet Take 1 tablet (5 mg total) by mouth daily. 03/30/17   Roxan Hockey, MD  aspirin EC 81 MG tablet Take by mouth.    [provider]  BELBUCA 75 MCG FILM  09/07/17   [provider]  Blood Glucose Calibration (ACCU-CHEK AVIVA) SOLN 1 drop by In Vitro route as needed. diag code E08.10 insulin dependent. Use as device directs 03/30/17   Roxan Hockey, MD  Blood Glucose Monitoring Suppl (ACCU-CHEK AVIVA PLUS) w/Device KIT 1 kit by Does not apply route every morning.  diag code E11.42. Insulin dependent 03/30/17   Roxan Hockey, MD  Blood Pressure Monitoring (WRIST BLOOD PRESSURE MONITOR) MISC  09/04/17   [provider]  buprenorphine (BUTRANS - DOSED MCG/HR) 10 MCG/HR PTWK patch  07/11/17   [provider]  desoximetasone (TOPICORT) 0.05 % cream Apply topically. 07/05/17 07/05/18  [provider]    diclofenac sodium (VOLTAREN) 1 % GEL Apply 2 g topically 4 (four) times daily -  before meals and at bedtime. 01/05/17   Collier Salina, MD  Disposable Gloves (LATEX GLOVES MEDIUM) MISC  09/04/17   [provider]  DULoxetine (CYMBALTA) 60 MG capsule Take 1 capsule (60 mg total) by mouth daily. 03/30/17   Roxan Hockey, MD  Elastic Bandages & Supports (KNEE SUPPORT ADJUSTABLE) [place]  09/04/17   [provider]  feeding supplement, GLUCERNA SHAKE, (GLUCERNA SHAKE) LIQD Take 237 mLs by mouth 3 (three) times daily between meals. 03/30/17   Roxan Hockey, MD  folic acid (FOLVITE) 1 MG tablet Take 1 tablet (1 mg total) by mouth daily. 03/30/17   Roxan Hockey, MD  Foot Care Products (FOOT POWDER MEDICATED EX)  09/04/17   [provider]  gabapentin (NEURONTIN) 300 MG capsule TAKE 3 CAPSULES BY MOUTH  EVERY MORNING AND  AFTERNOON. TAKE 4 CAPSULES  BY MOUTH AT NIGHT BEFORE  BED Patient taking differently: Take 900-1,200 mg by mouth 3 (three) times daily. TAKE 3 CAPSULES BY MOUTH  EVERY MORNING AND  AFTERNOON. TAKE 4 CAPSULES  BY MOUTH AT NIGHT BEFORE  BED 01/05/17   Collier Salina, MD  glipiZIDE (GLUCOTROL) 10 MG tablet Take by mouth.    [provider]  glucose blood (ACCU-CHEK AVIVA PLUS) test strip Use 3 times daily to check blood sugar. diag code E11.42. Insulin dependent 03/30/17   Roxan Hockey, MD  hydrOXYzine (ATARAX/VISTARIL) 10 MG tablet TAKE ONE TABLET EVERY 8  HOURS AS NEEDED FOR ANXIETY. 09/26/17   [provider]  ibuprofen (ADVIL,MOTRIN) 800 MG tablet TAKE 1 TABLET THREE TIMES DAILY as needed 09/17/17   [provider]  Incontinence Supply Disposable (CLEANSING CLOTHS FLUSHABLE) MISC  09/04/17   [provider]  insulin aspart (NOVOLOG) 100 UNIT/ML FlexPen Take 5 units before breakfast,   before lunch, and 5 units before dinner (5 units before each meal) 03/30/17   Emokpae, Courage, MD  LEVEMIR FLEXTOUCH 100 UNIT/ML Pen 4  units Twice a day 03/30/17   Roxan Hockey, MD  lipase/protease/amylase (CREON) 36000 UNITS CPEP capsule TAKE 1 CAPSULE BY MOUTH 3  TIMES DAILY BEFORE MEALS 01/05/17   Rice, Resa Miner, MD  Menthol-Methyl Salicylate (MUSCLE RUB EX)  09/04/17   [provider]  mometasone (NASONEX) 50 MCG/ACT nasal spray two sprays by Both Nostrils route daily. 07/05/17 07/05/18  [provider]  Multiple Vitamin (MULTIVITAMIN WITH MINERALS) TABS tablet Take 1 tablet by mouth daily. 03/30/17   Roxan Hockey, MD  naloxone [facility]) nasal spray 4 mg/0.1 mL as needed. 07/13/17   [provider]  Neomycin-Bacitracin-Polymyxin ([place])  09/04/17   [provider]  omega-3 acid ethyl esters (LOVAZA) 1 g capsule Take 1 capsule (1 g total) by mouth daily. 10/15/16   Sid Falcon, MD  oxyCODONE-acetaminophen (PERCOCET/ROXICET) 5-325 MG tablet TK 1 T PO  Q 12 H PRN 10/04/17   [provider]  pantoprazole (PROTONIX) 40 MG tablet Take 1 tablet (40 mg total) by mouth daily. 03/30/17   Roxan Hockey, MD  Plastic Adhesive Bandages  [place] 200ct 09/04/17   [provider]  pravastatin (PRAVACHOL) 10 MG tablet Take 1 tablet (10 mg total) by mouth daily at 6 PM. 01/05/17   Rice, Resa Miner, MD  QUEtiapine Fumarate (SEROQUEL XR) 150 MG 24 hr tablet Take by mouth. 07/20/17   [provider]  SURE COMFORT PEN NEEDLES 32G X 4 MM MISC See admin instructions. use with insulin pen 10/01/17   [provider]  thiamine (VITAMIN B-1) 100 MG tablet Take 1 tablet (100 mg total) by mouth daily. 03/30/17   Roxan Hockey, MD  UNABLE TO FIND DIGITAL BLOOD PRESSURE MONITOR 09/04/17   [provider]  UNABLE TO FIND  09/04/17   [provider]                                                                                                                                    Past Surgical History Past Surgical History:  Procedure Laterality  Date  . BALLOON DILATION  03/22/2011   Procedure: BALLOON DILATION;  Surgeon: Gatha Mayer, MD;  Location: [facility];  Service: Endoscopy;  Laterality: N/A;  . COLONOSCOPY N/A 05/22/2012   Procedure: COLONOSCOPY;  Surgeon: Gatha Mayer, MD;  Location: [facility];  Service: Endoscopy;  Laterality: N/A;  . COLONOSCOPY W/ BIOPSIES AND POLYPECTOMY  09/12/11  . ESOPHAGOGASTRODUODENOSCOPY  03/22/2011   Procedure: ESOPHAGOGASTRODUODENOSCOPY (EGD);  Surgeon: Gatha Mayer, MD;  Location: [facility];  Service: Endoscopy;  Laterality: N/A;  egd with balloon   . EUS  04/20/2011   Procedure: UPPER ENDOSCOPIC ULTRASOUND (EUS) LINEAR;  Surgeon: Milus Banister, MD;  Location[facility];  Service: Endoscopy;  Laterality: N/A;  . KNEE ARTHROSCOPY Left    Family History Family History  Problem Relation Age of Onset  . Hypertension Sister   . Diabetes Sister   . Heart disease Sister   . Colon cancer Neg Hx   . Stomach cancer Neg Hx   . Anesthesia problems Neg Hx   . Hypotension Neg Hx   . Pseudochol deficiency Neg Hx   . Malignant hyperthermia Neg Hx     Social History Social History   Tobacco Use  . Smoking status: Current Every Day Smoker    Packs/day: 0.50    Years: 42.00    Pack years: 21.00    Types: Cigarettes  . Smokeless tobacco: Never Used  . Tobacco comment: Sometimes less.  Substance Use Topics  . Alcohol use: Yes    Alcohol/week: 60.0 standard drinks    Types: 50 Shots of liquor, 10 Standard drinks or equivalent per week    Comment: Occasionally.  . Drug use: Yes    Types: Marijuana    Comment: Sometimes.   Allergies Ace inhibitors; Losartan; and Tylenol [acetaminophen]  Review of Systems Review of Systems All other systems are reviewed and are negative for acute change except as noted in the HPI  Physical Exam Vital  Signs  I have reviewed the triage vital signs BP 132/88   Pulse 88   Temp 98 F (36.7 C) (Oral)   Resp 18   SpO2 99%   Physical Exam    Constitutional: He is oriented to person, place, and time. He appears well-developed and well-nourished. No distress.  HENT:  Head: Normocephalic and atraumatic.  Right Ear: External ear normal.  Left Ear: External ear normal.  Nose: Nose normal.  Mouth/Throat: Mucous membranes are normal. No trismus in the jaw.  Eyes: Conjunctivae and EOM are normal. No scleral icterus.  Neck: Normal range of motion and phonation normal.  Cardiovascular: Normal rate and regular rhythm.  Pulmonary/Chest: Effort normal. No stridor. No respiratory distress.  Abdominal: He exhibits no distension.  Musculoskeletal: Normal range of motion. He exhibits no edema.  Neurological: He is alert and oriented to person, place, and time.  Skin: He is not diaphoretic.  Psychiatric: He has a normal mood and affect. His behavior is normal.  Vitals reviewed.   ED Results and Treatments Labs (all labs ordered are listed, but only abnormal results are displayed) Labs Reviewed  COMPREHENSIVE METABOLIC PANEL - Abnormal; Notable for the following components:      Result Value   Sodium 147 (*)    Chloride 113 (*)    Glucose, Bld 222 (*)    Calcium 8.5 (*)    AST 52 (*)    ALT 64 (*)    Alkaline Phosphatase 187 (*)    All other components within normal limits  ETHANOL - Abnormal; Notable for the following components:   Alcohol, Ethyl (B) 232 (*)    All other components within normal limits  ACETAMINOPHEN LEVEL - Abnormal; Notable for the following components:   Acetaminophen (Tylenol), Serum <10 (*)    All other components within normal limits  SALICYLATE LEVEL  CBC  RAPID URINE DRUG SCREEN, HOSP PERFORMED                                                                                                                         EKG  EKG Interpretation  Date/Time:    Ventricular Rate:    PR Interval:    QRS Duration:   QT Interval:    QTC Calculation:   R Axis:     Text Interpretation:         Radiology No results found. Pertinent labs & imaging results that were available during my care of the patient were reviewed by me and considered in my medical decision making (see chart for details).  Medications Ordered in ED Medications - No data to display  Procedures Procedures  (including critical care time)  Medical Decision Making / ED Course I have reviewed the nursing notes for this encounter and the patient's prior records (if available in EHR or on provided paperwork).    Patient here with alcohol intoxication and reported homicidal ideations.  Noncompliance with medication due to access.  Patient is willing to stay voluntarily for behavioral health evaluation.  Screening labs reassuring.  Notable for hyperglycemia without DKA.  Elevated alcohol level.  Patient will be allowed to metabolize to freedom.   Home medications ordered.  Behavioral health consult placed  Final Clinical Impression(s) / ED Diagnoses Final diagnoses:  Alcoholic intoxication without complication ([place])  Homicidal ideation      This chart was dictated using voice recognition software.  Despite best efforts to proofread,  errors can occur which can change the documentation meaning.   Fatima Blank, MD 12/29/17 [phone]

## 2017-12-29 NOTE — ED Notes (Signed)
Pt d/c home per MD order. Discharge summary reviewed with pt, pt verbalizes understanding. Pt denies SI/HI/AVH. Pt signed e-signature. personal property returned, ambulatory off unit with MHT.

## 2017-12-29 NOTE — ED Notes (Signed)
Patient is tearful-talking about his Mother who died in 20 and struggles with other family members.

## 2017-12-29 NOTE — ED Notes (Signed)
Bed: [facility] Expected date:  Expected time:  Means of arrival:  Comments: EMS 62 y/o, mental health

## 2017-12-29 NOTE — ED Notes (Signed)
Robyn in secure area reviewed patient's chart-patient still needs UA-security wanded patient-patient to secure area with 2 bags belongings

## 2017-12-29 NOTE — ED Notes (Signed)
Bed: [facility] Expected date:  Expected time:  Means of arrival:  Comments: [place]
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
## 2017-12-29 NOTE — ED Triage Notes (Signed)
Patient arrives by [facility]. [employer] called EMS to take patient to [facility] declined patient because he is an Insulin dependent diabetic and they do not administer meds. Patient states he is hearing voices. Patient is intoxicated. Patient reports homicidal ideation.

## 2017-12-29 NOTE — ED Notes (Signed)
Patient ate another [place] sandwich-interacting with staff/in good spirits

## 2018-01-09 ENCOUNTER — Ambulatory Visit: Payer: Medicaid Other | Admitting: Podiatry

## 2018-02-02 ENCOUNTER — Encounter (HOSPITAL_COMMUNITY): Payer: Self-pay | Admitting: Emergency Medicine

## 2018-02-02 ENCOUNTER — Emergency Department (HOSPITAL_COMMUNITY)
Admission: EM | Admit: 2018-02-02 | Discharge: 2018-02-02 | Disposition: A | Discharge status: Home / Self Care | Payer: Medicare HMO | Attending: Emergency Medicine | Admitting: Emergency Medicine

## 2018-02-02 ENCOUNTER — Emergency Department (HOSPITAL_COMMUNITY): Payer: Medicare HMO

## 2018-02-02 ENCOUNTER — Other Ambulatory Visit: Payer: Self-pay

## 2018-02-02 DIAGNOSIS — Z7982 Long term (current) use of aspirin: Secondary | ICD-10-CM | POA: Diagnosis not present

## 2018-02-02 DIAGNOSIS — F17201 Nicotine dependence, unspecified, in remission: Secondary | ICD-10-CM | POA: Insufficient documentation

## 2018-02-02 DIAGNOSIS — I10 Essential (primary) hypertension: Secondary | ICD-10-CM | POA: Insufficient documentation

## 2018-02-02 DIAGNOSIS — E119 Type 2 diabetes mellitus without complications: Secondary | ICD-10-CM | POA: Insufficient documentation

## 2018-02-02 DIAGNOSIS — R768 Other specified abnormal immunological findings in serum: Secondary | ICD-10-CM

## 2018-02-02 DIAGNOSIS — R112 Nausea with vomiting, unspecified: Secondary | ICD-10-CM | POA: Diagnosis not present

## 2018-02-02 DIAGNOSIS — Z794 Long term (current) use of insulin: Secondary | ICD-10-CM | POA: Diagnosis not present

## 2018-02-02 DIAGNOSIS — F1010 Alcohol abuse, uncomplicated: Secondary | ICD-10-CM

## 2018-02-02 DIAGNOSIS — R531 Weakness: Secondary | ICD-10-CM

## 2018-02-02 LAB — COMPREHENSIVE METABOLIC PANEL
ALBUMIN: 3.8 g/dL (ref 3.5–5.0)
ALT: 80 U/L — ABNORMAL HIGH (ref 0–44)
AST: 65 U/L — ABNORMAL HIGH (ref 15–41)
Alkaline Phosphatase: 209 U/L — ABNORMAL HIGH (ref 38–126)
Anion gap: 11 (ref 5–15)
BILIRUBIN TOTAL: 1.2 mg/dL (ref 0.3–1.2)
BUN: 13 mg/dL (ref 6–20)
CO2: 24 mmol/L (ref 22–32)
Calcium: 8.6 mg/dL — ABNORMAL LOW (ref 8.9–10.3)
Chloride: 103 mmol/L (ref 98–111)
Creatinine, Ser: 0.94 mg/dL (ref 0.61–1.24)
GFR calc Af Amer: >60 mL/min (ref >60)
GFR calc non Af Amer: >60 mL/min (ref >60)
Glucose, Bld: 316 mg/dL — ABNORMAL HIGH (ref 70–99)
Potassium: 4.5 mmol/L (ref 3.5–5.1)
Sodium: 138 mmol/L (ref 135–145)
Total Protein: 6.4 g/dL — ABNORMAL LOW (ref 6.5–8.1)

## 2018-02-02 LAB — CBC WITH DIFFERENTIAL/PLATELET
Abs Immature Granulocytes: 0.03 10*3/uL (ref 0.00–0.07)
BASOS ABS: 0 10*3/uL (ref 0.0–0.1)
Basophils Relative: 1 %
Eosinophils Absolute: 0.1 10*3/uL (ref 0.0–0.5)
Eosinophils Relative: 1 %
HCT: 44.4 % (ref 39.0–52.0)
Hemoglobin: 15.2 g/dL (ref 13.0–17.0)
Immature Granulocytes: 1 %
Lymphocytes Relative: 35 %
Lymphs Abs: 2.2 10*3/uL (ref 0.7–4.0)
MCH: 32.3 pg (ref 26.0–34.0)
MCHC: 34.2 g/dL (ref 30.0–36.0)
MCV: 94.5 fL (ref 80.0–100.0)
Monocytes Absolute: 0.4 10*3/uL (ref 0.1–1.0)
Monocytes Relative: 6 %
Neutro Abs: 3.6 10*3/uL (ref 1.7–7.7)
Neutrophils Relative %: 56 %
Platelets: 103 10*3/uL — ABNORMAL LOW (ref 150–400)
RBC: 4.7 MIL/uL (ref 4.22–5.81)
RDW: 14.9 % (ref 11.5–15.5)
SMEAR REVIEW: UNDETERMINED
WBC: 6.2 10*3/uL (ref 4.0–10.5)
nRBC: 0 % (ref 0.0–0.2)

## 2018-02-02 LAB — ETHANOL: Alcohol, Ethyl (B): 233 mg/dL — ABNORMAL HIGH (ref <10)

## 2018-02-02 LAB — LIPASE, BLOOD: Lipase: 23 U/L (ref 11–51)

## 2018-02-02 LAB — CBG MONITORING, ED: GLUCOSE-CAPILLARY: 291 mg/dL — AB (ref 70–99)

## 2018-02-02 MED ORDER — VITAMIN B-1 100 MG PO TABS: 100.0000 mg | ORAL_TABLET | Freq: Once | ORAL | Status: DC

## 2018-02-02 MED ORDER — FOLIC ACID 1 MG PO TABS: 1.0000 mg | ORAL_TABLET | Freq: Once | ORAL | Status: DC

## 2018-02-02 NOTE — ED Triage Notes (Signed)
Patient arrives bt [facility]-complaining of weakness. Patient picked up at the BP station.

## 2018-02-02 NOTE — ED Notes (Signed)
Pt last took medication for blood sugar yesterday morning.

## 2018-02-02 NOTE — ED Notes (Signed)
[phone] victoria (niece)

## 2018-02-02 NOTE — ED Notes (Signed)
Pt has called out 4 times in less than 3 minutes requesting something to eat and drink. Pt informed each time by various staff members that he has to wait until lab results come back and until a provider allows it.

## 2018-02-02 NOTE — ED Notes (Signed)
Bed: WA09 Expected date:  Expected time:  Means of arrival:  Comments: EMS 63 yo male CBG 288 "feels like he is going to have a syncopal episode" 120/70 NSR 100

## 2018-02-02 NOTE — ED Provider Notes (Signed)
[facility] DEPT Provider Note   CSN: [id number] Arrival date & time: 02/02/18  1922     History   Chief Complaint Chief Complaint  Patient presents with  . Weakness    HPI Michael [name] is a 63 y.o. male.  The history is provided by the patient and medical records. No language interpreter was used.  Weakness  Associated symptoms: abdominal pain, nausea and vomiting   Associated symptoms: no diarrhea    Michael [name] is a 63 y.o. male  with a PMH of DM, pancreatitis, schizophrenia,  who presents to the Emergency Department by EMS for not feeling well.  Per EMS, he called 911 and told him that he felt as if he was going to pass out.  When I asked him why he was in the emergency department today, he responded "I really don't know".  He states that he is cold and would like something to eat. I asked him if he had any abdominal pain and he said yes & that he was queasy.  He reports that he vomits every single day of his life, therefore he has had emesis today.  He denies any acute changes in this symptom.  He denies any fever or chills.  Does state that he has a cough, but unable to tell me how long this has been going on or any further details about this.  Denies any syncopal episode or fall.  Does report drinking a few beers before he came.  Denies any falls or syncopal episodes.   Past Medical History:  Diagnosis Date  . Anxiety   . Arthritis    "joints; shoulders; feet" (06/08/2016)  . Bipolar disorder ([place])   . Daily headache    "7:30 - 8:00 q night" (06/08/2016)  . Diabetic peripheral neuropathy ([place])   . GERD (gastroesophageal reflux disease)   . Pancreatitis   . Schizophrenia ([place])   . Seizures ([place]) 01/2016 X 2  . Type II diabetes mellitus [facility])     Patient Active Problem List   Diagnosis Date Noted  . Alcohol use disorder, severe, dependence ([place]) 12/29/2017  . Malnutrition of moderate degree 03/30/2017  . Hypoglycemia 03/26/2017  .  Acute metabolic encephalopathy 40/08/6759  . Other constipation 10/16/2016  . Recurrent falls 07/07/2016  . Wernicke's encephalopathy   . Altered mental status 06/09/2016  . Bipolar disorder ([place]) 06/01/2016  . Dry skin 01/18/2016  . Peripheral vascular disease ([place]) 11/15/2015  . Hyperglycemia 09/02/2015  . Tobacco abuse 09/02/2015  . Tricompartment osteoarthritis of both knees 06/15/2015  . Vitamin D deficiency 03/12/2015  . Hyperlipidemia 02/17/2015  . Protein-calorie malnutrition, severe 11/18/2014  . Alcohol use disorder   . Cervical arthritis 01/01/2014  . Health care maintenance 08/25/2011  . Chronic alcoholic pancreatitis ([place]) 04/20/2011  . Type 2 diabetes mellitus with peripheral neuropathy ([place]) 12/09/2010  . HTN (hypertension) 12/09/2010    Past Surgical History:  Procedure Laterality Date  . BALLOON DILATION  03/22/2011   Procedure: BALLOON DILATION;  Surgeon: Gatha Mayer, MD;  [facility];  Service: Endoscopy;  Laterality: N/A;  . COLONOSCOPY N/A 05/22/2012   Procedure: COLONOSCOPY;  Surgeon: Gatha Mayer, MD;  Location: [facility];  Service: Endoscopy;  Laterality: N/A;  . COLONOSCOPY W/ BIOPSIES AND POLYPECTOMY  09/12/11  . ESOPHAGOGASTRODUODENOSCOPY  03/22/2011   Procedure: ESOPHAGOGASTRODUODENOSCOPY (EGD);  Surgeon: Gatha Mayer, MD;  Location: [facility];  Service: Endoscopy;  Laterality: N/A;  egd with balloon   .  EUS  04/20/2011   Procedure: UPPER ENDOSCOPIC ULTRASOUND (EUS) LINEAR;  Surgeon: Milus Banister, MD;  [facility];  Service: Endoscopy;  Laterality: N/A;  . KNEE ARTHROSCOPY Left         Home Medications    Prior to Admission medications   Medication Sig Start Date End Date Taking? Authorizing Provider  ACCU-CHEK SOFTCLIX LANCETS lancets Use 3 times daily to check blood sugar. diag code E11.42. Insulin dependent 03/30/17   Roxan Hockey, MD  Alcohol Swabs (B-D SINGLE USE SWABS REGULAR) PADS Patient uses 6 times a  day. Patient tests 3 times per day and administers insulin 3 times a day. ICD 10 code E11.42 03/30/17   Roxan Hockey, MD  ammonium lactate (LAC-HYDRIN) 12 % cream Apply topically as needed for dry skin. Patient not taking: Reported on 12/29/2017 10/10/17   Gardiner Barefoot, DPM  ARIPiprazole (ABILIFY) 5 MG tablet Take 1 tablet (5 mg total) by mouth daily. 03/30/17   Roxan Hockey, MD  aspirin EC 81 MG tablet Take by mouth.    [provider]  BELBUCA [place] 1 Film inside cheek every 12 (twelve) hours. 12/25/17   [provider]  Blood Glucose Calibration (ACCU-CHEK AVIVA) SOLN 1 drop by In Vitro route as needed. diag code E08.10 insulin dependent. Use as device directs 03/30/17   Roxan Hockey, MD  Blood Glucose Monitoring Suppl (ACCU-CHEK AVIVA PLUS) w/Device KIT 1 kit by Does not apply route every morning. diag code E11.42. Insulin dependent 03/30/17   Roxan Hockey, MD  diclofenac sodium (VOLTAREN) 1 % GEL Apply 2 g topically 4 (four) times daily -  before meals and at bedtime. Patient not taking: Reported on 12/29/2017 01/05/17   Collier Salina, MD  DULoxetine (CYMBALTA) 60 MG capsule Take 1 capsule (60 mg total) by mouth daily. Patient not taking: Reported on 12/29/2017 03/30/17   Roxan Hockey, MD  feeding supplement, GLUCERNA SHAKE, (GLUCERNA SHAKE) LIQD Take 237 mLs by mouth 3 (three) times daily between meals. Patient not taking: Reported on 12/29/2017 03/30/17   Roxan Hockey, MD  folic acid (FOLVITE) 1 MG tablet Take 1 tablet (1 mg total) by mouth daily. Patient not taking: Reported on 12/29/2017 03/30/17   Roxan Hockey, MD  gabapentin (NEURONTIN) 300 MG capsule TAKE 3 CAPSULES BY MOUTH  EVERY MORNING AND  AFTERNOON. TAKE 4 CAPSULES  BY MOUTH AT NIGHT BEFORE  BED Patient taking differently: Take 900-1,200 mg by mouth 3 (three) times daily. TAKE 3 CAPSULES BY MOUTH  EVERY MORNING AND  AFTERNOON. TAKE 4 CAPSULES  BY MOUTH AT NIGHT BEFORE  BED 01/05/17    Collier Salina, MD  glipiZIDE (GLUCOTROL) 10 MG tablet Take by mouth.    [provider]  glucose blood (ACCU-CHEK AVIVA PLUS) test strip Use 3 times daily to check blood sugar. diag code E11.42. Insulin dependent 03/30/17   Roxan Hockey, MD  hydrOXYzine (ATARAX/VISTARIL) 10 MG tablet Take 10 mg by mouth 3 (three) times daily as needed for anxiety.  09/26/17   [provider]  ibuprofen (ADVIL,MOTRIN) 800 MG tablet Take 800 mg by mouth 3 (three) times daily as needed for mild pain.  09/17/17   [provider]  insulin aspart (NOVOLOG) 100 UNIT/ML FlexPen Take 5 units before breakfast,   before lunch, and 5 units before dinner (5 units before each meal) Patient taking differently: Inject 5-10 Units into the skin 3 (three) times daily with meals. Take 5 units before breakfast and 10 units  before lunch and dinner 03/30/17   Roxan Hockey, MD  LEVEMIR FLEXTOUCH 100 UNIT/ML Pen 4 units Twice a day 03/30/17   Roxan Hockey, MD  lipase/protease/amylase (CREON) 36000 UNITS CPEP capsule TAKE 1 CAPSULE BY MOUTH 3  TIMES DAILY BEFORE MEALS Patient not taking: Reported on 12/29/2017 01/05/17   Collier Salina, MD  Multiple Vitamin (MULTIVITAMIN WITH MINERALS) TABS tablet Take 1 tablet by mouth daily. Patient not taking: Reported on 12/29/2017 03/30/17   Roxan Hockey, MD  naloxone [facility]) nasal spray 4 mg/0.1 mL as needed. 07/13/17   [provider]  omega-3 acid ethyl esters (LOVAZA) 1 g capsule Take 1 capsule (1 g total) by mouth daily. Patient not taking: Reported on 12/29/2017 10/15/16   Sid Falcon, MD  oxyCODONE-acetaminophen (PERCOCET/ROXICET) 5-325 MG tablet Take 1 tablet by mouth every 12 (twelve) hours as needed for moderate pain.  10/04/17   [provider]  pantoprazole (PROTONIX) 40 MG tablet Take 1 tablet (40 mg total) by mouth daily. 03/30/17   Roxan Hockey, MD  pravastatin (PRAVACHOL) 10 MG tablet Take 1 tablet (10 mg total) by mouth daily  at 6 PM. Patient not taking: Reported on 12/29/2017 01/05/17   Collier Salina, MD  QUEtiapine Fumarate (SEROQUEL XR) 150 MG 24 hr tablet Take by mouth. 07/20/17   [provider]  thiamine (VITAMIN B-1) 100 MG tablet Take 1 tablet (100 mg total) by mouth daily. Patient not taking: Reported on 12/29/2017 03/30/17   Roxan Hockey, MD    Family History Family History  Problem Relation Age of Onset  . Hypertension Sister   . Diabetes Sister   . Heart disease Sister   . Colon cancer Neg Hx   . Stomach cancer Neg Hx   . Anesthesia problems Neg Hx   . Hypotension Neg Hx   . Pseudochol deficiency Neg Hx   . Malignant hyperthermia Neg Hx     Social History Social History   Tobacco Use  . Smoking status: Current Every Day Smoker    Packs/day: 0.50    Years: 42.00    Pack years: 21.00    Types: Cigarettes  . Smokeless tobacco: Never Used  . Tobacco comment: Sometimes less.  Substance Use Topics  . Alcohol use: Yes    Alcohol/week: 60.0 standard drinks    Types: 50 Shots of liquor, 10 Standard drinks or equivalent per week    Comment: Occasionally.  . Drug use: Yes    Types: Marijuana    Comment: Sometimes.     Allergies   Ace inhibitors; Losartan; and Tylenol [acetaminophen]   Review of Systems Review of Systems  Gastrointestinal: Positive for abdominal pain, nausea and vomiting. Negative for blood in stool, constipation and diarrhea.  Neurological: Positive for weakness.  All other systems reviewed and are negative.   Physical Exam Updated Vital Signs BP (!) 137/91 (BP Location: Right Arm)   Pulse 93   Temp (!) 97.4 F (36.3 C) (Oral)   Resp 18   SpO2 100%   Physical Exam Vitals signs and nursing note reviewed.  Constitutional:      General: He is not in acute distress.    Appearance: He is well-developed.  HENT:     Head: Normocephalic and atraumatic.  Cardiovascular:     Rate and Rhythm: Normal rate and regular rhythm.     Heart sounds:  Normal heart sounds. No murmur.  Pulmonary:     Effort: Pulmonary effort is normal. No respiratory distress.  Breath sounds: Normal breath sounds.  Abdominal:     General: There is no distension.     Palpations: Abdomen is soft.     Tenderness: There is no abdominal tenderness.  Musculoskeletal: Normal range of motion.  Skin:    General: Skin is warm and dry.  Neurological:     Mental Status: He is alert and oriented to person, place, and time.     Cranial Nerves: No cranial nerve deficit.      ED Treatments / Results  Labs (all labs ordered are listed, but only abnormal results are displayed) Labs Reviewed  CBC WITH DIFFERENTIAL/PLATELET  COMPREHENSIVE METABOLIC PANEL  ETHANOL  LIPASE, BLOOD  URINALYSIS, ROUTINE W REFLEX MICROSCOPIC    EKG None  Radiology No results found.  Procedures Procedures (including critical care time)  Medications Ordered in ED Medications  thiamine (VITAMIN B-1) tablet 100 mg (has no administration in time range)  folic acid (FOLVITE) tablet 1 mg (has no administration in time range)     Initial Impression / Assessment and Plan / ED Course  I have reviewed the triage vital signs and the nursing notes.  Pertinent labs & imaging results that were available during my care of the patient were reviewed by me and considered in my medical decision making (see chart for details).    Michael [name] is a 63 y.o. male who presents to ED for not feeling well and concerns for his health. His exam is reassuring. His labs are baseline other than elevated ETOH level. Evaluation does not show pathology that would require ongoing emergent intervention or inpatient treatment. PCP follow up encouraged. Return precautions discussed. All questions answered.  Final Clinical Impressions(s) / ED Diagnoses   Final diagnoses:  Weakness    ED Discharge Orders    None       , Ozella Almond, PA-C 02/02/18 2154    Isla Pence, MD 02/03/18  1500

## 2018-02-02 NOTE — ED Notes (Addendum)
Pt was given 1 sandwich and water.

## 2018-02-02 NOTE — Discharge Instructions (Addendum)
It was my pleasure taking care of you today!   Keep your upcoming appointment with your doctor.  Return to the emergency department for new or worsening symptoms, any additional concerns.

## 2018-02-02 NOTE — ED Notes (Signed)
Pt ambulatory to restroom without assistance 

## 2018-02-19 ENCOUNTER — Ambulatory Visit (INDEPENDENT_AMBULATORY_CARE_PROVIDER_SITE_OTHER): Payer: Medicare HMO | Admitting: Family

## 2018-02-19 ENCOUNTER — Encounter: Payer: Self-pay | Admitting: Family

## 2018-02-19 VITALS — BP 136/81 | HR 80 | Temp 98.5°F | Wt 136.2 lb

## 2018-02-19 DIAGNOSIS — Z789 Other specified health status: Secondary | ICD-10-CM

## 2018-02-19 DIAGNOSIS — R768 Other specified abnormal immunological findings in serum: Secondary | ICD-10-CM

## 2018-02-19 DIAGNOSIS — Z7289 Other problems related to lifestyle: Secondary | ICD-10-CM | POA: Diagnosis not present

## 2018-02-19 NOTE — Assessment & Plan Note (Signed)
Michael [name] continues to drink a significant amount of alcohol at close to 20-30 drinks per week. Liver enzyme elevation is likely multifactorial given several co-morbidities. Discussed working to decrease intake.

## 2018-02-19 NOTE — Progress Notes (Signed)
Subjective:    Patient ID: Michael [name], male    DOB: 12/01/1955, 63 y.o.   MRN: [id number]  Chief Complaint  Patient presents with  . Hepatitis B    HPI:  Michael [name] is a 63 y.o. male who presents today for initial office visit for Hepatitis B.  [name] has a previous history significant for depression, diabetes, hypertension, Bipolar disorder, and chronic alcoholic pancreatitis who was recently evaluated in his primary care office for diabetes follow-up and acute Hepatitis panel was ordered and found to have a positive Hepatitis B Core IgM antibody and negative Surface Antigen. His liver enzymes were also elevated with an AST of 118 and ALT of 99. He was referred to ID for further evaluation of Hepatitis B. In the interim he has been seen in the ED at [facility] on 02/02/18 with weakness and not feeling right. He had been drinking that evening and liver enzymes were elevated once again with AST 65 and ALT of 80. All labs appeared to be at baseline and was advised to follow up with PCP and ID.   In December was the first time that Michael [name] has heard about potential for Hepatitis B. He has not been treated in the past. He has no family history of liver disease in the past. Currently he does not have abdominal pain, but pain does wax and wane and when he does have pain it is itchy and located primarily on the left side. Denies any jaundice, scleral icterus, ease of bleeding, or nausea currently. Does have loose stools at times.   Allergies  Allergen Reactions  . Ace Inhibitors Swelling and Other (See Comments)    Angioedema - unquestionable  . Losartan Swelling and Other (See Comments)    Pt presented with unquestionable angioedema on ACEIs (Angiotensin-converting enzyme (ACE) inhibitors) so aviod ARBs (Angiotensin receptor blockers), as well  . Tylenol [Acetaminophen] Other (See Comments)    "cannot have this" (contraindicated)      Outpatient Medications Prior to Visit    Medication Sig Dispense Refill  . ACCU-CHEK SOFTCLIX LANCETS lancets Use 3 times daily to check blood sugar. diag code E11.42. Insulin dependent 100 each 5  . Alcohol Swabs (B-D SINGLE USE SWABS REGULAR) PADS Patient uses 6 times a day. Patient tests 3 times per day and administers insulin 3 times a day. ICD 10 code E11.42 200 each 5  . ammonium lactate (LAC-HYDRIN) 12 % cream Apply topically as needed for dry skin. (Patient not taking: Reported on 12/29/2017) 385 g 0  . ARIPiprazole (ABILIFY) 5 MG tablet Take 1 tablet (5 mg total) by mouth daily. 30 tablet 5  . aspirin EC 81 MG tablet Take by mouth.    . BELBUCA 450 MCG FILM Place 1 Film inside cheek every 12 (twelve) hours.  0  . Blood Glucose Calibration (ACCU-CHEK AVIVA) SOLN 1 drop by In Vitro route as needed. diag code E08.10 insulin dependent. Use as device directs 1 each 4  . Blood Glucose Monitoring Suppl (ACCU-CHEK AVIVA PLUS) w/Device KIT 1 kit by Does not apply route every morning. diag code E11.42. Insulin dependent 1 kit 0  . diclofenac sodium (VOLTAREN) 1 % GEL Apply 2 g topically 4 (four) times daily -  before meals and at bedtime. (Patient not taking: Reported on 12/29/2017) 100 g 5  . DULoxetine (CYMBALTA) 60 MG capsule Take 1 capsule (60 mg total) by mouth daily. (Patient not taking: Reported on 12/29/2017) 30 capsule 3  .  feeding supplement, GLUCERNA SHAKE, (GLUCERNA SHAKE) LIQD Take 237 mLs by mouth 3 (three) times daily between meals. (Patient not taking: Reported on 15/01/7614) 90 Can 2  . folic acid (FOLVITE) 1 MG tablet Take 1 tablet (1 mg total) by mouth daily. (Patient not taking: Reported on 12/29/2017) 30 tablet 5  . gabapentin (NEURONTIN) 300 MG capsule TAKE 3 CAPSULES BY MOUTH  EVERY MORNING AND  AFTERNOON. TAKE 4 CAPSULES  BY MOUTH AT NIGHT BEFORE  BED (Patient taking differently: Take 900-1,200 mg by mouth 3 (three) times daily. TAKE 3 CAPSULES BY MOUTH  EVERY MORNING AND  AFTERNOON. TAKE 4 CAPSULES  BY MOUTH AT NIGHT  BEFORE  BED) 300 capsule 5  . glipiZIDE (GLUCOTROL) 10 MG tablet Take by mouth.    Marland Kitchen glucose blood (ACCU-CHEK AVIVA PLUS) test strip Use 3 times daily to check blood sugar. diag code E11.42. Insulin dependent 300 each 12  . hydrOXYzine (ATARAX/VISTARIL) 10 MG tablet Take 10 mg by mouth 3 (three) times daily as needed for anxiety.     Marland Kitchen ibuprofen (ADVIL,MOTRIN) 800 MG tablet Take 800 mg by mouth 3 (three) times daily as needed for mild pain.     Marland Kitchen insulin aspart (NOVOLOG) 100 UNIT/ML FlexPen Take 5 units before breakfast,   before lunch, and 5 units before dinner (5 units before each meal) (Patient taking differently: Inject 5-10 Units into the skin 3 (three) times daily with meals. Take 5 units before breakfast and 10 units before lunch and dinner) 3 mL 3  . LEVEMIR FLEXTOUCH 100 UNIT/ML Pen 4 units Twice a day 15 mL 0  . lipase/protease/amylase (CREON) 36000 UNITS CPEP capsule TAKE 1 CAPSULE BY MOUTH 3  TIMES DAILY BEFORE MEALS (Patient not taking: Reported on 12/29/2017) 300 capsule 1  . Multiple Vitamin (MULTIVITAMIN WITH MINERALS) TABS tablet Take 1 tablet by mouth daily. (Patient not taking: Reported on 12/29/2017) 30 tablet 4  . naloxone (NARCAN) nasal spray 4 mg/0.1 mL as needed.    Marland Kitchen omega-3 acid ethyl esters (LOVAZA) 1 g capsule Take 1 capsule (1 g total) by mouth daily. (Patient not taking: Reported on 12/29/2017) 30 capsule 3  . oxyCODONE-acetaminophen (PERCOCET/ROXICET) 5-325 MG tablet Take 1 tablet by mouth every 12 (twelve) hours as needed for moderate pain.   0  . pantoprazole (PROTONIX) 40 MG tablet Take 1 tablet (40 mg total) by mouth daily. 30 tablet 5  . pravastatin (PRAVACHOL) 10 MG tablet Take 1 tablet (10 mg total) by mouth daily at 6 PM. (Patient not taking: Reported on 12/29/2017) 30 tablet 5  . QUEtiapine Fumarate (SEROQUEL XR) 150 MG 24 hr tablet Take by mouth.    . thiamine (VITAMIN B-1) 100 MG tablet Take 1 tablet (100 mg total) by mouth daily. (Patient not taking: Reported on  12/29/2017) 30 tablet 5   No facility-administered medications prior to visit.      Past Medical History:  Diagnosis Date  . Anxiety   . Arthritis    "joints; shoulders; feet" (06/08/2016)  . Bipolar disorder ([place])   . Daily headache    "7:30 - 8:00 q night" (06/08/2016)  . Diabetic peripheral neuropathy ([place])   . GERD (gastroesophageal reflux disease)   . Pancreatitis   . Schizophrenia ([place])   . Seizures ([place]) 01/2016 X 2  . Type II diabetes mellitus ([place])       Past Surgical History:  Procedure Laterality Date  . BALLOON DILATION  03/22/2011   Procedure: BALLOON DILATION;  Surgeon: Ofilia Neas  Carlean Purl, MD;  Location: [facility];  Service: Endoscopy;  Laterality: N/A;  . COLONOSCOPY N/A 05/22/2012   Procedure: COLONOSCOPY;  Surgeon: Gatha Mayer, MD;  Location: [facility];  Service: Endoscopy;  Laterality: N/A;  . COLONOSCOPY W/ BIOPSIES AND POLYPECTOMY  09/12/11  . ESOPHAGOGASTRODUODENOSCOPY  03/22/2011   Procedure: ESOPHAGOGASTRODUODENOSCOPY (EGD);  Surgeon: Gatha Mayer, MD;  Location: [facility];  Service: Endoscopy;  Laterality: N/A;  egd with balloon   . EUS  04/20/2011   Procedure: UPPER ENDOSCOPIC ULTRASOUND (EUS) LINEAR;  Surgeon: Milus Banister, MD;  Location: [facility];  Service: Endoscopy;  Laterality: N/A;  . KNEE ARTHROSCOPY Left       Family History  Problem Relation Age of Onset  . Hypertension Sister   . Diabetes Sister   . Heart disease Sister   . Colon cancer Neg Hx   . Stomach cancer Neg Hx   . Anesthesia problems Neg Hx   . Hypotension Neg Hx   . Pseudochol deficiency Neg Hx   . Malignant hyperthermia Neg Hx       Social History   Socioeconomic History  . Marital status: Single    Spouse name: Not on file  . Number of children: 0  . Years of education: 63  . Highest education level: Not on file  Occupational History  . Occupation:       Employer: [employer]    Comment: worked for 6 years  . Occupation: cook    Comment:   [employer], cook out  . Occupation: maintenance    Employer: [employer]  Social Needs  . Financial resource strain: Not on file  . Food insecurity:    Worry: Not on file    Inability: Not on file  . Transportation needs:    Medical: Not on file    Non-medical: Not on file  Tobacco Use  . Smoking status: Current Every Day Smoker    Packs/day: 0.50    Years: 42.00    Pack years: 21.00    Types: Cigarettes  . Smokeless tobacco: Never Used  . Tobacco comment: Sometimes less.  Substance and Sexual Activity  . Alcohol use: Yes    Alcohol/week: 19.0 - 20.0 standard drinks    Types: 10 Standard drinks or equivalent, 9 - 10 Cans of beer per week  . Drug use: Not Currently    Types: Marijuana    Comment: Sober for 2 years  . Sexual activity: Yes  Lifestyle  . Physical activity:    Days per week: Not on file    Minutes per session: Not on file  . Stress: Not on file  Relationships  . Social connections:    Talks on phone: Not on file    Gets together: Not on file    Attends religious service: Not on file    Active member of club or organization: Not on file    Attends meetings of clubs or organizations: Not on file    Relationship status: Not on file  . Intimate partner violence:    Fear of current or ex partner: Not on file    Emotionally abused: Not on file    Physically abused: Not on file    Forced sexual activity: Not on file  Other Topics Concern  . Not on file  Social History Narrative   Has been living at 3 different friend's homes since discharge 01/2011. Was living in the shelter prior to admission. Had previously lived with his  niece and other family members but he has gotten kicked out each time.       06/10/16 -pt reports lives w/sister and nieces      Review of Systems  Constitutional: Negative for chills, diaphoresis, fatigue and fever.  Respiratory: Negative for cough, chest tightness, shortness of breath and wheezing.   Cardiovascular: Negative  for chest pain.  Gastrointestinal: Negative for abdominal distention, abdominal pain, constipation, diarrhea, nausea and vomiting.  Neurological: Negative for weakness and headaches.  Hematological: Does not bruise/bleed easily.       Objective:    BP 136/81   Pulse 80   Temp 98.5 F (36.9 C)   Wt 136 lb 4 oz (61.8 kg)   BMI 23.39 kg/m  Nursing note and vital signs reviewed.  Physical Exam Constitutional:      General: He is not in acute distress.    Appearance: He is well-developed.  Cardiovascular:     Rate and Rhythm: Normal rate and regular rhythm.     Heart sounds: Normal heart sounds. No murmur. No friction rub. No gallop.   Pulmonary:     Effort: Pulmonary effort is normal. No respiratory distress.     Breath sounds: Normal breath sounds. No wheezing or rales.  Chest:     Chest wall: No tenderness.  Abdominal:     General: Bowel sounds are normal. There is no distension.     Palpations: Abdomen is soft. There is hepatomegaly. There is no mass.     Tenderness: There is no abdominal tenderness. There is no guarding or rebound.  Skin:    General: Skin is warm and dry.  Neurological:     Mental Status: He is alert.  Psychiatric:        Mood and Affect: Mood normal.         Assessment & Plan:   Problem List Items Addressed This Visit      Other   Alcohol use    Mr. Allman continues to drink a significant amount of alcohol at close to 20-30 drinks per week. Liver enzyme elevation is likely multifactorial given several co-morbidities. Discussed working to decrease intake.       Hepatitis B core antibody positive - Primary    Mr. Wheatley was noted to have a positive Core IgM antibody test with most recent acute Hepatitis panel, however surface antigen was negative. This leads to several differing possibilities from false positive test to early infection to cleared virus. I will check his blood work today. Given chronic hepatomegaly I am concerned for fibrosis and  will check an ultrasound with elastography. We discussed Hepatitis B transmission and treatment in detail. All questions were answered. Plan for follow up pending blood work and imaging results.       Relevant Orders   Hepatitis B core antibody, IgM   Hepatitis B core antibody, total   Hepatitis B DNA, ultraquantitative, PCR   Hepatitis B e antibody   Hepatitis B e antigen   Hepatitis B surface antibody,qualitative   Hepatitis B surface antigen   US ABDOMEN COMPLETE W/ELASTOGRAPHY       I am having Avondre L. Tyner maintain his omega-3 acid ethyl esters, diclofenac sodium, gabapentin, lipase/protease/amylase, pravastatin, ACCU-CHEK SOFTCLIX LANCETS, B-D SINGLE USE SWABS REGULAR, ARIPiprazole, ACCU-CHEK AVIVA, ACCU-CHEK AVIVA PLUS, DULoxetine, feeding supplement (GLUCERNA SHAKE), folic acid, glucose blood, multivitamin with minerals, thiamine, pantoprazole, insulin aspart, LEVEMIR FLEXTOUCH, aspirin EC, glipiZIDE, ibuprofen, naloxone, oxyCODONE-acetaminophen, QUEtiapine Fumarate, hydrOXYzine, ammonium lactate, and BELBUCA.   Follow-up:  Return in about 1 month (around 03/22/2018), or if symptoms worsen or fail to improve.    Terri Piedra, MSN, FNP-C Nurse Practitioner [facility] [place] [facility] Office phone: [phone] Pager: [place] number: [phone]

## 2018-02-19 NOTE — Patient Instructions (Addendum)
Nice to meet you.  We will check your blood work today and get you scheduled for an ultrasound.   This blood work will give [place] further details.   We will plan to follow up pending blood work results.    Hepatitis B Hepatitis B is a viral infection of the liver. There are two kinds of hepatitis B:  Acute hepatitis B. This lasts for six months or less.  Chronic hepatitis B. This lasts for more than six months. Chronic hepatitis B can lead to liver failure, scarring of the liver (cirrhosis), or liver cancer. Acute hepatitis B can turn into chronic hepatitis B. Most adults with acute hepatitis B do not develop chronic hepatitis B. Infants and young children who get hepatitis B are more likely to develop chronic hepatitis B than adults. The hepatitis B vaccine can prevent this condition. What are the causes? This condition is caused by the hepatitis B virus (HBV). The virus may spread from person to person (is contagious) through:  Blood.  Childbirth. A woman who has hepatitis B can pass it to her baby during birth.  Bodily fluids such as breast milk, tears, semen, vaginal fluids, and saliva. What increases the risk? The following factors may make you more likely to develop this condition:  Having contact with unclean (contaminated) needles or syringes. This may result from: ? Acupuncture. ? Tattooing. ? Body piercing. ? Injecting drugs.  Having unprotected sex with someone who is infected.  Living with or having close contact with a person who has hepatitis B.  Working in a job that involves contact with blood or bodily fluids, such as health care.  Traveling to a country that has many cases of hepatitis B.  Being on treatment to filter your blood (kidney dialysis).  Having a history of blood transfusions or organ transplants. What are the signs or symptoms? Symptoms of this condition may include:  Loss of appetite.  Fatigue.  Nausea.  Vomiting.  Stomach  pain.  Dark yellow urine.  Yellowish skin and eyes (jaundice).  Fever.  Light-colored or gray bowel movements.  Joint pain. In some cases, you may not have any symptoms. How is this diagnosed? This condition is diagnosed based on:  A physical exam.  Your medical history.  Blood tests. How is this treated? Treatment for chronic hepatitis B may include antiviral medicine. This medicine may help:  Lower your risk of liver failure, cirrhosis, or liver cancer.  Lower your ability to infect others with hepatitis B. You will need to avoid alcohol and medicines that can be hard for the liver to break down (metabolize). This helps prevent further injury to your liver. Follow these instructions at home: Medicines   Take over-the-counter and prescription medicines only as told by your health care provider.  Take your antiviral medicine as told by your health care provider. Do not stop taking the antiviral even if you start to feel better.  Do not take any over-the-counter medicines that contain acetaminophen.  Do not take any new medicines, including over-the-counter medicines for fever or pain, unless approved by your health care provider. Activity  Rest as needed.  Do not have sex unless approved by your health care provider.  Ask your health care provider when you may return to school or work. Eating and drinking  Eat a balanced diet with plenty of fruits and vegetables, whole grains, and lowfat (lean) meats or other non-meat proteins (such as beans or tofu).  Drink enough fluids to keep your  urine clear or pale yellow.  Avoid alcohol. General instructions  Do not share toothbrushes, nail clippers, or razors.  Wash your hands frequently with soap and water. If soap and water are not available, use hand sanitizer.  Keep all follow-up visits as told by your health care provider. This is important. How is this prevented?  Get the hepatitis B vaccine. This helps  prevent the hepatitis B infection.  Wash your hands frequently with soap and water. If soap and water are not available, use hand sanitizer.  Do not share needles or syringes.  Practice safe sex and use condoms.  Avoid handling blood or bodily fluids without gloves or other protection.  Avoid getting tattoos or piercings in shops or other locations that are not clean. Contact a health care provider if:  You develop a rash.  You develop jaundice, or your chronic jaundice becomes more severe.  You have a fever. Get help right away if:  You are unable to eat or drink.  You have a fever along with nausea or vomiting.  You feel confused.  You have trouble breathing.  Your skin, throat, mouth, or face becomes swollen.  You have jerky movements that you cannot control (seizure).  You become very sleepy or have trouble waking up.  Your stomach becomes very swollen. Summary  Hepatitis B is a viral infection of the liver. There are two kinds of hepatitis B: acute and chronic.  The hepatitis B virus (HBV) can be passed from person to person (is contagious).  You should not take any new medicines, including over-the-counter medicines for fever or pain, unless approved by your health care provider.  To help prevent hepatitis B, wash your hands frequently with soap and water. If soap and water are not available, use hand sanitizer. This information is not intended to replace advice given to you by your health care provider. Make sure you discuss any questions you have with your health care provider. Document Released: 01/07/2000 Document Revised: 02/15/2016 Document Reviewed: 02/15/2016 Elsevier Interactive Patient Education  2019 [employer].

## 2018-02-19 NOTE — Assessment & Plan Note (Signed)
Mr. Rozo was noted to have a positive Core IgM antibody test with most recent acute Hepatitis panel, however surface antigen was negative. This leads to several differing possibilities from false positive test to early infection to cleared virus. I will check his blood work today. Given chronic hepatomegaly I am concerned for fibrosis and will check an ultrasound with elastography. We discussed Hepatitis B transmission and treatment in detail. All questions were answered. Plan for follow up pending blood work and imaging results.

## 2018-02-21 LAB — HEPATITIS B SURFACE ANTIGEN: Hepatitis B Surface Ag: NONREACTIVE

## 2018-02-21 LAB — HEPATITIS B E ANTIGEN: Hep B E Ag: NONREACTIVE

## 2018-02-21 LAB — HEPATITIS B DNA, ULTRAQUANTITATIVE, PCR
Hepatitis B DNA (Calc): <1 Log IU/mL
Hepatitis B DNA: <10 IU/mL

## 2018-02-21 LAB — HEPATITIS B E ANTIBODY: Hep B E Ab: REACTIVE — AB

## 2018-02-21 LAB — HEPATITIS B CORE ANTIBODY, TOTAL: Hep B Core Total Ab: REACTIVE — AB

## 2018-02-21 LAB — HEPATITIS B SURFACE ANTIBODY,QUALITATIVE: Hep B S Ab: REACTIVE — AB

## 2018-02-21 LAB — HEPATITIS B CORE ANTIBODY, IGM: HEP B C IGM: NONREACTIVE

## 2018-02-22 ENCOUNTER — Telehealth: Payer: Self-pay | Admitting: Family

## 2018-02-22 NOTE — Telephone Encounter (Signed)
Attempted to call Michael [name] with the results and his sister answered the phone. No information was provided as there is no release of information form available. Will attempt to call again.

## 2018-02-26 ENCOUNTER — Ambulatory Visit (HOSPITAL_COMMUNITY): Admission: RE | Admit: 2018-02-26 | Payer: Medicare HMO | Source: Ambulatory Visit

## 2018-03-22 ENCOUNTER — Ambulatory Visit (INDEPENDENT_AMBULATORY_CARE_PROVIDER_SITE_OTHER): Payer: Medicare HMO | Admitting: Family

## 2018-03-22 ENCOUNTER — Encounter: Payer: Self-pay | Admitting: Family

## 2018-03-22 VITALS — BP 100/67 | HR 88 | Temp 98.2°F | Ht 64.0 in | Wt 138.0 lb

## 2018-03-22 DIAGNOSIS — Z8619 Personal history of other infectious and parasitic diseases: Secondary | ICD-10-CM | POA: Insufficient documentation

## 2018-03-22 HISTORY — DX: Personal history of other infectious and parasitic diseases: Z86.19

## 2018-03-22 NOTE — Assessment & Plan Note (Signed)
Michael [name] most recent blood work confirms previous Hepatitis B infection now resolved and with immunity as evidenced by his positive Surface Antibody and negative Surface Antigen. He does have elevated liver enzymes and would encourage him to follow up with primary care to rule out fibrosis or possibly fatty liver disease. No futher evaluation or treatment is needed for Hepatitis B. Follow up with ID as needed.

## 2018-03-22 NOTE — Progress Notes (Signed)
Subjective:    Patient ID: Michael [name], male    DOB: [birth year], 63 y.o.   MRN: [id number]  Chief Complaint  Patient presents with  . Hepatitis B     HPI:  Michael [name] is a 63 y.o. male who presents today for follow up of Hepatitis B.  Michael [name] was last seen in the office 02/19/18 with concern for positive Hepatitis B status and found to have resolved previous Hepatitis B infection with positive Core total, E antibody, and surface antibody tests. Surface antigen test was negative.  Michael [name] has no abdominal pain, nausea, vomiting, diarrhea, jaundice, or scleral icterus. Working on controlling his blood sugars and being referred to endocrinology.    Allergies  Allergen Reactions  . Ace Inhibitors Swelling and Other (See Comments)    Angioedema - unquestionable  . Losartan Swelling and Other (See Comments)    Pt presented with unquestionable angioedema on ACEIs (Angiotensin-converting enzyme (ACE) inhibitors) so aviod ARBs (Angiotensin receptor blockers), as well  . Tylenol [Acetaminophen] Other (See Comments)    "cannot have this" (contraindicated)      Outpatient Medications Prior to Visit  Medication Sig Dispense Refill  . ACCU-CHEK SOFTCLIX LANCETS lancets Use 3 times daily to check blood sugar. diag code E11.42. Insulin dependent 100 each 5  . Alcohol Swabs (B-D SINGLE USE SWABS REGULAR) PADS Patient uses 6 times a day. Patient tests 3 times per day and administers insulin 3 times a day. ICD 10 code E11.42 200 each 5  . ammonium lactate (LAC-HYDRIN) 12 % cream Apply topically as needed for dry skin. 385 g 0  . ARIPiprazole (ABILIFY) 5 MG tablet Take 1 tablet (5 mg total) by mouth daily. 30 tablet 5  . aspirin EC 81 MG tablet Take by mouth.    . BELBUCA 450 MCG FILM Place 1 Film inside cheek every 12 (twelve) hours.  0  . Blood Glucose Calibration (ACCU-CHEK AVIVA) SOLN 1 drop by In Vitro route as needed. diag code E08.10 insulin dependent. Use as device directs 1  each 4  . Blood Glucose Monitoring Suppl (ACCU-CHEK AVIVA PLUS) w/Device KIT 1 kit by Does not apply route every morning. diag code E11.42. Insulin dependent 1 kit 0  . diclofenac sodium (VOLTAREN) 1 % GEL Apply 2 g topically 4 (four) times daily -  before meals and at bedtime. 100 g 5  . DULoxetine (CYMBALTA) 60 MG capsule Take 1 capsule (60 mg total) by mouth daily. 30 capsule 3  . feeding supplement, GLUCERNA SHAKE, (GLUCERNA SHAKE) LIQD Take 237 mLs by mouth 3 (three) times daily between meals. 90 Can 2  . folic acid (FOLVITE) 1 MG tablet Take 1 tablet (1 mg total) by mouth daily. 30 tablet 5  . gabapentin (NEURONTIN) 300 MG capsule TAKE 3 CAPSULES BY MOUTH  EVERY MORNING AND  AFTERNOON. TAKE 4 CAPSULES  BY MOUTH AT NIGHT BEFORE  BED (Patient taking differently: Take 900-1,200 mg by mouth 3 (three) times daily. TAKE 3 CAPSULES BY MOUTH  EVERY MORNING AND  AFTERNOON. TAKE 4 CAPSULES  BY MOUTH AT NIGHT BEFORE  BED) 300 capsule 5  . glipiZIDE (GLUCOTROL) 10 MG tablet Take by mouth.    Marland Kitchen glucose blood (ACCU-CHEK AVIVA PLUS) test strip Use 3 times daily to check blood sugar. diag code E11.42. Insulin dependent 300 each 12  . hydrOXYzine (ATARAX/VISTARIL) 10 MG tablet Take 10 mg by mouth 3 (three) times daily as needed for anxiety.     Marland Kitchen  ibuprofen (ADVIL,MOTRIN) 800 MG tablet Take 800 mg by mouth 3 (three) times daily as needed for mild pain.     Marland Kitchen insulin aspart (NOVOLOG) 100 UNIT/ML FlexPen Take 5 units before breakfast,   before lunch, and 5 units before dinner (5 units before each meal) (Patient taking differently: Inject 5-10 Units into the skin 3 (three) times daily with meals. Take 5 units before breakfast and 10 units before lunch and dinner) 3 mL 3  . LEVEMIR FLEXTOUCH 100 UNIT/ML Pen 4 units Twice a day 15 mL 0  . lipase/protease/amylase (CREON) 36000 UNITS CPEP capsule TAKE 1 CAPSULE BY MOUTH 3  TIMES DAILY BEFORE MEALS 300 capsule 1  . Multiple Vitamin (MULTIVITAMIN WITH MINERALS) TABS tablet  Take 1 tablet by mouth daily. 30 tablet 4  . naloxone (NARCAN) nasal spray 4 mg/0.1 mL as needed.    Marland Kitchen omega-3 acid ethyl esters (LOVAZA) 1 g capsule Take 1 capsule (1 g total) by mouth daily. 30 capsule 3  . oxyCODONE-acetaminophen (PERCOCET/ROXICET) 5-325 MG tablet Take 1 tablet by mouth every 12 (twelve) hours as needed for moderate pain.   0  . pantoprazole (PROTONIX) 40 MG tablet Take 1 tablet (40 mg total) by mouth daily. 30 tablet 5  . pravastatin (PRAVACHOL) 10 MG tablet Take 1 tablet (10 mg total) by mouth daily at 6 PM. 30 tablet 5  . QUEtiapine Fumarate (SEROQUEL XR) 150 MG 24 hr tablet Take by mouth.    . thiamine (VITAMIN B-1) 100 MG tablet Take 1 tablet (100 mg total) by mouth daily. 30 tablet 5   No facility-administered medications prior to visit.      Past Medical History:  Diagnosis Date  . Anxiety   . Arthritis    "joints; shoulders; feet" (06/08/2016)  . Bipolar disorder ([place])   . Daily headache    "7:30 - 8:00 q night" (06/08/2016)  . Diabetic peripheral neuropathy ([place])   . GERD (gastroesophageal reflux disease)   . History of hepatitis B virus infection conferring immunity 03/22/2018  . Pancreatitis   . Schizophrenia ([place])   . Seizures ([place]) 01/2016 X 2  . Type II diabetes mellitus ([place])      Past Surgical History:  Procedure Laterality Date  . BALLOON DILATION  03/22/2011   Procedure: BALLOON DILATION;  Surgeon: Gatha Mayer, MD;  Location: [facility];  Service: Endoscopy;  Laterality: N/A;  . COLONOSCOPY N/A 05/22/2012   Procedure: COLONOSCOPY;  Surgeon: Gatha Mayer, MD;  Location: [facility];  Service: Endoscopy;  Laterality: N/A;  . COLONOSCOPY W/ BIOPSIES AND POLYPECTOMY  09/12/11  . ESOPHAGOGASTRODUODENOSCOPY  03/22/2011   Procedure: ESOPHAGOGASTRODUODENOSCOPY (EGD);  Surgeon: Gatha Mayer, MD;  Location: [facility];  Service: Endoscopy;  Laterality: N/A;  egd with balloon   . EUS  04/20/2011   Procedure: UPPER ENDOSCOPIC ULTRASOUND (EUS)  LINEAR;  Surgeon: Milus Banister, MD;  Location: [facility];  Service: Endoscopy;  Laterality: N/A;  . KNEE ARTHROSCOPY Left        Review of Systems  Constitutional: Negative for chills, diaphoresis, fatigue and fever.  Respiratory: Negative for cough, chest tightness, shortness of breath and wheezing.   Cardiovascular: Negative for chest pain.  Gastrointestinal: Negative for abdominal distention, abdominal pain, constipation, diarrhea, nausea and vomiting.  Neurological: Negative for weakness and headaches.  Hematological: Does not bruise/bleed easily.      Objective:    BP 100/67   Pulse 88   Temp 98.2 F (36.8 C) (Oral)   Ht  _0  (1.626 m)   Wt 138 lb (62.6 kg)   BMI 23.69 kg/m  Nursing note and vital signs reviewed.  Physical Exam Constitutional:      General: He is not in acute distress.    Appearance: He is well-developed.  Cardiovascular:     Rate and Rhythm: Normal rate and regular rhythm.     Heart sounds: Normal heart sounds. No murmur. No friction rub. No gallop.   Pulmonary:     Effort: Pulmonary effort is normal. No respiratory distress.     Breath sounds: Normal breath sounds. No wheezing or rales.  Chest:     Chest wall: No tenderness.  Abdominal:     General: Bowel sounds are normal. There is no distension.     Palpations: Abdomen is soft. There is no mass.     Tenderness: There is no abdominal tenderness. There is no guarding or rebound.  Skin:    General: Skin is warm and dry.  Neurological:     Mental Status: He is alert.  Psychiatric:        Mood and Affect: Mood normal.        Assessment & Plan:   Problem List Items Addressed This Visit      Other   History of hepatitis B virus infection conferring immunity - Primary    Michael [name] most recent blood work confirms previous Hepatitis B infection now resolved and with immunity as evidenced by his positive Surface Antibody and negative Surface Antigen. He does have elevated liver  enzymes and would encourage him to follow up with primary care to rule out fibrosis or possibly fatty liver disease. No futher evaluation or treatment is needed for Hepatitis B. Follow up with ID as needed.           I am having Michael [name] maintain his omega-3 acid ethyl esters, diclofenac sodium, gabapentin, lipase/protease/amylase, pravastatin, ACCU-CHEK SOFTCLIX LANCETS, B-D SINGLE USE SWABS REGULAR, ARIPiprazole, ACCU-CHEK AVIVA, ACCU-CHEK AVIVA PLUS, DULoxetine, feeding supplement (GLUCERNA SHAKE), folic acid, glucose blood, multivitamin with minerals, thiamine, pantoprazole, insulin aspart, LEVEMIR FLEXTOUCH, aspirin EC, glipiZIDE, ibuprofen, naloxone, oxyCODONE-acetaminophen, QUEtiapine Fumarate, hydrOXYzine, ammonium lactate, and BELBUCA.   Follow-up: Return if symptoms worsen or fail to improve.   Terri Piedra, MSN, FNP-C Nurse Practitioner [facility] Office phone: [phone] Pager: [place] number: [phone]

## 2018-03-22 NOTE — Patient Instructions (Addendum)
Nice to see you.  Your blood work shows that you HAD Hepatitis B in the past and your immune system has fought it off and you are now IMMUNE TO HEPATITIS B and no treatment is needed.  Recommend follow up with primary care for evaluation of elevated liver enzymes.   No follow with ID required at this time.  Have a great day!

## 2018-04-19 ENCOUNTER — Ambulatory Visit: Payer: Medicaid Other | Admitting: Podiatry

## 2018-05-08 IMAGING — CT CT HEAD W/O CM
4 series · 16 of 47 positions shown, 18 images · non-contrast
Comparison: 06/05/2016

CLINICAL DATA: Altered mental status

EXAM:
CT HEAD WITHOUT CONTRAST
TECHNIQUE: Contiguous axial images were obtained from the base of the skull
through the vertex without intravenous contrast.

[Series 2: head w/o · axial · non-contrast · 0.43mm/px · z∈[-86,+28]mm · 7 of 31 slices shown, 9 images]
[im 4/31  brain]
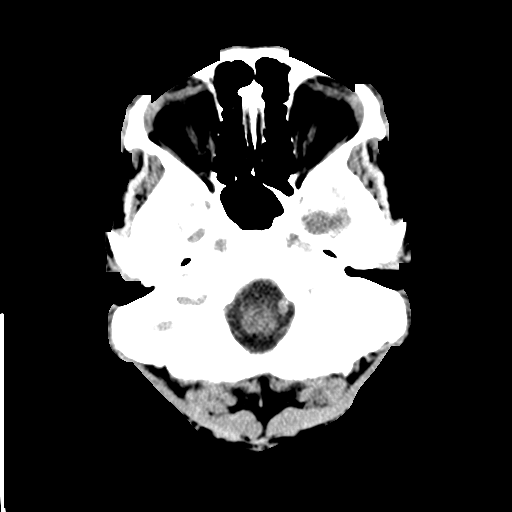
[im 4/31  bone]
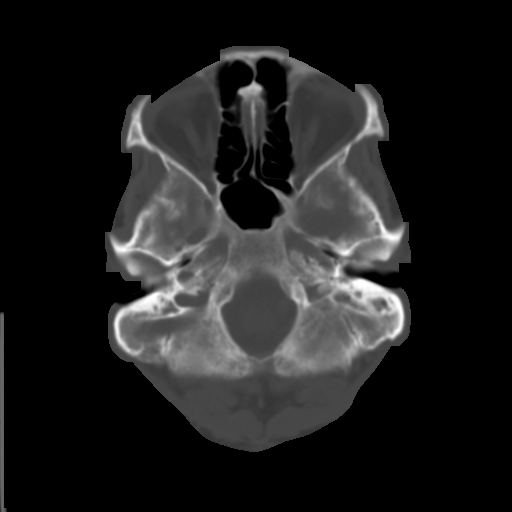
[im 8/31  brain]
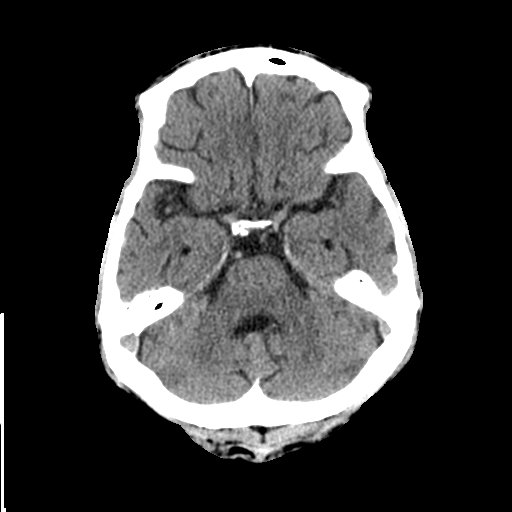
[im 12/31  brain]
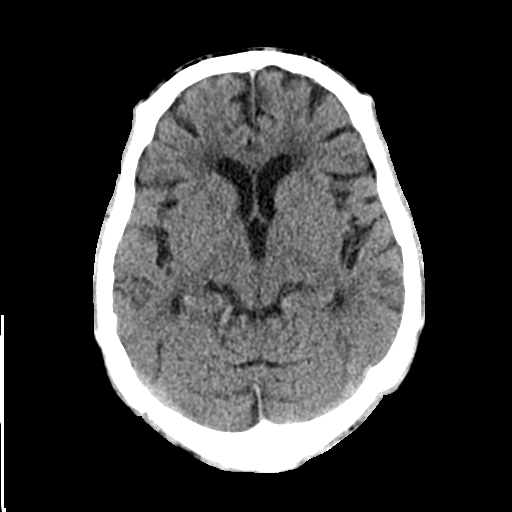
[im 16/31  brain]
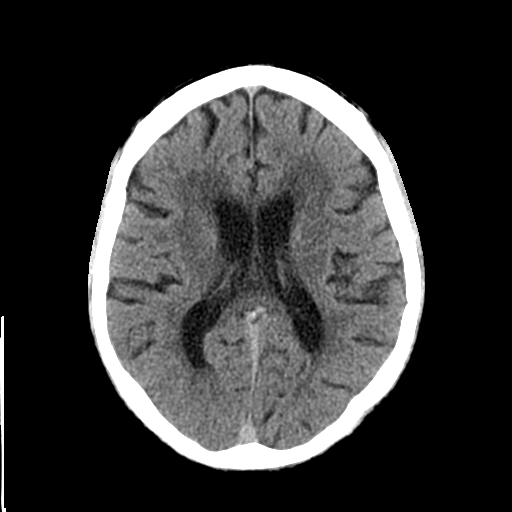
[im 19/31  brain]
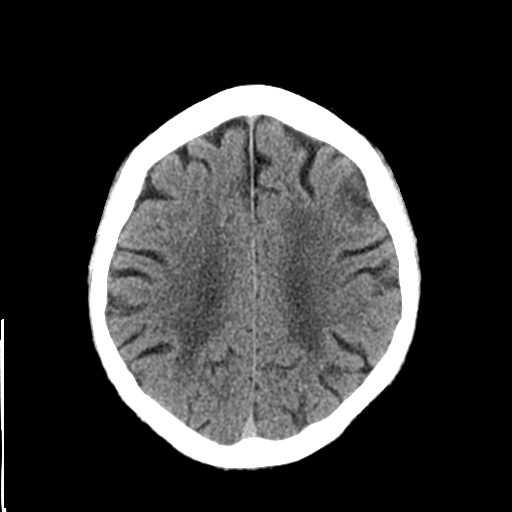
[im 19/31  bone]
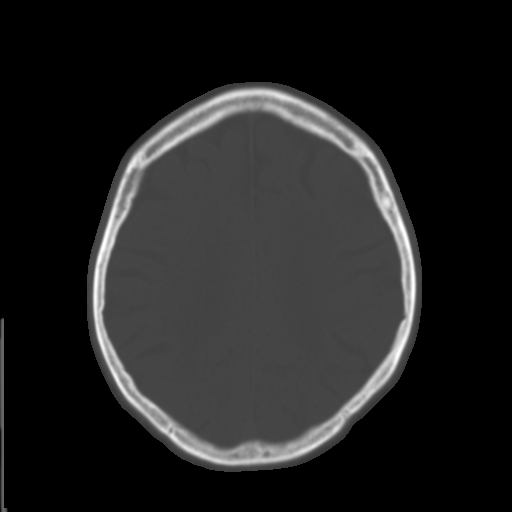
[im 23/31  brain]
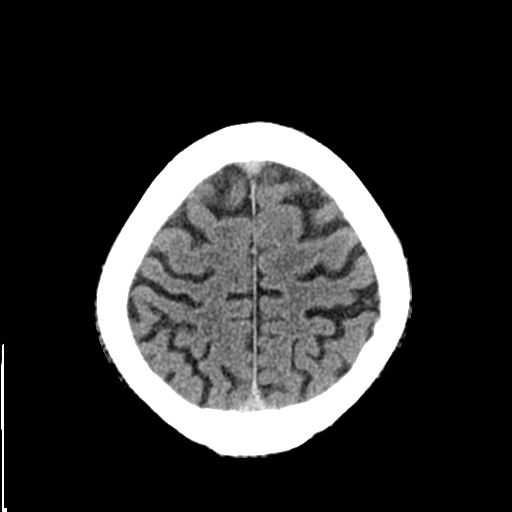
[im 27/31  brain]
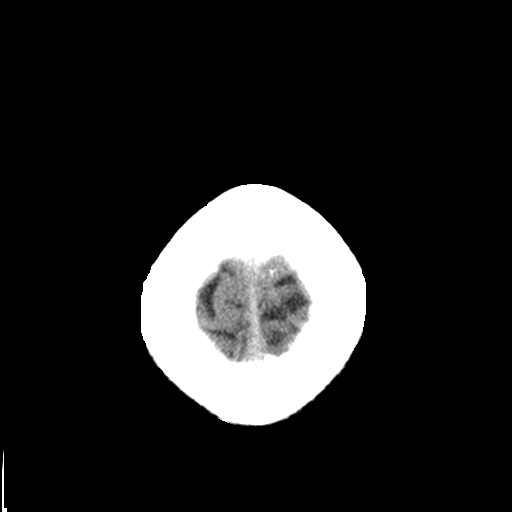

[Series 3: bone windows · axial · 0.43mm/px · z∈[-87,-57]mm · 3 of 76 slices shown]
[im 8/76  bone]
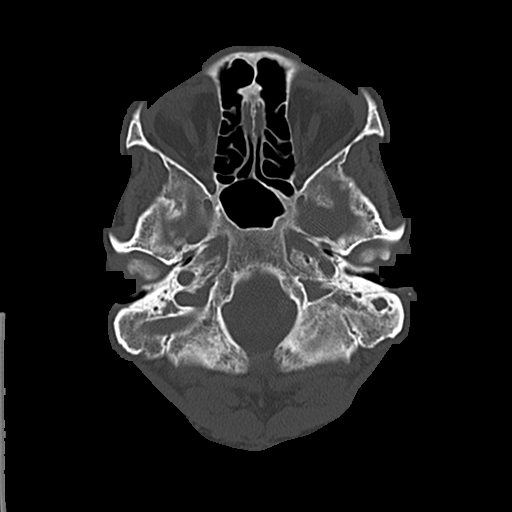
[im 16/76  bone]
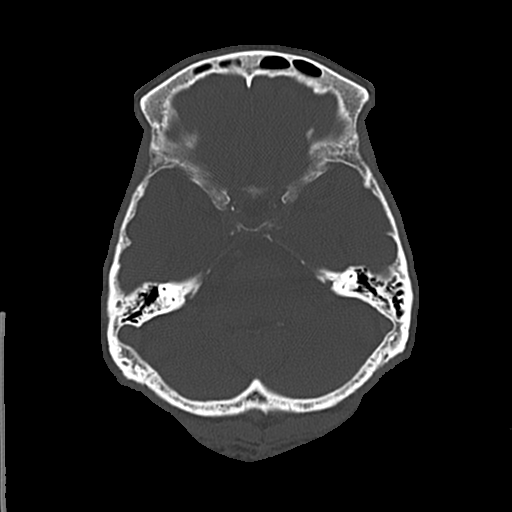
[im 23/76  bone]
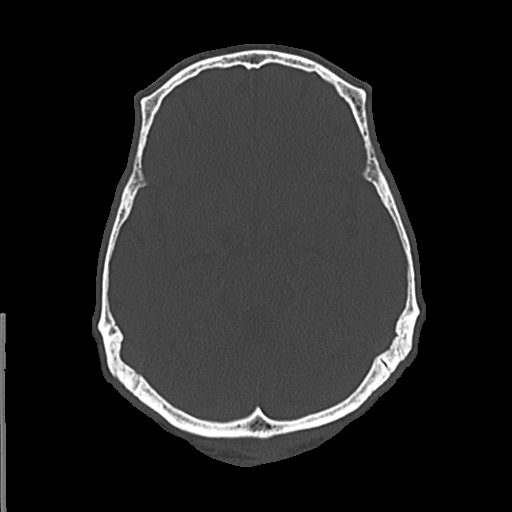

[Series 5: coronal · coronal · 0.29mm/px · 3 of 72 slices shown]
[im 24/72  brain]
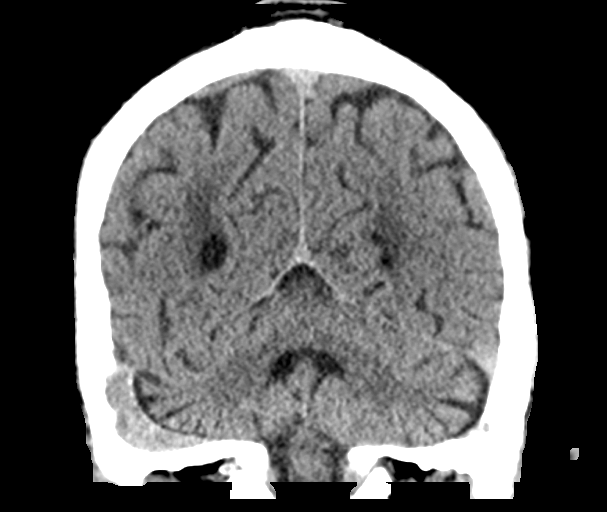
[im 32/72  brain]
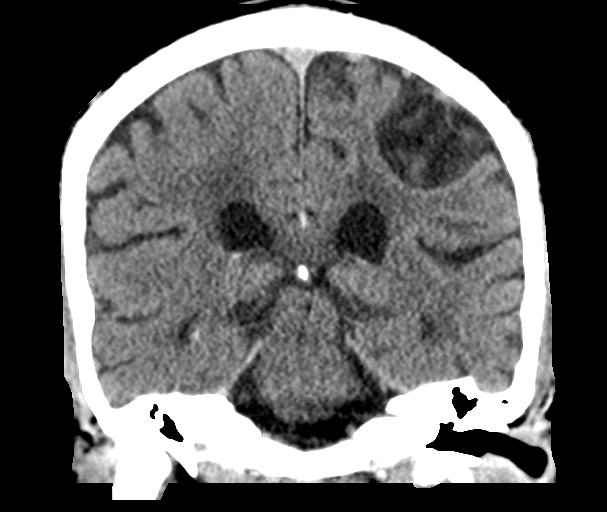
[im 40/72  brain]
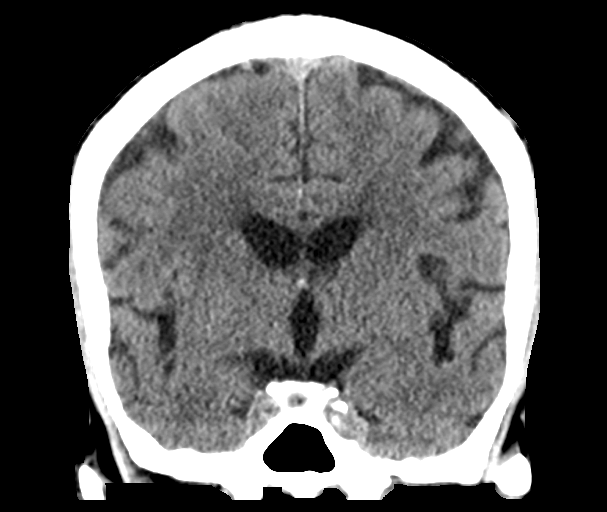

[Series 6: sagittal · sagittal · 0.29mm/px · 3 of 60 slices shown]
[im 20/60  brain]
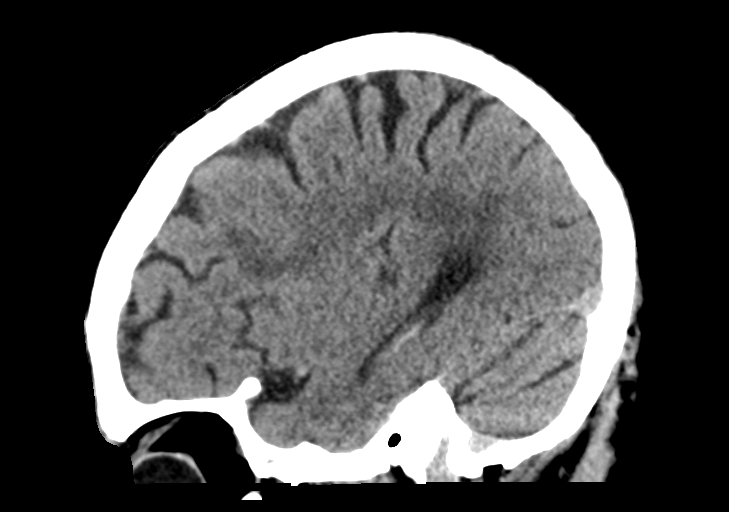
[im 30/60  brain]
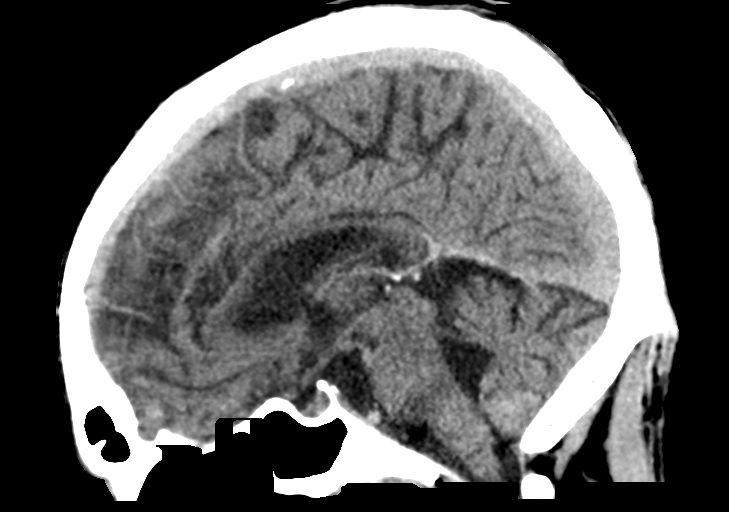
[im 40/60  brain]
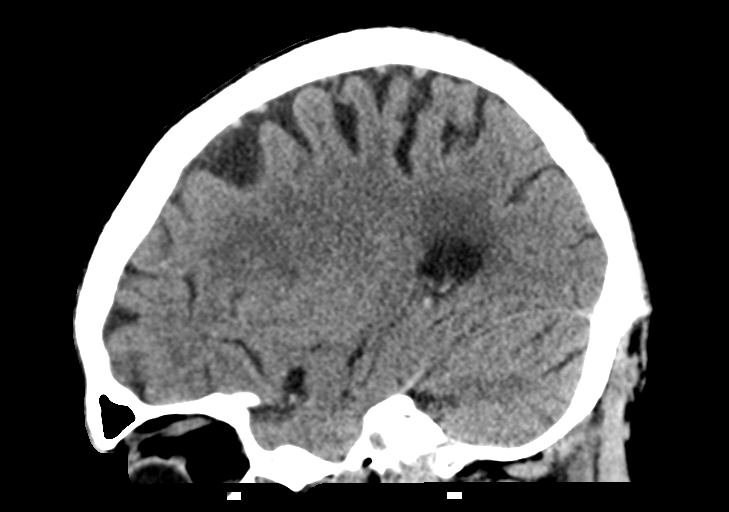

[16 of 47 positions shown; findings below may reference images not displayed]

FINDINGS: Brain: No acute territorial infarction, hemorrhage or intracranial
mass is visualized. Periventricular and subcortical white matter
hypodensities consistent with small vessel ischemic change. Mild
atrophy. Stable ventricle size.

Vascular: No hyperdense vessels.  Carotid artery calcifications.

Skull: No fracture or suspicious bone lesion. Small fluid in the
inferior mastoids.

Sinuses/Orbits: Clear paranasal sinuses. No acute orbital
abnormality.

Other: None
IMPRESSION: No CT evidence for acute intracranial abnormality. Moderate small
vessel ischemic changes of the white matter along with atrophy.

## 2018-06-06 NOTE — Telephone Encounter (Signed)
done

## 2018-08-07 ENCOUNTER — Ambulatory Visit: Payer: Medicaid Other | Admitting: Podiatry

## 2018-08-20 ENCOUNTER — Ambulatory Visit: Payer: Medicaid Other | Admitting: Podiatry

## 2018-09-18 ENCOUNTER — Encounter (HOSPITAL_COMMUNITY): Payer: Self-pay

## 2018-09-18 ENCOUNTER — Other Ambulatory Visit: Payer: Self-pay

## 2018-09-18 ENCOUNTER — Emergency Department (HOSPITAL_COMMUNITY)
Admission: EM | Admit: 2018-09-18 | Discharge: 2018-09-18 | Disposition: A | Discharge status: Home / Self Care | Payer: Medicare Other | Attending: Emergency Medicine | Admitting: Emergency Medicine

## 2018-09-18 ENCOUNTER — Emergency Department (HOSPITAL_COMMUNITY): Payer: Medicare Other

## 2018-09-18 DIAGNOSIS — Y9389 Activity, other specified: Secondary | ICD-10-CM | POA: Diagnosis not present

## 2018-09-18 DIAGNOSIS — R569 Unspecified convulsions: Secondary | ICD-10-CM | POA: Diagnosis not present

## 2018-09-18 DIAGNOSIS — Z79899 Other long term (current) drug therapy: Secondary | ICD-10-CM | POA: Insufficient documentation

## 2018-09-18 DIAGNOSIS — W19XXXA Unspecified fall, initial encounter: Secondary | ICD-10-CM

## 2018-09-18 DIAGNOSIS — E1151 Type 2 diabetes mellitus with diabetic peripheral angiopathy without gangrene: Secondary | ICD-10-CM | POA: Diagnosis not present

## 2018-09-18 DIAGNOSIS — I10 Essential (primary) hypertension: Secondary | ICD-10-CM | POA: Insufficient documentation

## 2018-09-18 DIAGNOSIS — S01111A Laceration without foreign body of right eyelid and periocular area, initial encounter: Secondary | ICD-10-CM | POA: Diagnosis not present

## 2018-09-18 DIAGNOSIS — Z23 Encounter for immunization: Secondary | ICD-10-CM | POA: Insufficient documentation

## 2018-09-18 DIAGNOSIS — Z7982 Long term (current) use of aspirin: Secondary | ICD-10-CM | POA: Diagnosis not present

## 2018-09-18 DIAGNOSIS — Y998 Other external cause status: Secondary | ICD-10-CM | POA: Insufficient documentation

## 2018-09-18 DIAGNOSIS — Y92009 Unspecified place in unspecified non-institutional (private) residence as the place of occurrence of the external cause: Secondary | ICD-10-CM

## 2018-09-18 DIAGNOSIS — E1142 Type 2 diabetes mellitus with diabetic polyneuropathy: Secondary | ICD-10-CM | POA: Diagnosis not present

## 2018-09-18 DIAGNOSIS — Y92099 Unspecified place in other non-institutional residence as the place of occurrence of the external cause: Secondary | ICD-10-CM | POA: Insufficient documentation

## 2018-09-18 DIAGNOSIS — Z794 Long term (current) use of insulin: Secondary | ICD-10-CM | POA: Insufficient documentation

## 2018-09-18 DIAGNOSIS — F17210 Nicotine dependence, cigarettes, uncomplicated: Secondary | ICD-10-CM | POA: Diagnosis not present

## 2018-09-18 LAB — CBG MONITORING, ED
Glucose-Capillary: 455 mg/dL — ABNORMAL HIGH (ref 70–99)
Glucose-Capillary: 385 mg/dL — ABNORMAL HIGH (ref 70–99)

## 2018-09-18 MED ORDER — HYDROGEN PEROXIDE 3 % EX SOLN
CUTANEOUS | Status: AC
  Administered 2018-09-18: 05:00:00
  Filled 2018-09-18: qty 473

## 2018-09-18 MED ORDER — SODIUM CHLORIDE 0.9 % IV BOLUS: 1000.0000 mL | Freq: Once | INTRAVENOUS | Status: DC

## 2018-09-18 MED ORDER — LIDOCAINE HCL (PF) 1 % IJ SOLN
5.0000 mL | Freq: Once | INTRAMUSCULAR | Status: AC
  Administered 2018-09-18: 05:00:00 5 mL via INTRADERMAL
  Filled 2018-09-18: qty 30

## 2018-09-18 MED ORDER — TETANUS-DIPHTH-ACELL PERTUSSIS 5-2.5-18.5 LF-MCG/0.5 IM SUSP
0.5000 mL | Freq: Once | INTRAMUSCULAR | Status: AC
  Administered 2018-09-18: 0.5 mL via INTRAMUSCULAR
  Filled 2018-09-18: qty 0.5

## 2018-09-18 MED ORDER — IBUPROFEN 200 MG PO TABS
600.0000 mg | ORAL_TABLET | Freq: Once | ORAL | Status: AC
  Administered 2018-09-18: 06:00:00 600 mg via ORAL
  Filled 2018-09-18: qty 3

## 2018-09-18 NOTE — ED Notes (Signed)
Pt states: "I don't need to pee but I'm thirsty. Can I have some water?".
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
## 2018-09-18 NOTE — Discharge Instructions (Signed)
Keep your sutures clean and dry.  Have your primary care doctor take them out in 1 week. Follow-up with the orthopedist about your clavicle fracture-- call their office to make appt.  Wear sling for comfort, this will help keep it better aligned. Return here for any new/acute changes.

## 2018-09-18 NOTE — ED Notes (Signed)
Attempt x 2 to obtain IV access and labs unsuccessful-patient states he does not want to be stuck anymore-Lisa EDPA made aware-patient given pitcher of water to drink

## 2018-09-18 NOTE — ED Notes (Signed)
Pt given pitcher of water.
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
## 2018-09-18 NOTE — ED Notes (Signed)
Pt has been given 4 cups of water and finished them all. Pt reminded he needs to void for urine specimen.

## 2018-09-18 NOTE — ED Notes (Signed)
Pt ambulated to BR with no assist. Tolerated well. Pt voided 331mL

## 2018-10-06 ENCOUNTER — Other Ambulatory Visit: Payer: Self-pay

## 2018-10-06 ENCOUNTER — Emergency Department (HOSPITAL_COMMUNITY)
Admission: EM | Admit: 2018-10-06 | Discharge: 2018-10-06 | Disposition: A | Discharge status: Home / Self Care | Payer: Medicare Other | Attending: Emergency Medicine | Admitting: Emergency Medicine

## 2018-10-06 ENCOUNTER — Encounter (HOSPITAL_COMMUNITY): Payer: Self-pay

## 2018-10-06 DIAGNOSIS — Y999 Unspecified external cause status: Secondary | ICD-10-CM | POA: Insufficient documentation

## 2018-10-06 DIAGNOSIS — S4991XA Unspecified injury of right shoulder and upper arm, initial encounter: Secondary | ICD-10-CM | POA: Diagnosis present

## 2018-10-06 DIAGNOSIS — E1165 Type 2 diabetes mellitus with hyperglycemia: Secondary | ICD-10-CM | POA: Insufficient documentation

## 2018-10-06 DIAGNOSIS — I10 Essential (primary) hypertension: Secondary | ICD-10-CM | POA: Diagnosis not present

## 2018-10-06 DIAGNOSIS — Z79899 Other long term (current) drug therapy: Secondary | ICD-10-CM | POA: Diagnosis not present

## 2018-10-06 DIAGNOSIS — Y9389 Activity, other specified: Secondary | ICD-10-CM | POA: Diagnosis not present

## 2018-10-06 DIAGNOSIS — Z794 Long term (current) use of insulin: Secondary | ICD-10-CM | POA: Diagnosis not present

## 2018-10-06 DIAGNOSIS — F17210 Nicotine dependence, cigarettes, uncomplicated: Secondary | ICD-10-CM | POA: Diagnosis not present

## 2018-10-06 DIAGNOSIS — Z7982 Long term (current) use of aspirin: Secondary | ICD-10-CM | POA: Diagnosis not present

## 2018-10-06 DIAGNOSIS — R739 Hyperglycemia, unspecified: Secondary | ICD-10-CM

## 2018-10-06 DIAGNOSIS — Y929 Unspecified place or not applicable: Secondary | ICD-10-CM | POA: Diagnosis not present

## 2018-10-06 DIAGNOSIS — S42121A Displaced fracture of acromial process, right shoulder, initial encounter for closed fracture: Secondary | ICD-10-CM | POA: Insufficient documentation

## 2018-10-06 DIAGNOSIS — S42031D Displaced fracture of lateral end of right clavicle, subsequent encounter for fracture with routine healing: Secondary | ICD-10-CM

## 2018-10-06 LAB — CBG MONITORING, ED: Glucose-Capillary: 305 mg/dL — ABNORMAL HIGH (ref 70–99)

## 2018-10-06 MED ORDER — ACETAMINOPHEN 325 MG PO TABS
650.0000 mg | ORAL_TABLET | Freq: Once | ORAL | Status: AC
  Administered 2018-10-06: 650 mg via ORAL
  Filled 2018-10-06: qty 2

## 2018-10-06 NOTE — Discharge Instructions (Addendum)
You have a broken collar bone.  It is VERY important that you wear a sling at all times at home, even when sleeping.  You need to see an orthopedic (bone) doctor.  We gave you their phone number.  Your blood sugar was also high today (300).  Please follow up with your doctor for this.

## 2018-10-06 NOTE — ED Triage Notes (Addendum)
Patient c/o fall more than a week ago. Patient states he has increased pain to the right shoulder. Patient states he has been unable to follow up with ortho.   patient stated that he is out of pain meds. Patient has his sling in his hand  And states it is not doing mr any good.
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
## 2019-02-26 ENCOUNTER — Emergency Department (HOSPITAL_COMMUNITY): Payer: Medicare (Managed Care)

## 2019-02-26 ENCOUNTER — Inpatient Hospital Stay (HOSPITAL_COMMUNITY)
Admission: EM | Admit: 2019-02-26 | Discharge: 2019-02-27 | DRG: 100 | Discharge status: Against Medical Advice | Payer: Medicare (Managed Care) | Attending: Pulmonary Disease | Admitting: Pulmonary Disease

## 2019-02-26 ENCOUNTER — Other Ambulatory Visit: Payer: Self-pay

## 2019-02-26 DIAGNOSIS — K219 Gastro-esophageal reflux disease without esophagitis: Secondary | ICD-10-CM | POA: Diagnosis present

## 2019-02-26 DIAGNOSIS — Z8249 Family history of ischemic heart disease and other diseases of the circulatory system: Secondary | ICD-10-CM | POA: Diagnosis not present

## 2019-02-26 DIAGNOSIS — J988 Other specified respiratory disorders: Secondary | ICD-10-CM | POA: Diagnosis not present

## 2019-02-26 DIAGNOSIS — Z7982 Long term (current) use of aspirin: Secondary | ICD-10-CM

## 2019-02-26 DIAGNOSIS — J9602 Acute respiratory failure with hypercapnia: Secondary | ICD-10-CM

## 2019-02-26 DIAGNOSIS — G9341 Metabolic encephalopathy: Secondary | ICD-10-CM | POA: Diagnosis present

## 2019-02-26 DIAGNOSIS — R569 Unspecified convulsions: Secondary | ICD-10-CM

## 2019-02-26 DIAGNOSIS — R54 Age-related physical debility: Secondary | ICD-10-CM | POA: Diagnosis present

## 2019-02-26 DIAGNOSIS — R4182 Altered mental status, unspecified: Secondary | ICD-10-CM

## 2019-02-26 DIAGNOSIS — E876 Hypokalemia: Secondary | ICD-10-CM

## 2019-02-26 DIAGNOSIS — J9600 Acute respiratory failure, unspecified whether with hypoxia or hypercapnia: Secondary | ICD-10-CM

## 2019-02-26 DIAGNOSIS — Z794 Long term (current) use of insulin: Secondary | ICD-10-CM | POA: Diagnosis not present

## 2019-02-26 DIAGNOSIS — E1100 Type 2 diabetes mellitus with hyperosmolarity without nonketotic hyperglycemic-hyperosmolar coma (NKHHC): Secondary | ICD-10-CM

## 2019-02-26 DIAGNOSIS — Z833 Family history of diabetes mellitus: Secondary | ICD-10-CM

## 2019-02-26 DIAGNOSIS — Z20822 Contact with and (suspected) exposure to covid-19: Secondary | ICD-10-CM | POA: Diagnosis present

## 2019-02-26 DIAGNOSIS — Z79899 Other long term (current) drug therapy: Secondary | ICD-10-CM | POA: Diagnosis not present

## 2019-02-26 DIAGNOSIS — J9601 Acute respiratory failure with hypoxia: Secondary | ICD-10-CM

## 2019-02-26 DIAGNOSIS — E1165 Type 2 diabetes mellitus with hyperglycemia: Secondary | ICD-10-CM

## 2019-02-26 DIAGNOSIS — F209 Schizophrenia, unspecified: Secondary | ICD-10-CM | POA: Diagnosis present

## 2019-02-26 DIAGNOSIS — K08109 Complete loss of teeth, unspecified cause, unspecified class: Secondary | ICD-10-CM | POA: Diagnosis present

## 2019-02-26 DIAGNOSIS — I10 Essential (primary) hypertension: Secondary | ICD-10-CM | POA: Diagnosis present

## 2019-02-26 DIAGNOSIS — F419 Anxiety disorder, unspecified: Secondary | ICD-10-CM | POA: Diagnosis present

## 2019-02-26 DIAGNOSIS — R64 Cachexia: Secondary | ICD-10-CM | POA: Diagnosis present

## 2019-02-26 DIAGNOSIS — G4089 Other seizures: Principal | ICD-10-CM | POA: Diagnosis present

## 2019-02-26 DIAGNOSIS — F319 Bipolar disorder, unspecified: Secondary | ICD-10-CM | POA: Diagnosis present

## 2019-02-26 DIAGNOSIS — Z681 Body mass index (BMI) 19 or less, adult: Secondary | ICD-10-CM | POA: Diagnosis not present

## 2019-02-26 DIAGNOSIS — F17210 Nicotine dependence, cigarettes, uncomplicated: Secondary | ICD-10-CM | POA: Diagnosis present

## 2019-02-26 DIAGNOSIS — E1142 Type 2 diabetes mellitus with diabetic polyneuropathy: Secondary | ICD-10-CM | POA: Diagnosis present

## 2019-02-26 LAB — GLUCOSE, CAPILLARY
Glucose-Capillary: >600 mg/dL (ref 70–99)
Glucose-Capillary: 581 mg/dL (ref 70–99)
Glucose-Capillary: 583 mg/dL (ref 70–99)
Glucose-Capillary: 446 mg/dL — ABNORMAL HIGH (ref 70–99)
Glucose-Capillary: 305 mg/dL — ABNORMAL HIGH (ref 70–99)
Glucose-Capillary: 335 mg/dL — ABNORMAL HIGH (ref 70–99)
Glucose-Capillary: 200 mg/dL — ABNORMAL HIGH (ref 70–99)
Glucose-Capillary: 119 mg/dL — ABNORMAL HIGH (ref 70–99)

## 2019-02-26 LAB — URINALYSIS, ROUTINE W REFLEX MICROSCOPIC
Bilirubin Urine: NEGATIVE
Glucose, UA: >=500 mg/dL — AB
Hgb urine dipstick: NEGATIVE
Ketones, ur: NEGATIVE mg/dL
Leukocytes,Ua: NEGATIVE
Nitrite: NEGATIVE
Protein, ur: NEGATIVE mg/dL
Specific Gravity, Urine: 1.025 (ref 1.005–1.030)
pH: 7 (ref 5.0–8.0)

## 2019-02-26 LAB — I-STAT CHEM 8, ED
BUN: 5 mg/dL — ABNORMAL LOW (ref 8–23)
Calcium, Ion: 1.02 mmol/L — ABNORMAL LOW (ref 1.15–1.40)
Chloride: 92 mmol/L — ABNORMAL LOW (ref 98–111)
Creatinine, Ser: 0.6 mg/dL — ABNORMAL LOW (ref 0.61–1.24)
Glucose, Bld: >700 mg/dL (ref 70–99)
HCT: 45 % (ref 39.0–52.0)
Hemoglobin: 15.3 g/dL (ref 13.0–17.0)
Potassium: 2.3 mmol/L — CL (ref 3.5–5.1)
Sodium: 130 mmol/L — ABNORMAL LOW (ref 135–145)
TCO2: 18 mmol/L — ABNORMAL LOW (ref 22–32)

## 2019-02-26 LAB — DIFFERENTIAL
Abs Immature Granulocytes: 0.05 10*3/uL (ref 0.00–0.07)
Basophils Absolute: 0 10*3/uL (ref 0.0–0.1)
Basophils Relative: 0 %
Eosinophils Absolute: 0.1 10*3/uL (ref 0.0–0.5)
Eosinophils Relative: 1 %
Immature Granulocytes: 1 %
Lymphocytes Relative: 33 %
Lymphs Abs: 2.6 10*3/uL (ref 0.7–4.0)
Monocytes Absolute: 0.6 10*3/uL (ref 0.1–1.0)
Monocytes Relative: 7 %
Neutro Abs: 4.7 10*3/uL (ref 1.7–7.7)
Neutrophils Relative %: 58 %

## 2019-02-26 LAB — COMPREHENSIVE METABOLIC PANEL
ALT: 71 U/L — ABNORMAL HIGH (ref 0–44)
AST: 75 U/L — ABNORMAL HIGH (ref 15–41)
Albumin: 2.9 g/dL — ABNORMAL LOW (ref 3.5–5.0)
Alkaline Phosphatase: 316 U/L — ABNORMAL HIGH (ref 38–126)
Anion gap: 20 — ABNORMAL HIGH (ref 5–15)
BUN: 5 mg/dL — ABNORMAL LOW (ref 8–23)
CO2: 17 mmol/L — ABNORMAL LOW (ref 22–32)
Calcium: 7.8 mg/dL — ABNORMAL LOW (ref 8.9–10.3)
Chloride: 92 mmol/L — ABNORMAL LOW (ref 98–111)
Creatinine, Ser: 1.01 mg/dL (ref 0.61–1.24)
GFR calc Af Amer: >60 mL/min (ref >60)
GFR calc non Af Amer: >60 mL/min (ref >60)
Glucose, Bld: 1175 mg/dL (ref 70–99)
Potassium: 2.4 mmol/L — CL (ref 3.5–5.1)
Sodium: 129 mmol/L — ABNORMAL LOW (ref 135–145)
Total Bilirubin: 0.9 mg/dL (ref 0.3–1.2)
Total Protein: 5.8 g/dL — ABNORMAL LOW (ref 6.5–8.1)

## 2019-02-26 LAB — POCT I-STAT 7, (LYTES, BLD GAS, ICA,H+H)
Acid-base deficit: 1 mmol/L (ref 0.0–2.0)
Bicarbonate: 26.6 mmol/L (ref 20.0–28.0)
Calcium, Ion: 1.1 mmol/L — ABNORMAL LOW (ref 1.15–1.40)
HCT: 42 % (ref 39.0–52.0)
Hemoglobin: 14.3 g/dL (ref 13.0–17.0)
O2 Saturation: 100 %
Potassium: 3.4 mmol/L — ABNORMAL LOW (ref 3.5–5.1)
Sodium: 134 mmol/L — ABNORMAL LOW (ref 135–145)
TCO2: 28 mmol/L (ref 22–32)
pCO2 arterial: 54.3 mmHg — ABNORMAL HIGH (ref 32.0–48.0)
pH, Arterial: 7.299 — ABNORMAL LOW (ref 7.350–7.450)
pO2, Arterial: 570 mmHg — ABNORMAL HIGH (ref 83.0–108.0)

## 2019-02-26 LAB — CBC
HCT: 44.5 % (ref 39.0–52.0)
Hemoglobin: 14.2 g/dL (ref 13.0–17.0)
MCH: 32.5 pg (ref 26.0–34.0)
MCHC: 31.9 g/dL (ref 30.0–36.0)
MCV: 101.8 fL — ABNORMAL HIGH (ref 80.0–100.0)
Platelets: 193 10*3/uL (ref 150–400)
RBC: 4.37 MIL/uL (ref 4.22–5.81)
RDW: 15.2 % (ref 11.5–15.5)
WBC: 8.1 10*3/uL (ref 4.0–10.5)
nRBC: 0 % (ref 0.0–0.2)

## 2019-02-26 LAB — RESPIRATORY PANEL BY RT PCR (FLU A&B, COVID)
Influenza A by PCR: NEGATIVE
Influenza B by PCR: NEGATIVE
SARS Coronavirus 2 by RT PCR: NEGATIVE

## 2019-02-26 LAB — RAPID URINE DRUG SCREEN, HOSP PERFORMED
Amphetamines: NOT DETECTED
Barbiturates: NOT DETECTED
Benzodiazepines: POSITIVE — AB
Cocaine: NOT DETECTED
Opiates: NOT DETECTED
Tetrahydrocannabinol: NOT DETECTED

## 2019-02-26 LAB — LIPASE, BLOOD: Lipase: 12 U/L (ref 11–51)

## 2019-02-26 LAB — CBG MONITORING, ED
Glucose-Capillary: >600 mg/dL (ref 70–99)
Glucose-Capillary: >600 mg/dL (ref 70–99)
Glucose-Capillary: >600 mg/dL (ref 70–99)
Glucose-Capillary: >600 mg/dL (ref 70–99)

## 2019-02-26 LAB — BETA-HYDROXYBUTYRIC ACID: Beta-Hydroxybutyric Acid: 0.12 mmol/L (ref 0.05–0.27)

## 2019-02-26 LAB — BASIC METABOLIC PANEL
Anion gap: 11 (ref 5–15)
BUN: <5 mg/dL — ABNORMAL LOW (ref 8–23)
CO2: 24 mmol/L (ref 22–32)
Calcium: 8 mg/dL — ABNORMAL LOW (ref 8.9–10.3)
Chloride: 109 mmol/L (ref 98–111)
Creatinine, Ser: 0.67 mg/dL (ref 0.61–1.24)
GFR calc Af Amer: >60 mL/min (ref >60)
GFR calc non Af Amer: >60 mL/min (ref >60)
Glucose, Bld: 349 mg/dL — ABNORMAL HIGH (ref 70–99)
Potassium: 2.9 mmol/L — ABNORMAL LOW (ref 3.5–5.1)
Sodium: 144 mmol/L (ref 135–145)

## 2019-02-26 LAB — MRSA PCR SCREENING: MRSA by PCR: NEGATIVE

## 2019-02-26 LAB — POC SARS CORONAVIRUS 2 AG -  ED: SARS Coronavirus 2 Ag: NEGATIVE

## 2019-02-26 LAB — PROTIME-INR
INR: 1.2 (ref 0.8–1.2)
Prothrombin Time: 14.9 seconds (ref 11.4–15.2)

## 2019-02-26 LAB — LACTIC ACID, PLASMA: Lactic Acid, Venous: 6.4 mmol/L (ref 0.5–1.9)

## 2019-02-26 LAB — HIV ANTIBODY (ROUTINE TESTING W REFLEX): HIV Screen 4th Generation wRfx: NONREACTIVE

## 2019-02-26 LAB — PROCALCITONIN: Procalcitonin: <0.1 ng/mL

## 2019-02-26 LAB — APTT: aPTT: 29 seconds (ref 24–36)

## 2019-02-26 LAB — OSMOLALITY: Osmolality: 336 mOsm/kg (ref 275–295)

## 2019-02-26 MED ORDER — DULOXETINE HCL 60 MG PO CPEP: 60.0000 mg | ORAL_CAPSULE | Freq: Every day | ORAL | Status: DC

## 2019-02-26 MED ORDER — DULOXETINE HCL 30 MG PO CPEP
30.0000 mg | ORAL_CAPSULE | Freq: Every day | ORAL | Status: DC
  Filled 2019-02-26: qty 1

## 2019-02-26 MED ORDER — DEXTROSE-NACL 5-0.45 % IV SOLN: INTRAVENOUS | Status: DC

## 2019-02-26 MED ORDER — ARIPIPRAZOLE 2 MG PO TABS
2.0000 mg | ORAL_TABLET | Freq: Every day | ORAL | Status: DC
  Administered 2019-02-27: 08:00:00 2 mg
  Filled 2019-02-26: qty 1

## 2019-02-26 MED ORDER — CHLORHEXIDINE GLUCONATE 0.12% ORAL RINSE (MEDLINE KIT)
15.0000 mL | Freq: Two times a day (BID) | OROMUCOSAL | Status: DC
  Administered 2019-02-26 – 2019-02-27 (×2): 15 mL via OROMUCOSAL

## 2019-02-26 MED ORDER — DEXTROSE 50 % IV SOLN: 0.0000 mL | INTRAVENOUS | Status: DC | PRN

## 2019-02-26 MED ORDER — SODIUM CHLORIDE 0.9 % IV SOLN: INTRAVENOUS | Status: DC

## 2019-02-26 MED ORDER — SODIUM CHLORIDE 0.9 % IV SOLN
2000.0000 mg | Freq: Once | INTRAVENOUS | Status: AC
  Administered 2019-02-26: 15:00:00 2000 mg via INTRAVENOUS
  Filled 2019-02-26: qty 20

## 2019-02-26 MED ORDER — LEVETIRACETAM IN NACL 500 MG/100ML IV SOLN
500.0000 mg | Freq: Two times a day (BID) | INTRAVENOUS | Status: DC
  Administered 2019-02-27: 03:00:00 500 mg via INTRAVENOUS
  Filled 2019-02-26 (×3): qty 100

## 2019-02-26 MED ORDER — POTASSIUM CHLORIDE 10 MEQ/100ML IV SOLN
10.0000 meq | INTRAVENOUS | Status: DC
  Filled 2019-02-26: qty 100

## 2019-02-26 MED ORDER — GABAPENTIN 300 MG PO CAPS
900.0000 mg | ORAL_CAPSULE | Freq: Three times a day (TID) | ORAL | Status: DC
  Administered 2019-02-26 – 2019-02-27 (×3): 900 mg
  Filled 2019-02-26 (×3): qty 3

## 2019-02-26 MED ORDER — HEPARIN SODIUM (PORCINE) 5000 UNIT/ML IJ SOLN
5000.0000 [IU] | Freq: Three times a day (TID) | INTRAMUSCULAR | Status: DC
  Administered 2019-02-26 – 2019-02-27 (×3): 5000 [IU] via SUBCUTANEOUS
  Filled 2019-02-26 (×3): qty 1

## 2019-02-26 MED ORDER — POTASSIUM CHLORIDE IN NACL 20-0.9 MEQ/L-% IV SOLN
Freq: Once | INTRAVENOUS | Status: AC
  Filled 2019-02-26: qty 1000

## 2019-02-26 MED ORDER — ARIPIPRAZOLE 5 MG PO TABS: 5.0000 mg | ORAL_TABLET | Freq: Every day | ORAL | Status: DC

## 2019-02-26 MED ORDER — FENTANYL CITRATE (PF) 100 MCG/2ML IJ SOLN
INTRAMUSCULAR | Status: AC
  Filled 2019-02-26: qty 2

## 2019-02-26 MED ORDER — LACTATED RINGERS IV SOLN: INTRAVENOUS | Status: DC

## 2019-02-26 MED ORDER — INSULIN REGULAR(HUMAN) IN NACL 100-0.9 UT/100ML-% IV SOLN
INTRAVENOUS | Status: DC
  Administered 2019-02-26: 4.4 [IU]/h via INTRAVENOUS
  Filled 2019-02-26 (×2): qty 100

## 2019-02-26 MED ORDER — PANTOPRAZOLE SODIUM 40 MG IV SOLR
40.0000 mg | Freq: Every day | INTRAVENOUS | Status: DC
  Administered 2019-02-26 – 2019-02-27 (×2): 40 mg via INTRAVENOUS
  Filled 2019-02-26 (×2): qty 40

## 2019-02-26 MED ORDER — POTASSIUM CHLORIDE 10 MEQ/100ML IV SOLN: 10.0000 meq | INTRAVENOUS | Status: DC

## 2019-02-26 MED ORDER — MIDAZOLAM HCL 2 MG/2ML IJ SOLN
INTRAMUSCULAR | Status: AC
  Administered 2019-02-26: 17:00:00 2 mg
  Filled 2019-02-26: qty 2

## 2019-02-26 MED ORDER — FOLIC ACID 1 MG PO TABS
1.0000 mg | ORAL_TABLET | Freq: Every day | ORAL | Status: DC
  Administered 2019-02-26: 18:00:00 1 mg
  Filled 2019-02-26: qty 1

## 2019-02-26 MED ORDER — MIDAZOLAM HCL 2 MG/2ML IJ SOLN: 2.0000 mg | INTRAMUSCULAR | Status: DC | PRN

## 2019-02-26 MED ORDER — ETOMIDATE 2 MG/ML IV SOLN
INTRAVENOUS | Status: AC | PRN
  Administered 2019-02-26: 20 mg via INTRAVENOUS

## 2019-02-26 MED ORDER — ORAL CARE MOUTH RINSE
15.0000 mL | OROMUCOSAL | Status: DC
  Administered 2019-02-26 – 2019-02-27 (×8): 15 mL via OROMUCOSAL

## 2019-02-26 MED ORDER — SODIUM CHLORIDE 0.9% FLUSH
3.0000 mL | Freq: Once | INTRAVENOUS | Status: AC
Start: 2019-02-26 — End: 2019-02-26
  Administered 2019-02-26: 18:00:00 3 mL via INTRAVENOUS

## 2019-02-26 MED ORDER — POTASSIUM CHLORIDE 10 MEQ/100ML IV SOLN
10.0000 meq | INTRAVENOUS | Status: AC
  Administered 2019-02-26 (×5): 10 meq via INTRAVENOUS
  Filled 2019-02-26 (×4): qty 100

## 2019-02-26 MED ORDER — FENTANYL CITRATE (PF) 100 MCG/2ML IJ SOLN: 50.0000 ug | INTRAMUSCULAR | Status: DC | PRN

## 2019-02-26 MED ORDER — CHLORHEXIDINE GLUCONATE CLOTH 2 % EX PADS
6.0000 | MEDICATED_PAD | Freq: Every day | CUTANEOUS | Status: DC
  Administered 2019-02-26: 18:00:00 6 via TOPICAL

## 2019-02-26 MED ORDER — ROCURONIUM BROMIDE 50 MG/5ML IV SOLN
INTRAVENOUS | Status: AC | PRN
  Administered 2019-02-26: 100 mg via INTRAVENOUS

## 2019-02-26 MED ORDER — THIAMINE HCL 100 MG PO TABS
100.0000 mg | ORAL_TABLET | Freq: Every day | ORAL | Status: DC
  Administered 2019-02-26 – 2019-02-27 (×2): 100 mg
  Filled 2019-02-26 (×2): qty 1

## 2019-02-26 MED ORDER — IOHEXOL 350 MG/ML SOLN
75.0000 mL | Freq: Once | INTRAVENOUS | Status: AC | PRN
  Administered 2019-02-26: 75 mL via INTRAVENOUS

## 2019-02-26 MED ORDER — DEXTROSE IN LACTATED RINGERS 5 % IV SOLN: INTRAVENOUS | Status: DC

## 2019-02-26 MED ORDER — MIDAZOLAM HCL 2 MG/2ML IJ SOLN
2.0000 mg | INTRAMUSCULAR | Status: AC | PRN
  Administered 2019-02-26 – 2019-02-27 (×3): 2 mg via INTRAVENOUS
  Filled 2019-02-26 (×3): qty 2

## 2019-02-26 MED ORDER — POTASSIUM CHLORIDE 10 MEQ/100ML IV SOLN
10.0000 meq | INTRAVENOUS | Status: DC
  Administered 2019-02-26: 10 meq via INTRAVENOUS

## 2019-02-26 MED ORDER — FENTANYL CITRATE (PF) 100 MCG/2ML IJ SOLN
50.0000 ug | INTRAMUSCULAR | Status: DC | PRN
  Administered 2019-02-26 (×2): 100 ug via INTRAVENOUS
  Filled 2019-02-26: qty 4

## 2019-02-26 NOTE — ED Notes (Signed)
Phineas Real [phone]  Niece -- states pt lives with her. States pt noncompliant with diet but does take insulin.
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
## 2019-02-26 NOTE — Consult Note (Signed)
Neurology Consultation  Reason for Consult: Code stroke/possible provoked seizure Referring Physician: Dr. Vanita Panda  CC: Stroke  History is obtained from: EMS  HPI: Michael [name] is a 64 y.o. male with a history of malignant hypertension who has not been taking his medications.  Patient was in a cab when at approximately 1320 was noted to have shaking-like movements and then slumped over to the right.  EMS was called and they noted a left gaze preference.  While en route patient again had a tonic-clonic seizure which lasted for approximately 1 minute.  Upon entering the bridge patient was obtunded with disconjugate gaze, leaning to the right, mouth open and drooling on the right.  He was flaccid throughout and had no response to pain.  It is noted that he has no history of seizures.  He however did have a blood glucose of > 600 per EMS. Labs in the ED then revealed a blood glucose of 700 as well as hypokalemia with a potassium of 2.3.  STAT CT, CTA head and neck were obtained on arrival.   ED course  Relevant labs include -CBG 600  Chart review (no neurological chart to evaluate)  LKW: 1300 hrs. tpa given?: no, as presentation more consistent with seizure with risks of tPA significantly outweighing potential benefits.  Premorbid modified Rankin scale (mRS): 0 NIH stroke score: 31   Past Medical History:  Diagnosis Date  . Anxiety   . Arthritis    "joints; shoulders; feet" (06/08/2016)  . Bipolar disorder ([place])   . Daily headache    "7:30 - 8:00 q night" (06/08/2016)  . Diabetic peripheral neuropathy ([place])   . GERD (gastroesophageal reflux disease)   . History of hepatitis B virus infection conferring immunity 03/22/2018  . Pancreatitis   . Schizophrenia ([place])   . Seizures ([place]) 01/2016 X 2  . Type II diabetes mellitus ([facility])      Family History  Problem Relation Age of Onset  . Hypertension Sister   . Diabetes Sister   . Heart disease Sister   . Colon cancer Neg Hx   .  Stomach cancer Neg Hx   . Anesthesia problems Neg Hx   . Hypotension Neg Hx   . Pseudochol deficiency Neg Hx   . Malignant hyperthermia Neg Hx     Social History:   reports that he has been smoking cigarettes. He has a 21.00 pack-year smoking history. He has never used smokeless tobacco. He reports current alcohol use of about 2.0 standard drinks of alcohol per week. He reports previous drug use. Drug: Marijuana.  Medications  Current Facility-Administered Medications:  .  levETIRAcetam (KEPPRA) 2,000 mg in sodium chloride 0.9 % 250 mL IVPB, 2,000 mg, Intravenous, Once, Kerney Elbe, MD .  Derrill Memo ON 02/27/2019] levETIRAcetam (KEPPRA) IVPB 500 mg/100 mL premix, 500 mg, Intravenous, Q12H, Kerney Elbe, MD .  sodium chloride flush (NS) 0.9 % injection 3 mL, 3 mL, Intravenous, Once, Carmin Muskrat, MD  Current Outpatient Medications:  .  ACCU-CHEK SOFTCLIX LANCETS lancets, Use 3 times daily to check blood sugar. diag code E11.42. Insulin dependent, Disp: 100 each, Rfl: 5 .  Alcohol Swabs (B-D SINGLE USE SWABS REGULAR) PADS, Patient uses 6 times a day. Patient tests 3 times per day and administers insulin 3 times a day. ICD 10 code E11.42, Disp: 200 each, Rfl: 5 .  ammonium lactate (LAC-HYDRIN) 12 % cream, Apply topically as needed for dry skin., Disp: 385 g, Rfl: 0 .  ARIPiprazole (ABILIFY) 5 MG  tablet, Take 1 tablet (5 mg total) by mouth daily., Disp: 30 tablet, Rfl: 5 .  aspirin EC 81 MG tablet, Take by mouth., Disp: , Rfl:  .  BELBUCA 450 MCG FILM, Place 1 Film inside cheek every 12 (twelve) hours., Disp: , Rfl: 0 .  Blood Glucose Calibration (ACCU-CHEK AVIVA) SOLN, 1 drop by In Vitro route as needed. diag code E08.10 insulin dependent. Use as device directs, Disp: 1 each, Rfl: 4 .  Blood Glucose Monitoring Suppl (ACCU-CHEK AVIVA PLUS) w/Device KIT, 1 kit by Does not apply route every morning. diag code E11.42. Insulin dependent, Disp: 1 kit, Rfl: 0 .  diclofenac sodium (VOLTAREN) 1 % GEL,  Apply 2 g topically 4 (four) times daily -  before meals and at bedtime., Disp: 100 g, Rfl: 5 .  DULoxetine (CYMBALTA) 60 MG capsule, Take 1 capsule (60 mg total) by mouth daily., Disp: 30 capsule, Rfl: 3 .  feeding supplement, GLUCERNA SHAKE, (GLUCERNA SHAKE) LIQD, Take 237 mLs by mouth 3 (three) times daily between meals., Disp: 90 Can, Rfl: 2 .  folic acid (FOLVITE) 1 MG tablet, Take 1 tablet (1 mg total) by mouth daily., Disp: 30 tablet, Rfl: 5 .  gabapentin (NEURONTIN) 300 MG capsule, TAKE 3 CAPSULES BY MOUTH  EVERY MORNING AND  AFTERNOON. TAKE 4 CAPSULES  BY MOUTH AT NIGHT BEFORE  BED (Patient taking differently: Take 900-1,200 mg by mouth 3 (three) times daily. TAKE 3 CAPSULES BY MOUTH  EVERY MORNING AND  AFTERNOON. TAKE 4 CAPSULES  BY MOUTH AT NIGHT BEFORE  BED), Disp: 300 capsule, Rfl: 5 .  glipiZIDE (GLUCOTROL) 10 MG tablet, Take by mouth., Disp: , Rfl:  .  glucose blood (ACCU-CHEK AVIVA PLUS) test strip, Use 3 times daily to check blood sugar. diag code E11.42. Insulin dependent, Disp: 300 each, Rfl: 12 .  hydrOXYzine (ATARAX/VISTARIL) 10 MG tablet, Take 10 mg by mouth 3 (three) times daily as needed for anxiety. , Disp: , Rfl:  .  ibuprofen (ADVIL,MOTRIN) 800 MG tablet, Take 800 mg by mouth 3 (three) times daily as needed for mild pain. , Disp: , Rfl:  .  insulin aspart (NOVOLOG) 100 UNIT/ML FlexPen, Take 5 units before breakfast,   before lunch, and 5 units before dinner (5 units before each meal) (Patient taking differently: Inject 5-10 Units into the skin 3 (three) times daily with meals. Take 5 units before breakfast and 10 units before lunch and dinner), Disp: 3 mL, Rfl: 3 .  LEVEMIR FLEXTOUCH 100 UNIT/ML Pen, 4 units Twice a day, Disp: 15 mL, Rfl: 0 .  lipase/protease/amylase (CREON) 36000 UNITS CPEP capsule, TAKE 1 CAPSULE BY MOUTH 3  TIMES DAILY BEFORE MEALS, Disp: 300 capsule, Rfl: 1 .  Multiple Vitamin (MULTIVITAMIN WITH MINERALS) TABS tablet, Take 1 tablet by mouth daily., Disp: 30  tablet, Rfl: 4 .  naloxone (NARCAN) nasal spray 4 mg/0.1 mL, as needed., Disp: , Rfl:  .  omega-3 acid ethyl esters (LOVAZA) 1 g capsule, Take 1 capsule (1 g total) by mouth daily., Disp: 30 capsule, Rfl: 3 .  oxyCODONE-acetaminophen (PERCOCET/ROXICET) 5-325 MG tablet, Take 1 tablet by mouth every 12 (twelve) hours as needed for moderate pain. , Disp: , Rfl: 0 .  pantoprazole (PROTONIX) 40 MG tablet, Take 1 tablet (40 mg total) by mouth daily., Disp: 30 tablet, Rfl: 5 .  pravastatin (PRAVACHOL) 10 MG tablet, Take 1 tablet (10 mg total) by mouth daily at 6 PM., Disp: 30 tablet, Rfl: 5 .  QUEtiapine Fumarate (  SEROQUEL XR) 150 MG 24 hr tablet, Take by mouth., Disp: , Rfl:  .  thiamine (VITAMIN B-1) 100 MG tablet, Take 1 tablet (100 mg total) by mouth daily., Disp: 30 tablet, Rfl: 5  ROS: Unable to obtain due to altered mental status.   Exam: Current vital signs: Wt 47.9 kg   BMI 18.13 kg/m  Vital signs in last 24 hours: Weight:  [47.9 kg] 47.9 kg (02/03 1300)   Constitutional: Cachectic Psych: Obtunded Eyes: No scleral injection HENT: No OP obstrucion Head: Normocephalic.  Cardiovascular: Normal rate and regular rhythm.  Respiratory: Effort normal, non-labored breathing GI: Soft.  No distension. There is no tenderness.  Skin: WDI  Neuro: Mental Status: Patient is obtunded, does not respond to noxious stimuli. Cranial Nerves: II: No blink to threat III,IV, VI: PERRL with disconjugate gaze and intact dolls reflex V: No response to noxious stimuli VII: Head tilted to the right; lower quadrants of face flaccidly asymmetric. Pooled mucus to right side of labial fissure VIII: No response to verbal stimuli secondary to altered mental status Motor: Triple reflex responses to plantar stimulation bilateral lower extremities.  No responses to thigh pinch bilaterally. No purposeful movements. No responses to noxious stimuli in upper extremities. Sensory: No response to noxious stimuli in  all 4 extremities other than triple reflex to plantar stimulation Deep Tendon Reflexes: 2+ and symmetric in the biceps and patellae.  Plantars: Upgoing toes bilaterally Cerebellar: Unable to assess  Labs I have reviewed labs in epic and the results pertinent to this consultation are:  CBC    Component Value Date/Time   WBC 8.1 02/26/2019 1344   RBC 4.37 02/26/2019 1344   HGB 15.3 02/26/2019 1350   HGB 15.1 10/13/2016 1446   HCT 45.0 02/26/2019 1350   HCT 46.4 10/13/2016 1446   PLT 193 02/26/2019 1344   PLT 189 10/13/2016 1446   MCV 101.8 (H) 02/26/2019 1344   MCV 97 10/13/2016 1446   MCH 32.5 02/26/2019 1344   MCHC 31.9 02/26/2019 1344   RDW 15.2 02/26/2019 1344   RDW 14.3 10/13/2016 1446   LYMPHSABS 2.6 02/26/2019 1344   MONOABS 0.6 02/26/2019 1344   EOSABS 0.1 02/26/2019 1344   BASOSABS 0.0 02/26/2019 1344    CMP     Component Value Date/Time   NA 130 (L) 02/26/2019 1350   NA 140 10/13/2016 1446   K 2.3 (LL) 02/26/2019 1350   CL 92 (L) 02/26/2019 1350   CO2 24 02/02/2018 1958   GLUCOSE >700 (HH) 02/26/2019 1350   BUN 5 (L) 02/26/2019 1350   BUN 11 10/13/2016 1446   CREATININE 0.60 (L) 02/26/2019 1350   CREATININE 0.61 08/02/2011 1552   CALCIUM 8.6 (L) 02/02/2018 1958   PROT 6.4 (L) 02/02/2018 1958   ALBUMIN 3.8 02/02/2018 1958   AST 65 (H) 02/02/2018 1958   ALT 80 (H) 02/02/2018 1958   ALKPHOS 209 (H) 02/02/2018 1958   BILITOT 1.2 02/02/2018 1958   GFRNONAA >60 02/02/2018 1958   GFRNONAA >89 08/02/2011 1552   GFRAA >60 02/02/2018 1958   GFRAA >89 08/02/2011 1552    Lipid Panel     Component Value Date/Time   CHOL 157 01/19/2014 1338   TRIG 154 (H) 01/19/2014 1338   HDL 48 01/19/2014 1338   CHOLHDL 3.3 01/19/2014 1338   VLDL 31 01/19/2014 1338   LDLCALC 78 01/19/2014 1338   LDLDIRECT 89 06/14/2012 1521     Imaging I have reviewed the images obtained:  CT-scan of the  brain-atrophy with no parenchymal hemorrhage or mass  Etta Quill  PA-C [facility] [phone] 02/26/2019, 2:00 PM    Assessment:  64 year old male who presents to the ED as a code stroke after a witnessed seizure in a vehicle followed by a second seizure en route witnessed by EMS.  1. Most likely etiology for the seizures is severe hyperglycemia. The patient has a CBG of >700. 2. Also noted to be hypokalemic at 2.3.   3. Currently patient is obtunded and not following commands or responding to noxious stimuli.  4. CT head was obtained which shows atrophy without any intraparenchymal hemorrhage.  Recommendations: - Seizure precautions - Loading dose of Keppra 2000 mg followed by 500 mg twice daily for now. After correction of blood glucose levels, Keppra can most likely be stopped/tapered, unless EEG shows underlying seizure focus.   - STAT EEG -- Frequent neuro checks -- Management of hyperglycemia and hypokalemia. DDx includes diabetic ketoacidosis and HONK  I have seen and examined the patient. I have formulated the assessment and recommendations. 64 year old male presenting with seizures x 2, most likely precipitated by severe hyperglycemia. No prior history of seizures. STAT EEG is being obtained. Keppra has been loaded.  Electronically signed: Dr. Kerney Elbe

## 2019-02-26 NOTE — ED Notes (Signed)
Michael [name] [phone] sister

## 2019-02-26 NOTE — Progress Notes (Signed)
Assisted with transport to [facility] ICU. No respiratory issues noted

## 2019-02-26 NOTE — Progress Notes (Signed)
Pt admitted to [facility]. Temp 94. Placed on bair hugger. CBG reads "HI" on meter, on Glucomander.

## 2019-02-26 NOTE — Progress Notes (Signed)
EEG complete - results pending 

## 2019-02-26 NOTE — ED Triage Notes (Signed)
To ED via [facility] from home , had called a cab to go to the store- cabdriver states that pt was leaning to the right, left gaze-- pt had a full body seizure for EMS , received Midazolam 5mg  IM

## 2019-02-26 NOTE — Code Documentation (Signed)
Intubated with 7.5 ETT , 21 at lips-- per dr Vanita Panda

## 2019-02-26 NOTE — Procedures (Addendum)
Patient Name: Michael [name]  MRN: [id number]  Epilepsy Attending: Lora Havens  Referring Physician/Provider: Dr Kerney Elbe Date: 02/26/2019 Duration: 29.41 mins  Patient history: 64 year old male with history of hypertension who presented with 2 witnessed generalized tonic-clonic seizures in the setting of blood glucose more than 700.  On examination, patient is obtunded and not responding to noxious stimuli.  EEG to evaluate for subclinical seizures/status epilepticus.  Level of alertness: comatose  AEDs during EEG study: Keppra, versed  Technical aspects: This EEG study was done with scalp electrodes positioned according to the 10-20 International system of electrode placement. Electrical activity was acquired at a sampling rate of 500Hz  and reviewed with a high frequency filter of 70Hz  and a low frequency filter of 1Hz . EEG data were recorded continuously and digitally stored.   Description: EEG showed continuous generalized low amplitude 3 to 7 Hz theta-delta slowing admixed with 15 to 18 Hz generalized beta activity.  EEG was reactive to noxious stimulation. Hyperventilation and photic stimulation were not performed due to AMS.  Abnormality -Continuous slow, generalized  IMPRESSION: This study is suggestive of severe diffuse encephalopathy, nonspecific etiology but likely secondary to sedation and postictal state. No seizures or epileptiform discharges were seen throughout the recording.  Dr. Kerney Elbe was notified.    Lanore Renderos Barbra Sarks

## 2019-02-26 NOTE — Progress Notes (Signed)
BELONGINGS: two white belonging bags in closet containing clothing and one [employer] wallet in a biohazard bag. I did not open the wallet.

## 2019-02-26 NOTE — ED Provider Notes (Signed)
[facility] EMERGENCY DEPARTMENT Provider Note   CSN: [id number] Arrival date & time: 02/26/19  1340     History No chief complaint on file.   ZEKIEL TORIAN is a 64 y.o. male.  HPI    Patient presents as a code stroke. Patient is essentially noninteractive, level 5 caveat. Per report patient was seen by other new visuals wandering outside of his car, acting in an unusual manner. Subsequently the patient reportedly had a seizure, and now on arrival is essentially noninteractive. EMS reports he was hemodynamically unremarkable, but as above, had little interactivity.  Past Medical History:  Diagnosis Date  . Anxiety   . Arthritis    "joints; shoulders; feet" (06/08/2016)  . Bipolar disorder ([place])   . Daily headache    "7:30 - 8:00 q night" (06/08/2016)  . Diabetic peripheral neuropathy ([place])   . GERD (gastroesophageal reflux disease)   . History of hepatitis B virus infection conferring immunity 03/22/2018  . Pancreatitis   . Schizophrenia ([place])   . Seizures ([place]) 01/2016 X 2  . Type II diabetes mellitus [facility])     Patient Active Problem List   Diagnosis Date Noted  . History of hepatitis B virus infection conferring immunity 03/22/2018  . Alcohol use disorder, severe, dependence ([place]) 12/29/2017  . Malnutrition of moderate degree 03/30/2017  . Hypoglycemia 03/26/2017  . Acute metabolic encephalopathy 50/27/7412  . Other constipation 10/16/2016  . Recurrent falls 07/07/2016  . Wernicke's encephalopathy   . Altered mental status 06/09/2016  . Bipolar disorder ([place]) 06/01/2016  . Dry skin 01/18/2016  . Peripheral vascular disease ([place]) 11/15/2015  . Hyperglycemia 09/02/2015  . Tobacco abuse 09/02/2015  . Tricompartment osteoarthritis of both knees 06/15/2015  . Vitamin D deficiency 03/12/2015  . Hyperlipidemia 02/17/2015  . Protein-calorie malnutrition, severe 11/18/2014  . Alcohol use   . Cervical arthritis 01/01/2014  . Health care maintenance  08/25/2011  . Chronic alcoholic pancreatitis ([place]) 04/20/2011  . Type 2 diabetes mellitus with peripheral neuropathy ([place]) 12/09/2010  . HTN (hypertension) 12/09/2010    Past Surgical History:  Procedure Laterality Date  . BALLOON DILATION  03/22/2011   Procedure: BALLOON DILATION;  Surgeon: Gatha Mayer, MD;  Location: [facility] END[facility];  Service: Endoscopy;  Laterality: N/A;  . COLONOSCOPY N/A 05/22/2012   Procedure: COLONOSCOPY;  Surgeon: Gatha Mayer, MD;  Location: [place];  Service: Endoscopy;  Laterality: N/A;  . COLONOSCOPY W/ BIOPSIES AND POLYPECTOMY  09/12/11  . ESOPHAGOGASTRODUODENOSCOPY  03/22/2011   Procedure: ESOPHAGOGASTRODUODENOSCOPY (EGD);  Surgeon: Gatha Mayer, MD;  Location: [facility];  Service: Endoscopy;  Laterality: N/A;  egd with balloon   . EUS  04/20/2011   Procedure: UPPER ENDOSCOPIC ULTRASOUND (EUS) LINEAR;  Surgeon: Milus Banister, MD;  Location: [facility];  Service: Endoscopy;  Laterality: N/A;  . KNEE ARTHROSCOPY Left        Family History  Problem Relation Age of Onset  . Hypertension Sister   . Diabetes Sister   . Heart disease Sister   . Colon cancer Neg Hx   . Stomach cancer Neg Hx   . Anesthesia problems Neg Hx   . Hypotension Neg Hx   . Pseudochol deficiency Neg Hx   . Malignant hyperthermia Neg Hx     Social History   Tobacco Use  . Smoking status: Current Every Day Smoker    Packs/day: 0.50    Years: 42.00    Pack years: 21.00    Types: Cigarettes  .  Smokeless tobacco: Never Used  . Tobacco comment: Sometimes less.  Substance Use Topics  . Alcohol use: Yes    Alcohol/week: 2.0 standard drinks    Types: 2 Cans of beer per week  . Drug use: Not Currently    Types: Marijuana    Comment: Sober for 2 years    Home Medications Prior to Admission medications   Medication Sig Start Date End Date Taking? Authorizing Provider  ACCU-CHEK SOFTCLIX LANCETS lancets Use 3 times daily to check blood sugar. diag code E11.42.  Insulin dependent 03/30/17   Roxan Hockey, MD  Alcohol Swabs (B-D SINGLE USE SWABS REGULAR) PADS Patient uses 6 times a day. Patient tests 3 times per day and administers insulin 3 times a day. ICD 10 code E11.42 03/30/17   Roxan Hockey, MD  ammonium lactate (LAC-HYDRIN) 12 % cream Apply topically as needed for dry skin. 10/10/17   Gardiner Barefoot, DPM  ARIPiprazole (ABILIFY) 5 MG tablet Take 1 tablet (5 mg total) by mouth daily. 03/30/17   Roxan Hockey, MD  aspirin EC 81 MG tablet Take by mouth.    [provider]  BELBUCA [employer] 1 Film inside cheek every 12 (twelve) hours. 12/25/17   [provider]  Blood Glucose Calibration (ACCU-CHEK AVIVA) SOLN 1 drop by In Vitro route as needed. diag code E08.10 insulin dependent. Use as device directs 03/30/17   Roxan Hockey, MD  Blood Glucose Monitoring Suppl (ACCU-CHEK AVIVA PLUS) w/Device KIT 1 kit by Does not apply route every morning. diag code E11.42. Insulin dependent 03/30/17   Roxan Hockey, MD  diclofenac sodium (VOLTAREN) 1 % GEL Apply 2 g topically 4 (four) times daily -  before meals and at bedtime. 01/05/17   Collier Salina, MD  DULoxetine (CYMBALTA) 60 MG capsule Take 1 capsule (60 mg total) by mouth daily. 03/30/17   Roxan Hockey, MD  feeding supplement, GLUCERNA SHAKE, (GLUCERNA SHAKE) LIQD Take 237 mLs by mouth 3 (three) times daily between meals. 03/30/17   Roxan Hockey, MD  folic acid (FOLVITE) 1 MG tablet Take 1 tablet (1 mg total) by mouth daily. 03/30/17   Roxan Hockey, MD  gabapentin (NEURONTIN) 300 MG capsule TAKE 3 CAPSULES BY MOUTH  EVERY MORNING AND  AFTERNOON. TAKE 4 CAPSULES  BY MOUTH AT NIGHT BEFORE  BED Patient taking differently: Take 900-1,200 mg by mouth 3 (three) times daily. TAKE 3 CAPSULES BY MOUTH  EVERY MORNING AND  AFTERNOON. TAKE 4 CAPSULES  BY MOUTH AT NIGHT BEFORE  BED 01/05/17   Collier Salina, MD  glipiZIDE (GLUCOTROL) 10 MG tablet Take by mouth.    [provider]  glucose blood (ACCU-CHEK AVIVA PLUS) test strip Use 3 times daily to check blood sugar. diag code E11.42. Insulin dependent 03/30/17   Roxan Hockey, MD  hydrOXYzine (ATARAX/VISTARIL) 10 MG tablet Take 10 mg by mouth 3 (three) times daily as needed for anxiety.  09/26/17   [provider]  ibuprofen (ADVIL,MOTRIN) 800 MG tablet Take 800 mg by mouth 3 (three) times daily as needed for mild pain.  09/17/17   [provider]  insulin aspart (NOVOLOG) 100 UNIT/ML FlexPen Take 5 units before breakfast,   before lunch, and 5 units before dinner (5 units before each meal) Patient taking differently: Inject 5-10 Units into the skin 3 (three) times daily with meals. Take 5 units before breakfast and 10 units before lunch and dinner 03/30/17   Roxan Hockey, MD  LEVEMIR FLEXTOUCH 100 UNIT/ML  Pen 4 units Twice a day 03/30/17   Roxan Hockey, MD  lipase/protease/amylase (CREON) 36000 UNITS CPEP capsule TAKE 1 CAPSULE BY MOUTH 3  TIMES DAILY BEFORE MEALS 01/05/17   Rice, Resa Miner, MD  Multiple Vitamin (MULTIVITAMIN WITH MINERALS) TABS tablet Take 1 tablet by mouth daily. 03/30/17   Roxan Hockey, MD  naloxone [facility]) nasal spray 4 mg/0.1 mL as needed. 07/13/17   [provider]  omega-3 acid ethyl esters (LOVAZA) 1 g capsule Take 1 capsule (1 g total) by mouth daily. 10/15/16   Sid Falcon, MD  oxyCODONE-acetaminophen (PERCOCET/ROXICET) 5-325 MG tablet Take 1 tablet by mouth every 12 (twelve) hours as needed for moderate pain.  10/04/17   [provider]  pantoprazole (PROTONIX) 40 MG tablet Take 1 tablet (40 mg total) by mouth daily. 03/30/17   Roxan Hockey, MD  pravastatin (PRAVACHOL) 10 MG tablet Take 1 tablet (10 mg total) by mouth daily at 6 PM. 01/05/17   Rice, Resa Miner, MD  QUEtiapine Fumarate (SEROQUEL XR) 150 MG 24 hr tablet Take by mouth. 07/20/17   [provider]  thiamine (VITAMIN B-1) 100 MG tablet Take 1 tablet (100 mg total)  by mouth daily. 03/30/17   Roxan Hockey, MD    Allergies    Ace inhibitors, Losartan, and Tylenol [acetaminophen]  Review of Systems   Review of Systems  Unable to perform ROS: Acuity of condition    Physical Exam Updated Vital Signs Wt 47.9 kg   BMI 18.13 kg/m   Physical Exam Vitals and nursing note reviewed.  Constitutional:      General: He is not in acute distress.    Appearance: He is well-developed.     Comments: Noninteractive thin adult male  HENT:     Head: Normocephalic and atraumatic.  Eyes:     Conjunctiva/sclera: Conjunctivae normal.  Cardiovascular:     Rate and Rhythm: Normal rate and regular rhythm.  Pulmonary:     Effort: Pulmonary effort is normal. No respiratory distress.     Breath sounds: No stridor.  Abdominal:     General: There is no distension.  Skin:    General: Skin is warm and dry.  Neurological:     Comments: Patient moves with painful stimuli, but otherwise is in noninteractive state consistent with postictal phase.  Psychiatric:        Cognition and Memory: Cognition is impaired.     ED Results / Procedures / Treatments   Labs (all labs ordered are listed, but only abnormal results are displayed) Labs Reviewed  CBC - Abnormal; Notable for the following components:      Result Value   MCV 101.8 (*)    All other components within normal limits  COMPREHENSIVE METABOLIC PANEL - Abnormal; Notable for the following components:   Sodium 129 (*)    Potassium 2.4 (*)    Chloride 92 (*)    CO2 17 (*)    Glucose, Bld 1,175 (*)    BUN 5 (*)    Calcium 7.8 (*)    Total Protein 5.8 (*)    Albumin 2.9 (*)    AST 75 (*)    ALT 71 (*)    Alkaline Phosphatase 316 (*)    Anion gap 20 (*)    All other components within normal limits  OSMOLALITY - Abnormal; Notable for the following components:   Osmolality 336 (*)    All other components within normal limits  I-STAT CHEM 8, ED - Abnormal; Notable  for the following components:   Sodium  130 (*)    Potassium 2.3 (*)    Chloride 92 (*)    BUN 5 (*)    Creatinine, Ser 0.60 (*)    Glucose, Bld >700 (*)    Calcium, Ion 1.02 (*)    TCO2 18 (*)    All other components within normal limits  CBG MONITORING, ED - Abnormal; Notable for the following components:   Glucose-Capillary >600 (*)    All other components within normal limits  CBG MONITORING, ED - Abnormal; Notable for the following components:   Glucose-Capillary >600 (*)    All other components within normal limits  POCT I-STAT 7, (LYTES, BLD GAS, ICA,H+H) - Abnormal; Notable for the following components:   pH, Arterial 7.299 (*)    pCO2 arterial 54.3 (*)    pO2, Arterial 570.0 (*)    Sodium 134 (*)    Potassium 3.4 (*)    Calcium, Ion 1.10 (*)    All other components within normal limits  RESPIRATORY PANEL BY RT PCR (FLU A&B, COVID)  CULTURE, BLOOD (ROUTINE X 2)  CULTURE, BLOOD (ROUTINE X 2)  URINE CULTURE  PROTIME-INR  APTT  DIFFERENTIAL  RAPID URINE DRUG SCREEN, HOSP PERFORMED  URINALYSIS, ROUTINE W REFLEX MICROSCOPIC  BLOOD GAS, ARTERIAL  HIV ANTIBODY (ROUTINE TESTING W REFLEX)  CBC  BASIC METABOLIC PANEL  BASIC METABOLIC PANEL  BASIC METABOLIC PANEL  BASIC METABOLIC PANEL  LACTIC ACID, PLASMA  LACTIC ACID, PLASMA  LIPASE, BLOOD  PROCALCITONIN  BETA-HYDROXYBUTYRIC ACID  BETA-HYDROXYBUTYRIC ACID    EKG EKG Interpretation  Date/Time:  Wednesday February 26 2019 14:20:20 EST Ventricular Rate:  116 PR Interval:    QRS Duration: 75 QT Interval:  431 QTC Calculation: 599 R Axis:   73 Text Interpretation: Sinus tachycardia Repol abnrm suggests ischemia, diffuse leads Prolonged QT interval Abnormal ECG Confirmed by Carmin Muskrat [phone]) on 02/26/2019 4:01:18 PM   Radiology EEG  Result Date: 02/26/2019 Lora Havens, MD     02/26/2019  3:09 PM Patient Name: DEVONTRE SIEDSCHLAG MRN: [id number] Epilepsy Attending: Lora Havens Referring Physician/Provider: Dr Kerney Elbe Date: 02/26/2019  Duration: 29.41 mins Patient history: 64 year old male with history of hypertension who presented with 2 witnessed generalized tonic-clonic seizures in the setting of blood glucose more than 700.  On examination, patient is obtunded and not responding to noxious stimuli.  EEG to evaluate for subclinical seizures/status epilepticus. Level of alertness: comatose AEDs during EEG study: Keppra, versed Technical aspects: This EEG study was done with scalp electrodes positioned according to the 10-20 International system of electrode placement. Electrical activity was acquired at a sampling rate of _0  and reviewed with a high frequency filter of _1  and a low frequency filter of _2 . EEG data were recorded continuously and digitally stored. Description: EEG showed continuous generalized low amplitude 3 to 7 Hz theta-delta slowing admixed with 15 to 18 Hz generalized beta activity.  EEG was reactive to noxious stimulation. Hyperventilation and photic stimulation were not performed due to AMS. Abnormality -Continuous slow, generalized IMPRESSION: This study is suggestive of severe diffuse encephalopathy, nonspecific etiology but likely secondary to sedation and postictal state. No seizures or epileptiform discharges were seen throughout the recording. Dr. Kerney Elbe was notified. Lora Havens   CT Code Stroke CTA Head W/WO contrast  Result Date: 02/26/2019 CLINICAL DATA:  64 year old male code stroke presentation. Last seen normal 1300 hours. EXAM: CT ANGIOGRAPHY HEAD AND NECK TECHNIQUE: Multidetector CT imaging of  the head and neck was performed using the standard protocol during bolus administration of intravenous contrast. Multiplanar CT image reconstructions and MIPs were obtained to evaluate the vascular anatomy. Carotid stenosis measurements (when applicable) are obtained utilizing NASCET criteria, using the distal internal carotid diameter as the denominator. CONTRAST:  75 milliliters Omnipaque 350.  COMPARISON:  Head CT 1352 hours today. CTA head and neck 03/26/2017. FINDINGS: CTA NECK Skeleton: Absent dentition. No acute osseous abnormality identified. Upper chest: Fluid-filled thoracic esophagus but otherwise negative visible upper chest. Other neck: Mild motion artifact. There is a right nasal airway in place. No neck mass or lymphadenopathy identified. Aortic arch: 4 vessel arch configuration, the left vertebral artery arises directly from the arch. Mild arch atherosclerosis. Right carotid system: Minor brachiocephalic calcified plaque. Tortuous proximal right CCA. Chronic calcified plaque at the level of the right thyroid but no significant stenosis. Negative right carotid bifurcation. Mild calcified plaque just below the skull base but no cervical right ICA stenosis. Left carotid system: Mild left CCA origin plaque without stenosis. Stable mild calcified plaque at the left ICA origin and bulb without stenosis. Patent left ICA to the skull base. Vertebral arteries: Normal proximal right subclavian artery and right vertebral artery origin. The right vertebral is non dominant and diminutive but remains patent to the skull base without stenosis. The dominant left vertebral artery arises directly from the arch with mild calcification at its origin but no stenosis. The left vertebral remains patent to the skull base without stenosis. CTA HEAD Posterior circulation: Dominant left vertebral artery, the right functionally terminates in PICA. Mild left V4 segment plaque without stenosis. Normal left PICA origin. Patent basilar artery without stenosis. Fetal type PCA origins more so the left. SCA origins are patent. Bilateral PCA branches are stable and within normal limits allowing for mild motion artifact. Anterior circulation: Both ICA siphons are patent with chronic calcified plaque not resulting in significant stenosis. Normal an normal MCA and ACA origins. Posterior communicating artery origins are within  normal limits. Diminutive or absent anterior communicating artery. Bilateral ACA branches are stable and within normal limits. Left MCA M1 segment bifurcates early without stenosis. Left MCA branches are stable and within normal limits. Right MCA M1 segment and bifurcation are patent without stenosis. Right MCA branches are stable and within normal limits. Venous sinuses: Patent. Dominant right transverse and sigmoid sinuses. Anatomic variants: Left vertebral artery is dominant and arises directly from the arch. Fetal type PCA origins, more so the left. Dominant right transverse and sigmoid sinuses. Review of the MIP images confirms the above findings IMPRESSION: 1. Negative for large vessel occlusion. 2. Stable arterial findings on CTA since 2019. Carotid atherosclerosis but no hemodynamically significant stenosis. 3. Fluid-filled thoracic esophagus noted but of unclear significance. Right nasal airway in place. Findings discussed by telephone and text page with Dr. Kerney Elbe on 02/26/2019 at 14:16 . Electronically Signed   By: Genevie Ann M.D.   On: 02/26/2019 14:19   CT Code Stroke CTA Neck W/WO contrast  Result Date: 02/26/2019 CLINICAL DATA:  64 year old male code stroke presentation. Last seen normal 1300 hours. EXAM: CT ANGIOGRAPHY HEAD AND NECK TECHNIQUE: Multidetector CT imaging of the head and neck was performed using the standard protocol during bolus administration of intravenous contrast. Multiplanar CT image reconstructions and MIPs were obtained to evaluate the vascular anatomy. Carotid stenosis measurements (when applicable) are obtained utilizing NASCET criteria, using the distal internal carotid diameter as the denominator. CONTRAST:  75 milliliters Omnipaque 350. COMPARISON:  Head CT 1352 hours today. CTA head and neck 03/26/2017. FINDINGS: CTA NECK Skeleton: Absent dentition. No acute osseous abnormality identified. Upper chest: Fluid-filled thoracic esophagus but otherwise negative visible upper  chest. Other neck: Mild motion artifact. There is a right nasal airway in place. No neck mass or lymphadenopathy identified. Aortic arch: 4 vessel arch configuration, the left vertebral artery arises directly from the arch. Mild arch atherosclerosis. Right carotid system: Minor brachiocephalic calcified plaque. Tortuous proximal right CCA. Chronic calcified plaque at the level of the right thyroid but no significant stenosis. Negative right carotid bifurcation. Mild calcified plaque just below the skull base but no cervical right ICA stenosis. Left carotid system: Mild left CCA origin plaque without stenosis. Stable mild calcified plaque at the left ICA origin and bulb without stenosis. Patent left ICA to the skull base. Vertebral arteries: Normal proximal right subclavian artery and right vertebral artery origin. The right vertebral is non dominant and diminutive but remains patent to the skull base without stenosis. The dominant left vertebral artery arises directly from the arch with mild calcification at its origin but no stenosis. The left vertebral remains patent to the skull base without stenosis. CTA HEAD Posterior circulation: Dominant left vertebral artery, the right functionally terminates in PICA. Mild left V4 segment plaque without stenosis. Normal left PICA origin. Patent basilar artery without stenosis. Fetal type PCA origins more so the left. SCA origins are patent. Bilateral PCA branches are stable and within normal limits allowing for mild motion artifact. Anterior circulation: Both ICA siphons are patent with chronic calcified plaque not resulting in significant stenosis. Normal an normal MCA and ACA origins. Posterior communicating artery origins are within normal limits. Diminutive or absent anterior communicating artery. Bilateral ACA branches are stable and within normal limits. Left MCA M1 segment bifurcates early without stenosis. Left MCA branches are stable and within normal limits. Right  MCA M1 segment and bifurcation are patent without stenosis. Right MCA branches are stable and within normal limits. Venous sinuses: Patent. Dominant right transverse and sigmoid sinuses. Anatomic variants: Left vertebral artery is dominant and arises directly from the arch. Fetal type PCA origins, more so the left. Dominant right transverse and sigmoid sinuses. Review of the MIP images confirms the above findings IMPRESSION: 1. Negative for large vessel occlusion. 2. Stable arterial findings on CTA since 2019. Carotid atherosclerosis but no hemodynamically significant stenosis. 3. Fluid-filled thoracic esophagus noted but of unclear significance. Right nasal airway in place. Findings discussed by telephone and text page with Dr. Kerney Elbe on 02/26/2019 at 14:16 . Electronically Signed   By: Genevie Ann M.D.   On: 02/26/2019 14:19   DG Chest Port 1 View  Result Date: 02/26/2019 CLINICAL DATA:  Endotracheal tube placement EXAM: PORTABLE CHEST 1 VIEW COMPARISON:  02/02/2018 FINDINGS: Endotracheal tube is approximately 3 cm above the carina. Enteric tube terminates within the stomach. No new consolidation or edema. No pleural effusion or pneumothorax. Stable cardiomediastinal contours with normal heart size. IMPRESSION: No acute process in the chest. Electronically Signed   By: Macy Mis M.D.   On: 02/26/2019 14:38   CT HEAD CODE STROKE WO CONTRAST  Result Date: 02/26/2019 CLINICAL DATA:  Code stroke. 64 year old male with left gaze and abnormal speech. EXAM: CT HEAD WITHOUT CONTRAST TECHNIQUE: Contiguous axial images were obtained from the base of the skull through the vertex without intravenous contrast. COMPARISON:  Brain MRI and head CT 03/26/2017. FINDINGS: Brain: Mild motion artifact. No midline shift, mass effect, or evidence of  intracranial mass lesion. No ventriculomegaly. No acute intracranial hemorrhage identified. Patchy chronic white matter hypodensity in both hemispheres. No superimposed acute  cortically based infarct identified. Vascular: Calcified atherosclerosis at the skull base. Questionable abnormal hyperdensity at the left ICA terminus on series 5, image 27, but occurs in an area of streak artifact. Skull: No acute osseous abnormality identified. Sinuses/Orbits: Chronic maxillary sinus periosteal thickening with new left maxillary fluid level since 2019. Other Visualized paranasal sinuses and mastoids are stable and well pneumatized. Other: No acute orbit or scalp soft tissue finding. ASPECTS [facility] Stroke Program Early CT Score) Total score (0-10 with 10 being normal): 10 IMPRESSION: 1. Questionable hyperdensity at the left ICA terminus, but no acute cortically based infarct or acute intracranial hemorrhage identified. ASPECTS is 10. 2. Stable cerebral white matter disease since 2019. 3. These results were communicated to Dr. Cheral Marker at 2:00 pm on 02/26/2019 by text page via the [facility] messaging system. Electronically Signed   By: Genevie Ann M.D.   On: 02/26/2019 14:01    Procedures Procedure Name: Intubation Date/Time: 02/26/2019 12:35 PM Performed by: Carmin Muskrat, MD Pre-anesthesia Checklist: Patient identified, Patient being monitored, Emergency Drugs available, Timeout performed and Suction available Oxygen Delivery Method: Ambu bag Preoxygenation: Pre-oxygenation with 100% oxygen Induction Type: Rapid sequence Ventilation: Mask ventilation without difficulty Laryngoscope Size: Glidescope and 3 Grade View: Grade II Tube type: Subglottic suction tube Tube size: 7.5 mm Number of attempts: 1 Airway Equipment and Method: Video-laryngoscopy Placement Confirmation: ETT inserted through vocal cords under direct vision,  CO2 detector and Breath sounds checked- equal and bilateral Secured at: 21 cm Tube secured with: ETT holder Dental Injury: Teeth and Oropharynx as per pre-operative assessment     .Critical Care Performed by: Carmin Muskrat, MD Authorized by: Carmin Muskrat, MD   Critical care provider statement:    Critical care time (minutes):  40   Critical care start time:  02/26/2019 1:45 PM   Critical care end time:  02/26/2019 3:20 PM   Critical care time was exclusive of:  Separately billable procedures and treating other patients   Critical care was necessary to treat or prevent imminent or life-threatening deterioration of the following conditions:  Circulatory failure, CNS failure or compromise, dehydration, metabolic crisis and cardiac failure   Critical care was time spent personally by me on the following activities:  Discussions with consultants, development of treatment plan with patient or surrogate, ordering and performing treatments and interventions, ordering and review of laboratory studies, ordering and review of radiographic studies, re-evaluation of patient's condition, examination of patient and evaluation of patient's response to treatment   I assumed direction of critical care for this patient from another provider in my specialty: no     (including critical care time)  Medications Ordered in ED Medications  sodium chloride flush (NS) 0.9 % injection 3 mL (has no administration in time range)  levETIRAcetam (KEPPRA) 2,000 mg in sodium chloride 0.9 % 250 mL IVPB (has no administration in time range)  levETIRAcetam (KEPPRA) IVPB 500 mg/100 mL premix (has no administration in time range)    ED Course  I have reviewed the triage vital signs and the nursing notes.  Pertinent labs & imaging results that were available during my care of the patient were reviewed by me and considered in my medical decision making (see chart for details).   Patient emergently taken to the CT machine following arrival to the ED. With concern for stroke versus seizure, versus other causes for encephalopathy,  labs, x-ray performed as well.  Initial labs notable for hyperglycemia, hypokalemia.  2:15 PM Patient required intubation for concern of airway  protection.  This was performed without complication, please see additional documentation note.  Update:, Remaining labs notable for substantial hyperglycemia, hypokalemia. Patient had received initial potassium repletion, will continue to receive additional as well as IV fluids, pending initiation of insulin.  3:59 PM Case discussed with critical care, neurology.  With concern for hyperosmolar hyperglycemic crisis, seizure, lower suspicion for stroke, patient will be admitted to the critical care team.  This adult male presents after apparent confusion, witnessed seizure, is found to have decreased mental status, substantial electrolyte abnormalities, and concern for airway protection. Patient required substantial resuscitation in the emergency department, and admission to the critical care team for further monitoring, management.  Final Clinical Impression(s) / ED Diagnoses Final diagnoses:  Seizure ([place])  Hyperosmolar hyperglycemic state (HHS) ([place])  Hypokalemia     Carmin Muskrat, MD 02/26/19 1601

## 2019-02-26 NOTE — H&P (Signed)
NAME:  Michael [name], MRN:  [id number], DOB:  [birth year], LOS: 0 ADMISSION DATE:  02/26/2019, CONSULTATION DATE: 2/3 REFERRING MD: Vanita Panda, CHIEF COMPLAINT: Seizure  Brief History   64 year old male patient admitted 2/3 after witnessed seizure.  Postictally unresponsive with GCS 3 on arrival to the emergency room, had vomiting episode while in CT scan.  CT negative for hemorrhage.  Intubated for airway protection.  Pulmonary asked to admit  History of present illness   64 year old male with history as mentioned below apparently had called a cab at about 120 this afternoon.  Once in the cab had seizure-like episode which was witnessed, then was slumped over to his right.  EMS was called, on arrival he had left gaze preference.  While in route to the emergency room had witnessed tonic-clonic seizure lasting for approximately 1 minute.  On arrival to the emergency room GCS 3, capillary blood glucose greater than 600, with sodium 129, breathing spontaneously but unresponsive . While getting CT of brain had vomiting episode.  CT of head is negative for acute hemorrhage Intubated for airway protection when returned to the emergency room  IV hydration started, loaded with IV Keppra, preliminary initial EEG negative for seizure, showing generalized slowing.  Pulmonary asked to admit  Past Medical History  Diabetes, gastroesophageal reflux disease, hepatitis B, pancreatitis, bipolar disease, prior seizure .,  Malignant hypertension.  Significant Hospital Events   2/3 admitted, IV hydration initiated, loaded with IV Keppra, CT brain negative for acute hemorrhage, placed on EEG monitoring  Consults:  Neurology  Procedures:  Intubation 2/3   Significant Diagnostic Tests:  CT brain 2/3: Negative for acute hemorrhage, generalized atrophy EEG 2/3>>> Urine drug screen 2/3>>>  Micro Data:  Blood cultures x2 2/3>>> Urine culture 2/3>>>  Antimicrobials:   Interim history/subjective:  [name]  responsive on vent  Objective   Height '5\' 2"'$  (1.575 m), weight 47.9 kg.    Vent Mode: PRVC FiO2 (%):  [100 %] 100 % Set Rate:  [16 bmp] 16 bmp Vt Set:  [440 mL] 440 mL PEEP:  [5 cmH20] 5 cmH20 Plateau Pressure:  [20 cmH20] 20 cmH20  No intake or output data in the 24 hours ending 02/26/19 1506 Filed Weights   02/26/19 1300  Weight: 47.9 kg    Examination: General: Frail 64 year old black male currently minimally responsive on full ventilatory support HENT: Pupils pinpoint but reactive orally intubated mucous membranes moist Lungs: Diffuse rales equal chest rise Cardiovascular: Tachycardic regular rate and rhythm Abdomen: Soft not tender positive bowel sounds Extremities: Warm and dry with brisk capillary refill palpable pulses Neuro: GCS 3 currently GU: Incontinent of urine  Resolved Hospital Problem list     Assessment & Plan:   Acute metabolic encephalopathy, status post witnessed seizure -Has seizure history -Severely hyperglycemic, this may have contributed to breakthrough seizure, in addition to other metabolic abnormalities.  Currently seems to be postictal Plan Seizure precautions EEG per neurology Keppra being loaded, continue AEDs as advised by neuro service  History of bipolar disease -On multiple medications at home including Abilify, Cymbalta, Neurontin,Seroquel, and as needed Vistaril Plan Cymbalta, Neurontin, Seroquel and Abilify as able Monitoring QTC  Acute respiratory failure, with possible aspiration event.  Intubated for ineffective cough and airway protection Portable chest x-ray Endotracheal tubes in satisfactory position no acute process currently Plan Continue full ventilator support Follow-up ABG In a.m. chest x-ray VAP bundle Trend fever and white blood cell curve, holding off on antibiotics for now  anion gap metabolic acidosis,  favor acidosis likely postictal lactic acidosis, and hyperglycemia representing hyperosmolar hyperglycemic  state over DKA Plan IV hydration Follow-up lactic acid F/u abg  Severe hyperglycemia w/ known h/o DMT2 Favor HHNK over DKA  Plan Check beta hydroxybutyric acid Insulin gtt Serial chemistries  Ck Hgb A1C Ck lipase  Fluid and electrolyte imbalance: Severe hypokalemia, pseudohyponatremia, sodium corrects to 146 with glucose Plan Potassium replacement IV hydration   Macrocytosis Plan Trend CBC Checking thiamine and folate  Best practice:  Diet: NPO Pain/Anxiety/Delirium protocol (if indicated): 2/3 VAP protocol (if indicated): 2/3 DVT prophylaxis: [place] heparin  GI prophylaxis: PPI Glucose control: DKA protocol  Mobility: BR Code Status: full code  Family Communication: pending  Disposition:  To ICU  Labs   CBC: Recent Labs  Lab 02/26/19 1344 02/26/19 1350  WBC 8.1  --   NEUTROABS 4.7  --   HGB 14.2 15.3  HCT 44.5 45.0  MCV 101.8*  --   PLT 193  --     Basic Metabolic Panel: Recent Labs  Lab 02/26/19 1344 02/26/19 1350  NA 129* 130*  K 2.4* 2.3*  CL 92* 92*  CO2 17*  --   GLUCOSE PENDING >700*  BUN 5* 5*  CREATININE 1.01 0.60*  CALCIUM 7.8*  --    GFR: Estimated Creatinine Clearance: 65.7 mL/min (A) (by C-G formula based on SCr of 0.6 mg/dL (L)). Recent Labs  Lab 02/26/19 1344  WBC 8.1    Liver Function Tests: Recent Labs  Lab 02/26/19 1344  AST 75*  ALT 71*  ALKPHOS 316*  BILITOT 0.9  PROT 5.8*  ALBUMIN 2.9*   No results for input(s): LIPASE, AMYLASE in the last 168 hours. No results for input(s): AMMONIA in the last 168 hours.  ABG    Component Value Date/Time   PHART 7.389 10/12/2014 0725   PCO2ART 47.3 (H) 10/12/2014 0725   PO2ART 178.0 (H) 10/12/2014 0725   HCO3 26.7 (H) 09/02/2015 1842   TCO2 18 (L) 02/26/2019 1350   ACIDBASEDEF 1.0 09/02/2015 1842   O2SAT 58.0 09/02/2015 1842     Coagulation Profile: Recent Labs  Lab 02/26/19 1344  INR 1.2    Cardiac Enzymes: No results for input(s): CKTOTAL, CKMB, CKMBINDEX,  TROPONINI in the last 168 hours.  HbA1C: Hemoglobin A1C  Date/Time Value Ref Range Status  10/13/2016 01:50 PM 11.6  Final  07/21/2016 02:45 PM 9.1  Final   Hgb A1c MFr Bld  Date/Time Value Ref Range Status  03/27/2017 03:17 AM 11.1 (H) 4.8 - 5.6 % Final    Comment:    (NOTE) Pre diabetes:          5.7%-6.4% Diabetes:              >6.4% Glycemic control for   <7.0% adults with diabetes   12/04/2014 09:45 AM 13.8 (H) 4.8 - 5.6 % Final    Comment:    (NOTE)         Pre-diabetes: 5.7 - 6.4         Diabetes: >6.4         Glycemic control for adults with diabetes: <7.0     CBG: Recent Labs  Lab 02/26/19 1343  GLUCAP >600*    Review of Systems:   Not able   Past Medical History  He,  has a past medical history of Anxiety, Arthritis, Bipolar disorder ([place]), Daily headache, Diabetic peripheral neuropathy ([place]), GERD (gastroesophageal reflux disease), History of hepatitis B virus infection conferring immunity (03/22/2018), Pancreatitis, Schizophrenia ([place]),  Seizures ([place]) (01/2016 X 2), and Type II diabetes mellitus ([place]).   Surgical History    Past Surgical History:  Procedure Laterality Date  . BALLOON DILATION  03/22/2011   Procedure: BALLOON DILATION;  Surgeon: Gatha Mayer, MD;  Location: [facility];  Service: Endoscopy;  Laterality: N/A;  . COLONOSCOPY N/A 05/22/2012   Procedure: COLONOSCOPY;  Surgeon: Gatha Mayer, MD;  Location: [facility];  Service: Endoscopy;  Laterality: N/A;  . COLONOSCOPY W/ BIOPSIES AND POLYPECTOMY  09/12/11  . ESOPHAGOGASTRODUODENOSCOPY  03/22/2011   Procedure: ESOPHAGOGASTRODUODENOSCOPY (EGD);  Surgeon: Gatha Mayer, MD;  Location: [facility];  Service: Endoscopy;  Laterality: N/A;  egd with balloon   . EUS  04/20/2011   Procedure: UPPER ENDOSCOPIC ULTRASOUND (EUS) LINEAR;  Surgeon: Milus Banister, MD;  Location: [facility];  Service: Endoscopy;  Laterality: N/A;  . KNEE ARTHROSCOPY Left      Social History   reports that he  has been smoking cigarettes. He has a 21.00 pack-year smoking history. He has never used smokeless tobacco. He reports current alcohol use of about 2.0 standard drinks of alcohol per week. He reports previous drug use. Drug: Marijuana.   Family History   His family history includes Diabetes in his sister; Heart disease in his sister; Hypertension in his sister. There is no history of Colon cancer, Stomach cancer, Anesthesia problems, Hypotension, Pseudochol deficiency, or Malignant hyperthermia.   Allergies Allergies  Allergen Reactions  . Ace Inhibitors Swelling and Other (See Comments)    Angioedema - unquestionable  . Losartan Swelling and Other (See Comments)    Pt presented with unquestionable angioedema on ACEIs (Angiotensin-converting enzyme (ACE) inhibitors) so aviod ARBs (Angiotensin receptor blockers), as well  . Tylenol [Acetaminophen] Other (See Comments)    "cannot have this" (contraindicated)     Home Medications  Prior to Admission medications   Medication Sig Start Date End Date Taking? Authorizing Provider  ACCU-CHEK SOFTCLIX LANCETS lancets Use 3 times daily to check blood sugar. diag code E11.42. Insulin dependent 03/30/17   Roxan Hockey, MD  Alcohol Swabs (B-D SINGLE USE SWABS REGULAR) PADS Patient uses 6 times a day. Patient tests 3 times per day and administers insulin 3 times a day. ICD 10 code E11.42 03/30/17   Roxan Hockey, MD  ammonium lactate (LAC-HYDRIN) 12 % cream Apply topically as needed for dry skin. 10/10/17   Gardiner Barefoot, DPM  ARIPiprazole (ABILIFY) 5 MG tablet Take 1 tablet (5 mg total) by mouth daily. 03/30/17   Roxan Hockey, MD  aspirin EC 81 MG tablet Take by mouth.    [provider]  BELBUCA [place] 1 Film inside cheek every 12 (twelve) hours. 12/25/17   [provider]  Blood Glucose Calibration (ACCU-CHEK AVIVA) SOLN 1 drop by In Vitro route as needed. diag code E08.10 insulin dependent. Use as device directs  03/30/17   Roxan Hockey, MD  Blood Glucose Monitoring Suppl (ACCU-CHEK AVIVA PLUS) w/Device KIT 1 kit by Does not apply route every morning. diag code E11.42. Insulin dependent 03/30/17   Roxan Hockey, MD  diclofenac sodium (VOLTAREN) 1 % GEL Apply 2 g topically 4 (four) times daily -  before meals and at bedtime. 01/05/17   Collier Salina, MD  DULoxetine (CYMBALTA) 60 MG capsule Take 1 capsule (60 mg total) by mouth daily. 03/30/17   Roxan Hockey, MD  feeding supplement, GLUCERNA SHAKE, (GLUCERNA SHAKE) LIQD Take 237 mLs by mouth 3 (three) times daily between meals. 03/30/17  Roxan Hockey, MD  folic acid (FOLVITE) 1 MG tablet Take 1 tablet (1 mg total) by mouth daily. 03/30/17   Roxan Hockey, MD  gabapentin (NEURONTIN) 300 MG capsule TAKE 3 CAPSULES BY MOUTH  EVERY MORNING AND  AFTERNOON. TAKE 4 CAPSULES  BY MOUTH AT NIGHT BEFORE  BED Patient taking differently: Take 900-1,200 mg by mouth 3 (three) times daily. TAKE 3 CAPSULES BY MOUTH  EVERY MORNING AND  AFTERNOON. TAKE 4 CAPSULES  BY MOUTH AT NIGHT BEFORE  BED 01/05/17   Collier Salina, MD  glipiZIDE (GLUCOTROL) 10 MG tablet Take by mouth.    [provider]  glucose blood (ACCU-CHEK AVIVA PLUS) test strip Use 3 times daily to check blood sugar. diag code E11.42. Insulin dependent 03/30/17   Roxan Hockey, MD  hydrOXYzine (ATARAX/VISTARIL) 10 MG tablet Take 10 mg by mouth 3 (three) times daily as needed for anxiety.  09/26/17   [provider]  ibuprofen (ADVIL,MOTRIN) 800 MG tablet Take 800 mg by mouth 3 (three) times daily as needed for mild pain.  09/17/17   [provider]  insulin aspart (NOVOLOG) 100 UNIT/ML FlexPen Take 5 units before breakfast,   before lunch, and 5 units before dinner (5 units before each meal) Patient taking differently: Inject 5-10 Units into the skin 3 (three) times daily with meals. Take 5 units before breakfast and 10 units before lunch and dinner 03/30/17   Roxan Hockey,  MD  LEVEMIR FLEXTOUCH 100 UNIT/ML Pen 4 units Twice a day 03/30/17   Roxan Hockey, MD  lipase/protease/amylase (CREON) 36000 UNITS CPEP capsule TAKE 1 CAPSULE BY MOUTH 3  TIMES DAILY BEFORE MEALS 01/05/17   Rice, Resa Miner, MD  Multiple Vitamin (MULTIVITAMIN WITH MINERALS) TABS tablet Take 1 tablet by mouth daily. 03/30/17   Roxan Hockey, MD  naloxone [facility]) nasal spray 4 mg/0.1 mL as needed. 07/13/17   [provider]  omega-3 acid ethyl esters (LOVAZA) 1 g capsule Take 1 capsule (1 g total) by mouth daily. 10/15/16   Sid Falcon, MD  oxyCODONE-acetaminophen (PERCOCET/ROXICET) 5-325 MG tablet Take 1 tablet by mouth every 12 (twelve) hours as needed for moderate pain.  10/04/17   [provider]  pantoprazole (PROTONIX) 40 MG tablet Take 1 tablet (40 mg total) by mouth daily. 03/30/17   Roxan Hockey, MD  pravastatin (PRAVACHOL) 10 MG tablet Take 1 tablet (10 mg total) by mouth daily at 6 PM. 01/05/17   Rice, Resa Miner, MD  QUEtiapine Fumarate (SEROQUEL XR) 150 MG 24 hr tablet Take by mouth. 07/20/17   [provider]  thiamine (VITAMIN B-1) 100 MG tablet Take 1 tablet (100 mg total) by mouth daily. 03/30/17   Roxan Hockey, MD     Critical care time:  40 minutes.      Erick Colace ACNP-BC [place] Pager # [phone] OR # [phone] if no answer

## 2019-02-27 ENCOUNTER — Inpatient Hospital Stay (HOSPITAL_COMMUNITY): Payer: Medicare (Managed Care)

## 2019-02-27 DIAGNOSIS — J988 Other specified respiratory disorders: Secondary | ICD-10-CM

## 2019-02-27 LAB — BETA-HYDROXYBUTYRIC ACID
Beta-Hydroxybutyric Acid: 0.09 mmol/L (ref 0.05–0.27)
Beta-Hydroxybutyric Acid: 0.06 mmol/L (ref 0.05–0.27)

## 2019-02-27 LAB — PHOSPHORUS: Phosphorus: 1.7 mg/dL — ABNORMAL LOW (ref 2.5–4.6)

## 2019-02-27 LAB — BASIC METABOLIC PANEL
Anion gap: 13 (ref 5–15)
Anion gap: 13 (ref 5–15)
Anion gap: 11 (ref 5–15)
Anion gap: 12 (ref 5–15)
BUN: 5 mg/dL — ABNORMAL LOW (ref 8–23)
BUN: <5 mg/dL — ABNORMAL LOW (ref 8–23)
BUN: <5 mg/dL — ABNORMAL LOW (ref 8–23)
BUN: 5 mg/dL — ABNORMAL LOW (ref 8–23)
CO2: 18 mmol/L — ABNORMAL LOW (ref 22–32)
CO2: 23 mmol/L (ref 22–32)
CO2: 22 mmol/L (ref 22–32)
CO2: 23 mmol/L (ref 22–32)
Calcium: 8.2 mg/dL — ABNORMAL LOW (ref 8.9–10.3)
Calcium: 8.2 mg/dL — ABNORMAL LOW (ref 8.9–10.3)
Calcium: 7 mg/dL — ABNORMAL LOW (ref 8.9–10.3)
Calcium: 8.1 mg/dL — ABNORMAL LOW (ref 8.9–10.3)
Chloride: 114 mmol/L — ABNORMAL HIGH (ref 98–111)
Chloride: 110 mmol/L (ref 98–111)
Chloride: 107 mmol/L (ref 98–111)
Chloride: 110 mmol/L (ref 98–111)
Creatinine, Ser: 0.67 mg/dL (ref 0.61–1.24)
Creatinine, Ser: 0.64 mg/dL (ref 0.61–1.24)
Creatinine, Ser: 0.7 mg/dL (ref 0.61–1.24)
Creatinine, Ser: 0.68 mg/dL (ref 0.61–1.24)
GFR calc Af Amer: >60 mL/min (ref >60)
GFR calc Af Amer: >60 mL/min (ref >60)
GFR calc Af Amer: >60 mL/min (ref >60)
GFR calc Af Amer: >60 mL/min (ref >60)
GFR calc non Af Amer: >60 mL/min (ref >60)
GFR calc non Af Amer: >60 mL/min (ref >60)
GFR calc non Af Amer: >60 mL/min (ref >60)
GFR calc non Af Amer: >60 mL/min (ref >60)
Glucose, Bld: 108 mg/dL — ABNORMAL HIGH (ref 70–99)
Glucose, Bld: 108 mg/dL — ABNORMAL HIGH (ref 70–99)
Glucose, Bld: 841 mg/dL (ref 70–99)
Glucose, Bld: 130 mg/dL — ABNORMAL HIGH (ref 70–99)
Potassium: 3.9 mmol/L (ref 3.5–5.1)
Potassium: 4.1 mmol/L (ref 3.5–5.1)
Potassium: 3.2 mmol/L — ABNORMAL LOW (ref 3.5–5.1)
Potassium: 3.3 mmol/L — ABNORMAL LOW (ref 3.5–5.1)
Sodium: 145 mmol/L (ref 135–145)
Sodium: 146 mmol/L — ABNORMAL HIGH (ref 135–145)
Sodium: 140 mmol/L (ref 135–145)
Sodium: 145 mmol/L (ref 135–145)

## 2019-02-27 LAB — GLUCOSE, CAPILLARY
Glucose-Capillary: 107 mg/dL — ABNORMAL HIGH (ref 70–99)
Glucose-Capillary: 140 mg/dL — ABNORMAL HIGH (ref 70–99)
Glucose-Capillary: 98 mg/dL (ref 70–99)
Glucose-Capillary: 99 mg/dL (ref 70–99)
Glucose-Capillary: 98 mg/dL (ref 70–99)
Glucose-Capillary: 101 mg/dL — ABNORMAL HIGH (ref 70–99)
Glucose-Capillary: 112 mg/dL — ABNORMAL HIGH (ref 70–99)
Glucose-Capillary: 117 mg/dL — ABNORMAL HIGH (ref 70–99)
Glucose-Capillary: 151 mg/dL — ABNORMAL HIGH (ref 70–99)
Glucose-Capillary: 214 mg/dL — ABNORMAL HIGH (ref 70–99)

## 2019-02-27 LAB — CBC
HCT: 29.3 % — ABNORMAL LOW (ref 39.0–52.0)
Hemoglobin: 9.9 g/dL — ABNORMAL LOW (ref 13.0–17.0)
MCH: 32.6 pg (ref 26.0–34.0)
MCHC: 33.8 g/dL (ref 30.0–36.0)
MCV: 96.4 fL (ref 80.0–100.0)
Platelets: 94 10*3/uL — ABNORMAL LOW (ref 150–400)
RBC: 3.04 MIL/uL — ABNORMAL LOW (ref 4.22–5.81)
RDW: 14.9 % (ref 11.5–15.5)
WBC: 8.9 10*3/uL (ref 4.0–10.5)
nRBC: 0.2 % (ref 0.0–0.2)

## 2019-02-27 LAB — POCT I-STAT 7, (LYTES, BLD GAS, ICA,H+H)
Acid-Base Excess: 2 mmol/L (ref 0.0–2.0)
Bicarbonate: 25.7 mmol/L (ref 20.0–28.0)
Calcium, Ion: 1.2 mmol/L (ref 1.15–1.40)
HCT: 36 % — ABNORMAL LOW (ref 39.0–52.0)
Hemoglobin: 12.2 g/dL — ABNORMAL LOW (ref 13.0–17.0)
O2 Saturation: 99 %
Patient temperature: 100.4
Potassium: 2.7 mmol/L — CL (ref 3.5–5.1)
Sodium: 144 mmol/L (ref 135–145)
TCO2: 27 mmol/L (ref 22–32)
pCO2 arterial: 39.7 mmHg (ref 32.0–48.0)
pH, Arterial: 7.424 (ref 7.350–7.450)
pO2, Arterial: 155 mmHg — ABNORMAL HIGH (ref 83.0–108.0)

## 2019-02-27 LAB — URINE CULTURE: Culture: NO GROWTH

## 2019-02-27 LAB — MAGNESIUM: Magnesium: 1.6 mg/dL — ABNORMAL LOW (ref 1.7–2.4)

## 2019-02-27 MED ORDER — THIAMINE HCL 100 MG PO TABS: 100.0000 mg | ORAL_TABLET | Freq: Every day | ORAL | Status: DC

## 2019-02-27 MED ORDER — INSULIN DETEMIR 100 UNIT/ML ~~LOC~~ SOLN
5.0000 [IU] | Freq: Two times a day (BID) | SUBCUTANEOUS | Status: DC
  Filled 2019-02-27 (×2): qty 0.05

## 2019-02-27 MED ORDER — INSULIN DETEMIR 100 UNIT/ML ~~LOC~~ SOLN
5.0000 [IU] | Freq: Two times a day (BID) | SUBCUTANEOUS | Status: DC
  Administered 2019-02-27: 5 [IU] via SUBCUTANEOUS
  Filled 2019-02-27 (×2): qty 0.05

## 2019-02-27 MED ORDER — ARIPIPRAZOLE 2 MG PO TABS: 2.0000 mg | ORAL_TABLET | Freq: Every day | ORAL | Status: DC

## 2019-02-27 MED ORDER — INSULIN ASPART 100 UNIT/ML ~~LOC~~ SOLN: 2.0000 [IU] | SUBCUTANEOUS | Status: DC

## 2019-02-27 MED ORDER — POTASSIUM PHOSPHATES 15 MMOLE/5ML IV SOLN
20.0000 mmol | Freq: Once | INTRAVENOUS | Status: DC
  Filled 2019-02-27: qty 6.67

## 2019-02-27 MED ORDER — ORAL CARE MOUTH RINSE: 15.0000 mL | Freq: Two times a day (BID) | OROMUCOSAL | Status: DC

## 2019-02-27 MED ORDER — GABAPENTIN 300 MG PO CAPS: 900.0000 mg | ORAL_CAPSULE | Freq: Three times a day (TID) | ORAL | Status: DC

## 2019-02-27 MED ORDER — FOLIC ACID 1 MG PO TABS: 1.0000 mg | ORAL_TABLET | Freq: Every day | ORAL | Status: DC

## 2019-02-27 MED ORDER — MAGNESIUM SULFATE 2 GM/50ML IV SOLN
2.0000 g | Freq: Once | INTRAVENOUS | Status: AC
  Administered 2019-02-27: 11:00:00 2 g via INTRAVENOUS
  Filled 2019-02-27: qty 50

## 2019-02-27 NOTE — Procedures (Signed)
Extubation Procedure Note  Patient Details:   Name: Michael [name] DOB: 12-03-1955 MRN: [id number]   Airway Documentation:    Vent end date: 02/27/19 Vent end time: 0909   Evaluation  O2 sats: stable throughout Complications: No apparent complications Patient did tolerate procedure well. Bilateral Breath Sounds: (P) Diminished, rhonchi    Yes   Patient extubated per order to 4L [place] with no apparent complications. Positive cuff leak was noted prior to extubation. Patient is alert and oriented and is able to speak. Vitals are stable. RT will continue to monitor.   Jakylan Ron Clyda Greener 02/27/2019, 9:14 AM

## 2019-02-27 NOTE — Progress Notes (Signed)
NAME:  Michael [name], MRN:  [id number], DOB:  May 13, 1955, LOS: 1 ADMISSION DATE:  02/26/2019, CONSULTATION DATE: 2/3 REFERRING MD: Vanita Panda, CHIEF COMPLAINT: Seizure  Brief History   64 yo male smoker with seizure.  Intubated for airway protection.  Past Medical History  Diabetes, gastroesophageal reflux disease, hepatitis B, pancreatitis, bipolar disease, prior seizure .,  Malignant hypertension.  Significant Hospital Events   2/3 admitted, IV hydration initiated, loaded with IV Keppra, CT brain negative for acute hemorrhage, placed on EEG monitoring  Consults:  Neurology  Procedures:  ETT 2/03 > 2/04  Significant Diagnostic Tests:  CT brain 2/3>>> Negative for acute hemorrhage, generalized atrophy EEG 2/3>>>generalized slowing Urine drug screen 2/3>>>benzo's  Micro Data:  Blood cultures x2 2/3>>> Urine culture 2/3>>>  Antimicrobials:   Interim history/subjective:  Pressure support.  Objective   Blood pressure 103/79, pulse 94, temperature 99.9 F (37.7 C), resp. rate (!) 21, height 5\' 4"  (1.626 m), weight 47.9 kg, SpO2 97 %.    Vent Mode: PRVC FiO2 (%):  [36 %-100 %] 36 % Set Rate:  [16 bmp-20 bmp] 20 bmp Vt Set:  [430 mL-440 mL] 430 mL PEEP:  [5 cmH20] 5 cmH20 Plateau Pressure:  [14 cmH20-20 cmH20] 14 cmH20   Intake/Output Summary (Last 24 hours) at 02/27/2019 0913 Last data filed at 02/27/2019 0800 Gross per 24 hour  Intake 3552.07 ml  Output 1800 ml  Net 1752.07 ml   Filed Weights   02/26/19 1300  Weight: 47.9 kg    Examination:  General - alert Eyes - pupils reactive ENT - ETT in place Cardiac - regular rate/rhythm, no murmur Chest - equal breath sounds b/l, no wheezing or rales Abdomen - soft, non tender, + bowel sounds Extremities - no cyanosis, clubbing, or edema Skin - no rashes Neuro - follows commands  CXR (reviewed by me) - patchy ASD on [facility] Problem list   Anion gap metabolic acidosis, Lactic acidosis  Assessment &  Plan:   Seizure. - AEDs per neurology  Acute hypoxic respiratory failure with compromised airway. - extubate 2/04  Hx of Bipolar disease. - cymbalta, neurontin, seroquel, abilify  Possible aspiration pneumonitis. - if he has fever or productive cough, then add ABx  DM type 2 poorly controlled. - SSI  Hypokalemia. - replace as neede   Best practice:  Diet: NPO DVT prophylaxis:  heparin  GI prophylaxis: PPI Mobility: BR Code Status: full code  Disposition: ICU  Labs    CMP Latest Ref Rng & Units 02/27/2019 02/27/2019 02/27/2019  Glucose 70 - 99 mg/dL 841(HH) 108(H) -  BUN 8 - 23 mg/dL <5(L) <5(L) -  Creatinine 0.61 - 1.24 mg/dL 0.70 0.64 -  Sodium 135 - 145 mmol/L 140 146(H) 144  Potassium 3.5 - 5.1 mmol/L 3.2(L) 4.1 2.7(LL)  Chloride 98 - 111 mmol/L 107 110 -  CO2 22 - 32 mmol/L 22 23 -  Calcium 8.9 - 10.3 mg/dL 7.0(L) 8.2(L) -  Total Protein 6.5 - 8.1 g/dL - - -  Total Bilirubin 0.3 - 1.2 mg/dL - - -  Alkaline Phos 38 - 126 U/L - - -  AST 15 - 41 U/L - - -  ALT 0 - 44 U/L - - -   CBC Latest Ref Rng & Units 02/27/2019 02/26/2019 02/26/2019  WBC 4.0 - 10.5 K/uL - - -  Hemoglobin 13.0 - 17.0 g/dL 12.2(L) 14.3 15.3  Hematocrit 39.0 - 52.0 % 36.0(L) 42.0 45.0  Platelets 150 - 400  K/uL - - -   ABG    Component Value Date/Time   PHART 7.424 02/27/2019 0433   PCO2ART 39.7 02/27/2019 0433   PO2ART 155.0 (H) 02/27/2019 0433   HCO3 25.7 02/27/2019 0433   TCO2 27 02/27/2019 0433   ACIDBASEDEF 1.0 02/26/2019 1528   O2SAT 99.0 02/27/2019 0433   CBG (last 3)  Recent Labs    02/27/19 0616 02/27/19 0742 02/27/19 0849  GLUCAP 101* 112* 117*    CC time 33 minutes  Chesley Mires, MD [place] 02/27/2019, 9:25 AM

## 2019-02-27 NOTE — Discharge Summary (Signed)
Name: JIYAAN HENNINGSON DOB: 1955/02/15 MRN: [id number] Admit date: 02/26/2019 Discharge date: 02/26/2019  History: 64 yo male smoker brought to ER afer having seizure with hyperglycemic.  He required intubation for airway protection.  He was extubated the next day.  He said he had business to take care of at home that only he could do.  He left hospital against medical advice.  Discharge diagnoses: Seizure DM type 2 poorly controlled with hyperglycemia Acute respiratory failure with hypoxia Compromised airway Bipolar disease Hypokalemia  Discharge medications: No medications prescribed as he said he didn't need anything, and he had everything he needed at home  Chesley Mires, MD [place] 02/27/2019, 12:09 PM

## 2019-02-27 NOTE — Progress Notes (Signed)
Subjective: Patient is alert, slightly agitated with the nurse and wants to leave AMA  Objective: Current vital signs: BP (!) 78/43   Pulse (!) 120   Temp 100 F (37.8 C)   Resp (!) 25   Ht 5' 2"  (1.575 m)   Wt 47.9 kg   SpO2 99%   BMI 19.31 kg/m  Vital signs in last 24 hours: Temp:  [94.3 F (34.6 C)-101.8 F (38.8 C)] 100 F (37.8 C) (02/04 0600) Pulse Rate:  [94-120] 120 (02/04 0004) Resp:  [8-25] 25 (02/04 0600) BP: (73-198)/(40-110) 78/43 (02/04 0600) SpO2:  [94 %-100 %] 99 % (02/04 0600) FiO2 (%):  [40 %-100 %] 40 % (02/04 0401) Weight:  [47.9 kg] 47.9 kg (02/03 1300)  Intake/Output from previous day: 02/03 0701 - 02/04 0700 In: 3361.3 [I.V.:2447.8; IV Piggyback:913.5] Out: 1800 [Urine:1800] Intake/Output this shift: No intake/output data recorded. Nutritional status:  Diet Order            Diet NPO time specified  Diet effective now              Neurologic Exam: Mental Status: Alert, believes is 2001, able to follow simple commands such as showing me his thumb, counting my fingers Cranial Nerves: II:  Visual fields grossly normal,  III,IV, VI: ptosis not present, extra-ocular motions intact bilaterally pupils equal, round, reactive to light and accommodation V,VII: Face symmetric  VIII: hearing normal bilaterally Motor: Right : Upper extremity   5/5    Left:     Upper extremity   5/5  Lower extremity   5/5     Lower extremity   5/5   Lab Results: Results for orders placed or performed during the hospital encounter of 02/26/19 (from the past 48 hour(s))  CBG monitoring, ED     Status: Abnormal   Collection Time: 02/26/19  1:43 PM  Result Value Ref Range   Glucose-Capillary >600 (HH) 70 - 99 mg/dL  Protime-INR     Status: None   Collection Time: 02/26/19  1:44 PM  Result Value Ref Range   Prothrombin Time 14.9 11.4 - 15.2 seconds   INR 1.2 0.8 - 1.2    Comment: (NOTE) INR goal varies based on device and disease states. Performed at [facility], [place] [address], [place], [place] [address]   APTT     Status: None   Collection Time: 02/26/19  1:44 PM  Result Value Ref Range   aPTT 29 24 - 36 seconds    Comment: Performed at [facility] [address], [place], [place] [address]  CBC     Status: Abnormal   Collection Time: 02/26/19  1:44 PM  Result Value Ref Range   WBC 8.1 4.0 - 10.5 K/uL   RBC 4.37 4.22 - 5.81 MIL/uL   Hemoglobin 14.2 13.0 - 17.0 g/dL   HCT 44.5 39.0 - 52.0 %   MCV 101.8 (H) 80.0 - 100.0 fL   MCH 32.5 26.0 - 34.0 pg   MCHC 31.9 30.0 - 36.0 g/dL   RDW 15.2 11.5 - 15.5 %   Platelets 193 150 - 400 K/uL   nRBC 0.0 0.0 - 0.2 %    Comment: Performed at [facility], [place] [address] [place], [place] [address]  Differential     Status: None   Collection Time: 02/26/19  1:44 PM  Result Value Ref Range   Neutrophils Relative % 58 %   Neutro Abs 4.7 1.7 -  7.7 K/uL   Lymphocytes Relative 33 %   Lymphs Abs 2.6 0.7 - 4.0 K/uL   Monocytes Relative 7 %   Monocytes Absolute 0.6 0.1 - 1.0 K/uL   Eosinophils Relative 1 %   Eosinophils Absolute 0.1 0.0 - 0.5 K/uL   Basophils Relative 0 %   Basophils Absolute 0.0 0.0 - 0.1 K/uL   Immature Granulocytes 1 %   Abs Immature Granulocytes 0.05 0.00 - 0.07 K/uL    Comment: Performed at [facility] [address] [place], [place] [address]  Comprehensive metabolic panel     Status: Abnormal   Collection Time: 02/26/19  1:44 PM  Result Value Ref Range   Sodium 129 (L) 135 - 145 mmol/L   Potassium 2.4 (LL) 3.5 - 5.1 mmol/L    Comment: CRITICAL RESULT CALLED TO, READ BACK BY AND VERIFIED WITH: COBB,K RN @ 1443 02/26/19 LEONARD,A    Chloride 92 (L) 98 - 111 mmol/L   CO2 17 (L) 22 - 32 mmol/L   Glucose, Bld 1,175 (HH) 70 - 99 mg/dL    Comment: CRITICAL RESULT CALLED TO, READ BACK BY AND VERIFIED WITH: Rolene Arbour RN [phone] M GARRETT    BUN 5 (L) 8 - 23 mg/dL   Creatinine, Ser 1.01 0.61 - 1.24 mg/dL   Calcium 7.8 (L) 8.9 - 10.3 mg/dL    Total Protein 5.8 (L) 6.5 - 8.1 g/dL   Albumin 2.9 (L) 3.5 - 5.0 g/dL   AST 75 (H) 15 - 41 U/L   ALT 71 (H) 0 - 44 U/L   Alkaline Phosphatase 316 (H) 38 - 126 U/L   Total Bilirubin 0.9 0.3 - 1.2 mg/dL   GFR calc non Af Amer >60 >60 mL/min   GFR calc Af Amer >60 >60 mL/min   Anion gap 20 (H) 5 - 15    Comment: Performed at [facility] [address], [place], [place] [address]  Procalcitonin     Status: None   Collection Time: 02/26/19  1:44 PM  Result Value Ref Range   Procalcitonin <0.10 ng/mL    Comment:        Interpretation: PCT (Procalcitonin) <= 0.5 ng/mL: Systemic infection (sepsis) is not likely. Local bacterial infection is possible. (NOTE)       Sepsis PCT Algorithm           Lower Respiratory Tract                                      Infection PCT Algorithm    ----------------------------     ----------------------------         PCT < 0.25 ng/mL                PCT < 0.10 ng/mL         Strongly encourage             Strongly discourage   discontinuation of antibiotics    initiation of antibiotics    ----------------------------     -----------------------------       PCT 0.25 - 0.50 ng/mL            PCT 0.10 - 0.25 ng/mL               OR       >80% decrease in PCT  Discourage initiation of                                            antibiotics      Encourage discontinuation           of antibiotics    ----------------------------     -----------------------------         PCT >= 0.50 ng/mL              PCT 0.26 - 0.50 ng/mL               AND        <80% decrease in PCT             Encourage initiation of                                             antibiotics       Encourage continuation           of antibiotics    ----------------------------     -----------------------------        PCT >= 0.50 ng/mL                  PCT > 0.50 ng/mL               AND         increase in PCT                  Strongly encourage                                       initiation of antibiotics    Strongly encourage escalation           of antibiotics                                     -----------------------------                                           PCT <= 0.25 ng/mL                                                 OR                                        > 80% decrease in PCT                                     Discontinue / Do not initiate  antibiotics Performed at [facility], [place] [address] [place], [place] [address]   I-stat chem 8, ED     Status: Abnormal   Collection Time: 02/26/19  1:50 PM  Result Value Ref Range   Sodium 130 (L) 135 - 145 mmol/L   Potassium 2.3 (LL) 3.5 - 5.1 mmol/L   Chloride 92 (L) 98 - 111 mmol/L   BUN 5 (L) 8 - 23 mg/dL   Creatinine, Ser 0.60 (L) 0.61 - 1.24 mg/dL   Glucose, Bld >700 (HH) 70 - 99 mg/dL   Calcium, Ion 1.02 (L) 1.15 - 1.40 mmol/L   TCO2 18 (L) 22 - 32 mmol/L   Hemoglobin 15.3 13.0 - 17.0 g/dL   HCT 45.0 39.0 - 52.0 %   Comment NOTIFIED PHYSICIAN   Osmolality     Status: Abnormal   Collection Time: 02/26/19  2:37 PM  Result Value Ref Range   Osmolality 336 (HH) 275 - 295 mOsm/kg    Comment: CRITICAL RESULT CALLED TO, READ BACK BY AND VERIFIED WITH: K.COBB,RN [phone] [id number] I.MANNING Performed at [facility], [place] [address], [place], [place] [address]   Respiratory Panel by RT PCR (Flu A&B, Covid) - Nasopharyngeal Swab     Status: None   Collection Time: 02/26/19  2:55 PM   Specimen: Nasopharyngeal Swab  Result Value Ref Range   SARS Coronavirus 2 by RT PCR NEGATIVE NEGATIVE    Comment: (NOTE) SARS-CoV-2 target nucleic acids are NOT DETECTED. The SARS-CoV-2 RNA is generally detectable in upper respiratoy specimens during the acute phase of infection. The lowest concentration of SARS-CoV-2 viral copies this assay can detect is 131 copies/mL. A negative result does not preclude SARS-Cov-2 infection and should not be used as the  sole basis for treatment or other patient management decisions. A negative result may occur with  improper specimen collection/handling, submission of specimen other than nasopharyngeal swab, presence of viral mutation(s) within the areas targeted by this assay, and inadequate number of viral copies (<131 copies/mL). A negative result must be combined with clinical observations, patient history, and epidemiological information. The expected result is Negative. Fact Sheet for Patients:  PinkCheek.be Fact Sheet for Healthcare Providers:  GravelBags.it This test is not yet ap proved or cleared by the [place] FDA and  has been authorized for detection and/or diagnosis of SARS-CoV-2 by FDA under an Emergency Use Authorization (EUA). This EUA will remain  in effect (meaning this test can be used) for the duration of the COVID-19 declaration under Section 564(b)(1) of the Act, 21 U.S.C. section 360bbb-3(b)(1), unless the authorization is terminated or revoked sooner.    Influenza A by PCR NEGATIVE NEGATIVE   Influenza B by PCR NEGATIVE NEGATIVE    Comment: (NOTE) The Xpert Xpress SARS-CoV-2/FLU/RSV assay is intended as an aid in  the diagnosis of influenza from Nasopharyngeal swab specimens and  should not be used as a sole basis for treatment. Nasal washings and  aspirates are unacceptable for Xpert Xpress SARS-CoV-2/FLU/RSV  testing. Fact Sheet for Patients: PinkCheek.be Fact Sheet for Healthcare Providers: GravelBags.it This test is not yet approved or cleared by the [place] FDA and  has been authorized for detection and/or diagnosis of SARS-CoV-2 by  FDA under an Emergency Use Authorization (EUA). This EUA will remain  in effect (meaning this test can be used) for the duration of the  Covid-19 declaration under Section 564(b)(1) of the Act, 21  U.S.C. section  360bbb-3(b)(1), unless the authorization is  terminated  or revoked. Performed at [facility], [place] [address], [place], [place] [address]   I-STAT 7, (LYTES, BLD GAS, ICA, H+H)     Status: Abnormal   Collection Time: 02/26/19  3:28 PM  Result Value Ref Range   pH, Arterial 7.299 (L) 7.350 - 7.450   pCO2 arterial 54.3 (H) 32.0 - 48.0 mmHg   pO2, Arterial 570.0 (H) 83.0 - 108.0 mmHg   Bicarbonate 26.6 20.0 - 28.0 mmol/L   TCO2 28 22 - 32 mmol/L   O2 Saturation 100.0 %   Acid-base deficit 1.0 0.0 - 2.0 mmol/L   Sodium 134 (L) 135 - 145 mmol/L   Potassium 3.4 (L) 3.5 - 5.1 mmol/L   Calcium, Ion 1.10 (L) 1.15 - 1.40 mmol/L   HCT 42.0 39.0 - 52.0 %   Hemoglobin 14.3 13.0 - 17.0 g/dL   Patient temperature HIDE    Collection site RADIAL, ALLEN'S TEST ACCEPTABLE    Drawn by MD    Sample type ARTERIAL   CBG monitoring, ED     Status: Abnormal   Collection Time: 02/26/19  3:50 PM  Result Value Ref Range   Glucose-Capillary >600 (HH) 70 - 99 mg/dL  Beta-hydroxybutyric acid     Status: None   Collection Time: 02/26/19  4:30 PM  Result Value Ref Range   Beta-Hydroxybutyric Acid 0.12 0.05 - 0.27 mmol/L    Comment: Performed at [facility], [place] [address], [place], [place] [address]  CBG monitoring, ED     Status: Abnormal   Collection Time: 02/26/19  4:32 PM  Result Value Ref Range   Glucose-Capillary >600 (HH) 70 - 99 mg/dL  Urine rapid drug screen (hosp performed)     Status: Abnormal   Collection Time: 02/26/19  4:45 PM  Result Value Ref Range   Opiates NONE DETECTED NONE DETECTED   Cocaine NONE DETECTED NONE DETECTED   Benzodiazepines POSITIVE (A) NONE DETECTED   Amphetamines NONE DETECTED NONE DETECTED   Tetrahydrocannabinol NONE DETECTED NONE DETECTED   Barbiturates NONE DETECTED NONE DETECTED    Comment: (NOTE) DRUG SCREEN FOR MEDICAL PURPOSES ONLY.  IF CONFIRMATION IS NEEDED FOR ANY PURPOSE, NOTIFY LAB WITHIN 5 DAYS. LOWEST DETECTABLE LIMITS FOR URINE DRUG  SCREEN Drug Class                     Cutoff (ng/mL) Amphetamine and metabolites    1000 Barbiturate and metabolites    200 Benzodiazepine                 540 Tricyclics and metabolites     300 Opiates and metabolites        300 Cocaine and metabolites        300 THC                            50 Performed at [facility], [place] [address], [place], [place] [address]   Urinalysis, Routine w reflex microscopic     Status: Abnormal   Collection Time: 02/26/19  4:45 PM  Result Value Ref Range   Color, Urine COLORLESS (A) YELLOW   APPearance CLEAR CLEAR   Specific Gravity, Urine 1.025 1.005 - 1.030   pH 7.0 5.0 - 8.0   Glucose, UA >=500 (A) NEGATIVE mg/dL   Hgb urine dipstick NEGATIVE NEGATIVE   Bilirubin Urine NEGATIVE NEGATIVE   Ketones, ur NEGATIVE NEGATIVE mg/dL   Protein, ur  NEGATIVE NEGATIVE mg/dL   Nitrite NEGATIVE NEGATIVE   Leukocytes,Ua NEGATIVE NEGATIVE   RBC / HPF 0-5 0 - 5 RBC/hpf   WBC, UA 0-5 0 - 5 WBC/hpf   Bacteria, UA RARE (A) NONE SEEN   Mucus PRESENT     Comment: Performed at [facility], [place] [address], [place], [place] [address]  POC SARS Coronavirus 2 Ag-ED - Nasal Swab (BD Veritor Kit)     Status: None   Collection Time: 02/26/19  5:04 PM  Result Value Ref Range   SARS Coronavirus 2 Ag NEGATIVE NEGATIVE    Comment: (NOTE) SARS-CoV-2 antigen NOT DETECTED.  Negative results are presumptive.  Negative results do not preclude SARS-CoV-2 infection and should not be used as the sole basis for treatment or other patient management decisions, including infection  control decisions, particularly in the presence of clinical signs and  symptoms consistent with COVID-19, or in those who have been in contact with the virus.  Negative results must be combined with clinical observations, patient history, and epidemiological information. The expected result is Negative. Fact Sheet for Patients: PodPark.tn Fact Sheet for  Healthcare Providers: GiftContent.is This test is not yet approved or cleared by the [place] FDA and  has been authorized for detection and/or diagnosis of SARS-CoV-2 by FDA under an Emergency Use Authorization (EUA).  This EUA will remain in effect (meaning this test can be used) for the duration of  the COVID-19 de claration under Section 564(b)(1) of the Act, 21 U.S.C. section 360bbb-3(b)(1), unless the authorization is terminated or revoked sooner.   CBG monitoring, ED     Status: Abnormal   Collection Time: 02/26/19  5:04 PM  Result Value Ref Range   Glucose-Capillary >600 (HH) 70 - 99 mg/dL   Comment 1 Notify RN   Glucose, capillary     Status: Abnormal   Collection Time: 02/26/19  5:27 PM  Result Value Ref Range   Glucose-Capillary >600 (HH) 70 - 99 mg/dL  MRSA PCR Screening     Status: None   Collection Time: 02/26/19  5:35 PM   Specimen: Nasal Mucosa; Nasopharyngeal  Result Value Ref Range   MRSA by PCR NEGATIVE NEGATIVE    Comment:        The GeneXpert MRSA Assay (FDA approved for NASAL specimens only), is one component of a comprehensive MRSA colonization surveillance program. It is not intended to diagnose MRSA infection nor to guide or monitor treatment for MRSA infections. Performed at [facility], [place] [address], [place], [place] [address]   Glucose, capillary     Status: Abnormal   Collection Time: 02/26/19  5:52 PM  Result Value Ref Range   Glucose-Capillary 583 (HH) 70 - 99 mg/dL  Glucose, capillary     Status: Abnormal   Collection Time: 02/26/19  6:29 PM  Result Value Ref Range   Glucose-Capillary 581 (HH) 70 - 99 mg/dL   Comment 1 Notify RN   Lactic acid, plasma     Status: Abnormal   Collection Time: 02/26/19  6:46 PM  Result Value Ref Range   Lactic Acid, Venous 6.4 (HH) 0.5 - 1.9 mmol/L    Comment: CRITICAL RESULT CALLED TO, READ BACK BY AND VERIFIED WITH: R.COE RN 1927 02/26/19 MCCORMICK K Performed at  [facility], [place] [address] [place], [place] [address]   Glucose, capillary     Status: Abnormal   Collection Time: 02/26/19  6:57 PM  Result Value Ref Range  Glucose-Capillary 446 (H) 70 - 99 mg/dL  HIV Antibody (routine testing w rflx)     Status: None   Collection Time: 02/26/19  8:24 PM  Result Value Ref Range   HIV Screen 4th Generation wRfx NON REACTIVE NON REACTIVE    Comment: Performed at [facility] [address] [place], [place] [address]  Basic metabolic panel     Status: Abnormal   Collection Time: 02/26/19  8:24 PM  Result Value Ref Range   Sodium 144 135 - 145 mmol/L   Potassium 2.9 (L) 3.5 - 5.1 mmol/L   Chloride 109 98 - 111 mmol/L   CO2 24 22 - 32 mmol/L   Glucose, Bld 349 (H) 70 - 99 mg/dL   BUN <5 (L) 8 - 23 mg/dL   Creatinine, Ser 0.67 0.61 - 1.24 mg/dL   Calcium 8.0 (L) 8.9 - 10.3 mg/dL   GFR calc non Af Amer >60 >60 mL/min   GFR calc Af Amer >60 >60 mL/min   Anion gap 11 5 - 15    Comment: Performed at [facility] [address], [place], [place] [address]  Lipase, blood     Status: None   Collection Time: 02/26/19  8:24 PM  Result Value Ref Range   Lipase 12 11 - 51 U/L    Comment: Performed at [facility] [address] [place], [place] [address]  Glucose, capillary     Status: Abnormal   Collection Time: 02/26/19  8:27 PM  Result Value Ref Range   Glucose-Capillary 305 (H) 70 - 99 mg/dL  Glucose, capillary     Status: Abnormal   Collection Time: 02/26/19  9:27 PM  Result Value Ref Range   Glucose-Capillary 335 (H) 70 - 99 mg/dL  Glucose, capillary     Status: Abnormal   Collection Time: 02/26/19 10:26 PM  Result Value Ref Range   Glucose-Capillary 200 (H) 70 - 99 mg/dL  Glucose, capillary     Status: Abnormal   Collection Time: 02/26/19 11:23 PM  Result Value Ref Range   Glucose-Capillary 119 (H) 70 - 99 mg/dL  Basic metabolic panel     Status: Abnormal   Collection Time: 02/27/19 12:18 AM  Result Value Ref  Range   Sodium 145 135 - 145 mmol/L   Potassium 3.9 3.5 - 5.1 mmol/L   Chloride 114 (H) 98 - 111 mmol/L   CO2 18 (L) 22 - 32 mmol/L   Glucose, Bld 108 (H) 70 - 99 mg/dL   BUN 5 (L) 8 - 23 mg/dL   Creatinine, Ser 0.67 0.61 - 1.24 mg/dL   Calcium 8.2 (L) 8.9 - 10.3 mg/dL   GFR calc non Af Amer >60 >60 mL/min   GFR calc Af Amer >60 >60 mL/min   Anion gap 13 5 - 15    Comment: Performed at [facility], [place] [address], [place], [place] [address]  Beta-hydroxybutyric acid     Status: None   Collection Time: 02/27/19 12:18 AM  Result Value Ref Range   Beta-Hydroxybutyric Acid 0.09 0.05 - 0.27 mmol/L    Comment: Performed at [facility] [address], [place], [place] [address]  Glucose, capillary     Status: Abnormal   Collection Time: 02/27/19  1:18 AM  Result Value Ref Range   Glucose-Capillary 107 (H) 70 - 99 mg/dL  Glucose, capillary     Status: Abnormal   Collection Time: 02/27/19  2:19 AM  Result Value Ref Range   Glucose-Capillary 140 (H) 70 - 99 mg/dL  Glucose, capillary     Status: None   Collection Time: 02/27/19  3:16 AM  Result Value Ref Range   Glucose-Capillary 98 70 - 99 mg/dL  Glucose, capillary     Status: None   Collection Time: 02/27/19  4:14 AM  Result Value Ref Range   Glucose-Capillary 99 70 - 99 mg/dL  I-STAT 7, (LYTES, BLD GAS, ICA, H+H)     Status: Abnormal   Collection Time: 02/27/19  4:33 AM  Result Value Ref Range   pH, Arterial 7.424 7.350 - 7.450   pCO2 arterial 39.7 32.0 - 48.0 mmHg   pO2, Arterial 155.0 (H) 83.0 - 108.0 mmHg   Bicarbonate 25.7 20.0 - 28.0 mmol/L   TCO2 27 22 - 32 mmol/L   O2 Saturation 99.0 %   Acid-Base Excess 2.0 0.0 - 2.0 mmol/L   Sodium 144 135 - 145 mmol/L   Potassium 2.7 (LL) 3.5 - 5.1 mmol/L   Calcium, Ion 1.20 1.15 - 1.40 mmol/L   HCT 36.0 (L) 39.0 - 52.0 %   Hemoglobin 12.2 (L) 13.0 - 17.0 g/dL   Patient temperature 100.4 F    Collection site RADIAL, ALLEN'S TEST ACCEPTABLE    Drawn by RT    Sample  type ARTERIAL    Comment NOTIFIED PHYSICIAN   Basic metabolic panel     Status: Abnormal   Collection Time: 02/27/19  4:57 AM  Result Value Ref Range   Sodium 146 (H) 135 - 145 mmol/L   Potassium 4.1 3.5 - 5.1 mmol/L   Chloride 110 98 - 111 mmol/L   CO2 23 22 - 32 mmol/L   Glucose, Bld 108 (H) 70 - 99 mg/dL   BUN <5 (L) 8 - 23 mg/dL   Creatinine, Ser 0.64 0.61 - 1.24 mg/dL   Calcium 8.2 (L) 8.9 - 10.3 mg/dL   GFR calc non Af Amer >60 >60 mL/min   GFR calc Af Amer >60 >60 mL/min   Anion gap 13 5 - 15    Comment: Performed at [facility] [address], [place], [place] [address]  Magnesium     Status: Abnormal   Collection Time: 02/27/19  4:57 AM  Result Value Ref Range   Magnesium 1.6 (L) 1.7 - 2.4 mg/dL    Comment: Performed at [facility] [address], [place], [place] [address]  Phosphorus     Status: Abnormal   Collection Time: 02/27/19  4:57 AM  Result Value Ref Range   Phosphorus 1.7 (L) 2.5 - 4.6 mg/dL    Comment: Performed at [facility] [address], [place], [place] [address]  Glucose, capillary     Status: None   Collection Time: 02/27/19  5:11 AM  Result Value Ref Range   Glucose-Capillary 98 70 - 99 mg/dL  Glucose, capillary     Status: Abnormal   Collection Time: 02/27/19  6:16 AM  Result Value Ref Range   Glucose-Capillary 101 (H) 70 - 99 mg/dL  Glucose, capillary     Status: Abnormal   Collection Time: 02/27/19  7:42 AM  Result Value Ref Range   Glucose-Capillary 112 (H) 70 - 99 mg/dL    Recent Results (from the past 240 hour(s))  Respiratory Panel by RT PCR (Flu A&B, Covid) - Nasopharyngeal Swab     Status: None   Collection Time: 02/26/19  2:55 PM   Specimen: Nasopharyngeal  Swab  Result Value Ref Range Status   SARS Coronavirus 2 by RT PCR NEGATIVE NEGATIVE Final    Comment: (NOTE) SARS-CoV-2 target nucleic acids are NOT DETECTED. The SARS-CoV-2 RNA is generally detectable in upper respiratoy specimens during the acute  phase of infection. The lowest concentration of SARS-CoV-2 viral copies this assay can detect is 131 copies/mL. A negative result does not preclude SARS-Cov-2 infection and should not be used as the sole basis for treatment or other patient management decisions. A negative result may occur with  improper specimen collection/handling, submission of specimen other than nasopharyngeal swab, presence of viral mutation(s) within the areas targeted by this assay, and inadequate number of viral copies (<131 copies/mL). A negative result must be combined with clinical observations, patient history, and epidemiological information. The expected result is Negative. Fact Sheet for Patients:  PinkCheek.be Fact Sheet for Healthcare Providers:  GravelBags.it This test is not yet ap proved or cleared by the [place] FDA and  has been authorized for detection and/or diagnosis of SARS-CoV-2 by FDA under an Emergency Use Authorization (EUA). This EUA will remain  in effect (meaning this test can be used) for the duration of the COVID-19 declaration under Section 564(b)(1) of the Act, 21 U.S.C. section 360bbb-3(b)(1), unless the authorization is terminated or revoked sooner.    Influenza A by PCR NEGATIVE NEGATIVE Final   Influenza B by PCR NEGATIVE NEGATIVE Final    Comment: (NOTE) The Xpert Xpress SARS-CoV-2/FLU/RSV assay is intended as an aid in  the diagnosis of influenza from Nasopharyngeal swab specimens and  should not be used as a sole basis for treatment. Nasal washings and  aspirates are unacceptable for Xpert Xpress SARS-CoV-2/FLU/RSV  testing. Fact Sheet for Patients: PinkCheek.be Fact Sheet for Healthcare Providers: GravelBags.it This test is not yet approved or cleared by the [place] FDA and  has been authorized for detection and/or diagnosis of SARS-CoV-2 by   FDA under an Emergency Use Authorization (EUA). This EUA will remain  in effect (meaning this test can be used) for the duration of the  Covid-19 declaration under Section 564(b)(1) of the Act, 21  U.S.C. section 360bbb-3(b)(1), unless the authorization is  terminated or revoked. Performed at [facility], [place] [address] [place], [place] [address]   MRSA PCR Screening     Status: None   Collection Time: 02/26/19  5:35 PM   Specimen: Nasal Mucosa; Nasopharyngeal  Result Value Ref Range Status   MRSA by PCR NEGATIVE NEGATIVE Final    Comment:        The GeneXpert MRSA Assay (FDA approved for NASAL specimens only), is one component of a comprehensive MRSA colonization surveillance program. It is not intended to diagnose MRSA infection nor to guide or monitor treatment for MRSA infections. Performed at [facility], [place] [address], [place], [place] [address]     Lipid Panel No results for input(s): CHOL, TRIG, HDL, CHOLHDL, VLDL, LDLCALC in the last 72 hours.  Studies/Results: EEG  Result Date: 02/26/2019 Lora Havens, MD     02/26/2019  3:09 PM Patient Name: Michael [name] MRN: [id number] Epilepsy Attending: Lora Havens Referring Physician/Provider: Dr Kerney Elbe Date: 02/26/2019 Duration: 29.41 mins Patient history: 64 year old male with history of hypertension who presented with 2 witnessed generalized tonic-clonic seizures in the setting of blood glucose more than 700.  On examination, patient is obtunded and not responding to noxious stimuli.  EEG to evaluate for subclinical seizures/status epilepticus. Level of  alertness: comatose AEDs during EEG study: Keppra, versed Technical aspects: This EEG study was done with scalp electrodes positioned according to the 10-20 International system of electrode placement. Electrical activity was acquired at a sampling rate of 500Hz  and reviewed with a high frequency filter of 70Hz  and a low frequency filter of 1Hz . EEG data  were recorded continuously and digitally stored. Description: EEG showed continuous generalized low amplitude 3 to 7 Hz theta-delta slowing admixed with 15 to 18 Hz generalized beta activity.  EEG was reactive to noxious stimulation. Hyperventilation and photic stimulation were not performed due to AMS. Abnormality -Continuous slow, generalized IMPRESSION: This study is suggestive of severe diffuse encephalopathy, nonspecific etiology but likely secondary to sedation and postictal state. No seizures or epileptiform discharges were seen throughout the recording. Dr. Kerney Elbe was notified. Lora Havens   CT Code Stroke CTA Head W/WO contrast  Result Date: 02/26/2019 CLINICAL DATA:  65 year old male code stroke presentation. Last seen normal 1300 hours. EXAM: CT ANGIOGRAPHY HEAD AND NECK TECHNIQUE: Multidetector CT imaging of the head and neck was performed using the standard protocol during bolus administration of intravenous contrast. Multiplanar CT image reconstructions and MIPs were obtained to evaluate the vascular anatomy. Carotid stenosis measurements (when applicable) are obtained utilizing NASCET criteria, using the distal internal carotid diameter as the denominator. CONTRAST:  75 milliliters Omnipaque 350. COMPARISON:  Head CT 1352 hours today. CTA head and neck 03/26/2017. FINDINGS: CTA NECK Skeleton: Absent dentition. No acute osseous abnormality identified. Upper chest: Fluid-filled thoracic esophagus but otherwise negative visible upper chest. Other neck: Mild motion artifact. There is a right nasal airway in place. No neck mass or lymphadenopathy identified. Aortic arch: 4 vessel arch configuration, the left vertebral artery arises directly from the arch. Mild arch atherosclerosis. Right carotid system: Minor brachiocephalic calcified plaque. Tortuous proximal right CCA. Chronic calcified plaque at the level of the right thyroid but no significant stenosis. Negative right carotid bifurcation.  Mild calcified plaque just below the skull base but no cervical right ICA stenosis. Left carotid system: Mild left CCA origin plaque without stenosis. Stable mild calcified plaque at the left ICA origin and bulb without stenosis. Patent left ICA to the skull base. Vertebral arteries: Normal proximal right subclavian artery and right vertebral artery origin. The right vertebral is non dominant and diminutive but remains patent to the skull base without stenosis. The dominant left vertebral artery arises directly from the arch with mild calcification at its origin but no stenosis. The left vertebral remains patent to the skull base without stenosis. CTA HEAD Posterior circulation: Dominant left vertebral artery, the right functionally terminates in PICA. Mild left V4 segment plaque without stenosis. Normal left PICA origin. Patent basilar artery without stenosis. Fetal type PCA origins more so the left. SCA origins are patent. Bilateral PCA branches are stable and within normal limits allowing for mild motion artifact. Anterior circulation: Both ICA siphons are patent with chronic calcified plaque not resulting in significant stenosis. Normal an normal MCA and ACA origins. Posterior communicating artery origins are within normal limits. Diminutive or absent anterior communicating artery. Bilateral ACA branches are stable and within normal limits. Left MCA M1 segment bifurcates early without stenosis. Left MCA branches are stable and within normal limits. Right MCA M1 segment and bifurcation are patent without stenosis. Right MCA branches are stable and within normal limits. Venous sinuses: Patent. Dominant right transverse and sigmoid sinuses. Anatomic variants: Left vertebral artery is dominant and arises directly from the arch. Fetal type  PCA origins, more so the left. Dominant right transverse and sigmoid sinuses. Review of the MIP images confirms the above findings IMPRESSION: 1. Negative for large vessel  occlusion. 2. Stable arterial findings on CTA since 2019. Carotid atherosclerosis but no hemodynamically significant stenosis. 3. Fluid-filled thoracic esophagus noted but of unclear significance. Right nasal airway in place. Findings discussed by telephone and text page with Dr. Kerney Elbe on 02/26/2019 at 14:16 . Electronically Signed   By: Genevie Ann M.D.   On: 02/26/2019 14:19   CT Code Stroke CTA Neck W/WO contrast  Result Date: 02/26/2019 CLINICAL DATA:  64 year old male code stroke presentation. Last seen normal 1300 hours. EXAM: CT ANGIOGRAPHY HEAD AND NECK TECHNIQUE: Multidetector CT imaging of the head and neck was performed using the standard protocol during bolus administration of intravenous contrast. Multiplanar CT image reconstructions and MIPs were obtained to evaluate the vascular anatomy. Carotid stenosis measurements (when applicable) are obtained utilizing NASCET criteria, using the distal internal carotid diameter as the denominator. CONTRAST:  75 milliliters Omnipaque 350. COMPARISON:  Head CT 1352 hours today. CTA head and neck 03/26/2017. FINDINGS: CTA NECK Skeleton: Absent dentition. No acute osseous abnormality identified. Upper chest: Fluid-filled thoracic esophagus but otherwise negative visible upper chest. Other neck: Mild motion artifact. There is a right nasal airway in place. No neck mass or lymphadenopathy identified. Aortic arch: 4 vessel arch configuration, the left vertebral artery arises directly from the arch. Mild arch atherosclerosis. Right carotid system: Minor brachiocephalic calcified plaque. Tortuous proximal right CCA. Chronic calcified plaque at the level of the right thyroid but no significant stenosis. Negative right carotid bifurcation. Mild calcified plaque just below the skull base but no cervical right ICA stenosis. Left carotid system: Mild left CCA origin plaque without stenosis. Stable mild calcified plaque at the left ICA origin and bulb without stenosis.  Patent left ICA to the skull base. Vertebral arteries: Normal proximal right subclavian artery and right vertebral artery origin. The right vertebral is non dominant and diminutive but remains patent to the skull base without stenosis. The dominant left vertebral artery arises directly from the arch with mild calcification at its origin but no stenosis. The left vertebral remains patent to the skull base without stenosis. CTA HEAD Posterior circulation: Dominant left vertebral artery, the right functionally terminates in PICA. Mild left V4 segment plaque without stenosis. Normal left PICA origin. Patent basilar artery without stenosis. Fetal type PCA origins more so the left. SCA origins are patent. Bilateral PCA branches are stable and within normal limits allowing for mild motion artifact. Anterior circulation: Both ICA siphons are patent with chronic calcified plaque not resulting in significant stenosis. Normal an normal MCA and ACA origins. Posterior communicating artery origins are within normal limits. Diminutive or absent anterior communicating artery. Bilateral ACA branches are stable and within normal limits. Left MCA M1 segment bifurcates early without stenosis. Left MCA branches are stable and within normal limits. Right MCA M1 segment and bifurcation are patent without stenosis. Right MCA branches are stable and within normal limits. Venous sinuses: Patent. Dominant right transverse and sigmoid sinuses. Anatomic variants: Left vertebral artery is dominant and arises directly from the arch. Fetal type PCA origins, more so the left. Dominant right transverse and sigmoid sinuses. Review of the MIP images confirms the above findings IMPRESSION: 1. Negative for large vessel occlusion. 2. Stable arterial findings on CTA since 2019. Carotid atherosclerosis but no hemodynamically significant stenosis. 3. Fluid-filled thoracic esophagus noted but of unclear significance. Right nasal airway  in place. Findings  discussed by telephone and text page with Dr. Kerney Elbe on 02/26/2019 at 14:16 . Electronically Signed   By: Genevie Ann M.D.   On: 02/26/2019 14:19   Portable Chest xray  Result Date: 02/27/2019 CLINICAL DATA:  Acute renal failure EXAM: PORTABLE CHEST 1 VIEW COMPARISON:  02/26/2019 FINDINGS: Endotracheal tube and gastric catheter are noted in satisfactory position. Increasing infiltrates are noted particularly in the right upper lobe when compared with the prior exam. No sizable effusion is seen. No bony abnormality is noted. IMPRESSION: Tubes and lines stable in appearance. Increase in infiltrate particularly within the right lung. Electronically Signed   By: Inez Catalina M.D.   On: 02/27/2019 03:41   DG Chest Port 1 View  Result Date: 02/26/2019 CLINICAL DATA:  Endotracheal tube placement EXAM: PORTABLE CHEST 1 VIEW COMPARISON:  02/02/2018 FINDINGS: Endotracheal tube is approximately 3 cm above the carina. Enteric tube terminates within the stomach. No new consolidation or edema. No pleural effusion or pneumothorax. Stable cardiomediastinal contours with normal heart size. IMPRESSION: No acute process in the chest. Electronically Signed   By: Macy Mis M.D.   On: 02/26/2019 14:38   CT HEAD CODE STROKE WO CONTRAST  Result Date: 02/26/2019 CLINICAL DATA:  Code stroke. 64 year old male with left gaze and abnormal speech. EXAM: CT HEAD WITHOUT CONTRAST TECHNIQUE: Contiguous axial images were obtained from the base of the skull through the vertex without intravenous contrast. COMPARISON:  Brain MRI and head CT 03/26/2017. FINDINGS: Brain: Mild motion artifact. No midline shift, mass effect, or evidence of intracranial mass lesion. No ventriculomegaly. No acute intracranial hemorrhage identified. Patchy chronic white matter hypodensity in both hemispheres. No superimposed acute cortically based infarct identified. Vascular: Calcified atherosclerosis at the skull base. Questionable abnormal hyperdensity at  the left ICA terminus on series 5, image 27, but occurs in an area of streak artifact. Skull: No acute osseous abnormality identified. Sinuses/Orbits: Chronic maxillary sinus periosteal thickening with new left maxillary fluid level since 2019. Other Visualized paranasal sinuses and mastoids are stable and well pneumatized. Other: No acute orbit or scalp soft tissue finding. ASPECTS [facility] Stroke Program Early CT Score) Total score (0-10 with 10 being normal): 10 IMPRESSION: 1. Questionable hyperdensity at the left ICA terminus, but no acute cortically based infarct or acute intracranial hemorrhage identified. ASPECTS is 10. 2. Stable cerebral white matter disease since 2019. 3. These results were communicated to Dr. Cheral Marker at 2:00 pm on 02/26/2019 by text page via the [facility] messaging system. Electronically Signed   By: Genevie Ann M.D.   On: 02/26/2019 14:01    Medications:  Scheduled: . ARIPiprazole  2 mg Per Tube Daily  . chlorhexidine gluconate (MEDLINE KIT)  15 mL Mouth Rinse BID  . Chlorhexidine Gluconate Cloth  6 each Topical Daily  . DULoxetine  30 mg Oral Daily  . folic acid  1 mg Per Tube Daily  . gabapentin  900 mg Per Tube TID  . heparin  5,000 Units Subcutaneous Q8H  . insulin aspart  2-6 Units Subcutaneous Q4H  . insulin detemir  5 Units Subcutaneous Q12H  . mouth rinse  15 mL Mouth Rinse 10 times per day  . pantoprazole (PROTONIX) IV  40 mg Intravenous Daily  . thiamine  100 mg Per Tube Daily   Continuous: . dextrose 5% lactated ringers 100 mL/hr at 02/27/19 0600  . insulin Stopped (02/27/19 0513)  . lactated ringers Stopped (02/26/19 2251)  . levETIRAcetam Stopped (02/27/19 0336)  EEG: This study is suggestive of severe diffuse encephalopathy, nonspecific to etiology but likely secondary to sedation and postictal state. No seizures or epileptiform discharges were seen throughout the recording.   Assessment:  64 year old male who presents to the ED as a code stroke after a  witnessed seizure in a vehicle followed by a second seizure en route witnessed by EMS.  1. Exam today reveals patient awake, trying to leave AMA, speaking well with some dysarthria secondary to being edentulous. He follows commands and exhibits no confusion.  No seizures or seizure-like activity noted 2. CT head in the ED showed atrophy without any intraparenchymal hemorrhage. 3. EEG: This study is suggestive of severe diffuse encephalopathy, nonspecific etiology but likely secondary to sedation and postictal state. No seizures or epileptiform discharges were seen throughout the recording. 4. Most likely etiology for the seizures was severe hyperglycemia. The patient had a CBG of >700 in the ED yesterday. Glucose this AM is still markedly elevated at 841. He has been diagnosed with HHS.  5. Also noted to be hypokalemic on admission at 2.3. Potassium has improved to 3.2 this AM, but is still elevated.   6. Most recent glucose 151, much improved  Recommendations: - Seizure precautions - Continue Keppra 500 mg twice daily for now. After correction of blood glucose levels, Keppra can most likely be stopped/tapered,  -- Frequent neuro checks -- Management of HHS per primary team.   35 minutes spent in the neurological evaluation and management of this critically ill patient.    LOS: 1 day   @Electronically  signed: Dr. Kerney Elbe 02/27/2019  8:06 AM

## 2019-02-27 NOTE — Progress Notes (Signed)
RT obtained ABG on pt with the following results. No changes needed at this time. Pt is stable on the vent at this time. RT will continue to monitor.   Results for Michael [name], Michael [name] (MRN [id number]) as of 02/27/2019 05:00  Ref. Range 02/27/2019 04:33  Sample type Unknown ARTERIAL  pH, Arterial Latest Ref Range: 7.350 - 7.450  7.424  pCO2 arterial Latest Ref Range: 32.0 - 48.0 mmHg 39.7  pO2, Arterial Latest Ref Range: 83.0 - 108.0 mmHg 155.0 (H)  TCO2 Latest Ref Range: 22 - 32 mmol/L 27  Acid-Base Excess Latest Ref Range: 0.0 - 2.0 mmol/L 2.0  Bicarbonate Latest Ref Range: 20.0 - 28.0 mmol/L 25.7  O2 Saturation Latest Units: % 99.0  Patient temperature Unknown 100.4 F  Collection site Unknown RADIAL, ALLEN'S TEST ACCEPTABLE

## 2019-03-07 LAB — CULTURE, BLOOD (ROUTINE X 2): Culture: NO GROWTH

## 2019-03-10 ENCOUNTER — Emergency Department (HOSPITAL_COMMUNITY): Payer: Medicare (Managed Care)

## 2019-03-10 ENCOUNTER — Encounter (HOSPITAL_COMMUNITY): Payer: Self-pay | Admitting: Emergency Medicine

## 2019-03-10 ENCOUNTER — Inpatient Hospital Stay (HOSPITAL_COMMUNITY)
Admission: EM | Admit: 2019-03-10 | Discharge: 2019-03-19 | DRG: 871 | Disposition: A | Discharge status: Home Health | Payer: Medicare (Managed Care) | Attending: Internal Medicine | Admitting: Internal Medicine

## 2019-03-10 ENCOUNTER — Other Ambulatory Visit: Payer: Self-pay

## 2019-03-10 DIAGNOSIS — J690 Pneumonitis due to inhalation of food and vomit: Secondary | ICD-10-CM | POA: Diagnosis present

## 2019-03-10 DIAGNOSIS — E160 Drug-induced hypoglycemia without coma: Secondary | ICD-10-CM | POA: Diagnosis present

## 2019-03-10 DIAGNOSIS — W19XXXA Unspecified fall, initial encounter: Secondary | ICD-10-CM

## 2019-03-10 DIAGNOSIS — E162 Hypoglycemia, unspecified: Secondary | ICD-10-CM | POA: Diagnosis present

## 2019-03-10 DIAGNOSIS — F1020 Alcohol dependence, uncomplicated: Secondary | ICD-10-CM | POA: Diagnosis not present

## 2019-03-10 DIAGNOSIS — T68XXXA Hypothermia, initial encounter: Secondary | ICD-10-CM | POA: Diagnosis present

## 2019-03-10 DIAGNOSIS — T68XXXD Hypothermia, subsequent encounter: Secondary | ICD-10-CM | POA: Diagnosis not present

## 2019-03-10 DIAGNOSIS — E11649 Type 2 diabetes mellitus with hypoglycemia without coma: Secondary | ICD-10-CM | POA: Diagnosis present

## 2019-03-10 DIAGNOSIS — K219 Gastro-esophageal reflux disease without esophagitis: Secondary | ICD-10-CM | POA: Diagnosis present

## 2019-03-10 DIAGNOSIS — K7040 Alcoholic hepatic failure without coma: Secondary | ICD-10-CM | POA: Diagnosis present

## 2019-03-10 DIAGNOSIS — R6520 Severe sepsis without septic shock: Secondary | ICD-10-CM | POA: Diagnosis present

## 2019-03-10 DIAGNOSIS — R0600 Dyspnea, unspecified: Secondary | ICD-10-CM

## 2019-03-10 DIAGNOSIS — Z7982 Long term (current) use of aspirin: Secondary | ICD-10-CM | POA: Diagnosis not present

## 2019-03-10 DIAGNOSIS — Z681 Body mass index (BMI) 19 or less, adult: Secondary | ICD-10-CM

## 2019-03-10 DIAGNOSIS — G9341 Metabolic encephalopathy: Secondary | ICD-10-CM | POA: Diagnosis present

## 2019-03-10 DIAGNOSIS — A419 Sepsis, unspecified organism: Secondary | ICD-10-CM | POA: Diagnosis not present

## 2019-03-10 DIAGNOSIS — F209 Schizophrenia, unspecified: Secondary | ICD-10-CM | POA: Diagnosis present

## 2019-03-10 DIAGNOSIS — Z79899 Other long term (current) drug therapy: Secondary | ICD-10-CM

## 2019-03-10 DIAGNOSIS — Z20822 Contact with and (suspected) exposure to covid-19: Secondary | ICD-10-CM | POA: Diagnosis present

## 2019-03-10 DIAGNOSIS — E1142 Type 2 diabetes mellitus with diabetic polyneuropathy: Secondary | ICD-10-CM | POA: Diagnosis present

## 2019-03-10 DIAGNOSIS — J9601 Acute respiratory failure with hypoxia: Secondary | ICD-10-CM | POA: Diagnosis present

## 2019-03-10 DIAGNOSIS — Z23 Encounter for immunization: Secondary | ICD-10-CM | POA: Diagnosis present

## 2019-03-10 DIAGNOSIS — T383X5A Adverse effect of insulin and oral hypoglycemic [antidiabetic] drugs, initial encounter: Secondary | ICD-10-CM | POA: Diagnosis present

## 2019-03-10 DIAGNOSIS — E0781 Sick-euthyroid syndrome: Secondary | ICD-10-CM | POA: Diagnosis present

## 2019-03-10 DIAGNOSIS — Z791 Long term (current) use of non-steroidal anti-inflammatories (NSAID): Secondary | ICD-10-CM | POA: Diagnosis not present

## 2019-03-10 DIAGNOSIS — R0603 Acute respiratory distress: Secondary | ICD-10-CM | POA: Diagnosis not present

## 2019-03-10 DIAGNOSIS — Z833 Family history of diabetes mellitus: Secondary | ICD-10-CM

## 2019-03-10 DIAGNOSIS — F17210 Nicotine dependence, cigarettes, uncomplicated: Secondary | ICD-10-CM | POA: Diagnosis present

## 2019-03-10 DIAGNOSIS — I110 Hypertensive heart disease with heart failure: Secondary | ICD-10-CM | POA: Diagnosis present

## 2019-03-10 DIAGNOSIS — Z720 Tobacco use: Secondary | ICD-10-CM

## 2019-03-10 DIAGNOSIS — J441 Chronic obstructive pulmonary disease with (acute) exacerbation: Secondary | ICD-10-CM | POA: Diagnosis present

## 2019-03-10 DIAGNOSIS — E876 Hypokalemia: Secondary | ICD-10-CM | POA: Diagnosis present

## 2019-03-10 DIAGNOSIS — E872 Acidosis: Secondary | ICD-10-CM | POA: Diagnosis present

## 2019-03-10 DIAGNOSIS — E1165 Type 2 diabetes mellitus with hyperglycemia: Secondary | ICD-10-CM | POA: Diagnosis present

## 2019-03-10 DIAGNOSIS — E43 Unspecified severe protein-calorie malnutrition: Secondary | ICD-10-CM | POA: Diagnosis present

## 2019-03-10 DIAGNOSIS — I5041 Acute combined systolic (congestive) and diastolic (congestive) heart failure: Secondary | ICD-10-CM | POA: Diagnosis present

## 2019-03-10 DIAGNOSIS — Z794 Long term (current) use of insulin: Secondary | ICD-10-CM

## 2019-03-10 DIAGNOSIS — G40909 Epilepsy, unspecified, not intractable, without status epilepticus: Secondary | ICD-10-CM | POA: Diagnosis present

## 2019-03-10 DIAGNOSIS — K860 Alcohol-induced chronic pancreatitis: Secondary | ICD-10-CM | POA: Diagnosis present

## 2019-03-10 DIAGNOSIS — R569 Unspecified convulsions: Secondary | ICD-10-CM | POA: Diagnosis not present

## 2019-03-10 LAB — PROTIME-INR
INR: 1.1 (ref 0.8–1.2)
Prothrombin Time: 14.1 seconds (ref 11.4–15.2)

## 2019-03-10 LAB — CBC WITH DIFFERENTIAL/PLATELET
Abs Immature Granulocytes: 0.08 10*3/uL — ABNORMAL HIGH (ref 0.00–0.07)
Basophils Absolute: 0 10*3/uL (ref 0.0–0.1)
Basophils Relative: 0 %
Eosinophils Absolute: 0 10*3/uL (ref 0.0–0.5)
Eosinophils Relative: 0 %
HCT: 35.8 % — ABNORMAL LOW (ref 39.0–52.0)
Hemoglobin: 12.5 g/dL — ABNORMAL LOW (ref 13.0–17.0)
Immature Granulocytes: 1 %
Lymphocytes Relative: 10 %
Lymphs Abs: 1.3 10*3/uL (ref 0.7–4.0)
MCH: 32.9 pg (ref 26.0–34.0)
MCHC: 34.9 g/dL (ref 30.0–36.0)
MCV: 94.2 fL (ref 80.0–100.0)
Monocytes Absolute: 0.7 10*3/uL (ref 0.1–1.0)
Monocytes Relative: 6 %
Neutro Abs: 10.2 10*3/uL — ABNORMAL HIGH (ref 1.7–7.7)
Neutrophils Relative %: 83 %
Platelets: 190 10*3/uL (ref 150–400)
RBC: 3.8 MIL/uL — ABNORMAL LOW (ref 4.22–5.81)
RDW: 14.6 % (ref 11.5–15.5)
WBC: 12.3 10*3/uL — ABNORMAL HIGH (ref 4.0–10.5)
nRBC: 0 % (ref 0.0–0.2)

## 2019-03-10 LAB — CBG MONITORING, ED
Glucose-Capillary: 230 mg/dL — ABNORMAL HIGH (ref 70–99)
Glucose-Capillary: 94 mg/dL (ref 70–99)
Glucose-Capillary: 22 mg/dL — CL (ref 70–99)
Glucose-Capillary: 123 mg/dL — ABNORMAL HIGH (ref 70–99)
Glucose-Capillary: 138 mg/dL — ABNORMAL HIGH (ref 70–99)

## 2019-03-10 LAB — BLOOD GAS, ARTERIAL
Acid-Base Excess: 5.5 mmol/L — ABNORMAL HIGH (ref 0.0–2.0)
Bicarbonate: 30.2 mmol/L — ABNORMAL HIGH (ref 20.0–28.0)
Drawn by: 36527
FIO2: 32
O2 Saturation: 80.5 %
Patient temperature: 37
pCO2 arterial: 50.5 mmHg — ABNORMAL HIGH (ref 32.0–48.0)
pH, Arterial: 7.394 (ref 7.350–7.450)
pO2, Arterial: 46.5 mmHg — ABNORMAL LOW (ref 83.0–108.0)

## 2019-03-10 LAB — COMPREHENSIVE METABOLIC PANEL
ALT: 37 U/L (ref 0–44)
AST: 39 U/L (ref 15–41)
Albumin: 2 g/dL — ABNORMAL LOW (ref 3.5–5.0)
Alkaline Phosphatase: 257 U/L — ABNORMAL HIGH (ref 38–126)
Anion gap: 12 (ref 5–15)
BUN: 6 mg/dL — ABNORMAL LOW (ref 8–23)
CO2: 28 mmol/L (ref 22–32)
Calcium: 8.2 mg/dL — ABNORMAL LOW (ref 8.9–10.3)
Chloride: 105 mmol/L (ref 98–111)
Creatinine, Ser: 0.6 mg/dL — ABNORMAL LOW (ref 0.61–1.24)
GFR calc Af Amer: >60 mL/min (ref >60)
GFR calc non Af Amer: >60 mL/min (ref >60)
Glucose, Bld: 58 mg/dL — ABNORMAL LOW (ref 70–99)
Potassium: 2.9 mmol/L — ABNORMAL LOW (ref 3.5–5.1)
Sodium: 145 mmol/L (ref 135–145)
Total Bilirubin: 1.4 mg/dL — ABNORMAL HIGH (ref 0.3–1.2)
Total Protein: 4.7 g/dL — ABNORMAL LOW (ref 6.5–8.1)

## 2019-03-10 LAB — ACETAMINOPHEN LEVEL: Acetaminophen (Tylenol), Serum: <10 ug/mL — ABNORMAL LOW (ref 10–30)

## 2019-03-10 LAB — TSH: TSH: 0.382 u[IU]/mL (ref 0.350–4.500)

## 2019-03-10 LAB — CORTISOL: Cortisol, Plasma: 34.2 ug/dL

## 2019-03-10 LAB — TROPONIN I (HIGH SENSITIVITY): Troponin I (High Sensitivity): 19 ng/L — ABNORMAL HIGH (ref <18)

## 2019-03-10 LAB — LACTIC ACID, PLASMA
Lactic Acid, Venous: 3.5 mmol/L (ref 0.5–1.9)
Lactic Acid, Venous: 2.7 mmol/L (ref 0.5–1.9)

## 2019-03-10 LAB — ETHANOL: Alcohol, Ethyl (B): <10 mg/dL (ref <10)

## 2019-03-10 LAB — GLUCOSE, CAPILLARY
Glucose-Capillary: 131 mg/dL — ABNORMAL HIGH (ref 70–99)
Glucose-Capillary: 30 mg/dL — CL (ref 70–99)

## 2019-03-10 LAB — MAGNESIUM: Magnesium: 1.7 mg/dL (ref 1.7–2.4)

## 2019-03-10 LAB — APTT: aPTT: 25 seconds (ref 24–36)

## 2019-03-10 LAB — LIPASE, BLOOD: Lipase: 12 U/L (ref 11–51)

## 2019-03-10 LAB — SALICYLATE LEVEL: Salicylate Lvl: <7 mg/dL — ABNORMAL LOW (ref 7.0–30.0)

## 2019-03-10 LAB — AMMONIA: Ammonia: 60 umol/L — ABNORMAL HIGH (ref 9–35)

## 2019-03-10 MED ORDER — POTASSIUM CHLORIDE 10 MEQ/100ML IV SOLN
10.0000 meq | INTRAVENOUS | Status: AC
  Administered 2019-03-10 (×3): 10 meq via INTRAVENOUS
  Filled 2019-03-10 (×3): qty 100

## 2019-03-10 MED ORDER — LORAZEPAM 2 MG/ML IJ SOLN
1.0000 mg | INTRAMUSCULAR | Status: AC | PRN
  Administered 2019-03-12: 3 mg via INTRAVENOUS
  Administered 2019-03-12: 2 mg via INTRAVENOUS
  Filled 2019-03-10: qty 2
  Filled 2019-03-10: qty 1
  Filled 2019-03-10: qty 2

## 2019-03-10 MED ORDER — PIPERACILLIN-TAZOBACTAM 3.375 G IVPB
3.3750 g | Freq: Three times a day (TID) | INTRAVENOUS | Status: DC
  Administered 2019-03-11 – 2019-03-15 (×14): 3.375 g via INTRAVENOUS
  Filled 2019-03-10 (×19): qty 50

## 2019-03-10 MED ORDER — THIAMINE HCL 100 MG/ML IJ SOLN
100.0000 mg | Freq: Once | INTRAMUSCULAR | Status: AC
  Administered 2019-03-10: 14:00:00 100 mg via INTRAVENOUS
  Filled 2019-03-10: qty 2

## 2019-03-10 MED ORDER — SODIUM CHLORIDE 0.9% FLUSH
3.0000 mL | Freq: Two times a day (BID) | INTRAVENOUS | Status: DC
  Administered 2019-03-11 – 2019-03-19 (×16): 3 mL via INTRAVENOUS

## 2019-03-10 MED ORDER — ENOXAPARIN SODIUM 40 MG/0.4ML ~~LOC~~ SOLN
40.0000 mg | SUBCUTANEOUS | Status: DC
  Administered 2019-03-12 – 2019-03-18 (×7): 40 mg via SUBCUTANEOUS
  Filled 2019-03-10 (×9): qty 0.4

## 2019-03-10 MED ORDER — LACTATED RINGERS IV BOLUS (SEPSIS)
500.0000 mL | Freq: Once | INTRAVENOUS | Status: AC
  Administered 2019-03-10: 500 mL via INTRAVENOUS

## 2019-03-10 MED ORDER — SODIUM CHLORIDE 0.9 % IV BOLUS
1000.0000 mL | Freq: Once | INTRAVENOUS | Status: AC
  Administered 2019-03-10: 1000 mL via INTRAVENOUS

## 2019-03-10 MED ORDER — THIAMINE HCL 100 MG/ML IJ SOLN
100.0000 mg | Freq: Every day | INTRAMUSCULAR | Status: DC
  Administered 2019-03-10 – 2019-03-14 (×3): 100 mg via INTRAVENOUS
  Filled 2019-03-10 (×4): qty 2

## 2019-03-10 MED ORDER — DEXTROSE 50 % IV SOLN
25.0000 g | INTRAVENOUS | Status: AC
  Administered 2019-03-10: 25 g via INTRAVENOUS

## 2019-03-10 MED ORDER — DEXTROSE 50 % IV SOLN
INTRAVENOUS | Status: AC
  Filled 2019-03-10: qty 50

## 2019-03-10 MED ORDER — FOLIC ACID 1 MG PO TABS
1.0000 mg | ORAL_TABLET | Freq: Every day | ORAL | Status: DC
  Administered 2019-03-11 – 2019-03-19 (×9): 1 mg via ORAL
  Filled 2019-03-10 (×9): qty 1

## 2019-03-10 MED ORDER — NALOXONE HCL 0.4 MG/ML IJ SOLN
0.4000 mg | Freq: Once | INTRAMUSCULAR | Status: AC
  Administered 2019-03-10: 19:00:00 0.4 mg via INTRAVENOUS
  Filled 2019-03-10: qty 1

## 2019-03-10 MED ORDER — THIAMINE HCL 100 MG PO TABS
100.0000 mg | ORAL_TABLET | Freq: Every day | ORAL | Status: DC
  Administered 2019-03-12 – 2019-03-19 (×8): 100 mg via ORAL
  Filled 2019-03-10 (×8): qty 1

## 2019-03-10 MED ORDER — ADULT MULTIVITAMIN W/MINERALS CH
1.0000 | ORAL_TABLET | Freq: Every day | ORAL | Status: DC
  Administered 2019-03-11 – 2019-03-19 (×9): 1 via ORAL
  Filled 2019-03-10 (×9): qty 1

## 2019-03-10 MED ORDER — LORAZEPAM 2 MG/ML IJ SOLN
1.0000 mg | Freq: Once | INTRAMUSCULAR | Status: AC
  Administered 2019-03-10: 1 mg via INTRAVENOUS
  Filled 2019-03-10: qty 1

## 2019-03-10 MED ORDER — PIPERACILLIN-TAZOBACTAM 3.375 G IVPB 30 MIN
3.3750 g | Freq: Once | INTRAVENOUS | Status: AC
  Administered 2019-03-10: 3.375 g via INTRAVENOUS
  Filled 2019-03-10: qty 50

## 2019-03-10 MED ORDER — SODIUM CHLORIDE 0.9 % IV BOLUS
500.0000 mL | Freq: Once | INTRAVENOUS | Status: AC
  Administered 2019-03-10: 500 mL via INTRAVENOUS

## 2019-03-10 MED ORDER — DEXTROSE 50 % IV SOLN
INTRAVENOUS | Status: AC
  Administered 2019-03-10: 50 mL
  Filled 2019-03-10: qty 50

## 2019-03-10 MED ORDER — POTASSIUM CHLORIDE 10 MEQ/100ML IV SOLN
INTRAVENOUS | Status: AC
  Administered 2019-03-10: 10 meq via INTRAVENOUS
  Filled 2019-03-10: qty 100

## 2019-03-10 MED ORDER — ALBUTEROL SULFATE (2.5 MG/3ML) 0.083% IN NEBU: 2.5000 mg | INHALATION_SOLUTION | RESPIRATORY_TRACT | Status: DC | PRN

## 2019-03-10 MED ORDER — LACTATED RINGERS IV BOLUS (SEPSIS)
1000.0000 mL | Freq: Once | INTRAVENOUS | Status: AC
  Administered 2019-03-10: 1000 mL via INTRAVENOUS

## 2019-03-10 MED ORDER — MAGNESIUM SULFATE 2 GM/50ML IV SOLN
2.0000 g | Freq: Once | INTRAVENOUS | Status: AC
  Administered 2019-03-10: 2 g via INTRAVENOUS
  Filled 2019-03-10: qty 50

## 2019-03-10 MED ORDER — LORAZEPAM 1 MG PO TABS
1.0000 mg | ORAL_TABLET | ORAL | Status: AC | PRN
  Administered 2019-03-11: 1 mg via ORAL
  Administered 2019-03-11: 3 mg via ORAL
  Administered 2019-03-12: 2 mg via ORAL
  Filled 2019-03-10: qty 1
  Filled 2019-03-10: qty 3
  Filled 2019-03-10 (×2): qty 1

## 2019-03-10 MED ORDER — POTASSIUM CL IN DEXTROSE 5% 20 MEQ/L IV SOLN
20.0000 meq | INTRAVENOUS | Status: DC
  Administered 2019-03-10: 20 meq via INTRAVENOUS
  Filled 2019-03-10 (×2): qty 1000

## 2019-03-10 NOTE — H&P (Signed)
History and Physical    Michael [name] FAO:[id number] DOB: 1955-09-20 DOA: 03/10/2019  PCP: Helane Rima, MD   I have briefly reviewed patients previous medical reports in [facility].  Patient coming from: Home via EMS  Chief Complaint: Seizures, unresponsiveness.  HPI: Michael [name] is a 64 year old male, PMH of alcohol dependence, tobacco abuse, type II DM/IDDM, HTN, GERD, hepatitis B, pancreatitis, bipolar disorder, prior seizure disorder, recent hospital admission 02/26/2019 when he had presented with seizure, hyperglycemia, required intubation for airway protection, extubated the next day and then left the hospital [place], presented to the [facility] ED via EMS with history of witnessed seizure at home.  Patient unable to provide any history due to altered mental status.  History obtained by discussing with EDP and bedside RN.  Family reportedly called EMS because patient had a seizure but unable to provide details regarding type of seizure or duration.  When EMS arrived there CBGs found to be in the 40s, unable to get IV access and hence given IM glucagon.  On arrival to the ED CBGs in the 200s.  He was hypothermic in 94 F rectally and unresponsive, placed on Bair hugger.  ED Course: EDP suspected sepsis due to aspiration pneumonia and initiated sepsis protocol.  Patient was bolused with IV fluids per sepsis protocol and started on IV Zosyn.  CT head negative.  Covid 19 testing sent and pending.  Lactate elevated at 3.5.  Hypokalemia and hypomagnesemia being replaced.  As per RN, patient's mental status improved, would intermittently wake up, oriented to self and place but then drifts back to sleep and hence not staying awake consistently.  After a few hours, temperature normalized and was taken off the [facility].  He had recurrent hypoglycemia in the 20s requiring IV D50.  Review of Systems:  All other systems reviewed and apart from HPI, are  negative.  Past Medical History:  Diagnosis Date  . Anxiety   . Arthritis    "joints; shoulders; feet" (06/08/2016)  . Bipolar disorder ([place])   . Daily headache    "7:30 - 8:00 q night" (06/08/2016)  . Diabetic peripheral neuropathy ([place])   . GERD (gastroesophageal reflux disease)   . History of hepatitis B virus infection conferring immunity 03/22/2018  . Pancreatitis   . Schizophrenia ([place])   . Seizures ([place]) 01/2016 X 2  . Type II diabetes mellitus ([place])     Past Surgical History:  Procedure Laterality Date  . BALLOON DILATION  03/22/2011   Procedure: BALLOON DILATION;  Surgeon: Gatha Mayer, MD;  Location: [facility];  Service: Endoscopy;  Laterality: N/A;  . COLONOSCOPY N/A 05/22/2012   Procedure: COLONOSCOPY;  Surgeon: Gatha Mayer, MD;  Location: [place];  Service: Endoscopy;  Laterality: N/A;  . COLONOSCOPY W/ BIOPSIES AND POLYPECTOMY  09/12/11  . ESOPHAGOGASTRODUODENOSCOPY  03/22/2011   Procedure: ESOPHAGOGASTRODUODENOSCOPY (EGD);  Surgeon: Gatha Mayer, MD;  Location: [facility];  Service: Endoscopy;  Laterality: N/A;  egd with balloon   . EUS  04/20/2011   Procedure: UPPER ENDOSCOPIC ULTRASOUND (EUS) LINEAR;  Surgeon: Milus Banister, MD;  Location: [facility];  Service: Endoscopy;  Laterality: N/A;  . KNEE ARTHROSCOPY Left     Social History  reports that he has been smoking cigarettes. He has a 21.00 pack-year smoking history. He has never used smokeless tobacco. He reports current alcohol use of about 2.0 standard drinks of alcohol per week. He reports previous drug use. Drug: Marijuana.  Allergies  Allergen Reactions  . Ace Inhibitors Swelling and Other (See Comments)    Angioedema - unquestionable  . Losartan Swelling and Other (See Comments)    Pt presented with unquestionable angioedema on ACEIs (Angiotensin-converting enzyme (ACE) inhibitors) so aviod ARBs (Angiotensin receptor blockers), as well  . Tylenol [Acetaminophen] Other (See Comments)     "cannot have this" (contraindicated)    Family History  Problem Relation Age of Onset  . Hypertension Sister   . Diabetes Sister   . Heart disease Sister   . Colon cancer Neg Hx   . Stomach cancer Neg Hx   . Anesthesia problems Neg Hx   . Hypotension Neg Hx   . Pseudochol deficiency Neg Hx   . Malignant hyperthermia Neg Hx      Prior to Admission medications   Medication Sig Start Date End Date Taking? Authorizing Provider  ACCU-CHEK SOFTCLIX LANCETS lancets Use 3 times daily to check blood sugar. diag code E11.42. Insulin dependent 03/30/17  Yes Emokpae, Courage, MD  Alcohol Swabs (B-D SINGLE USE SWABS REGULAR) PADS Patient uses 6 times a day. Patient tests 3 times per day and administers insulin 3 times a day. ICD 10 code E11.42 03/30/17  Yes Emokpae, Courage, MD  ammonium lactate (LAC-HYDRIN) 12 % cream Apply topically as needed for dry skin. 10/10/17  Yes Gardiner Barefoot, DPM  ARIPiprazole (ABILIFY) 2 MG tablet Take 2 mg by mouth daily.   Yes [provider]  aspirin EC 81 MG tablet Take by mouth.   Yes [provider]  Blood Glucose Calibration (ACCU-CHEK AVIVA) SOLN 1 drop by In Vitro route as needed. diag code E08.10 insulin dependent. Use as device directs 03/30/17  Yes Emokpae, Courage, MD  Blood Glucose Monitoring Suppl (ACCU-CHEK AVIVA PLUS) w/Device KIT 1 kit by Does not apply route every morning. diag code E11.42. Insulin dependent 03/30/17  Yes Emokpae, Courage, MD  Buprenorphine HCl (BELBUCA) 900 MCG FILM Place 1 Film inside cheek every 12 (twelve) hours.    Yes [provider]  diclofenac sodium (VOLTAREN) 1 % GEL Apply 2 g topically 4 (four) times daily -  before meals and at bedtime. 01/05/17  Yes Rice, Resa Miner, MD  DULoxetine (CYMBALTA) 30 MG capsule Take 30 mg by mouth daily.   Yes [provider]  feeding supplement, GLUCERNA SHAKE, (GLUCERNA SHAKE) LIQD Take 237 mLs by mouth 3 (three) times daily between meals. 03/30/17  Yes Emokpae,  Courage, MD  folic acid (FOLVITE) 1 MG tablet Take 1 tablet (1 mg total) by mouth daily. 03/30/17  Yes Emokpae, Courage, MD  gabapentin (NEURONTIN) 300 MG capsule TAKE 3 CAPSULES BY MOUTH  EVERY MORNING AND  AFTERNOON. TAKE 4 CAPSULES  BY MOUTH AT NIGHT BEFORE  BED Patient taking differently: Take 300 mg by mouth 3 (three) times daily.  01/05/17  Yes Rice, Resa Miner, MD  glipiZIDE (GLUCOTROL) 10 MG tablet Take 10 mg by mouth daily before breakfast.    Yes [provider]  glucose blood (ACCU-CHEK AVIVA PLUS) test strip Use 3 times daily to check blood sugar. diag code E11.42. Insulin dependent 03/30/17  Yes Emokpae, Courage, MD  hydrOXYzine (ATARAX/VISTARIL) 25 MG tablet Take 25 mg by mouth 2 (two) times daily as needed for anxiety.    Yes [provider]  ibuprofen (ADVIL,MOTRIN) 800 MG tablet Take 800 mg by mouth 3 (three) times daily as needed for mild pain.  09/17/17  Yes [provider]  insulin aspart (NOVOLOG) 100 UNIT/ML FlexPen  Take 5 units before breakfast,   before lunch, and 5 units before dinner (5 units before each meal) Patient taking differently: Inject 5-10 Units into the skin 3 (three) times daily with meals. Take 5 units before breakfast and 10 units before lunch and dinner 03/30/17  Yes Emokpae, Courage, MD  LEVEMIR FLEXTOUCH 100 UNIT/ML Pen 4 units Twice a day Patient taking differently: Inject 15 Units into the skin 2 (two) times daily.  03/30/17  Yes Emokpae, Courage, MD  lipase/protease/amylase (CREON) 36000 UNITS CPEP capsule TAKE 1 CAPSULE BY MOUTH 3  TIMES DAILY BEFORE MEALS 01/05/17  Yes Rice, Resa Miner, MD  Multiple Vitamin (MULTIVITAMIN WITH MINERALS) TABS tablet Take 1 tablet by mouth daily. 03/30/17  Yes Emokpae, Courage, MD  omega-3 acid ethyl esters (LOVAZA) 1 g capsule Take 1 capsule (1 g total) by mouth daily. 10/15/16  Yes Sid Falcon, MD  oxyCODONE-acetaminophen (PERCOCET) 10-325 MG tablet Take 1 tablet by mouth every 8 (eight) hours as  needed for pain.   Yes [provider]  pantoprazole (PROTONIX) 40 MG tablet Take 1 tablet (40 mg total) by mouth daily. 03/30/17  Yes Emokpae, Courage, MD  pravastatin (PRAVACHOL) 10 MG tablet Take 1 tablet (10 mg total) by mouth daily at 6 PM. 01/05/17  Yes Rice, Resa Miner, MD  QUEtiapine Fumarate (SEROQUEL XR) 150 MG 24 hr tablet Take 150 mg by mouth at bedtime.  07/20/17  Yes [provider]  thiamine (VITAMIN B-1) 100 MG tablet Take 1 tablet (100 mg total) by mouth daily. 03/30/17  Yes Emokpae, Courage, MD  naloxone [facility]) nasal spray 4 mg/0.1 mL as needed. 07/13/17   [provider]    Physical Exam: Vitals:   03/10/19 1717 03/10/19 1730 03/10/19 1747 03/10/19 1813  BP:  (!) 90/53 (!) 90/57 99/69  Pulse: 77 75 71 72  Resp: (!) 28 (!) 22 (!) 22 19  Temp:    97.7 F (36.5 C)  TempSrc:    Oral  SpO2: 100% 100% 100% 100%      Constitutional: Middle-age male, small built, frail and thinly nourished, chronically ill looking lying comfortably supine in bed without distress.  Unable to evaluate oral mucosa/uncooperative when shots his mouth. Eyes: PERTLA, lids and conjunctivae normal.  Bilateral cataracts and arcus analysis. ENMT: Unable to examine due to lack of cooperation. Neck: supple, no masses, no thyromegaly Respiratory: Diminished breath sounds in the bases but otherwise clear to auscultation.  No increased work of breathing.  No wheezing, rhonchi or crackles appreciated. Cardiovascular: S1 & S2 heard, regular rate and rhythm, no murmurs / rubs / gallops. No extremity edema. 2+ pedal pulses. No carotid bruits.  Abdomen: No distension, no tenderness, no masses palpated. No hepatosplenomegaly. Bowel sounds normal.  Smear of soft [place] stool stools noted on his bottom. Musculoskeletal: no clubbing / cyanosis. No joint deformity upper and lower extremities.  Moves all extremities spontaneously and symmetrically. Normal muscle tone.  Skin: no rashes, lesions,  ulcers. No induration Neurologic: Intermittently opens eyes briefly, mumbles and drifts back.  To touch may open eyes but not following instructions currently.  No obvious cranial nerve deficits or facial asymmetry. Psychiatric: Impaired judgment and insight.  Mood cannot be assessed.    Labs on Admission: I have personally reviewed following labs and imaging studies  CBC: Recent Labs  Lab 03/10/19 1350  WBC 12.3*  NEUTROABS 10.2*  HGB 12.5*  HCT 35.8*  MCV 94.2  PLT 076    Basic Metabolic Panel: Recent  Labs  Lab 03/10/19 1350  NA 145  K 2.9*  CL 105  CO2 28  GLUCOSE 58*  BUN 6*  CREATININE 0.60*  CALCIUM 8.2*  MG 1.7    Liver Function Tests: Recent Labs  Lab 03/10/19 1350  AST 39  ALT 37  ALKPHOS 257*  BILITOT 1.4*  PROT 4.7*  ALBUMIN 2.0*     Radiological Exams on Admission: CT Head Wo Contrast  Result Date: 03/10/2019 CLINICAL DATA:  Possible seizure today. EXAM: CT HEAD WITHOUT CONTRAST TECHNIQUE: Contiguous axial images were obtained from the base of the skull through the vertex without intravenous contrast. COMPARISON:  Head CT scan 02/26/2019.  Brain MRI 03/26/2017. FINDINGS: Brain: No evidence of acute infarction, hemorrhage, hydrocephalus, extra-axial collection or mass lesion/mass effect. Atrophy and chronic microvascular ischemic change noted. Vascular: Atherosclerosis. Skull: Intact.  No focal lesion. Sinuses/Orbits: Thickening of the walls of the left maxillary sinus consistent with chronic sinus disease noted. The patient is status post ethmoidectomy. No mucosal thickening identified. Other: None. IMPRESSION: No acute abnormality. Atrophy and chronic microvascular ischemic change. Electronically Signed   By: Inge Rise M.D.   On: 03/10/2019 15:19   DG Chest Port 1 View  Result Date: 03/10/2019 CLINICAL DATA:  Altered mental status, seizure, possible aspiration. EXAM: PORTABLE CHEST 1 VIEW COMPARISON:  Chest radiograph 02/27/2019 FINDINGS:  Heart size within normal limits. Airspace disease within the right lung base persists, although has improved since prior examination 02/27/2019. The left lung remains clear. No evidence of pleural effusion or pneumothorax. Redemonstrated displaced distal right clavicle fracture. Thoracic spondylosis. IMPRESSION: Persistent although improved airspace disease within the right lung base as compared to chest radiograph 02/27/2019. The left lung remains clear. Redemonstrated displaced distal right clavicle fracture. Electronically Signed   By: Kellie Simmering DO   On: 03/10/2019 14:52    EKG: Independently reviewed.  Sinus rhythm at 68 bpm, normal axis, no acute changes.  QTc prolonged: 646.  Assessment/Plan Principal Problem:   Acute metabolic encephalopathy Active Problems:   Type 2 diabetes mellitus with peripheral neuropathy (HCC)   Chronic alcoholic pancreatitis (HCC)   Protein-calorie malnutrition, severe   Tobacco abuse   Hypoglycemia   Alcohol use disorder, severe, dependence ([place])   Seizure ([place])   Hypothermia   Sepsis ([place])     1. Acute metabolic encephalopathy: Likely multifactorial related to seizure and postictal state, recurrent severe hypoglycemia-unclear how long he was hypoglycemic prior to EMS arrival, possible sepsis although felt less likely because and home polypharmacy.  CT head without acute findings.  Mental status seemed to improve somewhat compared to initial arrival, continue to monitor.  Monitor closely in stepdown unit.  Seizure precautions and monitor for seizures.  Check ammonia level, ABG for CO2 retention given recent intubation for same. NPO until safe to feed.  Also trial of a dose of Narcan given list of opioids and home meds as well as Narcan listed.  Trend troponins. 2. Type II DM/IDDM with recurrent severe hypoglycemia: May be multifactorial related to insulins, long-acting oral hypoglycemics, poor oral intake.  Hold all antidiabetic medications.  Has had glucagon  by EMS and IV D50 in ED.  Since he has received IV thiamine already, placed on D5 infusion and monitor CBGs closely. 3. Hypothermia: Could be multifactorial related to severe hypoglycemia and sepsis.  Seems to have resolved.  Check random cortisol.  Monitor. 4. Seizure: Again multiple precipitants including hypoglycemia, alcohol abuse/withdrawal, sepsis.  Does not seem to be on AEDs.  Received Ativan  in ED.  Seizure precautions.  Monitor for seizures.  EEG.  Requested EDP to consult neuro hospitalist.  Likely not a candidate for long-term AEDs due to alcohol-related seizures. 5. Hypokalemia and hypomagnesemia: Replace IV and follow. 6. Sepsis: Present on admission.?  Related to aspiration pneumonia.  Chest x-ray looks better than before.  Continue IV Zosyn for now.  Treated with IV fluids per sepsis protocol. 7. Alcohol dependence: Unclear how severe this problem is currently.  Blood alcohol level negative. CIWA protocol including IV thiamine. 8. Tobacco abuse: Start nicotine patch when patient consistently alert. 9. Essential hypertension: Currently soft blood pressures.  Continue IV fluid resuscitation. 10. GERD: IV PPI. 11. Elevated lactate: Continue IV fluids and antibiotics. 12. Prolonged QTC: EKG showed QTC 646 ms.  Replace potassium and magnesium, avoid QT prolonging medicines and follow EKG in a.m.   DVT prophylaxis: Subcutaneous Lovenox Code Status: Full code by default Family Communication: None at bedside Disposition Plan: DC home when medically improved and stable Consults called: Requested EDP to consult Neuro Hospitalist. Admission status: Inpatient, progressive care unit  Severity of Illness: The appropriate patient status for this patient is INPATIENT. Inpatient status is judged to be reasonable and necessary in order to provide the required intensity of service to ensure the patient's safety. The patient's presenting symptoms, physical exam findings, and initial radiographic and  laboratory data in the context of their chronic comorbidities is felt to place them at high risk for further clinical deterioration. Furthermore, it is not anticipated that the patient will be medically stable for discharge from the hospital within 2 midnights of admission. The following factors support the patient status of inpatient.   " The patient's presenting symptoms include unresponsiveness after seizure activity. " The worrisome physical exam findings include presented with hypothermia, less responsive. " The initial radiographic and laboratory data are worrisome because of hypoglycemia, lactic acidosis. " The chronic co-morbidities include DM, HTN, alcohol dependence, seizures.   * I certify that at the point of admission it is my clinical judgment that the patient will require inpatient hospital care spanning beyond 2 midnights from the point of admission due to high intensity of service, high risk for further deterioration and high frequency of surveillance required.Vernell Leep MD [facility]  To contact the attending provider between 7A-7P or the covering provider during after hours 7P-7A, please log into the web site [email or web address] and access using universal [place] password for that web site. If you do not have the password, please call the hospital operator.  03/10/2019, 6:23 PM

## 2019-03-10 NOTE — Progress Notes (Signed)
Spoke with patient's sister, Earlie Server at patient request for an update.  Gave sister personal room number at patient request to call.

## 2019-03-10 NOTE — Progress Notes (Signed)
Patient refuses condom cath despite wet sheets. Patient allowed staff to give fresh sheets and is in agreement to use the urinal.  Will attempt to get urine once patient uses urinal.

## 2019-03-10 NOTE — Progress Notes (Signed)
Patient refused 2200 CBG check- last one at 2106 was 131.  Will attempt to recheck at [facility]

## 2019-03-10 NOTE — Progress Notes (Signed)
MD paged for ABG results as requested.  Narcan given. Patient is now more awake at this time.  Requesting food and to go home.

## 2019-03-10 NOTE — ED Triage Notes (Signed)
Per EMS: Pt here from home with family with seizure like activity and hypoglycemia (CBG 44). Family was unable to give details regarding the seizure or the length of time it lasted.  EMS reports pt aspirated, they suctioned pt's airway and placed pt on [place] 8L oxygen. EMS administered Glucagon IM PTA - pts CBG  206.    EMS Vitals:   BP 135/80 HR 77 NSR on monitor

## 2019-03-10 NOTE — ED Notes (Signed)
Rectal temp 98.3, bairhugger removed.

## 2019-03-10 NOTE — Progress Notes (Signed)
Hypoglycemic Event  CBG: 30  Treatment: 25g D50  Symptoms: lethargic  Follow-up CBG: Time: 2106 CBG Result:131  Possible Reasons for Event: NPO  Comments/MD notified:    [place]

## 2019-03-10 NOTE — Progress Notes (Signed)
Pharmacy Antibiotic Note  Michael [name] is a 64 y.o. male admitted on 03/10/2019 with sepsis.  Pharmacy has been consulted for Zosyn dosing.  Plan: Zosyn 3.375g IV q8h (4 hour infusion).  Monitor renal function and C&S  Temp (24hrs), Avg:96.7 F (35.9 C), Min:94 F (34.4 C), Max:98.3 F (36.8 C)  Recent Labs  Lab 03/10/19 1350 03/10/19 1650  WBC 12.3*  --   CREATININE 0.60*  --   LATICACIDVEN 3.5* 2.7*    Estimated Creatinine Clearance: 65.7 mL/min (A) (by C-G formula based on SCr of 0.6 mg/dL (L)).    Allergies  Allergen Reactions  . Ace Inhibitors Swelling and Other (See Comments)    Angioedema - unquestionable  . Losartan Swelling and Other (See Comments)    Pt presented with unquestionable angioedema on ACEIs (Angiotensin-converting enzyme (ACE) inhibitors) so aviod ARBs (Angiotensin receptor blockers), as well  . Tylenol [Acetaminophen] Other (See Comments)    "cannot have this" (contraindicated)    Antimicrobials this admission: Zosyn 2/15 >>  Thank you for allowing pharmacy to be a part of this patient's care.  Alanda Slim, PharmD, [facility] Clinical Pharmacist Please see AMION for all Pharmacists' Contact Phone Numbers 03/10/2019, 7:00 PM

## 2019-03-10 NOTE — Consult Note (Signed)
NEURO HOSPITALIST CONSULT NOTE   Requestig physician: Dr. Algis Liming  Reason for Consult: Seizure  History obtained from:  Patient and Chart     HPI:                                                                                                                                          Michael [name] is an 64 y.o. male with a history of EtOH dependence, DM2, hepatitis B, pancreatitis and prior seizure disorder who presents after a witnessed seizure at home. His CBG was in the 40's on EMS arrival, then in 200s in the ED after he was given glucagon by EMS. He was altered on arrival. The family was unable to provide a description of the semiology of the seizure. IN the ED, sepsis due to aspiration PNA was suspected and sepsis protocol was initiated. Electrolyte derangements were noted. He has had recurrent hypoglycemia in the 20s requiring IV D50. Of note, he was recently admitted on 2/3 after presenting with a seizure.   The patient is a poor historian. In this context he states "I take my pain meds" but that "there are a lot of meds that I don't take". He is unable to specify what his medications are, who prescribes them or whether his medication list in Epic is up to date.   CT head: No acute abnormality. Atrophy and chronic microvascular ischemic change.  Labs: Ammonia elevated at 60 AST and ALT normal Mg normal Serum Ca unremarkable in context of low albumin Lactate elevated at 2.7  Past Medical History:  Diagnosis Date  . Anxiety   . Arthritis    "joints; shoulders; feet" (06/08/2016)  . Bipolar disorder ([place])   . Daily headache    "7:30 - 8:00 q night" (06/08/2016)  . Diabetic peripheral neuropathy ([place])   . GERD (gastroesophageal reflux disease)   . History of hepatitis B virus infection conferring immunity 03/22/2018  . Pancreatitis   . Schizophrenia ([place])   . Seizures ([place]) 01/2016 X 2  . Type II diabetes mellitus ([place])     Past Surgical History:   Procedure Laterality Date  . BALLOON DILATION  03/22/2011   Procedure: BALLOON DILATION;  Surgeon: Gatha Mayer, MD;  Location: [facility];  Service: Endoscopy;  Laterality: N/A;  . COLONOSCOPY N/A 05/22/2012   Procedure: COLONOSCOPY;  Surgeon: Gatha Mayer, MD;  Location: [facility];  Service: Endoscopy;  Laterality: N/A;  . COLONOSCOPY W/ BIOPSIES AND POLYPECTOMY  09/12/11  . ESOPHAGOGASTRODUODENOSCOPY  03/22/2011   Procedure: ESOPHAGOGASTRODUODENOSCOPY (EGD);  Surgeon: Gatha Mayer, MD;  Location: [facility];  Service: Endoscopy;  Laterality: N/A;  egd with balloon   . EUS  04/20/2011   Procedure: UPPER ENDOSCOPIC ULTRASOUND (EUS) LINEAR;  Surgeon: Milus Banister, MD;  Location: [facility];  Service: Endoscopy;  Laterality: N/A;  . KNEE ARTHROSCOPY Left     Family History  Problem Relation Age of Onset  . Hypertension Sister   . Diabetes Sister   . Heart disease Sister   . Colon cancer Neg Hx   . Stomach cancer Neg Hx   . Anesthesia problems Neg Hx   . Hypotension Neg Hx   . Pseudochol deficiency Neg Hx   . Malignant hyperthermia Neg Hx               Social History:  reports that he has been smoking cigarettes. He has a 21.00 pack-year smoking history. He has never used smokeless tobacco. He reports current alcohol use of about 2.0 standard drinks of alcohol per week. He reports previous drug use. Drug: Marijuana.  Allergies  Allergen Reactions  . Ace Inhibitors Swelling and Other (See Comments)    Angioedema - unquestionable  . Losartan Swelling and Other (See Comments)    Pt presented with unquestionable angioedema on ACEIs (Angiotensin-converting enzyme (ACE) inhibitors) so aviod ARBs (Angiotensin receptor blockers), as well  . Tylenol [Acetaminophen] Other (See Comments)    "cannot have this" (contraindicated)    HOME MEDICATIONS:                                                                                                                      No current  facility-administered medications on file prior to encounter.   Current Outpatient Medications on File Prior to Encounter  Medication Sig Dispense Refill  . ACCU-CHEK SOFTCLIX LANCETS lancets Use 3 times daily to check blood sugar. diag code E11.42. Insulin dependent 100 each 5  . Alcohol Swabs (B-D SINGLE USE SWABS REGULAR) PADS Patient uses 6 times a day. Patient tests 3 times per day and administers insulin 3 times a day. ICD 10 code E11.42 200 each 5  . ammonium lactate (LAC-HYDRIN) 12 % cream Apply topically as needed for dry skin. 385 g 0  . ARIPiprazole (ABILIFY) 2 MG tablet Take 2 mg by mouth daily.    Marland Kitchen aspirin EC 81 MG tablet Take by mouth.    . Blood Glucose Calibration (ACCU-CHEK AVIVA) SOLN 1 drop by In Vitro route as needed. diag code E08.10 insulin dependent. Use as device directs 1 each 4  . Blood Glucose Monitoring Suppl (ACCU-CHEK AVIVA PLUS) w/Device KIT 1 kit by Does not apply route every morning. diag code E11.42. Insulin dependent 1 kit 0  . Buprenorphine HCl (BELBUCA) 900 MCG FILM Place 1 Film inside cheek every 12 (twelve) hours.     . diclofenac sodium (VOLTAREN) 1 % GEL Apply 2 g topically 4 (four) times daily -  before meals and at bedtime. 100 g 5  . DULoxetine (CYMBALTA) 30 MG capsule Take 30 mg by mouth daily.    . feeding supplement, GLUCERNA SHAKE, (GLUCERNA SHAKE) LIQD Take 237 mLs by mouth 3 (three) times daily between meals. 90 Can 2  . folic acid (FOLVITE) 1  MG tablet Take 1 tablet (1 mg total) by mouth daily. 30 tablet 5  . gabapentin (NEURONTIN) 300 MG capsule TAKE 3 CAPSULES BY MOUTH  EVERY MORNING AND  AFTERNOON. TAKE 4 CAPSULES  BY MOUTH AT NIGHT BEFORE  BED (Patient taking differently: Take 300 mg by mouth 3 (three) times daily. ) 300 capsule 5  . glipiZIDE (GLUCOTROL) 10 MG tablet Take 10 mg by mouth daily before breakfast.     . glucose blood (ACCU-CHEK AVIVA PLUS) test strip Use 3 times daily to check blood sugar. diag code E11.42. Insulin dependent 300  each 12  . hydrOXYzine (ATARAX/VISTARIL) 25 MG tablet Take 25 mg by mouth 2 (two) times daily as needed for anxiety.     Marland Kitchen ibuprofen (ADVIL,MOTRIN) 800 MG tablet Take 800 mg by mouth 3 (three) times daily as needed for mild pain.     Marland Kitchen insulin aspart (NOVOLOG) 100 UNIT/ML FlexPen Take 5 units before breakfast,   before lunch, and 5 units before dinner (5 units before each meal) (Patient taking differently: Inject 5-10 Units into the skin 3 (three) times daily with meals. Take 5 units before breakfast and 10 units before lunch and dinner) 3 mL 3  . LEVEMIR FLEXTOUCH 100 UNIT/ML Pen 4 units Twice a day (Patient taking differently: Inject 15 Units into the skin 2 (two) times daily. ) 15 mL 0  . lipase/protease/amylase (CREON) 36000 UNITS CPEP capsule TAKE 1 CAPSULE BY MOUTH 3  TIMES DAILY BEFORE MEALS 300 capsule 1  . Multiple Vitamin (MULTIVITAMIN WITH MINERALS) TABS tablet Take 1 tablet by mouth daily. 30 tablet 4  . omega-3 acid ethyl esters (LOVAZA) 1 g capsule Take 1 capsule (1 g total) by mouth daily. 30 capsule 3  . oxyCODONE-acetaminophen (PERCOCET) 10-325 MG tablet Take 1 tablet by mouth every 8 (eight) hours as needed for pain.    . pantoprazole (PROTONIX) 40 MG tablet Take 1 tablet (40 mg total) by mouth daily. 30 tablet 5  . pravastatin (PRAVACHOL) 10 MG tablet Take 1 tablet (10 mg total) by mouth daily at 6 PM. 30 tablet 5  . QUEtiapine Fumarate (SEROQUEL XR) 150 MG 24 hr tablet Take 150 mg by mouth at bedtime.     . thiamine (VITAMIN B-1) 100 MG tablet Take 1 tablet (100 mg total) by mouth daily. 30 tablet 5  . naloxone (NARCAN) nasal spray 4 mg/0.1 mL as needed.       ROS:                                                                                                                                       The patient is non-compliant with detailed questioning.    Blood pressure 99/69, pulse 72, temperature 97.7 F (36.5 C), temperature source Oral, resp. rate 19, SpO2 100  %.   General Examination:  Physical Exam  General: Cachectic HEENT-  [place]/AT   Lungs- Respirations unlabored Extremities-  Dry skin. No pallor   Neurological Examination Mental Status: Awake and alert. Non-cooperative with detailed questioning outside of limited history as documented in the HPI. Speech fluent with intact comprehension of commands and questions.  Cranial Nerves: II: Visual fields grossly normal with no extinction to DSS. PERRL  III,IV, VI: No ptosis. Exotropia noted (patient states that he has a "lazy eye" chronically) VII: Smile symmetric VIII: hearing intact to voice IX,X: No hypophonia XI: Symmetric XII: midline tongue extension Motor: Right : Upper extremity   5/5    Left:     Upper extremity   5/5  Lower extremity   5/5     Lower extremity   5/5 Diffuse cachexia with decreased muscle bulk is noted Sensory: Light touch intact throughout, bilaterally Deep Tendon Reflexes: 2+ bilateral upper extremities. Noncooperative to lower extremity testing Cerebellar: No ataxia with FNF bilaterally Gait: Deferred   Lab Results: Basic Metabolic Panel: Recent Labs  Lab 03/10/19 1350  NA 145  K 2.9*  CL 105  CO2 28  GLUCOSE 58*  BUN 6*  CREATININE 0.60*  CALCIUM 8.2*  MG 1.7    CBC: Recent Labs  Lab 03/10/19 1350  WBC 12.3*  NEUTROABS 10.2*  HGB 12.5*  HCT 35.8*  MCV 94.2  PLT 190    Cardiac Enzymes: No results for input(s): CKTOTAL, CKMB, CKMBINDEX, TROPONINI in the last 168 hours.  Lipid Panel: No results for input(s): CHOL, TRIG, HDL, CHOLHDL, VLDL, LDLCALC in the last 168 hours.  Imaging: CT Head Wo Contrast  Result Date: 03/10/2019 CLINICAL DATA:  Possible seizure today. EXAM: CT HEAD WITHOUT CONTRAST TECHNIQUE: Contiguous axial images were obtained from the base of the skull through the vertex without intravenous contrast. COMPARISON:  Head  CT scan 02/26/2019.  Brain MRI 03/26/2017. FINDINGS: Brain: No evidence of acute infarction, hemorrhage, hydrocephalus, extra-axial collection or mass lesion/mass effect. Atrophy and chronic microvascular ischemic change noted. Vascular: Atherosclerosis. Skull: Intact.  No focal lesion. Sinuses/Orbits: Thickening of the walls of the left maxillary sinus consistent with chronic sinus disease noted. The patient is status post ethmoidectomy. No mucosal thickening identified. Other: None. IMPRESSION: No acute abnormality. Atrophy and chronic microvascular ischemic change. Electronically Signed   By: Inge Rise M.D.   On: 03/10/2019 15:19   DG Chest Port 1 View  Result Date: 03/10/2019 CLINICAL DATA:  Altered mental status, seizure, possible aspiration. EXAM: PORTABLE CHEST 1 VIEW COMPARISON:  Chest radiograph 02/27/2019 FINDINGS: Heart size within normal limits. Airspace disease within the right lung base persists, although has improved since prior examination 02/27/2019. The left lung remains clear. No evidence of pleural effusion or pneumothorax. Redemonstrated displaced distal right clavicle fracture. Thoracic spondylosis. IMPRESSION: Persistent although improved airspace disease within the right lung base as compared to chest radiograph 02/27/2019. The left lung remains clear. Redemonstrated displaced distal right clavicle fracture. Electronically Signed   By: Kellie Simmering DO   On: 03/10/2019 14:52    Assessment: 64 year old male presenting with witnessed seizure at home in the context of hypoglycemia and possible aspiration PNA  1. The patient states that he is not withdrawing from alcohol. He endorses having his last drink 2 months ago. 2. Seizure most likely precipitated by hypoglycemia 3. No clinical seizure activity on exam.  4. No seizure medication listed on Epic.   Recommendations: 1. Seizure was most likely provoked by hypoglycemia this admission, whereas at the time of  his last  admission on 2/3, his seizure was most likely precipitated by hyperglycemia. He has a history of 2 prior seizures in January of 2018, but there are no notes in Epic documenting the specifics of those occurrences. Although the two most recent seizures have been provoked, he may have a lowered seizure threshold due to recurrence over the short ime span of 12 days. Would start Keppra 500 mg PO BID and obtain an EEG as well as outpatient Neurology follow up.    2. CIWA protocol.  3. Per [facility] statutes, patients with seizures are not allowed to drive until  they have been seizure-free for six months. Use caution when using heavy equipment or power tools. Avoid working on ladders or at heights. Take showers instead of baths. Ensure the water temperature is not too high on the home water heater. Do not go swimming alone. When caring for infants or small children, sit down when holding, feeding, or changing them to minimize risk of injury to the child in the event you have a seizure. Also, Maintain good sleep hygiene. Avoid alcohol.   Electronically signed: Dr. Kerney Elbe 03/10/2019, 7:43 PM

## 2019-03-10 NOTE — ED Notes (Signed)
Pt refusing lab draws.

## 2019-03-10 NOTE — Progress Notes (Signed)
Low bed ordered for patient by dayshift-pt refuses to change beds at this time.

## 2019-03-10 NOTE — ED Provider Notes (Signed)
[place] EMERGENCY DEPARTMENT Provider Note   CSN: [id number] Arrival date & time: 03/10/19  1314     History Chief Complaint  Patient presents with  . Hypoglycemia  . Seizures    Michael [name] is a 64 y.o. male.  64 yo male with PMH of T2DM, HTN, alcohol use disorder with chronic pancreatitis, wernicke's encephalopathy who presented to [facility] ED via EMS for altered mental status. Mental status barring reliable history and per EMS, family was not coopertive. LEVEL 5 CAVEAT  History primarily obtained from chart review and EMS. EMS notes that on arrival at the scene, CBG was found to be in the low 40s. Improved into the 200s after glucogon. Appears that he was recently admitted with a blood sugar >1200 at which time he was intubated. Appears that he has poor PCP follow up.      Past Medical History:  Diagnosis Date  . Anxiety   . Arthritis    "joints; shoulders; feet" (06/08/2016)  . Bipolar disorder ([place])   . Daily headache    "7:30 - 8:00 q night" (06/08/2016)  . Diabetic peripheral neuropathy ([place])   . GERD (gastroesophageal reflux disease)   . History of hepatitis B virus infection conferring immunity 03/22/2018  . Pancreatitis   . Schizophrenia ([place])   . Seizures ([place]) 01/2016 X 2  . Type II diabetes mellitus [facility])     Patient Active Problem List   Diagnosis Date Noted  . Seizure ([place]) 02/26/2019  . History of hepatitis B virus infection conferring immunity 03/22/2018  . Alcohol use disorder, severe, dependence ([place]) 12/29/2017  . Malnutrition of moderate degree 03/30/2017  . Hypoglycemia 03/26/2017  . Acute metabolic encephalopathy 25/95/6387  . Other constipation 10/16/2016  . Recurrent falls 07/07/2016  . Wernicke's encephalopathy   . Altered mental status 06/09/2016  . Bipolar disorder ([place]) 06/01/2016  . Dry skin 01/18/2016  . Peripheral vascular disease ([place]) 11/15/2015  . Hyperglycemia 09/02/2015  . Tobacco abuse 09/02/2015   . Tricompartment osteoarthritis of both knees 06/15/2015  . Vitamin D deficiency 03/12/2015  . Hyperlipidemia 02/17/2015  . Protein-calorie malnutrition, severe 11/18/2014  . Alcohol use   . Cervical arthritis 01/01/2014  . Health care maintenance 08/25/2011  . Chronic alcoholic pancreatitis ([place]) 04/20/2011  . Type 2 diabetes mellitus with peripheral neuropathy ([place]) 12/09/2010  . HTN (hypertension) 12/09/2010    Past Surgical History:  Procedure Laterality Date  . BALLOON DILATION  03/22/2011   Procedure: BALLOON DILATION;  Surgeon: Gatha Mayer, MD;  Location: [facility] ENDOSCOPY;  Service: Endoscopy;  Laterality: N/A;  . COLONOSCOPY N/A 05/22/2012   Procedure: COLONOSCOPY;  Surgeon: Gatha Mayer, MD;  Location: [place];  Service: Endoscopy;  Laterality: N/A;  . COLONOSCOPY W/ BIOPSIES AND POLYPECTOMY  09/12/11  . ESOPHAGOGASTRODUODENOSCOPY  03/22/2011   Procedure: ESOPHAGOGASTRODUODENOSCOPY (EGD);  Surgeon: Gatha Mayer, MD;  Location: [facility];  Service: Endoscopy;  Laterality: N/A;  egd with balloon   . EUS  04/20/2011   Procedure: UPPER ENDOSCOPIC ULTRASOUND (EUS) LINEAR;  Surgeon: Milus Banister, MD;  Location: [facility] ENDOSCOPY;  Service: Endoscopy;  Laterality: N/A;  . KNEE ARTHROSCOPY Left        Family History  Problem Relation Age of Onset  . Hypertension Sister   . Diabetes Sister   . Heart disease Sister   . Colon cancer Neg Hx   . Stomach cancer Neg Hx   . Anesthesia problems Neg Hx   . Hypotension Neg Hx   .  Pseudochol deficiency Neg Hx   . Malignant hyperthermia Neg Hx     Social History   Tobacco Use  . Smoking status: Current Every Day Smoker    Packs/day: 0.50    Years: 42.00    Pack years: 21.00    Types: Cigarettes  . Smokeless tobacco: Never Used  . Tobacco comment: Sometimes less.  Substance Use Topics  . Alcohol use: Yes    Alcohol/week: 2.0 standard drinks    Types: 2 Cans of beer per week  . Drug use: Not Currently    Types:  Marijuana    Comment: Sober for 2 years    Home Medications Prior to Admission medications   Medication Sig Start Date End Date Taking? Authorizing Provider  ACCU-CHEK SOFTCLIX LANCETS lancets Use 3 times daily to check blood sugar. diag code E11.42. Insulin dependent 03/30/17  Yes Emokpae, Courage, MD  Alcohol Swabs (B-D SINGLE USE SWABS REGULAR) PADS Patient uses 6 times a day. Patient tests 3 times per day and administers insulin 3 times a day. ICD 10 code E11.42 03/30/17  Yes Emokpae, Courage, MD  ammonium lactate (LAC-HYDRIN) 12 % cream Apply topically as needed for dry skin. 10/10/17  Yes Gardiner Barefoot, DPM  ARIPiprazole (ABILIFY) 2 MG tablet Take 2 mg by mouth daily.   Yes [provider]  aspirin EC 81 MG tablet Take by mouth.   Yes [provider]  Blood Glucose Calibration (ACCU-CHEK AVIVA) SOLN 1 drop by In Vitro route as needed. diag code E08.10 insulin dependent. Use as device directs 03/30/17  Yes Emokpae, Courage, MD  Blood Glucose Monitoring Suppl (ACCU-CHEK AVIVA PLUS) w/Device KIT 1 kit by Does not apply route every morning. diag code E11.42. Insulin dependent 03/30/17  Yes Emokpae, Courage, MD  Buprenorphine HCl (BELBUCA) 900 MCG FILM Place 1 Film inside cheek every 12 (twelve) hours.    Yes [provider]  diclofenac sodium (VOLTAREN) 1 % GEL Apply 2 g topically 4 (four) times daily -  before meals and at bedtime. 01/05/17  Yes Rice, Resa Miner, MD  DULoxetine (CYMBALTA) 30 MG capsule Take 30 mg by mouth daily.   Yes [provider]  feeding supplement, GLUCERNA SHAKE, (GLUCERNA SHAKE) LIQD Take 237 mLs by mouth 3 (three) times daily between meals. 03/30/17  Yes Emokpae, Courage, MD  folic acid (FOLVITE) 1 MG tablet Take 1 tablet (1 mg total) by mouth daily. 03/30/17  Yes Emokpae, Courage, MD  gabapentin (NEURONTIN) 300 MG capsule TAKE 3 CAPSULES BY MOUTH  EVERY MORNING AND  AFTERNOON. TAKE 4 CAPSULES  BY MOUTH AT NIGHT BEFORE  BED Patient taking  differently: Take 300 mg by mouth 3 (three) times daily.  01/05/17  Yes Rice, Resa Miner, MD  glipiZIDE (GLUCOTROL) 10 MG tablet Take 10 mg by mouth daily before breakfast.    Yes [provider]  glucose blood (ACCU-CHEK AVIVA PLUS) test strip Use 3 times daily to check blood sugar. diag code E11.42. Insulin dependent 03/30/17  Yes Emokpae, Courage, MD  hydrOXYzine (ATARAX/VISTARIL) 25 MG tablet Take 25 mg by mouth 2 (two) times daily as needed for anxiety.    Yes [provider]  ibuprofen (ADVIL,MOTRIN) 800 MG tablet Take 800 mg by mouth 3 (three) times daily as needed for mild pain.  09/17/17  Yes [provider]  insulin aspart (NOVOLOG) 100 UNIT/ML FlexPen Take 5 units before breakfast,   before lunch, and 5 units before dinner (5 units before each meal) Patient taking differently: Inject  5-10 Units into the skin 3 (three) times daily with meals. Take 5 units before breakfast and 10 units before lunch and dinner 03/30/17  Yes Emokpae, Courage, MD  LEVEMIR FLEXTOUCH 100 UNIT/ML Pen 4 units Twice a day Patient taking differently: Inject 15 Units into the skin 2 (two) times daily.  03/30/17  Yes Emokpae, Courage, MD  lipase/protease/amylase (CREON) 36000 UNITS CPEP capsule TAKE 1 CAPSULE BY MOUTH 3  TIMES DAILY BEFORE MEALS 01/05/17  Yes [place], Resa Miner, MD  Multiple Vitamin (MULTIVITAMIN WITH MINERALS) TABS tablet Take 1 tablet by mouth daily. 03/30/17  Yes Emokpae, Courage, MD  omega-3 acid ethyl esters (LOVAZA) 1 g capsule Take 1 capsule (1 g total) by mouth daily. 10/15/16  Yes Sid Falcon, MD  oxyCODONE-acetaminophen (PERCOCET) 10-325 MG tablet Take 1 tablet by mouth every 8 (eight) hours as needed for pain.   Yes [provider]  pantoprazole (PROTONIX) 40 MG tablet Take 1 tablet (40 mg total) by mouth daily. 03/30/17  Yes Emokpae, Courage, MD  pravastatin (PRAVACHOL) 10 MG tablet Take 1 tablet (10 mg total) by mouth daily at 6 PM. 01/05/17  Yes Rice,  Resa Miner, MD  QUEtiapine Fumarate (SEROQUEL XR) 150 MG 24 hr tablet Take 150 mg by mouth at bedtime.  07/20/17  Yes [provider]  thiamine (VITAMIN B-1) 100 MG tablet Take 1 tablet (100 mg total) by mouth daily. 03/30/17  Yes Emokpae, Courage, MD  naloxone [facility]) nasal spray 4 mg/0.1 mL as needed. 07/13/17   [provider]    Allergies    Ace inhibitors, Losartan, and Tylenol [acetaminophen]  Review of Systems   Review of Systems  Unable to perform ROS: Mental status change  LEVEL 5 CAVEAT  Physical Exam Updated Vital Signs BP 96/65   Pulse 70   Temp (S) (!) 94 F (34.4 C) (Rectal)   Resp 17   SpO2 100%   General: chronically ill appearing. cachetic HEENT: head atraumatic. Mild scleral icteris. Nares and oral airway appear patent. No apparent tongue lacerations. Cardiac: RRR. Extremities cool to touch Pulm: diffuse crackles throughout. No apparent respiratory distress on supplemental O2 Abd: bs active. Scaphoid  Neuro: alert but not oriented. No apparent facial assymetry. Moving all four extremities.  MSK: strength intact Skin: no rash ED Results / Procedures / Treatments   Labs (all labs ordered are listed, but only abnormal results are displayed) Labs Reviewed  ACETAMINOPHEN LEVEL - Abnormal; Notable for the following components:      Result Value   Acetaminophen (Tylenol), Serum <10 (*)    All other components within normal limits  COMPREHENSIVE METABOLIC PANEL - Abnormal; Notable for the following components:   Potassium 2.9 (*)    Glucose, Bld 58 (*)    BUN 6 (*)    Creatinine, Ser 0.60 (*)    Calcium 8.2 (*)    Total Protein 4.7 (*)    Albumin 2.0 (*)    Alkaline Phosphatase 257 (*)    Total Bilirubin 1.4 (*)    All other components within normal limits  SALICYLATE LEVEL - Abnormal; Notable for the following components:   Salicylate Lvl <1.6 (*)    All other components within normal limits  LACTIC ACID, PLASMA - Abnormal; Notable for  the following components:   Lactic Acid, Venous 3.5 (*)    All other components within normal limits  CBC WITH DIFFERENTIAL/PLATELET - Abnormal; Notable for the following components:   WBC 12.3 (*)    RBC 3.80 (*)  Hemoglobin 12.5 (*)    HCT 35.8 (*)    Neutro Abs 10.2 (*)    Abs Immature Granulocytes 0.08 (*)    All other components within normal limits  CBG MONITORING, ED - Abnormal; Notable for the following components:   Glucose-Capillary 230 (*)    All other components within normal limits  CULTURE, BLOOD (ROUTINE X 2)  CULTURE, BLOOD (ROUTINE X 2)  URINE CULTURE  ETHANOL  LIPASE, BLOOD  TSH  MAGNESIUM  LACTIC ACID, PLASMA  RAPID URINE DRUG SCREEN, HOSP PERFORMED  URINALYSIS, ROUTINE W REFLEX MICROSCOPIC  APTT  PROTIME-INR  CBG MONITORING, ED  I-STAT CHEM 8, ED    EKG None  Radiology CT Head Wo Contrast  Result Date: 03/10/2019 CLINICAL DATA:  Possible seizure today. EXAM: CT HEAD WITHOUT CONTRAST TECHNIQUE: Contiguous axial images were obtained from the base of the skull through the vertex without intravenous contrast. COMPARISON:  Head CT scan 02/26/2019.  Brain MRI 03/26/2017. FINDINGS: Brain: No evidence of acute infarction, hemorrhage, hydrocephalus, extra-axial collection or mass lesion/mass effect. Atrophy and chronic microvascular ischemic change noted. Vascular: Atherosclerosis. Skull: Intact.  No focal lesion. Sinuses/Orbits: Thickening of the walls of the left maxillary sinus consistent with chronic sinus disease noted. The patient is status post ethmoidectomy. No mucosal thickening identified. Other: None. IMPRESSION: No acute abnormality. Atrophy and chronic microvascular ischemic change. Electronically Signed   By: Inge Rise M.D.   On: 03/10/2019 15:19   DG Chest Port 1 View  Result Date: 03/10/2019 CLINICAL DATA:  Altered mental status, seizure, possible aspiration. EXAM: PORTABLE CHEST 1 VIEW COMPARISON:  Chest radiograph 02/27/2019 FINDINGS:  Heart size within normal limits. Airspace disease within the right lung base persists, although has improved since prior examination 02/27/2019. The left lung remains clear. No evidence of pleural effusion or pneumothorax. Redemonstrated displaced distal right clavicle fracture. Thoracic spondylosis. IMPRESSION: Persistent although improved airspace disease within the right lung base as compared to chest radiograph 02/27/2019. The left lung remains clear. Redemonstrated displaced distal right clavicle fracture. Electronically Signed   By: Kellie Simmering DO   On: 03/10/2019 14:52     Medications Ordered in ED Medications  sodium chloride 0.9 % bolus 500 mL (has no administration in time range)  magnesium sulfate IVPB 2 g 50 mL (has no administration in time range)  potassium chloride 10 mEq in 100 mL IVPB (has no administration in time range)  lactated ringers bolus 1,000 mL (has no administration in time range)    And  lactated ringers bolus 500 mL (has no administration in time range)  sodium chloride 0.9 % bolus 1,000 mL (0 mLs Intravenous Stopped 03/10/19 1431)  thiamine (B-1) injection 100 mg (100 mg Intravenous Given 03/10/19 1429)  LORazepam (ATIVAN) injection 1 mg (1 mg Intravenous Given 03/10/19 1429)  piperacillin-tazobactam (ZOSYN) IVPB 3.375 g (0 g Intravenous Stopped 03/10/19 1431)    ED Course  I have reviewed the triage vital signs and the nursing notes.  Pertinent labs & imaging results that were available during my care of the patient were reviewed by me and considered in my medical decision making (see chart for details).  Clinical Course as of Mar 10 1611  Mon Mar 10, 2019  1331 Initial assessment. AMS. Seizure likely resulting in aspiration pneumonitis. History of cirrhosis. Broad ddx for now. Sepsis vs toxin induced vs neurogenic etc.  Seizure possibly precipitated by hypoglycemia. Initial CBG 42 by EMS. Now >200.  Labs ordered. CT head ordered.   [RC]  1430 Mild  leukocytosis. Blood cultures collected   [RC]  1529 Calling code sepsis.   [RC]  1539 Persistent disease in the right lung although improved from prior. No sign of infiltrate however could consider obtaining 2 view at a later time to evaluate for retrocardiac infiltrate.  No significant changes on head CT. No bleed.  Lactate elevated at 3.5. Will start fluids at 8m/kg. He will require inpatient manage so will call for admission.    [RC]  14315Hypokalemic--IV replaced ordered.   [RC]    Clinical Course User Index [RC] CMitzi Hansen MD   MDM Rules/Calculators/A&P                      SEE ED COURSE FOR MDM Final Clinical Impression(s) / ED Diagnoses Final diagnoses:  [place]  Hypoglycemia  Hypothermia, initial encounter  Hypokalemia  Sepsis, due to unspecified organism, unspecified whether acute organ dysfunction present ([facility]    Rx / [place] HeightsOrders ED Discharge Orders    None       CMitzi Hansen MD 03/10/19 1613    RQuintella Reichert MD 03/12/19 [phone]

## 2019-03-10 NOTE — Progress Notes (Signed)
Floor coverage paged regarding previous note if previous attending hasn't received ABG results or message.

## 2019-03-10 NOTE — ED Provider Notes (Signed)
Angiocath insertion Performed by: Quintella Reichert  Consent: Verbal consent obtained. Risks and benefits: risks, benefits and alternatives were discussed Time out: Immediately prior to procedure a "time out" was called to verify the correct patient, procedure, equipment, support staff and site/side marked as required.  Preparation: Patient was prepped and draped in the usual sterile fashion.  Vein Location: right AC  Ultrasound Guided  Gauge: 20  Normal blood return and flush without difficulty Patient tolerance: Patient tolerated the procedure well with no immediate complications.      Quintella Reichert, MD 03/10/19 1640

## 2019-03-10 NOTE — Progress Notes (Signed)
Nurse attempt at second IV site unsuccessful.  IV team attempt was unsuccessful.  Pt refused to allow IV team to try again. Pt with one IV access.

## 2019-03-11 ENCOUNTER — Inpatient Hospital Stay (HOSPITAL_COMMUNITY): Payer: Medicare (Managed Care)

## 2019-03-11 LAB — GLUCOSE, CAPILLARY
Glucose-Capillary: 262 mg/dL — ABNORMAL HIGH (ref 70–99)
Glucose-Capillary: 300 mg/dL — ABNORMAL HIGH (ref 70–99)
Glucose-Capillary: 519 mg/dL (ref 70–99)
Glucose-Capillary: 71 mg/dL (ref 70–99)
Glucose-Capillary: 79 mg/dL (ref 70–99)

## 2019-03-11 LAB — SARS CORONAVIRUS 2 (TAT 6-24 HRS): SARS Coronavirus 2: NEGATIVE

## 2019-03-11 LAB — CBC
HCT: 40.1 % (ref 39.0–52.0)
Hemoglobin: 14.6 g/dL (ref 13.0–17.0)
MCH: 33.7 pg (ref 26.0–34.0)
MCHC: 36.4 g/dL — ABNORMAL HIGH (ref 30.0–36.0)
MCV: 92.6 fL (ref 80.0–100.0)
Platelets: 146 10*3/uL — ABNORMAL LOW (ref 150–400)
RBC: 4.33 MIL/uL (ref 4.22–5.81)
RDW: 14.3 % (ref 11.5–15.5)
WBC: 13.1 10*3/uL — ABNORMAL HIGH (ref 4.0–10.5)
nRBC: 0 % (ref 0.0–0.2)

## 2019-03-11 LAB — LACTIC ACID, PLASMA: Lactic Acid, Venous: 2.8 mmol/L (ref 0.5–1.9)

## 2019-03-11 LAB — COMPREHENSIVE METABOLIC PANEL
ALT: 37 U/L (ref 0–44)
AST: 37 U/L (ref 15–41)
Albumin: 2.3 g/dL — ABNORMAL LOW (ref 3.5–5.0)
Alkaline Phosphatase: 283 U/L — ABNORMAL HIGH (ref 38–126)
Anion gap: 12 (ref 5–15)
BUN: <5 mg/dL — ABNORMAL LOW (ref 8–23)
CO2: 25 mmol/L (ref 22–32)
Calcium: 8 mg/dL — ABNORMAL LOW (ref 8.9–10.3)
Chloride: 103 mmol/L (ref 98–111)
Creatinine, Ser: 0.62 mg/dL (ref 0.61–1.24)
GFR calc Af Amer: >60 mL/min (ref >60)
GFR calc non Af Amer: >60 mL/min (ref >60)
Glucose, Bld: 214 mg/dL — ABNORMAL HIGH (ref 70–99)
Potassium: 2.9 mmol/L — ABNORMAL LOW (ref 3.5–5.1)
Sodium: 140 mmol/L (ref 135–145)
Total Bilirubin: 1.1 mg/dL (ref 0.3–1.2)
Total Protein: 5.6 g/dL — ABNORMAL LOW (ref 6.5–8.1)

## 2019-03-11 LAB — GLUCOSE, RANDOM: Glucose, Bld: 278 mg/dL — ABNORMAL HIGH (ref 70–99)

## 2019-03-11 LAB — VITAMIN B12: Vitamin B-12: 1168 pg/mL — ABNORMAL HIGH (ref 180–914)

## 2019-03-11 LAB — TSH: TSH: 0.273 u[IU]/mL — ABNORMAL LOW (ref 0.350–4.500)

## 2019-03-11 LAB — HEMOGLOBIN A1C
Hgb A1c MFr Bld: 14.8 % — ABNORMAL HIGH (ref 4.8–5.6)
Mean Plasma Glucose: 378.06 mg/dL

## 2019-03-11 LAB — PROCALCITONIN: Procalcitonin: 1.85 ng/mL

## 2019-03-11 LAB — BRAIN NATRIURETIC PEPTIDE: B Natriuretic Peptide: 229.5 pg/mL — ABNORMAL HIGH (ref 0.0–100.0)

## 2019-03-11 MED ORDER — FUROSEMIDE 10 MG/ML IJ SOLN
40.0000 mg | Freq: Once | INTRAMUSCULAR | Status: AC
  Administered 2019-03-11: 40 mg via INTRAVENOUS
  Filled 2019-03-11: qty 4

## 2019-03-11 MED ORDER — INSULIN ASPART 100 UNIT/ML IV SOLN
10.0000 [IU] | Freq: Once | INTRAVENOUS | Status: AC
  Administered 2019-03-11: 16:00:00 10 [IU] via INTRAVENOUS

## 2019-03-11 MED ORDER — INSULIN ASPART 100 UNIT/ML ~~LOC~~ SOLN: 0.0000 [IU] | Freq: Three times a day (TID) | SUBCUTANEOUS | Status: DC

## 2019-03-11 MED ORDER — BUDESONIDE 0.5 MG/2ML IN SUSP
0.5000 mg | Freq: Two times a day (BID) | RESPIRATORY_TRACT | Status: DC
  Administered 2019-03-11 – 2019-03-19 (×16): 0.5 mg via RESPIRATORY_TRACT
  Filled 2019-03-11 (×17): qty 2

## 2019-03-11 MED ORDER — INSULIN ASPART 100 UNIT/ML ~~LOC~~ SOLN: 0.0000 [IU] | Freq: Every day | SUBCUTANEOUS | Status: DC

## 2019-03-11 MED ORDER — POLYETHYLENE GLYCOL 3350 17 G PO PACK: 17.0000 g | PACK | Freq: Every day | ORAL | Status: DC | PRN

## 2019-03-11 MED ORDER — SODIUM CHLORIDE 0.9 % IV SOLN
INTRAVENOUS | Status: DC | PRN
  Administered 2019-03-11 (×2): 250 mL via INTRAVENOUS
  Administered 2019-03-14: 1000 mL via INTRAVENOUS

## 2019-03-11 MED ORDER — POTASSIUM CHLORIDE 10 MEQ/100ML IV SOLN
10.0000 meq | INTRAVENOUS | Status: AC
  Administered 2019-03-11 (×4): 10 meq via INTRAVENOUS
  Filled 2019-03-11 (×2): qty 100

## 2019-03-11 MED ORDER — METHYLPREDNISOLONE SODIUM SUCC 40 MG IJ SOLR
40.0000 mg | Freq: Two times a day (BID) | INTRAMUSCULAR | Status: DC
  Administered 2019-03-11 – 2019-03-18 (×14): 40 mg via INTRAVENOUS
  Filled 2019-03-11 (×15): qty 1

## 2019-03-11 MED ORDER — METHYLPREDNISOLONE SODIUM SUCC 40 MG IJ SOLR
40.0000 mg | Freq: Three times a day (TID) | INTRAMUSCULAR | Status: DC
  Administered 2019-03-11: 40 mg via INTRAVENOUS
  Filled 2019-03-11: qty 1

## 2019-03-11 MED ORDER — IPRATROPIUM-ALBUTEROL 0.5-2.5 (3) MG/3ML IN SOLN: 3.0000 mL | RESPIRATORY_TRACT | Status: DC | PRN

## 2019-03-11 MED ORDER — DEXTROSE-NACL 5-0.45 % IV SOLN: INTRAVENOUS | Status: DC

## 2019-03-11 MED ORDER — SENNOSIDES-DOCUSATE SODIUM 8.6-50 MG PO TABS: 2.0000 | ORAL_TABLET | Freq: Every evening | ORAL | Status: DC | PRN

## 2019-03-11 MED ORDER — INSULIN GLARGINE 100 UNIT/ML ~~LOC~~ SOLN
5.0000 [IU] | Freq: Once | SUBCUTANEOUS | Status: AC
  Administered 2019-03-11: 5 [IU] via SUBCUTANEOUS
  Filled 2019-03-11: qty 0.05

## 2019-03-11 MED ORDER — MAGNESIUM SULFATE 2 GM/50ML IV SOLN
2.0000 g | Freq: Once | INTRAVENOUS | Status: AC
  Administered 2019-03-11: 2 g via INTRAVENOUS
  Filled 2019-03-11: qty 50

## 2019-03-11 MED ORDER — IPRATROPIUM-ALBUTEROL 0.5-2.5 (3) MG/3ML IN SOLN
3.0000 mL | Freq: Four times a day (QID) | RESPIRATORY_TRACT | Status: DC
  Administered 2019-03-11: 3 mL via RESPIRATORY_TRACT
  Filled 2019-03-11: qty 3

## 2019-03-11 MED ORDER — FUROSEMIDE 10 MG/ML IJ SOLN
40.0000 mg | Freq: Once | INTRAMUSCULAR | Status: DC
  Filled 2019-03-11: qty 4

## 2019-03-11 MED ORDER — LACTULOSE 10 GM/15ML PO SOLN
20.0000 g | Freq: Two times a day (BID) | ORAL | Status: DC
  Administered 2019-03-11 – 2019-03-13 (×3): 20 g via ORAL
  Filled 2019-03-11 (×5): qty 30

## 2019-03-11 MED ORDER — INSULIN ASPART 100 UNIT/ML ~~LOC~~ SOLN
0.0000 [IU] | SUBCUTANEOUS | Status: DC
  Administered 2019-03-11: 5 [IU] via SUBCUTANEOUS
  Administered 2019-03-12: 2 [IU] via SUBCUTANEOUS
  Administered 2019-03-12: 1 [IU] via SUBCUTANEOUS
  Administered 2019-03-12: 3 [IU] via SUBCUTANEOUS
  Administered 2019-03-13: 2 [IU] via SUBCUTANEOUS
  Administered 2019-03-13: 7 [IU] via SUBCUTANEOUS
  Administered 2019-03-13: 3 [IU] via SUBCUTANEOUS
  Administered 2019-03-13: 1 [IU] via SUBCUTANEOUS
  Administered 2019-03-13: 2 [IU] via SUBCUTANEOUS
  Administered 2019-03-14: 3 [IU] via SUBCUTANEOUS
  Administered 2019-03-14: 2 [IU] via SUBCUTANEOUS
  Administered 2019-03-14 (×2): 3 [IU] via SUBCUTANEOUS
  Administered 2019-03-14: 13:00:00 2 [IU] via SUBCUTANEOUS
  Administered 2019-03-15: 7 [IU] via SUBCUTANEOUS
  Administered 2019-03-15: 2 [IU] via SUBCUTANEOUS
  Administered 2019-03-15 (×2): 3 [IU] via SUBCUTANEOUS
  Administered 2019-03-15: 1 [IU] via SUBCUTANEOUS
  Administered 2019-03-16: 2 [IU] via SUBCUTANEOUS
  Administered 2019-03-16: 7 [IU] via SUBCUTANEOUS
  Administered 2019-03-16: 5 [IU] via SUBCUTANEOUS
  Administered 2019-03-16: 13:00:00 2 [IU] via SUBCUTANEOUS
  Administered 2019-03-17: 1 [IU] via SUBCUTANEOUS
  Administered 2019-03-17: 3 [IU] via SUBCUTANEOUS
  Administered 2019-03-17: 5 [IU] via SUBCUTANEOUS
  Administered 2019-03-18: 1 [IU] via SUBCUTANEOUS
  Administered 2019-03-18: 2 [IU] via SUBCUTANEOUS
  Administered 2019-03-18: 5 [IU] via SUBCUTANEOUS
  Administered 2019-03-18: 2 [IU] via SUBCUTANEOUS
  Administered 2019-03-19: 1 [IU] via SUBCUTANEOUS

## 2019-03-11 MED ORDER — OXYCODONE HCL 5 MG PO TABS
5.0000 mg | ORAL_TABLET | Freq: Once | ORAL | Status: AC
  Administered 2019-03-11: 5 mg via ORAL
  Filled 2019-03-11: qty 1

## 2019-03-11 MED ORDER — INSULIN ASPART 100 UNIT/ML ~~LOC~~ SOLN
0.0000 [IU] | SUBCUTANEOUS | Status: DC
  Administered 2019-03-11: 16:00:00 9 [IU] via SUBCUTANEOUS

## 2019-03-11 NOTE — Progress Notes (Signed)
Inpatient Diabetes Program Recommendations  AACE/ADA: New Consensus Statement on Inpatient Glycemic Control   Target Ranges:  Prepandial:   less than 140 mg/dL      Peak postprandial:   less than 180 mg/dL (1-2 hours)      Critically ill patients:  140 - 180 mg/dL  Results for [name], [name] (MRN [id number]) as of 03/11/2019 13:48  Ref. Range 03/10/2019 13:20 03/10/2019 14:55 03/10/2019 17:17 03/10/2019 17:33 03/10/2019 17:54 03/10/2019 20:39 03/10/2019 21:06 03/11/2019 00:04 03/11/2019 02:55 03/11/2019 11:42  Glucose-Capillary Latest Ref Range: 70 - 99 mg/dL 230 (H) 94 22 (LL) 138 (H) 123 (H) 30 (LL) 131 (H) 79 71 262 (H)    Review of Glycemic Control  Diabetes history: DM2 Outpatient Diabetes medications: Glipizide 10 mg QAM, Levemir 15 units BID, Novolog 5 units with breakfast, Novolog 10 units with lunch and supper Current orders for Inpatient glycemic control: None; Solumedrol 40 mg Q8H  Inpatient Diabetes Program Recommendations:   Insulin-Correction: Please consider ordering Novolog 0-15 units Q4H.  HbgA1C: Please consider ordering an A1C to evaluate glycemic control over the past 2-3 months.  NOTE: Noted patient found to be hypoglycemic at home with seizures. In reviewing chart, noted patient was recently inpatient 02/26/19 with seizure and hyperglycemia and left AMA on 02/27/19.Noted glucose up to 262 mg/dl at 11:42 today and is ordered steroids. Would recommend ordering Novolog correction Q4H at this time. If glucose is consistently over 180 mg/dl with Novolog correction, will likely need to order basal insulin.  Thanks, Barnie Alderman, RN, MSN, CDE Diabetes Coordinator Inpatient Diabetes Program [phone] (Team Pager from 8am to 5pm)

## 2019-03-11 NOTE — Progress Notes (Signed)
Patient refused his EKG for this shift,his cbg wasrefused was able to get his labs finally with much convincing patient(writer got stat serum gluclose to follow his elevated CBG level await the results.) He is pulling at his a/c piv forgets and kinks his arm in upward position makes it beep constantly writer put in for  IV consult to relocate IV. He threatned to pull the IV out and refused his last run KCL was able to get 5 runs KCL in him. He did get his mag sulfate run in today as well as his IVPB ABT's. His diet was changed to diabetic carb controlled/HHD with nectar thick liquids till speech comes to evaluate him. He coughs a lot with his food and liquids.

## 2019-03-11 NOTE — Progress Notes (Addendum)
Patient bed alarm went off, staff responded and pt found on one knee on the floor.  Patient stated he was not hurt and only fell on the one knee.  APP notified.   Pt had yellow no slip socks on and call bell was within reach.

## 2019-03-11 NOTE — Progress Notes (Signed)
Patient refuses lab draws.  APP notified.

## 2019-03-11 NOTE — Progress Notes (Signed)
Patient refusing all care this point,notifed MD he stated place patient on diabetic diet with aspiration precautions. I called patient's sister per his request he told them wanted to be picked up go home the sister told him no he was sick needed to stay in hospital and get well they were not coming to get him he threw the phone called her a bitch. I was unable give evening meds he refused them. He told me we were lying out his sugar being up and there was nothing wrong with his swallowing I tried to assure him we had his best interest in mind he stated otherwise. Dr.Amin was informed of above and care diffulculties new orders noted.

## 2019-03-11 NOTE — Progress Notes (Signed)
Patient now agreeable to staying but refuses EKG.

## 2019-03-11 NOTE — Progress Notes (Addendum)
Patient refuses 2am cbg check.  Requesting staff to leave him alone. Will reattempt at 0400.

## 2019-03-11 NOTE — Progress Notes (Signed)
Patient given a [place] sandwich and sprite zero after diet order placed by APP.  Patient stated to RN that he would be going home after eating his sandwich.  Patient will not listen to be updated about his care or why it is important for him to stay.  Patient alert and oriented.  APP notified.

## 2019-03-11 NOTE — Progress Notes (Signed)
In to check on patient reported needed use urinal reported beside him assisted him with urinal he reported could get it himself he urinated all over his legs had to wash him up,then I offered condom catheter again he agreed to let us try to put one on him. He is forgetful can't remember where urinal is and is cumbersome with the urinal. He did allow the lab in to draw labs after much convincing via Probation officer. He is snacking and eating well he is pending speech evaluation this am.

## 2019-03-11 NOTE — Progress Notes (Signed)
Patient refused EKG this time. He is resting in bed at present. Conts to receive KCL runs has completed MAG Sulfate run as well.

## 2019-03-11 NOTE — Progress Notes (Addendum)
Patient called for assistance to go the the bathroom.  Able to walk with walker with minimal assistance and stating he feels much better.  Asking for something to eat.  Notified APP.    Pt now agreeable to checking CBG-71.

## 2019-03-11 NOTE — Progress Notes (Signed)
CBG 519 reported to MD he ordered additional doses of insulin. Patient asking to eat told him CBG must come down first,he got mad got on phone called family member told them to come get him. Patient calls out for me each time I pass his room or go into room across the hall becomes angry if I go into my other patient's room,stated that bitch does not need help as bad as I do she can wait. Writer reassured him after I attend to a patient returning from surgical procedure I would attend to him,I had to get a patient checked in. Patient stated did not give a dam.

## 2019-03-11 NOTE — Progress Notes (Signed)
Lab called did not have enough blood collected to run labs reported to lab patient is a diffulcult stick I had attempted to draw his labs myself and was unsucesssful as well. Lab reported would reorder labs again see could obtain. Will alert MD.

## 2019-03-11 NOTE — Progress Notes (Signed)
PROGRESS NOTE    Michael [name]  [id number] DOB: Oct 16, 1955 DOA: 03/10/2019 PCP: Helane Rima, MD   Brief Narrative:  64 year old with history of alcohol abuse, tobacco use, DM2, HTN, GERD, hepatitis B, bipolar disorder, pancreatitis, recent seizures requiring intubation about 2 weeks ago ended up leaving [place] presents to the hospital after having witnessed seizure at home.  He was noted to be hypoglycemic, hypothermic, elevated lactate.  CT head was negative.  Neurology team was consulted.   Assessment & Plan:   Principal Problem:   Acute metabolic encephalopathy Active Problems:   Type 2 diabetes mellitus with peripheral neuropathy (HCC)   Chronic alcoholic pancreatitis (HCC)   Protein-calorie malnutrition, severe   Tobacco abuse   Hypoglycemia   Alcohol use disorder, severe, dependence (HCC)   Seizure ([place])   Hypothermia   Sepsis ([place])  Acute respiratory distress with coughing Acute COPD exacerbation -Stop IV fluids.  Aggressive bronchodilators, Solu-Medrol.  Incentive spirometer and flutter valve. -Aspiration precautions. -Lasix 40 mg IV X2 doses today. -COVID-19-negative -Check BNP, procalcitonin  Acute metabolic encephalopathy, postictal Seizures, severe recurrent. Alcohol abuse -Unsure if hypoglycemia precipitated this over the BLE around. -Neurology team consulted.  NPO.  Neurochecks. -CT head negative. -Monitor for withdrawals given alcohol abuse. -Alcohol withdrawal protocol. -Keppra 500 mg twice daily -EEG -Check TSH, B12, folate  Sepsis secondary to aspiration pneumonia, present on admission -Maintain aspiration precautions.  Check procalcitonin levels -Empirically on Zosyn.  Diabetes mellitus type 2, poorly controlled due to hyperglycemia -Periodic Accu-Cheks.  All diabetic medications on hold. -D5 infusion stopped due to concerns of fluid overload.  Speech and swallow evaluation, nursing staff will continue POC-we will treated  as necessary.  Hypothermia -Suspect secondary to seizure.  Low suspicion for sepsis. -Random cortisol levels-34.3  Prolonged QTC -Admission QTC 646.  Suspect from electrolyte abnormality.  Elevated ammonia, 60 -Lactulose ordered  Hypokalemia/hypomagnesemia -Replete aggressively as necessary  Daily tobacco use -Nicotine patch  Essential hypertension -Currently on hold  GERD -IV PPI   DVT prophylaxis: Lovenox Code Status: Full Family Communication: None Disposition Plan:   Patient From= home  Patient Anticipated D/C place= Home  Barriers= still undergoing work-up for seizures at the moment.  Unsafe for discharge at this time.  EEG currently pending.  Will require neuro clearance.  In the meantime also getting aggressive breathing treatments due to significantly abnormal breath sounds.  Consultants:   Neurology    Subjective:  Walked in the room patient denied any complaints but he was clearly dyspneic and coughing.  He had just finished eating all of his breakfast.  Review of Systems Otherwise negative except as per HPI, including: General: Denies fever, chills, night sweats or unintended weight loss. Resp: Denies cough, wheezing, shortness of breath. Cardiac: Denies chest pain, palpitations, orthopnea, paroxysmal nocturnal dyspnea. GI: Denies abdominal pain, nausea, vomiting, diarrhea or constipation GU: Denies dysuria, frequency, hesitancy or incontinence MS: Denies muscle aches, joint pain or swelling Neuro: Denies headache, neurologic deficits (focal weakness, numbness, tingling), abnormal gait Psych: Denies anxiety, depression, SI/HI/AVH Skin: Denies new rashes or lesions ID: Denies sick contacts, exotic exposures, travel  Examination:  General exam: Appears calm and comfortable, poor dentition. Respiratory system: Dyspnea with diffuse rhonchi. Cardiovascular system: S1 & S2 heard, RRR. No JVD, murmurs, rubs, gallops or clicks. No pedal  edema. Gastrointestinal system: Abdomen is nondistended, soft and nontender. No organomegaly or masses felt. Normal bowel sounds heard. Central nervous system: Alert and oriented. No focal neurological deficits. Extremities: Symmetric 5 x 5  power. Skin: No rashes, lesions or ulcers Psychiatry: Poor judgment and insight    Objective: Vitals:   03/10/19 1730 03/10/19 1747 03/10/19 1813 03/11/19 0021  BP: (!) 90/53 (!) 90/57 99/69 100/62  Pulse: 75 71 72 74  Resp: (!) 22 (!) 22 19 20   Temp:   97.7 F (36.5 C) 98.9 F (37.2 C)  TempSrc:   Oral Oral  SpO2: 100% 100% 100% 100%  Weight:   46.2 kg   Height:   5\' 4"  (1.626 m)     Intake/Output Summary (Last 24 hours) at 03/11/2019 0737 Last data filed at 03/11/2019 0500 Gross per 24 hour  Intake 692.71 ml  Output --  Net 692.71 ml   Filed Weights   03/10/19 1813  Weight: 46.2 kg     Data Reviewed:   CBC: Recent Labs  Lab 03/10/19 1350  WBC 12.3*  NEUTROABS 10.2*  HGB 12.5*  HCT 35.8*  MCV 94.2  PLT 99991111   Basic Metabolic Panel: Recent Labs  Lab 03/10/19 1350  NA 145  K 2.9*  CL 105  CO2 28  GLUCOSE 58*  BUN 6*  CREATININE 0.60*  CALCIUM 8.2*  MG 1.7   GFR: Estimated Creatinine Clearance: 63.4 mL/min (A) (by C-G formula based on SCr of 0.6 mg/dL (L)). Liver Function Tests: Recent Labs  Lab 03/10/19 1350  AST 39  ALT 37  ALKPHOS 257*  BILITOT 1.4*  PROT 4.7*  ALBUMIN 2.0*   Recent Labs  Lab 03/10/19 1350  LIPASE 12   Recent Labs  Lab 03/10/19 1845  AMMONIA 60*   Coagulation Profile: Recent Labs  Lab 03/10/19 1650  INR 1.1   Cardiac Enzymes: No results for input(s): CKTOTAL, CKMB, CKMBINDEX, TROPONINI in the last 168 hours. BNP (last 3 results) No results for input(s): PROBNP in the last 8760 hours. HbA1C: No results for input(s): HGBA1C in the last 72 hours. CBG: Recent Labs  Lab 03/10/19 1754 03/10/19 2039 03/10/19 2106 03/11/19 0004 03/11/19 0255  GLUCAP 123* 30* 131* 79  71   Lipid Profile: No results for input(s): CHOL, HDL, LDLCALC, TRIG, CHOLHDL, LDLDIRECT in the last 72 hours. Thyroid Function Tests: Recent Labs    03/10/19 1350  TSH 0.382   Anemia Panel: No results for input(s): VITAMINB12, FOLATE, FERRITIN, TIBC, IRON, RETICCTPCT in the last 72 hours. Sepsis Labs: Recent Labs  Lab 03/10/19 1350 03/10/19 1650  LATICACIDVEN 3.5* 2.7*    Recent Results (from the past 240 hour(s))  Culture, blood (routine x 2)     Status: None (Preliminary result)   Collection Time: 03/10/19  2:20 PM   Specimen: BLOOD LEFT HAND  Result Value Ref Range Status   Specimen Description BLOOD LEFT HAND  Final   Special Requests   Final    BOTTLES DRAWN AEROBIC ONLY Blood Culture results may not be optimal due to an inadequate volume of blood received in culture bottles   Culture   Final    NO GROWTH < 24 HOURS Performed at [facility], [place] [address] [place],  [address]    Report Status PENDING  Incomplete  SARS CORONAVIRUS 2 (TAT 6-24 HRS) Nasopharyngeal Nasopharyngeal Swab     Status: None   Collection Time: 03/10/19  5:14 PM   Specimen: Nasopharyngeal Swab  Result Value Ref Range Status   SARS Coronavirus 2 NEGATIVE NEGATIVE Final    Comment: (NOTE) SARS-CoV-2 target nucleic acids are NOT DETECTED. The SARS-CoV-2 RNA is generally detectable in upper and lower  respiratory specimens during the acute phase of infection. Negative results do not preclude SARS-CoV-2 infection, do not rule out co-infections with other pathogens, and should not be used as the sole basis for treatment or other patient management decisions. Negative results must be combined with clinical observations, patient history, and epidemiological information. The expected result is Negative. Fact Sheet for Patients: SugarRoll.be Fact Sheet for Healthcare Providers: https://www.woods-mathews.com/ This test is not yet approved or  cleared by the [place] FDA and  has been authorized for detection and/or diagnosis of SARS-CoV-2 by FDA under an Emergency Use Authorization (EUA). This EUA will remain  in effect (meaning this test can be used) for the duration of the COVID-19 declaration under Section 56 4(b)(1) of the Act, 21 U.S.C. section 360bbb-3(b)(1), unless the authorization is terminated or revoked sooner. Performed at [facility], [place] [address], [place], [place] [address]          Radiology Studies: CT Head Wo Contrast  Result Date: 03/10/2019 CLINICAL DATA:  Possible seizure today. EXAM: CT HEAD WITHOUT CONTRAST TECHNIQUE: Contiguous axial images were obtained from the base of the skull through the vertex without intravenous contrast. COMPARISON:  Head CT scan 02/26/2019.  Brain MRI 03/26/2017. FINDINGS: Brain: No evidence of acute infarction, hemorrhage, hydrocephalus, extra-axial collection or mass lesion/mass effect. Atrophy and chronic microvascular ischemic change noted. Vascular: Atherosclerosis. Skull: Intact.  No focal lesion. Sinuses/Orbits: Thickening of the walls of the left maxillary sinus consistent with chronic sinus disease noted. The patient is status post ethmoidectomy. No mucosal thickening identified. Other: None. IMPRESSION: No acute abnormality. Atrophy and chronic microvascular ischemic change. Electronically Signed   By: Inge Rise M.D.   On: 03/10/2019 15:19   DG Chest Port 1 View  Result Date: 03/10/2019 CLINICAL DATA:  Altered mental status, seizure, possible aspiration. EXAM: PORTABLE CHEST 1 VIEW COMPARISON:  Chest radiograph 02/27/2019 FINDINGS: Heart size within normal limits. Airspace disease within the right lung base persists, although has improved since prior examination 02/27/2019. The left lung remains clear. No evidence of pleural effusion or pneumothorax. Redemonstrated displaced distal right clavicle fracture. Thoracic spondylosis. IMPRESSION: Persistent  although improved airspace disease within the right lung base as compared to chest radiograph 02/27/2019. The left lung remains clear. Redemonstrated displaced distal right clavicle fracture. Electronically Signed   By: Kellie Simmering DO   On: 03/10/2019 14:52        Scheduled Meds: . dextrose      . enoxaparin (LOVENOX) injection  40 mg Subcutaneous Q24H  . folic acid  1 mg Oral Daily  . multivitamin with minerals  1 tablet Oral Daily  . sodium chloride flush  3 mL Intravenous Q12H  . thiamine  100 mg Oral Daily   Or  . thiamine  100 mg Intravenous Daily   Continuous Infusions: . sodium chloride 250 mL (03/11/19 0011)  . dextrose 5 % with KCl 20 mEq / L 20 mEq (03/10/19 2027)  . piperacillin-tazobactam (ZOSYN)  IV 3.375 g (03/11/19 0012)     LOS: 1 day   Time spent= 35 mins    Bailie Christenbury Arsenio Loader, MD [facility]  If 7PM-7AM, please contact night-coverage  03/11/2019, 7:37 AM

## 2019-03-11 NOTE — Progress Notes (Signed)
In to check on patient he stated needed to use the bathroom attempted get him up to bedside commode he was incontinent of stool from chair to bedside commode,all over the floor he stated sorry can't hold it. He has been incontinent of stool 3 times so far requiring complete linen changes each time. He does not give any warning or indicator he has to go to bathroom. His cbg is 262 so far. He did allow his EEG be completed this am after much convincing via Probation officer.

## 2019-03-11 NOTE — Progress Notes (Signed)
Patient refusing VS, EKG, or CBG check this morning.

## 2019-03-11 NOTE — Progress Notes (Signed)
Subjective: Feels back to normal.  He states that all of his previous seizures have been in the setting of sugar abnormalities.  Certainly both the seizure few weeks ago as well as 1 currently more in the setting of either hyper or hypoglycemia.  Exam: Vitals:   03/11/19 1049 03/11/19 1313  BP:  (!) 82/56  Pulse:  83  Resp:  15  Temp:  98.7 F (37.1 C)  SpO2: 100% 100%   Gen: In bed, NAD Resp: non-labored breathing, no acute distress Abd: soft, nt  Neuro: MS: Awake, alert, oriented CN: Pupils equal and reactive, eyes are slightly disconjugate (patient states baseline) visual fields full Motor: 5/5 throughout Sensory: Intact light touch  Pertinent Labs: Glucose was 214  Impression: 64 year old male with recurrent seizures in the setting of severe glucose abnormalities.  Given that I do not think he has ever had a clear unprovoked seizure, unless he had epileptiform abnormality on his EEG I would not favor starting antiepileptic therapy.  At this time, his EEG has been performed without epileptiform abnormality.  Recommendations: 1) would not favor starting Keppra 2) further evaluation only if he has unprovoked seizures in the future. 3) please call with further questions or concerns.  Roland Rack, MD [facility] [phone]  If 7pm- 7am, please page neurology on call as listed in [place].

## 2019-03-11 NOTE — Procedures (Signed)
Patient Name: Michael [name]  MRN: [id number]  Epilepsy Attending: Lora Havens  Referring Physician/Provider: Dr Vernell Leep Date: 03/11/2019 Duration: 23.31 mins  Patient history: 64 year old with history of alcohol abuse,  bipolar disorder,  recent seizures who presents to the hospital after having witnessed seizure at home. EEG to evaluate for seizure.  Level of alertness: awake  AEDs during EEG study: None  Technical aspects: This EEG study was done with scalp electrodes positioned according to the 10-20 International system of electrode placement. Electrical activity was acquired at a sampling rate of 500Hz  and reviewed with a high frequency filter of 70Hz  and a low frequency filter of 1Hz . EEG data were recorded continuously and digitally stored.   DESCRIPTION: No clear posterior dominant rhythm was seen. EEG showed continuous generalized 3-7hz  theta-delta slowing.  Hyperventilation and photic stimulation were not performed.  ABNORMALITY - Continuous slow, generalized  IMPRESSION: This study is suggestive of moderate diffuse encephalopathy, non specific to etiology. No seizures or epileptiform discharges were seen throughout the recording.  Arlisha Patalano Barbra Sarks

## 2019-03-11 NOTE — Progress Notes (Signed)
PCXR completed. Patient did let CNA take vitals. Lab in was unable to get his labs MD aware. Patient lying in bed relaxing at present with no c/o's. Urine in BSD is light yellow dilute clear.

## 2019-03-11 NOTE — Progress Notes (Signed)
Been back to room multiple times since last room check patient attempting get up unassisted wanted to go back to bed,placed him back in bed. Went back in room due to bed alarm going off he was sitting on the side of the bed with rail down told me he wanted to sit up and drink his coffee was cold,writer got him more warmed blankets reset bed alarm. Instructed him not to get up unassisted. He reported to me did not like the CNA reported "she was not worth a shit last night," writer explained CNA did not work last night he stated did not care did not like her.

## 2019-03-11 NOTE — Progress Notes (Signed)
EEG complete - results pending 

## 2019-03-11 NOTE — Progress Notes (Signed)
Pt A&O x4 at this time and agreeable to most interventions. Pt c/o 7/10 arthritic pain. MD paged.  Diabetes Coordinator note also suggest increasing q4 SSI to 0-15 unit scale.

## 2019-03-11 NOTE — Progress Notes (Signed)
Beeped on call speech therapist to see when they are going to see patient today await response.

## 2019-03-11 NOTE — Progress Notes (Signed)
In to check on patient bed alarm went off patient had stepped over bedrails was urinating in the trash can large amount reported could not find his urinal he reported accidentally knocked it off the bed instead of calling for help he got up unassisted. Writer reminded him he needed to use call light if needs help. Offered the condom cath to him so he would not have to fiddle with the urinal he declined reported  Could use the urinal ok just accidentally dropped it. Writer sat him chair at bedside with chair alarm pad placed in the chair call light and needed items placed in his reach. Lab in to draw his labs. No further changes noted.

## 2019-03-12 LAB — GLUCOSE, CAPILLARY
Glucose-Capillary: 132 mg/dL — ABNORMAL HIGH (ref 70–99)
Glucose-Capillary: 132 mg/dL — ABNORMAL HIGH (ref 70–99)
Glucose-Capillary: 132 mg/dL — ABNORMAL HIGH (ref 70–99)
Glucose-Capillary: 159 mg/dL — ABNORMAL HIGH (ref 70–99)
Glucose-Capillary: 184 mg/dL — ABNORMAL HIGH (ref 70–99)
Glucose-Capillary: 229 mg/dL — ABNORMAL HIGH (ref 70–99)
Glucose-Capillary: 47 mg/dL — ABNORMAL LOW (ref 70–99)
Glucose-Capillary: 59 mg/dL — ABNORMAL LOW (ref 70–99)
Glucose-Capillary: 65 mg/dL — ABNORMAL LOW (ref 70–99)

## 2019-03-12 LAB — T4, FREE: Free T4: 1.11 ng/dL (ref 0.61–1.12)

## 2019-03-12 LAB — CBC
HCT: 32.5 % — ABNORMAL LOW (ref 39.0–52.0)
Hemoglobin: 11.6 g/dL — ABNORMAL LOW (ref 13.0–17.0)
MCH: 33.3 pg (ref 26.0–34.0)
MCHC: 35.7 g/dL (ref 30.0–36.0)
MCV: 93.4 fL (ref 80.0–100.0)
Platelets: 145 10*3/uL — ABNORMAL LOW (ref 150–400)
RBC: 3.48 MIL/uL — ABNORMAL LOW (ref 4.22–5.81)
RDW: 14.8 % (ref 11.5–15.5)
WBC: 12.5 10*3/uL — ABNORMAL HIGH (ref 4.0–10.5)
nRBC: 0 % (ref 0.0–0.2)

## 2019-03-12 LAB — URINALYSIS, ROUTINE W REFLEX MICROSCOPIC
Bilirubin Urine: NEGATIVE
Glucose, UA: >=500 mg/dL — AB
Hgb urine dipstick: NEGATIVE
Ketones, ur: NEGATIVE mg/dL
Nitrite: NEGATIVE
Protein, ur: NEGATIVE mg/dL
Specific Gravity, Urine: 1.006 (ref 1.005–1.030)
pH: 6 (ref 5.0–8.0)

## 2019-03-12 LAB — LACTIC ACID, PLASMA
Lactic Acid, Venous: 2.8 mmol/L (ref 0.5–1.9)
Lactic Acid, Venous: 3.3 mmol/L (ref 0.5–1.9)

## 2019-03-12 LAB — COMPREHENSIVE METABOLIC PANEL
ALT: 28 U/L (ref 0–44)
AST: 26 U/L (ref 15–41)
Albumin: 1.9 g/dL — ABNORMAL LOW (ref 3.5–5.0)
Alkaline Phosphatase: 247 U/L — ABNORMAL HIGH (ref 38–126)
Anion gap: 7 (ref 5–15)
BUN: 5 mg/dL — ABNORMAL LOW (ref 8–23)
CO2: 27 mmol/L (ref 22–32)
Calcium: 7.6 mg/dL — ABNORMAL LOW (ref 8.9–10.3)
Chloride: 107 mmol/L (ref 98–111)
Creatinine, Ser: 0.53 mg/dL — ABNORMAL LOW (ref 0.61–1.24)
GFR calc Af Amer: >60 mL/min (ref >60)
GFR calc non Af Amer: >60 mL/min (ref >60)
Glucose, Bld: 120 mg/dL — ABNORMAL HIGH (ref 70–99)
Potassium: 2.9 mmol/L — ABNORMAL LOW (ref 3.5–5.1)
Sodium: 141 mmol/L (ref 135–145)
Total Bilirubin: 0.6 mg/dL (ref 0.3–1.2)
Total Protein: 4.9 g/dL — ABNORMAL LOW (ref 6.5–8.1)

## 2019-03-12 LAB — RAPID URINE DRUG SCREEN, HOSP PERFORMED
Amphetamines: NOT DETECTED
Barbiturates: NOT DETECTED
Benzodiazepines: NOT DETECTED
Cocaine: NOT DETECTED
Opiates: NOT DETECTED
Tetrahydrocannabinol: NOT DETECTED

## 2019-03-12 LAB — MAGNESIUM: Magnesium: 1.8 mg/dL (ref 1.7–2.4)

## 2019-03-12 MED ORDER — RESOURCE THICKENUP CLEAR PO POWD
ORAL | Status: DC | PRN
  Filled 2019-03-12: qty 125

## 2019-03-12 MED ORDER — POTASSIUM CHLORIDE CRYS ER 20 MEQ PO TBCR
40.0000 meq | EXTENDED_RELEASE_TABLET | ORAL | Status: AC
  Filled 2019-03-12: qty 2

## 2019-03-12 MED ORDER — DEXTROSE 50 % IV SOLN
25.0000 g | INTRAVENOUS | Status: AC
  Administered 2019-03-12: 25 g via INTRAVENOUS

## 2019-03-12 MED ORDER — ENSURE ENLIVE PO LIQD
237.0000 mL | Freq: Two times a day (BID) | ORAL | Status: DC
  Administered 2019-03-13 – 2019-03-19 (×9): 237 mL via ORAL

## 2019-03-12 MED ORDER — DEXTROSE 50 % IV SOLN
INTRAVENOUS | Status: AC
  Filled 2019-03-12: qty 50

## 2019-03-12 MED ORDER — POTASSIUM CHLORIDE CRYS ER 20 MEQ PO TBCR
40.0000 meq | EXTENDED_RELEASE_TABLET | ORAL | Status: AC
  Administered 2019-03-12 (×2): 40 meq via ORAL
  Filled 2019-03-12 (×2): qty 2

## 2019-03-12 MED ORDER — INSULIN DETEMIR 100 UNIT/ML ~~LOC~~ SOLN
5.0000 [IU] | Freq: Two times a day (BID) | SUBCUTANEOUS | Status: DC
  Administered 2019-03-12 – 2019-03-14 (×4): 5 [IU] via SUBCUTANEOUS
  Filled 2019-03-12 (×5): qty 0.05

## 2019-03-12 MED ORDER — SODIUM CHLORIDE 0.9 % IV BOLUS
1000.0000 mL | Freq: Once | INTRAVENOUS | Status: AC
  Administered 2019-03-12: 1000 mL via INTRAVENOUS

## 2019-03-12 NOTE — Progress Notes (Signed)
Inpatient Diabetes Program Recommendations  AACE/ADA: New Consensus Statement on Inpatient Glycemic Control   Target Ranges:  Prepandial:   less than 140 mg/dL      Peak postprandial:   less than 180 mg/dL (1-2 hours)      Critically ill patients:  140 - 180 mg/dL  Results for REYNOL, SAMONS (MRN [id number]) as of 03/12/2019 14:33  Ref. Range 03/12/2019 00:33 03/12/2019 04:05 03/12/2019 04:35 03/12/2019 07:47 03/12/2019 08:12 03/12/2019 08:42 03/12/2019 12:25  Glucose-Capillary Latest Ref Range: 70 - 99 mg/dL 132 (H) 47 (L) 159 (H) 59 (L) 65 (L) 132 (H) 229 (H)  Novolog 3 units  Results for CAIN, PANTHER (MRN [id number]) as of 03/12/2019 14:33  Ref. Range 03/11/2019 00:04 03/11/2019 02:55 03/11/2019 11:42 03/11/2019 15:58 03/11/2019 20:46  Glucose-Capillary Latest Ref Range: 70 - 99 mg/dL 79 71 262 (H) 519 (HH)  Novolog 19 units   Lantus 5 units@17 :03 300 (H)  Novolog 5 units    Review of Glycemic Control  Diabetes history: DM2 Outpatient Diabetes medications: Glipizide 10 mg QAM, Levemir 15 units BID, Novolog 5 units with breakfast, Novolog 10 units with lunch and supper Current orders for Inpatient glycemic control: Novolog 0-9 units Q4H; Solumedrol 40 mg Q8H  NOTE: Went by to talk with patient regarding DM control and outpatient regimen. Patient sitting up in bed with lunch tray on bedside table over the bed. Patient had food all over him and was pulling off his gown. Patient's speech is garbled at times but writer able to understand speech most of the time and patient drifting off to sleep during discussion. Assisted patient to change gown and clean up food he had on him and in his bed. Patient stated his glucose at home is "okay" and he denies frequent hypoglycemia. Patient states he can recognize when his glucose is low and he does not feel his glucose is low at this time. Noted last CBG 229 mg/dl at 12:25 today. Patient not able to provide much information on what he takes at home for DM.  Informed patient that our team will follow up with him regarding DM control when he is more alert.     Thanks, Barnie Alderman, RN, MSN, CDE Diabetes Coordinator Inpatient Diabetes Program [phone] (Team Pager from 8am to 5pm)

## 2019-03-12 NOTE — Progress Notes (Signed)
Patient is agitated, refusing labs and telemetry. Had a hypoglycemic event this am as well, snacks given, will recheck

## 2019-03-12 NOTE — Progress Notes (Signed)
Sister has called back and been updated x3

## 2019-03-12 NOTE — Progress Notes (Signed)
Family member, Brentan Mutch, called and updated

## 2019-03-12 NOTE — Progress Notes (Signed)
SLP Cancellation Note  Patient Details Name: Michael [name] MRN: [id number] DOB: 1955-08-19   Cancelled treatment:       Reason Eval/Treat Not Completed: Fatigue/lethargy limiting ability to participate(Case was discussed with RN and she indicated that the pt is now lethargic after having received Ativan due to agitation. SLP will follow up on 03/13/19 unless otherwise contacted by RN today.)  [name] I. Hardin Negus, [place], [place] Office number [phone] Pager [place] 03/12/2019, 11:41 AM

## 2019-03-12 NOTE — Progress Notes (Signed)
CBG = 300 at 20:46. Novolog 5 units given. Pt had a late dinner and was constantly requesting snacks after dinner. CBG = 132 @ 132. Novolog held. CBG = 47 at 04:05. Pt very drowsy but irritable, so 1 amp D50 given. CBG = 159 upon reassessment 15 minutes later.

## 2019-03-12 NOTE — Progress Notes (Signed)
Initial Nutrition Assessment  DOCUMENTATION CODES:   Non-severe (moderate) malnutrition in context of chronic illness, Underweight  INTERVENTION:   -Downgrade diet to dysphagia 3 diet with thin liquids -Ensure Enlive po BID, each supplement provides 350 kcal and 20 grams of protein -MVI with minerals daily  NUTRITION DIAGNOSIS:   Moderate Malnutrition related to chronic illness(pancreatitis) as evidenced by moderate fat depletion, moderate muscle depletion, mild muscle depletion, mild fat depletion, percent weight loss.  GOAL:   Patient will meet greater than or equal to 90% of their needs  MONITOR:   PO intake, Supplement acceptance, Labs, Weight trends, Skin, I & O's  REASON FOR ASSESSMENT:   Other (Comment)    ASSESSMENT:   Michael [name] is a 64 year old male, PMH of alcohol dependence, tobacco abuse, type II DM/IDDM, HTN, GERD, hepatitis B, pancreatitis, bipolar disorder, prior seizure disorder, recent hospital admission 02/26/2019 when he had presented with seizure, hyperglycemia, required intubation for airway protection, extubated the next day and then left the hospital [place], presented to the [facility] ED via EMS with history of witnessed seizure at home.  Patient unable to provide any history due to altered mental status.  History obtained by discussing with EDP and bedside RN.  Family reportedly called EMS because patient had a seizure but unable to provide details regarding type of seizure or duration.  When EMS arrived there CBGs found to be in the 40s, unable to get IV access and hence given IM glucagon.  On arrival to the ED CBGs in the 200s.  He was hypothermic in 94 F rectally and unresponsive, placed on Bair hugger.  Pt admitted with acute metabolic encephalopathy.   Reviewed I/O's: +948 ml x 24 hours and +1.6 L since admission  UOP: 1.6 L x 24 hours  Per awaiting SLP evaluation. RN and MD requesting soft textured foods.   Pt very  lethargic at time of visit. Per chart review, pt has been agitated and refusing care. Lunch tray at bedside, which was unattempted. Per doc flowsheets, meal completion 100%.  Reviewed wt hx; noted pt has experienced a 22% wt loss over the past 6 months, which is significant for time frame.   Medications reviewed and include thiamine, IV solu-medrol, lasix and lactulose.   Lab Results  Component Value Date   HGBA1C 14.8 (H) 03/11/2019   PTA DM medications are 10 mg glipizide q AM, 5 units insulin aspart with breakfast, and 10 units insulin aspart for lunch and dinner,  and 15 units insulin BID .   Labs reviewed: K: 2.9, CBGS: 47-159 (inpatient orders for glycemic control are 0-9 unit insulin aspart every 4 hours).   NUTRITION - FOCUSED PHYSICAL EXAM:    Most Recent Value  Orbital Region  Mild depletion  Upper Arm Region  Moderate depletion  Thoracic and Lumbar Region  Unable to assess  Buccal Region  Mild depletion  Temple Region  Moderate depletion  Clavicle Bone Region  Moderate depletion  Clavicle and Acromion Bone Region  Moderate depletion  Scapular Bone Region  Moderate depletion  Dorsal Hand  Mild depletion  Patellar Region  Unable to assess  Anterior Thigh Region  Unable to assess  Posterior Calf Region  Unable to assess  Edema (RD Assessment)  None  Hair  Reviewed  Eyes  Reviewed  Mouth  Reviewed  Skin  Reviewed  Nails  Reviewed       Diet Order:   Diet Order  Diet heart healthy/carb modified Room service appropriate? Yes; Fluid consistency: Nectar Thick  Diet effective now              EDUCATION NEEDS:   No education needs have been identified at this time  Skin:  Skin Assessment: Reviewed RN Assessment  Last BM:  03/11/19  Height:   Ht Readings from Last 1 Encounters:  03/10/19 5\' 4"  (1.626 m)    Weight:   Wt Readings from Last 1 Encounters:  03/12/19 47.7 kg    Ideal Body Weight:  59.1 kg  BMI:  Body mass index is 18.05  kg/m.  Estimated Nutritional Needs:   Kcal:  1500-1700  Protein:  75-90 grams  Fluid:  > 1.5 L    Loistine Chance, RD, LDN, [place] Registered Dietitian II Certified Diabetes Care and Education Specialist Please refer to [facility] for RD and/or RD on-call/weekend/after hours pager

## 2019-03-12 NOTE — Progress Notes (Signed)
PROGRESS NOTE  Michael [name] [id number] DOB: [birth year] DOA: 03/10/2019 PCP: Helane Rima, MD  Brief History   64 year old with history of alcohol abuse, tobacco use, DM2, HTN, GERD, hepatitis B, bipolar disorder, pancreatitis, recent seizures requiring intubation about 2 weeks ago ended up leaving [place] presents to the hospital after having witnessed seizure at home.  He was noted to be hypoglycemic, hypothermic, elevated lactate.  CT head was negative.  Neurology team was consulted.  Neurology feels that the patient's recurrent seizures are due to his hypoglycemia.  EEG did not reveal epilleptiform abnormalities and they do not recommend epileptic therapy or further evaluation unless he has an unprovoked seizures in the futures.   Consultants  . Neurology  Procedures  . EEG - No epileptiform abnormality. No epileptic therapy.   Antibiotics   Anti-infectives (From admission, onward)   Start     Dose/Rate Route Frequency Ordered Stop   03/10/19 2200  piperacillin-tazobactam (ZOSYN) IVPB 3.375 g     3.375 g 12.5 mL/hr over 240 Minutes Intravenous Every 8 hours 03/10/19 1858     03/10/19 1430  piperacillin-tazobactam (ZOSYN) IVPB 3.375 g     3.375 g 100 mL/hr over 30 Minutes Intravenous  Once 03/10/19 1416 03/10/19 1740    .  Subjective  The patient is resting quietly. No new complaints.  Objective   Vitals:  Vitals:   03/12/19 0514 03/12/19 0727  BP: 96/61   Pulse: 67   Resp: 20   Temp: 97.6 F (36.4 C)   SpO2: 98% 94%   Exam:  Constitutional:  . The patient is awake, alert, and oriented x 3. No acute distress. Respiratory:  . No increased work of breathing. . No wheezes, rales, or rhonchi . No tactile fremitus Cardiovascular:  . Regular rate and rhythm . No murmurs, ectopy, or gallups. . No lateral PMI. No thrills. Abdomen:  . Abdomen is soft, non-tender, non-distended . No hernias, masses, or organomegaly . Normoactive bowel sounds.    Musculoskeletal:  . No cyanosis, clubbing, or edema Skin:  . No rashes, lesions, ulcers . palpation of skin: no induration or nodules Neurologic:  . CN 2-12 intact . Sensation all 4 extremities intact Psychiatric:  . Mental status: The patient is awake and alert.   I have personally reviewed the following:   Today's Data  . Vitals, BMP, Lactic acid, CBC  Micro Data  . Blood cultures: no growth x 2 days. . Urine cultures: no growth   Imaging  . CXR Right lower lobe opacity .  Scheduled Meds: . budesonide (PULMICORT) nebulizer solution  0.5 mg Nebulization BID  . dextrose      . enoxaparin (LOVENOX) injection  40 mg Subcutaneous Q24H  . folic acid  1 mg Oral Daily  . furosemide  40 mg Intravenous Once  . insulin aspart  0-9 Units Subcutaneous Q4H  . lactulose  20 g Oral BID  . methylPREDNISolone (SOLU-MEDROL) injection  40 mg Intravenous Q12H  . multivitamin with minerals  1 tablet Oral Daily  . sodium chloride flush  3 mL Intravenous Q12H  . thiamine  100 mg Oral Daily   Or  . thiamine  100 mg Intravenous Daily   Continuous Infusions: . sodium chloride 250 mL (03/11/19 2226)  . piperacillin-tazobactam (ZOSYN)  IV 3.375 g (03/12/19 1424)    Principal Problem:   Acute metabolic encephalopathy Active Problems:   Type 2 diabetes mellitus with peripheral neuropathy (HCC)   Chronic alcoholic pancreatitis ([place])  Protein-calorie malnutrition, severe   Tobacco abuse   Hypoglycemia   Alcohol use disorder, severe, dependence (HCC)   Seizure ([place])   Hypothermia   Sepsis ([place])   LOS: 2 days   A & P  Acute respiratory distress with coughing due to acute COPD exacerbation: The patient is receiving aggressive bronchodilators and solu-medrol.  Incentive spirometer and flutter valve are being utilized. He is COVID - 19 negative.  Aspiration pneumonia: Resulting in COPD exacerbation. Persistent Right lower lobe opacity due to aspiration pneumonia. The patient is on IV  Zosyn and aspiration precautions. Procalcitonin positive at 1.85.   Sepsis: Lactic acidosis at Lactic Acid at 2.8, hypotension, and positive procalcitonin. Blood cultures x 2 have had no growth. Pt is receiving IV Zosyn. Will give fluid bolus and discontinue lasix. Doubt that volume overload is significant.  Mild Volume Overload: BMP is somewhat elevated at 229.5. No echocardiogram available to guide therapy. Will order.  Severe recurrent seizures: Due to hypoglycemia per neurology. Pt also is dependent upon alcohol. Neurology does not see epilleptiform abnormalities on EEG. They do not recommend continuation of epilepsy medication. They also do not recommend further evaluation unless the patient presents with unprovoked seizures. Keppra has been discontinued.  Postictal period: Due to recurrent seizures as above. Encephalopathy related to post-ictal period is waning.   Alcohol dependence and abuse: CIWA protocol for withdrawal.   Euthyroid Sick Syndrome: Low TSH. Normal FT4. FT3 pending.  Diabetes mellitus type 2, poorly controlled due to hyperglycemia/hypoglycemia: Glucoses to be managed with FSBS and SSI. Oral glypizide has been held. Pt will be continued on low dose levemir once he is reliably taking PO. Monitor.   Hypothermia: Resolving. Due to hypoglycemia/sepsis. Monitor. 34.3  Prolonged QTC: Admission QTC 646.  Improved on repeat EKG after supplementing electrolytes.  Concern for hepatic encephalopathy: Due to patient's ETOH history and mildly elevated ammonium level. Will stop lactulose as level of alertness is improved.  Hypokalemia/hypomagnesemia: Supplement and monitor.  Daily tobacco use: Nicotine patch  Essential hypertension: Blood pressures actually low due to sepsis. Antihypertensives held. Monitor.  GERD: Continue IV PPI  I have seen and examined this patient myself. I have spent 38 minutes in his evaluation and care.  DVT prophylaxis: Lovenox Code Status:  Full Family Communication: None Disposition Plan:              Patient from home. Anticipate discharge to home. Barriers include clinical improvement in  lactic acidosis and hypotension.   Shanise Balch, DO [facility] Direct contact: see [email or web address]  7PM-7AM contact night coverage as above 03/12/2019, 3:03 PM  LOS: 2 days

## 2019-03-13 ENCOUNTER — Inpatient Hospital Stay (HOSPITAL_COMMUNITY): Payer: Medicare (Managed Care)

## 2019-03-13 DIAGNOSIS — R0603 Acute respiratory distress: Secondary | ICD-10-CM

## 2019-03-13 LAB — COMPREHENSIVE METABOLIC PANEL
ALT: 31 U/L (ref 0–44)
AST: 26 U/L (ref 15–41)
Albumin: 2 g/dL — ABNORMAL LOW (ref 3.5–5.0)
Alkaline Phosphatase: 231 U/L — ABNORMAL HIGH (ref 38–126)
Anion gap: 7 (ref 5–15)
BUN: 9 mg/dL (ref 8–23)
CO2: 25 mmol/L (ref 22–32)
Calcium: 7.8 mg/dL — ABNORMAL LOW (ref 8.9–10.3)
Chloride: 109 mmol/L (ref 98–111)
Creatinine, Ser: 0.48 mg/dL — ABNORMAL LOW (ref 0.61–1.24)
GFR calc Af Amer: >60 mL/min (ref >60)
GFR calc non Af Amer: >60 mL/min (ref >60)
Glucose, Bld: 95 mg/dL (ref 70–99)
Potassium: 3.8 mmol/L (ref 3.5–5.1)
Sodium: 141 mmol/L (ref 135–145)
Total Bilirubin: 0.6 mg/dL (ref 0.3–1.2)
Total Protein: 5 g/dL — ABNORMAL LOW (ref 6.5–8.1)

## 2019-03-13 LAB — MAGNESIUM: Magnesium: 1.8 mg/dL (ref 1.7–2.4)

## 2019-03-13 LAB — GLUCOSE, CAPILLARY
Glucose-Capillary: 156 mg/dL — ABNORMAL HIGH (ref 70–99)
Glucose-Capillary: 147 mg/dL — ABNORMAL HIGH (ref 70–99)
Glucose-Capillary: 82 mg/dL (ref 70–99)
Glucose-Capillary: 165 mg/dL — ABNORMAL HIGH (ref 70–99)
Glucose-Capillary: 331 mg/dL — ABNORMAL HIGH (ref 70–99)
Glucose-Capillary: 229 mg/dL — ABNORMAL HIGH (ref 70–99)

## 2019-03-13 LAB — CBC
HCT: 38.7 % — ABNORMAL LOW (ref 39.0–52.0)
Hemoglobin: 13.3 g/dL (ref 13.0–17.0)
MCH: 32.8 pg (ref 26.0–34.0)
MCHC: 34.4 g/dL (ref 30.0–36.0)
MCV: 95.3 fL (ref 80.0–100.0)
Platelets: 162 10*3/uL (ref 150–400)
RBC: 4.06 MIL/uL — ABNORMAL LOW (ref 4.22–5.81)
RDW: 14.8 % (ref 11.5–15.5)
WBC: 10.7 10*3/uL — ABNORMAL HIGH (ref 4.0–10.5)
nRBC: 0 % (ref 0.0–0.2)

## 2019-03-13 LAB — ECHOCARDIOGRAM COMPLETE
Height: 64 in
Weight: 1689.61 oz

## 2019-03-13 LAB — URINE CULTURE: Culture: NO GROWTH

## 2019-03-13 LAB — LACTIC ACID, PLASMA
Lactic Acid, Venous: 2.2 mmol/L (ref 0.5–1.9)
Lactic Acid, Venous: 2.6 mmol/L (ref 0.5–1.9)

## 2019-03-13 LAB — T3, FREE: T3, Free: 1.6 pg/mL — ABNORMAL LOW (ref 2.0–4.4)

## 2019-03-13 MED ORDER — SODIUM CHLORIDE 0.9 % IV BOLUS
1000.0000 mL | Freq: Once | INTRAVENOUS | Status: AC
  Administered 2019-03-13: 1000 mL via INTRAVENOUS

## 2019-03-13 MED ORDER — TRAMADOL HCL 50 MG PO TABS
50.0000 mg | ORAL_TABLET | Freq: Once | ORAL | Status: AC
  Administered 2019-03-13: 50 mg via ORAL
  Filled 2019-03-13: qty 1

## 2019-03-13 MED ORDER — PANTOPRAZOLE SODIUM 40 MG PO TBEC
40.0000 mg | DELAYED_RELEASE_TABLET | Freq: Every day | ORAL | Status: DC
  Administered 2019-03-13 – 2019-03-19 (×7): 40 mg via ORAL
  Filled 2019-03-13 (×7): qty 1

## 2019-03-13 NOTE — Evaluation (Signed)
Physical Therapy Evaluation Patient Details Name: Michael [name] MRN: [id number] DOB: 1955/06/01 Today's Date: 03/13/2019   History of Present Illness  Pt is a 64 y/o male admitted secondary to COPD exacerbation and sepsis. Pt also with seizure like activity prior to admission. CT of head negative for acute abnormality. PMH includes DM, alcohol abuse, tobacco use, HTN, hepatitis B, bipolar disorder, and pancreatitis.   Clinical Impression  Pt admitted secondary to problem above with deficits below. Pt requiring mod A to stand and take side steps at EOB. Pt with posterior lean in standing. Feel pt would benefit from SNF level therapies at d/c given current deficits. Will continue to follow acutely to maximize functional mobility independence and safety.     Follow Up Recommendations SNF;Supervision/Assistance - 24 hour    Equipment Recommendations  None recommended by PT    Recommendations for Other Services       Precautions / Restrictions Precautions Precautions: Fall Restrictions Weight Bearing Restrictions: No      Mobility  Bed Mobility Overal bed mobility: Needs Assistance Bed Mobility: Supine to Sit;Sit to Supine     Supine to sit: Min assist Sit to supine: Min assist   General bed mobility comments: Min A for trunk elevation and LE assist. Increased time to perform.   Transfers Overall transfer level: Needs assistance Equipment used: Rolling walker (2 wheeled) Transfers: Sit to/from Stand Sit to Stand: Mod assist         General transfer comment: Mod A for lift assist and steadying. Pt with posterior lean upon standing.   Ambulation/Gait Ambulation/Gait assistance: Mod assist   Assistive device: Rolling walker (2 wheeled)       General Gait Details: Mod A for steadying assist to take side steps at EOB. Pt with posterior lean. Pt refusing further mobility.   Stairs            Wheelchair Mobility    Modified Rankin (Stroke Patients Only)        Balance Overall balance assessment: Needs assistance Sitting-balance support: No upper extremity supported;Feet supported Sitting balance-Leahy Scale: Fair     Standing balance support: Bilateral upper extremity supported;During functional activity Standing balance-Leahy Scale: Poor Standing balance comment: Reliant on UE and external support. Pt with posterior lean                             Pertinent Vitals/Pain Pain Assessment: No/denies pain    Home Living Family/patient expects to be discharged to:: Private residence Living Arrangements: Other relatives(sister) Available Help at Discharge: Family Type of Home: House Home Access: Stairs to enter Entrance Stairs-Rails: Right Entrance Stairs-Number of Steps: 3 Home Layout: One level Home Equipment: None      Prior Function Level of Independence: Independent with assistive device(s)         Comments: Initially reporting he does not use AD, then stated the walker helped to prevent falls.      Hand Dominance        Extremity/Trunk Assessment   Upper Extremity Assessment Upper Extremity Assessment: Defer to OT evaluation    Lower Extremity Assessment Lower Extremity Assessment: Generalized weakness    Cervical / Trunk Assessment Cervical / Trunk Assessment: Kyphotic  Communication   Communication: Expressive difficulties(pt very soft spoken with mumbled speech)  Cognition Arousal/Alertness: Lethargic Behavior During Therapy: Flat affect Overall Cognitive Status: No family/caregiver present to determine baseline cognitive functioning  General Comments: Pt falling asleep during session, however, easily awoken. Pt difficult to understand at times.       General Comments      Exercises     Assessment/Plan    PT Assessment Patient needs continued PT services  PT Problem List Decreased strength;Decreased balance;Decreased mobility;Decreased  knowledge of precautions;Decreased safety awareness;Decreased knowledge of use of DME;Decreased cognition       PT Treatment Interventions Stair training;Gait training;Functional mobility training;Therapeutic activities;Therapeutic exercise;DME instruction;Cognitive remediation;Patient/family education;Balance training    PT Goals (Current goals can be found in the Care Plan section)  Acute Rehab PT Goals Patient Stated Goal: none stated PT Goal Formulation: With patient Time For Goal Achievement: 03/27/19 Potential to Achieve Goals: Good    Frequency Min 2X/week   Barriers to discharge        Co-evaluation               AM-PAC PT "6 Clicks" Mobility  Outcome Measure Help needed turning from your back to your side while in a flat bed without using bedrails?: A Little Help needed moving from lying on your back to sitting on the side of a flat bed without using bedrails?: A Little Help needed moving to and from a bed to a chair (including a wheelchair)?: A Lot Help needed standing up from a chair using your arms (e.g., wheelchair or bedside chair)?: A Lot Help needed to walk in hospital room?: A Lot Help needed climbing 3-5 steps with a railing? : A Lot 6 Click Score: 14    End of Session Equipment Utilized During Treatment: Gait belt Activity Tolerance: Patient tolerated treatment well Patient left: in bed;with call bell/phone within reach;with bed alarm set;with nursing/sitter in room Nurse Communication: Mobility status PT Visit Diagnosis: Unsteadiness on feet (R26.81);Muscle weakness (generalized) (M62.81)    Time: [id number] PT Time Calculation (min) (ACUTE ONLY): 18 min   Charges:   PT Evaluation $PT Eval Moderate Complexity: 1 Mod          Reuel Derby, PT, DPT  Acute Rehabilitation Services  Pager: [phone] Office: [phone]   Rudean Hitt 03/13/2019, 11:25 AM

## 2019-03-13 NOTE — Progress Notes (Signed)
Echocardiogram 2D Echocardiogram has been performed.  Michael [name] 03/13/2019, 2:05 PM

## 2019-03-13 NOTE — Progress Notes (Addendum)
PROGRESS NOTE  Michael [name] [id number] DOB: May 25, 1955 DOA: 03/10/2019 PCP: Helane Rima, MD  Brief History   64 year old with history of alcohol abuse, tobacco use, DM2, HTN, GERD, hepatitis B, bipolar disorder, pancreatitis, recent seizures requiring intubation about 2 weeks ago ended up leaving [place] presents to the hospital after having witnessed seizure at home.  He was noted to be hypoglycemic, hypothermic, elevated lactate.  CT head was negative.  Neurology team was consulted.  Neurology feels that the patient's recurrent seizures are due to his hypoglycemia.  EEG did not reveal epilleptiform abnormalities and they do not recommend epileptic therapy or further evaluation unless he has an unprovoked seizures in the futures.   The patient has been evaluated by PT/OT. They have recommended SNF placement. TOC consulted for assistance.  Consultants  . Neurology  Procedures  . EEG - No epileptiform abnormality. No epileptic therapy.   Antibiotics   Anti-infectives (From admission, onward)   Start     Dose/Rate Route Frequency Ordered Stop   03/10/19 2200  piperacillin-tazobactam (ZOSYN) IVPB 3.375 g     3.375 g 12.5 mL/hr over 240 Minutes Intravenous Every 8 hours 03/10/19 1858     03/10/19 1430  piperacillin-tazobactam (ZOSYN) IVPB 3.375 g     3.375 g 100 mL/hr over 30 Minutes Intravenous  Once 03/10/19 1416 03/10/19 1740     Subjective  The patient is resting quietly. No new complaints.  Objective   Vitals:  Vitals:   03/13/19 0740 03/13/19 1107  BP:  107/77  Pulse:  66  Resp:  15  Temp:  (!) 97.5 F (36.4 C)  SpO2: 97% 100%   Exam:  Constitutional:  . The patient is awake, alert, and oriented x 3. No acute distress. Respiratory:  . No increased work of breathing. . No wheezes, rales, or rhonchi . No tactile fremitus Cardiovascular:  . Regular rate and rhythm . No murmurs, ectopy, or gallups. . No lateral PMI. No thrills. Abdomen:    . Abdomen is soft, non-tender, non-distended . No hernias, masses, or organomegaly . Normoactive bowel sounds.  Musculoskeletal:  . No cyanosis, clubbing, or edema Skin:  . No rashes, lesions, ulcers . palpation of skin: no induration or nodules Neurologic:  . CN 2-12 intact . Sensation all 4 extremities intact Psychiatric:  . Mental status: The patient is awake and alert.   I have personally reviewed the following:   Today's Data  . Vitals, BMP, Lactic acid, CBC  Micro Data  . Blood cultures: no growth x 2 days. . Urine cultures: no growth   Imaging  . CXR Right lower lobe opacity .  Scheduled Meds: . budesonide (PULMICORT) nebulizer solution  0.5 mg Nebulization BID  . enoxaparin (LOVENOX) injection  40 mg Subcutaneous Q24H  . feeding supplement (ENSURE ENLIVE)  237 mL Oral BID BM  . folic acid  1 mg Oral Daily  . insulin aspart  0-9 Units Subcutaneous Q4H  . insulin detemir  5 Units Subcutaneous BID  . lactulose  20 g Oral BID  . methylPREDNISolone (SOLU-MEDROL) injection  40 mg Intravenous Q12H  . multivitamin with minerals  1 tablet Oral Daily  . sodium chloride flush  3 mL Intravenous Q12H  . thiamine  100 mg Oral Daily   Or  . thiamine  100 mg Intravenous Daily   Continuous Infusions: . sodium chloride 250 mL (03/11/19 2226)  . piperacillin-tazobactam (ZOSYN)  IV 3.375 g (03/13/19 [id number])    Principal Problem:  Acute metabolic encephalopathy Active Problems:   Type 2 diabetes mellitus with peripheral neuropathy (HCC)   Chronic alcoholic pancreatitis (HCC)   Protein-calorie malnutrition, severe   Tobacco abuse   Hypoglycemia   Alcohol use disorder, severe, dependence ([place])   Seizure ([place])   Hypothermia   Sepsis ([place])   LOS: 3 days   A & P  Acute respiratory distress with coughing due to acute COPD exacerbation: The patient is receiving aggressive bronchodilators and solu-medrol.  Incentive spirometer and flutter valve are being utilized. He is  COVID - 19 negative.  Aspiration pneumonia: Resulting in COPD exacerbation. Persistent Right lower lobe opacity due to aspiration pneumonia. The patient is on IV Zosyn and aspiration precautions. Procalcitonin positive at 1.85.   Sepsis: Lactic acidosis at Lactic Acid at 3.3, hypotension, and positive procalcitonin. Blood cultures x 2 have had no growth. Pt is receiving IV Zosyn. Will give fluid bolus and discontinue lasix. Doubt that volume overload is significant. IV fluid bolus.  Mild Volume Overload: BMP is somewhat elevated at 229.5. No echocardiogram available to guide therapy. Will order.  Severe recurrent seizures: Due to hypoglycemia per neurology. Pt also is dependent upon alcohol. Neurology does not see epilleptiform abnormalities on EEG. They do not recommend continuation of epilepsy medication. They also do not recommend further evaluation unless the patient presents with unprovoked seizures. Keppra has been discontinued.  Postictal period: Due to recurrent seizures as above. Encephalopathy related to post-ictal period is waning.   Alcohol dependence and abuse: CIWA protocol for withdrawal.   Euthyroid Sick Syndrome: Low TSH. Normal FT4 with mildly low FT3.   Diabetes mellitus type 2, poorly controlled due to hyperglycemia/hypoglycemia: Glucoses to be managed with FSBS and SSI as well as Lantus 5 units bid. Oral glypizide has been held.Glucoses for the last 24 hours have run between 82 and 184.   Hypothermia: Resolved. Due to hypoglycemia/sepsis. Monitor. 36.4 this am.  Prolonged QTC: Admission QTC 646.  Improved on repeat EKG after supplementing electrolytes.  Concern for hepatic encephalopathy: Due to patient's ETOH history and mildly elevated ammonium level. Will stop lactulose as level of alertness is improved.  Hypokalemia/hypomagnesemia: Resolved. Monitor..  Daily tobacco use: Nicotine patch  Essential hypertension: Blood pressures actually low due to sepsis.  Antihypertensives held. Monitor.  GERD: Continue PPI as oral.  I have seen and examined this patient myself. I have spent 32 minutes in his evaluation and care.  DVT prophylaxis: Lovenox Code Status: Full Family Communication: None Disposition Plan:              Patient from home. Anticipate discharge to home. Barriers include clinical improvement in  lactic acidosis and hypotension.   Misty Rago, DO [facility] Direct contact: see [email or web address]  7PM-7AM contact night coverage as above 03/13/2019, 12:50 PM  LOS: 2 days

## 2019-03-13 NOTE — Evaluation (Addendum)
Clinical/Bedside Swallow Evaluation Patient Details  Name: Michael [name] MRN: [id number] Date of Birth: 28-Oct-1955  Today's Date: 03/13/2019 Time: SLP Start Time (ACUTE ONLY): 1529 SLP Stop Time (ACUTE ONLY): 1554 SLP Time Calculation (min) (ACUTE ONLY): 25 min  Past Medical History:  Past Medical History:  Diagnosis Date  . Anxiety   . Arthritis    "joints; shoulders; feet" (06/08/2016)  . Bipolar disorder ([place])   . Daily headache    "7:30 - 8:00 q night" (06/08/2016)  . Diabetic peripheral neuropathy ([place])   . GERD (gastroesophageal reflux disease)   . History of hepatitis B virus infection conferring immunity 03/22/2018  . Pancreatitis   . Schizophrenia ([place])   . Seizures ([place]) 01/2016 X 2  . Type II diabetes mellitus ([place])    Past Surgical History:  Past Surgical History:  Procedure Laterality Date  . BALLOON DILATION  03/22/2011   Procedure: BALLOON DILATION;  Surgeon: Gatha Mayer, MD;  Location: [facility];  Service: Endoscopy;  Laterality: N/A;  . COLONOSCOPY N/A 05/22/2012   Procedure: COLONOSCOPY;  Surgeon: Gatha Mayer, MD;  Location: [facility];  Service: Endoscopy;  Laterality: N/A;  . COLONOSCOPY W/ BIOPSIES AND POLYPECTOMY  09/12/11  . ESOPHAGOGASTRODUODENOSCOPY  03/22/2011   Procedure: ESOPHAGOGASTRODUODENOSCOPY (EGD);  Surgeon: Gatha Mayer, MD;  Location: [facility];  Service: Endoscopy;  Laterality: N/A;  egd with balloon   . EUS  04/20/2011   Procedure: UPPER ENDOSCOPIC ULTRASOUND (EUS) LINEAR;  Surgeon: Milus Banister, MD;  Location: [facility];  Service: Endoscopy;  Laterality: N/A;  . KNEE ARTHROSCOPY Left    HPI:  Pt is a 64 y/o male admitted secondary to acute respiratory distress due to acute COPD exacerbation and sepsis. A&P notable for aspiration PNA. He is on IV Zosyn. CT of head negative for acute abnormality. PMH includes GERD, DM, alcohol abuse, tobacco use, HTN, hepatitis B, bipolar disorder, and pancreatitis. Pt hospitalized with recent  seizures requiring intubation about 2 weeks ago.    Assessment / Plan / Recommendation Clinical Impression   Patient seen at bedside for clinical swallowing examination. Patient fatigued, but sufficiently arousable to participate in this assessment. His attention was waxing/waning, but he was able to attend given verbal cues. Patient's voice quality noted to be aphonic. Chart review reveals he is s/p intubation >2 weeks ago. Unsure of length of intubation. Patient edentulous. He inconsistently followed verbal commands, oral mechanism unremarkable. Cranial nerve exam somewhat difficult due to patient's mentation, but appeared to be wfl.   Patient seen with cup and straw sips of thin liquids: suspected delayed swallow initiation, immediate and delayed (<5 seconds) coughing noted in all trials. Patient with no overt s/sx aspiration with ice chips or small spoon sips of thin liquids.  Patient with some labial discoordination observed, difficulty grasping straw, but able to do so given mod verbal cues. Patient seen with cup and straw sips of NTL: no overt s/sx aspiration despite thorough challenging.  Pt seen with puree solids: grossly adequate oral acceptance, adequate AP transport and swallow initiation, good oral clearance and no overt s/s aspiration. With soft solids (chopped peaches), patient with adequate mastication, AP transport and swallow initiation. No overt s/sx aspiration. Regular solid trials deferred as patient closing eyes and appearing to fall asleep.   Recommend dysphagia 3 solids, nectar thick liquids. MBSS indicated to objectively assess swallow function, will schedule in next few days.  Oral care QID, upright positioning with PO, small bites/sips, intermittent supervision, oral suction set up  at bedside. RN educated and verbalized understanding.  Ice chips or small sips of thin liquid WATER okay following thorough oral care if patient is upright and alert.  ST to follow acutely for  diet tolerance.  SLP Visit Diagnosis: Dysphagia, oropharyngeal phase (R13.12)    Aspiration Risk  Moderate aspiration risk    Diet Recommendation Dysphagia 3 (Mech soft);Nectar-thick liquid   Liquid Administration via: Spoon;Straw Medication Administration: Crushed with puree Supervision: Intermittent supervision to cue for compensatory strategies Compensations: Minimize environmental distractions;Slow rate;Small sips/bites Postural Changes: Seated upright at 90 degrees    Other  Recommendations Oral Care Recommendations: Oral care QID Other Recommendations: Order thickener from pharmacy;Have oral suction available   Follow up Recommendations 24 hour supervision/assistance;Skilled Nursing facility      Frequency and Duration min 2x/week  2 weeks       Prognosis Prognosis for Safe Diet Advancement: Fair Barriers to Reach Goals: Cognitive deficits;Motivation      Swallow Study   General HPI: Pt is a 64 y/o male admitted secondary to acute respiratory distress due to acute COPD exacerbation and sepsis. A&P notable for aspiration PNA. He is on IV Zosyn. CT of head negative for acute abnormality. PMH includes GERD, DM, alcohol abuse, tobacco use, HTN, hepatitis B, bipolar disorder, and pancreatitis. Pt hospitalized with recent seizures requiring intubation about 2 weeks ago.  Type of Study: Bedside Swallow Evaluation Diet Prior to this Study: Dysphagia 3 (soft);Thin liquids Temperature Spikes Noted: No Respiratory Status: Room air Behavior/Cognition: Confused;Requires cueing Oral Cavity Assessment: Within Functional Limits Oral Cavity - Dentition: Edentulous Vision: Functional for self-feeding Self-Feeding Abilities: Needs assist Patient Positioning: Upright in bed Baseline Vocal Quality: Aphonic Volitional Cough: Weak Volitional Swallow: Able to elicit    Oral/Motor/Sensory Function Overall Oral Motor/Sensory Function: Within functional limits   Ice Chips Ice chips: Within  functional limits Presentation: Spoon   Thin Liquid Thin Liquid: Impaired Presentation: Cup;Spoon;Straw Oral Phase Impairments: Reduced labial seal;Poor awareness of bolus Pharyngeal  Phase Impairments: Suspected delayed Swallow;Cough - Delayed;Cough - Immediate    Nectar Thick Nectar Thick Liquid: Within functional limits Presentation: Cup;Straw   Honey Thick Honey Thick Liquid: Not tested   Puree Puree: Within functional limits   Solid     Solid: Within functional limits      Marina Goodell 03/13/2019,4:04 PM   Marina Goodell, M.Ed., [facility] Acute Rehabilitation [phone]: Acute Rehab office [phone] - pager

## 2019-03-13 NOTE — TOC Initial Note (Signed)
Transition of Care [facility]) - Initial/Assessment Note    Patient Details  Name: Michael [name] MRN: [id number] Date of Birth: [birth year]  Transition of [facility]) CM/SW Contact:    Alberteen Sam, [place] Phone Number: [phone] 03/13/2019, 1:07 PM  Clinical Narrative:                  CSW spoke with patient's sister Earlie Server who reports CSW should speak with patient's niece Kieth Brightly regarding dc planning. Phone was passed to [place] who states family is in agreement with SNF recommendation at time of discharge and is in agreeable to [place] sending referrals for bed offers to SNFs in the [place] area.   CSW has faxed referrals, pending bed offers at this time.   CSW working with Bryson Ha with [facility] on identifying patient's insurance coverage and which facilities will accept (appears to be [employer]).   Expected Discharge Plan: Skilled Nursing Facility Barriers to Discharge: Continued Medical Work up   Patient Goals and CMS Choice   CMS Medicare.gov Compare Post Acute Care list provided to:: Patient Represenative (must comment)(sister Earlie Server) Choice offered to / list presented to : Sibling(sister [name])  Expected Discharge Plan and Services Expected Discharge Plan: [place] Acute Care Choice: [place] Living arrangements for the past 2 months: Single Family Home                                      Prior Living Arrangements/Services Living arrangements for the past 2 months: Single Family Home Lives with:: Self Patient language and need for interpreter reviewed:: Yes Do you feel safe going back to the place where you live?: No   needs short term rehab  Need for Family Participation in Patient Care: Yes (Comment) Care giver support system in place?: Yes (comment)   Criminal Activity/Legal Involvement Pertinent to Current Situation/Hospitalization: No - Comment as needed  Activities of Daily Living      Permission  Sought/Granted Permission sought to share information with : Case Manager, Customer service manager, Family Supports Permission granted to share information with : Yes, Verbal Permission Granted  Share Information with NAME: Earlie Server  Permission granted to share info w AGENCY: SNF  Permission granted to share info w Relationship: sister  Permission granted to share info w Contact Information: [phone]  Emotional Assessment   Attitude/Demeanor/Rapport: Unable to Assess Affect (typically observed): Unable to Assess Orientation: : Oriented to Self Alcohol / Substance Use: Not Applicable Psych Involvement: No (comment)  Admission diagnosis:  Hypokalemia [E87.6] Seizure ([place]) [R56.9] Hypoglycemia [E16.2] Hypothermia, initial encounter [T68.XXXA] Acute metabolic encephalopathy [id number] Sepsis, due to unspecified organism, unspecified whether acute organ dysfunction present [facility]) [A41.9] Patient Active Problem List   Diagnosis Date Noted  . Hypothermia 03/10/2019  . Sepsis ([place]) 03/10/2019  . Seizure ([place]) 02/26/2019  . History of hepatitis B virus infection conferring immunity 03/22/2018  . Alcohol use disorder, severe, dependence ([place]) 12/29/2017  . Malnutrition of moderate degree 03/30/2017  . Hypoglycemia 03/26/2017  . Acute metabolic encephalopathy 0000000  . Other constipation 10/16/2016  . Recurrent falls 07/07/2016  . Wernicke's encephalopathy   . Bipolar disorder ([place]) 06/01/2016  . Dry skin 01/18/2016  . Peripheral vascular disease ([place]) 11/15/2015  . Hyperglycemia 09/02/2015  . Tobacco abuse 09/02/2015  . Tricompartment osteoarthritis of both knees 06/15/2015  . Vitamin D deficiency 03/12/2015  . Hyperlipidemia 02/17/2015  .  Protein-calorie malnutrition, severe 11/18/2014  . Alcohol use   . Cervical arthritis 01/01/2014  . Health care maintenance 08/25/2011  . Chronic alcoholic pancreatitis ([place]) 04/20/2011  . Type 2 diabetes mellitus with peripheral  neuropathy ([place]) 12/09/2010  . HTN (hypertension) 12/09/2010   PCP:  Helane Rima, MD Pharmacy:   [facility] [place], [place] - [address] AT [place] [address] Phone: [phone] Fax: [phone]     Social Determinants of Health (SDOH) Interventions    Readmission Risk Interventions No flowsheet data found.

## 2019-03-13 NOTE — NC FL2 (Signed)
[place] LEVEL OF CARE SCREENING TOOL     IDENTIFICATION  Patient Name: Michael [name] Birthdate: 10/14/1955 Sex: male Admission Date (Current Location): 03/10/2019  [facility] [place] Number:  Herbalist and Address:  [facility], [place] [address] [place], [place] [address]      Provider Number: [id number]  Attending Physician Name and Address:  Karie Kirks, DO  Relative Name and Phone Number:  Earlie Server (sister) [phone]    Current Level of Care: Hospital Recommended Level of Care: [place] Prior Approval Number:    Date Approved/Denied:   PASRR Number: (Screen Still running)  Discharge Plan: SNF    Current Diagnoses: Patient Active Problem List   Diagnosis Date Noted  . Hypothermia 03/10/2019  . Sepsis ([place]) 03/10/2019  . Seizure ([place]) 02/26/2019  . History of hepatitis B virus infection conferring immunity 03/22/2018  . Alcohol use disorder, severe, dependence ([place]) 12/29/2017  . Malnutrition of moderate degree 03/30/2017  . Hypoglycemia 03/26/2017  . Acute metabolic encephalopathy 0000000  . Other constipation 10/16/2016  . Recurrent falls 07/07/2016  . Wernicke's encephalopathy   . Bipolar disorder ([place]) 06/01/2016  . Dry skin 01/18/2016  . Peripheral vascular disease ([place]) 11/15/2015  . Hyperglycemia 09/02/2015  . Tobacco abuse 09/02/2015  . Tricompartment osteoarthritis of both knees 06/15/2015  . Vitamin D deficiency 03/12/2015  . Hyperlipidemia 02/17/2015  . Protein-calorie malnutrition, severe 11/18/2014  . Alcohol use   . Cervical arthritis 01/01/2014  . Health care maintenance 08/25/2011  . Chronic alcoholic pancreatitis ([place]) 04/20/2011  . Type 2 diabetes mellitus with peripheral neuropathy ([place]) 12/09/2010  . HTN (hypertension) 12/09/2010    Orientation RESPIRATION BLADDER Height & Weight     Self  Normal Incontinent, External catheter Weight: 105 lb 9.6 oz (47.9  kg) Height:  5\' 4"  (162.6 cm)  BEHAVIORAL SYMPTOMS/MOOD NEUROLOGICAL BOWEL NUTRITION STATUS      Continent Diet(see discharge summary)  AMBULATORY STATUS COMMUNICATION OF NEEDS Skin   Limited Assist Verbally                         Personal Care Assistance Level of Assistance  Bathing, Dressing, Feeding, Total care Bathing Assistance: Limited assistance Feeding assistance: Limited assistance Dressing Assistance: Limited assistance Total Care Assistance: Limited assistance   Functional Limitations Info  Sight, Speech, Hearing Sight Info: Adequate Hearing Info: Adequate Speech Info: Adequate    SPECIAL CARE FACTORS FREQUENCY  PT (By licensed PT), OT (By licensed OT)     PT Frequency: min 5x weekly OT Frequency: min 5x weekly            Contractures Contractures Info: Not present    Additional Factors Info  Code Status, Allergies Code Status Info: Full Allergies Info: Ace inhibitors, losartan, tylenol (acetaminophen)           Current Medications (03/13/2019):  This is the current hospital active medication list Current Facility-Administered Medications  Medication Dose Route Frequency Provider Last Rate Last Admin  . 0.9 %  sodium chloride infusion   Intravenous PRN Modena Jansky, MD 10 mL/hr at 03/11/19 2226 250 mL at 03/11/19 2226  . albuterol (PROVENTIL) (2.5 MG/3ML) 0.083% nebulizer solution 2.5 mg  2.5 mg Nebulization Q2H PRN Hongalgi, Anand D, MD      . budesonide (PULMICORT) nebulizer solution 0.5 mg  0.5 mg Nebulization BID Amin, Ankit Chirag, MD   0.5 mg at 03/13/19 0739  . enoxaparin (LOVENOX)  injection 40 mg  40 mg Subcutaneous Q24H Modena Jansky, MD   40 mg at 03/12/19 1844  . feeding supplement (ENSURE ENLIVE) (ENSURE ENLIVE) liquid 237 mL  237 mL Oral BID BM Swayze, Ava, DO   237 mL at [id number]  . folic acid (FOLVITE) tablet 1 mg  1 mg Oral Daily Modena Jansky, MD   1 mg at 03/13/19 [id number]  . insulin aspart (novoLOG) injection 0-9  Units  0-9 Units Subcutaneous Q4H Damita Lack, MD   1 Units at 03/13/19 [phone]  . insulin detemir (LEVEMIR) injection 5 Units  5 Units Subcutaneous BID Swayze, Ava, DO   5 Units at 03/13/19 1000  . ipratropium-albuterol (DUONEB) 0.5-2.5 (3) MG/3ML nebulizer solution 3 mL  3 mL Nebulization Q4H PRN Amin, Ankit Chirag, MD      . LORazepam (ATIVAN) tablet 1-4 mg  1-4 mg Oral Q1H PRN Modena Jansky, MD   2 mg at 03/12/19 1122   Or  . LORazepam (ATIVAN) injection 1-4 mg  1-4 mg Intravenous Q1H PRN Modena Jansky, MD   3 mg at 03/12/19 1420  . methylPREDNISolone sodium succinate (SOLU-MEDROL) 40 mg/mL injection 40 mg  40 mg Intravenous Q12H Amin, Ankit Chirag, MD   40 mg at 03/13/19 0928  . multivitamin with minerals tablet 1 tablet  1 tablet Oral Daily Modena Jansky, MD   1 tablet at 03/13/19 [phone]  . pantoprazole (PROTONIX) EC tablet 40 mg  40 mg Oral Daily Swayze, Ava, DO      . piperacillin-tazobactam (ZOSYN) IVPB 3.375 g  3.375 g Intravenous Q8H Hongalgi, Anand D, MD 12.5 mL/hr at 03/13/19 0621 3.375 g at 03/13/19 0621  . polyethylene glycol (MIRALAX / GLYCOLAX) packet 17 g  17 g Oral Daily PRN Damita Lack, MD      . Resource ThickenUp Clear   Oral PRN Berta Minor I, CCC-SLP      . senna-docusate (Senokot-S) tablet 2 tablet  2 tablet Oral QHS PRN Amin, Ankit Chirag, MD      . sodium chloride 0.9 % bolus 1,000 mL  1,000 mL Intravenous Once Swayze, Ava, DO      . sodium chloride flush (NS) 0.9 % injection 3 mL  3 mL Intravenous Q12H Modena Jansky, MD   3 mL at 03/13/19 0929  . thiamine tablet 100 mg  100 mg Oral Daily Vernell Leep D, MD   100 mg at 03/13/19 [id number]   Or  . thiamine (B-1) injection 100 mg  100 mg Intravenous Daily Modena Jansky, MD   100 mg at 03/11/19 [id number]     Discharge Medications: Please see discharge summary for a list of discharge medications.  Relevant Imaging Results:  Relevant Lab Results:   Additional Information SSN:  [id number]  Alberteen Sam, LCSW

## 2019-03-14 ENCOUNTER — Inpatient Hospital Stay (HOSPITAL_COMMUNITY): Payer: Medicare (Managed Care)

## 2019-03-14 LAB — MAGNESIUM: Magnesium: 1.6 mg/dL — ABNORMAL LOW (ref 1.7–2.4)

## 2019-03-14 LAB — COMPREHENSIVE METABOLIC PANEL
ALT: 27 U/L (ref 0–44)
AST: 21 U/L (ref 15–41)
Albumin: 1.7 g/dL — ABNORMAL LOW (ref 3.5–5.0)
Alkaline Phosphatase: 214 U/L — ABNORMAL HIGH (ref 38–126)
Anion gap: 7 (ref 5–15)
BUN: 10 mg/dL (ref 8–23)
CO2: 25 mmol/L (ref 22–32)
Calcium: 7.6 mg/dL — ABNORMAL LOW (ref 8.9–10.3)
Chloride: 108 mmol/L (ref 98–111)
Creatinine, Ser: 0.55 mg/dL — ABNORMAL LOW (ref 0.61–1.24)
GFR calc Af Amer: >60 mL/min (ref >60)
GFR calc non Af Amer: >60 mL/min (ref >60)
Glucose, Bld: 265 mg/dL — ABNORMAL HIGH (ref 70–99)
Potassium: 3.7 mmol/L (ref 3.5–5.1)
Sodium: 140 mmol/L (ref 135–145)
Total Bilirubin: 0.6 mg/dL (ref 0.3–1.2)
Total Protein: 4.5 g/dL — ABNORMAL LOW (ref 6.5–8.1)

## 2019-03-14 LAB — GLUCOSE, CAPILLARY
Glucose-Capillary: 152 mg/dL — ABNORMAL HIGH (ref 70–99)
Glucose-Capillary: 165 mg/dL — ABNORMAL HIGH (ref 70–99)
Glucose-Capillary: 198 mg/dL — ABNORMAL HIGH (ref 70–99)
Glucose-Capillary: 215 mg/dL — ABNORMAL HIGH (ref 70–99)
Glucose-Capillary: 243 mg/dL — ABNORMAL HIGH (ref 70–99)
Glucose-Capillary: 245 mg/dL — ABNORMAL HIGH (ref 70–99)

## 2019-03-14 LAB — CBC
HCT: 33.8 % — ABNORMAL LOW (ref 39.0–52.0)
Hemoglobin: 11.6 g/dL — ABNORMAL LOW (ref 13.0–17.0)
MCH: 32.7 pg (ref 26.0–34.0)
MCHC: 34.3 g/dL (ref 30.0–36.0)
MCV: 95.2 fL (ref 80.0–100.0)
Platelets: 165 10*3/uL (ref 150–400)
RBC: 3.55 MIL/uL — ABNORMAL LOW (ref 4.22–5.81)
RDW: 14.7 % (ref 11.5–15.5)
WBC: 8.2 10*3/uL (ref 4.0–10.5)
nRBC: 0 % (ref 0.0–0.2)

## 2019-03-14 LAB — FOLATE RBC
Folate, Hemolysate: 367 ng/mL
Folate, RBC: 902 ng/mL (ref >498)
Hematocrit: 40.7 % (ref 37.5–51.0)

## 2019-03-14 MED ORDER — MAGNESIUM SULFATE 4 GM/100ML IV SOLN
4.0000 g | Freq: Once | INTRAVENOUS | Status: AC
  Administered 2019-03-14: 4 g via INTRAVENOUS
  Filled 2019-03-14: qty 100

## 2019-03-14 MED ORDER — INSULIN DETEMIR 100 UNIT/ML ~~LOC~~ SOLN
10.0000 [IU] | Freq: Two times a day (BID) | SUBCUTANEOUS | Status: DC
  Administered 2019-03-14 – 2019-03-15 (×2): 10 [IU] via SUBCUTANEOUS
  Filled 2019-03-14 (×3): qty 0.1

## 2019-03-14 MED ORDER — SODIUM CHLORIDE 0.9 % IV BOLUS
1000.0000 mL | Freq: Once | INTRAVENOUS | Status: AC
  Administered 2019-03-14: 1000 mL via INTRAVENOUS

## 2019-03-14 MED ORDER — LORAZEPAM 2 MG/ML IJ SOLN
1.0000 mg | Freq: Four times a day (QID) | INTRAMUSCULAR | Status: AC | PRN
  Administered 2019-03-15 – 2019-03-16 (×2): 1 mg via INTRAVENOUS
  Filled 2019-03-14 (×3): qty 1

## 2019-03-14 NOTE — Progress Notes (Addendum)
PROGRESS NOTE  Michael [name] [id number] DOB: 1955/03/11 DOA: 03/10/2019 PCP: Helane Rima, MD  Brief History   64 year old with history of alcohol abuse, tobacco use, DM2, HTN, GERD, hepatitis B, bipolar disorder, pancreatitis, recent seizures requiring intubation about 2 weeks ago ended up leaving [place] presents to the hospital after having witnessed seizure at home.  He was noted to be hypoglycemic, hypothermic, elevated lactate.  CT head was negative.  Neurology team was consulted.  Neurology feels that the patient's recurrent seizures are due to his hypoglycemia.  EEG did not reveal epilleptiform abnormalities and they do not recommend epileptic therapy or further evaluation unless he has an unprovoked seizures in the futures.   The patient has been evaluated by PT/OT. They have recommended SNF placement and family is in agreement. TOC consulted for assistance.  Consultants  . Neurology  Procedures  . EEG - No epileptiform abnormality. No epileptic therapy.   Antibiotics   Anti-infectives (From admission, onward)   Start     Dose/Rate Route Frequency Ordered Stop   03/10/19 2200  piperacillin-tazobactam (ZOSYN) IVPB 3.375 g     3.375 g 12.5 mL/hr over 240 Minutes Intravenous Every 8 hours 03/10/19 1858     03/10/19 1430  piperacillin-tazobactam (ZOSYN) IVPB 3.375 g     3.375 g 100 mL/hr over 30 Minutes Intravenous  Once 03/10/19 1416 03/10/19 1740     Subjective  The patient is resting quietly. No new complaints.  Objective   Vitals:  Vitals:   03/14/19 0619 03/14/19 1230  BP: 116/78 98/65  Pulse: 80 83  Resp: 18 18  Temp: 98.4 F (36.9 C) 98.5 F (36.9 C)  SpO2: 100% 98%   Exam:  Constitutional:  . The patient is awake, alert, and oriented x 3. No acute distress. Respiratory:  . No increased work of breathing. . No wheezes, rales, or rhonchi . No tactile fremitus Cardiovascular:  . Regular rate and rhythm . No murmurs, ectopy, or  gallups. . No lateral PMI. No thrills. Abdomen:  . Abdomen is soft, non-tender, non-distended . No hernias, masses, or organomegaly . Normoactive bowel sounds.  Musculoskeletal:  . No cyanosis, clubbing, or edema Skin:  . No rashes, lesions, ulcers . palpation of skin: no induration or nodules Neurologic:  . CN 2-12 intact . Sensation all 4 extremities intact Psychiatric:  . Mental status: The patient is awake and alert.   I have personally reviewed the following:   Today's Data  . Vitals, BMP, Lactic acid, CBC, Magnesium  Micro Data  . Blood cultures: no growth x 2 days. . Urine cultures: no growth   Imaging  . CXR Right lower lobe opacity .  Scheduled Meds: . budesonide (PULMICORT) nebulizer solution  0.5 mg Nebulization BID  . enoxaparin (LOVENOX) injection  40 mg Subcutaneous Q24H  . feeding supplement (ENSURE ENLIVE)  237 mL Oral BID BM  . folic acid  1 mg Oral Daily  . insulin aspart  0-9 Units Subcutaneous Q4H  . insulin detemir  5 Units Subcutaneous BID  . methylPREDNISolone (SOLU-MEDROL) injection  40 mg Intravenous Q12H  . multivitamin with minerals  1 tablet Oral Daily  . pantoprazole  40 mg Oral Daily  . sodium chloride flush  3 mL Intravenous Q12H  . thiamine  100 mg Oral Daily   Or  . thiamine  100 mg Intravenous Daily   Continuous Infusions: . sodium chloride 1,000 mL (03/14/19 [id number])  . piperacillin-tazobactam (ZOSYN)  IV Stopped (03/14/19 [id number])  Principal Problem:   Acute metabolic encephalopathy Active Problems:   Type 2 diabetes mellitus with peripheral neuropathy (HCC)   Chronic alcoholic pancreatitis (HCC)   Protein-calorie malnutrition, severe   Tobacco abuse   Hypoglycemia   Alcohol use disorder, severe, dependence ([place])   Seizure ([place])   Hypothermia   Sepsis ([place])   LOS: 4 days   A & P  Acute respiratory distress with coughing due to acute COPD exacerbation: Resolved. The patient is receiving aggressive bronchodilators and  solu-medrol.  Incentive spirometer and flutter valve are being utilized. He is COVID - 19 negative. The patient is saturating at 98% on room air at this time.  Aspiration pneumonia: Resulting in COPD exacerbation. Persistent Right lower lobe opacity due to aspiration pneumonia. The patient is on IV Zosyn and aspiration precautions. Procalcitonin positive at 1.85. Today is day 5 of IV antibiotics. Will transition to PO augmentin.  Sepsis: Lactic acidosis at Lactic Acid at 3.3, hypotension, and positive procalcitonin. Blood cultures x 2 have had no growth. Pt is receiving IV Zosyn. Lactic acid is slowly improving with repeated boluses.   Mild Volume Overload: BMP is somewhat elevated at 229.5. Doubt significant overload. Lasix stopped. Echocardiogram performed on 03/13/2019 demonstrated EF of 60-65% and grad 1 diastolic dysfunction. No wall motion abnormalities and normal right ventricular function.   Severe recurrent seizures: Due to hypoglycemia per neurology. Pt also is dependent upon alcohol. Neurology does not see epilleptiform abnormalities on EEG. They do not recommend continuation of epilepsy medication. They also do not recommend further evaluation unless the patient presents with unprovoked seizures. Keppra has been discontinued.  Postictal period: Resolved. Due to recurrent seizures as above. Encephalopathy related to post-ictal period is waning.   Alcohol dependence and abuse: CIWA protocol for withdrawal.   Euthyroid Sick Syndrome: Low TSH. Normal FT4 with mildly low FT3.   Diabetes mellitus type 2, poorly controlled due to hyperglycemia/hypoglycemia: Glucoses to be managed with FSBS and SSI as well as Levemir bid which has been increased from 5 units to 10 due to hyperglycemia. Oral glypizide has been held.Glucoses for the last 24 hours have run between 164 and 331.   Hypothermia: Resolved. Due to hypoglycemia/sepsis. Monitor. 36.4 this am.  Prolonged QTC: Admission QTC 646.   Improved on repeat EKG after supplementing electrolytes.  Concern for hepatic encephalopathy: Due to patient's ETOH history and mildly elevated ammonium level. Will stop lactulose as level of alertness is improved.  Hypokalemia/hypomagnesemia: Resolved. Monitor..  Daily tobacco use: Nicotine patch  Essential hypertension: Blood pressures actually low due to sepsis. Antihypertensives held. Monitor.  GERD: Continue PPI as oral.  I have seen and examined this patient myself. I have spent 30 minutes in his evaluation and care.  DVT prophylaxis: Lovenox Code Status: Full Family Communication: None Disposition Plan: Patient is from home. He lives alone.  Recommendation from PT/OT is for SNF. Discharge to home is not a safe discharge plan.  Family is in agreement.   Kaenan Jake, DO [facility] Direct contact: see [email or web address]  7PM-7AM contact night coverage as above 03/14/2019, 1:13 PM  LOS: 2 days

## 2019-03-14 NOTE — Progress Notes (Addendum)
Inpatient Diabetes Program Recommendations  AACE/ADA: New Consensus Statement on Inpatient Glycemic Control (2015)  Target Ranges:  Prepandial:   less than 140 mg/dL      Peak postprandial:   less than 180 mg/dL (1-2 hours)      Critically ill patients:  140 - 180 mg/dL   Lab Results  Component Value Date   GLUCAP 215 (H) 03/14/2019   HGBA1C 14.8 (H) 03/11/2019    Review of Glycemic Control  Results for Michael [name], Michael [name] (MRN [id number]) as of 03/14/2019 13:26  Ref. Range 03/13/2019 16:42 03/13/2019 19:50 03/14/2019 00:37 03/14/2019 06:03 03/14/2019 07:47  Glucose-Capillary Latest Ref Range: 70 - 99 mg/dL 331 (H) 229 (H) 165 (H) 245 (H) 215 (H)    Diabetes history: DM2 Outpatient Diabetes medications: Levemir 15 units Twice daily + Glipizide 10 mg daily + Novolog 5 units with breakfast + 10 units with lunch and dinner   Note:  Spoke with patient at bedside.  Reviewed patient's current A1c of 14.8%-average 378 mg/dl. Explained what a A1c is and what it measures. Also reviewed goal A1c with patient, importance of good glucose control @ home, and blood sugar goals.  Denies difficulty obtaining medications.  Confirmed above home medications and doses.  He states he is taking as prescribed. He rotates sites and usually administers in the lateral abdominal area. He states he only has 5 strips left for his Accu Chek Aviva meter.  Found in Dr. Tonette Bihari note (PCP) he was prescribed 400 strips on 03/09/19.  He states he checks his blood sugar one time a day.  Encouraged him to check his blood sugar ac/hs (4x a day) as A1C is 14.8.  He states his meter usually reads 160's.  He checks his BS in the evening.  Reviewed Plate Method. He admits to drinking sugary drinks.  Educated patient on CHO and CHO intake.  Reviewed hypoglycemia and treatment.    Thank you, Reche Dixon, RN, BSN Diabetes Coordinator Inpatient Diabetes Program [phone] (team pager from 8a-5p)

## 2019-03-14 NOTE — TOC Progression Note (Signed)
Transition of Care [facility]) - Progression Note    Patient Details  Name: Michael [name] MRN: [id number] Date of Birth: 06/14/1955  Transition of Care [facility]) CM/SW [place], [place] Phone Number: 03/14/2019, 3:54 PM  Clinical Narrative:     Patient has no bed offers, CSW has faxed out to more facilities in larger geographical range.   CSW has inquired from facilities as to why patient is being denied, reasons for denial include insurance that does not cover SNF, behaviors, bipolar and alcohol abuse history as well as high needs.   Continue SNF bed search at this time.   Expected Discharge Plan: [place] Barriers to Discharge: Continued Medical Work up  Expected Discharge Plan and Services Expected Discharge Plan: [place] Choice: [place] arrangements for the past 2 months: Single Family Home                                       Social Determinants of Health (SDOH) Interventions    Readmission Risk Interventions No flowsheet data found.

## 2019-03-14 NOTE — Progress Notes (Signed)
  Speech Language Pathology Treatment: Dysphagia  Patient Details Name: Michael [name] MRN: [id number] DOB: Nov 16, 1955 Today's Date: 03/14/2019 Time: 1233-1300 SLP Time Calculation (min) (ACUTE ONLY): 27 min  Assessment / Plan / Recommendation Clinical Impression  Pt was seen for dysphagia treatment and was cooperative throughout the session. He was educated regarding the results of the modified barium swallow study. Video recording of the study was used to facilitate education and pt verbalized understanding but, considering the nature of the questions asked, reinforcement will be needed. He was educated regarding the purpose of dysphagia exercises and effortful swallows were completed with moderate verbal cues. He tolerated dysphagia 1 solids without overt s/sx of aspiration. Moderate support was needed for pt to reduce bolus size and intake rate. Pt stated that he typically eats very quickly. Her further expressed that he can eat a hamburger, two biscuits with gravy and rice in "seven minutes flat". He was educated regarding his increased risk of aspiration with larger boluses due to premature spillage to the pyriform which has been seen to spillover into the airway and facilitate aspiration. He verbalized understanding but will require full supervision for consistent observance of swallowing precautions.    HPI HPI: Pt is a 64 year old male, PMH of alcohol dependence, tobacco abuse, type II DM/IDDM, HTN, GERD, hepatitis B, pancreatitis, bipolar disorder, prior seizure disorder, recent hospital admission 02/26/2019 when he had presented with seizure, hyperglycemia, required intubation for airway protection and was extubated the next day and then left the hospital against medical advice. He presented to the ED with history of witnessed seizure at home and was unable to provide any history at that time due to altered mental status. Chest x-ray 03/11/19: persistent right lower lobe airspace opacity, not  significantly changed from previous exam. CT of the head was negative. EEG: moderate diffuse encephalopathy of non-specific etiology.      SLP Plan  Continue with current plan of care       Recommendations  Diet recommendations: Dysphagia 1 (puree);Honey-thick liquid Liquids provided via: Cup;No straw Medication Administration: Whole meds with puree Supervision: Full supervision/cueing for compensatory strategies;Patient able to self feed Compensations: Minimize environmental distractions;Slow rate;Small sips/bites Postural Changes and/or Swallow Maneuvers: Seated upright 90 degrees                Oral Care Recommendations: Oral care BID Follow up Recommendations: 24 hour supervision/assistance;Skilled Nursing facility SLP Visit Diagnosis: Dysphagia, oropharyngeal phase (R13.12) Plan: Continue with current plan of care       Raiyah Speakman I. Hardin Negus, [place], [place] Office number [phone] Pager [place] 03/14/2019, 1:52 PM

## 2019-03-14 NOTE — Progress Notes (Addendum)
Occupational Therapy Evaluation Patient Details Name: Michael [name] MRN: [id number] DOB: 05-26-1955 Today's Date: 03/14/2019    History of Present Illness Pt is a 64 y/o male admitted secondary to COPD exacerbation and sepsis. Pt also with seizure like activity prior to admission. CT of head negative for acute abnormality. PMH includes DM, alcohol abuse, tobacco use, HTN, hepatitis B, bipolar disorder, and pancreatitis.    Clinical Impression   PTA patient reports independent with ADLs, mobility with modified independence using RW.  Admitted for above and limited by problem list below, including impaired cognition, decreased activity tolerance, generalized weakness.  Limited eval as patient declining OOB, agreeable to OT session initially but then declines exiting bed. Able to complete long sitting and repositioning in bed with min assist, anticipate ADLS with supervision to mod assist.  He is oriented to self, reports 2001 when asked year, and follows simple commands with increased time; noted poor safety awareness and awareness to deficits throughout session (ie provided with honey thick per SLP recommendation drink, and pt asked for thin liquids).  Continue cognitive assessment, at time difficult to understand due to mumbled, soft, raspy speech. Based on performance today, continued OT services while admitted and after dc at SNF level in order to optimize safety, independence with ADLs, mobility.     Follow Up Recommendations  SNF;Supervision/Assistance - 24 hour    Equipment Recommendations  Other (comment)(TBD at next venue of care)    Recommendations for Other Services       Precautions / Restrictions Precautions Precautions: Fall Restrictions Weight Bearing Restrictions: No      Mobility Bed Mobility Overal bed mobility: Needs Assistance             General bed mobility comments: pt transitioned from supine to long sitting with min assist with HOB flat, scooting hips to  [facility] with min assist but declined further mobility   Transfers                 General transfer comment: pt declined     Balance Overall balance assessment: Needs assistance Sitting-balance support: No upper extremity supported Sitting balance-Leahy Scale: Fair Sitting balance - Comments: long sitting in bed                                    ADL either performed or assessed with clinical judgement   ADL Overall ADL's : Needs assistance/impaired     Grooming: Set up;Sitting   Upper Body Bathing: Minimal assistance;Sitting   Lower Body Bathing: Moderate assistance;Sitting/lateral leans   Upper Body Dressing : Minimal assistance;Sitting   Lower Body Dressing: Moderate assistance;Sitting/lateral leans     Toilet Transfer Details (indicate cue type and reason): deferred, pt declined          Functional mobility during ADLs: Minimal assistance(scooting towards HOB ) General ADL Comments: pt limited by cognition, weakness      Vision         Perception     Praxis      Pertinent Vitals/Pain Pain Assessment: No/denies pain     Hand Dominance Left   Extremity/Trunk Assessment Upper Extremity Assessment Upper Extremity Assessment: Generalized weakness(reports R clavicle fracture history with limited range ')   Lower Extremity Assessment Lower Extremity Assessment: Defer to PT evaluation   Cervical / Trunk Assessment Cervical / Trunk Assessment: Kyphotic   Communication Communication Communication: Expressive difficulties(raspy voice, soft spoken and mumbled )  Cognition Arousal/Alertness: Awake/alert Behavior During Therapy: Agitated Overall Cognitive Status: No family/caregiver present to determine baseline cognitive functioning                                 General Comments: pt with limited engagement, frustrated about not getting lunch yet (diet changed); follows simple cueing with increased time but limited  awareness to safety and probelm sovling (mixed up honey thick drink and pt asked for regular thin drink(   General Comments  RN/NT aware of need for lunch, has been ordered but hasn't been delievered yet     Exercises     Shoulder Instructions      Home Living Family/patient expects to be discharged to:: Private residence Living Arrangements: Other relatives(sister) Available Help at Discharge: Family Type of Home: House Home Access: Stairs to enter Technical brewer of Steps: 3 Entrance Stairs-Rails: Right Home Layout: One level     Bathroom Shower/Tub: Teacher, early years/pre: Standard     Home Equipment: Environmental consultant - 2 wheels          Prior Functioning/Environment Level of Independence: Independent with assistive device(s)        Comments: reports using RW for mobility to prevent falls, indepenent basic ADls         OT Problem List: Decreased strength;Decreased activity tolerance;Impaired balance (sitting and/or standing);Decreased safety awareness;Decreased knowledge of use of DME or AE;Decreased knowledge of precautions;Decreased cognition      OT Treatment/Interventions: Self-care/ADL training;DME and/or AE instruction;Therapeutic exercise;Therapeutic activities;Cognitive remediation/compensation;Balance training;Patient/family education    OT Goals(Current goals can be found in the care plan section) Acute Rehab OT Goals Patient Stated Goal: to get my lunch  OT Goal Formulation: With patient Time For Goal Achievement: 03/28/19 Potential to Achieve Goals: Good  OT Frequency: Min 2X/week   Barriers to D/C:            Co-evaluation              AM-PAC OT "6 Clicks" Daily Activity     Outcome Measure Help from another person eating meals?: A Little Help from another person taking care of personal grooming?: A Little Help from another person toileting, which includes using toliet, bedpan, or urinal?: A Lot Help from another person  bathing (including washing, rinsing, drying)?: A Lot Help from another person to put on and taking off regular upper body clothing?: A Little Help from another person to put on and taking off regular lower body clothing?: A Lot 6 Click Score: 15   End of Session Nurse Communication: Mobility status  Activity Tolerance: Treatment limited secondary to agitation Patient left: in bed;with call bell/phone within reach;with bed alarm set  OT Visit Diagnosis: Other abnormalities of gait and mobility (R26.89);Muscle weakness (generalized) (M62.81);Other symptoms and signs involving cognitive function                Time: 1345-1405 OT Time Calculation (min): 20 min Charges:  OT General Charges $OT Visit: 1 Visit OT Evaluation $OT Eval Moderate Complexity: 1 Mod  Jolaine Artist, OT Acute Rehabilitation Services Pager [phone] Office [phone]   Delight Stare 03/14/2019, 2:36 PM

## 2019-03-14 NOTE — Progress Notes (Signed)
Modified Barium Swallow Progress Note  Patient Details  Name: Michael [name] MRN: [id number] Date of Birth: November 16, 1955  Today's Date: 03/14/2019  Modified Barium Swallow completed.  Full report located under Chart Review in the Imaging Section.  Brief recommendations include the following:  Clinical Impression  Pt was seen in radiology suite for modified barium swallow study. He was seated upright in swallow chair for the study which was conducted in the lateral view. Trials of puree, mechanical soft solids, regular texture solids, thin liquids, nectar thick liquids and honey thick liquids were administered. He presents with moderate oropharyngeal phase dysphagia characterized by impaired bolus control and a pharyngeal delay which resulted in penetration and aspiration. He exhibited premature spillage to the valleculae and pyriform sinuses accross consistencies, penetration with honey thick liquids (PAS 2), with nectar thick liquids (PAS 3), and aspiration (PAS 7,8) with dysphagia 2 solids, thin liquids and nectar thick liquids. Pt demonstrated impulsive tendencies during the study and was not responsive to cues to reduce bolus sizes. With bolus sizes reduced to 5cc thin liquids, penetration (PAS 3) was still noted. No functional benefit was appreciated with reduced bolus sizes of solids or with a chin tuck posture. Coughing was inconsistently noted following episodes of aspiration but it was unsuccessful in expelling the aspirant. A dysphagia 1 diet with honey thick liquids is recommended at this time. Pt may have ice chips between meals following oral care. SLP will follow for dysphagia treatment.    Swallow Evaluation Recommendations       SLP Diet Recommendations: Dysphagia 1 (Puree) solids;Ice chips PRN after oral care;Honey thick liquids   Liquid Administration via: No straw;Cup   Medication Administration: Crushed with puree   Supervision: Patient able to self feed;Intermittent  supervision to cue for compensatory strategies   Compensations: Minimize environmental distractions;Slow rate;Small sips/bites   Postural Changes: Remain semi-upright after after feeds/meals (Comment)   Oral Care Recommendations: Oral care BID   Other Recommendations: Order thickener from [place] I. Hardin Negus, [place], [place] Office number [phone] Pager [phone]  Horton Marshall 03/14/2019,11:26 AM

## 2019-03-15 LAB — CBC
HCT: 34.6 % — ABNORMAL LOW (ref 39.0–52.0)
Hemoglobin: 11.7 g/dL — ABNORMAL LOW (ref 13.0–17.0)
MCH: 33.4 pg (ref 26.0–34.0)
MCHC: 33.8 g/dL (ref 30.0–36.0)
MCV: 98.9 fL (ref 80.0–100.0)
Platelets: 161 10*3/uL (ref 150–400)
RBC: 3.5 MIL/uL — ABNORMAL LOW (ref 4.22–5.81)
RDW: 15 % (ref 11.5–15.5)
WBC: 9.6 10*3/uL (ref 4.0–10.5)
nRBC: 0 % (ref 0.0–0.2)

## 2019-03-15 LAB — GLUCOSE, CAPILLARY
Glucose-Capillary: 124 mg/dL — ABNORMAL HIGH (ref 70–99)
Glucose-Capillary: 172 mg/dL — ABNORMAL HIGH (ref 70–99)
Glucose-Capillary: 218 mg/dL — ABNORMAL HIGH (ref 70–99)
Glucose-Capillary: 227 mg/dL — ABNORMAL HIGH (ref 70–99)
Glucose-Capillary: 323 mg/dL — ABNORMAL HIGH (ref 70–99)
Glucose-Capillary: 37 mg/dL — CL (ref 70–99)
Glucose-Capillary: 93 mg/dL (ref 70–99)

## 2019-03-15 LAB — BASIC METABOLIC PANEL
Anion gap: 9 (ref 5–15)
Anion gap: 9 (ref 5–15)
BUN: 9 mg/dL (ref 8–23)
BUN: 7 mg/dL — ABNORMAL LOW (ref 8–23)
CO2: 20 mmol/L — ABNORMAL LOW (ref 22–32)
CO2: 22 mmol/L (ref 22–32)
Calcium: 7.6 mg/dL — ABNORMAL LOW (ref 8.9–10.3)
Calcium: 8 mg/dL — ABNORMAL LOW (ref 8.9–10.3)
Chloride: 114 mmol/L — ABNORMAL HIGH (ref 98–111)
Chloride: 113 mmol/L — ABNORMAL HIGH (ref 98–111)
Creatinine, Ser: 0.65 mg/dL (ref 0.61–1.24)
Creatinine, Ser: 0.55 mg/dL — ABNORMAL LOW (ref 0.61–1.24)
GFR calc Af Amer: >60 mL/min (ref >60)
GFR calc Af Amer: >60 mL/min (ref >60)
GFR calc non Af Amer: >60 mL/min (ref >60)
GFR calc non Af Amer: >60 mL/min (ref >60)
Glucose, Bld: 107 mg/dL — ABNORMAL HIGH (ref 70–99)
Glucose, Bld: 101 mg/dL — ABNORMAL HIGH (ref 70–99)
Potassium: 3.2 mmol/L — ABNORMAL LOW (ref 3.5–5.1)
Potassium: 3.6 mmol/L (ref 3.5–5.1)
Sodium: 143 mmol/L (ref 135–145)
Sodium: 144 mmol/L (ref 135–145)

## 2019-03-15 LAB — MAGNESIUM: Magnesium: 1.8 mg/dL (ref 1.7–2.4)

## 2019-03-15 LAB — CULTURE, BLOOD (ROUTINE X 2)
Culture: NO GROWTH
Culture: NO GROWTH
Special Requests: ADEQUATE

## 2019-03-15 LAB — LACTIC ACID, PLASMA: Lactic Acid, Venous: 2.8 mmol/L (ref 0.5–1.9)

## 2019-03-15 MED ORDER — AMOXICILLIN-POT CLAVULANATE 875-125 MG PO TABS
1.0000 | ORAL_TABLET | Freq: Two times a day (BID) | ORAL | Status: DC
  Administered 2019-03-15 – 2019-03-19 (×9): 1 via ORAL
  Filled 2019-03-15 (×9): qty 1

## 2019-03-15 MED ORDER — POTASSIUM CHLORIDE CRYS ER 20 MEQ PO TBCR
40.0000 meq | EXTENDED_RELEASE_TABLET | Freq: Once | ORAL | Status: AC
  Administered 2019-03-15: 40 meq via ORAL
  Filled 2019-03-15: qty 2

## 2019-03-15 MED ORDER — SODIUM CHLORIDE 0.9 % IV BOLUS
1000.0000 mL | Freq: Once | INTRAVENOUS | Status: AC
  Administered 2019-03-15: 1000 mL via INTRAVENOUS

## 2019-03-15 MED ORDER — INSULIN DETEMIR 100 UNIT/ML ~~LOC~~ SOLN
6.0000 [IU] | Freq: Two times a day (BID) | SUBCUTANEOUS | Status: DC
  Administered 2019-03-15 – 2019-03-18 (×6): 6 [IU] via SUBCUTANEOUS
  Filled 2019-03-15 (×8): qty 0.06

## 2019-03-15 NOTE — Progress Notes (Signed)
   03/15/19 0028  Vitals  Temp  (Patient refused all VS and BG at this time x2)  ECG Heart Rate 85  MEWS Score  MEWS Temp 0  MEWS Systolic 0  MEWS Pulse 0  MEWS RR 0  MEWS LOC 0  MEWS Score 0  MEWS Score Color Green  Patient refused all VS and CBG at this time. Patient educated of importance of CBG and VS monitoring and refused to allow NT and RN to obtain.

## 2019-03-15 NOTE — Progress Notes (Signed)
Hypoglycemic Event  CBG: 37  Treatment: 8oz of orange juice and breakfast tray has arrived. Patient encouraged to eat full meal and beverage  Symptoms: None. Patient A/Ox4 and following commands  Follow-up CBG: Time:0740 CBG Result:TBD  Possible Reasons for Event: Somogyi effect? Patient has been snacking frequently throughout the evening and has needed Humalog coverage for 2000, 0000, and 0400.  Comments: Care handed off to oncoming nurse Rejeana Brock, RN to follow up with next CBG.    Sue Lush
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
## 2019-03-15 NOTE — Progress Notes (Signed)
PROGRESS NOTE  Michael [name] [id number] DOB: Aug 16, 1955 DOA: 03/10/2019 PCP: Helane Rima, MD  Brief History   64 year old with history of alcohol abuse, tobacco use, DM2, HTN, GERD, hepatitis B, bipolar disorder, pancreatitis, recent seizures requiring intubation about 2 weeks ago ended up leaving [name] presents to the hospital after having witnessed seizure at home.  He was noted to be hypoglycemic, hypothermic, elevated lactate.  CT head was negative.  Neurology team was consulted.  Neurology feels that the patient's recurrent seizures are due to his hypoglycemia.  EEG did not reveal epilleptiform abnormalities and they do not recommend epileptic therapy or further evaluation unless he has an unprovoked seizures in the futures.   The patient has been evaluated by PT/OT. They have recommended SNF placement and family is in agreement. TOC consulted for assistance.  The patient was found to be hypoglycemic this at with glucose 37. This after he refused FSBS last night.   Consultants  . Neurology  Procedures  . EEG - No epileptiform abnormality. No epileptic therapy.   Antibiotics   Anti-infectives (From admission, onward)   Start     Dose/Rate Route Frequency Ordered Stop   03/10/19 2200  piperacillin-tazobactam (ZOSYN) IVPB 3.375 g     3.375 g 12.5 mL/hr over 240 Minutes Intravenous Every 8 hours 03/10/19 1858     03/10/19 1430  piperacillin-tazobactam (ZOSYN) IVPB 3.375 g     3.375 g 100 mL/hr over 30 Minutes Intravenous  Once 03/10/19 1416 03/10/19 1740     Subjective  The patient is resting quietly. No new complaints.  Objective   Vitals:  Vitals:   03/15/19 0842 03/15/19 1128  BP:  115/78  Pulse:  83  Resp:  16  Temp:  98 F (36.7 C)  SpO2: 100% 100%   Exam:  Constitutional:  . The patient is awake, alert, and oriented x 3. No acute distress. Respiratory:  . No increased work of breathing. . No wheezes, rales, or rhonchi . No tactile  fremitus Cardiovascular:  . Regular rate and rhythm . No murmurs, ectopy, or gallups. . No lateral PMI. No thrills. Abdomen:  . Abdomen is soft, non-tender, non-distended . No hernias, masses, or organomegaly . Normoactive bowel sounds.  Musculoskeletal:  . No cyanosis, clubbing, or edema Skin:  . No rashes, lesions, ulcers . palpation of skin: no induration or nodules Neurologic:  . CN 2-12 intact . Sensation all 4 extremities intact Psychiatric:  . Mental status: The patient is awake and alert.   I have personally reviewed the following:   Today's Data  . Vitals, BMP, Lactic acid, CBC, Magnesium  Micro Data  . Blood cultures: no growth x 2 days. . Urine cultures: no growth   Imaging  . CXR Right lower lobe opacity .  Scheduled Meds: . budesonide (PULMICORT) nebulizer solution  0.5 mg Nebulization BID  . enoxaparin (LOVENOX) injection  40 mg Subcutaneous Q24H  . feeding supplement (ENSURE ENLIVE)  237 mL Oral BID BM  . folic acid  1 mg Oral Daily  . insulin aspart  0-9 Units Subcutaneous Q4H  . insulin detemir  6 Units Subcutaneous BID  . methylPREDNISolone (SOLU-MEDROL) injection  40 mg Intravenous Q12H  . multivitamin with minerals  1 tablet Oral Daily  . pantoprazole  40 mg Oral Daily  . sodium chloride flush  3 mL Intravenous Q12H  . thiamine  100 mg Oral Daily   Or  . thiamine  100 mg Intravenous Daily   Continuous  Infusions: . sodium chloride 1,000 mL (03/14/19 [id number])  . piperacillin-tazobactam (ZOSYN)  IV 3.375 g (03/15/19 0448)    Principal Problem:   Acute metabolic encephalopathy Active Problems:   Type 2 diabetes mellitus with peripheral neuropathy (HCC)   Chronic alcoholic pancreatitis (HCC)   Protein-calorie malnutrition, severe   Tobacco abuse   Hypoglycemia   Alcohol use disorder, severe, dependence ([place])   Seizure ([place])   Hypothermia   Sepsis ([place])   LOS: 5 days   A & P  Acute respiratory distress with coughing due to acute COPD  exacerbation: Resolved. The patient is receiving aggressive bronchodilators and solu-medrol.  Incentive spirometer and flutter valve are being utilized. He is COVID - 19 negative. The patient is saturating at 100% on room air at this time.  Aspiration pneumonia: Resulting in COPD exacerbation. Persistent Right lower lobe opacity due to aspiration pneumonia. The patient is on IV Zosyn and aspiration precautions. Procalcitonin positive at 1.85. Today is day 5 of IV antibiotics. Will transition to PO augmentin.  Sepsis: Lactic acidosis at Lactic Acid at 3.3, hypotension, and positive procalcitonin. Blood cultures x 2 have had no growth. Pt is receiving oral augmentin after being transitioned from IV Zosyn. Lactic acid is slowly improving with repeated boluses.   Mild Volume Overload: BMP is somewhat elevated at 229.5. Doubt significant overload. Lasix stopped. Echocardiogram performed on 03/13/2019 demonstrated EF of 60-65% and grad 1 diastolic dysfunction. No wall motion abnormalities and normal right ventricular function.   Severe recurrent seizures: Due to hypoglycemia per neurology. Pt also is dependent upon alcohol. Neurology does not see epilleptiform abnormalities on EEG. They do not recommend continuation of epilepsy medication. They also do not recommend further evaluation unless the patient presents with unprovoked seizures. Keppra has been discontinued.  Postictal period: Resolved. Due to recurrent seizures as above. Encephalopathy related to post-ictal period is waning.   Alcohol dependence and abuse: CIWA protocol for withdrawal.   Euthyroid Sick Syndrome: Low TSH. Normal FT4 with mildly low FT3.   Diabetes mellitus type 2, poorly controlled due to hyperglycemia/hypoglycemia: Glucoses to be managed with FSBS and SSI as well as Levemir bid which has been increased from 5 units to 10 due to hyperglycemia. Oral glypizide has been held.Glucoses for the last 24 hours have run between 37 and 323.    Hypothermia: Resolved. Due to hypoglycemia/sepsis. Monitor. 36.4 this am.  Prolonged QTC: Admission QTC 646.  Improved on repeat EKG after supplementing electrolytes.  Concern for hepatic encephalopathy: Due to patient's ETOH history and mildly elevated ammonium level. Will stop lactulose as level of alertness is improved.  Hypokalemia/hypomagnesemia: Supplement and Monitor..  Daily tobacco use: Nicotine patch  Essential hypertension: Blood pressures actually low due to sepsis. Antihypertensives held. Monitor.  GERD: Continue PPI as oral.  I have seen and examined this patient myself. I have spent 32 minutes in his evaluation and care.  DVT prophylaxis: Lovenox Code Status: Full Family Communication: None Disposition Plan: Patient is from home. He lives alone.  Recommendation from PT/OT is for SNF. Discharge to home is not a safe discharge plan.  Family is in agreement.   Earla Charlie, DO [facility] Direct contact: see [email or web address]  7PM-7AM contact night coverage as above 03/15/2019, 2:11 PM  LOS: 2 days

## 2019-03-16 ENCOUNTER — Inpatient Hospital Stay (HOSPITAL_COMMUNITY): Payer: Medicare (Managed Care)

## 2019-03-16 LAB — GLUCOSE, CAPILLARY
Glucose-Capillary: 167 mg/dL — ABNORMAL HIGH (ref 70–99)
Glucose-Capillary: 173 mg/dL — ABNORMAL HIGH (ref 70–99)
Glucose-Capillary: 189 mg/dL — ABNORMAL HIGH (ref 70–99)
Glucose-Capillary: 198 mg/dL — ABNORMAL HIGH (ref 70–99)
Glucose-Capillary: 253 mg/dL — ABNORMAL HIGH (ref 70–99)
Glucose-Capillary: 313 mg/dL — ABNORMAL HIGH (ref 70–99)
Glucose-Capillary: 67 mg/dL — ABNORMAL LOW (ref 70–99)

## 2019-03-16 LAB — CBC
HCT: 31.2 % — ABNORMAL LOW (ref 39.0–52.0)
Hemoglobin: 10.7 g/dL — ABNORMAL LOW (ref 13.0–17.0)
MCH: 33.2 pg (ref 26.0–34.0)
MCHC: 34.3 g/dL (ref 30.0–36.0)
MCV: 96.9 fL (ref 80.0–100.0)
Platelets: 161 10*3/uL (ref 150–400)
RBC: 3.22 MIL/uL — ABNORMAL LOW (ref 4.22–5.81)
RDW: 15.4 % (ref 11.5–15.5)
WBC: 9.9 10*3/uL (ref 4.0–10.5)
nRBC: 0 % (ref 0.0–0.2)

## 2019-03-16 LAB — BASIC METABOLIC PANEL
Anion gap: 9 (ref 5–15)
BUN: 7 mg/dL — ABNORMAL LOW (ref 8–23)
CO2: 21 mmol/L — ABNORMAL LOW (ref 22–32)
Calcium: 7.7 mg/dL — ABNORMAL LOW (ref 8.9–10.3)
Chloride: 112 mmol/L — ABNORMAL HIGH (ref 98–111)
Creatinine, Ser: 0.61 mg/dL (ref 0.61–1.24)
GFR calc Af Amer: >60 mL/min (ref >60)
GFR calc non Af Amer: >60 mL/min (ref >60)
Glucose, Bld: 277 mg/dL — ABNORMAL HIGH (ref 70–99)
Potassium: 3.8 mmol/L (ref 3.5–5.1)
Sodium: 142 mmol/L (ref 135–145)

## 2019-03-16 LAB — MAGNESIUM: Magnesium: 1.7 mg/dL (ref 1.7–2.4)

## 2019-03-16 MED ORDER — LORAZEPAM 1 MG PO TABS
1.0000 mg | ORAL_TABLET | Freq: Four times a day (QID) | ORAL | Status: DC | PRN
  Administered 2019-03-17 (×2): 1 mg via ORAL
  Filled 2019-03-16 (×2): qty 1

## 2019-03-16 MED ORDER — OXYCODONE-ACETAMINOPHEN 5-325 MG PO TABS
1.0000 | ORAL_TABLET | Freq: Four times a day (QID) | ORAL | Status: DC | PRN
  Administered 2019-03-16 – 2019-03-19 (×6): 1 via ORAL
  Filled 2019-03-16 (×4): qty 1
  Filled 2019-03-16: qty 2
  Filled 2019-03-16: qty 1

## 2019-03-16 MED ORDER — TRAMADOL HCL 50 MG PO TABS
50.0000 mg | ORAL_TABLET | Freq: Once | ORAL | Status: AC
  Administered 2019-03-16: 05:00:00 50 mg via ORAL
  Filled 2019-03-16: qty 1

## 2019-03-16 NOTE — Progress Notes (Addendum)
Approx 1545 staff witnessed patient fall in hallway near nurse's station; using front wheel walker and wearing no slip socks. Staff assisted patient back to room. Patient became increasingly agitated with staff interaction/assistance and assessment. Patient cursing, yelling (with hoarse voice) and demanding staff leave room. Patient gradually able to be calmed with RN and NT interaction.   DO Swayze paged approximately 1548.   Upon callback from [facility]; informed of witnssesed fall, did not hit head, will adminster PRN Ativan given agitation. Informed hit left shoulder; will order xray of shoulder. Will continue to monitor patient.  1605 reassessment post fall. Patient report pain in left shoulder 5/10 scale, no bruising, no abrasion observed. Per witnesses; patient did not hit his head. Left shoulder tender to palpation. Upon calming patient and inquiring about agitation/frustration, patient indicated upset that he was not provided with snacks/juice as requested. Snack and juice given to patient.  Patient left unit for xray at 1645. Patient still requesting additional snacks.   Approx 1700 received call from patient's sister Campbell Edmiston, informed of patient fall, hit left shoulder, but did not hit head and now in xray to assess shoulder. Sylvain inquired when patient may be discharged, informed unsure and doctor has to ok patient for discharge. Informed to follow up with sister [name]. Informed will have care team follow up with [name] about discharge plan.   1703 pt return from xray. RN to bedside. A&OX4. Informed spoke with sister Earlie Server and will call [name] per patient request. Patient placed on low bed, floor mats in place, bed alarm set. Patient still requesting food and drink; informed dinner wil be served shortly. VS assessed. Left shoulder still tender. Educated patient on calling and waiting for staff presence before exting bed.   Paged Swayze at 1753.Left shoulder xray resulted:  negative.Please reorder PRN Ativan, fell off MAR after 1605 dose given.  Called [name] sister at 17. Informed patient fall, shoulder left sore, but no dislocation. Xray complete. Informed patient agitaiton with requesting food/drink and frequently educated patient on need to monitor intake given DM. Informed will have care team follow up with d/c date and plan.

## 2019-03-16 NOTE — Plan of Care (Signed)
  Problem: Education: Goal: Knowledge of General Education information will improve Description: Including pain rating scale, medication(s)/side effects and non-pharmacologic comfort measures Outcome: Progressing   Problem: Safety: Goal: Ability to remain free from injury will improve Outcome: Progressing   

## 2019-03-16 NOTE — Progress Notes (Signed)
Patient reported hunger throughout day and not satisfied with portions received for meals. RN ordered second serving of grits for breakfast and second serving mac/cheese and potatoes for lunch per patient request. Patient still dissatisfied with portions.    Patient requested snacks (juice and crackers) frequently, aproximately every hour, from staff. Appropriate snacks offered by staff upon request. Educated patient on dysphagia diet and need to manage intake due to DM.   1323 during patient rounds, patient eating second serving of lunch as requested. Agitated with interaction, informed not able to provide crackers. Patient able to be calmed.

## 2019-03-16 NOTE — Progress Notes (Signed)
Hypoglycemic Event  CBG: 67  Treatment: 4oz of juice and graham crackers  Symptoms: None  Follow-up CBG: Time:0025 CBG Result:198  Possible Reasons for Event: N/a     Sue Lush

## 2019-03-16 NOTE — Progress Notes (Addendum)
Patient came up to the nursing station requesting cookies, cake and ice tea. Patient was informed that we do not have ice tea however he agreed to take apple juice instead. When apple juice was given he requested again for cookies and cake.Patient was asking all day for extra food and drinks and got upset every time when he was not accommodated because we do not have certain food on the floor. Nurse informed the pateint that we do not have cookies and cake on the unit and he needs to call cafeteria and if his diet allows he might be able to order it. Patient got upset and agitated, turned around to go back to his room and lost his balance. He fell and landed on his side, without hitting his head. Fall was witnessed by several nurses and Retail banker. Pateint continued to be upset and agitated refusing assistance stating " Leave me alone, I can do it by myself".  Escorted patient back to his room. Primary nurse informed. Low bed and floor mats in place.

## 2019-03-16 NOTE — Progress Notes (Signed)
PROGRESS NOTE  Michael [name] [id number] DOB: 1955-11-29 DOA: 03/10/2019 PCP: Helane Rima, MD  Brief History   64 year old with history of alcohol abuse, tobacco use, DM2, HTN, GERD, hepatitis B, bipolar disorder, pancreatitis, recent seizures requiring intubation about 2 weeks ago ended up leaving [place] presents to the hospital after having witnessed seizure at home.  He was noted to be hypoglycemic, hypothermic, elevated lactate.  CT head was negative.  Neurology team was consulted.  Neurology feels that the patient's recurrent seizures are due to his hypoglycemia.  EEG did not reveal epilleptiform abnormalities and they do not recommend epileptic therapy or further evaluation unless he has an unprovoked seizures in the futures.   The patient has been evaluated by PT/OT. They have recommended SNF placement and family is in agreement. TOC consulted for assistance.  The patient has requested that his pain medication be restarted. This has been done. No new complaints. Pain is chronic.  Consultants  . Neurology  Procedures  . EEG - No epileptiform abnormality. No epileptic therapy.   Antibiotics   Anti-infectives (From admission, onward)   Start     Dose/Rate Route Frequency Ordered Stop   03/15/19 1415  amoxicillin-clavulanate (AUGMENTIN) 875-125 MG per tablet 1 tablet     1 tablet Oral Every 12 hours 03/15/19 1409     03/10/19 2200  piperacillin-tazobactam (ZOSYN) IVPB 3.375 g  Status:  Discontinued     3.375 g 12.5 mL/hr over 240 Minutes Intravenous Every 8 hours 03/10/19 1858 03/15/19 1409   03/10/19 1430  piperacillin-tazobactam (ZOSYN) IVPB 3.375 g     3.375 g 100 mL/hr over 30 Minutes Intravenous  Once 03/10/19 1416 03/10/19 1740     Subjective  The patient is resting quietly. No new complaints.  Objective   Vitals:  Vitals:   03/16/19 0743 03/16/19 0839  BP: 113/86   Pulse: 83   Resp: 18   Temp:    SpO2: 100% 100%    Exam:  Constitutional:  . The patient is awake, alert, and oriented x 3. No acute distress. Respiratory:  . No increased work of breathing. . No wheezes, rales, or rhonchi . No tactile fremitus Cardiovascular:  . Regular rate and rhythm . No murmurs, ectopy, or gallups. . No lateral PMI. No thrills. Abdomen:  . Abdomen is soft, non-tender, non-distended . No hernias, masses, or organomegaly . Normoactive bowel sounds.  Musculoskeletal:  . No cyanosis, clubbing, or edema Skin:  . No rashes, lesions, ulcers . palpation of skin: no induration or nodules Neurologic:  . CN 2-12 intact . Sensation all 4 extremities intact Psychiatric:  . Mental status: The patient is awake and alert.   I have personally reviewed the following:   Today's Data  . Vitals, BMP, CBC  Micro Data  . Blood cultures: no growth x 2 days. . Urine cultures: no growth   Imaging  . CXR Right lower lobe opacity .  Scheduled Meds: . amoxicillin-clavulanate  1 tablet Oral Q12H  . budesonide (PULMICORT) nebulizer solution  0.5 mg Nebulization BID  . enoxaparin (LOVENOX) injection  40 mg Subcutaneous Q24H  . feeding supplement (ENSURE ENLIVE)  237 mL Oral BID BM  . folic acid  1 mg Oral Daily  . insulin aspart  0-9 Units Subcutaneous Q4H  . insulin detemir  6 Units Subcutaneous BID  . methylPREDNISolone (SOLU-MEDROL) injection  40 mg Intravenous Q12H  . multivitamin with minerals  1 tablet Oral Daily  . pantoprazole  40 mg Oral  Daily  . sodium chloride flush  3 mL Intravenous Q12H  . thiamine  100 mg Oral Daily   Or  . thiamine  100 mg Intravenous Daily   Continuous Infusions: . sodium chloride 1,000 mL (03/14/19 [id number])    Principal Problem:   Acute metabolic encephalopathy Active Problems:   Type 2 diabetes mellitus with peripheral neuropathy (HCC)   Chronic alcoholic pancreatitis (HCC)   Protein-calorie malnutrition, severe   Tobacco abuse   Hypoglycemia   Alcohol use disorder, severe,  dependence ([place])   Seizure ([place])   Hypothermia   Sepsis ([place])   LOS: 6 days   A & P  Acute respiratory distress with coughing due to acute COPD exacerbation: Resolved. The patient is receiving aggressive bronchodilators and solu-medrol.  Incentive spirometer and flutter valve are being utilized. He is COVID - 19 negative. The patient is saturating at 100% on room air at this time.  Aspiration pneumonia: Resulting in COPD exacerbation. Persistent Right lower lobe opacity due to aspiration pneumonia. The patient is on IV Zosyn and aspiration precautions. Procalcitonin positive at 1.85. Today is day 5 of IV antibiotics. Will transition to PO augmentin.  Sepsis: Lactic acidosis at Lactic Acid at 3.3, hypotension, and positive procalcitonin. Blood cultures x 2 have had no growth. Pt is receiving oral augmentin after being transitioned from IV Zosyn. Lactic acid is slowly improving with repeated boluses.   Mild Volume Overload: BMP is somewhat elevated at 229.5. Doubt significant overload. Lasix stopped. Echocardiogram performed on 03/13/2019 demonstrated EF of 60-65% and grad 1 diastolic dysfunction. No wall motion abnormalities and normal right ventricular function.   Severe recurrent seizures: Due to hypoglycemia per neurology. Pt also is dependent upon alcohol. Neurology does not see epilleptiform abnormalities on EEG. They do not recommend continuation of epilepsy medication. They also do not recommend further evaluation unless the patient presents with unprovoked seizures. Keppra has been discontinued.  Postictal period: Resolved. Due to recurrent seizures as above. Encephalopathy related to post-ictal period is waning.   Alcohol dependence and abuse: CIWA protocol for withdrawal.   Euthyroid Sick Syndrome: Low TSH. Normal FT4 with mildly low FT3.   Diabetes mellitus type 2, poorly controlled due to hyperglycemia/hypoglycemia: Glucoses to be managed with FSBS and SSI as well as Levemir bid  which has been increased from 5 units to 6 due to hyperglycemia. Oral glypizide has been held.Glucoses for the last 24 hours have run between 167 and 313.   Hypothermia: Resolved. Due to hypoglycemia/sepsis. Monitor. 36.4 this am.  Prolonged QTC: Admission QTC 646.  Improved on repeat EKG after supplementing electrolytes.  Concern for hepatic encephalopathy: Due to patient's ETOH history and mildly elevated ammonium level. Will stop lactulose as level of alertness is improved.  Hypokalemia/hypomagnesemia: Supplement and Monitor.  Daily tobacco use: Nicotine patch  Essential hypertension: Blood pressures actually low due to sepsis. Antihypertensives held. Monitor.  GERD: Continue PPI as oral.  I have seen and examined this patient myself. I have spent 30 minutes in his evaluation and care.  DVT prophylaxis: Lovenox Code Status: Full Family Communication: None Disposition Plan: Patient is from home. He lives alone.  Recommendation from PT/OT is for SNF. Discharge to home is not a safe discharge plan.  Family is in agreement.   Jimi Giza, DO [facility] Direct contact: see [email or web address]  7PM-7AM contact night coverage as above 03/16/2019, 2:10 PM  LOS: 2 days

## 2019-03-16 NOTE — Progress Notes (Addendum)
03/16/19 1600  What Happened  Was fall witnessed? Yes  Who witnessed fall?  (staff present at nurse's station)  Patients activity before fall ambulating-unassisted (with front wheel walker)  Point of contact arm/shoulder  Was patient injured? Yes  Follow Up  MD notified Swayze  Time MD notified [phone]  Family notified Yes - comment (sisters Earlie Server and [name] (see RN note))  Time family notified 1700  Additional tests Yes-comment (xray of left shoulder)  Simple treatment Other (comment) (comfort)  Progress note created (see row info) Yes  Adult Fall Risk Assessment  Risk Factor Category (scoring not indicated) Fall has occurred during this admission (document High fall risk)  Age 64  Fall History: Fall within 6 months prior to admission 5 (per pt. 3 months prior)  Elimination; Bowel and/or Urine Incontinence 2  Elimination; Bowel and/or Urine Urgency/Frequency 2  Medications: includes PCA/Opiates, Anti-convulsants, Anti-hypertensives, Diuretics, Hypnotics, Laxatives, Sedatives, and Psychotropics 5  Patient Care Equipment 0  Mobility-Assistance 2  Mobility-Gait 2  Mobility-Sensory Deficit 0  Altered awareness of immediate physical environment 0  Impulsiveness 2  Lack of understanding of one's physical/cognitive limitations 4  Total Score 25  Patient Fall Risk Level High fall risk  Adult Fall Risk Interventions  Required Bundle Interventions *See Row Information* High fall risk - low, moderate, and high requirements implemented  Additional Interventions Use of appropriate toileting equipment (bedpan, BSC, etc.)  Screening for Fall Injury Risk (To be completed on HIGH fall risk patients) - Assessing Need for Low Bed  Risk For Fall Injury- Low Bed Criteria Previous fall this admission (fall 03/16/2019)  Will Implement Low Bed and Floor Mats Yes  Screening for Fall Injury Risk (To be completed on HIGH fall risk patients who do not meet crieteria for Low Bed) - Assessing Need for  Floor Mats Only  Risk For Fall Injury- Criteria for Floor Mats Confusion/dementia (+NuDESC, CIWA, TBI, etc.);Noncompliant with safety precautions  Will Implement Floor Mats Yes  Pain Assessment  Pain Scale 0-10  Pain Score 5  Pain Type Acute pain;Chronic pain  Pain Location Shoulder  Pain Orientation Left  Pain Descriptors / Indicators Aching  Pain Frequency Constant  Pain Onset On-going  Pain Intervention(s) MD notified (Comment);RN made aware  Neurological  Neuro (WDL) X  Level of Consciousness Alert  Orientation Level Oriented X4  Cognition Appropriate attention/concentration;Poor judgement;Poor safety awareness  Speech Clear (hoarse)  Pupil Assessment  Yes  R Pupil Size (mm) 3  R Pupil Shape Round  R Pupil Reaction Brisk  L Pupil Size (mm) 3  L Pupil Shape Round  L Pupil Reaction Brisk  Additional Pupil Assessments No  Motor Function/Sensation Assessment Grip  R Hand Grip Present;Strong  L Hand Grip Present;Strong  R Foot Dorsiflexion Present  L Foot Dorsiflexion Present  R Foot Plantar Flexion Present;Moderate  L Foot Plantar Flexion Present;Moderate  RUE Motor Response Purposeful movement  RUE Motor Strength 5  LUE Motor Response Purposeful movement  LUE Motor Strength 5  RLE Motor Response Purposeful movement  LLE Motor Response Purposeful movement  Neuro Symptoms Agitation  Glasgow Coma Scale  Eye Opening 4  Best Verbal Response (NON-intubated) 5  Best Motor Response 6  Glasgow Coma Scale Score 15  Musculoskeletal  Musculoskeletal (WDL) X  Assistive Device Front wheel walker  Generalized Weakness Yes  Weight Bearing Restrictions No   Patient did not hit head, left shoulder x ray ordered. Educated patient on fall safety and RN attempt to address patient agitation. See RN  notes

## 2019-03-17 LAB — GLUCOSE, CAPILLARY
Glucose-Capillary: 128 mg/dL — ABNORMAL HIGH (ref 70–99)
Glucose-Capillary: 140 mg/dL — ABNORMAL HIGH (ref 70–99)
Glucose-Capillary: 169 mg/dL — ABNORMAL HIGH (ref 70–99)
Glucose-Capillary: 227 mg/dL — ABNORMAL HIGH (ref 70–99)
Glucose-Capillary: 273 mg/dL — ABNORMAL HIGH (ref 70–99)

## 2019-03-17 MED ORDER — QUETIAPINE FUMARATE 25 MG PO TABS: 25.0000 mg | ORAL_TABLET | Freq: Every day | ORAL | Status: DC | PRN

## 2019-03-17 MED ORDER — QUETIAPINE FUMARATE 25 MG PO TABS
25.0000 mg | ORAL_TABLET | Freq: Every day | ORAL | Status: DC
  Administered 2019-03-17 – 2019-03-18 (×2): 25 mg via ORAL
  Filled 2019-03-17 (×3): qty 1

## 2019-03-17 NOTE — Plan of Care (Signed)
  Problem: Safety: Goal: Ability to remain free from injury will improve Outcome: Not Progressing  Pt. Refuses bed alarm. Will not use call light to call for assistance. OOB bed unassisted.

## 2019-03-17 NOTE — Progress Notes (Addendum)
Patient refused AM insulin, patient repeatedly cursing, demanding food and to go home.   RN ordered second serving of dysphagia breakfast and lunch side dishes given patient's appetite/requests for food.    During med admin and AM assessment patient frustrated/agitated regarding food portions and desire to go home. RN reiterated to patient that he is on a dysphagia diet and has DM, therefore need to manage intake amount and consistency of food for patient safety. Informed patient staff were not trying to deny him food nor ignore his requests. Patient expressed understanding and communicated that he gets frustrated at times. RN encourage patient to rest as he appeared tired, patient agreed to take a nap.   Spoke with Michael [name] face to face at approx 1015 and informed patient refused AM labs.   During reassessment after taking a nap, patient expressed remorse for prior behavior and acknowledged he gets frustrated at times. Patient appeared more rested, calmer and willing to accept staff assistance.   1456 during rounds patient agitated, stated did not receive requested snack (sandwich). Patient cursing and slamming on bedside table. RN advised patient behavior not acceptable and provided appropriate snack.   36 spoke with Michael [name] about patient behavior management given frequent episodes of anger/frustration about diet. Informed of many attempts to offer therapeutic communication and meet patient needs. Will order Seroquel.   Approx 1600 patient agitated again requesting food (sandwich) and to leave AMA. RN able to calm patient and addressed concerns regarding dysphagia diet. Educated patient on risks of eating non diet specific foods and risk of aspiration pneumonia. Patient receptive to information. Patient then stated did not want to leave AMA.

## 2019-03-17 NOTE — Progress Notes (Signed)
PROGRESS NOTE  Michael [name] [id number] DOB: 06-28-1955 DOA: 03/10/2019 PCP: Helane Rima, MD  Brief History   64 year old with history of alcohol abuse, tobacco use, DM2, HTN, GERD, hepatitis B, bipolar disorder, pancreatitis, recent seizures requiring intubation about 2 weeks ago ended up leaving [place] presents to the hospital after having witnessed seizure at home.  He was noted to be hypoglycemic, hypothermic, elevated lactate.  CT head was negative.  Neurology team was consulted.  Neurology feels that the patient's recurrent seizures are due to his hypoglycemia.  EEG did not reveal epilleptiform abnormalities and they do not recommend epileptic therapy or further evaluation unless he has an unprovoked seizures in the futures.   The patient has requested that his pain medication be restarted. This has been done. No new complaints. Pain is chronic.  The patient has been evaluated by PT/OT. They have recommended SNF placement and family is in agreement. TOC consulted for assistance.  Consultants  . Neurology  Procedures  . EEG - No epileptiform abnormality. No epileptic therapy.   Antibiotics   Anti-infectives (From admission, onward)   Start     Dose/Rate Route Frequency Ordered Stop   03/15/19 1415  amoxicillin-clavulanate (AUGMENTIN) 875-125 MG per tablet 1 tablet     1 tablet Oral Every 12 hours 03/15/19 1409     03/10/19 2200  piperacillin-tazobactam (ZOSYN) IVPB 3.375 g  Status:  Discontinued     3.375 g 12.5 mL/hr over 240 Minutes Intravenous Every 8 hours 03/10/19 1858 03/15/19 1409   03/10/19 1430  piperacillin-tazobactam (ZOSYN) IVPB 3.375 g     3.375 g 100 mL/hr over 30 Minutes Intravenous  Once 03/10/19 1416 03/10/19 1740     Subjective  The patient is resting quietly. No new complaints.  Objective   Vitals:  Vitals:   03/17/19 0924 03/17/19 1309  BP:  106/80  Pulse:  100  Resp:    Temp:  97.8 F (36.6 C)  SpO2: 98% 99%    Exam:  Constitutional:  . The patient is awake, alert, and oriented x 3. No acute distress. Respiratory:  . No increased work of breathing. . No wheezes, rales, or rhonchi . No tactile fremitus Cardiovascular:  . Regular rate and rhythm . No murmurs, ectopy, or gallups. . No lateral PMI. No thrills. Abdomen:  . Abdomen is soft, non-tender, non-distended . No hernias, masses, or organomegaly . Normoactive bowel sounds.  Musculoskeletal:  . No cyanosis, clubbing, or edema Skin:  . No rashes, lesions, ulcers . palpation of skin: no induration or nodules Neurologic:  . CN 2-12 intact . Sensation all 4 extremities intact Psychiatric:  . Mental status: The patient is awake and alert.   I have personally reviewed the following:   Today's Data  . Vitals, glucoses  Micro Data  . Blood cultures: no growth x 2 days. . Urine cultures: no growth   Imaging  . CXR Right lower lobe opacity .  Scheduled Meds: . amoxicillin-clavulanate  1 tablet Oral Q12H  . budesonide (PULMICORT) nebulizer solution  0.5 mg Nebulization BID  . enoxaparin (LOVENOX) injection  40 mg Subcutaneous Q24H  . feeding supplement (ENSURE ENLIVE)  237 mL Oral BID BM  . folic acid  1 mg Oral Daily  . insulin aspart  0-9 Units Subcutaneous Q4H  . insulin detemir  6 Units Subcutaneous BID  . methylPREDNISolone (SOLU-MEDROL) injection  40 mg Intravenous Q12H  . multivitamin with minerals  1 tablet Oral Daily  . pantoprazole  40  mg Oral Daily  . sodium chloride flush  3 mL Intravenous Q12H  . thiamine  100 mg Oral Daily   Or  . thiamine  100 mg Intravenous Daily   Continuous Infusions: . sodium chloride 1,000 mL (03/14/19 [id number])    Principal Problem:   Acute metabolic encephalopathy Active Problems:   Type 2 diabetes mellitus with peripheral neuropathy (HCC)   Chronic alcoholic pancreatitis (HCC)   Protein-calorie malnutrition, severe   Tobacco abuse   Hypoglycemia   Alcohol use disorder, severe,  dependence ([place])   Seizure ([place])   Hypothermia   Sepsis ([place])   LOS: 7 days   A & P  Acute respiratory distress with coughing due to acute COPD exacerbation: Resolved. The patient is receiving aggressive bronchodilators and solu-medrol.  Incentive spirometer and flutter valve are being utilized. He is COVID - 19 negative. The patient is saturating at 100% on room air at this time.  Aspiration pneumonia: Resulting in COPD exacerbation. Persistent Right lower lobe opacity due to aspiration pneumonia. The patient is on IV Zosyn and aspiration precautions. Procalcitonin positive at 1.85. He has completed 5 days of IV antibiotics. He has been transitioned to PO augmentin.  Sepsis: Lactic acidosis at Lactic Acid at 3.3, hypotension, and positive procalcitonin. Blood cultures x 2 have had no growth. Pt is receiving oral augmentin after being transitioned from IV Zosyn. Lactic acid is slowly improving with repeated boluses.   Mild Volume Overload: BMP is somewhat elevated at 229.5. Doubt significant overload. Lasix stopped. Echocardiogram performed on 03/13/2019 demonstrated EF of 60-65% and grad 1 diastolic dysfunction. No wall motion abnormalities and normal right ventricular function.   Severe recurrent seizures: Due to hypoglycemia per neurology. Pt also is dependent upon alcohol. Neurology does not see epilleptiform abnormalities on EEG. They do not recommend continuation of epilepsy medication. They also do not recommend further evaluation unless the patient presents with unprovoked seizures. Keppra has been discontinued.  Postictal period: Resolved. Due to recurrent seizures as above. Encephalopathy related to post-ictal period is waning.   Alcohol dependence and abuse: CIWA protocol for withdrawal.   Euthyroid Sick Syndrome: Low TSH. Normal FT4 with mildly low FT3.   Diabetes mellitus type 2, poorly controlled due to hyperglycemia/hypoglycemia: Glucoses to be managed with FSBS and SSI as well  as Levemir bid which has been increased from 5 units to 6 due to hyperglycemia. Oral glypizide has been held.Glucoses for the last 24 hours have run between 167 and 313.   Hypothermia: Resolved. Due to hypoglycemia/sepsis. Monitor. 36.4 this am.  Prolonged QTC: Admission QTC 646.  Improved on repeat EKG after supplementing electrolytes.  Concern for hepatic encephalopathy: Due to patient's ETOH history and mildly elevated ammonium level. Will stop lactulose as level of alertness is improved.  Hypokalemia/hypomagnesemia: Supplement and Monitor.  Daily tobacco use: Nicotine patch  Essential hypertension: Blood pressures actually low due to sepsis. Antihypertensives held. Monitor.  GERD: Continue PPI as oral.  I have seen and examined this patient myself. I have spent 32 minutes in his evaluation and care.  DVT prophylaxis: Lovenox Code Status: Full Family Communication: None Disposition Plan: Patient is from home. He lives alone.  Recommendation from PT/OT is for SNF. Discharge to home is not a safe discharge plan.  Family is in agreement.   Giordano Getman, DO [facility] Direct contact: see [email or web address]  7PM-7AM contact night coverage as above 03/17/2019, 1:17 PM  LOS: 2 days

## 2019-03-17 NOTE — Progress Notes (Signed)
Physical Therapy Treatment Patient Details Name: Michael [name] MRN: [id number] DOB: 06/15/1955 Today's Date: 03/17/2019    History of Present Illness Pt is a 64 y/o male admitted secondary to COPD exacerbation and sepsis. Pt also with seizure like activity prior to admission. CT of head negative for acute abnormality. PMH includes DM, alcohol abuse, tobacco use, HTN, hepatitis B, bipolar disorder, and pancreatitis.     PT Comments    Pt sitting EOB upon arrival of PT, agreeable to PT session at this time with focus on mobility progression and dynamic stability. The pt continues to present with limitations in functional mobility and dynamic stability compared to his prior level of function and independence due to above dx. The pt sustained a fall yesterday 2/21 while ambulating unassisted in the hallway, and therefore most of todays session was focused on reacting to external stimuli and other challenges such as head turns and SLS while walking. The pt was able to complete all mobility with supervision and good use of RW. The pt will continue to benefit from mobilization with RN staff as available as well as continued skilled PT to progress functional stability.     Follow Up Recommendations  SNF;Supervision/Assistance - 24 hour(vs. HHPT if pt progresses and family can be present 24/7)     Equipment Recommendations  None recommended by PT    Recommendations for Other Services       Precautions / Restrictions Precautions Precautions: Fall Precaution Comments: pt fell while ambulating in hallway unsupervised 2/21 Restrictions Weight Bearing Restrictions: No    Mobility  Bed Mobility Overal bed mobility: Needs Assistance             General bed mobility comments: pt sitting EOB upon PT arrival  Transfers Overall transfer level: Needs assistance Equipment used: Rolling walker (2 wheeled) Transfers: Sit to/from Stand Sit to Stand: Supervision         General transfer  comment: pt able to stand with RW and supervision, no assist needed.  Ambulation/Gait Ambulation/Gait assistance: Supervision Gait Distance (Feet): 200 Feet Assistive device: Rolling walker (2 wheeled) Gait Pattern/deviations: Step-through pattern;Trunk flexed;Narrow base of support;Decreased stride length   Gait velocity interpretation: 1.31 - 2.62 ft/sec, indicative of limited community ambulator General Gait Details: supervision for ambulation in hallway, no assist needed. Pt challenged with head turns, slight lateral drift with head movements, no LOB   Stairs             Wheelchair Mobility    Modified Rankin (Stroke Patients Only)       Balance Overall balance assessment: Needs assistance Sitting-balance support: No upper extremity supported Sitting balance-Leahy Scale: Normal Sitting balance - Comments: mod I   Standing balance support: During functional activity;No upper extremity supported Standing balance-Leahy Scale: Fair Standing balance comment: able to stand without UE support, uses BUE support for ambulation, able to bend over to retrieve items from floor without assist                            Cognition Arousal/Alertness: Awake/alert Behavior During Therapy: [facility] for tasks assessed/performed Overall Cognitive Status: No family/caregiver present to determine baseline cognitive functioning                                 General Comments: Pt with improved cooperation and behavior. No issues today, pleasant with all staff.  Exercises General Exercises - Lower Extremity Heel Raises: AROM;Both;15 reps;Standing Mini-Sqauts: Strengthening;10 reps    General Comments        Pertinent Vitals/Pain Pain Assessment: No/denies pain    Home Living                      Prior Function            PT Goals (current goals can now be found in the care plan section) Acute Rehab PT Goals Patient Stated Goal: to get my  lunch  PT Goal Formulation: With patient Time For Goal Achievement: 03/27/19 Potential to Achieve Goals: Good Progress towards PT goals: Progressing toward goals    Frequency    Min 2X/week      PT Plan Current plan remains appropriate    Co-evaluation              AM-PAC PT "6 Clicks" Mobility   Outcome Measure  Help needed turning from your back to your side while in a flat bed without using bedrails?: A Little Help needed moving from lying on your back to sitting on the side of a flat bed without using bedrails?: A Little Help needed moving to and from a bed to a chair (including a wheelchair)?: A Little Help needed standing up from a chair using your arms (e.g., wheelchair or bedside chair)?: A Little Help needed to walk in hospital room?: A Little Help needed climbing 3-5 steps with a railing? : A Lot 6 Click Score: 17    End of Session Equipment Utilized During Treatment: Gait belt Activity Tolerance: Patient tolerated treatment well Patient left: in bed;with bed alarm set;with call bell/phone within reach Nurse Communication: Mobility status PT Visit Diagnosis: Unsteadiness on feet (R26.81);Muscle weakness (generalized) (M62.81)     Time: [id number] PT Time Calculation (min) (ACUTE ONLY): 28 min  Charges:  $Gait Training: 8-22 mins $Therapeutic Exercise: 8-22 mins                     Karma Ganja, PT, DPT   Acute Rehabilitation Department Pager #: [phone]   Otho Bellows 03/17/2019, 3:18 PM

## 2019-03-17 NOTE — Progress Notes (Signed)
Inpatient Diabetes Program Recommendations  AACE/ADA: New Consensus Statement on Inpatient Glycemic Control (2015)  Target Ranges:  Prepandial:   less than 140 mg/dL      Peak postprandial:   less than 180 mg/dL (1-2 hours)      Critically ill patients:  140 - 180 mg/dL   Lab Results  Component Value Date   GLUCAP 273 (H) 03/17/2019   HGBA1C 14.8 (H) 03/11/2019    Review of Glycemic Control  Results for GEORDIE, HAKE (MRN [id number]) as of 03/17/2019 11:45  Ref. Range 03/16/2019 16:14 03/16/2019 20:04 03/17/2019 00:10 03/17/2019 04:53 03/17/2019 11:24  Glucose-Capillary Latest Ref Range: 70 - 99 mg/dL 253 (H) 173 (H) 227 (H) 140 (H) 273 (H)   Diabetes history: DM2 Outpatient Diabetes medications: Glipizide 10 mg QD+ Novolog 5-10 units TID + 5 units before breakfast and 10 units before lunch and dinner Current orders for Inpatient glycemic control: Levemir 6 units BID + Novolog 0-9 units Q4H + Solumedrol 40 mg Q 12 hours  Inpatient Diabetes Program Recommendations:     Noted that patient is eating.  -Please consider Novolog 0-9 units TID with meals and 0-5 QHS -Novolog 3 units TID with meals if eats at least 50% of meal  Thank you, Reche Dixon, RN, BSN Diabetes Coordinator Inpatient Diabetes Program [phone] (team pager from 8a-5p)

## 2019-03-17 NOTE — Progress Notes (Signed)
Speech Language Pathology Treatment: Dysphagia  Patient Details Name: Michael [name] MRN: [id number] DOB: 10-22-1955 Today's Date: 03/17/2019 Time: [id number] SLP Time Calculation (min) (ACUTE ONLY): 20 min  Assessment / Plan / Recommendation Clinical Impression  Pt seen for skilled ST during lunch meal. He was sitting edge of bed, able to self-feed dysphagia 1 solids and HTL tray items. ST provided verbal education re: his participation in [place] on 2/19.  In teach-back, patient verbalized understanding of rationale for current diet texture recommendations ("so I don't get sick") and verbalized strategies ("I need to slow down, take little bites and little sips, I usually eat too fast"). Pt demonstrated ability to take small bites of puree solids and used spoon to facilitate small sips of HTL with minimal verbal cues. Patient completed effortful swallows (with boluses of HTL) with min fading verbal cues needed.   ST completed oral care with patient and provided ice chips, pt instructed to "swallow hard" to facilitate effortful swallow. He was able to do so, no overt s/s aspiration. However, recent MBSS revealed silent aspiration of thin liquids, nectar thick liquids and dysphagia 2 solids.  At one point, patient requested thin liquid soda from ST. ST provided verbal education re: rationale for avoiding thin liquids at this time, patient verbalized understanding.   Recommend continuation of dysphagia 1 solids/Honey thick liquds. PO when alert, small bites/sips, supervise for adherence to strategies, sit upright for PO, oral care BID.  Pt may have 1 ice chip at a time from RN or family after thorough oral care, when sitting upright and alert.  Discussion w/ RN re: recommendations. She verbalized understanding, but reports patient expressed displeasure with dysphagia 1 solids texture yesterday and became agitated/asked for solids repeatedly. Patient was cooperative when seen by ST today. ST edu RN re:  MBSS reveals patient silently aspirated dysphagia 2 solids during recent 2/19 MBSS, dysphagia 1 solids are safest at this point. She verbalized understanding.  ST to follow as per POC.     HPI HPI: Pt is a 64 year old male, PMH of alcohol dependence, tobacco abuse, type II DM/IDDM, HTN, GERD, hepatitis B, pancreatitis, bipolar disorder, prior seizure disorder, recent hospital admission 02/26/2019 when he had presented with seizure, hyperglycemia, required intubation for airway protection and was extubated the next day and then left the hospital against medical advice. He presented to the ED with history of witnessed seizure at home and was unable to provide any history at that time due to altered mental status. Chest x-ray 03/11/19: persistent right lower lobe airspace opacity, not significantly changed from previous exam. CT of the head was negative. EEG: moderate diffuse encephalopathy of non-specific etiology.      SLP Plan  Continue with current plan of care       Recommendations  Diet recommendations: Dysphagia 1 (puree);Honey-thick liquid Liquids provided via: Cup;No straw Medication Administration: Whole meds with puree Supervision: Full supervision/cueing for compensatory strategies;Patient able to self feed Compensations: Minimize environmental distractions;Slow rate;Small sips/bites Postural Changes and/or Swallow Maneuvers: Seated upright 90 degrees                Oral Care Recommendations: Oral care prior to ice chip/H20;Oral care BID Follow up Recommendations: 24 hour supervision/assistance;Skilled Nursing facility SLP Visit Diagnosis: Dysphagia, oropharyngeal phase (R13.12) Plan: Continue with current plan of care       Michael [name] 03/17/2019, 1:44 PM  Marina Goodell, M.Ed., CCC-SLP Speech  Therapy Acute Rehabilitation [phone]: Acute Rehab office [phone] - pager

## 2019-03-18 LAB — GLUCOSE, CAPILLARY
Glucose-Capillary: 134 mg/dL — ABNORMAL HIGH (ref 70–99)
Glucose-Capillary: 18 mg/dL — CL (ref 70–99)
Glucose-Capillary: 195 mg/dL — ABNORMAL HIGH (ref 70–99)
Glucose-Capillary: 195 mg/dL — ABNORMAL HIGH (ref 70–99)
Glucose-Capillary: 267 mg/dL — ABNORMAL HIGH (ref 70–99)
Glucose-Capillary: 90 mg/dL (ref 70–99)
Glucose-Capillary: 95 mg/dL (ref 70–99)

## 2019-03-18 MED ORDER — RESOURCE THICKENUP CLEAR PO POWD
ORAL | Status: DC | PRN
  Filled 2019-03-18 (×2): qty 125

## 2019-03-18 MED ORDER — PREDNISONE 20 MG PO TABS
40.0000 mg | ORAL_TABLET | Freq: Every day | ORAL | Status: DC
  Administered 2019-03-19: 40 mg via ORAL
  Filled 2019-03-18 (×2): qty 2

## 2019-03-18 MED ORDER — PREDNISONE 20 MG PO TABS: 20.0000 mg | ORAL_TABLET | Freq: Every day | ORAL | Status: DC

## 2019-03-18 MED ORDER — INSULIN DETEMIR 100 UNIT/ML ~~LOC~~ SOLN
6.0000 [IU] | Freq: Every day | SUBCUTANEOUS | Status: DC
  Administered 2019-03-19: 6 [IU] via SUBCUTANEOUS
  Filled 2019-03-18: qty 0.06

## 2019-03-18 MED ORDER — PREDNISONE 5 MG PO TABS: 5.0000 mg | ORAL_TABLET | Freq: Every day | ORAL | Status: DC

## 2019-03-18 MED ORDER — DEXTROSE 50 % IV SOLN
INTRAVENOUS | Status: AC
  Administered 2019-03-18: 04:00:00 50 mL
  Filled 2019-03-18: qty 50

## 2019-03-18 MED ORDER — PREDNISONE 10 MG PO TABS: 10.0000 mg | ORAL_TABLET | Freq: Every day | ORAL | Status: DC

## 2019-03-18 NOTE — Progress Notes (Signed)
Occupational Therapy Treatment Patient Details Name: Michael [name] MRN: [id number] DOB: Jun 04, 1955 Today's Date: 03/18/2019    History of present illness Pt is a 64 y/o male admitted secondary to COPD exacerbation and sepsis. Pt also with seizure like activity prior to admission. CT of head negative for acute abnormality. PMH includes DM, alcohol abuse, tobacco use, HTN, hepatitis B, bipolar disorder, and pancreatitis.    OT comments  Pt progressing to OOB ADL with increased safety awareness. Pt performing task to rearrange linens on bed with no physical assist. Pt ambulating around bed to complete task with supervisionA advised. Pt stood at sink for grooming tasks with supervisionA.  Pt following commands, but higher level safety awareness could improve. Cognition to be assess continually to see if pt is at his baseline or if he has worsened in function. If pt has 24/7 at home, pt could return home with [name]. OT following acutely.    Follow Up Recommendations  SNF;Supervision/Assistance - 24 hour(Pt increasing in function; possible HHOT appropriate)    Equipment Recommendations  3 in 1 bedside commode;Other (comment)(to be determined)    Recommendations for Other Services      Precautions / Restrictions Precautions Precautions: Fall Precaution Comments: pt fell while ambulating in hallway unsupervised 2/21 Restrictions Weight Bearing Restrictions: No       Mobility Bed Mobility Overal bed mobility: Needs Assistance Bed Mobility: Supine to Sit     Supine to sit: Supervision     General bed mobility comments: no physical assist required  Transfers Overall transfer level: Needs assistance Equipment used: Rolling walker (2 wheeled) Transfers: Sit to/from Stand Sit to Stand: Supervision         General transfer comment: pt able to stand with RW and supervision, no assist needed.    Balance Overall balance assessment: Needs assistance Sitting-balance support: No upper  extremity supported Sitting balance-Leahy Scale: Normal     Standing balance support: During functional activity;No upper extremity supported Standing balance-Leahy Scale: Fair Standing balance comment: Pt bending over to change linens on bed.               High Level Balance Comments: making his bed           ADL either performed or assessed with clinical judgement   ADL Overall ADL's : Needs assistance/impaired Eating/Feeding: Set up;Sitting   Grooming: Supervision/safety;Standing               Lower Body Dressing: Supervision/safety;Sitting/lateral leans;Sit to/from stand   Toilet Transfer: Min guard;Cueing for safety;RW           Functional mobility during ADLs: Min guard;Rolling walker;Cueing for safety General ADL Comments: Pt performing task to rearrange linens on bed with no physical assist. Pt ambulating around bed to complete task with supervisionA advised. Pt stood at sink for grooming tasks with supervisionA.  Pt following commands, but higher level safety awareness could improve.     Vision       Perception     Praxis      Cognition Arousal/Alertness: Awake/alert Behavior During Therapy: WFL for tasks assessed/performed Overall Cognitive Status: No family/caregiver present to determine baseline cognitive functioning                                 General Comments: Pt followed all commands and pleasant behavior.        Exercises     Shoulder Instructions  General Comments Safest disposition is SNF, but pt may increase function and cognitive abilities while here.    Pertinent Vitals/ Pain       Pain Assessment: No/denies pain  Home Living                                          Prior Functioning/Environment              Frequency  Min 2X/week        Progress Toward Goals  OT Goals(current goals can now be found in the care plan section)  Progress towards OT goals: Progressing  toward goals  Acute Rehab OT Goals Patient Stated Goal: to make my bed OT Goal Formulation: With patient Time For Goal Achievement: 03/28/19 Potential to Achieve Goals: Good ADL Goals Pt Will Perform Grooming: with supervision;standing Pt Will Perform Lower Body Dressing: with supervision;sit to/from stand Pt Will Transfer to Toilet: ambulating;bedside commode;with min assist Pt Will Perform Toileting - Clothing Manipulation and hygiene: sit to/from stand;with supervision Additional ADL Goal #1: Pt will demonstrate anticipatory awareness during ADL routine to optimize safety.  Plan Discharge plan remains appropriate    Co-evaluation                 AM-PAC OT "6 Clicks" Daily Activity     Outcome Measure   Help from another person eating meals?: None Help from another person taking care of personal grooming?: None Help from another person toileting, which includes using toliet, bedpan, or urinal?: A Little Help from another person bathing (including washing, rinsing, drying)?: A Lot Help from another person to put on and taking off regular upper body clothing?: None Help from another person to put on and taking off regular lower body clothing?: A Little 6 Click Score: 20    End of Session Equipment Utilized During Treatment: Gait belt;Rolling walker  OT Visit Diagnosis: Other abnormalities of gait and mobility (R26.89);Muscle weakness (generalized) (M62.81);Other symptoms and signs involving cognitive function   Activity Tolerance Patient tolerated treatment well   Patient Left in chair;with call bell/phone within reach;with chair alarm set   Nurse Communication Mobility status        Time: [id number] OT Time Calculation (min): 21 min  Charges: OT General Charges $OT Visit: 1 Visit OT Treatments $Self Care/Home Management : 8-22 mins  Jefferey Pica, OTR/L Acute Rehabilitation Services Pager: [phone] Office: [phone]    Audie Pinto 03/18/2019,  4:14 PM

## 2019-03-18 NOTE — Plan of Care (Signed)
  Problem: Activity: Goal: Risk for activity intolerance will decrease Outcome: Progressing   Problem: Nutrition: Goal: Adequate nutrition will be maintained Outcome: Progressing   

## 2019-03-18 NOTE — Progress Notes (Signed)
Inpatient Diabetes Program Recommendations  AACE/ADA: New Consensus Statement on Inpatient Glycemic Control (2015)  Target Ranges:  Prepandial:   less than 140 mg/dL      Peak postprandial:   less than 180 mg/dL (1-2 hours)      Critically ill patients:  140 - 180 mg/dL   Lab Results  Component Value Date   GLUCAP 95 03/18/2019   HGBA1C 14.8 (H) 03/11/2019    Review of Glycemic Control Results for OMRI, CAPONI (MRN [id number]) as of 03/18/2019 11:28  Ref. Range 03/17/2019 20:14 03/18/2019 00:12 03/18/2019 04:04 03/18/2019 04:29 03/18/2019 07:49  Glucose-Capillary Latest Ref Range: 70 - 99 mg/dL 169 (H) 134 (H) 18 (LL) 90 95     Diabetes history: DM2 Outpatient Diabetes medications: Glipizide 10 mg QD+ Novolog 5-10 units TID + 5 units before breakfast and 10 units before lunch and dinner Current orders for Inpatient glycemic control: Levemir 6 units BID + Novolog 0-9 units Q4H + Solumedrol 40 mg Q 12 hours  Inpatient Diabetes Program Recommendations:     Noted that patient became hypoglycemic at 0404 this am 18mg /dl.   Changing correction to TID with meals as patient is eating will help reduce the risk of hypoglycemic episodes.    -Please consider Novolog 0-9 units TID with meals  Sent Dr. Benny Lennert a secure chat  Thank you, Michael Dixon, RN, BSN Diabetes Coordinator Inpatient Diabetes Program [phone] (team pager from 8a-5p)

## 2019-03-18 NOTE — Progress Notes (Signed)
Nutrition Follow-up  RD working remotely.  DOCUMENTATION CODES:   Non-severe (moderate) malnutrition in context of chronic illness, Underweight  INTERVENTION:   -Continue Ensure Enlive po BID, each supplement provides 350 kcal and 20 grams of protein -Continue MVI with minerals daily -Magic cup TID with meals, each supplement provides 290 kcal and 9 grams of protein  NUTRITION DIAGNOSIS:   Moderate Malnutrition related to chronic illness(pancreatitis) as evidenced by moderate fat depletion, moderate muscle depletion, mild muscle depletion, mild fat depletion, percent weight loss.  Ongoing  GOAL:   Patient will meet greater than or equal to 90% of their needs  Progressing   MONITOR:   PO intake, Supplement acceptance, Labs, Weight trends, Skin, I & O's  REASON FOR ASSESSMENT:   Other (Comment)    ASSESSMENT:   Michael [name] is a 64 year old male, PMH of alcohol dependence, tobacco abuse, type II DM/IDDM, HTN, GERD, hepatitis B, pancreatitis, bipolar disorder, prior seizure disorder, recent hospital admission 02/26/2019 when he had presented with seizure, hyperglycemia, required intubation for airway protection, extubated the next day and then left the hospital [place], presented to the [facility] ED via EMS with history of witnessed seizure at home.  Patient unable to provide any history due to altered mental status.  History obtained by discussing with EDP and bedside RN.  Family reportedly called EMS because patient had a seizure but unable to provide details regarding type of seizure or duration.  When EMS arrived there CBGs found to be in the 40s, unable to get IV access and hence given IM glucagon.  On arrival to the ED CBGs in the 200s.  He was hypothermic in 94 F rectally and unresponsive, placed on Bair hugger.  2/18- s/p MBSS- downgraded to dysphagia 1 diet with honey thick liquids  Reviewed I/O's: +720 ml x 24 hours and +9 L since  admission  Attempted to speak with pt via phone, however, no answer.   Pt with improved appetite and tolerating current diet well. Noted meal completion 100%.   Pt awaiting SNF placement for discharge.   Medications reviewed and include prednisone.   Labs reviewed: CBGS: 18-169 (inpatient orders for glycemic control are (0-9 units insulin aspart every 4 hours and 6 units insulin detemir BID).   Diet Order:   Diet Order            DIET - DYS 1 Room service appropriate? Yes; Fluid consistency: Honey Thick  Diet effective now              EDUCATION NEEDS:   No education needs have been identified at this time  Skin:  Skin Assessment: Reviewed RN Assessment  Last BM:  03/17/19  Height:   Ht Readings from Last 1 Encounters:  03/10/19 5\' 4"  (1.626 m)    Weight:   Wt Readings from Last 1 Encounters:  03/18/19 52 kg    Ideal Body Weight:  59.1 kg  BMI:  Body mass index is 19.68 kg/m.  Estimated Nutritional Needs:   Kcal:  1500-1700  Protein:  75-90 grams  Fluid:  > 1.5 L    Loistine Chance, RD, LDN, [place] Registered Dietitian II Certified Diabetes Care and Education Specialist Please refer to [facility] for RD and/or RD on-call/weekend/after hours pager

## 2019-03-18 NOTE — Care Management Important Message (Signed)
Important Message  Patient Details  Name: Michael [name] MRN: [id number] Date of Birth: 1955-10-26   Medicare Important Message Given:  Yes     Shelda Altes 03/18/2019, 9:25 AM

## 2019-03-18 NOTE — Progress Notes (Signed)
Pt. CBG 18 at 0400. D50 given. Pt. Alert and responsive. Requesting juice and snack. On call for [facility] paged to make aware. CBG now 90.

## 2019-03-18 NOTE — TOC Progression Note (Signed)
Transition of Care [facility]) - Progression Note    Patient Details  Name: Michael [name] MRN: [id number] Date of Birth: [birth year]  Transition of Care [facility]) CM/SW [place], [place] Phone Number: 03/18/2019, 2:13 PM  Clinical Narrative:     Patient continues to have no SNF bed offers at this time.   Expected Discharge Plan: [place] Barriers to Discharge: Continued Medical Work up  Expected Discharge Plan and Services Expected Discharge Plan: [place] Choice: [place] arrangements for the past 2 months: Single Family Home                                       Social Determinants of Health (SDOH) Interventions    Readmission Risk Interventions No flowsheet data found.

## 2019-03-18 NOTE — Progress Notes (Signed)
PROGRESS NOTE  Michael [name] [id number] DOB: 1955/09/21 DOA: 03/10/2019 PCP: Helane Rima, MD  Brief History   64 year old with history of alcohol abuse, tobacco use, DM2, HTN, GERD, hepatitis B, bipolar disorder, pancreatitis, recent seizures requiring intubation about 2 weeks ago ended up leaving [place] presents to the hospital after having witnessed seizure at home.  He was noted to be hypoglycemic, hypothermic, elevated lactate.  CT head was negative.  Neurology team was consulted.  Neurology feels that the patient's recurrent seizures are due to his hypoglycemia.  EEG did not reveal epilleptiform abnormalities and they do not recommend epileptic therapy or further evaluation unless he has an unprovoked seizures in the futures.   The patient has requested that his pain medication be restarted. This has been done. No new complaints. Pain is chronic.  The patient has been evaluated by PT/OT. They have recommended SNF placement and family is in agreement. TOC consulted for assistance.  Consultants  . Neurology  Procedures  . EEG - No epileptiform abnormality. No epileptic therapy.   Antibiotics   Anti-infectives (From admission, onward)   Start     Dose/Rate Route Frequency Ordered Stop   03/15/19 1415  amoxicillin-clavulanate (AUGMENTIN) 875-125 MG per tablet 1 tablet     1 tablet Oral Every 12 hours 03/15/19 1409     03/10/19 2200  piperacillin-tazobactam (ZOSYN) IVPB 3.375 g  Status:  Discontinued     3.375 g 12.5 mL/hr over 240 Minutes Intravenous Every 8 hours 03/10/19 1858 03/15/19 1409   03/10/19 1430  piperacillin-tazobactam (ZOSYN) IVPB 3.375 g     3.375 g 100 mL/hr over 30 Minutes Intravenous  Once 03/10/19 1416 03/10/19 1740     Subjective  The patient is resting quietly. No new complaints.  Objective   Vitals:  Vitals:   03/18/19 0809 03/18/19 1252  BP:  124/60  Pulse:  94  Resp:    Temp:  98.2 F (36.8 C)  SpO2: 100% 100%    Exam:  Constitutional:  . The patient is awake, alert, and oriented x 3. No acute distress. Respiratory:  . No increased work of breathing. . No wheezes, rales, or rhonchi . No tactile fremitus Cardiovascular:  . Regular rate and rhythm . No murmurs, ectopy, or gallups. . No lateral PMI. No thrills. Abdomen:  . Abdomen is soft, non-tender, non-distended . No hernias, masses, or organomegaly . Normoactive bowel sounds.  Musculoskeletal:  . No cyanosis, clubbing, or edema Skin:  . No rashes, lesions, ulcers . palpation of skin: no induration or nodules Neurologic:  . CN 2-12 intact . Sensation all 4 extremities intact Psychiatric:  . Mental status: The patient is awake and alert.   I have personally reviewed the following:   Today's Data  . Vitals, glucoses  Micro Data  . Blood cultures: no growth x 2 days. . Urine cultures: no growth   Imaging  . CXR Right lower lobe opacity .  Scheduled Meds: . amoxicillin-clavulanate  1 tablet Oral Q12H  . budesonide (PULMICORT) nebulizer solution  0.5 mg Nebulization BID  . enoxaparin (LOVENOX) injection  40 mg Subcutaneous Q24H  . feeding supplement (ENSURE ENLIVE)  237 mL Oral BID BM  . folic acid  1 mg Oral Daily  . insulin aspart  0-9 Units Subcutaneous Q4H  . [START ON 03/19/2019] insulin detemir  6 Units Subcutaneous Daily  . multivitamin with minerals  1 tablet Oral Daily  . pantoprazole  40 mg Oral Daily  . [START ON  03/25/2019] predniSONE  10 mg Oral Q breakfast  . [START ON 03/22/2019] predniSONE  20 mg Oral Q breakfast  . [START ON 03/19/2019] predniSONE  40 mg Oral Q breakfast  . [START ON 03/29/2019] predniSONE  5 mg Oral Q breakfast  . QUEtiapine  25 mg Oral QHS  . sodium chloride flush  3 mL Intravenous Q12H  . thiamine  100 mg Oral Daily   Or  . thiamine  100 mg Intravenous Daily   Continuous Infusions: . sodium chloride 1,000 mL (03/14/19 [id number])    Principal Problem:   Acute metabolic  encephalopathy Active Problems:   Type 2 diabetes mellitus with peripheral neuropathy (HCC)   Chronic alcoholic pancreatitis (HCC)   Protein-calorie malnutrition, severe   Tobacco abuse   Hypoglycemia   Alcohol use disorder, severe, dependence ([place])   Seizure ([place])   Hypothermia   Sepsis ([place])   LOS: 8 days   A & P  Acute respiratory distress with coughing due to acute COPD exacerbation: Resolved. The patient is receiving aggressive bronchodilators and solu-medrol.  Incentive spirometer and flutter valve are being utilized. He is COVID - 19 negative. The patient is saturating at 100% on room air at this time.  Aspiration pneumonia: Resulting in COPD exacerbation. Persistent Right lower lobe opacity due to aspiration pneumonia. The patient is on IV Zosyn and aspiration precautions. Procalcitonin positive at 1.85. He has completed 5 days of IV antibiotics. He has been transitioned to PO augmentin.  Sepsis: Lactic acidosis at Lactic Acid at 3.3, hypotension, and positive procalcitonin. Blood cultures x 2 have had no growth. Pt is receiving oral augmentin after being transitioned from IV Zosyn. Lactic acid is slowly improving with repeated boluses.   Mild Volume Overload: BMP is somewhat elevated at 229.5. Doubt significant overload. Lasix stopped. Echocardiogram performed on 03/13/2019 demonstrated EF of 60-65% and grad 1 diastolic dysfunction. No wall motion abnormalities and normal right ventricular function.   Severe recurrent seizures: Due to hypoglycemia per neurology. Pt also is dependent upon alcohol. Neurology does not see epilleptiform abnormalities on EEG. They do not recommend continuation of epilepsy medication. They also do not recommend further evaluation unless the patient presents with unprovoked seizures. Keppra has been discontinued.  Postictal period: Resolved. Due to recurrent seizures as above. Encephalopathy related to post-ictal period is waning.   Alcohol dependence and  abuse: CIWA protocol for withdrawal.   Euthyroid Sick Syndrome: Low TSH. Normal FT4 with mildly low FT3.   Diabetes mellitus type 2, poorly controlled due to hyperglycemia/hypoglycemia: Glucoses to be managed with FSBS and SSI Levemir has been reduced to 5 units daily from bid due to hypoglycemia. Oral glypizide has been held. Glucoses for the last 24 hours have run between 18 and 267.   Hypothermia: Resolved. Due to hypoglycemia/sepsis. Monitor. 36.4 this am.  Prolonged QTC: Admission QTC 646.  Improved on repeat EKG after supplementing electrolytes.  Concern for hepatic encephalopathy: Due to patient's ETOH history and mildly elevated ammonium level. Will stop lactulose as level of alertness is improved.  Hypokalemia/hypomagnesemia: Supplement and Monitor.  Daily tobacco use: Nicotine patch  Essential hypertension: Blood pressures actually low due to sepsis. Antihypertensives held. Monitor.  GERD: Continue PPI as oral.  I have seen and examined this patient myself. I have spent 30 minutes in his evaluation and care.  DVT prophylaxis: Lovenox Code Status: Full Family Communication: None Disposition Plan: Patient is from home. He lives alone.  Recommendation from PT/OT is for SNF. Discharge to home is not  a safe discharge plan.  Family is in agreement.   Jaymien Landin, DO [facility] Direct contact: see [email or web address]  7PM-7AM contact night coverage as above 03/18/2019, 1:55 PM  LOS: 2 days

## 2019-03-19 DIAGNOSIS — A419 Sepsis, unspecified organism: Principal | ICD-10-CM

## 2019-03-19 DIAGNOSIS — G9341 Metabolic encephalopathy: Secondary | ICD-10-CM

## 2019-03-19 DIAGNOSIS — E43 Unspecified severe protein-calorie malnutrition: Secondary | ICD-10-CM

## 2019-03-19 DIAGNOSIS — F1020 Alcohol dependence, uncomplicated: Secondary | ICD-10-CM

## 2019-03-19 DIAGNOSIS — E876 Hypokalemia: Secondary | ICD-10-CM

## 2019-03-19 DIAGNOSIS — T68XXXA Hypothermia, initial encounter: Secondary | ICD-10-CM

## 2019-03-19 DIAGNOSIS — E162 Hypoglycemia, unspecified: Secondary | ICD-10-CM

## 2019-03-19 DIAGNOSIS — K860 Alcohol-induced chronic pancreatitis: Secondary | ICD-10-CM

## 2019-03-19 DIAGNOSIS — T68XXXD Hypothermia, subsequent encounter: Secondary | ICD-10-CM

## 2019-03-19 LAB — GLUCOSE, CAPILLARY
Glucose-Capillary: 138 mg/dL — ABNORMAL HIGH (ref 70–99)
Glucose-Capillary: 250 mg/dL — ABNORMAL HIGH (ref 70–99)
Glucose-Capillary: 74 mg/dL (ref 70–99)
Glucose-Capillary: 85 mg/dL (ref 70–99)

## 2019-03-19 MED ORDER — INFLUENZA VAC SPLIT QUAD 0.5 ML IM SUSY
0.5000 mL | PREFILLED_SYRINGE | INTRAMUSCULAR | Status: AC
  Administered 2019-03-19: 0.5 mL via INTRAMUSCULAR
  Filled 2019-03-19: qty 0.5

## 2019-03-19 MED ORDER — AMOXICILLIN-POT CLAVULANATE 875-125 MG PO TABS: 1.0000 | ORAL_TABLET | Freq: Two times a day (BID) | ORAL | Status: DC

## 2019-03-19 MED ORDER — PREDNISONE 10 MG PO TABS: ORAL_TABLET | ORAL | 0 refills | Status: DC

## 2019-03-19 MED ORDER — AMOXICILLIN-POT CLAVULANATE 875-125 MG PO TABS: 1.0000 | ORAL_TABLET | Freq: Two times a day (BID) | ORAL | 0 refills | Status: DC

## 2019-03-19 NOTE — Progress Notes (Addendum)
[facility] PROGRESS NOTE    Progress Note  Michael [name]  [id number] DOB: 1955/09/27 DOA: 03/10/2019 PCP: Helane Rima, MD     Brief Narrative:   Michael [name] is an 64 y.o. male past medical history of tobacco abuse, diabetes mellitus type 2 essential hypertension hepatitis B, bipolar disorder pancreatitis with recent seizures requiring intubation about 2 weeks ago ended up leaving [facility] presents to the hospital after hospitalized seizure.  He was noted to be hypoglycemic, hypothermic with an elevated lactic acid CT of the head was negative.  Neurology was consulted who feels that the seizures were likely due to hypoglycemia EEG was done that showed no acute epileptic focus.  Assessment/Plan:   Acute respiratory failure with hypoxia due to COPD exacerbation: Now resolved receiving inhalers and steroid taper. Is currently satting greater 96% on room air.  He is also currently on empiric antibiotics.  Tapering down steroids.  Aspiration pneumonia: Resulting in COPD, he is currently on oral Augmentin to complete a 5-day course.  Sepsis syndrome: With positive Procalcitonin, blood culture showed no growth he will complete his course of empiric antibiotics blood pressure is stable lactic acidosis has resolved.  Mild volume overload due to acute diastolic dysfunction: 2D echo showed no wall motion abnormality Lasix has been stopped.  Severe recurrent seizures: Neurology was consulted likely due to hypoglycemia.  Hall dependence: Monitoring with Ativan protocol.  Sick euthyroid syndrome: Check TSH and free T4 as an outpatient.  Diabetes mellitus type 2: Poorly controlled his Levemir has been reduced continue sliding scale holding oral hypoglycemic agents.  Hypothermia: Likely due to hypoglycemia/sepsis now resolved.  Prolonged QTC:  Hepatic encephalopathy: Likely due to alcohol abuse elevated ammonia level was given several days of lactulose  now has been stopped.   Hypokalemia/hypomagnesemia: Supplemented orally now resolved.  Essential hypertension: Fairly stable continue to hold antihypertensive medication.  Severe protein caloric malnutrition:  RN Pressure Injury Documentation:    Estimated body mass index is 22.31 kg/m as calculated from the following:   Height as of this encounter: 5\' 4"  (1.626 m).   Weight as of this encounter: 59 kg. Malnutrition Type:  Nutrition Problem: Moderate Malnutrition Etiology: chronic illness(pancreatitis)   Malnutrition Characteristics:  Signs/Symptoms: moderate fat depletion, moderate muscle depletion, mild muscle depletion, mild fat depletion, percent weight loss Percent weight loss: 22 %   Nutrition Interventions:  Interventions: Ensure Enlive (each supplement provides 350kcal and 20 grams of protein), MVI    DVT prophylaxis: lovenox Family Communication:none Disposition Plan/Barrier to D/C: Awaiting skilled nursing facility.  Code Status:     Code Status Orders  (From admission, onward)         Start     Ordered   03/10/19 1811  Full code  Continuous     03/10/19 1816        Code Status History    Date Active Date Inactive Code Status Order ID Comments User Context   02/26/2019 [id number] 02/27/2019 1714 Full Code [id number]  Erick Colace, NP ED   02/26/2019 1543 02/26/2019 1548 Full Code [id number]  Erick Colace, NP ED   12/29/2017 0329 12/29/2017 1831 Full Code [id number]  Fatima Blank, MD ED   03/26/2017 1518 03/30/2017 1924 Full Code [id number]  Phillips Grout, MD ED   09/06/2016 0338 09/06/2016 1449 Full Code [id number]  Muthersbaugh, Gwenlyn Perking ED   06/18/2016 1946 06/22/2016 1849 Full Code [id number]  Norman Herrlich, MD ED   06/09/2016 [phone] 06/13/2016 2235  Full Code [id number]  Juliet Rude, MD Inpatient   06/05/2016 1207 06/08/2016 1812 Full Code [id number]  Collier Salina, MD Inpatient   06/05/2016 0720 06/05/2016 1136 Full Code [id number]  Riccardo Dubin, MD ED   05/31/2016 1617 06/05/2016 0501 Full Code [id number]  Kerrie Buffalo, NP Inpatient   05/31/2016 0547 05/31/2016 1614 Full Code [id number]  Muthersbaugh, Gwenlyn Perking ED   09/02/2015 2149 09/03/2015 2112 Full Code [id number]  Roney Jaffe, MD Inpatient   12/03/2014 2243 12/10/2014 1526 Full Code [id number]  Merton Border, MD Inpatient   11/17/2014 1713 11/19/2014 1613 Full Code [id number]  Jones Bales, MD Inpatient   10/09/2014 0204 10/15/2014 1851 Full Code [id number]  Vickii Chafe, MD Inpatient   09/08/2014 1903 09/09/2014 1716 Full Code [id number]  Lucious Groves, DO Inpatient   [facility] Care Planning Activity        IV Access:    Peripheral IV   Procedures and diagnostic studies:   No results found.   Medical Consultants:    None.  Anti-Infectives:   Augmentin  Subjective:    Michael [name] no complaints today.  Objective:    Vitals:   03/19/19 0643 03/19/19 0700 03/19/19 0728 03/19/19 0812  BP:    114/84  Pulse:    91  Resp:      Temp:      TempSrc:      SpO2:   98% 100%  Weight: 64.9 kg 59 kg    Height:       SpO2: 100 %   Intake/Output Summary (Last 24 hours) at 03/19/2019 0850 Last data filed at 03/19/2019 0842 Gross per 24 hour  Intake 1523 ml  Output --  Net 1523 ml   Filed Weights   03/18/19 0428 03/19/19 0643 03/19/19 0700  Weight: 52 kg 64.9 kg 59 kg    Exam: General exam: In no acute distress, has a hoarse voice. Respiratory system: Good air movement and clear to auscultation. Cardiovascular system: S1 & S2 heard, RRR. No JVD. Gastrointestinal system: Abdomen is nondistended, soft and nontender.  Central nervous system: Alert and oriented. No focal neurological deficits. Extremities: No pedal edema. Skin: No rashes, lesions or ulcers  Data Reviewed:    Labs: Basic Metabolic Panel: Recent Labs  Lab 03/13/19 0744 03/13/19 0744 03/14/19 0525 03/14/19 0525 03/15/19 0456 03/15/19 0456 03/15/19 1831  03/16/19 0415  NA 141  --  140  --  143  --  144 142  K 3.8   < > 3.7   < > 3.2*   < > 3.6 3.8  CL 109  --  108  --  114*  --  113* 112*  CO2 25  --  25  --  20*  --  22 21*  GLUCOSE 95  --  265*  --  107*  --  101* 277*  BUN 9  --  10  --  9  --  7* 7*  CREATININE 0.48*  --  0.55*  --  0.65  --  0.55* 0.61  CALCIUM 7.8*  --  7.6*  --  7.6*  --  8.0* 7.7*  MG 1.8  --  1.6*  --  1.8  --   --  1.7   < > = values in this interval not displayed.   GFR Estimated Creatinine Clearance: 80.9 mL/min (by C-G formula based on SCr of 0.61 mg/dL). Liver Function Tests: Recent Labs  Lab 03/13/19 0744 03/14/19 0525  AST  26 21  ALT 31 27  ALKPHOS 231* 214*  BILITOT 0.6 0.6  PROT 5.0* 4.5*  ALBUMIN 2.0* 1.7*   No results for input(s): LIPASE, AMYLASE in the last 168 hours. No results for input(s): AMMONIA in the last 168 hours. Coagulation profile No results for input(s): INR, PROTIME in the last 168 hours. COVID-19 Labs  No results for input(s): DDIMER, FERRITIN, LDH, CRP in the last 72 hours.  Lab Results  Component Value Date   SARSCOV2NAA NEGATIVE 03/10/2019   [place] NEGATIVE 02/26/2019    CBC: Recent Labs  Lab 03/13/19 0744 03/14/19 0525 03/15/19 0456 03/16/19 0415  WBC 10.7* 8.2 9.6 9.9  HGB 13.3 11.6* 11.7* 10.7*  HCT 38.7* 33.8* 34.6* 31.2*  MCV 95.3 95.2 98.9 96.9  PLT 162 165 161 161   Cardiac Enzymes: No results for input(s): CKTOTAL, CKMB, CKMBINDEX, TROPONINI in the last 168 hours. BNP (last 3 results) No results for input(s): PROBNP in the last 8760 hours. CBG: Recent Labs  Lab 03/18/19 1629 03/18/19 2011 03/19/19 0025 03/19/19 0444 03/19/19 0741  GLUCAP 195* 195* 74 138* 85   D-Dimer: No results for input(s): DDIMER in the last 72 hours. Hgb A1c: No results for input(s): HGBA1C in the last 72 hours. Lipid Profile: No results for input(s): CHOL, HDL, LDLCALC, TRIG, CHOLHDL, LDLDIRECT in the last 72 hours. Thyroid function studies: No  results for input(s): TSH, T4TOTAL, T3FREE, THYROIDAB in the last 72 hours.  Invalid input(s): FREET3 Anemia work up: No results for input(s): VITAMINB12, FOLATE, FERRITIN, TIBC, IRON, RETICCTPCT in the last 72 hours. Sepsis Labs: Recent Labs  Lab 03/12/19 1551 03/13/19 0744 03/13/19 1523 03/13/19 1808 03/14/19 0525 03/15/19 0456 03/16/19 0415  WBC  --  10.7*  --   --  8.2 9.6 9.9  LATICACIDVEN 3.3*  --  2.2* 2.6*  --  2.8*  --    Microbiology Recent Results (from the past 240 hour(s))  Culture, blood (routine x 2)     Status: None   Collection Time: 03/10/19  2:20 PM   Specimen: BLOOD LEFT HAND  Result Value Ref Range Status   Specimen Description BLOOD LEFT HAND  Final   Special Requests   Final    BOTTLES DRAWN AEROBIC ONLY Blood Culture results may not be optimal due to an inadequate volume of blood received in culture bottles Performed at [facility], [address], [place], [place] [address]    Culture NO GROWTH 5 DAYS  Final   Report Status 03/15/2019 FINAL  Final  Culture, blood (routine x 2)     Status: None   Collection Time: 03/10/19  4:50 PM   Specimen: BLOOD  Result Value Ref Range Status   Specimen Description BLOOD LEFT ARM  Final   Special Requests   Final    BOTTLES DRAWN AEROBIC AND ANAEROBIC Blood Culture adequate volume   Culture NO GROWTH 5 DAYS  Final   Report Status 03/15/2019 FINAL  Final  SARS CORONAVIRUS 2 (TAT 6-24 HRS) Nasopharyngeal Nasopharyngeal Swab     Status: None   Collection Time: 03/10/19  5:14 PM   Specimen: Nasopharyngeal Swab  Result Value Ref Range Status   SARS Coronavirus 2 NEGATIVE NEGATIVE Final    Comment: (NOTE) SARS-CoV-2 target nucleic acids are NOT DETECTED. The SARS-CoV-2 RNA is generally detectable in upper and lower respiratory specimens during the acute phase of infection. Negative results do not preclude SARS-CoV-2 infection, do not rule out co-infections with other pathogens, and should  not be used as  the sole basis for treatment or other patient management decisions. Negative results must be combined with clinical observations, patient history, and epidemiological information. The expected result is Negative. Fact Sheet for Patients: SugarRoll.be Fact Sheet for Healthcare Providers: https://www.woods-mathews.com/ This test is not yet approved or cleared by the [place] FDA and  has been authorized for detection and/or diagnosis of SARS-CoV-2 by FDA under an Emergency Use Authorization (EUA). This EUA will remain  in effect (meaning this test can be used) for the duration of the COVID-19 declaration under Section 56 4(b)(1) of the Act, 21 U.S.C. section 360bbb-3(b)(1), unless the authorization is terminated or revoked sooner. Performed at [facility], [place] [address], [place], [place] [address]   Urine culture     Status: None   Collection Time: 03/12/19  7:49 AM   Specimen: In/Out Cath Urine  Result Value Ref Range Status   Specimen Description IN/OUT CATH URINE  Final   Special Requests NONE  Final   Culture   Final    NO GROWTH Performed at [facility], [place] [address] [place], [place] [address]    Report Status 03/13/2019 FINAL  Final     Medications:   . amoxicillin-clavulanate  1 tablet Oral Q12H  . budesonide (PULMICORT) nebulizer solution  0.5 mg Nebulization BID  . enoxaparin (LOVENOX) injection  40 mg Subcutaneous Q24H  . feeding supplement (ENSURE ENLIVE)  237 mL Oral BID BM  . folic acid  1 mg Oral Daily  . [START ON 03/20/2019] influenza vac split quadrivalent PF  0.5 mL Intramuscular Tomorrow-1000  . insulin aspart  0-9 Units Subcutaneous Q4H  . insulin detemir  6 Units Subcutaneous Daily  . multivitamin with minerals  1 tablet Oral Daily  . pantoprazole  40 mg Oral Daily  . [START ON 03/25/2019] predniSONE  10 mg Oral Q breakfast  . [START ON 03/22/2019] predniSONE  20 mg Oral Q breakfast  .  predniSONE  40 mg Oral Q breakfast  . [START ON 03/29/2019] predniSONE  5 mg Oral Q breakfast  . QUEtiapine  25 mg Oral QHS  . sodium chloride flush  3 mL Intravenous Q12H  . thiamine  100 mg Oral Daily   Or  . thiamine  100 mg Intravenous Daily   Continuous Infusions: . sodium chloride 1,000 mL (03/14/19 0608)      LOS: 9 days   [name]  [facility]  03/19/2019, 8:50 AM

## 2019-03-19 NOTE — TOC Progression Note (Addendum)
Transition of Care [facility]) - Progression Note    Patient Details  Name: Michael [name] MRN: [id number] Date of Birth: 1956-01-10  Transition of Care [facility]) CM/SW [place], [place] Phone Number: 03/19/2019, 12:32 PM  Clinical Narrative:     Update: Patient is set up with [facility] for PT and OT home services.   CSW notes home health recommended now by PT, CSW has reached out ot [facility] as they are in network with patient's [employer], they are reviewing chart to see if they can accept for home health PT and OT. Pending response at this time.   CW reached out to Zack with [facility] for patient's requested rollator, will deliver to bedside prior to dc today.   CSW has attempted to call both of patient's sisters in chart for dc, no answer, LVM. Patient's uncle's number is not in service. Will continue to attempt to contact family to identify if fmaiy will be picking up, and if not to ensure family at home to accept patient at dc.    Expected Discharge Plan: [place]    Expected Discharge Plan and Services Expected Discharge Plan: [place] Choice: [place] arrangements for the past 2 months: Single Family Home Expected Discharge Date: 03/19/19               DME Arranged: Gilford Rile rolling with seat DME Agency: [facility] Date DME Agency Contacted: 03/19/19 Time DME Agency Contacted: [phone] Representative spoke with at DME Agency: [place]: PT, OT [place] Agency: [place] ([place]) Date [place]: 03/19/19 Time [place]: 1200 Representative spoke with at [place]: [place] ([place]) Interventions    Readmission Risk Interventions No flowsheet data found.

## 2019-03-19 NOTE — Discharge Summary (Signed)
Physician Discharge Summary  Michael [name] WGY:[id number] DOB: 06-Mar-1955 DOA: 03/10/2019  PCP: Helane Rima, MD  Admit date: 03/10/2019 Discharge date: 03/19/2019  Admitted From: Home Disposition:  Home  Recommendations for Outpatient Follow-up:  1. Follow up with PCP in 1-2 weeks titrate insulin as needed, his A1c was 14 2. Please obtain BMP/CBC in one week  Home Health:Yes Equipment/Devices:None  Discharge Condition:Stable CODE STATUS:Full Diet recommendation: Heart Healthy  Brief/Interim Summary: 64 y.o. male past medical history of tobacco abuse, diabetes mellitus type 2 essential hypertension hepatitis B, bipolar disorder pancreatitis with recent seizures requiring intubation about 2 weeks ago ended up leaving [place] presents to the hospital after hospitalized seizure.  He was noted to be hypoglycemic, hypothermic with an elevated lactic acid CT of the head was negative.  Neurology was consulted who feels that the seizures were likely due to hypoglycemia EEG was done that showed no acute epileptic focus  Discharge Diagnoses:  Principal Problem:   Acute metabolic encephalopathy Active Problems:   Type 2 diabetes mellitus with peripheral neuropathy (HCC)   Chronic alcoholic pancreatitis ([place])   Protein-calorie malnutrition, severe   Tobacco abuse   Hypoglycemia   Alcohol use disorder, severe, dependence (HCC)   Seizure ([place])   Hypothermia   Sepsis ([place])  Acute respiratory failure with hypoxia due to COPD exacerbation: He was started on IV steroids IV antibiotics and he will complete his course as an outpatient, he will go home on a steroid taper.  Aspiration pneumonia: Resulting in COPD, he was started empirically on antibiotics he will finish Augmentin as an outpatient for 5-day course.  Sepsis syndrome: With positive procalcitonin, blood cultures show no growth till date empiric antibiotics were started and sepsis physiology resolved.  Mild volume  overload due to acute diastolic heart failure: 2D echo showed no wall motion abnormalities preserved EF, he was started on IV Lasix.  He became euvolemic he will go home off Lasix.  His severe recurrent seizures: Neurology was consulted they thought this was likely due to hypoglycemia.  Alcohol dependence: There were no signs of withdrawals he was monitored with Ativan protocol.  Sick euthyroid syndrome: Repeat TSH and free T4 as an outpatient in 6 weeks.  Diabetes mellitus type 2: Poorly controlled no changes made to his medication.  He is definitely not taking his regimen at home as hemoglobin A1c was 14.  We will need to follow-up with PCP as an outpatient and titrate medications as tolerated.  Prolonged QTC  Hepatic encephalopathy: Likely due to alcohol abuse his ammonia level was elevated he was given several doses of lactulose now has been stopped.  Electrolyte hypokalemia/hypomagnesemia: There was supplemented now resolved.  Essential hypertension: Antihypertensive medications were held on admission that will be resumed as an outpatient.  Severe protein caloric malnutrition  Discharge Instructions  Discharge Instructions    Diet - low sodium heart healthy   Complete by: As directed    Increase activity slowly   Complete by: As directed      Allergies as of 03/19/2019      Reactions   Ace Inhibitors Swelling, Other (See Comments)   Angioedema - unquestionable   Losartan Swelling, Other (See Comments)   Pt presented with unquestionable angioedema on ACEIs (Angiotensin-converting enzyme (ACE) inhibitors) so aviod ARBs (Angiotensin receptor blockers), as well   Tylenol [acetaminophen] Other (See Comments)   "cannot have this" (contraindicated)      Medication List    TAKE these medications   Accu-Chek Aviva Plus  w/Device Kit 1 kit by Does not apply route every morning. diag code E11.42. Insulin dependent   Accu-Chek Aviva Soln 1 drop by In Vitro route as  needed. diag code E08.10 insulin dependent. Use as device directs   Accu-Chek Softclix Lancets lancets Use 3 times daily to check blood sugar. diag code E11.42. Insulin dependent   ammonium lactate 12 % cream Commonly known as: Lac-Hydrin Apply topically as needed for dry skin.   amoxicillin-clavulanate 875-125 MG tablet Commonly known as: AUGMENTIN Take 1 tablet by mouth every 12 (twelve) hours.   ARIPiprazole 2 MG tablet Commonly known as: ABILIFY Take 2 mg by mouth daily.   aspirin EC 81 MG tablet Take by mouth.   B-D SINGLE USE SWABS REGULAR Pads Patient uses 6 times a day. Patient tests 3 times per day and administers insulin 3 times a day. ICD 10 code E11.42   Belbuca 900 MCG Film Generic drug: Buprenorphine HCl Place 1 Film inside cheek every 12 (twelve) hours.   diclofenac sodium 1 % Gel Commonly known as: VOLTAREN Apply 2 g topically 4 (four) times daily -  before meals and at bedtime.   DULoxetine 30 MG capsule Commonly known as: CYMBALTA Take 30 mg by mouth daily.   feeding supplement (GLUCERNA SHAKE) Liqd Take 237 mLs by mouth 3 (three) times daily between meals.   folic acid 1 MG tablet Commonly known as: FOLVITE Take 1 tablet (1 mg total) by mouth daily.   gabapentin 300 MG capsule Commonly known as: NEURONTIN TAKE 3 CAPSULES BY MOUTH  EVERY MORNING AND  AFTERNOON. TAKE 4 CAPSULES  BY MOUTH AT NIGHT BEFORE  BED What changed:   how much to take  how to take this  when to take this  additional instructions   glipiZIDE 10 MG tablet Commonly known as: GLUCOTROL Take 10 mg by mouth daily before breakfast.   glucose blood test strip Commonly known as: Accu-Chek Aviva Plus Use 3 times daily to check blood sugar. diag code E11.42. Insulin dependent   hydrOXYzine 25 MG tablet Commonly known as: ATARAX/VISTARIL Take 25 mg by mouth 2 (two) times daily as needed for anxiety.   ibuprofen 800 MG tablet Commonly known as: ADVIL Take 800 mg by mouth  3 (three) times daily as needed for mild pain.   insulin aspart 100 UNIT/ML FlexPen Commonly known as: NOVOLOG Take 5 units before breakfast,   before lunch, and 5 units before dinner (5 units before each meal) What changed:   how much to take  how to take this  when to take this  additional instructions   Levemir FlexTouch 100 UNIT/ML Pen Generic drug: Insulin Detemir 4 units Twice a day What changed:   how much to take  how to take this  when to take this  additional instructions   lipase/protease/amylase 36000 UNITS Cpep capsule Commonly known as: Creon TAKE 1 CAPSULE BY MOUTH 3  TIMES DAILY BEFORE MEALS   multivitamin with minerals Tabs tablet Take 1 tablet by mouth daily.   Narcan 4 MG/0.1ML Liqd nasal spray kit Generic drug: naloxone as needed.   omega-3 acid ethyl esters 1 g capsule Commonly known as: LOVAZA Take 1 capsule (1 g total) by mouth daily.   oxyCODONE-acetaminophen 10-325 MG tablet Commonly known as: PERCOCET Take 1 tablet by mouth every 8 (eight) hours as needed for pain.   pantoprazole 40 MG tablet Commonly known as: PROTONIX Take 1 tablet (40 mg total) by mouth daily.   pravastatin 10  MG tablet Commonly known as: PRAVACHOL Take 1 tablet (10 mg total) by mouth daily at 6 PM.   predniSONE 10 MG tablet Commonly known as: DELTASONE 4 tablets for 1 days, then 3 tablets for 1 days, then 2 tabs for 1 days, then 1 tab for 1 days, and then stop.   QUEtiapine Fumarate 150 MG 24 hr tablet Commonly known as: SEROQUEL XR Take 150 mg by mouth at bedtime.   thiamine 100 MG tablet Commonly known as: VITAMIN B-1 Take 1 tablet (100 mg total) by mouth daily.       Allergies  Allergen Reactions  . Ace Inhibitors Swelling and Other (See Comments)    Angioedema - unquestionable  . Losartan Swelling and Other (See Comments)    Pt presented with unquestionable angioedema on ACEIs (Angiotensin-converting enzyme (ACE) inhibitors) so aviod ARBs  (Angiotensin receptor blockers), as well  . Tylenol [Acetaminophen] Other (See Comments)    "cannot have this" (contraindicated)    Consultations:  Neurology   Procedures/Studies: EEG  Result Date: 02/26/2019 Lora Havens, MD     02/26/2019  3:09 PM Patient Name: Michael [name] MRN: [id number] Epilepsy Attending: Lora Havens Referring Physician/Provider: Dr Kerney Elbe Date: 02/26/2019 Duration: 29.41 mins Patient history: 64 year old male with history of hypertension who presented with 2 witnessed generalized tonic-clonic seizures in the setting of blood glucose more than 700.  On examination, patient is obtunded and not responding to noxious stimuli.  EEG to evaluate for subclinical seizures/status epilepticus. Level of alertness: comatose AEDs during EEG study: Keppra, versed Technical aspects: This EEG study was done with scalp electrodes positioned according to the 10-20 International system of electrode placement. Electrical activity was acquired at a sampling rate of 500Hz  and reviewed with a high frequency filter of 70Hz  and a low frequency filter of 1Hz . EEG data were recorded continuously and digitally stored. Description: EEG showed continuous generalized low amplitude 3 to 7 Hz theta-delta slowing admixed with 15 to 18 Hz generalized beta activity.  EEG was reactive to noxious stimulation. Hyperventilation and photic stimulation were not performed due to AMS. Abnormality -Continuous slow, generalized IMPRESSION: This study is suggestive of severe diffuse encephalopathy, nonspecific etiology but likely secondary to sedation and postictal state. No seizures or epileptiform discharges were seen throughout the recording. Dr. Kerney Elbe was notified. Lora Havens   CT Code Stroke CTA Head W/WO contrast  Result Date: 02/26/2019 CLINICAL DATA:  64 year old male code stroke presentation. Last seen normal 1300 hours. EXAM: CT ANGIOGRAPHY HEAD AND NECK TECHNIQUE: Multidetector CT imaging  of the head and neck was performed using the standard protocol during bolus administration of intravenous contrast. Multiplanar CT image reconstructions and MIPs were obtained to evaluate the vascular anatomy. Carotid stenosis measurements (when applicable) are obtained utilizing NASCET criteria, using the distal internal carotid diameter as the denominator. CONTRAST:  75 milliliters Omnipaque 350. COMPARISON:  Head CT 1352 hours today. CTA head and neck 03/26/2017. FINDINGS: CTA NECK Skeleton: Absent dentition. No acute osseous abnormality identified. Upper chest: Fluid-filled thoracic esophagus but otherwise negative visible upper chest. Other neck: Mild motion artifact. There is a right nasal airway in place. No neck mass or lymphadenopathy identified. Aortic arch: 4 vessel arch configuration, the left vertebral artery arises directly from the arch. Mild arch atherosclerosis. Right carotid system: Minor brachiocephalic calcified plaque. Tortuous proximal right CCA. Chronic calcified plaque at the level of the right thyroid but no significant stenosis. Negative right carotid bifurcation. Mild calcified plaque just below the  skull base but no cervical right ICA stenosis. Left carotid system: Mild left CCA origin plaque without stenosis. Stable mild calcified plaque at the left ICA origin and bulb without stenosis. Patent left ICA to the skull base. Vertebral arteries: Normal proximal right subclavian artery and right vertebral artery origin. The right vertebral is non dominant and diminutive but remains patent to the skull base without stenosis. The dominant left vertebral artery arises directly from the arch with mild calcification at its origin but no stenosis. The left vertebral remains patent to the skull base without stenosis. CTA HEAD Posterior circulation: Dominant left vertebral artery, the right functionally terminates in PICA. Mild left V4 segment plaque without stenosis. Normal left PICA origin. Patent  basilar artery without stenosis. Fetal type PCA origins more so the left. SCA origins are patent. Bilateral PCA branches are stable and within normal limits allowing for mild motion artifact. Anterior circulation: Both ICA siphons are patent with chronic calcified plaque not resulting in significant stenosis. Normal an normal MCA and ACA origins. Posterior communicating artery origins are within normal limits. Diminutive or absent anterior communicating artery. Bilateral ACA branches are stable and within normal limits. Left MCA M1 segment bifurcates early without stenosis. Left MCA branches are stable and within normal limits. Right MCA M1 segment and bifurcation are patent without stenosis. Right MCA branches are stable and within normal limits. Venous sinuses: Patent. Dominant right transverse and sigmoid sinuses. Anatomic variants: Left vertebral artery is dominant and arises directly from the arch. Fetal type PCA origins, more so the left. Dominant right transverse and sigmoid sinuses. Review of the MIP images confirms the above findings IMPRESSION: 1. Negative for large vessel occlusion. 2. Stable arterial findings on CTA since 2019. Carotid atherosclerosis but no hemodynamically significant stenosis. 3. Fluid-filled thoracic esophagus noted but of unclear significance. Right nasal airway in place. Findings discussed by telephone and text page with Dr. Kerney Elbe on 02/26/2019 at 14:16 . Electronically Signed   By: Genevie Ann M.D.   On: 02/26/2019 14:19   CT Head Wo Contrast  Result Date: 03/10/2019 CLINICAL DATA:  Possible seizure today. EXAM: CT HEAD WITHOUT CONTRAST TECHNIQUE: Contiguous axial images were obtained from the base of the skull through the vertex without intravenous contrast. COMPARISON:  Head CT scan 02/26/2019.  Brain MRI 03/26/2017. FINDINGS: Brain: No evidence of acute infarction, hemorrhage, hydrocephalus, extra-axial collection or mass lesion/mass effect. Atrophy and chronic  microvascular ischemic change noted. Vascular: Atherosclerosis. Skull: Intact.  No focal lesion. Sinuses/Orbits: Thickening of the walls of the left maxillary sinus consistent with chronic sinus disease noted. The patient is status post ethmoidectomy. No mucosal thickening identified. Other: None. IMPRESSION: No acute abnormality. Atrophy and chronic microvascular ischemic change. Electronically Signed   By: Inge Rise M.D.   On: 03/10/2019 15:19   CT Code Stroke CTA Neck W/WO contrast  Result Date: 02/26/2019 CLINICAL DATA:  64 year old male code stroke presentation. Last seen normal 1300 hours. EXAM: CT ANGIOGRAPHY HEAD AND NECK TECHNIQUE: Multidetector CT imaging of the head and neck was performed using the standard protocol during bolus administration of intravenous contrast. Multiplanar CT image reconstructions and MIPs were obtained to evaluate the vascular anatomy. Carotid stenosis measurements (when applicable) are obtained utilizing NASCET criteria, using the distal internal carotid diameter as the denominator. CONTRAST:  75 milliliters Omnipaque 350. COMPARISON:  Head CT 1352 hours today. CTA head and neck 03/26/2017. FINDINGS: CTA NECK Skeleton: Absent dentition. No acute osseous abnormality identified. Upper chest: Fluid-filled thoracic esophagus but otherwise  negative visible upper chest. Other neck: Mild motion artifact. There is a right nasal airway in place. No neck mass or lymphadenopathy identified. Aortic arch: 4 vessel arch configuration, the left vertebral artery arises directly from the arch. Mild arch atherosclerosis. Right carotid system: Minor brachiocephalic calcified plaque. Tortuous proximal right CCA. Chronic calcified plaque at the level of the right thyroid but no significant stenosis. Negative right carotid bifurcation. Mild calcified plaque just below the skull base but no cervical right ICA stenosis. Left carotid system: Mild left CCA origin plaque without stenosis. Stable  mild calcified plaque at the left ICA origin and bulb without stenosis. Patent left ICA to the skull base. Vertebral arteries: Normal proximal right subclavian artery and right vertebral artery origin. The right vertebral is non dominant and diminutive but remains patent to the skull base without stenosis. The dominant left vertebral artery arises directly from the arch with mild calcification at its origin but no stenosis. The left vertebral remains patent to the skull base without stenosis. CTA HEAD Posterior circulation: Dominant left vertebral artery, the right functionally terminates in PICA. Mild left V4 segment plaque without stenosis. Normal left PICA origin. Patent basilar artery without stenosis. Fetal type PCA origins more so the left. SCA origins are patent. Bilateral PCA branches are stable and within normal limits allowing for mild motion artifact. Anterior circulation: Both ICA siphons are patent with chronic calcified plaque not resulting in significant stenosis. Normal an normal MCA and ACA origins. Posterior communicating artery origins are within normal limits. Diminutive or absent anterior communicating artery. Bilateral ACA branches are stable and within normal limits. Left MCA M1 segment bifurcates early without stenosis. Left MCA branches are stable and within normal limits. Right MCA M1 segment and bifurcation are patent without stenosis. Right MCA branches are stable and within normal limits. Venous sinuses: Patent. Dominant right transverse and sigmoid sinuses. Anatomic variants: Left vertebral artery is dominant and arises directly from the arch. Fetal type PCA origins, more so the left. Dominant right transverse and sigmoid sinuses. Review of the MIP images confirms the above findings IMPRESSION: 1. Negative for large vessel occlusion. 2. Stable arterial findings on CTA since 2019. Carotid atherosclerosis but no hemodynamically significant stenosis. 3. Fluid-filled thoracic esophagus  noted but of unclear significance. Right nasal airway in place. Findings discussed by telephone and text page with Dr. Kerney Elbe on 02/26/2019 at 14:16 . Electronically Signed   By: Genevie Ann M.D.   On: 02/26/2019 14:19   DG Chest Port 1 View  Result Date: 03/11/2019 CLINICAL DATA:  Dyspnea EXAM: PORTABLE CHEST 1 VIEW COMPARISON:  03/10/2019 FINDINGS: The heart size and mediastinal contours are within normal limits. Persistent right lower lobe airspace opacity, not significantly changed from the previous exam. Left lung is clear. No pleural effusion or pneumothorax. Posttraumatic deformity of the distal right clavicle, unchanged. IMPRESSION: Persistent right lower lobe airspace opacity, not significantly changed from previous exam. Electronically Signed   By: Davina Poke D.O.   On: 03/11/2019 13:46   DG Chest Port 1 View  Result Date: 03/10/2019 CLINICAL DATA:  Altered mental status, seizure, possible aspiration. EXAM: PORTABLE CHEST 1 VIEW COMPARISON:  Chest radiograph 02/27/2019 FINDINGS: Heart size within normal limits. Airspace disease within the right lung base persists, although has improved since prior examination 02/27/2019. The left lung remains clear. No evidence of pleural effusion or pneumothorax. Redemonstrated displaced distal right clavicle fracture. Thoracic spondylosis. IMPRESSION: Persistent although improved airspace disease within the right lung base as compared to  chest radiograph 02/27/2019. The left lung remains clear. Redemonstrated displaced distal right clavicle fracture. Electronically Signed   By: Kellie Simmering DO   On: 03/10/2019 14:52   Portable Chest xray  Result Date: 02/27/2019 CLINICAL DATA:  Acute renal failure EXAM: PORTABLE CHEST 1 VIEW COMPARISON:  02/26/2019 FINDINGS: Endotracheal tube and gastric catheter are noted in satisfactory position. Increasing infiltrates are noted particularly in the right upper lobe when compared with the prior exam. No sizable effusion  is seen. No bony abnormality is noted. IMPRESSION: Tubes and lines stable in appearance. Increase in infiltrate particularly within the right lung. Electronically Signed   By: Inez Catalina M.D.   On: 02/27/2019 03:41   DG Chest Port 1 View  Result Date: 02/26/2019 CLINICAL DATA:  Endotracheal tube placement EXAM: PORTABLE CHEST 1 VIEW COMPARISON:  02/02/2018 FINDINGS: Endotracheal tube is approximately 3 cm above the carina. Enteric tube terminates within the stomach. No new consolidation or edema. No pleural effusion or pneumothorax. Stable cardiomediastinal contours with normal heart size. IMPRESSION: No acute process in the chest. Electronically Signed   By: Macy Mis M.D.   On: 02/26/2019 14:38   DG Shoulder Left  Result Date: 03/16/2019 CLINICAL DATA:  64 year old male with shoulder pain after fall. EXAM: LEFT SHOULDER - 2+ VIEW COMPARISON:  10/09/2014. FINDINGS: Bone mineralization remains normal. No glenohumeral joint dislocation. Proximal left humerus, scapula and visible clavicle appear stable and intact. Negative visible left ribs and lung parenchyma. IMPRESSION: Negative. Electronically Signed   By: Genevie Ann M.D.   On: 03/16/2019 17:36   DG Swallowing Func-Speech Pathology  Result Date: 03/14/2019 Objective Swallowing Evaluation: Type of Study: MBS-Modified Barium Swallow Study  Patient Details Name: IMRAAN WENDELL MRN: [id number] Date of Birth: [birth year] Today's Date: 03/14/2019 Time: SLP Start Time (ACUTE ONLY): 0915 -SLP Stop Time (ACUTE ONLY): 0935 SLP Time Calculation (min) (ACUTE ONLY): 20 min Past Medical History: Past Medical History: Diagnosis Date . Anxiety  . Arthritis   "joints; shoulders; feet" (06/08/2016) . Bipolar disorder ([place])  . Daily headache   "7:30 - 8:00 q night" (06/08/2016) . Diabetic peripheral neuropathy ([place])  . GERD (gastroesophageal reflux disease)  . History of hepatitis B virus infection conferring immunity 03/22/2018 . Pancreatitis  . Schizophrenia ([place])  .  Seizures ([place]) 01/2016 X 2 . Type II diabetes mellitus ([place])  Past Surgical History: Past Surgical History: Procedure Laterality Date . BALLOON DILATION  03/22/2011  Procedure: BALLOON DILATION;  Surgeon: Gatha Mayer, MD;  Location: [facility];  Service: Endoscopy;  Laterality: N/A; . COLONOSCOPY N/A 05/22/2012  Procedure: COLONOSCOPY;  Surgeon: Gatha Mayer, MD;  Location: [facility];  Service: Endoscopy;  Laterality: N/A; . COLONOSCOPY W/ BIOPSIES AND POLYPECTOMY  09/12/11 . ESOPHAGOGASTRODUODENOSCOPY  03/22/2011  Procedure: ESOPHAGOGASTRODUODENOSCOPY (EGD);  Surgeon: Gatha Mayer, MD;  Location: [facility];  Service: Endoscopy;  Laterality: N/A;  egd with balloon  . EUS  04/20/2011  Procedure: UPPER ENDOSCOPIC ULTRASOUND (EUS) LINEAR;  Surgeon: Milus Banister, MD;  Location: [facility];  Service: Endoscopy;  Laterality: N/A; . KNEE ARTHROSCOPY Left  HPI: Pt is a 64 y/o male admitted secondary to acute respiratory distress due to acute COPD exacerbation and sepsis. A&P notable for aspiration PNA. He is on IV Zosyn. CT of head negative for acute abnormality. PMH includes GERD, DM, alcohol abuse, tobacco use, HTN, hepatitis B, bipolar disorder, and pancreatitis. Pt hospitalized with recent seizures requiring intubation about 2 weeks ago. Chest x-ray of 2/16: Persistent right lower lobe  airspace opacity.  No data recorded Assessment / Plan / Recommendation CHL IP CLINICAL IMPRESSIONS 03/14/2019 Clinical Impression Pt was seen in radiology suite for modified barium swallow study. He was seated upright in swallow chair for the study which was conducted in the lateral view. Trials of puree, mechanical soft solids, regular texture solids, thin liquids, nectar thick liquids and honey thick liquids were administered. He presents with moderate oropharyngeal phase dysphagia characterized by impaired bolus control, a pharyngeal delay, penetration and aspiration. He exhibited premature spillage to the valleculae and  pyriform sinuses accross consistencies, penetration with honey thick liquids (PAS 2), with nectar thick liquids (PAS 3), and aspiration (PAS 7,8) with dysphagia 2 solids, thin liquids and nectar thick liquids. Pt demonstrated impulsive tendencies during the study and was not responsive to cues to reduce bolus sizes. With bolus sizes reduced to 5cc thin liquids, penetration (PAS 3) was still noted. No functional benefit was noted with reduced bolus sizes of solids or with a chin tuck posture. Coughing was inconsistently noted following episodes of aspiration but it was unsuccessful in expelling the aspirant. A dysphagia 1 diet with honey thick liquids is recommended at this time. Pt may have ice chips following oral care. SLP will follow for dysphagia treatment.  SLP Visit Diagnosis Dysphagia, oropharyngeal phase (R13.12) Attention and concentration deficit following -- Frontal lobe and executive function deficit following -- Impact on safety and function Moderate aspiration risk   CHL IP TREATMENT RECOMMENDATION 03/14/2019 Treatment Recommendations Therapy as outlined in treatment plan below   Prognosis 03/14/2019 Prognosis for Safe Diet Advancement Fair Barriers to Reach Goals Cognitive deficits;Motivation Barriers/Prognosis Comment -- CHL IP DIET RECOMMENDATION 03/14/2019 SLP Diet Recommendations Dysphagia 1 (Puree) solids;Ice chips PRN after oral care;Honey thick liquids Liquid Administration via No straw;Cup Medication Administration Crushed with puree Compensations Minimize environmental distractions;Slow rate;Small sips/bites Postural Changes Remain semi-upright after after feeds/meals (Comment)   CHL IP OTHER RECOMMENDATIONS 03/14/2019 Recommended Consults -- Oral Care Recommendations Oral care BID Other Recommendations Order thickener from pharmacy   CHL IP FOLLOW UP RECOMMENDATIONS 03/14/2019 Follow up Recommendations 24 hour supervision/assistance;Skilled Nursing facility   [facility] IP FREQUENCY AND DURATION  03/14/2019 Speech Therapy Frequency (ACUTE ONLY) min 2x/week Treatment Duration 2 weeks      CHL IP ORAL PHASE 03/14/2019 Oral Phase Impaired Oral - Pudding Teaspoon -- Oral - Pudding Cup -- Oral - Honey Teaspoon -- Oral - Honey Cup WFL Oral - Nectar Teaspoon -- Oral - Nectar Cup Premature spillage Oral - Nectar Straw -- Oral - Thin Teaspoon -- Oral - Thin Cup Premature spillage Oral - Thin Straw -- Oral - Puree Premature spillage Oral - Mech Soft -- Oral - Regular Premature spillage Oral - Multi-Consistency -- Oral - Pill -- Oral Phase - Comment --  CHL IP PHARYNGEAL PHASE 03/14/2019 Pharyngeal Phase Impaired Pharyngeal- Pudding Teaspoon -- Pharyngeal -- Pharyngeal- Pudding Cup -- Pharyngeal -- Pharyngeal- Honey Teaspoon -- Pharyngeal -- Pharyngeal- Honey Cup Delayed swallow initiation-vallecula;Penetration/Aspiration during swallow Pharyngeal Material enters airway, remains ABOVE vocal cords then ejected out Pharyngeal- Nectar Teaspoon -- Pharyngeal -- Pharyngeal- Nectar Cup Penetration/Aspiration during swallow;Delayed swallow initiation-pyriform sinuses Pharyngeal Material enters airway, passes BELOW cords and not ejected out despite cough attempt by patient Pharyngeal- Nectar Straw -- Pharyngeal -- Pharyngeal- Thin Teaspoon -- Pharyngeal -- Pharyngeal- Thin Cup Penetration/Aspiration during swallow;Delayed swallow initiation-pyriform sinuses;Compensatory strategies attempted (with notebox) Pharyngeal Material enters airway, passes BELOW cords and not ejected out despite cough attempt by patient Pharyngeal- Thin Straw -- Pharyngeal -- Pharyngeal- Puree  Delayed swallow initiation-vallecula Pharyngeal -- Pharyngeal- Mechanical Soft Penetration/Aspiration during swallow;Delayed swallow initiation-pyriform sinuses Pharyngeal Material enters airway, passes BELOW cords without attempt by patient to eject out (silent aspiration) Pharyngeal- Regular WFL Pharyngeal -- Pharyngeal- Multi-consistency -- Pharyngeal --  Pharyngeal- Pill -- Pharyngeal -- Pharyngeal Comment --  CHL IP CERVICAL ESOPHAGEAL PHASE 03/14/2019 Cervical Esophageal Phase WFL Pudding Teaspoon -- Pudding Cup -- Honey Teaspoon -- Honey Cup -- Nectar Teaspoon -- Nectar Cup -- Nectar Straw -- Thin Teaspoon -- Thin Cup -- Thin Straw -- Puree -- Mechanical Soft -- Regular -- Multi-consistency -- Pill -- Cervical Esophageal Comment -- Shanika I. Hardin Negus, [place], [place] Office number [phone] Pager [phone] Horton Marshall 03/14/2019, 11:27 AM              EEG adult  Result Date: 03/11/2019 Lora Havens, MD     03/11/2019  4:30 PM Patient Name: Michael [name] MRN: [id number] Epilepsy Attending: Lora Havens Referring Physician/Provider: Dr Vernell Leep Date: 03/11/2019 Duration: 23.31 mins Patient history: 64 year old with history of alcohol abuse,  bipolar disorder,  recent seizures who presents to the hospital after having witnessed seizure at home. EEG to evaluate for seizure. Level of alertness: awake AEDs during EEG study: None Technical aspects: This EEG study was done with scalp electrodes positioned according to the 10-20 International system of electrode placement. Electrical activity was acquired at a sampling rate of 500Hz  and reviewed with a high frequency filter of 70Hz  and a low frequency filter of 1Hz . EEG data were recorded continuously and digitally stored. DESCRIPTION: No clear posterior dominant rhythm was seen. EEG showed continuous generalized 3-7hz  theta-delta slowing. Hyperventilation and photic stimulation were not performed. ABNORMALITY - Continuous slow, generalized IMPRESSION: This study is suggestive of moderate diffuse encephalopathy, non specific to etiology. No seizures or epileptiform discharges were seen throughout the recording. Lora Havens   ECHOCARDIOGRAM COMPLETE  Result Date: 03/13/2019    ECHOCARDIOGRAM REPORT   Patient Name:   Michael [name] Date of Exam: 03/13/2019  Medical Rec #:  [id number]      Height:       64.0 in Accession #:    [id number]     Weight:       105.6 lb Date of Birth:  Jun 01, 1955      BSA:          1.49 m Patient Age:    64 years       BP:           107/77 mmHg Patient Gender: M              HR:           75 bpm. Exam Location:  Inpatient Procedure: 2D Echo, Color Doppler and Cardiac Doppler Indications:    W10.27 Acute systolic (congestive) heart failure  History:        Patient has no prior history of Echocardiogram examinations.                 COPD; Risk Factors:Hypertension, Diabetes and Dyslipidemia.  Sonographer:    Raquel Sarna Senior RDCS Referring Phys: [phone] AVA SWAYZE  Sonographer Comments: Technically difficult study due to patient body habitus. IMPRESSIONS  1. Left ventricular ejection fraction, by estimation, is 60 to 65%. The left ventricle has normal function. The left ventricle has no regional wall motion abnormalities. Left ventricular diastolic parameters are consistent with Grade I diastolic dysfunction (impaired relaxation).  2. Right ventricular systolic function is normal. The  right ventricular size is normal. Tricuspid regurgitation signal is inadequate for assessing PA pressure.  3. The mitral valve is normal in structure and function. No evidence of mitral valve regurgitation. No evidence of mitral stenosis.  4. The aortic valve is tricuspid. Aortic valve regurgitation is not visualized. Mild aortic valve sclerosis is present, with no evidence of aortic valve stenosis.  5. The inferior vena cava is normal in size with greater than 50% respiratory variability, suggesting right atrial pressure of 3 mmHg. FINDINGS  Left Ventricle: Left ventricular ejection fraction, by estimation, is 60 to 65%. The left ventricle has normal function. The left ventricle has no regional wall motion abnormalities. The left ventricular internal cavity size was normal in size. There is  no left ventricular hypertrophy. Left ventricular diastolic parameters are  consistent with Grade I diastolic dysfunction (impaired relaxation). Right Ventricle: The right ventricular size is normal. No increase in right ventricular wall thickness. Right ventricular systolic function is normal. Tricuspid regurgitation signal is inadequate for assessing PA pressure. Left Atrium: Left atrial size was normal in size. Right Atrium: Right atrial size was normal in size. Pericardium: There is no evidence of pericardial effusion. Mitral Valve: The mitral valve is normal in structure and function. No evidence of mitral valve regurgitation. No evidence of mitral valve stenosis. Tricuspid Valve: The tricuspid valve is normal in structure. Tricuspid valve regurgitation is not demonstrated. Aortic Valve: The aortic valve is tricuspid. Aortic valve regurgitation is not visualized. Mild aortic valve sclerosis is present, with no evidence of aortic valve stenosis. Pulmonic Valve: The pulmonic valve was normal in structure. Pulmonic valve regurgitation is not visualized. Aorta: The aortic root is normal in size and structure. Venous: The inferior vena cava is normal in size with greater than 50% respiratory variability, suggesting right atrial pressure of 3 mmHg. IAS/Shunts: No atrial level shunt detected by color flow Doppler.  LEFT VENTRICLE PLAX 2D LVIDd:         4.01 cm  Diastology LVIDs:         2.56 cm  LV e' lateral:   8.92 cm/s LV PW:         0.90 cm  LV E/e' lateral: 7.2 LV IVS:        0.96 cm  LV e' medial:    6.31 cm/s LVOT diam:     2.10 cm  LV E/e' medial:  10.1 LV SV:         72.04 ml LV SV Index:   31.95 LVOT Area:     3.46 cm  RIGHT VENTRICLE RV S prime:     9.79 cm/s TAPSE (M-mode): 1.6 cm LEFT ATRIUM             Index       RIGHT ATRIUM          Index LA diam:        2.60 cm 1.74 cm/m  RA Area:     9.83 cm LA Vol (A2C):   29.6 ml 19.85 ml/m RA Volume:   19.60 ml 13.14 ml/m LA Vol (A4C):   31.3 ml 20.99 ml/m LA Biplane Vol: 32.0 ml 21.46 ml/m  AORTIC VALVE LVOT Vmax:   88.70 cm/s  LVOT Vmean:  60.400 cm/s LVOT VTI:    0.208 m  AORTA Ao Root diam: 3.20 cm MITRAL VALVE MV Area (PHT): 2.95 cm    SHUNTS MV Decel Time: 257 msec    Systemic VTI:  0.21 m MV E velocity: 64.00 cm/s  Systemic  Diam: 2.10 cm MV A velocity: 62.10 cm/s MV E/A ratio:  1.03 Loralie Champagne MD Electronically signed by Loralie Champagne MD Signature Date/Time: 03/13/2019/4:26:40 PM    Final    CT HEAD CODE STROKE WO CONTRAST  Result Date: 02/26/2019 CLINICAL DATA:  Code stroke. 64 year old male with left gaze and abnormal speech. EXAM: CT HEAD WITHOUT CONTRAST TECHNIQUE: Contiguous axial images were obtained from the base of the skull through the vertex without intravenous contrast. COMPARISON:  Brain MRI and head CT 03/26/2017. FINDINGS: Brain: Mild motion artifact. No midline shift, mass effect, or evidence of intracranial mass lesion. No ventriculomegaly. No acute intracranial hemorrhage identified. Patchy chronic white matter hypodensity in both hemispheres. No superimposed acute cortically based infarct identified. Vascular: Calcified atherosclerosis at the skull base. Questionable abnormal hyperdensity at the left ICA terminus on series 5, image 27, but occurs in an area of streak artifact. Skull: No acute osseous abnormality identified. Sinuses/Orbits: Chronic maxillary sinus periosteal thickening with new left maxillary fluid level since 2019. Other Visualized paranasal sinuses and mastoids are stable and well pneumatized. Other: No acute orbit or scalp soft tissue finding. ASPECTS [facility] Stroke Program Early CT Score) Total score (0-10 with 10 being normal): 10 IMPRESSION: 1. Questionable hyperdensity at the left ICA terminus, but no acute cortically based infarct or acute intracranial hemorrhage identified. ASPECTS is 10. 2. Stable cerebral white matter disease since 2019. 3. These results were communicated to Dr. Cheral Marker at 2:00 pm on 02/26/2019 by text page via the [facility] messaging system. Electronically Signed   By: Genevie Ann M.D.   On: 02/26/2019 14:01    2D echo showed diastolic heart failure preserved EF.  Subjective: Feels great no complaints.  Discharge Exam: Vitals:   03/19/19 0728 03/19/19 0812  BP:  114/84  Pulse:  91  Resp:    Temp:    SpO2: 98% 100%   Vitals:   03/19/19 0643 03/19/19 0700 03/19/19 0728 03/19/19 0812  BP:    114/84  Pulse:    91  Resp:      Temp:      TempSrc:      SpO2:   98% 100%  Weight: 64.9 kg 59 kg    Height:        General: Pt is alert, awake, not in acute distress Cardiovascular: RRR, S1/S2 +, no rubs, no gallops Respiratory: CTA bilaterally, no wheezing, no rhonchi Abdominal: Soft, NT, ND, bowel sounds + Extremities: no edema, no cyanosis    The results of significant diagnostics from this hospitalization (including imaging, microbiology, ancillary and laboratory) are listed below for reference.     Microbiology: Recent Results (from the past 240 hour(s))  Culture, blood (routine x 2)     Status: None   Collection Time: 03/10/19  2:20 PM   Specimen: BLOOD LEFT HAND  Result Value Ref Range Status   Specimen Description BLOOD LEFT HAND  Final   Special Requests   Final    BOTTLES DRAWN AEROBIC ONLY Blood Culture results may not be optimal due to an inadequate volume of blood received in culture bottles Performed at [facility] [address] [place], [place] [address]    Culture NO GROWTH 5 DAYS  Final   Report Status 03/15/2019 FINAL  Final  Culture, blood (routine x 2)     Status: None   Collection Time: 03/10/19  4:50 PM   Specimen: BLOOD  Result Value Ref Range Status   Specimen Description BLOOD LEFT ARM  Final   Special Requests   Final    BOTTLES DRAWN AEROBIC AND ANAEROBIC Blood Culture adequate volume   Culture NO GROWTH 5 DAYS  Final   Report Status 03/15/2019 FINAL  Final  SARS CORONAVIRUS 2 (TAT 6-24 HRS) Nasopharyngeal Nasopharyngeal Swab     Status: None   Collection Time: 03/10/19  5:14 PM   Specimen:  Nasopharyngeal Swab  Result Value Ref Range Status   SARS Coronavirus 2 NEGATIVE NEGATIVE Final    Comment: (NOTE) SARS-CoV-2 target nucleic acids are NOT DETECTED. The SARS-CoV-2 RNA is generally detectable in upper and lower respiratory specimens during the acute phase of infection. Negative results do not preclude SARS-CoV-2 infection, do not rule out co-infections with other pathogens, and should not be used as the sole basis for treatment or other patient management decisions. Negative results must be combined with clinical observations, patient history, and epidemiological information. The expected result is Negative. Fact Sheet for Patients: SugarRoll.be Fact Sheet for Healthcare Providers: https://www.woods-mathews.com/ This test is not yet approved or cleared by the [place] FDA and  has been authorized for detection and/or diagnosis of SARS-CoV-2 by FDA under an Emergency Use Authorization (EUA). This EUA will remain  in effect (meaning this test can be used) for the duration of the COVID-19 declaration under Section 56 4(b)(1) of the Act, 21 U.S.C. section 360bbb-3(b)(1), unless the authorization is terminated or revoked sooner. Performed at [facility], [place] [address], [place], [place] [address]   Urine culture     Status: None   Collection Time: 03/12/19  7:49 AM   Specimen: In/Out Cath Urine  Result Value Ref Range Status   Specimen Description IN/OUT CATH URINE  Final   Special Requests NONE  Final   Culture   Final    NO GROWTH Performed at [facility], [place] [address], [place], [place] [address]    Report Status 03/13/2019 FINAL  Final     Labs: BNP (last 3 results) Recent Labs    03/11/19 2000  BNP 099.8*   Basic Metabolic Panel: Recent Labs  Lab 03/13/19 0744 03/14/19 0525 03/15/19 0456 03/15/19 1831 03/16/19 0415  NA 141 140 143 144 142  K 3.8 3.7 3.2* 3.6 3.8  CL 109 108 114* 113*  112*  CO2 25 25 20* 22 21*  GLUCOSE 95 265* 107* 101* 277*  BUN 9 10 9  7* 7*  CREATININE 0.48* 0.55* 0.65 0.55* 0.61  CALCIUM 7.8* 7.6* 7.6* 8.0* 7.7*  MG 1.8 1.6* 1.8  --  1.7   Liver Function Tests: Recent Labs  Lab 03/13/19 0744 03/14/19 0525  AST 26 21  ALT 31 27  ALKPHOS 231* 214*  BILITOT 0.6 0.6  PROT 5.0* 4.5*  ALBUMIN 2.0* 1.7*   No results for input(s): LIPASE, AMYLASE in the last 168 hours. No results for input(s): AMMONIA in the last 168 hours. CBC: Recent Labs  Lab 03/13/19 0744 03/14/19 0525 03/15/19 0456 03/16/19 0415  WBC 10.7* 8.2 9.6 9.9  HGB 13.3 11.6* 11.7* 10.7*  HCT 38.7* 33.8* 34.6* 31.2*  MCV 95.3 95.2 98.9 96.9  PLT 162 165 161 161   Cardiac Enzymes: No results for input(s): CKTOTAL, CKMB, CKMBINDEX, TROPONINI in the last 168 hours. BNP: Invalid input(s): POCBNP CBG: Recent Labs  Lab 03/18/19 1629 03/18/19 2011 03/19/19 0025 03/19/19 0444 03/19/19 0741  GLUCAP 195* 195* 74 138* 85   D-Dimer No results for input(s): DDIMER in the last 72 hours. Hgb A1c No  results for input(s): HGBA1C in the last 72 hours. Lipid Profile No results for input(s): CHOL, HDL, LDLCALC, TRIG, CHOLHDL, LDLDIRECT in the last 72 hours. Thyroid function studies No results for input(s): TSH, T4TOTAL, T3FREE, THYROIDAB in the last 72 hours.  Invalid input(s): FREET3 Anemia work up No results for input(s): VITAMINB12, FOLATE, FERRITIN, TIBC, IRON, RETICCTPCT in the last 72 hours. Urinalysis    Component Value Date/Time   COLORURINE YELLOW 03/12/2019 0508   APPEARANCEUR CLOUDY (A) 03/12/2019 0508   LABSPEC 1.006 03/12/2019 0508   PHURINE 6.0 03/12/2019 0508   GLUCOSEU >=500 (A) 03/12/2019 0508   HGBUR NEGATIVE 03/12/2019 0508   BILIRUBINUR NEGATIVE 03/12/2019 0508   KETONESUR NEGATIVE 03/12/2019 0508   PROTEINUR NEGATIVE 03/12/2019 0508   UROBILINOGEN 0.2 12/07/2014 0555   NITRITE NEGATIVE 03/12/2019 0508   LEUKOCYTESUR LARGE (A) 03/12/2019 0508    Sepsis Labs Invalid input(s): PROCALCITONIN,  WBC,  LACTICIDVEN Microbiology Recent Results (from the past 240 hour(s))  Culture, blood (routine x 2)     Status: None   Collection Time: 03/10/19  2:20 PM   Specimen: BLOOD LEFT HAND  Result Value Ref Range Status   Specimen Description BLOOD LEFT HAND  Final   Special Requests   Final    BOTTLES DRAWN AEROBIC ONLY Blood Culture results may not be optimal due to an inadequate volume of blood received in culture bottles Performed at [facility] [address], [place], [place] [address]    Culture NO GROWTH 5 DAYS  Final   Report Status 03/15/2019 FINAL  Final  Culture, blood (routine x 2)     Status: None   Collection Time: 03/10/19  4:50 PM   Specimen: BLOOD  Result Value Ref Range Status   Specimen Description BLOOD LEFT ARM  Final   Special Requests   Final    BOTTLES DRAWN AEROBIC AND ANAEROBIC Blood Culture adequate volume   Culture NO GROWTH 5 DAYS  Final   Report Status 03/15/2019 FINAL  Final  SARS CORONAVIRUS 2 (TAT 6-24 HRS) Nasopharyngeal Nasopharyngeal Swab     Status: None   Collection Time: 03/10/19  5:14 PM   Specimen: Nasopharyngeal Swab  Result Value Ref Range Status   SARS Coronavirus 2 NEGATIVE NEGATIVE Final    Comment: (NOTE) SARS-CoV-2 target nucleic acids are NOT DETECTED. The SARS-CoV-2 RNA is generally detectable in upper and lower respiratory specimens during the acute phase of infection. Negative results do not preclude SARS-CoV-2 infection, do not rule out co-infections with other pathogens, and should not be used as the sole basis for treatment or other patient management decisions. Negative results must be combined with clinical observations, patient history, and epidemiological information. The expected result is Negative. Fact Sheet for Patients: SugarRoll.be Fact Sheet for Healthcare Providers: https://www.woods-mathews.com/ This test is not  yet approved or cleared by the [place] FDA and  has been authorized for detection and/or diagnosis of SARS-CoV-2 by FDA under an Emergency Use Authorization (EUA). This EUA will remain  in effect (meaning this test can be used) for the duration of the COVID-19 declaration under Section 56 4(b)(1) of the Act, 21 U.S.C. section 360bbb-3(b)(1), unless the authorization is terminated or revoked sooner. Performed at [facility], [place] [address], [place], [place] [address]   Urine culture     Status: None   Collection Time: 03/12/19  7:49 AM   Specimen: In/Out Cath Urine  Result Value Ref Range Status   Specimen Description IN/OUT CATH URINE  Final   Special Requests NONE  Final   Culture   Final    NO GROWTH Performed at [facility], [place] [address], [place], [place] [address]    Report Status 03/13/2019 FINAL  Final     Time coordinating discharge: Over 30 minutes  SIGNED:   [name], MD  [facility] 03/19/2019, 10:41 AM Pager   If 7PM-7AM, please contact night-coverage [email or web address] Password [email or web address]

## 2019-03-19 NOTE — Progress Notes (Signed)
SLP Cancellation Note  Patient Details Name: Michael [name] MRN: [id number] DOB: [birth year]   Cancelled treatment:       Reason Eval/Treat Not Completed: Patient at procedure or test/unavailable(Pt was ambulating with PT during prior attempt and is currently with RN. SLP will re-attempt as able)  [name] I. Hardin Negus, [place], [place] Office number [phone] Pager [place] 03/19/2019, 10:25 AM

## 2019-03-19 NOTE — Progress Notes (Signed)
Physical Therapy Treatment Patient Details Name: Michael [name] MRN: [id number] DOB: 1955/10/27 Today's Date: 03/19/2019    History of Present Illness Pt is a 64 y/o male admitted secondary to COPD exacerbation and sepsis. Pt also with seizure like activity prior to admission. CT of head negative for acute abnormality. PMH includes DM, alcohol abuse, tobacco use, HTN, hepatitis B, bipolar disorder, and pancreatitis.     PT Comments    Pt in bed upon arrival of PT, agreeable to session with focus on progressive mobility and balance. The pt continues to present with limitations in functional mobility and dynamic stability compared to his prior level of function and independence due to above dx. However, the pt was able to demo good stability and awareness with ambulation in hallway, with good management of RW when he stopped to interact with multiple staff members. The pt will continue to benefit from skilled PT to progress safety and balance, but will be safe to return home with some assist from family/friends.     Follow Up Recommendations  Supervision/Assistance - 24 hour;Home health PT     Equipment Recommendations  None recommended by PT    Recommendations for Other Services       Precautions / Restrictions Precautions Precautions: Fall Precaution Comments: pt fell while ambulating in hallway unsupervised 2/21 Restrictions Weight Bearing Restrictions: No    Mobility  Bed Mobility Overal bed mobility: Needs Assistance Bed Mobility: Supine to Sit     Supine to sit: Supervision     General bed mobility comments: no physical assist required  Transfers Overall transfer level: Needs assistance Equipment used: None Transfers: Sit to/from Stand           General transfer comment: pt able to stand without RW, no assist needed.  Ambulation/Gait Ambulation/Gait assistance: Supervision Gait Distance (Feet): 300 Feet Assistive device: Rolling walker (2 wheeled) Gait  Pattern/deviations: Step-through pattern;Trunk flexed;Narrow base of support;Decreased stride length Gait velocity: 0.57 m/s Gait velocity interpretation: 1.31 - 2.62 ft/sec, indicative of limited community ambulator General Gait Details: supervision for ambulation in hallway, no assist needed. Pt challenged with head turns, slight lateral drift with head movements, no LOB   Stairs             Wheelchair Mobility    Modified Rankin (Stroke Patients Only)       Balance Overall balance assessment: Needs assistance Sitting-balance support: No upper extremity supported Sitting balance-Leahy Scale: Normal Sitting balance - Comments: mod I   Standing balance support: During functional activity;Bilateral upper extremity supported Standing balance-Leahy Scale: Fair Standing balance comment: pt able to static stand without RW, prefers RW for ambulation                            Cognition Arousal/Alertness: Awake/alert Behavior During Therapy: WFL for tasks assessed/performed Overall Cognitive Status: No family/caregiver present to determine baseline cognitive functioning                                 General Comments: Pt eventually agreeable to commands, but required sig increase in education and explanation. Refusing balance challenges      Exercises      General Comments        Pertinent Vitals/Pain Pain Assessment: Faces Faces Pain Scale: Hurts even more Pain Location: bilateral feet,pt reports improved with ambulation but also that they are too sore to practice  SLS Pain Descriptors / Indicators: Aching;Sore;Grimacing Pain Intervention(s): Limited activity within patient's tolerance;Monitored during session    Home Living                      Prior Function            PT Goals (current goals can now be found in the care plan section) Acute Rehab PT Goals Patient Stated Goal: to make my bed PT Goal Formulation: With  patient Time For Goal Achievement: 03/27/19 Potential to Achieve Goals: Good Progress towards PT goals: Progressing toward goals    Frequency    Min 2X/week      PT Plan Current plan remains appropriate    Co-evaluation              AM-PAC PT "6 Clicks" Mobility   Outcome Measure  Help needed turning from your back to your side while in a flat bed without using bedrails?: None Help needed moving from lying on your back to sitting on the side of a flat bed without using bedrails?: None Help needed moving to and from a bed to a chair (including a wheelchair)?: A Little Help needed standing up from a chair using your arms (e.g., wheelchair or bedside chair)?: None Help needed to walk in hospital room?: A Little Help needed climbing 3-5 steps with a railing? : A Little 6 Click Score: 21    End of Session Equipment Utilized During Treatment: Gait belt Activity Tolerance: Patient tolerated treatment well Patient left: with call bell/phone within reach;in chair;with chair alarm set Nurse Communication: Mobility status PT Visit Diagnosis: Unsteadiness on feet (R26.81);Muscle weakness (generalized) (M62.81);Pain Pain - Right/Left: (bilat) Pain - part of body: Ankle and joints of foot     Time: [id number] PT Time Calculation (min) (ACUTE ONLY): 27 min  Charges:  $Gait Training: 23-37 mins                     Karma Ganja, PT, DPT   Acute Rehabilitation Department Pager #: [phone]   Otho Bellows 03/19/2019, 11:02 AM

## 2019-03-29 ENCOUNTER — Inpatient Hospital Stay (HOSPITAL_COMMUNITY)
Admission: EM | Admit: 2019-03-29 | Discharge: 2019-03-31 | DRG: 637 | Disposition: A | Discharge status: Home / Self Care | Payer: Medicare (Managed Care) | Attending: Family Medicine | Admitting: Family Medicine

## 2019-03-29 ENCOUNTER — Emergency Department (HOSPITAL_COMMUNITY): Payer: Medicare (Managed Care)

## 2019-03-29 DIAGNOSIS — F17210 Nicotine dependence, cigarettes, uncomplicated: Secondary | ICD-10-CM | POA: Diagnosis present

## 2019-03-29 DIAGNOSIS — E876 Hypokalemia: Secondary | ICD-10-CM

## 2019-03-29 DIAGNOSIS — Z833 Family history of diabetes mellitus: Secondary | ICD-10-CM

## 2019-03-29 DIAGNOSIS — T383X5A Adverse effect of insulin and oral hypoglycemic [antidiabetic] drugs, initial encounter: Secondary | ICD-10-CM | POA: Diagnosis not present

## 2019-03-29 DIAGNOSIS — Z79899 Other long term (current) drug therapy: Secondary | ICD-10-CM

## 2019-03-29 DIAGNOSIS — E160 Drug-induced hypoglycemia without coma: Secondary | ICD-10-CM

## 2019-03-29 DIAGNOSIS — K219 Gastro-esophageal reflux disease without esophagitis: Secondary | ICD-10-CM | POA: Diagnosis present

## 2019-03-29 DIAGNOSIS — Z20822 Contact with and (suspected) exposure to covid-19: Secondary | ICD-10-CM | POA: Diagnosis present

## 2019-03-29 DIAGNOSIS — E872 Acidosis: Secondary | ICD-10-CM | POA: Diagnosis present

## 2019-03-29 DIAGNOSIS — E162 Hypoglycemia, unspecified: Secondary | ICD-10-CM

## 2019-03-29 DIAGNOSIS — I10 Essential (primary) hypertension: Secondary | ICD-10-CM | POA: Diagnosis present

## 2019-03-29 DIAGNOSIS — R569 Unspecified convulsions: Secondary | ICD-10-CM

## 2019-03-29 DIAGNOSIS — B1910 Unspecified viral hepatitis B without hepatic coma: Secondary | ICD-10-CM | POA: Diagnosis present

## 2019-03-29 DIAGNOSIS — F1020 Alcohol dependence, uncomplicated: Secondary | ICD-10-CM | POA: Diagnosis present

## 2019-03-29 DIAGNOSIS — Z720 Tobacco use: Secondary | ICD-10-CM | POA: Diagnosis present

## 2019-03-29 DIAGNOSIS — Z8619 Personal history of other infectious and parasitic diseases: Secondary | ICD-10-CM | POA: Diagnosis present

## 2019-03-29 DIAGNOSIS — Z794 Long term (current) use of insulin: Secondary | ICD-10-CM

## 2019-03-29 DIAGNOSIS — F319 Bipolar disorder, unspecified: Secondary | ICD-10-CM | POA: Diagnosis present

## 2019-03-29 DIAGNOSIS — Z7982 Long term (current) use of aspirin: Secondary | ICD-10-CM

## 2019-03-29 DIAGNOSIS — G40909 Epilepsy, unspecified, not intractable, without status epilepticus: Secondary | ICD-10-CM | POA: Diagnosis present

## 2019-03-29 DIAGNOSIS — F209 Schizophrenia, unspecified: Secondary | ICD-10-CM | POA: Diagnosis present

## 2019-03-29 DIAGNOSIS — T68XXXA Hypothermia, initial encounter: Secondary | ICD-10-CM

## 2019-03-29 DIAGNOSIS — D509 Iron deficiency anemia, unspecified: Secondary | ICD-10-CM | POA: Diagnosis present

## 2019-03-29 DIAGNOSIS — F419 Anxiety disorder, unspecified: Secondary | ICD-10-CM | POA: Diagnosis present

## 2019-03-29 DIAGNOSIS — G8929 Other chronic pain: Secondary | ICD-10-CM | POA: Diagnosis present

## 2019-03-29 DIAGNOSIS — E785 Hyperlipidemia, unspecified: Secondary | ICD-10-CM | POA: Diagnosis present

## 2019-03-29 DIAGNOSIS — E11649 Type 2 diabetes mellitus with hypoglycemia without coma: Secondary | ICD-10-CM | POA: Diagnosis not present

## 2019-03-29 DIAGNOSIS — Z791 Long term (current) use of non-steroidal anti-inflammatories (NSAID): Secondary | ICD-10-CM

## 2019-03-29 DIAGNOSIS — G9341 Metabolic encephalopathy: Secondary | ICD-10-CM | POA: Diagnosis present

## 2019-03-29 DIAGNOSIS — R680 Hypothermia, not associated with low environmental temperature: Secondary | ICD-10-CM | POA: Diagnosis present

## 2019-03-29 DIAGNOSIS — E1142 Type 2 diabetes mellitus with diabetic polyneuropathy: Secondary | ICD-10-CM | POA: Diagnosis present

## 2019-03-29 LAB — CBG MONITORING, ED
Glucose-Capillary: 26 mg/dL — CL (ref 70–99)
Glucose-Capillary: 74 mg/dL (ref 70–99)
Glucose-Capillary: 98 mg/dL (ref 70–99)
Glucose-Capillary: 50 mg/dL — ABNORMAL LOW (ref 70–99)
Glucose-Capillary: 52 mg/dL — ABNORMAL LOW (ref 70–99)
Glucose-Capillary: 145 mg/dL — ABNORMAL HIGH (ref 70–99)
Glucose-Capillary: 48 mg/dL — ABNORMAL LOW (ref 70–99)
Glucose-Capillary: 156 mg/dL — ABNORMAL HIGH (ref 70–99)
Glucose-Capillary: 195 mg/dL — ABNORMAL HIGH (ref 70–99)
Glucose-Capillary: 344 mg/dL — ABNORMAL HIGH (ref 70–99)

## 2019-03-29 LAB — COMPREHENSIVE METABOLIC PANEL
ALT: 40 U/L (ref 0–44)
AST: 29 U/L (ref 15–41)
Albumin: 2.1 g/dL — ABNORMAL LOW (ref 3.5–5.0)
Alkaline Phosphatase: 196 U/L — ABNORMAL HIGH (ref 38–126)
Anion gap: 8 (ref 5–15)
BUN: <5 mg/dL — ABNORMAL LOW (ref 8–23)
CO2: 27 mmol/L (ref 22–32)
Calcium: 8.3 mg/dL — ABNORMAL LOW (ref 8.9–10.3)
Chloride: 107 mmol/L (ref 98–111)
Creatinine, Ser: 0.45 mg/dL — ABNORMAL LOW (ref 0.61–1.24)
GFR calc Af Amer: >60 mL/min (ref >60)
GFR calc non Af Amer: >60 mL/min (ref >60)
Glucose, Bld: 80 mg/dL (ref 70–99)
Potassium: 2.6 mmol/L — CL (ref 3.5–5.1)
Sodium: 142 mmol/L (ref 135–145)
Total Bilirubin: 0.5 mg/dL (ref 0.3–1.2)
Total Protein: 4.8 g/dL — ABNORMAL LOW (ref 6.5–8.1)

## 2019-03-29 LAB — CBC WITH DIFFERENTIAL/PLATELET
Abs Immature Granulocytes: 0.05 10*3/uL (ref 0.00–0.07)
Basophils Absolute: 0 10*3/uL (ref 0.0–0.1)
Basophils Relative: 0 %
Eosinophils Absolute: 0 10*3/uL (ref 0.0–0.5)
Eosinophils Relative: 0 %
HCT: 31.8 % — ABNORMAL LOW (ref 39.0–52.0)
Hemoglobin: 10.5 g/dL — ABNORMAL LOW (ref 13.0–17.0)
Immature Granulocytes: 0 %
Lymphocytes Relative: 16 %
Lymphs Abs: 1.8 10*3/uL (ref 0.7–4.0)
MCH: 33.9 pg (ref 26.0–34.0)
MCHC: 33 g/dL (ref 30.0–36.0)
MCV: 102.6 fL — ABNORMAL HIGH (ref 80.0–100.0)
Monocytes Absolute: 1 10*3/uL (ref 0.1–1.0)
Monocytes Relative: 9 %
Neutro Abs: 8.8 10*3/uL — ABNORMAL HIGH (ref 1.7–7.7)
Neutrophils Relative %: 75 %
Platelets: 134 10*3/uL — ABNORMAL LOW (ref 150–400)
RBC: 3.1 MIL/uL — ABNORMAL LOW (ref 4.22–5.81)
RDW: 15.6 % — ABNORMAL HIGH (ref 11.5–15.5)
WBC: 11.7 10*3/uL — ABNORMAL HIGH (ref 4.0–10.5)
nRBC: 0 % (ref 0.0–0.2)

## 2019-03-29 LAB — URINALYSIS, ROUTINE W REFLEX MICROSCOPIC
Bilirubin Urine: NEGATIVE
Glucose, UA: 50 mg/dL — AB
Hgb urine dipstick: NEGATIVE
Ketones, ur: NEGATIVE mg/dL
Leukocytes,Ua: NEGATIVE
Nitrite: NEGATIVE
Protein, ur: NEGATIVE mg/dL
Specific Gravity, Urine: 1.004 — ABNORMAL LOW (ref 1.005–1.030)
pH: 8 (ref 5.0–8.0)

## 2019-03-29 LAB — CBC
HCT: 31.8 % — ABNORMAL LOW (ref 39.0–52.0)
Hemoglobin: 10.5 g/dL — ABNORMAL LOW (ref 13.0–17.0)
MCH: 34 pg (ref 26.0–34.0)
MCHC: 33 g/dL (ref 30.0–36.0)
MCV: 102.9 fL — ABNORMAL HIGH (ref 80.0–100.0)
Platelets: 133 10*3/uL — ABNORMAL LOW (ref 150–400)
RBC: 3.09 MIL/uL — ABNORMAL LOW (ref 4.22–5.81)
RDW: 15.5 % (ref 11.5–15.5)
WBC: 11.8 10*3/uL — ABNORMAL HIGH (ref 4.0–10.5)
nRBC: 0 % (ref 0.0–0.2)

## 2019-03-29 LAB — ETHANOL: Alcohol, Ethyl (B): <10 mg/dL (ref <10)

## 2019-03-29 LAB — LACTIC ACID, PLASMA: Lactic Acid, Venous: 2.8 mmol/L (ref 0.5–1.9)

## 2019-03-29 LAB — PROTIME-INR
INR: 1.1 (ref 0.8–1.2)
Prothrombin Time: 14.6 seconds (ref 11.4–15.2)

## 2019-03-29 LAB — BRAIN NATRIURETIC PEPTIDE: B Natriuretic Peptide: 673.3 pg/mL — ABNORMAL HIGH (ref 0.0–100.0)

## 2019-03-29 LAB — CK: Total CK: 105 U/L (ref 49–397)

## 2019-03-29 LAB — MAGNESIUM: Magnesium: 2.1 mg/dL (ref 1.7–2.4)

## 2019-03-29 LAB — AMMONIA: Ammonia: 38 umol/L — ABNORMAL HIGH (ref 9–35)

## 2019-03-29 LAB — SARS CORONAVIRUS 2 (TAT 6-24 HRS): SARS Coronavirus 2: NEGATIVE

## 2019-03-29 MED ORDER — PRAVASTATIN SODIUM 10 MG PO TABS
10.0000 mg | ORAL_TABLET | Freq: Every day | ORAL | Status: DC
  Administered 2019-03-29 – 2019-03-30 (×2): 10 mg via ORAL
  Filled 2019-03-29 (×2): qty 1

## 2019-03-29 MED ORDER — DOCUSATE SODIUM 100 MG PO CAPS
100.0000 mg | ORAL_CAPSULE | Freq: Two times a day (BID) | ORAL | Status: DC
  Administered 2019-03-30 (×2): 100 mg via ORAL
  Filled 2019-03-29 (×2): qty 1

## 2019-03-29 MED ORDER — LORAZEPAM 1 MG PO TABS: 1.0000 mg | ORAL_TABLET | ORAL | Status: DC | PRN

## 2019-03-29 MED ORDER — HYDROXYZINE HCL 25 MG PO TABS: 25.0000 mg | ORAL_TABLET | Freq: Two times a day (BID) | ORAL | Status: DC | PRN

## 2019-03-29 MED ORDER — QUETIAPINE FUMARATE ER 50 MG PO TB24
150.0000 mg | ORAL_TABLET | Freq: Every day | ORAL | Status: DC
  Administered 2019-03-29 – 2019-03-30 (×2): 150 mg via ORAL
  Filled 2019-03-29 (×3): qty 3

## 2019-03-29 MED ORDER — LACTATED RINGERS IV BOLUS
500.0000 mL | Freq: Once | INTRAVENOUS | Status: AC
  Administered 2019-03-29: 500 mL via INTRAVENOUS

## 2019-03-29 MED ORDER — GABAPENTIN 300 MG PO CAPS
300.0000 mg | ORAL_CAPSULE | Freq: Three times a day (TID) | ORAL | Status: DC
  Administered 2019-03-29 – 2019-03-31 (×5): 300 mg via ORAL
  Filled 2019-03-29 (×5): qty 1

## 2019-03-29 MED ORDER — SODIUM CHLORIDE 0.9 % IV SOLN: 2.0000 g | Freq: Three times a day (TID) | INTRAVENOUS | Status: DC

## 2019-03-29 MED ORDER — SODIUM CHLORIDE 0.9% FLUSH
3.0000 mL | Freq: Two times a day (BID) | INTRAVENOUS | Status: DC
  Administered 2019-03-29 – 2019-03-30 (×2): 3 mL via INTRAVENOUS

## 2019-03-29 MED ORDER — ARIPIPRAZOLE 2 MG PO TABS: 2.0000 mg | ORAL_TABLET | Freq: Every day | ORAL | Status: DC

## 2019-03-29 MED ORDER — SODIUM CHLORIDE 0.9 % IV SOLN
2.0000 g | Freq: Once | INTRAVENOUS | Status: AC
  Administered 2019-03-29: 2 g via INTRAVENOUS
  Filled 2019-03-29: qty 2

## 2019-03-29 MED ORDER — LORAZEPAM 2 MG/ML IJ SOLN: 1.0000 mg | INTRAMUSCULAR | Status: DC | PRN

## 2019-03-29 MED ORDER — INSULIN ASPART 100 UNIT/ML ~~LOC~~ SOLN
0.0000 [IU] | Freq: Three times a day (TID) | SUBCUTANEOUS | Status: DC
  Administered 2019-03-31 (×2): 5 [IU] via SUBCUTANEOUS

## 2019-03-29 MED ORDER — DEXTROSE 50 % IV SOLN
1.0000 | Freq: Once | INTRAVENOUS | Status: AC
  Administered 2019-03-29: 50 mL via INTRAVENOUS

## 2019-03-29 MED ORDER — VANCOMYCIN HCL 750 MG/150ML IV SOLN: 750.0000 mg | Freq: Two times a day (BID) | INTRAVENOUS | Status: DC

## 2019-03-29 MED ORDER — THIAMINE HCL 100 MG/ML IJ SOLN: 100.0000 mg | Freq: Every day | INTRAMUSCULAR | Status: DC

## 2019-03-29 MED ORDER — NALOXONE HCL 0.4 MG/ML IJ SOLN: 0.4000 mg | Freq: Once | INTRAMUSCULAR | Status: DC

## 2019-03-29 MED ORDER — POTASSIUM CHLORIDE 10 MEQ/100ML IV SOLN
10.0000 meq | INTRAVENOUS | Status: AC
  Administered 2019-03-29 (×4): 10 meq via INTRAVENOUS
  Filled 2019-03-29 (×4): qty 100

## 2019-03-29 MED ORDER — ADULT MULTIVITAMIN W/MINERALS CH
1.0000 | ORAL_TABLET | Freq: Every day | ORAL | Status: DC
  Administered 2019-03-30 – 2019-03-31 (×2): 1 via ORAL
  Filled 2019-03-29 (×2): qty 1

## 2019-03-29 MED ORDER — SODIUM CHLORIDE 0.9% FLUSH: 3.0000 mL | Freq: Two times a day (BID) | INTRAVENOUS | Status: DC

## 2019-03-29 MED ORDER — DEXTROSE 50 % IV SOLN
1.0000 | Freq: Once | INTRAVENOUS | Status: AC
  Administered 2019-03-29: 50 mL via INTRAVENOUS
  Filled 2019-03-29: qty 50

## 2019-03-29 MED ORDER — DEXTROSE 50 % IV SOLN
12.5000 g | Freq: Once | INTRAVENOUS | Status: AC
  Administered 2019-03-29: 12.5 g via INTRAVENOUS
  Filled 2019-03-29: qty 50

## 2019-03-29 MED ORDER — OXYCODONE HCL 5 MG PO TABS
5.0000 mg | ORAL_TABLET | Freq: Three times a day (TID) | ORAL | Status: DC | PRN
  Administered 2019-03-29 – 2019-03-31 (×2): 5 mg via ORAL
  Filled 2019-03-29 (×2): qty 1

## 2019-03-29 MED ORDER — OCTREOTIDE LOAD VIA INFUSION: 50.0000 ug | Freq: Once | INTRAVENOUS | Status: DC

## 2019-03-29 MED ORDER — SODIUM CHLORIDE 0.9% FLUSH: 3.0000 mL | INTRAVENOUS | Status: DC | PRN

## 2019-03-29 MED ORDER — ENOXAPARIN SODIUM 40 MG/0.4ML ~~LOC~~ SOLN
40.0000 mg | SUBCUTANEOUS | Status: DC
  Filled 2019-03-29: qty 0.4

## 2019-03-29 MED ORDER — ONDANSETRON HCL 4 MG PO TABS: 4.0000 mg | ORAL_TABLET | Freq: Four times a day (QID) | ORAL | Status: DC | PRN

## 2019-03-29 MED ORDER — OCTREOTIDE ACETATE 50 MCG/ML IJ SOLN
50.0000 ug | Freq: Once | INTRAMUSCULAR | Status: AC
  Administered 2019-03-29: 50 ug via INTRAVENOUS
  Filled 2019-03-29: qty 1

## 2019-03-29 MED ORDER — FOLIC ACID 1 MG PO TABS
1.0000 mg | ORAL_TABLET | Freq: Every day | ORAL | Status: DC
  Administered 2019-03-30 – 2019-03-31 (×2): 1 mg via ORAL
  Filled 2019-03-29 (×2): qty 1

## 2019-03-29 MED ORDER — PANTOPRAZOLE SODIUM 40 MG PO TBEC
40.0000 mg | DELAYED_RELEASE_TABLET | Freq: Every day | ORAL | Status: DC
  Administered 2019-03-30 – 2019-03-31 (×2): 40 mg via ORAL
  Filled 2019-03-29 (×2): qty 1

## 2019-03-29 MED ORDER — SODIUM CHLORIDE 0.9 % IV SOLN: 250.0000 mL | INTRAVENOUS | Status: DC | PRN

## 2019-03-29 MED ORDER — THIAMINE HCL 100 MG PO TABS
100.0000 mg | ORAL_TABLET | Freq: Every day | ORAL | Status: DC
  Administered 2019-03-30 – 2019-03-31 (×2): 100 mg via ORAL
  Filled 2019-03-29 (×2): qty 1

## 2019-03-29 MED ORDER — DULOXETINE HCL 30 MG PO CPEP
30.0000 mg | ORAL_CAPSULE | Freq: Every day | ORAL | Status: DC
  Administered 2019-03-30 – 2019-03-31 (×2): 30 mg via ORAL
  Filled 2019-03-29 (×2): qty 1

## 2019-03-29 MED ORDER — ONDANSETRON HCL 4 MG/2ML IJ SOLN: 4.0000 mg | Freq: Four times a day (QID) | INTRAMUSCULAR | Status: DC | PRN

## 2019-03-29 MED ORDER — DEXTROSE 10 % IV SOLN: INTRAVENOUS | Status: DC

## 2019-03-29 MED ORDER — VANCOMYCIN HCL IN DEXTROSE 1-5 GM/200ML-% IV SOLN
1000.0000 mg | Freq: Once | INTRAVENOUS | Status: AC
  Administered 2019-03-29: 1000 mg via INTRAVENOUS
  Filled 2019-03-29: qty 200

## 2019-03-29 MED ORDER — NICOTINE 14 MG/24HR TD PT24
14.0000 mg | MEDICATED_PATCH | Freq: Every day | TRANSDERMAL | Status: DC
  Administered 2019-03-30 – 2019-03-31 (×2): 14 mg via TRANSDERMAL
  Filled 2019-03-29 (×2): qty 1

## 2019-03-29 MED ORDER — OXYCODONE-ACETAMINOPHEN 10-325 MG PO TABS: 1.0000 | ORAL_TABLET | Freq: Three times a day (TID) | ORAL | Status: DC | PRN

## 2019-03-29 MED ORDER — POTASSIUM CHLORIDE CRYS ER 20 MEQ PO TBCR
40.0000 meq | EXTENDED_RELEASE_TABLET | Freq: Two times a day (BID) | ORAL | Status: AC
  Administered 2019-03-29 – 2019-03-30 (×2): 40 meq via ORAL
  Filled 2019-03-29 (×2): qty 2

## 2019-03-29 MED ORDER — OXYCODONE-ACETAMINOPHEN 5-325 MG PO TABS
1.0000 | ORAL_TABLET | Freq: Three times a day (TID) | ORAL | Status: DC | PRN
  Administered 2019-03-29 – 2019-03-31 (×3): 1 via ORAL
  Filled 2019-03-29 (×3): qty 1

## 2019-03-29 MED ORDER — METRONIDAZOLE IN NACL 5-0.79 MG/ML-% IV SOLN
500.0000 mg | Freq: Once | INTRAVENOUS | Status: AC
  Administered 2019-03-29: 500 mg via INTRAVENOUS
  Filled 2019-03-29: qty 100

## 2019-03-29 NOTE — ED Notes (Signed)
Both feet red and swollen the pt cannot remember how long they have been this way    The pt is a smoker  He reports that he does not drink alcohol ???? Stool cleaned from him again third time c/o being cold still blankets given again he will not keep  His blankets he will not keep  Them in place
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
## 2019-03-29 NOTE — ED Notes (Signed)
therwe is now an order for lactated ringers  2 other iv antibiotics have to be infused and a potassium run  He needs the d10 iv  And the antibiotics do not run with lr will need to wait for some of the   Other essentials to infuse

## 2019-03-29 NOTE — ED Notes (Signed)
The p asking to see his doctor  He does not feel like he is getting very good treatment  Still cursing about everything  Still c/o his missing money

## 2019-03-29 NOTE — ED Notes (Signed)
PAGED TRIAD TO RN CHRIS CHRISCO--Michael [name]

## 2019-03-29 NOTE — ED Provider Notes (Signed)
[facility] EMERGENCY DEPARTMENT Provider Note   CSN: [id number] Arrival date & time: 03/29/19  1358  LEVEL 5 CAVEAT - ALTERED MENTAL STATUS   History Chief Complaint  Patient presents with  . Seizures  . Hypoglycemia    Michael [name] is a 64 y.o. male.  HPI 63 year old male presents with hypoglycemia and seizure.  History is by EMS.  EMS was called because family found the patient unresponsive in his bathroom.  At first, patient seemed to be looking at EMS but then was unresponsive and having intermittent tonic arms suggestive of seizure.  Glucose was around 35.  They gave IM glucagon but could not get an IV.  Patient has had this issue before per chart review.  I called family, but the sister who answered does not live with the patient I did not know he was in the hospital.  Past Medical History:  Diagnosis Date  . Anxiety   . Arthritis    "joints; shoulders; feet" (06/08/2016)  . Bipolar disorder ([place])   . Daily headache    "7:30 - 8:00 q night" (06/08/2016)  . Diabetic peripheral neuropathy ([place])   . GERD (gastroesophageal reflux disease)   . History of hepatitis B virus infection conferring immunity 03/22/2018  . Pancreatitis   . Schizophrenia ([place])   . Seizures ([place]) 01/2016 X 2  . Type II diabetes mellitus [facility])     Patient Active Problem List   Diagnosis Date Noted  . Hypothermia 03/10/2019  . Sepsis ([place]) 03/10/2019  . Seizure ([place]) 02/26/2019  . History of hepatitis B virus infection conferring immunity 03/22/2018  . Alcohol use disorder, severe, dependence ([place]) 12/29/2017  . Malnutrition of moderate degree 03/30/2017  . Hypoglycemia 03/26/2017  . Acute metabolic encephalopathy 51/02/5850  . Other constipation 10/16/2016  . Recurrent falls 07/07/2016  . Wernicke's encephalopathy   . Bipolar disorder ([place]) 06/01/2016  . Dry skin 01/18/2016  . Peripheral vascular disease ([place]) 11/15/2015  . Hyperglycemia 09/02/2015  . Tobacco abuse  09/02/2015  . Tricompartment osteoarthritis of both knees 06/15/2015  . Vitamin D deficiency 03/12/2015  . Hyperlipidemia 02/17/2015  . Protein-calorie malnutrition, severe 11/18/2014  . Alcohol use   . Cervical arthritis 01/01/2014  . Health care maintenance 08/25/2011  . Chronic alcoholic pancreatitis ([place]) 04/20/2011  . Type 2 diabetes mellitus with peripheral neuropathy ([place]) 12/09/2010  . HTN (hypertension) 12/09/2010    Past Surgical History:  Procedure Laterality Date  . BALLOON DILATION  03/22/2011   Procedure: BALLOON DILATION;  Surgeon: Gatha Mayer, MD;  Location: [facility] ENDOSCOPY;  Service: Endoscopy;  Laterality: N/A;  . COLONOSCOPY N/A 05/22/2012   Procedure: COLONOSCOPY;  Surgeon: Gatha Mayer, MD;  Location: [facility];  Service: Endoscopy;  Laterality: N/A;  . COLONOSCOPY W/ BIOPSIES AND POLYPECTOMY  09/12/11  . ESOPHAGOGASTRODUODENOSCOPY  03/22/2011   Procedure: ESOPHAGOGASTRODUODENOSCOPY (EGD);  Surgeon: Gatha Mayer, MD;  Location: [facility];  Service: Endoscopy;  Laterality: N/A;  egd with balloon   . EUS  04/20/2011   Procedure: UPPER ENDOSCOPIC ULTRASOUND (EUS) LINEAR;  Surgeon: Milus Banister, MD;  Location: [facility];  Service: Endoscopy;  Laterality: N/A;  . KNEE ARTHROSCOPY Left        Family History  Problem Relation Age of Onset  . Hypertension Sister   . Diabetes Sister   . Heart disease Sister   . Colon cancer Neg Hx   . Stomach cancer Neg Hx   . Anesthesia problems Neg Hx   .  Hypotension Neg Hx   . Pseudochol deficiency Neg Hx   . Malignant hyperthermia Neg Hx     Social History   Tobacco Use  . Smoking status: Current Every Day Smoker    Packs/day: 0.50    Years: 42.00    Pack years: 21.00    Types: Cigarettes  . Smokeless tobacco: Never Used  . Tobacco comment: Sometimes less.  Substance Use Topics  . Alcohol use: Yes    Alcohol/week: 2.0 standard drinks    Types: 2 Cans of beer per week  . Drug use: Not Currently     Types: Marijuana    Comment: Sober for 2 years    Home Medications Prior to Admission medications   Medication Sig Start Date End Date Taking? Authorizing Provider  ACCU-CHEK SOFTCLIX LANCETS lancets Use 3 times daily to check blood sugar. diag code E11.42. Insulin dependent 03/30/17   Roxan Hockey, MD  Alcohol Swabs (B-D SINGLE USE SWABS REGULAR) PADS Patient uses 6 times a day. Patient tests 3 times per day and administers insulin 3 times a day. ICD 10 code E11.42 03/30/17   Roxan Hockey, MD  ammonium lactate (LAC-HYDRIN) 12 % cream Apply topically as needed for dry skin. 10/10/17   Gardiner Barefoot, DPM  amoxicillin-clavulanate (AUGMENTIN) 875-125 MG tablet Take 1 tablet by mouth every 12 (twelve) hours. 03/19/19   [name], MD  ARIPiprazole (ABILIFY) 2 MG tablet Take 2 mg by mouth daily.    [provider]  aspirin EC 81 MG tablet Take by mouth.    [provider]  Blood Glucose Calibration (ACCU-CHEK AVIVA) SOLN 1 drop by In Vitro route as needed. diag code E08.10 insulin dependent. Use as device directs 03/30/17   Roxan Hockey, MD  Blood Glucose Monitoring Suppl (ACCU-CHEK AVIVA PLUS) w/Device KIT 1 kit by Does not apply route every morning. diag code E11.42. Insulin dependent 03/30/17   Roxan Hockey, MD  Buprenorphine HCl (BELBUCA) 900 MCG FILM Place 1 Film inside cheek every 12 (twelve) hours.     [provider]  diclofenac sodium (VOLTAREN) 1 % GEL Apply 2 g topically 4 (four) times daily -  before meals and at bedtime. 01/05/17   Rice, Resa Miner, MD  DULoxetine (CYMBALTA) 30 MG capsule Take 30 mg by mouth daily.    [provider]  feeding supplement, GLUCERNA SHAKE, (GLUCERNA SHAKE) LIQD Take 237 mLs by mouth 3 (three) times daily between meals. 03/30/17   Roxan Hockey, MD  folic acid (FOLVITE) 1 MG tablet Take 1 tablet (1 mg total) by mouth daily. 03/30/17   Roxan Hockey, MD  gabapentin (NEURONTIN) 300 MG capsule TAKE 3  CAPSULES BY MOUTH  EVERY MORNING AND  AFTERNOON. TAKE 4 CAPSULES  BY MOUTH AT NIGHT BEFORE  BED Patient taking differently: Take 300 mg by mouth 3 (three) times daily.  01/05/17   Rice, Resa Miner, MD  glipiZIDE (GLUCOTROL) 10 MG tablet Take 10 mg by mouth daily before breakfast.     [provider]  glucose blood (ACCU-CHEK AVIVA PLUS) test strip Use 3 times daily to check blood sugar. diag code E11.42. Insulin dependent 03/30/17   Roxan Hockey, MD  hydrOXYzine (ATARAX/VISTARIL) 25 MG tablet Take 25 mg by mouth 2 (two) times daily as needed for anxiety.     [provider]  ibuprofen (ADVIL,MOTRIN) 800 MG tablet Take 800 mg by mouth 3 (three) times daily as needed for mild pain.  09/17/17   [provider]  insulin  aspart (NOVOLOG) 100 UNIT/ML FlexPen Take 5 units before breakfast,   before lunch, and 5 units before dinner (5 units before each meal) Patient taking differently: Inject 5-10 Units into the skin 3 (three) times daily with meals. Take 5 units before breakfast and 10 units before lunch and dinner 03/30/17   Emokpae, Courage, MD  LEVEMIR FLEXTOUCH 100 UNIT/ML Pen 4 units Twice a day Patient taking differently: Inject 15 Units into the skin 2 (two) times daily.  03/30/17   Roxan Hockey, MD  lipase/protease/amylase (CREON) 36000 UNITS CPEP capsule TAKE 1 CAPSULE BY MOUTH 3  TIMES DAILY BEFORE MEALS 01/05/17   Rice, Resa Miner, MD  Multiple Vitamin (MULTIVITAMIN WITH MINERALS) TABS tablet Take 1 tablet by mouth daily. 03/30/17   Roxan Hockey, MD  naloxone [facility]) nasal spray 4 mg/0.1 mL as needed. 07/13/17   [provider]  omega-3 acid ethyl esters (LOVAZA) 1 g capsule Take 1 capsule (1 g total) by mouth daily. 10/15/16   Sid Falcon, MD  oxyCODONE-acetaminophen (PERCOCET) 10-325 MG tablet Take 1 tablet by mouth every 8 (eight) hours as needed for pain.    [provider]  pantoprazole (PROTONIX) 40 MG tablet Take 1 tablet (40 mg  total) by mouth daily. 03/30/17   Roxan Hockey, MD  pravastatin (PRAVACHOL) 10 MG tablet Take 1 tablet (10 mg total) by mouth daily at 6 PM. 01/05/17   Rice, Resa Miner, MD  predniSONE (DELTASONE) 10 MG tablet 4 tablets for 1 days, then 3 tablets for 1 days, then 2 tabs for 1 days, then 1 tab for 1 days, and then stop. 03/19/19   [name], MD  QUEtiapine Fumarate (SEROQUEL XR) 150 MG 24 hr tablet Take 150 mg by mouth at bedtime.  07/20/17   [provider]  thiamine (VITAMIN B-1) 100 MG tablet Take 1 tablet (100 mg total) by mouth daily. 03/30/17   Roxan Hockey, MD    Allergies    Ace inhibitors, Losartan, and Tylenol [acetaminophen]  Review of Systems   Review of Systems  Unable to perform ROS: Mental status change    Physical Exam Updated Vital Signs BP 105/61   Pulse 67   Temp (!) 95.1 F (35.1 C) (Rectal) Comment: MD aware  Resp 14   SpO2 100%   Physical Exam Vitals and nursing note reviewed.  Constitutional:      Appearance: He is well-developed.  HENT:     Head: Normocephalic and atraumatic.     Right Ear: External ear normal.     Left Ear: External ear normal.     Nose: Nose normal.  Eyes:     General:        Right eye: No discharge.        Left eye: No discharge.  Cardiovascular:     Rate and Rhythm: Normal rate and regular rhythm.     Heart sounds: Normal heart sounds.  Pulmonary:     Effort: Pulmonary effort is normal.     Breath sounds: Rhonchi present.  Abdominal:     Palpations: Abdomen is soft.     Tenderness: There is no abdominal tenderness.  Musculoskeletal:     Cervical back: Neck supple.     Right lower leg: Edema present.     Left lower leg: Edema present.     Comments: Pitting edema to BLE  Skin:    General: Skin is warm and dry.  Neurological:     Mental Status: He is unresponsive.  GCS: GCS eye subscore is 2. GCS verbal subscore is 2. GCS motor subscore is 5.     Comments: Unresponsive. Reacts to pain and  occasionally moves all 4 extremities intermittently.   Psychiatric:        Mood and Affect: Mood is not anxious.     ED Results / Procedures / Treatments   Labs (all labs ordered are listed, but only abnormal results are displayed) Labs Reviewed  COMPREHENSIVE METABOLIC PANEL - Abnormal; Notable for the following components:      Result Value   Potassium 2.6 (*)    BUN <5 (*)    Creatinine, Ser 0.45 (*)    Calcium 8.3 (*)    Total Protein 4.8 (*)    Albumin 2.1 (*)    Alkaline Phosphatase 196 (*)    All other components within normal limits  CBC - Abnormal; Notable for the following components:   WBC 11.8 (*)    RBC 3.09 (*)    Hemoglobin 10.5 (*)    HCT 31.8 (*)    MCV 102.9 (*)    Platelets 133 (*)    All other components within normal limits  LACTIC ACID, PLASMA - Abnormal; Notable for the following components:   Lactic Acid, Venous 2.8 (*)    All other components within normal limits  AMMONIA - Abnormal; Notable for the following components:   Ammonia 38 (*)    All other components within normal limits  CBC WITH DIFFERENTIAL/PLATELET - Abnormal; Notable for the following components:   WBC 11.7 (*)    RBC 3.10 (*)    Hemoglobin 10.5 (*)    HCT 31.8 (*)    MCV 102.6 (*)    RDW 15.6 (*)    Platelets 134 (*)    Neutro Abs 8.8 (*)    All other components within normal limits  CBG MONITORING, ED - Abnormal; Notable for the following components:   Glucose-Capillary 26 (*)    All other components within normal limits  CBG MONITORING, ED - Abnormal; Notable for the following components:   Glucose-Capillary 50 (*)    All other components within normal limits  CULTURE, BLOOD (ROUTINE X 2)  CULTURE, BLOOD (ROUTINE X 2)  SARS CORONAVIRUS 2 (TAT 6-24 HRS)  CK  ETHANOL  PROTIME-INR  LACTIC ACID, PLASMA  URINALYSIS, ROUTINE W REFLEX MICROSCOPIC  BRAIN NATRIURETIC PEPTIDE  MAGNESIUM  CBG MONITORING, ED  CBG MONITORING, ED    EKG EKG  Interpretation  Date/Time:  Saturday March 29 2019 14:35:55 EST Ventricular Rate:  70 PR Interval:    QRS Duration: 76 QT Interval:  413 QTC Calculation: 446 R Axis:   62 Text Interpretation: Sinus rhythm RSR' in V1 or V2, probably normal variant Borderline T abnormalities, inferior leads nonspecific ST/T changes no significant change since Feb 2021 Confirmed by Sherwood Gambler [phone]) on 03/29/2019 2:53:54 PM   Radiology CT Head Wo Contrast  Result Date: 03/29/2019 CLINICAL DATA:  Altered mental status, unclear cause. Additional provided: Patient not responsive to staff. EXAM: CT HEAD WITHOUT CONTRAST TECHNIQUE: Contiguous axial images were obtained from the base of the skull through the vertex without intravenous contrast. COMPARISON:  Head CT 03/10/2019. FINDINGS: Brain: There is no evidence of acute intracranial hemorrhage, intracranial mass, midline shift or extra-axial fluid collection.No demarcated cortical infarction. Unchanged ill-defined hypoattenuation within the cerebral white matter which is nonspecific, but consistent with chronic small vessel ischemic disease. Redemonstrated small chronic lacunar infarct within the right thalamus. Moderate generalized parenchymal atrophy. Vascular:  No hyperdense vessel.  Atherosclerotic calcifications. Skull: Normal. Negative for fracture or focal lesion. Sinuses/Orbits: Visualized orbits demonstrate no acute abnormality. Mild scattered paranasal sinus mucosal thickening. No significant mastoid effusion at the imaged levels. IMPRESSION: No evidence of acute intracranial abnormality. Stable generalized parenchymal atrophy and chronic small vessel ischemic disease. Redemonstrated small chronic right thalamic lacunar infarct. Electronically Signed   By: Kellie Simmering DO   On: 03/29/2019 15:15   DG Chest Port 1 View  Result Date: 03/29/2019 CLINICAL DATA:  Hypoglycemia. EXAM: PORTABLE CHEST 1 VIEW COMPARISON:  Chest radiograph 03/11/2019 FINDINGS: Stable  cardiomediastinal contours. A subtle opacity persists at the right lung base. The left lung is clear. No pneumothorax or pleural effusion. No pneumothorax or significant pleural effusion. Remote right clavicular fracture. IMPRESSION: No acute finding. Redemonstrated subtle opacity at the right lung base. Recommend radiographic follow-up to resolution or alternatively CT. Electronically Signed   By: Audie Pinto M.D.   On: 03/29/2019 15:04    Procedures .Critical Care Performed by: Sherwood Gambler, MD Authorized by: Sherwood Gambler, MD   Critical care provider statement:    Critical care time (minutes):  35   Critical care was necessary to treat or prevent imminent or life-threatening deterioration of the following conditions:  Metabolic crisis and CNS failure or compromise   Critical care was time spent personally by me on the following activities:  Discussions with consultants, evaluation of patient's response to treatment, examination of patient, ordering and performing treatments and interventions, ordering and review of laboratory studies, ordering and review of radiographic studies, pulse oximetry, re-evaluation of patient's condition, obtaining history from patient or surrogate and review of old charts Ultrasound ED Peripheral IV (Provider)  Date/Time: 03/29/2019 3:13 PM Performed by: Sherwood Gambler, MD Authorized by: Sherwood Gambler, MD   Procedure details:    Indications: hydration, multiple failed IV attempts and poor IV access     Skin Prep: chlorhexidine gluconate     Location:  Right AC   Angiocath:  20 G   Bedside Ultrasound Guided: Yes     Patient tolerated procedure without complications: Yes     Dressing applied: Yes     (including critical care time)  Medications Ordered in ED Medications  dextrose 10 % infusion ( Intravenous New Bag/Given 03/29/19 1419)  sodium chloride flush (NS) 0.9 % injection 3 mL (has no administration in time range)  sodium chloride flush  (NS) 0.9 % injection 3 mL (has no administration in time range)  0.9 %  sodium chloride infusion (has no administration in time range)  ceFEPIme (MAXIPIME) 2 g in sodium chloride 0.9 % 100 mL IVPB (2 g Intravenous New Bag/Given 03/29/19 1527)  metroNIDAZOLE (FLAGYL) IVPB 500 mg (has no administration in time range)  vancomycin (VANCOCIN) IVPB 1000 mg/200 mL premix (has no administration in time range)  dextrose 50 % solution 12.5 g (has no administration in time range)  lactated ringers bolus 500 mL (has no administration in time range)  potassium chloride 10 mEq in 100 mL IVPB (has no administration in time range)  dextrose 50 % solution 50 mL (50 mLs Intravenous Given 03/29/19 1405)    ED Course  I have reviewed the triage vital signs and the nursing notes.  Pertinent labs & imaging results that were available during my care of the patient were reviewed by me and considered in my medical decision making (see chart for details).    MDM Rules/Calculators/A&P  Patient presents with altered mental state and hypoglycemia.  IV was able to be established and he was given D50.  Glucose has come up.  Slowly he has become more more awake and now is looking around though still clearly altered.  He is protecting his airway.  Vital signs are stable besides some hypothermia, probably from the hypoglycemia.  However the history is very limited so is unclear if infection is driving his hypoglycemia versus poor medication compliance versus alcohol, etc.  His glucose dropped again despite being on a D10 infusion so he will be given a second bolus of D50 and D10 drip will be increased.  Also multiple metabolic issues such as hypokalemia.  His lactate is up though is unclear if this is from seizing or sepsis.  He was given broad antibiotics and blood cultures will be obtained to help sort this out.  He will need admission. Discussed with Dr. Lorin Mercy for admission  Michael [name] was evaluated in  Emergency Department on 03/29/2019 for the symptoms described in the history of present illness. He was evaluated in the context of the global COVID-19 pandemic, which necessitated consideration that the patient might be at risk for infection with the SARS-CoV-2 virus that causes COVID-19. Institutional protocols and algorithms that pertain to the evaluation of patients at risk for COVID-19 are in a state of rapid change based on information released by regulatory bodies including the CDC and federal and state organizations. These policies and algorithms were followed during the patient's care in the ED.  Final Clinical Impression(s) / ED Diagnoses Final diagnoses:  Hypoglycemia  Hypothermia, initial encounter  Hypokalemia  Seizure [facility])    Rx / DC Orders ED Discharge Orders    None       Sherwood Gambler, MD 03/29/19 1549

## 2019-03-29 NOTE — ED Notes (Signed)
The pt continues to curse loudly about his money he does not have  His family did not answer when he called them

## 2019-03-29 NOTE — ED Triage Notes (Signed)
Pt arrives via EMS from home where family reports pt was shaking in the bathroom and CBG of 38. EMS gave 1 glucagon and 5 versed and CBG came up to 41.

## 2019-03-29 NOTE — ED Notes (Signed)
Report called to rn on 4e  Waiting for a call from the admitting doctor about the pts blood sugar

## 2019-03-29 NOTE — ED Notes (Signed)
The pt became more alert he does not redmember anything about today  He is c/o being cold  Asking for food  Given a [place] sandwich

## 2019-03-29 NOTE — ED Provider Notes (Signed)
  Physical Exam  BP (!) 150/83   Pulse 79   Temp (!) 95.1 F (35.1 C) (Rectal) Comment: MD aware  Resp (!) 27   SpO2 100%   Physical Exam  ED Course/Procedures     .Critical Care Performed by: Varney Biles, MD Authorized by: Varney Biles, MD   Critical care provider statement:    Critical care time (minutes):  32   Critical care was necessary to treat or prevent imminent or life-threatening deterioration of the following conditions:  Metabolic crisis   Critical care was time spent personally by me on the following activities:  Discussions with consultants, evaluation of patient's response to treatment, examination of patient, ordering and performing treatments and interventions, ordering and review of laboratory studies, ordering and review of radiographic studies, pulse oximetry, re-evaluation of patient's condition, obtaining history from patient or surrogate and review of old charts    MDM  Patient was seen by Dr. Regenia Skeeter. Patient was admitted to the hospital.  Hospital staff called me to reassess the patient as he is sleepy.  Patient's blood glucose has been low throughout the ED stay.  According to Dr. Lorin Mercy, patient has required admission to ICU in the past because of airway compromise.  On my reassessment patient is upright and going through his belongings, looking for money.  He has food at bedside.  Nursing staff informed me that he has been eating. I will get his medication and he is on glipizide.  We will start octreotide.  We will get CBG every hour.  His blood glucose was in the 40s so I have ordered amp of D50. For now I think patient likely needs to be in a stepdown ICU.  Dr. Lorin Mercy made aware of this.  Reassessment at 530: Patient alert.  Requesting more food.  Reassessment at 7: Patient's blood sugars have been normal x2. He continues to have no further complaints.      Varney Biles, MD 03/29/19 1924

## 2019-03-29 NOTE — Progress Notes (Signed)
Pharmacy Antibiotic Note  Michael [name] is a 64 y.o. male admitted on 03/29/2019 with sepsis.  Pharmacy has been consulted for vancomycin and cefepime dosing. Pt is afebrile and WBC is slightly elevated. Lactic acid is elevated and Scr is WNL.   Plan: Vancomycin 1gm IV x 1 then 750mg  IV Q12H  Cefepime 2gm IV Q8H F/u renal fxn, C&S, clinical status and peak/trough at SS    Temp (24hrs), Avg:95.1 F (35.1 C), Min:95.1 F (35.1 C), Max:95.1 F (35.1 C)  Recent Labs  Lab 03/29/19 1415 03/29/19 1445  WBC 11.7*  11.8*  --   CREATININE 0.45*  --   LATICACIDVEN  --  2.8*    Estimated Creatinine Clearance: 80.9 mL/min (A) (by C-G formula based on SCr of 0.45 mg/dL (L)).    Allergies  Allergen Reactions  . Ace Inhibitors Swelling and Other (See Comments)    Angioedema - unquestionable  . Losartan Swelling and Other (See Comments)    Pt presented with unquestionable angioedema on ACEIs (Angiotensin-converting enzyme (ACE) inhibitors) so aviod ARBs (Angiotensin receptor blockers), as well  . Tylenol [Acetaminophen] Other (See Comments)    "cannot have this" (contraindicated)    Antimicrobials this admission: Vanc 3/6>> Cefepime 3/6>> Flagyl x 1 3/6  Dose adjustments this admission: N/A  Microbiology results: Pending  Thank you for allowing pharmacy to be a part of this patient's care.  Fareeha Evon, Rande Lawman 03/29/2019 2:52 PM

## 2019-03-29 NOTE — ED Notes (Signed)
Pt sl opens both eyes moans otherwise no verbal response to name being called or questions asked

## 2019-03-29 NOTE — ED Notes (Signed)
2nd preassignment taken off house coverage notified

## 2019-03-29 NOTE — ED Notes (Signed)
The pt continues to curse and threaten to leave.  He is eating his 4th sandwich cursing with every breath

## 2019-03-29 NOTE — ED Notes (Signed)
Pt is sleeping

## 2019-03-29 NOTE — ED Notes (Signed)
No response from the admitting doctor  Dr Georgann Housekeeper  Has discontinued the d 10 drip  It was initiallu started by the edp

## 2019-03-29 NOTE — ED Notes (Signed)
Pt asked for more graham crackers after the RN, Delmar Landau., gave him 4 packets. This tech provided the pt with 2 more packets of graham crackers and he started yelling at this tech. Pt wasn't happy that he didn't get more than the 2 given.

## 2019-03-29 NOTE — ED Notes (Signed)
Insulin not given  pts cbg was 48  Before he received the last dextrose  iv

## 2019-03-29 NOTE — ED Notes (Addendum)
This tech went into pt's room to check his blood sugar. Pt did not want his blood sugar checked until he got more graham crackers. Pt was promised 2 more packets of graham crackers if he would allow [place] to check is blood sugar. At first the pt said okay but then when this tech asked for his hand, the pt started yelling and cursing at this tech. Pt stated, "I want fucking graham crackers and you can get the fuck out of my room." Pt has had 4-5 sandwiches and multiple graham crackers at this time and continues to be uncooperative.  RN, Delmar Landau., notified of pt's behavior.

## 2019-03-29 NOTE — ED Notes (Signed)
The pt has had 4 runs of  Potassium iv

## 2019-03-29 NOTE — H&P (Signed)
History and Physical    [name]:[id number] DOB: Dec 01, 1955 DOA: 03/29/2019  PCP: Helane Rima, MD Consultants:  Elna Breslow - ID Patient coming from:  Home; NOK: [place], [place], [phone]  Chief Complaint: Hypoglycemia  HPI: Michael [name] is a 64 y.o. male with medical history significant of ETOH dependence; tobacco dependence; Hepatitis B infection; DM; HTN; bipolar d/o; seizure d/o and recent hospitalizations x 2 presenting with hypoglycemia.  He opened eyes to voice/touch but was confused and unable to answer questions at the time of my evaluation.  He had fecal incontinence prior to my arrival.  When I went back into the room to reevaluate the patient, he was alert, awake, and sitting up in bed infuriated that "someone took" his money.  The third time I saw him it was at his request (the nurse paged me that he needed to see me); he then explained that despite a pile of empty graham cracker packages and now him eating his fourth sandwich while in the ER, he needs a "bag of graham crackers."  He was hospitalized from 2/3-4 for a seizure, requiring intubation for airway protection; he was extubated the next day and left AMA.  He returned from 2/15-24 for COPD exacerbation and aspiration.  Neurology was consulted for severe recurrent seizures and these were thought to be from hypoglycemia.   ED Course:  Hypoglycemia -> seizure. Given Glucagon, recheck.  Given D50 and started on D10.  Much more awake now, still confused.  Head CT negative.  Hypothermia, ?associated with hypoglycemia but given antibiotics.  Low K+.   Review of Systems:  Unable to perform   Past Medical History:  Diagnosis Date  . Anxiety   . Arthritis    "joints; shoulders; feet" (06/08/2016)  . Bipolar disorder ([place])   . Daily headache    "7:30 - 8:00 q night" (06/08/2016)  . Diabetic peripheral neuropathy ([place])   . GERD (gastroesophageal reflux disease)   . History of hepatitis B virus infection  conferring immunity 03/22/2018  . Pancreatitis   . Schizophrenia ([place])   . Seizures ([place]) 01/2016 X 2  . Type II diabetes mellitus ([place])     Past Surgical History:  Procedure Laterality Date  . BALLOON DILATION  03/22/2011   Procedure: BALLOON DILATION;  Surgeon: Gatha Mayer, MD;  Location: [facility];  Service: Endoscopy;  Laterality: N/A;  . COLONOSCOPY N/A 05/22/2012   Procedure: COLONOSCOPY;  Surgeon: Gatha Mayer, MD;  Location: [facility];  Service: Endoscopy;  Laterality: N/A;  . COLONOSCOPY W/ BIOPSIES AND POLYPECTOMY  09/12/11  . ESOPHAGOGASTRODUODENOSCOPY  03/22/2011   Procedure: ESOPHAGOGASTRODUODENOSCOPY (EGD);  Surgeon: Gatha Mayer, MD;  Location: [facility];  Service: Endoscopy;  Laterality: N/A;  egd with balloon   . EUS  04/20/2011   Procedure: UPPER ENDOSCOPIC ULTRASOUND (EUS) LINEAR;  Surgeon: Milus Banister, MD;  Location: [facility];  Service: Endoscopy;  Laterality: N/A;  . KNEE ARTHROSCOPY Left     Social History   Socioeconomic History  . Marital status: Single    Spouse name: Not on file  . Number of children: 0  . Years of education: 39  . Highest education level: Not on file  Occupational History  . Occupation:       Employer: [employer]    Comment: worked for 6 years  . Occupation: cook    Comment:  [employer], cook out  . Occupation: maintenance    Employer: [employer]  Tobacco Use  .  Smoking status: Current Every Day Smoker    Packs/day: 0.50    Years: 42.00    Pack years: 21.00    Types: Cigarettes  . Smokeless tobacco: Never Used  . Tobacco comment: Sometimes less.  Substance and Sexual Activity  . Alcohol use: Yes    Alcohol/week: 2.0 standard drinks    Types: 2 Cans of beer per week  . Drug use: Not Currently    Types: Marijuana    Comment: Sober for 2 years  . Sexual activity: Yes  Other Topics Concern  . Not on file  Social History Narrative   Has been living at 3 different friend's homes since  discharge 01/2011. Was living in the shelter prior to admission. Had previously lived with his niece and other family members but he has gotten kicked out each time.       06/10/16 -pt reports lives w/sister and nieces   Social Determinants of Health   Financial Resource Strain:   . Difficulty of Paying Living Expenses: Not on file  Food Insecurity:   . Worried About Charity fundraiser in the Last Year: Not on file  . Ran Out of Food in the Last Year: Not on file  Transportation Needs:   . Lack of Transportation (Medical): Not on file  . Lack of Transportation (Non-Medical): Not on file  Physical Activity:   . Days of Exercise per Week: Not on file  . Minutes of Exercise per Session: Not on file  Stress:   . Feeling of Stress : Not on file  Social Connections:   . Frequency of Communication with Friends and Family: Not on file  . Frequency of Social Gatherings with Friends and Family: Not on file  . Attends Religious Services: Not on file  . Active Member of Clubs or Organizations: Not on file  . Attends Archivist Meetings: Not on file  . Marital Status: Not on file  Intimate Partner Violence:   . Fear of Current or Ex-Partner: Not on file  . Emotionally Abused: Not on file  . Physically Abused: Not on file  . Sexually Abused: Not on file    Allergies  Allergen Reactions  . Ace Inhibitors Swelling and Other (See Comments)    Angioedema - unquestionable  . Losartan Swelling and Other (See Comments)    Pt presented with unquestionable angioedema on ACEIs (Angiotensin-converting enzyme (ACE) inhibitors) so aviod ARBs (Angiotensin receptor blockers), as well  . Tylenol [Acetaminophen] Other (See Comments)    "cannot have this" (contraindicated)    Family History  Problem Relation Age of Onset  . Hypertension Sister   . Diabetes Sister   . Heart disease Sister   . Colon cancer Neg Hx   . Stomach cancer Neg Hx   . Anesthesia problems Neg Hx   . Hypotension Neg  Hx   . Pseudochol deficiency Neg Hx   . Malignant hyperthermia Neg Hx     Prior to Admission medications   Medication Sig Start Date End Date Taking? Authorizing Provider  ACCU-CHEK SOFTCLIX LANCETS lancets Use 3 times daily to check blood sugar. diag code E11.42. Insulin dependent 03/30/17   Roxan Hockey, MD  Alcohol Swabs (B-D SINGLE USE SWABS REGULAR) PADS Patient uses 6 times a day. Patient tests 3 times per day and administers insulin 3 times a day. ICD 10 code E11.42 03/30/17   Roxan Hockey, MD  ammonium lactate (LAC-HYDRIN) 12 % cream Apply topically as needed for dry skin. 10/10/17  Gardiner Barefoot, DPM  amoxicillin-clavulanate (AUGMENTIN) 875-125 MG tablet Take 1 tablet by mouth every 12 (twelve) hours. 03/19/19   [name], MD  ARIPiprazole (ABILIFY) 2 MG tablet Take 2 mg by mouth daily.    [provider]  aspirin EC 81 MG tablet Take by mouth.    [provider]  Blood Glucose Calibration (ACCU-CHEK AVIVA) SOLN 1 drop by In Vitro route as needed. diag code E08.10 insulin dependent. Use as device directs 03/30/17   Roxan Hockey, MD  Blood Glucose Monitoring Suppl (ACCU-CHEK AVIVA PLUS) w/Device KIT 1 kit by Does not apply route every morning. diag code E11.42. Insulin dependent 03/30/17   Roxan Hockey, MD  Buprenorphine HCl (BELBUCA) 900 MCG FILM Place 1 Film inside cheek every 12 (twelve) hours.     [provider]  diclofenac sodium (VOLTAREN) 1 % GEL Apply 2 g topically 4 (four) times daily -  before meals and at bedtime. 01/05/17   Rice, Resa Miner, MD  DULoxetine (CYMBALTA) 30 MG capsule Take 30 mg by mouth daily.    [provider]  feeding supplement, GLUCERNA SHAKE, (GLUCERNA SHAKE) LIQD Take 237 mLs by mouth 3 (three) times daily between meals. 03/30/17   Roxan Hockey, MD  folic acid (FOLVITE) 1 MG tablet Take 1 tablet (1 mg total) by mouth daily. 03/30/17   Roxan Hockey, MD  gabapentin (NEURONTIN) 300 MG capsule TAKE  3 CAPSULES BY MOUTH  EVERY MORNING AND  AFTERNOON. TAKE 4 CAPSULES  BY MOUTH AT NIGHT BEFORE  BED Patient taking differently: Take 300 mg by mouth 3 (three) times daily.  01/05/17   Rice, Resa Miner, MD  glipiZIDE (GLUCOTROL) 10 MG tablet Take 10 mg by mouth daily before breakfast.     [provider]  glucose blood (ACCU-CHEK AVIVA PLUS) test strip Use 3 times daily to check blood sugar. diag code E11.42. Insulin dependent 03/30/17   Roxan Hockey, MD  hydrOXYzine (ATARAX/VISTARIL) 25 MG tablet Take 25 mg by mouth 2 (two) times daily as needed for anxiety.     [provider]  ibuprofen (ADVIL,MOTRIN) 800 MG tablet Take 800 mg by mouth 3 (three) times daily as needed for mild pain.  09/17/17   [provider]  insulin aspart (NOVOLOG) 100 UNIT/ML FlexPen Take 5 units before breakfast,   before lunch, and 5 units before dinner (5 units before each meal) Patient taking differently: Inject 5-10 Units into the skin 3 (three) times daily with meals. Take 5 units before breakfast and 10 units before lunch and dinner 03/30/17   Emokpae, Courage, MD  LEVEMIR FLEXTOUCH 100 UNIT/ML Pen 4 units Twice a day Patient taking differently: Inject 15 Units into the skin 2 (two) times daily.  03/30/17   Roxan Hockey, MD  lipase/protease/amylase (CREON) 36000 UNITS CPEP capsule TAKE 1 CAPSULE BY MOUTH 3  TIMES DAILY BEFORE MEALS 01/05/17   Rice, Resa Miner, MD  Multiple Vitamin (MULTIVITAMIN WITH MINERALS) TABS tablet Take 1 tablet by mouth daily. 03/30/17   Roxan Hockey, MD  naloxone [facility]) nasal spray 4 mg/0.1 mL as needed. 07/13/17   [provider]  omega-3 acid ethyl esters (LOVAZA) 1 g capsule Take 1 capsule (1 g total) by mouth daily. 10/15/16   Sid Falcon, MD  oxyCODONE-acetaminophen (PERCOCET) 10-325 MG tablet Take 1 tablet by mouth every 8 (eight) hours as needed for pain.    [provider]  pantoprazole (PROTONIX) 40 MG tablet Take 1 tablet (40 mg  total) by  mouth daily. 03/30/17   Roxan Hockey, MD  pravastatin (PRAVACHOL) 10 MG tablet Take 1 tablet (10 mg total) by mouth daily at 6 PM. 01/05/17   Rice, Resa Miner, MD  predniSONE (DELTASONE) 10 MG tablet 4 tablets for 1 days, then 3 tablets for 1 days, then 2 tabs for 1 days, then 1 tab for 1 days, and then stop. 03/19/19   [name], MD  QUEtiapine Fumarate (SEROQUEL XR) 150 MG 24 hr tablet Take 150 mg by mouth at bedtime.  07/20/17   [provider]  thiamine (VITAMIN B-1) 100 MG tablet Take 1 tablet (100 mg total) by mouth daily. 03/30/17   Roxan Hockey, MD    Physical Exam: Vitals:   03/29/19 1405 03/29/19 1431 03/29/19 1445 03/29/19 1500  BP: 102/63 109/67 100/74 105/61  Pulse: 76 71 68 67  Resp: 19 18 14    Temp:  (!) 95.1 F (35.1 C)    TempSrc:  Rectal    SpO2: 100% 100% 100% 100%     . General:  Appeared obtunded and generally unresponsive; on recheck, alert and angry about someone "stealing" his money . Eyes:  PERRL, EOMI, normal lids, iris . ENT:  grossly normal hearing, lips & tongue, mmm . Neck:  no LAD, masses or thyromegaly . Cardiovascular:  RRR, no m/r/g. 2+ LE edema.  Marland Kitchen Respiratory:   Mild scattered rhonchi.  Normal respiratory effort. . Abdomen:  soft, NT, ND, NABS . Skin:  Stasis dermatitis with superficial ulcerations but no obvious infection      . Musculoskeletal:  grossly normal tone BUE/BLE, good ROM, no bony abnormality . Psychiatric: hostile mood and affect on 2nd evaluation, speech fluent and appropriate . Neurologic:  Unable to perform either time    Radiological Exams on Admission: CT Head Wo Contrast  Result Date: 03/29/2019 CLINICAL DATA:  Altered mental status, unclear cause. Additional provided: Patient not responsive to staff. EXAM: CT HEAD WITHOUT CONTRAST TECHNIQUE: Contiguous axial images were obtained from the base of the skull through the vertex without intravenous contrast. COMPARISON:  Head CT 03/10/2019.  FINDINGS: Brain: There is no evidence of acute intracranial hemorrhage, intracranial mass, midline shift or extra-axial fluid collection.No demarcated cortical infarction. Unchanged ill-defined hypoattenuation within the cerebral white matter which is nonspecific, but consistent with chronic small vessel ischemic disease. Redemonstrated small chronic lacunar infarct within the right thalamus. Moderate generalized parenchymal atrophy. Vascular: No hyperdense vessel.  Atherosclerotic calcifications. Skull: Normal. Negative for fracture or focal lesion. Sinuses/Orbits: Visualized orbits demonstrate no acute abnormality. Mild scattered paranasal sinus mucosal thickening. No significant mastoid effusion at the imaged levels. IMPRESSION: No evidence of acute intracranial abnormality. Stable generalized parenchymal atrophy and chronic small vessel ischemic disease. Redemonstrated small chronic right thalamic lacunar infarct. Electronically Signed   By: Kellie Simmering DO   On: 03/29/2019 15:15   DG Chest Port 1 View  Result Date: 03/29/2019 CLINICAL DATA:  Hypoglycemia. EXAM: PORTABLE CHEST 1 VIEW COMPARISON:  Chest radiograph 03/11/2019 FINDINGS: Stable cardiomediastinal contours. A subtle opacity persists at the right lung base. The left lung is clear. No pneumothorax or pleural effusion. No pneumothorax or significant pleural effusion. Remote right clavicular fracture. IMPRESSION: No acute finding. Redemonstrated subtle opacity at the right lung base. Recommend radiographic follow-up to resolution or alternatively CT. Electronically Signed   By: Audie Pinto M.D.   On: 03/29/2019 15:04    EKG: Independently reviewed.  NSR with rate 70; nonspecific ST changes with no evidence of acute ischemia, NSCSLT  Labs on Admission: I have personally reviewed the available labs and imaging studies at the time of the admission.  Pertinent labs:   Glucose 26, 74, 98, 50, 52, 48, 145 K+ 2.6 AP 196 Albumin 2.1 BNP  673.3 CK 105 Lactate 2.8 WBC 11.7 Hgb 10.5 Platelets 134 INR 1.1 COVID pending ETOH negative   Assessment/Plan Principal Problem:   Hypoglycemia due to insulin Active Problems:   HTN (hypertension)   Hyperlipidemia   Tobacco abuse   Bipolar disorder (HCC)   Acute metabolic encephalopathy   Alcohol use disorder, severe, dependence (HCC)   History of hepatitis B virus infection conferring immunity   Seizure (HCC)    Recurrent hypoglycemia -Patient with prior admission for the same presenting with refractory hypoglycemia -At the time of my initial evaluation, he was obtunded and I was concerned that he may need ICU placement -He had been given D50 x 2 with D10 drip and was still having refractory hypoglycemia -However, he has since normalized with his glucose and is now eating in an unrestrained manner - 4 sandwiches and innumerable graham crackers and demanding "a bag of graham crackers" -Current plan is to monitor overnight on telemetry and likely d/c to home tomorrow once he has proven ongoing stability -I have discontinued his sulfonylurea and recommend that these medications not be prescribed for him in the future -Will hold Levemir for now and cover with sensitive-scale SSI  Acute metabolic encephalopathy -Resolved -Appears to have been related to persistent and severe hypoglycemia, although seizure is also a consideration -Hypothermia also is likely due to hypoglycemia and there is low current concern for sepsis so will not continue antibiotics at this time -Will trend lactate to ensure clearance but again this is not currently thought to have been due to sepsis  Seizure d/o -This has been attributed to hypoglycemia in the past and he is not on seizure medications  ETOH dependence -Patient with chronic ETOH dependence -ETOH level negative on presentation -He is at high risk for complications of withdrawal including seizures, DTs -Will observe -CIWA protocol -TOC  team consult for possible inpatient treatment  HTN -He does not appear to be taking medications for this issue at this time  HLD -Contine Pravachol -Hold Lovaza due to limited inpatient utility  Bipolar -He has both Abilify and Seroquel listed on his MAR - will continue Seroquel for now but hold Abilify -Continue Cymbalta -Continue hydroxyzine  Chronic pain -Buprenorphine was last prescribed in 12/2018 so will not continue -Continue Percocet -I have reviewed this patient in the [place] Controlled Substances Reporting System.  He is receiving medications from only one provider and appears to be taking them as prescribed. -He is not at particularly high risk of overdose but is at increased risk for opioid misuse or diversion.  Tobacco dependence -Encourage cessation.     -Patch ordered      Note: This patient has been tested and is pending for the novel coronavirus COVID-19.  DVT prophylaxis:   Lovenox   Code Status:  Full  Family Communication: None present Disposition Plan: He is anticipated to d/c to home without [facility] services tomorrow if his hypoglycemia has resolved. Consults called: TOC team Admission status:  It is my clinical opinion that referral for OBSERVATION is reasonable and necessary in this patient based on the above information provided. The aforementioned taken together are felt to place the patient at high risk for further clinical deterioration. However it is anticipated that the patient may be medically stable for  discharge from the hospital within 24 to 48 hours.   Karmen Bongo MD [facility]   How to contact the [facility] Attending or Consulting provider [place] or covering provider during after hours [place], for this patient?  1. Check the care team in [facility] and look for a) attending/consulting [facility] provider listed and b) the [facility] team listed 2. Log into [email or web address] and use [place]'s universal password to access. If you do not have the password, please  contact the hospital operator. 3. Locate the [facility] provider you are looking for under [facility] and page to a number that you can be directly reached. 4. If you still have difficulty reaching the provider, please page the [facility] (Director on Call) for the Hospitalists listed on amion for assistance.   03/29/2019, 3:52 PM

## 2019-03-29 NOTE — ED Notes (Signed)
The on call doctor for [facility] will disc order for d10

## 2019-03-29 NOTE — ED Notes (Signed)
Pt provided 3rd ED happy meal and 2nd [employer].

## 2019-03-29 NOTE — ED Notes (Signed)
Call made to admitting doctor about the pts d 10 drip  Rate or discontinue

## 2019-03-29 NOTE — ED Notes (Signed)
Pt insisting on goin through his poop covered pants that hre wore in  He wnats

## 2019-03-29 NOTE — ED Notes (Signed)
The pt is raving about he had money in his pants pocket that is gone now he is trying to get out of bed  A nurse from days that was here whenever he arrived reports no money in his pocket   Now hes angry he only wants his money

## 2019-03-29 NOTE — ED Notes (Signed)
Pt provided ED happy meal and cup of orange juice.

## 2019-03-29 NOTE — ED Notes (Signed)
Pt obtunded stool cleaned again  pts given dextrose 12.5 mg iv for cbg of 50    d10 increased to 148ml/hr

## 2019-03-29 NOTE — ED Notes (Addendum)
Admitting doctor came by to check him  He just requested graham crackers  A bag full

## 2019-03-30 DIAGNOSIS — R680 Hypothermia, not associated with low environmental temperature: Secondary | ICD-10-CM | POA: Diagnosis present

## 2019-03-30 DIAGNOSIS — T383X5A Adverse effect of insulin and oral hypoglycemic [antidiabetic] drugs, initial encounter: Secondary | ICD-10-CM | POA: Diagnosis present

## 2019-03-30 DIAGNOSIS — E785 Hyperlipidemia, unspecified: Secondary | ICD-10-CM | POA: Diagnosis present

## 2019-03-30 DIAGNOSIS — Z79899 Other long term (current) drug therapy: Secondary | ICD-10-CM | POA: Diagnosis not present

## 2019-03-30 DIAGNOSIS — D509 Iron deficiency anemia, unspecified: Secondary | ICD-10-CM | POA: Diagnosis present

## 2019-03-30 DIAGNOSIS — E11649 Type 2 diabetes mellitus with hypoglycemia without coma: Secondary | ICD-10-CM | POA: Diagnosis present

## 2019-03-30 DIAGNOSIS — G9341 Metabolic encephalopathy: Secondary | ICD-10-CM | POA: Diagnosis present

## 2019-03-30 DIAGNOSIS — Z794 Long term (current) use of insulin: Secondary | ICD-10-CM | POA: Diagnosis not present

## 2019-03-30 DIAGNOSIS — E876 Hypokalemia: Secondary | ICD-10-CM | POA: Diagnosis present

## 2019-03-30 DIAGNOSIS — K219 Gastro-esophageal reflux disease without esophagitis: Secondary | ICD-10-CM | POA: Diagnosis present

## 2019-03-30 DIAGNOSIS — F209 Schizophrenia, unspecified: Secondary | ICD-10-CM | POA: Diagnosis present

## 2019-03-30 DIAGNOSIS — Z833 Family history of diabetes mellitus: Secondary | ICD-10-CM | POA: Diagnosis not present

## 2019-03-30 DIAGNOSIS — F319 Bipolar disorder, unspecified: Secondary | ICD-10-CM | POA: Diagnosis present

## 2019-03-30 DIAGNOSIS — F419 Anxiety disorder, unspecified: Secondary | ICD-10-CM | POA: Diagnosis present

## 2019-03-30 DIAGNOSIS — F17210 Nicotine dependence, cigarettes, uncomplicated: Secondary | ICD-10-CM | POA: Diagnosis present

## 2019-03-30 DIAGNOSIS — E160 Drug-induced hypoglycemia without coma: Secondary | ICD-10-CM | POA: Diagnosis not present

## 2019-03-30 DIAGNOSIS — Z791 Long term (current) use of non-steroidal anti-inflammatories (NSAID): Secondary | ICD-10-CM | POA: Diagnosis not present

## 2019-03-30 DIAGNOSIS — F1020 Alcohol dependence, uncomplicated: Secondary | ICD-10-CM | POA: Diagnosis present

## 2019-03-30 DIAGNOSIS — E872 Acidosis: Secondary | ICD-10-CM | POA: Diagnosis present

## 2019-03-30 DIAGNOSIS — G40909 Epilepsy, unspecified, not intractable, without status epilepticus: Secondary | ICD-10-CM | POA: Diagnosis present

## 2019-03-30 DIAGNOSIS — B1910 Unspecified viral hepatitis B without hepatic coma: Secondary | ICD-10-CM | POA: Diagnosis present

## 2019-03-30 DIAGNOSIS — E1142 Type 2 diabetes mellitus with diabetic polyneuropathy: Secondary | ICD-10-CM | POA: Diagnosis present

## 2019-03-30 DIAGNOSIS — G8929 Other chronic pain: Secondary | ICD-10-CM | POA: Diagnosis present

## 2019-03-30 DIAGNOSIS — Z20822 Contact with and (suspected) exposure to covid-19: Secondary | ICD-10-CM | POA: Diagnosis present

## 2019-03-30 DIAGNOSIS — Z7982 Long term (current) use of aspirin: Secondary | ICD-10-CM | POA: Diagnosis not present

## 2019-03-30 LAB — BLOOD GAS, ARTERIAL
Acid-Base Excess: 3 mmol/L — ABNORMAL HIGH (ref 0.0–2.0)
Bicarbonate: 27 mmol/L (ref 20.0–28.0)
FIO2: 21
O2 Saturation: 97.2 %
Patient temperature: 37.5
pCO2 arterial: 41.8 mmHg (ref 32.0–48.0)
pH, Arterial: 7.428 (ref 7.350–7.450)
pO2, Arterial: 92.9 mmHg (ref 83.0–108.0)

## 2019-03-30 LAB — BASIC METABOLIC PANEL
Anion gap: 6 (ref 5–15)
BUN: 5 mg/dL — ABNORMAL LOW (ref 8–23)
CO2: 23 mmol/L (ref 22–32)
Calcium: 8.1 mg/dL — ABNORMAL LOW (ref 8.9–10.3)
Chloride: 115 mmol/L — ABNORMAL HIGH (ref 98–111)
Creatinine, Ser: 0.49 mg/dL — ABNORMAL LOW (ref 0.61–1.24)
GFR calc Af Amer: >60 mL/min (ref >60)
GFR calc non Af Amer: >60 mL/min (ref >60)
Glucose, Bld: 29 mg/dL — CL (ref 70–99)
Potassium: 3.4 mmol/L — ABNORMAL LOW (ref 3.5–5.1)
Sodium: 144 mmol/L (ref 135–145)

## 2019-03-30 LAB — GLUCOSE, CAPILLARY
Glucose-Capillary: 32 mg/dL — CL (ref 70–99)
Glucose-Capillary: 73 mg/dL (ref 70–99)
Glucose-Capillary: 95 mg/dL (ref 70–99)
Glucose-Capillary: 107 mg/dL — ABNORMAL HIGH (ref 70–99)
Glucose-Capillary: 54 mg/dL — ABNORMAL LOW (ref 70–99)
Glucose-Capillary: 58 mg/dL — ABNORMAL LOW (ref 70–99)
Glucose-Capillary: 48 mg/dL — ABNORMAL LOW (ref 70–99)
Glucose-Capillary: 180 mg/dL — ABNORMAL HIGH (ref 70–99)
Glucose-Capillary: 121 mg/dL — ABNORMAL HIGH (ref 70–99)
Glucose-Capillary: 105 mg/dL — ABNORMAL HIGH (ref 70–99)
Glucose-Capillary: 64 mg/dL — ABNORMAL LOW (ref 70–99)
Glucose-Capillary: 68 mg/dL — ABNORMAL LOW (ref 70–99)
Glucose-Capillary: 131 mg/dL — ABNORMAL HIGH (ref 70–99)
Glucose-Capillary: 114 mg/dL — ABNORMAL HIGH (ref 70–99)
Glucose-Capillary: 86 mg/dL (ref 70–99)
Glucose-Capillary: 270 mg/dL — ABNORMAL HIGH (ref 70–99)

## 2019-03-30 LAB — CBC
HCT: 28.9 % — ABNORMAL LOW (ref 39.0–52.0)
Hemoglobin: 9.6 g/dL — ABNORMAL LOW (ref 13.0–17.0)
MCH: 33.8 pg (ref 26.0–34.0)
MCHC: 33.2 g/dL (ref 30.0–36.0)
MCV: 101.8 fL — ABNORMAL HIGH (ref 80.0–100.0)
Platelets: 97 10*3/uL — ABNORMAL LOW (ref 150–400)
RBC: 2.84 MIL/uL — ABNORMAL LOW (ref 4.22–5.81)
RDW: 15.9 % — ABNORMAL HIGH (ref 11.5–15.5)
WBC: 8.6 10*3/uL (ref 4.0–10.5)
nRBC: 0 % (ref 0.0–0.2)

## 2019-03-30 LAB — LACTIC ACID, PLASMA: Lactic Acid, Venous: 2.2 mmol/L (ref 0.5–1.9)

## 2019-03-30 MED ORDER — DEXTROSE 50 % IV SOLN
INTRAVENOUS | Status: AC
  Administered 2019-03-30: 50 mL
  Filled 2019-03-30: qty 50

## 2019-03-30 MED ORDER — DEXTROSE 10 % IV SOLN: INTRAVENOUS | Status: DC

## 2019-03-30 MED ORDER — POTASSIUM CHLORIDE 2 MEQ/ML IV SOLN
INTRAVENOUS | Status: DC
  Filled 2019-03-30 (×7): qty 1000

## 2019-03-30 MED ORDER — DEXTROSE 50 % IV SOLN
INTRAVENOUS | Status: AC
Start: 2019-03-30 — End: 2019-03-30
  Administered 2019-03-30: 50 mL
  Filled 2019-03-30: qty 50

## 2019-03-30 MED ORDER — SODIUM CHLORIDE 0.9 % IV BOLUS
1000.0000 mL | Freq: Once | INTRAVENOUS | Status: AC
  Administered 2019-03-30: 06:00:00 1000 mL via INTRAVENOUS

## 2019-03-30 MED ORDER — DEXTROSE 50 % IV SOLN: 25.0000 g | INTRAVENOUS | Status: AC

## 2019-03-30 NOTE — Progress Notes (Signed)
PROGRESS NOTE    Michael [name]  [id number] DOB: 09-Jun-1955 DOA: 03/29/2019 PCP: Helane Rima, MD   Brief Narrative:  HPI: Michael [name] is a 64 y.o. male with medical history significant of ETOH dependence; tobacco dependence; Hepatitis B infection; DM; HTN; bipolar d/o; seizure d/o and recent hospitalizations x 2 presenting with hypoglycemia.  He opened eyes to voice/touch but was confused and unable to answer questions at the time of my evaluation.  He had fecal incontinence prior to my arrival.  When I went back into the room to reevaluate the patient, he was alert, awake, and sitting up in bed infuriated that "someone took" his money.  The third time I saw him it was at his request (the nurse paged me that he needed to see me); he then explained that despite a pile of empty graham cracker packages and now him eating his fourth sandwich while in the ER, he needs a "bag of graham crackers."  He was hospitalized from 2/3-4 for a seizure, requiring intubation for airway protection; he was extubated the next day and left AMA.  He returned from 2/15-24 for COPD exacerbation and aspiration.  Neurology was consulted for severe recurrent seizures and these were thought to be from hypoglycemia.   ED Course:  Hypoglycemia -> seizure. Given Glucagon, recheck.  Given D50 and started on D10.  Much more awake now, still confused.  Head CT negative.  Hypothermia, ?associated with hypoglycemia but given antibiotics.  Low K+.  Assessment & Plan:   Principal Problem:   Hypoglycemia due to insulin Active Problems:   HTN (hypertension)   Hyperlipidemia   Tobacco abuse   Bipolar disorder (HCC)   Acute metabolic encephalopathy   Alcohol use disorder, severe, dependence (HCC)   History of hepatitis B virus infection conferring immunity   Seizure ([place])   Recurrent hypoglycemia -Patient with prior admission for the same presenting with refractory hypoglycemia.  Had couple other episodes of  hypoglycemia despite of being on dextrose fluids.  Switch to normal saline 5% dextrose due to hypotension to treat both hypertension and hypoglycemia.  Still with borderline low blood sugar.  Monitor closely with every 2 hours blood sugar check.  Continue to hold Levemir.  Acute metabolic encephalopathy: Resolved.  Likely due to hypoglycemia.sepsis  Seizure d/o -This has been attributed to hypoglycemia in the past and he is not on seizure medications.  No witnessed seizures.  ETOH dependence -Patient with chronic ETOH dependence -ETOH level negative on presentation -He is at high risk for complications of withdrawal including seizures, DTs.  Continue CIWA protocol.  History of essential HTN but currently hypotensive/lactic acidosis: Does not appear to be on any medications for hypertension.  Here he remains hypotensive with elevated lactic acid, likely due to organ hypoperfusion.  Switch fluids from dextrose 10% to normal saline 5% dextrose and increase frequency.  Lactic acidosis could be due to alcoholism however he has chronically elevated lactic acid.  HLD -Contine Pravachol -Hold Lovaza due to limited inpatient utility  Bipolar -He has both Abilify and Seroquel listed on his MAR - will continue Seroquel for now but hold Abilify -Continue Cymbalta -Continue hydroxyzine  Chronic pain -Buprenorphine was last prescribed in 12/2018 so will not continue -Continue Percocet  Tobacco dependence -Encouraged cessation.     -Nicotine patch ordered.  Chronic microcytic anemia: Hemoglobin at baseline.  Recently his B12 and folate were checked on 03/11/2019 and folate was normal however B12 was elevated.  This is likely secondary to alcoholism.  Hypokalemia: 3.4.  Replace today and recheck in the morning.  DVT prophylaxis: Lovenox   Code Status: Full Code  Family Communication:  None present at bedside.  Plan of care discussed with patient in length and he verbalized understanding  and agreed with it. Patient is from: Home Disposition Plan: To be determined based on clinical course. Barriers to discharge: Persistent hypoglycemia and hypotension requiring close monitoring and IV fluids and dextrose fluids   Estimated body mass index is 21.08 kg/m as calculated from the following:   Height as of this encounter: 5\' 4"  (1.626 m).   Weight as of this encounter: 55.7 kg.      Nutritional status:               Consultants:   None  Procedures:   None  Antimicrobials:   None   Subjective: Seen and examined.  He was alert and oriented.  No complaints.  Objective: Vitals:   03/30/19 0700 03/30/19 0730 03/30/19 0754 03/30/19 1400  BP: 94/61 94/60 92/65  112/79  Pulse: 90 87 87 84  Resp: 16 20 13 17   Temp:   97.9 F (36.6 C) 98 F (36.7 C)  TempSrc:   Oral Oral  SpO2: 98% 99% 98% 100%  Weight:      Height:        Intake/Output Summary (Last 24 hours) at 03/30/2019 1453 Last data filed at 03/30/2019 1400 Gross per 24 hour  Intake 2557 ml  Output 600 ml  Net 1957 ml   Filed Weights   03/29/19 2300  Weight: 55.7 kg    Examination:  General exam: Appears calm and comfortable  Respiratory system: Clear to auscultation. Respiratory effort normal. Cardiovascular system: S1 & S2 heard, RRR. No JVD, murmurs, rubs, gallops or clicks.  Bilateral lower extremity pitting edema Gastrointestinal system: Abdomen is nondistended, soft and nontender. No organomegaly or masses felt. Normal bowel sounds heard. Central nervous system: Alert and oriented. No focal neurological deficits. Extremities: Symmetric 5 x 5 power. Skin: No rashes, lesions or ulcers Psychiatry: Judgement and insight appear poor. Mood & affect flat.   Data Reviewed: I have personally reviewed following labs and imaging studies  CBC: Recent Labs  Lab 03/29/19 1415 03/30/19 0538  WBC 11.7*  11.8* 8.6  NEUTROABS 8.8*  --   HGB 10.5*  10.5* 9.6*  HCT 31.8*  31.8* 28.9*    MCV 102.6*  102.9* 101.8*  PLT 134*  133* 97*   Basic Metabolic Panel: Recent Labs  Lab 03/29/19 1415 03/30/19 0538  NA 142 144  K 2.6* 3.4*  CL 107 115*  CO2 27 23  GLUCOSE 80 29*  BUN <5* 5*  CREATININE 0.45* 0.49*  CALCIUM 8.3* 8.1*  MG 2.1  --    GFR: Estimated Creatinine Clearance: 76.4 mL/min (A) (by C-G formula based on SCr of 0.49 mg/dL (L)). Liver Function Tests: Recent Labs  Lab 03/29/19 1415  AST 29  ALT 40  ALKPHOS 196*  BILITOT 0.5  PROT 4.8*  ALBUMIN 2.1*   No results for input(s): LIPASE, AMYLASE in the last 168 hours. Recent Labs  Lab 03/29/19 1445  AMMONIA 38*   Coagulation Profile: Recent Labs  Lab 03/29/19 1445  INR 1.1   Cardiac Enzymes: Recent Labs  Lab 03/29/19 1415  CKTOTAL 105   BNP (last 3 results) No results for input(s): PROBNP in the last 8760 hours. HbA1C: No results for input(s): HGBA1C in the last 72 hours. CBG: Recent Labs  Lab 03/30/19 [phone]  03/30/19 1008 03/30/19 1101 03/30/19 1232 03/30/19 1303  GLUCAP 180* 121* 105* 64* 68*   Lipid Profile: No results for input(s): CHOL, HDL, LDLCALC, TRIG, CHOLHDL, LDLDIRECT in the last 72 hours. Thyroid Function Tests: No results for input(s): TSH, T4TOTAL, FREET4, T3FREE, THYROIDAB in the last 72 hours. Anemia Panel: No results for input(s): VITAMINB12, FOLATE, FERRITIN, TIBC, IRON, RETICCTPCT in the last 72 hours. Sepsis Labs: Recent Labs  Lab 03/29/19 1445 03/30/19 0818  LATICACIDVEN 2.8* 2.2*    Recent Results (from the past 240 hour(s))  Culture, blood (routine x 2)     Status: None (Preliminary result)   Collection Time: 03/29/19  2:40 PM   Specimen: BLOOD  Result Value Ref Range Status   Specimen Description BLOOD LEFT ARM  Final   Special Requests   Final    BOTTLES DRAWN AEROBIC ONLY Blood Culture results may not be optimal due to an inadequate volume of blood received in culture bottles   Culture   Final    NO GROWTH < 24 HOURS Performed at [facility], [place] [address] [place], [place] [address]    Report Status PENDING  Incomplete  Culture, blood (routine x 2)     Status: None (Preliminary result)   Collection Time: 03/29/19  2:45 PM   Specimen: BLOOD  Result Value Ref Range Status   Specimen Description BLOOD RIGHT WRIST  Final   Special Requests   Final    BOTTLES DRAWN AEROBIC ONLY Blood Culture results may not be optimal due to an inadequate volume of blood received in culture bottles   Culture   Final    NO GROWTH < 24 HOURS Performed at [facility], [place] [address] [place], [place] [address]    Report Status PENDING  Incomplete  SARS CORONAVIRUS 2 (TAT 6-24 HRS) Nasopharyngeal Nasopharyngeal Swab     Status: None   Collection Time: 03/29/19  3:27 PM   Specimen: Nasopharyngeal Swab  Result Value Ref Range Status   SARS Coronavirus 2 NEGATIVE NEGATIVE Final    Comment: (NOTE) SARS-CoV-2 target nucleic acids are NOT DETECTED. The SARS-CoV-2 RNA is generally detectable in upper and lower respiratory specimens during the acute phase of infection. Negative results do not preclude SARS-CoV-2 infection, do not rule out co-infections with other pathogens, and should not be used as the sole basis for treatment or other patient management decisions. Negative results must be combined with clinical observations, patient history, and epidemiological information. The expected result is Negative. Fact Sheet for Patients: SugarRoll.be Fact Sheet for Healthcare Providers: https://www.woods-mathews.com/ This test is not yet approved or cleared by the [place] FDA and  has been authorized for detection and/or diagnosis of SARS-CoV-2 by FDA under an Emergency Use Authorization (EUA). This EUA will remain  in effect (meaning this test can be used) for the duration of the COVID-19 declaration under Section 56 4(b)(1) of the Act, 21 U.S.C. section 360bbb-3(b)(1), unless the  authorization is terminated or revoked sooner. Performed at [facility], [place] [address], [place], [place] [address]       Radiology Studies: CT Head Wo Contrast  Result Date: 03/29/2019 CLINICAL DATA:  Altered mental status, unclear cause. Additional provided: Patient not responsive to staff. EXAM: CT HEAD WITHOUT CONTRAST TECHNIQUE: Contiguous axial images were obtained from the base of the skull through the vertex without intravenous contrast. COMPARISON:  Head CT 03/10/2019. FINDINGS: Brain: There is no evidence of acute intracranial hemorrhage, intracranial mass, midline shift  or extra-axial fluid collection.No demarcated cortical infarction. Unchanged ill-defined hypoattenuation within the cerebral white matter which is nonspecific, but consistent with chronic small vessel ischemic disease. Redemonstrated small chronic lacunar infarct within the right thalamus. Moderate generalized parenchymal atrophy. Vascular: No hyperdense vessel.  Atherosclerotic calcifications. Skull: Normal. Negative for fracture or focal lesion. Sinuses/Orbits: Visualized orbits demonstrate no acute abnormality. Mild scattered paranasal sinus mucosal thickening. No significant mastoid effusion at the imaged levels. IMPRESSION: No evidence of acute intracranial abnormality. Stable generalized parenchymal atrophy and chronic small vessel ischemic disease. Redemonstrated small chronic right thalamic lacunar infarct. Electronically Signed   By: Kellie Simmering DO   On: 03/29/2019 15:15   DG Chest Port 1 View  Result Date: 03/29/2019 CLINICAL DATA:  Hypoglycemia. EXAM: PORTABLE CHEST 1 VIEW COMPARISON:  Chest radiograph 03/11/2019 FINDINGS: Stable cardiomediastinal contours. A subtle opacity persists at the right lung base. The left lung is clear. No pneumothorax or pleural effusion. No pneumothorax or significant pleural effusion. Remote right clavicular fracture. IMPRESSION: No acute finding. Redemonstrated subtle opacity  at the right lung base. Recommend radiographic follow-up to resolution or alternatively CT. Electronically Signed   By: Audie Pinto M.D.   On: 03/29/2019 15:04    Scheduled Meds: . docusate sodium  100 mg Oral BID  . DULoxetine  30 mg Oral Daily  . enoxaparin (LOVENOX) injection  40 mg Subcutaneous Q24H  . folic acid  1 mg Oral Daily  . gabapentin  300 mg Oral TID  . insulin aspart  0-9 Units Subcutaneous TID WC  . multivitamin with minerals  1 tablet Oral Daily  . nicotine  14 mg Transdermal Daily  . pantoprazole  40 mg Oral Daily  . pravastatin  10 mg Oral q1800  . QUEtiapine Fumarate  150 mg Oral QHS  . sodium chloride flush  3 mL Intravenous Q12H  . thiamine  100 mg Oral Daily   Or  . thiamine  100 mg Intravenous Daily   Continuous Infusions: . dextrose 5 % and 0.9% NaCl 1,000 mL with potassium chloride 40 mEq infusion 150 mL/hr at 03/30/19 0914     LOS: 0 days   Time spent: 35 minutes   Darliss Cheney, MD [facility]  03/30/2019, 2:53 PM   To contact the attending provider between 7A-7P or the covering provider during after hours 7P-7A, please log into the web site [email or web address].

## 2019-03-30 NOTE — Progress Notes (Signed)
Pt arrived to unit feeling weak but alert and oriented.  Blood glucose was 274.  Given pt 4 packs of graham crackers.  Heart monitor applied. Vitals stable.  Currently resting in bed. Will continue to monitor.  Lupita Dawn, RN

## 2019-03-30 NOTE — Significant Event (Addendum)
Rapid Response Event Note  Overview: Called d/t CBG-32,  BP-66/23, and decreased LOC Time Called: 0607 Arrival Time: 0609 Event Type: Hypotension, Neurologic, Other (Comment)(hypoglycemia)  Initial Focused Assessment: Pt laying in bed with eyes closed. Pt will awaken with repeated verbal and painful stimulation but goes back to sleep very quickly. Pt will move all extremities to commands. Pupils 2 and slugglish. Lungs clear and diminished t/o. Skin warm and dry. T-99.3, HR, 81, BP-75/39, RR-18, SpO2-99% on RA. Patient's CBG was 32 at 0530. He was treated with 1 amp D50 and repeat CBG was 95 at 0600.   Interventions: CBG-73-Additional amp D50 given-CBG up to 107 and pt more responsive. D10W gtt started at 50cc/hr 1 L NS bolus x1  ABG-7.42/41.8/92.9/27 IV team consult (pt only has 1 PIV) Plan of Care (if not transferred): Pt is more responsive after getting D50.  Monitor pt's response to NS bolus. Call RRT if further assistance needed.  Event Summary: Name of Physician Notified: Bodenheimer, NP at (PTA RRT)    at          [place], Carren Rang

## 2019-03-30 NOTE — Progress Notes (Signed)
BG 48 after eating and drinking. Followed hypoglycemia protocol. BG 180 after D50 given.  MD notified. Will continue to monitor.

## 2019-03-31 LAB — COMPREHENSIVE METABOLIC PANEL
ALT: 33 U/L (ref 0–44)
AST: 33 U/L (ref 15–41)
Albumin: 1.8 g/dL — ABNORMAL LOW (ref 3.5–5.0)
Alkaline Phosphatase: 195 U/L — ABNORMAL HIGH (ref 38–126)
Anion gap: 5 (ref 5–15)
BUN: 5 mg/dL — ABNORMAL LOW (ref 8–23)
CO2: 27 mmol/L (ref 22–32)
Calcium: 8 mg/dL — ABNORMAL LOW (ref 8.9–10.3)
Chloride: 104 mmol/L (ref 98–111)
Creatinine, Ser: 0.64 mg/dL (ref 0.61–1.24)
GFR calc Af Amer: >60 mL/min (ref >60)
GFR calc non Af Amer: >60 mL/min (ref >60)
Glucose, Bld: 289 mg/dL — ABNORMAL HIGH (ref 70–99)
Potassium: 4.9 mmol/L (ref 3.5–5.1)
Sodium: 136 mmol/L (ref 135–145)
Total Bilirubin: 0.6 mg/dL (ref 0.3–1.2)
Total Protein: 4.6 g/dL — ABNORMAL LOW (ref 6.5–8.1)

## 2019-03-31 LAB — GLUCOSE, CAPILLARY
Glucose-Capillary: 26 mg/dL — CL (ref 70–99)
Glucose-Capillary: 274 mg/dL — ABNORMAL HIGH (ref 70–99)
Glucose-Capillary: 291 mg/dL — ABNORMAL HIGH (ref 70–99)
Glucose-Capillary: 287 mg/dL — ABNORMAL HIGH (ref 70–99)
Glucose-Capillary: 289 mg/dL — ABNORMAL HIGH (ref 70–99)

## 2019-03-31 LAB — CBC
HCT: 34.7 % — ABNORMAL LOW (ref 39.0–52.0)
Hemoglobin: 11.6 g/dL — ABNORMAL LOW (ref 13.0–17.0)
MCH: 33.9 pg (ref 26.0–34.0)
MCHC: 33.4 g/dL (ref 30.0–36.0)
MCV: 101.5 fL — ABNORMAL HIGH (ref 80.0–100.0)
Platelets: 110 10*3/uL — ABNORMAL LOW (ref 150–400)
RBC: 3.42 MIL/uL — ABNORMAL LOW (ref 4.22–5.81)
RDW: 15.5 % (ref 11.5–15.5)
WBC: 6.1 10*3/uL (ref 4.0–10.5)
nRBC: 0 % (ref 0.0–0.2)

## 2019-03-31 MED ORDER — KCL IN DEXTROSE-NACL 40-5-0.9 MEQ/L-%-% IV SOLN
INTRAVENOUS | Status: DC
  Filled 2019-03-31 (×2): qty 1000

## 2019-03-31 NOTE — Progress Notes (Signed)
Pt provided discharge instructions and education. Pt iv's removed and intact. Pt denies complaints. Vitals stable. Pt has all belongings. Pt tx via wheelchair to meet ride. Jerald Kief, RN

## 2019-03-31 NOTE — Discharge Instructions (Signed)
Hypoglycemia Hypoglycemia is when the sugar (glucose) level in your blood is too low. Signs of low blood sugar may include:  Feeling: ? Hungry. ? Worried or nervous (anxious). ? Sweaty and clammy. ? Confused. ? Dizzy. ? Sleepy. ? Sick to your stomach (nauseous).  Having: ? A fast heartbeat. ? A headache. ? A change in your vision. ? Tingling or no feeling (numbness) around your mouth, lips, or tongue. ? Jerky movements that you cannot control (seizure).  Having trouble with: ? Moving (coordination). ? Sleeping. ? Passing out (fainting). ? Getting upset easily (irritability). Low blood sugar can happen to people who have diabetes and people who do not have diabetes. Low blood sugar can happen quickly, and it can be an emergency. Treating low blood sugar Low blood sugar is often treated by eating or drinking something sugary right away, such as:  Fruit juice, 4-6 oz (120-150 mL).  Regular soda (not diet soda), 4-6 oz (120-150 mL).  Low-fat milk, 4 oz (120 mL).  Several pieces of hard candy.  Sugar or honey, 1 Tbsp (15 mL). Treating low blood sugar if you have diabetes If you can think clearly and swallow safely, follow the 15:15 rule:  Take 15 grams of a fast-acting carb (carbohydrate). Talk with your doctor about how much you should take.  Always keep a source of fast-acting carb with you, such as: ? Sugar tablets (glucose pills). Take 3-4 pills. ? 6-8 pieces of hard candy. ? 4-6 oz (120-150 mL) of fruit juice. ? 4-6 oz (120-150 mL) of regular (not diet) soda. ? 1 Tbsp (15 mL) honey or sugar.  Check your blood sugar 15 minutes after you take the carb.  If your blood sugar is still at or below 70 mg/dL (3.9 mmol/L), take 15 grams of a carb again.  If your blood sugar does not go above 70 mg/dL (3.9 mmol/L) after 3 tries, get help right away.  After your blood sugar goes back to normal, eat a meal or a snack within 1 hour.  Treating very low blood sugar If your  blood sugar is at or below 54 mg/dL (3 mmol/L), you have very low blood sugar (severe hypoglycemia). This may also cause:  Passing out.  Jerky movements you cannot control (seizure).  Losing consciousness (coma). This is an emergency. Do not wait to see if the symptoms will go away. Get medical help right away. Call your local emergency services (911 in the [place] Do not drive yourself to the hospital. If you have very low blood sugar and you cannot eat or drink, you may need a glucagon shot (injection). A family member or friend should learn how to check your blood sugar and how to give you a glucagon shot. Ask your doctor if you need to have a glucagon shot kit at home. Follow these instructions at home: General instructions  Take over-the-counter and prescription medicines only as told by your doctor.  Stay aware of your blood sugar as told by your doctor.  Limit alcohol intake to no more than 1 drink a day for nonpregnant women and 2 drinks a day for men. One drink equals 12 oz of beer (355 mL), 5 oz of wine (148 mL), or 1 oz of hard liquor (44 mL).  Keep all follow-up visits as told by your doctor. This is important. If you have diabetes:   Follow your diabetes care plan as told by your doctor. Make sure you: ? Know the signs of low blood sugar. ?  Take your medicines as told. ? Follow your exercise and meal plan. ? Eat on time. Do not skip meals. ? Check your blood sugar as often as told by your doctor. Always check it before and after exercise. ? Follow your sick day plan when you cannot eat or drink normally. Make this plan ahead of time with your doctor.  Share your diabetes care plan with: ? Your work or school. ? People you live with.  Check your pee (urine) for ketones: ? When you are sick. ? As told by your doctor.  Carry a card or wear jewelry that says you have diabetes. Contact a doctor if:  You have trouble keeping your blood sugar in your target  range.  You have low blood sugar often. Get help right away if:  You still have symptoms after you eat or drink something sugary.  Your blood sugar is at or below 54 mg/dL (3 mmol/L).  You have jerky movements that you cannot control.  You pass out. These symptoms may be an emergency. Do not wait to see if the symptoms will go away. Get medical help right away. Call your local emergency services (911 in the [place] Do not drive yourself to the hospital. Summary  Hypoglycemia happens when the level of sugar (glucose) in your blood is too low.  Low blood sugar can happen to people who have diabetes and people who do not have diabetes. Low blood sugar can happen quickly, and it can be an emergency.  Make sure you know the signs of low blood sugar and know how to treat it.  Always keep a source of sugar (fast-acting carb) with you to treat low blood sugar. This information is not intended to replace advice given to you by your health care provider. Make sure you discuss any questions you have with your health care provider. Document Revised: 05/02/2018 Document Reviewed: 02/12/2015 Elsevier Patient Education  2020 [employer]  

## 2019-03-31 NOTE — Discharge Summary (Signed)
Physician Discharge Summary  Michael [name] IRW:[id number] DOB: 01/26/1955 DOA: 03/29/2019  PCP: Helane Rima, MD  Admit date: 03/29/2019 Discharge date: 03/31/2019  Admitted From: Home Disposition: Home  Recommendations for Outpatient Follow-up:  1. Follow up with PCP in 1-2 weeks 2. Please obtain BMP/CBC in one week 3. Please follow up on the following pending results:  Home Health: None Equipment/Devices: None  Discharge Condition: Stable CODE STATUS: Full code Diet recommendation: Diabetic  Subjective: Patient seen and examined.  He has no complaint.  He is alert and oriented.  He wants to go home as soon as possible.  I offered him to be seen by PT OT however he prefers not to and prefers to go home.  PYP:PJKDTO Michael Brownis a 65 y.o.malewith medical history significant ofETOH dependence; tobacco dependence; Hepatitis B infection; DM; HTN; bipolar d/o; seizure d/o and recent hospitalizations x 2 presenting with hypoglycemia.He opened eyes to voice/touch but was confused and unable to answer questions at the time of my evaluation. He had fecal incontinence prior to my arrival. When I went back into the room to reevaluate the patient, he was alert, awake, and sitting up in bed infuriated that "someone took" his money.The third time I saw him it was at his request (the nurse paged me that he needed to see me); he then explained that despite a pile of empty graham cracker packages and now him eating his fourth sandwich while in the ER, he needs a "bag of graham crackers."  He was hospitalized from 2/3-4 for a seizure, requiring intubation for airway protection; he was extubated the next day and left AMA. He returned from 2/15-24 for COPD exacerbation and aspiration. Neurology was consulted for severe recurrent seizures and these were thought to be from hypoglycemia.   ED Course:Hypoglycemia ->seizure. Given Glucagon, recheck. Given D50 and started on D10. Much more awake  now, still confused. Head CT negative. Hypothermia, ?associated with hypoglycemia but given antibiotics. Low K+.  Brief/Interim Summary: Patient was admitted under hospital service due to acute metabolic encephalopathy which was secondary to acute hypoglycemia.  Patient has history of recurrent hypoglycemia.  He was started on dextrose fluids.  Despite of that, he continued to have hypoglycemia.  He then also had lactic acidosis and it was found out that this is chronic for him.  He then had hypotension for which he received fluids.  He also has EtOH dependence.  Patient has remained alert and oriented since yesterday.  His blood pressure has remained stable and within normal range since yesterday and his blood sugar has also remained stable and elevated while off of dextrose fluids.  Patient's recent hemoglobin A1c just last month was 14.  This indicates uncontrolled type 2 diabetes mellitus with hyperglycemia at baseline however he came in with hypoglycemia.  This could be due to his lack of properly needed oral intake due to EtOH dependence or possibly taking too many medications.  This is a challenging situation.  At this point in time, I am discharging him on all his home medications but glipizide which is being discontinued.  He is being discharged in stable condition.  Discharge Diagnoses:  Principal Problem:   Hypoglycemia due to insulin Active Problems:   HTN (hypertension)   Hyperlipidemia   Tobacco abuse   Bipolar disorder (HCC)   Acute metabolic encephalopathy   Alcohol use disorder, severe, dependence (HCC)   History of hepatitis B virus infection conferring immunity   Seizure [facility])    Discharge Instructions  Discharge Instructions    Discharge patient   Complete by: As directed    Discharge disposition: 01-Home or Self Care   Discharge patient date: 03/31/2019     Allergies as of 03/31/2019      Reactions   Ace Inhibitors Swelling, Other (See Comments)   Angioedema -  unquestionable   Losartan Swelling, Other (See Comments)   Pt presented with unquestionable angioedema on ACEIs (Angiotensin-converting enzyme (ACE) inhibitors) so aviod ARBs (Angiotensin receptor blockers), as well   Tylenol [acetaminophen] Other (See Comments)   "cannot have this" (takes Percocet at home)      Medication List    STOP taking these medications   glipiZIDE 5 MG tablet Commonly known as: GLUCOTROL     TAKE these medications   Accu-Chek Aviva Plus w/Device Kit 1 kit by Does not apply route every morning. diag code E11.42. Insulin dependent   Accu-Chek Aviva Soln 1 drop by In Vitro route as needed. diag code E08.10 insulin dependent. Use as device directs   Accu-Chek Softclix Lancets lancets Use 3 times daily to check blood sugar. diag code E11.42. Insulin dependent   albuterol 108 (90 Base) MCG/ACT inhaler Commonly known as: VENTOLIN HFA Inhale 1 puff into the lungs every 4 (four) hours as needed for wheezing or shortness of breath.   ammonium lactate 12 % cream Commonly known as: Lac-Hydrin Apply topically as needed for dry skin.   amoxicillin-clavulanate 875-125 MG tablet Commonly known as: AUGMENTIN Take 1 tablet by mouth every 12 (twelve) hours.   ARIPiprazole 2 MG tablet Commonly known as: ABILIFY Take 2 mg by mouth at bedtime.   aspirin EC 81 MG tablet Take by mouth.   azithromycin 250 MG tablet Commonly known as: ZITHROMAX Take 250-500 mg by mouth See admin instructions. Take 2 tablets (500 mg) by mouth 1st day, then take 1 tablet (250 mg) daily on days 2-5   B-D SINGLE USE SWABS REGULAR Pads Patient uses 6 times a day. Patient tests 3 times per day and administers insulin 3 times a day. ICD 10 code E11.42   Belbuca 900 MCG Film Generic drug: Buprenorphine HCl Place 900 mcg inside cheek every 12 (twelve) hours.   diclofenac sodium 1 % Gel Commonly known as: VOLTAREN Apply 2 g topically 4 (four) times daily -  before meals and at bedtime.    DULoxetine 30 MG capsule Commonly known as: CYMBALTA Take 30 mg by mouth daily.   feeding supplement (GLUCERNA SHAKE) Liqd Take 237 mLs by mouth 3 (three) times daily between meals.   folic acid 1 MG tablet Commonly known as: FOLVITE Take 1 tablet (1 mg total) by mouth daily.   gabapentin 300 MG capsule Commonly known as: NEURONTIN TAKE 3 CAPSULES BY MOUTH  EVERY MORNING AND  AFTERNOON. TAKE 4 CAPSULES  BY MOUTH AT NIGHT BEFORE  BED What changed:   how much to take  how to take this  when to take this  additional instructions   glucose blood test strip Commonly known as: Accu-Chek Aviva Plus Use 3 times daily to check blood sugar. diag code E11.42. Insulin dependent   hydrOXYzine 25 MG tablet Commonly known as: ATARAX/VISTARIL Take 25 mg by mouth 2 (two) times daily as needed for anxiety.   ibuprofen 800 MG tablet Commonly known as: ADVIL Take 800 mg by mouth 3 (three) times daily as needed for mild pain.   insulin aspart 100 UNIT/ML FlexPen Commonly known as: NOVOLOG Take 5 units before breakfast,   before  lunch, and 5 units before dinner (5 units before each meal) What changed:   how much to take  how to take this  when to take this  additional instructions   Levemir FlexTouch 100 UNIT/ML FlexPen Generic drug: insulin detemir 4 units Twice a day What changed:   how much to take  how to take this  when to take this  additional instructions   lipase/protease/amylase 36000 UNITS Cpep capsule Commonly known as: Creon TAKE 1 CAPSULE BY MOUTH 3  TIMES DAILY BEFORE MEALS   multivitamin with minerals Tabs tablet Take 1 tablet by mouth daily.   Narcan 4 MG/0.1ML Liqd nasal spray kit Generic drug: naloxone as needed.   omega-3 acid ethyl esters 1 g capsule Commonly known as: LOVAZA Take 1 capsule (1 g total) by mouth daily.   oxyCODONE-acetaminophen 10-325 MG tablet Commonly known as: PERCOCET Take 1 tablet by mouth every 8 (eight) hours as  needed for pain.   pantoprazole 40 MG tablet Commonly known as: PROTONIX Take 1 tablet (40 mg total) by mouth daily.   pravastatin 10 MG tablet Commonly known as: PRAVACHOL Take 1 tablet (10 mg total) by mouth daily at 6 PM.   QUEtiapine Fumarate 150 MG 24 hr tablet Commonly known as: SEROQUEL XR Take 150 mg by mouth at bedtime.   thiamine 100 MG tablet Commonly known as: VITAMIN B-1 Take 1 tablet (100 mg total) by mouth daily.   Vitamin D (Ergocalciferol) 1.25 MG (50000 UNIT) Caps capsule Commonly known as: DRISDOL Take 50,000 Units by mouth once a week.      Follow-up Information    Helane Rima, MD Follow up in 1 week(s).   Specialty: Family Medicine Contact information: [place] [id number] [phone]          Allergies  Allergen Reactions  . Ace Inhibitors Swelling and Other (See Comments)    Angioedema - unquestionable  . Losartan Swelling and Other (See Comments)    Pt presented with unquestionable angioedema on ACEIs (Angiotensin-converting enzyme (ACE) inhibitors) so aviod ARBs (Angiotensin receptor blockers), as well  . Tylenol [Acetaminophen] Other (See Comments)    "cannot have this" (takes Percocet at home)    Consultations: None   Procedures/Studies: CT Head Wo Contrast  Result Date: 03/29/2019 CLINICAL DATA:  Altered mental status, unclear cause. Additional provided: Patient not responsive to staff. EXAM: CT HEAD WITHOUT CONTRAST TECHNIQUE: Contiguous axial images were obtained from the base of the skull through the vertex without intravenous contrast. COMPARISON:  Head CT 03/10/2019. FINDINGS: Brain: There is no evidence of acute intracranial hemorrhage, intracranial mass, midline shift or extra-axial fluid collection.No demarcated cortical infarction. Unchanged ill-defined hypoattenuation within the cerebral white matter which is nonspecific, but consistent with chronic small vessel ischemic disease.  Redemonstrated small chronic lacunar infarct within the right thalamus. Moderate generalized parenchymal atrophy. Vascular: No hyperdense vessel.  Atherosclerotic calcifications. Skull: Normal. Negative for fracture or focal lesion. Sinuses/Orbits: Visualized orbits demonstrate no acute abnormality. Mild scattered paranasal sinus mucosal thickening. No significant mastoid effusion at the imaged levels. IMPRESSION: No evidence of acute intracranial abnormality. Stable generalized parenchymal atrophy and chronic small vessel ischemic disease. Redemonstrated small chronic right thalamic lacunar infarct. Electronically Signed   By: Kellie Simmering DO   On: 03/29/2019 15:15   CT Head Wo Contrast  Result Date: 03/10/2019 CLINICAL DATA:  Possible seizure today. EXAM: CT HEAD WITHOUT CONTRAST TECHNIQUE: Contiguous axial images were obtained from the base of the skull  through the vertex without intravenous contrast. COMPARISON:  Head CT scan 02/26/2019.  Brain MRI 03/26/2017. FINDINGS: Brain: No evidence of acute infarction, hemorrhage, hydrocephalus, extra-axial collection or mass lesion/mass effect. Atrophy and chronic microvascular ischemic change noted. Vascular: Atherosclerosis. Skull: Intact.  No focal lesion. Sinuses/Orbits: Thickening of the walls of the left maxillary sinus consistent with chronic sinus disease noted. The patient is status post ethmoidectomy. No mucosal thickening identified. Other: None. IMPRESSION: No acute abnormality. Atrophy and chronic microvascular ischemic change. Electronically Signed   By: Inge Rise M.D.   On: 03/10/2019 15:19   DG Chest Port 1 View  Result Date: 03/29/2019 CLINICAL DATA:  Hypoglycemia. EXAM: PORTABLE CHEST 1 VIEW COMPARISON:  Chest radiograph 03/11/2019 FINDINGS: Stable cardiomediastinal contours. A subtle opacity persists at the right lung base. The left lung is clear. No pneumothorax or pleural effusion. No pneumothorax or significant pleural effusion. Remote  right clavicular fracture. IMPRESSION: No acute finding. Redemonstrated subtle opacity at the right lung base. Recommend radiographic follow-up to resolution or alternatively CT. Electronically Signed   By: Audie Pinto M.D.   On: 03/29/2019 15:04   DG Chest Port 1 View  Result Date: 03/11/2019 CLINICAL DATA:  Dyspnea EXAM: PORTABLE CHEST 1 VIEW COMPARISON:  03/10/2019 FINDINGS: The heart size and mediastinal contours are within normal limits. Persistent right lower lobe airspace opacity, not significantly changed from the previous exam. Left lung is clear. No pleural effusion or pneumothorax. Posttraumatic deformity of the distal right clavicle, unchanged. IMPRESSION: Persistent right lower lobe airspace opacity, not significantly changed from previous exam. Electronically Signed   By: Davina Poke D.O.   On: 03/11/2019 13:46   DG Chest Port 1 View  Result Date: 03/10/2019 CLINICAL DATA:  Altered mental status, seizure, possible aspiration. EXAM: PORTABLE CHEST 1 VIEW COMPARISON:  Chest radiograph 02/27/2019 FINDINGS: Heart size within normal limits. Airspace disease within the right lung base persists, although has improved since prior examination 02/27/2019. The left lung remains clear. No evidence of pleural effusion or pneumothorax. Redemonstrated displaced distal right clavicle fracture. Thoracic spondylosis. IMPRESSION: Persistent although improved airspace disease within the right lung base as compared to chest radiograph 02/27/2019. The left lung remains clear. Redemonstrated displaced distal right clavicle fracture. Electronically Signed   By: Kellie Simmering DO   On: 03/10/2019 14:52   DG Shoulder Left  Result Date: 03/16/2019 CLINICAL DATA:  64 year old male with shoulder pain after fall. EXAM: LEFT SHOULDER - 2+ VIEW COMPARISON:  10/09/2014. FINDINGS: Bone mineralization remains normal. No glenohumeral joint dislocation. Proximal left humerus, scapula and visible clavicle appear stable  and intact. Negative visible left ribs and lung parenchyma. IMPRESSION: Negative. Electronically Signed   By: Genevie Ann M.D.   On: 03/16/2019 17:36   DG Swallowing Func-Speech Pathology  Result Date: 03/14/2019 Objective Swallowing Evaluation: Type of Study: MBS-Modified Barium Swallow Study  Patient Details Name: HORACE LUKAS MRN: [id number] Date of Birth: 02-Dec-1955 Today's Date: 03/14/2019 Time: SLP Start Time (ACUTE ONLY): 0915 -SLP Stop Time (ACUTE ONLY): 0935 SLP Time Calculation (min) (ACUTE ONLY): 20 min Past Medical History: Past Medical History: Diagnosis Date . Anxiety  . Arthritis   "joints; shoulders; feet" (06/08/2016) . Bipolar disorder ([place])  . Daily headache   "7:30 - 8:00 q night" (06/08/2016) . Diabetic peripheral neuropathy ([place])  . GERD (gastroesophageal reflux disease)  . History of hepatitis B virus infection conferring immunity 03/22/2018 . Pancreatitis  . Schizophrenia ([place])  . Seizures ([place]) 01/2016 X 2 . Type II diabetes mellitus ([place])  Past Surgical History: Past Surgical History: Procedure Laterality Date . BALLOON DILATION  03/22/2011  Procedure: BALLOON DILATION;  Surgeon: Gatha Mayer, MD;  Location: [facility];  Service: Endoscopy;  Laterality: N/A; . COLONOSCOPY N/A 05/22/2012  Procedure: COLONOSCOPY;  Surgeon: Gatha Mayer, MD;  Location: [facility];  Service: Endoscopy;  Laterality: N/A; . COLONOSCOPY W/ BIOPSIES AND POLYPECTOMY  09/12/11 . ESOPHAGOGASTRODUODENOSCOPY  03/22/2011  Procedure: ESOPHAGOGASTRODUODENOSCOPY (EGD);  Surgeon: Gatha Mayer, MD;  Location: [facility];  Service: Endoscopy;  Laterality: N/A;  egd with balloon  . EUS  04/20/2011  Procedure: UPPER ENDOSCOPIC ULTRASOUND (EUS) LINEAR;  Surgeon: Milus Banister, MD;  Location: [facility];  Service: Endoscopy;  Laterality: N/A; . KNEE ARTHROSCOPY Left  HPI: Pt is a 64 y/o male admitted secondary to acute respiratory distress due to acute COPD exacerbation and sepsis. A&P notable for aspiration PNA. He is on IV  Zosyn. CT of head negative for acute abnormality. PMH includes GERD, DM, alcohol abuse, tobacco use, HTN, hepatitis B, bipolar disorder, and pancreatitis. Pt hospitalized with recent seizures requiring intubation about 2 weeks ago. Chest x-ray of 2/16: Persistent right lower lobe airspace opacity.  No data recorded Assessment / Plan / Recommendation CHL IP CLINICAL IMPRESSIONS 03/14/2019 Clinical Impression Pt was seen in radiology suite for modified barium swallow study. He was seated upright in swallow chair for the study which was conducted in the lateral view. Trials of puree, mechanical soft solids, regular texture solids, thin liquids, nectar thick liquids and honey thick liquids were administered. He presents with moderate oropharyngeal phase dysphagia characterized by impaired bolus control, a pharyngeal delay, penetration and aspiration. He exhibited premature spillage to the valleculae and pyriform sinuses accross consistencies, penetration with honey thick liquids (PAS 2), with nectar thick liquids (PAS 3), and aspiration (PAS 7,8) with dysphagia 2 solids, thin liquids and nectar thick liquids. Pt demonstrated impulsive tendencies during the study and was not responsive to cues to reduce bolus sizes. With bolus sizes reduced to 5cc thin liquids, penetration (PAS 3) was still noted. No functional benefit was noted with reduced bolus sizes of solids or with a chin tuck posture. Coughing was inconsistently noted following episodes of aspiration but it was unsuccessful in expelling the aspirant. A dysphagia 1 diet with honey thick liquids is recommended at this time. Pt may have ice chips following oral care. SLP will follow for dysphagia treatment.  SLP Visit Diagnosis Dysphagia, oropharyngeal phase (R13.12) Attention and concentration deficit following -- Frontal lobe and executive function deficit following -- Impact on safety and function Moderate aspiration risk   CHL IP TREATMENT RECOMMENDATION 03/14/2019  Treatment Recommendations Therapy as outlined in treatment plan below   Prognosis 03/14/2019 Prognosis for Safe Diet Advancement Fair Barriers to Reach Goals Cognitive deficits;Motivation Barriers/Prognosis Comment -- [facility] IP DIET RECOMMENDATION 03/14/2019 SLP Diet Recommendations Dysphagia 1 (Puree) solids;Ice chips PRN after oral care;Honey thick liquids Liquid Administration via No straw;Cup Medication Administration Crushed with puree Compensations Minimize environmental distractions;Slow rate;Small sips/bites Postural Changes Remain semi-upright after after feeds/meals (Comment)   CHL IP OTHER RECOMMENDATIONS 03/14/2019 Recommended Consults -- Oral Care Recommendations Oral care BID Other Recommendations Order thickener from pharmacy   CHL IP FOLLOW UP RECOMMENDATIONS 03/14/2019 Follow up Recommendations 24 hour supervision/assistance;Skilled Nursing facility   [facility] IP FREQUENCY AND DURATION 03/14/2019 Speech Therapy Frequency (ACUTE ONLY) min 2x/week Treatment Duration 2 weeks      CHL IP ORAL PHASE 03/14/2019 Oral Phase Impaired Oral - Pudding Teaspoon -- Oral - Pudding Cup --  Oral - Honey Teaspoon -- Oral - Honey Cup WFL Oral - Nectar Teaspoon -- Oral - Nectar Cup Premature spillage Oral - Nectar Straw -- Oral - Thin Teaspoon -- Oral - Thin Cup Premature spillage Oral - Thin Straw -- Oral - Puree Premature spillage Oral - Mech Soft -- Oral - Regular Premature spillage Oral - Multi-Consistency -- Oral - Pill -- Oral Phase - Comment --  CHL IP PHARYNGEAL PHASE 03/14/2019 Pharyngeal Phase Impaired Pharyngeal- Pudding Teaspoon -- Pharyngeal -- Pharyngeal- Pudding Cup -- Pharyngeal -- Pharyngeal- Honey Teaspoon -- Pharyngeal -- Pharyngeal- Honey Cup Delayed swallow initiation-vallecula;Penetration/Aspiration during swallow Pharyngeal Material enters airway, remains ABOVE vocal cords then ejected out Pharyngeal- Nectar Teaspoon -- Pharyngeal -- Pharyngeal- Nectar Cup Penetration/Aspiration during swallow;Delayed swallow  initiation-pyriform sinuses Pharyngeal Material enters airway, passes BELOW cords and not ejected out despite cough attempt by patient Pharyngeal- Nectar Straw -- Pharyngeal -- Pharyngeal- Thin Teaspoon -- Pharyngeal -- Pharyngeal- Thin Cup Penetration/Aspiration during swallow;Delayed swallow initiation-pyriform sinuses;Compensatory strategies attempted (with notebox) Pharyngeal Material enters airway, passes BELOW cords and not ejected out despite cough attempt by patient Pharyngeal- Thin Straw -- Pharyngeal -- Pharyngeal- Puree Delayed swallow initiation-vallecula Pharyngeal -- Pharyngeal- Mechanical Soft Penetration/Aspiration during swallow;Delayed swallow initiation-pyriform sinuses Pharyngeal Material enters airway, passes BELOW cords without attempt by patient to eject out (silent aspiration) Pharyngeal- Regular WFL Pharyngeal -- Pharyngeal- Multi-consistency -- Pharyngeal -- Pharyngeal- Pill -- Pharyngeal -- Pharyngeal Comment --  CHL IP CERVICAL ESOPHAGEAL PHASE 03/14/2019 Cervical Esophageal Phase WFL Pudding Teaspoon -- Pudding Cup -- Honey Teaspoon -- Honey Cup -- Nectar Teaspoon -- Nectar Cup -- Nectar Straw -- Thin Teaspoon -- Thin Cup -- Thin Straw -- Puree -- Mechanical Soft -- Regular -- Multi-consistency -- Pill -- Cervical Esophageal Comment -- Shanika I. Hardin Negus, [place], [place] Office number [phone] Pager [phone] Horton Marshall 03/14/2019, 11:27 AM              EEG adult  Result Date: 03/11/2019 Lora Havens, MD     03/11/2019  4:30 PM Patient Name: REMY VOILES MRN: [id number] Epilepsy Attending: Lora Havens Referring Physician/Provider: Dr Vernell Leep Date: 03/11/2019 Duration: 23.31 mins Patient history: 64 year old with history of alcohol abuse,  bipolar disorder,  recent seizures who presents to the hospital after having witnessed seizure at home. EEG to evaluate for seizure. Level of alertness: awake AEDs during EEG study: None  Technical aspects: This EEG study was done with scalp electrodes positioned according to the 10-20 International system of electrode placement. Electrical activity was acquired at a sampling rate of _0  and reviewed with a high frequency filter of _1  and a low frequency filter of _2 . EEG data were recorded continuously and digitally stored. DESCRIPTION: No clear posterior dominant rhythm was seen. EEG showed continuous generalized 3-_3  theta-delta slowing. Hyperventilation and photic stimulation were not performed. ABNORMALITY - Continuous slow, generalized IMPRESSION: This study is suggestive of moderate diffuse encephalopathy, non specific to etiology. No seizures or epileptiform discharges were seen throughout the recording. Lora Havens   ECHOCARDIOGRAM COMPLETE  Result Date: 03/13/2019    ECHOCARDIOGRAM REPORT   Patient Name:   SEATON HOFMANN Date of Exam: 03/13/2019 Medical Rec #:  [id number]      Height:       64.0 in Accession #:    [id number]     Weight:       105.6 lb Date of Birth:  1956-01-14      BSA:  1.49 m Patient Age:    89 years       BP:           107/77 mmHg Patient Gender: M              HR:           75 bpm. Exam Location:  Inpatient Procedure: 2D Echo, Color Doppler and Cardiac Doppler Indications:    W09.81 Acute systolic (congestive) heart failure  History:        Patient has no prior history of Echocardiogram examinations.                 COPD; Risk Factors:Hypertension, Diabetes and Dyslipidemia.  Sonographer:    Raquel Sarna Senior RDCS Referring Phys: [phone] AVA SWAYZE  Sonographer Comments: Technically difficult study due to patient body habitus. IMPRESSIONS  1. Left ventricular ejection fraction, by estimation, is 60 to 65%. The left ventricle has normal function. The left ventricle has no regional wall motion abnormalities. Left ventricular diastolic parameters are consistent with Grade I diastolic dysfunction (impaired relaxation).  2. Right ventricular systolic function  is normal. The right ventricular size is normal. Tricuspid regurgitation signal is inadequate for assessing PA pressure.  3. The mitral valve is normal in structure and function. No evidence of mitral valve regurgitation. No evidence of mitral stenosis.  4. The aortic valve is tricuspid. Aortic valve regurgitation is not visualized. Mild aortic valve sclerosis is present, with no evidence of aortic valve stenosis.  5. The inferior vena cava is normal in size with greater than 50% respiratory variability, suggesting right atrial pressure of 3 mmHg. FINDINGS  Left Ventricle: Left ventricular ejection fraction, by estimation, is 60 to 65%. The left ventricle has normal function. The left ventricle has no regional wall motion abnormalities. The left ventricular internal cavity size was normal in size. There is  no left ventricular hypertrophy. Left ventricular diastolic parameters are consistent with Grade I diastolic dysfunction (impaired relaxation). Right Ventricle: The right ventricular size is normal. No increase in right ventricular wall thickness. Right ventricular systolic function is normal. Tricuspid regurgitation signal is inadequate for assessing PA pressure. Left Atrium: Left atrial size was normal in size. Right Atrium: Right atrial size was normal in size. Pericardium: There is no evidence of pericardial effusion. Mitral Valve: The mitral valve is normal in structure and function. No evidence of mitral valve regurgitation. No evidence of mitral valve stenosis. Tricuspid Valve: The tricuspid valve is normal in structure. Tricuspid valve regurgitation is not demonstrated. Aortic Valve: The aortic valve is tricuspid. Aortic valve regurgitation is not visualized. Mild aortic valve sclerosis is present, with no evidence of aortic valve stenosis. Pulmonic Valve: The pulmonic valve was normal in structure. Pulmonic valve regurgitation is not visualized. Aorta: The aortic root is normal in size and structure.  Venous: The inferior vena cava is normal in size with greater than 50% respiratory variability, suggesting right atrial pressure of 3 mmHg. IAS/Shunts: No atrial level shunt detected by color flow Doppler.  LEFT VENTRICLE PLAX 2D LVIDd:         4.01 cm  Diastology LVIDs:         2.56 cm  LV e' lateral:   8.92 cm/s LV PW:         0.90 cm  LV E/e' lateral: 7.2 LV IVS:        0.96 cm  LV e' medial:    6.31 cm/s LVOT diam:     2.10 cm  LV E/e'  medial:  10.1 LV SV:         72.04 ml LV SV Index:   31.95 LVOT Area:     3.46 cm  RIGHT VENTRICLE RV S prime:     9.79 cm/s TAPSE (M-mode): 1.6 cm LEFT ATRIUM             Index       RIGHT ATRIUM          Index LA diam:        2.60 cm 1.74 cm/m  RA Area:     9.83 cm LA Vol (A2C):   29.6 ml 19.85 ml/m RA Volume:   19.60 ml 13.14 ml/m LA Vol (A4C):   31.3 ml 20.99 ml/m LA Biplane Vol: 32.0 ml 21.46 ml/m  AORTIC VALVE LVOT Vmax:   88.70 cm/s LVOT Vmean:  60.400 cm/s LVOT VTI:    0.208 m  AORTA Ao Root diam: 3.20 cm MITRAL VALVE MV Area (PHT): 2.95 cm    SHUNTS MV Decel Time: 257 msec    Systemic VTI:  0.21 m MV E velocity: 64.00 cm/s  Systemic Diam: 2.10 cm MV A velocity: 62.10 cm/s MV E/A ratio:  1.03 Loralie Champagne MD Electronically signed by Loralie Champagne MD Signature Date/Time: 03/13/2019/4:26:40 PM    Final       Discharge Exam: Vitals:   03/31/19 0600 03/31/19 0926  BP:  134/86  Pulse: 77 82  Resp:  20  Temp:  97.7 F (36.5 C)  SpO2:  99%   Vitals:   03/31/19 0030 03/31/19 0421 03/31/19 0600 03/31/19 0926  BP: 108/71 117/74  134/86  Pulse: 79 80 77 82  Resp: _0 Temp:  98.4 F (36.9 C)  97.7 F (36.5 C)  TempSrc:  Oral  Oral  SpO2: 99% 99%  99%  Weight:      Height:        General: Pt is alert, awake, not in acute distress Cardiovascular: RRR, S1/S2 +, no rubs, no gallops Respiratory: CTA bilaterally, no wheezing, no rhonchi Abdominal: Soft, NT, ND, bowel sounds + Extremities: no edema, no cyanosis    The results of significant  diagnostics from this hospitalization (including imaging, microbiology, ancillary and laboratory) are listed below for reference.     Microbiology: Recent Results (from the past 240 hour(s))  Culture, blood (routine x 2)     Status: None (Preliminary result)   Collection Time: 03/29/19  2:40 PM   Specimen: BLOOD  Result Value Ref Range Status   Specimen Description BLOOD LEFT ARM  Final   Special Requests   Final    BOTTLES DRAWN AEROBIC ONLY Blood Culture results may not be optimal due to an inadequate volume of blood received in culture bottles   Culture   Final    NO GROWTH 2 DAYS Performed at [facility], [place] [address], [place], [place] [address]    Report Status PENDING  Incomplete  Culture, blood (routine x 2)     Status: None (Preliminary result)   Collection Time: 03/29/19  2:45 PM   Specimen: BLOOD  Result Value Ref Range Status   Specimen Description BLOOD RIGHT WRIST  Final   Special Requests   Final    BOTTLES DRAWN AEROBIC ONLY Blood Culture results may not be optimal due to an inadequate volume of blood received in culture bottles   Culture   Final    NO GROWTH 2 DAYS Performed at [facility],  [address] [place], [place] [address]    Report Status PENDING  Incomplete  SARS CORONAVIRUS 2 (TAT 6-24 HRS) Nasopharyngeal Nasopharyngeal Swab     Status: None   Collection Time: 03/29/19  3:27 PM   Specimen: Nasopharyngeal Swab  Result Value Ref Range Status   SARS Coronavirus 2 NEGATIVE NEGATIVE Final    Comment: (NOTE) SARS-CoV-2 target nucleic acids are NOT DETECTED. The SARS-CoV-2 RNA is generally detectable in upper and lower respiratory specimens during the acute phase of infection. Negative results do not preclude SARS-CoV-2 infection, do not rule out co-infections with other pathogens, and should not be used as the sole basis for treatment or other patient management decisions. Negative results must be combined with clinical  observations, patient history, and epidemiological information. The expected result is Negative. Fact Sheet for Patients: SugarRoll.be Fact Sheet for Healthcare Providers: https://www.woods-mathews.com/ This test is not yet approved or cleared by the [place] FDA and  has been authorized for detection and/or diagnosis of SARS-CoV-2 by FDA under an Emergency Use Authorization (EUA). This EUA will remain  in effect (meaning this test can be used) for the duration of the COVID-19 declaration under Section 56 4(b)(1) of the Act, 21 U.S.C. section 360bbb-3(b)(1), unless the authorization is terminated or revoked sooner. Performed at [facility], [place] [address] [place], [place] [address]      Labs: BNP (last 3 results) Recent Labs    03/11/19 2000 03/29/19 1415  BNP 229.5* 786.7*   Basic Metabolic Panel: Recent Labs  Lab 03/29/19 1415 03/30/19 0538 03/31/19 1202  NA 142 144 136  K 2.6* 3.4* 4.9  CL 107 115* 104  CO2 _0 GLUCOSE 80 29* 289*  BUN <5* 5* 5*  CREATININE 0.45* 0.49* 0.64  CALCIUM 8.3* 8.1* 8.0*  MG 2.1  --   --    Liver Function Tests: Recent Labs  Lab 03/29/19 1415 03/31/19 1202  AST 29 33  ALT 40 33  ALKPHOS 196* 195*  BILITOT 0.5 0.6  PROT 4.8* 4.6*  ALBUMIN 2.1* 1.8*   No results for input(s): LIPASE, AMYLASE in the last 168 hours. Recent Labs  Lab 03/29/19 1445  AMMONIA 38*   CBC: Recent Labs  Lab 03/29/19 1415 03/30/19 0538 03/31/19 1202  WBC 11.7*  11.8* 8.6 6.1  NEUTROABS 8.8*  --   --   HGB 10.5*  10.5* 9.6* 11.6*  HCT 31.8*  31.8* 28.9* 34.7*  MCV 102.6*  102.9* 101.8* 101.5*  PLT 134*  133* 97* 110*   Cardiac Enzymes: Recent Labs  Lab 03/29/19 1415  CKTOTAL 105   BNP: Invalid input(s): POCBNP CBG: Recent Labs  Lab 03/30/19 1738 03/30/19 2134 03/31/19 0123 03/31/19 0622 03/31/19 1140  GLUCAP 86 270* 291* 287* 289*   D-Dimer No results for  input(s): DDIMER in the last 72 hours. Hgb A1c No results for input(s): HGBA1C in the last 72 hours. Lipid Profile No results for input(s): CHOL, HDL, LDLCALC, TRIG, CHOLHDL, LDLDIRECT in the last 72 hours. Thyroid function studies No results for input(s): TSH, T4TOTAL, T3FREE, THYROIDAB in the last 72 hours.  Invalid input(s): FREET3 Anemia work up No results for input(s): VITAMINB12, FOLATE, FERRITIN, TIBC, IRON, RETICCTPCT in the last 72 hours. Urinalysis    Component Value Date/Time   COLORURINE STRAW (A) 03/29/2019 1855   APPEARANCEUR CLEAR 03/29/2019 1855   LABSPEC 1.004 (Michael) 03/29/2019 1855   PHURINE 8.0 03/29/2019 1855   GLUCOSEU 50 (A) 03/29/2019 1855  HGBUR NEGATIVE 03/29/2019 1855   BILIRUBINUR NEGATIVE 03/29/2019 1855   KETONESUR NEGATIVE 03/29/2019 1855   PROTEINUR NEGATIVE 03/29/2019 1855   UROBILINOGEN 0.2 12/07/2014 0555   NITRITE NEGATIVE 03/29/2019 1855   LEUKOCYTESUR NEGATIVE 03/29/2019 1855   Sepsis Labs Invalid input(s): PROCALCITONIN,  WBC,  LACTICIDVEN Microbiology Recent Results (from the past 240 hour(s))  Culture, blood (routine x 2)     Status: None (Preliminary result)   Collection Time: 03/29/19  2:40 PM   Specimen: BLOOD  Result Value Ref Range Status   Specimen Description BLOOD LEFT ARM  Final   Special Requests   Final    BOTTLES DRAWN AEROBIC ONLY Blood Culture results may not be optimal due to an inadequate volume of blood received in culture bottles   Culture   Final    NO GROWTH 2 DAYS Performed at [facility], [place] [address], [place], [place] [address]    Report Status PENDING  Incomplete  Culture, blood (routine x 2)     Status: None (Preliminary result)   Collection Time: 03/29/19  2:45 PM   Specimen: BLOOD  Result Value Ref Range Status   Specimen Description BLOOD RIGHT WRIST  Final   Special Requests   Final    BOTTLES DRAWN AEROBIC ONLY Blood Culture results may not be optimal due to an inadequate volume of blood  received in culture bottles   Culture   Final    NO GROWTH 2 DAYS Performed at [facility], [place] [address], [place], [place] [address]    Report Status PENDING  Incomplete  SARS CORONAVIRUS 2 (TAT 6-24 HRS) Nasopharyngeal Nasopharyngeal Swab     Status: None   Collection Time: 03/29/19  3:27 PM   Specimen: Nasopharyngeal Swab  Result Value Ref Range Status   SARS Coronavirus 2 NEGATIVE NEGATIVE Final    Comment: (NOTE) SARS-CoV-2 target nucleic acids are NOT DETECTED. The SARS-CoV-2 RNA is generally detectable in upper and lower respiratory specimens during the acute phase of infection. Negative results do not preclude SARS-CoV-2 infection, do not rule out co-infections with other pathogens, and should not be used as the sole basis for treatment or other patient management decisions. Negative results must be combined with clinical observations, patient history, and epidemiological information. The expected result is Negative. Fact Sheet for Patients: SugarRoll.be Fact Sheet for Healthcare Providers: https://www.woods-mathews.com/ This test is not yet approved or cleared by the [place] FDA and  has been authorized for detection and/or diagnosis of SARS-CoV-2 by FDA under an Emergency Use Authorization (EUA). This EUA will remain  in effect (meaning this test can be used) for the duration of the COVID-19 declaration under Section 56 4(b)(1) of the Act, 21 U.S.C. section 360bbb-3(b)(1), unless the authorization is terminated or revoked sooner. Performed at [facility], [place] [address] [place], [place] [address]      Time coordinating discharge: Over 30 minutes  SIGNED:   Darliss Cheney, MD  [facility] 03/31/2019, 1:34 PM  If 7PM-7AM, please contact night-coverage [email or web address]

## 2019-04-03 LAB — CULTURE, BLOOD (ROUTINE X 2)
Culture: NO GROWTH
Culture: NO GROWTH

## 2019-04-09 ENCOUNTER — Encounter (HOSPITAL_COMMUNITY): Payer: Self-pay | Admitting: Emergency Medicine

## 2019-04-09 ENCOUNTER — Observation Stay (HOSPITAL_COMMUNITY)
Admission: EM | Admit: 2019-04-09 | Discharge: 2019-04-10 | Discharge status: Against Medical Advice | Payer: Medicare (Managed Care) | Attending: Internal Medicine | Admitting: Internal Medicine

## 2019-04-09 ENCOUNTER — Other Ambulatory Visit: Payer: Self-pay

## 2019-04-09 ENCOUNTER — Emergency Department (HOSPITAL_COMMUNITY): Payer: Medicare (Managed Care)

## 2019-04-09 DIAGNOSIS — E1142 Type 2 diabetes mellitus with diabetic polyneuropathy: Secondary | ICD-10-CM | POA: Diagnosis not present

## 2019-04-09 DIAGNOSIS — R6520 Severe sepsis without septic shock: Secondary | ICD-10-CM | POA: Insufficient documentation

## 2019-04-09 DIAGNOSIS — E162 Hypoglycemia, unspecified: Secondary | ICD-10-CM

## 2019-04-09 DIAGNOSIS — Z7982 Long term (current) use of aspirin: Secondary | ICD-10-CM | POA: Insufficient documentation

## 2019-04-09 DIAGNOSIS — E11649 Type 2 diabetes mellitus with hypoglycemia without coma: Secondary | ICD-10-CM | POA: Diagnosis present

## 2019-04-09 DIAGNOSIS — R569 Unspecified convulsions: Secondary | ICD-10-CM | POA: Insufficient documentation

## 2019-04-09 DIAGNOSIS — F319 Bipolar disorder, unspecified: Secondary | ICD-10-CM | POA: Insufficient documentation

## 2019-04-09 DIAGNOSIS — Z794 Long term (current) use of insulin: Secondary | ICD-10-CM | POA: Diagnosis not present

## 2019-04-09 DIAGNOSIS — K219 Gastro-esophageal reflux disease without esophagitis: Secondary | ICD-10-CM | POA: Diagnosis not present

## 2019-04-09 DIAGNOSIS — E876 Hypokalemia: Secondary | ICD-10-CM

## 2019-04-09 DIAGNOSIS — F419 Anxiety disorder, unspecified: Secondary | ICD-10-CM | POA: Insufficient documentation

## 2019-04-09 DIAGNOSIS — I959 Hypotension, unspecified: Secondary | ICD-10-CM | POA: Insufficient documentation

## 2019-04-09 DIAGNOSIS — J189 Pneumonia, unspecified organism: Secondary | ICD-10-CM | POA: Diagnosis not present

## 2019-04-09 DIAGNOSIS — L97519 Non-pressure chronic ulcer of other part of right foot with unspecified severity: Secondary | ICD-10-CM | POA: Diagnosis not present

## 2019-04-09 DIAGNOSIS — Z20822 Contact with and (suspected) exposure to covid-19: Secondary | ICD-10-CM | POA: Diagnosis not present

## 2019-04-09 DIAGNOSIS — E785 Hyperlipidemia, unspecified: Secondary | ICD-10-CM | POA: Diagnosis not present

## 2019-04-09 DIAGNOSIS — E559 Vitamin D deficiency, unspecified: Secondary | ICD-10-CM | POA: Diagnosis not present

## 2019-04-09 DIAGNOSIS — Z79899 Other long term (current) drug therapy: Secondary | ICD-10-CM | POA: Diagnosis not present

## 2019-04-09 DIAGNOSIS — F209 Schizophrenia, unspecified: Secondary | ICD-10-CM | POA: Diagnosis not present

## 2019-04-09 DIAGNOSIS — L97411 Non-pressure chronic ulcer of right heel and midfoot limited to breakdown of skin: Secondary | ICD-10-CM

## 2019-04-09 DIAGNOSIS — I10 Essential (primary) hypertension: Secondary | ICD-10-CM | POA: Diagnosis present

## 2019-04-09 DIAGNOSIS — E440 Moderate protein-calorie malnutrition: Secondary | ICD-10-CM | POA: Diagnosis present

## 2019-04-09 DIAGNOSIS — T68XXXA Hypothermia, initial encounter: Secondary | ICD-10-CM

## 2019-04-09 DIAGNOSIS — E11621 Type 2 diabetes mellitus with foot ulcer: Secondary | ICD-10-CM | POA: Diagnosis not present

## 2019-04-09 DIAGNOSIS — Z6821 Body mass index (BMI) 21.0-21.9, adult: Secondary | ICD-10-CM | POA: Insufficient documentation

## 2019-04-09 DIAGNOSIS — A419 Sepsis, unspecified organism: Secondary | ICD-10-CM | POA: Diagnosis not present

## 2019-04-09 DIAGNOSIS — F17210 Nicotine dependence, cigarettes, uncomplicated: Secondary | ICD-10-CM | POA: Insufficient documentation

## 2019-04-09 DIAGNOSIS — E1151 Type 2 diabetes mellitus with diabetic peripheral angiopathy without gangrene: Secondary | ICD-10-CM | POA: Diagnosis not present

## 2019-04-09 LAB — HEPATIC FUNCTION PANEL
ALT: 31 U/L (ref 0–44)
AST: 27 U/L (ref 15–41)
Albumin: 1.9 g/dL — ABNORMAL LOW (ref 3.5–5.0)
Alkaline Phosphatase: 178 U/L — ABNORMAL HIGH (ref 38–126)
Bilirubin, Direct: 0.2 mg/dL (ref 0.0–0.2)
Indirect Bilirubin: 0.1 mg/dL — ABNORMAL LOW (ref 0.3–0.9)
Total Bilirubin: 0.3 mg/dL (ref 0.3–1.2)
Total Protein: 4.3 g/dL — ABNORMAL LOW (ref 6.5–8.1)

## 2019-04-09 LAB — CBC WITH DIFFERENTIAL/PLATELET
Abs Immature Granulocytes: 0.13 10*3/uL — ABNORMAL HIGH (ref 0.00–0.07)
Basophils Absolute: 0 10*3/uL (ref 0.0–0.1)
Basophils Relative: 0 %
Eosinophils Absolute: 0 10*3/uL (ref 0.0–0.5)
Eosinophils Relative: 0 %
HCT: 30.7 % — ABNORMAL LOW (ref 39.0–52.0)
Hemoglobin: 9.9 g/dL — ABNORMAL LOW (ref 13.0–17.0)
Immature Granulocytes: 1 %
Lymphocytes Relative: 18 %
Lymphs Abs: 2.4 10*3/uL (ref 0.7–4.0)
MCH: 34.5 pg — ABNORMAL HIGH (ref 26.0–34.0)
MCHC: 32.2 g/dL (ref 30.0–36.0)
MCV: 107 fL — ABNORMAL HIGH (ref 80.0–100.0)
Monocytes Absolute: 1.1 10*3/uL — ABNORMAL HIGH (ref 0.1–1.0)
Monocytes Relative: 8 %
Neutro Abs: 9.8 10*3/uL — ABNORMAL HIGH (ref 1.7–7.7)
Neutrophils Relative %: 73 %
Platelets: 222 10*3/uL (ref 150–400)
RBC: 2.87 MIL/uL — ABNORMAL LOW (ref 4.22–5.81)
RDW: 14.9 % (ref 11.5–15.5)
WBC: 13.6 10*3/uL — ABNORMAL HIGH (ref 4.0–10.5)
nRBC: 0 % (ref 0.0–0.2)

## 2019-04-09 LAB — CBG MONITORING, ED
Glucose-Capillary: 36 mg/dL — CL (ref 70–99)
Glucose-Capillary: 97 mg/dL (ref 70–99)
Glucose-Capillary: 11 mg/dL — CL (ref 70–99)
Glucose-Capillary: 70 mg/dL (ref 70–99)
Glucose-Capillary: 166 mg/dL — ABNORMAL HIGH (ref 70–99)
Glucose-Capillary: 167 mg/dL — ABNORMAL HIGH (ref 70–99)

## 2019-04-09 LAB — BASIC METABOLIC PANEL
Anion gap: 7 (ref 5–15)
BUN: 5 mg/dL — ABNORMAL LOW (ref 8–23)
CO2: 24 mmol/L (ref 22–32)
Calcium: 8.5 mg/dL — ABNORMAL LOW (ref 8.9–10.3)
Chloride: 112 mmol/L — ABNORMAL HIGH (ref 98–111)
Creatinine, Ser: 0.5 mg/dL — ABNORMAL LOW (ref 0.61–1.24)
GFR calc Af Amer: >60 mL/min (ref >60)
GFR calc non Af Amer: >60 mL/min (ref >60)
Glucose, Bld: 46 mg/dL — ABNORMAL LOW (ref 70–99)
Potassium: 2.7 mmol/L — CL (ref 3.5–5.1)
Sodium: 143 mmol/L (ref 135–145)

## 2019-04-09 LAB — PROTIME-INR
INR: 1.1 (ref 0.8–1.2)
Prothrombin Time: 14.6 seconds (ref 11.4–15.2)

## 2019-04-09 LAB — APTT: aPTT: 28 seconds (ref 24–36)

## 2019-04-09 LAB — GLUCOSE, CAPILLARY: Glucose-Capillary: 269 mg/dL — ABNORMAL HIGH (ref 70–99)

## 2019-04-09 LAB — LACTIC ACID, PLASMA
Lactic Acid, Venous: 2.1 mmol/L (ref 0.5–1.9)
Lactic Acid, Venous: 2.7 mmol/L (ref 0.5–1.9)

## 2019-04-09 LAB — ETHANOL: Alcohol, Ethyl (B): <10 mg/dL (ref <10)

## 2019-04-09 MED ORDER — DEXTROSE 50 % IV SOLN
INTRAVENOUS | Status: AC
  Filled 2019-04-09: qty 50

## 2019-04-09 MED ORDER — DEXTROSE 10 % IV SOLN: INTRAVENOUS | Status: DC

## 2019-04-09 MED ORDER — VANCOMYCIN HCL IN DEXTROSE 1-5 GM/200ML-% IV SOLN: 1000.0000 mg | Freq: Once | INTRAVENOUS | Status: DC

## 2019-04-09 MED ORDER — POTASSIUM CHLORIDE 10 MEQ/100ML IV SOLN
10.0000 meq | INTRAVENOUS | Status: AC
  Administered 2019-04-09: 10 meq via INTRAVENOUS
  Filled 2019-04-09: qty 100

## 2019-04-09 MED ORDER — SODIUM CHLORIDE 0.9 % IV BOLUS
30.0000 mL/kg | Freq: Once | INTRAVENOUS | Status: AC
  Administered 2019-04-09: 1671 mL via INTRAVENOUS

## 2019-04-09 MED ORDER — SODIUM CHLORIDE 0.9 % IV SOLN
2.0000 g | Freq: Once | INTRAVENOUS | Status: AC
  Administered 2019-04-09: 18:00:00 2 g via INTRAVENOUS
  Filled 2019-04-09: qty 2

## 2019-04-09 MED ORDER — DEXTROSE 50 % IV SOLN
INTRAVENOUS | Status: AC
  Filled 2019-04-09: qty 100

## 2019-04-09 MED ORDER — SODIUM CHLORIDE 0.9% FLUSH: 3.0000 mL | INTRAVENOUS | Status: DC | PRN

## 2019-04-09 NOTE — Assessment & Plan Note (Signed)
Blood cx taken in ER. Pt given cefepime and vancomycin. Will initiate CAP treatment with Rocephin/Zithromax. If not improved in 24 hours, consider changing ABX regimen to cover HCAP due to recent hospitalization.

## 2019-04-09 NOTE — Progress Notes (Signed)
A consult was received from an ED physician for vancomycin per pharmacy dosing (for an indication other than meningitis). The patient's profile has been reviewed for ht/wt/allergies/indication/available labs. A one time order has been placed for the above antibiotics.  Further antibiotics/pharmacy consults should be ordered by admitting physician if indicated.                       Reuel Boom, PharmD, BCPS [phone] 04/09/2019, 6:08 PM

## 2019-04-09 NOTE — Assessment & Plan Note (Signed)
Needs local wound care.

## 2019-04-09 NOTE — Assessment & Plan Note (Signed)
Present on admission. Etiologies include pt's diabetic hypoglycemia and RML pneumonia. Acute organ dysfunction due to elevated lactic acid.  However, pt's lactic acid has been elevated on multiple prior occasions. But pt is hypothermic requiring external warming and has leukocytosis.

## 2019-04-09 NOTE — ED Notes (Signed)
Patient has a extra lav tube in main lab

## 2019-04-09 NOTE — ED Notes (Signed)
This Probation officer heard pt yelling and cussing at [employer], Therapist, sports. This Probation officer attempted to help Judson Roch, RN to deescalate the pt but pt continually yelling and cussing at staff. Patient sitting on the edge of stretcher, throwing and waving his hands around. Pt asked to move up on the stretcher to a safer location to keep from falling out of the bed. When writer asked the pt to move, pt responded with, "don't you put your fucking hands on me you bitch". Writer asked pt to refrain from calling staff members anything but their name, and pt stated, "go fuck yourself" and "I hope you kill yourself" to both Probation officer and Judson Roch, Therapist, sports. Writer called security for safety of staff and pt. With security at bedside, pt was transported to [facility] with security for safety measures. Oncoming nursing staff made aware of the pt's behavior in the ED.

## 2019-04-09 NOTE — ED Notes (Signed)
Pt provided ham sandwich, graham crackers, apple juice per his request.

## 2019-04-09 NOTE — Assessment & Plan Note (Signed)
Admit to telemetry bed. Continue with D10W due to persistent hypoglycemia. With pt's persistent hypoglycemia despite multiple rounds of D50, pt likely ingested his prior Rx for glipzide.  Pt denies taking any insulin injections.

## 2019-04-09 NOTE — Sepsis Progress Note (Signed)
Notified bedside nurse of need to administer antibiotics.  
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
## 2019-04-09 NOTE — ED Triage Notes (Signed)
Per EMS-noncompliant with DM/all meds-family called EMS for seizure like activity-CBG 28-patient given D10-CBG 127-200cc of NS of fluid given BP 106/66, HR 76

## 2019-04-09 NOTE — Assessment & Plan Note (Signed)
Continue with external warming with Retail banker

## 2019-04-09 NOTE — Assessment & Plan Note (Signed)
Hold BP meds for now.

## 2019-04-09 NOTE — ED Notes (Signed)
Pt CBG was 166. RN Notified.

## 2019-04-09 NOTE — Assessment & Plan Note (Signed)
Replete with oral KCl. Repeat CMP in AM.

## 2019-04-09 NOTE — ED Notes (Signed)
Date and time results received: 04/09/19 5:04 PM  (use smartphrase ".now" to insert current time)  Test: Potassium Critical Value: 2.7  Name of Provider Notified: Shanon Brow NP  Orders Received? Or Actions Taken?: Orders Received - See Orders for details
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
## 2019-04-09 NOTE — Assessment & Plan Note (Signed)
Pt with chronic malnutrition as evident by his low total protein and low serum albumin.

## 2019-04-09 NOTE — Subjective & Objective (Signed)
CC: altered mental status, hypoglycemia HPI: 64 year old African-American male with a history of type 2 diabetes, bipolar disorder, hypertension, frequent hospitalizations presents via EMS due to hypoglycemia.  Patient's blood sugar noted to be less than 50.  Patient given multiple rounds of D50.  Patient persistently hyperglycemic.  Patient started D10 infusion but was still hyperglycemic.  Patient noted to be hypothermic with core temperature of 94.2.  Patient also noted to have leukocytosis.  Antibiotic therapy initiated.  Due to sepsis criteria, patient was given 30 cc/kg of normal saline.  Patient has a history of frequent hospitalizations.  Patient is a poor historian.  He states that he did not take any insulin today.  He does not recall if he took any of his diabetic medications.  He denies any recent alcohol use.  He continues to smoke.  Patient noted to be yelling and screaming in the ER.  Patient wanted to eat more graham crackers.  In the ER, antibiotic therapy initiated with cefepime and vancomycin due to recent hospitalizations.  Patient noted to have leukocytosis.  Patient noted to have an elevated lactic acid.  Repeat lactic acid is currently pending.  Chest x-ray demonstrated right middle lobe pneumonia.  To the patient's persistent hyperglycemia, hypothermia, patient will be admitted to the hospital.

## 2019-04-09 NOTE — Sepsis Progress Note (Signed)
Unable to collect repeat lactic acid d/t patient agitation and threatening staff.

## 2019-04-10 DIAGNOSIS — E11649 Type 2 diabetes mellitus with hypoglycemia without coma: Secondary | ICD-10-CM | POA: Diagnosis not present

## 2019-04-10 LAB — COMPREHENSIVE METABOLIC PANEL
ALT: 28 U/L (ref 0–44)
AST: 20 U/L (ref 15–41)
Albumin: 1.7 g/dL — ABNORMAL LOW (ref 3.5–5.0)
Alkaline Phosphatase: 166 U/L — ABNORMAL HIGH (ref 38–126)
Anion gap: 4 — ABNORMAL LOW (ref 5–15)
BUN: <5 mg/dL — ABNORMAL LOW (ref 8–23)
CO2: 24 mmol/L (ref 22–32)
Calcium: 7.8 mg/dL — ABNORMAL LOW (ref 8.9–10.3)
Chloride: 116 mmol/L — ABNORMAL HIGH (ref 98–111)
Creatinine, Ser: 0.58 mg/dL — ABNORMAL LOW (ref 0.61–1.24)
GFR calc Af Amer: >60 mL/min (ref >60)
GFR calc non Af Amer: >60 mL/min (ref >60)
Glucose, Bld: 312 mg/dL — ABNORMAL HIGH (ref 70–99)
Potassium: 3.3 mmol/L — ABNORMAL LOW (ref 3.5–5.1)
Sodium: 144 mmol/L (ref 135–145)
Total Bilirubin: 0.5 mg/dL (ref 0.3–1.2)
Total Protein: 3.9 g/dL — ABNORMAL LOW (ref 6.5–8.1)

## 2019-04-10 LAB — URINALYSIS, ROUTINE W REFLEX MICROSCOPIC
Bilirubin Urine: NEGATIVE
Glucose, UA: 150 mg/dL — AB
Ketones, ur: NEGATIVE mg/dL
Nitrite: NEGATIVE
Protein, ur: NEGATIVE mg/dL
Specific Gravity, Urine: 1.009 (ref 1.005–1.030)
pH: 6 (ref 5.0–8.0)

## 2019-04-10 LAB — CBC WITH DIFFERENTIAL/PLATELET
Abs Immature Granulocytes: 0.03 10*3/uL (ref 0.00–0.07)
Basophils Absolute: 0 10*3/uL (ref 0.0–0.1)
Basophils Relative: 0 %
Eosinophils Absolute: 0.1 10*3/uL (ref 0.0–0.5)
Eosinophils Relative: 1 %
HCT: 28.4 % — ABNORMAL LOW (ref 39.0–52.0)
Hemoglobin: 9.2 g/dL — ABNORMAL LOW (ref 13.0–17.0)
Immature Granulocytes: 0 %
Lymphocytes Relative: 26 %
Lymphs Abs: 2 10*3/uL (ref 0.7–4.0)
MCH: 34.5 pg — ABNORMAL HIGH (ref 26.0–34.0)
MCHC: 32.4 g/dL (ref 30.0–36.0)
MCV: 106.4 fL — ABNORMAL HIGH (ref 80.0–100.0)
Monocytes Absolute: 0.7 10*3/uL (ref 0.1–1.0)
Monocytes Relative: 9 %
Neutro Abs: 4.8 10*3/uL (ref 1.7–7.7)
Neutrophils Relative %: 64 %
Platelets: 170 10*3/uL (ref 150–400)
RBC: 2.67 MIL/uL — ABNORMAL LOW (ref 4.22–5.81)
RDW: 14.9 % (ref 11.5–15.5)
WBC: 7.5 10*3/uL (ref 4.0–10.5)
nRBC: 0 % (ref 0.0–0.2)

## 2019-04-10 LAB — GLUCOSE, CAPILLARY
Glucose-Capillary: 311 mg/dL — ABNORMAL HIGH (ref 70–99)
Glucose-Capillary: 127 mg/dL — ABNORMAL HIGH (ref 70–99)
Glucose-Capillary: 25 mg/dL — CL (ref 70–99)
Glucose-Capillary: 53 mg/dL — ABNORMAL LOW (ref 70–99)
Glucose-Capillary: 109 mg/dL — ABNORMAL HIGH (ref 70–99)

## 2019-04-10 LAB — AMMONIA: Ammonia: 25 umol/L (ref 9–35)

## 2019-04-10 LAB — PROCALCITONIN
Procalcitonin: <0.1 ng/mL
Procalcitonin: <0.1 ng/mL

## 2019-04-10 LAB — SARS CORONAVIRUS 2 (TAT 6-24 HRS): SARS Coronavirus 2: NEGATIVE

## 2019-04-10 MED ORDER — INSULIN ASPART 100 UNIT/ML ~~LOC~~ SOLN: 0.0000 [IU] | Freq: Three times a day (TID) | SUBCUTANEOUS | Status: DC

## 2019-04-10 MED ORDER — SODIUM CHLORIDE 0.9 % IV SOLN
2.0000 g | INTRAVENOUS | Status: DC
  Administered 2019-04-10: 2 g via INTRAVENOUS
  Filled 2019-04-10: qty 2

## 2019-04-10 MED ORDER — ENOXAPARIN SODIUM 40 MG/0.4ML ~~LOC~~ SOLN
40.0000 mg | SUBCUTANEOUS | Status: DC
  Administered 2019-04-10: 40 mg via SUBCUTANEOUS
  Filled 2019-04-10: qty 0.4

## 2019-04-10 MED ORDER — SODIUM CHLORIDE 0.9% FLUSH
3.0000 mL | Freq: Two times a day (BID) | INTRAVENOUS | Status: DC
  Administered 2019-04-10 (×2): 3 mL via INTRAVENOUS

## 2019-04-10 MED ORDER — POTASSIUM CHLORIDE CRYS ER 20 MEQ PO TBCR
30.0000 meq | EXTENDED_RELEASE_TABLET | ORAL | Status: AC
  Administered 2019-04-10 (×3): 30 meq via ORAL
  Filled 2019-04-10 (×3): qty 1

## 2019-04-10 MED ORDER — SODIUM CHLORIDE 0.9 % IV SOLN: INTRAVENOUS | Status: DC

## 2019-04-10 MED ORDER — AZITHROMYCIN 250 MG PO TABS
500.0000 mg | ORAL_TABLET | Freq: Every day | ORAL | Status: DC
  Administered 2019-04-10: 500 mg via ORAL
  Filled 2019-04-10 (×2): qty 2

## 2019-04-10 MED ORDER — INSULIN ASPART 100 UNIT/ML ~~LOC~~ SOLN
2.0000 [IU] | Freq: Once | SUBCUTANEOUS | Status: AC
  Administered 2019-04-10: 2 [IU] via SUBCUTANEOUS

## 2019-04-10 NOTE — Progress Notes (Signed)
Spoke with Dr Bridgett Larsson.  Okay to discontinue telemetry as pt is no longer hypothermic.
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
## 2019-04-10 NOTE — Progress Notes (Addendum)
Hypoglycemic Event  CBG: 25  Treatment: orange juice and peanut butter cracker Symptoms: confusion  Follow-up CBG: Time:0745 CBG Result:53 Follow up at 0800      Result 127 Possible Reasons for Event: patient had not eaten breakfast  Comments/MD notified:Joseph, Preetha notified awaiting orders. MD recommended more juice, and recheck shows acceptable glucose levels at 0800.    Evonne Rinks Hetty Ely
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
## 2019-04-10 NOTE — Progress Notes (Signed)
rm 1301, Jaziel Polhemus [id number], in wit hypoglycemia.  Hypoglycemia ia resolving, but Lactic acid is 2.7.  Receiving NS@ 75cc/o.  Any thing
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
## 2019-04-11 LAB — URINE CULTURE

## 2019-04-11 LAB — GLUCOSE, CAPILLARY: Glucose-Capillary: 315 mg/dL — ABNORMAL HIGH (ref 70–99)

## 2019-04-14 LAB — CULTURE, BLOOD (ROUTINE X 2)
Culture: NO GROWTH
Culture: NO GROWTH
Special Requests: ADEQUATE
Special Requests: ADEQUATE

## 2019-08-07 ENCOUNTER — Emergency Department (HOSPITAL_COMMUNITY)
Admission: EM | Admit: 2019-08-07 | Discharge: 2019-08-07 | Disposition: A | Discharge status: Home / Self Care | Payer: Medicare HMO | Attending: Emergency Medicine | Admitting: Emergency Medicine

## 2019-08-07 ENCOUNTER — Encounter (HOSPITAL_COMMUNITY): Payer: Self-pay | Admitting: Emergency Medicine

## 2019-08-07 DIAGNOSIS — R531 Weakness: Secondary | ICD-10-CM | POA: Diagnosis present

## 2019-08-07 DIAGNOSIS — Z79899 Other long term (current) drug therapy: Secondary | ICD-10-CM | POA: Insufficient documentation

## 2019-08-07 DIAGNOSIS — E1165 Type 2 diabetes mellitus with hyperglycemia: Secondary | ICD-10-CM | POA: Insufficient documentation

## 2019-08-07 DIAGNOSIS — I10 Essential (primary) hypertension: Secondary | ICD-10-CM | POA: Insufficient documentation

## 2019-08-07 DIAGNOSIS — E1140 Type 2 diabetes mellitus with diabetic neuropathy, unspecified: Secondary | ICD-10-CM | POA: Diagnosis not present

## 2019-08-07 DIAGNOSIS — Z794 Long term (current) use of insulin: Secondary | ICD-10-CM | POA: Insufficient documentation

## 2019-08-07 DIAGNOSIS — E11649 Type 2 diabetes mellitus with hypoglycemia without coma: Secondary | ICD-10-CM | POA: Insufficient documentation

## 2019-08-07 DIAGNOSIS — F17210 Nicotine dependence, cigarettes, uncomplicated: Secondary | ICD-10-CM | POA: Diagnosis not present

## 2019-08-07 DIAGNOSIS — R739 Hyperglycemia, unspecified: Secondary | ICD-10-CM

## 2019-08-07 LAB — BASIC METABOLIC PANEL
Anion gap: 13 (ref 5–15)
BUN: <5 mg/dL — ABNORMAL LOW (ref 8–23)
CO2: 24 mmol/L (ref 22–32)
Calcium: 8.4 mg/dL — ABNORMAL LOW (ref 8.9–10.3)
Chloride: 100 mmol/L (ref 98–111)
Creatinine, Ser: 0.59 mg/dL — ABNORMAL LOW (ref 0.61–1.24)
GFR calc Af Amer: >60 mL/min (ref >60)
GFR calc non Af Amer: >60 mL/min (ref >60)
Glucose, Bld: 666 mg/dL (ref 70–99)
Potassium: 3.5 mmol/L (ref 3.5–5.1)
Sodium: 137 mmol/L (ref 135–145)

## 2019-08-07 LAB — CBC
HCT: 40.4 % (ref 39.0–52.0)
Hemoglobin: 14.2 g/dL (ref 13.0–17.0)
MCH: 32.3 pg (ref 26.0–34.0)
MCHC: 35.1 g/dL (ref 30.0–36.0)
MCV: 91.8 fL (ref 80.0–100.0)
Platelets: 148 10*3/uL — ABNORMAL LOW (ref 150–400)
RBC: 4.4 MIL/uL (ref 4.22–5.81)
RDW: 13.1 % (ref 11.5–15.5)
WBC: 7.5 10*3/uL (ref 4.0–10.5)
nRBC: 0 % (ref 0.0–0.2)

## 2019-08-07 LAB — CBG MONITORING, ED
Glucose-Capillary: 591 mg/dL (ref 70–99)
Glucose-Capillary: 269 mg/dL — ABNORMAL HIGH (ref 70–99)

## 2019-08-07 MED ORDER — INSULIN ASPART 100 UNIT/ML ~~LOC~~ SOLN
20.0000 [IU] | Freq: Once | SUBCUTANEOUS | Status: AC
  Administered 2019-08-07: 20 [IU] via SUBCUTANEOUS
  Filled 2019-08-07: qty 0.2

## 2019-08-07 MED ORDER — SODIUM CHLORIDE 0.9 % IV BOLUS
1000.0000 mL | Freq: Once | INTRAVENOUS | Status: AC
  Administered 2019-08-07: 1000 mL via INTRAVENOUS

## 2019-08-07 MED ORDER — OXYCODONE-ACETAMINOPHEN 5-325 MG PO TABS
2.0000 | ORAL_TABLET | Freq: Once | ORAL | Status: AC
  Administered 2019-08-07: 2 via ORAL
  Filled 2019-08-07: qty 2

## 2019-08-07 NOTE — ED Provider Notes (Addendum)
[facility] DEPT Provider Note   CSN: [id number] Arrival date & time: 08/07/19  1313     History Chief Complaint  Patient presents with  . Hyperglycemia    Michael [name] is a 64 y.o. male.  Patient presents to the emergency department with weakness patient also complains of hurting all over he was at the pain center and he relates his sugar was elevated.  The history is provided by the patient and medical records. No language interpreter was used.  Weakness Severity:  Moderate Onset quality:  Sudden Timing:  Constant Progression:  Worsening Chronicity:  Recurrent Context: not alcohol use   Relieved by:  Nothing Worsened by:  Nothing Ineffective treatments:  None tried Associated symptoms: no abdominal pain, no chest pain, no cough, no diarrhea, no frequency, no headaches and no seizures   Risk factors: no anemia        Past Medical History:  Diagnosis Date  . Anxiety   . Arthritis    "joints; shoulders; feet" (06/08/2016)  . Bipolar disorder ([place])   . Daily headache    "7:30 - 8:00 q night" (06/08/2016)  . Diabetic peripheral neuropathy ([place])   . GERD (gastroesophageal reflux disease)   . History of hepatitis B virus infection conferring immunity 03/22/2018  . Pancreatitis   . Schizophrenia ([place])   . Seizures ([place]) 01/2016 X 2  . Type II diabetes mellitus [facility])     Patient Active Problem List   Diagnosis Date Noted  . Diabetic hypoglycemia ([place]) 04/09/2019  . CAP (community acquired pneumonia) 04/09/2019  . Diabetic ulcer of right foot ([place]) - dorsum of right foot 04/09/2019  . Hypothermia 03/10/2019  . Sepsis with acute organ dysfunction ([place]) 03/10/2019  . Seizure ([place]) 02/26/2019  . History of hepatitis B virus infection conferring immunity 03/22/2018  . Alcohol use disorder, severe, dependence ([place]) 12/29/2017  . Malnutrition of moderate degree 03/30/2017  . Hypoglycemia due to insulin 03/26/2017  . Acute metabolic  encephalopathy 20/10/710  . Other constipation 10/16/2016  . Recurrent falls 07/07/2016  . Wernicke's encephalopathy   . Bipolar disorder ([place]) 06/01/2016  . Dry skin 01/18/2016  . Peripheral vascular disease ([place]) 11/15/2015  . Hyperglycemia 09/02/2015  . Tobacco abuse 09/02/2015  . Tricompartment osteoarthritis of both knees 06/15/2015  . Vitamin D deficiency 03/12/2015  . Hyperlipidemia 02/17/2015  . Hypokalemia 12/05/2014  . Protein-calorie malnutrition, severe 11/18/2014  . Alcohol use   . Cervical arthritis 01/01/2014  . Health care maintenance 08/25/2011  . Chronic alcoholic pancreatitis ([place]) 04/20/2011  . Type 2 diabetes mellitus with peripheral neuropathy ([place]) 12/09/2010  . HTN (hypertension) 12/09/2010    Past Surgical History:  Procedure Laterality Date  . BALLOON DILATION  03/22/2011   Procedure: BALLOON DILATION;  Surgeon: Gatha Mayer, MD;  [facility]: [facility];  Service: Endoscopy;  Laterality: N/A;  . COLONOSCOPY N/A 05/22/2012   Procedure: COLONOSCOPY;  Surgeon: Gatha Mayer, MD;  Location: [facility];  Service: Endoscopy;  Laterality: N/A;  . COLONOSCOPY W/ BIOPSIES AND POLYPECTOMY  09/12/11  . ESOPHAGOGASTRODUODENOSCOPY  03/22/2011   Procedure: ESOPHAGOGASTRODUODENOSCOPY (EGD);  Surgeon: Gatha Mayer, MD;  Location: [facility];  Service: Endoscopy;  Laterality: N/A;  egd with balloon   . EUS  04/20/2011   Procedure: UPPER ENDOSCOPIC ULTRASOUND (EUS) LINEAR;  Surgeon: Milus Banister, MD;  [facility];  Service: Endoscopy;  Laterality: N/A;  . KNEE ARTHROSCOPY Left        Family History  Problem Relation  Age of Onset  . Hypertension Sister   . Diabetes Sister   . Heart disease Sister   . Colon cancer Neg Hx   . Stomach cancer Neg Hx   . Anesthesia problems Neg Hx   . Hypotension Neg Hx   . Pseudochol deficiency Neg Hx   . Malignant hyperthermia Neg Hx     Social History   Tobacco Use  . Smoking status: Current Every Day  Smoker    Packs/day: 0.50    Years: 42.00    Pack years: 21.00    Types: Cigarettes  . Smokeless tobacco: Never Used  . Tobacco comment: Sometimes less.  Vaping Use  . Vaping Use: Never used  Substance Use Topics  . Alcohol use: Yes    Alcohol/week: 2.0 standard drinks    Types: 2 Cans of beer per week  . Drug use: Not Currently    Types: Marijuana    Comment: Sober for 2 years    Home Medications Prior to Admission medications   Medication Sig Start Date End Date Taking? Authorizing Provider  ACCU-CHEK SOFTCLIX LANCETS lancets Use 3 times daily to check blood sugar. diag code E11.42. Insulin dependent 03/30/17   Emokpae, Courage, MD  albuterol (VENTOLIN HFA) 108 (90 Base) MCG/ACT inhaler Inhale 1 puff into the lungs every 4 (four) hours as needed for wheezing or shortness of breath.    [provider]  Alcohol Swabs (B-D SINGLE USE SWABS REGULAR) PADS Patient uses 6 times a day. Patient tests 3 times per day and administers insulin 3 times a day. ICD 10 code E11.42 03/30/17   Roxan Hockey, MD  ammonium lactate (LAC-HYDRIN) 12 % cream Apply topically as needed for dry skin. 10/10/17   Gardiner Barefoot, DPM  ARIPiprazole (ABILIFY) 2 MG tablet Take 2 mg by mouth at bedtime.     [provider]  aspirin EC 81 MG tablet Take 81 mg by mouth daily.     [provider]  Blood Glucose Calibration (ACCU-CHEK AVIVA) SOLN 1 drop by In Vitro route as needed. diag code E08.10 insulin dependent. Use as device directs 03/30/17   Roxan Hockey, MD  Blood Glucose Monitoring Suppl (ACCU-CHEK AVIVA PLUS) w/Device KIT 1 kit by Does not apply route every morning. diag code E11.42. Insulin dependent 03/30/17   Roxan Hockey, MD  Buprenorphine HCl (BELBUCA) 900 MCG FILM Place 900 mcg inside cheek every 12 (twelve) hours.     [provider]  diclofenac sodium (VOLTAREN) 1 % GEL Apply 2 g topically 4 (four) times daily -  before meals and at bedtime. 01/05/17   Rice,  Resa Miner, MD  DULoxetine (CYMBALTA) 30 MG capsule Take 30 mg by mouth daily.    [provider]  feeding supplement, GLUCERNA SHAKE, (GLUCERNA SHAKE) LIQD Take 237 mLs by mouth 3 (three) times daily between meals. 03/30/17   Roxan Hockey, MD  folic acid (FOLVITE) 1 MG tablet Take 1 tablet (1 mg total) by mouth daily. 03/30/17   Roxan Hockey, MD  gabapentin (NEURONTIN) 300 MG capsule TAKE 3 CAPSULES BY MOUTH  EVERY MORNING AND  AFTERNOON. TAKE 4 CAPSULES  BY MOUTH AT NIGHT BEFORE  BED Patient not taking: Reported on 04/10/2019 01/05/17   Collier Salina, MD  gabapentin (NEURONTIN) 600 MG tablet Take 600 mg by mouth 3 (three) times daily.  04/03/19   [provider]  glipiZIDE (GLUCOTROL) 5 MG tablet Take 5 mg by mouth daily before breakfast.    [provider]  glucose blood (ACCU-CHEK AVIVA PLUS) test strip Use 3 times daily to check blood sugar. diag code E11.42. Insulin dependent 03/30/17   Roxan Hockey, MD  hydrOXYzine (ATARAX/VISTARIL) 25 MG tablet Take 25 mg by mouth 2 (two) times daily as needed for anxiety.     [provider]  ibuprofen (ADVIL,MOTRIN) 800 MG tablet Take 800 mg by mouth 3 (three) times daily as needed for mild pain.  09/17/17   [provider]  insulin aspart (NOVOLOG) 100 UNIT/ML FlexPen Take 5 units before breakfast,   before lunch, and 5 units before dinner (5 units before each meal) Patient taking differently: Inject 10 Units into the skin 3 (three) times daily with meals.  03/30/17   Emokpae, Courage, MD  LEVEMIR FLEXTOUCH 100 UNIT/ML Pen 4 units Twice a day Patient taking differently: Inject 16 Units into the skin 2 (two) times daily.  03/30/17   Roxan Hockey, MD  lipase/protease/amylase (CREON) 36000 UNITS CPEP capsule TAKE 1 CAPSULE BY MOUTH 3  TIMES DAILY BEFORE MEALS 01/05/17   Rice, Resa Miner, MD  Multiple Vitamin (MULTIVITAMIN WITH MINERALS) TABS tablet Take 1 tablet by mouth daily. 03/30/17   Roxan Hockey, MD  naloxone [facility]) nasal spray 4 mg/0.1 mL Place 1 spray into the nose daily as needed (For overdose per paitent).  07/13/17   [provider]  omega-3 acid ethyl esters (LOVAZA) 1 g capsule Take 1 capsule (1 g total) by mouth daily. 10/15/16   Sid Falcon, MD  oxyCODONE-acetaminophen (PERCOCET) 10-325 MG tablet Take 1 tablet by mouth every 8 (eight) hours as needed for pain.    [provider]  pantoprazole (PROTONIX) 40 MG tablet Take 1 tablet (40 mg total) by mouth daily. 03/30/17   Roxan Hockey, MD  pravastatin (PRAVACHOL) 10 MG tablet Take 1 tablet (10 mg total) by mouth daily at 6 PM. 01/05/17   Rice, Resa Miner, MD  QUEtiapine Fumarate (SEROQUEL XR) 150 MG 24 hr tablet Take 150 mg by mouth at bedtime.  07/20/17   [provider]  thiamine (VITAMIN B-1) 100 MG tablet Take 1 tablet (100 mg total) by mouth daily. 03/30/17   Roxan Hockey, MD  Vitamin D, Ergocalciferol, (DRISDOL) 1.25 MG (50000 UNIT) CAPS capsule Take 50,000 Units by mouth once a week. 03/20/19   [provider]    Allergies    Ace inhibitors, Losartan, and Tylenol [acetaminophen]  Review of Systems   Review of Systems  Constitutional: Negative for appetite change and fatigue.  HENT: Negative for congestion, ear discharge and sinus pressure.   Eyes: Negative for discharge.  Respiratory: Negative for cough.   Cardiovascular: Negative for chest pain.  Gastrointestinal: Negative for abdominal pain and diarrhea.  Genitourinary: Negative for frequency and hematuria.  Musculoskeletal: Negative for back pain.  Skin: Negative for rash.  Neurological: Positive for weakness. Negative for seizures and headaches.  Psychiatric/Behavioral: Negative for hallucinations.    Physical Exam Updated Vital Signs BP 111/83 (BP Location: Left Arm)   Pulse 79   Temp 98.3 F (36.8 C)   Resp 12   SpO2 100%   Physical Exam Vitals and nursing note reviewed.  Constitutional:       Appearance: He is well-developed.  HENT:     Head: Normocephalic.     Nose: Nose normal.  Eyes:     General: No scleral icterus.    Conjunctiva/sclera: Conjunctivae normal.  Neck:     Thyroid: No thyromegaly.  Cardiovascular:     Rate and  Rhythm: Normal rate and regular rhythm.     Heart sounds: No murmur heard.  No friction rub. No gallop.   Pulmonary:     Breath sounds: No stridor. No wheezing or rales.  Chest:     Chest wall: No tenderness.  Abdominal:     General: There is no distension.     Tenderness: There is no abdominal tenderness. There is no rebound.  Musculoskeletal:        General: Normal range of motion.     Cervical back: Neck supple.  Lymphadenopathy:     Cervical: No cervical adenopathy.  Skin:    Findings: No erythema or rash.  Neurological:     Mental Status: He is alert and oriented to person, place, and time.     Motor: No abnormal muscle tone.     Coordination: Coordination normal.  Psychiatric:        Behavior: Behavior normal.     ED Results / Procedures / Treatments   Labs (all labs ordered are listed, but only abnormal results are displayed) Labs Reviewed  BASIC METABOLIC PANEL - Abnormal; Notable for the following components:      Result Value   Glucose, Bld 666 (*)    BUN <5 (*)    Creatinine, Ser 0.59 (*)    Calcium 8.4 (*)    All other components within normal limits  CBC - Abnormal; Notable for the following components:   Platelets 148 (*)    All other components within normal limits  CBG MONITORING, ED - Abnormal; Notable for the following components:   Glucose-Capillary 591 (*)    All other components within normal limits  URINALYSIS, ROUTINE W REFLEX MICROSCOPIC  CBG MONITORING, ED    EKG None  Radiology No results found.  Procedures Procedures (including critical care time)  Medications Ordered in ED Medications  sodium chloride 0.9 % bolus 1,000 mL (has no administration in time range)  insulin aspart (novoLOG)  injection 20 Units (has no administration in time range)  oxyCODONE-acetaminophen (PERCOCET/ROXICET) 5-325 MG per tablet 2 tablet (has no administration in time range)    ED Course  I have reviewed the triage vital signs and the nursing notes.  Pertinent labs & imaging results that were available during my care of the patient were reviewed by me and considered in my medical decision making (see chart for details).    MDM Rules/Calculators/A&P                          Patient with hyperglycemia and is not in DKA.  We will treat him with fluids and insulin        This patient presents to the ED for concern of weakness this involves an extensive number of treatment options, and is a complaint that carries with it a high risk of complications and morbidity.  The differential diagnosis includes hyperglycemia   Lab Tests:   I Ordered, reviewed, and interpreted labs, which included CBC and chemistries which show glucose of 666  Medicines ordered:   I ordered medication insulin and normal saline  Imaging Studies ordered:     Additional history obtained:   Additional history obtained from records  Previous records obtained and reviewed.  Consultations Obtained:   Reevaluation:  After the interventions stated above, I reevaluated the patient and found improved  Critical Interventions:  .   Final Clinical Impression(s) / ED Diagnoses Final diagnoses:  None    Rx /  DC Orders ED Discharge Orders    None       Milton Ferguson, MD 08/07/19 1547    Milton Ferguson, MD 08/08/19 [phone]

## 2019-08-07 NOTE — ED Provider Notes (Signed)
Care transferred to me.  His glucose is now 269 after the fluids and previously received subcutaneous insulin.  He feels well.  Vital signs are normal.  Discharge home with PCP follow-up.   Sherwood Gambler, MD 08/07/19 [phone]

## 2019-08-07 NOTE — ED Notes (Signed)
Discharge papers reviewed with pt. Pt expressed need for transportation home and also requesting food and lotion. Pt provided lotion and cheese and crackers.

## 2019-08-07 NOTE — Progress Notes (Signed)
CSW provided pt with transport via [facility].  Please reconsult if future social work needs arise.  CSW signing off, as social work intervention is no longer needed.  Alphonse Guild. Donita Newland  MSW, LCSW, LCAS, CCS Transitions of Care Clinical Social Worker Care Coordination Department Ph: [phone]

## 2019-08-07 NOTE — ED Triage Notes (Signed)
Per EMS-states he was at pain management's office-noticed he was lethargic, took CBG which was over 600-took insulin last night-increased urination and thirst-too sleepy to to take insulin this am

## 2019-08-14 ENCOUNTER — Encounter (HOSPITAL_COMMUNITY): Payer: Self-pay | Admitting: Emergency Medicine

## 2019-08-14 ENCOUNTER — Inpatient Hospital Stay (HOSPITAL_COMMUNITY): Payer: Medicare HMO

## 2019-08-14 ENCOUNTER — Emergency Department (HOSPITAL_COMMUNITY): Payer: Medicare HMO

## 2019-08-14 ENCOUNTER — Inpatient Hospital Stay (HOSPITAL_COMMUNITY)
Admission: EM | Admit: 2019-08-14 | Discharge: 2019-09-24 | DRG: 870 | Disposition: E | Payer: Medicare HMO | Attending: Pulmonary Disease | Admitting: Pulmonary Disease

## 2019-08-14 ENCOUNTER — Other Ambulatory Visit: Payer: Self-pay

## 2019-08-14 DIAGNOSIS — J9601 Acute respiratory failure with hypoxia: Secondary | ICD-10-CM

## 2019-08-14 DIAGNOSIS — R4182 Altered mental status, unspecified: Secondary | ICD-10-CM | POA: Diagnosis present

## 2019-08-14 DIAGNOSIS — J690 Pneumonitis due to inhalation of food and vomit: Secondary | ICD-10-CM | POA: Diagnosis present

## 2019-08-14 DIAGNOSIS — E1101 Type 2 diabetes mellitus with hyperosmolarity with coma: Secondary | ICD-10-CM | POA: Diagnosis not present

## 2019-08-14 DIAGNOSIS — B371 Pulmonary candidiasis: Secondary | ICD-10-CM | POA: Diagnosis not present

## 2019-08-14 DIAGNOSIS — E1165 Type 2 diabetes mellitus with hyperglycemia: Secondary | ICD-10-CM

## 2019-08-14 DIAGNOSIS — R54 Age-related physical debility: Secondary | ICD-10-CM | POA: Diagnosis present

## 2019-08-14 DIAGNOSIS — Z833 Family history of diabetes mellitus: Secondary | ICD-10-CM

## 2019-08-14 DIAGNOSIS — E872 Acidosis: Secondary | ICD-10-CM | POA: Diagnosis present

## 2019-08-14 DIAGNOSIS — E0811 Diabetes mellitus due to underlying condition with ketoacidosis with coma: Secondary | ICD-10-CM

## 2019-08-14 DIAGNOSIS — R6521 Severe sepsis with septic shock: Secondary | ICD-10-CM | POA: Diagnosis present

## 2019-08-14 DIAGNOSIS — E861 Hypovolemia: Secondary | ICD-10-CM | POA: Diagnosis present

## 2019-08-14 DIAGNOSIS — Z978 Presence of other specified devices: Secondary | ICD-10-CM

## 2019-08-14 DIAGNOSIS — E1142 Type 2 diabetes mellitus with diabetic polyneuropathy: Secondary | ICD-10-CM | POA: Diagnosis present

## 2019-08-14 DIAGNOSIS — R579 Shock, unspecified: Secondary | ICD-10-CM

## 2019-08-14 DIAGNOSIS — K860 Alcohol-induced chronic pancreatitis: Secondary | ICD-10-CM | POA: Diagnosis present

## 2019-08-14 DIAGNOSIS — R338 Other retention of urine: Secondary | ICD-10-CM | POA: Diagnosis present

## 2019-08-14 DIAGNOSIS — Z9114 Patient's other noncompliance with medication regimen: Secondary | ICD-10-CM | POA: Diagnosis not present

## 2019-08-14 DIAGNOSIS — Z8249 Family history of ischemic heart disease and other diseases of the circulatory system: Secondary | ICD-10-CM

## 2019-08-14 DIAGNOSIS — A419 Sepsis, unspecified organism: Secondary | ICD-10-CM | POA: Diagnosis present

## 2019-08-14 DIAGNOSIS — R471 Dysarthria and anarthria: Secondary | ICD-10-CM | POA: Diagnosis present

## 2019-08-14 DIAGNOSIS — R64 Cachexia: Secondary | ICD-10-CM | POA: Diagnosis present

## 2019-08-14 DIAGNOSIS — R0603 Acute respiratory distress: Secondary | ICD-10-CM

## 2019-08-14 DIAGNOSIS — F1011 Alcohol abuse, in remission: Secondary | ICD-10-CM

## 2019-08-14 DIAGNOSIS — G9341 Metabolic encephalopathy: Secondary | ICD-10-CM | POA: Diagnosis present

## 2019-08-14 DIAGNOSIS — N179 Acute kidney failure, unspecified: Secondary | ICD-10-CM | POA: Diagnosis present

## 2019-08-14 DIAGNOSIS — E1111 Type 2 diabetes mellitus with ketoacidosis with coma: Secondary | ICD-10-CM | POA: Diagnosis present

## 2019-08-14 DIAGNOSIS — Z9911 Dependence on respirator [ventilator] status: Secondary | ICD-10-CM | POA: Diagnosis not present

## 2019-08-14 DIAGNOSIS — G40401 Other generalized epilepsy and epileptic syndromes, not intractable, with status epilepticus: Secondary | ICD-10-CM | POA: Diagnosis present

## 2019-08-14 DIAGNOSIS — E878 Other disorders of electrolyte and fluid balance, not elsewhere classified: Secondary | ICD-10-CM | POA: Diagnosis present

## 2019-08-14 DIAGNOSIS — E871 Hypo-osmolality and hyponatremia: Secondary | ICD-10-CM | POA: Diagnosis present

## 2019-08-14 DIAGNOSIS — Z781 Physical restraint status: Secondary | ICD-10-CM

## 2019-08-14 DIAGNOSIS — I480 Paroxysmal atrial fibrillation: Secondary | ICD-10-CM | POA: Diagnosis present

## 2019-08-14 DIAGNOSIS — J80 Acute respiratory distress syndrome: Secondary | ICD-10-CM | POA: Diagnosis present

## 2019-08-14 DIAGNOSIS — E870 Hyperosmolality and hypernatremia: Secondary | ICD-10-CM | POA: Diagnosis present

## 2019-08-14 DIAGNOSIS — Z515 Encounter for palliative care: Secondary | ICD-10-CM | POA: Diagnosis not present

## 2019-08-14 DIAGNOSIS — R509 Fever, unspecified: Secondary | ICD-10-CM | POA: Diagnosis not present

## 2019-08-14 DIAGNOSIS — R1312 Dysphagia, oropharyngeal phase: Secondary | ICD-10-CM | POA: Diagnosis present

## 2019-08-14 DIAGNOSIS — R627 Adult failure to thrive: Secondary | ICD-10-CM

## 2019-08-14 DIAGNOSIS — I5030 Unspecified diastolic (congestive) heart failure: Secondary | ICD-10-CM | POA: Diagnosis not present

## 2019-08-14 DIAGNOSIS — E162 Hypoglycemia, unspecified: Secondary | ICD-10-CM

## 2019-08-14 DIAGNOSIS — Y95 Nosocomial condition: Secondary | ICD-10-CM | POA: Diagnosis not present

## 2019-08-14 DIAGNOSIS — Z66 Do not resuscitate: Secondary | ICD-10-CM

## 2019-08-14 DIAGNOSIS — R609 Edema, unspecified: Secondary | ICD-10-CM | POA: Diagnosis not present

## 2019-08-14 DIAGNOSIS — R680 Hypothermia, not associated with low environmental temperature: Secondary | ICD-10-CM | POA: Diagnosis not present

## 2019-08-14 DIAGNOSIS — J189 Pneumonia, unspecified organism: Secondary | ICD-10-CM | POA: Diagnosis not present

## 2019-08-14 DIAGNOSIS — L8990 Pressure ulcer of unspecified site, unspecified stage: Secondary | ICD-10-CM | POA: Insufficient documentation

## 2019-08-14 DIAGNOSIS — J15211 Pneumonia due to Methicillin susceptible Staphylococcus aureus: Secondary | ICD-10-CM | POA: Diagnosis not present

## 2019-08-14 DIAGNOSIS — Z4659 Encounter for fitting and adjustment of other gastrointestinal appliance and device: Secondary | ICD-10-CM

## 2019-08-14 DIAGNOSIS — I5032 Chronic diastolic (congestive) heart failure: Secondary | ICD-10-CM | POA: Diagnosis present

## 2019-08-14 DIAGNOSIS — Z7982 Long term (current) use of aspirin: Secondary | ICD-10-CM

## 2019-08-14 DIAGNOSIS — E876 Hypokalemia: Secondary | ICD-10-CM | POA: Diagnosis not present

## 2019-08-14 DIAGNOSIS — T380X5A Adverse effect of glucocorticoids and synthetic analogues, initial encounter: Secondary | ICD-10-CM | POA: Diagnosis not present

## 2019-08-14 DIAGNOSIS — J9690 Respiratory failure, unspecified, unspecified whether with hypoxia or hypercapnia: Secondary | ICD-10-CM | POA: Diagnosis not present

## 2019-08-14 DIAGNOSIS — L89152 Pressure ulcer of sacral region, stage 2: Secondary | ICD-10-CM | POA: Diagnosis not present

## 2019-08-14 DIAGNOSIS — B377 Candidal sepsis: Secondary | ICD-10-CM | POA: Diagnosis not present

## 2019-08-14 DIAGNOSIS — D6489 Other specified anemias: Secondary | ICD-10-CM | POA: Diagnosis not present

## 2019-08-14 DIAGNOSIS — Z9289 Personal history of other medical treatment: Secondary | ICD-10-CM

## 2019-08-14 DIAGNOSIS — J150 Pneumonia due to Klebsiella pneumoniae: Secondary | ICD-10-CM | POA: Diagnosis not present

## 2019-08-14 DIAGNOSIS — E785 Hyperlipidemia, unspecified: Secondary | ICD-10-CM | POA: Diagnosis present

## 2019-08-14 DIAGNOSIS — F209 Schizophrenia, unspecified: Secondary | ICD-10-CM | POA: Diagnosis present

## 2019-08-14 DIAGNOSIS — Z79899 Other long term (current) drug therapy: Secondary | ICD-10-CM

## 2019-08-14 DIAGNOSIS — R109 Unspecified abdominal pain: Secondary | ICD-10-CM

## 2019-08-14 DIAGNOSIS — Z7189 Other specified counseling: Secondary | ICD-10-CM | POA: Diagnosis not present

## 2019-08-14 DIAGNOSIS — R0602 Shortness of breath: Secondary | ICD-10-CM

## 2019-08-14 DIAGNOSIS — J9600 Acute respiratory failure, unspecified whether with hypoxia or hypercapnia: Secondary | ICD-10-CM | POA: Diagnosis not present

## 2019-08-14 DIAGNOSIS — R188 Other ascites: Secondary | ICD-10-CM | POA: Diagnosis present

## 2019-08-14 DIAGNOSIS — R29898 Other symptoms and signs involving the musculoskeletal system: Secondary | ICD-10-CM | POA: Diagnosis not present

## 2019-08-14 DIAGNOSIS — E11649 Type 2 diabetes mellitus with hypoglycemia without coma: Secondary | ICD-10-CM | POA: Diagnosis not present

## 2019-08-14 DIAGNOSIS — R571 Hypovolemic shock: Secondary | ICD-10-CM | POA: Diagnosis present

## 2019-08-14 DIAGNOSIS — K219 Gastro-esophageal reflux disease without esophagitis: Secondary | ICD-10-CM | POA: Diagnosis present

## 2019-08-14 DIAGNOSIS — E43 Unspecified severe protein-calorie malnutrition: Secondary | ICD-10-CM

## 2019-08-14 DIAGNOSIS — R569 Unspecified convulsions: Secondary | ICD-10-CM

## 2019-08-14 DIAGNOSIS — K567 Ileus, unspecified: Secondary | ICD-10-CM | POA: Diagnosis present

## 2019-08-14 DIAGNOSIS — Z681 Body mass index (BMI) 19 or less, adult: Secondary | ICD-10-CM

## 2019-08-14 DIAGNOSIS — F319 Bipolar disorder, unspecified: Secondary | ICD-10-CM | POA: Diagnosis present

## 2019-08-14 DIAGNOSIS — R0902 Hypoxemia: Secondary | ICD-10-CM

## 2019-08-14 DIAGNOSIS — E860 Dehydration: Secondary | ICD-10-CM | POA: Diagnosis present

## 2019-08-14 DIAGNOSIS — E559 Vitamin D deficiency, unspecified: Secondary | ICD-10-CM | POA: Diagnosis present

## 2019-08-14 DIAGNOSIS — N471 Phimosis: Secondary | ICD-10-CM | POA: Diagnosis present

## 2019-08-14 DIAGNOSIS — K7460 Unspecified cirrhosis of liver: Secondary | ICD-10-CM | POA: Diagnosis present

## 2019-08-14 DIAGNOSIS — Z8619 Personal history of other infectious and parasitic diseases: Secondary | ICD-10-CM

## 2019-08-14 DIAGNOSIS — L89899 Pressure ulcer of other site, unspecified stage: Secondary | ICD-10-CM | POA: Diagnosis not present

## 2019-08-14 DIAGNOSIS — I4891 Unspecified atrial fibrillation: Secondary | ICD-10-CM | POA: Diagnosis not present

## 2019-08-14 DIAGNOSIS — D6959 Other secondary thrombocytopenia: Secondary | ICD-10-CM | POA: Diagnosis present

## 2019-08-14 DIAGNOSIS — Z0189 Encounter for other specified special examinations: Secondary | ICD-10-CM

## 2019-08-14 DIAGNOSIS — Z01818 Encounter for other preprocedural examination: Secondary | ICD-10-CM

## 2019-08-14 DIAGNOSIS — Z20822 Contact with and (suspected) exposure to covid-19: Secondary | ICD-10-CM | POA: Diagnosis present

## 2019-08-14 DIAGNOSIS — M7989 Other specified soft tissue disorders: Secondary | ICD-10-CM | POA: Diagnosis not present

## 2019-08-14 DIAGNOSIS — Z794 Long term (current) use of insulin: Secondary | ICD-10-CM

## 2019-08-14 DIAGNOSIS — F17210 Nicotine dependence, cigarettes, uncomplicated: Secondary | ICD-10-CM | POA: Diagnosis present

## 2019-08-14 DIAGNOSIS — Z888 Allergy status to other drugs, medicaments and biological substances status: Secondary | ICD-10-CM

## 2019-08-14 LAB — BASIC METABOLIC PANEL
Anion gap: 11 (ref 5–15)
BUN: <5 mg/dL — ABNORMAL LOW (ref 8–23)
CO2: 18 mmol/L — ABNORMAL LOW (ref 22–32)
Calcium: 7.8 mg/dL — ABNORMAL LOW (ref 8.9–10.3)
Chloride: 118 mmol/L — ABNORMAL HIGH (ref 98–111)
Creatinine, Ser: 0.99 mg/dL (ref 0.61–1.24)
GFR calc Af Amer: >60 mL/min (ref >60)
GFR calc non Af Amer: >60 mL/min (ref >60)
Glucose, Bld: 866 mg/dL (ref 70–99)
Potassium: <2 mmol/L — CL (ref 3.5–5.1)
Sodium: 147 mmol/L — ABNORMAL HIGH (ref 135–145)

## 2019-08-14 LAB — COMPREHENSIVE METABOLIC PANEL
ALT: 143 U/L — ABNORMAL HIGH (ref 0–44)
ALT: 128 U/L — ABNORMAL HIGH (ref 0–44)
AST: 46 U/L — ABNORMAL HIGH (ref 15–41)
AST: 46 U/L — ABNORMAL HIGH (ref 15–41)
Albumin: 2.9 g/dL — ABNORMAL LOW (ref 3.5–5.0)
Albumin: 2.5 g/dL — ABNORMAL LOW (ref 3.5–5.0)
Alkaline Phosphatase: 377 U/L — ABNORMAL HIGH (ref 38–126)
Alkaline Phosphatase: 308 U/L — ABNORMAL HIGH (ref 38–126)
Anion gap: 23 — ABNORMAL HIGH (ref 5–15)
Anion gap: 6 (ref 5–15)
BUN: <5 mg/dL — ABNORMAL LOW (ref 8–23)
BUN: <5 mg/dL — ABNORMAL LOW (ref 8–23)
CO2: 13 mmol/L — ABNORMAL LOW (ref 22–32)
CO2: 16 mmol/L — ABNORMAL LOW (ref 22–32)
Calcium: 9 mg/dL (ref 8.9–10.3)
Calcium: 7.8 mg/dL — ABNORMAL LOW (ref 8.9–10.3)
Chloride: 102 mmol/L (ref 98–111)
Chloride: 110 mmol/L (ref 98–111)
Creatinine, Ser: 1.28 mg/dL — ABNORMAL HIGH (ref 0.61–1.24)
Creatinine, Ser: 1.15 mg/dL (ref 0.61–1.24)
GFR calc Af Amer: >60 mL/min (ref >60)
GFR calc Af Amer: >60 mL/min (ref >60)
GFR calc non Af Amer: >60 mL/min (ref >60)
GFR calc non Af Amer: >60 mL/min (ref >60)
Glucose, Bld: 1485 mg/dL (ref 70–99)
Glucose, Bld: 1073 mg/dL (ref 70–99)
Potassium: <2 mmol/L — CL (ref 3.5–5.1)
Potassium: >7.5 mmol/L (ref 3.5–5.1)
Sodium: 138 mmol/L (ref 135–145)
Sodium: 132 mmol/L — ABNORMAL LOW (ref 135–145)
Total Bilirubin: 0.8 mg/dL (ref 0.3–1.2)
Total Bilirubin: 0.6 mg/dL (ref 0.3–1.2)
Total Protein: 5.3 g/dL — ABNORMAL LOW (ref 6.5–8.1)
Total Protein: 4.7 g/dL — ABNORMAL LOW (ref 6.5–8.1)

## 2019-08-14 LAB — URINALYSIS, COMPLETE (UACMP) WITH MICROSCOPIC
Bilirubin Urine: NEGATIVE
Glucose, UA: >=500 mg/dL — AB
Ketones, ur: NEGATIVE mg/dL
Leukocytes,Ua: NEGATIVE
Nitrite: NEGATIVE
Protein, ur: >=300 mg/dL — AB
Specific Gravity, Urine: 1.024 (ref 1.005–1.030)
pH: 6 (ref 5.0–8.0)

## 2019-08-14 LAB — CBC WITH DIFFERENTIAL/PLATELET
Abs Immature Granulocytes: 0.13 10*3/uL — ABNORMAL HIGH (ref 0.00–0.07)
Basophils Absolute: 0 10*3/uL (ref 0.0–0.1)
Basophils Relative: 0 %
Eosinophils Absolute: 0 10*3/uL (ref 0.0–0.5)
Eosinophils Relative: 0 %
HCT: 44.9 % (ref 39.0–52.0)
Hemoglobin: 14.2 g/dL (ref 13.0–17.0)
Immature Granulocytes: 1 %
Lymphocytes Relative: 5 %
Lymphs Abs: 1.1 10*3/uL (ref 0.7–4.0)
MCH: 31.7 pg (ref 26.0–34.0)
MCHC: 31.6 g/dL (ref 30.0–36.0)
MCV: 100.2 fL — ABNORMAL HIGH (ref 80.0–100.0)
Monocytes Absolute: 0.8 10*3/uL (ref 0.1–1.0)
Monocytes Relative: 3 %
Neutro Abs: 20.3 10*3/uL — ABNORMAL HIGH (ref 1.7–7.7)
Neutrophils Relative %: 91 %
Platelets: 156 10*3/uL (ref 150–400)
RBC: 4.48 MIL/uL (ref 4.22–5.81)
RDW: 14.6 % (ref 11.5–15.5)
WBC: 22.3 10*3/uL — ABNORMAL HIGH (ref 4.0–10.5)
nRBC: 0 % (ref 0.0–0.2)

## 2019-08-14 LAB — CBG MONITORING, ED
Glucose-Capillary: >600 mg/dL (ref 70–99)
Glucose-Capillary: >600 mg/dL (ref 70–99)
Glucose-Capillary: >600 mg/dL (ref 70–99)
Glucose-Capillary: >600 mg/dL (ref 70–99)

## 2019-08-14 LAB — GLUCOSE, CAPILLARY
Glucose-Capillary: 283 mg/dL — ABNORMAL HIGH (ref 70–99)
Glucose-Capillary: 327 mg/dL — ABNORMAL HIGH (ref 70–99)
Glucose-Capillary: 361 mg/dL — ABNORMAL HIGH (ref 70–99)
Glucose-Capillary: 506 mg/dL (ref 70–99)
Glucose-Capillary: 570 mg/dL (ref 70–99)
Glucose-Capillary: 574 mg/dL (ref 70–99)
Glucose-Capillary: 584 mg/dL (ref 70–99)
Glucose-Capillary: >600 mg/dL (ref 70–99)
Glucose-Capillary: >600 mg/dL (ref 70–99)
Glucose-Capillary: >600 mg/dL (ref 70–99)
Glucose-Capillary: >600 mg/dL (ref 70–99)
Glucose-Capillary: >600 mg/dL (ref 70–99)
Glucose-Capillary: >600 mg/dL (ref 70–99)

## 2019-08-14 LAB — I-STAT ARTERIAL BLOOD GAS, ED
Acid-base deficit: 14 mmol/L — ABNORMAL HIGH (ref 0.0–2.0)
Acid-base deficit: 13 mmol/L — ABNORMAL HIGH (ref 0.0–2.0)
Bicarbonate: 15.2 mmol/L — ABNORMAL LOW (ref 20.0–28.0)
Bicarbonate: 14.8 mmol/L — ABNORMAL LOW (ref 20.0–28.0)
Calcium, Ion: 1.29 mmol/L (ref 1.15–1.40)
Calcium, Ion: 1.37 mmol/L (ref 1.15–1.40)
HCT: 44 % (ref 39.0–52.0)
HCT: 43 % (ref 39.0–52.0)
Hemoglobin: 15 g/dL (ref 13.0–17.0)
Hemoglobin: 14.6 g/dL (ref 13.0–17.0)
O2 Saturation: 100 %
O2 Saturation: 95 %
Patient temperature: 90.1
Potassium: <2 mmol/L — CL (ref 3.5–5.1)
Potassium: <2 mmol/L — CL (ref 3.5–5.1)
Sodium: 140 mmol/L (ref 135–145)
Sodium: 147 mmol/L — ABNORMAL HIGH (ref 135–145)
TCO2: 17 mmol/L — ABNORMAL LOW (ref 22–32)
TCO2: 16 mmol/L — ABNORMAL LOW (ref 22–32)
pCO2 arterial: 47.3 mmHg (ref 32.0–48.0)
pCO2 arterial: 33.6 mmHg (ref 32.0–48.0)
pH, Arterial: 7.114 — CL (ref 7.350–7.450)
pH, Arterial: 7.223 — ABNORMAL LOW (ref 7.350–7.450)
pO2, Arterial: 419 mmHg — ABNORMAL HIGH (ref 83.0–108.0)
pO2, Arterial: 70 mmHg — ABNORMAL LOW (ref 83.0–108.0)

## 2019-08-14 LAB — CBC
HCT: 40.5 % (ref 39.0–52.0)
Hemoglobin: 13.3 g/dL (ref 13.0–17.0)
MCH: 31.6 pg (ref 26.0–34.0)
MCHC: 32.8 g/dL (ref 30.0–36.0)
MCV: 96.2 fL (ref 80.0–100.0)
Platelets: 120 10*3/uL — ABNORMAL LOW (ref 150–400)
RBC: 4.21 MIL/uL — ABNORMAL LOW (ref 4.22–5.81)
RDW: 14.1 % (ref 11.5–15.5)
WBC: 15.3 10*3/uL — ABNORMAL HIGH (ref 4.0–10.5)
nRBC: 0 % (ref 0.0–0.2)

## 2019-08-14 LAB — RAPID URINE DRUG SCREEN, HOSP PERFORMED
Amphetamines: NOT DETECTED
Barbiturates: NOT DETECTED
Benzodiazepines: NOT DETECTED
Cocaine: NOT DETECTED
Opiates: NOT DETECTED
Tetrahydrocannabinol: NOT DETECTED

## 2019-08-14 LAB — URINALYSIS, ROUTINE W REFLEX MICROSCOPIC
Bilirubin Urine: NEGATIVE
Glucose, UA: >=500 mg/dL — AB
Ketones, ur: NEGATIVE mg/dL
Leukocytes,Ua: NEGATIVE
Nitrite: NEGATIVE
Protein, ur: NEGATIVE mg/dL
Specific Gravity, Urine: 1.025 (ref 1.005–1.030)
pH: 6 (ref 5.0–8.0)

## 2019-08-14 LAB — I-STAT VENOUS BLOOD GAS, ED
Acid-base deficit: 15 mmol/L — ABNORMAL HIGH (ref 0.0–2.0)
Bicarbonate: 16.5 mmol/L — ABNORMAL LOW (ref 20.0–28.0)
Calcium, Ion: 1.27 mmol/L (ref 1.15–1.40)
HCT: 46 % (ref 39.0–52.0)
Hemoglobin: 15.6 g/dL (ref 13.0–17.0)
O2 Saturation: 82 %
Potassium: <2 mmol/L — CL (ref 3.5–5.1)
Sodium: 142 mmol/L (ref 135–145)
TCO2: 18 mmol/L — ABNORMAL LOW (ref 22–32)
pCO2, Ven: 63.7 mmHg — ABNORMAL HIGH (ref 44.0–60.0)
pH, Ven: 7.02 — CL (ref 7.250–7.430)
pO2, Ven: 69 mmHg — ABNORMAL HIGH (ref 32.0–45.0)

## 2019-08-14 LAB — PROCALCITONIN: Procalcitonin: 0.99 ng/mL

## 2019-08-14 LAB — POCT I-STAT 7, (LYTES, BLD GAS, ICA,H+H)
Acid-base deficit: 13 mmol/L — ABNORMAL HIGH (ref 0.0–2.0)
Bicarbonate: 15.6 mmol/L — ABNORMAL LOW (ref 20.0–28.0)
Calcium, Ion: 1.34 mmol/L (ref 1.15–1.40)
HCT: 43 % (ref 39.0–52.0)
Hemoglobin: 14.6 g/dL (ref 13.0–17.0)
O2 Saturation: 97 %
Patient temperature: 97.5
Potassium: <2 mmol/L — CL (ref 3.5–5.1)
Sodium: 153 mmol/L — ABNORMAL HIGH (ref 135–145)
TCO2: 17 mmol/L — ABNORMAL LOW (ref 22–32)
pCO2 arterial: 42.6 mmHg (ref 32.0–48.0)
pH, Arterial: 7.167 — CL (ref 7.350–7.450)
pO2, Arterial: 116 mmHg — ABNORMAL HIGH (ref 83.0–108.0)

## 2019-08-14 LAB — PROTIME-INR
INR: 1.3 — ABNORMAL HIGH (ref 0.8–1.2)
INR: 1.3 — ABNORMAL HIGH (ref 0.8–1.2)
Prothrombin Time: 15.7 seconds — ABNORMAL HIGH (ref 11.4–15.2)
Prothrombin Time: 16 seconds — ABNORMAL HIGH (ref 11.4–15.2)

## 2019-08-14 LAB — CORTISOL: Cortisol, Plasma: 63.5 ug/dL

## 2019-08-14 LAB — MAGNESIUM
Magnesium: 1.8 mg/dL (ref 1.7–2.4)
Magnesium: 1.4 mg/dL — ABNORMAL LOW (ref 1.7–2.4)

## 2019-08-14 LAB — BETA-HYDROXYBUTYRIC ACID
Beta-Hydroxybutyric Acid: 0.11 mmol/L (ref 0.05–0.27)
Beta-Hydroxybutyric Acid: 0.06 mmol/L (ref 0.05–0.27)

## 2019-08-14 LAB — APTT
aPTT: 32 seconds (ref 24–36)
aPTT: 32 seconds (ref 24–36)

## 2019-08-14 LAB — LIPASE, BLOOD: Lipase: 19 U/L (ref 11–51)

## 2019-08-14 LAB — LACTIC ACID, PLASMA
Lactic Acid, Venous: >11 mmol/L (ref 0.5–1.9)
Lactic Acid, Venous: 10.7 mmol/L (ref 0.5–1.9)
Lactic Acid, Venous: 7.7 mmol/L (ref 0.5–1.9)

## 2019-08-14 LAB — TROPONIN I (HIGH SENSITIVITY)
Troponin I (High Sensitivity): 24 ng/L — ABNORMAL HIGH (ref <18)
Troponin I (High Sensitivity): 39 ng/L — ABNORMAL HIGH (ref <18)

## 2019-08-14 LAB — ETHANOL: Alcohol, Ethyl (B): <10 mg/dL (ref <10)

## 2019-08-14 LAB — STREP PNEUMONIAE URINARY ANTIGEN: Strep Pneumo Urinary Antigen: NEGATIVE

## 2019-08-14 LAB — PHOSPHORUS: Phosphorus: 1.2 mg/dL — ABNORMAL LOW (ref 2.5–4.6)

## 2019-08-14 LAB — TSH: TSH: 0.487 u[IU]/mL (ref 0.350–4.500)

## 2019-08-14 LAB — SARS CORONAVIRUS 2 BY RT PCR (HOSPITAL ORDER, PERFORMED IN ~~LOC~~ HOSPITAL LAB): SARS Coronavirus 2: NEGATIVE

## 2019-08-14 LAB — AMYLASE: Amylase: 383 U/L — ABNORMAL HIGH (ref 28–100)

## 2019-08-14 LAB — MRSA PCR SCREENING: MRSA by PCR: NEGATIVE

## 2019-08-14 MED ORDER — SODIUM CHLORIDE 0.9 % IV SOLN
2.0000 g | Freq: Two times a day (BID) | INTRAVENOUS | Status: DC
  Administered 2019-08-14 – 2019-08-17 (×6): 2 g via INTRAVENOUS
  Filled 2019-08-14 (×6): qty 2

## 2019-08-14 MED ORDER — DEXTROSE 50 % IV SOLN
0.0000 mL | INTRAVENOUS | Status: DC | PRN
  Administered 2019-08-17: 25 mL via INTRAVENOUS
  Filled 2019-08-14 (×2): qty 50

## 2019-08-14 MED ORDER — PHENYLEPHRINE 40 MCG/ML (10ML) SYRINGE FOR IV PUSH (FOR BLOOD PRESSURE SUPPORT)
PREFILLED_SYRINGE | INTRAVENOUS | Status: AC
  Filled 2019-08-14: qty 10

## 2019-08-14 MED ORDER — CHLORHEXIDINE GLUCONATE CLOTH 2 % EX PADS: 6.0000 | MEDICATED_PAD | Freq: Every day | CUTANEOUS | Status: DC

## 2019-08-14 MED ORDER — MIDAZOLAM HCL 2 MG/2ML IJ SOLN
INTRAMUSCULAR | Status: AC
  Administered 2019-08-14: 2 mg via INTRAVENOUS
  Filled 2019-08-14: qty 2

## 2019-08-14 MED ORDER — ROCURONIUM BROMIDE 50 MG/5ML IV SOLN
75.0000 mg | Freq: Once | INTRAVENOUS | Status: AC
  Administered 2019-08-14: 70 mg via INTRAVENOUS
  Filled 2019-08-14: qty 7.5

## 2019-08-14 MED ORDER — FENTANYL CITRATE (PF) 100 MCG/2ML IJ SOLN
INTRAMUSCULAR | Status: AC
  Administered 2019-08-14: 100 ug via INTRAVENOUS
  Filled 2019-08-14: qty 2

## 2019-08-14 MED ORDER — DOCUSATE SODIUM 100 MG PO CAPS: 100.0000 mg | ORAL_CAPSULE | Freq: Two times a day (BID) | ORAL | Status: DC | PRN

## 2019-08-14 MED ORDER — SODIUM CHLORIDE 0.9 % IV SOLN: INTRAVENOUS | Status: DC

## 2019-08-14 MED ORDER — MIDAZOLAM HCL 2 MG/2ML IJ SOLN: 2.0000 mg | INTRAMUSCULAR | Status: DC | PRN

## 2019-08-14 MED ORDER — VANCOMYCIN HCL 1250 MG/250ML IV SOLN
1250.0000 mg | Freq: Once | INTRAVENOUS | Status: AC
  Administered 2019-08-14: 1250 mg via INTRAVENOUS
  Filled 2019-08-14: qty 250

## 2019-08-14 MED ORDER — FENTANYL CITRATE (PF) 100 MCG/2ML IJ SOLN
50.0000 ug | INTRAMUSCULAR | Status: DC | PRN
  Administered 2019-08-15: 50 ug via INTRAVENOUS
  Filled 2019-08-14: qty 2

## 2019-08-14 MED ORDER — NALOXONE HCL 2 MG/2ML IJ SOSY
PREFILLED_SYRINGE | INTRAMUSCULAR | Status: AC | PRN
  Administered 2019-08-14: 2 mg via INTRAVENOUS

## 2019-08-14 MED ORDER — POTASSIUM CHLORIDE 10 MEQ/100ML IV SOLN
10.0000 meq | INTRAVENOUS | Status: AC
  Administered 2019-08-14 (×3): 10 meq via INTRAVENOUS
  Filled 2019-08-14: qty 100

## 2019-08-14 MED ORDER — DOCUSATE SODIUM 50 MG/5ML PO LIQD
100.0000 mg | Freq: Two times a day (BID) | ORAL | Status: DC
  Administered 2019-08-15 – 2019-08-18 (×3): 100 mg
  Filled 2019-08-14 (×3): qty 10

## 2019-08-14 MED ORDER — LEVETIRACETAM IN NACL 500 MG/100ML IV SOLN
500.0000 mg | Freq: Two times a day (BID) | INTRAVENOUS | Status: DC
  Administered 2019-08-14 – 2019-08-18 (×9): 500 mg via INTRAVENOUS
  Filled 2019-08-14 (×10): qty 100

## 2019-08-14 MED ORDER — POTASSIUM CHLORIDE 10 MEQ/100ML IV SOLN
10.0000 meq | INTRAVENOUS | Status: DC
  Administered 2019-08-14 (×2): 10 meq via INTRAVENOUS
  Filled 2019-08-14: qty 100

## 2019-08-14 MED ORDER — INSULIN REGULAR(HUMAN) IN NACL 100-0.9 UT/100ML-% IV SOLN
INTRAVENOUS | Status: DC
  Administered 2019-08-14: 5.5 [IU]/h via INTRAVENOUS
  Filled 2019-08-14 (×2): qty 100

## 2019-08-14 MED ORDER — MIDAZOLAM HCL 2 MG/2ML IJ SOLN: 2.0000 mg | Freq: Once | INTRAMUSCULAR | Status: AC

## 2019-08-14 MED ORDER — NOREPINEPHRINE 4 MG/250ML-% IV SOLN
INTRAVENOUS | Status: AC
  Filled 2019-08-14: qty 250

## 2019-08-14 MED ORDER — PANTOPRAZOLE SODIUM 40 MG IV SOLR
40.0000 mg | Freq: Every day | INTRAVENOUS | Status: DC
  Administered 2019-08-14 – 2019-08-15 (×2): 40 mg via INTRAVENOUS
  Filled 2019-08-14 (×2): qty 40

## 2019-08-14 MED ORDER — POTASSIUM CHLORIDE 10 MEQ/100ML IV SOLN
10.0000 meq | INTRAVENOUS | Status: DC
  Administered 2019-08-14: 10 meq via INTRAVENOUS
  Filled 2019-08-14: qty 100

## 2019-08-14 MED ORDER — DEXTROSE-NACL 5-0.45 % IV SOLN: INTRAVENOUS | Status: DC

## 2019-08-14 MED ORDER — SODIUM CHLORIDE 0.9 % IV SOLN
2.0000 g | Freq: Once | INTRAVENOUS | Status: AC
  Administered 2019-08-14: 2 g via INTRAVENOUS
  Filled 2019-08-14: qty 2

## 2019-08-14 MED ORDER — SODIUM CHLORIDE 0.9 % IV BOLUS
1000.0000 mL | INTRAVENOUS | Status: AC
  Administered 2019-08-14 (×2): 1000 mL via INTRAVENOUS

## 2019-08-14 MED ORDER — ORAL CARE MOUTH RINSE
15.0000 mL | OROMUCOSAL | Status: DC
  Administered 2019-08-14 – 2019-08-15 (×11): 15 mL via OROMUCOSAL

## 2019-08-14 MED ORDER — PANTOPRAZOLE SODIUM 40 MG IV SOLR: 40.0000 mg | Freq: Every day | INTRAVENOUS | Status: DC

## 2019-08-14 MED ORDER — POLYETHYLENE GLYCOL 3350 17 G PO PACK
17.0000 g | PACK | Freq: Every day | ORAL | Status: DC
  Administered 2019-08-15: 17 g
  Filled 2019-08-14: qty 1

## 2019-08-14 MED ORDER — FENTANYL CITRATE (PF) 100 MCG/2ML IJ SOLN
50.0000 ug | INTRAMUSCULAR | Status: DC | PRN
  Administered 2019-08-14: 50 ug via INTRAVENOUS
  Administered 2019-08-14 – 2019-08-15 (×2): 100 ug via INTRAVENOUS
  Filled 2019-08-14 (×4): qty 2

## 2019-08-14 MED ORDER — FENTANYL CITRATE (PF) 100 MCG/2ML IJ SOLN: 100.0000 ug | Freq: Once | INTRAMUSCULAR | Status: AC

## 2019-08-14 MED ORDER — VANCOMYCIN HCL IN DEXTROSE 1-5 GM/200ML-% IV SOLN: 1000.0000 mg | Freq: Once | INTRAVENOUS | Status: DC

## 2019-08-14 MED ORDER — SODIUM CHLORIDE 0.45 % IV SOLN: INTRAVENOUS | Status: DC

## 2019-08-14 MED ORDER — PHENYLEPHRINE 40 MCG/ML (10ML) SYRINGE FOR IV PUSH (FOR BLOOD PRESSURE SUPPORT)
PREFILLED_SYRINGE | INTRAVENOUS | Status: AC
  Administered 2019-08-14: 200 ug
  Filled 2019-08-14: qty 10

## 2019-08-14 MED ORDER — NOREPINEPHRINE 4 MG/250ML-% IV SOLN
0.0000 ug/min | INTRAVENOUS | Status: DC
  Administered 2019-08-14: 10 ug/min via INTRAVENOUS
  Administered 2019-08-15: 17 ug/min via INTRAVENOUS
  Administered 2019-08-15: 16 ug/min via INTRAVENOUS
  Administered 2019-08-15: 13 ug/min via INTRAVENOUS
  Filled 2019-08-14 (×4): qty 250

## 2019-08-14 MED ORDER — POTASSIUM CHLORIDE 10 MEQ/100ML IV SOLN: 10.0000 meq | INTRAVENOUS | Status: DC

## 2019-08-14 MED ORDER — POTASSIUM CHLORIDE 10 MEQ/50ML IV SOLN
10.0000 meq | INTRAVENOUS | Status: AC
  Administered 2019-08-14 (×5): 10 meq via INTRAVENOUS
  Filled 2019-08-14 (×5): qty 50

## 2019-08-14 MED ORDER — POLYETHYLENE GLYCOL 3350 17 G PO PACK: 17.0000 g | PACK | Freq: Every day | ORAL | Status: DC | PRN

## 2019-08-14 MED ORDER — PROPOFOL 1000 MG/100ML IV EMUL: 0.0000 ug/kg/min | INTRAVENOUS | Status: DC

## 2019-08-14 MED ORDER — LEVETIRACETAM IN NACL 1000 MG/100ML IV SOLN
1000.0000 mg | Freq: Once | INTRAVENOUS | Status: AC
  Administered 2019-08-14: 1000 mg via INTRAVENOUS
  Filled 2019-08-14: qty 100

## 2019-08-14 MED ORDER — CHLORHEXIDINE GLUCONATE 0.12% ORAL RINSE (MEDLINE KIT)
15.0000 mL | Freq: Two times a day (BID) | OROMUCOSAL | Status: DC
  Administered 2019-08-14 – 2019-08-15 (×2): 15 mL via OROMUCOSAL

## 2019-08-14 MED ORDER — VANCOMYCIN HCL 500 MG/100ML IV SOLN
500.0000 mg | Freq: Two times a day (BID) | INTRAVENOUS | Status: DC
  Administered 2019-08-14: 500 mg via INTRAVENOUS
  Filled 2019-08-14 (×2): qty 100

## 2019-08-14 MED ORDER — SODIUM CHLORIDE 0.9 % IV BOLUS
1000.0000 mL | INTRAVENOUS | Status: AC
  Administered 2019-08-14: 1000 mL via INTRAVENOUS

## 2019-08-14 NOTE — ED Notes (Signed)
Hold abx for Keepra per neuro.

## 2019-08-14 NOTE — Progress Notes (Signed)
EEG completed, results pending. 

## 2019-08-14 NOTE — Consult Note (Addendum)
Requesting Physician: Dr. Ashok Cordia    Chief Complaint:  Seizure   History obtained from: Patient and Chart    HPI:                                                                                                                                       Michael [name] is a 64 y.o. male with insulin-dependent diabetes presents with multiple seizures and altered mental status in the setting of severe hyperglycemia/DKA with glucose >1000.  History obtained by EDP, EMS was called for altered mental status and patient had generalized tonic-clonic seizure and 10 mg of Versed was given by EMS.  On arrival to [facility] patient obtunded, no longer having seizure activity.  Blood glucose >1400, severely acidotic and hypokalemic and in respiratory distress.  Patient started on insulin drip and antibiotics ordered for possible sepsis.  Neurology was consulted for seizures.   Past Medical History:  Diagnosis Date  . Anxiety   . Arthritis    "joints; shoulders; feet" (06/08/2016)  . Bipolar disorder ([place])   . Daily headache    "7:30 - 8:00 q night" (06/08/2016)  . Diabetic peripheral neuropathy ([place])   . GERD (gastroesophageal reflux disease)   . History of hepatitis B virus infection conferring immunity 03/22/2018  . Pancreatitis   . Schizophrenia ([place])   . Seizures ([place]) 01/2016 X 2  . Type II diabetes mellitus ([place])     Past Surgical History:  Procedure Laterality Date  . BALLOON DILATION  03/22/2011   Procedure: BALLOON DILATION;  Surgeon: Gatha Mayer, MD;  [facility];  Service: Endoscopy;  Laterality: N/A;  . COLONOSCOPY N/A 05/22/2012   Procedure: COLONOSCOPY;  Surgeon: Gatha Mayer, MD;  Location: [facility];  Service: Endoscopy;  Laterality: N/A;  . COLONOSCOPY W/ BIOPSIES AND POLYPECTOMY  09/12/11  . ESOPHAGOGASTRODUODENOSCOPY  03/22/2011   Procedure: ESOPHAGOGASTRODUODENOSCOPY (EGD);  Surgeon: Gatha Mayer, MD;  [facility];  Service: Endoscopy;  Laterality: N/A;   egd with balloon   . EUS  04/20/2011   Procedure: UPPER ENDOSCOPIC ULTRASOUND (EUS) LINEAR;  Surgeon: Milus Banister, MD;  [facility];  Service: Endoscopy;  Laterality: N/A;  . KNEE ARTHROSCOPY Left     Family History  Problem Relation Age of Onset  . Hypertension Sister   . Diabetes Sister   . Heart disease Sister   . Colon cancer Neg Hx   . Stomach cancer Neg Hx   . Anesthesia problems Neg Hx   . Hypotension Neg Hx   . Pseudochol deficiency Neg Hx   . Malignant hyperthermia Neg Hx    Social History:  reports that he has been smoking cigarettes. He has a 21.00 pack-year smoking history. He has never used smokeless tobacco. He reports current alcohol use of about 2.0 standard drinks of alcohol per week. He reports previous drug use. Drug: Marijuana.  Allergies:  Allergies  Allergen Reactions  . Ace Inhibitors Swelling and Other (See Comments)    Angioedema - unquestionable  . Losartan Swelling and Other (See Comments)    Pt presented with unquestionable angioedema on ACEIs (Angiotensin-converting enzyme (ACE) inhibitors) so aviod ARBs (Angiotensin receptor blockers), as well  . Tylenol [Acetaminophen] Other (See Comments)    "cannot have this" (takes Percocet at home)    Medications:                                                                                                                          ROS:                                                                                                                                     Unable to review systems due to patient's mental status   Examination:                                                                                                      General: Appears well-developed and well-nourished.  Psych: Affect appropriate to situation Eyes: No scleral injection HENT: No OP obstrucion Head: Normocephalic.  Cardiovascular: Normal rate and regular rhythm.  Respiratory: Labored respirations GI: Soft.   No distension. There is no tenderness.  Skin: WDI    Neurological Examination Mental Status: Obtunded, does not respond to deep sternal rub.  Does not follow commands.  No verbalizations Cranial Nerves: II: patient does not respond confrontation bilaterally, pupils are equal and reactive III,IV,VI: Doll's present, does not track.  No forced gaze deviation V,VII: corneal reflex: Present VIII: patient does not respond to verbal stimuli IX,X: gag reflex present XI: trapezius strength unable to test bilaterally XII: tongue strength unable to test Motor: Extremities flaccid throughout.  Sensory: Does not respond to noxious stimuli in any extremity. Plantars: Flexor bilaterally Cerebellar: Unable to perform     Lab Results: Basic Metabolic Panel: Recent Labs  Lab 08/07/19 1324 08/01/2019 0830 08/06/2019 1950  NA 137 138 142  K 3.5 <2.0* <2.0*  CL 100 102  --   CO2 24 13*  --   GLUCOSE 666* 1,485*  --   BUN <5* <5*  --   CREATININE 0.59* 1.28*  --   CALCIUM 8.4* 9.0  --   MG  --  1.8  --     CBC: Recent Labs  Lab 08/07/19 1324 08/13/2019 0830 08/18/2019 0852  WBC 7.5 22.3*  --   NEUTROABS  --  20.3*  --   HGB 14.2 14.2 15.6  HCT 40.4 44.9 46.0  MCV 91.8 100.2*  --   PLT 148* 156  --     Coagulation Studies: Recent Labs    08/06/2019 0830  LABPROT 15.7*  INR 1.3*    Imaging: DG Chest Port 1 View  Result Date: 08/21/2019 CLINICAL DATA:  SOB,weakness EXAM: PORTABLE CHEST 1 VIEW COMPARISON:  Chest radiograph 06/09/2019 FINDINGS: Stable cardiomediastinal contours. Low lung volumes. Possible subtle opacity in the right lower lung. The left lung is clear. No pneumothorax or significant pleural effusion. Large area of lucency in the left upper quadrant possibly representing gastric distention. No acute finding in the visualized skeleton. IMPRESSION: 1. Possible subtle opacity in the right lower lung. Evaluation somewhat limited by low lung volumes. 2. Large area of lucency  in the left upper quadrant possibly representing gastric distention. Recommend dedicated abdominal radiographs. Electronically Signed   By: Audie Pinto M.D.   On: 07/24/2019 09:01     I have reviewed the above imaging : Ct head negative for acute findings      ASSESSMENT AND PLAN  64 y.o. male with insulin-dependent diabetes presents with multiple seizures and altered mental status in the setting of severe hyperglycemia/DKA with glucose >1000. CT head neg for acute findings.    Seizures/ Status Epilepticus : resolved  Diabetic ketoacidosis Coma on presentation   Recommendations -Stat 71min  EEG obtained: no seizures -Loaded with Keppra 1 g and start 500 mg twice daily -Low threshold for intubation, discussed with CCM and EDP that I am concerned about patient  protecting  airway - MRI brain, especially if patient not back to baseline after correction of hyperglycemia and electrolyte abnormalities - Seizure precuations - Ativan for sz > 2 min    CRITICAL CARE Performed by: Lanice Schwab Mikaela Hilgeman   Total critical care time: 45  minutes  Critical care time was exclusive of separately billable procedures and treating other patients.  Critical care was necessary to treat or prevent imminent or life-threatening deterioration.  Critical care was time spent personally by me on the following activities: development of treatment plan with patient and/or surrogate as well as nursing, discussions with consultants, evaluation of patient's response to treatment, examination of patient, obtaining history from patient or surrogate, ordering and performing treatments and interventions, ordering and review of laboratory studies, ordering and review of radiographic studies, pulse oximetry and re-evaluation of patient's condition.   Vondell Babers [facility] Pager Number [phone]

## 2019-08-14 NOTE — Consult Note (Signed)
Reason for Consult: lactic acidosis Referring Physician: Jacky Kindle  Michael [name] is an 64 y.o. male.  HPI: 65 yo male said to be acting strange for a day and then found on the ground with altered mental status. He was brought to the ED by EMS and found to have nonketotic hyperglycemia. He was intubated and put on insulin drip but continues to have hyperglycemia > 600 and severe electrolyte abnormalities. At one point he was more responsive and there was concern for abdominal tenderness. An abdominal XR was obtained and showing dilated loops of intestine.  Past Medical History:  Diagnosis Date  . Anxiety   . Arthritis    "joints; shoulders; feet" (06/08/2016)  . Bipolar disorder ([place])   . Daily headache    "7:30 - 8:00 q night" (06/08/2016)  . Diabetic peripheral neuropathy ([place])   . GERD (gastroesophageal reflux disease)   . History of hepatitis B virus infection conferring immunity 03/22/2018  . Pancreatitis   . Schizophrenia ([place])   . Seizures ([place]) 01/2016 X 2  . Type II diabetes mellitus ([place])     Past Surgical History:  Procedure Laterality Date  . BALLOON DILATION  03/22/2011   Procedure: BALLOON DILATION;  Surgeon: Gatha Mayer, MD;  Location: [facility];  Service: Endoscopy;  Laterality: N/A;  . COLONOSCOPY N/A 05/22/2012   Procedure: COLONOSCOPY;  Surgeon: Gatha Mayer, MD;  Location: [facility];  Service: Endoscopy;  Laterality: N/A;  . COLONOSCOPY W/ BIOPSIES AND POLYPECTOMY  09/12/11  . ESOPHAGOGASTRODUODENOSCOPY  03/22/2011   Procedure: ESOPHAGOGASTRODUODENOSCOPY (EGD);  Surgeon: Gatha Mayer, MD;  Location: [facility];  Service: Endoscopy;  Laterality: N/A;  egd with balloon   . EUS  04/20/2011   Procedure: UPPER ENDOSCOPIC ULTRASOUND (EUS) LINEAR;  Surgeon: Milus Banister, MD;  Location: [facility];  Service: Endoscopy;  Laterality: N/A;  . KNEE ARTHROSCOPY Left     Family History  Problem Relation Age of Onset  . Hypertension Sister   . Diabetes  Sister   . Heart disease Sister   . Colon cancer Neg Hx   . Stomach cancer Neg Hx   . Anesthesia problems Neg Hx   . Hypotension Neg Hx   . Pseudochol deficiency Neg Hx   . Malignant hyperthermia Neg Hx     Social History:  reports that he has been smoking cigarettes. He has a 21.00 pack-year smoking history. He has never used smokeless tobacco. He reports current alcohol use of about 2.0 standard drinks of alcohol per week. He reports previous drug use. Drug: Marijuana.  Allergies:  Allergies  Allergen Reactions  . Ace Inhibitors Swelling and Other (See Comments)    Angioedema - unquestionable  . Losartan Swelling and Other (See Comments)    Pt presented with unquestionable angioedema on ACEIs (Angiotensin-converting enzyme (ACE) inhibitors) so aviod ARBs (Angiotensin receptor blockers), as well  . Tylenol [Acetaminophen] Other (See Comments)    "cannot have this" (takes Percocet at home)    Medications: I have reviewed the patient's current medications.  Results for orders placed or performed during the hospital encounter of 08/07/2019 (from the past 48 hour(s))  CBC with Differential/Platelet     Status: Abnormal   Collection Time: 08/06/2019  8:30 AM  Result Value Ref Range   WBC 22.3 (H) 4.0 - 10.5 K/uL   RBC 4.48 4.22 - 5.81 MIL/uL   Hemoglobin 14.2 13.0 - 17.0 g/dL   HCT 44.9 39 - 52 %   MCV 100.2 (  H) 80.0 - 100.0 fL   MCH 31.7 26.0 - 34.0 pg   MCHC 31.6 30.0 - 36.0 g/dL   RDW 14.6 11.5 - 15.5 %   Platelets 156 150 - 400 K/uL   nRBC 0.0 0.0 - 0.2 %   Neutrophils Relative % 91 %   Neutro Abs 20.3 (H) 1.7 - 7.7 K/uL   Lymphocytes Relative 5 %   Lymphs Abs 1.1 0.7 - 4.0 K/uL   Monocytes Relative 3 %   Monocytes Absolute 0.8 0 - 1 K/uL   Eosinophils Relative 0 %   Eosinophils Absolute 0.0 0 - 0 K/uL   Basophils Relative 0 %   Basophils Absolute 0.0 0 - 0 K/uL   Immature Granulocytes 1 %   Abs Immature Granulocytes 0.13 (H) 0.00 - 0.07 K/uL    Comment: Performed at  [facility] [address], [place], [place] [address]  Protime-INR     Status: Abnormal   Collection Time: 08/10/2019  8:30 AM  Result Value Ref Range   Prothrombin Time 15.7 (H) 11.4 - 15.2 seconds   INR 1.3 (H) 0.8 - 1.2    Comment: (NOTE) INR goal varies based on device and disease states. Performed at [facility], [place] [address], [place], [place] [address]   APTT     Status: None   Collection Time: 08/05/2019  8:30 AM  Result Value Ref Range   aPTT 32 24 - 36 seconds    Comment: Performed at [facility] [address] [place],  [address]  Comprehensive metabolic panel     Status: Abnormal   Collection Time: 08/13/2019  8:30 AM  Result Value Ref Range   Sodium 138 135 - 145 mmol/L   Potassium <2.0 (LL) 3.5 - 5.1 mmol/L    Comment: CRITICAL RESULT CALLED TO, READ BACK BY AND VERIFIED WITHMarzella Schlein RN [phone] BY A BENNETT    Chloride 102 98 - 111 mmol/L   CO2 13 (L) 22 - 32 mmol/L   Glucose, Bld 1,485 (HH) 70 - 99 mg/dL    Comment: RESULTS CONFIRMED BY MANUAL DILUTION Glucose reference range applies only to samples taken after fasting for at least 8 hours. CRITICAL RESULT CALLED TO, READ BACK BY AND VERIFIED WITHMarzella Schlein RN [phone] BY A BENNETT    BUN <5 (L) 8 - 23 mg/dL   Creatinine, Ser 1.28 (H) 0.61 - 1.24 mg/dL   Calcium 9.0 8.9 - 10.3 mg/dL   Total Protein 5.3 (L) 6.5 - 8.1 g/dL   Albumin 2.9 (L) 3.5 - 5.0 g/dL   AST 46 (H) 15 - 41 U/L   ALT 143 (H) 0 - 44 U/L   Alkaline Phosphatase 377 (H) 38 - 126 U/L   Total Bilirubin 0.8 0.3 - 1.2 mg/dL   GFR calc non Af Amer >60 >60 mL/min   GFR calc Af Amer >60 >60 mL/min   Anion gap 23 (H) 5 - 15    Comment: Performed at [facility], [place] [address] [place], [place] [address]  Troponin I (High Sensitivity)     Status: Abnormal   Collection Time: 08/20/2019  8:30 AM  Result Value Ref Range   Troponin I (High Sensitivity) 24 (H) <18 ng/L    Comment: (NOTE) Elevated high sensitivity  troponin I (hsTnI) values and significant  changes across serial measurements may suggest ACS but many other  chronic and acute conditions are known to elevate  hsTnI results.  Refer to the Links section for chest pain algorithms and additional  guidance. Performed at [facility], [place] [address], [place], [place] [address]   Lactic acid, plasma     Status: Abnormal   Collection Time: 08/10/2019  8:30 AM  Result Value Ref Range   Lactic Acid, Venous >11.0 (HH) 0.5 - 1.9 mmol/L    Comment: CRITICAL RESULT CALLED TO, READ BACK BY AND VERIFIED WITH: M.FOUNTAIN,RN 9449 08/15/2019 CLARK,S Performed at [facility], [place] [address], [place], [place] [address]   Ethanol     Status: None   Collection Time: 07/25/2019  8:30 AM  Result Value Ref Range   Alcohol, Ethyl (B) <10 <10 mg/dL    Comment: (NOTE) Lowest detectable limit for serum alcohol is 10 mg/dL.  For medical purposes only. Performed at [facility], [place] [address], [place], [place] [address]   Magnesium     Status: None   Collection Time: 07/27/2019  8:30 AM  Result Value Ref Range   Magnesium 1.8 1.7 - 2.4 mg/dL    Comment: Performed at [facility] [address] [place], [place] [address]  CBG monitoring, ED     Status: Abnormal   Collection Time: 07/30/2019  8:33 AM  Result Value Ref Range   Glucose-Capillary >600 (HH) 70 - 99 mg/dL    Comment: Glucose reference range applies only to samples taken after fasting for at least 8 hours.   Comment 1 Notify RN    Comment 2 Document in Chart   I-Stat arterial blood gas, ED     Status: Abnormal   Collection Time: 08/11/2019  8:40 AM  Result Value Ref Range   pH, Arterial 7.114 (LL) 7.35 - 7.45   pCO2 arterial 47.3 32 - 48 mmHg   pO2, Arterial 419 (H) 83 - 108 mmHg   Bicarbonate 15.2 (L) 20.0 - 28.0 mmol/L   TCO2 17 (L) 22 - 32 mmol/L   O2 Saturation 100.0 %   Acid-base deficit 14.0 (H) 0.0 - 2.0 mmol/L   Sodium 140 135 - 145 mmol/L   Potassium <2.0 (LL) 3.5  - 5.1 mmol/L   Calcium, Ion 1.29 1.15 - 1.40 mmol/L   HCT 44.0 39 - 52 %   Hemoglobin 15.0 13.0 - 17.0 g/dL   Sample type ARTERIAL   Beta-hydroxybutyric acid     Status: None   Collection Time: 08/01/2019  8:47 AM  Result Value Ref Range   Beta-Hydroxybutyric Acid 0.11 0.05 - 0.27 mmol/L    Comment: Performed at [facility], [address] [place], [place] [address]  I-Stat Venous Blood Gas, ED     Status: Abnormal   Collection Time: 08/21/2019  8:52 AM  Result Value Ref Range   pH, Ven 7.020 (LL) 7.25 - 7.43   pCO2, Ven 63.7 (H) 44 - 60 mmHg   pO2, Ven 69.0 (H) 32 - 45 mmHg   Bicarbonate 16.5 (L) 20.0 - 28.0 mmol/L   TCO2 18 (L) 22 - 32 mmol/L   O2 Saturation 82.0 %   Acid-base deficit 15.0 (H) 0.0 - 2.0 mmol/L   Sodium 142 135 - 145 mmol/L   Potassium <2.0 (LL) 3.5 - 5.1 mmol/L   Calcium, Ion 1.27 1.15 - 1.40 mmol/L   HCT 46.0 39 - 52 %   Hemoglobin 15.6 13.0 - 17.0 g/dL   Sample type VENOUS   CBG monitoring, ED     Status: Abnormal  Collection Time: 07/30/2019  9:46 AM  Result Value Ref Range   Glucose-Capillary >600 (HH) 70 - 99 mg/dL    Comment: Glucose reference range applies only to samples taken after fasting for at least 8 hours.  Rapid urine drug screen (hospital performed)     Status: None   Collection Time: 08/08/2019 10:16 AM  Result Value Ref Range   Opiates NONE DETECTED NONE DETECTED   Cocaine NONE DETECTED NONE DETECTED   Benzodiazepines NONE DETECTED NONE DETECTED   Amphetamines NONE DETECTED NONE DETECTED   Tetrahydrocannabinol NONE DETECTED NONE DETECTED   Barbiturates NONE DETECTED NONE DETECTED    Comment: (NOTE) DRUG SCREEN FOR MEDICAL PURPOSES ONLY.  IF CONFIRMATION IS NEEDED FOR ANY PURPOSE, NOTIFY LAB WITHIN 5 DAYS.  LOWEST DETECTABLE LIMITS FOR URINE DRUG SCREEN Drug Class                     Cutoff (ng/mL) Amphetamine and metabolites    1000 Barbiturate and metabolites    200 Benzodiazepine                 401 Tricyclics and metabolites      300 Opiates and metabolites        300 Cocaine and metabolites        300 THC                            50 Performed at [facility], [place] [address] [place], [place] [address]   Urinalysis, Routine w reflex microscopic     Status: Abnormal   Collection Time: 07/25/2019 10:16 AM  Result Value Ref Range   Color, Urine STRAW (A) YELLOW   APPearance CLEAR CLEAR   Specific Gravity, Urine 1.025 1.005 - 1.030   pH 6.0 5.0 - 8.0   Glucose, UA >=500 (A) NEGATIVE mg/dL   Hgb urine dipstick MODERATE (A) NEGATIVE   Bilirubin Urine NEGATIVE NEGATIVE   Ketones, ur NEGATIVE NEGATIVE mg/dL   Protein, ur NEGATIVE NEGATIVE mg/dL   Nitrite NEGATIVE NEGATIVE   Leukocytes,Ua NEGATIVE NEGATIVE   RBC / HPF 0-5 0 - 5 RBC/hpf   WBC, UA 0-5 0 - 5 WBC/hpf   Bacteria, UA RARE (A) NONE SEEN    Comment: Performed at [facility] [address] [place], [place] [address]  CBG monitoring, ED     Status: Abnormal   Collection Time: 07/26/2019 10:22 AM  Result Value Ref Range   Glucose-Capillary >600 (HH) 70 - 99 mg/dL    Comment: Glucose reference range applies only to samples taken after fasting for at least 8 hours.  SARS Coronavirus 2 by RT PCR (hospital order, performed in [facility] hospital lab) Nasopharyngeal Nasopharyngeal Swab     Status: None   Collection Time: 08/20/2019 10:45 AM   Specimen: Nasopharyngeal Swab  Result Value Ref Range   SARS Coronavirus 2 NEGATIVE NEGATIVE    Comment: (NOTE) SARS-CoV-2 target nucleic acids are NOT DETECTED.  The SARS-CoV-2 RNA is generally detectable in upper and lower respiratory specimens during the acute phase of infection. The lowest concentration of SARS-CoV-2 viral copies this assay can detect is 250 copies / mL. A negative result does not preclude SARS-CoV-2 infection and should not be used as the sole basis for treatment or other patient management decisions.  A negative result may occur with improper specimen collection / handling,  submission of specimen other than nasopharyngeal swab, presence  of viral mutation(s) within the areas targeted by this assay, and inadequate number of viral copies (<250 copies / mL). A negative result must be combined with clinical observations, patient history, and epidemiological information.  Fact Sheet for Patients:   StrictlyIdeas.no  Fact Sheet for Healthcare Providers: BankingDealers.co.za  This test is not yet approved or  cleared by the [place] FDA and has been authorized for detection and/or diagnosis of SARS-CoV-2 by FDA under an Emergency Use Authorization (EUA).  This EUA will remain in effect (meaning this test can be used) for the duration of the COVID-19 declaration under Section 564(b)(1) of the Act, 21 U.S.C. section 360bbb-3(b)(1), unless the authorization is terminated or revoked sooner.  Performed at [facility], [place] [address], [place], [place] [address]   I-Stat arterial blood gas, ED     Status: Abnormal   Collection Time: 07/31/2019 11:27 AM  Result Value Ref Range   pH, Arterial 7.223 (L) 7.35 - 7.45   pCO2 arterial 33.6 32 - 48 mmHg   pO2, Arterial 70 (L) 83 - 108 mmHg   Bicarbonate 14.8 (L) 20.0 - 28.0 mmol/L   TCO2 16 (L) 22 - 32 mmol/L   O2 Saturation 95.0 %   Acid-base deficit 13.0 (H) 0.0 - 2.0 mmol/L   Sodium 147 (H) 135 - 145 mmol/L   Potassium <2.0 (LL) 3.5 - 5.1 mmol/L   Calcium, Ion 1.37 1.15 - 1.40 mmol/L   HCT 43.0 39 - 52 %   Hemoglobin 14.6 13.0 - 17.0 g/dL   Patient temperature 90.1 F    Sample type ARTERIAL   CBG monitoring, ED     Status: Abnormal   Collection Time: 08/07/2019 11:35 AM  Result Value Ref Range   Glucose-Capillary >600 (HH) 70 - 99 mg/dL    Comment: Glucose reference range applies only to samples taken after fasting for at least 8 hours.  Lactic acid, plasma     Status: Abnormal   Collection Time: 07/29/2019 11:48 AM  Result Value Ref Range   Lactic Acid,  Venous 10.7 (HH) 0.5 - 1.9 mmol/L    Comment: CRITICAL VALUE NOTED.  VALUE IS CONSISTENT WITH PREVIOUSLY REPORTED AND CALLED VALUE. Performed at [facility], [place] [address], [place], [place] [address]   Troponin I (High Sensitivity)     Status: Abnormal   Collection Time: 08/17/2019 11:48 AM  Result Value Ref Range   Troponin I (High Sensitivity) 39 (H) <18 ng/L    Comment: (NOTE) Elevated high sensitivity troponin I (hsTnI) values and significant  changes across serial measurements may suggest ACS but many other  chronic and acute conditions are known to elevate hsTnI results.  Refer to the Links section for chest pain algorithms and additional  guidance. Performed at [facility], [place] [address], [place], [place] [address]   Comprehensive metabolic panel     Status: Abnormal   Collection Time: 08/17/2019 11:48 AM  Result Value Ref Range   Sodium 132 (L) 135 - 145 mmol/L   Potassium >7.5 (HH) 3.5 - 5.1 mmol/L    Comment: NO VISIBLE HEMOLYSIS CRITICAL RESULT CALLED TO, READ BACK BY AND VERIFIED WITH: Domingo Mend RN [phone] BY OWENSP    Chloride 110 98 - 111 mmol/L   CO2 16 (L) 22 - 32 mmol/L   Glucose, Bld 1,073 (HH) 70 - 99 mg/dL    Comment: Glucose reference range applies only to samples taken after fasting for at least 8 hours.  CRITICAL RESULT CALLED TO, READ BACK BY AND VERIFIED WITH: Domingo Mend RN [phone] BY OWENSP    BUN <5 (L) 8 - 23 mg/dL   Creatinine, Ser 1.15 0.61 - 1.24 mg/dL   Calcium 7.8 (L) 8.9 - 10.3 mg/dL   Total Protein 4.7 (L) 6.5 - 8.1 g/dL   Albumin 2.5 (L) 3.5 - 5.0 g/dL   AST 46 (H) 15 - 41 U/L   ALT 128 (H) 0 - 44 U/L   Alkaline Phosphatase 308 (H) 38 - 126 U/L   Total Bilirubin 0.6 0.3 - 1.2 mg/dL   GFR calc non Af Amer >60 >60 mL/min   GFR calc Af Amer >60 >60 mL/min   Anion gap 6 5 - 15    Comment: Performed at [facility], [place] [address], [place], [place] [address]  Magnesium     Status: Abnormal   Collection  Time: 08/06/2019 11:48 AM  Result Value Ref Range   Magnesium 1.4 (L) 1.7 - 2.4 mg/dL    Comment: Performed at [facility] [address], [place], [place] [address]  Phosphorus     Status: Abnormal   Collection Time: 07/25/2019 11:48 AM  Result Value Ref Range   Phosphorus 1.2 (L) 2.5 - 4.6 mg/dL    Comment: Performed at [facility] [address], [place], [place] [address]  Cortisol     Status: None   Collection Time: 08/08/2019 11:48 AM  Result Value Ref Range   Cortisol, Plasma 63.5 ug/dL    Comment: RESULTS CONFIRMED BY MANUAL DILUTION (NOTE) AM    6.7 - 22.6 ug/dL PM   <10.0       ug/dL Performed at [facility] [address], [place], [place] [address]   CBC     Status: Abnormal   Collection Time: 07/24/2019 11:48 AM  Result Value Ref Range   WBC 15.3 (H) 4.0 - 10.5 K/uL   RBC 4.21 (L) 4.22 - 5.81 MIL/uL   Hemoglobin 13.3 13.0 - 17.0 g/dL   HCT 40.5 39 - 52 %   MCV 96.2 80.0 - 100.0 fL   MCH 31.6 26.0 - 34.0 pg   MCHC 32.8 30.0 - 36.0 g/dL   RDW 14.1 11.5 - 15.5 %   Platelets 120 (L) 150 - 400 K/uL   nRBC 0.0 0.0 - 0.2 %    Comment: Performed at [facility], [place] [address] [place], [place] [address]  Protime-INR     Status: Abnormal   Collection Time: 08/06/2019 11:48 AM  Result Value Ref Range   Prothrombin Time 16.0 (H) 11.4 - 15.2 seconds   INR 1.3 (H) 0.8 - 1.2    Comment: (NOTE) INR goal varies based on device and disease states. Performed at [facility], [place] [address] [place], [place] [address]   APTT     Status: None   Collection Time: 07/31/2019 11:48 AM  Result Value Ref Range   aPTT 32 24 - 36 seconds    Comment: Performed at [facility] [address], [place], [place] [address]  Lipase, blood     Status: None   Collection Time: 08/16/2019 11:48 AM  Result Value Ref Range   Lipase 19 11 - 51 U/L    Comment: Performed at [facility] [address], [place],  [address]  Amylase     Status:  Abnormal   Collection Time: 08/03/2019 11:48 AM  Result Value Ref Range   Amylase 383 (H) 28 - 100 U/L    Comment: Performed at [facility] [address], [place], [place] [address]  TSH     Status: None   Collection Time: 07/24/2019 11:48 AM  Result Value Ref Range   TSH 0.487 0.350 - 4.500 uIU/mL    Comment: Performed by a 3rd Generation assay with a functional sensitivity of <=0.01 uIU/mL. Performed at [facility], [place] [address] [place], [place] [address]   Procalcitonin     Status: None   Collection Time: 08/19/2019 11:48 AM  Result Value Ref Range   Procalcitonin 0.99 ng/mL    Comment:        Interpretation: PCT > 0.5 ng/mL and <= 2 ng/mL: Systemic infection (sepsis) is possible, but other conditions are known to elevate PCT as well. (NOTE)       Sepsis PCT Algorithm           Lower Respiratory Tract                                      Infection PCT Algorithm    ----------------------------     ----------------------------         PCT < 0.25 ng/mL                PCT < 0.10 ng/mL          Strongly encourage             Strongly discourage   discontinuation of antibiotics    initiation of antibiotics    ----------------------------     -----------------------------       PCT 0.25 - 0.50 ng/mL            PCT 0.10 - 0.25 ng/mL               OR       >80% decrease in PCT            Discourage initiation of                                            antibiotics      Encourage discontinuation           of antibiotics    ----------------------------     -----------------------------         PCT >= 0.50 ng/mL              PCT 0.26 - 0.50 ng/mL                AND       <80% decrease in PCT             Encourage initiation of                                             antibiotics       Encourage continuation           of antibiotics    ----------------------------     -----------------------------        PCT >= 0.50 ng/mL  PCT > 0.50 ng/mL                AND         increase in PCT                  Strongly encourage                                      initiation of antibiotics    Strongly encourage escalation           of antibiotics                                     -----------------------------                                           PCT <= 0.25 ng/mL                                                 OR                                        > 80% decrease in PCT                                      Discontinue / Do not initiate                                             antibiotics  Performed at [facility], [address] [place], [place] [address]   Culture, blood (routine x 2)     Status: None (Preliminary result)   Collection Time: 08/15/2019 11:55 AM   Specimen: BLOOD RIGHT HAND  Result Value Ref Range   Specimen Description BLOOD RIGHT HAND    Special Requests      BOTTLES DRAWN AEROBIC AND ANAEROBIC Blood Culture adequate volume   Culture      NO GROWTH < 12 HOURS Performed at [facility], [place] [address] [place], [place] [address]    Report Status PENDING   Urinalysis, Complete w Microscopic     Status: Abnormal   Collection Time: 08/23/2019  2:00 PM  Result Value Ref Range   Color, Urine STRAW (A) YELLOW   APPearance HAZY (A) CLEAR   Specific Gravity, Urine 1.024 1.005 - 1.030   pH 6.0 5.0 - 8.0   Glucose, UA >=500 (A) NEGATIVE mg/dL   Hgb urine dipstick SMALL (A) NEGATIVE   Bilirubin Urine NEGATIVE NEGATIVE   Ketones, ur NEGATIVE NEGATIVE mg/dL   Protein, ur >=300 (A) NEGATIVE mg/dL   Nitrite NEGATIVE NEGATIVE   Leukocytes,Ua NEGATIVE NEGATIVE   RBC / HPF 0-5 0 - 5 RBC/hpf   WBC, UA 0-5 0 - 5 WBC/hpf   Bacteria, UA  RARE (A) NONE SEEN   Squamous Epithelial / LPF 0-5 0 - 5   Mucus PRESENT    Budding Yeast PRESENT     Comment: Performed at [facility], [place] [address] [place], [place] [address]  MRSA PCR Screening     Status: None   Collection Time: 07/24/2019  2:12 PM    Specimen: Nasal Mucosa; Nasopharyngeal  Result Value Ref Range   MRSA by PCR NEGATIVE NEGATIVE    Comment:        The GeneXpert MRSA Assay (FDA approved for NASAL specimens only), is one component of a comprehensive MRSA colonization surveillance program. It is not intended to diagnose MRSA infection nor to guide or monitor treatment for MRSA infections. Performed at [facility], [place] [address], [place], [place] [address]   Glucose, capillary     Status: Abnormal   Collection Time: 08/23/2019  2:25 PM  Result Value Ref Range   Glucose-Capillary >600 (HH) 70 - 99 mg/dL    Comment: Glucose reference range applies only to samples taken after fasting for at least 8 hours.  Glucose, capillary     Status: Abnormal   Collection Time: 07/27/2019  2:27 PM  Result Value Ref Range   Glucose-Capillary >600 (HH) 70 - 99 mg/dL    Comment: Glucose reference range applies only to samples taken after fasting for at least 8 hours.  Culture, blood (routine x 2)     Status: None (Preliminary result)   Collection Time: 07/24/2019  2:50 PM   Specimen: BLOOD  Result Value Ref Range   Specimen Description BLOOD    Special Requests      BOTTLES DRAWN AEROBIC AND ANAEROBIC Blood Culture results may not be optimal due to an inadequate volume of blood received in culture bottles  CVC   Culture      NO GROWTH <12 HOURS Performed at [facility] [address], [place], [place] [address]    Report Status PENDING   Basic metabolic panel     Status: Abnormal   Collection Time: 08/05/2019  2:51 PM  Result Value Ref Range   Sodium 147 (H) 135 - 145 mmol/L    Comment: DELTA CHECK NOTED   Potassium <2.0 (LL) 3.5 - 5.1 mmol/L    Comment: CRITICAL RESULT CALLED TO, READ BACK BY AND VERIFIED WITH: T MENDOZA RN 1531 [phone] K FORSYTH DELTA CHECK NOTED    Chloride 118 (H) 98 - 111 mmol/L   CO2 18 (L) 22 - 32 mmol/L   Glucose, Bld 866 (HH) 70 - 99 mg/dL    Comment: Glucose reference range applies only  to samples taken after fasting for at least 8 hours. CRITICAL RESULT CALLED TO, READ BACK BY AND VERIFIED WITH: T MENDOZA RN 1531 [id number] K FORSYTH    BUN <5 (L) 8 - 23 mg/dL   Creatinine, Ser 0.99 0.61 - 1.24 mg/dL   Calcium 7.8 (L) 8.9 - 10.3 mg/dL   GFR calc non Af Amer >60 >60 mL/min   GFR calc Af Amer >60 >60 mL/min   Anion gap 11 5 - 15    Comment: Performed at [facility] [address] [place], [place] [address]  Lactic acid, plasma     Status: Abnormal   Collection Time: 08/22/2019  2:51 PM  Result Value Ref Range   Lactic Acid, Venous 7.7 (HH) 0.5 - 1.9 mmol/L    Comment: CRITICAL VALUE NOTED.  VALUE IS CONSISTENT WITH  PREVIOUSLY REPORTED AND CALLED VALUE. Performed at [facility], [place] [address] [place], [place] [address]   I-STAT 7, (LYTES, BLD GAS, ICA, H+H)     Status: Abnormal   Collection Time: 08/23/2019  4:14 PM  Result Value Ref Range   pH, Arterial 7.167 (LL) 7.35 - 7.45   pCO2 arterial 42.6 32 - 48 mmHg   pO2, Arterial 116 (H) 83 - 108 mmHg   Bicarbonate 15.6 (L) 20.0 - 28.0 mmol/L   TCO2 17 (L) 22 - 32 mmol/L   O2 Saturation 97.0 %   Acid-base deficit 13.0 (H) 0.0 - 2.0 mmol/L   Sodium 153 (H) 135 - 145 mmol/L   Potassium <2.0 (LL) 3.5 - 5.1 mmol/L   Calcium, Ion 1.34 1.15 - 1.40 mmol/L   HCT 43.0 39 - 52 %   Hemoglobin 14.6 13.0 - 17.0 g/dL   Patient temperature 97.5 F    Collection site Radial    Drawn by RT    Sample type ARTERIAL   Glucose, capillary     Status: Abnormal   Collection Time: 07/31/2019  4:38 PM  Result Value Ref Range   Glucose-Capillary >600 (HH) 70 - 99 mg/dL    Comment: Glucose reference range applies only to samples taken after fasting for at least 8 hours.  Strep pneumoniae urinary antigen     Status: None   Collection Time: 08/20/2019  4:45 PM  Result Value Ref Range   Strep Pneumo Urinary Antigen NEGATIVE NEGATIVE    Comment:        Infection due to S. pneumoniae cannot be absolutely ruled out since the antigen  present may be below the detection limit of the test. Performed at [facility], [address] [place], [place] [address]   Glucose, capillary     Status: Abnormal   Collection Time: 08/16/2019  5:03 PM  Result Value Ref Range   Glucose-Capillary >600 (HH) 70 - 99 mg/dL    Comment: Glucose reference range applies only to samples taken after fasting for at least 8 hours.  Glucose, capillary     Status: Abnormal   Collection Time: 07/25/2019  5:50 PM  Result Value Ref Range   Glucose-Capillary >600 (HH) 70 - 99 mg/dL    Comment: Glucose reference range applies only to samples taken after fasting for at least 8 hours.  Glucose, capillary     Status: Abnormal   Collection Time: 08/01/2019  6:33 PM  Result Value Ref Range   Glucose-Capillary >600 (HH) 70 - 99 mg/dL    Comment: Glucose reference range applies only to samples taken after fasting for at least 8 hours.    EEG  Result Date: 07/31/2019 Lora Havens, MD     07/29/2019 11:53 AM Patient Name: Michael [name] MRN: [id number] Epilepsy Attending: Lora Havens Referring Physician/Provider: Dr Lajean Saver Date: 08/18/2019 Duration: 26.27 mins Patient history: 64 y.o. male with insulin-dependent diabetes presents with multiple seizures and altered mental status in the setting of severe hyperglycemia/DKA with glucose >1000. EEG to evaluate for seizure. Level of alertness: comatose AEDs during EEG study: LEV Technical aspects: This EEG study was done with scalp electrodes positioned according to the 10-20 International system of electrode placement. Electrical activity was acquired at a sampling rate of 500Hz  and reviewed with a high frequency filter of 70Hz  and a low frequency filter of 1Hz . EEG data were recorded continuously and digitally stored. Description: EEG showed continuous generalized low amplitude 3 to  6 Hz theta-delta slowing.  Hyperventilation and photic stimulation were not performed.   Of note, eeg was technically difficult  due to significant movement artifact. ABNORMALITY -Continuous slow, generalized IMPRESSION: This technically difficult study is suggestive of severe diffuse encephalopathy, nonspecific etiology but could be related to sedation, toxic-metabolic etiology. No seizures or epileptiform discharges were seen throughout the recording. D Aroor was notified. Lora Havens   CT HEAD WO CONTRAST  Result Date: 07/27/2019 CLINICAL DATA:  Delirium. Additional provided: Respiratory distress, unresponsiveness EXAM: CT HEAD WITHOUT CONTRAST TECHNIQUE: Contiguous axial images were obtained from the base of the skull through the vertex without intravenous contrast. COMPARISON:  Prior head CT examinations 04/09/2019 and earlier FINDINGS: Brain: The examination is moderate motion degraded, limiting evaluation. Stable, moderate generalized parenchymal atrophy. Stable, moderate patchy hypoattenuation within the cerebral white matter which is nonspecific, but consistent with chronic small vessel ischemic disease. Redemonstrated known chronic lacunar infarcts within the thalami bilaterally. There is no evidence of acute intracranial hemorrhage. No demarcated cortical infarct is identified. No extra-axial fluid collection. No evidence of intracranial mass. No midline shift. Vascular: No hyperdense vessel.  Atherosclerotic calcifications. Skull: Normal. Negative for fracture or focal lesion. Sinuses/Orbits: No acute orbital abnormality is identified. Frothy secretions within the maxillary sinuses bilaterally. No significant mastoid effusion. IMPRESSION: Moderately motion degraded examination, limiting evaluation. No evidence of acute intracranial abnormality. Stable moderate generalized parenchymal atrophy and chronic small vessel ischemic disease. Redemonstrated chronic thalamic lacunar infarcts. Bilateral maxillary sinusitis. Electronically Signed   By: Kellie Simmering DO   On: 08/20/2019 13:08   CT CHEST WO CONTRAST  Result Date:  07/25/2019 CLINICAL DATA:  Respiratory distress.  Found unresponsive. EXAM: CT CHEST WITHOUT CONTRAST TECHNIQUE: Multidetector CT imaging of the chest was performed following the standard protocol without IV contrast. COMPARISON:  Chest x-ray from same day. FINDINGS: Cardiovascular: No significant vascular findings. Normal heart size. No pericardial effusion. No thoracic aortic aneurysm. Coronary, aortic arch, and branch vessel atherosclerotic vascular disease. Mediastinum/Nodes: No enlarged mediastinal or axillary lymph nodes. Patulous esophagus. Thyroid gland and trachea demonstrate no significant findings. Lungs/Pleura: Dense consolidation and collapse of the right lower lobe with occlusion of the right lower lobe bronchus. Dense consolidation within the left lower lobe. Patchy consolidation in the right upper and middle lobes. Peribronchovascular ground-glass nodularity in the right upper, right middle, left upper, and left lower lobes. No pleural effusion or pneumothorax. Upper Abdomen: No acute abnormality. Nodular liver contour. Small ascites in the upper abdomen. Musculoskeletal: No chest wall mass or suspicious bone lesions identified. IMPRESSION: 1. Multifocal pneumonia involving all lobes of both lungs. Occlusion of the right lower lobe bronchus. Some degree of underlying aspiration is suspected, especially in the setting of a patulous esophagus. 2. Cirrhosis.  Small ascites in the upper abdomen. 3. Aortic Atherosclerosis (ICD10-I70.0). Electronically Signed   By: Titus Dubin M.D.   On: 08/04/2019 13:23   DG CHEST PORT 1 VIEW  Result Date: 08/08/2019 CLINICAL DATA:  Endotracheal tube repositioning. Respiratory failure. EXAM: PORTABLE CHEST 1 VIEW COMPARISON:  3:08 p.m. FINDINGS: Endotracheal tube is been withdrawn, now 4.2 cm above the carina. Left internal jugular central venous catheter with its tip within the superior right atrium and nasogastric tube looped within the gastric fundus are  unchanged. Multifocal pulmonary infiltrates are again identified, more severe within the right upper lobe, likely infectious in the acute setting. Small bilateral pleural effusions are suspected. No pneumothorax. Cardiac size within normal limits. Pulmonary vascularity is normal. No acute bone abnormality.  IMPRESSION: Endotracheal tube in appropriate position. Stable multifocal pulmonary infiltrates, likely infectious. Small associated bilateral pleural effusions. Electronically Signed   By: Fidela Salisbury MD   On: 08/13/2019 16:16   Portable Chest x-ray  Result Date: 08/15/2019 CLINICAL DATA:  Intubation EXAM: PORTABLE CHEST 1 VIEW COMPARISON:  Portable exam 1508 hours compared to 0848 hours FINDINGS: Tip of endotracheal tube is into RIGHT mainstem bronchus; recommend withdrawal 3-4 cm. Nasogastric tube coiled in proximal stomach. LEFT jugular line with tip projecting over cavoatrial junction. Normal heart size and mediastinal contours. Bibasilar pleural effusions. Significant new infiltrate in RIGHT upper lobe, less RIGHT base, question pneumonia or aspiration. No pneumothorax or acute osseous findings. IMPRESSION: Tip of endotracheal tube within proximal RIGHT mainstem bronchus recommend withdrawal 3-4 cm; findings called to to MICU prior dictation of this report. Bibasilar effusions and atelectasis with significant infiltrate RIGHT upper lobe, less at RIGHT base, question pneumonia or aspiration. Electronically Signed   By: Lavonia Dana M.D.   On: 07/25/2019 15:40   DG Chest Port 1 View  Result Date: 08/20/2019 CLINICAL DATA:  SOB,weakness EXAM: PORTABLE CHEST 1 VIEW COMPARISON:  Chest radiograph 06/09/2019 FINDINGS: Stable cardiomediastinal contours. Low lung volumes. Possible subtle opacity in the right lower lung. The left lung is clear. No pneumothorax or significant pleural effusion. Large area of lucency in the left upper quadrant possibly representing gastric distention. No acute finding in the  visualized skeleton. IMPRESSION: 1. Possible subtle opacity in the right lower lung. Evaluation somewhat limited by low lung volumes. 2. Large area of lucency in the left upper quadrant possibly representing gastric distention. Recommend dedicated abdominal radiographs. Electronically Signed   By: Audie Pinto M.D.   On: 08/17/2019 09:01   DG Abd Portable 1V  Result Date: 08/22/2019 CLINICAL DATA:  Orogastric tube placement EXAM: PORTABLE ABDOMEN - 1 VIEW COMPARISON:  None. FINDINGS: Orogastric tube appears looped within the gastric fundus. Multiple loops of prominent gas-filled small bowel are seen within the mid abdomen. No significant gas is seen within the the colon. The findings are suggestive of a partial or developing distal small bowel obstruction. Pelvis is excluded from view. IMPRESSION: Orogastric tube within the gastric fundus. Findings suggesting a distal partial or developing complete small bowel obstruction. Electronically Signed   By: Fidela Salisbury MD   On: 07/24/2019 16:17    ROS  PE Blood pressure 99/85, pulse (!) 120, temperature (!) 97.5 F (36.4 C), temperature source Oral, resp. rate (!) 28, height 5\' 4"  (1.626 m), weight 49.5 kg, SpO2 99 %. Constitutional: intubated, nonresponsive; no deformities Eyes: Moist conjunctiva; eyes resting in closed position; anicteric; Neck: Trachea midline; no thyromegaly Lungs: intubated and ventilated; no tactile fremitus CV: tachycardic; no palpable thrills; no pitting edema GI: Abd soft, no rigidity; no palpable hepatosplenomegaly MSK: no deformities, unable to assess gait; no clubbing/cyanosis Psychiatric: nonresponsive Lymphatic: No palpable cervical or axillary lymphadenopathy   Assessment/Plan: 64 yo male with long history of diabetes mellitus presenting with HHNK vs DKA with CT chest concerning for multifocal pneumonia, cirrhotic changes to the liver with ascites. Neurologic consult was concerning for severe encephalopathy on  EEG. Reviewing the XR images and the CT chest images is not overtly concerning for acute abdominal pathology -I discussed with Dr. Tacy Learn after examining the patient and reviewing his chart that metabolic correction should take high priority over imaging his abdomen at this time. -I did recommend NG tube to LIS  [employer] [place] 07/28/2019, 7:08 PM

## 2019-08-14 NOTE — Procedures (Signed)
Patient Name: Michael [name]  MRN: [id number]  Epilepsy Attending: Lora Havens  Referring Physician/Provider: Dr Lajean Saver Date: 07/28/2019 Duration: 26.27 mins  Patient history: 64 y.o. male with insulin-dependent diabetes presents with multiple seizures and altered mental status in the setting of severe hyperglycemia/DKA with glucose >1000. EEG to evaluate for seizure.  Level of alertness: comatose  AEDs during EEG study: LEV  Technical aspects: This EEG study was done with scalp electrodes positioned according to the 10-20 International system of electrode placement. Electrical activity was acquired at a sampling rate of 500Hz  and reviewed with a high frequency filter of 70Hz  and a low frequency filter of 1Hz . EEG data were recorded continuously and digitally stored.   Description: EEG showed continuous generalized low amplitude 3 to 6 Hz theta-delta slowing.  Hyperventilation and photic stimulation were not performed.     Of note, eeg was technically difficult due to significant movement artifact.  ABNORMALITY -Continuous slow, generalized  IMPRESSION: This technically difficult study is suggestive of severe diffuse encephalopathy, nonspecific etiology but could be related to sedation, toxic-metabolic etiology. No seizures or epileptiform discharges were seen throughout the recording.  D Aroor was notified.  Ronna Herskowitz Barbra Sarks

## 2019-08-14 NOTE — Progress Notes (Signed)
Patient had abdominal x-ray done, which is suggestive of partial versus progressive small bowel obstruction, his lactate is still elevated to 7.7. General surgery consult is requested, they recommend doing CT abdomen and pelvis with and without contrast, which was ordered.  Jacky Kindle MD Critical care physician [facility]  Pager: [phone] Mobile: [phone]

## 2019-08-14 NOTE — ED Notes (Signed)
Bair hugger applied to patient.

## 2019-08-14 NOTE — H&P (Signed)
NAME:  Michael [name], MRN:  [id number], DOB:  09/10/1955, LOS: 0 ADMISSION DATE:  08/03/2019, CONSULTATION DATE: 08/17/2019 REFERRING MD: EDP, CHIEF COMPLAINT: Altered mental status in the setting of hyperglycemia suspected pneumonia complicated by poor nutritional status and failure to thrive.  Brief History   64 year old with history of noncompliance insulin-dependent diabetes mellitus who presents 08/13/2019 with seizure activity altered mental status glucose failure 14 a potassium of 2.0.  Pulmonary critical care asked to admit  History of present illness   64 year old male with extensive past medical history which is well documented below notable for diabetes mellitus insulin-dependent for which he is poorly compliant with medications and has frequent admissions for hyperglycemia and hypoglycemia.  EMS was called to his home today 08/20/2019 for altered mental status EMS arrived found him having a full grand mal seizure he was treated with 10 mg of Versed the glucometer said his glucose was 400 on arrival to [facility] his glucose via lab was greater than 1400.  He is extremely acidotic hypokalemic and having 2 small respirations in obvious distress.  Neurology was consulted for his seizure activity and he was placed on Keppra. Low threshold for intubation due to the severity of his acidosis and poor physical health.  At the time of this dictation his COVID-19 is pending, he is on insulin drip.  Repeat labs have been ordered to determine the effect of fluid resuscitation insulin administration and potassium repletion.  Past Medical History   Past Medical History:  Diagnosis Date  . Anxiety   . Arthritis    "joints; shoulders; feet" (06/08/2016)  . Bipolar disorder ([place])   . Daily headache    "7:30 - 8:00 q night" (06/08/2016)  . Diabetic peripheral neuropathy ([place])   . GERD (gastroesophageal reflux disease)   . History of hepatitis B virus infection conferring immunity 03/22/2018  .  Pancreatitis   . Schizophrenia ([place])   . Seizures ([place]) 01/2016 X 2  . Type II diabetes mellitus ([place])      Significant Hospital Events   Presented to the emergency department 08/13/2019 altered mental status glucose greater than 1400 and noted to have grand mal seizure prior to admission  Consults:  07/25/2019 neurology Procedures:    Significant Diagnostic Tests:    Micro Data:  07/28/2019 blood culture x2>> 08/04/2019 urine culture>> 08/05/2019 Legionella>> 08/11/2019 COVID-19 testing>> pending at the time of this dictation  Antimicrobials:  07/31/2019 vancomycin>> 08/10/2019 cefepime>>  Interim history/subjective:  64 year old male who is in obvious distress glucose greater than 1450 respirations tachycardic with heart rate greater than 160.  Objective   Blood pressure 135/78, pulse (!) 120, temperature (!) 90.1 F (32.3 C), temperature source Rectal, resp. rate (!) 22, height 5' 4"  (1.626 m), weight 56.7 kg, SpO2 90 %.       No intake or output data in the 24 hours ending 07/26/2019 1039 Filed Weights   08/17/2019 1000  Weight: 56.7 kg    Examination: General: Frail cachectic male in obvious respiratory distress HENT: Noted to have increased secretions in his throat no JVD or lymphadenopathy is appreciated Lungs: Coarse rhonchi bilaterally Cardiovascular: Heart sounds are tachycardic sinus tach 160s Abdomen: Abdomen is scaphoid bowel sounds Extremities: Positive edema muscle wasting Neuro: Poorly responsive does not track or follow grunts occasionally GU: Scant urine  Resolved Hospital Problem list     Assessment & Plan:  Metabolic acidosis in the setting of hyperglycemia and poorly controlled diabetic with known noncompliance with medications.  Admit  to the intensive care unit Monitor lactic acid Monitor pH Correct hyperglycemia with insulin drip  Respiratory distress in the setting of severe acidosis possible pneumonia by chest x-ray.  Low threshold for  intubation. Empirical antimicrobial therapy Sputum culture if intubated Blood cultures x2 Oxygen as needed He may need to be intubated due to respiratory rate is extremely elevated in the setting of metabolic acidosis  Electrolyte imbalance in the setting of metabolic acidosis from glucose greater than 1400 but without ketones in urine. Renal insufficiency Recent Labs  Lab 08/07/19 1324 08/20/2019 0830 08/21/2019 0852  K 3.5 <2.0* <2.0*   Recent Labs  Lab 08/07/19 1324 08/23/2019 0830 08/03/2019 0852  NA 137 138 142   Lab Results  Component Value Date   CREATININE 1.28 (H) 08/03/2019   CREATININE 0.59 (L) 08/07/2019   CREATININE 0.58 (L) 04/10/2019   CREATININE 0.61 08/02/2011   CREATININE 0.83 01/06/2011   Aggressive fluid resuscitation per protocol Potassium repletion in in place Monitor electrolytes Watch pH for potassium shift  Reported seizure activity prior to admission Neurology consult Antiseizure medications per neurology Consider CT of the head due to altered mental status  History of schizophrenia Hold antipsychotic medications for now If intubated most likely need heavy sedation  Generalized failure to thrive in a 64 year old extremely cachectic male Education officer, museum consult From environment investigation May need skilled nursing facility   Best practice:  Diet: NPO Pain/Anxiety/Delirium protocol (if indicated): Currently altered mental status history of substance abuse monitor VAP protocol (if indicated): Currently on vancomycin and cefepime DVT prophylaxis: Sequential hose hold heparin until altered mental status is further evaluate the possible CT of the head GI prophylaxis: PPI Glucose control: IV insulin Mobility: Bedrest Code Status: Full Family Communication: No family at bedside Disposition: Admit to the intensive care unit pulmonary critical care service  Labs   CBC: Recent Labs  Lab 08/07/19 1324 08/16/2019 0830 08/17/2019 0852  WBC 7.5  22.3*  --   NEUTROABS  --  20.3*  --   HGB 14.2 14.2 15.6  HCT 40.4 44.9 46.0  MCV 91.8 100.2*  --   PLT 148* 156  --     Basic Metabolic Panel: Recent Labs  Lab 08/07/19 1324 07/26/2019 0830 07/24/2019 0852  NA 137 138 142  K 3.5 <2.0* <2.0*  CL 100 102  --   CO2 24 13*  --   GLUCOSE 666* 1,485*  --   BUN <5* <5*  --   CREATININE 0.59* 1.28*  --   CALCIUM 8.4* 9.0  --   MG  --  1.8  --    GFR: Estimated Creatinine Clearance: 48.6 mL/min (A) (by C-G formula based on SCr of 1.28 mg/dL (H)). Recent Labs  Lab 08/07/19 1324 08/07/2019 0830  WBC 7.5 22.3*  LATICACIDVEN  --  >11.0*    Liver Function Tests: Recent Labs  Lab 07/25/2019 0830  AST 46*  ALT 143*  ALKPHOS 377*  BILITOT 0.8  PROT 5.3*  ALBUMIN 2.9*   No results for input(s): LIPASE, AMYLASE in the last 168 hours. No results for input(s): AMMONIA in the last 168 hours.  ABG    Component Value Date/Time   PHART 7.428 03/30/2019 0635   PCO2ART 41.8 03/30/2019 0635   PO2ART 92.9 03/30/2019 0635   HCO3 16.5 (L) 08/01/2019 0852   TCO2 18 (L) 07/29/2019 0852   ACIDBASEDEF 15.0 (H) 07/27/2019 0852   O2SAT 82.0 07/26/2019 0852     Coagulation Profile: Recent Labs  Lab 08/15/2019  0830  INR 1.3*    Cardiac Enzymes: No results for input(s): CKTOTAL, CKMB, CKMBINDEX, TROPONINI in the last 168 hours.  HbA1C: Hgb A1c MFr Bld  Date/Time Value Ref Range Status  03/11/2019 08:00 PM 14.8 (H) 4.8 - 5.6 % Final    Comment:    (NOTE) Pre diabetes:          5.7%-6.4% Diabetes:              >6.4% Glycemic control for   <7.0% adults with diabetes   03/27/2017 03:17 AM 11.1 (H) 4.8 - 5.6 % Final    Comment:    (NOTE) Pre diabetes:          5.7%-6.4% Diabetes:              >6.4% Glycemic control for   <7.0% adults with diabetes     CBG: Recent Labs  Lab 08/07/19 1322 08/07/19 1837 08/04/2019 0833 07/24/2019 0946 08/13/2019 1022  GLUCAP 591* 269* >600* >600* >600*    Review of Systems:   NA  Past  Medical History  He,  has a past medical history of Anxiety, Arthritis, Bipolar disorder (HCC), Daily headache, Diabetic peripheral neuropathy (HCC), GERD (gastroesophageal reflux disease), History of hepatitis B virus infection conferring immunity (03/22/2018), Pancreatitis, Schizophrenia (HCC), Seizures (HCC) (01/2016 X 2), and Type II diabetes mellitus (HCC).   Surgical History    Past Surgical History:  Procedure Laterality Date  . BALLOON DILATION  03/22/2011   Procedure: BALLOON DILATION;  Surgeon: [name], MD;  Location: [facility] ENDOSCOPY;  Service: Endoscopy;  Laterality: N/A;  . COLONOSCOPY N/A 05/22/2012   Procedure: COLONOSCOPY;  Surgeon: [name], MD;  Location: [facility] ENDOSCOPY;  Service: Endoscopy;  Laterality: N/A;  . COLONOSCOPY W/ BIOPSIES AND POLYPECTOMY  09/12/11  . ESOPHAGOGASTRODUODENOSCOPY  03/22/2011   Procedure: ESOPHAGOGASTRODUODENOSCOPY (EGD);  Surgeon: [name], MD;  Location: [facility] ENDOSCOPY;  Service: Endoscopy;  Laterality: N/A;  egd with balloon   . EUS  04/20/2011   Procedure: UPPER ENDOSCOPIC ULTRASOUND (EUS) LINEAR;  Surgeon: Rachael Fee, MD;  Location: [facility] ENDOSCOPY;  Service: Endoscopy;  Laterality: N/A;  . KNEE ARTHROSCOPY Left      Social History   reports that he has been smoking cigarettes. He has a 21.00 pack-year smoking history. He has never used smokeless tobacco. He reports current alcohol use of about 2.0 standard drinks of alcohol per week. He reports previous drug use. Drug: Marijuana.   Family History   His family history includes Diabetes in his sister; Heart disease in his sister; Hypertension in his sister. There is no history of Colon cancer, Stomach cancer, Anesthesia problems, Hypotension, Pseudochol deficiency, or Malignant hyperthermia.   Allergies Allergies  Allergen Reactions  . Ace Inhibitors Swelling and Other (See Comments)    Angioedema - unquestionable  . Losartan Swelling and Other (See Comments)    Pt presented with  unquestionable angioedema on ACEIs (Angiotensin-converting enzyme (ACE) inhibitors) so aviod ARBs (Angiotensin receptor blockers), as well  . Tylenol [Acetaminophen] Other (See Comments)    "cannot have this" (takes Percocet at home)     Home Medications  Prior to Admission medications   Medication Sig Start Date End Date Taking? Authorizing Provider  albuterol (VENTOLIN HFA) 108 (90 Base) MCG/ACT inhaler Inhale 1 puff into the lungs every 4 (four) hours as needed for wheezing or shortness of breath.   Yes [provider]  ARIPiprazole (ABILIFY) 2 MG tablet Take 2 mg by mouth at bedtime.  Yes [provider]  BELBUCA 450 MCG FILM 450 mcg by Transmucosal route every 12 (twelve) hours. 07/14/19  Yes [provider]  DULoxetine (CYMBALTA) 30 MG capsule Take 30 mg by mouth daily.   Yes [provider]  feeding supplement, GLUCERNA SHAKE, (GLUCERNA SHAKE) LIQD Take 237 mLs by mouth 3 (three) times daily between meals. 03/30/17  Yes Emokpae, Courage, MD  folic acid (FOLVITE) 1 MG tablet Take 1 tablet (1 mg total) by mouth daily. 03/30/17  Yes Emokpae, Courage, MD  gabapentin (NEURONTIN) 600 MG tablet Take 600 mg by mouth 3 (three) times daily.  04/03/19  Yes [provider]  hydrOXYzine (ATARAX/VISTARIL) 25 MG tablet Take 25 mg by mouth 2 (two) times daily as needed for anxiety.    Yes [provider]  ibuprofen (ADVIL,MOTRIN) 800 MG tablet Take 800 mg by mouth 3 (three) times daily as needed for mild pain.  09/17/17  Yes [provider]  insulin aspart (NOVOLOG) 100 UNIT/ML FlexPen Take 5 units before breakfast,   before lunch, and 5 units before dinner (5 units before each meal) Patient taking differently: Inject 10 Units into the skin 3 (three) times daily with meals.  03/30/17  Yes Emokpae, Courage, MD  LEVEMIR FLEXTOUCH 100 UNIT/ML Pen 4 units Twice a day Patient taking differently: Inject 16 Units into the skin 2 (two) times daily.  03/30/17   Yes Emokpae, Courage, MD  lipase/protease/amylase (CREON) 36000 UNITS CPEP capsule TAKE 1 CAPSULE BY MOUTH 3  TIMES DAILY BEFORE MEALS Patient taking differently: Take 36,000 Units by mouth 3 (three) times daily before meals.  01/05/17  Yes Rice, Resa Miner, MD  magnesium citrate SOLN Take 2.5 mLs by mouth daily as needed for mild constipation or moderate constipation.  05/13/19  Yes [provider]  naloxone (NARCAN) nasal spray 4 mg/0.1 mL Place 1 spray into the nose daily as needed (opioid reversal).  07/13/17  Yes [provider]  oxyCODONE-acetaminophen (PERCOCET) 10-325 MG tablet Take 1 tablet by mouth every 8 (eight) hours as needed for pain.   Yes [provider]  pantoprazole (PROTONIX) 40 MG tablet Take 1 tablet (40 mg total) by mouth daily. Patient taking differently: Take 40 mg by mouth daily as needed (GERD).  03/30/17  Yes Emokpae, Courage, MD  Vitamin D, Ergocalciferol, (DRISDOL) 1.25 MG (50000 UNIT) CAPS capsule Take 50,000 Units by mouth once a week. 03/20/19  Yes [provider]  ACCU-CHEK SOFTCLIX LANCETS lancets Use 3 times daily to check blood sugar. diag code E11.42. Insulin dependent 03/30/17   Roxan Hockey, MD  Alcohol Swabs (B-D SINGLE USE SWABS REGULAR) PADS Patient uses 6 times a day. Patient tests 3 times per day and administers insulin 3 times a day. ICD 10 code E11.42 03/30/17   Roxan Hockey, MD  ammonium lactate (LAC-HYDRIN) 12 % cream Apply topically as needed for dry skin. Patient not taking: Reported on 08/13/2019 10/10/17   Gardiner Barefoot, DPM  Blood Glucose Calibration (ACCU-CHEK AVIVA) SOLN 1 drop by In Vitro route as needed. diag code E08.10 insulin dependent. Use as device directs 03/30/17   Roxan Hockey, MD  Blood Glucose Monitoring Suppl (ACCU-CHEK AVIVA PLUS) w/Device KIT 1 kit by Does not apply route every morning. diag code E11.42. Insulin dependent 03/30/17   Roxan Hockey, MD  diclofenac sodium (VOLTAREN) 1 % GEL  Apply 2 g topically 4 (four) times daily -  before meals and at bedtime. Patient not taking: Reported on 07/30/2019 01/05/17   Rice,  Resa Miner, MD  gabapentin (NEURONTIN) 300 MG capsule TAKE 3 CAPSULES BY MOUTH  EVERY MORNING AND  AFTERNOON. TAKE 4 CAPSULES  BY MOUTH AT NIGHT BEFORE  BED Patient not taking: Reported on 04/10/2019 01/05/17   Collier Salina, MD  glucose blood (ACCU-CHEK AVIVA PLUS) test strip Use 3 times daily to check blood sugar. diag code E11.42. Insulin dependent 03/30/17   Roxan Hockey, MD  Multiple Vitamin (MULTIVITAMIN WITH MINERALS) TABS tablet Take 1 tablet by mouth daily. Patient not taking: Reported on 07/26/2019 03/30/17   Roxan Hockey, MD  omega-3 acid ethyl esters (LOVAZA) 1 g capsule Take 1 capsule (1 g total) by mouth daily. Patient not taking: Reported on 08/03/2019 10/15/16   Sid Falcon, MD  pravastatin (PRAVACHOL) 10 MG tablet Take 1 tablet (10 mg total) by mouth daily at 6 PM. Patient not taking: Reported on 08/13/2019 01/05/17   Collier Salina, MD  thiamine (VITAMIN B-1) 100 MG tablet Take 1 tablet (100 mg total) by mouth daily. Patient not taking: Reported on 08/17/2019 03/30/17   Roxan Hockey, MD     Critical care time: 80    [place] Acute Care Nurse Practitioner [place] Please consult Amion 08/16/2019, 10:39 AM

## 2019-08-14 NOTE — Progress Notes (Signed)
Initial Nutrition Assessment  DOCUMENTATION CODES:   Severe malnutrition in context of chronic illness  INTERVENTION:   Initiate trickle tube feeds via OG tube: - Vital 1.5 @ 20 ml/hr (480 ml/day) - ProSource TF 45 ml daily - Free water flushes per CCM  Trickle tube feeding regimen provides 760 kcal, 43 grams of protein, and 367 ml of H2O (48% of kcal needs, 57% of protein needs).  Free water flushes of 100 ml q 8 hours provides an additional 300 ml free water.   Will monitor for ability to advance to goal tube feeding regimen: - Vital 1.5 @ 45 ml/hr (1080 ml/day) - ProSource TF 45 ml daily  Goal tube feeding regimen provides 1640 kcal, 84 grams of protein, and 825 ml of H2O.  NUTRITION DIAGNOSIS:   Severe Malnutrition related to chronic illness (poorly controlled T2DM, pancreatitis) as evidenced by moderate fat depletion, moderate muscle depletion, severe muscle depletion, percent weight loss (10.4% weight loss in 4 months).  GOAL:   Provide needs based on ASPEN/SCCM guidelines  MONITOR:   Vent status, Labs, Weight trends, TF tolerance  REASON FOR ASSESSMENT:   Ventilator    ASSESSMENT:   64 year old male who presented to the ED on 7/22 with respiratory distress after being found unresponsive on the floor by family. Pt had multiple seizures in the field. PMH of bipolar disorder, T2DM (poorly compliant with insulin regimen), GERD, pancreatitis, schizophrenia, seizures. Pt required intubation for airway protection.   Discussed pt with RN and during ICU rounds. Pt now off pressors per CCM note. Surgery following and no acute findings on abdominal exam.  OG tube in stomach per x-ray.  Received consult for trickle tube feeding initiation and management. CCM has ordered free water flushes of 100 ml q 8 hours.  EDW: 49.5 kg  Reviewed weight history in chart. Pt with a 6 kg weight loss over the last 4 months. This is a 10.4% weight loss which is severe and significant  for timeframe. Pt meets criteria for severe malnutrition.  Patient is currently intubated on ventilator support MV: 14.0 L/min Temp (24hrs), Avg:97.7 F (36.5 C), Min:96.2 F (35.7 C), Max:98.6 F (37 C) BP (cuff): 94/62 MAP (cuff): 72  Drips: D5: 60 ml/hr  Medications reviewed and include: colace, SSI q 4 hours, miralax, KCl 40 mEq q 4 hours, IV abx, Keppra, IV magnesium sulfate 2 grams once  Labs reviewed: sodium 152, potassium 3.5, magnesium 1.4 CBG's: 100-327 x 24 hours  UOP: 2550 ml x 24 hours I/O's: +1.9 L since admit  NUTRITION - FOCUSED PHYSICAL EXAM:    Most Recent Value  Orbital Region Moderate depletion  Upper Arm Region Moderate depletion  Thoracic and Lumbar Region Moderate depletion  Buccal Region Unable to assess  Temple Region Severe depletion  Clavicle Bone Region Severe depletion  Clavicle and Acromion Bone Region Severe depletion  Scapular Bone Region Unable to assess  Dorsal Hand Mild depletion  Patellar Region Moderate depletion  Anterior Thigh Region Severe depletion  Posterior Calf Region Severe depletion  Edema (RD Assessment) None  Hair Reviewed  Eyes Unable to assess  Mouth Unable to assess  Skin Reviewed  Nails Reviewed       Diet Order:   Diet Order            Diet NPO time specified  Diet effective now                 EDUCATION NEEDS:   No education needs have been identified  at this time  Skin:  Skin Assessment: Reviewed RN Assessment  Last BM:  no documented BM  Height:   Ht Readings from Last 1 Encounters:  08/18/2019 5\' 4"  (1.626 m)    Weight:   Wt Readings from Last 1 Encounters:  08/15/19 51.5 kg    Ideal Body Weight:  59.1 kg  BMI:  Body mass index is 19.49 kg/m.  Estimated Nutritional Needs:   Kcal:  1586  Protein:  75-90 grams  Fluid:  >/= 1.6 L    Gaynell Face, MS, RD, LDN Inpatient Clinical Dietitian Please see AMiON for contact information.

## 2019-08-14 NOTE — Procedures (Signed)
Intubation Procedure Note  Michael [name]  [id number]  12/28/1955  Date:08/16/2019  Time:2:47 PM   Provider Performing:Bessie Livingood    Procedure: Intubation (85277)  Indication(s) Respiratory Failure  Consent Unable to obtain consent due to emergent nature of procedure.   Anesthesia Versed, Fentanyl and Rocuronium   Time Out Verified patient identification, verified procedure, site/side was marked, verified correct patient position, special equipment/implants available, medications/allergies/relevant history reviewed, required imaging and test results available.   Sterile Technique Usual hand hygeine, masks, and gloves were used   Procedure Description Patient positioned in bed supine.  Sedation given as noted above.  Patient was intubated with endotracheal tube using MAC 3.  View was Grade 1 full glottis .  Number of attempts was 1.  Colorimetric CO2 detector was consistent with tracheal placement.   Complications/Tolerance None; patient tolerated the procedure well. Chest X-ray is ordered to verify placement.   EBL Minimal   Specimen(s) None

## 2019-08-14 NOTE — ED Notes (Signed)
Pt rectal temp 90.56F. Pt placed on BairHugger.

## 2019-08-14 NOTE — ED Triage Notes (Signed)
Patient arrives to ED with [facility] as a respiratory distress. Per EMS patient was found unresponsive laying on the floor by family. Family explains that patient was acting weird last night because he was crawling on the floor. Per EMS they placed NPA and started bagging patient. Patient had weak femoral pulses. Per EMS patient had seizure in back of truck, that was grand mal. Patient was given 10mg  Midazolam by EMS. On arrival to ED patient unresponsive, pt placed on 15L NRB, >600cbg. MD at bedside.

## 2019-08-14 NOTE — Progress Notes (Signed)
Responded to referral to continue support to patient and staff.  Patient is critical and presently being cared for by staff. Family is aware of patient status.  Chaplain available as needed.    Jaclynn Major, [place], [facility], Pager [phone]

## 2019-08-14 NOTE — Progress Notes (Signed)
Bear hugger applied 

## 2019-08-14 NOTE — Progress Notes (Signed)
RT NOTE: Critical ABG results were given to Dr Tacy Learn. VT was increased to 500 and RR increased to 28 per orders.

## 2019-08-14 NOTE — ED Notes (Signed)
Patient returned from [place]. At CT EJ redressed. Next Bolus and K + started.

## 2019-08-14 NOTE — Progress Notes (Signed)
Pharmacy Antibiotic Note  Michael [name] is a 64 y.o. male admitted on 08/20/2019 with infection of unknown source.  Pharmacy has been consulted for vancomycin and cefepime dosing. SCr 1.28 on admit.  Plan: Cefepime 2g IV q12h Vancomycin 1250mg  IV x1; then 500mg  IV q12h Monitor clinical progress, c/s, renal function F/u de-escalation plan/LOT, vancomycin levels as indicated      Temp (24hrs), Avg:90.1 F (32.3 C), Min:90.1 F (32.3 C), Max:90.1 F (32.3 C)  Recent Labs  Lab 08/07/19 1324 08/02/2019 0830  WBC 7.5 22.3*  CREATININE 0.59* 1.28*  LATICACIDVEN  --  >11.0*    CrCl cannot be calculated (Unknown ideal weight.).   Allergies  Allergen Reactions  . Ace Inhibitors Swelling and Other (See Comments)    Angioedema - unquestionable  . Losartan Swelling and Other (See Comments)    Pt presented with unquestionable angioedema on ACEIs (Angiotensin-converting enzyme (ACE) inhibitors) so aviod ARBs (Angiotensin receptor blockers), as well  . Tylenol [Acetaminophen] Other (See Comments)    "cannot have this" (takes Percocet at home)    Arturo Morton, PharmD, BCPS Please check AMION for all [place] contact numbers Clinical Pharmacist 08/07/2019 10:53 AM

## 2019-08-14 NOTE — ED Provider Notes (Signed)
[facility] EMERGENCY DEPARTMENT Provider Note   CSN: [id number] Arrival date & time: 08/21/2019  5277     History Chief Complaint  Patient presents with  . Respiratory Distress  . Seizures    Michael [name] is a 64 y.o. male.  Patient presents via EMS s/p seizure with altered mental status. EMS indicate called to home for altered mental status, weakness, and possible opiate overuse/overdose. EMS indicates pt found lying in floor, breathing, with pulses, but not verbally responsive, and not following commands. They indicate pt was capable of turning side to side but markedly altered. CBG was in 400s. There was no report of trauma/fall, no report of fevers. EMS indicates family did say patient was acting strange last night, crawling to get around. Patient is not verbally responsive - level 5 caveat. In route to hospital EMS notes generalized tonic clonic seizure, lasted several minutes, EMS did give versed 10 mg IM.  The history is provided by the patient and the EMS personnel. The history is limited by the condition of the patient.  Seizures      Past Medical History:  Diagnosis Date  . Anxiety   . Arthritis    "joints; shoulders; feet" (06/08/2016)  . Bipolar disorder ([place])   . Daily headache    "7:30 - 8:00 q night" (06/08/2016)  . Diabetic peripheral neuropathy ([place])   . GERD (gastroesophageal reflux disease)   . History of hepatitis B virus infection conferring immunity 03/22/2018  . Pancreatitis   . Schizophrenia ([place])   . Seizures ([place]) 01/2016 X 2  . Type II diabetes mellitus [facility])     Patient Active Problem List   Diagnosis Date Noted  . Diabetic hypoglycemia ([place]) 04/09/2019  . CAP (community acquired pneumonia) 04/09/2019  . Diabetic ulcer of right foot ([place]) - dorsum of right foot 04/09/2019  . Hypothermia 03/10/2019  . Sepsis with acute organ dysfunction ([place]) 03/10/2019  . Seizure ([place]) 02/26/2019  . History of hepatitis B virus infection  conferring immunity 03/22/2018  . Alcohol use disorder, severe, dependence ([place]) 12/29/2017  . Malnutrition of moderate degree 03/30/2017  . Hypoglycemia due to insulin 03/26/2017  . Acute metabolic encephalopathy 82/42/3536  . Other constipation 10/16/2016  . Recurrent falls 07/07/2016  . Wernicke's encephalopathy   . Bipolar disorder ([place]) 06/01/2016  . Dry skin 01/18/2016  . Peripheral vascular disease ([place]) 11/15/2015  . Hyperglycemia 09/02/2015  . Tobacco abuse 09/02/2015  . Tricompartment osteoarthritis of both knees 06/15/2015  . Vitamin D deficiency 03/12/2015  . Hyperlipidemia 02/17/2015  . Hypokalemia 12/05/2014  . Protein-calorie malnutrition, severe 11/18/2014  . Alcohol use   . Cervical arthritis 01/01/2014  . Health care maintenance 08/25/2011  . Chronic alcoholic pancreatitis ([place]) 04/20/2011  . Type 2 diabetes mellitus with peripheral neuropathy ([place]) 12/09/2010  . HTN (hypertension) 12/09/2010    Past Surgical History:  Procedure Laterality Date  . BALLOON DILATION  03/22/2011   Procedure: BALLOON DILATION;  Surgeon: Gatha Mayer, MD;  Location: [facility] ENDOSCOPY;  Service: Endoscopy;  Laterality: N/A;  . COLONOSCOPY N/A 05/22/2012   Procedure: COLONOSCOPY;  Surgeon: Gatha Mayer, MD;  Location: [facility];  Service: Endoscopy;  Laterality: N/A;  . COLONOSCOPY W/ BIOPSIES AND POLYPECTOMY  09/12/11  . ESOPHAGOGASTRODUODENOSCOPY  03/22/2011   Procedure: ESOPHAGOGASTRODUODENOSCOPY (EGD);  Surgeon: Gatha Mayer, MD;  Location: [facility];  Service: Endoscopy;  Laterality: N/A;  egd with balloon   . EUS  04/20/2011   Procedure: UPPER ENDOSCOPIC ULTRASOUND (EUS)  LINEAR;  Surgeon: Milus Banister, MD;  Location: [facility];  Service: Endoscopy;  Laterality: N/A;  . KNEE ARTHROSCOPY Left        Family History  Problem Relation Age of Onset  . Hypertension Sister   . Diabetes Sister   . Heart disease Sister   . Colon cancer Neg Hx   . Stomach cancer Neg Hx     . Anesthesia problems Neg Hx   . Hypotension Neg Hx   . Pseudochol deficiency Neg Hx   . Malignant hyperthermia Neg Hx     Social History   Tobacco Use  . Smoking status: Current Every Day Smoker    Packs/day: 0.50    Years: 42.00    Pack years: 21.00    Types: Cigarettes  . Smokeless tobacco: Never Used  . Tobacco comment: Sometimes less.  Vaping Use  . Vaping Use: Never used  Substance Use Topics  . Alcohol use: Yes    Alcohol/week: 2.0 standard drinks    Types: 2 Cans of beer per week  . Drug use: Not Currently    Types: Marijuana    Comment: Sober for 2 years    Home Medications Prior to Admission medications   Medication Sig Start Date End Date Taking? Authorizing Provider  ACCU-CHEK SOFTCLIX LANCETS lancets Use 3 times daily to check blood sugar. diag code E11.42. Insulin dependent 03/30/17   Emokpae, Courage, MD  albuterol (VENTOLIN HFA) 108 (90 Base) MCG/ACT inhaler Inhale 1 puff into the lungs every 4 (four) hours as needed for wheezing or shortness of breath.    [provider]  Alcohol Swabs (B-D SINGLE USE SWABS REGULAR) PADS Patient uses 6 times a day. Patient tests 3 times per day and administers insulin 3 times a day. ICD 10 code E11.42 03/30/17   Roxan Hockey, MD  ammonium lactate (LAC-HYDRIN) 12 % cream Apply topically as needed for dry skin. 10/10/17   Gardiner Barefoot, DPM  ARIPiprazole (ABILIFY) 2 MG tablet Take 2 mg by mouth at bedtime.     [provider]  aspirin EC 81 MG tablet Take 81 mg by mouth daily.     [provider]  Blood Glucose Calibration (ACCU-CHEK AVIVA) SOLN 1 drop by In Vitro route as needed. diag code E08.10 insulin dependent. Use as device directs 03/30/17   Roxan Hockey, MD  Blood Glucose Monitoring Suppl (ACCU-CHEK AVIVA PLUS) w/Device KIT 1 kit by Does not apply route every morning. diag code E11.42. Insulin dependent 03/30/17   Roxan Hockey, MD  Buprenorphine HCl (BELBUCA) 900 MCG FILM Place 900 mcg  inside cheek every 12 (twelve) hours.     [provider]  diclofenac sodium (VOLTAREN) 1 % GEL Apply 2 g topically 4 (four) times daily -  before meals and at bedtime. 01/05/17   Rice, Resa Miner, MD  DULoxetine (CYMBALTA) 30 MG capsule Take 30 mg by mouth daily.    [provider]  feeding supplement, GLUCERNA SHAKE, (GLUCERNA SHAKE) LIQD Take 237 mLs by mouth 3 (three) times daily between meals. 03/30/17   Roxan Hockey, MD  folic acid (FOLVITE) 1 MG tablet Take 1 tablet (1 mg total) by mouth daily. 03/30/17   Roxan Hockey, MD  gabapentin (NEURONTIN) 300 MG capsule TAKE 3 CAPSULES BY MOUTH  EVERY MORNING AND  AFTERNOON. TAKE 4 CAPSULES  BY MOUTH AT NIGHT BEFORE  BED Patient not taking: Reported on 04/10/2019 01/05/17   Collier Salina, MD  gabapentin (NEURONTIN) 600 MG tablet Take  600 mg by mouth 3 (three) times daily.  04/03/19   [provider]  glipiZIDE (GLUCOTROL) 5 MG tablet Take 5 mg by mouth daily before breakfast.    [provider]  glucose blood (ACCU-CHEK AVIVA PLUS) test strip Use 3 times daily to check blood sugar. diag code E11.42. Insulin dependent 03/30/17   Roxan Hockey, MD  hydrOXYzine (ATARAX/VISTARIL) 25 MG tablet Take 25 mg by mouth 2 (two) times daily as needed for anxiety.     [provider]  ibuprofen (ADVIL,MOTRIN) 800 MG tablet Take 800 mg by mouth 3 (three) times daily as needed for mild pain.  09/17/17   [provider]  insulin aspart (NOVOLOG) 100 UNIT/ML FlexPen Take 5 units before breakfast,   before lunch, and 5 units before dinner (5 units before each meal) Patient taking differently: Inject 10 Units into the skin 3 (three) times daily with meals.  03/30/17   Emokpae, Courage, MD  LEVEMIR FLEXTOUCH 100 UNIT/ML Pen 4 units Twice a day Patient taking differently: Inject 16 Units into the skin 2 (two) times daily.  03/30/17   Roxan Hockey, MD  lipase/protease/amylase (CREON) 36000 UNITS CPEP capsule  TAKE 1 CAPSULE BY MOUTH 3  TIMES DAILY BEFORE MEALS 01/05/17   Rice, Resa Miner, MD  Multiple Vitamin (MULTIVITAMIN WITH MINERALS) TABS tablet Take 1 tablet by mouth daily. 03/30/17   Roxan Hockey, MD  naloxone [facility]) nasal spray 4 mg/0.1 mL Place 1 spray into the nose daily as needed (For overdose per paitent).  07/13/17   [provider]  omega-3 acid ethyl esters (LOVAZA) 1 g capsule Take 1 capsule (1 g total) by mouth daily. 10/15/16   Sid Falcon, MD  oxyCODONE-acetaminophen (PERCOCET) 10-325 MG tablet Take 1 tablet by mouth every 8 (eight) hours as needed for pain.    [provider]  pantoprazole (PROTONIX) 40 MG tablet Take 1 tablet (40 mg total) by mouth daily. 03/30/17   Roxan Hockey, MD  pravastatin (PRAVACHOL) 10 MG tablet Take 1 tablet (10 mg total) by mouth daily at 6 PM. 01/05/17   Rice, Resa Miner, MD  QUEtiapine Fumarate (SEROQUEL XR) 150 MG 24 hr tablet Take 150 mg by mouth at bedtime.  07/20/17   [provider]  thiamine (VITAMIN B-1) 100 MG tablet Take 1 tablet (100 mg total) by mouth daily. 03/30/17   Roxan Hockey, MD  Vitamin D, Ergocalciferol, (DRISDOL) 1.25 MG (50000 UNIT) CAPS capsule Take 50,000 Units by mouth once a week. 03/20/19   [provider]    Allergies    Ace inhibitors, Losartan, and Tylenol [acetaminophen]  Review of Systems   Review of Systems  Unable to perform ROS: Mental status change  Neurological: Positive for seizures.  level 5 caveat -  Altered mental status  Physical Exam Updated Vital Signs BP 94/78   Pulse (!) 120   Temp (!) 90.1 F (32.3 C) (Rectal)   Resp (!) 22   SpO2 90%   Physical Exam Vitals and nursing note reviewed.  Constitutional:      Appearance: Normal appearance. He is well-developed.  HENT:     Head: Atraumatic.     Nose: Nose normal.     Mouth/Throat:     Mouth: Mucous membranes are moist.     Pharynx: Oropharynx is clear.  Eyes:     General: No scleral  icterus.    Conjunctiva/sclera: Conjunctivae normal.     Pupils: Pupils are equal, round, and reactive to light.  Neck:     Trachea: No tracheal deviation.  Cardiovascular:     Rate and Rhythm: Regular rhythm. Tachycardia present.     Pulses: Normal pulses.     Heart sounds: Normal heart sounds. No murmur heard.  No friction rub. No gallop.   Pulmonary:     Effort: Pulmonary effort is normal. No accessory muscle usage or respiratory distress.     Breath sounds: Normal breath sounds.  Abdominal:     General: Bowel sounds are normal. There is no distension.     Palpations: Abdomen is soft.     Tenderness: There is no abdominal tenderness. There is no guarding.  Genitourinary:    Comments: No cva tenderness. Musculoskeletal:        General: No swelling.     Cervical back: Normal range of motion and neck supple. No rigidity.     Comments: CTLS spine aligned, not appreciably tender, no step off. No focal sts or tenderness on bil extremity exam. Femoral and distal pulses palp bil.   Skin:    General: Skin is warm and dry.     Findings: No rash.  Neurological:     Mental Status: He is alert.     Comments: Patients eyes open, pupils equal, miotic. Patient does not follow commands, but appears to occasionally moves extremities purposefully.   Psychiatric:     Comments: Markedly altered, poorly responsive.      ED Results / Procedures / Treatments   Labs (all labs ordered are listed, but only abnormal results are displayed) Results for orders placed or performed during the hospital encounter of 07/29/2019  SARS Coronavirus 2 by RT PCR (hospital order, performed in [place] hospital lab) Nasopharyngeal Nasopharyngeal Swab   Specimen: Nasopharyngeal Swab  Result Value Ref Range   SARS Coronavirus 2 NEGATIVE NEGATIVE  CBC with Differential/Platelet  Result Value Ref Range   WBC 22.3 (H) 4.0 - 10.5 K/uL   RBC 4.48 4.22 - 5.81 MIL/uL   Hemoglobin 14.2 13.0 - 17.0 g/dL   HCT 44.9 39 -  52 %   MCV 100.2 (H) 80.0 - 100.0 fL   MCH 31.7 26.0 - 34.0 pg   MCHC 31.6 30.0 - 36.0 g/dL   RDW 14.6 11.5 - 15.5 %   Platelets 156 150 - 400 K/uL   nRBC 0.0 0.0 - 0.2 %   Neutrophils Relative % 91 %   Neutro Abs 20.3 (H) 1.7 - 7.7 K/uL   Lymphocytes Relative 5 %   Lymphs Abs 1.1 0.7 - 4.0 K/uL   Monocytes Relative 3 %   Monocytes Absolute 0.8 0 - 1 K/uL   Eosinophils Relative 0 %   Eosinophils Absolute 0.0 0 - 0 K/uL   Basophils Relative 0 %   Basophils Absolute 0.0 0 - 0 K/uL   Immature Granulocytes 1 %   Abs Immature Granulocytes 0.13 (H) 0.00 - 0.07 K/uL  Protime-INR  Result Value Ref Range   Prothrombin Time 15.7 (H) 11.4 - 15.2 seconds   INR 1.3 (H) 0.8 - 1.2  APTT  Result Value Ref Range   aPTT 32 24 - 36 seconds  Comprehensive metabolic panel  Result Value Ref Range   Sodium 138 135 - 145 mmol/L   Potassium <2.0 (LL) 3.5 - 5.1 mmol/L   Chloride 102 98 - 111 mmol/L   CO2 13 (L) 22 - 32 mmol/L   Glucose, Bld 1,485 (HH) 70 - 99 mg/dL   BUN <5 (L) 8 - 23  mg/dL   Creatinine, Ser 1.28 (H) 0.61 - 1.24 mg/dL   Calcium 9.0 8.9 - 10.3 mg/dL   Total Protein 5.3 (L) 6.5 - 8.1 g/dL   Albumin 2.9 (L) 3.5 - 5.0 g/dL   AST 46 (H) 15 - 41 U/L   ALT 143 (H) 0 - 44 U/L   Alkaline Phosphatase 377 (H) 38 - 126 U/L   Total Bilirubin 0.8 0.3 - 1.2 mg/dL   GFR calc non Af Amer >60 >60 mL/min   GFR calc Af Amer >60 >60 mL/min   Anion gap 23 (H) 5 - 15  Lactic acid, plasma  Result Value Ref Range   Lactic Acid, Venous >11.0 (HH) 0.5 - 1.9 mmol/L  Lactic acid, plasma  Result Value Ref Range   Lactic Acid, Venous 10.7 (HH) 0.5 - 1.9 mmol/L  Ethanol  Result Value Ref Range   Alcohol, Ethyl (B) <10 <10 mg/dL  Rapid urine drug screen (hospital performed)  Result Value Ref Range   Opiates NONE DETECTED NONE DETECTED   Cocaine NONE DETECTED NONE DETECTED   Benzodiazepines NONE DETECTED NONE DETECTED   Amphetamines NONE DETECTED NONE DETECTED   Tetrahydrocannabinol NONE DETECTED  NONE DETECTED   Barbiturates NONE DETECTED NONE DETECTED  Urinalysis, Routine w reflex microscopic  Result Value Ref Range   Color, Urine STRAW (A) YELLOW   APPearance CLEAR CLEAR   Specific Gravity, Urine 1.025 1.005 - 1.030   pH 6.0 5.0 - 8.0   Glucose, UA >=500 (A) NEGATIVE mg/dL   Hgb urine dipstick MODERATE (A) NEGATIVE   Bilirubin Urine NEGATIVE NEGATIVE   Ketones, ur NEGATIVE NEGATIVE mg/dL   Protein, ur NEGATIVE NEGATIVE mg/dL   Nitrite NEGATIVE NEGATIVE   Leukocytes,Ua NEGATIVE NEGATIVE   RBC / HPF 0-5 0 - 5 RBC/hpf   WBC, UA 0-5 0 - 5 WBC/hpf   Bacteria, UA RARE (A) NONE SEEN  Magnesium  Result Value Ref Range   Magnesium 1.8 1.7 - 2.4 mg/dL  Beta-hydroxybutyric acid  Result Value Ref Range   Beta-Hydroxybutyric Acid 0.11 0.05 - 0.27 mmol/L  Comprehensive metabolic panel  Result Value Ref Range   Sodium 132 (L) 135 - 145 mmol/L   Potassium >7.5 (HH) 3.5 - 5.1 mmol/L   Chloride 110 98 - 111 mmol/L   CO2 16 (L) 22 - 32 mmol/L   Glucose, Bld 1,073 (HH) 70 - 99 mg/dL   BUN <5 (L) 8 - 23 mg/dL   Creatinine, Ser 1.15 0.61 - 1.24 mg/dL   Calcium 7.8 (L) 8.9 - 10.3 mg/dL   Total Protein 4.7 (L) 6.5 - 8.1 g/dL   Albumin 2.5 (L) 3.5 - 5.0 g/dL   AST 46 (H) 15 - 41 U/L   ALT 128 (H) 0 - 44 U/L   Alkaline Phosphatase 308 (H) 38 - 126 U/L   Total Bilirubin 0.6 0.3 - 1.2 mg/dL   GFR calc non Af Amer >60 >60 mL/min   GFR calc Af Amer >60 >60 mL/min   Anion gap 6 5 - 15  Magnesium  Result Value Ref Range   Magnesium 1.4 (L) 1.7 - 2.4 mg/dL  Phosphorus  Result Value Ref Range   Phosphorus 1.2 (L) 2.5 - 4.6 mg/dL  CBC  Result Value Ref Range   WBC 15.3 (H) 4.0 - 10.5 K/uL   RBC 4.21 (L) 4.22 - 5.81 MIL/uL   Hemoglobin 13.3 13.0 - 17.0 g/dL   HCT 40.5 39 - 52 %   MCV 96.2 80.0 -  100.0 fL   MCH 31.6 26.0 - 34.0 pg   MCHC 32.8 30.0 - 36.0 g/dL   RDW 14.1 11.5 - 15.5 %   Platelets 120 (L) 150 - 400 K/uL   nRBC 0.0 0.0 - 0.2 %  Protime-INR  Result Value Ref Range    Prothrombin Time 16.0 (H) 11.4 - 15.2 seconds   INR 1.3 (H) 0.8 - 1.2  APTT  Result Value Ref Range   aPTT 32 24 - 36 seconds  Lipase, blood  Result Value Ref Range   Lipase 19 11 - 51 U/L  Amylase  Result Value Ref Range   Amylase 383 (H) 28 - 100 U/L  TSH  Result Value Ref Range   TSH 0.487 0.350 - 4.500 uIU/mL  Procalcitonin  Result Value Ref Range   Procalcitonin 0.99 ng/mL  CBG monitoring, ED  Result Value Ref Range   Glucose-Capillary >600 (HH) 70 - 99 mg/dL   Comment 1 Notify RN    Comment 2 Document in Chart   CBG monitoring, ED  Result Value Ref Range   Glucose-Capillary >600 (HH) 70 - 99 mg/dL  I-Stat Venous Blood Gas, ED  Result Value Ref Range   pH, Ven 7.020 (LL) 7.25 - 7.43   pCO2, Ven 63.7 (H) 44 - 60 mmHg   pO2, Ven 69.0 (H) 32 - 45 mmHg   Bicarbonate 16.5 (L) 20.0 - 28.0 mmol/L   TCO2 18 (L) 22 - 32 mmol/L   O2 Saturation 82.0 %   Acid-base deficit 15.0 (H) 0.0 - 2.0 mmol/L   Sodium 142 135 - 145 mmol/L   Potassium <2.0 (LL) 3.5 - 5.1 mmol/L   Calcium, Ion 1.27 1.15 - 1.40 mmol/L   HCT 46.0 39 - 52 %   Hemoglobin 15.6 13.0 - 17.0 g/dL   Sample type VENOUS   CBG monitoring, ED  Result Value Ref Range   Glucose-Capillary >600 (HH) 70 - 99 mg/dL  I-Stat arterial blood gas, ED  Result Value Ref Range   pH, Arterial 7.223 (L) 7.35 - 7.45   pCO2 arterial 33.6 32 - 48 mmHg   pO2, Arterial 70 (L) 83 - 108 mmHg   Bicarbonate 14.8 (L) 20.0 - 28.0 mmol/L   TCO2 16 (L) 22 - 32 mmol/L   O2 Saturation 95.0 %   Acid-base deficit 13.0 (H) 0.0 - 2.0 mmol/L   Sodium 147 (H) 135 - 145 mmol/L   Potassium <2.0 (LL) 3.5 - 5.1 mmol/L   Calcium, Ion 1.37 1.15 - 1.40 mmol/L   HCT 43.0 39 - 52 %   Hemoglobin 14.6 13.0 - 17.0 g/dL   Patient temperature 90.1 F    Sample type ARTERIAL   I-Stat arterial blood gas, ED  Result Value Ref Range   pH, Arterial 7.114 (LL) 7.35 - 7.45   pCO2 arterial 47.3 32 - 48 mmHg   pO2, Arterial 419 (H) 83 - 108 mmHg   Bicarbonate  15.2 (L) 20.0 - 28.0 mmol/L   TCO2 17 (L) 22 - 32 mmol/L   O2 Saturation 100.0 %   Acid-base deficit 14.0 (H) 0.0 - 2.0 mmol/L   Sodium 140 135 - 145 mmol/L   Potassium <2.0 (LL) 3.5 - 5.1 mmol/L   Calcium, Ion 1.29 1.15 - 1.40 mmol/L   HCT 44.0 39 - 52 %   Hemoglobin 15.0 13.0 - 17.0 g/dL   Sample type ARTERIAL   CBG monitoring, ED  Result Value Ref Range   Glucose-Capillary >600 (HH) 70 -  99 mg/dL  Troponin I (High Sensitivity)  Result Value Ref Range   Troponin I (High Sensitivity) 24 (H) <18 ng/L  Troponin I (High Sensitivity)  Result Value Ref Range   Troponin I (High Sensitivity) 39 (H) <18 ng/L   DG Chest Port 1 View  Result Date: 08/08/2019 CLINICAL DATA:  SOB,weakness EXAM: PORTABLE CHEST 1 VIEW COMPARISON:  Chest radiograph 06/09/2019 FINDINGS: Stable cardiomediastinal contours. Low lung volumes. Possible subtle opacity in the right lower lung. The left lung is clear. No pneumothorax or significant pleural effusion. Large area of lucency in the left upper quadrant possibly representing gastric distention. No acute finding in the visualized skeleton. IMPRESSION: 1. Possible subtle opacity in the right lower lung. Evaluation somewhat limited by low lung volumes. 2. Large area of lucency in the left upper quadrant possibly representing gastric distention. Recommend dedicated abdominal radiographs. Electronically Signed   By: Audie Pinto M.D.   On: 08/16/2019 09:01    EKG EKG Interpretation  Date/Time:  Thursday August 14 2019 08:28:01 EDT Ventricular Rate:  125 PR Interval:    QRS Duration: 85 QT Interval:  289 QTC Calculation: 417 R Axis:   43 Text Interpretation: Sinus tachycardia Non-specific ST-t changes Confirmed by Lajean Saver [phone]) on 08/10/2019 8:31:41 AM   Radiology DG Chest Port 1 View  Result Date: 08/08/2019 CLINICAL DATA:  SOB,weakness EXAM: PORTABLE CHEST 1 VIEW COMPARISON:  Chest radiograph 06/09/2019 FINDINGS: Stable cardiomediastinal contours.  Low lung volumes. Possible subtle opacity in the right lower lung. The left lung is clear. No pneumothorax or significant pleural effusion. Large area of lucency in the left upper quadrant possibly representing gastric distention. No acute finding in the visualized skeleton. IMPRESSION: 1. Possible subtle opacity in the right lower lung. Evaluation somewhat limited by low lung volumes. 2. Large area of lucency in the left upper quadrant possibly representing gastric distention. Recommend dedicated abdominal radiographs. Electronically Signed   By: Audie Pinto M.D.   On: 08/07/2019 09:01    Procedures Procedures (including critical care time)  Medications Ordered in ED Medications  insulin regular, human (MYXREDLIN) 100 units/ 100 mL infusion (5.5 Units/hr Intravenous New Bag/Given 08/19/2019 0859)  0.9 %  sodium chloride infusion (has no administration in time range)  dextrose 5 %-0.45 % sodium chloride infusion (has no administration in time range)  dextrose 50 % solution 0-50 mL (has no administration in time range)  potassium chloride 10 mEq in 100 mL IVPB (has no administration in time range)  sodium chloride 0.9 % bolus 1,000 mL (1,000 mLs Intravenous New Bag/Given 07/27/2019 0901)  naloxone (NARCAN) injection (2 mg Intravenous Given 07/31/2019 0827)    ED Course  I have reviewed the triage vital signs and the nursing notes.  Pertinent labs & imaging results that were available during my care of the patient were reviewed by me and considered in my medical decision making (see chart for details).    MDM Rules/Calculators/A&P                         IV ns. Continuous pulse ox and monitor. Stat labs. o2 [place]. Resp therapy consulted to assist with airway, suction prn.   Given miotic pupils, EMS report of possible concern for opiate overdose, narcan iv  - no immediate change in mental status noted.   2nd iv line - pt currently with RUE IV, and a right EJ, and RN staff working on additional  access.    MDM Number of  Diagnoses or Management Options   Amount and/or Complexity of Data Reviewed Clinical lab tests: ordered and reviewed Tests in the radiology section of CPT: ordered and reviewed Tests in the medicine section of CPT: ordered and reviewed Discussion of test results with the performing providers: yes Decide to obtain previous medical records or to obtain history from someone other than the patient: yes Obtain history from someone other than the patient: yes Review and summarize past medical records: yes Discuss the patient with other providers: yes Independent visualization of images, tracings, or specimens: yes  Risk of Complications, Morbidity, and/or Mortality Presenting problems: high Diagnostic procedures: high Management options: high   Reviewed nursing notes and prior charts for additional history. Prior admissions reviewed. Hx hyperglycemia and hypoglycemia. Hx seizures felt possible related to hypoglycemia, metabolic abn, or etoh in past.   Initial labs reviewed/interpreted by me - k very low. Mg added to labs, runs of iv K ordered. Glucose > 700, insulin gtt, iv ns boluses, per ED hyperglycemic crisis and Endotool orderset.   Continual/frequent reassessments of airway, resp status, and neurologic status. Patient is looking about room, looks at providers. +intact gag, clears secretions.   Additional labs reviewed/interpreted by me - ABG with low ph (pt with recent prolonged seizure, as well as severe hyperglycemia), PCO2 47, and PO2 411.  Pts rectal temp low, bair hugger applied.  Given unavailable hx from pt, low temp, met abn, etc, will also add cultures and empirically tx w iv abx.   Additional labs reviewed/interpreted by me - glucose severely high, 1485.   CXR reviewed/interpreted by me - ?infiltrate, ?asp vs pna.  Also large amt air in stomach (EMS/fire dept had been bagging patient pta).   Critical care consulted for admission. Discussed pt  - will admit.   Neurology consulted - discussed pt, labs, hx - requests keppra 20 mg/kg iv, EEG, and they will consult.   Keppra iv. Stat EEG ordered.   CRITICAL CARE RE: severe DKA/severe hyperglycemia, severe dehydration, severe hypokalemia, encephalopathy Performed by: Mirna Mires Total critical care time: 180 minutes Critical care time was exclusive of separately billable procedures and treating other patients. Critical care was necessary to treat or prevent imminent or life-threatening deterioration. Critical care was time spent personally by me on the following activities: development of treatment plan with patient and/or surrogate as well as nursing, discussions with consultants, evaluation of patient's response to treatment, examination of patient, obtaining history from patient or surrogate, ordering and performing treatments and interventions, ordering and review of laboratory studies, ordering and review of radiographic studies, pulse oximetry and re-evaluation of patient's condition.       Final Clinical Impression(s) / ED Diagnoses Final diagnoses:  None    Rx / DC Orders ED Discharge Orders    None       Lajean Saver, MD 07/31/2019 1312

## 2019-08-14 NOTE — Progress Notes (Signed)
RT NOTE: ETT pulled back 3cm per MD order. ETT currently 22 cmH2O @ lip.

## 2019-08-14 NOTE — ED Notes (Signed)
Patient transported to CT With RN 

## 2019-08-14 NOTE — Procedures (Signed)
Central Venous Catheter Insertion Procedure Note  Michael [name]  [id number]  08/10/1955  Date:08/13/2019  Time:2:59 PM   Provider Performing:Pete Johnette Abraham Kary Kos   Procedure: Insertion of Non-tunneled Central Venous Catheter(36556) with US guidance (08657)   Indication(s) Medication administration and Difficult access  Consent Unable to obtain consent due to emergent nature of procedure.  Anesthesia Topical only with 1% lidocaine   Timeout Verified patient identification, verified procedure, site/side was marked, verified correct patient position, special equipment/implants available, medications/allergies/relevant history reviewed, required imaging and test results available.  Sterile Technique Maximal sterile technique including full sterile barrier drape, hand hygiene, sterile gown, sterile gloves, mask, hair covering, sterile ultrasound probe cover (if used).  Procedure Description Area of catheter insertion was cleaned with chlorhexidine and draped in sterile fashion.  With real-time ultrasound guidance a central venous catheter was placed into the left internal jugular vein. Nonpulsatile blood flow and easy flushing noted in all ports.  The catheter was sutured in place and sterile dressing applied.  Complications/Tolerance None; patient tolerated the procedure well. Chest X-ray is ordered to verify placement for internal jugular or subclavian cannulation.   Chest x-ray is not ordered for femoral cannulation.  EBL Minimal  Specimen(s) None Erick Colace ACNP-BC [place] Pager # [phone] OR # [phone] if no answer

## 2019-08-15 ENCOUNTER — Inpatient Hospital Stay (HOSPITAL_COMMUNITY): Payer: Medicare HMO

## 2019-08-15 DIAGNOSIS — E1101 Type 2 diabetes mellitus with hyperosmolarity with coma: Secondary | ICD-10-CM

## 2019-08-15 DIAGNOSIS — J690 Pneumonitis due to inhalation of food and vomit: Secondary | ICD-10-CM

## 2019-08-15 DIAGNOSIS — E876 Hypokalemia: Secondary | ICD-10-CM

## 2019-08-15 DIAGNOSIS — Z9911 Dependence on respirator [ventilator] status: Secondary | ICD-10-CM

## 2019-08-15 DIAGNOSIS — G9341 Metabolic encephalopathy: Secondary | ICD-10-CM

## 2019-08-15 DIAGNOSIS — E870 Hyperosmolality and hypernatremia: Secondary | ICD-10-CM

## 2019-08-15 DIAGNOSIS — J9601 Acute respiratory failure with hypoxia: Secondary | ICD-10-CM

## 2019-08-15 LAB — MAGNESIUM
Magnesium: 1.2 mg/dL — ABNORMAL LOW (ref 1.7–2.4)
Magnesium: 1.8 mg/dL (ref 1.7–2.4)

## 2019-08-15 LAB — BASIC METABOLIC PANEL
Anion gap: 9 (ref 5–15)
Anion gap: 7 (ref 5–15)
Anion gap: 7 (ref 5–15)
Anion gap: 7 (ref 5–15)
Anion gap: 7 (ref 5–15)
BUN: <5 mg/dL — ABNORMAL LOW (ref 8–23)
BUN: <5 mg/dL — ABNORMAL LOW (ref 8–23)
BUN: <5 mg/dL — ABNORMAL LOW (ref 8–23)
BUN: 6 mg/dL — ABNORMAL LOW (ref 8–23)
BUN: 6 mg/dL — ABNORMAL LOW (ref 8–23)
CO2: 18 mmol/L — ABNORMAL LOW (ref 22–32)
CO2: 21 mmol/L — ABNORMAL LOW (ref 22–32)
CO2: 21 mmol/L — ABNORMAL LOW (ref 22–32)
CO2: 20 mmol/L — ABNORMAL LOW (ref 22–32)
CO2: 20 mmol/L — ABNORMAL LOW (ref 22–32)
Calcium: 8 mg/dL — ABNORMAL LOW (ref 8.9–10.3)
Calcium: 8 mg/dL — ABNORMAL LOW (ref 8.9–10.3)
Calcium: 8 mg/dL — ABNORMAL LOW (ref 8.9–10.3)
Calcium: 7.8 mg/dL — ABNORMAL LOW (ref 8.9–10.3)
Calcium: 7.7 mg/dL — ABNORMAL LOW (ref 8.9–10.3)
Chloride: 124 mmol/L — ABNORMAL HIGH (ref 98–111)
Chloride: 123 mmol/L — ABNORMAL HIGH (ref 98–111)
Chloride: 124 mmol/L — ABNORMAL HIGH (ref 98–111)
Chloride: 124 mmol/L — ABNORMAL HIGH (ref 98–111)
Chloride: 121 mmol/L — ABNORMAL HIGH (ref 98–111)
Creatinine, Ser: 0.81 mg/dL (ref 0.61–1.24)
Creatinine, Ser: 0.84 mg/dL (ref 0.61–1.24)
Creatinine, Ser: 0.84 mg/dL (ref 0.61–1.24)
Creatinine, Ser: 0.84 mg/dL (ref 0.61–1.24)
Creatinine, Ser: 0.79 mg/dL (ref 0.61–1.24)
GFR calc Af Amer: >60 mL/min (ref >60)
GFR calc Af Amer: >60 mL/min (ref >60)
GFR calc Af Amer: >60 mL/min (ref >60)
GFR calc Af Amer: >60 mL/min (ref >60)
GFR calc Af Amer: >60 mL/min (ref >60)
GFR calc non Af Amer: >60 mL/min (ref >60)
GFR calc non Af Amer: >60 mL/min (ref >60)
GFR calc non Af Amer: >60 mL/min (ref >60)
GFR calc non Af Amer: >60 mL/min (ref >60)
GFR calc non Af Amer: >60 mL/min (ref >60)
Glucose, Bld: 300 mg/dL — ABNORMAL HIGH (ref 70–99)
Glucose, Bld: 129 mg/dL — ABNORMAL HIGH (ref 70–99)
Glucose, Bld: 95 mg/dL (ref 70–99)
Glucose, Bld: 199 mg/dL — ABNORMAL HIGH (ref 70–99)
Glucose, Bld: 212 mg/dL — ABNORMAL HIGH (ref 70–99)
Potassium: 2.2 mmol/L — CL (ref 3.5–5.1)
Potassium: 2.5 mmol/L — CL (ref 3.5–5.1)
Potassium: 3.5 mmol/L (ref 3.5–5.1)
Potassium: 4 mmol/L (ref 3.5–5.1)
Potassium: 4.3 mmol/L (ref 3.5–5.1)
Sodium: 151 mmol/L — ABNORMAL HIGH (ref 135–145)
Sodium: 151 mmol/L — ABNORMAL HIGH (ref 135–145)
Sodium: 152 mmol/L — ABNORMAL HIGH (ref 135–145)
Sodium: 151 mmol/L — ABNORMAL HIGH (ref 135–145)
Sodium: 148 mmol/L — ABNORMAL HIGH (ref 135–145)

## 2019-08-15 LAB — CBC
HCT: 36.4 % — ABNORMAL LOW (ref 39.0–52.0)
Hemoglobin: 12.9 g/dL — ABNORMAL LOW (ref 13.0–17.0)
MCH: 31.2 pg (ref 26.0–34.0)
MCHC: 35.4 g/dL (ref 30.0–36.0)
MCV: 87.9 fL (ref 80.0–100.0)
Platelets: 69 10*3/uL — ABNORMAL LOW (ref 150–400)
RBC: 4.14 MIL/uL — ABNORMAL LOW (ref 4.22–5.81)
RDW: 13.2 % (ref 11.5–15.5)
WBC: 10.3 10*3/uL (ref 4.0–10.5)
nRBC: 0 % (ref 0.0–0.2)

## 2019-08-15 LAB — POCT I-STAT 7, (LYTES, BLD GAS, ICA,H+H)
Acid-base deficit: 6 mmol/L — ABNORMAL HIGH (ref 0.0–2.0)
Bicarbonate: 19.4 mmol/L — ABNORMAL LOW (ref 20.0–28.0)
Calcium, Ion: 1.28 mmol/L (ref 1.15–1.40)
HCT: 37 % — ABNORMAL LOW (ref 39.0–52.0)
Hemoglobin: 12.6 g/dL — ABNORMAL LOW (ref 13.0–17.0)
O2 Saturation: 97 %
Patient temperature: 98.6
Potassium: 2.5 mmol/L — CL (ref 3.5–5.1)
Sodium: 156 mmol/L — ABNORMAL HIGH (ref 135–145)
TCO2: 21 mmol/L — ABNORMAL LOW (ref 22–32)
pCO2 arterial: 36.8 mmHg (ref 32.0–48.0)
pH, Arterial: 7.33 — ABNORMAL LOW (ref 7.350–7.450)
pO2, Arterial: 98 mmHg (ref 83.0–108.0)

## 2019-08-15 LAB — URINE CULTURE: Culture: NO GROWTH

## 2019-08-15 LAB — GLUCOSE, CAPILLARY
Glucose-Capillary: 100 mg/dL — ABNORMAL HIGH (ref 70–99)
Glucose-Capillary: 100 mg/dL — ABNORMAL HIGH (ref 70–99)
Glucose-Capillary: 110 mg/dL — ABNORMAL HIGH (ref 70–99)
Glucose-Capillary: 111 mg/dL — ABNORMAL HIGH (ref 70–99)
Glucose-Capillary: 120 mg/dL — ABNORMAL HIGH (ref 70–99)
Glucose-Capillary: 140 mg/dL — ABNORMAL HIGH (ref 70–99)
Glucose-Capillary: 145 mg/dL — ABNORMAL HIGH (ref 70–99)
Glucose-Capillary: 151 mg/dL — ABNORMAL HIGH (ref 70–99)
Glucose-Capillary: 161 mg/dL — ABNORMAL HIGH (ref 70–99)
Glucose-Capillary: 206 mg/dL — ABNORMAL HIGH (ref 70–99)
Glucose-Capillary: 232 mg/dL — ABNORMAL HIGH (ref 70–99)
Glucose-Capillary: 283 mg/dL — ABNORMAL HIGH (ref 70–99)
Glucose-Capillary: 83 mg/dL (ref 70–99)
Glucose-Capillary: 84 mg/dL (ref 70–99)
Glucose-Capillary: >600 mg/dL (ref 70–99)

## 2019-08-15 LAB — LEGIONELLA PNEUMOPHILA SEROGP 1 UR AG: L. pneumophila Serogp 1 Ur Ag: NEGATIVE

## 2019-08-15 LAB — TRIGLYCERIDES: Triglycerides: 25 mg/dL (ref <150)

## 2019-08-15 LAB — PHOSPHORUS
Phosphorus: 1.2 mg/dL — ABNORMAL LOW (ref 2.5–4.6)
Phosphorus: 2.6 mg/dL (ref 2.5–4.6)

## 2019-08-15 LAB — BETA-HYDROXYBUTYRIC ACID: Beta-Hydroxybutyric Acid: 0.07 mmol/L (ref 0.05–0.27)

## 2019-08-15 MED ORDER — POTASSIUM PHOSPHATES 15 MMOLE/5ML IV SOLN
30.0000 mmol | Freq: Once | INTRAVENOUS | Status: AC
  Administered 2019-08-15: 30 mmol via INTRAVENOUS
  Filled 2019-08-15: qty 10

## 2019-08-15 MED ORDER — POTASSIUM CHLORIDE 10 MEQ/50ML IV SOLN
10.0000 meq | INTRAVENOUS | Status: DC
  Administered 2019-08-15 (×5): 10 meq via INTRAVENOUS
  Filled 2019-08-15 (×5): qty 50

## 2019-08-15 MED ORDER — FREE WATER: 200.0000 mL | Freq: Three times a day (TID) | Status: DC

## 2019-08-15 MED ORDER — SODIUM CHLORIDE 0.9% FLUSH
10.0000 mL | Freq: Two times a day (BID) | INTRAVENOUS | Status: DC
  Administered 2019-08-15 – 2019-08-21 (×14): 10 mL
  Administered 2019-08-22: 30 mL
  Administered 2019-08-22 – 2019-08-25 (×6): 10 mL
  Administered 2019-08-25: 30 mL
  Administered 2019-08-26 – 2019-09-02 (×11): 10 mL
  Administered 2019-09-02: 30 mL
  Administered 2019-09-03 – 2019-09-09 (×12): 10 mL

## 2019-08-15 MED ORDER — GABAPENTIN 250 MG/5ML PO SOLN
600.0000 mg | Freq: Three times a day (TID) | ORAL | Status: DC
  Administered 2019-08-15 – 2019-08-20 (×15): 600 mg
  Filled 2019-08-15 (×17): qty 12

## 2019-08-15 MED ORDER — FENTANYL CITRATE (PF) 100 MCG/2ML IJ SOLN
25.0000 ug | INTRAMUSCULAR | Status: DC | PRN
  Administered 2019-08-17 – 2019-08-18 (×3): 100 ug via INTRAVENOUS
  Administered 2019-08-18: 50 ug via INTRAVENOUS
  Filled 2019-08-15: qty 2

## 2019-08-15 MED ORDER — VITAL 1.5 CAL PO LIQD
1000.0000 mL | ORAL | Status: DC
  Administered 2019-08-15 – 2019-08-17 (×3): 1000 mL
  Filled 2019-08-15 (×4): qty 1000

## 2019-08-15 MED ORDER — INSULIN ASPART 100 UNIT/ML ~~LOC~~ SOLN
0.0000 [IU] | SUBCUTANEOUS | Status: DC
  Administered 2019-08-15: 3 [IU] via SUBCUTANEOUS
  Administered 2019-08-15: 2 [IU] via SUBCUTANEOUS
  Administered 2019-08-16: 3 [IU] via SUBCUTANEOUS
  Administered 2019-08-16 – 2019-08-17 (×2): 2 [IU] via SUBCUTANEOUS
  Administered 2019-08-17: 3 [IU] via SUBCUTANEOUS
  Administered 2019-08-17: 2 [IU] via SUBCUTANEOUS
  Administered 2019-08-18: 3 [IU] via SUBCUTANEOUS

## 2019-08-15 MED ORDER — FENTANYL CITRATE (PF) 100 MCG/2ML IJ SOLN
25.0000 ug | INTRAMUSCULAR | Status: DC | PRN
  Administered 2019-08-15: 25 ug via INTRAVENOUS
  Filled 2019-08-15: qty 2

## 2019-08-15 MED ORDER — HALOPERIDOL LACTATE 5 MG/ML IJ SOLN: 2.0000 mg | Freq: Four times a day (QID) | INTRAMUSCULAR | Status: DC | PRN

## 2019-08-15 MED ORDER — DOCUSATE SODIUM 50 MG/5ML PO LIQD: 100.0000 mg | Freq: Two times a day (BID) | ORAL | Status: DC

## 2019-08-15 MED ORDER — MIDAZOLAM HCL 2 MG/2ML IJ SOLN: 0.5000 mg | INTRAMUSCULAR | Status: DC | PRN

## 2019-08-15 MED ORDER — POTASSIUM CHLORIDE 10 MEQ/50ML IV SOLN
10.0000 meq | INTRAVENOUS | Status: AC
  Administered 2019-08-15 (×3): 10 meq via INTRAVENOUS
  Filled 2019-08-15: qty 50

## 2019-08-15 MED ORDER — ARIPIPRAZOLE 2 MG PO TABS
2.0000 mg | ORAL_TABLET | Freq: Every day | ORAL | Status: DC
  Administered 2019-08-15 – 2019-08-19 (×5): 2 mg
  Filled 2019-08-15 (×5): qty 1

## 2019-08-15 MED ORDER — POTASSIUM CHLORIDE 10 MEQ/50ML IV SOLN
10.0000 meq | INTRAVENOUS | Status: DC
  Filled 2019-08-15 (×2): qty 50

## 2019-08-15 MED ORDER — POLYETHYLENE GLYCOL 3350 17 G PO PACK
17.0000 g | PACK | Freq: Every day | ORAL | Status: DC
  Administered 2019-08-18: 17 g via ORAL
  Filled 2019-08-15: qty 1

## 2019-08-15 MED ORDER — NOREPINEPHRINE 4 MG/250ML-% IV SOLN
0.0000 ug/min | INTRAVENOUS | Status: DC
  Administered 2019-08-15 – 2019-08-16 (×2): 15 ug/min via INTRAVENOUS
  Administered 2019-08-16: 7 ug/min via INTRAVENOUS
  Administered 2019-08-16 – 2019-08-17 (×3): 15 ug/min via INTRAVENOUS
  Administered 2019-08-17: 16 ug/min via INTRAVENOUS
  Administered 2019-08-17: 13 ug/min via INTRAVENOUS
  Administered 2019-08-17 – 2019-08-18 (×2): 14 ug/min via INTRAVENOUS
  Filled 2019-08-15 (×9): qty 250

## 2019-08-15 MED ORDER — SODIUM CHLORIDE 0.9% FLUSH: 10.0000 mL | INTRAVENOUS | Status: DC | PRN

## 2019-08-15 MED ORDER — LIDOCAINE HCL URETHRAL/MUCOSAL 2 % EX GEL
1.0000 "application " | Freq: Once | CUTANEOUS | Status: DC
  Filled 2019-08-15 (×2): qty 11

## 2019-08-15 MED ORDER — VITAL HIGH PROTEIN PO LIQD: 1000.0000 mL | ORAL | Status: DC

## 2019-08-15 MED ORDER — MAGNESIUM SULFATE 2 GM/50ML IV SOLN
2.0000 g | Freq: Once | INTRAVENOUS | Status: AC
  Administered 2019-08-15: 2 g via INTRAVENOUS
  Filled 2019-08-15: qty 50

## 2019-08-15 MED ORDER — POTASSIUM CHLORIDE 20 MEQ/15ML (10%) PO SOLN
40.0000 meq | ORAL | Status: AC
  Administered 2019-08-15 – 2019-08-16 (×4): 40 meq
  Filled 2019-08-15 (×4): qty 30

## 2019-08-15 MED ORDER — FREE WATER
100.0000 mL | Freq: Three times a day (TID) | Status: DC
  Administered 2019-08-15 – 2019-08-24 (×27): 100 mL

## 2019-08-15 MED ORDER — ORAL CARE MOUTH RINSE
15.0000 mL | Freq: Two times a day (BID) | OROMUCOSAL | Status: DC
  Administered 2019-08-15 – 2019-08-17 (×3): 15 mL via OROMUCOSAL

## 2019-08-15 MED ORDER — POTASSIUM & SODIUM PHOSPHATES 280-160-250 MG PO PACK
1.0000 | PACK | Freq: Once | ORAL | Status: AC
  Administered 2019-08-15: 1
  Filled 2019-08-15: qty 1

## 2019-08-15 MED ORDER — PROSOURCE TF PO LIQD
45.0000 mL | Freq: Every day | ORAL | Status: DC
  Administered 2019-08-15 – 2019-08-24 (×10): 45 mL
  Filled 2019-08-15 (×10): qty 45

## 2019-08-15 MED ORDER — PROSOURCE TF PO LIQD: 45.0000 mL | Freq: Two times a day (BID) | ORAL | Status: DC

## 2019-08-15 MED ORDER — DEXTROSE 5 % IV SOLN: INTRAVENOUS | Status: DC

## 2019-08-15 MED ORDER — NOREPINEPHRINE 4 MG/250ML-% IV SOLN
INTRAVENOUS | Status: AC
  Administered 2019-08-15: 18 ug/min via INTRAVENOUS
  Filled 2019-08-15: qty 250

## 2019-08-15 NOTE — Progress Notes (Signed)
Assisted tele visit to patient with family member.  Thomas, Deniro Laymon Renee, RN   

## 2019-08-15 NOTE — Progress Notes (Signed)
Attempted I&O catheter, unsuccessful due to patient anatomy and phimosis.

## 2019-08-15 NOTE — Progress Notes (Signed)
Second attempt to I&O catheterize patient w/ another RN, second attempt unsuccessful. Pt bladder scanned and noted to have >53ml urine per bladder scanner. Laurey Arrow, NP made aware.

## 2019-08-15 NOTE — Progress Notes (Signed)
Assisted tele visit to patient with family member.  Thomas, Kayle Passarelli Renee, RN   

## 2019-08-15 NOTE — Procedures (Signed)
Extubation Procedure Note  Patient Details:   Name: Michael [name] DOB: 1955-09-07 MRN: [id number]   Airway Documentation:    Vent end date: 08/15/19 Vent end time: 1535   Evaluation  O2 sats: stable throughout Complications: No apparent complications Patient did tolerate procedure well. Bilateral Breath Sounds: Diminished, Rhonchi   Pt extubated to 4L [place] per MD order. Cuff leak was positive. No stridor noted. Vilinda Blanks 08/15/2019, 3:38 PM

## 2019-08-15 NOTE — Progress Notes (Signed)
Attempted to insert foley in pt. Pt foreskin very hard to retract making visualizing the urethra a challenge. Could not advance foley. Reapplied condom cath and checked residual urine =439. Made MD aware.

## 2019-08-15 NOTE — Progress Notes (Signed)
Per Laurey Arrow NP, coude cath team contacted to attempt coude cath on patient.

## 2019-08-15 NOTE — Care Plan (Signed)
Extubated to [place]. Now talking, sitting up in bed, much more alert.   PRN haldol and midazolam for agitation.  Julian Hy, DO 08/15/19 3:41 PM [facility]

## 2019-08-15 NOTE — Progress Notes (Signed)
CRITICAL VALUE ALERT  Critical Value:  K 2.2  Date & Time Notied:  08/15/19 0225  Provider Notified: On call PCCM  Orders Received/Actions taken: New orders for potassium replenishment given.

## 2019-08-15 NOTE — Progress Notes (Addendum)
CC: Altered mental status  Subjective: Patient is sedated on the vent, he did not react or wake up at all during my exam.  On exam his abdomen is soft, not distended, positive bowel sounds.  Objective: Vital signs in last 24 hours: Temp:  [90.1 F (32.3 C)-98.6 F (37 C)] 97.8 F (36.6 C) (07/23 0755) Pulse Rate:  [33-114] 96 (07/23 0822) Resp:  [14-36] 36 (07/23 0822) BP: (37-136)/(22-106) 97/69 (07/23 0700) SpO2:  [67 %-100 %] 100 % (07/23 0822) FiO2 (%):  [70 %-100 %] 80 % (07/23 0822) Weight:  [49.5 kg-56.7 kg] 51.5 kg (07/23 0454) Last BM Date:  (PTA) 4471 IV N.p.o. 2550 urine NG 5 cc No BM reported Afebrile, hypothermia >> 97.8_0  Mild tachycardia; improving hypotension on Norepinephrine pH 7.33 PCO2 36 PO2 98 bicarb 19 NA 156, potassium 2.5, chloride 123, CO2 21, glucose 129, creatinine 0.84, WBC 10.3, H/H 12.9/36.4; platelets 69,000 CXR this AM:  Endotracheal tube left IJ line, NG tube in stable position. Heart size stable. Bilateral pulmonary infiltrates again noted, improved from prior exam. No prominent pleural effusion. No pneumothorax.  Intake/Output from previous day: 07/22 0701 - 07/23 0700 In: 4471.1 [I.V.:1392.5; IV Piggyback:3078.6] Out: 2555 [Urine:2550; Emesis/NG output:5] Intake/Output this shift: No intake/output data recorded.  General appearance: Sedated and unresponsive on the vent GI: Soft, no tenderness on exam.  He is not distended, positive bowel sounds.  Lab Results:  Recent Labs    08/16/2019 1148 08/17/2019 1614 08/15/19 0456 08/15/19 0529  WBC 15.3*  --  10.3  --   HGB 13.3   < > 12.9* 12.6*  HCT 40.5   < > 36.4* 37.0*  PLT 120*  --  69*  --    < > = values in this interval not displayed.    BMET Recent Labs    08/15/19 0029 08/15/19 0029 08/15/19 0456 08/15/19 0529  NA 151*   < > 151* 156*  K 2.2*   < > 2.5* 2.5*  CL 124*  --  123*  --   CO2 18*  --  21*  --   GLUCOSE 300*  --  129*  --   BUN <5*  --  <5*  --    CREATININE 0.81  --  0.84  --   CALCIUM 8.0*  --  8.0*  --    < > = values in this interval not displayed.   PT/INR Recent Labs    08/07/2019 0830 07/25/2019 1148  LABPROT 15.7* 16.0*  INR 1.3* 1.3*    Recent Labs  Lab 08/13/2019 0830 08/05/2019 1148  AST 46* 46*  ALT 143* 128*  ALKPHOS 377* 308*  BILITOT 0.8 0.6  PROT 5.3* 4.7*  ALBUMIN 2.9* 2.5*     Lipase     Component Value Date/Time   LIPASE 19 08/04/2019 1148     Medications:  chlorhexidine gluconate (MEDLINE KIT)  15 mL Mouth Rinse BID   Chlorhexidine Gluconate Cloth  6 each Topical Q0600   docusate  100 mg Per Tube BID   mouth rinse  15 mL Mouth Rinse 10 times per day   pantoprazole (PROTONIX) IV  40 mg Intravenous Daily   polyethylene glycol  17 g Per Tube Daily   sodium chloride flush  10-40 mL Intracatheter Q12H    sodium chloride 100 mL/hr at 08/15/19 0100   ceFEPime (MAXIPIME) IV Stopped (08/18/2019 2323)   dextrose 5 % and 0.45% NaCl 75 mL/hr at 08/15/19 0400   insulin 1  Units/hr (08/15/19 0800)   levETIRAcetam 500 mg (08/15/19 0805)   norepinephrine (LEVOPHED) Adult infusion 16 mcg/min (08/15/19 0929)   potassium chloride 10 mEq (08/15/19 0812)   propofol (DIPRIVAN) infusion     vancomycin Stopped (08/15/19 0056)    Assessment/Plan Septic shock Diabetic ketoacidosis  - Lactate >11.0 >>7.7 Coma on presentation Possible seizure episodes Schizophrenia Acute hypoxemic respiratory failure Bilateral pneumonia -secondary to aspiration Severe hypokalemia  - <2.0 > 2.5 Severe dehydration Thrombocytopenia Elevated LFTs   - alk phos 377 > 308  - AST  46 > 46  - ALT 143 > 128  - T bil 0.8>0.6   Abdominal tenderness  FEN: IV fluids/n.p.o. ID: Cefepime/vancomycin 7/22 >> day 2 DVT: SCDs Follow-up: TBD   Plan: Currently there is no acute findings on the abdominal exam.  He is on norepinephrine, insulin, IV potassium, and IV fluids.  Once his metabolic issues have been corrected, if he seems to  have any abdominal tenderness, would obtain CT scan then.  Call us back if any significant findings.   LOS: 1 day    JENNINGS,WILLARD 08/15/2019 Please see Georgeanna Harrison K. Georgette Dover, MD, [facility]  General/ Trauma Surgery   08/15/2019 10:47 AM

## 2019-08-15 NOTE — Progress Notes (Signed)
eLink Physician-Brief Progress Note Patient Name: Michael [name] DOB: [birth year] MRN: [id number]   Date of Service  08/15/2019  HPI/Events of Note  Patient had > 700 ml of urine in the bladder on bladder catheterization  eICU Interventions  Foley catheter ordered.        [name] Ifrah Vest 08/15/2019, 3:35 AM

## 2019-08-15 NOTE — Progress Notes (Signed)
CRITICAL VALUE ALERT  Critical Value:  K 2.5  Date & Time Notied:  08/15/19 0630  Provider Notified: On call PCCM  Orders Received/Actions taken: No new orders received continue giving potassium per [facility] and repeat BMP Q4

## 2019-08-15 NOTE — Progress Notes (Signed)
Assisted tele visit to patient with daughter.  [name], Michael Vonbehren Renee, RN   

## 2019-08-15 NOTE — Progress Notes (Addendum)
NAME:  Michael [name], MRN:  [id number], DOB:  08-31-1955, LOS: 1 ADMISSION DATE:  07/29/2019, CONSULTATION DATE: 08/17/2019 REFERRING MD: EDP, CHIEF COMPLAINT: Altered mental status in the setting of hyperglycemia suspected pneumonia complicated by poor nutritional status and failure to thrive.  Brief History   64 year old with history of noncompliance insulin-dependent diabetes mellitus who presents 08/20/2019 with seizure activity altered mental status glucose failure 14 a potassium of 2.0.  Pulmonary critical care asked to admit  Past Medical History   Past Medical History:  Diagnosis Date  . Anxiety   . Arthritis    "joints; shoulders; feet" (06/08/2016)  . Bipolar disorder ([place])   . Daily headache    "7:30 - 8:00 q night" (06/08/2016)  . Diabetic peripheral neuropathy ([place])   . GERD (gastroesophageal reflux disease)   . History of hepatitis B virus infection conferring immunity 03/22/2018  . Pancreatitis   . Schizophrenia ([place])   . Seizures ([place]) 01/2016 X 2  . Type II diabetes mellitus ([place])      Significant Hospital Events   Presented to the emergency department 08/22/2019 altered mental status glucose greater than 1400 and noted to have grand mal seizure prior to admission.  Intubated for worsening hypoxia and respiratory failure, developed progressive hypovolemic shock required significant volume resuscitation in central venous access with pressors 7/23: Off pressors.  Chest x-ray improved.  Weaning ventilator support, glycemic control improved, profoundly hypernatremic with multiple metabolic derangements still  Consults:  08/02/2019 neurology Procedures:    Significant Diagnostic Tests:    Micro Data:  08/15/2019 blood culture x2>> 07/25/2019 urine culture>> 08/03/2019 Legionella>> 07/28/2019 COVID-19 testing>> pending at the time of this dictation  Antimicrobials:  07/31/2019 vancomycin>> 7/23 07/28/2019 cefepime>>  Interim history/subjective:  Minimally responsive  but no focal motor deficits now off pressors  Objective   Blood pressure 96/69, pulse 92, temperature 97.8 F (36.6 C), temperature source Axillary, resp. rate (Abnormal) 28, height 5\' 4"  (1.626 m), weight 51.5 kg, SpO2 100 %.    Vent Mode: PRVC FiO2 (%):  [60 %-100 %] 60 % Set Rate:  [24 bmp-28 bmp] 28 bmp Vt Set:  [450 mL-500 mL] 500 mL PEEP:  [5 cmH20] 5 cmH20 Plateau Pressure:  [14 cmH20-26 cmH20] 17 cmH20   Intake/Output Summary (Last 24 hours) at 08/15/2019 1044 Last data filed at 08/15/2019 0400 Gross per 24 hour  Intake 4471.08 ml  Output 2555 ml  Net 1916.08 ml   Filed Weights   08/16/2019 1000 08/08/2019 1400 08/15/19 0454  Weight: 56.7 kg 49.5 kg 51.5 kg    Examination: General: This is a frail 64 year old black male he is currently on full ventilatory support, mildly restless but able to be controlled with as needed medication  HEENT: Normocephalic atraumatic no jugular venous distention is appreciated Pulmonary: Coarse scattered rhonchi equal chest rise no accessory use currently Cardiac: Regular rate and rhythm without murmur rub or gallop Abdomen: Soft, not tender, bowel sounds present.  Abdomen benign GU: Clear yellow Neuro: Agitated but moving all extremities Extremities: Warm and dry  Resolved Hospital Problem list   Acute anion gap metabolic acidosis: In the setting of lactic acidosis: Anion gap now closed,Acid-base improved .  Assessment & Plan:  Nonketotic hyperosmolar hyperglycemia with profound hypovolemic shock -His beta hydroxybutyric acid was negative -Shock state now resolved, weaned off pressors -Glycemic control improved Plan Transition to sliding scale insulin Keep euvolemic  Acute hypoxic respiratory failure in the setting of multifocal pneumonia Suspect aspiration Portable chest x-ray personally reviewed:This  shows the endotracheal tube in satisfactory position, there he has bilateral patchy airspace disease comparing this to yesterday's  film aeration has greatly improved Weaning PEEP and FiO2 -Encephalopathy at this point will be barrier to extubation Plan Continue full ventilator support KVO IV fluids VAP bundle PAD protocol Keep him euvolemic, as chest x-ray is actually improved some, no indication for diuresis today He is currently on antibiotic day #2 with vancomycin and cefepime His MRSA PCR is negative, I am going to discontinue the vancomycin, we will continue cefepime and plan for 5-day course Will get a.m. chest x-ray Assess daily for readiness for weaning   Fluid and electrolyte imbalance:Severe hypernatremia, hypokalemia, and post resuscitation hyperchloremia Plan Aggressively replace potassium using both via tube and IV route Change maintenance IV fluids to D5 water, his current water deficit is 2.4 L, we will replace 1.2 L today We will continue close monitoring of electrolyte status  Reported seizure activity prior to admission Plan Continue to monitor As needed bennzos Cont keppra   Acute metabolic encephalopathy in the setting of profound metabolic acidosis, hypoxia, shock, likely further exacerbated by underlying schizophrenia and what may have been a postictal state post seizure -He remains encephalopathic but without localized deficits Plan Continue PAD protocol Holding cymbalta as can't be given VT, but I think we can resume abilify and Neurontin via tube  Chronic pancreatitis Plan Resume his Creon Repeat amylase and lipase  Ileus, versus SBO.  Seen by surgery.Abdomen fairly benign. Spoke w/ surgical team  Plan We will start trickle feed If develops abdominal discomfort or has trouble we could CT abdomen  Generalized failure to thrive in a 64 year old extremely cachectic male Plan Social work consulted  Best practice:  Diet: NPO-->trickle  Pain/Anxiety/Delirium protocol (if indicated): Currently altered mental status history of substance abuse monitor VAP protocol (if indicated):  ordered DVT prophylaxis: Sequential hose hold heparin until altered mental status is further evaluate the possible CT of the head GI prophylaxis: PPI Glucose control: IV insulin-->SSI Mobility: Bedrest Code Status: Full Family Communication: No family at bedside Disposition: cont ICU care   Labs   CBC: Recent Labs  Lab 07/25/2019 0830 07/24/2019 0840 08/19/2019 1127 07/29/2019 1148 08/17/2019 1614 08/15/19 0456 08/15/19 0529  WBC 22.3*  --   --  15.3*  --  10.3  --   NEUTROABS 20.3*  --   --   --   --   --   --   HGB 14.2   < > 14.6 13.3 14.6 12.9* 12.6*  HCT 44.9   < > 43.0 40.5 43.0 36.4* 37.0*  MCV 100.2*  --   --  96.2  --  87.9  --   PLT 156  --   --  120*  --  69*  --    < > = values in this interval not displayed.    Basic Metabolic Panel: Recent Labs  Lab 07/30/2019 0830 08/12/2019 0840 07/27/2019 1148 08/15/2019 1148 07/29/2019 1451 08/22/2019 1614 08/15/19 0029 08/15/19 0456 08/15/19 0529  NA 138   < > 132*   < > 147* 153* 151* 151* 156*  K <2.0*   < > >7.5*   < > <2.0* <2.0* 2.2* 2.5* 2.5*  CL 102  --  110  --  118*  --  124* 123*  --   CO2 13*  --  16*  --  18*  --  18* 21*  --   GLUCOSE 1,485*  --  1,073*  --  866*  --  300* 129*  --   BUN <5*  --  <5*  --  <5*  --  <5* <5*  --   CREATININE 1.28*  --  1.15  --  0.99  --  0.81 0.84  --   CALCIUM 9.0  --  7.8*  --  7.8*  --  8.0* 8.0*  --   MG 1.8  --  1.4*  --   --   --   --   --   --   PHOS  --   --  1.2*  --   --   --   --   --   --    < > = values in this interval not displayed.   GFR: Estimated Creatinine Clearance: 67.3 mL/min (by C-G formula based on SCr of 0.84 mg/dL). Recent Labs  Lab 08/01/2019 0830 08/02/2019 1148 08/19/2019 1451 08/15/19 0456  PROCALCITON  --  0.99  --   --   WBC 22.3* 15.3*  --  10.3  LATICACIDVEN >11.0* 10.7* 7.7*  --     Liver Function Tests: Recent Labs  Lab 08/21/2019 0830 08/04/2019 1148  AST 46* 46*  ALT 143* 128*  ALKPHOS 377* 308*  BILITOT 0.8 0.6  PROT 5.3* 4.7*  ALBUMIN  2.9* 2.5*   Recent Labs  Lab 08/22/2019 1148  LIPASE 19  AMYLASE 383*   No results for input(s): AMMONIA in the last 168 hours.  ABG    Component Value Date/Time   PHART 7.330 (L) 08/15/2019 0529   PCO2ART 36.8 08/15/2019 0529   PO2ART 98 08/15/2019 0529   HCO3 19.4 (L) 08/15/2019 0529   TCO2 21 (L) 08/15/2019 0529   ACIDBASEDEF 6.0 (H) 08/15/2019 0529   O2SAT 97.0 08/15/2019 0529     Coagulation Profile: Recent Labs  Lab 08/23/2019 0830 07/28/2019 1148  INR 1.3* 1.3*    Cardiac Enzymes: No results for input(s): CKTOTAL, CKMB, CKMBINDEX, TROPONINI in the last 168 hours.  HbA1C: Hgb A1c MFr Bld  Date/Time Value Ref Range Status  03/11/2019 08:00 PM 14.8 (H) 4.8 - 5.6 % Final    Comment:    (NOTE) Pre diabetes:          5.7%-6.4% Diabetes:              >6.4% Glycemic control for   <7.0% adults with diabetes   03/27/2017 03:17 AM 11.1 (H) 4.8 - 5.6 % Final    Comment:    (NOTE) Pre diabetes:          5.7%-6.4% Diabetes:              >6.4% Glycemic control for   <7.0% adults with diabetes     CBG: Recent Labs  Lab 08/15/19 0553 08/15/19 0657 08/15/19 0756 08/15/19 0926 08/15/19 1036  GLUCAP 110* 120* 151* 100* 100*   Critical care time  45 minutes  Erick Colace ACNP-BC [place] Pager # [phone] OR # [phone] if no answer

## 2019-08-16 ENCOUNTER — Inpatient Hospital Stay (HOSPITAL_COMMUNITY): Payer: Medicare HMO

## 2019-08-16 DIAGNOSIS — R579 Shock, unspecified: Secondary | ICD-10-CM

## 2019-08-16 DIAGNOSIS — R4182 Altered mental status, unspecified: Secondary | ICD-10-CM

## 2019-08-16 LAB — BASIC METABOLIC PANEL
Anion gap: 5 (ref 5–15)
Anion gap: 6 (ref 5–15)
Anion gap: 6 (ref 5–15)
Anion gap: 6 (ref 5–15)
BUN: 6 mg/dL — ABNORMAL LOW (ref 8–23)
BUN: 8 mg/dL (ref 8–23)
BUN: 10 mg/dL (ref 8–23)
BUN: 11 mg/dL (ref 8–23)
CO2: 17 mmol/L — ABNORMAL LOW (ref 22–32)
CO2: 19 mmol/L — ABNORMAL LOW (ref 22–32)
CO2: 19 mmol/L — ABNORMAL LOW (ref 22–32)
CO2: 19 mmol/L — ABNORMAL LOW (ref 22–32)
Calcium: 6.5 mg/dL — ABNORMAL LOW (ref 8.9–10.3)
Calcium: 7.7 mg/dL — ABNORMAL LOW (ref 8.9–10.3)
Calcium: 7.4 mg/dL — ABNORMAL LOW (ref 8.9–10.3)
Calcium: 7.4 mg/dL — ABNORMAL LOW (ref 8.9–10.3)
Chloride: 128 mmol/L — ABNORMAL HIGH (ref 98–111)
Chloride: 123 mmol/L — ABNORMAL HIGH (ref 98–111)
Chloride: 122 mmol/L — ABNORMAL HIGH (ref 98–111)
Chloride: 121 mmol/L — ABNORMAL HIGH (ref 98–111)
Creatinine, Ser: 0.6 mg/dL — ABNORMAL LOW (ref 0.61–1.24)
Creatinine, Ser: 0.66 mg/dL (ref 0.61–1.24)
Creatinine, Ser: 0.55 mg/dL — ABNORMAL LOW (ref 0.61–1.24)
Creatinine, Ser: 0.44 mg/dL — ABNORMAL LOW (ref 0.61–1.24)
GFR calc Af Amer: >60 mL/min (ref >60)
GFR calc Af Amer: >60 mL/min (ref >60)
GFR calc Af Amer: >60 mL/min (ref >60)
GFR calc Af Amer: >60 mL/min (ref >60)
GFR calc non Af Amer: >60 mL/min (ref >60)
GFR calc non Af Amer: >60 mL/min (ref >60)
GFR calc non Af Amer: >60 mL/min (ref >60)
GFR calc non Af Amer: >60 mL/min (ref >60)
Glucose, Bld: 123 mg/dL — ABNORMAL HIGH (ref 70–99)
Glucose, Bld: 188 mg/dL — ABNORMAL HIGH (ref 70–99)
Glucose, Bld: 135 mg/dL — ABNORMAL HIGH (ref 70–99)
Glucose, Bld: 153 mg/dL — ABNORMAL HIGH (ref 70–99)
Potassium: 3.8 mmol/L (ref 3.5–5.1)
Potassium: 5 mmol/L (ref 3.5–5.1)
Potassium: 4.4 mmol/L (ref 3.5–5.1)
Potassium: 4 mmol/L (ref 3.5–5.1)
Sodium: 150 mmol/L — ABNORMAL HIGH (ref 135–145)
Sodium: 148 mmol/L — ABNORMAL HIGH (ref 135–145)
Sodium: 147 mmol/L — ABNORMAL HIGH (ref 135–145)
Sodium: 146 mmol/L — ABNORMAL HIGH (ref 135–145)

## 2019-08-16 LAB — GLUCOSE, CAPILLARY
Glucose-Capillary: 106 mg/dL — ABNORMAL HIGH (ref 70–99)
Glucose-Capillary: 110 mg/dL — ABNORMAL HIGH (ref 70–99)
Glucose-Capillary: 111 mg/dL — ABNORMAL HIGH (ref 70–99)
Glucose-Capillary: 127 mg/dL — ABNORMAL HIGH (ref 70–99)
Glucose-Capillary: 158 mg/dL — ABNORMAL HIGH (ref 70–99)
Glucose-Capillary: 80 mg/dL (ref 70–99)
Glucose-Capillary: 86 mg/dL (ref 70–99)

## 2019-08-16 LAB — PROTIME-INR
INR: 1.4 — ABNORMAL HIGH (ref 0.8–1.2)
Prothrombin Time: 16.9 seconds — ABNORMAL HIGH (ref 11.4–15.2)

## 2019-08-16 LAB — PHOSPHORUS
Phosphorus: 1.9 mg/dL — ABNORMAL LOW (ref 2.5–4.6)
Phosphorus: 1.8 mg/dL — ABNORMAL LOW (ref 2.5–4.6)

## 2019-08-16 LAB — MAGNESIUM
Magnesium: 1.6 mg/dL — ABNORMAL LOW (ref 1.7–2.4)
Magnesium: 1.5 mg/dL — ABNORMAL LOW (ref 1.7–2.4)

## 2019-08-16 LAB — D-DIMER, QUANTITATIVE: D-Dimer, Quant: 8.58 ug/mL-FEU — ABNORMAL HIGH (ref 0.00–0.50)

## 2019-08-16 LAB — FIBRINOGEN: Fibrinogen: 417 mg/dL (ref 210–475)

## 2019-08-16 MED ORDER — WHITE PETROLATUM EX OINT
TOPICAL_OINTMENT | CUTANEOUS | Status: AC
  Administered 2019-08-16: 0.2
  Filled 2019-08-16: qty 28.35

## 2019-08-16 MED ORDER — DULOXETINE HCL 30 MG PO CPEP
30.0000 mg | ORAL_CAPSULE | Freq: Every day | ORAL | Status: DC
  Administered 2019-08-17: 30 mg via ORAL
  Filled 2019-08-16 (×2): qty 1

## 2019-08-16 MED ORDER — CHLORHEXIDINE GLUCONATE CLOTH 2 % EX PADS
6.0000 | MEDICATED_PAD | Freq: Every day | CUTANEOUS | Status: DC
  Administered 2019-08-16 – 2019-09-09 (×27): 6 via TOPICAL

## 2019-08-16 MED ORDER — MAGNESIUM SULFATE 2 GM/50ML IV SOLN
2.0000 g | Freq: Once | INTRAVENOUS | Status: AC
  Administered 2019-08-16: 2 g via INTRAVENOUS
  Filled 2019-08-16: qty 50

## 2019-08-16 MED ORDER — SODIUM PHOSPHATES 45 MMOLE/15ML IV SOLN
30.0000 mmol | Freq: Once | INTRAVENOUS | Status: AC
  Administered 2019-08-16: 30 mmol via INTRAVENOUS
  Filled 2019-08-16: qty 10

## 2019-08-16 NOTE — Plan of Care (Signed)

## 2019-08-16 NOTE — Progress Notes (Signed)
Attempted to place coude cath, unable to retract foreskin or visualize meatus, white discharge noted. Patient's nurse at bedside, aware of unsuccessful attempt. Patrici Ranks A

## 2019-08-16 NOTE — Progress Notes (Signed)
eLink Physician-Brief Progress Note Patient Name: Michael [name] DOB: 12-30-1955 MRN: [id number]   Date of Service  08/16/2019  HPI/Events of Note  Patient with obstructive uropathy, multiple attempts at coude catheterization unsuccessful despite > 600 ml urine retained in the bladder.  eICU Interventions  Urology consult, I spoke with urologist Dr. Berton Mount.        [name] Kaytlin Burklow 08/16/2019, 3:30 AM

## 2019-08-16 NOTE — Progress Notes (Signed)
Pt still unable to void. RN called Certified Coude RN to insert [place] cath. RN attempted and was unsuccessful. Elink on call notified.

## 2019-08-16 NOTE — Progress Notes (Addendum)
NAME:  Michael [name], MRN:  [id number], DOB:  [birth year], LOS: 2 ADMISSION DATE:  08/04/2019, CONSULTATION DATE: 08/13/2019 REFERRING MD: EDP, CHIEF COMPLAINT: Altered mental status in the setting of hyperglycemia suspected pneumonia complicated by poor nutritional status and failure to thrive.  Brief History   64 year old with history of noncompliance insulin-dependent diabetes mellitus who presents 08/08/2019 with seizure activity altered mental status glucose failure 14 a potassium of 2.0.  Pulmonary critical care asked to admit  Past Medical History   Past Medical History:  Diagnosis Date  . Anxiety   . Arthritis    "joints; shoulders; feet" (06/08/2016)  . Bipolar disorder ([place])   . Daily headache    "7:30 - 8:00 q night" (06/08/2016)  . Diabetic peripheral neuropathy ([place])   . GERD (gastroesophageal reflux disease)   . History of hepatitis B virus infection conferring immunity 03/22/2018  . Pancreatitis   . Schizophrenia ([place])   . Seizures ([place]) 01/2016 X 2  . Type II diabetes mellitus ([place])      Significant Hospital Events   Presented to the emergency department 08/11/2019 altered mental status glucose greater than 1400 and noted to have grand mal seizure prior to admission.  Intubated for worsening hypoxia and respiratory failure, developed progressive hypovolemic shock required significant volume resuscitation in central venous access with pressors 7/23: Off pressors.  Chest x-ray improved.  Weaning ventilator support, glycemic control improved, profoundly hypernatremic with multiple metabolic derangements.    Consults:  08/22/2019 neurology Procedures:    Significant Diagnostic Tests:    Micro Data:  08/13/2019 blood culture x2>> 08/09/2019 urine culture>> 08/18/2019 Legionella>> 07/30/2019 COVID-19 testing>> pending at the time of this dictation  Antimicrobials:  08/03/2019 vancomycin>> 7/23 07/26/2019 cefepime>>  Interim history/subjective:  Minimally responsive but  no focal motor deficits now off pressors  Objective   Blood pressure 124/82, pulse 86, temperature 98.3 F (36.8 C), temperature source Oral, resp. rate (!) 33, height 5\' 4"  (1.626 m), weight 54.5 kg, SpO2 94 %.    Vent Mode: PRVC FiO2 (%):  [40 %] 40 % Set Rate:  [28 bmp] 28 bmp Vt Set:  [500 mL] 500 mL PEEP:  [5 cmH20] 5 cmH20   Intake/Output Summary (Last 24 hours) at 08/16/2019 1052 Last data filed at 08/16/2019 1004 Gross per 24 hour  Intake 4135.48 ml  Output 450 ml  Net 3685.48 ml   Filed Weights   07/31/2019 1400 08/15/19 0454 08/16/19 0500  Weight: 49.5 kg 51.5 kg 54.5 kg    Examination: General: Awake, alert, not following commands, he is making eye contact and tracking.   HEENT: Normocephalic atraumatic no jugular venous distention is appreciated Pulmonary: Coarse scattered rhonchi equal chest rise no accessory use currently Cardiac: Regular rate and rhythm without murmur rub or gallop Abdomen: Soft, not tender, bowel sounds present.  Abdomen benign GU: Clear yellow Neuro: awake and alert, not responding verbally but eye contact and tracking.  Extremities: Warm and dry  Resolved Hospital Problem list   Acute anion gap metabolic acidosis: In the setting of lactic acidosis: Anion gap now closed,Acid-base improved .  Assessment & Plan:  Nonketotic hyperosmolar hyperglycemia with profound hypovolemic shock -His beta hydroxybutyric acid was negative -Glycemic control improved Plan Cont  sliding scale insulin Keep euvolemic  Shock: remains hypotensive today.  Cont to wean levophed.   Acute hypoxic respiratory failure in the setting of multifocal pneumonia Suspect aspiration Extubated yesterday.  Tolerating [place].  Remains tachypniec, monitor in ICU for now.  Plan Cont  cefepime, day 3 Holding diuresis today.    Fluid and electrolyte imbalance:Severe hypernatremia, hypokalemia, and post resuscitation hyperchloremia Plan K improved.  Cont D 5 water IVF. NA  improving.   We will continue close monitoring of electrolyte status  Reported seizure activity prior to admission Plan Continue to monitor As needed bennzos Cont keppra   Acute metabolic encephalopathy in the setting of profound metabolic acidosis, hypoxia, shock, likely further exacerbated by underlying schizophrenia and what may have been a postictal state post seizure -He remains encephalopathic but without localized deficits Delirium.   Plan Continue PAD protocol  abilify and Neurontin when taking Po.  Also add back cymbalta  Chronic pancreatitis Plan Resume his Creon Repeat amylase and lipase  Ileus, versus SBO.  Seen by surgery. Abdomen fairly benign. Spoke w/ surgical team  Plan Was on tube feeds while intubated.  Swallow eval when able.    Generalized failure to thrive in a 64 year old extremely cachectic male Plan Social work consulted  Best practice:  Diet: NPO-->trickle  Pain/Anxiety/Delirium protocol (if indicated): Currently altered mental status history of substance abuse monitor VAP protocol (if indicated): ordered DVT prophylaxis: Sequential hose hold heparin until altered mental status is further evaluate the possible CT of the head GI prophylaxis: PPI Glucose control: IV insulin-->SSI Mobility: Bedrest Code Status: Full Family Communication: No family at bedside Disposition: cont ICU care   Labs   CBC: Recent Labs  Lab 07/24/2019 0830 08/23/2019 0840 08/10/2019 1127 08/08/2019 1148 08/13/2019 1614 08/15/19 0456 08/15/19 0529  WBC 22.3*  --   --  15.3*  --  10.3  --   NEUTROABS 20.3*  --   --   --   --   --   --   HGB 14.2   < > 14.6 13.3 14.6 12.9* 12.6*  HCT 44.9   < > 43.0 40.5 43.0 36.4* 37.0*  MCV 100.2*  --   --  96.2  --  87.9  --   PLT 156  --   --  120*  --  69*  --    < > = values in this interval not displayed.    Basic Metabolic Panel: Recent Labs  Lab 08/15/2019 0830 08/06/2019 0840 08/06/2019 1148 08/06/2019 1451 08/15/19 1006  08/15/19 1201 08/15/19 1722 08/15/19 1900 08/16/19 0000 08/16/19 0505  NA 138   < > 132*   < > 152*  --  151* 148* 150* 148*  K <2.0*   < > >7.5*   < > 3.5  --  4.0 4.3 3.8 5.0  CL 102   < > 110   < > 124*  --  124* 121* 128* 123*  CO2 13*   < > 16*   < > 21*  --  20* 20* 17* 19*  GLUCOSE 1,485*   < > 1,073*   < > 95  --  199* 212* 123* 188*  BUN <5*   < > <5*   < > <5*  --  6* 6* 6* 8  CREATININE 1.28*   < > 1.15   < > 0.84  --  0.84 0.79 0.60* 0.66  CALCIUM 9.0   < > 7.8*   < > 8.0*  --  7.8* 7.7* 6.5* 7.7*  MG 1.8  --  1.4*  --   --  1.2* 1.8  --   --  1.6*  PHOS  --   --  1.2*  --   --  1.2* 2.6  --   --  1.9*   < > = values in this interval not displayed.   GFR: Estimated Creatinine Clearance: 74.7 mL/min (by C-G formula based on SCr of 0.66 mg/dL). Recent Labs  Lab 08/02/2019 0830 08/19/2019 1148 08/13/2019 1451 08/15/19 0456  PROCALCITON  --  0.99  --   --   WBC 22.3* 15.3*  --  10.3  LATICACIDVEN >11.0* 10.7* 7.7*  --     Liver Function Tests: Recent Labs  Lab 07/27/2019 0830 08/13/2019 1148  AST 46* 46*  ALT 143* 128*  ALKPHOS 377* 308*  BILITOT 0.8 0.6  PROT 5.3* 4.7*  ALBUMIN 2.9* 2.5*   Recent Labs  Lab 08/13/2019 1148  LIPASE 19  AMYLASE 383*   No results for input(s): AMMONIA in the last 168 hours.  ABG    Component Value Date/Time   PHART 7.330 (L) 08/15/2019 0529   PCO2ART 36.8 08/15/2019 0529   PO2ART 98 08/15/2019 0529   HCO3 19.4 (L) 08/15/2019 0529   TCO2 21 (L) 08/15/2019 0529   ACIDBASEDEF 6.0 (H) 08/15/2019 0529   O2SAT 97.0 08/15/2019 0529     Coagulation Profile: Recent Labs  Lab 08/04/2019 0830 08/22/2019 1148  INR 1.3* 1.3*    Cardiac Enzymes: No results for input(s): CKTOTAL, CKMB, CKMBINDEX, TROPONINI in the last 168 hours.  HbA1C: Hgb A1c MFr Bld  Date/Time Value Ref Range Status  03/11/2019 08:00 PM 14.8 (H) 4.8 - 5.6 % Final    Comment:    (NOTE) Pre diabetes:          5.7%-6.4% Diabetes:              >6.4% Glycemic  control for   <7.0% adults with diabetes   03/27/2017 03:17 AM 11.1 (H) 4.8 - 5.6 % Final    Comment:    (NOTE) Pre diabetes:          5.7%-6.4% Diabetes:              >6.4% Glycemic control for   <7.0% adults with diabetes     CBG: Recent Labs  Lab 08/15/19 1603 08/15/19 1953 08/15/19 2348 08/16/19 0325 08/16/19 0738  GLUCAP 84 161* 83 80 127*   Critical care time  45 minutes  Collier Bullock, MD

## 2019-08-16 NOTE — Progress Notes (Signed)
[facility] Progress Note Patient Name: Michael [name] DOB: 1955/02/05 MRN: [id number]   Date of Service  08/16/2019  HPI/Events of Note  Hypophosphatemia - PO4--- = 1.8 and Creatinine = 1.44. Hypomagnesemia - Mg++ = 1.5.   eICU Interventions  Plan: 1. Replace Mg++ and PO4---.     Intervention Category Major Interventions: Electrolyte abnormality - evaluation and management  Maryjean Corpening Cornelia Copa 08/16/2019, 9:48 PM

## 2019-08-16 NOTE — Consult Note (Signed)
Urology Consult   Physician requesting consult: Noemi Chapel, DO  Reason for consult: Phimosis, urinary retention  History of Present Illness: FIELD STANISZEWSKI is a 64 y.o. male admitted with altered mental status for whom urology is consulted for difficult foley placement in the setting of phimosis and urinary retention.  Patient is acutely delirious so history is difficult to attain. Patient was extubated yesterday afternoon, was able to void once spontaneously but has not voided since. Bladder scan >600. Multiple catheter attempts were made by nursing but without success.  Past Medical History:  Diagnosis Date   Anxiety    Arthritis    "joints; shoulders; feet" (06/08/2016)   Bipolar disorder ([facility])    Daily headache    "7:30 - 8:00 q night" (06/08/2016)   Diabetic peripheral neuropathy ([facility])    GERD (gastroesophageal reflux disease)    History of hepatitis B virus infection conferring immunity 03/22/2018   Pancreatitis    Schizophrenia ([place])    Seizures ([place]) 01/2016 X 2   Type II diabetes mellitus ([place])     Past Surgical History:  Procedure Laterality Date   BALLOON DILATION  03/22/2011   Procedure: BALLOON DILATION;  Surgeon: Gatha Mayer, MD;  Location: [facility];  Service: Endoscopy;  Laterality: N/A;   COLONOSCOPY N/A 05/22/2012   Procedure: COLONOSCOPY;  Surgeon: Gatha Mayer, MD;  Location: [facility];  Service: Endoscopy;  Laterality: N/A;   COLONOSCOPY W/ BIOPSIES AND POLYPECTOMY  09/12/11   ESOPHAGOGASTRODUODENOSCOPY  03/22/2011   Procedure: ESOPHAGOGASTRODUODENOSCOPY (EGD);  Surgeon: Gatha Mayer, MD;  Location: [facility];  Service: Endoscopy;  Laterality: N/A;  egd with balloon    EUS  04/20/2011   Procedure: UPPER ENDOSCOPIC ULTRASOUND (EUS) LINEAR;  Surgeon: Milus Banister, MD;  Location: [facility];  Service: Endoscopy;  Laterality: N/A;   KNEE ARTHROSCOPY Left     Current Hospital Medications:  Home Meds:  No current  facility-administered medications on file prior to encounter.   Current Outpatient Medications on File Prior to Encounter  Medication Sig Dispense Refill   albuterol (VENTOLIN HFA) 108 (90 Base) MCG/ACT inhaler Inhale 1 puff into the lungs every 4 (four) hours as needed for wheezing or shortness of breath.     ARIPiprazole (ABILIFY) 2 MG tablet Take 2 mg by mouth at bedtime.      BELBUCA 450 MCG FILM 450 mcg by Transmucosal route every 12 (twelve) hours.     DULoxetine (CYMBALTA) 30 MG capsule Take 30 mg by mouth daily.     feeding supplement, GLUCERNA SHAKE, (GLUCERNA SHAKE) LIQD Take 237 mLs by mouth 3 (three) times daily between meals. 90 Can 2   folic acid (FOLVITE) 1 MG tablet Take 1 tablet (1 mg total) by mouth daily. 30 tablet 5   gabapentin (NEURONTIN) 600 MG tablet Take 600 mg by mouth 3 (three) times daily.      hydrOXYzine (ATARAX/VISTARIL) 25 MG tablet Take 25 mg by mouth 2 (two) times daily as needed for anxiety.      ibuprofen (ADVIL,MOTRIN) 800 MG tablet Take 800 mg by mouth 3 (three) times daily as needed for mild pain.      insulin aspart (NOVOLOG) 100 UNIT/ML FlexPen Take 5 units before breakfast,   before lunch, and 5 units before dinner (5 units before each meal) (Patient taking differently: Inject 10 Units into the skin 3 (three) times daily with meals. ) 3 mL 3   LEVEMIR FLEXTOUCH 100 UNIT/ML Pen 4 units Twice a day (Patient  taking differently: Inject 16 Units into the skin 2 (two) times daily. ) 15 mL 0   lipase/protease/amylase (CREON) 36000 UNITS CPEP capsule TAKE 1 CAPSULE BY MOUTH 3  TIMES DAILY BEFORE MEALS (Patient taking differently: Take 36,000 Units by mouth 3 (three) times daily before meals. ) 300 capsule 1   magnesium citrate SOLN Take 2.5 mLs by mouth daily as needed for mild constipation or moderate constipation.      naloxone (NARCAN) nasal spray 4 mg/0.1 mL Place 1 spray into the nose daily as needed (opioid reversal).       oxyCODONE-acetaminophen (PERCOCET) 10-325 MG tablet Take 1 tablet by mouth every 8 (eight) hours as needed for pain.     pantoprazole (PROTONIX) 40 MG tablet Take 1 tablet (40 mg total) by mouth daily. (Patient taking differently: Take 40 mg by mouth daily as needed (GERD). ) 30 tablet 5   Vitamin D, Ergocalciferol, (DRISDOL) 1.25 MG (50000 UNIT) CAPS capsule Take 50,000 Units by mouth once a week.     ACCU-CHEK SOFTCLIX LANCETS lancets Use 3 times daily to check blood sugar. diag code E11.42. Insulin dependent 100 each 5   Alcohol Swabs (B-D SINGLE USE SWABS REGULAR) PADS Patient uses 6 times a day. Patient tests 3 times per day and administers insulin 3 times a day. ICD 10 code E11.42 200 each 5   ammonium lactate (LAC-HYDRIN) 12 % cream Apply topically as needed for dry skin. (Patient not taking: Reported on 08/15/2019) 385 g 0   Blood Glucose Calibration (ACCU-CHEK AVIVA) SOLN 1 drop by In Vitro route as needed. diag code E08.10 insulin dependent. Use as device directs 1 each 4   Blood Glucose Monitoring Suppl (ACCU-CHEK AVIVA PLUS) w/Device KIT 1 kit by Does not apply route every morning. diag code E11.42. Insulin dependent 1 kit 0   diclofenac sodium (VOLTAREN) 1 % GEL Apply 2 g topically 4 (four) times daily -  before meals and at bedtime. (Patient not taking: Reported on 07/25/2019) 100 g 5   gabapentin (NEURONTIN) 300 MG capsule TAKE 3 CAPSULES BY MOUTH  EVERY MORNING AND  AFTERNOON. TAKE 4 CAPSULES  BY MOUTH AT NIGHT BEFORE  BED (Patient not taking: Reported on 04/10/2019) 300 capsule 5   glucose blood (ACCU-CHEK AVIVA PLUS) test strip Use 3 times daily to check blood sugar. diag code E11.42. Insulin dependent 300 each 12   Multiple Vitamin (MULTIVITAMIN WITH MINERALS) TABS tablet Take 1 tablet by mouth daily. (Patient not taking: Reported on 08/20/2019) 30 tablet 4   omega-3 acid ethyl esters (LOVAZA) 1 g capsule Take 1 capsule (1 g total) by mouth daily. (Patient not taking: Reported  on 08/01/2019) 30 capsule 3   pravastatin (PRAVACHOL) 10 MG tablet Take 1 tablet (10 mg total) by mouth daily at 6 PM. (Patient not taking: Reported on 07/26/2019) 30 tablet 5   thiamine (VITAMIN B-1) 100 MG tablet Take 1 tablet (100 mg total) by mouth daily. (Patient not taking: Reported on 07/25/2019) 30 tablet 5     Scheduled Meds:  ARIPiprazole  2 mg Per Tube QHS   Chlorhexidine Gluconate Cloth  6 each Topical Q0600   docusate  100 mg Per Tube BID   feeding supplement (PROSource TF)  45 mL Per Tube Daily   feeding supplement (VITAL 1.5 CAL)  1,000 mL Per Tube Q24H   free water  100 mL Per Tube Q8H   gabapentin  600 mg Per Tube Q8H   insulin aspart  0-15 Units Subcutaneous  Q4H   lidocaine  1 application Urethral Once   mouth rinse  15 mL Mouth Rinse BID   polyethylene glycol  17 g Oral Daily   sodium chloride flush  10-40 mL Intracatheter Q12H   Continuous Infusions:  ceFEPime (MAXIPIME) IV Stopped (08/16/19 0039)   dextrose 60 mL/hr at 08/16/19 0503   levETIRAcetam Stopped (08/15/19 2043)   norepinephrine (LEVOPHED) Adult infusion 15 mcg/min (08/16/19 0500)   PRN Meds:.dextrose, docusate sodium, fentaNYL (SUBLIMAZE) injection, haloperidol lactate, midazolam, sodium chloride flush  Allergies:  Allergies  Allergen Reactions   Ace Inhibitors Swelling and Other (See Comments)    Angioedema - unquestionable   Losartan Swelling and Other (See Comments)    Pt presented with unquestionable angioedema on ACEIs (Angiotensin-converting enzyme (ACE) inhibitors) so aviod ARBs (Angiotensin receptor blockers), as well   Tylenol [Acetaminophen] Other (See Comments)    "cannot have this" (takes Percocet at home)    Family History  Problem Relation Age of Onset   Hypertension Sister    Diabetes Sister    Heart disease Sister    Colon cancer Neg Hx    Stomach cancer Neg Hx    Anesthesia problems Neg Hx    Hypotension Neg Hx    Pseudochol deficiency Neg Hx     Malignant hyperthermia Neg Hx     Social History:  reports that he has been smoking cigarettes. He has a 21.00 pack-year smoking history. He has never used smokeless tobacco. He reports current alcohol use of about 2.0 standard drinks of alcohol per week. He reports previous drug use. Drug: Marijuana.  ROS: A complete review of systems was performed.  All systems are negative except for pertinent findings as noted.  Physical Exam:  Vital signs in last 24 hours: Temp:  [96.9 F (36.1 C)-98.2 F (36.8 C)] 97.9 F (36.6 C) (07/24 0400) Pulse Rate:  [78-103] 83 (07/24 0534) Resp:  [14-36] 32 (07/24 0534) BP: (77-123)/(53-94) 115/78 (07/24 0534) SpO2:  [84 %-100 %] 99 % (07/24 0534) FiO2 (%):  [40 %-80 %] 40 % (07/23 1110) Weight:  [54.5 kg] 54.5 kg (07/24 0500) Constitutional:  Lying in bed, responsive, mitts in place Cardiovascular: Regular rate and rhythm Respiratory: Normal respiratory effort GI: Abdomen is soft, nontender, nondistended, no abdominal masses GU: Phimosis with edematous foreskin, able to retract slightly, far enough to visualize meatus. Small abrasion on outer foreskin. Meatus appears normal without lesions.  Lymphatic: No lymphadenopathy Neurologic: Grossly intact, no focal deficits Psychiatric: Normal mood and affect  Laboratory Data:  Recent Labs    08/20/2019 0830 08/08/2019 0840 08/02/2019 1127 08/07/2019 1148 08/13/2019 1614 08/15/19 0456 08/15/19 0529  WBC 22.3*  --   --  15.3*  --  10.3  --   HGB 14.2   < > 14.6 13.3 14.6 12.9* 12.6*  HCT 44.9   < > 43.0 40.5 43.0 36.4* 37.0*  PLT 156  --   --  120*  --  69*  --    < > = values in this interval not displayed.    Recent Labs    08/15/19 1006 08/15/19 1722 08/15/19 1900 08/16/19 0000 08/16/19 0505  NA 152* 151* 148* 150* 148*  K 3.5 4.0 4.3 3.8 5.0  CL 124* 124* 121* 128* 123*  GLUCOSE 95 199* 212* 123* 188*  BUN <5* 6* 6* 6* 8  CALCIUM 8.0* 7.8* 7.7* 6.5* 7.7*  CREATININE 0.84 0.84 0.79 0.60*  0.66   Estimated Creatinine Clearance: 74.7 mL/min (by C-G formula based  on SCr of 0.66 mg/dL).  Procedure:  Patient was prepped and draped in the usual sterile manner. Lidocaine urojet was applied per urethra. A 12 Fr foley catheter was then placed, without any resistance in the urethra. 10cc of sterile water was placed in the balloon. There was return of clear yellow urine. Patient tolerated the procedure well.   Impression/Recommendation 64 y.o. male with phimosis and urinary retention in the setting of altered mental status.  1. Continue foley catheter x7 days for bladder stretch injury. Ok to repeat TOV in house if patient remains hospitalized at that time 2. Follow up with Urology outpatient in ~1 month for discussion of phimosis   Carmie Kanner 08/16/2019, 6:13 AM

## 2019-08-16 NOTE — Progress Notes (Signed)
Progress Note:  Subjective: He is awake, in possey mitts and unable to provide any meaningful history. Denies any pain.  Objective:  Temp:  [96.9 F (36.1 C)-98.3 F (36.8 C)] 98 F (36.7 C) (07/24 1537) Pulse Rate:  [78-103] 97 (07/24 1400) Resp:  [14-36] 33 (07/24 1400) BP: (77-128)/(53-94) 111/80 (07/24 1400) SpO2:  [84 %-100 %] 94 % (07/24 1400) Weight:  [54.5 kg] 54.5 kg (07/24 0500) Body mass index is 20.62 kg/m.  General: Laying comfortably in bed; in no acute distress. Appears cachectic. HENT: Normal oropharynx and mucosa. Normal external appearance of ears and nose Neck: Supple, no pain or tenderness. CV: RRR without murmur. No peripheral edema. Pulmonary: Symmetric chest rise. Normal respiratoy effort. Abdomen: positive bowel sounds, soft, non-tender. Normal liver and spleen size. Ext: No cyanosis, edema, or deformity. Skin: No rash. Normal palpation of skin. Musculoskeletal: Normal digits and nails by inspection. No clubbing.  Neurologic Examination  MENTAL STATUS: AAOx1. LANG/SPEECH: states his name, does not follow commands. Dysarthric speech. CRANIAL NERVES: II: Pupils equal and reactive, blinks to threat III, IV, VI: Tracks my face to the left and right, no gaze preference or deviation, no nystagmus, has exotropia. VII: no asymmetry VIII: normal hearing to speech XII: midline tongue protrusion Does not participate with strength testing but is moving all extremities.  REFLEXES: 2/4 throughout, bilateral flexor planter response, no Hoffman's, no clonus SENSORY: Normal to touch, pinprick, vibration, temp all limbs No hemineglect, no extinction to double sided stimulation (visual & tactile) Coordination: Unable to assess.  Imaging/Labs/Diagnostics: IMPRESSION: Moderately motion degraded examination, limiting evaluation.  No evidence of acute intracranial abnormality.  Stable moderate generalized parenchymal atrophy and chronic small vessel ischemic  disease. Redemonstrated chronic thalamic lacunar infarcts.  Impression and plan: Michael [name] is a 64 y.o. male with insulin-dependent diabetes presents with multiple seizures and altered mental status in the setting of severe hyperglycemia/DKA with glucose >1000. CT head neg for acute findings. His neuro exam today appear improved significantly from 2 days ago. He is oriented to self and he does not appear to have any focal deficit. He likely had provoked status epilepticus in the setting of severe Hyperglycemia/DKA.  Impression: Provoked Status epilepticus in the setting of Severe Hyperglycemia/DKA.  - Would not recommend continuing Keppra given this is a provoked seizure. - Please let Michael [name] know if he has any more seizures. - Neurology inpatient team will signoff. Please feel free to contact Michael [name] with any questions or concerns. ______________________________________________________________________  Thank you for the opportunity to take part in the care of this patient. If you have any further questions, please contact the neurology consultation attending on call. Signed,   [place] Pager Number [phone]

## 2019-08-16 NOTE — Progress Notes (Signed)
Pt yet to void all night. 2 I/O's failed on day shift. Bladder scan on night shift showed 557mL. Coude unsuccessful. [place] notified. Urology consulted. Per urology attempt 78F foley. Could not advance into meatus. Followed up with urology. Urology cart now at bedside, awaiting MD.

## 2019-08-17 ENCOUNTER — Inpatient Hospital Stay (HOSPITAL_COMMUNITY): Payer: Medicare HMO

## 2019-08-17 DIAGNOSIS — J9600 Acute respiratory failure, unspecified whether with hypoxia or hypercapnia: Secondary | ICD-10-CM

## 2019-08-17 LAB — CBC WITH DIFFERENTIAL/PLATELET
Abs Immature Granulocytes: 0.05 10*3/uL (ref 0.00–0.07)
Abs Immature Granulocytes: 0.07 10*3/uL (ref 0.00–0.07)
Basophils Absolute: 0 10*3/uL (ref 0.0–0.1)
Basophils Absolute: 0 10*3/uL (ref 0.0–0.1)
Basophils Relative: 0 %
Basophils Relative: 0 %
Eosinophils Absolute: 0 10*3/uL (ref 0.0–0.5)
Eosinophils Absolute: 0 10*3/uL (ref 0.0–0.5)
Eosinophils Relative: 0 %
Eosinophils Relative: 0 %
HCT: 36.1 % — ABNORMAL LOW (ref 39.0–52.0)
HCT: 30.8 % — ABNORMAL LOW (ref 39.0–52.0)
Hemoglobin: 12.8 g/dL — ABNORMAL LOW (ref 13.0–17.0)
Hemoglobin: 10.8 g/dL — ABNORMAL LOW (ref 13.0–17.0)
Immature Granulocytes: 1 %
Immature Granulocytes: 1 %
Lymphocytes Relative: 14 %
Lymphocytes Relative: 27 %
Lymphs Abs: 1.6 10*3/uL (ref 0.7–4.0)
Lymphs Abs: 1.9 10*3/uL (ref 0.7–4.0)
MCH: 31.1 pg (ref 26.0–34.0)
MCH: 31 pg (ref 26.0–34.0)
MCHC: 35.5 g/dL (ref 30.0–36.0)
MCHC: 35.1 g/dL (ref 30.0–36.0)
MCV: 87.8 fL (ref 80.0–100.0)
MCV: 88.5 fL (ref 80.0–100.0)
Monocytes Absolute: 0.2 10*3/uL (ref 0.1–1.0)
Monocytes Absolute: 0.5 10*3/uL (ref 0.1–1.0)
Monocytes Relative: 2 %
Monocytes Relative: 7 %
Neutro Abs: 9 10*3/uL — ABNORMAL HIGH (ref 1.7–7.7)
Neutro Abs: 4.6 10*3/uL (ref 1.7–7.7)
Neutrophils Relative %: 83 %
Neutrophils Relative %: 65 %
Platelets: 29 10*3/uL — CL (ref 150–400)
Platelets: 26 10*3/uL — CL (ref 150–400)
RBC: 4.11 MIL/uL — ABNORMAL LOW (ref 4.22–5.81)
RBC: 3.48 MIL/uL — ABNORMAL LOW (ref 4.22–5.81)
RDW: 14.5 % (ref 11.5–15.5)
RDW: 14.9 % (ref 11.5–15.5)
WBC Morphology: INCREASED
WBC: 10.8 10*3/uL — ABNORMAL HIGH (ref 4.0–10.5)
WBC: 7.2 10*3/uL (ref 4.0–10.5)
nRBC: 0.2 % (ref 0.0–0.2)
nRBC: 0 % (ref 0.0–0.2)

## 2019-08-17 LAB — BASIC METABOLIC PANEL
Anion gap: 4 — ABNORMAL LOW (ref 5–15)
Anion gap: 5 (ref 5–15)
Anion gap: 5 (ref 5–15)
BUN: 10 mg/dL (ref 8–23)
BUN: 11 mg/dL (ref 8–23)
BUN: 12 mg/dL (ref 8–23)
CO2: 20 mmol/L — ABNORMAL LOW (ref 22–32)
CO2: 21 mmol/L — ABNORMAL LOW (ref 22–32)
CO2: 21 mmol/L — ABNORMAL LOW (ref 22–32)
Calcium: 7 mg/dL — ABNORMAL LOW (ref 8.9–10.3)
Calcium: 7.1 mg/dL — ABNORMAL LOW (ref 8.9–10.3)
Calcium: 7.3 mg/dL — ABNORMAL LOW (ref 8.9–10.3)
Chloride: 118 mmol/L — ABNORMAL HIGH (ref 98–111)
Chloride: 119 mmol/L — ABNORMAL HIGH (ref 98–111)
Chloride: 119 mmol/L — ABNORMAL HIGH (ref 98–111)
Creatinine, Ser: 0.48 mg/dL — ABNORMAL LOW (ref 0.61–1.24)
Creatinine, Ser: 0.48 mg/dL — ABNORMAL LOW (ref 0.61–1.24)
Creatinine, Ser: 0.5 mg/dL — ABNORMAL LOW (ref 0.61–1.24)
GFR calc Af Amer: >60 mL/min (ref >60)
GFR calc Af Amer: >60 mL/min (ref >60)
GFR calc Af Amer: >60 mL/min (ref >60)
GFR calc non Af Amer: >60 mL/min (ref >60)
GFR calc non Af Amer: >60 mL/min (ref >60)
GFR calc non Af Amer: >60 mL/min (ref >60)
Glucose, Bld: 121 mg/dL — ABNORMAL HIGH (ref 70–99)
Glucose, Bld: 177 mg/dL — ABNORMAL HIGH (ref 70–99)
Glucose, Bld: 233 mg/dL — ABNORMAL HIGH (ref 70–99)
Potassium: 3.6 mmol/L (ref 3.5–5.1)
Potassium: 3.8 mmol/L (ref 3.5–5.1)
Potassium: 3.8 mmol/L (ref 3.5–5.1)
Sodium: 143 mmol/L (ref 135–145)
Sodium: 144 mmol/L (ref 135–145)
Sodium: 145 mmol/L (ref 135–145)

## 2019-08-17 LAB — POCT I-STAT 7, (LYTES, BLD GAS, ICA,H+H)
Acid-base deficit: 5 mmol/L — ABNORMAL HIGH (ref 0.0–2.0)
Bicarbonate: 20.9 mmol/L (ref 20.0–28.0)
Calcium, Ion: 1.18 mmol/L (ref 1.15–1.40)
HCT: 35 % — ABNORMAL LOW (ref 39.0–52.0)
Hemoglobin: 11.9 g/dL — ABNORMAL LOW (ref 13.0–17.0)
O2 Saturation: 100 %
Patient temperature: 98.6
Potassium: 3.7 mmol/L (ref 3.5–5.1)
Sodium: 149 mmol/L — ABNORMAL HIGH (ref 135–145)
TCO2: 22 mmol/L (ref 22–32)
pCO2 arterial: 41.2 mmHg (ref 32.0–48.0)
pH, Arterial: 7.314 — ABNORMAL LOW (ref 7.350–7.450)
pO2, Arterial: 214 mmHg — ABNORMAL HIGH (ref 83.0–108.0)

## 2019-08-17 LAB — GLUCOSE, CAPILLARY
Glucose-Capillary: 186 mg/dL — ABNORMAL HIGH (ref 70–99)
Glucose-Capillary: 97 mg/dL (ref 70–99)
Glucose-Capillary: 60 mg/dL — ABNORMAL LOW (ref 70–99)
Glucose-Capillary: 151 mg/dL — ABNORMAL HIGH (ref 70–99)
Glucose-Capillary: 146 mg/dL — ABNORMAL HIGH (ref 70–99)
Glucose-Capillary: 121 mg/dL — ABNORMAL HIGH (ref 70–99)

## 2019-08-17 MED ORDER — SODIUM CHLORIDE 0.9 % IV SOLN
1.0000 g | Freq: Two times a day (BID) | INTRAVENOUS | Status: AC
  Administered 2019-08-17 – 2019-08-20 (×6): 1 g via INTRAVENOUS
  Filled 2019-08-17 (×3): qty 10
  Filled 2019-08-17: qty 1
  Filled 2019-08-17 (×3): qty 10

## 2019-08-17 MED ORDER — PHENYLEPHRINE 40 MCG/ML (10ML) SYRINGE FOR IV PUSH (FOR BLOOD PRESSURE SUPPORT)
PREFILLED_SYRINGE | INTRAVENOUS | Status: AC
  Administered 2019-08-17: 20 ug
  Filled 2019-08-17: qty 10

## 2019-08-17 MED ORDER — MIDAZOLAM HCL 2 MG/2ML IJ SOLN: 1.0000 mg | Freq: Once | INTRAMUSCULAR | Status: DC

## 2019-08-17 MED ORDER — SUCCINYLCHOLINE CHLORIDE 200 MG/10ML IV SOSY
PREFILLED_SYRINGE | INTRAVENOUS | Status: AC
  Filled 2019-08-17: qty 10

## 2019-08-17 MED ORDER — MIDAZOLAM HCL 2 MG/2ML IJ SOLN
INTRAMUSCULAR | Status: AC
  Administered 2019-08-17: 1 mg
  Filled 2019-08-17: qty 2

## 2019-08-17 MED ORDER — ETOMIDATE 2 MG/ML IV SOLN
INTRAVENOUS | Status: AC
  Administered 2019-08-17: 20 mg
  Filled 2019-08-17: qty 20

## 2019-08-17 MED ORDER — CHLORHEXIDINE GLUCONATE 0.12% ORAL RINSE (MEDLINE KIT)
15.0000 mL | Freq: Two times a day (BID) | OROMUCOSAL | Status: DC
  Administered 2019-08-17 – 2019-09-01 (×28): 15 mL via OROMUCOSAL

## 2019-08-17 MED ORDER — PANTOPRAZOLE SODIUM 40 MG IV SOLR
40.0000 mg | INTRAVENOUS | Status: DC
  Administered 2019-08-17: 40 mg via INTRAVENOUS
  Filled 2019-08-17: qty 40

## 2019-08-17 MED ORDER — POTASSIUM CHLORIDE 20 MEQ/15ML (10%) PO SOLN
40.0000 meq | ORAL | Status: AC
  Administered 2019-08-17 (×2): 40 meq via ORAL
  Filled 2019-08-17 (×2): qty 30

## 2019-08-17 MED ORDER — MIDAZOLAM HCL 2 MG/2ML IJ SOLN
INTRAMUSCULAR | Status: AC
  Administered 2019-08-17: 2 mg
  Filled 2019-08-17: qty 4

## 2019-08-17 MED ORDER — PHENYLEPHRINE 40 MCG/ML (10ML) SYRINGE FOR IV PUSH (FOR BLOOD PRESSURE SUPPORT): 200.0000 ug | PREFILLED_SYRINGE | Freq: Once | INTRAVENOUS | Status: DC | PRN

## 2019-08-17 MED ORDER — FUROSEMIDE 10 MG/ML IJ SOLN
40.0000 mg | INTRAMUSCULAR | Status: AC
  Administered 2019-08-17 (×2): 40 mg via INTRAVENOUS
  Filled 2019-08-17 (×2): qty 4

## 2019-08-17 MED ORDER — ORAL CARE MOUTH RINSE
15.0000 mL | OROMUCOSAL | Status: DC
  Administered 2019-08-17 – 2019-08-23 (×54): 15 mL via OROMUCOSAL

## 2019-08-17 MED ORDER — SODIUM CHLORIDE 0.9 % IV SOLN
INTRAVENOUS | Status: DC | PRN
  Administered 2019-09-05: 250 mL via INTRA_ARTERIAL
  Administered 2019-09-05: 500 mL via INTRA_ARTERIAL
  Administered 2019-09-05: 250 mL via INTRA_ARTERIAL

## 2019-08-17 MED ORDER — ETOMIDATE 2 MG/ML IV SOLN
INTRAVENOUS | Status: AC
  Administered 2019-08-17: 2 mg
  Filled 2019-08-17: qty 10

## 2019-08-17 MED ORDER — ROCURONIUM BROMIDE 10 MG/ML (PF) SYRINGE
PREFILLED_SYRINGE | INTRAVENOUS | Status: AC
  Filled 2019-08-17: qty 10

## 2019-08-17 MED ORDER — ROCURONIUM BROMIDE 50 MG/5ML IV SOLN
100.0000 mg | Freq: Once | INTRAVENOUS | Status: DC
  Filled 2019-08-17: qty 10

## 2019-08-17 MED ORDER — ETOMIDATE 2 MG/ML IV SOLN: 20.0000 mg | Freq: Once | INTRAVENOUS | Status: DC

## 2019-08-17 MED ORDER — FENTANYL BOLUS VIA INFUSION
50.0000 ug | INTRAVENOUS | Status: DC | PRN
  Administered 2019-08-17: 50 ug via INTRAVENOUS
  Filled 2019-08-17: qty 50

## 2019-08-17 MED ORDER — FENTANYL CITRATE (PF) 100 MCG/2ML IJ SOLN: 50.0000 ug | Freq: Once | INTRAMUSCULAR | Status: DC

## 2019-08-17 MED ORDER — FENTANYL 2500MCG IN NS 250ML (10MCG/ML) PREMIX INFUSION
50.0000 ug/h | INTRAVENOUS | Status: DC
  Administered 2019-08-17: 50 ug/h via INTRAVENOUS
  Administered 2019-08-18: 150 ug/h via INTRAVENOUS
  Administered 2019-08-18 – 2019-08-19 (×2): 100 ug/h via INTRAVENOUS
  Filled 2019-08-17 (×6): qty 250

## 2019-08-17 MED ORDER — FUROSEMIDE 10 MG/ML IJ SOLN
INTRAMUSCULAR | Status: AC
  Filled 2019-08-17: qty 4

## 2019-08-17 MED ORDER — FUROSEMIDE 10 MG/ML IJ SOLN: 40.0000 mg | Freq: Once | INTRAMUSCULAR | Status: DC

## 2019-08-17 MED ORDER — FENTANYL CITRATE (PF) 100 MCG/2ML IJ SOLN
100.0000 ug | Freq: Once | INTRAMUSCULAR | Status: AC
  Administered 2019-08-17: 100 ug via INTRAVENOUS

## 2019-08-17 MED ORDER — ROCURONIUM BROMIDE 10 MG/ML (PF) SYRINGE
PREFILLED_SYRINGE | INTRAVENOUS | Status: AC
  Administered 2019-08-17: 10 mg via INTRAVENOUS
  Filled 2019-08-17: qty 10

## 2019-08-17 NOTE — Procedures (Signed)
Intubation Procedure Note  Michael [name]  [id number]  26-Jan-1955  Date:08/17/19  Time:4:59 AM   Provider Performing:Suvan Stcyr A Scotti Kosta    Procedure: Intubation (31500)  Indication(s) Respiratory Failure  Consent Unable to obtain consent due to emergent nature of procedure.   Anesthesia Etomidate, Versed, Fentanyl and Rocuronium 10mg  etomidate, 1mg  versed, 12mcg fentanyl, 10mg  rocuronium  Time Out Verified patient identification, verified procedure, site/side was marked, verified correct patient position, special equipment/implants available, medications/allergies/relevant history reviewed, required imaging and test results available.   Sterile Technique Usual hand hygeine, masks, and gloves were used   Procedure Description Patient positioned in bed supine.  Sedation given as noted above.  Patient was intubated with endotracheal tube using Glidescope.  View was Grade 2 only posterior commissure .  Number of attempts was 1.  Colorimetric CO2 detector was consistent with tracheal placement.   Complications/Tolerance None; patient tolerated the procedure well. Chest X-ray is ordered to verify placement.  Hypotension post intubation, was started on levophed, 200 mcg neosynephrine pushed  EBL none   Specimen(s) None

## 2019-08-17 NOTE — Procedures (Signed)
Intubation Procedure Note  ONIX JUMPER  [id number]  28-May-1955  Date:08/17/19  Time:5:03 PM   Provider Performing:Jenisis Harmsen Duwayne Heck    Procedure: Intubation (31500)  Indication(s) Respiratory Failure  Consent Unable to obtain consent due to emergent nature of procedure.   Anesthesia Etomidate and Versed   Time Out Verified patient identification, verified procedure, site/side was marked, verified correct patient position, special equipment/implants available, medications/allergies/relevant history reviewed, required imaging and test results available.   Sterile Technique Usual hand hygeine, masks, and gloves were used   Procedure Description Initially attempted tube exchange with a bougie for new ET tube.   Significant leak on expiratory volume.  Old tube pulled over bouge, bougie may have been inadvertently pulled out slightly, new tube passed over easily.  New tube sounded like it had been placed in the esophagus, on auscultation.  Tube removed and used glidescope to reintubate, after bagging for a short period.   SPO2 temporarily dropped, though BP remained stable.   SPo2 in 90s after bagging.  Patient positioned in bed supine.  Sedation given as noted above.  Patient was intubated with endotracheal tube using Glidescope.  View was Grade 2 only posterior commissure .  Number of attempts was 2.  .   Colorimetric CO2 detector was consistent with tracheal placement.  Complications/Tolerance None; patient tolerated the procedure well. Chest X-ray is ordered to verify placement.   EBL none   Specimen(s) None

## 2019-08-17 NOTE — Progress Notes (Signed)
RT pulled ETT back 2cm per CCM request. Patient had good return volumes.

## 2019-08-17 NOTE — Progress Notes (Signed)
PCCM Interval progress note:  Pt with worsening respiratory status overnight, hypoxic, tachypneic and dyspneic.  Decision made to re-intubate.   Pt's sister called and updated.   Otilio Carpen Joelynn Dust, PA-C

## 2019-08-17 NOTE — Progress Notes (Signed)
[facility] Progress Note Patient Name: BUBBER ROTHERT DOB: [birth year] MRN: [id number]   Date of Service  08/17/2019  HPI/Events of Note  Increasing O2 requirement. Poor cough, no gag. Sat = 88% and RR = 36. Extubated 7/23 with copious secretions according to nursing and RT. Re-intubate?  eICU Interventions  Plan: 1. ABG STAT. 2. Will ask ground team to evaluate the patient at bedside.      Intervention Category Major Interventions: Respiratory failure - evaluation and management;Hypoxemia - evaluation and management  Lysle Dingwall 08/17/2019, 4:33 AM

## 2019-08-17 NOTE — Progress Notes (Signed)
NAME:  Michael [name], MRN:  [id number], DOB:  1955/03/15, LOS: 3 ADMISSION DATE:  07/26/2019, CONSULTATION DATE: 08/13/2019 REFERRING MD: EDP, CHIEF COMPLAINT: Altered mental status in the setting of hyperglycemia suspected pneumonia complicated by poor nutritional status and failure to thrive.  Brief History   64 year old with history of noncompliance insulin-dependent diabetes mellitus who presents 08/13/2019 with seizure activity,  altered mental status, hyperglycemia, and hypokalemic.  Pulmonary critical care asked to admit  Past Medical History   Past Medical History:  Diagnosis Date  . Anxiety   . Arthritis    "joints; shoulders; feet" (06/08/2016)  . Bipolar disorder ([place])   . Daily headache    "7:30 - 8:00 q night" (06/08/2016)  . Diabetic peripheral neuropathy ([place])   . GERD (gastroesophageal reflux disease)   . History of hepatitis B virus infection conferring immunity 03/22/2018  . Pancreatitis   . Schizophrenia ([place])   . Seizures ([place]) 01/2016 X 2  . Type II diabetes mellitus ([place])      Significant Hospital Events   7/22 >Presented to the emergency department altered mental status glucose greater than 1400 and noted to have grand mal seizure prior to admission.  Intubated for worsening hypoxia and respiratory failure, developed progressive hypovolemic shock required significant volume resuscitation in central venous access with pressors  7/23 > Off pressors.  Chest x-ray improved.  Weaning ventilator support, glycemic control improved, profoundly hypernatremic with multiple metabolic derangements.    7/25 > Developed respiratory distress with hypoxia overnight resulting in reintubation by night team  Consults:  08/12/2019 neurology s/o 7/24  Procedures:  A-line 7/25 ETT 7/25  Significant Diagnostic Tests:    Micro Data:  08/02/2019 blood culture x2>> 08/04/2019 urine culture>> 08/10/2019 Legionella>> 08/02/2019 COVID-19 testing>> pending at the time of this  dictation  Antimicrobials:  08/10/2019 vancomycin>> 7/23 08/08/2019 cefepime>>  Interim history/subjective:  Required reintubation overnight due to respiratory distress and hypoxia   Objective   Blood pressure 102/80, pulse 80, temperature 98.5 F (36.9 C), temperature source Oral, resp. rate 18, height 5\' 4"  (1.626 m), weight 56 kg, SpO2 100 %.    Vent Mode: PRVC FiO2 (%):  [80 %-100 %] 80 % Set Rate:  [16 bmp] 16 bmp Vt Set:  [470 mL] 470 mL PEEP:  [5 cmH20] 5 cmH20 Plateau Pressure:  [22 cmH20-23 cmH20] 22 cmH20   Intake/Output Summary (Last 24 hours) at 08/17/2019 1028 Last data filed at 08/17/2019 1000 Gross per 24 hour  Intake 2816.9 ml  Output 1330 ml  Net 1486.9 ml   Filed Weights   08/15/19 0454 08/16/19 0500 08/17/19 0251  Weight: 51.5 kg 54.5 kg 56 kg    Examination: General: Chronically ill appearing cachetic middle aged male lying in bed in no acute distress HEENT: ETT, MM pink/moist, PERRL, sclera incteric Neuro: Sedated on vent, will open eyes to physical stimuli CV: s1s2 regular rate and rhythm, no murmur, rubs, or gallops,  PULM:  Bilateral rhonchi, tolerating vent well, no increased work of breathing  GI: soft, bowel sounds active in all 4 quadrants, non-tender, non-distended Extremities: warm/dry, no edema  Skin: no rashes or lesions  Resolved Hospital Problem list   Acute anion gap metabolic acidosis -In the setting of lactic acidosis: Anion gap now closed, Acid-base improved Severe hypernatremia Hypokalemia Post resuscitation hyperchloremia  Assessment & Plan:  Nonketotic hyperosmolar hyperglycemia with  -His beta hydroxybutyric acid was negative -Has been titrated off insulin drip  Profound hypovolemic shock on admission -Appears patient required pressor  support on admission but this was weaned off after IV resuscitation. After reintubation and need of sedating meds patient is now again requiring pressor support  P: Episode of hypoglycemia  this am with TF stopped, resume TF Continue SSI  Monitor CBG q4hrs  Remains on D5 drip, titate off  Goal for euvolemia  Continue levo for MAP goal of 65 Wean sedation as able   Acute hypoxic respiratory failure in the setting of multifocal pneumonia Suspect aspiration -Extubated 7/23 but required reintubation 7/25  -CXR with worsened opacities 7/25 P: Continue ventilator support with lung protective strategies  Wean PEEP and FiO2 for sats greater than 90%. Head of bed elevated 30 degrees. Plateau pressures less than 30 cm H20.  Follow intermittent chest x-ray and ABG.   SAT/SBT as tolerated, mentation preclude extubation  Ensure adequate pulmonary hygiene  Follow cultures  VAP bundle in place  PAD protocol Continue IV cefepime  Diurese as below   Worsening thrombocytopenia  -Per chart review it appears chronically low with an average of 160's. plt count has progressively declined with a count of 29 morning of 7/25 -Likely due to acute illness and worsening ARDS  P: Pharmacologic DVT prophylax on hold Continue to trend Plt, repeat today  Monitor for signs of bleeding  Transfuse for count less than 10  Fluid and electrolyte imbalances: Severe hypernatremia  -improving with D5 water 8L net positive  -CXR with interstitial edema 7/25 P: Gentle diurese  Follow volume status  Monitor urine output  Titrate D5 water off  Reported seizure activity prior to admission -Neurology consulted during [facility] (s/o 7/24) and they did not recommend continuing Keppra as this was felt to be a provoked seizure in the setting of sever hyperglycemia  P: Seizure precautions As needed Benzos Continue Keppra for now   Acute metabolic encephalopathy  -in the setting of profound metabolic acidosis, hypoxia, shock, likely further exacerbated by underlying schizophrenia and what may have been a postictal state post seizure Delirium P: Continue PAD protocol Minimize sedation as able    Resume Abilify, Cymbalta, and Neurontin when taking PO  Delirium precautions   Chronic pancreatitis P: Resume home Creon when able  Intermittently trend amalyse and lipase   Ileus, versus SBO.   -Seen by surgery. Abdomen fairly benign. Spoke w/ surgical team  P: Resume TF now that he is reintubated  Swallow eval once extubated   Generalized failure to thrive in a 64 year old extremely cachectic male P: TOC consulted   Best practice:  Diet: NPO-->trickle  Pain/Anxiety/Delirium protocol (if indicated): Currently altered mental status history of substance abuse monitor VAP protocol (if indicated): ordered DVT prophylaxis: Sequential hose hold heparin until altered mental status is further evaluate the possible CT of the head GI prophylaxis: PPI Glucose control: IV insulin-->SSI Mobility: Bedrest Code Status: Full Family Communication:  Disposition: cont ICU care   Labs   CBC: Recent Labs  Lab 08/07/2019 0830 07/29/2019 0840 07/30/2019 1148 07/31/2019 1148 08/16/2019 1614 08/15/19 0456 08/15/19 0529 08/16/19 1653 08/17/19 0641  WBC 22.3*  --  15.3*  --   --  10.3  --  10.8*  --   NEUTROABS 20.3*  --   --   --   --   --   --  9.0*  --   HGB 14.2   < > 13.3   < > 14.6 12.9* 12.6* 12.8* 11.9*  HCT 44.9   < > 40.5   < > 43.0 36.4* 37.0* 36.1* 35.0*  MCV 100.2*  --  96.2  --   --  87.9  --  87.8  --   PLT 156  --  120*  --   --  69*  --  29*  --    < > = values in this interval not displayed.    Basic Metabolic Panel: Recent Labs  Lab 08/19/2019 1148 08/12/2019 1451 08/15/19 1006 08/15/19 1201 08/15/19 1722 08/15/19 1900 08/16/19 0505 08/16/19 0505 08/16/19 1154 08/16/19 1653 08/16/19 1805 08/16/19 2335 08/17/19 0617 08/17/19 0641  NA 132*   < >   < >  --  151*   < > 148*   < > 147*  --  146* 145 144 149*  K >7.5*   < >   < >  --  4.0   < > 5.0   < > 4.4  --  4.0 3.8 3.8 3.7  CL 110   < >   < >  --  124*   < > 123*  --  122*  --  121* 119* 118*  --   CO2 16*   < >    < >  --  20*   < > 19*  --  19*  --  19* 21* 21*  --   GLUCOSE 1,073*   < >   < >  --  199*   < > 188*  --  135*  --  153* 121* 177*  --   BUN <5*   < >   < >  --  6*   < > 8  --  10  --  11 10 11   --   CREATININE 1.15   < >   < >  --  0.84   < > 0.66  --  0.55*  --  0.44* 0.48* 0.48*  --   CALCIUM 7.8*   < >   < >  --  7.8*   < > 7.7*  --  7.4*  --  7.4* 7.3* 7.1*  --   MG 1.4*  --   --  1.2* 1.8  --  1.6*  --   --  1.5*  --   --   --   --   PHOS 1.2*  --   --  1.2* 2.6  --  1.9*  --   --  1.8*  --   --   --   --    < > = values in this interval not displayed.   GFR: Estimated Creatinine Clearance: 76.8 mL/min (A) (by C-G formula based on SCr of 0.48 mg/dL (L)). Recent Labs  Lab 07/29/2019 0830 07/25/2019 1148 07/31/2019 1451 08/15/19 0456 08/16/19 1653  PROCALCITON  --  0.99  --   --   --   WBC 22.3* 15.3*  --  10.3 10.8*  LATICACIDVEN >11.0* 10.7* 7.7*  --   --     Liver Function Tests: Recent Labs  Lab 07/27/2019 0830 08/07/2019 1148  AST 46* 46*  ALT 143* 128*  ALKPHOS 377* 308*  BILITOT 0.8 0.6  PROT 5.3* 4.7*  ALBUMIN 2.9* 2.5*   Recent Labs  Lab 08/04/2019 1148  LIPASE 19  AMYLASE 383*   No results for input(s): AMMONIA in the last 168 hours.  ABG    Component Value Date/Time   PHART 7.314 (L) 08/17/2019 0641   PCO2ART 41.2 08/17/2019 0641   PO2ART 214 (H) 08/17/2019 0641   HCO3 20.9 08/17/2019 0641   TCO2 22 08/17/2019 0641  ACIDBASEDEF 5.0 (H) 08/17/2019 0641   O2SAT 100.0 08/17/2019 0641     Coagulation Profile: Recent Labs  Lab 08/19/2019 0830 08/12/2019 1148 08/16/19 1943  INR 1.3* 1.3* 1.4*    Cardiac Enzymes: No results for input(s): CKTOTAL, CKMB, CKMBINDEX, TROPONINI in the last 168 hours.  HbA1C: Hgb A1c MFr Bld  Date/Time Value Ref Range Status  03/11/2019 08:00 PM 14.8 (H) 4.8 - 5.6 % Final    Comment:    (NOTE) Pre diabetes:          5.7%-6.4% Diabetes:              >6.4% Glycemic control for   <7.0% adults with diabetes   03/27/2017  03:17 AM 11.1 (H) 4.8 - 5.6 % Final    Comment:    (NOTE) Pre diabetes:          5.7%-6.4% Diabetes:              >6.4% Glycemic control for   <7.0% adults with diabetes     CBG: Recent Labs  Lab 08/16/19 2003 08/16/19 2332 08/16/19 2334 08/17/19 0354 08/17/19 0751  GLUCAP 111* 86 110* 186* 97   CRITICAL CARE Performed by: Johnsie Cancel   Total critical care time: 40 minutes  Critical care time was exclusive of separately billable procedures and treating other patients.  Critical care was necessary to treat or prevent imminent or life-threatening deterioration.  Critical care was time spent personally by me on the following activities: development of treatment plan with patient and/or surrogate as well as nursing, discussions with consultants, evaluation of patient's response to treatment, examination of patient, obtaining history from patient or surrogate, ordering and performing treatments and interventions, ordering and review of laboratory studies, ordering and review of radiographic studies, pulse oximetry and re-evaluation of patient's condition.  Johnsie Cancel, NP-C [facility] Contact / Pager information can be found on Amion  08/17/2019, 10:40 AM

## 2019-08-17 NOTE — Progress Notes (Signed)
Was called to bedside for patient in respiratory distress  Significant secretions in oropharynx  Increased FiO2 requirement Extubated 7/23  Successfully reintubated with a size 8 endotracheal tube, 24 at the lip  Did have hypotension following intubation Did receive Neo-Synephrine 100 and another 100 Started on Levophed Received 500 cc saline bolus  Chest x-ray pending ABG in an hour

## 2019-08-17 NOTE — Procedures (Signed)
Arterial Catheter Insertion Procedure Note  Michael [name]  [id number]  1956/01/15  Date:08/17/19  Time:6:21 AM    Provider Performing: Roby Lofts Rebel Laughridge    Procedure: Insertion of Arterial Line [phone]) without US guidance  Indication(s) Blood pressure monitoring and/or need for frequent ABGs  Consent Unable to obtain consent due to emergent nature of procedure.  Anesthesia    Time Out Verified patient identification, verified procedure, site/side was marked, verified correct patient position, special equipment/implants available, medications/allergies/relevant history reviewed, required imaging and test results available.   Sterile Technique Maximal sterile technique including full sterile barrier drape, hand hygiene, sterile gown, sterile gloves, mask, hair covering, sterile ultrasound probe cover (if used).   Procedure Description Area of catheter insertion was cleaned with chlorhexidine and draped in sterile fashion. Without real-time ultrasound guidance an arterial catheter was placed into the right radial artery.  Appropriate arterial tracings confirmed on monitor.     Complications/Tolerance None; patient tolerated the procedure well.   EBL    Specimen(s) None

## 2019-08-18 LAB — MAGNESIUM: Magnesium: 1.6 mg/dL — ABNORMAL LOW (ref 1.7–2.4)

## 2019-08-18 LAB — CBC WITH DIFFERENTIAL/PLATELET
Abs Immature Granulocytes: 0.06 10*3/uL (ref 0.00–0.07)
Basophils Absolute: 0 10*3/uL (ref 0.0–0.1)
Basophils Relative: 1 %
Eosinophils Absolute: 0.1 10*3/uL (ref 0.0–0.5)
Eosinophils Relative: 1 %
HCT: 31.8 % — ABNORMAL LOW (ref 39.0–52.0)
Hemoglobin: 11.2 g/dL — ABNORMAL LOW (ref 13.0–17.0)
Immature Granulocytes: 1 %
Lymphocytes Relative: 44 %
Lymphs Abs: 2.3 10*3/uL (ref 0.7–4.0)
MCH: 31.9 pg (ref 26.0–34.0)
MCHC: 35.2 g/dL (ref 30.0–36.0)
MCV: 90.6 fL (ref 80.0–100.0)
Monocytes Absolute: 0.5 10*3/uL (ref 0.1–1.0)
Monocytes Relative: 9 %
Neutro Abs: 2.3 10*3/uL (ref 1.7–7.7)
Neutrophils Relative %: 44 %
Platelets: 26 10*3/uL — CL (ref 150–400)
RBC: 3.51 MIL/uL — ABNORMAL LOW (ref 4.22–5.81)
RDW: 15.5 % (ref 11.5–15.5)
WBC: 5.3 10*3/uL (ref 4.0–10.5)
nRBC: 1 % — ABNORMAL HIGH (ref 0.0–0.2)

## 2019-08-18 LAB — BASIC METABOLIC PANEL
Anion gap: 6 (ref 5–15)
BUN: 12 mg/dL (ref 8–23)
CO2: 22 mmol/L (ref 22–32)
Calcium: 7.2 mg/dL — ABNORMAL LOW (ref 8.9–10.3)
Chloride: 116 mmol/L — ABNORMAL HIGH (ref 98–111)
Creatinine, Ser: 0.58 mg/dL — ABNORMAL LOW (ref 0.61–1.24)
GFR calc Af Amer: >60 mL/min (ref >60)
GFR calc non Af Amer: >60 mL/min (ref >60)
Glucose, Bld: 190 mg/dL — ABNORMAL HIGH (ref 70–99)
Potassium: 3.5 mmol/L (ref 3.5–5.1)
Sodium: 144 mmol/L (ref 135–145)

## 2019-08-18 LAB — GLUCOSE, CAPILLARY
Glucose-Capillary: 120 mg/dL — ABNORMAL HIGH (ref 70–99)
Glucose-Capillary: 145 mg/dL — ABNORMAL HIGH (ref 70–99)
Glucose-Capillary: 164 mg/dL — ABNORMAL HIGH (ref 70–99)
Glucose-Capillary: 186 mg/dL — ABNORMAL HIGH (ref 70–99)
Glucose-Capillary: 27 mg/dL — CL (ref 70–99)
Glucose-Capillary: 294 mg/dL — ABNORMAL HIGH (ref 70–99)
Glucose-Capillary: 61 mg/dL — ABNORMAL LOW (ref 70–99)
Glucose-Capillary: 68 mg/dL — ABNORMAL LOW (ref 70–99)

## 2019-08-18 LAB — HEMOGLOBIN A1C
Hgb A1c MFr Bld: 15.3 % — ABNORMAL HIGH (ref 4.8–5.6)
Mean Plasma Glucose: 392 mg/dL

## 2019-08-18 MED ORDER — POTASSIUM CHLORIDE 10 MEQ/50ML IV SOLN
10.0000 meq | INTRAVENOUS | Status: AC
  Administered 2019-08-18 (×4): 10 meq via INTRAVENOUS
  Filled 2019-08-18 (×4): qty 50

## 2019-08-18 MED ORDER — INSULIN ASPART 100 UNIT/ML ~~LOC~~ SOLN
0.0000 [IU] | SUBCUTANEOUS | Status: DC
  Administered 2019-08-18 – 2019-08-19 (×2): 2 [IU] via SUBCUTANEOUS
  Administered 2019-08-19: 5 [IU] via SUBCUTANEOUS
  Administered 2019-08-19 (×2): 2 [IU] via SUBCUTANEOUS
  Administered 2019-08-19: 3 [IU] via SUBCUTANEOUS
  Administered 2019-08-19: 2 [IU] via SUBCUTANEOUS
  Administered 2019-08-20: 7 [IU] via SUBCUTANEOUS
  Administered 2019-08-20: 2 [IU] via SUBCUTANEOUS
  Administered 2019-08-20: 7 [IU] via SUBCUTANEOUS
  Administered 2019-08-20: 1 [IU] via SUBCUTANEOUS
  Administered 2019-08-20: 3 [IU] via SUBCUTANEOUS
  Administered 2019-08-21: 2 [IU] via SUBCUTANEOUS
  Administered 2019-08-21: 5 [IU] via SUBCUTANEOUS
  Administered 2019-08-21: 2 [IU] via SUBCUTANEOUS
  Administered 2019-08-21: 3 [IU] via SUBCUTANEOUS
  Administered 2019-08-22 (×5): 2 [IU] via SUBCUTANEOUS
  Administered 2019-08-23: 1 [IU] via SUBCUTANEOUS
  Administered 2019-08-23: 3 [IU] via SUBCUTANEOUS
  Administered 2019-08-23: 2 [IU] via SUBCUTANEOUS
  Administered 2019-08-23 (×2): 1 [IU] via SUBCUTANEOUS
  Administered 2019-08-23: 3 [IU] via SUBCUTANEOUS
  Administered 2019-08-24: 1 [IU] via SUBCUTANEOUS
  Administered 2019-08-24: 2 [IU] via SUBCUTANEOUS
  Administered 2019-08-24: 3 [IU] via SUBCUTANEOUS
  Administered 2019-08-24: 2 [IU] via SUBCUTANEOUS
  Administered 2019-08-24: 3 [IU] via SUBCUTANEOUS
  Administered 2019-08-24 – 2019-08-25 (×3): 2 [IU] via SUBCUTANEOUS
  Administered 2019-08-25: 3 [IU] via SUBCUTANEOUS
  Administered 2019-08-25: 5 [IU] via SUBCUTANEOUS
  Administered 2019-08-26: 7 [IU] via SUBCUTANEOUS
  Administered 2019-08-26 (×2): 5 [IU] via SUBCUTANEOUS
  Administered 2019-08-26 – 2019-08-27 (×3): 1 [IU] via SUBCUTANEOUS
  Administered 2019-08-27: 7 [IU] via SUBCUTANEOUS
  Administered 2019-08-27 – 2019-08-28 (×2): 2 [IU] via SUBCUTANEOUS
  Administered 2019-08-28: 7 [IU] via SUBCUTANEOUS
  Administered 2019-08-28: 3 [IU] via SUBCUTANEOUS
  Administered 2019-08-28: 2 [IU] via SUBCUTANEOUS

## 2019-08-18 MED ORDER — MAGNESIUM SULFATE 2 GM/50ML IV SOLN
2.0000 g | Freq: Once | INTRAVENOUS | Status: AC
  Administered 2019-08-18: 2 g via INTRAVENOUS
  Filled 2019-08-18: qty 50

## 2019-08-18 MED ORDER — PANTOPRAZOLE SODIUM 40 MG PO PACK
40.0000 mg | PACK | ORAL | Status: DC
  Administered 2019-08-18 – 2019-08-24 (×7): 40 mg
  Filled 2019-08-18 (×7): qty 20

## 2019-08-18 MED ORDER — FAMOTIDINE 20 MG PO TABS
20.0000 mg | ORAL_TABLET | Freq: Every day | ORAL | Status: DC
  Filled 2019-08-18: qty 1

## 2019-08-18 MED ORDER — VITAL 1.5 CAL PO LIQD
1000.0000 mL | ORAL | Status: DC
  Administered 2019-08-18 – 2019-08-23 (×7): 1000 mL
  Filled 2019-08-18 (×9): qty 1000

## 2019-08-18 MED ORDER — DEXTROSE 10 % IV SOLN: INTRAVENOUS | Status: DC

## 2019-08-18 MED ORDER — NOREPINEPHRINE 16 MG/250ML-% IV SOLN
0.0000 ug/min | INTRAVENOUS | Status: DC
  Administered 2019-08-18: 10 ug/min via INTRAVENOUS
  Administered 2019-08-18: 12 ug/min via INTRAVENOUS
  Filled 2019-08-18 (×2): qty 250

## 2019-08-18 MED ORDER — PANCRELIPASE (LIP-PROT-AMYL) 36000-114000 UNITS PO CPEP
36000.0000 [IU] | ORAL_CAPSULE | Freq: Three times a day (TID) | ORAL | Status: DC
  Administered 2019-08-18 – 2019-09-03 (×47): 36000 [IU] via ORAL
  Filled 2019-08-18 (×16): qty 1
  Filled 2019-08-18: qty 3
  Filled 2019-08-18 (×3): qty 1
  Filled 2019-08-18 (×2): qty 3
  Filled 2019-08-18 (×7): qty 1
  Filled 2019-08-18 (×2): qty 3
  Filled 2019-08-18 (×19): qty 1
  Filled 2019-08-18: qty 3
  Filled 2019-08-18 (×2): qty 1

## 2019-08-18 MED ORDER — DEXTROSE 50 % IV SOLN
1.0000 | Freq: Once | INTRAVENOUS | Status: AC
  Administered 2019-08-18: 50 mL via INTRAVENOUS

## 2019-08-18 NOTE — Progress Notes (Signed)
eLink Physician-Brief Progress Note Patient Name: Michael [name] DOB: [birth year] MRN: [id number]   Date of Service  08/18/2019  HPI/Events of Note  Frequent watery stools - Request for Flexiseal.   eICU Interventions  Plan: 1. Place Flexiseal.     Intervention Category Major Interventions: Other:  Lysle Dingwall 08/18/2019, 8:52 PM

## 2019-08-18 NOTE — Progress Notes (Signed)
Inpatient Diabetes Program Recommendations  AACE/ADA: New Consensus Statement on Inpatient Glycemic Control (2015)  Target Ranges:  Prepandial:   less than 140 mg/dL      Peak postprandial:   less than 180 mg/dL (1-2 hours)      Critically ill patients:  140 - 180 mg/dL   Lab Results  Component Value Date   GLUCAP 61 (L) 08/18/2019   HGBA1C 14.8 (H) 03/11/2019    Review of Glycemic Control Results for Michael [name], Michael [name] (MRN [id number]) as of 08/18/2019 11:03  Ref. Range 08/18/2019 03:47 08/18/2019 08:28 08/18/2019 08:30 08/18/2019 09:09  Glucose-Capillary Latest Ref Range: 70 - 99 mg/dL 164 (H) 27 (LL) 19 (LL) 61 (L)   Diabetes history: DM 2 Outpatient Diabetes medications:  Novolog 10 units tid with meals, Levemir 16 units bid Current orders for Inpatient glycemic control:  Novolog moderate q 4 hours  Inpatient Diabetes Program Recommendations:   Note hypoglycemia. Please consider reducing Novolog correction to very sensitive (0-6 units) q 4 hours.   Thanks,  Adah Perl, RN, BC-ADM Inpatient Diabetes Coordinator Pager [phone] (8a-5p)

## 2019-08-18 NOTE — Progress Notes (Signed)
Initial CBG 19, MD notified - IVF changed to D10 & Amp Dextrose given; recheck CBG 61, MD notified; no new orders.

## 2019-08-18 NOTE — Plan of Care (Signed)

## 2019-08-18 NOTE — Progress Notes (Signed)
NAME:  Michael [name], MRN:  [id number], DOB:  1955/01/24, LOS: 4 ADMISSION DATE:  07/24/2019, CONSULTATION DATE: 08/21/2019 REFERRING MD: EDP, CHIEF COMPLAINT: Altered mental status in the setting of hyperglycemia suspected pneumonia complicated by poor nutritional status and failure to thrive.  Brief History   64 year old with history of noncompliance insulin-dependent diabetes mellitus who presents 08/16/2019 with seizure activity,  altered mental status, hyperglycemia, and hypokalemic.  Pulmonary critical care asked to admit  Past Medical History   Past Medical History:  Diagnosis Date  . Anxiety   . Arthritis    "joints; shoulders; feet" (06/08/2016)  . Bipolar disorder ([place])   . Daily headache    "7:30 - 8:00 q night" (06/08/2016)  . Diabetic peripheral neuropathy ([place])   . GERD (gastroesophageal reflux disease)   . History of hepatitis B virus infection conferring immunity 03/22/2018  . Pancreatitis   . Schizophrenia ([place])   . Seizures ([place]) 01/2016 X 2  . Type II diabetes mellitus ([place])      Significant Hospital Events   7/22 >Presented to the emergency department altered mental status glucose greater than 1400 and noted to have grand mal seizure prior to admission.  Intubated for worsening hypoxia and respiratory failure, developed progressive hypovolemic shock required significant volume resuscitation in central venous access with pressors  7/23 > Off pressors.  Chest x-ray improved.  Weaning ventilator support, glycemic control improved, profoundly hypernatremic with multiple metabolic derangements.    7/25 > Developed respiratory distress with hypoxia overnight resulting in reintubation by night team; changed ABx with concern about medication induced thrombocytopenia  Consults:  07/28/2019 neurology s/o 7/24  Procedures:  A-line 7/25 ETT 7/25  Significant Diagnostic Tests:    Micro Data:  08/17/2019 blood culture x2>> 08/13/2019 urine culture>> 08/01/2019  Legionella>> 08/15/2019 COVID-19 testing>> negative  Antimicrobials:  08/13/2019 vancomycin>> 7/23 08/16/2019 cefepime>> 7/25 08/17/2019 rocephin >>   Interim history/subjective:  Remains on vent, sedation.  Objective   Blood pressure (!) 124/55, pulse 84, temperature (!) 100.6 F (38.1 C), temperature source Oral, resp. rate 14, height 5\' 4"  (1.626 m), weight (!) 34 kg, SpO2 100 %.    Vent Mode: PSV;CPAP FiO2 (%):  [40 %-100 %] 40 % Set Rate:  [16 bmp] 16 bmp Vt Set:  [470 mL] 470 mL PEEP:  [5 cmH20] 5 cmH20 Pressure Support:  [8 cmH20] 8 cmH20 Plateau Pressure:  [20 NWG95-62 cmH20] 20 cmH20   Intake/Output Summary (Last 24 hours) at 08/18/2019 1308 Last data filed at 08/18/2019 0700 Gross per 24 hour  Intake 4010.91 ml  Output 3880 ml  Net 130.91 ml   Filed Weights   08/16/19 0500 08/17/19 0251 08/18/19 0312  Weight: 54.5 kg 56 kg (!) 34 kg    Examination:  General - sedated Eyes - pupils pinpoint ENT - ETT in place Cardiac - regular rate/rhythm, no murmur Chest - b/l crackles Abdomen - soft, non tender, + bowel sounds Extremities - decreased muscle bulk Skin - no rashes Neuro - RASS -2   Resolved Hospital Problem list   Acute anion gap metabolic acidosis -In the setting of lactic acidosis: Anion gap now closed, Acid-base improved Severe hypernatremia Hypokalemia Post resuscitation hyperchloremia  Assessment & Plan:   Acute hypoxic respiratory failure from aspiration pneumonia. - goal SpO2 > 90% - f/u CXR - continue ABx  Acute metabolic encephalopathy 2nd to hypoxia. History of seizures with recurrent seizure in setting of hyperglycemia. Hx of Bipolar, schizophrenia. - RASS goal 0 to -1  Shock. -  from sepsis, hypovolemia - continue IV fluids - wean pressors to keep MAP > 65  Thrombocytopenia.  - possibly from medication (?cefepime, pepcid) - ABx changed 7/25 - f/u CBC  Chronic pancreatitis. - creon  Hypoglycemia. - change to D10 at 50  ml/hr  Best practice:  Diet: tube feeds DVT prophylaxis: SCDs GI prophylaxis: protonix Mobility: Bedrest Code Status: Full Disposition: ICU  Labs    CMP Latest Ref Rng & Units 08/18/2019 08/17/2019 08/17/2019  Glucose 70 - 99 mg/dL 190(H) 233(H) -  BUN 8 - 23 mg/dL 12 12 -  Creatinine 0.61 - 1.24 mg/dL 0.58(L) 0.50(L) -  Sodium 135 - 145 mmol/L 144 143 149(H)  Potassium 3.5 - 5.1 mmol/L 3.5 3.6 3.7  Chloride 98 - 111 mmol/L 116(H) 119(H) -  CO2 22 - 32 mmol/L 22 20(L) -  Calcium 8.9 - 10.3 mg/dL 7.2(L) 7.0(L) -  Total Protein 6.5 - 8.1 g/dL - - -  Total Bilirubin 0.3 - 1.2 mg/dL - - -  Alkaline Phos 38 - 126 U/L - - -  AST 15 - 41 U/L - - -  ALT 0 - 44 U/L - - -    CBC Latest Ref Rng & Units 08/18/2019 08/17/2019 08/17/2019  WBC 4.0 - 10.5 K/uL 5.3 7.2 -  Hemoglobin 13.0 - 17.0 g/dL 11.2(L) 10.8(L) 11.9(L)  Hematocrit 39 - 52 % 31.8(L) 30.8(L) 35.0(L)  Platelets 150 - 400 K/uL 26(LL) 26(LL) -    ABG    Component Value Date/Time   PHART 7.314 (L) 08/17/2019 0641   PCO2ART 41.2 08/17/2019 0641   PO2ART 214 (H) 08/17/2019 0641   HCO3 20.9 08/17/2019 0641   TCO2 22 08/17/2019 0641   ACIDBASEDEF 5.0 (H) 08/17/2019 0641   O2SAT 100.0 08/17/2019 0641    CBG (last 3)  Recent Labs    08/18/19 0347 08/18/19 0828 08/18/19 0830  GLUCAP 164* 27* 19*    Critical care time: 35 minutes  Chesley Mires, MD [place] Pager - [phone] 08/18/2019, 8:38 AM

## 2019-08-18 NOTE — Progress Notes (Signed)
Nutrition Follow-up  DOCUMENTATION CODES:   Severe malnutrition in context of chronic illness  INTERVENTION:   Advance to goal tube feeding regimen: - Vital 1.5 @ 45 ml/hr (1080 ml/day) via NG tube - ProSource TF 45 ml daily - Free water per MD, currently 100 ml q 8 hours  Tube feeding regimen provides 1640 kcal, 84 grams of protein, and 825 ml of H2O.  Current free water flushes provide an additional 300 ml daily.  NUTRITION DIAGNOSIS:   Severe Malnutrition related to chronic illness (poorly controlled T2DM, pancreatitis) as evidenced by moderate fat depletion, moderate muscle depletion, severe muscle depletion, percent weight loss (10.4% weight loss in 4 months).  Ongoing  GOAL:   Provide needs based on ASPEN/SCCM guidelines  Met via TF at goal  MONITOR:   Vent status, Labs, Weight trends, TF tolerance  REASON FOR ASSESSMENT:   Ventilator    ASSESSMENT:   64 year old male who presented to the ED on 7/22 with respiratory distress after being found unresponsive on the floor by family. Pt had multiple seizures in the field. PMH of bipolar disorder, T2DM (poorly compliant with insulin regimen), GERD, pancreatitis, schizophrenia, seizures. Pt required intubation for airway protection.  7/23 - trickle TF initiated, extubated 7/25 - intubated  Discussed pt with CCM. Able to advance tube feeds to goal. Will place orders. Discussed plan with RN.  NG tube in place with tube feeding infusing at 20 ml/hr.  EDW: 49.5 kg  Suspect weight of 34 kg today is in error given yesterday's weight was 56 kg.  Patient is currently intubated on ventilator support MV: 9.1 L/min Temp (24hrs), Avg:99.3 F (37.4 C), Min:98.2 F (36.8 C), Max:100.6 F (38.1 C) BP (a-line): 124/55 MAP (a-line): 72  Drips: Fentanyl: 5 ml/hr D10: 50 ml/hr Levophed: 11.3 ml/hr  Medications reviewed and include: colace, SSI q 4 hours, Creon, protonix, miralax, IV abx, IV KCl 10 mEq x 4 runs  Labs  reviewed: magnesium 1.6, platelets 26 CBG's: 19-164 x 24 hours  UOP: 3880 ml x 24 hours I/O's: +9.1 L since admit  Diet Order:   Diet Order            Diet NPO time specified  Diet effective now                 EDUCATION NEEDS:   No education needs have been identified at this time  Skin:  Skin Assessment: Reviewed RN Assessment  Last BM:  08/18/19 type 6  Height:   Ht Readings from Last 1 Encounters:  08/13/2019 _0  (1.626 m)    Weight:   Wt Readings from Last 1 Encounters:  08/18/19 (!) 34 kg    Ideal Body Weight:  59.1 kg  BMI:  Body mass index is 12.87 kg/m.  Estimated Nutritional Needs:   Kcal:  1600  Protein:  75-90 grams  Fluid:  >/= 1.6 L    Gaynell Face, MS, RD, LDN Inpatient Clinical Dietitian Please see AMiON for contact information.

## 2019-08-18 NOTE — Progress Notes (Signed)
[place] Progress Note Patient Name: Michael [name] DOB: 24-Dec-1955 MRN: [id number]   Date of Service  08/18/2019  HPI/Events of Note  Multiple issues: 1. Hypomagnesemia - MG++ = 1.6 and 2. Hypokalemia - K+ = 3.5 and Creatinine = 0.58.   eICU Interventions  Will replace Mg++ and K+.      Intervention Category Major Interventions: Electrolyte abnormality - evaluation and management  Dolly Harbach Eugene 08/18/2019, 5:35 AM

## 2019-08-18 NOTE — Progress Notes (Signed)
[place] Progress Note Patient Name: Michael [name] DOB: 09-21-1955 MRN: [id number]   Date of Service  08/18/2019  HPI/Events of Note  Nursing request for AM lab orders.   eICU Interventions  Plan: 1. CBC with platelets, BMP and Mg++ level in AM.      Intervention Category Major Interventions: Other:  Wilhelmine Krogstad Cornelia Copa 08/18/2019, 2:42 AM

## 2019-08-18 NOTE — Progress Notes (Signed)
Assisted tele visit to patient with family member.  Thomas, Knight Oelkers Renee, RN   

## 2019-08-18 NOTE — Progress Notes (Signed)
Assisted family with camera/video time via elink 

## 2019-08-19 ENCOUNTER — Inpatient Hospital Stay (HOSPITAL_COMMUNITY): Payer: Medicare HMO

## 2019-08-19 LAB — CULTURE, BLOOD (ROUTINE X 2)
Culture: NO GROWTH
Culture: NO GROWTH
Special Requests: ADEQUATE

## 2019-08-19 LAB — CBC
HCT: 31.2 % — ABNORMAL LOW (ref 39.0–52.0)
Hemoglobin: 10.6 g/dL — ABNORMAL LOW (ref 13.0–17.0)
MCH: 31.3 pg (ref 26.0–34.0)
MCHC: 34 g/dL (ref 30.0–36.0)
MCV: 92 fL (ref 80.0–100.0)
Platelets: 36 10*3/uL — ABNORMAL LOW (ref 150–400)
RBC: 3.39 MIL/uL — ABNORMAL LOW (ref 4.22–5.81)
RDW: 15.9 % — ABNORMAL HIGH (ref 11.5–15.5)
WBC: 6.7 10*3/uL (ref 4.0–10.5)
nRBC: 0.6 % — ABNORMAL HIGH (ref 0.0–0.2)

## 2019-08-19 LAB — GLUCOSE, CAPILLARY
Glucose-Capillary: >600 mg/dL (ref 70–99)
Glucose-Capillary: 19 mg/dL — CL (ref 70–99)
Glucose-Capillary: 225 mg/dL — ABNORMAL HIGH (ref 70–99)
Glucose-Capillary: 188 mg/dL — ABNORMAL HIGH (ref 70–99)
Glucose-Capillary: 188 mg/dL — ABNORMAL HIGH (ref 70–99)
Glucose-Capillary: 177 mg/dL — ABNORMAL HIGH (ref 70–99)
Glucose-Capillary: 166 mg/dL — ABNORMAL HIGH (ref 70–99)

## 2019-08-19 LAB — COMPREHENSIVE METABOLIC PANEL
ALT: 107 U/L — ABNORMAL HIGH (ref 0–44)
AST: 94 U/L — ABNORMAL HIGH (ref 15–41)
Albumin: 1.4 g/dL — ABNORMAL LOW (ref 3.5–5.0)
Alkaline Phosphatase: 542 U/L — ABNORMAL HIGH (ref 38–126)
Anion gap: 5 (ref 5–15)
BUN: 11 mg/dL (ref 8–23)
CO2: 22 mmol/L (ref 22–32)
Calcium: 7.5 mg/dL — ABNORMAL LOW (ref 8.9–10.3)
Chloride: 118 mmol/L — ABNORMAL HIGH (ref 98–111)
Creatinine, Ser: 0.65 mg/dL (ref 0.61–1.24)
GFR calc Af Amer: >60 mL/min (ref >60)
GFR calc non Af Amer: >60 mL/min (ref >60)
Glucose, Bld: 301 mg/dL — ABNORMAL HIGH (ref 70–99)
Potassium: 4.1 mmol/L (ref 3.5–5.1)
Sodium: 145 mmol/L (ref 135–145)
Total Bilirubin: 1.2 mg/dL (ref 0.3–1.2)
Total Protein: 3.8 g/dL — ABNORMAL LOW (ref 6.5–8.1)

## 2019-08-19 LAB — MAGNESIUM: Magnesium: 2 mg/dL (ref 1.7–2.4)

## 2019-08-19 LAB — PHOSPHORUS: Phosphorus: 2.6 mg/dL (ref 2.5–4.6)

## 2019-08-19 MED ORDER — POLYETHYLENE GLYCOL 3350 17 G PO PACK: 17.0000 g | PACK | Freq: Every day | ORAL | Status: DC | PRN

## 2019-08-19 MED ORDER — AMIODARONE LOAD VIA INFUSION
150.0000 mg | Freq: Once | INTRAVENOUS | Status: AC
  Administered 2019-08-19: 150 mg via INTRAVENOUS
  Filled 2019-08-19: qty 83.34

## 2019-08-19 MED ORDER — AMIODARONE HCL IN DEXTROSE 360-4.14 MG/200ML-% IV SOLN
INTRAVENOUS | Status: AC
  Administered 2019-08-19: 60 mg/h via INTRAVENOUS
  Filled 2019-08-19: qty 200

## 2019-08-19 MED ORDER — AMIODARONE HCL IN DEXTROSE 360-4.14 MG/200ML-% IV SOLN
30.0000 mg/h | INTRAVENOUS | Status: AC
  Administered 2019-08-19 – 2019-08-21 (×5): 30 mg/h via INTRAVENOUS
  Filled 2019-08-19 (×6): qty 200

## 2019-08-19 MED ORDER — LEVETIRACETAM 100 MG/ML PO SOLN
500.0000 mg | Freq: Two times a day (BID) | ORAL | Status: DC
  Administered 2019-08-19 – 2019-08-24 (×11): 500 mg
  Filled 2019-08-19 (×11): qty 10

## 2019-08-19 MED ORDER — AMIODARONE HCL IN DEXTROSE 360-4.14 MG/200ML-% IV SOLN
60.0000 mg/h | INTRAVENOUS | Status: AC
  Administered 2019-08-19: 60 mg/h via INTRAVENOUS
  Filled 2019-08-19: qty 200

## 2019-08-19 MED ORDER — PHENYLEPHRINE CONCENTRATED 100MG/250ML (0.4 MG/ML) INFUSION SIMPLE
0.0000 ug/min | INTRAVENOUS | Status: DC
  Administered 2019-08-19: 80 ug/min via INTRAVENOUS
  Administered 2019-08-20: 120 ug/min via INTRAVENOUS
  Filled 2019-08-19 (×3): qty 250

## 2019-08-19 MED ORDER — ALBUTEROL SULFATE (2.5 MG/3ML) 0.083% IN NEBU
2.5000 mg | INHALATION_SOLUTION | RESPIRATORY_TRACT | Status: DC | PRN
  Administered 2019-08-21 – 2019-08-25 (×4): 2.5 mg via RESPIRATORY_TRACT
  Filled 2019-08-19 (×4): qty 3

## 2019-08-19 MED ORDER — LEVETIRACETAM IN NACL 500 MG/100ML IV SOLN
500.0000 mg | Freq: Once | INTRAVENOUS | Status: AC
  Administered 2019-08-19: 500 mg via INTRAVENOUS

## 2019-08-19 MED ORDER — LEVETIRACETAM 100 MG/ML PO SOLN: 500.0000 mg | Freq: Two times a day (BID) | ORAL | Status: DC

## 2019-08-19 NOTE — Progress Notes (Signed)
Assisted tele visit to patient with family member.  Ravinder Hofland Anderson, RN   

## 2019-08-19 NOTE — Progress Notes (Signed)
[place] Progress Note Patient Name: Michael [name] DOB: August 07, 1955 MRN: [id number]   Date of Service  08/19/2019  HPI/Events of Note  Nursing reports respiratory rhochi.   eICU Interventions  PlanL 1. Albuterol 2.5 mg via neb Q 3 hours PRN wheezing and SOB.     Intervention Category Major Interventions: Other:  Kamyra Schroeck Cornelia Copa 08/19/2019, 1:28 AM

## 2019-08-19 NOTE — Progress Notes (Signed)
NAME:  Michael [name], MRN:  [id number], DOB:  1956/01/11, LOS: 5 ADMISSION DATE:  08/12/2019, CONSULTATION DATE: 07/29/2019 REFERRING MD: EDP, CHIEF COMPLAINT: Altered mental status in the setting of hyperglycemia suspected pneumonia complicated by poor nutritional status and failure to thrive.  Brief History   64 year old with history of noncompliance insulin-dependent diabetes mellitus who presents 08/22/2019 with seizure activity,  altered mental status, hyperglycemia, and hypokalemic.  Pulmonary critical care asked to admit.  Past Medical History  Anxiety, OA, Bipolar, HA, DM type 2 with neuropathy, GERD, Hep B, Pancreatitis, Seizures  Significant Hospital Events   7/22 admit, AMS, blood sugar > 1400, seizure, aspiration, VDRF, pressors 7/23 off pressors 7/24 urology placed foley 7/25 reintubated due to worsening hypoxia 7/27 A fib with RVR  Consults:  7/22 neurology s/o 7/24 7/24 urology  Procedures:  Lt IJ CVL 7/22 >>  A-line 7/25 >>  ETT 7/25 >>   Significant Diagnostic Tests:   EEG 7/22 >> continuous slowing  CT head 7/22 >> atrophy, chronic small vessel ischemic changes, chronic thalamic lacunar infarcts  CT chest 7/22 >> multifocal pneumonia, changes of cirrhosis, small volume ascites, atherosclerosis  Micro Data:  07/29/2019 blood culture x2>> 08/06/2019 urine culture>> negative 08/13/2019 COVID-19 testing>> negative  Antimicrobials:  08/17/2019 vancomycin>> 7/23 08/22/2019 cefepime>> 7/25 08/17/2019 rocephin >>   Interim history/subjective:  Developed A fib overnight.    Objective   Blood pressure 98/69, pulse 105, temperature 98.7 F (37.1 C), temperature source Axillary, resp. rate 16, height 5\' 4"  (1.626 m), weight 58.2 kg, SpO2 100 %. CVP:  [10 mmHg] 10 mmHg  Vent Mode: PRVC FiO2 (%):  [40 %-55 %] 50 % Set Rate:  [15 bmp-16 bmp] 15 bmp Vt Set:  [470 mL] 470 mL PEEP:  [5 cmH20] 5 cmH20 Plateau Pressure:  [9 cmH20-22 cmH20] 9 cmH20   Intake/Output  Summary (Last 24 hours) at 08/19/2019 0818 Last data filed at 08/19/2019 3710 Gross per 24 hour  Intake 2992.1 ml  Output 1165 ml  Net 1827.1 ml   Filed Weights   08/17/19 0251 08/18/19 0312 08/19/19 0500  Weight: 56 kg (!) 34 kg 58.2 kg    Examination:  General - sedated Eyes - pupils reactive ENT - ETT in place Cardiac - irregular, tachycardic Chest - b/l crackles Abdomen - soft, non tender, + bowel sounds Extremities - 1+ edema Skin - no rashes Neuro - RASS -2   Resolved Hospital Problem list   Lactic acidosis, Hypernatremia, Hypokalemia  Assessment & Plan:   Acute hypoxic respiratory failure from aspiration pneumonia. - goal SpO2 > 90% - not ready for vent weaning at this time - day 6/7 of ABx, currently on rocephin  Acute metabolic encephalopathy 2nd to hypoxia. History of seizures with recurrent seizure in setting of hyperglycemia. Hx of Bipolar, schizophrenia, neuropathy. - RASS goal 0 to -1 - continue abilify, neurontin - continue keppra  Shock. - from sepsis, hypovolemia - pressors to keep MAP > 65; changed to phenylephrine on 7/27 due to tachycardia  A fib with RVR. - add amiodarone 7/27 - defer anticoagulation for now in setting of thrombocytopenia  Thrombocytopenia.  - possibly from medication (?cefepime, pepcid) - ABx changed 7/25 - improved some 7/27 - f/u CBC  Severe protein calorie malnutrition. Chronic pancreatitis. - continue creon  Urine retention. - foley placed by urology 7/24  Hypoglycemia. - improved - d/c D10 from IV fluid on 7/27   Best practice:  Diet: tube feeds DVT prophylaxis: SCDs GI prophylaxis: protonix  Mobility: Bedrest Code Status: Full Disposition: ICU  Labs    CMP Latest Ref Rng & Units 08/19/2019 08/18/2019 08/17/2019  Glucose 70 - 99 mg/dL 301(H) 190(H) 233(H)  BUN 8 - 23 mg/dL 11 12 12   Creatinine 0.61 - 1.24 mg/dL 0.65 0.58(L) 0.50(L)  Sodium 135 - 145 mmol/L 145 144 143  Potassium 3.5 - 5.1 mmol/L  4.1 3.5 3.6  Chloride 98 - 111 mmol/L 118(H) 116(H) 119(H)  CO2 22 - 32 mmol/L 22 22 20(L)  Calcium 8.9 - 10.3 mg/dL 7.5(L) 7.2(L) 7.0(L)  Total Protein 6.5 - 8.1 g/dL 3.8(L) - -  Total Bilirubin 0.3 - 1.2 mg/dL 1.2 - -  Alkaline Phos 38 - 126 U/L 542(H) - -  AST 15 - 41 U/L 94(H) - -  ALT 0 - 44 U/L 107(H) - -    CBC Latest Ref Rng & Units 08/19/2019 08/18/2019 08/17/2019  WBC 4.0 - 10.5 K/uL 6.7 5.3 7.2  Hemoglobin 13.0 - 17.0 g/dL 10.6(L) 11.2(L) 10.8(L)  Hematocrit 39 - 52 % 31.2(L) 31.8(L) 30.8(L)  Platelets 150 - 400 K/uL 36(L) 26(LL) 26(LL)    ABG    Component Value Date/Time   PHART 7.314 (L) 08/17/2019 0641   PCO2ART 41.2 08/17/2019 0641   PO2ART 214 (H) 08/17/2019 0641   HCO3 20.9 08/17/2019 0641   TCO2 22 08/17/2019 0641   ACIDBASEDEF 5.0 (H) 08/17/2019 0641   O2SAT 100.0 08/17/2019 0641    CBG (last 3)  Recent Labs    08/18/19 2338 08/19/19 0334 08/19/19 0757  GLUCAP 294* 225* 188*    Critical care time: 34 minutes  Chesley Mires, MD [place] Pager - [phone] 08/19/2019, 8:18 AM

## 2019-08-19 NOTE — Progress Notes (Addendum)
PT with decreased urine output over past 24hr. PT w/ 12 fr foley placed by urology on 7/24. Bladder scan showing >800 cc. Catheter flushed multiple times. Slow output of 900 cc over hour.  Bladder scan now showing 700 cc. Urology paged x2. CCM made aware

## 2019-08-20 ENCOUNTER — Inpatient Hospital Stay (HOSPITAL_COMMUNITY): Payer: Medicare HMO

## 2019-08-20 DIAGNOSIS — I4891 Unspecified atrial fibrillation: Secondary | ICD-10-CM

## 2019-08-20 DIAGNOSIS — J9600 Acute respiratory failure, unspecified whether with hypoxia or hypercapnia: Secondary | ICD-10-CM | POA: Diagnosis not present

## 2019-08-20 LAB — CBC
HCT: 30 % — ABNORMAL LOW (ref 39.0–52.0)
Hemoglobin: 10.3 g/dL — ABNORMAL LOW (ref 13.0–17.0)
MCH: 31.3 pg (ref 26.0–34.0)
MCHC: 34.3 g/dL (ref 30.0–36.0)
MCV: 91.2 fL (ref 80.0–100.0)
Platelets: 46 10*3/uL — ABNORMAL LOW (ref 150–400)
RBC: 3.29 MIL/uL — ABNORMAL LOW (ref 4.22–5.81)
RDW: 15.8 % — ABNORMAL HIGH (ref 11.5–15.5)
WBC: 11.4 10*3/uL — ABNORMAL HIGH (ref 4.0–10.5)
nRBC: 0.6 % — ABNORMAL HIGH (ref 0.0–0.2)

## 2019-08-20 LAB — GLUCOSE, CAPILLARY
Glucose-Capillary: 147 mg/dL — ABNORMAL HIGH (ref 70–99)
Glucose-Capillary: 197 mg/dL — ABNORMAL HIGH (ref 70–99)
Glucose-Capillary: 222 mg/dL — ABNORMAL HIGH (ref 70–99)
Glucose-Capillary: 308 mg/dL — ABNORMAL HIGH (ref 70–99)
Glucose-Capillary: 320 mg/dL — ABNORMAL HIGH (ref 70–99)
Glucose-Capillary: 52 mg/dL — ABNORMAL LOW (ref 70–99)
Glucose-Capillary: 66 mg/dL — ABNORMAL LOW (ref 70–99)
Glucose-Capillary: 87 mg/dL (ref 70–99)
Glucose-Capillary: 98 mg/dL (ref 70–99)

## 2019-08-20 LAB — ECHOCARDIOGRAM COMPLETE
Area-P 1/2: 5.66 cm2
Height: 64 in
S' Lateral: 1.9 cm
Weight: 2186.96 oz

## 2019-08-20 LAB — MAGNESIUM: Magnesium: 2 mg/dL (ref 1.7–2.4)

## 2019-08-20 LAB — BASIC METABOLIC PANEL
Anion gap: 6 (ref 5–15)
BUN: 9 mg/dL (ref 8–23)
CO2: 24 mmol/L (ref 22–32)
Calcium: 7.7 mg/dL — ABNORMAL LOW (ref 8.9–10.3)
Chloride: 115 mmol/L — ABNORMAL HIGH (ref 98–111)
Creatinine, Ser: 0.6 mg/dL — ABNORMAL LOW (ref 0.61–1.24)
GFR calc Af Amer: >60 mL/min (ref >60)
GFR calc non Af Amer: >60 mL/min (ref >60)
Glucose, Bld: 228 mg/dL — ABNORMAL HIGH (ref 70–99)
Potassium: 3.9 mmol/L (ref 3.5–5.1)
Sodium: 145 mmol/L (ref 135–145)

## 2019-08-20 MED ORDER — DEXTROSE 50 % IV SOLN
INTRAVENOUS | Status: AC
  Filled 2019-08-20: qty 50

## 2019-08-20 MED ORDER — DEXTROSE 5 % IV SOLN: INTRAVENOUS | Status: AC

## 2019-08-20 MED ORDER — DOCUSATE SODIUM 50 MG/5ML PO LIQD
100.0000 mg | Freq: Every day | ORAL | Status: DC | PRN
  Administered 2019-08-22: 100 mg

## 2019-08-20 MED ORDER — GABAPENTIN 250 MG/5ML PO SOLN
300.0000 mg | Freq: Two times a day (BID) | ORAL | Status: DC
  Administered 2019-08-20 – 2019-08-24 (×9): 300 mg
  Filled 2019-08-20 (×10): qty 6

## 2019-08-20 MED ORDER — FUROSEMIDE 10 MG/ML IJ SOLN
40.0000 mg | Freq: Once | INTRAMUSCULAR | Status: AC
  Administered 2019-08-20: 40 mg via INTRAVENOUS
  Filled 2019-08-20: qty 4

## 2019-08-20 MED ORDER — POLYETHYLENE GLYCOL 3350 17 G PO PACK: 17.0000 g | PACK | Freq: Every day | ORAL | Status: DC | PRN

## 2019-08-20 MED ORDER — DEXTROSE 50 % IV SOLN
25.0000 mL | Freq: Once | INTRAVENOUS | Status: AC
  Administered 2019-08-20: 25 mL via INTRAVENOUS

## 2019-08-20 MED ORDER — MIDAZOLAM HCL 2 MG/2ML IJ SOLN
2.0000 mg | INTRAMUSCULAR | Status: DC | PRN
  Administered 2019-08-21 – 2019-08-22 (×3): 2 mg via INTRAVENOUS
  Filled 2019-08-20 (×3): qty 2

## 2019-08-20 MED ORDER — INSULIN DETEMIR 100 UNIT/ML ~~LOC~~ SOLN
10.0000 [IU] | Freq: Every day | SUBCUTANEOUS | Status: DC
  Administered 2019-08-20: 10 [IU] via SUBCUTANEOUS
  Filled 2019-08-20 (×2): qty 0.1

## 2019-08-20 MED ORDER — FENTANYL CITRATE (PF) 100 MCG/2ML IJ SOLN
50.0000 ug | INTRAMUSCULAR | Status: DC | PRN
  Administered 2019-08-20 – 2019-08-23 (×11): 100 ug via INTRAVENOUS
  Administered 2019-08-23: 50 ug via INTRAVENOUS
  Administered 2019-08-23: 100 ug via INTRAVENOUS
  Filled 2019-08-20 (×13): qty 2

## 2019-08-20 MED ORDER — ARIPIPRAZOLE 2 MG PO TABS
1.0000 mg | ORAL_TABLET | Freq: Every day | ORAL | Status: DC
  Administered 2019-08-20 – 2019-08-24 (×5): 1 mg
  Filled 2019-08-20 (×5): qty 1

## 2019-08-20 NOTE — Progress Notes (Signed)
  Echocardiogram 2D Echocardiogram has been performed.  Michael [name] 08/20/2019, 9:50 AM

## 2019-08-20 NOTE — Progress Notes (Addendum)
eLink Physician-Brief Progress Note Patient Name: LUISANTONIO ADINOLFI DOB: 06-Sep-1955 MRN: [id number]   Date of Service  08/20/2019  HPI/Events of Note  RN called just to notify about hypoglycemic episode -BS was 66 pt intubated on tube feeding at goal>>on sliding scale sensistive every 4 hours and levemir 10 u daily>>was given d50 1/2 amp and rpt BS was 98.  Cr normal.  Lytes ok.   eICU Interventions  - hrly CBG. Call back if low again. - on TF at 46 ml/free water.      Intervention Category Minor Interventions: Other:  Elmer Sow 08/20/2019, 9:04 PM  RN  called  due to hypoglycemic apisode again  BS was 52 was given 1/2 amp of D50. do you want to start pt with dextrose fluid>>pt is still on cont tube feeding at goal. See elink note

## 2019-08-20 NOTE — Progress Notes (Signed)
NAME:  Michael [name], MRN:  [id number], DOB:  September 03, 1955, LOS: 6 ADMISSION DATE:  08/21/2019, CONSULTATION DATE: 08/23/2019 REFERRING MD: EDP, CHIEF COMPLAINT: Altered mental status in the setting of hyperglycemia suspected pneumonia complicated by poor nutritional status and failure to thrive.  Brief History   64 year old with history of noncompliance insulin-dependent diabetes mellitus who presents 08/06/2019 with seizure activity,  altered mental status, hyperglycemia, and hypokalemic.  Pulmonary critical care asked to admit.  Past Medical History  Anxiety, OA, Bipolar, HA, DM type 2 with neuropathy, GERD, Hep B, Pancreatitis, Seizures  Significant Hospital Events   7/22 admit, AMS, blood sugar > 1400, seizure, aspiration, VDRF, pressors 7/23 off pressors 7/24 urology placed foley 7/25 reintubated due to worsening hypoxia 7/27 A fib with RVR - add amiodarone  Consults:  7/22 neurology s/o 7/24 7/24 urology  Procedures:  Lt IJ CVL 7/22 >>  A-line 7/25 >>  ETT 7/25 >>   Significant Diagnostic Tests:   EEG 7/22 >> continuous slowing  CT head 7/22 >> atrophy, chronic small vessel ischemic changes, chronic thalamic lacunar infarcts  CT chest 7/22 >> multifocal pneumonia, changes of cirrhosis, small volume ascites, atherosclerosis  Echo 7/28 >>   Micro Data:  08/02/2019 blood culture x2>> 07/24/2019 urine culture>> negative 08/15/2019 COVID-19 testing>> negative  Antimicrobials:  08/02/2019 vancomycin>> 7/23 08/03/2019 cefepime>> 7/25 08/17/2019 rocephin >>   Interim history/subjective:  Remains on amiodarone, pressors.   Objective   Blood pressure (!) 111/49, pulse 97, temperature 98.5 F (36.9 C), resp. rate 22, height 5\' 4"  (1.626 m), weight 62 kg, SpO2 92 %.    Vent Mode: PRVC FiO2 (%):  [40 %-50 %] 40 % Set Rate:  [16 bmp] 16 bmp Vt Set:  [47 mL-470 mL] 470 mL PEEP:  [5 cmH20] 5 cmH20 Plateau Pressure:  [18 cmH20-22 cmH20] 18 cmH20   Intake/Output Summary (Last  24 hours) at 08/20/2019 2119 Last data filed at 08/20/2019 0600 Gross per 24 hour  Intake 2818.51 ml  Output 610 ml  Net 2208.51 ml   Filed Weights   08/18/19 0312 08/19/19 0500 08/20/19 0500  Weight: (!) 34 kg 58.2 kg 62 kg    Examination:  General - sedated Eyes - pupils reactive ENT - ETT in place Cardiac - regular, tachycardic Chest - b/l crackles Abdomen - soft, non tender, + bowel sounds Extremities - 1+ edema Skin - no rashes Neuro - not following commands GU - scrotal edema   Resolved Hospital Problem list   Lactic acidosis, Hypernatremia, Hypokalemia, Hypoglycemia  Assessment & Plan:   Acute hypoxic respiratory failure from aspiration pneumonia. - pressure support as able - mental status, respiratory secretions, edema barriers to extubation at this time - f/u CXR - day 7/7 of ABx - lasix 40 mg IV x one on 4/17  Acute metabolic encephalopathy 2nd to hypoxia. History of seizures with recurrent seizure in setting of hyperglycemia. Hx of Bipolar, schizophrenia, neuropathy. - RASS goal 0 to -1 - change to prn versed, fentanyl - change abilify to 1 mg qhs - change gabapentin to 300 mg q12h - continue keppra - if mental status doesn't improve further, then will need additional neuro testing  Shock. - from sepsis, hypovolemia - goal MAP > 65  A fib with RVR on 7/27. - back in sinus rhythm 7/28 - continue IV amiodarone for now - f/u Echo  Thrombocytopenia.  - possibly from medication (?cefepime, pepcid) - ABx changed 7/25 - improving - f/u CBC  Severe protein calorie malnutrition.  Chronic pancreatitis. - continue creon  Urine retention. - foley placed by urology 7/24  DM type 2 poorly controlled with hyperglycemia. - SSI - add levemir 10 units daily   Best practice:  Diet: tube feeds DVT prophylaxis: SCDs GI prophylaxis: protonix Mobility: Bedrest Code Status: Full Disposition: ICU  Labs    CMP Latest Ref Rng & Units 08/20/2019  08/19/2019 08/18/2019  Glucose 70 - 99 mg/dL 228(H) 301(H) 190(H)  BUN 8 - 23 mg/dL 9 11 12   Creatinine 0.61 - 1.24 mg/dL 0.60(L) 0.65 0.58(L)  Sodium 135 - 145 mmol/L 145 145 144  Potassium 3.5 - 5.1 mmol/L 3.9 4.1 3.5  Chloride 98 - 111 mmol/L 115(H) 118(H) 116(H)  CO2 22 - 32 mmol/L 24 22 22   Calcium 8.9 - 10.3 mg/dL 7.7(L) 7.5(L) 7.2(L)  Total Protein 6.5 - 8.1 g/dL - 3.8(L) -  Total Bilirubin 0.3 - 1.2 mg/dL - 1.2 -  Alkaline Phos 38 - 126 U/L - 542(H) -  AST 15 - 41 U/L - 94(H) -  ALT 0 - 44 U/L - 107(H) -    CBC Latest Ref Rng & Units 08/20/2019 08/19/2019 08/18/2019  WBC 4.0 - 10.5 K/uL 11.4(H) 6.7 5.3  Hemoglobin 13.0 - 17.0 g/dL 10.3(L) 10.6(L) 11.2(L)  Hematocrit 39 - 52 % 30.0(L) 31.2(L) 31.8(L)  Platelets 150 - 400 K/uL 46(L) 36(L) 26(LL)    ABG    Component Value Date/Time   PHART 7.314 (L) 08/17/2019 0641   PCO2ART 41.2 08/17/2019 0641   PO2ART 214 (H) 08/17/2019 0641   HCO3 20.9 08/17/2019 0641   TCO2 22 08/17/2019 0641   ACIDBASEDEF 5.0 (H) 08/17/2019 0641   O2SAT 100.0 08/17/2019 0641    CBG (last 3)  Recent Labs    08/20/19 0040 08/20/19 0427 08/20/19 0807  GLUCAP 147* 222* 320*    Critical care time: 33 minutes  Chesley Mires, MD [place] Pager - [phone] 08/20/2019, 8:33 AM

## 2019-08-20 NOTE — Progress Notes (Signed)
eLink Physician-Brief Progress Note Patient Name: HOLDEN MANISCALCO DOB: 1955-02-03 MRN: [id number]   Date of Service  08/20/2019  HPI/Events of Note  RN  called  due to hypoglycemic apisode again  BS was 52 was given 1/2 amp of D50. do you want to start pt with dextrose fluid>>pt is still on cont tube feeding at goal.  eICU Interventions  Start Dextrose water . BG q2 hr x 4.      Intervention Category Intermediate Interventions: Other:  Elmer Sow 08/20/2019, 11:35 PM

## 2019-08-20 NOTE — Progress Notes (Signed)
Hypoglycemic Event  CBG: 66  Treatment: D50 25 mL (12.5 gm)  Symptoms: None  Follow-up CBG: Time:2055 CBG Result:98  Possible Reasons for Event: Inadequate meal intake and Medication regimen: tube feeding Vital 1.5 at 45   Comments/MD notified:On call Elink     Martinique R Shedrick Sarli

## 2019-08-20 NOTE — Progress Notes (Signed)
Urology Progress Note  Subjective: Urology contacted for concerns regarding leakage around catheter and elevated bladder scan (300cc). Patient received lasix today for diuresis.  Flushed catheter at bedside with 60cc sterile water. Given small lumen, slow return of urine, but urine clear yellow without debris. Flows through catheter without difficulty. Patient notable for significant edema of scrotum and foreskin.    Physical Exam:  Vital signs in last 24 hours: Temp:  [98.5 F (36.9 C)-100.5 F (38.1 C)] 98.5 F (36.9 C) (07/28 1313) Pulse Rate:  [79-137] 96 (07/28 1500) Resp:  [13-29] 14 (07/28 1500) BP: (83-147)/(49-83) 131/75 (07/28 1400) SpO2:  [86 %-98 %] 98 % (07/28 1500) Arterial Line BP: (90-181)/(47-78) 90/47 (07/28 1500) FiO2 (%):  [40 %-50 %] 50 % (07/28 1217) Weight:  [62 kg] 62 kg (07/28 0500) Constitutional:  Lying in bed, intubated, sedated GU: Phimosis with increasingly edematous foreskin, unable to retract. Urine clear yellow in tubing. Able to flush catheter, though slow return of urine likely secondary to small lumen (12Fr)  Laboratory Data:  Recent Labs    08/18/19 0324 08/19/19 0353 08/20/19 0230  WBC 5.3 6.7 11.4*  HGB 11.2* 10.6* 10.3*  HCT 31.8* 31.2* 30.0*  PLT 26* 36* 46*    Recent Labs    08/18/19 0324 08/19/19 0353 08/20/19 0230  NA 144 145 145  K 3.5 4.1 3.9  CL 116* 118* 115*  GLUCOSE 190* 301* 228*  BUN 12 11 9   CALCIUM 7.2* 7.5* 7.7*  CREATININE 0.58* 0.65 0.60*   Estimated Creatinine Clearance: 81.2 mL/min (A) (by C-G formula based on SCr of 0.6 mg/dL (L)).   Impression/Recommendation 64 y.o. male with phimosis and urinary retention in the setting of altered mental status.  1. Do NOT remove catheter while patient has persistent foreskin edema and while remains intubated. Continue catheter until patient alert/ambulatory, as catheter would be very hard to replace 2. Recommend scrotal elevation to help improve dependent edema of  penis and scrotum 3. Given smaller lumen size of catheter, leakage around catheter is to be expected especially in the setting of diuresis, as flow of urine is relatively slow. Bladder scans may not be reliable n the setting of generalized edema as well. Catheter is patent at this time and does not need to be exchanged. Ok to flush current catheter with 60-120cc sterile water/saline to confirm patency.    Carmie Kanner 08/20/2019, 3:57 PM

## 2019-08-20 NOTE — Progress Notes (Signed)
Hypoglycemic Event  CBG: 52  Treatment: D50 25 mL (12.5 gm)  Symptoms: None  Follow-up CBG: Time:0010 CBG Result:76  Possible Reasons for Event: Inadequate meal intake  Comments/MD notified:Elink on call  New orders for D5 at 87ml per hr given per [facility]    Martinique R Stonewall Doss

## 2019-08-21 ENCOUNTER — Inpatient Hospital Stay (HOSPITAL_COMMUNITY): Payer: Medicare HMO

## 2019-08-21 DIAGNOSIS — J9600 Acute respiratory failure, unspecified whether with hypoxia or hypercapnia: Secondary | ICD-10-CM | POA: Diagnosis not present

## 2019-08-21 LAB — CBC
HCT: 27.4 % — ABNORMAL LOW (ref 39.0–52.0)
Hemoglobin: 9.5 g/dL — ABNORMAL LOW (ref 13.0–17.0)
MCH: 31.7 pg (ref 26.0–34.0)
MCHC: 34.7 g/dL (ref 30.0–36.0)
MCV: 91.3 fL (ref 80.0–100.0)
Platelets: 51 10*3/uL — ABNORMAL LOW (ref 150–400)
RBC: 3 MIL/uL — ABNORMAL LOW (ref 4.22–5.81)
RDW: 15.9 % — ABNORMAL HIGH (ref 11.5–15.5)
WBC: 13.3 10*3/uL — ABNORMAL HIGH (ref 4.0–10.5)
nRBC: 0.4 % — ABNORMAL HIGH (ref 0.0–0.2)

## 2019-08-21 LAB — POCT I-STAT 7, (LYTES, BLD GAS, ICA,H+H)
Acid-Base Excess: 1 mmol/L (ref 0.0–2.0)
Bicarbonate: 24.4 mmol/L (ref 20.0–28.0)
Calcium, Ion: 1.19 mmol/L (ref 1.15–1.40)
HCT: 27 % — ABNORMAL LOW (ref 39.0–52.0)
Hemoglobin: 9.2 g/dL — ABNORMAL LOW (ref 13.0–17.0)
O2 Saturation: 98 %
Patient temperature: 99
Potassium: 3.8 mmol/L (ref 3.5–5.1)
Sodium: 147 mmol/L — ABNORMAL HIGH (ref 135–145)
TCO2: 25 mmol/L (ref 22–32)
pCO2 arterial: 33.7 mmHg (ref 32.0–48.0)
pH, Arterial: 7.469 — ABNORMAL HIGH (ref 7.350–7.450)
pO2, Arterial: 98 mmHg (ref 83.0–108.0)

## 2019-08-21 LAB — BASIC METABOLIC PANEL
Anion gap: 8 (ref 5–15)
BUN: 10 mg/dL (ref 8–23)
CO2: 26 mmol/L (ref 22–32)
Calcium: 7.6 mg/dL — ABNORMAL LOW (ref 8.9–10.3)
Chloride: 112 mmol/L — ABNORMAL HIGH (ref 98–111)
Creatinine, Ser: 0.58 mg/dL — ABNORMAL LOW (ref 0.61–1.24)
GFR calc Af Amer: >60 mL/min (ref >60)
GFR calc non Af Amer: >60 mL/min (ref >60)
Glucose, Bld: 156 mg/dL — ABNORMAL HIGH (ref 70–99)
Potassium: 3.2 mmol/L — ABNORMAL LOW (ref 3.5–5.1)
Sodium: 146 mmol/L — ABNORMAL HIGH (ref 135–145)

## 2019-08-21 LAB — GLUCOSE, CAPILLARY
Glucose-Capillary: 103 mg/dL — ABNORMAL HIGH (ref 70–99)
Glucose-Capillary: 110 mg/dL — ABNORMAL HIGH (ref 70–99)
Glucose-Capillary: 181 mg/dL — ABNORMAL HIGH (ref 70–99)
Glucose-Capillary: 181 mg/dL — ABNORMAL HIGH (ref 70–99)
Glucose-Capillary: 193 mg/dL — ABNORMAL HIGH (ref 70–99)
Glucose-Capillary: 217 mg/dL — ABNORMAL HIGH (ref 70–99)
Glucose-Capillary: 269 mg/dL — ABNORMAL HIGH (ref 70–99)
Glucose-Capillary: 64 mg/dL — ABNORMAL LOW (ref 70–99)
Glucose-Capillary: 70 mg/dL (ref 70–99)
Glucose-Capillary: 76 mg/dL (ref 70–99)
Glucose-Capillary: 86 mg/dL (ref 70–99)

## 2019-08-21 MED ORDER — POTASSIUM CHLORIDE 10 MEQ/50ML IV SOLN
10.0000 meq | INTRAVENOUS | Status: AC
  Administered 2019-08-21 (×4): 10 meq via INTRAVENOUS
  Filled 2019-08-21 (×4): qty 50

## 2019-08-21 MED ORDER — POTASSIUM CHLORIDE 20 MEQ/15ML (10%) PO SOLN: 40.0000 meq | Freq: Once | ORAL | Status: DC

## 2019-08-21 MED ORDER — POTASSIUM CHLORIDE 20 MEQ/15ML (10%) PO SOLN
20.0000 meq | ORAL | Status: AC
  Administered 2019-08-21 (×2): 20 meq
  Filled 2019-08-21 (×2): qty 15

## 2019-08-21 MED ORDER — FUROSEMIDE 10 MG/ML IJ SOLN
40.0000 mg | Freq: Three times a day (TID) | INTRAMUSCULAR | Status: AC
  Administered 2019-08-21 (×2): 40 mg via INTRAVENOUS
  Filled 2019-08-21 (×2): qty 4

## 2019-08-21 MED ORDER — ACETYLCYSTEINE 20 % IN SOLN
4.0000 mL | Freq: Four times a day (QID) | RESPIRATORY_TRACT | Status: DC
  Administered 2019-08-21 (×2): 4 mL via RESPIRATORY_TRACT
  Filled 2019-08-21 (×3): qty 4

## 2019-08-21 NOTE — Progress Notes (Signed)
[facility] ADULT ICU REPLACEMENT PROTOCOL   The patient does apply for the [facility] Adult ICU Electrolyte Replacment Protocol based on the criteria listed below:   1. Is GFR >/= 30 ml/min? Yes.    Patient's GFR today is >60 2. Is SCr </= 2? Yes.   Patient's SCr is 0.58 ml/kg/hr 3. Did SCr increase >/= 0.5 in 24 hours?no 4. Abnormal electrolyte(s): K-3.2 5. Ordered repletion with: per protocol 6. If a panic level lab has been reported, has the CCM MD in charge been notified? No..   Physician:  Dr. Lurline Hare, Philis Nettle 08/21/2019 5:52 AM

## 2019-08-21 NOTE — Progress Notes (Signed)
Nutrition Follow-up  DOCUMENTATION CODES:   Severe malnutrition in context of chronic illness  INTERVENTION:   Continue current tube feeding regimen: - Vital 1.5 @ 45 ml/hr (1080 ml/day) via NG tube - ProSource TF 45 ml daily - Free water per MD, currently 100 ml q 8 hours  Tube feeding regimen provides1640kcal, 84grams of protein, and 853m of H2O.  Current free water flushes provide an additional 300 ml of free water daily.  NUTRITION DIAGNOSIS:   Severe Malnutrition related to chronic illness (poorly controlled T2DM, pancreatitis) as evidenced by moderate fat depletion, moderate muscle depletion, severe muscle depletion, percent weight loss (10.4% weight loss in 4 months).  Ongoing  GOAL:   Provide needs based on ASPEN/SCCM guidelines  Met via TF  MONITOR:   Vent status, Labs, Weight trends, TF tolerance  REASON FOR ASSESSMENT:   Ventilator    ASSESSMENT:   64year old male who presented to the ED on 7/22 with respiratory distress after being found unresponsive on the floor by family. Pt had multiple seizures in the field. PMH of bipolar disorder, T2DM (poorly compliant with insulin regimen), GERD, pancreatitis, schizophrenia, seizures. Pt required intubation for airway protection.  7/23 - trickle TF initiated, extubated 7/25 - intubated  Discussed pt with RN and during ICU rounds. Pt's mental status has improved some. Pt tolerating TF without difficulty. Noted pt with episodes of hypoglycemia overnight.  NG tube remains in place with TF infusing.  EDW: 49.5 kg. Pt continues to have edema to perineal region per flowsheets.  Current TF: Vital 1.5 @ 45 ml/hr, ProSource TF 45 daily daily, free water 100 ml q 8 hours  Patient is currently intubated on ventilator support MV: 14.7 L/min Temp (24hrs), Avg:99.2 F (37.3 C), Min:97.9 F (36.6 C), Max:100 F (37.8 C) BP (a-line): 104/56 MAP (a-line): 106  Drips: Amiodarone  Medications reviewed and  include: Lasix 40 mg x 2, SSI q 4 hours, Creon TID, protonix, KCl 20 mEq x 2 doses, IV KCl 10 mEq x 4 runs  Labs reviewed: sodium 147, elevated LFTs, hemoglobin 9.2 CBG's: 52-308 x 24 hours  UOP: 1595 ml x 24 hours Stool: 100 ml x 24 hours via rectal tube I/O's: +13.5 L since admit  Diet Order:   Diet Order            Diet NPO time specified  Diet effective now                 EDUCATION NEEDS:   No education needs have been identified at this time  Skin:  Skin Assessment: Reviewed RN Assessment (MASD to groin)  Last BM:  08/21/19 type 7 via rectal tube  Height:   Ht Readings from Last 1 Encounters:  08/19/2019 5' 4"  (1.626 m)    Weight:   Wt Readings from Last 1 Encounters:  08/21/19 60.6 kg    Ideal Body Weight:  59.1 kg  BMI:  Body mass index is 22.93 kg/m.  Estimated Nutritional Needs:   Kcal:  1640  Protein:  75-90 grams  Fluid:  >/= 1.6 L    KGaynell Face MS, RD, LDN Inpatient Clinical Dietitian Please see AMiON for contact information.

## 2019-08-21 NOTE — Progress Notes (Signed)
NAME:  Michael [name], MRN:  [id number], DOB:  1955-05-15, LOS: 7 ADMISSION DATE:  07/30/2019, CONSULTATION DATE: 07/29/2019 REFERRING MD: EDP, CHIEF COMPLAINT: Altered mental status in the setting of hyperglycemia suspected pneumonia complicated by poor nutritional status and failure to thrive.  Brief History   64 year old with history of noncompliance insulin-dependent diabetes mellitus who presents 08/13/2019 with seizure activity,  altered mental status, hyperglycemia, and hypokalemic.  Pulmonary critical care asked to admit.  Past Medical History  Anxiety, OA, Bipolar, HA, DM type 2 with neuropathy, GERD, Hep B, Pancreatitis, Seizures  Significant Hospital Events   7/22 admit, AMS, blood sugar > 1400, seizure, aspiration, VDRF, pressors 7/23 off pressors 7/24 urology placed foley 7/25 reintubated due to worsening hypoxia 7/27 A fib with RVR - add amiodarone 7/29 change to pressure control  Consults:  7/22 neurology s/o 7/24 7/24 urology  Procedures:  Lt IJ CVL 7/22 >>  A-line 7/25 >>  ETT 7/25 >>   Significant Diagnostic Tests:   EEG 7/22 >> continuous slowing  CT head 7/22 >> atrophy, chronic small vessel ischemic changes, chronic thalamic lacunar infarcts  CT chest 7/22 >> multifocal pneumonia, changes of cirrhosis, small volume ascites, atherosclerosis  Echo 7/28 >> EF 60 to 65%, grade 1 DD  Micro Data:  7/22 blood culture x2>> 7/22 urine culture>> negative 7/22 COVID-19 testing>> negative 7/29 Sputum  Antimicrobials:  08/20/2019 vancomycin>> 7/23 08/07/2019 cefepime>> 7/25 08/17/2019 rocephin >> 7/28  Interim history/subjective:  Low SpO2, tachycardic.  Episode of hypoglycemia overnight.  Objective   Blood pressure (!) 80/66, pulse (!) 129, temperature 99 F (37.2 C), temperature source Oral, resp. rate (!) 27, height 5\' 4"  (1.626 m), weight 60.6 kg, SpO2 92 %.    Vent Mode: PRVC FiO2 (%):  [50 %-60 %] 60 % Set Rate:  [16 bmp] 16 bmp Vt Set:  [470 mL]  470 mL PEEP:  [5 cmH20] 5 cmH20 Plateau Pressure:  [18 cmH20] 18 cmH20   Intake/Output Summary (Last 24 hours) at 08/21/2019 5852 Last data filed at 08/21/2019 0600 Gross per 24 hour  Intake 2161.21 ml  Output 1645 ml  Net 516.21 ml   Filed Weights   08/19/19 0500 08/20/19 0500 08/21/19 0446  Weight: 58.2 kg 62 kg 60.6 kg    Examination:  General - sedated Eyes - pupils reactive ENT - ETT in place Cardiac - regular, tachycardic Chest - b/l crackles Abdomen - soft, non tender, + bowel sounds Extremities - 1+ edema Skin - no rashes Neuro - RASS -1, follows simple commands GU - scrotal edema   Resolved Hospital Problem list   Lactic acidosis, Hypernatremia, Hypokalemia, Hypoglycemia  Assessment & Plan:   Acute hypoxic respiratory failure from aspiration pneumonia. - change to pressure control 22 over PEEP 10 with RR 22 - goal SpO2 > 92% - f/u CXR - completed ABx 7/28 - lasix 40 mg IV q8h x 2 doses on 7/78  Acute metabolic encephalopathy 2nd to hypoxia. History of seizures with recurrent seizure in setting of hyperglycemia. Hx of Bipolar, schizophrenia, neuropathy. - RASS goal 0 to -1 - prn versed, fentanyl - decreased abilify to 1 mg qhs and gapabentin 300 mg q12h on 7/28 - continue keppra - mental status improved some on 7/29  Shock. - from sepsis, hypovolemia - goal MAP > 65  A fib with RVR on 7/27. - back in sinus rhythm 7/28 - Echo unremarkable - complete current infusion of amiodarone >> will not renew if he remains in sinus rhythm  Thrombocytopenia.  - possibly from medication (?cefepime, pepcid) - ABx changed 7/25 - improving - f/u CBC  Anemia of critical illness. - transfuse for Hb < 7 or significant bleeding  Hypokalemia. - f/u BMET  Severe protein calorie malnutrition. Chronic pancreatitis. - continue creon  Urine retention. - foley placed by urology 7/24 and reassessed on 7/28 - keep foley in for now  DM type 2 poorly controlled  with episodes of hyperglycemia and hypoglycemia. - SSI - hold levemir  Best practice:  Diet: tube feeds DVT prophylaxis: SCDs GI prophylaxis: protonix Mobility: Bedrest Code Status: Full Disposition: ICU  Labs    CMP Latest Ref Rng & Units 08/21/2019 08/20/2019 08/19/2019  Glucose 70 - 99 mg/dL 156(H) 228(H) 301(H)  BUN 8 - 23 mg/dL 10 9 11   Creatinine 0.61 - 1.24 mg/dL 0.58(L) 0.60(L) 0.65  Sodium 135 - 145 mmol/L 146(H) 145 145  Potassium 3.5 - 5.1 mmol/L 3.2(L) 3.9 4.1  Chloride 98 - 111 mmol/L 112(H) 115(H) 118(H)  CO2 22 - 32 mmol/L 26 24 22   Calcium 8.9 - 10.3 mg/dL 7.6(L) 7.7(L) 7.5(L)  Total Protein 6.5 - 8.1 g/dL - - 3.8(L)  Total Bilirubin 0.3 - 1.2 mg/dL - - 1.2  Alkaline Phos 38 - 126 U/L - - 542(H)  AST 15 - 41 U/L - - 94(H)  ALT 0 - 44 U/L - - 107(H)    CBC Latest Ref Rng & Units 08/21/2019 08/20/2019 08/19/2019  WBC 4.0 - 10.5 K/uL 13.3(H) 11.4(H) 6.7  Hemoglobin 13.0 - 17.0 g/dL 9.5(L) 10.3(L) 10.6(L)  Hematocrit 39 - 52 % 27.4(L) 30.0(L) 31.2(L)  Platelets 150 - 400 K/uL 51(L) 46(L) 36(L)    ABG    Component Value Date/Time   PHART 7.314 (L) 08/17/2019 0641   PCO2ART 41.2 08/17/2019 0641   PO2ART 214 (H) 08/17/2019 0641   HCO3 20.9 08/17/2019 0641   TCO2 22 08/17/2019 0641   ACIDBASEDEF 5.0 (H) 08/17/2019 0641   O2SAT 100.0 08/17/2019 0641    CBG (last 3)  Recent Labs    08/21/19 0403 08/21/19 0640 08/21/19 0729  GLUCAP 110* 181* 193*    Critical care time: 33 minutes  Chesley Mires, MD [place] Pager - [phone] - [phone] 08/21/2019, 8:07 AM

## 2019-08-22 ENCOUNTER — Inpatient Hospital Stay (HOSPITAL_COMMUNITY): Payer: Medicare HMO

## 2019-08-22 LAB — POCT I-STAT 7, (LYTES, BLD GAS, ICA,H+H)
Acid-Base Excess: 8 mmol/L — ABNORMAL HIGH (ref 0.0–2.0)
Bicarbonate: 31 mmol/L — ABNORMAL HIGH (ref 20.0–28.0)
Calcium, Ion: 1.17 mmol/L (ref 1.15–1.40)
HCT: 38 % — ABNORMAL LOW (ref 39.0–52.0)
Hemoglobin: 12.9 g/dL — ABNORMAL LOW (ref 13.0–17.0)
O2 Saturation: 91 %
Patient temperature: 97.2
Potassium: 3 mmol/L — ABNORMAL LOW (ref 3.5–5.1)
Sodium: 149 mmol/L — ABNORMAL HIGH (ref 135–145)
TCO2: 32 mmol/L (ref 22–32)
pCO2 arterial: 34.4 mmHg (ref 32.0–48.0)
pH, Arterial: 7.561 — ABNORMAL HIGH (ref 7.350–7.450)
pO2, Arterial: 51 mmHg — ABNORMAL LOW (ref 83.0–108.0)

## 2019-08-22 LAB — CBC
HCT: 26.7 % — ABNORMAL LOW (ref 39.0–52.0)
Hemoglobin: 9.5 g/dL — ABNORMAL LOW (ref 13.0–17.0)
MCH: 31.7 pg (ref 26.0–34.0)
MCHC: 35.6 g/dL (ref 30.0–36.0)
MCV: 89 fL (ref 80.0–100.0)
Platelets: 62 10*3/uL — ABNORMAL LOW (ref 150–400)
RBC: 3 MIL/uL — ABNORMAL LOW (ref 4.22–5.81)
RDW: 15.4 % (ref 11.5–15.5)
WBC: 17.3 10*3/uL — ABNORMAL HIGH (ref 4.0–10.5)
nRBC: 0.4 % — ABNORMAL HIGH (ref 0.0–0.2)

## 2019-08-22 LAB — GLUCOSE, CAPILLARY
Glucose-Capillary: 138 mg/dL — ABNORMAL HIGH (ref 70–99)
Glucose-Capillary: 160 mg/dL — ABNORMAL HIGH (ref 70–99)
Glucose-Capillary: 182 mg/dL — ABNORMAL HIGH (ref 70–99)
Glucose-Capillary: 192 mg/dL — ABNORMAL HIGH (ref 70–99)
Glucose-Capillary: 192 mg/dL — ABNORMAL HIGH (ref 70–99)
Glucose-Capillary: 195 mg/dL — ABNORMAL HIGH (ref 70–99)

## 2019-08-22 LAB — BASIC METABOLIC PANEL
Anion gap: 9 (ref 5–15)
BUN: 13 mg/dL (ref 8–23)
CO2: 29 mmol/L (ref 22–32)
Calcium: 7.7 mg/dL — ABNORMAL LOW (ref 8.9–10.3)
Chloride: 111 mmol/L (ref 98–111)
Creatinine, Ser: 0.57 mg/dL — ABNORMAL LOW (ref 0.61–1.24)
GFR calc Af Amer: >60 mL/min (ref >60)
GFR calc non Af Amer: >60 mL/min (ref >60)
Glucose, Bld: 212 mg/dL — ABNORMAL HIGH (ref 70–99)
Potassium: 3.7 mmol/L (ref 3.5–5.1)
Sodium: 149 mmol/L — ABNORMAL HIGH (ref 135–145)

## 2019-08-22 LAB — MAGNESIUM: Magnesium: 1.7 mg/dL (ref 1.7–2.4)

## 2019-08-22 MED ORDER — DEXMEDETOMIDINE HCL IN NACL 400 MCG/100ML IV SOLN
0.0000 ug/kg/h | INTRAVENOUS | Status: DC
  Administered 2019-08-22: 0.4 ug/kg/h via INTRAVENOUS
  Administered 2019-08-23: 0.2 ug/kg/h via INTRAVENOUS
  Filled 2019-08-22 (×2): qty 100

## 2019-08-22 MED ORDER — LACTATED RINGERS IV BOLUS
1000.0000 mL | Freq: Once | INTRAVENOUS | Status: AC
  Administered 2019-08-22: 1000 mL via INTRAVENOUS

## 2019-08-22 MED ORDER — PHENYLEPHRINE HCL-NACL 10-0.9 MG/250ML-% IV SOLN
0.0000 ug/min | INTRAVENOUS | Status: DC
  Administered 2019-08-22: 5 ug/min via INTRAVENOUS
  Administered 2019-08-23: 12 ug/min via INTRAVENOUS
  Filled 2019-08-22 (×2): qty 250

## 2019-08-22 MED ORDER — DOCUSATE SODIUM 50 MG/5ML PO LIQD
100.0000 mg | Freq: Two times a day (BID) | ORAL | Status: DC
  Administered 2019-08-22 – 2019-08-23 (×2): 100 mg via ORAL
  Filled 2019-08-22 (×4): qty 10

## 2019-08-22 MED ORDER — POLYETHYLENE GLYCOL 3350 17 G PO PACK
17.0000 g | PACK | Freq: Every day | ORAL | Status: DC
  Administered 2019-08-23: 17 g via ORAL
  Filled 2019-08-22: qty 1

## 2019-08-22 NOTE — Progress Notes (Signed)
ABG obtained on ventilator settings of PCV: 22, RR: 22, FIO2: 40%, and PEEP: 8.     Ref. Range 08/22/2019 11:22  Sample type Unknown ARTERIAL  pH, Arterial Latest Ref Range: 7.35 - 7.45  7.561 (H)  pCO2 arterial Latest Ref Range: 32 - 48 mmHg 34.4  pO2, Arterial Latest Ref Range: 83 - 108 mmHg 51 (L)  TCO2 Latest Ref Range: 22 - 32 mmol/L 32  Acid-Base Excess Latest Ref Range: 0.0 - 2.0 mmol/L 8.0 (H)  Bicarbonate Latest Ref Range: 20.0 - 28.0 mmol/L 31.0 (H)  O2 Saturation Latest Units: % 91.0  Patient temperature Unknown 97.2 F  Collection site Unknown Radial

## 2019-08-22 NOTE — Progress Notes (Signed)
[facility] Progress Note Patient Name: KELLY EISLER DOB: Aug 05, 1955 MRN: [id number]   Date of Service  08/22/2019  HPI/Events of Note  Hypotension - BP = 70/52 with MAP = 59. LVEF = 60-65%  eICU Interventions  Plan: 1. Bolus with LR 1 liter IV over 1 hour now.  2. Monitor CVP now and Q 4 hours. 3. Phenylephrine IV infusion. Titrate to MAP > 65.      Intervention Category Major Interventions: Hypotension - evaluation and management  Neysa Arts [place] 08/22/2019, 8:11 PM

## 2019-08-22 NOTE — Progress Notes (Signed)
MD notified:  Precedex, 0.71mcg - BP 69/54 MAP 60. Amio still running.  PO2 from ABG 51

## 2019-08-22 NOTE — Procedures (Signed)
Cortrak  Person Inserting Tube:  Rosezetta Schlatter, RD Tube Type:  Cortrak - 43 inches Tube Location:  Right nare Initial Placement:  Postpyloric Secured by: Bridle Technique Used to Measure Tube Placement:  Documented cm marking at nare/ corner of mouth Cortrak Secured At:  92 cm Procedure Comments:  Cortrak Tube Team Note:  Consult received to place a Cortrak feeding tube.  X-ray is required, abdominal x-ray has been ordered by the Cortrak team. Please confirm tube placement before using the Cortrak tube.   If the tube becomes dislodged please keep the tube and contact the Cortrak team at [email or web address] ([email or web address]) for replacement.  If after hours and replacement cannot be delayed, place a NG tube and confirm placement with an abdominal x-ray.      Jarome Matin, MS, RD, LDN, CNSC Inpatient Clinical Dietitian RD pager # available in [place]  After hours/weekend pager # available in [facility]

## 2019-08-22 NOTE — Progress Notes (Signed)
NAME:  Michael [name], MRN:  [id number], DOB:  18-Oct-1955, LOS: 8 ADMISSION DATE:  07/28/2019, CONSULTATION DATE: 08/13/2019 REFERRING MD: EDP, CHIEF COMPLAINT: Altered mental status in the setting of hyperglycemia suspected pneumonia complicated by poor nutritional status and failure to thrive.  Brief History   64 year old with history of noncompliance insulin-dependent diabetes mellitus who presents 08/13/2019 with seizure activity,  altered mental status, hyperglycemia, and hypokalemic.  Pulmonary critical care asked to admit.  Past Medical History  Anxiety, OA, Bipolar, HA, DM type 2 with neuropathy, GERD, Hep B, Pancreatitis, Seizures  Significant Hospital Events   7/22 admit, AMS, blood sugar > 1400, seizure, aspiration, VDRF, pressors 7/23 off pressors 7/24 urology placed foley 7/25 reintubated due to worsening hypoxia 7/27 A fib with RVR - add amiodarone 7/29 change to pressure control  Consults:  7/22 neurology s/o 7/24 7/24 urology  Procedures:  Lt IJ CVL 7/22 >>  A-line 7/25 >>  ETT 7/25 >>   Significant Diagnostic Tests:   EEG 7/22 >> continuous slowing  CT head 7/22 >> atrophy, chronic small vessel ischemic changes, chronic thalamic lacunar infarcts  CT chest 7/22 >> multifocal pneumonia, changes of cirrhosis, small volume ascites, atherosclerosis  Echo 7/28 >> EF 60 to 65%, grade 1 DD  Micro Data:  7/22 blood culture x2>> 7/22 urine culture>> negative 7/22 COVID-19 testing>> negative 7/29 Sputum >>   Antimicrobials:  08/02/2019 vancomycin>> 7/23 08/11/2019 cefepime>> 7/25 08/17/2019 rocephin >> 7/28  Interim history/subjective:  Concerned he might have aspirated overnight.  More agitated overnight.  Received versed this morning.  Objective   Blood pressure 101/67, pulse 87, temperature (!) 97.2 F (36.2 C), temperature source Axillary, resp. rate 22, height 5\' 4"  (1.626 m), weight 61.3 kg, SpO2 98 %.    Vent Mode: PCV FiO2 (%):  [40 %-60 %] 40 % Set  Rate:  [22 bmp] 22 bmp PEEP:  [10 cmH20] 10 cmH20 Plateau Pressure:  [16 cmH20-26 cmH20] 23 cmH20   Intake/Output Summary (Last 24 hours) at 08/22/2019 0831 Last data filed at 08/22/2019 0600 Gross per 24 hour  Intake 1737.58 ml  Output 2775 ml  Net -1037.42 ml   Filed Weights   08/20/19 0500 08/21/19 0446 08/22/19 0452  Weight: 62 kg 60.6 kg 61.3 kg    Examination:  General - sedated Eyes - pupils reactive ENT - no sinus tenderness, no stridor Cardiac - regular, no murmur Chest - scattered rhonchi Abdomen - soft, non tender, + bowel sounds Extremities - 1+ edema Skin - no rashes Neuro - RASS -2 GU - phimosis, decreased scrotal edema    Resolved Hospital Problem list   Lactic acidosis, Hypernatremia, Hypokalemia, Hypoglycemia, Septic shock, Hypovolemic shock  Assessment & Plan:   Acute hypoxic respiratory failure from aspiration pneumonia. - pressure control 22 over PEEP 8 with RR 16 - goal SpO2 > 92% - f/u CXR - hold diuresis 7/30 in setting of lower blood pressure  Acute metabolic encephalopathy 2nd to hypoxia. History of seizures with recurrent seizure in setting of hyperglycemia. Hx of Bipolar, schizophrenia, neuropathy. - RASS goal 0 to -1 - add precedex 7/30 - prn versed, fentanly - continue keppra - decreased abilify to 1 mg qhs and gapabentin 300 mg q12h on 7/28  A fib with RVR on 7/27. - back in sinus rhythm 7/28 - Echo unremarkable - complete current infusion of amiodarone >> will not renew if he remains in sinus rhythm  Thrombocytopenia.  - possibly from medication (?cefepime, pepcid) - ABx changed 7/25 -  impoving - f/u CBC  Anemia of critical illness. - transfuse for Hb < 7 or significant bleeding  Hypokalemia. - f/u BMET  Severe protein calorie malnutrition. Chronic pancreatitis. - continue creon - place cortrak - continue tube feeds  Urine retention, phimosis. - foley placed by urology 7/24 and reassessed on 7/28 - keep foley in  for now  DM type 2 poorly controlled with episodes of hyperglycemia and hypoglycemia. - SSI - hold levemir  Best practice:  Diet: tube feeds DVT prophylaxis: SCDs GI prophylaxis: protonix Mobility: Bedrest Code Status: Full Disposition: ICU  Labs    CMP Latest Ref Rng & Units 08/22/2019 08/21/2019 08/21/2019  Glucose 70 - 99 mg/dL 212(H) - 156(H)  BUN 8 - 23 mg/dL 13 - 10  Creatinine 0.61 - 1.24 mg/dL 0.57(L) - 0.58(L)  Sodium 135 - 145 mmol/L 149(H) 147(H) 146(H)  Potassium 3.5 - 5.1 mmol/L 3.7 3.8 3.2(L)  Chloride 98 - 111 mmol/L 111 - 112(H)  CO2 22 - 32 mmol/L 29 - 26  Calcium 8.9 - 10.3 mg/dL 7.7(L) - 7.6(L)  Total Protein 6.5 - 8.1 g/dL - - -  Total Bilirubin 0.3 - 1.2 mg/dL - - -  Alkaline Phos 38 - 126 U/L - - -  AST 15 - 41 U/L - - -  ALT 0 - 44 U/L - - -    CBC Latest Ref Rng & Units 08/22/2019 08/21/2019 08/21/2019  WBC 4.0 - 10.5 K/uL 17.3(H) - 13.3(H)  Hemoglobin 13.0 - 17.0 g/dL 9.5(L) 9.2(L) 9.5(L)  Hematocrit 39 - 52 % 26.7(L) 27.0(L) 27.4(L)  Platelets 150 - 400 K/uL 62(L) - 51(L)    ABG    Component Value Date/Time   PHART 7.469 (H) 08/21/2019 0843   PCO2ART 33.7 08/21/2019 0843   PO2ART 98 08/21/2019 0843   HCO3 24.4 08/21/2019 0843   TCO2 25 08/21/2019 0843   ACIDBASEDEF 5.0 (H) 08/17/2019 0641   O2SAT 98.0 08/21/2019 0843    CBG (last 3)  Recent Labs    08/21/19 2354 08/22/19 0406 08/22/19 0803  GLUCAP 103* 182* 192*    Critical care time: 32 minutes  Chesley Mires, MD [place] Pager - [phone] 08/22/2019, 8:31 AM

## 2019-08-23 ENCOUNTER — Inpatient Hospital Stay (HOSPITAL_COMMUNITY): Payer: Medicare HMO

## 2019-08-23 LAB — CBC
HCT: 27.6 % — ABNORMAL LOW (ref 39.0–52.0)
Hemoglobin: 9.6 g/dL — ABNORMAL LOW (ref 13.0–17.0)
MCH: 31.7 pg (ref 26.0–34.0)
MCHC: 34.8 g/dL (ref 30.0–36.0)
MCV: 91.1 fL (ref 80.0–100.0)
Platelets: 82 10*3/uL — ABNORMAL LOW (ref 150–400)
RBC: 3.03 MIL/uL — ABNORMAL LOW (ref 4.22–5.81)
RDW: 15.9 % — ABNORMAL HIGH (ref 11.5–15.5)
WBC: 16.2 10*3/uL — ABNORMAL HIGH (ref 4.0–10.5)
nRBC: 0.4 % — ABNORMAL HIGH (ref 0.0–0.2)

## 2019-08-23 LAB — GLUCOSE, CAPILLARY
Glucose-Capillary: 146 mg/dL — ABNORMAL HIGH (ref 70–99)
Glucose-Capillary: 129 mg/dL — ABNORMAL HIGH (ref 70–99)
Glucose-Capillary: 185 mg/dL — ABNORMAL HIGH (ref 70–99)
Glucose-Capillary: 202 mg/dL — ABNORMAL HIGH (ref 70–99)
Glucose-Capillary: 227 mg/dL — ABNORMAL HIGH (ref 70–99)

## 2019-08-23 LAB — CULTURE, RESPIRATORY W GRAM STAIN: Culture: NORMAL

## 2019-08-23 LAB — BASIC METABOLIC PANEL
Anion gap: 6 (ref 5–15)
BUN: 15 mg/dL (ref 8–23)
CO2: 29 mmol/L (ref 22–32)
Calcium: 7.5 mg/dL — ABNORMAL LOW (ref 8.9–10.3)
Chloride: 111 mmol/L (ref 98–111)
Creatinine, Ser: 0.51 mg/dL — ABNORMAL LOW (ref 0.61–1.24)
GFR calc Af Amer: >60 mL/min (ref >60)
GFR calc non Af Amer: >60 mL/min (ref >60)
Glucose, Bld: 145 mg/dL — ABNORMAL HIGH (ref 70–99)
Potassium: 3.1 mmol/L — ABNORMAL LOW (ref 3.5–5.1)
Sodium: 146 mmol/L — ABNORMAL HIGH (ref 135–145)

## 2019-08-23 MED ORDER — FENTANYL CITRATE (PF) 100 MCG/2ML IJ SOLN
INTRAMUSCULAR | Status: AC
  Filled 2019-08-23: qty 2

## 2019-08-23 MED ORDER — POTASSIUM CHLORIDE 20 MEQ/15ML (10%) PO SOLN
20.0000 meq | ORAL | Status: AC
  Administered 2019-08-23 (×3): 20 meq
  Filled 2019-08-23 (×3): qty 15

## 2019-08-23 MED ORDER — MIDAZOLAM HCL 2 MG/2ML IJ SOLN
INTRAMUSCULAR | Status: AC
  Filled 2019-08-23: qty 2

## 2019-08-23 MED ORDER — ROCURONIUM BROMIDE 10 MG/ML (PF) SYRINGE
PREFILLED_SYRINGE | INTRAVENOUS | Status: AC
  Filled 2019-08-23: qty 10

## 2019-08-23 MED ORDER — ETOMIDATE 2 MG/ML IV SOLN
INTRAVENOUS | Status: AC
  Filled 2019-08-23: qty 20

## 2019-08-23 NOTE — Progress Notes (Signed)
Patient self-extubated. Elink, Respiratory, and  Ground team notified. Patient is awake and following commands. RT placed on NRB. Once Dr. Loanne Drilling came to bedside, patient was placed on 15L HFNC. Patient NTS. Sats 94%. Continuing to monitor patient closely.

## 2019-08-23 NOTE — Evaluation (Signed)
Clinical/Bedside Swallow Evaluation Patient Details  Name: Michael [name] MRN: [id number] Date of Birth: 09/30/1955  Today's Date: 08/23/2019 Time: SLP Start Time (ACUTE ONLY): 1420 SLP Stop Time (ACUTE ONLY): 1430 SLP Time Calculation (min) (ACUTE ONLY): 10 min  Past Medical History:  Past Medical History:  Diagnosis Date  . Anxiety   . Arthritis    "joints; shoulders; feet" (06/08/2016)  . Bipolar disorder ([place])   . Daily headache    "7:30 - 8:00 q night" (06/08/2016)  . Diabetic peripheral neuropathy ([place])   . GERD (gastroesophageal reflux disease)   . History of hepatitis B virus infection conferring immunity 03/22/2018  . Pancreatitis   . Schizophrenia ([place])   . Seizures ([place]) 01/2016 X 2  . Type II diabetes mellitus ([place])    Past Surgical History:  Past Surgical History:  Procedure Laterality Date  . BALLOON DILATION  03/22/2011   Procedure: BALLOON DILATION;  Surgeon: Gatha Mayer, MD;  Location: [facility];  Service: Endoscopy;  Laterality: N/A;  . COLONOSCOPY N/A 05/22/2012   Procedure: COLONOSCOPY;  Surgeon: Gatha Mayer, MD;  Location: [facility];  Service: Endoscopy;  Laterality: N/A;  . COLONOSCOPY W/ BIOPSIES AND POLYPECTOMY  09/12/11  . ESOPHAGOGASTRODUODENOSCOPY  03/22/2011   Procedure: ESOPHAGOGASTRODUODENOSCOPY (EGD);  Surgeon: Gatha Mayer, MD;  Location: [facility];  Service: Endoscopy;  Laterality: N/A;  egd with balloon   . EUS  04/20/2011   Procedure: UPPER ENDOSCOPIC ULTRASOUND (EUS) LINEAR;  Surgeon: Milus Banister, MD;  Location: [facility];  Service: Endoscopy;  Laterality: N/A;  . KNEE ARTHROSCOPY Left    HPI:  64 year old with history of noncompliance insulin-dependent diabetes mellitus who presents 07/30/2019 with seizure activity, altered mental status, hyperglycemia, and hypokalemic. Pt with prolonged intubation, across multiple intubations, with self extubation 7/31.  Pt known to this service from prior admission during which MBSS on  03/14/19 revealed a "moderate oropharyngeal phase dysphagia characterized by impaired bolus control and a pharyngeal delay which resulted in penetration and aspiration" with pt intermittently sensate to aspiration.  Pt was placed on puree diet with honey thick liquid in February.  Pt states that he has returned to regular, unmodified textures.   Assessment / Plan / Recommendation Clinical Impression  Pt presents with clinical indicators of pharyngeal dysphagia in setting of recent VDRF with pt intubated 8 day across 2 intubations. Pt recently self exectubated.  Vocal quality is weak and hoarse.  Pt endorses that this is a change from baseline.  There was wet coughing noted following trials of water.  Given hx of oropharyngeal dysphagia with silent aspiration noted on MBSS in Febuary of this year, trials of other consistencies deferred.  Recommend MBSS prior to initiation of PO diet.  Pt is nutritionally supported with NG at this time.  Will plan for MBSS as SLP and radiology schedule permit. SLP Visit Diagnosis: Dysphagia, oropharyngeal phase (R13.12)    Aspiration Risk  Severe aspiration risk    Diet Recommendation NPO   Medication Administration: Via alternative means    Other  Recommendations Oral Care Recommendations: Oral care QID   Follow up Recommendations  (TBD)      Frequency and Duration  (TBD)          Prognosis   Good     Swallow Study   General Date of Onset: 08/20/2019 HPI: 64 year old with history of noncompliance insulin-dependent diabetes mellitus who presents 08/20/2019 with seizure activity, altered mental status, hyperglycemia, and hypokalemic. Pt with prolonged intubation, across multiple  intubations, with self extubation 7/31.  Pt known to this service from prior admission during which MBSS on 03/14/19 revealed a "moderate oropharyngeal phase dysphagia characterized by impaired bolus control and a pharyngeal delay which resulted in penetration and aspiration" with pt  intermittently sensate to aspiration.  Pt was placed on puree diet with honey thick liquid in February.  Pt states that he has returned to regular, unmodified textures. Type of Study: Bedside Swallow Evaluation Diet Prior to this Study: NPO;NG Tube Respiratory Status: Nasal cannula History of Recent Intubation: Yes Length of Intubations (days): 8 days (2 intubations) Date extubated: 08/23/19 Behavior/Cognition: Alert;Cooperative Oral Cavity Assessment: Within Functional Limits Oral Care Completed by SLP: No Oral Cavity - Dentition: Edentulous Patient Positioning: Partially reclined Volitional Cough: Weak    Oral/Motor/Sensory Function Overall Oral Motor/Sensory Function: Within functional limits Facial ROM: Within Functional Limits Facial Symmetry: Within Functional Limits Lingual ROM: Within Functional Limits Lingual Symmetry: Within Functional Limits Lingual Strength: Within Functional Limits Velum: Within Functional Limits Mandible: Within Functional Limits   Ice Chips Ice chips: Not tested   Thin Liquid Thin Liquid: Impaired Presentation: Straw;Cup Pharyngeal  Phase Impairments: Suspected delayed Swallow;Decreased hyoid-laryngeal movement;Multiple swallows;Cough - Immediate    Nectar Thick Nectar Thick Liquid: Not tested   Honey Thick Honey Thick Liquid: Not tested   Puree Puree: Not tested   Solid     Solid: Not tested      Michael [name], Michael, [name] Office: [phone]; Pager 7/31: [phone] 08/23/2019,2:37 PM

## 2019-08-23 NOTE — Progress Notes (Signed)
Pt self extubated, RT placed pt on NRB and pt tolerated well. RT then NTS patient with copious return of thick secretions. Placed pt on high flow [place] at 15L and pt tolerating well.

## 2019-08-23 NOTE — Plan of Care (Signed)
PCCM Plan of Care  Patient self-extubated. On assessment, patient awake, alert and following commands. Switched from NRB to [place] with saturations >92%. Patient does have reported secretions with weak cough. No indication to intubate at this time but low threshold for airway protection and/or hypoxemia if needed later this morning.  Rodman Pickle, M.D. [facility] Pulmonary/Critical Care Medicine 08/23/2019 3:40 AM

## 2019-08-23 NOTE — Progress Notes (Signed)
[facility] Progress Note Patient Name: Michael [name] DOB: 09/23/1955 MRN: [id number]   Date of Service  08/23/2019  HPI/Events of Note  Ca++ = 7.5 which corrects to 9.58 (Normal) given albumin = 1.4.   eICU Interventions  No intervention indicated.      Intervention Category Major Interventions: Electrolyte abnormality - evaluation and management  [name] 08/23/2019, 6:03 AM

## 2019-08-23 NOTE — Progress Notes (Signed)
NAME:  Michael [name], MRN:  [id number], DOB:  1955/08/08, LOS: 9 ADMISSION DATE:  08/19/2019, CONSULTATION DATE: 08/19/2019 REFERRING MD: EDP, CHIEF COMPLAINT: Altered mental status in the setting of hyperglycemia suspected pneumonia complicated by poor nutritional status and failure to thrive.  Brief History   64 year old with history of noncompliance insulin-dependent diabetes mellitus who presents 08/16/2019 with seizure activity,  altered mental status, hyperglycemia, and hypokalemic.  Pulmonary critical care asked to admit.  Past Medical History  Anxiety, OA, Bipolar, HA, DM type 2 with neuropathy, GERD, Hep B, Pancreatitis, Seizures  Significant Hospital Events   7/22 admit, AMS, blood sugar > 1400, seizure, aspiration, VDRF, pressors 7/23 off pressors 7/24 urology placed foley 7/25 reintubated due to worsening hypoxia 7/27 A fib with RVR - add amiodarone 7/29 change to pressure control  Consults:  7/22 neurology s/o 7/24 7/24 urology  Procedures:  Lt IJ CVL 7/22 >>  A-line 7/25 >>  ETT 7/25 >> 7/31  Significant Diagnostic Tests:   EEG 7/22 >> continuous slowing  CT head 7/22 >> atrophy, chronic small vessel ischemic changes, chronic thalamic lacunar infarcts  CT chest 7/22 >> multifocal pneumonia, changes of cirrhosis, small volume ascites, atherosclerosis  Echo 7/28 >> EF 60 to 65%, grade 1 DD  Micro Data:  7/22 blood culture x2>> 7/22 urine culture>> negative 7/22 COVID-19 testing>> negative 7/29 Sputum >>   Antimicrobials:  08/18/2019 vancomycin>> 7/23 07/27/2019 cefepime>> 7/25 08/17/2019 rocephin >> 7/28  Interim history/subjective:  Self extubated last night currently on 15 L/min and tolerating  Objective   Blood pressure (!) 104/63, pulse 61, temperature 98.1 F (36.7 C), temperature source Oral, resp. rate (!) 27, height 5\' 4"  (1.626 m), weight 61.5 kg, SpO2 (!) 89 %. CVP:  [5 mmHg-6 mmHg] 5 mmHg  Vent Mode: PCV FiO2 (%):  [40 %] 40 % Set Rate:   [16 bmp-22 bmp] 16 bmp PEEP:  [8 cmH20] 8 cmH20 Plateau Pressure:  [17 cmH20-28 cmH20] 28 cmH20   Intake/Output Summary (Last 24 hours) at 08/23/2019 5537 Last data filed at 08/23/2019 0900 Gross per 24 hour  Intake 4299.47 ml  Output 1295 ml  Net 3004.47 ml   Filed Weights   08/21/19 0446 08/22/19 0452 08/23/19 0445  Weight: 60.6 kg 61.3 kg 61.5 kg    Examination:  General -chronically ill-appearing man on nasal cannula no distress Eyes -pupils equal ENT -adequate airway protection, no stridor, no secretions Cardiac -regular, distant, no murmur Chest -coarse bilateral breath sounds, no wheeze Abdomen -nondistended, positive bowel sounds Extremities -1+ lower extremity edema Skin -no rash Neuro -awake, interacts, answers questions, follows commands, adequate cough on command GU -phimosis, some scrotal edema    Resolved Hospital Problem list   Lactic acidosis, Hypernatremia, Hypokalemia, Hypoglycemia, Septic shock, Hypovolemic shock  Assessment & Plan:   Acute hypoxic respiratory failure from aspiration pneumonia. -Extubated 7/31, push pulmonary hygiene, out of bed, secretion management -Wean high flow nasal cannula as able -Follow intermittent chest x-ray -Goal diuresis as blood pressure and renal function can tolerate  Acute metabolic encephalopathy 2nd to hypoxia. History of seizures with recurrent seizure in setting of hyperglycemia. Hx of Bipolar, schizophrenia, neuropathy. -Improving.  Careful with sedating medications.  Precedex to off -Continue Keppra -Continue same dose Abilify, gabapentin  A fib with RVR on 7/27.  Echo reassuring -Sinus rhythm currently -Amiodarone discontinued  Thrombocytopenia.  Improving 7/30, 7/31 -Question due to cefepime, Pepcid, infection -Antibiotics completed -Follow intermittent CBC  Anemia of critical illness. -Follow CBC intermittently - transfuse  for Hb < 7 or significant bleeding  Hypokalemia. -Replace 7/31 -Follow  BMP  Severe protein calorie malnutrition. Chronic pancreatitis. -Continue chronic Creon -NG tube in place with tube feedings running -will ask SLP to eval   Urine retention, phimosis. -Continue Foley for now, was placed by urology due to his phimosis  DM type 2 poorly controlled with episodes of hyperglycemia and hypoglycemia. -Sliding-scale insulin as ordered. -Levemir on hold  Best practice:  Diet: tube feeds DVT prophylaxis: SCDs GI prophylaxis: protonix Mobility: Bedrest Code Status: Full Disposition: ICU  Labs    CMP Latest Ref Rng & Units 08/23/2019 08/22/2019 08/22/2019  Glucose 70 - 99 mg/dL 145(H) - 212(H)  BUN 8 - 23 mg/dL 15 - 13  Creatinine 0.61 - 1.24 mg/dL 0.51(L) - 0.57(L)  Sodium 135 - 145 mmol/L 146(H) 149(H) 149(H)  Potassium 3.5 - 5.1 mmol/L 3.1(L) 3.0(L) 3.7  Chloride 98 - 111 mmol/L 111 - 111  CO2 22 - 32 mmol/L 29 - 29  Calcium 8.9 - 10.3 mg/dL 7.5(L) - 7.7(L)  Total Protein 6.5 - 8.1 g/dL - - -  Total Bilirubin 0.3 - 1.2 mg/dL - - -  Alkaline Phos 38 - 126 U/L - - -  AST 15 - 41 U/L - - -  ALT 0 - 44 U/L - - -    CBC Latest Ref Rng & Units 08/23/2019 08/22/2019 08/22/2019  WBC 4.0 - 10.5 K/uL 16.2(H) - 17.3(H)  Hemoglobin 13.0 - 17.0 g/dL 9.6(L) 12.9(L) 9.5(L)  Hematocrit 39 - 52 % 27.6(L) 38.0(L) 26.7(L)  Platelets 150 - 400 K/uL 82(L) - 62(L)    ABG    Component Value Date/Time   PHART 7.561 (H) 08/22/2019 1122   PCO2ART 34.4 08/22/2019 1122   PO2ART 51 (L) 08/22/2019 1122   HCO3 31.0 (H) 08/22/2019 1122   TCO2 32 08/22/2019 1122   ACIDBASEDEF 5.0 (H) 08/17/2019 0641   O2SAT 91.0 08/22/2019 1122    CBG (last 3)  Recent Labs    08/22/19 2353 08/23/19 0344 08/23/19 0757  GLUCAP 138* 146* 129*    Critical care time: 31 minutes   Baltazar Apo, MD, PhD 08/23/2019, 9:34 AM [facility] [phone] or if no answer [phone]

## 2019-08-24 ENCOUNTER — Inpatient Hospital Stay (HOSPITAL_COMMUNITY): Payer: Medicare HMO

## 2019-08-24 LAB — GLUCOSE, CAPILLARY
Glucose-Capillary: 118 mg/dL — ABNORMAL HIGH (ref 70–99)
Glucose-Capillary: 140 mg/dL — ABNORMAL HIGH (ref 70–99)
Glucose-Capillary: 157 mg/dL — ABNORMAL HIGH (ref 70–99)
Glucose-Capillary: 163 mg/dL — ABNORMAL HIGH (ref 70–99)
Glucose-Capillary: 168 mg/dL — ABNORMAL HIGH (ref 70–99)
Glucose-Capillary: 174 mg/dL — ABNORMAL HIGH (ref 70–99)
Glucose-Capillary: 227 mg/dL — ABNORMAL HIGH (ref 70–99)
Glucose-Capillary: 236 mg/dL — ABNORMAL HIGH (ref 70–99)

## 2019-08-24 LAB — MAGNESIUM: Magnesium: 1.8 mg/dL (ref 1.7–2.4)

## 2019-08-24 LAB — BASIC METABOLIC PANEL
Anion gap: 6 (ref 5–15)
BUN: 14 mg/dL (ref 8–23)
CO2: 29 mmol/L (ref 22–32)
Calcium: 7.6 mg/dL — ABNORMAL LOW (ref 8.9–10.3)
Chloride: 111 mmol/L (ref 98–111)
Creatinine, Ser: 0.49 mg/dL — ABNORMAL LOW (ref 0.61–1.24)
GFR calc Af Amer: >60 mL/min (ref >60)
GFR calc non Af Amer: >60 mL/min (ref >60)
Glucose, Bld: 197 mg/dL — ABNORMAL HIGH (ref 70–99)
Potassium: 3.8 mmol/L (ref 3.5–5.1)
Sodium: 146 mmol/L — ABNORMAL HIGH (ref 135–145)

## 2019-08-24 LAB — CBC
HCT: 27.3 % — ABNORMAL LOW (ref 39.0–52.0)
Hemoglobin: 9.5 g/dL — ABNORMAL LOW (ref 13.0–17.0)
MCH: 31.9 pg (ref 26.0–34.0)
MCHC: 34.8 g/dL (ref 30.0–36.0)
MCV: 91.6 fL (ref 80.0–100.0)
Platelets: 87 10*3/uL — ABNORMAL LOW (ref 150–400)
RBC: 2.98 MIL/uL — ABNORMAL LOW (ref 4.22–5.81)
RDW: 16.4 % — ABNORMAL HIGH (ref 11.5–15.5)
WBC: 15.2 10*3/uL — ABNORMAL HIGH (ref 4.0–10.5)
nRBC: 0.7 % — ABNORMAL HIGH (ref 0.0–0.2)

## 2019-08-24 MED ORDER — RESOURCE THICKENUP CLEAR PO POWD
ORAL | Status: DC | PRN
  Filled 2019-08-24 (×3): qty 125

## 2019-08-24 MED ORDER — FUROSEMIDE 10 MG/ML IJ SOLN
40.0000 mg | Freq: Two times a day (BID) | INTRAMUSCULAR | Status: AC
  Administered 2019-08-24 (×2): 40 mg via INTRAVENOUS
  Filled 2019-08-24 (×2): qty 4

## 2019-08-24 MED ORDER — OXYCODONE HCL 5 MG/5ML PO SOLN
5.0000 mg | Freq: Four times a day (QID) | ORAL | Status: DC | PRN
  Administered 2019-08-24 – 2019-08-25 (×2): 5 mg
  Filled 2019-08-24 (×2): qty 5

## 2019-08-24 NOTE — Progress Notes (Signed)
Modified Barium Swallow Progress Note  Patient Details  Name: Michael [name] MRN: [id number] Date of Birth: 07/12/1955  Today's Date: 08/24/2019  Modified Barium Swallow completed.  Full report located under Chart Review in the Imaging Section.  Brief recommendations include the following:  Clinical Impression Mr. Canela demonstrates an ongoing mild oral and moderate pharyngeal dysphagia. Pt with history of dysphagia, previously requiring altered diet, but had returned to normal diet/thin liquids prior to hospitalization. Oral phase is characterized by prolonged mastication, likely due to being edentulous. Despite edentulous state, pt had adequate mastication and bolus recollection for swallow. Pharyngeally, pt was noted with reduced hyoid excursion, laryngeal elevation, epiglottic inversion, laryngeal vestibule closure, and swallow trigger after liquids enter laryngeal vestibule, which resulted in aspiration in moderate amounts of thin liquids when taken via straw and in trace amounts when taken via cup. Timing was improved with use of nectar thick liquids, with trigger occurring in the valleculae. He is noted to be very impulsive, taking large sequential sips despite cues with all boluses. Pt had no aspiration of nectar thick liquids, but did have transient penetration noted. Honey thick liquids prevented penetration and aspiration, but given poor renal function and pt report of disliking honey thick liquids, recommend nectar thick as safest least restrictive option. Esophageal phase could not be visualized due to patient positioning, but no retrograde movement was observed to pass up through UES.  Recommend: nectar thick liquids, Dys 3 solids, small bites/sips, slow rate (will require cues/pacing), meds whole or crushed in puree as tolerated   Swallow Evaluation Recommendations   SLP Diet Recommendations: Nectar thick liquid;Dysphagia 3 (Mech soft) solids   Liquid Administration via:  Cup;Spoon;Straw   Medication Administration: Whole meds with puree   Supervision: Full supervision/cueing for compensatory strategies   Compensations: Small sips/bites;Slow rate   Postural Changes: Seated upright at 90 degrees   Oral Care Recommendations: Oral care BID   Other Recommendations: Order thickener from [place]. Marquies Wanat, M.S., CCC-SLP Speech-Language Pathologist Acute Rehabilitation Services Pager: [place] 08/24/2019,2:57 PM

## 2019-08-24 NOTE — Progress Notes (Signed)
Updated sister, [name], on patients current condition. All questions answered at this time.   Antonieta Pert, RN  08/24/2019 2030

## 2019-08-24 NOTE — Progress Notes (Signed)
NAME:  Michael [name], MRN:  [id number], DOB:  [birth year], LOS: 77 ADMISSION DATE:  08/07/2019, CONSULTATION DATE: 07/24/2019 REFERRING MD: EDP, CHIEF COMPLAINT: Altered mental status in the setting of hyperglycemia suspected pneumonia complicated by poor nutritional status and failure to thrive.  Brief History   64 year old with history of noncompliance insulin-dependent diabetes mellitus who presents 08/05/2019 with seizure activity,  altered mental status, hyperglycemia, and hypokalemic.  Pulmonary critical care asked to admit.  Past Medical History  Anxiety, OA, Bipolar, HA, DM type 2 with neuropathy, GERD, Hep B, Pancreatitis, Seizures  Significant Hospital Events   7/22 admit, AMS, blood sugar > 1400, seizure, aspiration, VDRF, pressors 7/23 off pressors 7/24 urology placed foley 7/25 reintubated due to worsening hypoxia 7/27 A fib with RVR - add amiodarone 7/29 change to pressure control  Consults:  7/22 neurology s/o 7/24 7/24 urology  Procedures:  Lt IJ CVL 7/22 >>  A-line 7/25 >>  ETT 7/25 >> 7/31  Significant Diagnostic Tests:   EEG 7/22 >> continuous slowing  CT head 7/22 >> atrophy, chronic small vessel ischemic changes, chronic thalamic lacunar infarcts  CT chest 7/22 >> multifocal pneumonia, changes of cirrhosis, small volume ascites, atherosclerosis  Echo 7/28 >> EF 60 to 65%, grade 1 DD  Micro Data:  7/22 blood culture x2>> 7/22 urine culture>> negative 7/22 COVID-19 testing>> negative 7/29 Sputum >>   Antimicrobials:  08/17/2019 vancomycin>> 7/23 08/13/2019 cefepime>> 7/25 08/17/2019 rocephin >> 7/28  Interim history/subjective:   Failed swallow evaluation, severe aspiration risk, remains n.p.o. Saturations have been adequate even though he takes his oxygen off frequently, is frequently on room air. Currently on high flow nasal cannula 15 L/min Complaining of some abdominal pain, pain at his rectal tube site  Objective   Blood pressure 103/67,  pulse 89, temperature (!) 96.5 F (35.8 C), temperature source Axillary, resp. rate (!) 30, height 5\' 4"  (1.626 m), weight 61.5 kg, SpO2 96 %.        Intake/Output Summary (Last 24 hours) at 08/24/2019 2876 Last data filed at 08/24/2019 0600 Gross per 24 hour  Intake 1034.15 ml  Output 950 ml  Net 84.15 ml   Filed Weights   08/22/19 0452 08/23/19 0445 08/24/19 0500  Weight: 61.3 kg 61.5 kg 61.5 kg    Examination:  General -chronically ill man, on room air (takes his oxygen off) Eyes -pupils equal ENT -oropharynx clear, poor dentition, no stridor, no secretions Cardiac regular, distant, no murmur Chest -coarse bilateral breath sounds, no wheezing Abdomen -nondistended, some diffuse tenderness to palpation, no rebound or guarding Extremities - 1+ lower extremity edema Skin -rash Neuro -follows commands, interacts, answers questions, moves all extremities GU -phimosis, scrotal edema    Resolved Hospital Problem list   Lactic acidosis, Hypernatremia, Hypokalemia, Hypoglycemia, Septic shock, Hypovolemic shock  Assessment & Plan:   Acute hypoxic respiratory failure from aspiration pneumonia. -Continue to wean high flow nasal cannula.  Has taken his oxygen off with some documented desaturations although not desaturated when I saw him on room air earlier -Follow intermittent chest x-ray -Goal initiate diuresis when renal function will allow >> furosemide 40 mg twice daily on 8/1  Acute metabolic encephalopathy 2nd to hypoxia. History of seizures with recurrent seizure in setting of hyperglycemia. Hx of Bipolar, schizophrenia, neuropathy. -Precedex off.  Careful with sedating medications -Continue Keppra -Continue Abilify, gabapentin  A fib with RVR on 7/27.  Echo reassuring -Remains in sinus rhythm off amiodarone  Thrombocytopenia.  Improving 7/30, 7/31 -Question due to  cefepime, Pepcid, infection -Antibiotics completed -Follow intermittent CBC  Anemia of critical  illness. -Following CBC as above - transfuse for Hb < 7 or significant bleeding  Hypokalemia. -Follow BMP, replace as indicated  Severe protein calorie malnutrition. Chronic pancreatitis. -Continue chronic Creon -Tube feeding per NG tube -Failed SLP eval on 7/31, MBS ordered and pending -Asking for pain medication, try low-dose oxycodone  Urine retention, phimosis. Continue Foley for now, was placed by urology due to his phimosis  DM type 2 poorly controlled with episodes of hyperglycemia and hypoglycemia. -Sliding-scale insulin as ordered -Levemir on hold  Best practice:  Diet: tube feeds DVT prophylaxis: SCDs GI prophylaxis: protonix Mobility: Bedrest Code Status: Full Disposition: You, should be able to transition out to progressive care when we have weaned his high flow nasal cannula successfully  Labs    CMP Latest Ref Rng & Units 08/24/2019 08/23/2019 08/22/2019  Glucose 70 - 99 mg/dL 197(H) 145(H) -  BUN 8 - 23 mg/dL 14 15 -  Creatinine 0.61 - 1.24 mg/dL 0.49(L) 0.51(L) -  Sodium 135 - 145 mmol/L 146(H) 146(H) 149(H)  Potassium 3.5 - 5.1 mmol/L 3.8 3.1(L) 3.0(L)  Chloride 98 - 111 mmol/L 111 111 -  CO2 22 - 32 mmol/L 29 29 -  Calcium 8.9 - 10.3 mg/dL 7.6(L) 7.5(L) -  Total Protein 6.5 - 8.1 g/dL - - -  Total Bilirubin 0.3 - 1.2 mg/dL - - -  Alkaline Phos 38 - 126 U/L - - -  AST 15 - 41 U/L - - -  ALT 0 - 44 U/L - - -    CBC Latest Ref Rng & Units 08/24/2019 08/23/2019 08/22/2019  WBC 4.0 - 10.5 K/uL 15.2(H) 16.2(H) -  Hemoglobin 13.0 - 17.0 g/dL 9.5(L) 9.6(L) 12.9(L)  Hematocrit 39 - 52 % 27.3(L) 27.6(L) 38.0(L)  Platelets 150 - 400 K/uL 87(L) 82(L) -    ABG    Component Value Date/Time   PHART 7.561 (H) 08/22/2019 1122   PCO2ART 34.4 08/22/2019 1122   PO2ART 51 (L) 08/22/2019 1122   HCO3 31.0 (H) 08/22/2019 1122   TCO2 32 08/22/2019 1122   ACIDBASEDEF 5.0 (H) 08/17/2019 0641   O2SAT 91.0 08/22/2019 1122    CBG (last 3)  Recent Labs    08/24/19 0208  08/24/19 0521 08/24/19 0753  GLUCAP 174* 168* 140*    Critical care time: 31 minutes   Baltazar Apo, MD, PhD 08/24/2019, 9:52 AM [facility] [phone] or if no answer [phone]

## 2019-08-24 NOTE — Progress Notes (Signed)
Assisted tele visit to patient with family member.  Ona Rathert Ann, RN  

## 2019-08-24 DEATH — deceased

## 2019-08-25 ENCOUNTER — Inpatient Hospital Stay (HOSPITAL_COMMUNITY): Payer: Medicare HMO

## 2019-08-25 LAB — BASIC METABOLIC PANEL
Anion gap: 7 (ref 5–15)
Anion gap: 11 (ref 5–15)
BUN: 15 mg/dL (ref 8–23)
BUN: 10 mg/dL (ref 8–23)
CO2: 28 mmol/L (ref 22–32)
CO2: 28 mmol/L (ref 22–32)
Calcium: 7.6 mg/dL — ABNORMAL LOW (ref 8.9–10.3)
Calcium: 7.7 mg/dL — ABNORMAL LOW (ref 8.9–10.3)
Chloride: 113 mmol/L — ABNORMAL HIGH (ref 98–111)
Chloride: 107 mmol/L (ref 98–111)
Creatinine, Ser: 0.55 mg/dL — ABNORMAL LOW (ref 0.61–1.24)
Creatinine, Ser: 0.63 mg/dL (ref 0.61–1.24)
GFR calc Af Amer: >60 mL/min (ref >60)
GFR calc Af Amer: >60 mL/min (ref >60)
GFR calc non Af Amer: >60 mL/min (ref >60)
GFR calc non Af Amer: >60 mL/min (ref >60)
Glucose, Bld: 154 mg/dL — ABNORMAL HIGH (ref 70–99)
Glucose, Bld: 225 mg/dL — ABNORMAL HIGH (ref 70–99)
Potassium: 2.7 mmol/L — CL (ref 3.5–5.1)
Potassium: 3.4 mmol/L — ABNORMAL LOW (ref 3.5–5.1)
Sodium: 148 mmol/L — ABNORMAL HIGH (ref 135–145)
Sodium: 146 mmol/L — ABNORMAL HIGH (ref 135–145)

## 2019-08-25 LAB — CBC
HCT: 26.2 % — ABNORMAL LOW (ref 39.0–52.0)
Hemoglobin: 8.8 g/dL — ABNORMAL LOW (ref 13.0–17.0)
MCH: 31.3 pg (ref 26.0–34.0)
MCHC: 33.6 g/dL (ref 30.0–36.0)
MCV: 93.2 fL (ref 80.0–100.0)
Platelets: 90 10*3/uL — ABNORMAL LOW (ref 150–400)
RBC: 2.81 MIL/uL — ABNORMAL LOW (ref 4.22–5.81)
RDW: 17.4 % — ABNORMAL HIGH (ref 11.5–15.5)
WBC: 13.5 10*3/uL — ABNORMAL HIGH (ref 4.0–10.5)
nRBC: 0.4 % — ABNORMAL HIGH (ref 0.0–0.2)

## 2019-08-25 LAB — GLUCOSE, CAPILLARY
Glucose-Capillary: 168 mg/dL — ABNORMAL HIGH (ref 70–99)
Glucose-Capillary: 110 mg/dL — ABNORMAL HIGH (ref 70–99)
Glucose-Capillary: 183 mg/dL — ABNORMAL HIGH (ref 70–99)
Glucose-Capillary: 219 mg/dL — ABNORMAL HIGH (ref 70–99)
Glucose-Capillary: 280 mg/dL — ABNORMAL HIGH (ref 70–99)

## 2019-08-25 LAB — MAGNESIUM: Magnesium: 1.7 mg/dL (ref 1.7–2.4)

## 2019-08-25 LAB — PHOSPHORUS: Phosphorus: 3.2 mg/dL (ref 2.5–4.6)

## 2019-08-25 MED ORDER — DOCUSATE SODIUM 50 MG/5ML PO LIQD: 100.0000 mg | Freq: Every day | ORAL | Status: DC | PRN

## 2019-08-25 MED ORDER — MAGNESIUM SULFATE 2 GM/50ML IV SOLN
2.0000 g | Freq: Once | INTRAVENOUS | Status: AC
  Administered 2019-08-25: 2 g via INTRAVENOUS
  Filled 2019-08-25: qty 50

## 2019-08-25 MED ORDER — POLYETHYLENE GLYCOL 3350 17 G PO PACK: 17.0000 g | PACK | Freq: Every day | ORAL | Status: DC | PRN

## 2019-08-25 MED ORDER — POTASSIUM CHLORIDE 10 MEQ/50ML IV SOLN
10.0000 meq | INTRAVENOUS | Status: DC
  Administered 2019-08-25: 10 meq via INTRAVENOUS
  Filled 2019-08-25 (×3): qty 50

## 2019-08-25 MED ORDER — POTASSIUM CHLORIDE 10 MEQ/50ML IV SOLN
10.0000 meq | INTRAVENOUS | Status: AC
  Administered 2019-08-25 (×3): 10 meq via INTRAVENOUS
  Filled 2019-08-25 (×2): qty 50

## 2019-08-25 MED ORDER — ENSURE ENLIVE PO LIQD
237.0000 mL | Freq: Three times a day (TID) | ORAL | Status: DC
  Administered 2019-08-25 – 2019-08-30 (×15): 237 mL via ORAL

## 2019-08-25 MED ORDER — POTASSIUM CHLORIDE CRYS ER 20 MEQ PO TBCR
40.0000 meq | EXTENDED_RELEASE_TABLET | Freq: Once | ORAL | Status: AC
  Administered 2019-08-25: 40 meq via ORAL
  Filled 2019-08-25: qty 2

## 2019-08-25 MED ORDER — NICOTINE 21 MG/24HR TD PT24
21.0000 mg | MEDICATED_PATCH | Freq: Every day | TRANSDERMAL | Status: DC
  Administered 2019-08-25 – 2019-09-01 (×8): 21 mg via TRANSDERMAL
  Filled 2019-08-25 (×8): qty 1

## 2019-08-25 MED ORDER — GABAPENTIN 300 MG PO CAPS
300.0000 mg | ORAL_CAPSULE | Freq: Two times a day (BID) | ORAL | Status: DC
  Administered 2019-08-25 – 2019-09-01 (×16): 300 mg via ORAL
  Filled 2019-08-25 (×16): qty 1

## 2019-08-25 MED ORDER — DOCUSATE SODIUM 100 MG PO CAPS: 100.0000 mg | ORAL_CAPSULE | Freq: Every day | ORAL | Status: DC | PRN

## 2019-08-25 MED ORDER — ARIPIPRAZOLE 2 MG PO TABS
1.0000 mg | ORAL_TABLET | Freq: Every day | ORAL | Status: DC
  Administered 2019-08-25 – 2019-09-01 (×8): 1 mg via ORAL
  Filled 2019-08-25 (×9): qty 1

## 2019-08-25 MED ORDER — GABAPENTIN 250 MG/5ML PO SOLN
300.0000 mg | Freq: Two times a day (BID) | ORAL | Status: DC
  Filled 2019-08-25: qty 6

## 2019-08-25 MED ORDER — LEVETIRACETAM 500 MG PO TABS
500.0000 mg | ORAL_TABLET | Freq: Two times a day (BID) | ORAL | Status: DC
  Administered 2019-08-25 – 2019-09-01 (×16): 500 mg via ORAL
  Filled 2019-08-25 (×18): qty 1

## 2019-08-25 MED ORDER — POTASSIUM CHLORIDE CRYS ER 20 MEQ PO TBCR
40.0000 meq | EXTENDED_RELEASE_TABLET | Freq: Once | ORAL | Status: DC
  Filled 2019-08-25: qty 2

## 2019-08-25 MED ORDER — OXYCODONE HCL 5 MG PO TABS
5.0000 mg | ORAL_TABLET | Freq: Four times a day (QID) | ORAL | Status: DC | PRN
  Administered 2019-08-25 – 2019-08-28 (×4): 5 mg via ORAL
  Filled 2019-08-25 (×4): qty 1

## 2019-08-25 MED ORDER — LEVETIRACETAM 100 MG/ML PO SOLN
500.0000 mg | Freq: Two times a day (BID) | ORAL | Status: DC
  Filled 2019-08-25: qty 10

## 2019-08-25 MED ORDER — OXYCODONE HCL 5 MG/5ML PO SOLN
5.0000 mg | Freq: Four times a day (QID) | ORAL | Status: DC | PRN
  Filled 2019-08-25: qty 5

## 2019-08-25 MED ORDER — FUROSEMIDE 10 MG/ML IJ SOLN
40.0000 mg | Freq: Once | INTRAMUSCULAR | Status: AC
  Administered 2019-08-25: 40 mg via INTRAVENOUS
  Filled 2019-08-25: qty 4

## 2019-08-25 MED ORDER — PANTOPRAZOLE SODIUM 40 MG PO TBEC: 40.0000 mg | DELAYED_RELEASE_TABLET | Freq: Every day | ORAL | Status: DC

## 2019-08-25 NOTE — Evaluation (Signed)
Physical Therapy Evaluation Patient Details Name: Michael [name] MRN: [id number] DOB: 10-Feb-1955 Today's Date: 08/25/2019   History of Present Illness  64 year old man with history of bipolar disease, schizophrenia, chronic pancreatitis, hep B, diabetes.  He was admitted with encephalopathy and pneumonia, required intubation, mechanical ventilation, pressors.  Course complicated by atrial fibrillation.  He was extubated 7/31  Clinical Impression  Pt admitted with/for encephalopathy with PNA.  Pt now extubated and confused with .  Pt currently limited functionally due to the problems listed below.  (see problems list.)  Pt will benefit from PT to maximize function and safety to be able to get home safely with available assist.     Follow Up Recommendations Home health PT;Supervision/Assistance - 24 hour    Equipment Recommendations  None recommended by PT    Recommendations for Other Services       Precautions / Restrictions Precautions Precautions: Fall      Mobility  Bed Mobility Overal bed mobility: Needs Assistance Bed Mobility: Supine to Sit;Sit to Supine     Supine to sit: Min assist Sit to supine: Min guard   General bed mobility comments: cues for direction, minimal stability assist to stay forward while he came to the EOB  Transfers Overall transfer level: Needs assistance   Transfers: Sit to/from Stand;Stand Pivot Transfers Sit to Stand: Min assist Stand pivot transfers: Min assist       General transfer comment: cues for hand placement, stability assist  Ambulation/Gait Ambulation/Gait assistance: Min assist Gait Distance (Feet): 3 Feet (Forward and back x3) Assistive device: 1 person hand held assist Gait Pattern/deviations: Step-through pattern   Gait velocity interpretation: <1.31 ft/sec, indicative of household ambulator General Gait Details: mostly stepped forward/back or marched in place with minimal HHA  Stairs            Wheelchair  Mobility    Modified Rankin (Stroke Patients Only)       Balance Overall balance assessment: Needs assistance Sitting-balance support: Single extremity supported;Bilateral upper extremity supported;No upper extremity supported Sitting balance-Leahy Scale: Fair   Postural control: Posterior lean Standing balance support: Single extremity supported;During functional activity Standing balance-Leahy Scale: Poor Standing balance comment: posterior lean                             Pertinent Vitals/Pain Pain Assessment: Faces Faces Pain Scale: No hurt Pain Intervention(s): Monitored during session    Home Living Family/patient expects to be discharged to:: Private residence Living Arrangements: Other relatives;Other (Comment) (sister and niece) Available Help at Discharge: Family Type of Home: House Home Access: Stairs to enter Entrance Stairs-Rails: Right Entrance Stairs-Number of Steps: 3 Home Layout: One level Home Equipment: Walker - 2 wheels;Cane - single point      Prior Function Level of Independence: Independent with assistive device(s)         Comments: reports using RW for mobility to prevent falls, indepenent basic ADls      Hand Dominance   Dominant Hand: Left    Extremity/Trunk Assessment   Upper Extremity Assessment Upper Extremity Assessment: Generalized weakness;Overall [facility] for tasks assessed    Lower Extremity Assessment Lower Extremity Assessment: Generalized weakness;Overall WFL for tasks assessed       Communication      Cognition Arousal/Alertness: Awake/alert Behavior During Therapy: Restless;WFL for tasks assessed/performed Overall Cognitive Status: No family/caregiver present to determine baseline cognitive functioning (NT formally, follows simple commands, needs redirection)  General Comments General comments (skin integrity, edema, etc.): sats on 8L hovering around  91/92%, HR 90's    Exercises Other Exercises Other Exercises: warm up ROM exercise   Assessment/Plan    PT Assessment Patient needs continued PT services  PT Problem List Decreased strength;Decreased activity tolerance;Decreased balance;Decreased mobility;Cardiopulmonary status limiting activity;Pain;Decreased knowledge of use of DME       PT Treatment Interventions DME instruction;Gait training;Stair training;Functional mobility training;Therapeutic activities;Balance training;Patient/family education    PT Goals (Current goals can be found in the Care Plan section)  Acute Rehab PT Goals Patient Stated Goal: pt unable at this time PT Goal Formulation: Patient unable to participate in goal setting Time For Goal Achievement: 09/08/19 Potential to Achieve Goals: Good    Frequency Min 3X/week   Barriers to discharge        Co-evaluation               AM-PAC PT "6 Clicks" Mobility  Outcome Measure Help needed turning from your back to your side while in a flat bed without using bedrails?: None Help needed moving from lying on your back to sitting on the side of a flat bed without using bedrails?: A Little Help needed moving to and from a bed to a chair (including a wheelchair)?: A Little Help needed standing up from a chair using your arms (e.g., wheelchair or bedside chair)?: A Little Help needed to walk in hospital room?: A Little Help needed climbing 3-5 steps with a railing? : A Little 6 Click Score: 19    End of Session Equipment Utilized During Treatment: Oxygen Activity Tolerance: Patient tolerated treatment well Patient left: in bed;with call bell/phone within reach;with bed alarm set Nurse Communication: Mobility status PT Visit Diagnosis: Unsteadiness on feet (R26.81);Other abnormalities of gait and mobility (R26.89);Difficulty in walking, not elsewhere classified (R26.2)    Time: 6168-3729 PT Time Calculation (min) (ACUTE ONLY): 23 min   Charges:   PT  Evaluation $PT Eval Moderate Complexity: 1 Mod PT Treatments $Therapeutic Activity: 8-22 mins        08/25/2019  Ginger Carne., PT Acute Rehabilitation Services [phone]  (pager) [phone]  (office)  Tessie Fass Ceanna Wareing 08/25/2019, 4:58 PM

## 2019-08-25 NOTE — Progress Notes (Signed)
[facility] Progress Note Patient Name: Michael [name] DOB: 10/28/1955 MRN: [id number]   Date of Service  08/25/2019  HPI/Events of Note  Patient asking for cigarettes.  eICU Interventions  Plan: 1. Nicotine patch 21 mg to skin now and Q day.     Intervention Category Major Interventions: Other:  Darleny Sem Cornelia Copa 08/25/2019, 2:03 AM

## 2019-08-25 NOTE — Progress Notes (Signed)
CRITICAL VALUE ALERT  Critical Value:  K+ 2.7  Date & Time Notied:  08/25/2019 1696  Provider Notified: Oletta Darter, MD notified  Orders Received/Actions taken: Awaiting orders.   Antonieta Pert, RN  08/25/2019 [phone]

## 2019-08-25 NOTE — Progress Notes (Signed)
NAME:  Michael [name], MRN:  [id number], DOB:  1955-06-20, LOS: 31 ADMISSION DATE:  08/12/2019, CONSULTATION DATE: 07/28/2019 REFERRING MD: EDP, CHIEF COMPLAINT: Altered mental status in the setting of hyperglycemia suspected pneumonia complicated by poor nutritional status and failure to thrive.  Brief History   64 year old with history of noncompliance insulin-dependent diabetes mellitus who presents 08/20/2019 with seizure activity,  altered mental status, hyperglycemia, and hypokalemic.  Pulmonary critical care asked to admit.  Past Medical History  Anxiety, OA, Bipolar, HA, DM type 2 with neuropathy, GERD, Hep B, Pancreatitis, Seizures  Significant Hospital Events   7/22 admit, AMS, blood sugar > 1400, seizure, aspiration, VDRF, pressors 7/23 off pressors 7/24 urology placed foley 7/25 reintubated due to worsening hypoxia 7/27 A fib with RVR - add amiodarone 7/29 change to pressure control 8/2 transferred out of ICU  Consults:  7/22 neurology s/o 7/24 7/24 urology  Procedures:  Lt IJ CVL 7/22 >>  A-line 7/25 >>  ETT 7/25 >> 7/31  Significant Diagnostic Tests:   EEG 7/22 >> continuous slowing  CT head 7/22 >> atrophy, chronic small vessel ischemic changes, chronic thalamic lacunar infarcts  CT chest 7/22 >> multifocal pneumonia, changes of cirrhosis, small volume ascites, atherosclerosis  Echo 7/28 >> EF 60 to 65%, grade 1 DD  Micro Data:  7/22 blood culture x2>> 7/22 urine culture>> negative 7/22 COVID-19 testing>> negative 7/29 Sputum >>   Antimicrobials:  08/15/2019 vancomycin>> 7/23 07/29/2019 cefepime>> 7/25 08/17/2019 rocephin >> 7/28  Interim history/subjective:  No complaints.  "I feel good!" Remains on HFNC.  Objective   Blood pressure (!) 95/55, pulse 86, temperature (!) 95.2 F (35.1 C), temperature source Temporal, resp. rate 21, height 5\' 4"  (1.626 m), weight 61.3 kg, SpO2 96 %.        Intake/Output Summary (Last 24 hours) at 08/25/2019 0734 Last  data filed at 08/25/2019 0700 Gross per 24 hour  Intake 399.96 ml  Output 1300 ml  Net -900.04 ml   Filed Weights   08/23/19 0445 08/24/19 0500 08/25/19 0450  Weight: 61.5 kg 61.5 kg 61.3 kg    Examination: General: Adult male, frail, resting in bed, in NAD. Neuro: A&O x 3, no deficits. HEENT: [place]/AT. Sclerae anicteric. EOMI.  HFNC in place. Cardiovascular: RRR, no M/R/G.  Lungs: Respirations even and unlabored.  CTA bilaterally, No W/R/R. Abdomen: BS x 4, soft, NT/ND.  Musculoskeletal: No gross deformities, no edema.  Skin: Intact, warm, no rashes. GU: phimosis, scrotal edema   Assessment & Plan:   Acute hypoxic respiratory failure from aspiration pneumonia s/p 7 days abx. - Continue to wean high flow nasal cannula as able (he has had episodes where he will intermittently take his O2 off and will subsequently desaturate). - Follow intermittent chest x-ray. - Additional 40mg  BID today.   Acute metabolic encephalopathy 2nd to hypoxia - improved. History of seizures with recurrent seizure in setting of hyperglycemia. Hx of Bipolar, schizophrenia, neuropathy. - Continue Keppra, Abilify, gabapentin. - Avoid sedating meds.  A fib with RVR on 7/27.  Now Remains in sinus rhythm off amiodarone.  Echo reassuring. - Supportive care.  Thrombocytopenia - gradually improving with no signs of any bleeding.  Question due to cefepime, Pepcid, infection. - Monitor.  Anemia of critical illness. - Transfuse for Hb < 7 or significant bleeding.  Hypokalemia - being repleted. - Follow BMP.  Hypernatremia. - Continue free water per tube.  Severe protein calorie malnutrition. Chronic pancreatitis. - Continue dysphagia 3 diet. - Continue chronic Creon.  Urine retention, phimosis - had foley placed by urology which was unfortunately dislodged overnight 8/1. - Continue condom cath for now and monitor for needs of foley re-insertion.  DM type 2 poorly controlled with episodes of  hyperglycemia and hypoglycemia. - Sliding-scale insulin as ordered. - Levemir on hold.   Stable for transfer out of ICU.  Will ask TRH to assume care in AM 8/3 with PCCM off at that time.  Best practice:  Diet: D3. DVT prophylaxis: SCDs GI prophylaxis: None Mobility: Bedrest Code Status: Full Disposition: Transfer to progressive 8/2 and ask TRH to assume care AM 8/3   Montey Hora, [facility] 08/25/2019, 7:57 AM

## 2019-08-25 NOTE — Progress Notes (Signed)
Nutrition Follow-up  DOCUMENTATION CODES:   Severe malnutrition in context of chronic illness  INTERVENTION:   Ensure Enlive po TID with meals, each supplement provides 350 kcal and 20 grams of protein  Encourage PO intake   NUTRITION DIAGNOSIS:   Severe Malnutrition related to chronic illness (poorly controlled T2DM, pancreatitis) as evidenced by moderate fat depletion, moderate muscle depletion, severe muscle depletion, percent weight loss (10.4% weight loss in 4 months).  Ongoing  GOAL:   Patient will meet greater than or equal to 90% of their needs  Progressing.   MONITOR:   PO intake, Supplement acceptance  REASON FOR ASSESSMENT:   Ventilator    ASSESSMENT:   64 year old male who presented to the ED on 7/22 with respiratory distress after being found unresponsive on the floor by family. Pt had multiple seizures in the field. PMH of bipolar disorder, T2DM (poorly compliant with insulin regimen), GERD, pancreatitis, schizophrenia, seizures. Pt required intubation for airway protection.  Spoke with RN. Pt agitated wanting to leave AMA.  Pt advanced to Dysphagia 3 diet with Nectar thickened liquids. Per SLP notes honey thickened liquids preferred but pt dislikes them. Per RN pt not eating much.    7/23 - trickle TF initiated, extubated 7/25 - intubated 8/1 - self-extubated  EDW: 49.5 kg  Medications reviewed and include: creon TID with meals K+ runs IV  Labs reviewed: K+ 2.7     Diet Order:   Diet Order            DIET DYS 3 Room service appropriate? Yes with Assist; Fluid consistency: Nectar Thick  Diet effective now                 EDUCATION NEEDS:   No education needs have been identified at this time  Skin:  Skin Assessment: Reviewed RN Assessment (MASD to groin)  Last BM:  8/1  Height:   Ht Readings from Last 1 Encounters:  08/03/2019 5\' 4"  (1.626 m)    Weight:   Wt Readings from Last 1 Encounters:  08/25/19 61.3 kg    Ideal Body  Weight:  59.1 kg  BMI:  Body mass index is 23.2 kg/m.  Estimated Nutritional Needs:   Kcal:  1700-1900  Protein:  75-90 grams  Fluid:  >1.7 L/day  [name]., RD, LDN, CNSC See AMiON for contact information

## 2019-08-25 NOTE — Progress Notes (Signed)
[facility] Progress Note Patient Name: MAHER SHON DOB: Jun 27, 1955 MRN: [id number]   Date of Service  08/25/2019  HPI/Events of Note  Hypothermia - Request for [facility].   eICU Interventions  Will order Bair Hugger PRN.     Intervention Category Major Interventions: Other:  Petina Muraski Cornelia Copa 08/25/2019, 4:07 AM

## 2019-08-25 NOTE — Consult Note (Signed)
Urology Consult   Physician requesting consult: Baltazar Apo, MD  Reason for consult: Phimosis, urinary retention  History of Present Illness: Michael [name] is a 64 y.o. male admitted with altered mental status for whom urology is consulted for replacement of foley catheter in the setting of phimosis and urinary retention.  Urology was previously consulted on 7/24 for foley placement. A 12 Fr catheter was placed at the time secondary to phimosis. Since then, patient has had leaking around the catheter and increased edema of his foreskin. This morning, catheter was noted to be out when nursing attempted to flush catheter. Patient was recently extubated and has been somewhat more alert than previously. He did have some small voids today, but most recent bladder scan was for 617cc. He has an external catheter in place. He feels the need to urinate but is unable to do so.   Past Medical History:  Diagnosis Date  . Anxiety   . Arthritis    "joints; shoulders; feet" (06/08/2016)  . Bipolar disorder ([place])   . Daily headache    "7:30 - 8:00 q night" (06/08/2016)  . Diabetic peripheral neuropathy ([place])   . GERD (gastroesophageal reflux disease)   . History of hepatitis B virus infection conferring immunity 03/22/2018  . Pancreatitis   . Schizophrenia ([place])   . Seizures ([place]) 01/2016 X 2  . Type II diabetes mellitus ([place])     Past Surgical History:  Procedure Laterality Date  . BALLOON DILATION  03/22/2011   Procedure: BALLOON DILATION;  Surgeon: Gatha Mayer, MD;  Location: [facility];  Service: Endoscopy;  Laterality: N/A;  . COLONOSCOPY N/A 05/22/2012   Procedure: COLONOSCOPY;  Surgeon: Gatha Mayer, MD;  Location: [facility];  Service: Endoscopy;  Laterality: N/A;  . COLONOSCOPY W/ BIOPSIES AND POLYPECTOMY  09/12/11  . ESOPHAGOGASTRODUODENOSCOPY  03/22/2011   Procedure: ESOPHAGOGASTRODUODENOSCOPY (EGD);  Surgeon: Gatha Mayer, MD;  Location: [facility];  Service: Endoscopy;   Laterality: N/A;  egd with balloon   . EUS  04/20/2011   Procedure: UPPER ENDOSCOPIC ULTRASOUND (EUS) LINEAR;  Surgeon: Milus Banister, MD;  Location: [facility];  Service: Endoscopy;  Laterality: N/A;  . KNEE ARTHROSCOPY Left     Current Hospital Medications:  Home Meds:  No current facility-administered medications on file prior to encounter.   Current Outpatient Medications on File Prior to Encounter  Medication Sig Dispense Refill  . albuterol (VENTOLIN HFA) 108 (90 Base) MCG/ACT inhaler Inhale 1 puff into the lungs every 4 (four) hours as needed for wheezing or shortness of breath.    . ARIPiprazole (ABILIFY) 2 MG tablet Take 2 mg by mouth at bedtime.     . BELBUCA 450 MCG FILM 450 mcg by Transmucosal route every 12 (twelve) hours.    . DULoxetine (CYMBALTA) 30 MG capsule Take 30 mg by mouth daily.    . feeding supplement, GLUCERNA SHAKE, (GLUCERNA SHAKE) LIQD Take 237 mLs by mouth 3 (three) times daily between meals. 90 Can 2  . folic acid (FOLVITE) 1 MG tablet Take 1 tablet (1 mg total) by mouth daily. 30 tablet 5  . gabapentin (NEURONTIN) 600 MG tablet Take 600 mg by mouth 3 (three) times daily.     . hydrOXYzine (ATARAX/VISTARIL) 25 MG tablet Take 25 mg by mouth 2 (two) times daily as needed for anxiety.     Marland Kitchen ibuprofen (ADVIL,MOTRIN) 800 MG tablet Take 800 mg by mouth 3 (three) times daily as needed for mild pain.     Marland Kitchen  insulin aspart (NOVOLOG) 100 UNIT/ML FlexPen Take 5 units before breakfast,   before lunch, and 5 units before dinner (5 units before each meal) (Patient taking differently: Inject 10 Units into the skin 3 (three) times daily with meals. ) 3 mL 3  . LEVEMIR FLEXTOUCH 100 UNIT/ML Pen 4 units Twice a day (Patient taking differently: Inject 16 Units into the skin 2 (two) times daily. ) 15 mL 0  . lipase/protease/amylase (CREON) 36000 UNITS CPEP capsule TAKE 1 CAPSULE BY MOUTH 3  TIMES DAILY BEFORE MEALS (Patient taking differently: Take 36,000 Units by mouth 3  (three) times daily before meals. ) 300 capsule 1  . magnesium citrate SOLN Take 2.5 mLs by mouth daily as needed for mild constipation or moderate constipation.     . naloxone (NARCAN) nasal spray 4 mg/0.1 mL Place 1 spray into the nose daily as needed (opioid reversal).     Marland Kitchen oxyCODONE-acetaminophen (PERCOCET) 10-325 MG tablet Take 1 tablet by mouth every 8 (eight) hours as needed for pain.    . pantoprazole (PROTONIX) 40 MG tablet Take 1 tablet (40 mg total) by mouth daily. (Patient taking differently: Take 40 mg by mouth daily as needed (GERD). ) 30 tablet 5  . Vitamin D, Ergocalciferol, (DRISDOL) 1.25 MG (50000 UNIT) CAPS capsule Take 50,000 Units by mouth once a week.    Marland Kitchen ACCU-CHEK SOFTCLIX LANCETS lancets Use 3 times daily to check blood sugar. diag code E11.42. Insulin dependent 100 each 5  . Alcohol Swabs (B-D SINGLE USE SWABS REGULAR) PADS Patient uses 6 times a day. Patient tests 3 times per day and administers insulin 3 times a day. ICD 10 code E11.42 200 each 5  . Blood Glucose Calibration (ACCU-CHEK AVIVA) SOLN 1 drop by In Vitro route as needed. diag code E08.10 insulin dependent. Use as device directs 1 each 4  . Blood Glucose Monitoring Suppl (ACCU-CHEK AVIVA PLUS) w/Device KIT 1 kit by Does not apply route every morning. diag code E11.42. Insulin dependent 1 kit 0  . glucose blood (ACCU-CHEK AVIVA PLUS) test strip Use 3 times daily to check blood sugar. diag code E11.42. Insulin dependent 300 each 12     Scheduled Meds: . ARIPiprazole  1 mg Oral QHS  . chlorhexidine gluconate (MEDLINE KIT)  15 mL Mouth Rinse BID  . Chlorhexidine Gluconate Cloth  6 each Topical Q0600  . free water  100 mL Per Tube Q8H  . gabapentin  300 mg Oral BID  . insulin aspart  0-9 Units Subcutaneous Q4H  . levETIRAcetam  500 mg Oral BID  . lipase/protease/amylase  36,000 Units Oral TID AC  . nicotine  21 mg Transdermal Daily  . sodium chloride flush  10-40 mL Intracatheter Q12H   Continuous  Infusions: . sodium chloride 10 mL/hr at 08/25/19 0640   PRN Meds:.Place/Maintain arterial line **AND** sodium chloride, albuterol, docusate sodium, oxyCODONE, polyethylene glycol, Resource ThickenUp Clear, sodium chloride flush  Allergies:  Allergies  Allergen Reactions  . Ace Inhibitors Swelling and Other (See Comments)    Angioedema - unquestionable  . Losartan Swelling and Other (See Comments)    Pt presented with unquestionable angioedema on ACEIs (Angiotensin-converting enzyme (ACE) inhibitors) so aviod ARBs (Angiotensin receptor blockers), as well  . Tylenol [Acetaminophen] Other (See Comments)    "cannot have this" (takes Percocet at home)    Family History  Problem Relation Age of Onset  . Hypertension Sister   . Diabetes Sister   . Heart disease Sister   .  Colon cancer Neg Hx   . Stomach cancer Neg Hx   . Anesthesia problems Neg Hx   . Hypotension Neg Hx   . Pseudochol deficiency Neg Hx   . Malignant hyperthermia Neg Hx     Social History:  reports that he has been smoking cigarettes. He has a 21.00 pack-year smoking history. He has never used smokeless tobacco. He reports current alcohol use of about 2.0 standard drinks of alcohol per week. He reports previous drug use. Drug: Marijuana.  ROS: A complete review of systems was performed.  All systems are negative except for pertinent findings as noted.  Physical Exam:  Vital signs in last 24 hours: Temp:  [92.5 F (33.6 C)-97.5 F (36.4 C)] 97.5 F (36.4 C) (08/02 1153) Pulse Rate:  [78-180] 93 (08/02 1200) Resp:  [17-31] 22 (08/02 1200) BP: (69-131)/(54-106) 126/61 (08/02 1200) SpO2:  [85 %-100 %] 92 % (08/02 1200) Weight:  [61.3 kg] 61.3 kg (08/02 0450) Constitutional:  Lying in bed, responsive, conversational Cardiovascular: Regular rate and rhythm Respiratory: Normal respiratory effort GI: Abdomen is soft, nontender, nondistended, no abdominal masses GU: Phimosis with edematous foreskin, able to retract  slightly, far enough to visualize meatus. Small abrasion on outer foreskin. Meatus appears normal without lesions.  Lymphatic: No lymphadenopathy Psychiatric: Normal mood and affect  Laboratory Data:  Recent Labs    08/23/19 0447 08/24/19 0225 08/25/19 0500  WBC 16.2* 15.2* 13.5*  HGB 9.6* 9.5* 8.8*  HCT 27.6* 27.3* 26.2*  PLT 82* 87* 90*    Recent Labs    08/23/19 0447 08/24/19 0225 08/25/19 0500  NA 146* 146* 148*  K 3.1* 3.8 2.7*  CL 111 111 113*  GLUCOSE 145* 197* 154*  BUN _0 CALCIUM 7.5* 7.6* 7.6*  CREATININE 0.51* 0.49* 0.55*   Estimated Creatinine Clearance: 81.2 mL/min (A) (by C-G formula based on SCr of 0.55 mg/dL (L)).  Procedure 08/25/19:  Patient was prepped and draped in the usual sterile manner. Manual pressure was applied to foreskin to reduce edema. Foreskin was then slightly retracted to reveal urethral meatus. A 14 Fr foley catheter was then placed, without any resistance in the urethra. 10cc of sterile water was placed in the balloon. There was return of clear yellow urine. Patient tolerated the procedure well.   Impression/Recommendation 64 y.o. male with phimosis and urinary retention in the setting of altered mental status.  1. Continue foley catheter until patient more alert and ambulatory. 2. Recommend scrotal/penile elevation with cloth/towel to reduce edema 3. Please contact urologyWill schedule patient for outpatient follow up with urology for discussion of phimosis   Carmie Kanner 08/25/2019, 1:26 PM

## 2019-08-25 NOTE — Progress Notes (Signed)
Pharmacy Electrolyte Replacement  Recent Labs:  Recent Labs    08/25/19 0500  K 2.7*  MG 1.7  PHOS 3.2  CREATININE 0.55*    Low Critical Values (K </= 2.5, Phos </= 1, Mg </= 1) Present: None   Plan: KCl 72mEq PO x1 and KCl 8mEq IV x3 (already given 31mEq)

## 2019-08-25 NOTE — Progress Notes (Signed)
Patient has had a foley catheter placed since 08/16/19 inserted by Urology - due to retention and phimosis. There have been issues over the past week where urine has been leaking around the catheter. The balloon had been checked multiple times with normal amount found each time. It has leaked throughout the night and it has been assessed it each time. When assessing the foley catheter again at 0415, the catheter had come out completely. E-link has been notified. I will place an external catheter at this time and will allow the day treatment team to decide further plans.   Antonieta Pert, RN  08/25/2019 [phone]

## 2019-08-26 DIAGNOSIS — J9600 Acute respiratory failure, unspecified whether with hypoxia or hypercapnia: Secondary | ICD-10-CM | POA: Diagnosis not present

## 2019-08-26 LAB — GLUCOSE, CAPILLARY
Glucose-Capillary: 136 mg/dL — ABNORMAL HIGH (ref 70–99)
Glucose-Capillary: 136 mg/dL — ABNORMAL HIGH (ref 70–99)
Glucose-Capillary: 258 mg/dL — ABNORMAL HIGH (ref 70–99)
Glucose-Capillary: 278 mg/dL — ABNORMAL HIGH (ref 70–99)
Glucose-Capillary: 305 mg/dL — ABNORMAL HIGH (ref 70–99)
Glucose-Capillary: 33 mg/dL — CL (ref 70–99)
Glucose-Capillary: 33 mg/dL — CL (ref 70–99)
Glucose-Capillary: 38 mg/dL — CL (ref 70–99)
Glucose-Capillary: 53 mg/dL — ABNORMAL LOW (ref 70–99)
Glucose-Capillary: 53 mg/dL — ABNORMAL LOW (ref 70–99)
Glucose-Capillary: 88 mg/dL (ref 70–99)
Glucose-Capillary: 88 mg/dL (ref 70–99)
Glucose-Capillary: 89 mg/dL (ref 70–99)
Glucose-Capillary: 89 mg/dL (ref 70–99)

## 2019-08-26 LAB — CBC
HCT: 25.6 % — ABNORMAL LOW (ref 39.0–52.0)
Hemoglobin: 8.5 g/dL — ABNORMAL LOW (ref 13.0–17.0)
MCH: 31.4 pg (ref 26.0–34.0)
MCHC: 33.2 g/dL (ref 30.0–36.0)
MCV: 94.5 fL (ref 80.0–100.0)
Platelets: 88 10*3/uL — ABNORMAL LOW (ref 150–400)
RBC: 2.71 MIL/uL — ABNORMAL LOW (ref 4.22–5.81)
RDW: 18.2 % — ABNORMAL HIGH (ref 11.5–15.5)
WBC: 11.7 10*3/uL — ABNORMAL HIGH (ref 4.0–10.5)
nRBC: 0.8 % — ABNORMAL HIGH (ref 0.0–0.2)

## 2019-08-26 LAB — MAGNESIUM: Magnesium: 2 mg/dL (ref 1.7–2.4)

## 2019-08-26 LAB — BASIC METABOLIC PANEL
Anion gap: 10 (ref 5–15)
BUN: 11 mg/dL (ref 8–23)
CO2: 30 mmol/L (ref 22–32)
Calcium: 7.5 mg/dL — ABNORMAL LOW (ref 8.9–10.3)
Chloride: 107 mmol/L (ref 98–111)
Creatinine, Ser: 0.4 mg/dL — ABNORMAL LOW (ref 0.61–1.24)
GFR calc Af Amer: >60 mL/min (ref >60)
GFR calc non Af Amer: >60 mL/min (ref >60)
Glucose, Bld: 89 mg/dL (ref 70–99)
Potassium: 3.4 mmol/L — ABNORMAL LOW (ref 3.5–5.1)
Sodium: 147 mmol/L — ABNORMAL HIGH (ref 135–145)

## 2019-08-26 LAB — PHOSPHORUS: Phosphorus: 4.2 mg/dL (ref 2.5–4.6)

## 2019-08-26 MED ORDER — POTASSIUM CHLORIDE 20 MEQ PO PACK
40.0000 meq | PACK | Freq: Once | ORAL | Status: AC
  Administered 2019-08-26: 40 meq via ORAL
  Filled 2019-08-26: qty 2

## 2019-08-26 NOTE — Progress Notes (Addendum)
Hypoglycemic Event  CBG: 33  Treatment: 8 oz juice/soda  Symptoms: None  Follow-up CBG: Time: 0042 CBG Result: 53  Treatment: 8 oz juice/soda  Follow-up CBG: Time: 0106 CBG Result: 89   Possible Reasons for Event: Unknown    [name] J Zairah Arista

## 2019-08-26 NOTE — Progress Notes (Signed)
Pharmacy Electrolyte Replacement  Recent Labs:  Recent Labs    08/26/19 0409  K 3.4*  MG 2.0  PHOS 4.2  CREATININE 0.40*    Low Critical Values (K </= 2.5, Phos </= 1, Mg </= 1) Present: None  Plan:  KCl Powder 40 x 1  Barth Kirks, PharmD, BCPS, BCCCP Clinical Pharmacist [phone]  Please check AMION for all [facility] numbers  08/26/2019 7:42 AM

## 2019-08-26 NOTE — Progress Notes (Signed)
PROGRESS NOTE    CHANCELER PULLIN  VQQ:[id number] DOB: 1955-08-24 DOA: 08/09/2019 PCP: [facility]   Brief Narrative:  Patient is a 64 year old male with history of bipolar disease, schizophrenia, chronic pancreatitis, hepatitis B, diabetes type 2 who presented with confusion, shortness of breath.  He was found to be encephalopathic, hypoxic on presentation.  Work-up revealed pneumonia.  He was intubated for acute hypoxic respiratory failure and was put on vent.  Hospital course remarkable for atrial fibrillation, urinary retention due to phimosis, dysphagia.  He was extubated on 7/31.  Patient transferred to [facility] service on 08/26/2019.  Urology also following  Assessment & Plan:   Active Problems:   AMS (altered mental status)   Shock circulatory ([place])   Acute respiratory failure requiring reintubation ([place])   Acute hypoxic respiratory failure: Suspected to be from aspiration pneumonia.  He completed antibiotics.  Had to be intubated on presentation.  Extubated on 7/31.  He still on high flow nasal cannula.  We will continue to monitor respiratory status.  Intermittent chest x-ray. He was also given few doses of Lasix for congestion.  Continue to attempt to wean the oxygen.  Acute metabolic encephalopathy: Secondary to hypoxia.  Mental status has been improving but he is still confused.  He has history of seizures , bipolar disorder, schizophrenia, neuropathy.  On Keppra, Abilify, gabapentin.  Avoid sedating medications.  A. fib with RVR: Transient.Currently in normal sinus rhythm.  Most likely triggered with acute hypoxic respiratory failure.  He was treated with amiodarone.  Echo showed normal left ventricular ejection fraction of 60 to 43%, grade 1 diastolic dysfunction.  Continue supportive care.  Not on anticoagulation.  Thrombocytopenia: Gradually improving without any signs of bleeding.  Suspected to be from cefepime/Pepcid or infectious etiology.  Normocytic anemia: Most  likely associated with critical illness . we will continue to monitor H&H.  Transfuse if necessary  Hypokalemia: Being supplemented  Hyponatremia: Mild.  Continue to monitor  Severe protein calorie malnutrition: Has a history of chronic pancreatitis.  Dietitian following.  Currently on dysphagia 3 diet.  On Creon for chronic pancreatitis  Urine retention/phimosis: Urology following.  Placed Foley catheter.  Recommended to continue Foley catheter until he follows with urology as an outpatient.  Diabetes mellitus with hyperglycemia: Currently on sliding scale insulin.  Monitor blood sugars  Debility/deconditioning: Patient seen by PT and OT and recommended home health on discharge.  Patient can be transferred to telemetry today.   Nutrition Problem: Severe Malnutrition Etiology: chronic illness (poorly controlled T2DM, pancreatitis)      DVT prophylaxis:SCD Code Status: Full Family Communication: None Status is: Inpatient  Remains inpatient appropriate because:Altered mental status   Dispo: The patient is from: Home              Anticipated d/c is to: Home vs SNF              Anticipated d/c date is: 2-3 days              Patient currently is not medically stable to d/c. Still on significant oxygen requirement, remains confused   Consultants: PCCM  Procedures:Intubation  Antimicrobials:  Anti-infectives (From admission, onward)   Start     Dose/Rate Route Frequency Ordered Stop   08/17/19 2245  cefTRIAXone (ROCEPHIN) 1 g in sodium chloride 0.9 % 100 mL IVPB        1 g 200 mL/hr over 30 Minutes Intravenous Every 12 hours 08/17/19 2149 08/20/19 1126   08/17/2019 2330  vancomycin (VANCOREADY) IVPB 500 mg/100 mL  Status:  Discontinued        500 mg 100 mL/hr over 60 Minutes Intravenous Every 12 hours 08/11/2019 1056 08/15/19 1131   08/13/2019 2300  ceFEPIme (MAXIPIME) 2 g in sodium chloride 0.9 % 100 mL IVPB  Status:  Discontinued        2 g 200 mL/hr over 30 Minutes  Intravenous Every 12 hours 08/18/2019 1056 08/17/19 1833   07/30/2019 1030  vancomycin (VANCOREADY) IVPB 1250 mg/250 mL        1,250 mg 166.7 mL/hr over 90 Minutes Intravenous  Once 08/17/2019 1019 07/24/2019 1454   08/11/2019 0945  ceFEPIme (MAXIPIME) 2 g in sodium chloride 0.9 % 100 mL IVPB        2 g 200 mL/hr over 30 Minutes Intravenous  Once 08/17/2019 0933 08/13/2019 1148   08/17/2019 0945  vancomycin (VANCOCIN) IVPB 1000 mg/200 mL premix  Status:  Discontinued        1,000 mg 200 mL/hr over 60 Minutes Intravenous  Once 07/24/2019 0933 08/13/2019 1019      Subjective: Patient seen and examined at the bedside today.  Hemodynamically stable.  Sitter was at the bedside.  He was on 6 to 7 L of oxygen.  Looked comfortable.  Denies any complaints.  Not on any kind of respiratory distress.  Not oriented to time.  Objective: Vitals:   08/26/19 0419 08/26/19 0500 08/26/19 0600 08/26/19 0700  BP:  131/76 (!) 146/47 134/64  Pulse:  80 86 84  Resp:  (!) 23 (!) 25 (!) 23  Temp:      TempSrc:      SpO2:  90% 94% 100%  Weight: 62.2 kg     Height:        Intake/Output Summary (Last 24 hours) at 08/26/2019 0748 Last data filed at 08/26/2019 0400 Gross per 24 hour  Intake 1049.97 ml  Output 1565 ml  Net -515.03 ml   Filed Weights   08/24/19 0500 08/25/19 0450 08/26/19 0419  Weight: 61.5 kg 61.3 kg 62.2 kg    Examination:  General exam: ANot in distress,average built HEENT:PERRL,Oral mucosa moist, Ear/Nose normal on gross exam Respiratory system: Bilateral equal air entry, normal vesicular breath sounds, no wheezes or crackles  Cardiovascular system: S1 & S2 heard, RRR. No JVD, murmurs, rubs, gallops or clicks. No pedal edema. Gastrointestinal system: Abdomen is nondistended, soft and nontender. No organomegaly or masses felt. Normal bowel sounds heard. Central nervous system: Alert and awake but oriented to place only Extremities: No edema, no clubbing ,no cyanosis Skin: No rashes, lesions or ulcers,no  icterus ,no pallor Psychiatry: Judgement and insight appear impaired   Data Reviewed: I have personally reviewed following labs and imaging studies  CBC: Recent Labs  Lab 08/22/19 0442 08/22/19 0442 08/22/19 1122 08/23/19 0447 08/24/19 0225 08/25/19 0500 08/26/19 0409  WBC 17.3*  --   --  16.2* 15.2* 13.5* 11.7*  HGB 9.5*   < > 12.9* 9.6* 9.5* 8.8* 8.5*  HCT 26.7*   < > 38.0* 27.6* 27.3* 26.2* 25.6*  MCV 89.0  --   --  91.1 91.6 93.2 94.5  PLT 62*  --   --  82* 87* 90* 88*   < > = values in this interval not displayed.   Basic Metabolic Panel: Recent Labs  Lab 08/20/19 0230 08/21/19 0458 08/22/19 0442 08/22/19 1122 08/23/19 0447 08/24/19 0225 08/25/19 0500 08/25/19 1611 08/26/19 0409  NA 145   < > 149*   < >  146* 146* 148* 146* 147*  K 3.9   < > 3.7   < > 3.1* 3.8 2.7* 3.4* 3.4*  CL 115*   < > 111  --  111 111 113* 107 107  CO2 24   < > 29  --  _0 GLUCOSE 228*   < > 212*  --  145* 197* 154* 225* 89  BUN 9   < > 13  --  _1 CREATININE 0.60*   < > 0.57*  --  0.51* 0.49* 0.55* 0.63 0.40*  CALCIUM 7.7*   < > 7.7*  --  7.5* 7.6* 7.6* 7.7* 7.5*  MG 2.0  --  1.7  --   --  1.8 1.7  --  2.0  PHOS  --   --   --   --   --   --  3.2  --  4.2   < > = values in this interval not displayed.   GFR: Estimated Creatinine Clearance: 81.2 mL/min (A) (by C-G formula based on SCr of 0.4 mg/dL (L)). Liver Function Tests: No results for input(s): AST, ALT, ALKPHOS, BILITOT, PROT, ALBUMIN in the last 168 hours. No results for input(s): LIPASE, AMYLASE in the last 168 hours. No results for input(s): AMMONIA in the last 168 hours. Coagulation Profile: No results for input(s): INR, PROTIME in the last 168 hours. Cardiac Enzymes: No results for input(s): CKTOTAL, CKMB, CKMBINDEX, TROPONINI in the last 168 hours. BNP (last 3 results) No results for input(s): PROBNP in the last 8760 hours. HbA1C: No results for input(s): HGBA1C in the last 72 hours. CBG: Recent  Labs  Lab 08/26/19 0013 08/26/19 0017 08/26/19 0042 08/26/19 0106 08/26/19 0412  GLUCAP 38* 33* 53* 89 88   Lipid Profile: No results for input(s): CHOL, HDL, LDLCALC, TRIG, CHOLHDL, LDLDIRECT in the last 72 hours. Thyroid Function Tests: No results for input(s): TSH, T4TOTAL, FREET4, T3FREE, THYROIDAB in the last 72 hours. Anemia Panel: No results for input(s): VITAMINB12, FOLATE, FERRITIN, TIBC, IRON, RETICCTPCT in the last 72 hours. Sepsis Labs: No results for input(s): PROCALCITON, LATICACIDVEN in the last 168 hours.  Recent Results (from the past 240 hour(s))  Culture, respiratory (non-expectorated)     Status: None   Collection Time: 08/21/19 11:34 AM   Specimen: Tracheal Aspirate; Respiratory  Result Value Ref Range Status   Specimen Description TRACHEAL ASPIRATE  Final   Special Requests ENDOTRACHEAL  Final   Gram Stain   Final    RARE WBC PRESENT,BOTH PMN AND MONONUCLEAR RARE BUDDING YEAST SEEN RARE GRAM POSITIVE COCCI IN PAIRS    Culture   Final    Consistent with normal respiratory flora. Performed at [facility], [place] [address], [place], [place] [address]    Report Status 08/23/2019 FINAL  Final         Radiology Studies: DG Chest Port 1 View  Result Date: 08/25/2019 CLINICAL DATA:  Shortness of breath.  Pneumonia. EXAM: PORTABLE CHEST 1 VIEW COMPARISON:  08/24/2019 FINDINGS: Left central venous catheter with tip over the cavoatrial junction region. Shallow inspiration. Heart size is normal. Diffuse airspace disease throughout the lungs appears to be progressing since previous study. No pleural effusions. Enteric tube has been removed since prior study. IMPRESSION: Diffuse bilateral airspace disease appears to be progressing since previous study. Electronically Signed   By: Lucienne Capers M.D.   On: 08/25/2019 05:05   DG Swallowing Func-Speech Pathology  Result Date:  08/24/2019 Objective Swallowing Evaluation: Type of Study: MBS-Modified Barium  Swallow Study  Patient Details Name: Michael [name] MRN: [id number] Date of Birth: 05/26/1955 Today's Date: 08/24/2019 Time: SLP Start Time (ACUTE ONLY): 1415 -SLP Stop Time (ACUTE ONLY): 1440 SLP Time Calculation (min) (ACUTE ONLY): 25 min Past Medical History: Past Medical History: Diagnosis Date . Anxiety  . Arthritis   "joints; shoulders; feet" (06/08/2016) . Bipolar disorder ([place])  . Daily headache   "7:30 - 8:00 q night" (06/08/2016) . Diabetic peripheral neuropathy ([place])  . GERD (gastroesophageal reflux disease)  . History of hepatitis B virus infection conferring immunity 03/22/2018 . Pancreatitis  . Schizophrenia ([place])  . Seizures ([place]) 01/2016 X 2 . Type II diabetes mellitus ([place])  Past Surgical History: Past Surgical History: Procedure Laterality Date . BALLOON DILATION  03/22/2011  Procedure: BALLOON DILATION;  Surgeon: Gatha Mayer, MD;  Location: [facility];  Service: Endoscopy;  Laterality: N/A; . COLONOSCOPY N/A 05/22/2012  Procedure: COLONOSCOPY;  Surgeon: Gatha Mayer, MD;  Location: [facility];  Service: Endoscopy;  Laterality: N/A; . COLONOSCOPY W/ BIOPSIES AND POLYPECTOMY  09/12/11 . ESOPHAGOGASTRODUODENOSCOPY  03/22/2011  Procedure: ESOPHAGOGASTRODUODENOSCOPY (EGD);  Surgeon: Gatha Mayer, MD;  Location: [facility];  Service: Endoscopy;  Laterality: N/A;  egd with balloon  . EUS  04/20/2011  Procedure: UPPER ENDOSCOPIC ULTRASOUND (EUS) LINEAR;  Surgeon: Milus Banister, MD;  Location: [facility];  Service: Endoscopy;  Laterality: N/A; . KNEE ARTHROSCOPY Left  HPI: 65 year old with history of noncompliance insulin-dependent diabetes mellitus who presents 07/25/2019 with seizure activity, altered mental status, hyperglycemia, and hypokalemic. Pt with prolonged intubation, across multiple intubations, with self extubation 7/31.  Pt known to this service from prior admission during which MBSS on 03/14/19 revealed a "moderate oropharyngeal phase dysphagia characterized by impaired bolus control and a  pharyngeal delay which resulted in penetration and aspiration" with pt intermittently sensate to aspiration.  Pt was placed on puree diet with honey thick liquid in February.  Pt states that he has returned to regular, unmodified textures at home. CXR currently reveals multifocal PNA.  Subjective: Pt alert, impulsive, RN accompanied to MBSS. Assessment / Plan / Recommendation CHL IP CLINICAL IMPRESSIONS 08/24/2019 Clinical Impression Mr. Rineer demonstrates an ongoing mild oral and moderate pharyngeal dysphagia. Pt with history of dysphagia, previously requiring altered diet, but had returned to normal diet/thin liquids prior to hospitalization. Oral phase is characterized by prolonged mastication, likely due to being edentulous. Despite edentulous state, pt had adequate mastication and bolus recollection for swallow. Pharyngeally, pt was noted with reduced hyoid excursion, laryngeal elevation, epiglottic inversion, laryngeal vestibule closure, and swallow trigger after liquids enter laryngeal vestibule, which resulted in aspiration in moderate amounts of thin liquids when taken via straw and in trace amounts when taken via cup. Timing was improved with use of nectar thick liquids, with trigger occurring in the valleculae. He is noted to be very impulsive, taking large sequential sips despite cues with all boluses. Pt had no aspiration of nectar thick liquids, but did have transient penetration noted. Honey thick liquids prevented penetration and aspiration, but given poor renal function and pt report of disliking honey thick liquids, recommend nectar thick as safest least restrictive option. Esophageal phase could not be visualized due to patient positioning, but no retrograde movement was observed to pass up through UES. Recommend: nectar thick liquids, Dys 3 solids, small bites/sips, slow rate (will require cues/pacing), meds whole or crushed in puree as tolerated  SLP Visit Diagnosis Dysphagia, oropharyngeal phase  (  R13.12) Impact on safety and function Moderate aspiration risk   CHL IP TREATMENT RECOMMENDATION 08/24/2019 Treatment Recommendations Therapy as outlined in treatment plan below   Prognosis 08/24/2019 Prognosis for Safe Diet Advancement Fair CHL IP DIET RECOMMENDATION 08/24/2019 SLP Diet Recommendations Nectar thick liquid;Dysphagia 3 (Mech soft) solids Liquid Administration via Cup;Spoon;Straw Medication Administration Whole meds with puree Compensations Small sips/bites;Slow rate Postural Changes Seated upright at 90 degrees   CHL IP OTHER RECOMMENDATIONS 08/24/2019 Oral Care Recommendations Oral care BID Other Recommendations Order thickener from pharmacy   CHL IP FOLLOW UP RECOMMENDATIONS 08/24/2019 Follow up Recommendations Home health SLP   CHL IP FREQUENCY AND DURATION 08/24/2019 Speech Therapy Frequency (ACUTE ONLY) min 2x/week Treatment Duration 1 week      CHL IP ORAL PHASE 08/24/2019 Oral Phase WFL Oral - Regular Prolonged mastication  CHL IP PHARYNGEAL PHASE 08/24/2019 Pharyngeal Phase Impaired Pharyngeal- Honey Cup WFL Pharyngeal- Nectar Cup Reduced epiglottic inversion;Reduced laryngeal elevation;Reduced anterior laryngeal mobility;Reduced airway/laryngeal closure;Penetration/Aspiration before swallow;Pharyngeal residue - valleculae Pharyngeal Material enters airway, remains ABOVE vocal cords then ejected out Pharyngeal- Thin Cup Moderate aspiration;Penetration/Aspiration before swallow;Reduced epiglottic inversion;Reduced anterior laryngeal mobility;Reduced laryngeal elevation;Reduced airway/laryngeal closure Pharyngeal Material enters airway, passes BELOW cords and not ejected out despite cough attempt by patient Pharyngeal- Thin Straw Reduced pharyngeal peristalsis;Reduced epiglottic inversion;Reduced anterior laryngeal mobility;Reduced laryngeal elevation;Reduced airway/laryngeal closure;Penetration/Aspiration before swallow;Trace aspiration Pharyngeal Material enters airway, passes BELOW cords and not ejected  out despite cough attempt by patient Pharyngeal- Puree WFL Pharyngeal- Regular WFL  CHL IP CERVICAL ESOPHAGEAL PHASE 08/24/2019 Cervical Esophageal Phase [facility] [place] P. Isenhour, M.S., [place] Pathologist Acute Rehabilitation Services Pager: [place] 08/24/2019, 2:47 PM                   Scheduled Meds: . ARIPiprazole  1 mg Oral QHS  . chlorhexidine gluconate (MEDLINE KIT)  15 mL Mouth Rinse BID  . Chlorhexidine Gluconate Cloth  6 each Topical Q0600  . feeding supplement (ENSURE ENLIVE)  237 mL Oral TID WC  . gabapentin  300 mg Oral BID  . insulin aspart  0-9 Units Subcutaneous Q4H  . levETIRAcetam  500 mg Oral BID  . lipase/protease/amylase  36,000 Units Oral TID AC  . nicotine  21 mg Transdermal Daily  . potassium chloride  40 mEq Oral Once  . sodium chloride flush  10-40 mL Intracatheter Q12H   Continuous Infusions: . sodium chloride 10 mL/hr at 08/25/19 0640     LOS: 12 days    Time spent:35 mins. More than 50% of that time was spent in counseling and/or coordination of care.      Shelly Coss, MD [facility] P8/03/2019, 7:48 AM

## 2019-08-26 NOTE — Progress Notes (Signed)
Inpatient Diabetes Program Recommendations  AACE/ADA: New Consensus Statement on Inpatient Glycemic Control (2015)  Target Ranges:  Prepandial:   less than 140 mg/dL      Peak postprandial:   less than 180 mg/dL (1-2 hours)      Critically ill patients:  140 - 180 mg/dL   Results for Michael [name], Michael [name] (MRN [id number]) as of 08/26/2019 12:08  Ref. Range 08/24/2019 23:53 08/25/2019 03:52 08/25/2019 08:17 08/25/2019 11:51 08/25/2019 15:47 08/25/2019 20:09  Glucose-Capillary Latest Ref Range: 70 - 99 mg/dL 236 (H)  3 units NOVOLOG  168 (H)  2 units NOVOLOG  110 (H) 183 (H)  2 units NOVOLOG  219 (H)  3 units NOVOLOG  280 (H)  5 units NOVOLOG    Results for Michael [name], Michael [name] (MRN [id number]) as of 08/26/2019 12:08  Ref. Range 08/26/2019 00:13 08/26/2019 00:13 08/26/2019 00:17 08/26/2019 00:17 08/26/2019 00:42 08/26/2019 00:42 08/26/2019 01:06 08/26/2019 01:06 08/26/2019 04:12 08/26/2019 04:12 08/26/2019 07:50 08/26/2019 07:50 08/26/2019 11:40  Glucose-Capillary Latest Ref Range: 70 - 99 mg/dL 38 (LL) 38 (LL) 33 (LL) 33 (LL) 53 (L) 53 (L) 89 89 88 88 136 (H)  1 unit NOVOLOG  136 (H)  5 units NOVOLOG  278 (H)    Home DM Meds: Levemir 16 units BID       Novolog 10 units TID  Current Orders: Novolog Sensitive Correction Scale/ SSI (0-9 units) Q4 hours     Note patient had Severe Hypoglycemia at 12am after getting 5 units Novolog at 8pm.  Now eating Dysphagic PO diet and getting Ensure Enlive PO supplements.  CBG 278 at 12pm today.  MD- Please consider:  1. Change Novolog SSI timing to tid ac + hs (currently ordered Q4 hours)  2. Start Novolog Meal Coverage: Novolog 3 units TID with meals  (Please add the following Hold Parameters: Hold if pt eats <50% of meal, Hold if pt NPO)     --Will follow patient during hospitalization--  Wyn Quaker RN, MSN, CDE Diabetes Coordinator Inpatient Glycemic Control Team Team Pager: [phone] (8a-5p)

## 2019-08-26 NOTE — Plan of Care (Signed)
°  Problem: Education: Goal: Knowledge of General Education information will improve Description: Including pain rating scale, medication(s)/side effects and non-pharmacologic comfort measures Outcome: Progressing   Problem: Health Behavior/Discharge Planning: Goal: Ability to manage health-related needs will improve Outcome: Progressing   Problem: Clinical Measurements: Goal: Ability to maintain clinical measurements within normal limits will improve Outcome: Progressing Goal: Will remain free from infection Outcome: Progressing Goal: Diagnostic test results will improve Outcome: Progressing Goal: Respiratory complications will improve Outcome: Progressing Goal: Cardiovascular complication will be avoided Outcome: Progressing   Problem: Activity: Goal: Risk for activity intolerance will decrease Outcome: Progressing   Problem: Nutrition: Goal: Adequate nutrition will be maintained Outcome: Progressing   Problem: Coping: Goal: Level of anxiety will decrease Outcome: Progressing   Problem: Elimination: Goal: Will not experience complications related to bowel motility Outcome: Progressing Goal: Will not experience complications related to urinary retention Outcome: Progressing   Problem: Pain Managment: Goal: General experience of comfort will improve Outcome: Progressing   Problem: Safety: Goal: Ability to remain free from injury will improve Outcome: Progressing   Problem: Skin Integrity: Goal: Risk for impaired skin integrity will decrease Outcome: Progressing   Problem: Education: Goal: Ability to describe self-care measures that may prevent or decrease complications (Diabetes Survival Skills Education) will improve Outcome: Progressing Goal: Individualized Educational Video(s) Outcome: Progressing   Problem: Coping: Goal: Ability to adjust to condition or change in health will improve Outcome: Progressing   Problem: Fluid Volume: Goal: Ability to  maintain a balanced intake and output will improve Outcome: Progressing   Problem: Health Behavior/Discharge Planning: Goal: Ability to manage health-related needs will improve Outcome: Progressing   Problem: Metabolic: Goal: Ability to maintain appropriate glucose levels will improve Outcome: Progressing   Problem: Nutritional: Goal: Maintenance of adequate nutrition will improve Outcome: Progressing Goal: Progress toward achieving an optimal weight will improve Outcome: Progressing   Problem: Skin Integrity: Goal: Risk for impaired skin integrity will decrease Outcome: Progressing   Problem: Tissue Perfusion: Goal: Adequacy of tissue perfusion will improve Outcome: Progressing

## 2019-08-27 ENCOUNTER — Inpatient Hospital Stay (HOSPITAL_COMMUNITY): Payer: Medicare HMO

## 2019-08-27 DIAGNOSIS — J9600 Acute respiratory failure, unspecified whether with hypoxia or hypercapnia: Secondary | ICD-10-CM | POA: Diagnosis not present

## 2019-08-27 LAB — CBC WITH DIFFERENTIAL/PLATELET
Abs Immature Granulocytes: 0.05 10*3/uL (ref 0.00–0.07)
Basophils Absolute: 0 10*3/uL (ref 0.0–0.1)
Basophils Relative: 0 %
Eosinophils Absolute: 0.1 10*3/uL (ref 0.0–0.5)
Eosinophils Relative: 1 %
HCT: 27.9 % — ABNORMAL LOW (ref 39.0–52.0)
Hemoglobin: 9.2 g/dL — ABNORMAL LOW (ref 13.0–17.0)
Immature Granulocytes: 1 %
Lymphocytes Relative: 19 %
Lymphs Abs: 1.7 10*3/uL (ref 0.7–4.0)
MCH: 31.6 pg (ref 26.0–34.0)
MCHC: 33 g/dL (ref 30.0–36.0)
MCV: 95.9 fL (ref 80.0–100.0)
Monocytes Absolute: 0.3 10*3/uL (ref 0.1–1.0)
Monocytes Relative: 3 %
Neutro Abs: 6.7 10*3/uL (ref 1.7–7.7)
Neutrophils Relative %: 76 %
Platelets: 94 10*3/uL — ABNORMAL LOW (ref 150–400)
RBC: 2.91 MIL/uL — ABNORMAL LOW (ref 4.22–5.81)
RDW: 19.3 % — ABNORMAL HIGH (ref 11.5–15.5)
WBC: 8.8 10*3/uL (ref 4.0–10.5)
nRBC: 0.8 % — ABNORMAL HIGH (ref 0.0–0.2)

## 2019-08-27 LAB — BASIC METABOLIC PANEL
Anion gap: 10 (ref 5–15)
BUN: 13 mg/dL (ref 8–23)
CO2: 28 mmol/L (ref 22–32)
Calcium: 7.8 mg/dL — ABNORMAL LOW (ref 8.9–10.3)
Chloride: 111 mmol/L (ref 98–111)
Creatinine, Ser: 0.59 mg/dL — ABNORMAL LOW (ref 0.61–1.24)
GFR calc Af Amer: >60 mL/min (ref >60)
GFR calc non Af Amer: >60 mL/min (ref >60)
Glucose, Bld: 141 mg/dL — ABNORMAL HIGH (ref 70–99)
Potassium: 3.3 mmol/L — ABNORMAL LOW (ref 3.5–5.1)
Sodium: 149 mmol/L — ABNORMAL HIGH (ref 135–145)

## 2019-08-27 LAB — GLUCOSE, CAPILLARY
Glucose-Capillary: 128 mg/dL — ABNORMAL HIGH (ref 70–99)
Glucose-Capillary: 139 mg/dL — ABNORMAL HIGH (ref 70–99)
Glucose-Capillary: 148 mg/dL — ABNORMAL HIGH (ref 70–99)
Glucose-Capillary: 157 mg/dL — ABNORMAL HIGH (ref 70–99)
Glucose-Capillary: 324 mg/dL — ABNORMAL HIGH (ref 70–99)
Glucose-Capillary: 324 mg/dL — ABNORMAL HIGH (ref 70–99)
Glucose-Capillary: 38 mg/dL — CL (ref 70–99)

## 2019-08-27 MED ORDER — FREE WATER
300.0000 mL | Freq: Three times a day (TID) | Status: DC
  Administered 2019-08-27 – 2019-09-01 (×12): 300 mL via ORAL

## 2019-08-27 MED ORDER — FUROSEMIDE 10 MG/ML IJ SOLN
40.0000 mg | Freq: Once | INTRAMUSCULAR | Status: AC
  Administered 2019-08-27: 40 mg via INTRAVENOUS
  Filled 2019-08-27: qty 4

## 2019-08-27 MED ORDER — FUROSEMIDE 40 MG PO TABS
40.0000 mg | ORAL_TABLET | Freq: Every day | ORAL | Status: DC
  Administered 2019-08-28 – 2019-08-30 (×3): 40 mg via ORAL
  Filled 2019-08-27 (×3): qty 1

## 2019-08-27 MED ORDER — POTASSIUM CHLORIDE 20 MEQ PO PACK
40.0000 meq | PACK | Freq: Two times a day (BID) | ORAL | Status: DC
  Administered 2019-08-27 – 2019-08-28 (×3): 40 meq via ORAL
  Filled 2019-08-27 (×4): qty 2

## 2019-08-27 MED ORDER — STARCH (THICKENING) PO POWD: ORAL | Status: DC | PRN

## 2019-08-27 NOTE — Progress Notes (Signed)
Physical Therapy Treatment Patient Details Name: Michael [name] MRN: [id number] DOB: 10/14/1955 Today's Date: 08/27/2019    History of Present Illness Pt is a 64 year old man with history of bipolar disease, schizophrenia, chronic pancreatitis, hep B, diabetes.  He was admitted with encephalopathy and pneumonia, required intubation, mechanical ventilation, pressors.  Course complicated by atrial fibrillation.  He was extubated 7/31    PT Comments    Pt incredibly limited this session secondary to lethargy and fatigue. He was unable to tolerate more than bed mobility this session, and continues to require min-mod A to complete. Unless pt has 24/7 supervision/assistance and family support, would likely required short-term rehab at SNF prior to returning home. Pt would continue to benefit from skilled physical therapy services at this time while admitted and after d/c to address the below listed limitations in order to improve overall safety and independence with functional mobility.    Follow Up Recommendations  SNF;Other (comment) (unless pt has 24/7 support at home, then can do HHPT)     Equipment Recommendations  None recommended by PT    Recommendations for Other Services       Precautions / Restrictions Precautions Precautions: Fall Restrictions Weight Bearing Restrictions: No    Mobility  Bed Mobility Overal bed mobility: Needs Assistance Bed Mobility: Supine to Sit;Sit to Supine     Supine to sit: Mod assist Sit to supine: Min assist   General bed mobility comments: pt requiring max encouragement and cueing to participate. He required mod A for trunk elevation to achieve upright sitting at EOB. Attempted to have pt scoot forwards at EOB; however, he prematurely laid himself back down in the bed and refused any further mobility at this time  Transfers                    Ambulation/Gait                 Stairs             Wheelchair Mobility     Modified Rankin (Stroke Patients Only)       Balance Overall balance assessment: Needs assistance   Sitting balance-Leahy Scale: Fair                                      Cognition Arousal/Alertness: Lethargic Behavior During Therapy: WFL for tasks assessed/performed Overall Cognitive Status: Impaired/Different from baseline Area of Impairment: Problem solving;Safety/judgement                         Safety/Judgement: Decreased awareness of deficits;Decreased awareness of safety   Problem Solving: Slow processing;Decreased initiation        Exercises      General Comments        Pertinent Vitals/Pain Pain Assessment: No/denies pain    Home Living                      Prior Function            PT Goals (current goals can now be found in the care plan section) Acute Rehab PT Goals PT Goal Formulation: Patient unable to participate in goal setting Time For Goal Achievement: 09/08/19 Potential to Achieve Goals: Fair Progress towards PT goals: Not progressing toward goals - comment (lethargy)    Frequency    Min 3X/week  PT Plan Current plan remains appropriate    Co-evaluation              AM-PAC PT "6 Clicks" Mobility   Outcome Measure  Help needed turning from your back to your side while in a flat bed without using bedrails?: A Little Help needed moving from lying on your back to sitting on the side of a flat bed without using bedrails?: A Little Help needed moving to and from a bed to a chair (including a wheelchair)?: A Lot Help needed standing up from a chair using your arms (e.g., wheelchair or bedside chair)?: A Lot Help needed to walk in hospital room?: A Lot Help needed climbing 3-5 steps with a railing? : A Lot 6 Click Score: 14    End of Session   Activity Tolerance: Patient limited by fatigue;Patient limited by lethargy Patient left: in bed;with call bell/phone within reach;with bed alarm  set;with restraints reapplied Nurse Communication: Mobility status PT Visit Diagnosis: Unsteadiness on feet (R26.81);Other abnormalities of gait and mobility (R26.89);Difficulty in walking, not elsewhere classified (R26.2)     Time: 9311-2162 PT Time Calculation (min) (ACUTE ONLY): 12 min  Charges:  $Therapeutic Activity: 8-22 mins                     Anastasio Champion, DPT  Acute Rehabilitation Services Pager [phone] Office [place] 08/27/2019, 10:00 AM

## 2019-08-27 NOTE — Progress Notes (Signed)
  Speech Language Pathology Treatment: Dysphagia  Patient Details Name: CHRISTON PARADA MRN: [id number] DOB: 29-May-1955 Today's Date: 08/27/2019 Time: 7353-2992 SLP Time Calculation (min) (ACUTE ONLY): 15 min  Assessment / Plan / Recommendation Clinical Impression  Mr. Poitras was seen for therapeutic diet tolerance with recently recommended diet of nectar thick liquids and dysphagia 3 solids. Pt denies any difficulty. He was upright in bed upon SLP arrival, drinking an unthickened Ensure; no thickener seen in room; SLP re-ordered. Pt was seen with nectar thick liquid and dysphagia 3 solids with single instance of strong cough after initial sip of liquid; not duplicated on subsequent trials. Potentially unrelated, however, most recent CXR (08/25/19) shoed progressive diffuse airspace disease. Of note, this CXR was completed less than 24 hours after new diet upgrade and may not reflect his current lung status. He has had no fevers and nursing has no concerns regarding tolerance. At this time, continue current diet recommendation, but monitor closely. Should pt show cough response with nectar thick liquids again or CXR worsen, he may benefit from a downgrade to honey thick liquid consistency, as it showed no penetration on MBSS. Pt is known with medical non-compliance and is fairly open about the fact that once he goes home, he will be consuming thin liquids. Education provided regarding importance of oral care and taking small sips. Pt demonstrates understanding.    HPI HPI: 64 year old with history of noncompliance insulin-dependent diabetes mellitus who presents 07/25/2019 with seizure activity, altered mental status, hyperglycemia, and hypokalemic. Pt with prolonged intubation, across multiple intubations, with self extubation 7/31.  Pt known to this service from prior admission during which MBSS on 03/14/19 revealed a "moderate oropharyngeal phase dysphagia characterized by impaired bolus control and a  pharyngeal delay which resulted in penetration and aspiration" with pt intermittently sensate to aspiration.  Pt was placed on puree diet with honey thick liquid in February.  Pt states that he has returned to regular, unmodified textures at home. New MBSS this stay showed aspiration of thin, penetration of nectar (transient), and no difficulty with honey, but pt fairly opposed to honey upon discussion. Now on nectar and dysphagia 3.      SLP Plan  Continue with current plan of care       Recommendations  Diet recommendations: Dysphagia 3 (mechanical soft);Nectar-thick liquid Liquids provided via: Cup Medication Administration: Whole meds with puree Supervision: Patient able to self feed;Intermittent supervision to cue for compensatory strategies Compensations: Slow rate;Small sips/bites Postural Changes and/or Swallow Maneuvers: Seated upright 90 degrees;Upright 30-60 min after meal                Oral Care Recommendations: Oral care BID Follow up Recommendations: Home health SLP SLP Visit Diagnosis: Dysphagia, oropharyngeal phase (R13.12) Plan: Continue with current plan of care                     Joelly Bolanos P. Balbina Depace, M.S., [place] Pathologist Acute Rehabilitation Services Pager: [place] 08/27/2019, 9:55 AM

## 2019-08-27 NOTE — Progress Notes (Addendum)
PROGRESS NOTE    Michael [name]  ZMO:[id number] DOB: [birth year] DOA: 08/13/2019 PCP: [facility]   Brief Narrative:  Patient is a 64 year old male with history of bipolar disease, schizophrenia, chronic pancreatitis, hepatitis B, diabetes type 2 who presented with confusion, shortness of breath.  He was found to be encephalopathic, hypoxic on presentation.  Work-up revealed pneumonia.  He was intubated for acute hypoxic respiratory failure and was put on vent.  Hospital course remarkable for atrial fibrillation, urinary retention due to phimosis, dysphagia.  He was extubated on 7/31.  Patient transferred to [facility] service on 08/26/2019.  Urology also following  Assessment & Plan:   Active Problems:   AMS (altered mental status)   Shock circulatory ([place])   Acute respiratory failure requiring reintubation ([place])   Acute hypoxic respiratory failure: Suspected to be from aspiration pneumonia.  He completed antibiotics.  Had to be intubated on presentation.  Extubated on 7/31.  He still on oxygen at 4-5 L vial cannula.  We will continue to monitor respiratory status.  Intermittent chest x-ray will be done. He was also given few doses of Lasix for congestion.  Continue to attempt to wean the oxygen. Appears congested today so given 1 more dose of Lasix IV.  We will continue oral Lasix on the scheduled doses.  Acute metabolic encephalopathy: Secondary to hypoxia.  Mental status has been improving .  Today he was alert and oriented.  He has history of seizures , bipolar disorder, schizophrenia, neuropathy.  On Keppra, Abilify, gabapentin.  Avoid sedating medications.  A. fib with RVR: Transient.Currently in normal sinus rhythm.  Most likely triggered with acute hypoxic respiratory failure.  He was treated with amiodarone.    Continue supportive care.  Not on anticoagulation.  Diastolic congestive heart failure: Echo showed normal left ventricular ejection fraction of 60 to 65%, grade 1  diastolic dysfunction.  He has been given few days of IV Lasix.  We will continue Lasix 40 mg daily.  Thrombocytopenia: Gradually improving without any signs of bleeding.  Suspected to be from cefepime/Pepcid or infectious etiology.  Normocytic anemia: Most likely associated with critical illness . we will continue to monitor H&H.  Transfuse if necessary  Hypokalemia: Being supplemented  Hypernatremia: Mild.  Continue to monitor  Severe protein calorie malnutrition: Has a history of chronic pancreatitis.  Dietitian following.  Currently on dysphagia 3 diet.  On Creon for chronic pancreatitis  Urine retention/phimosis: Urology following.  Placed Foley catheter.  Recommended to continue Foley catheter until he follows with urology as an outpatient.  Diabetes mellitus with hyperglycemia: Currently on sliding scale insulin.  Monitor blood sugars  Debility/deconditioning: Patient seen by PT and OT and recommended home health on discharge.     Nutrition Problem: Severe Malnutrition Etiology: chronic illness (poorly controlled T2DM, pancreatitis)      DVT prophylaxis:SCD Code Status: Full Family Communication: called sister [name],call not received Status is: Inpatient   Dispo: The patient is from: Home              Anticipated d/c is to: Home with home health              Anticipated d/c date is: 2-3 days              Patient currently is not medically stable to d/c. Still on significant oxygen requirement, sounds congested.   Consultants: PCCM  Procedures:Intubation  Antimicrobials:  Anti-infectives (From admission, onward)   Start     Dose/Rate Route Frequency  Ordered Stop   08/17/19 2245  cefTRIAXone (ROCEPHIN) 1 g in sodium chloride 0.9 % 100 mL IVPB        1 g 200 mL/hr over 30 Minutes Intravenous Every 12 hours 08/17/19 2149 08/20/19 1126   08/17/2019 2330  vancomycin (VANCOREADY) IVPB 500 mg/100 mL  Status:  Discontinued        500 mg 100 mL/hr over 60 Minutes  Intravenous Every 12 hours 08/15/2019 1056 08/15/19 1131   07/27/2019 2300  ceFEPIme (MAXIPIME) 2 g in sodium chloride 0.9 % 100 mL IVPB  Status:  Discontinued        2 g 200 mL/hr over 30 Minutes Intravenous Every 12 hours 08/09/2019 1056 08/17/19 1833   08/17/2019 1030  vancomycin (VANCOREADY) IVPB 1250 mg/250 mL        1,250 mg 166.7 mL/hr over 90 Minutes Intravenous  Once 08/18/2019 1019 08/20/2019 1454   08/01/2019 0945  ceFEPIme (MAXIPIME) 2 g in sodium chloride 0.9 % 100 mL IVPB        2 g 200 mL/hr over 30 Minutes Intravenous  Once 08/03/2019 0933 08/05/2019 1148   07/31/2019 0945  vancomycin (VANCOCIN) IVPB 1000 mg/200 mL premix  Status:  Discontinued        1,000 mg 200 mL/hr over 60 Minutes Intravenous  Once 08/05/2019 0933 08/15/2019 1019      Subjective: Patient seen and examined the bedside today.  Mental status has improved and he is currently alert and oriented.  Chest examination revealed bibasilar crackles.  He was having cough.  On 5 L of oxygen this morning..  Objective: Vitals:   08/26/19 1735 08/26/19 1800 08/26/19 1930 08/27/19 0449  BP: 119/69 111/75 114/78 99/64  Pulse: 95 97 85 79  Resp: (!) 27 (!) 25 18 18   Temp:   98 F (36.7 C) 98 F (36.7 C)  TempSrc:   Oral   SpO2: 92% 95% 92% 92%  Weight:      Height:        Intake/Output Summary (Last 24 hours) at 08/27/2019 0839 Last data filed at 08/27/2019 0454 Gross per 24 hour  Intake 1210 ml  Output 600 ml  Net 610 ml   Filed Weights   08/24/19 0500 08/25/19 0450 08/26/19 0419  Weight: 61.5 kg 61.3 kg 62.2 kg    Examination:  General exam: Not in distress,average built, chronically ill looking HEENT:PERRL,Oral mucosa moist, Ear/Nose normal on gross exam Respiratory system: Bilateral coarse crackles  cardiovascular system: S1 & S2 heard, RRR. No JVD, murmurs, rubs, gallops or clicks. No pedal edema. Gastrointestinal system: Abdomen is nondistended, soft and nontender. No organomegaly or masses felt. Normal bowel sounds  heard. Central nervous system: Alert and awake but oriented to place only Extremities: No edema, no clubbing ,no cyanosis Skin: No rashes, lesions or ulcers,no icterus ,no pallor    Data Reviewed: I have personally reviewed following labs and imaging studies  CBC: Recent Labs  Lab 08/23/19 0447 08/24/19 0225 08/25/19 0500 08/26/19 0409 08/27/19 0434  WBC 16.2* 15.2* 13.5* 11.7* 8.8  NEUTROABS  --   --   --   --  6.7  HGB 9.6* 9.5* 8.8* 8.5* 9.2*  HCT 27.6* 27.3* 26.2* 25.6* 27.9*  MCV 91.1 91.6 93.2 94.5 95.9  PLT 82* 87* 90* 88* 94*   Basic Metabolic Panel: Recent Labs  Lab 08/22/19 0442 08/22/19 1122 08/24/19 0225 08/25/19 0500 08/25/19 1611 08/26/19 0409 08/27/19 0434  NA 149*   < > 146* 148* 146* 147* 149*  K 3.7   < > 3.8 2.7* 3.4* 3.4* 3.3*  CL 111   < > 111 113* 107 107 111  CO2 29   < > 29 28 28 30 28   GLUCOSE 212*   < > 197* 154* 225* 89 141*  BUN 13   < > 14 15 10 11 13   CREATININE 0.57*   < > 0.49* 0.55* 0.63 0.40* 0.59*  CALCIUM 7.7*   < > 7.6* 7.6* 7.7* 7.5* 7.8*  MG 1.7  --  1.8 1.7  --  2.0  --   PHOS  --   --   --  3.2  --  4.2  --    < > = values in this interval not displayed.   GFR: Estimated Creatinine Clearance: 81.2 mL/min (A) (by C-G formula based on SCr of 0.59 mg/dL (L)). Liver Function Tests: No results for input(s): AST, ALT, ALKPHOS, BILITOT, PROT, ALBUMIN in the last 168 hours. No results for input(s): LIPASE, AMYLASE in the last 168 hours. No results for input(s): AMMONIA in the last 168 hours. Coagulation Profile: No results for input(s): INR, PROTIME in the last 168 hours. Cardiac Enzymes: No results for input(s): CKTOTAL, CKMB, CKMBINDEX, TROPONINI in the last 168 hours. BNP (last 3 results) No results for input(s): PROBNP in the last 8760 hours. HbA1C: No results for input(s): HGBA1C in the last 72 hours. CBG: Recent Labs  Lab 08/26/19 1140 08/26/19 1519 08/26/19 2231 08/27/19 0448 08/27/19 0749  GLUCAP 278* 258*  305* 139* 128*   Lipid Profile: No results for input(s): CHOL, HDL, LDLCALC, TRIG, CHOLHDL, LDLDIRECT in the last 72 hours. Thyroid Function Tests: No results for input(s): TSH, T4TOTAL, FREET4, T3FREE, THYROIDAB in the last 72 hours. Anemia Panel: No results for input(s): VITAMINB12, FOLATE, FERRITIN, TIBC, IRON, RETICCTPCT in the last 72 hours. Sepsis Labs: No results for input(s): PROCALCITON, LATICACIDVEN in the last 168 hours.  Recent Results (from the past 240 hour(s))  Culture, respiratory (non-expectorated)     Status: None   Collection Time: 08/21/19 11:34 AM   Specimen: Tracheal Aspirate; Respiratory  Result Value Ref Range Status   Specimen Description TRACHEAL ASPIRATE  Final   Special Requests ENDOTRACHEAL  Final   Gram Stain   Final    RARE WBC PRESENT,BOTH PMN AND MONONUCLEAR RARE BUDDING YEAST SEEN RARE GRAM POSITIVE COCCI IN PAIRS    Culture   Final    Consistent with normal respiratory flora. Performed at [facility], [place] [address] [place], [place] [address]    Report Status 08/23/2019 FINAL  Final         Radiology Studies: No results found.      Scheduled Meds: . ARIPiprazole  1 mg Oral QHS  . chlorhexidine gluconate (MEDLINE KIT)  15 mL Mouth Rinse BID  . Chlorhexidine Gluconate Cloth  6 each Topical Q0600  . feeding supplement (ENSURE ENLIVE)  237 mL Oral TID WC  . free water  300 mL Oral Q8H  . gabapentin  300 mg Oral BID  . insulin aspart  0-9 Units Subcutaneous Q4H  . levETIRAcetam  500 mg Oral BID  . lipase/protease/amylase  36,000 Units Oral TID AC  . nicotine  21 mg Transdermal Daily  . potassium chloride  40 mEq Oral BID  . sodium chloride flush  10-40 mL Intracatheter Q12H   Continuous Infusions: . sodium chloride 10 mL/hr at 08/25/19 0640     LOS: 13 days    Time spent:25 mins. More  than 50% of that time was spent in counseling and/or coordination of care.      Shelly Coss, MD [facility] P8/04/2019, 8:39 AM

## 2019-08-28 DIAGNOSIS — J9600 Acute respiratory failure, unspecified whether with hypoxia or hypercapnia: Secondary | ICD-10-CM | POA: Diagnosis not present

## 2019-08-28 LAB — GLUCOSE, CAPILLARY
Glucose-Capillary: 176 mg/dL — ABNORMAL HIGH (ref 70–99)
Glucose-Capillary: 182 mg/dL — ABNORMAL HIGH (ref 70–99)
Glucose-Capillary: 236 mg/dL — ABNORMAL HIGH (ref 70–99)
Glucose-Capillary: 283 mg/dL — ABNORMAL HIGH (ref 70–99)
Glucose-Capillary: 278 mg/dL — ABNORMAL HIGH (ref 70–99)

## 2019-08-28 LAB — BASIC METABOLIC PANEL
Anion gap: 13 (ref 5–15)
BUN: 11 mg/dL (ref 8–23)
CO2: 24 mmol/L (ref 22–32)
Calcium: 7.8 mg/dL — ABNORMAL LOW (ref 8.9–10.3)
Chloride: 105 mmol/L (ref 98–111)
Creatinine, Ser: 0.53 mg/dL — ABNORMAL LOW (ref 0.61–1.24)
GFR calc Af Amer: >60 mL/min (ref >60)
GFR calc non Af Amer: >60 mL/min (ref >60)
Glucose, Bld: 214 mg/dL — ABNORMAL HIGH (ref 70–99)
Potassium: 4 mmol/L (ref 3.5–5.1)
Sodium: 142 mmol/L (ref 135–145)

## 2019-08-28 MED ORDER — INSULIN ASPART 100 UNIT/ML ~~LOC~~ SOLN
0.0000 [IU] | Freq: Every day | SUBCUTANEOUS | Status: DC
  Administered 2019-08-28: 3 [IU] via SUBCUTANEOUS
  Administered 2019-08-29: 2 [IU] via SUBCUTANEOUS

## 2019-08-28 MED ORDER — INSULIN ASPART 100 UNIT/ML ~~LOC~~ SOLN
2.0000 [IU] | Freq: Three times a day (TID) | SUBCUTANEOUS | Status: DC
  Administered 2019-08-28 – 2019-08-30 (×6): 2 [IU] via SUBCUTANEOUS

## 2019-08-28 MED ORDER — INSULIN ASPART 100 UNIT/ML ~~LOC~~ SOLN
0.0000 [IU] | Freq: Three times a day (TID) | SUBCUTANEOUS | Status: DC
  Administered 2019-08-28: 5 [IU] via SUBCUTANEOUS
  Administered 2019-08-29: 1 [IU] via SUBCUTANEOUS
  Administered 2019-08-29: 2 [IU] via SUBCUTANEOUS
  Administered 2019-08-29: 3 [IU] via SUBCUTANEOUS
  Administered 2019-08-30: 2 [IU] via SUBCUTANEOUS
  Administered 2019-08-30: 7 [IU] via SUBCUTANEOUS

## 2019-08-28 NOTE — Progress Notes (Addendum)
PROGRESS NOTE    Michael [name]  FGH:[id number] DOB: [birth year] DOA: 08/06/2019 PCP: [facility]   Brief Narrative:  Patient is a 64 year old male with history of bipolar disease, schizophrenia, chronic pancreatitis, hepatitis B, diabetes type 2 who presented with confusion, shortness of breath.  He was found to be encephalopathic, hypoxic on presentation.  Work-up revealed pneumonia.  He was intubated for acute hypoxic respiratory failure and was put on vent.  Hospital course remarkable for atrial fibrillation, urinary retention due to phimosis, dysphagia.  He was extubated on 7/31.  Patient transferred to [facility] service on 08/26/2019.  Urology was following. Hospital course remarkable for continued requirement of oxygen supplementation for maintenance of saturation.  PT/OT evaluated him and recommended skilled nursing facility on discharge.  Planning for skilled nursing facility placement.  Assessment & Plan:   Active Problems:   AMS (altered mental status)   Shock circulatory ([place])   Acute respiratory failure requiring reintubation ([place])   Acute hypoxic respiratory failure: Suspected to be from aspiration pneumonia.  He completed antibiotics.  Had to be intubated on presentation.  Extubated on 7/31.  He still on oxygen at 3-5 L vial cannula.  We will continue to monitor respiratory status.  Intermittent chest x-ray will be done. He was also given few doses of iv  Lasix for congestion.   We will continue oral Lasix on the scheduled doses. Last cX R showed improved aeration in the bilateral lungs with persistent upper lobe predominant infiltrates.  Continue supplemental oxygen as needed.  But continue to attempt to wean the oxygen too.  Looks like he qualified for home oxygen.  Acute metabolic encephalopathy: Secondary to hypoxia.  Mental status has been improving .  Today he was alert and oriented.  He has history of seizures , bipolar disorder, schizophrenia, neuropathy.  On Keppra,  Abilify, gabapentin.  Avoid sedating medications.  A. fib with RVR: Transient and resolved.Currently in normal sinus rhythm.  Most likely triggered with acute hypoxic respiratory failure.  He was treated with amiodarone.    Continue supportive care.  Not on anticoagulation.  Diastolic congestive heart failure: Echo showed normal left ventricular ejection fraction of 60 to 67%, grade 1 diastolic dysfunction.  He was given few doses of IV Lasix.  We will continue Lasix 40 mg daily.  Thrombocytopenia: Gradually improving without any signs of bleeding.  Suspected to be from cefepime/Pepcid or infectious etiology.  Normocytic anemia: Most likely associated with critical illness and other comorbidities. we will continue to monitor H&H.  Transfuse if necessary  Hypokalemia: Being monitored and supplemented as needed.  Hypernatremia: Mild.  Continue to monitor  Severe protein calorie malnutrition: Has a history of chronic pancreatitis.  Dietitian following.  Currently on dysphagia 3 diet.  On Creon for chronic pancreatitis  Urine retention/phimosis: Urology following.  Placed Foley catheter.  Recommended to continue Foley catheter until he follows with urology as an outpatient.  Diabetes mellitus with hyperglycemia: Currently on sliding scale insulin and novolog.  Monitor blood sugars  Debility/deconditioning/disposition: Patient seen by PT and OT and recommended SNF on discharge.  Social worker consulted.   Nutrition Problem: Severe Malnutrition Etiology: chronic illness (poorly controlled T2DM, pancreatitis)      DVT prophylaxis:SCD Code Status: Full Family Communication: Called and discussed with sister today on phone Status is: Inpatient   Dispo: The patient is from: Home              Anticipated d/c is to: Home with home health  Anticipated d/c date is: 1-2 days              Patient currently is medically stable for discharge to skilled nursing facility soon as bed is  available.   Consultants: PCCM  Procedures:Intubation  Antimicrobials:  Anti-infectives (From admission, onward)   Start     Dose/Rate Route Frequency Ordered Stop   08/17/19 2245  cefTRIAXone (ROCEPHIN) 1 g in sodium chloride 0.9 % 100 mL IVPB        1 g 200 mL/hr over 30 Minutes Intravenous Every 12 hours 08/17/19 2149 08/20/19 1126   08/17/2019 2330  vancomycin (VANCOREADY) IVPB 500 mg/100 mL  Status:  Discontinued        500 mg 100 mL/hr over 60 Minutes Intravenous Every 12 hours 07/27/2019 1056 08/15/19 1131   08/09/2019 2300  ceFEPIme (MAXIPIME) 2 g in sodium chloride 0.9 % 100 mL IVPB  Status:  Discontinued        2 g 200 mL/hr over 30 Minutes Intravenous Every 12 hours 08/01/2019 1056 08/17/19 1833   08/06/2019 1030  vancomycin (VANCOREADY) IVPB 1250 mg/250 mL        1,250 mg 166.7 mL/hr over 90 Minutes Intravenous  Once 08/01/2019 1019 08/03/2019 1454   07/26/2019 0945  ceFEPIme (MAXIPIME) 2 g in sodium chloride 0.9 % 100 mL IVPB        2 g 200 mL/hr over 30 Minutes Intravenous  Once 08/20/2019 0933 07/31/2019 1148   08/10/2019 0945  vancomycin (VANCOCIN) IVPB 1000 mg/200 mL premix  Status:  Discontinued        1,000 mg 200 mL/hr over 60 Minutes Intravenous  Once 07/25/2019 0933 07/26/2019 1019      Subjective: Patient seen and examined the bedside today.  Hemodynamically stable.  Alert and oriented.  Much more comfortable and coherent.  Still requiring some oxygen.  Very eager to go home to his sister.  I also discussed with the sister and she is agreeable for plan with skilled nursing facility placement.  Objective: Vitals:   08/27/19 0920 08/27/19 1722 08/27/19 2056 08/28/19 0436  BP: 101/72 139/70 103/84 97/72  Pulse: 77 83 83 80  Resp: _0 Temp: 98.2 F (36.8 C)  98 F (36.7 C) 98.2 F (36.8 C)  TempSrc:      SpO2: 93% 99% 93% 92%  Weight:      Height:        Intake/Output Summary (Last 24 hours) at 08/28/2019 0835 Last data filed at 08/28/2019 0436 Gross per 24 hour   Intake 690 ml  Output 2875 ml  Net -2185 ml   Filed Weights   08/24/19 0500 08/25/19 0450 08/26/19 0419  Weight: 61.5 kg 61.3 kg 62.2 kg    Examination:   General exam: Conditioning, debility, chronically looking but comfortable HEENT:PERRL,Oral mucosa moist, Ear/Nose normal on gross exam Respiratory system: Bilateral diminished air sounds but no crackles or wheezes cardiovascular system: S1 & S2 heard, RRR. No JVD, murmurs, rubs, gallops or clicks. Gastrointestinal system: Abdomen is nondistended, soft and nontender. No organomegaly or masses felt. Normal bowel sounds heard. Central nervous system: Alert and oriented. No focal neurological deficits. Extremities: No edema, no clubbing ,no cyanosis Skin: No rashes, lesions or ulcers,no icterus ,no pallor   Data Reviewed: I have personally reviewed following labs and imaging studies  CBC: Recent Labs  Lab 08/23/19 0447 08/24/19 0225 08/25/19 0500 08/26/19 0409 08/27/19 0434  WBC 16.2* 15.2* 13.5* 11.7* 8.8  NEUTROABS  --   --   --   --  6.7  HGB 9.6* 9.5* 8.8* 8.5* 9.2*  HCT 27.6* 27.3* 26.2* 25.6* 27.9*  MCV 91.1 91.6 93.2 94.5 95.9  PLT 82* 87* 90* 88* 94*   Basic Metabolic Panel: Recent Labs  Lab 08/22/19 0442 08/22/19 1122 08/24/19 0225 08/24/19 0225 08/25/19 0500 08/25/19 1611 08/26/19 0409 08/27/19 0434 08/28/19 0308  NA 149*   < > 146*   < > 148* 146* 147* 149* 142  K 3.7   < > 3.8   < > 2.7* 3.4* 3.4* 3.3* 4.0  CL 111   < > 111   < > 113* 107 107 111 105  CO2 29   < > 29   < > _0 GLUCOSE 212*   < > 197*   < > 154* 225* 89 141* 214*  BUN 13   < > 14   < > _1 CREATININE 0.57*   < > 0.49*   < > 0.55* 0.63 0.40* 0.59* 0.53*  CALCIUM 7.7*   < > 7.6*   < > 7.6* 7.7* 7.5* 7.8* 7.8*  MG 1.7  --  1.8  --  1.7  --  2.0  --   --   PHOS  --   --   --   --  3.2  --  4.2  --   --    < > = values in this interval not displayed.   GFR: Estimated Creatinine Clearance: 81.2 mL/min (A) (by  C-G formula based on SCr of 0.53 mg/dL (L)). Liver Function Tests: No results for input(s): AST, ALT, ALKPHOS, BILITOT, PROT, ALBUMIN in the last 168 hours. No results for input(s): LIPASE, AMYLASE in the last 168 hours. No results for input(s): AMMONIA in the last 168 hours. Coagulation Profile: No results for input(s): INR, PROTIME in the last 168 hours. Cardiac Enzymes: No results for input(s): CKTOTAL, CKMB, CKMBINDEX, TROPONINI in the last 168 hours. BNP (last 3 results) No results for input(s): PROBNP in the last 8760 hours. HbA1C: No results for input(s): HGBA1C in the last 72 hours. CBG: Recent Labs  Lab 08/27/19 1611 08/27/19 2057 08/27/19 2356 08/28/19 0435 08/28/19 0720  GLUCAP 148* 324* 324* 176* 182*   Lipid Profile: No results for input(s): CHOL, HDL, LDLCALC, TRIG, CHOLHDL, LDLDIRECT in the last 72 hours. Thyroid Function Tests: No results for input(s): TSH, T4TOTAL, FREET4, T3FREE, THYROIDAB in the last 72 hours. Anemia Panel: No results for input(s): VITAMINB12, FOLATE, FERRITIN, TIBC, IRON, RETICCTPCT in the last 72 hours. Sepsis Labs: No results for input(s): PROCALCITON, LATICACIDVEN in the last 168 hours.  Recent Results (from the past 240 hour(s))  Culture, respiratory (non-expectorated)     Status: None   Collection Time: 08/21/19 11:34 AM   Specimen: Tracheal Aspirate; Respiratory  Result Value Ref Range Status   Specimen Description TRACHEAL ASPIRATE  Final   Special Requests ENDOTRACHEAL  Final   Gram Stain   Final    RARE WBC PRESENT,BOTH PMN AND MONONUCLEAR RARE BUDDING YEAST SEEN RARE GRAM POSITIVE COCCI IN PAIRS    Culture   Final    Consistent with normal respiratory flora. Performed at [facility], [facility] [address] [place], [place] [address]    Report Status 08/23/2019 FINAL  Final         Radiology Studies: DG CHEST PORT 1 VIEW  Result Date: 08/27/2019 CLINICAL DATA:  Pt states he feels SOB x 4 days Pt c/o nausea and  lightheadedness  Diabetic, smoker EXAM: PORTABLE CHEST 1 VIEW COMPARISON:  Chest radiograph 08/25/2019 FINDINGS: Interval removal of a left central venous catheter. There is improved aeration in the bilateral lungs with persistent upper lobe predominant infiltrates. No pneumothorax or large pleural effusion. No acute finding in the visualized skeleton. IMPRESSION: Improved aeration in the bilateral lungs with persistent upper lobe predominant infiltrates. Electronically Signed   By: Audie Pinto M.D.   On: 08/27/2019 14:29        Scheduled Meds: . ARIPiprazole  1 mg Oral QHS  . chlorhexidine gluconate (MEDLINE KIT)  15 mL Mouth Rinse BID  . Chlorhexidine Gluconate Cloth  6 each Topical Q0600  . feeding supplement (ENSURE ENLIVE)  237 mL Oral TID WC  . free water  300 mL Oral Q8H  . furosemide  40 mg Oral Daily  . gabapentin  300 mg Oral BID  . insulin aspart  0-9 Units Subcutaneous Q4H  . levETIRAcetam  500 mg Oral BID  . lipase/protease/amylase  36,000 Units Oral TID AC  . nicotine  21 mg Transdermal Daily  . potassium chloride  40 mEq Oral BID  . sodium chloride flush  10-40 mL Intracatheter Q12H   Continuous Infusions: . sodium chloride 10 mL/hr at 08/25/19 0640     LOS: 14 days    Time spent:25 mins. More than 50% of that time was spent in counseling and/or coordination of care.      Shelly Coss, MD [facility] P8/05/2019, 8:35 AM

## 2019-08-28 NOTE — Plan of Care (Signed)
  Problem: Clinical Measurements: Goal: Respiratory complications will improve Outcome: Progressing   Problem: Activity: Goal: Risk for activity intolerance will decrease Outcome: Progressing   

## 2019-08-28 NOTE — Progress Notes (Signed)
SATURATION QUALIFICATIONS: (This note is used to comply with regulatory documentation for home oxygen)  Patient Saturations on Room Air at Rest = 87%  Patient Saturations on Room Air while Ambulating = 0 %  Patient Saturations on 0 Liters of oxygen while Ambulating = 0 %  Please briefly explain why patient needs home oxygen:  Patient claims to use oxygen per [place] at home. Right now patient is on 5 L /East  with oxygen  sat 95%. Oxygen saturations drop significantly the 87 %  without using oxygen.  Unable to ambulate.

## 2019-08-28 NOTE — Progress Notes (Signed)
Inpatient Diabetes Program Recommendations  AACE/ADA: New Consensus Statement on Inpatient Glycemic Control (2015)  Target Ranges:  Prepandial:   less than 140 mg/dL      Peak postprandial:   less than 180 mg/dL (1-2 hours)      Critically ill patients:  140 - 180 mg/dL   Lab Results  Component Value Date   GLUCAP 182 (H) 08/28/2019   HGBA1C 15.3 (H) 08/15/2019   Results for Michael [name], Michael [name] (MRN [id number]) as of 08/28/2019 10:35  Ref. Range 08/27/2019 07:49 08/27/2019 11:21 08/27/2019 16:11 08/27/2019 20:57 08/27/2019 23:56 08/28/2019 04:35 08/28/2019 07:20  Glucose-Capillary Latest Ref Range: 70 - 99 mg/dL 128 (H) 157 (H) 148 (H) 324 (H) 324 (H) 176 (H) 182 (H)    MD- Please consider:  1. Change Novolog SSI timing to tid ac + hs (currently ordered Q4 hours)  2. Start Novolog Meal Coverage: Novolog 2 units TID with meals  (Please add the following Hold Parameters: Hold if pt eats <50% of meal, Hold if pt NPO)  Thank you, Bethena Roys E. Dwaine Pringle, RN, MSN, CDE  Diabetes Coordinator Inpatient Glycemic Control Team Team Pager [phone] (8am-5pm) 08/28/2019 10:36 AM

## 2019-08-28 NOTE — Progress Notes (Signed)
  Speech Language Pathology Treatment: Dysphagia  Patient Details Name: Michael [name] MRN: [id number] DOB: 01/15/1956 Today's Date: 08/28/2019 Time: 9379-0240 SLP Time Calculation (min) (ACUTE ONLY): 13 min  Assessment / Plan / Recommendation Clinical Impression  Mr. Liddy was somewhat agitated upon SLP arrival, shouting, "I don't want to talk--I want to go home." Pt redirected. New CXR shows improved aeration, but ongoing upper lobe infection (not consistent with aspiration). He was seen with nectar thick liquid juice, taking single sips without cues required. He had no s/s aspiration this date. Pt was educated on benefits of thickener and reports he has the thickening powder at home and doesn't need any more. Dysphagia exercises were attempted to be initiated with pt, who was too agitated to participate during this session. ST service to follow x1 more to attempt exercises/rehabilitation.   HPI HPI: 64 year old with history of noncompliance insulin-dependent diabetes mellitus who presents 07/24/2019 with seizure activity, altered mental status, hyperglycemia, and hypokalemic. Pt with prolonged intubation, across multiple intubations, with self extubation 7/31.  Pt known to this service from prior admission during which MBSS on 03/14/19 revealed a "moderate oropharyngeal phase dysphagia characterized by impaired bolus control and a pharyngeal delay which resulted in penetration and aspiration" with pt intermittently sensate to aspiration.  Pt was placed on puree diet with honey thick liquid in February.  Pt states that he has returned to regular, unmodified textures at home. New MBSS this stay showed aspiration of thin, penetration of nectar (transient), and no difficulty with honey, but pt fairly opposed to honey upon discussion. Now on nectar and dysphagia 3.      SLP Plan  Continue with current plan of care       Recommendations  Diet recommendations: Dysphagia 3 (mechanical soft);Nectar-thick  liquid Liquids provided via: Cup Medication Administration: Whole meds with puree Supervision: Patient able to self feed;Intermittent supervision to cue for compensatory strategies Compensations: Slow rate;Small sips/bites Postural Changes and/or Swallow Maneuvers: Seated upright 90 degrees;Upright 30-60 min after meal                Oral Care Recommendations: Oral care BID Follow up Recommendations: Home health SLP SLP Visit Diagnosis: Dysphagia, oropharyngeal phase (R13.12) Plan: Continue with current plan of care                     Perrie Ragin P. Graycen Sadlon, M.S., CCC-SLP Speech-Language Pathologist Acute Rehabilitation Services Pager: [place] 08/28/2019, 11:26 AM

## 2019-08-29 DIAGNOSIS — J9600 Acute respiratory failure, unspecified whether with hypoxia or hypercapnia: Secondary | ICD-10-CM | POA: Diagnosis not present

## 2019-08-29 LAB — BASIC METABOLIC PANEL
Anion gap: 10 (ref 5–15)
BUN: 11 mg/dL (ref 8–23)
CO2: 30 mmol/L (ref 22–32)
Calcium: 8 mg/dL — ABNORMAL LOW (ref 8.9–10.3)
Chloride: 107 mmol/L (ref 98–111)
Creatinine, Ser: 0.5 mg/dL — ABNORMAL LOW (ref 0.61–1.24)
GFR calc Af Amer: >60 mL/min (ref >60)
GFR calc non Af Amer: >60 mL/min (ref >60)
Glucose, Bld: 149 mg/dL — ABNORMAL HIGH (ref 70–99)
Potassium: 4 mmol/L (ref 3.5–5.1)
Sodium: 147 mmol/L — ABNORMAL HIGH (ref 135–145)

## 2019-08-29 LAB — GLUCOSE, CAPILLARY
Glucose-Capillary: 137 mg/dL — ABNORMAL HIGH (ref 70–99)
Glucose-Capillary: 150 mg/dL — ABNORMAL HIGH (ref 70–99)
Glucose-Capillary: 174 mg/dL — ABNORMAL HIGH (ref 70–99)
Glucose-Capillary: 203 mg/dL — ABNORMAL HIGH (ref 70–99)
Glucose-Capillary: 229 mg/dL — ABNORMAL HIGH (ref 70–99)
Glucose-Capillary: 237 mg/dL — ABNORMAL HIGH (ref 70–99)

## 2019-08-29 NOTE — Progress Notes (Signed)
Inpatient Diabetes Program Recommendations  AACE/ADA: New Consensus Statement on Inpatient Glycemic Control (2015)  Target Ranges:  Prepandial:   less than 140 mg/dL      Peak postprandial:   less than 180 mg/dL (1-2 hours)      Critically ill patients:  140 - 180 mg/dL   Lab Results  Component Value Date   GLUCAP 229 (H) 08/29/2019   HGBA1C 15.3 (H) 08/15/2019    Review of Glycemic Control Results for LAYNE, DILAURO (MRN [id number]) as of 08/29/2019 12:51  Ref. Range 08/28/2019 20:36 08/29/2019 00:17 08/29/2019 04:33 08/29/2019 07:09 08/29/2019 11:15  Glucose-Capillary Latest Ref Range: 70 - 99 mg/dL 278 (H) 237 (H) 150 (H) 137 (H) 229 (H)   Inpatient Diabetes Program Recommendations:   Consider increase in Meal coverage to 3 units tid if eats 50%  Thank you, Nani Gasser. Ruthmary Occhipinti, RN, MSN, CDE  Diabetes Coordinator Inpatient Glycemic Control Team Team Pager [phone] (8am-5pm) 08/29/2019 12:52 PM

## 2019-08-29 NOTE — Progress Notes (Signed)
Physical Therapy Treatment Patient Details Name: Michael [name] MRN: [id number] DOB: 04-Oct-1955 Today's Date: 08/29/2019    History of Present Illness Pt is a 64 year old man with history of bipolar disease, schizophrenia, chronic pancreatitis, hep B, diabetes.  He was admitted with encephalopathy and pneumonia, required intubation, mechanical ventilation, pressors.  Course complicated by atrial fibrillation.  He was extubated 7/31    PT Comments    Attempted multiple times to have pt sit upright at EOB, which initially he would agree to but then never perform the task despite multimodal cueing. Pt also very adamant that he did not want therapist to assist him. Pt finally agreeable to bed level bilateral LE strengthening therex. Pt would continue to benefit from skilled physical therapy services at this time while admitted and after d/c to address the below listed limitations in order to improve overall safety and independence with functional mobility.    Follow Up Recommendations  SNF     Equipment Recommendations  None recommended by PT    Recommendations for Other Services       Precautions / Restrictions Precautions Precautions: Fall Restrictions Weight Bearing Restrictions: No    Mobility  Bed Mobility               General bed mobility comments: attempted to have pt sit upright at EOB; pt seemingly agreeable intermittently but then never following through. Did not want assistance from therapist.   Transfers                    Ambulation/Gait                 Stairs             Wheelchair Mobility    Modified Rankin (Stroke Patients Only)       Balance                                            Cognition Arousal/Alertness: Lethargic Behavior During Therapy: WFL for tasks assessed/performed Overall Cognitive Status: Impaired/Different from baseline Area of Impairment: Memory;Following  commands;Safety/judgement;Awareness;Problem solving                     Memory: Decreased short-term memory Following Commands: Follows one step commands inconsistently Safety/Judgement: Decreased awareness of safety;Decreased awareness of deficits Awareness: Intellectual Problem Solving: Slow processing;Decreased initiation;Difficulty sequencing;Requires verbal cues;Requires tactile cues        Exercises General Exercises - Lower Extremity Ankle Circles/Pumps: AROM;Both;20 reps;Supine Heel Slides: AAROM;Both;10 reps;Supine Hip ABduction/ADduction: AAROM;Both;10 reps;Supine Straight Leg Raises: AAROM;Both;10 reps;Supine Other Exercises Other Exercises: bilateral LE glute bridges x5    General Comments        Pertinent Vitals/Pain Pain Assessment: No/denies pain    Home Living                      Prior Function            PT Goals (current goals can now be found in the care plan section) Acute Rehab PT Goals PT Goal Formulation: Patient unable to participate in goal setting Time For Goal Achievement: 09/08/19 Potential to Achieve Goals: Fair Progress towards PT goals: Not progressing toward goals - comment    Frequency    Min 3X/week      PT Plan Current plan remains appropriate    Co-evaluation  AM-PAC PT "6 Clicks" Mobility   Outcome Measure  Help needed turning from your back to your side while in a flat bed without using bedrails?: A Little Help needed moving from lying on your back to sitting on the side of a flat bed without using bedrails?: A Little Help needed moving to and from a bed to a chair (including a wheelchair)?: A Lot Help needed standing up from a chair using your arms (e.g., wheelchair or bedside chair)?: A Lot Help needed to walk in hospital room?: A Lot Help needed climbing 3-5 steps with a railing? : Total 6 Click Score: 13    End of Session Equipment Utilized During Treatment: Oxygen Activity  Tolerance: Patient limited by fatigue;Patient limited by lethargy;Other (comment) (?self-limiting versus cognition) Patient left: in bed;with call bell/phone within reach;with bed alarm set Nurse Communication: Mobility status PT Visit Diagnosis: Unsteadiness on feet (R26.81);Other abnormalities of gait and mobility (R26.89);Difficulty in walking, not elsewhere classified (R26.2)     Time: 0352-4818 PT Time Calculation (min) (ACUTE ONLY): 13 min  Charges:  $Therapeutic Exercise: 8-22 mins                     Anastasio Champion, DPT  Acute Rehabilitation Services Pager [phone] Office [place] 08/29/2019, 10:00 AM

## 2019-08-29 NOTE — Progress Notes (Addendum)
PROGRESS NOTE    Michael [name]  JKK:[id number] DOB: [birth year] DOA: 07/26/2019 PCP: [facility]   Brief Narrative:  Patient is a 64 year old male with history of bipolar disease, schizophrenia, chronic pancreatitis, hepatitis B, diabetes type 2 who presented with confusion, shortness of breath.  He was found to be encephalopathic, hypoxic on presentation.  Work-up revealed pneumonia.  He was intubated for acute hypoxic respiratory failure and was put on vent.  Hospital course remarkable for atrial fibrillation, urinary retention due to phimosis, dysphagia.  He was extubated on 7/31.  Patient transferred to [facility] service on 08/26/2019.  Urology was following. Hospital course remarkable for continued requirement of oxygen supplementation for maintenance of saturation.  PT/OT evaluated him and recommended skilled nursing facility on discharge.  Planning for skilled nursing facility placement. 08/29/2019: Patient seen.  No new changes.  Patient is improving slowly.  Available lab records reviewed.  Albumin was 1.4 on 08/19/2019.  Patient has likely sacral pressure ulcer that is currently covered.  The staging of the sacral ulcer is not known.  Healing will be challenging in the face of very low albumin.  Continue adequate nutrition.    Assessment & Plan:   Active Problems:   AMS (altered mental status)   Shock circulatory ([place])   Acute respiratory failure requiring reintubation ([place])   Acute hypoxic respiratory failure: Suspected to be from aspiration pneumonia.  He completed antibiotics.  Had to be intubated on presentation.  Extubated on 7/31.  He still on oxygen at 3-5 L vial cannula.  We will continue to monitor respiratory status.  Intermittent chest x-ray will be done. He was also given few doses of iv  Lasix for congestion.   We will continue oral Lasix on the scheduled doses. Last cX R showed improved aeration in the bilateral lungs with persistent upper lobe predominant infiltrates.   Continue supplemental oxygen as needed.  But continue to attempt to wean the oxygen too.  Looks like he qualified for home oxygen. 08/29/2019: Continue to wean down supplemental oxygen.  Keep O2 sat greater than 91%.  Acute metabolic encephalopathy: Secondary to hypoxia.  Mental status has been improving .  Today he was alert and oriented.  He has history of seizures , bipolar disorder, schizophrenia, neuropathy.  On Keppra, Abilify, gabapentin.  Avoid sedating medications. 08/29/2019: Patient's baseline mental state is nonnontreatment.  Continue to assess.  Collateral information will be most useful.  A. fib with RVR: Transient and resolved.Currently in normal sinus rhythm.  Most likely triggered with acute hypoxic respiratory failure.  He was treated with amiodarone.    Continue supportive care.  Not on anticoagulation.  Diastolic congestive heart failure: Echo showed normal left ventricular ejection fraction of 60 to 71%, grade 1 diastolic dysfunction.  He was given few doses of IV Lasix.  We will continue Lasix 40 mg daily.  Thrombocytopenia: Gradually improving without any signs of bleeding.  Suspected to be from cefepime/Pepcid or infectious etiology.  Normocytic anemia: Most likely associated with critical illness and other comorbidities. we will continue to monitor H&H.  Transfuse if necessary  Hypokalemia:  Being monitored and supplemented as needed. 09/18/2019: Potassium today is 4.  Hypernatremia: Mild.  Continue to monitor 08/29/2019: Encourage adequate oral intake.  Sodium today is 147.  Severe protein calorie malnutrition: Has a history of chronic pancreatitis.  Dietitian following.  Currently on dysphagia 3 diet.  On Creon for chronic pancreatitis  Urine retention/phimosis: Urology following.  Placed Foley catheter.  Recommended to continue  Foley catheter until he follows with urology as an outpatient.  Diabetes mellitus with hyperglycemia: Currently on sliding scale insulin and  novolog.  Monitor blood sugars  Debility/deconditioning/disposition: Patient seen by PT and OT and recommended SNF on discharge.  Social worker consulted.   Nutrition Problem: Severe Malnutrition Etiology: chronic illness (poorly controlled T2DM, pancreatitis)      DVT prophylaxis:SCD Code Status: Full Family Communication: Called and discussed with sister today on phone Status is: Inpatient   Dispo: The patient is from: Home              Anticipated d/c is to: Home with home health              Anticipated d/c date is: 1-2 days              Patient currently is medically stable for discharge to skilled nursing facility soon as bed is available.   Consultants: PCCM  Procedures:Intubation  Antimicrobials:  Anti-infectives (From admission, onward)   Start     Dose/Rate Route Frequency Ordered Stop   08/17/19 2245  cefTRIAXone (ROCEPHIN) 1 g in sodium chloride 0.9 % 100 mL IVPB        1 g 200 mL/hr over 30 Minutes Intravenous Every 12 hours 08/17/19 2149 08/20/19 1126   08/22/2019 2330  vancomycin (VANCOREADY) IVPB 500 mg/100 mL  Status:  Discontinued        500 mg 100 mL/hr over 60 Minutes Intravenous Every 12 hours 08/06/2019 1056 08/15/19 1131   07/26/2019 2300  ceFEPIme (MAXIPIME) 2 g in sodium chloride 0.9 % 100 mL IVPB  Status:  Discontinued        2 g 200 mL/hr over 30 Minutes Intravenous Every 12 hours 08/02/2019 1056 08/17/19 1833   08/09/2019 1030  vancomycin (VANCOREADY) IVPB 1250 mg/250 mL        1,250 mg 166.7 mL/hr over 90 Minutes Intravenous  Once 08/03/2019 1019 08/20/2019 1454   08/15/2019 0945  ceFEPIme (MAXIPIME) 2 g in sodium chloride 0.9 % 100 mL IVPB        2 g 200 mL/hr over 30 Minutes Intravenous  Once 08/13/2019 0933 08/11/2019 1148   08/19/2019 0945  vancomycin (VANCOCIN) IVPB 1000 mg/200 mL premix  Status:  Discontinued        1,000 mg 200 mL/hr over 60 Minutes Intravenous  Once 07/30/2019 0933 08/01/2019 1019      Subjective: Patient seen and examined the bedside  today.  Hemodynamically stable.  Alert and oriented.  Much more comfortable and coherent.  Still requiring some oxygen.  Very eager to go home to his sister.  I also discussed with the sister and she is agreeable for plan with skilled nursing facility placement.  Objective: Vitals:   08/28/19 1626 08/28/19 2035 08/29/19 0430 08/29/19 0939  BP: 129/73 112/81 121/79 (!) 153/75  Pulse: 79 85 75 79  Resp:  16 15 18   Temp:  28.0 F (36.8 C) 98 F (36.7 C)   TempSrc: Axillary     SpO2: 98% 100% 100% 97%  Weight:      Height:        Intake/Output Summary (Last 24 hours) at 08/29/2019 1210 Last data filed at 08/29/2019 0905 Gross per 24 hour  Intake 1294 ml  Output 2000 ml  Net -706 ml   Filed Weights   08/24/19 0500 08/25/19 0450 08/26/19 0419  Weight: 61.5 kg 61.3 kg 62.2 kg    Examination:   General exam: Not in any distress.  Patient is awake and alert. HEENT: Pallor. No jaundice.  Respiratory system: Adequate air entry. Inspiratory and expiratory wheeze, ? end inspiratory wheeze, with added sounds.  Cardiovascular system: S1 & S2. Gastrointestinal system: Abdomen is nondistended, soft and nontender. No organomegaly or masses felt. Normal bowel sounds heard. Central nervous system: Alert and oriented. No focal neurological deficits. Extremities: No edema, no clubbing ,no cyanosis Skin: No rashes, lesions or ulcers,no icterus ,no pallor   Data Reviewed: I have personally reviewed following labs and imaging studies  CBC: Recent Labs  Lab 08/23/19 0447 08/24/19 0225 08/25/19 0500 08/26/19 0409 08/27/19 0434  WBC 16.2* 15.2* 13.5* 11.7* 8.8  NEUTROABS  --   --   --   --  6.7  HGB 9.6* 9.5* 8.8* 8.5* 9.2*  HCT 27.6* 27.3* 26.2* 25.6* 27.9*  MCV 91.1 91.6 93.2 94.5 95.9  PLT 82* 87* 90* 88* 94*   Basic Metabolic Panel: Recent Labs  Lab 08/24/19 0225 08/24/19 0225 08/25/19 0500 08/25/19 0500 08/25/19 1611 08/26/19 0409 08/27/19 0434 08/28/19 0308 08/29/19 0734    NA 146*   < > 148*   < > 146* 147* 149* 142 147*  K 3.8   < > 2.7*   < > 3.4* 3.4* 3.3* 4.0 4.0  CL 111   < > 113*   < > 107 107 111 105 107  CO2 29   < > 28   < > 28 30 28 24 30   GLUCOSE 197*   < > 154*   < > 225* 89 141* 214* 149*  BUN 14   < > 15   < > 10 11 13 11 11   CREATININE 0.49*   < > 0.55*   < > 0.63 0.40* 0.59* 0.53* 0.50*  CALCIUM 7.6*   < > 7.6*   < > 7.7* 7.5* 7.8* 7.8* 8.0*  MG 1.8  --  1.7  --   --  2.0  --   --   --   PHOS  --   --  3.2  --   --  4.2  --   --   --    < > = values in this interval not displayed.   GFR: Estimated Creatinine Clearance: 81.2 mL/min (A) (by C-G formula based on SCr of 0.5 mg/dL (L)). Liver Function Tests: No results for input(s): AST, ALT, ALKPHOS, BILITOT, PROT, ALBUMIN in the last 168 hours. No results for input(s): LIPASE, AMYLASE in the last 168 hours. No results for input(s): AMMONIA in the last 168 hours. Coagulation Profile: No results for input(s): INR, PROTIME in the last 168 hours. Cardiac Enzymes: No results for input(s): CKTOTAL, CKMB, CKMBINDEX, TROPONINI in the last 168 hours. BNP (last 3 results) No results for input(s): PROBNP in the last 8760 hours. HbA1C: No results for input(s): HGBA1C in the last 72 hours. CBG: Recent Labs  Lab 08/28/19 2036 08/29/19 0017 08/29/19 0433 08/29/19 0709 08/29/19 1115  GLUCAP 278* 237* 150* 137* 229*   Lipid Profile: No results for input(s): CHOL, HDL, LDLCALC, TRIG, CHOLHDL, LDLDIRECT in the last 72 hours. Thyroid Function Tests: No results for input(s): TSH, T4TOTAL, FREET4, T3FREE, THYROIDAB in the last 72 hours. Anemia Panel: No results for input(s): VITAMINB12, FOLATE, FERRITIN, TIBC, IRON, RETICCTPCT in the last 72 hours. Sepsis Labs: No results for input(s): PROCALCITON, LATICACIDVEN in the last 168 hours.  Recent Results (from the past 240 hour(s))  Culture, respiratory (non-expectorated)     Status: None   Collection Time: 08/21/19  11:34 AM   Specimen: Tracheal  Aspirate; Respiratory  Result Value Ref Range Status   Specimen Description TRACHEAL ASPIRATE  Final   Special Requests ENDOTRACHEAL  Final   Gram Stain   Final    RARE WBC PRESENT,BOTH PMN AND MONONUCLEAR RARE BUDDING YEAST SEEN RARE GRAM POSITIVE COCCI IN PAIRS    Culture   Final    Consistent with normal respiratory flora. Performed at [facility], [place] [address], [place], [place] [address]    Report Status 08/23/2019 FINAL  Final         Radiology Studies: DG CHEST PORT 1 VIEW  Result Date: 08/27/2019 CLINICAL DATA:  Pt states he feels SOB x 4 days Pt c/o nausea and lightheadedness Diabetic, smoker EXAM: PORTABLE CHEST 1 VIEW COMPARISON:  Chest radiograph 08/25/2019 FINDINGS: Interval removal of a left central venous catheter. There is improved aeration in the bilateral lungs with persistent upper lobe predominant infiltrates. No pneumothorax or large pleural effusion. No acute finding in the visualized skeleton. IMPRESSION: Improved aeration in the bilateral lungs with persistent upper lobe predominant infiltrates. Electronically Signed   By: Audie Pinto M.D.   On: 08/27/2019 14:29        Scheduled Meds: . ARIPiprazole  1 mg Oral QHS  . chlorhexidine gluconate (MEDLINE KIT)  15 mL Mouth Rinse BID  . Chlorhexidine Gluconate Cloth  6 each Topical Q0600  . feeding supplement (ENSURE ENLIVE)  237 mL Oral TID WC  . free water  300 mL Oral Q8H  . furosemide  40 mg Oral Daily  . gabapentin  300 mg Oral BID  . insulin aspart  0-5 Units Subcutaneous QHS  . insulin aspart  0-9 Units Subcutaneous TID WC  . insulin aspart  2 Units Subcutaneous TID WC  . levETIRAcetam  500 mg Oral BID  . lipase/protease/amylase  36,000 Units Oral TID AC  . nicotine  21 mg Transdermal Daily  . sodium chloride flush  10-40 mL Intracatheter Q12H   Continuous Infusions: . sodium chloride 10 mL/hr at 08/25/19 0640     LOS: 15 days    Time spent: 35 mins.   Bonnell Public,  MD [facility] P8/06/2019, 12:10 PM

## 2019-08-30 DIAGNOSIS — J9600 Acute respiratory failure, unspecified whether with hypoxia or hypercapnia: Secondary | ICD-10-CM | POA: Diagnosis not present

## 2019-08-30 DIAGNOSIS — R627 Adult failure to thrive: Secondary | ICD-10-CM

## 2019-08-30 DIAGNOSIS — Z515 Encounter for palliative care: Secondary | ICD-10-CM | POA: Diagnosis not present

## 2019-08-30 DIAGNOSIS — Z7189 Other specified counseling: Secondary | ICD-10-CM | POA: Diagnosis not present

## 2019-08-30 DIAGNOSIS — Z66 Do not resuscitate: Secondary | ICD-10-CM

## 2019-08-30 DIAGNOSIS — R29898 Other symptoms and signs involving the musculoskeletal system: Secondary | ICD-10-CM

## 2019-08-30 DIAGNOSIS — E43 Unspecified severe protein-calorie malnutrition: Secondary | ICD-10-CM

## 2019-08-30 LAB — BLOOD GAS, ARTERIAL
Acid-Base Excess: 7.9 mmol/L — ABNORMAL HIGH (ref 0.0–2.0)
Bicarbonate: 31.5 mmol/L — ABNORMAL HIGH (ref 20.0–28.0)
Drawn by: 44898
FIO2: 32
O2 Saturation: 96.2 %
Patient temperature: 36.7
pCO2 arterial: 40.5 mmHg (ref 32.0–48.0)
pH, Arterial: 7.501 — ABNORMAL HIGH (ref 7.350–7.450)
pO2, Arterial: 77.9 mmHg — ABNORMAL LOW (ref 83.0–108.0)

## 2019-08-30 LAB — GLUCOSE, CAPILLARY
Glucose-Capillary: 316 mg/dL — ABNORMAL HIGH (ref 70–99)
Glucose-Capillary: 160 mg/dL — ABNORMAL HIGH (ref 70–99)
Glucose-Capillary: <10 mg/dL — CL (ref 70–99)
Glucose-Capillary: 103 mg/dL — ABNORMAL HIGH (ref 70–99)
Glucose-Capillary: 45 mg/dL — ABNORMAL LOW (ref 70–99)
Glucose-Capillary: 114 mg/dL — ABNORMAL HIGH (ref 70–99)

## 2019-08-30 MED ORDER — PREDNISONE 20 MG PO TABS
40.0000 mg | ORAL_TABLET | Freq: Every day | ORAL | Status: DC
  Administered 2019-08-30 – 2019-08-31 (×2): 40 mg via ORAL
  Filled 2019-08-30 (×2): qty 2

## 2019-08-30 MED ORDER — DEXTROSE 50 % IV SOLN
INTRAVENOUS | Status: AC
  Administered 2019-08-30: 50 mL
  Filled 2019-08-30: qty 50

## 2019-08-30 MED ORDER — PANTOPRAZOLE SODIUM 40 MG PO TBEC
40.0000 mg | DELAYED_RELEASE_TABLET | Freq: Every day | ORAL | Status: DC
  Administered 2019-08-30 – 2019-09-01 (×3): 40 mg via ORAL
  Filled 2019-08-30 (×3): qty 1

## 2019-08-30 MED ORDER — IPRATROPIUM-ALBUTEROL 0.5-2.5 (3) MG/3ML IN SOLN
3.0000 mL | Freq: Two times a day (BID) | RESPIRATORY_TRACT | Status: DC
  Administered 2019-08-31 – 2019-09-02 (×5): 3 mL via RESPIRATORY_TRACT
  Filled 2019-08-30 (×5): qty 3

## 2019-08-30 MED ORDER — IPRATROPIUM-ALBUTEROL 0.5-2.5 (3) MG/3ML IN SOLN
3.0000 mL | Freq: Four times a day (QID) | RESPIRATORY_TRACT | Status: DC
  Administered 2019-08-30: 3 mL via RESPIRATORY_TRACT
  Filled 2019-08-30 (×2): qty 3

## 2019-08-30 MED ORDER — BUDESONIDE 0.25 MG/2ML IN SUSP
0.2500 mg | Freq: Two times a day (BID) | RESPIRATORY_TRACT | Status: DC
  Administered 2019-08-31 – 2019-09-03 (×7): 0.25 mg via RESPIRATORY_TRACT
  Filled 2019-08-30 (×8): qty 2

## 2019-08-30 MED ORDER — CHLORHEXIDINE GLUCONATE 0.12 % MT SOLN
OROMUCOSAL | Status: AC
  Filled 2019-08-30: qty 15

## 2019-08-30 MED ORDER — DEXTROSE 50 % IV SOLN: 1.0000 | Freq: Once | INTRAVENOUS | Status: AC | PRN

## 2019-08-30 MED ORDER — SODIUM CHLORIDE 0.9 % IV SOLN: INTRAVENOUS | Status: DC

## 2019-08-30 MED ORDER — DEXTROSE 50 % IV SOLN: 25.0000 mL | Freq: Once | INTRAVENOUS | Status: DC

## 2019-08-30 MED ORDER — BUDESONIDE 0.25 MG/2ML IN SUSP: 0.2500 mg | Freq: Two times a day (BID) | RESPIRATORY_TRACT | Status: DC

## 2019-08-30 NOTE — Social Work (Cosign Needed Addendum)
To Whom it May Concern, Please be advised that the above named patient will require a short term nursing home stay. Anticipated to be 30 days or less for rehabilitation and strengthening. The plan is for return home.    Michael [name] April 27, 1955

## 2019-08-30 NOTE — Progress Notes (Signed)
Hypoglycemic Event  CBG: <10  Treatment: D50 50 mL (25 gm)  Symptoms: Sweaty and Shaky  Follow-up CBG: Time:1645 CBG Result:103  Possible Reasons for Event: Inadequate meal intake    Michael [name] [name]

## 2019-08-30 NOTE — Progress Notes (Signed)
RT to give scheduled treatment. Upon arrival to room pt sleeping. RT attempted to wake pt from treatment in which he aroused and mumbled. RT attempted to place aerosol mask on pt multiple times in which he flailed his arms. Mask removed at this time. Pt with rhonchi bilaterally with a weak cough. Nasal cannula weaned form 3L to 2L oxygen at this time. Pt remains in no distress at this time. RT will continue to monitor.

## 2019-08-30 NOTE — Progress Notes (Signed)
CRITICAL VALUE ALERT  Critical Value: CBG  Date & Time Notied:  08/30/2019  2115  Provider Notified: Marlowe Sax  Orders Received/Actions taken: 1/2 amp D50 administered, pt alert so thickened orange juice given.  Orders received; pt to be transferred to higher level of care for frequent CBG monitoring.

## 2019-08-30 NOTE — Progress Notes (Addendum)
Received referral to assist with outpt palliative. Attempted to meet with pt to discuss referral but he is asleep. Contacted pt's sister Michael [name]) at [phone] to discuss preference for an agency. She chose [facility] [place] for referral.

## 2019-08-30 NOTE — Progress Notes (Signed)
PROGRESS NOTE    [name]  NOT:[id number] DOB: [birth year] DOA: 08/20/2019 PCP: [facility]   Brief Narrative:  Patient is a 64 year old male with history of bipolar disease, schizophrenia, chronic pancreatitis, hepatitis B, diabetes type 2 admitted with altered mental status, undocumented shortness of breath.  Blood sugar on presentation was greater than 600.  Work-up revealed acute hypoxemic respiratory failure, bilateral pneumonia likely secondary to aspiration, septic shock, diabetes mellitus with ketoacidosis, severe hypokalemia, severe volume depletion, encephalopathy and possible seizure.  He was intubated for acute hypoxic respiratory failure and was put on vent.  Hospital course remarkable for atrial fibrillation, urinary retention due to phimosis, dysphagia.  He was extubated on 7/31.  Patient transferred to [facility] service on 08/26/2019.  Urology was following. Hospital course remarkable for continued requirement of oxygen supplementation for maintenance of saturation.  PT/OT evaluated him and recommended skilled nursing facility on discharge.  Planning for skilled nursing facility placement.  08/29/2019: Patient seen.  No new changes.  Patient is improving slowly.  Available lab records reviewed.  Albumin was 1.4 on 08/19/2019.  Patient has likely sacral pressure ulcer that is currently covered.  The staging of the sacral ulcer is not known.  Healing will be challenging in the face of very low albumin.  Patient needs adequate nutrition.    08/30/2019: Patient seen today.  Patient reports feeling sleepy.  Patient did not finish his lunch.  Will start caloric count.  Will consult dietary team.  Patient's prognosis is guarded.  Will consult palliative care team.  Assessment & Plan:   Active Problems:   AMS (altered mental status)   Shock circulatory ([place])   Acute respiratory failure requiring reintubation ([place])   Acute hypoxic respiratory failure: Suspected to be from aspiration  pneumonia.  He completed antibiotics.  Had to be intubated on presentation.  Extubated on 7/31.  He still on oxygen at 3-5 L vial cannula.  We will continue to monitor respiratory status.  Intermittent chest x-ray will be done. He was also given few doses of iv  Lasix for congestion.   We will continue oral Lasix on the scheduled doses. Last cX R showed improved aeration in the bilateral lungs with persistent upper lobe predominant infiltrates.  Continue supplemental oxygen as needed.  But continue to attempt to wean the oxygen too.  Looks like he qualified for home oxygen. 08/30/2019: Continue to wean down supplemental oxygen.  Keep O2 sat greater than 91%.  Patient is currently on supplemental oxygen 3 L/min via nasal cannula  Acute metabolic encephalopathy: Secondary to hypoxia.  Mental status has been improving .  Today he was alert and oriented.  He has history of seizures , bipolar disorder, schizophrenia, neuropathy.  On Keppra, Abilify, gabapentin.  Avoid sedating medications. 08/29/2019: Patient's baseline mental state is nonnontreatment.  Continue to assess.  Collateral information will be most useful. 08/30/2019: Seems stable.  Able to converse.  A. fib with RVR: Transient and resolved.Currently in normal sinus rhythm.  Most likely triggered with acute hypoxic respiratory failure.  He was treated with amiodarone.    Continue supportive care.  Not on anticoagulation.  Diastolic congestive heart failure: Echo showed normal left ventricular ejection fraction of 60 to 38%, grade 1 diastolic dysfunction.  He was given few doses of IV Lasix.  We will continue Lasix 40 mg daily.  Thrombocytopenia: Gradually improving without any signs of bleeding.  Suspected to be from cefepime/Pepcid or infectious etiology.  Normocytic anemia: Most likely associated with  critical illness and other comorbidities. we will continue to monitor H&H.  Transfuse if necessary  Hypokalemia:  Being monitored and supplemented  as needed. 09/18/2019: Potassium today is 4.  Hypernatremia: Mild.  Continue to monitor 08/29/2019: Encourage adequate oral intake.  Sodium today is 147. 08/30/2019: Repeat renal panel in the morning.  Severe protein calorie malnutrition: Has a history of chronic pancreatitis.  Dietitian following.  Currently on dysphagia 3 diet.  On Creon for chronic pancreatitis  Urine retention/phimosis: Urology following.  Placed Foley catheter.  Recommended to continue Foley catheter until he follows with urology as an outpatient.  Diabetes mellitus with hyperglycemia: Currently on sliding scale insulin and novolog.  Monitor blood sugars  Debility/deconditioning/disposition: Patient seen by PT and OT and recommended SNF on discharge.  Social worker consulted.  History of cigarette use/possibly undiagnosed COPD versus other chronic lung disease: -O2 sat is 88% on room air. -Patient is needing supplemental oxygen. -We will check arterial blood gas. -We will start patient on oral prednisone and nebulizer treatment. -Further management will depend on hospital course. -Patient may need to follow with pulmonary team on discharge for complete pulmonary function test.   Nutrition Problem: Severe Malnutrition Etiology: chronic illness (poorly controlled T2DM, pancreatitis)      DVT prophylaxis:SCD Code Status: Full Family Communication: Called and discussed with sister today on phone Status is: Inpatient   Dispo: The patient is from: Home              Anticipated d/c is to: Home with home health              Anticipated d/c date is: 1-2 days              Patient currently is medically stable for discharge to skilled nursing facility soon as bed is available.   Consultants: PCCM  Procedures:Intubation  Antimicrobials:  Anti-infectives (From admission, onward)   Start     Dose/Rate Route Frequency Ordered Stop   08/17/19 2245  cefTRIAXone (ROCEPHIN) 1 g in sodium chloride 0.9 % 100 mL IVPB          1 g 200 mL/hr over 30 Minutes Intravenous Every 12 hours 08/17/19 2149 08/20/19 1126   08/18/2019 2330  vancomycin (VANCOREADY) IVPB 500 mg/100 mL  Status:  Discontinued        500 mg 100 mL/hr over 60 Minutes Intravenous Every 12 hours 08/21/2019 1056 08/15/19 1131   08/02/2019 2300  ceFEPIme (MAXIPIME) 2 g in sodium chloride 0.9 % 100 mL IVPB  Status:  Discontinued        2 g 200 mL/hr over 30 Minutes Intravenous Every 12 hours 07/29/2019 1056 08/17/19 1833   08/05/2019 1030  vancomycin (VANCOREADY) IVPB 1250 mg/250 mL        1,250 mg 166.7 mL/hr over 90 Minutes Intravenous  Once 08/22/2019 1019 08/17/2019 1454   08/06/2019 0945  ceFEPIme (MAXIPIME) 2 g in sodium chloride 0.9 % 100 mL IVPB        2 g 200 mL/hr over 30 Minutes Intravenous  Once 08/12/2019 0933 08/13/2019 1148   08/05/2019 0945  vancomycin (VANCOCIN) IVPB 1000 mg/200 mL premix  Status:  Discontinued        1,000 mg 200 mL/hr over 60 Minutes Intravenous  Once 07/30/2019 0933 07/30/2019 1019      Subjective: Patient seen Reports feeling sleepy.  No shortness of breath reported No chest pain No fever or chills.   Patient only ate tiny part of his lunch.  Objective:  Vitals:   08/29/19 1246 08/29/19 1626 08/29/19 2112 08/30/19 0509  BP:  (!) 150/94 105/71 101/75  Pulse:  77 84 82  Resp:  18 15   Temp:   98.4 F (36.9 C) 97.7 F (36.5 C)  TempSrc:   Axillary Axillary  SpO2: 91% 99% 93%   Weight:   60.8 kg   Height:        Intake/Output Summary (Last 24 hours) at 08/30/2019 1302 Last data filed at 08/30/2019 0900 Gross per 24 hour  Intake 594 ml  Output 2275 ml  Net -1681 ml   Filed Weights   08/25/19 0450 08/26/19 0419 08/29/19 2112  Weight: 61.3 kg 62.2 kg 60.8 kg    Examination:   General exam: Not in any distress.  Patient is awake and alert. Patient is cachectic. HEENT: Pallor. No jaundice.  Respiratory system: Adequate air entry. Inspiratory and expiratory wheeze, ? end inspiratory wheeze, with added sounds.   Cardiovascular system: S1 & S2. Gastrointestinal system: Abdomen is nondistended, soft and nontender. No organomegaly or masses felt. Normal bowel sounds heard. Central nervous system: Alert and oriented. No focal neurological deficits. Extremities: No edema, no clubbing ,no cyanosis Skin: No rashes, lesions or ulcers,no icterus ,no pallor   Data Reviewed: I have personally reviewed following labs and imaging studies  CBC: Recent Labs  Lab 08/24/19 0225 08/25/19 0500 08/26/19 0409 08/27/19 0434  WBC 15.2* 13.5* 11.7* 8.8  NEUTROABS  --   --   --  6.7  HGB 9.5* 8.8* 8.5* 9.2*  HCT 27.3* 26.2* 25.6* 27.9*  MCV 91.6 93.2 94.5 95.9  PLT 87* 90* 88* 94*   Basic Metabolic Panel: Recent Labs  Lab 08/24/19 0225 08/24/19 0225 08/25/19 0500 08/25/19 0500 08/25/19 1611 08/26/19 0409 08/27/19 0434 08/28/19 0308 08/29/19 0734  NA 146*   < > 148*   < > 146* 147* 149* 142 147*  K 3.8   < > 2.7*   < > 3.4* 3.4* 3.3* 4.0 4.0  CL 111   < > 113*   < > 107 107 111 105 107  CO2 29   < > 28   < > _0 GLUCOSE 197*   < > 154*   < > 225* 89 141* 214* 149*  BUN 14   < > 15   < > _1 CREATININE 0.49*   < > 0.55*   < > 0.63 0.40* 0.59* 0.53* 0.50*  CALCIUM 7.6*   < > 7.6*   < > 7.7* 7.5* 7.8* 7.8* 8.0*  MG 1.8  --  1.7  --   --  2.0  --   --   --   PHOS  --   --  3.2  --   --  4.2  --   --   --    < > = values in this interval not displayed.   GFR: Estimated Creatinine Clearance: 81.2 mL/min (A) (by C-G formula based on SCr of 0.5 mg/dL (L)). Liver Function Tests: No results for input(s): AST, ALT, ALKPHOS, BILITOT, PROT, ALBUMIN in the last 168 hours. No results for input(s): LIPASE, AMYLASE in the last 168 hours. No results for input(s): AMMONIA in the last 168 hours. Coagulation Profile: No results for input(s): INR, PROTIME in the last 168 hours. Cardiac Enzymes: No results for input(s): CKTOTAL, CKMB, CKMBINDEX, TROPONINI in the last 168 hours. BNP (last 3  results) No results for input(s): PROBNP in  the last 8760 hours. HbA1C: No results for input(s): HGBA1C in the last 72 hours. CBG: Recent Labs  Lab 08/29/19 1115 08/29/19 1623 08/29/19 2117 08/30/19 0632 08/30/19 1126  GLUCAP 229* 174* 203* 316* 160*   Lipid Profile: No results for input(s): CHOL, HDL, LDLCALC, TRIG, CHOLHDL, LDLDIRECT in the last 72 hours. Thyroid Function Tests: No results for input(s): TSH, T4TOTAL, FREET4, T3FREE, THYROIDAB in the last 72 hours. Anemia Panel: No results for input(s): VITAMINB12, FOLATE, FERRITIN, TIBC, IRON, RETICCTPCT in the last 72 hours. Sepsis Labs: No results for input(s): PROCALCITON, LATICACIDVEN in the last 168 hours.  Recent Results (from the past 240 hour(s))  Culture, respiratory (non-expectorated)     Status: None   Collection Time: 08/21/19 11:34 AM   Specimen: Tracheal Aspirate; Respiratory  Result Value Ref Range Status   Specimen Description TRACHEAL ASPIRATE  Final   Special Requests ENDOTRACHEAL  Final   Gram Stain   Final    RARE WBC PRESENT,BOTH PMN AND MONONUCLEAR RARE BUDDING YEAST SEEN RARE GRAM POSITIVE COCCI IN PAIRS    Culture   Final    Consistent with normal respiratory flora. Performed at [facility], [address], [place], [place] [address]    Report Status 08/23/2019 FINAL  Final         Radiology Studies: No results found.      Scheduled Meds:  ARIPiprazole  1 mg Oral QHS   chlorhexidine gluconate (MEDLINE KIT)  15 mL Mouth Rinse BID   Chlorhexidine Gluconate Cloth  6 each Topical Q0600   feeding supplement (ENSURE ENLIVE)  237 mL Oral TID WC   free water  300 mL Oral Q8H   furosemide  40 mg Oral Daily   gabapentin  300 mg Oral BID   insulin aspart  0-5 Units Subcutaneous QHS   insulin aspart  0-9 Units Subcutaneous TID WC   insulin aspart  2 Units Subcutaneous TID WC   levETIRAcetam  500 mg Oral BID   lipase/protease/amylase  36,000 Units Oral TID AC    nicotine  21 mg Transdermal Daily   sodium chloride flush  10-40 mL Intracatheter Q12H   Continuous Infusions:  sodium chloride 10 mL/hr at 08/25/19 0640     LOS: 16 days    Time spent: 35 mins.   Bonnell Public, MD [facility] P8/07/2019, 1:02 PM

## 2019-08-30 NOTE — Consult Note (Signed)
Palliative Medicine Inpatient Consult Note  Reason for consult:  Goals of Care "End of life issues. Guarded prognosis"  HPI:  Per intake H&P -->  Pt is a 64 year old man with history of bipolar disease, schizophrenia, chronic pancreatitis, hep B, diabetes.  He was admitted with encephalopathy and pneumonia, required intubation, mechanical ventilation, pressors.  Course complicated by atrial fibrillation.  He was extubated 7/31. He has remained since then on 3L[facility] [place]. Plan for patient to transition to SNF when a bed is available.  Palliative care was asked to see patient to aid in conversations regarding goals in the setting of multiple chronic medical conditions.  Clinical Assessment/Goals of Care: I have reviewed medical records including EPIC notes, labs and imaging, received report from bedside RN, assessed the patient who was in bed on supplemental O2, he was oriented and able to converse this afternoon.   I met with Michael [name] at bedside and called his sister, Michael [name] to further discuss diagnosis prognosis, GOC, EOL wishes, disposition and options.   I introduced Palliative Medicine as specialized medical care for people living with serious illness. It focuses on providing relief from the symptoms and stress of a serious illness. The goal is to improve quality of life for both the patient and the family.  I asked Michael [name] to tell me about himself. He is from [name], [place]. He has lived in [name] his whole life. He has never been married and does not have any children. 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 He does consider himself a man of faith and practices within the [facility] denomination.   Prior to hospitalization Michael [name] was living with his sister. He was able to perform all bADLs on his own inclusive of organizing his daily medications.   His last hospitalization was in March when he left AMA.  In  terms of illicit substances, Michael [name] use to drink quite excessively up until about a year ago. Upon admission there were concerns of opoid misuse.   A detailed discussion was had today regarding advanced directives, he does not have any on file. He shares that if needed, he would rely on his sister, [name] to make decisions for him.    Concepts specific to code status, artifical feeding and hydration, continued IV antibiotics and rehospitalization was had.  I completed a MOST form today. The patient outlined his wishes for the following treatment decisions:  Cardiopulmonary Resuscitation: Do Not Attempt Resuscitation (DNR/No CPR)  Medical Interventions: Limited Additional Interventions: Use medical treatment, IV fluids and cardiac monitoring as indicated, DO NOT USE intubation or mechanical ventilation. May consider use of less invasive airway support such as BiPAP or CPAP. Also provide comfort measures. Transfer to the hospital if indicated. Avoid intensive care.   Antibiotics: Determine use of limitation of antibiotics when infection occurs  IV Fluids: IV fluids for a defined trial period  Feeding Tube: No feeding tube   Confirmed with Michael [name] that he would not wish for aggressive resuscitative efforts to be pursued if his heart stopped or his breathing became significantly impaired again. Michael [name] is in agreement with what Michael [name] is presently able to decide for himself.   The difference between a aggressive medical intervention path  and a palliative comfort care path for this patient at this time was had. I discussed that given these recent and recurrent hospital stays it may get to a point whereby it would be reasonable  to consider hospice enrollment. At the present time, Michael [name] and his sister, [name] are not ready for this. I offered OP palliative care to follow along and keep an extra set of eyes on Michael [name]'s progress or decline which they were in agreement with.  Michael [name] hopes to get stronger and  go back home to live with his sister, [name] at this point in time.  Discussed the importance of continued conversation with family and their  medical providers regarding overall plan of care and treatment options, ensuring decisions are within the context of the patients values and GOCs.  Provided "Hard Choices for [employer]" booklet.   Decision Maker: Patient making decisions for himself though if incapacitated he would rely on his sister, [name] to make decisions for him  SUMMARY OF RECOMMENDATIONS   DNAR/DNI  MOST Completed, paper copy placed onto the chart electric copy can be found in the [facility] section of epic  DNR Form Completed, paper copy placed onto the chart electric copy can be found in the [facility] section of epic  TOC --> OP Palliative Care  Spiritual Support  Code Status/Advance Care Planning: DNAR/DNI   Symptom Management:  Failure to Thrive Malnutrition:  - Dietician involved  - Encourage 1:1 feeding  - Supplemental nutrients   Muscular Deconditioning:  - PT  - OT  - OOB Daily   Palliative Prophylaxis:   Oral Care, Constipation, Mobility  Additional Recommendations (Limitations, Scope, Preferences):  Continue current scope of care   Psycho-social/Spiritual:   Desire for further Chaplaincy support: Yes - [employer]  Additional Recommendations: Education on illness progression. Information on Hospice    Prognosis: Poor in the setting of multiple co-morbidities and recurrent re-hospitalizations for PNA  Discharge Planning: Discharge to SNF with OP Palliative Care   PPS: 30%   This conversation/these recommendations were discussed with patient primary care team, Dr. Marthenia Rolling  Time In: 1310 Time Out: 1420 Total Time: 70 Greater than 50%  of this time was spent counseling and coordinating care related to the above assessment and plan.  [place] Team Team Cell Phone: [phone] Please utilize secure  chat with additional questions, if there is no response within 30 minutes please call the above phone number  Palliative Medicine Team providers are available by phone from 7am to 7pm daily and can be reached through the team cell phone.  Should this patient require assistance outside of these hours, please call the patient's attending physician.

## 2019-08-30 NOTE — Progress Notes (Signed)
Report called to [facility] [place]; pt belongings gathered, transported via bed to 3E18 at this time.

## 2019-08-30 NOTE — NC FL2 (Signed)
[place] LEVEL OF CARE SCREENING TOOL     IDENTIFICATION  Patient Name: Michael [name] Birthdate: 1955-02-06 Sex: male Admission Date (Current Location): 08/16/2019  [facility] and [place] Number:  Herbalist and Address:  [facility] [place] [address] [place], [place] [address]      Provider Number: [id number]  Attending Physician Name and Address:  Bonnell Public, MD  Relative Name and Phone Number:       Current Level of Care: Hospital Recommended Level of Care: [place] Prior Approval Number:    Date Approved/Denied:   PASRR Number: pending  Discharge Plan: SNF    Current Diagnoses: Patient Active Problem List   Diagnosis Date Noted  . Acute respiratory failure requiring reintubation ([place])   . AMS (altered mental status) 07/24/2019  . Shock circulatory ([place])   . Diabetic hypoglycemia ([place]) 04/09/2019  . CAP (community acquired pneumonia) 04/09/2019  . Diabetic ulcer of right foot ([place]) - dorsum of right foot 04/09/2019  . Hypothermia 03/10/2019  . Sepsis with acute organ dysfunction ([place]) 03/10/2019  . Seizure ([place]) 02/26/2019  . History of hepatitis B virus infection conferring immunity 03/22/2018  . Alcohol use disorder, severe, dependence ([place]) 12/29/2017  . Malnutrition of moderate degree 03/30/2017  . Hypoglycemia due to insulin 03/26/2017  . Acute metabolic encephalopathy 11/08/5100  . Other constipation 10/16/2016  . Recurrent falls 07/07/2016  . Wernicke's encephalopathy   . Bipolar disorder ([place]) 06/01/2016  . Dry skin 01/18/2016  . Peripheral vascular disease ([place]) 11/15/2015  . Hyperglycemia 09/02/2015  . Tobacco abuse 09/02/2015  . Tricompartment osteoarthritis of both knees 06/15/2015  . Vitamin D deficiency 03/12/2015  . Hyperlipidemia 02/17/2015  . Hypokalemia 12/05/2014  . Protein-calorie malnutrition, severe 11/18/2014  . Alcohol use   . Cervical arthritis 01/01/2014  .  Health care maintenance 08/25/2011  . Chronic alcoholic pancreatitis ([place]) 04/20/2011  . Type 2 diabetes mellitus with peripheral neuropathy ([place]) 12/09/2010  . HTN (hypertension) 12/09/2010    Orientation RESPIRATION BLADDER Height & Weight     Self, Place, Time  O2 (nasal canula) Incontinent, Indwelling catheter Weight: 134 lb 0.6 oz (60.8 kg) Height:  5' 4"  (162.6 cm)  BEHAVIORAL SYMPTOMS/MOOD NEUROLOGICAL BOWEL NUTRITION STATUS      Continent Diet (see discharge summary)  AMBULATORY STATUS COMMUNICATION OF NEEDS Skin   Extensive Assist Verbally Skin abrasions (abrasion on scrotum)                       Personal Care Assistance Level of Assistance  Bathing, Feeding, Dressing Bathing Assistance: Maximum assistance Feeding assistance: Independent Dressing Assistance: Maximum assistance     Functional Limitations Info  Sight, Hearing, Speech Sight Info: Adequate Hearing Info: Adequate Speech Info: Adequate    SPECIAL CARE FACTORS FREQUENCY  PT (By licensed PT), OT (By licensed OT)     PT Frequency: 5x week OT Frequency: 5x week            Contractures Contractures Info: Not present    Additional Factors Info  Code Status, Allergies, Psychotropic, Insulin Sliding Scale Code Status Info: Full Code Allergies Info: Ace Inhibitors, Losartan, Tylenol (Acetaminophen) Psychotropic Info: ARIPiprazole (ABILIFY) tablet 1 mg daily at bedtime Insulin Sliding Scale Info: insulin aspart (novoLOG) injection 0-5 Units daily at bedtime; insulin aspart (novoLOG) injection 0-9 Units 3x daily with meals       Current Medications (08/30/2019):  This is the current hospital active medication list  Current Facility-Administered Medications  Medication Dose Route Frequency Provider Last Rate Last Admin  . 0.9 %  sodium chloride infusion   Intra-arterial PRN Sherrilyn Rist A, MD 10 mL/hr at 08/25/19 0640 New Bag at 08/25/19 0640  . albuterol (PROVENTIL) (2.5 MG/3ML) 0.083% nebulizer  solution 2.5 mg  2.5 mg Nebulization Q3H PRN Anders Simmonds, MD   2.5 mg at 08/25/19 0449  . ARIPiprazole (ABILIFY) tablet 1 mg  1 mg Oral QHS Desai, Rahul P, PA-C   1 mg at 08/29/19 2153  . budesonide (PULMICORT) nebulizer solution 0.25 mg  0.25 mg Nebulization BID Dana Allan I, MD      . chlorhexidine gluconate (MEDLINE KIT) (PERIDEX) 0.12 % solution 15 mL  15 mL Mouth Rinse BID Collier Bullock, MD   15 mL at 08/30/19 0826  . Chlorhexidine Gluconate Cloth 2 % PADS 6 each  6 each Topical Q0600 Julian Hy, DO   6 each at 08/30/19 [phone]  . docusate sodium (COLACE) capsule 100 mg  100 mg Oral Daily PRN Collene Gobble, MD      . feeding supplement (ENSURE ENLIVE) (ENSURE ENLIVE) liquid 237 mL  237 mL Oral TID WC Collene Gobble, MD   237 mL at 08/30/19 1213  . free water 300 mL  300 mL Oral Q8H Adhikari, Amrit, MD   300 mL at 08/29/19 2154  . gabapentin (NEURONTIN) capsule 300 mg  300 mg Oral BID Collene Gobble, MD   300 mg at 08/30/19 0827  . insulin aspart (novoLOG) injection 0-5 Units  0-5 Units Subcutaneous QHS Shelly Coss, MD   2 Units at 08/29/19 2152  . insulin aspart (novoLOG) injection 0-9 Units  0-9 Units Subcutaneous TID WC Shelly Coss, MD   2 Units at 08/30/19 1212  . insulin aspart (novoLOG) injection 2 Units  2 Units Subcutaneous TID WC Shelly Coss, MD   2 Units at 08/30/19 1212  . ipratropium-albuterol (DUONEB) 0.5-2.5 (3) MG/3ML nebulizer solution 3 mL  3 mL Nebulization Q6H Dana Allan I, MD      . levETIRAcetam (KEPPRA) tablet 500 mg  500 mg Oral BID Collene Gobble, MD   500 mg at 08/30/19 0827  . lipase/protease/amylase (CREON) capsule 36,000 Units  36,000 Units Oral TID Charlotte Crumb, MD   36,000 Units at 08/30/19 1212  . nicotine (NICODERM CQ - dosed in mg/24 hours) patch 21 mg  21 mg Transdermal Daily Anders Simmonds, MD   21 mg at 08/30/19 0827  . oxyCODONE (Oxy IR/ROXICODONE) immediate release tablet 5 mg  5 mg Oral Q6H PRN Collene Gobble, MD    5 mg at 08/28/19 1552  . pantoprazole (PROTONIX) EC tablet 40 mg  40 mg Oral Daily Dana Allan I, MD      . polyethylene glycol (MIRALAX / GLYCOLAX) packet 17 g  17 g Oral Daily PRN Shearon Stalls, Rahul P, PA-C      . [START ON 08/31/2019] predniSONE (DELTASONE) tablet 40 mg  40 mg Oral Q breakfast Bonnell Public, MD      . Resource ThickenUp Clear   Oral PRN Collene Gobble, MD      . sodium chloride flush (NS) 0.9 % injection 10-40 mL  10-40 mL Intracatheter Q12H Frederik Pear, MD   10 mL at 08/29/19 2154  . sodium chloride flush (NS) 0.9 % injection 10-40 mL  10-40 mL Intracatheter PRN Frederik Pear, MD         Discharge  Medications: Please see discharge summary for a list of discharge medications.  Relevant Imaging Results:  Relevant Lab Results:   Additional Information SS#[id number] [place], [place]

## 2019-08-30 NOTE — Progress Notes (Signed)
[facility] ([facility])  Hospital Liaison: RN note     °    °Notified by TOC manager of patient/family request for [facility] Palliative services at SNF after discharge.        °     °[facility] Palliative team will follow up with patient after discharge.    °     °Please call with any hospice or palliative related questions.    °     °Thank you for this referral.    °     °Mary Anne Robertson, RN, CCM  °[facility] Hospital Liaison (listed on AMION under [facility]osp[facility])    °[phone]   °

## 2019-08-31 DIAGNOSIS — Z66 Do not resuscitate: Secondary | ICD-10-CM | POA: Diagnosis not present

## 2019-08-31 DIAGNOSIS — L8990 Pressure ulcer of unspecified site, unspecified stage: Secondary | ICD-10-CM | POA: Insufficient documentation

## 2019-08-31 DIAGNOSIS — E43 Unspecified severe protein-calorie malnutrition: Secondary | ICD-10-CM | POA: Diagnosis not present

## 2019-08-31 DIAGNOSIS — Z515 Encounter for palliative care: Secondary | ICD-10-CM | POA: Diagnosis not present

## 2019-08-31 DIAGNOSIS — Z7189 Other specified counseling: Secondary | ICD-10-CM | POA: Diagnosis not present

## 2019-08-31 LAB — GLUCOSE, CAPILLARY
Glucose-Capillary: 192 mg/dL — ABNORMAL HIGH (ref 70–99)
Glucose-Capillary: 247 mg/dL — ABNORMAL HIGH (ref 70–99)
Glucose-Capillary: 247 mg/dL — ABNORMAL HIGH (ref 70–99)
Glucose-Capillary: 248 mg/dL — ABNORMAL HIGH (ref 70–99)
Glucose-Capillary: 269 mg/dL — ABNORMAL HIGH (ref 70–99)
Glucose-Capillary: 291 mg/dL — ABNORMAL HIGH (ref 70–99)
Glucose-Capillary: 329 mg/dL — ABNORMAL HIGH (ref 70–99)
Glucose-Capillary: 367 mg/dL — ABNORMAL HIGH (ref 70–99)
Glucose-Capillary: 368 mg/dL — ABNORMAL HIGH (ref 70–99)
Glucose-Capillary: 416 mg/dL — ABNORMAL HIGH (ref 70–99)
Glucose-Capillary: 444 mg/dL — ABNORMAL HIGH (ref 70–99)
Glucose-Capillary: 445 mg/dL — ABNORMAL HIGH (ref 70–99)

## 2019-08-31 LAB — RENAL FUNCTION PANEL
Albumin: 1.3 g/dL — ABNORMAL LOW (ref 3.5–5.0)
Anion gap: 12 (ref 5–15)
BUN: 16 mg/dL (ref 8–23)
CO2: 29 mmol/L (ref 22–32)
Calcium: 7.7 mg/dL — ABNORMAL LOW (ref 8.9–10.3)
Chloride: 105 mmol/L (ref 98–111)
Creatinine, Ser: 0.73 mg/dL (ref 0.61–1.24)
GFR calc Af Amer: >60 mL/min (ref >60)
GFR calc non Af Amer: >60 mL/min (ref >60)
Glucose, Bld: 251 mg/dL — ABNORMAL HIGH (ref 70–99)
Phosphorus: 4.8 mg/dL — ABNORMAL HIGH (ref 2.5–4.6)
Potassium: 4.2 mmol/L (ref 3.5–5.1)
Sodium: 146 mmol/L — ABNORMAL HIGH (ref 135–145)

## 2019-08-31 LAB — CBC WITH DIFFERENTIAL/PLATELET
Abs Immature Granulocytes: 0.04 10*3/uL (ref 0.00–0.07)
Basophils Absolute: 0 10*3/uL (ref 0.0–0.1)
Basophils Relative: 0 %
Eosinophils Absolute: 0 10*3/uL (ref 0.0–0.5)
Eosinophils Relative: 0 %
HCT: 32.1 % — ABNORMAL LOW (ref 39.0–52.0)
Hemoglobin: 10.5 g/dL — ABNORMAL LOW (ref 13.0–17.0)
Immature Granulocytes: 0 %
Lymphocytes Relative: 27 %
Lymphs Abs: 2.6 10*3/uL (ref 0.7–4.0)
MCH: 31.9 pg (ref 26.0–34.0)
MCHC: 32.7 g/dL (ref 30.0–36.0)
MCV: 97.6 fL (ref 80.0–100.0)
Monocytes Absolute: 0.9 10*3/uL (ref 0.1–1.0)
Monocytes Relative: 10 %
Neutro Abs: 6 10*3/uL (ref 1.7–7.7)
Neutrophils Relative %: 63 %
Platelets: 63 10*3/uL — ABNORMAL LOW (ref 150–400)
RBC: 3.29 MIL/uL — ABNORMAL LOW (ref 4.22–5.81)
RDW: 20.1 % — ABNORMAL HIGH (ref 11.5–15.5)
WBC: 9.6 10*3/uL (ref 4.0–10.5)
nRBC: 0.3 % — ABNORMAL HIGH (ref 0.0–0.2)

## 2019-08-31 LAB — MAGNESIUM: Magnesium: 1.5 mg/dL — ABNORMAL LOW (ref 1.7–2.4)

## 2019-08-31 MED ORDER — INSULIN ASPART 100 UNIT/ML ~~LOC~~ SOLN
0.0000 [IU] | SUBCUTANEOUS | Status: DC
  Administered 2019-08-31: 5 [IU] via SUBCUTANEOUS
  Administered 2019-08-31: 3 [IU] via SUBCUTANEOUS
  Administered 2019-08-31: 6 [IU] via SUBCUTANEOUS
  Administered 2019-08-31: 1 [IU] via SUBCUTANEOUS
  Administered 2019-08-31: 4 [IU] via SUBCUTANEOUS
  Administered 2019-09-01: 2 [IU] via SUBCUTANEOUS

## 2019-08-31 MED ORDER — GLUCERNA SHAKE PO LIQD
237.0000 mL | Freq: Three times a day (TID) | ORAL | Status: DC
  Administered 2019-08-31 – 2019-09-01 (×4): 237 mL via ORAL

## 2019-08-31 MED ORDER — INSULIN GLARGINE 100 UNIT/ML ~~LOC~~ SOLN
20.0000 [IU] | Freq: Once | SUBCUTANEOUS | Status: AC
  Administered 2019-08-31: 20 [IU] via SUBCUTANEOUS
  Filled 2019-08-31: qty 0.2

## 2019-08-31 NOTE — Progress Notes (Signed)
   Palliative Medicine Inpatient Follow Up Note   Reason for consult:  Goals of Care "End of life issues. Guarded prognosis"  HPI:  Per intake H&P -->  Pt is a 64 year old man with history of bipolar disease, schizophrenia, chronic pancreatitis, hep B, diabetes. He was admitted with encephalopathy and pneumonia, required intubation, mechanical ventilation, pressors. Course complicated by atrial fibrillation. He was extubated 7/31. He has remained since then on 3LPM [place]. Plan for patient to transition to SNF when a bed is available.  Palliative care was asked to see patient to aid in conversations regarding goals in the setting of multiple chronic medical conditions.  Today's Discussion (08/31/2019): Chart reviewed. Patient more alert this morning. He was noted to be in good spirits and was in the process of ordering his breakfast.  Talked to patients bedside RN, Vinnie Level. She shares that the patient has had some elevated blood glucose levels in the setting of drinking ensure, we changed this to glucerna. Dietary is involved with this patient as well.  Plan remains for SNF placement with OP palliative care.  Discussed the importance of continued conversation with family and their  medical providers regarding overall plan of care and treatment options, ensuring decisions are within the context of the patients values and GOCs.  Questions and concerns addressed    SUMMARY OF RECOMMENDATIONS DNAR/DNI  MOST Completed, paper copy placed onto the chart electric copy can be found in the [place]yn[place] section of epic  DNR Form Completed, paper copy placed onto the chart electric copy can be found in the Vynca section of epic   OP Palliative Care - [facility] consulted  Spiritual Support  Code Status/Advance Care Planning: DNAR/DNI  Symptom Management: Failure to Thrive Malnutrition:                 - Dietician involved                 - Encourage 1:1 feeding                 -  Supplemental nutrients - Changed to Glucerna given BG levels                  Muscular Deconditioning:                 - PT                 - OT                 - OOB Daily  Time Spent: 25 Greater than 50% of the time was spent in counseling and coordination of care ______________________________________________________________________________________ [place] Team Team Cell Phone: [phone] Please utilize secure chat with additional questions, if there is no response within 30 minutes please call the above phone number  Palliative Medicine Team providers are available by phone from 7am to 7pm daily and can be reached through the team cell phone.  Should this patient require assistance outside of these hours, please call the patient's attending physician.

## 2019-08-31 NOTE — Progress Notes (Signed)
Patients blood sugar noted to be 444 via finger stick. 6 units given per protocal (see Mar) Rechecked at 1615, 445. MD was notified via page system. Awaiitng response. Pt denies any headache, or nausea or any other sx at this time. He is agitated and states "[place], just let me go home".

## 2019-08-31 NOTE — Progress Notes (Signed)
PROGRESS NOTE    Michael [name]  TDD:[id number] DOB: [birth year] DOA: 08/13/2019 [facility]CP: [facility]   Brief Narrative:  Patient is a 64 year old male with history of bipolar disease, schizophrenia, chronic pancreatitis, hepatitis B, diabetes type 2 admitted with altered mental status, undocumented shortness of breath.  Blood sugar on presentation was greater than 600.  Work-up revealed acute hypoxemic respiratory failure, bilateral pneumonia likely secondary to aspiration, septic shock, diabetes mellitus with ketoacidosis, severe hypokalemia, severe volume depletion, encephalopathy and possible seizure.  He was intubated for acute hypoxic respiratory failure and was put on vent.  Hospital course remarkable for atrial fibrillation, urinary retention due to phimosis, dysphagia.  He was extubated on 7/31.  Patient transferred to [facility] service on 08/26/2019.  Urology was following. Hospital course remarkable for continued requirement of oxygen supplementation for maintenance of saturation.  PT/OT evaluated him and recommended skilled nursing facility on discharge.  Planning for skilled nursing facility placement.  08/29/2019: Patient seen.  No new changes.  Patient is improving slowly.  Available lab records reviewed.  Albumin was 1.4 on 08/19/2019.  Patient has likely sacral pressure ulcer that is currently covered.  The staging of the sacral ulcer is not known.  Healing will be challenging in the face of very low albumin.  Patient needs adequate nutrition.    08/30/2019: Patient seen today.  Patient reports feeling sleepy.  Patient did not finish his lunch.  Will start caloric count.  Will consult dietary team.  Patient's prognosis is guarded.  Will consult palliative care team.  08/31/2019: Patient seen.  Blood sugar significantly elevated.  Will discontinue prednisone.  Will manage elevated blood sugar.  Palliative care input is appreciated.  Patient is awaiting disposition.  Assessment & Plan:     Active Problems:   AMS (altered mental status)   Shock circulatory ([place])   Acute respiratory failure requiring reintubation ([place])   Palliative care by specialist   Goals of care, counseling/discussion   DNR (do not resuscitate)   FTT (failure to thrive) in adult   Severe protein-calorie malnutrition ([place])   Muscular deconditioning   Pressure injury of skin   Acute hypoxic respiratory failure: Suspected to be from aspiration pneumonia.  He completed antibiotics.  Had to be intubated on presentation.  Extubated on 7/31.  He still on oxygen at 3-5 L vial cannula.  We will continue to monitor respiratory status.  Intermittent chest x-ray will be done. He was also given few doses of iv  Lasix for congestion.   We will continue oral Lasix on the scheduled doses. Last cX R showed improved aeration in the bilateral lungs with persistent upper lobe predominant infiltrates.  Continue supplemental oxygen as needed.  But continue to attempt to wean the oxygen too.  Looks like he qualified for home oxygen. 08/30/2019: Continue to wean down supplemental oxygen.  Keep O2 sat greater than 91%.  Patient is currently on supplemental oxygen 3 L/min via nasal cannula 08/31/2019: Discontinue prednisone.  Continue DuoNeb and Pulmicort.  Titrate supplemental oxygen as needed.  Acute metabolic encephalopathy: Secondary to hypoxia.  Mental status has been improving .  Today he was alert and oriented.  He has history of seizures , bipolar disorder, schizophrenia, neuropathy.  On Keppra, Abilify, gabapentin.  Avoid sedating medications. 08/29/2019: Patient's baseline mental state is nonnontreatment.  Continue to assess.  Collateral information will be most useful. 08/31/2019: Seems stable.  Able to converse.  A. fib with RVR: Transient and resolved.Currently in normal sinus rhythm.  Most likely triggered with acute hypoxic respiratory failure.  He was treated with amiodarone.    Continue supportive care.  Not on  anticoagulation.  Diastolic congestive heart failure: Echo showed normal left ventricular ejection fraction of 60 to 36%, grade 1 diastolic dysfunction.  He was given few doses of IV Lasix.  We will continue Lasix 40 mg daily.  Thrombocytopenia: Gradually improving without any signs of bleeding.  Suspected to be from cefepime/Pepcid or infectious etiology.  Normocytic anemia: Most likely associated with critical illness and other comorbidities. we will continue to monitor H&H.  Transfuse if necessary  Hypokalemia:  Being monitored and supplemented as needed. 08/29/2019: Potassium today is 4.  Hypernatremia:  Mild.  Continue to monitor 08/29/2019: Encourage adequate oral intake.  Sodium today is 147. 08/30/2019: Repeat renal panel in the morning.  08/31/2019: Sodium is 146.  Encourage free water.  Severe protein calorie malnutrition: Has a history of chronic pancreatitis.  Dietitian following.  Currently on dysphagia 3 diet.  On Creon for chronic pancreatitis  Urine retention/phimosis: Urology following.  Placed Foley catheter.  Recommended to continue Foley catheter until he follows with urology as an outpatient.  Diabetes mellitus with hyperglycemia:  Currently on sliding scale insulin and novolog.   Blood sugars currently elevated due to steroids.   DC steroids.   Control elevated blood sugar.    Debility/deconditioning/disposition: Patient seen by PT and OT and recommended SNF on discharge.  Social worker consulted.  History of cigarette use/possibly undiagnosed COPD versus other chronic lung disease: -O2 sat is 88% on room air. -Patient is needing supplemental oxygen. -DC prednisone due to significantly elevated blood sugar. -Continue nebs DuoNeb and Pulmicort. -Further management will depend on hospital course. -Patient may need to follow with pulmonary team on discharge for complete pulmonary function test.   Nutrition Problem: Severe Malnutrition Etiology: chronic illness  (poorly controlled T2DM, pancreatitis)      DVT prophylaxis:SCD Code Status: Full Family Communication: Called and discussed with sister today on phone Status is: Inpatient   Dispo: The patient is from: Home              Anticipated d/c is to: Home with home health              Anticipated d/c date is: 1-2 days              Patient currently is medically stable for discharge to skilled nursing facility soon as bed is available.   Consultants: PCCM  Procedures:Intubation  Antimicrobials:  Anti-infectives (From admission, onward)   Start     Dose/Rate Route Frequency Ordered Stop   08/17/19 2245  cefTRIAXone (ROCEPHIN) 1 g in sodium chloride 0.9 % 100 mL IVPB        1 g 200 mL/hr over 30 Minutes Intravenous Every 12 hours 08/17/19 2149 08/20/19 1126   08/06/2019 2330  vancomycin (VANCOREADY) IVPB 500 mg/100 mL  Status:  Discontinued        500 mg 100 mL/hr over 60 Minutes Intravenous Every 12 hours 08/09/2019 1056 08/15/19 1131   08/13/2019 2300  ceFEPIme (MAXIPIME) 2 g in sodium chloride 0.9 % 100 mL IVPB  Status:  Discontinued        2 g 200 mL/hr over 30 Minutes Intravenous Every 12 hours 08/13/2019 1056 08/17/19 1833   08/09/2019 1030  vancomycin (VANCOREADY) IVPB 1250 mg/250 mL        1,250 mg 166.7 mL/hr over 90 Minutes Intravenous  Once 07/26/2019 1019 08/08/2019 1454  08/15/2019 0945  ceFEPIme (MAXIPIME) 2 g in sodium chloride 0.9 % 100 mL IVPB        2 g 200 mL/hr over 30 Minutes Intravenous  Once 07/31/2019 0933 08/07/2019 1148   07/30/2019 0945  vancomycin (VANCOCIN) IVPB 1000 mg/200 mL premix  Status:  Discontinued        1,000 mg 200 mL/hr over 60 Minutes Intravenous  Once 08/20/2019 0933 08/03/2019 1019      Subjective: Patient seen Reports feeling sleepy.  No shortness of breath reported No chest pain No fever or chills.   Patient only ate tiny part of his lunch.  Objective: Vitals:   08/31/19 0800 08/31/19 0814 08/31/19 0901 08/31/19 1158  BP: 93/68   (!) 89/63  Pulse:  80   72  Resp: 18   18  Temp: 97.6 F (36.4 C)   98 F (36.7 C)  TempSrc: Oral   Skin  SpO2:  98% 98% 100%  Weight:      Height:        Intake/Output Summary (Last 24 hours) at 08/31/2019 1317 Last data filed at 08/31/2019 0604 Gross per 24 hour  Intake 937.62 ml  Output 625 ml  Net 312.62 ml   Filed Weights   08/26/19 0419 08/29/19 2112 08/31/19 0011  Weight: 62.2 kg 60.8 kg 59.8 kg    Examination:   General exam: Not in any distress.  Patient is awake and alert. Patient is cachectic. HEENT: Pallor. No jaundice.  Respiratory system: Adequate air entry. Cardiovascular system: S1 & S2. Gastrointestinal system: Abdomen is nondistended, soft and nontender. No organomegaly or masses felt. Normal bowel sounds heard. Central nervous system: Alert and oriented. No focal neurological deficits. Extremities: No edema, no clubbing ,no cyanosis Skin: No rashes, lesions or ulcers,no icterus ,no pallor   Data Reviewed: I have personally reviewed following labs and imaging studies  CBC: Recent Labs  Lab 08/25/19 0500 08/26/19 0409 08/27/19 0434 08/31/19 0937  WBC 13.5* 11.7* 8.8 9.6  NEUTROABS  --   --  6.7 6.0  HGB 8.8* 8.5* 9.2* 10.5*  HCT 26.2* 25.6* 27.9* 32.1*  MCV 93.2 94.5 95.9 97.6  PLT 90* 88* 94* 63*   Basic Metabolic Panel: Recent Labs  Lab 08/25/19 0500 08/25/19 1611 08/26/19 0409 08/27/19 0434 08/28/19 0308 08/29/19 0734 08/31/19 0937  NA 148*   < > 147* 149* 142 147* 146*  K 2.7*   < > 3.4* 3.3* 4.0 4.0 4.2  CL 113*   < > 107 111 105 107 105  CO2 28   < > _0 GLUCOSE 154*   < > 89 141* 214* 149* 251*  BUN 15   < > _1 CREATININE 0.55*   < > 0.40* 0.59* 0.53* 0.50* 0.73  CALCIUM 7.6*   < > 7.5* 7.8* 7.8* 8.0* 7.7*  MG 1.7  --  2.0  --   --   --  1.5*  PHOS 3.2  --  4.2  --   --   --  4.8*   < > = values in this interval not displayed.   GFR: Estimated Creatinine Clearance: 81.2 mL/min (by C-G formula based on SCr of 0.73  mg/dL). Liver Function Tests: Recent Labs  Lab 08/31/19 0937  ALBUMIN 1.3*   No results for input(s): LIPASE, AMYLASE in the last 168 hours. No results for input(s): AMMONIA in the last 168 hours. Coagulation Profile: No results for input(s): INR, PROTIME  in the last 168 hours. Cardiac Enzymes: No results for input(s): CKTOTAL, CKMB, CKMBINDEX, TROPONINI in the last 168 hours. BNP (last 3 results) No results for input(s): PROBNP in the last 8760 hours. HbA1C: No results for input(s): HGBA1C in the last 72 hours. CBG: Recent Labs  Lab 08/31/19 0017 08/31/19 0201 08/31/19 0424 08/31/19 0551 08/31/19 0737  GLUCAP 248* 291* 269* 247* 192*   Lipid Profile: No results for input(s): CHOL, HDL, LDLCALC, TRIG, CHOLHDL, LDLDIRECT in the last 72 hours. Thyroid Function Tests: No results for input(s): TSH, T4TOTAL, FREET4, T3FREE, THYROIDAB in the last 72 hours. Anemia Panel: No results for input(s): VITAMINB12, FOLATE, FERRITIN, TIBC, IRON, RETICCTPCT in the last 72 hours. Sepsis Labs: No results for input(s): PROCALCITON, LATICACIDVEN in the last 168 hours.  No results found for this or any previous visit (from the past 240 hour(s)).       Radiology Studies: No results found.      Scheduled Meds: . ARIPiprazole  1 mg Oral QHS  . budesonide (PULMICORT) nebulizer solution  0.25 mg Nebulization BID  . chlorhexidine gluconate (MEDLINE KIT)  15 mL Mouth Rinse BID  . Chlorhexidine Gluconate Cloth  6 each Topical Q0600  . feeding supplement (GLUCERNA SHAKE)  237 mL Oral TID BM  . free water  300 mL Oral Q8H  . gabapentin  300 mg Oral BID  . insulin aspart  0-6 Units Subcutaneous Q4H  . ipratropium-albuterol  3 mL Nebulization BID  . levETIRAcetam  500 mg Oral BID  . lipase/protease/amylase  36,000 Units Oral TID AC  . nicotine  21 mg Transdermal Daily  . pantoprazole  40 mg Oral Daily  . predniSONE  40 mg Oral Q breakfast  . sodium chloride flush  10-40 mL Intracatheter  Q12H   Continuous Infusions: . sodium chloride 10 mL/hr at 08/25/19 0640  . sodium chloride 75 mL/hr at 08/31/19 0434     LOS: 17 days    Time spent: 35 mins.   Bonnell Public, MD [facility] P8/08/2019, 1:17 PM

## 2019-09-01 DIAGNOSIS — J9600 Acute respiratory failure, unspecified whether with hypoxia or hypercapnia: Secondary | ICD-10-CM | POA: Diagnosis not present

## 2019-09-01 LAB — BASIC METABOLIC PANEL
Anion gap: 11 (ref 5–15)
BUN: 15 mg/dL (ref 8–23)
CO2: 27 mmol/L (ref 22–32)
Calcium: 7.8 mg/dL — ABNORMAL LOW (ref 8.9–10.3)
Chloride: 111 mmol/L (ref 98–111)
Creatinine, Ser: 0.53 mg/dL — ABNORMAL LOW (ref 0.61–1.24)
GFR calc Af Amer: >60 mL/min (ref >60)
GFR calc non Af Amer: >60 mL/min (ref >60)
Glucose, Bld: 42 mg/dL — CL (ref 70–99)
Potassium: 3.3 mmol/L — ABNORMAL LOW (ref 3.5–5.1)
Sodium: 149 mmol/L — ABNORMAL HIGH (ref 135–145)

## 2019-09-01 LAB — GLUCOSE, CAPILLARY
Glucose-Capillary: 109 mg/dL — ABNORMAL HIGH (ref 70–99)
Glucose-Capillary: 209 mg/dL — ABNORMAL HIGH (ref 70–99)
Glucose-Capillary: 28 mg/dL — CL (ref 70–99)
Glucose-Capillary: 38 mg/dL — CL (ref 70–99)
Glucose-Capillary: 53 mg/dL — ABNORMAL LOW (ref 70–99)
Glucose-Capillary: 99 mg/dL (ref 70–99)
Glucose-Capillary: 108 mg/dL — ABNORMAL HIGH (ref 70–99)
Glucose-Capillary: 78 mg/dL (ref 70–99)
Glucose-Capillary: 105 mg/dL — ABNORMAL HIGH (ref 70–99)
Glucose-Capillary: 107 mg/dL — ABNORMAL HIGH (ref 70–99)
Glucose-Capillary: 140 mg/dL — ABNORMAL HIGH (ref 70–99)

## 2019-09-01 LAB — PHOSPHORUS: Phosphorus: 3.8 mg/dL (ref 2.5–4.6)

## 2019-09-01 LAB — MAGNESIUM: Magnesium: 1.6 mg/dL — ABNORMAL LOW (ref 1.7–2.4)

## 2019-09-01 MED ORDER — POTASSIUM CHLORIDE CRYS ER 20 MEQ PO TBCR
40.0000 meq | EXTENDED_RELEASE_TABLET | Freq: Two times a day (BID) | ORAL | Status: DC
  Administered 2019-09-01: 40 meq via ORAL
  Filled 2019-09-01: qty 2

## 2019-09-01 MED ORDER — DEXTROSE 10 % IV SOLN: INTRAVENOUS | Status: DC

## 2019-09-01 MED ORDER — ADULT MULTIVITAMIN W/MINERALS CH
1.0000 | ORAL_TABLET | Freq: Every day | ORAL | Status: DC
  Administered 2019-09-01: 1 via ORAL
  Filled 2019-09-01: qty 1

## 2019-09-01 MED ORDER — NEPRO/CARBSTEADY PO LIQD
237.0000 mL | Freq: Two times a day (BID) | ORAL | Status: DC
  Administered 2019-09-01: 237 mL via ORAL

## 2019-09-01 MED ORDER — MAGNESIUM SULFATE 4 GM/100ML IV SOLN
4.0000 g | Freq: Once | INTRAVENOUS | Status: AC
  Administered 2019-09-01: 4 g via INTRAVENOUS
  Filled 2019-09-01 (×2): qty 100

## 2019-09-01 MED ORDER — DEXTROSE 50 % IV SOLN
INTRAVENOUS | Status: AC
  Administered 2019-09-01 (×3): 25 mL
  Filled 2019-09-01: qty 50

## 2019-09-01 MED ORDER — DEXTROSE 50 % IV SOLN
INTRAVENOUS | Status: AC
  Administered 2019-09-01: 25 mL
  Filled 2019-09-01: qty 50

## 2019-09-01 NOTE — Progress Notes (Signed)
   09/01/19 1235  Clinical Encounter Type  Visited With Patient;Family  Visit Type Initial;Spiritual support  Referral From Palliative care team  Consult/Referral To Chaplain  Stress Factors  Patient Stress Factors Other (Comment) (Transition to SNF)  This chaplain responded to PMT consult for spiritual care.  The Pt. immediate request was assistance in phoning the Pt. sister-[name] [phone]. The chaplain facilitated the phone call with the Pt. Sister. The chaplain talked to  [place]. [name] requested the [place] phone her for discharge clarification. The chaplain updated the RN. The Pt. chose not to continue conversation with the chaplain. F/U spiritual care is available as needed.

## 2019-09-01 NOTE — Progress Notes (Signed)
Pt's CBG 28, gave 75ml of D50 went back up to 99, MD notified, will continue to monitor, Thanks Arvella Nigh RN.

## 2019-09-01 NOTE — Progress Notes (Addendum)
Inpatient Diabetes Program Recommendations  AACE/ADA: New Consensus Statement on Inpatient Glycemic Control (2015)  Target Ranges:  Prepandial:   less than 140 mg/dL      Peak postprandial:   less than 180 mg/dL (1-2 hours)      Critically ill patients:  140 - 180 mg/dL   Lab Results  Component Value Date   GLUCAP 108 (H) 09/01/2019   HGBA1C 15.3 (H) 08/15/2019    Review of Glycemic Control  Diabetes history: DM 2 Outpatient Diabetes medications: Levemir 16 units bid, Novolog 10 units tid with meals Current orders for Inpatient glycemic control:  Lantus 20 units Novolog 0-6 units Q4 hours  Glucerna tid between meals  Inpatient Diabetes Program Recommendations:    Noted prednisone stopped and at the same time Lantus 20 units given last night. Pt with severe hypoglycemia this am. Consider d/cing Lantus until glucose trends are consistently >180.  Pt may require dextrose for the length of time Lantus insulin is on board, noted dextrose ordered.  Addendum 1211 pm: Tried to speak with pt in room. Pt more interested in calling his sister. He told me I could speak with her. Unclear on discharge plan as pt does not want SNF and told me "I am not going to a skilled nursing rehab." Pt reports sister would help him at home.   2:22 pm: Spoke with sister over the phone. Pt will go to rehab for a couple of weeks and then come home. She will help with medication if needed. Spoke to her about pt A1c level and glucose goals. Discussed how often it was intended for him to be taking his home regimen of insulin. Discussed glucose goals. Pt sister has diabetes and takes pills. Sister is familiar with how to inject insulin. No questions at this time.  Thanks,  Tama Headings RN, MSN, BC-ADM Inpatient Diabetes Coordinator Team Pager [phone] (8a-5p)

## 2019-09-01 NOTE — Progress Notes (Addendum)
Nutrition Follow-up  DOCUMENTATION CODES:   Severe malnutrition in context of chronic illness  INTERVENTION:   -D/c calorie count -MVI with minerals daily -D/c Glucerna Shake po TID, each supplement provides 220 kcal and 10 grams of protein -Nepro Shake po BID, each supplement provides 425 kcal and 19 grams protein -Magic cup TID with meals, each supplement provides 290 kcal and 9 grams of protein  NUTRITION DIAGNOSIS:   Severe Malnutrition related to chronic illness (poorly controlled T2DM, pancreatitis) as evidenced by moderate fat depletion, moderate muscle depletion, severe muscle depletion, percent weight loss (10.4% weight loss in 4 months).  Ongoing  GOAL:   Patient will meet greater than or equal to 90% of their needs  Progressing   MONITOR:   PO intake, Supplement acceptance  REASON FOR ASSESSMENT:   Consult Assessment of nutrition requirement/status  ASSESSMENT:   64 year old male who presented to the ED on 7/22 with respiratory distress after being found unresponsive on the floor by family. Pt had multiple seizures in the field. PMH of bipolar disorder, T2DM (poorly compliant with insulin regimen), GERD, pancreatitis, schizophrenia, seizures. Pt required intubation for airway protection.  7/23 - trickle TF initiated, extubated 7/25 - intubated 8/1 - self-extubated  Reviewed I/O's: -576 ml x 24 hours and +1.7 L since 08/18/19  UOP: 750 ml x 24 hours  Spoke with pt at bedside, who reports he is feeling better today. He reports he is eating "most" of his food. Noted meal completions documented at 25-100%. Pt denies any chewing or swallowing difficulty and endorses tolerating current diet well. He is consuming Glucerna supplements (noted that these were changed from Ensure over the weekend due to concerns of high CBGS. RD will adjust supplement to Nepro, high this is more nutrient dense and nectar thick at baseline.   Noted calorie count was ordered due to  hypoglycemia. No meal tickets or envelope available to review at this time.   Medications reviewed and include Creon.   Labs reviewed: Na: 149, K: 3.3, Mg: 1.6 (on IV supplementation), CBGS: 38-99 (inpatient orders for glycemic control are 0-6 units insulin aspart every 4 hours).   Diet Order:   Diet Order            DIET DYS 3 Room service appropriate? Yes with Assist; Fluid consistency: Nectar Thick  Diet effective now                 EDUCATION NEEDS:   No education needs have been identified at this time  Skin:  Skin Assessment: Skin Integrity Issues: Skin Integrity Issues:: Other (Comment), Stage II Stage II: sacrum Other: skin tear to bilateral arms  Last BM:  08/31/19  Height:   Ht Readings from Last 1 Encounters:  08/31/19 5\' 4"  (1.626 m)    Weight:   Wt Readings from Last 1 Encounters:  09/01/19 59.7 kg    Ideal Body Weight:  59.1 kg  BMI:  Body mass index is 22.59 kg/m.  Estimated Nutritional Needs:   Kcal:  1800-2000  Protein:  105-120 grams  Fluid:  > 1.8 L    Loistine Chance, RD, LDN, [place] Registered Dietitian II Certified Diabetes Care and Education Specialist Please refer to [facility] for RD and/or RD on-call/weekend/after hours pager

## 2019-09-01 NOTE — Plan of Care (Signed)
  Problem: Education: Goal: Knowledge of General Education information will improve Description Including pain rating scale, medication(s)/side effects and non-pharmacologic comfort measures Outcome: Progressing   

## 2019-09-01 NOTE — Progress Notes (Signed)
CBG 38, gave another dose of 25 ml D50, rechecked 53, gave another 25 ml of D50 and OJ went back to 108, MD notified, will continue to monitor, Thanks Arvella Nigh RN.

## 2019-09-01 NOTE — Progress Notes (Signed)
Current Palliative:  °[facility] ([facility]) Community Based Palliative Care   °    ° This patient has been referred for our palliative care services in the community.  [facility] will continue to follow for any discharge planning needs and to coordinate initiation of palliative care.   °I °f you have questions or need assistance, please call [phone] or contact the hospital Liaison listed on AMION.    °    °Chrislyn King, BSN, RN °[facility] Hospital Liaison   °[phone] (24h on call) ° ° °

## 2019-09-01 NOTE — Progress Notes (Signed)
Physical Therapy Treatment Patient Details Name: Michael [name] MRN: [id number] DOB: [birth year] Today's Date: 09/01/2019    History of Present Illness Pt is a 64 year old man with history of bipolar disease, schizophrenia, chronic pancreatitis, hep B, diabetes.  He was admitted with encephalopathy and pneumonia, required intubation, mechanical ventilation, pressors.  Course complicated by atrial fibrillation.  He was extubated 7/31    PT Comments    Pt admitted with above diagnosis. On arrival to room, pt was lying in bed sideways and without a gown on.  NT entered at same time.  NT and this PT assisted pt to sitting, cleaned him as he has BM in bed and made pt comfortable once in bed.  Will continue to follow acutely. Pt frequency updated as per standards of care, SNF are 2x week.   Pt currently with functional limitations due to balance and endurance deficits. Pt will benefit from skilled PT to increase their independence and safety with mobility to allow discharge to the venue listed below.     Follow Up Recommendations  SNF     Equipment Recommendations  None recommended by PT    Recommendations for Other Services       Precautions / Restrictions Precautions Precautions: Fall Restrictions Weight Bearing Restrictions: No    Mobility  Bed Mobility Overal bed mobility: Needs Assistance Bed Mobility: Supine to Sit;Sit to Supine     Supine to sit: Mod assist Sit to supine: Min guard   General bed mobility comments: Pt needed assist to come to EOB pulling up on PT.  Did not need assist to lie back down.   Transfers Overall transfer level: Needs assistance   Transfers: Sit to/from Stand;Stand Pivot Transfers Sit to Stand: Min assist;Mod assist;+2 physical assistance;From elevated surface         General transfer comment: Pt stood so that he could take steps to [facility]. Pt needed +2 mod assist to stand to his feet. Noted BM on pad therefore cleaned pt and then he took about 2  steps to [facility] and sat abruptly.    Ambulation/Gait                 Stairs             Wheelchair Mobility    Modified Rankin (Stroke Patients Only)       Balance Overall balance assessment: Needs assistance Sitting-balance support: Single extremity supported;Bilateral upper extremity supported;No upper extremity supported Sitting balance-Leahy Scale: Fair Sitting balance - Comments: Pt sat EOB fro up to 10 min.    Standing balance support: During functional activity;Bilateral upper extremity supported Standing balance-Leahy Scale: Poor Standing balance comment: posterior lean and needs constant assist and support as well as bil UE support                            Cognition Arousal/Alertness: Awake/alert Behavior During Therapy: WFL for tasks assessed/performed Overall Cognitive Status: Impaired/Different from baseline Area of Impairment: Memory;Following commands;Safety/judgement;Awareness;Problem solving                     Memory: Decreased short-term memory Following Commands: Follows one step commands inconsistently Safety/Judgement: Decreased awareness of safety;Decreased awareness of deficits Awareness: Intellectual Problem Solving: Slow processing;Decreased initiation;Difficulty sequencing;Requires verbal cues;Requires tactile cues        Exercises General Exercises - Lower Extremity Ankle Circles/Pumps: AROM;Both;20 reps;Supine Long Arc Quad: AROM;Both;5 reps;Seated    General Comments General comments (skin integrity,  edema, etc.): Sats on RA 87%.  Sats on 2L 89%.  Sats on 3L were 90% and >.       Pertinent Vitals/Pain Faces Pain Scale: No hurt    Home Living                      Prior Function            PT Goals (current goals can now be found in the care plan section) Acute Rehab PT Goals Patient Stated Goal: pt unable at this time Progress towards PT goals: Not progressing toward goals - comment (still  with slow progress due to confusion)    Frequency    Min 2X/week      PT Plan Frequency needs to be updated    Co-evaluation              AM-PAC PT "6 Clicks" Mobility   Outcome Measure  Help needed turning from your back to your side while in a flat bed without using bedrails?: A Little Help needed moving from lying on your back to sitting on the side of a flat bed without using bedrails?: A Little Help needed moving to and from a bed to a chair (including a wheelchair)?: A Lot Help needed standing up from a chair using your arms (e.g., wheelchair or bedside chair)?: A Lot Help needed to walk in hospital room?: A Lot Help needed climbing 3-5 steps with a railing? : Total 6 Click Score: 13    End of Session Equipment Utilized During Treatment: Oxygen;Gait belt Activity Tolerance: Patient limited by fatigue Patient left: in bed;with call bell/phone within reach;with bed alarm set Nurse Communication: Mobility status PT Visit Diagnosis: Unsteadiness on feet (R26.81);Other abnormalities of gait and mobility (R26.89);Difficulty in walking, not elsewhere classified (R26.2)     Time: 8882-8003 PT Time Calculation (min) (ACUTE ONLY): 15 min  Charges:  $Therapeutic Activity: 8-22 mins                     Shirah Roseman W,PT Acute Rehabilitation Services Pager:  [phone]  Office:  [place] 09/01/2019, 2:22 PM

## 2019-09-01 NOTE — Progress Notes (Signed)
PROGRESS NOTE  Michael [name] UJW:[id number] DOB: [birth year] DOA: 08/04/2019 PCP: [facility]  HPI/Recap of past 24 hours: Patient is a 64 year old male with history of bipolar disease, schizophrenia, chronic pancreatitis, hepatitis B, diabetes type 2 admitted with altered mental status, undocumented shortness of breath.  Blood sugar on presentation was greater than 600.  Work-up revealed acute hypoxemic respiratory failure, bilateral pneumonia likely secondary to aspiration, septic shock, diabetes mellitus with ketoacidosis, severe hypokalemia, severe volume depletion, encephalopathy and possible seizure.  He was intubated for acute hypoxic respiratory failure and was put on vent.  Hospital course remarkable for atrial fibrillation, urinary retention due to phimosis, dysphagia.  He was extubated on 7/31.  Patient transferred to [facility] service on 08/26/2019.  Urology was following. Hospital course remarkable for continued requirement of oxygen supplementation for maintenance of saturation.  PT/OT evaluated him and recommended skilled nursing facility on discharge.  Planning for skilled nursing facility placement.  08/29/2019: Patient seen.  No new changes.  Patient is improving slowly.  Available lab records reviewed.  Albumin was 1.4 on 08/19/2019.  Patient has likely sacral pressure ulcer that is currently covered.  The staging of the sacral ulcer is not known.  Healing will be challenging in the face of very low albumin.  Patient needs adequate nutrition.    08/30/2019: Patient seen today.  Patient reports feeling sleepy.  Patient did not finish his lunch.  Will start caloric count.  Will consult dietary team.  Patient's prognosis is guarded.  Will consult palliative care team.  08/31/2019: Patient seen.  Blood sugar significantly elevated.  Will discontinue prednisone.  Will manage elevated blood sugar.  Palliative care input is appreciated.  Patient is awaiting disposition.  09/01/19: Recurrent  Hypoglycemic event this AM.  Added D10W at 30 cc.  Encouraged to use IS and flutter valve to recruit alveoli for his hypoxia.   Assessment/Plan: Active Problems:   AMS (altered mental status)   Shock circulatory (HCC)   Acute respiratory failure requiring reintubation ([place])   Palliative care by specialist   Goals of care, counseling/discussion   DNR (do not resuscitate)   FTT (failure to thrive) in adult   Severe protein-calorie malnutrition ([place])   Muscular deconditioning   Pressure injury of skin  Acute hypoxic respiratory failure: Suspected to be from aspiration pneumonia.  He completed antibiotics.  Had to be intubated on presentation.  Extubated on 7/31.  He still on oxygen at 3-5 L vial cannula.  We will continue to monitor respiratory status.  Intermittent chest x-ray will be done. He was also given few doses of iv  Lasix for congestion.   We will continue oral Lasix on the scheduled doses. Last cX R showed improved aeration in the bilateral lungs with persistent upper lobe predominant infiltrates.  Continue supplemental oxygen as needed.  But continue to attempt to wean the oxygen too.  Looks like he qualified for home oxygen. 08/30/2019: Continue to wean down supplemental oxygen.  Keep O2 sat greater than 91%.  Patient is currently on supplemental oxygen 3 L/min via nasal cannula 08/31/2019: Discontinue prednisone.  Continue DuoNeb and Pulmicort.  Titrate supplemental oxygen as needed.  Resolved. Acute metabolic encephalopathy: Secondary to hypoxia.  Mental status has been improving .  Today he was alert and oriented.  He has history of seizures , bipolar disorder, schizophrenia, neuropathy.  On Keppra, Abilify, gabapentin.  Avoid sedating medications. 08/29/2019: Patient's baseline mental state is nonnontreatment.  Continue to assess.  Collateral information will be  most useful. 08/31/2019: Seems stable.  Able to converse.  Recurrent hypoglycemia suspect 2/2 to poor glycogen reserve    R/o liver disease as an etiology Obtain LFTs Started D10W at 30cc/hr Monitor Continue to encourage increase in oral protein calorie intake and oral supplement  A. fib with RVR: Transient and resolved.Currently in normal sinus rhythm.  Most likely triggered with acute hypoxic respiratory failure.  He was treated with amiodarone.    Continue supportive care.  Not on anticoagulation.  Diastolic congestive heart failure: Echo showed normal left ventricular ejection fraction of 60 to 90%, grade 1 diastolic dysfunction.  He was given few doses of IV Lasix.  We will continue Lasix 40 mg daily.  Thrombocytopenia: Gradually improving without any signs of bleeding.  Suspected to be from cefepime/Pepcid or infectious etiology.  Normocytic anemia: Most likely associated with critical illness and other comorbidities. we will continue to monitor H&H.  Transfuse if necessary  Hypokalemia:  Being monitored and supplemented as needed. 08/29/2019: Potassium today is 4.  Hypernatremia:  Mild.  Continue to monitor 08/29/2019: Encourage adequate oral intake.  Sodium today is 147. 08/30/2019: Repeat renal panel in the morning.  08/31/2019: Sodium is 146.  Encourage free water.  Severe protein calorie malnutrition: Has a history of chronic pancreatitis.  Dietitian following.  Currently on dysphagia 3 diet.  On Creon for chronic pancreatitis  Urine retention/phimosis: Urology following.  Placed Foley catheter.  Recommended to continue Foley catheter until he follows with urology as an outpatient.  Diabetes mellitus with hyperglycemia:  Currently on sliding scale insulin and novolog.   Blood sugars currently elevated due to steroids.   DC steroids.   Control elevated blood sugar.    Debility/deconditioning/disposition: Patient seen by PT and OT and recommended SNF on discharge.  Social worker consulted.  History of cigarette use/possibly undiagnosed COPD versus other chronic lung disease: -O2 sat is  88% on room air. -Patient is needing supplemental oxygen. -DC prednisone due to significantly elevated blood sugar. -Continue nebs DuoNeb and Pulmicort. -Further management will depend on hospital course. -Patient may need to follow with pulmonary team on discharge for complete pulmonary function test.   Nutrition Problem: Severe Malnutrition Etiology: chronic illness (poorly controlled T2DM, pancreatitis)    DVT prophylaxis:SCD Code Status: Full Family Communication: Called and discussed with sister today on phone Status is: Inpatient   Dispo: The patient is from: Home  Anticipated d/c is to: Home with home health  Anticipated d/c date is: 09/03/19  Patient currently is medically not stable for discharge due to recurrent hypoglycemia.  Consultants: PCCM  Procedures:Intubation  Antimicrobials:             Anti-infectives (From admission, onward)     Start     Dose/Rate Route Frequency Ordered Stop   08/17/19 2245  cefTRIAXone (ROCEPHIN) 1 g in sodium chloride 0.9 % 100 mL IVPB        1 g 200 mL/hr over 30 Minutes Intravenous Every 12 hours 08/17/19 2149 08/20/19 1126   08/08/2019 2330  vancomycin (VANCOREADY) IVPB 500 mg/100 mL  Status:  Discontinued        500 mg 100 mL/hr over 60 Minutes Intravenous Every 12 hours 08/06/2019 1056 08/15/19 1131   08/12/2019 2300  ceFEPIme (MAXIPIME) 2 g in sodium chloride 0.9 % 100 mL IVPB  Status:  Discontinued        2 g 200 mL/hr over 30 Minutes Intravenous Every 12 hours 08/04/2019 1056 08/17/19 1833   08/19/2019 1030  vancomycin (  VANCOREADY) IVPB 1250 mg/250 mL        1,250 mg 166.7 mL/hr over 90 Minutes Intravenous  Once 08/15/2019 1019 08/09/2019 1454   08/02/2019 0945  ceFEPIme (MAXIPIME) 2 g in sodium chloride 0.9 % 100 mL IVPB        2 g 200 mL/hr over 30 Minutes Intravenous  Once 07/25/2019 0933 08/06/2019 1148   07/28/2019 0945  vancomycin (VANCOCIN) IVPB 1000 mg/200 mL premix  Status:   Discontinued        1,000 mg 200 mL/hr over 60 Minutes Intravenous  Once 07/28/2019 0933 07/26/2019 1019         Objective: Vitals:   09/01/19 0645 09/01/19 0740 09/01/19 0807 09/01/19 1149  BP: 109/74 92/68  108/73  Pulse: 72 79  75  Resp: 19 20  20   Temp: 97.8 F (36.6 C)     TempSrc:      SpO2: 90% 93% 95% 95%  Weight:      Height:        Intake/Output Summary (Last 24 hours) at 09/01/2019 1500 Last data filed at 09/01/2019 0000 Gross per 24 hour  Intake 175 ml  Output 750 ml  Net -575 ml   Filed Weights   08/29/19 2112 08/31/19 0011 09/01/19 0157  Weight: 60.8 kg 59.8 kg 59.7 kg    Exam:   General: 64 y.o. year-old male chronically ill appearing in no acute distress.  Alert and oriented x3.  Cardiovascular: Regular rate and rhythm with no rubs or gallops.  No thyromegaly or JVD noted.    Respiratory: Mild rales at bases. No wheezing noted.  Abdomen: Soft nontender nondistended with normal bowel sounds x4 quadrants.  Musculoskeletal: Trace lower extremity edema bilaterally  Psychiatry: Mood is appropriate for condition and setting   Data Reviewed: CBC: Recent Labs  Lab 08/26/19 0409 08/27/19 0434 08/31/19 0937  WBC 11.7* 8.8 9.6  NEUTROABS  --  6.7 6.0  HGB 8.5* 9.2* 10.5*  HCT 25.6* 27.9* 32.1*  MCV 94.5 95.9 97.6  PLT 88* 94* 63*   Basic Metabolic Panel: Recent Labs  Lab 08/26/19 0409 08/26/19 0409 08/27/19 0434 08/28/19 0308 08/29/19 0734 08/31/19 0937 09/01/19 0549  NA 147*   < > 149* 142 147* 146* 149*  K 3.4*   < > 3.3* 4.0 4.0 4.2 3.3*  CL 107   < > 111 105 107 105 111  CO2 30   < > 28 24 30 29 27   GLUCOSE 89   < > 141* 214* 149* 251* 42*  BUN 11   < > 13 11 11 16 15   CREATININE 0.40*   < > 0.59* 0.53* 0.50* 0.73 0.53*  CALCIUM 7.5*   < > 7.8* 7.8* 8.0* 7.7* 7.8*  MG 2.0  --   --   --   --  1.5* 1.6*  PHOS 4.2  --   --   --   --  4.8* 3.8   < > = values in this interval not displayed.   GFR: Estimated Creatinine Clearance:  81.2 mL/min (A) (by C-G formula based on SCr of 0.53 mg/dL (L)). Liver Function Tests: Recent Labs  Lab 08/31/19 0937  ALBUMIN 1.3*   No results for input(s): LIPASE, AMYLASE in the last 168 hours. No results for input(s): AMMONIA in the last 168 hours. Coagulation Profile: No results for input(s): INR, PROTIME in the last 168 hours. Cardiac Enzymes: No results for input(s): CKTOTAL, CKMB, CKMBINDEX, TROPONINI in the last 168 hours. BNP (last  3 results) No results for input(s): PROBNP in the last 8760 hours. HbA1C: No results for input(s): HGBA1C in the last 72 hours. CBG: Recent Labs  Lab 09/01/19 0650 09/01/19 0713 09/01/19 0743 09/01/19 1151 09/01/19 1350  GLUCAP 38* 53* 108* 78 105*   Lipid Profile: No results for input(s): CHOL, HDL, LDLCALC, TRIG, CHOLHDL, LDLDIRECT in the last 72 hours. Thyroid Function Tests: No results for input(s): TSH, T4TOTAL, FREET4, T3FREE, THYROIDAB in the last 72 hours. Anemia Panel: No results for input(s): VITAMINB12, FOLATE, FERRITIN, TIBC, IRON, RETICCTPCT in the last 72 hours. Urine analysis:    Component Value Date/Time   COLORURINE STRAW (A) 08/20/2019 1400   APPEARANCEUR HAZY (A) 08/05/2019 1400   LABSPEC 1.024 07/30/2019 1400   PHURINE 6.0 07/30/2019 1400   GLUCOSEU >=500 (A) 08/04/2019 1400   HGBUR SMALL (A) 07/31/2019 1400   BILIRUBINUR NEGATIVE 08/05/2019 1400   KETONESUR NEGATIVE 07/24/2019 1400   PROTEINUR >=300 (A) 08/18/2019 1400   UROBILINOGEN 0.2 12/07/2014 0555   NITRITE NEGATIVE 08/05/2019 1400   LEUKOCYTESUR NEGATIVE 08/13/2019 1400   Sepsis Labs: @LABRCNTIP (procalcitonin:4,lacticidven:4)  )No results found for this or any previous visit (from the past 240 hour(s)).    Studies: No results found.  Scheduled Meds:  ARIPiprazole  1 mg Oral QHS   budesonide (PULMICORT) nebulizer solution  0.25 mg Nebulization BID   chlorhexidine gluconate (MEDLINE KIT)  15 mL Mouth Rinse BID   Chlorhexidine Gluconate  Cloth  6 each Topical Q0600   feeding supplement (NEPRO CARB STEADY)  237 mL Oral BID BM   free water  300 mL Oral Q8H   gabapentin  300 mg Oral BID   insulin aspart  0-6 Units Subcutaneous Q4H   ipratropium-albuterol  3 mL Nebulization BID   levETIRAcetam  500 mg Oral BID   lipase/protease/amylase  36,000 Units Oral TID AC   multivitamin with minerals  1 tablet Oral Daily   nicotine  21 mg Transdermal Daily   pantoprazole  40 mg Oral Daily   sodium chloride flush  10-40 mL Intracatheter Q12H    Continuous Infusions:  sodium chloride 10 mL/hr at 08/25/19 0640   dextrose 30 mL/hr at 09/01/19 1042     LOS: 18 days     Kayleen Memos, MD [facility] Pager [phone]  If 7PM-7AM, please contact night-coverage [email or web address] Password [email or web address] 09/01/2019, 3:00 PM

## 2019-09-02 ENCOUNTER — Inpatient Hospital Stay (HOSPITAL_COMMUNITY): Payer: Medicare HMO

## 2019-09-02 DIAGNOSIS — J9600 Acute respiratory failure, unspecified whether with hypoxia or hypercapnia: Secondary | ICD-10-CM | POA: Diagnosis not present

## 2019-09-02 LAB — BLOOD GAS, ARTERIAL
Acid-Base Excess: 0.8 mmol/L (ref 0.0–2.0)
Bicarbonate: 26 mmol/L (ref 20.0–28.0)
Drawn by: 257081
FIO2: 100
O2 Saturation: 88.6 %
Patient temperature: 36.6
pCO2 arterial: 49.4 mmHg — ABNORMAL HIGH (ref 32.0–48.0)
pH, Arterial: 7.339 — ABNORMAL LOW (ref 7.350–7.450)
pO2, Arterial: 62 mmHg — ABNORMAL LOW (ref 83.0–108.0)

## 2019-09-02 LAB — CBC WITH DIFFERENTIAL/PLATELET

## 2019-09-02 LAB — BASIC METABOLIC PANEL
Anion gap: 8 (ref 5–15)
Anion gap: 11 (ref 5–15)
BUN: 13 mg/dL (ref 8–23)
BUN: 14 mg/dL (ref 8–23)
CO2: 23 mmol/L (ref 22–32)
CO2: 20 mmol/L — ABNORMAL LOW (ref 22–32)
Calcium: 6.9 mg/dL — ABNORMAL LOW (ref 8.9–10.3)
Calcium: 7.1 mg/dL — ABNORMAL LOW (ref 8.9–10.3)
Chloride: 112 mmol/L — ABNORMAL HIGH (ref 98–111)
Chloride: 112 mmol/L — ABNORMAL HIGH (ref 98–111)
Creatinine, Ser: 0.69 mg/dL (ref 0.61–1.24)
Creatinine, Ser: 0.79 mg/dL (ref 0.61–1.24)
GFR calc Af Amer: >60 mL/min (ref >60)
GFR calc Af Amer: >60 mL/min (ref >60)
GFR calc non Af Amer: >60 mL/min (ref >60)
GFR calc non Af Amer: >60 mL/min (ref >60)
Glucose, Bld: 142 mg/dL — ABNORMAL HIGH (ref 70–99)
Glucose, Bld: 81 mg/dL (ref 70–99)
Potassium: 3.9 mmol/L (ref 3.5–5.1)
Potassium: 4.4 mmol/L (ref 3.5–5.1)
Sodium: 143 mmol/L (ref 135–145)
Sodium: 143 mmol/L (ref 135–145)

## 2019-09-02 LAB — POCT I-STAT 7, (LYTES, BLD GAS, ICA,H+H)
Acid-base deficit: 1 mmol/L (ref 0.0–2.0)
Bicarbonate: 26.1 mmol/L (ref 20.0–28.0)
Calcium, Ion: 1.15 mmol/L (ref 1.15–1.40)
HCT: 22 % — ABNORMAL LOW (ref 39.0–52.0)
Hemoglobin: 7.5 g/dL — ABNORMAL LOW (ref 13.0–17.0)
O2 Saturation: 100 %
Patient temperature: 94.4
Potassium: 3 mmol/L — ABNORMAL LOW (ref 3.5–5.1)
Sodium: 146 mmol/L — ABNORMAL HIGH (ref 135–145)
TCO2: 28 mmol/L (ref 22–32)
pCO2 arterial: 50.1 mmHg — ABNORMAL HIGH (ref 32.0–48.0)
pH, Arterial: 7.312 — ABNORMAL LOW (ref 7.350–7.450)
pO2, Arterial: 202 mmHg — ABNORMAL HIGH (ref 83.0–108.0)

## 2019-09-02 LAB — CBC
HCT: 31.5 % — ABNORMAL LOW (ref 39.0–52.0)
Hemoglobin: 9.8 g/dL — ABNORMAL LOW (ref 13.0–17.0)
MCH: 32.5 pg (ref 26.0–34.0)
MCHC: 31.1 g/dL (ref 30.0–36.0)
MCV: 104.3 fL — ABNORMAL HIGH (ref 80.0–100.0)
Platelets: 25 10*3/uL — CL (ref 150–400)
RBC: 3.02 MIL/uL — ABNORMAL LOW (ref 4.22–5.81)
RDW: 19.9 % — ABNORMAL HIGH (ref 11.5–15.5)
WBC: 7.5 10*3/uL (ref 4.0–10.5)
nRBC: 2.5 % — ABNORMAL HIGH (ref 0.0–0.2)

## 2019-09-02 LAB — GLUCOSE, CAPILLARY
Glucose-Capillary: 100 mg/dL — ABNORMAL HIGH (ref 70–99)
Glucose-Capillary: 102 mg/dL — ABNORMAL HIGH (ref 70–99)
Glucose-Capillary: 115 mg/dL — ABNORMAL HIGH (ref 70–99)
Glucose-Capillary: 122 mg/dL — ABNORMAL HIGH (ref 70–99)
Glucose-Capillary: 125 mg/dL — ABNORMAL HIGH (ref 70–99)
Glucose-Capillary: 126 mg/dL — ABNORMAL HIGH (ref 70–99)
Glucose-Capillary: 128 mg/dL — ABNORMAL HIGH (ref 70–99)
Glucose-Capillary: 29 mg/dL — CL (ref 70–99)
Glucose-Capillary: 34 mg/dL — CL (ref 70–99)
Glucose-Capillary: 38 mg/dL — CL (ref 70–99)
Glucose-Capillary: 43 mg/dL — CL (ref 70–99)
Glucose-Capillary: 45 mg/dL — ABNORMAL LOW (ref 70–99)
Glucose-Capillary: 62 mg/dL — ABNORMAL LOW (ref 70–99)
Glucose-Capillary: 68 mg/dL — ABNORMAL LOW (ref 70–99)
Glucose-Capillary: 69 mg/dL — ABNORMAL LOW (ref 70–99)
Glucose-Capillary: 87 mg/dL (ref 70–99)
Glucose-Capillary: 91 mg/dL (ref 70–99)

## 2019-09-02 LAB — COMPREHENSIVE METABOLIC PANEL
ALT: 46 U/L — ABNORMAL HIGH (ref 0–44)
AST: 47 U/L — ABNORMAL HIGH (ref 15–41)
Albumin: 1.4 g/dL — ABNORMAL LOW (ref 3.5–5.0)
Alkaline Phosphatase: 585 U/L — ABNORMAL HIGH (ref 38–126)
Anion gap: 9 (ref 5–15)
BUN: 12 mg/dL (ref 8–23)
CO2: 29 mmol/L (ref 22–32)
Calcium: 7.8 mg/dL — ABNORMAL LOW (ref 8.9–10.3)
Chloride: 109 mmol/L (ref 98–111)
Creatinine, Ser: 0.46 mg/dL — ABNORMAL LOW (ref 0.61–1.24)
GFR calc Af Amer: >60 mL/min (ref >60)
GFR calc non Af Amer: >60 mL/min (ref >60)
Glucose, Bld: 68 mg/dL — ABNORMAL LOW (ref 70–99)
Potassium: 4 mmol/L (ref 3.5–5.1)
Sodium: 147 mmol/L — ABNORMAL HIGH (ref 135–145)
Total Bilirubin: 0.8 mg/dL (ref 0.3–1.2)
Total Protein: 4.8 g/dL — ABNORMAL LOW (ref 6.5–8.1)

## 2019-09-02 LAB — AMYLASE: Amylase: 83 U/L (ref 28–100)

## 2019-09-02 LAB — LACTIC ACID, PLASMA
Lactic Acid, Venous: 3.2 mmol/L (ref 0.5–1.9)
Lactic Acid, Venous: 3.7 mmol/L (ref 0.5–1.9)
Lactic Acid, Venous: 8.2 mmol/L (ref 0.5–1.9)

## 2019-09-02 LAB — PHOSPHORUS
Phosphorus: 3.2 mg/dL (ref 2.5–4.6)
Phosphorus: 3.6 mg/dL (ref 2.5–4.6)

## 2019-09-02 LAB — HEMOGLOBIN A1C
Hgb A1c MFr Bld: 11.1 % — ABNORMAL HIGH (ref 4.8–5.6)
Mean Plasma Glucose: 271.87 mg/dL

## 2019-09-02 LAB — MAGNESIUM
Magnesium: 2 mg/dL (ref 1.7–2.4)
Magnesium: 1.7 mg/dL (ref 1.7–2.4)
Magnesium: 2.2 mg/dL (ref 1.7–2.4)

## 2019-09-02 LAB — LIPASE, BLOOD: Lipase: 14 U/L (ref 11–51)

## 2019-09-02 LAB — HIV ANTIBODY (ROUTINE TESTING W REFLEX): HIV Screen 4th Generation wRfx: NONREACTIVE

## 2019-09-02 MED ORDER — MIDAZOLAM HCL 2 MG/2ML IJ SOLN: 2.0000 mg | Freq: Once | INTRAMUSCULAR | Status: AC

## 2019-09-02 MED ORDER — DEXTROSE 50 % IV SOLN
INTRAVENOUS | Status: AC
  Administered 2019-09-02: 50 mL
  Filled 2019-09-02: qty 50

## 2019-09-02 MED ORDER — FENTANYL CITRATE (PF) 100 MCG/2ML IJ SOLN
INTRAMUSCULAR | Status: AC
  Administered 2019-09-02: 100 ug via INTRAVENOUS
  Filled 2019-09-02: qty 2

## 2019-09-02 MED ORDER — POTASSIUM CHLORIDE 10 MEQ/100ML IV SOLN
10.0000 meq | INTRAVENOUS | Status: AC
  Administered 2019-09-02 (×6): 10 meq via INTRAVENOUS
  Filled 2019-09-02 (×7): qty 100

## 2019-09-02 MED ORDER — ETOMIDATE 2 MG/ML IV SOLN
INTRAVENOUS | Status: AC
  Administered 2019-09-02: 10 mg via INTRAVENOUS
  Filled 2019-09-02: qty 20

## 2019-09-02 MED ORDER — CHLORHEXIDINE GLUCONATE 0.12% ORAL RINSE (MEDLINE KIT)
15.0000 mL | Freq: Two times a day (BID) | OROMUCOSAL | Status: DC
  Administered 2019-09-02 – 2019-09-09 (×14): 15 mL via OROMUCOSAL

## 2019-09-02 MED ORDER — NICOTINE 7 MG/24HR TD PT24
7.0000 mg | MEDICATED_PATCH | Freq: Every day | TRANSDERMAL | Status: DC
  Administered 2019-09-03 – 2019-09-05 (×3): 7 mg via TRANSDERMAL
  Filled 2019-09-02 (×3): qty 1

## 2019-09-02 MED ORDER — MIDAZOLAM HCL 2 MG/2ML IJ SOLN
INTRAMUSCULAR | Status: AC
  Administered 2019-09-02: 2 mg via INTRAVENOUS
  Filled 2019-09-02: qty 4

## 2019-09-02 MED ORDER — NOREPINEPHRINE 4 MG/250ML-% IV SOLN
0.0000 ug/min | INTRAVENOUS | Status: DC
  Administered 2019-09-02: 10 ug/min via INTRAVENOUS
  Filled 2019-09-02: qty 250

## 2019-09-02 MED ORDER — DEXTROSE 50 % IV SOLN: 1.0000 | Freq: Once | INTRAVENOUS | Status: AC

## 2019-09-02 MED ORDER — LACTATED RINGERS IV SOLN: INTRAVENOUS | Status: DC

## 2019-09-02 MED ORDER — ADULT MULTIVITAMIN W/MINERALS CH
1.0000 | ORAL_TABLET | Freq: Every day | ORAL | Status: DC
  Administered 2019-09-03 – 2019-09-09 (×7): 1
  Filled 2019-09-02 (×7): qty 1

## 2019-09-02 MED ORDER — ROCURONIUM BROMIDE 50 MG/5ML IV SOLN
70.0000 mg | Freq: Once | INTRAVENOUS | Status: DC
  Filled 2019-09-02: qty 7

## 2019-09-02 MED ORDER — FENTANYL CITRATE (PF) 100 MCG/2ML IJ SOLN: 100.0000 ug | Freq: Once | INTRAMUSCULAR | Status: AC

## 2019-09-02 MED ORDER — SODIUM CHLORIDE 0.9 % IV SOLN
2.0000 g | Freq: Three times a day (TID) | INTRAVENOUS | Status: DC
  Administered 2019-09-02 – 2019-09-04 (×6): 2 g via INTRAVENOUS
  Filled 2019-09-02 (×6): qty 2

## 2019-09-02 MED ORDER — SODIUM CHLORIDE 0.9 % IV BOLUS
1000.0000 mL | Freq: Once | INTRAVENOUS | Status: AC
  Administered 2019-09-02: 1000 mL via INTRAVENOUS

## 2019-09-02 MED ORDER — VANCOMYCIN HCL 1250 MG/250ML IV SOLN
1250.0000 mg | Freq: Once | INTRAVENOUS | Status: AC
  Administered 2019-09-02: 1250 mg via INTRAVENOUS
  Filled 2019-09-02: qty 250

## 2019-09-02 MED ORDER — MIDAZOLAM HCL 2 MG/2ML IJ SOLN
2.0000 mg | INTRAMUSCULAR | Status: DC | PRN
  Administered 2019-09-04 – 2019-09-07 (×4): 2 mg via INTRAVENOUS
  Filled 2019-09-02 (×5): qty 2

## 2019-09-02 MED ORDER — IPRATROPIUM-ALBUTEROL 0.5-2.5 (3) MG/3ML IN SOLN
3.0000 mL | Freq: Four times a day (QID) | RESPIRATORY_TRACT | Status: DC
  Administered 2019-09-02 – 2019-09-07 (×22): 3 mL via RESPIRATORY_TRACT
  Filled 2019-09-02 (×22): qty 3

## 2019-09-02 MED ORDER — ROCURONIUM BROMIDE 10 MG/ML (PF) SYRINGE
PREFILLED_SYRINGE | INTRAVENOUS | Status: AC
  Administered 2019-09-02: 100 mg
  Filled 2019-09-02: qty 10

## 2019-09-02 MED ORDER — VITAL AF 1.2 CAL PO LIQD
1000.0000 mL | ORAL | Status: DC
  Administered 2019-09-02 – 2019-09-07 (×6): 1000 mL

## 2019-09-02 MED ORDER — NOREPINEPHRINE 16 MG/250ML-% IV SOLN
0.0000 ug/min | INTRAVENOUS | Status: DC
  Administered 2019-09-02 (×2): 40 ug/min via INTRAVENOUS
  Administered 2019-09-03 (×3): 60 ug/min via INTRAVENOUS
  Administered 2019-09-03: 55 ug/min via INTRAVENOUS
  Administered 2019-09-03: 50 ug/min via INTRAVENOUS
  Administered 2019-09-04 – 2019-09-05 (×6): 60 ug/min via INTRAVENOUS
  Administered 2019-09-05: 50 ug/min via INTRAVENOUS
  Administered 2019-09-05: 60 ug/min via INTRAVENOUS
  Administered 2019-09-06: 32 ug/min via INTRAVENOUS
  Administered 2019-09-06: 30 ug/min via INTRAVENOUS
  Administered 2019-09-06: 35 ug/min via INTRAVENOUS
  Administered 2019-09-07: 32 ug/min via INTRAVENOUS
  Administered 2019-09-07 – 2019-09-08 (×4): 45 ug/min via INTRAVENOUS
  Administered 2019-09-08 – 2019-09-09 (×3): 42 ug/min via INTRAVENOUS
  Filled 2019-09-02 (×14): qty 250
  Filled 2019-09-02: qty 500
  Filled 2019-09-02 (×12): qty 250

## 2019-09-02 MED ORDER — PROSOURCE TF PO LIQD
45.0000 mL | Freq: Every day | ORAL | Status: DC
  Administered 2019-09-02 – 2019-09-05 (×4): 45 mL
  Filled 2019-09-02 (×4): qty 45

## 2019-09-02 MED ORDER — VANCOMYCIN HCL 750 MG/150ML IV SOLN
750.0000 mg | Freq: Two times a day (BID) | INTRAVENOUS | Status: DC
  Administered 2019-09-03: 750 mg via INTRAVENOUS
  Filled 2019-09-02 (×2): qty 150

## 2019-09-02 MED ORDER — ORAL CARE MOUTH RINSE
15.0000 mL | OROMUCOSAL | Status: DC
  Administered 2019-09-02 – 2019-09-09 (×68): 15 mL via OROMUCOSAL

## 2019-09-02 MED ORDER — FENTANYL CITRATE (PF) 100 MCG/2ML IJ SOLN
50.0000 ug | INTRAMUSCULAR | Status: DC | PRN
  Administered 2019-09-02 (×3): 50 ug via INTRAVENOUS
  Administered 2019-09-02 – 2019-09-03 (×2): 100 ug via INTRAVENOUS
  Administered 2019-09-03 (×2): 50 ug via INTRAVENOUS
  Administered 2019-09-03: 100 ug via INTRAVENOUS
  Administered 2019-09-04: 200 ug via INTRAVENOUS
  Administered 2019-09-04 (×3): 100 ug via INTRAVENOUS
  Administered 2019-09-04: 200 ug via INTRAVENOUS
  Administered 2019-09-04: 100 ug via INTRAVENOUS
  Administered 2019-09-05: 200 ug via INTRAVENOUS
  Administered 2019-09-05: 100 ug via INTRAVENOUS
  Administered 2019-09-06 – 2019-09-07 (×2): 200 ug via INTRAVENOUS
  Administered 2019-09-07: 100 ug via INTRAVENOUS
  Administered 2019-09-07 – 2019-09-08 (×3): 200 ug via INTRAVENOUS
  Administered 2019-09-08 – 2019-09-09 (×3): 100 ug via INTRAVENOUS
  Administered 2019-09-09 (×3): 200 ug via INTRAVENOUS
  Filled 2019-09-02 (×4): qty 4
  Filled 2019-09-02 (×7): qty 2
  Filled 2019-09-02: qty 4
  Filled 2019-09-02: qty 2
  Filled 2019-09-02: qty 4
  Filled 2019-09-02: qty 2
  Filled 2019-09-02: qty 4
  Filled 2019-09-02: qty 2
  Filled 2019-09-02 (×3): qty 4
  Filled 2019-09-02: qty 2
  Filled 2019-09-02 (×2): qty 4
  Filled 2019-09-02 (×3): qty 2

## 2019-09-02 MED ORDER — MAGNESIUM SULFATE 2 GM/50ML IV SOLN
2.0000 g | Freq: Once | INTRAVENOUS | Status: AC
  Administered 2019-09-02: 2 g via INTRAVENOUS
  Filled 2019-09-02: qty 50

## 2019-09-02 MED ORDER — DEXTROSE 50 % IV SOLN
INTRAVENOUS | Status: AC
  Administered 2019-09-02: 50 mL via INTRAVENOUS
  Filled 2019-09-02: qty 50

## 2019-09-02 MED ORDER — LACTATED RINGERS IV BOLUS
1000.0000 mL | Freq: Once | INTRAVENOUS | Status: AC
  Administered 2019-09-02: 1000 mL via INTRAVENOUS

## 2019-09-02 MED ORDER — LEVETIRACETAM IN NACL 500 MG/100ML IV SOLN
500.0000 mg | Freq: Two times a day (BID) | INTRAVENOUS | Status: DC
  Administered 2019-09-02 – 2019-09-09 (×14): 500 mg via INTRAVENOUS
  Filled 2019-09-02 (×14): qty 100

## 2019-09-02 MED ORDER — VASOPRESSIN 20 UNITS/100 ML INFUSION FOR SHOCK
0.0000 [IU]/min | INTRAVENOUS | Status: DC
  Administered 2019-09-02 (×2): 0.03 [IU]/min via INTRAVENOUS
  Administered 2019-09-03 – 2019-09-08 (×14): 0.04 [IU]/min via INTRAVENOUS
  Filled 2019-09-02 (×22): qty 100

## 2019-09-02 MED ORDER — MIDODRINE HCL 5 MG PO TABS: 10.0000 mg | ORAL_TABLET | Freq: Three times a day (TID) | ORAL | Status: DC

## 2019-09-02 MED ORDER — DEXTROSE 50 % IV SOLN
50.0000 mL | Freq: Once | INTRAVENOUS | Status: AC
  Administered 2019-09-02: 50 mL via INTRAVENOUS

## 2019-09-02 MED ORDER — ETOMIDATE 2 MG/ML IV SOLN: 10.0000 mg | Freq: Once | INTRAVENOUS | Status: AC

## 2019-09-02 MED ORDER — PANTOPRAZOLE SODIUM 40 MG IV SOLR
40.0000 mg | INTRAVENOUS | Status: DC
  Administered 2019-09-02 – 2019-09-07 (×6): 40 mg via INTRAVENOUS
  Filled 2019-09-02 (×6): qty 40

## 2019-09-02 MED ORDER — MIDODRINE HCL 5 MG PO TABS
10.0000 mg | ORAL_TABLET | Freq: Three times a day (TID) | ORAL | Status: DC
  Administered 2019-09-02 – 2019-09-03 (×3): 10 mg
  Filled 2019-09-02 (×3): qty 2

## 2019-09-02 NOTE — Progress Notes (Signed)
PROGRESS NOTE  Michael [name] HDQ:[id number] DOB: [birth year] DOA: 08/01/2019 PCP: [facility]  HPI/Recap of past 24 hours: Patient is a 64 year old male with history of bipolar disease, schizophrenia, chronic pancreatitis, hepatitis B, diabetes type 2 admitted with altered mental status.  Blood sugar on presentation was greater than 600.  Work-up revealed acute hypoxemic respiratory failure, bilateral pneumonia likely secondary to aspiration, septic shock, diabetes mellitus with ketoacidosis, severe hypokalemia, severe volume depletion, encephalopathy and possible seizure.  He was intubated for acute hypoxic respiratory failure and admitted to the ICU.    Hospital course complicated by atrial fibrillation, now off amiodarone, urinary retention due to phimosis, dysphagia.  He was extubated on 7/31.  Patient transferred to [facility] service on 08/26/2019.  Urology followed.  Generalized weakness for which PT/OT evaluated and recommended skilled nursing facility on discharge.  Also complicated by recurrent episodes of hypoglycemia with concern for low glycogen stores for which he was started on low rate of D10W at 30cc/hr on 09/01/19.  Elevated transaminases, with ongoing workup, acute hepatitis panel and abdominal US.   09/02/19:  This morning during round, noted increase work of breathing and lethargy with diffused rhonchorous lung sounds worse on the right with suspicion for aspiration, possible silent aspiration.  Recently on mechanical soft nectar thick liquid diet.    His sister who is his medical POA updated.  She reversed his DNR to FULL CODE.  Decision was made to transfer to a higher level of care ICU due to his acute respiratory distress and severe hypotension.  Reviewed CXR, suggestive of pneumonitis and pulmonary edema.  Started on levophed and received NS bolus due to hypovolemia.    Assessment/Plan: Active Problems:   AMS (altered mental status)   Shock circulatory (HCC)    Acute respiratory failure requiring reintubation ([place])   Palliative care by specialist   Goals of care, counseling/discussion   DNR (do not resuscitate)   FTT (failure to thrive) in adult   Severe protein-calorie malnutrition ([place])   Muscular deconditioning   Pressure injury of skin  Severe Acute hypoxic respiratory failure suspect 2/2 to aspiration requiring invasive mechanical ventilation:  Had to be intubated on presentation and admitted to ICU.  Extubated on 08/23/19.  Transferred to [facility] on 08/26/19.  Completed course of abx.  Recently on 2 L vial cannula with O2 sat in the mid 90's.                 Diffused rhonchorous lung sounds worse on the right with suspicion for                   aspiration this AM, possible silent aspiration.  He was recently on                             mechanical soft nectar thick liquid diet.                Reviewed CXR, suggestive of pneumonitis and pulmonary edema.                           Started on levophed and received NS bolus due to hypovolemia. Now transferred back to the ICU and intubated.  Severe hypotension with shock, unclear etiology Obtain CBC with differential and blood cultures Started on IV antibiotics empirically due to hemodynamic instability until infective process is ruled out Received 1 L normal saline bolus.  Currently on Levophed.  Acute metabolic encephalopathy likely 2/2 to severe hypoxia:   Recurrent hypoglycemia suspect 2/2 to poor glycogen stores Elevated transaminases Acute hepatitis panel and abdominal ultrasound, ordered Avoid hepatotoxins and monitor LFTs Was started on D10W at 30cc/hr on 09/01/19 to treat recurrent hypoglycemia.  Paroxysmal A. fib with RVR:  Most likely triggered with acute hypoxic respiratory failure.   He was treated with amiodarone.  Not on anticoagulation.  Diastolic congestive heart failure: Echo showed normal left ventricular ejection fraction of 60 to 90%, grade 1 diastolic dysfunction.  He was  given few doses of IV Lasix.   Acute Thrombocytopenia:  Suspect 2/2 to acute illness No overt bleeding  Normocytic anemia:  Most likely associated with critical illness and other comorbidities.  Hg 10.5 from 9.2  Resolved post repletion: Hypokalemia:  K+ 4.0 from 3.3  Hypovolemic Hypernatremia, improved:  Serum sodium 146 from 149 Free water flushes when OG tube in place.  Severe protein calorie malnutrition: Has a history of chronic pancreatitis.  Dietitian following.   On Creon for chronic pancreatitis  Urine retention/phimosis:  Urology followed.   Placed Foley catheter.   Recommended to continue Foley catheter until he follows with urology as an outpatient.  Diabetes mellitus type 2 with hypoglycemia A1C 11.1 on 09/02/19 Dced insulin due to recurrent episodes of hypoglycemia' Continue to monitor CBGs.   Debility/deconditioning/disposition: Patient seen by PT and OT and recommended SNF on discharge.  Social worker consulted.  History of cigarette use/possibly undiagnosed COPD versus other chronic lung disease:   Nutrition Problem: Severe Malnutrition Etiology: chronic illness (poorly controlled T2DM, pancreatitis)    DVT prophylaxis: SCDs, defer to PCCM to add SQ lovenox daily now that he is intubated and sedated on mechanical ventilation. Code Status: Full Family Communication: Called and updated his sister via phone on 09/02/19  Status is: Inpatient   Dispo: The patient is from: Home  Anticipated d/c is to: Home with home health  Anticipated d/c date is: 09/07/19  Patient currently is medically not stable for discharge due to severe acute hypoxic respiratory failure now on invasive mechanical ventilation.  Consultants: PCCM  Procedures:Intubation  Antimicrobials:             Anti-infectives (From admission, onward)     Start     Dose/Rate Route Frequency Ordered Stop   08/17/19 2245  cefTRIAXone  (ROCEPHIN) 1 g in sodium chloride 0.9 % 100 mL IVPB        1 g 200 mL/hr over 30 Minutes Intravenous Every 12 hours 08/17/19 2149 08/20/19 1126   08/05/2019 2330  vancomycin (VANCOREADY) IVPB 500 mg/100 mL  Status:  Discontinued        500 mg 100 mL/hr over 60 Minutes Intravenous Every 12 hours 08/17/2019 1056 08/15/19 1131   08/13/2019 2300  ceFEPIme (MAXIPIME) 2 g in sodium chloride 0.9 % 100 mL IVPB  Status:  Discontinued        2 g 200 mL/hr over 30 Minutes Intravenous Every 12 hours 08/01/2019 1056 08/17/19 1833   08/06/2019 1030  vancomycin (VANCOREADY) IVPB 1250 mg/250 mL        1,250 mg 166.7 mL/hr over 90 Minutes Intravenous  Once 08/02/2019 1019 08/13/2019 1454   07/31/2019 0945  ceFEPIme (MAXIPIME) 2 g in sodium chloride 0.9 % 100 mL IVPB        2 g 200 mL/hr over 30 Minutes Intravenous  Once 07/24/2019 0933 07/31/2019 1148   07/27/2019 0945  vancomycin (VANCOCIN) IVPB 1000 mg/200 mL  premix  Status:  Discontinued        1,000 mg 200 mL/hr over 60 Minutes Intravenous  Once 08/21/2019 0933 08/09/2019 1019         Objective: Vitals:   09/02/19 0907 09/02/19 0911 09/02/19 0916 09/02/19 1026  BP: (!) 55/45 (!) 58/40 (!) 53/42 (!) 71/46  Pulse: (!) 107 (!) 108 97   Resp:      Temp:      TempSrc:      SpO2:   (!) 60%   Weight:      Height:        Intake/Output Summary (Last 24 hours) at 09/02/2019 1115 Last data filed at 09/02/2019 4496 Gross per 24 hour  Intake 874.54 ml  Output 600 ml  Net 274.54 ml   Filed Weights   08/31/19 0011 09/01/19 0157 09/02/19 0450  Weight: 59.8 kg 59.7 kg 61.9 kg    Exam:  . General: 64 y.o. year-old male chronically ill appearing lethargic with increase work of breathing. . Cardiovascular: Regular rate and rhythm no rubs or gallops.   Marland Kitchen Respiratory: Diffuse rales bilaterally worse on the right.  Increased work of breathing.   . Abdomen: Soft nontender normal bowel sounds present.   . Musculoskeletal: No lower extremity edema bilaterally.    Marland Kitchen Psychiatry: Unable to assess mood due to lethargy.  Data Reviewed: CBC: Recent Labs  Lab 08/27/19 0434 08/31/19 0937  WBC 8.8 9.6  NEUTROABS 6.7 6.0  HGB 9.2* 10.5*  HCT 27.9* 32.1*  MCV 95.9 97.6  PLT 94* 63*   Basic Metabolic Panel: Recent Labs  Lab 08/28/19 0308 08/29/19 0734 08/31/19 0937 09/01/19 0549 09/02/19 0240  NA 142 147* 146* 149* 147*  K 4.0 4.0 4.2 3.3* 4.0  CL 105 107 105 111 109  CO2 24 30 29 27 29   GLUCOSE 214* 149* 251* 42* 68*  BUN 11 11 16 15 12   CREATININE 0.53* 0.50* 0.73 0.53* 0.46*  CALCIUM 7.8* 8.0* 7.7* 7.8* 7.8*  MG  --   --  1.5* 1.6* 2.0  PHOS  --   --  4.8* 3.8  --    GFR: Estimated Creatinine Clearance: 81.2 mL/min (A) (by C-G formula based on SCr of 0.46 mg/dL (L)). Liver Function Tests: Recent Labs  Lab 08/31/19 0937 09/02/19 0240  AST  --  47*  ALT  --  46*  ALKPHOS  --  585*  BILITOT  --  0.8  PROT  --  4.8*  ALBUMIN 1.3* 1.4*   No results for input(s): LIPASE, AMYLASE in the last 168 hours. No results for input(s): AMMONIA in the last 168 hours. Coagulation Profile: No results for input(s): INR, PROTIME in the last 168 hours. Cardiac Enzymes: No results for input(s): CKTOTAL, CKMB, CKMBINDEX, TROPONINI in the last 168 hours. BNP (last 3 results) No results for input(s): PROBNP in the last 8760 hours. HbA1C: Recent Labs    09/02/19 0240  HGBA1C 11.1*   CBG: Recent Labs  Lab 09/02/19 0454 09/02/19 0647 09/02/19 0830 09/02/19 0955 09/02/19 1009  GLUCAP 122* 126* 91 34* 87   Lipid Profile: No results for input(s): CHOL, HDL, LDLCALC, TRIG, CHOLHDL, LDLDIRECT in the last 72 hours. Thyroid Function Tests: No results for input(s): TSH, T4TOTAL, FREET4, T3FREE, THYROIDAB in the last 72 hours. Anemia Panel: No results for input(s): VITAMINB12, FOLATE, FERRITIN, TIBC, IRON, RETICCTPCT in the last 72 hours. Urine analysis:    Component Value Date/Time   COLORURINE STRAW (A) 08/15/2019 1400  APPEARANCEUR  HAZY (A) 07/29/2019 1400   LABSPEC 1.024 07/24/2019 1400   PHURINE 6.0 08/01/2019 1400   GLUCOSEU >=500 (A) 08/22/2019 1400   HGBUR SMALL (A) 08/13/2019 1400   BILIRUBINUR NEGATIVE 08/18/2019 1400   KETONESUR NEGATIVE 08/22/2019 1400   PROTEINUR >=300 (A) 08/01/2019 1400   UROBILINOGEN 0.2 12/07/2014 0555   NITRITE NEGATIVE 07/25/2019 1400   LEUKOCYTESUR NEGATIVE 08/10/2019 1400   Sepsis Labs: @LABRCNTIP (procalcitonin:4,lacticidven:4)  )No results found for this or any previous visit (from the past 240 hour(s)).    Studies: Portable Chest x-ray  Result Date: 09/02/2019 CLINICAL DATA:  Intubation and central line placement. Respiratory failure. EXAM: PORTABLE CHEST 1 VIEW COMPARISON:  Earlier film, same date. FINDINGS: The endotracheal tube is in the right mainstem bronchus at least 3 cm. It should be retracted at least 5 cm. The NG tube is coursing down the esophagus and into the stomach. Right IJ central venous catheter tip is in the distal SVC near the cavoatrial junction. Persistent upper lobe predominant airspace process IMPRESSION: 1. The endotracheal tube is in the right mainstem bronchus and should be retracted at least 5 cm. 2. Right IJ central venous catheter and NG tube in good position. 3. Persistent upper lobe predominant airspace process. These results will be called to the ordering clinician or representative by the Radiologist Assistant, and communication documented in the PACS or [employer]. Electronically Signed   By: Marijo Sanes M.D.   On: 09/02/2019 11:09   DG CHEST PORT 1 VIEW  Result Date: 09/02/2019 CLINICAL DATA:  Shortness of breath and hypoxia EXAM: PORTABLE CHEST 1 VIEW COMPARISON:  08/27/2019 FINDINGS: Cardiac shadow is stable. Persistent airspace opacities are noted predominately in the upper lobes bilaterally. Some hilar retraction is noted. The overall appearance is stable from the prior study. IMPRESSION: No new focal abnormality is noted. Persistent  bilateral airspace opacities are seen. Electronically Signed   By: Inez Catalina M.D.   On: 09/02/2019 09:05    Scheduled Meds: . ARIPiprazole  1 mg Oral QHS  . budesonide (PULMICORT) nebulizer solution  0.25 mg Nebulization BID  . chlorhexidine gluconate (MEDLINE KIT)  15 mL Mouth Rinse BID  . Chlorhexidine Gluconate Cloth  6 each Topical Q0600  . feeding supplement (NEPRO CARB STEADY)  237 mL Oral BID BM  . free water  300 mL Oral Q8H  . gabapentin  300 mg Oral BID  . ipratropium-albuterol  3 mL Nebulization BID  . levETIRAcetam  500 mg Oral BID  . lipase/protease/amylase  36,000 Units Oral TID AC  . midodrine  10 mg Oral TID WC  . multivitamin with minerals  1 tablet Oral Daily  . nicotine  21 mg Transdermal Daily  . pantoprazole  40 mg Oral Daily  . potassium chloride  40 mEq Oral BID  . rocuronium  70 mg Intravenous Once  . sodium chloride flush  10-40 mL Intracatheter Q12H    Continuous Infusions: . sodium chloride 10 mL/hr at 08/25/19 0640  . dextrose 10 mL/hr at 09/02/19 0850  . norepinephrine (LEVOPHED) Adult infusion    . vasopressin       LOS: 19 days     Kayleen Memos, MD [facility] Pager [phone]  If 7PM-7AM, please contact night-coverage [email or web address] Password [email or web address] 09/02/2019, 11:15 AM

## 2019-09-02 NOTE — Progress Notes (Signed)
Pt had two episodes of hypoglycemia with CBG 43 and 38. Pt received D50x2 and D10 was restarted per previous orders. Continue to monitor the pt. Closely.

## 2019-09-02 NOTE — Progress Notes (Signed)
Dr. Marva Panda was notified about critical lab value of platelets 25 and low bloody urine output. No new orders. Continue to monitor the patient.

## 2019-09-02 NOTE — Progress Notes (Signed)
[place] Progress Note Patient Name: Michael [name] DOB: 21-Apr-1955 MRN: [id number]   Date of Service  09/02/2019  HPI/Events of Note  Lactic Acid = 3.2 --> 3.7. Hgb = 9.8. CVP = NA.   eICU Interventions  Plan: 1. Monitor CVP now and Q 4 hours. 3. Bolus with 0.9 NaCl 1 liter IV over 1 hour now.  4. Continue to trend Lactic Acid.      Intervention Category Major Interventions: Acid-Base disturbance - evaluation and management  Tanysha Quant [place] 09/02/2019, 9:10 PM

## 2019-09-02 NOTE — Progress Notes (Signed)
At 0815, patient was found standing on the side of the bed standing in a puddle of blood after pulling out his indwelling foley catheter. Balloon was intact. Patient was assisted into bed and cleaned up. Patient noted to have shortness of breath, oxygen saturation in the 80s, bp 82/62. Respiratory at the bedside giving a breathing treatment and then placed on non-rebreather. Dr. [place] Crane also at bedside. Rapid response called.   0857: BP noted to be 53/26. Dr. [place] Crane ordered levophed and started.   1000: Patient transferred to 2M09. Report given to Michelene Heady, RN at the bedside.

## 2019-09-02 NOTE — Progress Notes (Signed)
Rounding on patient this AM.  He is in respiratory distress.  Lungs ronchorous bilaterally worse on the right.  Increase work of breathing with altered mental status.  Stat CXR and ABG ordered.    Called the patient's sister [name], who is also medical POA, to give updates. [name] reversed code status to FULL CODE.

## 2019-09-02 NOTE — Progress Notes (Signed)
TF was initiated and D10 was stopped per Dr. Marva Panda order. Continue to monitor CBG q4 hr or more frequent with signs/symptoms of hypoglycemia.

## 2019-09-02 NOTE — Procedures (Signed)
Central Venous Catheter Insertion Procedure Note Michael [name] [id number] 1955/12/05  Procedure: Insertion of Central Venous Catheter Indications: Assessment of intravascular volume, Drug and/or fluid administration and Frequent blood sampling  Procedure Details Consent: Unable to obtain consent because of emergent medical necessity. Time Out: Verified patient identification, verified procedure, site/side was marked, verified correct patient position, special equipment/implants available, medications/allergies/relevent history reviewed, required imaging and test results available.  Performed  Maximum sterile technique was used including antiseptics, cap, gloves, gown, hand hygiene, mask and sheet. Skin prep: Chlorhexidine; local anesthetic administered A antimicrobial bonded/coated triple lumen catheter was placed in the right internal jugular vein using the Seldinger technique.  Evaluation Blood flow good Complications: No apparent complications Patient did tolerate procedure well. Chest X-ray ordered to verify placement.  CXR: normal.   Procedure performed under supervision of Eric Form, NP and Dr. Josph Macho, MD Internal Medicine, PGY-2 09/02/19 11:18 AM Pager # [phone]

## 2019-09-02 NOTE — Procedures (Signed)
Intubation Procedure Note JAYLEND REILAND [id number] 07/07/1955  Procedure: Intubation Indications: Airway protection and maintenance  Procedure Details Consent: Unable to obtain consent because of emergent medical necessity. Time Out: Verified patient identification, verified procedure, site/side was marked, verified correct patient position, special equipment/implants available, medications/allergies/relevent history reviewed, required imaging and test results available.  Performed  Evaluation Hemodynamic Status: Persistent hypotension treated with pressors and fluid; O2 sats: unable to read via pulse ox Patient's Current Condition: stable Complications: No apparent complications Patient did tolerate procedure well. Chest X-ray ordered to verify placement.  CXR: tube position low-repostitioned.   Harvie Heck, MD Internal Medicine, PGY-2 09/02/19 11:14 AM Pager # [phone]

## 2019-09-02 NOTE — Progress Notes (Signed)
NAME:  Michael [name], MRN:  [id number], DOB:  10-08-1955, LOS: 31 ADMISSION DATE:  07/31/2019, CONSULTATION DATE:  09/02/2019 REFERRING MD:  [place] Crane, CHIEF COMPLAINT:  AMS, acute respiratory distress   Brief History   64 year old male with history of uncontrolled insulin-dependent diabetes mellitus presented on 08/20/2019 with seizure activity, altered mental status, hyperglycemia and hypokalemia. Initially intubated and admitted to PCCM unil 08/26/2019 when transfer to hospitalist service.  On the morning of 09/02/2019, patient with altered mental status, increased respiratory rate and hypoxia requiring non-rebreather mask. Also noted to be significantly hypotensive for which vasopressors started. PCCM reconsulted and patient required transfer back to ICU where re-intubated for respiratory distress and also on vasopressors.   History of present illness   [name] is a 64 year old male with history of bipolar disorder, schizophrenia, chronic pancreatitis, hepatitis B, and uncontrolled diabetes mellitus initially admitted on 7/22 with acute hypoxemic respiratory failure, aspiration pneumonia, septic shock secondary to aspiration pneumonia, HHS, seizure activity and encephalopathy requiring intubation and ICU admission. He was treated and extubated on 7/31 and transferred to the floors on 08/26/2019.   Past Medical History   Past Medical History:  Diagnosis Date  . Anxiety   . Arthritis    "joints; shoulders; feet" (06/08/2016)  . Bipolar disorder ([place])   . Daily headache    "7:30 - 8:00 q night" (06/08/2016)  . Diabetic peripheral neuropathy ([place])   . GERD (gastroesophageal reflux disease)   . History of hepatitis B virus infection conferring immunity 03/22/2018  . Pancreatitis   . Schizophrenia ([place])   . Seizures ([place]) 01/2016 X 2  . Type II diabetes mellitus ([place])    Significant Hospital Events   7/22 admitted for AMS, CBG>1400, seizure activity, aspiration PNA, VDRF and shock requiring  pressors 7/23 off pressors 7/24 urology placed foley  7/25 reintubated due to worsening hypoxia 7/27 Afib with RVR for which started on amiodarone 8/3 transferred out of ICU  8/7 poor nutrition status, palliative care consulted 8/9 recurrent hypoglycemia, hypoxia 8/10 transferred to ICU for respiratory distress and AMS  Consults:  PCCM Urology   Procedures:  Lt IJ CVL 7/22>8/3 A line 7/25 > 8/3 ETT 7/25> 7/31 ETT 8/10>> Rt IJ CVL 8/10 >>  Significant Diagnostic Tests:   EEG 7/22 >> continuous slowing  CT head 7/22 >> atrophy, chronic small vessel ischemic changes, chronic thalamic lacunar infarcts  CT chest 7/22 >> multifocal pneumonia, changes of cirrhosis, small volume ascites, atherosclerosis  Echo 7/28 >> EF 60 to 65%, grade 1 DD  Micro Data:  Blood cx 7/22 > negative Urine cx 7/22> negative MRSA 7/22 negative Tracheal aspirate 7/29 > normal respiratory flora Blood cx 8/10 >>    Antimicrobials:  Vancomycin 7/22, 8/10 >  Cefepime 7/22 > 7/25, 8/10 >  Ceftriaxone 7/25> 7/28   Interim history/subjective:  This morning, patient noted to have altered mental status with acute respiratory failure for which he was transferred to ICU. Required intubation and mechanical ventilation. Also requiring pressor support for persistent hypotension.  Objective   Blood pressure (!) 71/46, pulse 97, temperature 97.9 F (36.6 C), resp. rate 20, height 5' 4"  (1.626 m), weight 61.9 kg, SpO2 (!) 60 %.        Intake/Output Summary (Last 24 hours) at 09/02/2019 1110 Last data filed at 09/02/2019 0658 Gross per 24 hour  Intake 874.54 ml  Output 600 ml  Net 274.54 ml   Filed Weights   08/31/19 0011 09/01/19 0157  09/02/19 0450  Weight: 59.8 kg 59.7 kg 61.9 kg    Examination: General: chronically ill appearing male, thin and frail, no acute distress, intubated HENT: [place]/AT, PERRL, ET tube in place Lungs: clear to auscultation bilaterally Cardiovascular: RRR, S1 and S2 present,  no m/r/g Abdomen: soft, nondistended, normoactive bowel sounds  Neuro: heavily sedated, PERRL, no apparent focal deficits  Skin: sacral decubitus ulcer with dressing in place; ulcer at tip of penis   Assessment & Plan:  Acute hypoxic respiratory failure suspected to be secondary to aspiration pneumonia:  Septic shock suspected secondary to aspiration pneumonia:  - Patient admitted with suspected recurrent aspiration pneumonia. Currently hypothermic and hypotensive.  - Mechanical ventilation with TV 6-8cc/kg - Vancomycin and cefepime per pharmacy dosing  - Fluid resuscitation 30cc/kg and pressor support for persistent hypotension  - VAP per protocol   Acute metabolic encephalopathy: Suspect this to be secondary to hypoxia vs sepsis vs medication with keppra, abilify and gabapentin  - Current RASS goal -2 to -3 while on vent  - Fentanyl gtt   Hx of transient atrial fibrillation with RVR Diastolic congestive heart failure  - Intermittently required amiodarone for A fib with RVR and lasix for hypervolemia  - Currently normal sinus rhythm and hypovolemic in setting of shock - Continue cardiac monitoring  Hypernatremia Hypokalemia - Likely in setting of acute illness - Monitor and replete prn  Hx of poorly controlled diabetes mellitus  Hypoglycemia  HbA1c 11.1; multiple episodes of hypoglycemia; likely secondary to poor PO intake and decreased glycogen stores - CBG monitoring q4h - tube feeds per dietician - Will hold off insulin at this time given multiple hypoglycemic episodes   Urinary retention secondary to phimosis - Previously had indwelling foley catheter which patient pulled out this morning while altered - Urology consulted for foley placement   Hx of seizure activity and bipolar disorder - Continue Keppra,  Abilify and gabapentin  Best practice:  Diet: tube feeds Pain/Anxiety/Delirium protocol (if indicated): per protocol VAP protocol (if indicated): per  protocol DVT prophylaxis: SCDs GI prophylaxis: Protonix Glucose control: Hypoglycemic Mobility: Bed rest Code Status: FULL  Family Communication: Patient's sister, Michael [name], updated on patient status 8/10 Disposition: ICU  Labs   CBC: Recent Labs  Lab 08/27/19 0434 08/31/19 0937  WBC 8.8 9.6  NEUTROABS 6.7 6.0  HGB 9.2* 10.5*  HCT 27.9* 32.1*  MCV 95.9 97.6  PLT 94* 63*    Basic Metabolic Panel: Recent Labs  Lab 08/28/19 0308 08/29/19 0734 08/31/19 0937 09/01/19 0549 09/02/19 0240  NA 142 147* 146* 149* 147*  K 4.0 4.0 4.2 3.3* 4.0  CL 105 107 105 111 109  CO2 24 30 29 27 29   GLUCOSE 214* 149* 251* 42* 68*  BUN 11 11 16 15 12   CREATININE 0.53* 0.50* 0.73 0.53* 0.46*  CALCIUM 7.8* 8.0* 7.7* 7.8* 7.8*  MG  --   --  1.5* 1.6* 2.0  PHOS  --   --  4.8* 3.8  --    GFR: Estimated Creatinine Clearance: 81.2 mL/min (A) (by C-G formula based on SCr of 0.46 mg/dL (L)). Recent Labs  Lab 08/27/19 0434 08/31/19 0937  WBC 8.8 9.6    Liver Function Tests: Recent Labs  Lab 08/31/19 0937 09/02/19 0240  AST  --  47*  ALT  --  46*  ALKPHOS  --  585*  BILITOT  --  0.8  PROT  --  4.8*  ALBUMIN 1.3* 1.4*   No results for input(s): LIPASE,  AMYLASE in the last 168 hours. No results for input(s): AMMONIA in the last 168 hours.  ABG    Component Value Date/Time   PHART 7.339 (L) 09/02/2019 0910   PCO2ART 49.4 (H) 09/02/2019 0910   PO2ART 62.0 (L) 09/02/2019 0910   HCO3 26.0 09/02/2019 0910   TCO2 32 08/22/2019 1122   ACIDBASEDEF 5.0 (H) 08/17/2019 0641   O2SAT 88.6 09/02/2019 0910     Coagulation Profile: No results for input(s): INR, PROTIME in the last 168 hours.  Cardiac Enzymes: No results for input(s): CKTOTAL, CKMB, CKMBINDEX, TROPONINI in the last 168 hours.  HbA1C: Hgb A1c MFr Bld  Date/Time Value Ref Range Status  09/02/2019 02:40 AM 11.1 (H) 4.8 - 5.6 % Final    Comment:    (NOTE) Pre diabetes:          5.7%-6.4%  Diabetes:               >6.4%  Glycemic control for   <7.0% adults with diabetes   08/15/2019 12:01 PM 15.3 (H) 4.8 - 5.6 % Final    Comment:    (NOTE)         Prediabetes: 5.7 - 6.4         Diabetes: >6.4         Glycemic control for adults with diabetes: <7.0     CBG: Recent Labs  Lab 09/02/19 0454 09/02/19 0647 09/02/19 0830 09/02/19 0955 09/02/19 1009  GLUCAP 122* 126* 91 34* 87    Review of Systems:   Unable to attain due to mental status  Past Medical History  He,  has a past medical history of Anxiety, Arthritis, Bipolar disorder ([place]), Daily headache, Diabetic peripheral neuropathy ([place]), GERD (gastroesophageal reflux disease), History of hepatitis B virus infection conferring immunity (03/22/2018), Pancreatitis, Schizophrenia ([place]), Seizures ([place]) (01/2016 X 2), and Type II diabetes mellitus ([place]).   Surgical History    Past Surgical History:  Procedure Laterality Date  . BALLOON DILATION  03/22/2011   Procedure: BALLOON DILATION;  Surgeon: Gatha Mayer, MD;  Location: [facility];  Service: Endoscopy;  Laterality: N/A;  . COLONOSCOPY N/A 05/22/2012   Procedure: COLONOSCOPY;  Surgeon: Gatha Mayer, MD;  Location: [facility];  Service: Endoscopy;  Laterality: N/A;  . COLONOSCOPY W/ BIOPSIES AND POLYPECTOMY  09/12/11  . ESOPHAGOGASTRODUODENOSCOPY  03/22/2011   Procedure: ESOPHAGOGASTRODUODENOSCOPY (EGD);  Surgeon: Gatha Mayer, MD;  Location: [facility];  Service: Endoscopy;  Laterality: N/A;  egd with balloon   . EUS  04/20/2011   Procedure: UPPER ENDOSCOPIC ULTRASOUND (EUS) LINEAR;  Surgeon: Milus Banister, MD;  Location: [facility];  Service: Endoscopy;  Laterality: N/A;  . KNEE ARTHROSCOPY Left      Social History   reports that he has been smoking cigarettes. He has a 21.00 pack-year smoking history. He has never used smokeless tobacco. He reports current alcohol use of about 2.0 standard drinks of alcohol per week. He reports previous drug use. Drug: Marijuana.    Family History   His family history includes Diabetes in his sister; Heart disease in his sister; Hypertension in his sister. There is no history of Colon cancer, Stomach cancer, Anesthesia problems, Hypotension, Pseudochol deficiency, or Malignant hyperthermia.   Allergies Allergies  Allergen Reactions  . Ace Inhibitors Swelling and Other (See Comments)    Angioedema - unquestionable  . Losartan Swelling and Other (See Comments)    Pt presented with unquestionable angioedema on ACEIs (Angiotensin-converting enzyme (ACE) inhibitors) so aviod ARBs (  Angiotensin receptor blockers), as well  . Tylenol [Acetaminophen] Other (See Comments)    "cannot have this" (takes Percocet at home)     Home Medications  Prior to Admission medications   Medication Sig Start Date End Date Taking? Authorizing Provider  albuterol (VENTOLIN HFA) 108 (90 Base) MCG/ACT inhaler Inhale 1 puff into the lungs every 4 (four) hours as needed for wheezing or shortness of breath.   Yes [provider]  ARIPiprazole (ABILIFY) 2 MG tablet Take 2 mg by mouth at bedtime.    Yes [provider]  BELBUCA 450 MCG FILM 450 mcg by Transmucosal route every 12 (twelve) hours. 07/14/19  Yes [provider]  DULoxetine (CYMBALTA) 30 MG capsule Take 30 mg by mouth daily.   Yes [provider]  feeding supplement, GLUCERNA SHAKE, (GLUCERNA SHAKE) LIQD Take 237 mLs by mouth 3 (three) times daily between meals. 03/30/17  Yes Emokpae, Courage, MD  folic acid (FOLVITE) 1 MG tablet Take 1 tablet (1 mg total) by mouth daily. 03/30/17  Yes Emokpae, Courage, MD  gabapentin (NEURONTIN) 600 MG tablet Take 600 mg by mouth 3 (three) times daily.  04/03/19  Yes [provider]  hydrOXYzine (ATARAX/VISTARIL) 25 MG tablet Take 25 mg by mouth 2 (two) times daily as needed for anxiety.    Yes [provider]  ibuprofen (ADVIL,MOTRIN) 800 MG tablet Take 800 mg by mouth 3 (three) times daily as needed for  mild pain.  09/17/17  Yes [provider]  insulin aspart (NOVOLOG) 100 UNIT/ML FlexPen Take 5 units before breakfast,   before lunch, and 5 units before dinner (5 units before each meal) Patient taking differently: Inject 10 Units into the skin 3 (three) times daily with meals.  03/30/17  Yes Emokpae, Courage, MD  LEVEMIR FLEXTOUCH 100 UNIT/ML Pen 4 units Twice a day Patient taking differently: Inject 16 Units into the skin 2 (two) times daily.  03/30/17  Yes Emokpae, Courage, MD  lipase/protease/amylase (CREON) 36000 UNITS CPEP capsule TAKE 1 CAPSULE BY MOUTH 3  TIMES DAILY BEFORE MEALS Patient taking differently: Take 36,000 Units by mouth 3 (three) times daily before meals.  01/05/17  Yes Rice, Jamesetta [place], MD  magnesium citrate SOLN Take 2.5 mLs by mouth daily as needed for mild constipation or moderate constipation.  05/13/19  Yes [provider]  naloxone (NARCAN) nasal spray 4 mg/0.1 mL Place 1 spray into the nose daily as needed (opioid reversal).  07/13/17  Yes [provider]  oxyCODONE-acetaminophen (PERCOCET) 10-325 MG tablet Take 1 tablet by mouth every 8 (eight) hours as needed for pain.   Yes [provider]  pantoprazole (PROTONIX) 40 MG tablet Take 1 tablet (40 mg total) by mouth daily. Patient taking differently: Take 40 mg by mouth daily as needed (GERD).  03/30/17  Yes Emokpae, Courage, MD  Vitamin D, Ergocalciferol, (DRISDOL) 1.25 MG (50000 UNIT) CAPS capsule Take 50,000 Units by mouth once a week. 03/20/19  Yes [provider]  ACCU-CHEK SOFTCLIX LANCETS lancets Use 3 times daily to check blood sugar. diag code E11.42. Insulin dependent 03/30/17   Shon Hale, MD  Alcohol Swabs (B-D SINGLE USE SWABS REGULAR) PADS Patient uses 6 times a day. Patient tests 3 times per day and administers insulin 3 times a day. ICD 10 code E11.42 03/30/17   Shon Hale, MD  Blood Glucose Calibration (ACCU-CHEK AVIVA) SOLN 1 drop by In Vitro route as  needed. diag code E08.10 insulin dependent. Use as device directs 03/30/17  Roxan Hockey, MD  Blood Glucose Monitoring Suppl (ACCU-CHEK AVIVA PLUS) w/Device KIT 1 kit by Does not apply route every morning. diag code E11.42. Insulin dependent 03/30/17   Emokpae, Courage, MD  glucose blood (ACCU-CHEK AVIVA PLUS) test strip Use 3 times daily to check blood sugar. diag code E11.42. Insulin dependent 03/30/17   Roxan Hockey, MD     Critical care time: 40 minutes     Harvie Heck, MD Internal Medicine, PGY-2 09/02/19 12:17 PM Pager # [phone]

## 2019-09-02 NOTE — Progress Notes (Signed)
SLP Cancellation Note  Patient Details Name: Michael [name] MRN: [id number] DOB: January 01, 1956   Cancelled treatment:       Reason Eval/Treat Not Completed: Patient not medically ready. Pt was re-intubated today and SLP orders have been cancelled by Dr. Halford Chessman. Please re-consult when clinically indicated.   Caryle Helgeson I. Hardin Negus, [place], [place] Office number [phone] Pager [place] 09/02/2019, 1:04 PM

## 2019-09-02 NOTE — Progress Notes (Signed)
Foley catheter was placed per Dr. Marva Panda and urologist order. Pt had very bloody urine output. Continue to monitor the patient and uo.

## 2019-09-02 NOTE — Consult Note (Signed)
Pharmacy Antibiotic Note  Michael [name] is a 64 y.o. male admitted on 08/18/2019 with sepsis.  Pharmacy has been consulted for vancomycin and cefepime dosing. Scr is stable at 0.46. Low temps, WBC WNL, and LA 3.2.    Plan: Cefepime 2g IV q8h Vancomycin loading dose 1250 mg IV x1, and then 750 mg IV q12h Monitor WBC, temperature curve, C/S, and Scr trend. Vanc levels as indicated. F/u de-escalation plan/LOT.   Height: 5\' 4"  (162.6 cm) Weight: 61.9 kg (136 lb 7.4 oz) IBW/kg (Calculated) : 59.2  Temp (24hrs), Avg:96.6 F (35.9 C), Min:94.4 F (34.7 C), Max:97.9 F (36.6 C)  Recent Labs  Lab 08/27/19 0434 08/27/19 0434 08/28/19 0308 08/29/19 0734 08/31/19 0937 09/01/19 0549 09/02/19 0240  WBC 8.8  --   --   --  9.6  --   --   CREATININE 0.59*   < > 0.53* 0.50* 0.73 0.53* 0.46*   < > = values in this interval not displayed.    Estimated Creatinine Clearance: 81.2 mL/min (A) (by C-G formula based on SCr of 0.46 mg/dL (L)).    Allergies  Allergen Reactions   Ace Inhibitors Swelling and Other (See Comments)    Angioedema - unquestionable   Losartan Swelling and Other (See Comments)    Pt presented with unquestionable angioedema on ACEIs (Angiotensin-converting enzyme (ACE) inhibitors) so aviod ARBs (Angiotensin receptor blockers), as well   Tylenol [Acetaminophen] Other (See Comments)    "cannot have this" (takes Percocet at home)    Antimicrobials this admission: Vancomycin 8/10 >>  Cefepime 8/10 >>   Dose adjustments this admission: N/A  Microbiology results: 7/22 MRSA PCR: Neg  Thank you for allowing pharmacy to be a part of this patients care.  Jacobo Forest PharmD Candidate 2022 09/02/2019 12:05 PM

## 2019-09-02 NOTE — Progress Notes (Addendum)
Nutrition Follow-up  DOCUMENTATION CODES:   Severe malnutrition in context of chronic illness  INTERVENTION:   Initiate tube feeding via OG tube: Vital AF 1.2 at 55 ml/h (1320 ml per day) Prosource TF 45 ml once daily  Provides 1624 kcal, 110 gm protein, 1071 ml free water daily  NUTRITION DIAGNOSIS:   Severe Malnutrition related to chronic illness (poorly controlled T2DM, pancreatitis) as evidenced by moderate fat depletion, moderate muscle depletion, severe muscle depletion, percent weight loss (10.4% weight loss in 4 months).  Ongoing   GOAL:   Patient will meet greater than or equal to 90% of their needs  Currently unmet, being addressed with TF   MONITOR:   Vent status, TF tolerance, Labs  REASON FOR ASSESSMENT:   Ventilator, Consult Enteral/tube feeding initiation and management  ASSESSMENT:   64 year old male who presented to the ED on 7/22 with respiratory distress after being found unresponsive on the floor by family. Pt had multiple seizures in the field. PMH of bipolar disorder, T2DM (poorly compliant with insulin regimen), GERD, pancreatitis, schizophrenia, seizures. Pt required intubation for airway protection.  Patient developed respiratory distress this morning and required intubation and transfer to the ICU. Received MD Consult for TF initiation and management. Patient previously had a Cortrak for tube feeding, but it was removed on 8/1 when patient started on a PO diet (D3-nectar). Intake of meals has been good for the past 2 days, meal intakes 50-100%.  Palliative care following and patient was made DNR with plans for palliative care to follow patient after discharge. Sister changed code status to full code when patient developed respiratory distress this morning.   Patient is currently intubated on ventilator support MV: 10.5 L/min Temp (24hrs), Avg:96.6 F (35.9 C), Min:94.4 F (34.7 C), Max:97.9 F (36.6 C)   Labs reviewed. Sodium 146, potassium  3 CBG: 43-38-128-100  Medications reviewed and include levophed, vasopressin, potassium chloride.  Currently 61.9 kg up from 56.7 kg on admission   Diet Order:   Diet Order            Diet NPO time specified  Diet effective now                 EDUCATION NEEDS:   No education needs have been identified at this time  Skin:  Skin Assessment: Skin Integrity Issues: Skin Integrity Issues:: Other (Comment), Stage II Stage II: sacrum Other: skin tear to bilateral arms  Last BM:  08/31/19  Height:   Ht Readings from Last 1 Encounters:  08/31/19 5\' 4"  (1.626 m)    Weight:   Wt Readings from Last 1 Encounters:  09/02/19 61.9 kg    Ideal Body Weight:  59.1 kg  BMI:  Body mass index is 23.42 kg/m.  Estimated Nutritional Needs:   Kcal:  1578  Protein:  105-120 grams  Fluid:  > 1.8 L    Lucas Mallow, RD, LDN, CNSC Please refer to Amion for contact information.

## 2019-09-02 NOTE — Progress Notes (Signed)
[facility] Progress Note Patient Name: Michael [name] DOB: [birth year] MRN: [id number]   Date of Service  09/02/2019  HPI/Events of Note  Hypotension - BP = 81/55 with MAP = 64. CVP = 6.   eICU Interventions  Plan: 1. Bolus with 0.9 NaCl 1 liter IV over 1 hour now.  2. Increase ceiling on Norepinephrine IV infusion to 60 mcg/min.  3. ABG STAT.      Intervention Category Major Interventions: Hypotension - evaluation and management  Ceazia Harb [place] 09/02/2019, 11:41 PM

## 2019-09-02 NOTE — Progress Notes (Signed)
Hypoglycemic Event  CBG: 69  Treatment: D50 50 mL (25 gm)  Symptoms: None  Follow-up CBG: Time:115 CBG Result: 8:44 PM   Possible Reasons for Event: Inadequate meal intake      Mardi Mainland

## 2019-09-02 NOTE — Progress Notes (Signed)
Rechecked patient's blood sugar after hypoglycemic intervention. It was 122. Covering provider was notified of patient's low blood sugar.

## 2019-09-02 NOTE — Significant Event (Signed)
Rapid Response Event Note   Reason for Call :  Pt with labored breathing and hypotensive after having been out of bed to [facility].  Initial Focused Assessment:  Upon my arrival he is lying in the bed with significant increased WOB and RR 36  O2 sat 96% on NRB He is able to arouse and answer a few orientation questions.   Lung sounds Rhonchi through out 1 22ga PIV Left Forearm Dr [place] Crane at bedside, spoke with family via phone.  Pt code status changed to Full Code  BP 53/26  HR 110  RR 36  O2 sat 95% on NRB  Interventions:  NT suctioned PCXR done ABG drawn Levophed started at 55mcg  BP remained 50s/30s  When I  Titrated gtt up to 20 mcg IV started to alarm occlusion.  Unable to infuse NS bolus for the same reason. Multiple attempts to start PIV unsuccessful. 2 IV team RNs at bedside.  Whitney NP with CCM at bedside.  Pt still able to answer a few questions.  RR 30s with increased WOB. Patient had large liquid BM, voiding urine/blood tinged. Turned patient for cleaning. After activity patient became less responsive and his respiratory pattern changed, became less effective. Dr Halford Chessman at bedside: Urgently transferred to 2M09 for intubation and ICU care  Plan of Care:     Event Summary:   MD Notified: Dr [place] Crane at bedside/CCM consulted and at bedside Call Time:  [id number] Arrival Time: 0845 End Time: [place]  Raliegh Ip, RN

## 2019-09-02 NOTE — Progress Notes (Signed)
Patient's 12am blood sugar was 68. He was given orange juice. When rechecked it was 102. Patient remained alert and oriented to person, place with no other signs or symptoms. Will continue to monitor for patient safety.

## 2019-09-03 ENCOUNTER — Inpatient Hospital Stay (HOSPITAL_COMMUNITY): Payer: Medicare HMO

## 2019-09-03 DIAGNOSIS — E872 Acidosis: Secondary | ICD-10-CM | POA: Diagnosis not present

## 2019-09-03 DIAGNOSIS — R6521 Severe sepsis with septic shock: Secondary | ICD-10-CM

## 2019-09-03 DIAGNOSIS — J9600 Acute respiratory failure, unspecified whether with hypoxia or hypercapnia: Secondary | ICD-10-CM | POA: Diagnosis not present

## 2019-09-03 DIAGNOSIS — B371 Pulmonary candidiasis: Secondary | ICD-10-CM

## 2019-09-03 DIAGNOSIS — J15211 Pneumonia due to Methicillin susceptible Staphylococcus aureus: Secondary | ICD-10-CM | POA: Diagnosis not present

## 2019-09-03 DIAGNOSIS — A419 Sepsis, unspecified organism: Secondary | ICD-10-CM | POA: Diagnosis not present

## 2019-09-03 LAB — CBC
HCT: 27 % — ABNORMAL LOW (ref 39.0–52.0)
Hemoglobin: 8.2 g/dL — ABNORMAL LOW (ref 13.0–17.0)
MCH: 32.8 pg (ref 26.0–34.0)
MCHC: 30.4 g/dL (ref 30.0–36.0)
MCV: 108 fL — ABNORMAL HIGH (ref 80.0–100.0)
Platelets: 20 10*3/uL — CL (ref 150–400)
RBC: 2.5 MIL/uL — ABNORMAL LOW (ref 4.22–5.81)
RDW: 20.9 % — ABNORMAL HIGH (ref 11.5–15.5)
WBC: 33 10*3/uL — ABNORMAL HIGH (ref 4.0–10.5)
nRBC: 1.2 % — ABNORMAL HIGH (ref 0.0–0.2)

## 2019-09-03 LAB — BLOOD CULTURE ID PANEL (REFLEXED) - BCID2
A.calcoaceticus-baumannii: NOT DETECTED
Bacteroides fragilis: NOT DETECTED
Candida albicans: DETECTED — AB
Candida auris: NOT DETECTED
Candida glabrata: NOT DETECTED
Candida krusei: NOT DETECTED
Candida parapsilosis: NOT DETECTED
Candida tropicalis: DETECTED — AB
Cryptococcus neoformans/gattii: NOT DETECTED
Enterobacter cloacae complex: NOT DETECTED
Enterobacterales: NOT DETECTED
Enterococcus Faecium: NOT DETECTED
Enterococcus faecalis: NOT DETECTED
Escherichia coli: NOT DETECTED
Haemophilus influenzae: NOT DETECTED
Klebsiella aerogenes: NOT DETECTED
Klebsiella oxytoca: NOT DETECTED
Klebsiella pneumoniae: NOT DETECTED
Listeria monocytogenes: NOT DETECTED
Neisseria meningitidis: NOT DETECTED
Proteus species: NOT DETECTED
Pseudomonas aeruginosa: NOT DETECTED
Salmonella species: NOT DETECTED
Serratia marcescens: NOT DETECTED
Staphylococcus aureus (BCID): NOT DETECTED
Staphylococcus epidermidis: NOT DETECTED
Staphylococcus lugdunensis: NOT DETECTED
Staphylococcus species: NOT DETECTED
Stenotrophomonas maltophilia: NOT DETECTED
Streptococcus agalactiae: NOT DETECTED
Streptococcus pneumoniae: NOT DETECTED
Streptococcus pyogenes: NOT DETECTED
Streptococcus species: NOT DETECTED

## 2019-09-03 LAB — POCT I-STAT EG7
Acid-base deficit: 10 mmol/L — ABNORMAL HIGH (ref 0.0–2.0)
Bicarbonate: 18.2 mmol/L — ABNORMAL LOW (ref 20.0–28.0)
Calcium, Ion: 1.06 mmol/L — ABNORMAL LOW (ref 1.15–1.40)
HCT: 25 % — ABNORMAL LOW (ref 39.0–52.0)
Hemoglobin: 8.5 g/dL — ABNORMAL LOW (ref 13.0–17.0)
O2 Saturation: 58 %
Patient temperature: 98
Potassium: 4.4 mmol/L (ref 3.5–5.1)
Sodium: 146 mmol/L — ABNORMAL HIGH (ref 135–145)
TCO2: 20 mmol/L — ABNORMAL LOW (ref 22–32)
pCO2, Ven: 49.4 mmHg (ref 44.0–60.0)
pH, Ven: 7.172 — CL (ref 7.250–7.430)
pO2, Ven: 37 mmHg (ref 32.0–45.0)

## 2019-09-03 LAB — GLUCOSE, CAPILLARY
Glucose-Capillary: 102 mg/dL — ABNORMAL HIGH (ref 70–99)
Glucose-Capillary: 102 mg/dL — ABNORMAL HIGH (ref 70–99)
Glucose-Capillary: 110 mg/dL — ABNORMAL HIGH (ref 70–99)
Glucose-Capillary: 121 mg/dL — ABNORMAL HIGH (ref 70–99)
Glucose-Capillary: 126 mg/dL — ABNORMAL HIGH (ref 70–99)
Glucose-Capillary: 137 mg/dL — ABNORMAL HIGH (ref 70–99)
Glucose-Capillary: 52 mg/dL — ABNORMAL LOW (ref 70–99)
Glucose-Capillary: 68 mg/dL — ABNORMAL LOW (ref 70–99)
Glucose-Capillary: 83 mg/dL (ref 70–99)
Glucose-Capillary: 86 mg/dL (ref 70–99)
Glucose-Capillary: <10 mg/dL — CL (ref 70–99)

## 2019-09-03 LAB — POCT I-STAT 7, (LYTES, BLD GAS, ICA,H+H)
Acid-base deficit: 10 mmol/L — ABNORMAL HIGH (ref 0.0–2.0)
Acid-base deficit: 10 mmol/L — ABNORMAL HIGH (ref 0.0–2.0)
Acid-base deficit: 11 mmol/L — ABNORMAL HIGH (ref 0.0–2.0)
Acid-base deficit: 12 mmol/L — ABNORMAL HIGH (ref 0.0–2.0)
Acid-base deficit: 12 mmol/L — ABNORMAL HIGH (ref 0.0–2.0)
Acid-base deficit: 8 mmol/L — ABNORMAL HIGH (ref 0.0–2.0)
Bicarbonate: 14.9 mmol/L — ABNORMAL LOW (ref 20.0–28.0)
Bicarbonate: 16.3 mmol/L — ABNORMAL LOW (ref 20.0–28.0)
Bicarbonate: 17.4 mmol/L — ABNORMAL LOW (ref 20.0–28.0)
Bicarbonate: 17.4 mmol/L — ABNORMAL LOW (ref 20.0–28.0)
Bicarbonate: 18.6 mmol/L — ABNORMAL LOW (ref 20.0–28.0)
Bicarbonate: 18.7 mmol/L — ABNORMAL LOW (ref 20.0–28.0)
Calcium, Ion: 1.02 mmol/L — ABNORMAL LOW (ref 1.15–1.40)
Calcium, Ion: 1.02 mmol/L — ABNORMAL LOW (ref 1.15–1.40)
Calcium, Ion: 1.04 mmol/L — ABNORMAL LOW (ref 1.15–1.40)
Calcium, Ion: 1.06 mmol/L — ABNORMAL LOW (ref 1.15–1.40)
Calcium, Ion: 1.07 mmol/L — ABNORMAL LOW (ref 1.15–1.40)
Calcium, Ion: 1.08 mmol/L — ABNORMAL LOW (ref 1.15–1.40)
HCT: 23 % — ABNORMAL LOW (ref 39.0–52.0)
HCT: 23 % — ABNORMAL LOW (ref 39.0–52.0)
HCT: 24 % — ABNORMAL LOW (ref 39.0–52.0)
HCT: 24 % — ABNORMAL LOW (ref 39.0–52.0)
HCT: 26 % — ABNORMAL LOW (ref 39.0–52.0)
HCT: 28 % — ABNORMAL LOW (ref 39.0–52.0)
Hemoglobin: 7.8 g/dL — ABNORMAL LOW (ref 13.0–17.0)
Hemoglobin: 7.8 g/dL — ABNORMAL LOW (ref 13.0–17.0)
Hemoglobin: 8.2 g/dL — ABNORMAL LOW (ref 13.0–17.0)
Hemoglobin: 8.2 g/dL — ABNORMAL LOW (ref 13.0–17.0)
Hemoglobin: 8.8 g/dL — ABNORMAL LOW (ref 13.0–17.0)
Hemoglobin: 9.5 g/dL — ABNORMAL LOW (ref 13.0–17.0)
O2 Saturation: 39 %
O2 Saturation: 72 %
O2 Saturation: 81 %
O2 Saturation: 86 %
O2 Saturation: 92 %
O2 Saturation: 95 %
Patient temperature: 97
Patient temperature: 97.4
Patient temperature: 97.4
Patient temperature: 97.6
Patient temperature: 98
Patient temperature: 98
Potassium: 4.4 mmol/L (ref 3.5–5.1)
Potassium: 4.4 mmol/L (ref 3.5–5.1)
Potassium: 4.4 mmol/L (ref 3.5–5.1)
Potassium: 4.5 mmol/L (ref 3.5–5.1)
Potassium: 4.9 mmol/L (ref 3.5–5.1)
Potassium: 5 mmol/L (ref 3.5–5.1)
Sodium: 142 mmol/L (ref 135–145)
Sodium: 144 mmol/L (ref 135–145)
Sodium: 145 mmol/L (ref 135–145)
Sodium: 145 mmol/L (ref 135–145)
Sodium: 145 mmol/L (ref 135–145)
Sodium: 146 mmol/L — ABNORMAL HIGH (ref 135–145)
TCO2: 16 mmol/L — ABNORMAL LOW (ref 22–32)
TCO2: 18 mmol/L — ABNORMAL LOW (ref 22–32)
TCO2: 19 mmol/L — ABNORMAL LOW (ref 22–32)
TCO2: 19 mmol/L — ABNORMAL LOW (ref 22–32)
TCO2: 20 mmol/L — ABNORMAL LOW (ref 22–32)
TCO2: 21 mmol/L — ABNORMAL LOW (ref 22–32)
pCO2 arterial: 36.1 mmHg (ref 32.0–48.0)
pCO2 arterial: 40.9 mmHg (ref 32.0–48.0)
pCO2 arterial: 42 mmHg (ref 32.0–48.0)
pCO2 arterial: 42.8 mmHg (ref 32.0–48.0)
pCO2 arterial: 44.3 mmHg (ref 32.0–48.0)
pCO2 arterial: 64.7 mmHg — ABNORMAL HIGH (ref 32.0–48.0)
pH, Arterial: 7.067 — CL (ref 7.350–7.450)
pH, Arterial: 7.182 — CL (ref 7.350–7.450)
pH, Arterial: 7.2 — ABNORMAL LOW (ref 7.350–7.450)
pH, Arterial: 7.219 — ABNORMAL LOW (ref 7.350–7.450)
pH, Arterial: 7.222 — ABNORMAL LOW (ref 7.350–7.450)
pH, Arterial: 7.263 — ABNORMAL LOW (ref 7.350–7.450)
pO2, Arterial: 31 mmHg — CL (ref 83.0–108.0)
pO2, Arterial: 43 mmHg — ABNORMAL LOW (ref 83.0–108.0)
pO2, Arterial: 49 mmHg — ABNORMAL LOW (ref 83.0–108.0)
pO2, Arterial: 61 mmHg — ABNORMAL LOW (ref 83.0–108.0)
pO2, Arterial: 75 mmHg — ABNORMAL LOW (ref 83.0–108.0)
pO2, Arterial: 86 mmHg (ref 83.0–108.0)

## 2019-09-03 LAB — MAGNESIUM
Magnesium: 2 mg/dL (ref 1.7–2.4)
Magnesium: 1.9 mg/dL (ref 1.7–2.4)

## 2019-09-03 LAB — HEPATITIS PANEL, ACUTE
HCV Ab: NONREACTIVE
Hep A IgM: NONREACTIVE
Hep B C IgM: REACTIVE — AB
Hepatitis B Surface Ag: NONREACTIVE

## 2019-09-03 LAB — COMPREHENSIVE METABOLIC PANEL
ALT: 37 U/L (ref 0–44)
AST: 51 U/L — ABNORMAL HIGH (ref 15–41)
Albumin: 1.3 g/dL — ABNORMAL LOW (ref 3.5–5.0)
Alkaline Phosphatase: 404 U/L — ABNORMAL HIGH (ref 38–126)
Anion gap: 14 (ref 5–15)
BUN: 15 mg/dL (ref 8–23)
CO2: 18 mmol/L — ABNORMAL LOW (ref 22–32)
Calcium: 6.8 mg/dL — ABNORMAL LOW (ref 8.9–10.3)
Chloride: 111 mmol/L (ref 98–111)
Creatinine, Ser: 1 mg/dL (ref 0.61–1.24)
GFR calc Af Amer: >60 mL/min (ref >60)
GFR calc non Af Amer: >60 mL/min (ref >60)
Glucose, Bld: 142 mg/dL — ABNORMAL HIGH (ref 70–99)
Potassium: 4.4 mmol/L (ref 3.5–5.1)
Sodium: 143 mmol/L (ref 135–145)
Total Bilirubin: 0.9 mg/dL (ref 0.3–1.2)
Total Protein: 3.9 g/dL — ABNORMAL LOW (ref 6.5–8.1)

## 2019-09-03 LAB — PHOSPHORUS
Phosphorus: 4 mg/dL (ref 2.5–4.6)
Phosphorus: 5.6 mg/dL — ABNORMAL HIGH (ref 2.5–4.6)

## 2019-09-03 LAB — HCV AB W REFLEX TO QUANT PCR: HCV Ab: 0.3 s/co ratio (ref 0.0–0.9)

## 2019-09-03 LAB — COOXEMETRY PANEL
Carboxyhemoglobin: 1.6 % — ABNORMAL HIGH (ref 0.5–1.5)
Methemoglobin: 0.5 % (ref 0.0–1.5)
O2 Saturation: 99.4 %
Total hemoglobin: 8 g/dL — ABNORMAL LOW (ref 12.0–16.0)

## 2019-09-03 LAB — VANCOMYCIN, RANDOM: Vancomycin Rm: 19

## 2019-09-03 LAB — HCV INTERPRETATION

## 2019-09-03 LAB — LACTIC ACID, PLASMA: Lactic Acid, Venous: 8.5 mmol/L (ref 0.5–1.9)

## 2019-09-03 MED ORDER — SODIUM BICARBONATE 8.4 % IV SOLN
100.0000 meq | Freq: Once | INTRAVENOUS | Status: AC
  Administered 2019-09-03: 100 meq via INTRAVENOUS
  Filled 2019-09-03: qty 100

## 2019-09-03 MED ORDER — VANCOMYCIN VARIABLE DOSE PER UNSTABLE RENAL FUNCTION (PHARMACIST DOSING): Status: DC

## 2019-09-03 MED ORDER — SODIUM BICARBONATE 8.4 % IV SOLN
INTRAVENOUS | Status: AC
  Filled 2019-09-03: qty 50

## 2019-09-03 MED ORDER — SODIUM CHLORIDE 0.9 % IV SOLN
200.0000 mg | Freq: Once | INTRAVENOUS | Status: AC
  Administered 2019-09-03: 200 mg via INTRAVENOUS
  Filled 2019-09-03: qty 200

## 2019-09-03 MED ORDER — ALBUMIN HUMAN 25 % IV SOLN
12.5000 g | Freq: Once | INTRAVENOUS | Status: AC
  Administered 2019-09-03: 12.5 g via INTRAVENOUS
  Filled 2019-09-03: qty 50

## 2019-09-03 MED ORDER — LACTATED RINGERS IV BOLUS
1000.0000 mL | Freq: Once | INTRAVENOUS | Status: AC
  Administered 2019-09-03: 1000 mL via INTRAVENOUS

## 2019-09-03 MED ORDER — DEXTROSE 50 % IV SOLN
50.0000 mL | Freq: Once | INTRAVENOUS | Status: AC
  Administered 2019-09-03: 50 mL via INTRAVENOUS

## 2019-09-03 MED ORDER — PHENYLEPHRINE HCL-NACL 10-0.9 MG/250ML-% IV SOLN
INTRAVENOUS | Status: AC
  Filled 2019-09-03: qty 250

## 2019-09-03 MED ORDER — SODIUM CHLORIDE 0.9 % IV SOLN
100.0000 mg | INTRAVENOUS | Status: AC
  Administered 2019-09-04 – 2019-09-05 (×2): 100 mg via INTRAVENOUS
  Filled 2019-09-03 (×2): qty 100

## 2019-09-03 MED ORDER — VANCOMYCIN HCL IN DEXTROSE 1-5 GM/200ML-% IV SOLN
1000.0000 mg | Freq: Once | INTRAVENOUS | Status: AC
  Administered 2019-09-03: 1000 mg via INTRAVENOUS
  Filled 2019-09-03: qty 200

## 2019-09-03 MED ORDER — STERILE WATER FOR INJECTION IV SOLN
INTRAVENOUS | Status: DC
  Filled 2019-09-03 (×10): qty 850

## 2019-09-03 MED ORDER — POLYETHYLENE GLYCOL 3350 17 G PO PACK: 17.0000 g | PACK | Freq: Every day | ORAL | Status: DC | PRN

## 2019-09-03 NOTE — Consult Note (Signed)
[facility] for Infectious Disease  Total days of antibiotics 19               Reason for Consult: candidemia    Referring Physician: sood  Active Problems:   AMS (altered mental status)   Shock circulatory ([place])   Acute respiratory failure requiring reintubation ([place])   Palliative care by specialist   Goals of care, counseling/discussion   DNR (do not resuscitate)   FTT (failure to thrive) in adult   Severe protein-calorie malnutrition ([place])   Muscular deconditioning   Pressure injury of skin   Septic shock ([place])    HPI: Michael [name] is a 64 y.o. male who was admitted on 7/22 for DKA, and seizure, which led to be intubated and subsequent extubation. However had increasing work of breathing and hypoxia on 8/10 requiring reintubation also found to be hypothermic, hypotensive requiring numerous pressors. Possibly had acute aspiration. 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 This afternoon he is on maximal pressor support and 100%FIO2 on ventilator as well as amiodarone for afib. WBC up to 33 from 9. Labs show LA of 8. He was started on vancomycin/cefepime initially/chest X-ray per my read has bilateral patchy infiltrate in upper lung fields  Past Medical History:  Diagnosis Date  . Anxiety   . Arthritis    "joints; shoulders; feet" (06/08/2016)  . Bipolar disorder ([place])   . Daily headache    "7:30 - 8:00 q night" (06/08/2016)  . Diabetic peripheral neuropathy ([place])   . GERD (gastroesophageal reflux disease)   . History of hepatitis B virus infection conferring immunity 03/22/2018  . Pancreatitis   . Schizophrenia ([place])   . Seizures ([place]) 01/2016 X 2  . Type II diabetes mellitus (H[facility])     Allergies:  Allergies  Allergen Reactions  . Ace Inhibitors Swelling and Other (See Comments)    Angioedema - unquestionable  . Losartan Swelling and Other (See Comments)    Pt presented with unquestionable angioedema on ACEIs  (Angiotensin-converting enzyme (ACE) inhibitors) so aviod ARBs (Angiotensin receptor blockers), as well  . Tylenol [Acetaminophen] Other (See Comments)    "cannot have this" (takes Percocet at home)    MEDICATIONS: . chlorhexidine gluconate (MEDLINE KIT)  15 mL Mouth Rinse BID  . Chlorhexidine Gluconate Cloth  6 each Topical Q0600  . feeding supplement (PROSource TF)  45 mL Per Tube Daily  . ipratropium-albuterol  3 mL Nebulization Q6H  . lipase/protease/amylase  36,000 Units Oral TID AC  . mouth rinse  15 mL Mouth Rinse 10 times per day  . multivitamin with minerals  1 tablet Per Tube Daily  . nicotine  7 mg Transdermal Daily  . pantoprazole (PROTONIX) IV  40 mg Intravenous Q24H  . sodium chloride flush  10-40 mL Intracatheter Q12H  . vancomycin variable dose per unstable renal function (pharmacist dosing)   Does not apply See admin instructions    Social History   Tobacco Use  . Smoking status: Current Every Day Smoker    Packs/day: 0.50    Years: 42.00    Pack years: 21.00    Types: Cigarettes  . Smokeless tobacco: Never Used  . Tobacco comment: Sometimes less.  Vaping Use  . Vaping Use: Never used  Substance Use Topics  . Alcohol use: Yes    Alcohol/week: 2.0 standard drinks    Types: 2 Cans of beer per week  . Drug use: Not Currently  Types: Marijuana    Comment: Sober for 2 years    Family History  Problem Relation Age of Onset  . Hypertension Sister   . Diabetes Sister   . Heart disease Sister   . Colon cancer Neg Hx   . Stomach cancer Neg Hx   . Anesthesia problems Neg Hx   . Hypotension Neg Hx   . Pseudochol deficiency Neg Hx   . Malignant hyperthermia Neg Hx     Review of Systems - Unable to obtain since patient is intubated and sedated  OBJECTIVE: Temp:  [94.2 F (34.6 C)-98.3 F (36.8 C)] 97.5 F (36.4 C) (08/11 1604) Pulse Rate:  [28-119] 36 (08/11 1800) Resp:  [0-39] 35 (08/11 1800) BP: (74-147)/(29-129) 92/77 (08/11 1800) SpO2:  [5  %-100 %] 73 % (08/11 1800) Arterial Line BP: (77-302)/(45-59) 302/58 (08/11 1800) FiO2 (%):  [50 %-100 %] 100 % (08/11 1604) Weight:  [72 kg] 72 kg (08/11 0457) Physical Exam  Constitutional: He is sedated. He appears well-developed and well-nourished. No distress.  HENT:  Mouth/Throat: OETT in place Cardiovascular: Normal rate, regular rhythm and normal heart sounds. Exam reveals no gallop and no friction rub.  No murmur heard.  Pulmonary/Chest: Effort normal and breath sounds normal. Bilateral rhonchi Abdominal: Soft. Bowel sounds are normal. He exhibits no distension. There is no tenderness.  Ext: pitting edema to lower extremities Neurological: He is alert and oriented to person, place, and time.  Skin: Skin is warm and dry. No rash noted. No erythema.  Psychiatric: He has a normal mood and affect. His behavior is normal.     LABS: Results for orders placed or performed during the hospital encounter of 08/03/2019 (from the past 48 hour(s))  Glucose, capillary     Status: Abnormal   Collection Time: 09/01/19  8:09 PM  Result Value Ref Range   Glucose-Capillary 140 (H) 70 - 99 mg/dL    Comment: Glucose reference range applies only to samples taken after fasting for at least 8 hours.  Glucose, capillary     Status: Abnormal   Collection Time: 09/02/19 12:10 AM  Result Value Ref Range   Glucose-Capillary 68 (L) 70 - 99 mg/dL    Comment: Glucose reference range applies only to samples taken after fasting for at least 8 hours.   Comment 1 Notify RN    Comment 2 Document in Chart   Glucose, capillary     Status: Abnormal   Collection Time: 09/02/19  1:01 AM  Result Value Ref Range   Glucose-Capillary 102 (H) 70 - 99 mg/dL    Comment: Glucose reference range applies only to samples taken after fasting for at least 8 hours.  Comprehensive metabolic panel     Status: Abnormal   Collection Time: 09/02/19  2:40 AM  Result Value Ref Range   Sodium 147 (H) 135 - 145 mmol/L   Potassium  4.0 3.5 - 5.1 mmol/L    Comment: DELTA CHECK NOTED   Chloride 109 98 - 111 mmol/L   CO2 29 22 - 32 mmol/L   Glucose, Bld 68 (L) 70 - 99 mg/dL    Comment: Glucose reference range applies only to samples taken after fasting for at least 8 hours.   BUN 12 8 - 23 mg/dL   Creatinine, Ser 0.46 (L) 0.61 - 1.24 mg/dL   Calcium 7.8 (L) 8.9 - 10.3 mg/dL   Total Protein 4.8 (L) 6.5 - 8.1 g/dL   Albumin 1.4 (L) 3.5 - 5.0 g/dL  AST 47 (H) 15 - 41 U/L   ALT 46 (H) 0 - 44 U/L   Alkaline Phosphatase 585 (H) 38 - 126 U/L   Total Bilirubin 0.8 0.3 - 1.2 mg/dL   GFR calc non Af Amer >60 >60 mL/min   GFR calc Af Amer >60 >60 mL/min   Anion gap 9 5 - 15    Comment: Performed at [facility] [address], [place], [place] [address]  Magnesium     Status: None   Collection Time: 09/02/19  2:40 AM  Result Value Ref Range   Magnesium 2.0 1.7 - 2.4 mg/dL    Comment: Performed at [facility] [address], [place], [place] [address]  Hemoglobin A1c     Status: Abnormal   Collection Time: 09/02/19  2:40 AM  Result Value Ref Range   Hgb A1c MFr Bld 11.1 (H) 4.8 - 5.6 %    Comment: (NOTE) Pre diabetes:          5.7%-6.4%  Diabetes:              >6.4%  Glycemic control for   <7.0% adults with diabetes    Mean Plasma Glucose 271.87 mg/dL    Comment: Performed at [facility] [address] [place],  [address]  Hepatitis panel, acute     Status: Abnormal   Collection Time: 09/02/19  2:40 AM  Result Value Ref Range   Hepatitis B Surface Ag NON REACTIVE NON REACTIVE   HCV Ab NON REACTIVE NON REACTIVE    Comment: (NOTE) Nonreactive HCV antibody screen is consistent with no HCV infections,  unless recent infection is suspected or other evidence exists to indicate HCV infection.     Hep A IgM NON REACTIVE NON REACTIVE   Hep B C IgM Reactive (A) NON REACTIVE    Comment: HEALTH DEPARTMENT NOTIFIED 09/03/19 Performed at [facility], [place] [address],  [place], [place] [address]   Glucose, capillary     Status: Abnormal   Collection Time: 09/02/19  4:00 AM  Result Value Ref Range   Glucose-Capillary 45 (L) 70 - 99 mg/dL    Comment: Glucose reference range applies only to samples taken after fasting for at least 8 hours.  Glucose, capillary     Status: Abnormal   Collection Time: 09/02/19  4:54 AM  Result Value Ref Range   Glucose-Capillary 122 (H) 70 - 99 mg/dL    Comment: Glucose reference range applies only to samples taken after fasting for at least 8 hours.  Glucose, capillary     Status: Abnormal   Collection Time: 09/02/19  6:47 AM  Result Value Ref Range   Glucose-Capillary 126 (H) 70 - 99 mg/dL    Comment: Glucose reference range applies only to samples taken after fasting for at least 8 hours.  Glucose, capillary     Status: None   Collection Time: 09/02/19  8:30 AM  Result Value Ref Range   Glucose-Capillary 91 70 - 99 mg/dL    Comment: Glucose reference range applies only to samples taken after fasting for at least 8 hours.  Blood gas, arterial     Status: Abnormal   Collection Time: 09/02/19  9:10 AM  Result Value Ref Range   FIO2 100.00    pH, Arterial 7.339 (L) 7.35 - 7.45   pCO2 arterial 49.4 (H) 32 - 48 mmHg   pO2, Arterial 62.0 (L) 83 - 108 mmHg   Bicarbonate  26.0 20.0 - 28.0 mmol/L   Acid-Base Excess 0.8 0.0 - 2.0 mmol/L   O2 Saturation 88.6 %   Patient temperature 36.6    Collection site RIGHT BRACHIAL    Drawn by [id number]     Comment: COLLECTED BY RT   Sample type ARTERIAL DRAW    Allens test (pass/fail) BRACHIAL ARTERY (A) PASS    Comment: Performed at [facility], [place] [address] [place], [place] [address]  Glucose, capillary     Status: Abnormal   Collection Time: 09/02/19  9:55 AM  Result Value Ref Range   Glucose-Capillary 34 (LL) 70 - 99 mg/dL    Comment: Glucose reference range applies only to samples taken after fasting for at least 8 hours.  Glucose, capillary     Status: None   Collection  Time: 09/02/19 10:09 AM  Result Value Ref Range   Glucose-Capillary 87 70 - 99 mg/dL    Comment: Glucose reference range applies only to samples taken after fasting for at least 8 hours.  Lactic acid, plasma     Status: Abnormal   Collection Time: 09/02/19 10:44 AM  Result Value Ref Range   Lactic Acid, Venous 3.2 (HH) 0.5 - 1.9 mmol/L    Comment: CRITICAL RESULT CALLED TO, READ BACK BY AND VERIFIED WITH: H MCCOULD,RN 09/02/2019 [address] Performed at [facility], [place] [address], [place], [place] [address]   Lipase, blood     Status: None   Collection Time: 09/02/19 10:50 AM  Result Value Ref Range   Lipase 14 11 - 51 U/L    Comment: Performed at [facility] [address] [place], [place] [address]  Amylase     Status: None   Collection Time: 09/02/19 10:50 AM  Result Value Ref Range   Amylase 83 28 - 100 U/L    Comment: Performed at [facility] [address] [place], [place] [address]  CBC with Differential/Platelet     Status: None   Collection Time: 09/02/19 11:06 AM  Result Value Ref Range   WBC  4.0 - 10.5 K/uL    QUESTIONABLE RESULTS, RECOMMEND RECOLLECT TO VERIFY    Comment: CORRECTED ON 08/10 AT 1402: PREVIOUSLY REPORTED AS 1.8   RBC  4.22 - 5.81 MIL/uL    QUESTIONABLE RESULTS, RECOMMEND RECOLLECT TO VERIFY    Comment: CORRECTED ON 08/10 AT 1402: PREVIOUSLY REPORTED AS 2.82   Hemoglobin  13.0 - 17.0 g/dL    QUESTIONABLE RESULTS, RECOMMEND RECOLLECT TO VERIFY    Comment: CORRECTED ON 08/10 AT 1402: PREVIOUSLY REPORTED AS 9.0   HCT  39 - 52 %    QUESTIONABLE RESULTS, RECOMMEND RECOLLECT TO VERIFY    Comment: CORRECTED ON 08/10 AT 1402: PREVIOUSLY REPORTED AS 29.3   MCV  80.0 - 100.0 fL    QUESTIONABLE RESULTS, RECOMMEND RECOLLECT TO VERIFY    Comment: CORRECTED ON 08/10 AT 1402: PREVIOUSLY REPORTED AS 103.9   MCH  26.0 - 34.0 pg    QUESTIONABLE RESULTS, RECOMMEND RECOLLECT TO VERIFY    Comment: CORRECTED ON 08/10 AT 1402: PREVIOUSLY  REPORTED AS 31.9   MCHC  30.0 - 36.0 g/dL    QUESTIONABLE RESULTS, RECOMMEND RECOLLECT TO VERIFY    Comment: CORRECTED ON 08/10 AT 1402: PREVIOUSLY REPORTED AS 30.7   RDW  11.5 - 15.5 %    QUESTIONABLE RESULTS, RECOMMEND RECOLLECT TO VERIFY    Comment: CORRECTED ON 08/10 AT 1402: PREVIOUSLY REPORTED AS 19.9  Platelets  150 - 400 K/uL    QUESTIONABLE RESULTS, RECOMMEND RECOLLECT TO VERIFY    Comment: CORRECTED ON 08/10 AT 1402: PREVIOUSLY REPORTED AS 28 REPEATED TO VERIFY Immature Platelet Fraction may be clinically indicated, consider ordering this additional test [id number] CONSISTENT WITH PREVIOUS RESULT THIS CRITICAL RESULT HAS VERIFIED AND BEEN  CALLED TO H MCLOUD RN BY KIRSTENE FORSYTH ON 08 10 2021 AT 84, AND HAS BEEN READ BACK.     nRBC  0.0 - 0.2 %    QUESTIONABLE RESULTS, RECOMMEND RECOLLECT TO VERIFY    Comment: Performed at [facility] [address], [place], [place] [address] CORRECTED ON 08/10 AT 1402: PREVIOUSLY REPORTED AS 10.7    Neutrophils Relative %  %    QUESTIONABLE RESULTS, RECOMMEND RECOLLECT TO VERIFY   Neutro Abs  1.7 - 7.7 K/uL    QUESTIONABLE RESULTS, RECOMMEND RECOLLECT TO VERIFY   Band Neutrophils  %    QUESTIONABLE RESULTS, RECOMMEND RECOLLECT TO VERIFY   Lymphocytes Relative  %    QUESTIONABLE RESULTS, RECOMMEND RECOLLECT TO VERIFY   Lymphs Abs  0.7 - 4.0 K/uL    QUESTIONABLE RESULTS, RECOMMEND RECOLLECT TO VERIFY   Monocytes Relative  %    QUESTIONABLE RESULTS, RECOMMEND RECOLLECT TO VERIFY   Monocytes Absolute  0 - 1 K/uL    QUESTIONABLE RESULTS, RECOMMEND RECOLLECT TO VERIFY   Eosinophils Relative  %    QUESTIONABLE RESULTS, RECOMMEND RECOLLECT TO VERIFY   Eosinophils Absolute  0 - 0 K/uL    QUESTIONABLE RESULTS, RECOMMEND RECOLLECT TO VERIFY   Basophils Relative  %    QUESTIONABLE RESULTS, RECOMMEND RECOLLECT TO VERIFY   Basophils Absolute  0 - 0 K/uL    QUESTIONABLE RESULTS, RECOMMEND RECOLLECT TO VERIFY   WBC Morphology       QUESTIONABLE RESULTS, RECOMMEND RECOLLECT TO VERIFY   RBC Morphology      QUESTIONABLE RESULTS, RECOMMEND RECOLLECT TO VERIFY   Smear Review      QUESTIONABLE RESULTS, RECOMMEND RECOLLECT TO VERIFY   Other  %    QUESTIONABLE RESULTS, RECOMMEND RECOLLECT TO VERIFY   nRBC  0 /100 WBC    QUESTIONABLE RESULTS, RECOMMEND RECOLLECT TO VERIFY   Metamyelocytes Relative  %    QUESTIONABLE RESULTS, RECOMMEND RECOLLECT TO VERIFY   Myelocytes  %    QUESTIONABLE RESULTS, RECOMMEND RECOLLECT TO VERIFY   Promyelocytes Relative  %    QUESTIONABLE RESULTS, RECOMMEND RECOLLECT TO VERIFY   Blasts  %    QUESTIONABLE RESULTS, RECOMMEND RECOLLECT TO VERIFY   Immature Granulocytes  %    QUESTIONABLE RESULTS, RECOMMEND RECOLLECT TO VERIFY   Abs Immature Granulocytes  0.00 - 0.07 K/uL    QUESTIONABLE RESULTS, RECOMMEND RECOLLECT TO VERIFY  Glucose, capillary     Status: Abnormal   Collection Time: 09/02/19 11:18 AM  Result Value Ref Range   Glucose-Capillary 43 (LL) 70 - 99 mg/dL    Comment: Glucose reference range applies only to samples taken after fasting for at least 8 hours.   Comment 1 Notify RN   Glucose, capillary     Status: Abnormal   Collection Time: 09/02/19 11:43 AM  Result Value Ref Range   Glucose-Capillary 38 (LL) 70 - 99 mg/dL    Comment: Glucose reference range applies only to samples taken after fasting for at least 8 hours.   Comment 1 Notify RN   I-STAT 7, (LYTES, BLD GAS, ICA, H+H)     Status: Abnormal   Collection Time:  09/02/19 11:57 AM  Result Value Ref Range   pH, Arterial 7.312 (L) 7.35 - 7.45   pCO2 arterial 50.1 (H) 32 - 48 mmHg   pO2, Arterial 202 (H) 83 - 108 mmHg   Bicarbonate 26.1 20.0 - 28.0 mmol/L   TCO2 28 22 - 32 mmol/L   O2 Saturation 100.0 %   Acid-base deficit 1.0 0.0 - 2.0 mmol/L   Sodium 146 (H) 135 - 145 mmol/L   Potassium 3.0 (L) 3.5 - 5.1 mmol/L   Calcium, Ion 1.15 1.15 - 1.40 mmol/L   HCT 22.0 (L) 39 - 52 %   Hemoglobin 7.5 (L) 13.0 - 17.0 g/dL    Patient temperature 94.4 F    Collection site Radial    Drawn by RT    Sample type ARTERIAL   Culture, blood (Routine X 2) w Reflex to ID Panel     Status: None (Preliminary result)   Collection Time: 09/02/19 12:05 PM   Specimen: BLOOD RIGHT HAND  Result Value Ref Range   Specimen Description BLOOD RIGHT HAND    Special Requests      BOTTLES DRAWN AEROBIC ONLY Blood Culture adequate volume   Culture  Setup Time      YEAST AEROBIC BOTTLE ONLY Organism ID to follow CRITICAL RESULT CALLED TO, READ BACK BY AND VERIFIED WITH: Andres Shad PharmD 9:35 09/03/19 (wilsonm) Performed at [facility], [address] [place], [place] [address]    Culture YEAST    Report Status PENDING   Glucose, capillary     Status: Abnormal   Collection Time: 09/02/19 12:05 PM  Result Value Ref Range   Glucose-Capillary 128 (H) 70 - 99 mg/dL    Comment: Glucose reference range applies only to samples taken after fasting for at least 8 hours.  Blood Culture ID Panel (Reflexed)     Status: Abnormal   Collection Time: 09/02/19 12:05 PM  Result Value Ref Range   Enterococcus faecalis NOT DETECTED NOT DETECTED   Enterococcus Faecium NOT DETECTED NOT DETECTED   Listeria monocytogenes NOT DETECTED NOT DETECTED   Staphylococcus species NOT DETECTED NOT DETECTED   Staphylococcus aureus (BCID) NOT DETECTED NOT DETECTED   Staphylococcus epidermidis NOT DETECTED NOT DETECTED   Staphylococcus lugdunensis NOT DETECTED NOT DETECTED   Streptococcus species NOT DETECTED NOT DETECTED   Streptococcus agalactiae NOT DETECTED NOT DETECTED   Streptococcus pneumoniae NOT DETECTED NOT DETECTED   Streptococcus pyogenes NOT DETECTED NOT DETECTED   A.calcoaceticus-baumannii NOT DETECTED NOT DETECTED   Bacteroides fragilis NOT DETECTED NOT DETECTED   Enterobacterales NOT DETECTED NOT DETECTED   Enterobacter cloacae complex NOT DETECTED NOT DETECTED   Escherichia coli NOT DETECTED NOT DETECTED   Klebsiella aerogenes NOT  DETECTED NOT DETECTED   Klebsiella oxytoca NOT DETECTED NOT DETECTED   Klebsiella pneumoniae NOT DETECTED NOT DETECTED   Proteus species NOT DETECTED NOT DETECTED   Salmonella species NOT DETECTED NOT DETECTED   Serratia marcescens NOT DETECTED NOT DETECTED   Haemophilus influenzae NOT DETECTED NOT DETECTED   Neisseria meningitidis NOT DETECTED NOT DETECTED   Pseudomonas aeruginosa NOT DETECTED NOT DETECTED   Stenotrophomonas maltophilia NOT DETECTED NOT DETECTED   Candida albicans DETECTED (A) NOT DETECTED    Comment: CRITICAL RESULT CALLED TO, READ BACK BY AND VERIFIED WITH: Andres Shad PharmD 9:35 09/03/19 (wilsonm)    Candida auris NOT DETECTED NOT DETECTED   Candida glabrata NOT DETECTED NOT DETECTED   Candida krusei NOT DETECTED NOT DETECTED   Candida parapsilosis  NOT DETECTED NOT DETECTED   Candida tropicalis DETECTED (A) NOT DETECTED    Comment: CRITICAL RESULT CALLED TO, READ BACK BY AND VERIFIED WITH: Andres Shad PharmD 9:35 09/03/19 (wilsonm)    Cryptococcus neoformans/gattii NOT DETECTED NOT DETECTED    Comment: Performed at [facility], [place] [address], [place], [place] [address]  Culture, blood (Routine X 2) w Reflex to ID Panel     Status: Abnormal (Preliminary result)   Collection Time: 09/02/19 12:11 PM   Specimen: BLOOD RIGHT HAND  Result Value Ref Range   Specimen Description BLOOD RIGHT HAND    Special Requests      BOTTLES DRAWN AEROBIC ONLY Blood Culture adequate volume   Culture  Setup Time (A)     YEAST AEROBIC BOTTLE ONLY CRITICAL VALUE NOTED.  VALUE IS CONSISTENT WITH PREVIOUSLY REPORTED AND CALLED VALUE. Performed at [facility], [place] [address], [place], [place] [address]    Culture YEAST    Report Status PENDING   Culture, respiratory (non-expectorated)     Status: None (Preliminary result)   Collection Time: 09/02/19 12:34 PM   Specimen: Endotracheal; Respiratory  Result Value Ref Range   Specimen Description ENDOTRACHEAL    Special  Requests NONE    Gram Stain      RARE WBC SEEN FEW SQUAMOUS EPITHELIAL CELLS PRESENT ABUNDANT GRAM POSITIVE COCCI IN PAIRS ABUNDANT GRAM POSITIVE COCCI IN CLUSTERS RARE GRAM NEGATIVE RODS RARE GRAM POSITIVE RODS    Culture      MODERATE STAPHYLOCOCCUS AUREUS RARE GRAM NEGATIVE RODS SUSCEPTIBILITIES TO FOLLOW Performed at [facility], [place] [address] [place], [place] [address]    Report Status PENDING   Glucose, capillary     Status: Abnormal   Collection Time: 09/02/19  1:50 PM  Result Value Ref Range   Glucose-Capillary 100 (H) 70 - 99 mg/dL    Comment: Glucose reference range applies only to samples taken after fasting for at least 8 hours.  CBC     Status: Abnormal   Collection Time: 09/02/19  2:03 PM  Result Value Ref Range   WBC 7.5 4.0 - 10.5 K/uL   RBC 3.02 (L) 4.22 - 5.81 MIL/uL   Hemoglobin 9.8 (L) 13.0 - 17.0 g/dL   HCT 31.5 (L) 39 - 52 %   MCV 104.3 (H) 80.0 - 100.0 fL    Comment: NEW SAMPLE   MCH 32.5 26.0 - 34.0 pg   MCHC 31.1 30.0 - 36.0 g/dL   RDW 19.9 (H) 11.5 - 15.5 %   Platelets 25 (LL) 150 - 400 K/uL    Comment: REPEATED TO VERIFY PLATELET COUNT CONFIRMED BY SMEAR CRITICAL RESULT CALLED TO, READ BACK BY AND VERIFIED WITH: H.MCLEOD RN 1455 09/02/19 MCCORMICK K    nRBC 2.5 (H) 0.0 - 0.2 %    Comment: Performed at [facility], [place] [address], [place], [place] [address]  Lactic acid, plasma     Status: Abnormal   Collection Time: 09/02/19  2:05 PM  Result Value Ref Range   Lactic Acid, Venous 3.7 (HH) 0.5 - 1.9 mmol/L    Comment: CRITICAL VALUE NOTED.  VALUE IS CONSISTENT WITH PREVIOUSLY REPORTED AND CALLED VALUE. Performed at [facility], [place] [address] [place], [place] [address]   Magnesium     Status: None   Collection Time: 09/02/19  3:40 PM  Result Value Ref Range   Magnesium 1.7 1.7 - 2.4 mg/dL    Comment: Performed  at [facility], [place] [address] [place], [place] [address]  Phosphorus     Status: None    Collection Time: 09/02/19  3:40 PM  Result Value Ref Range   Phosphorus 3.2 2.5 - 4.6 mg/dL    Comment: Performed at [facility] [address] [place], [place] [address]  HIV Antibody (routine testing w rflx)     Status: None   Collection Time: 09/02/19  3:40 PM  Result Value Ref Range   HIV Screen 4th Generation wRfx Non Reactive Non Reactive    Comment: Performed at [facility], [place] [address], [place], [place] [address]  HCV Ab Reflex to Quant PCR     Status: None   Collection Time: 09/02/19  3:40 PM  Result Value Ref Range   HCV Ab 0.3 0.0 - 0.9 s/co ratio    Comment: (NOTE) Performed At: [facility] [place], [place] [id number] Rush Farmer MD [id number]   Basic metabolic panel     Status: Abnormal   Collection Time: 09/02/19  3:40 PM  Result Value Ref Range   Sodium 143 135 - 145 mmol/L   Potassium 3.9 3.5 - 5.1 mmol/L   Chloride 112 (H) 98 - 111 mmol/L   CO2 23 22 - 32 mmol/L   Glucose, Bld 142 (H) 70 - 99 mg/dL    Comment: Glucose reference range applies only to samples taken after fasting for at least 8 hours.   BUN 13 8 - 23 mg/dL   Creatinine, Ser 0.69 0.61 - 1.24 mg/dL   Calcium 6.9 (L) 8.9 - 10.3 mg/dL   GFR calc non Af Amer >60 >60 mL/min   GFR calc Af Amer >60 >60 mL/min   Anion gap 8 5 - 15    Comment: Performed at [facility] [address], [place], [place] [address]  Interpretation:     Status: None   Collection Time: 09/02/19  3:40 PM  Result Value Ref Range   HCV Interp 1: Comment     Comment: (NOTE) Negative Not infected with HCV, unless recent infection is suspected or other evidence exists to indicate HCV infection. Performed At: [facility] [place], [place] [id number] Rush Farmer MD [id number]   Glucose, capillary     Status: Abnormal   Collection Time: 09/02/19  3:47 PM  Result Value Ref Range   Glucose-Capillary 125 (H) 70 - 99 mg/dL    Comment: Glucose  reference range applies only to samples taken after fasting for at least 8 hours.  Glucose, capillary     Status: Abnormal   Collection Time: 09/02/19  7:55 PM  Result Value Ref Range   Glucose-Capillary 62 (L) 70 - 99 mg/dL    Comment: Glucose reference range applies only to samples taken after fasting for at least 8 hours.   Comment 1 Notify RN   Glucose, capillary     Status: Abnormal   Collection Time: 09/02/19  8:09 PM  Result Value Ref Range   Glucose-Capillary 69 (L) 70 - 99 mg/dL    Comment: Glucose reference range applies only to samples taken after fasting for at least 8 hours.  Basic metabolic panel     Status: Abnormal   Collection Time: 09/02/19  8:15 PM  Result Value Ref Range   Sodium 143 135 - 145 mmol/L   Potassium 4.4 3.5 - 5.1 mmol/L   Chloride 112 (H) 98 - 111 mmol/L  CO2 20 (L) 22 - 32 mmol/L   Glucose, Bld 81 70 - 99 mg/dL    Comment: Glucose reference range applies only to samples taken after fasting for at least 8 hours.   BUN 14 8 - 23 mg/dL   Creatinine, Ser 0.79 0.61 - 1.24 mg/dL   Calcium 7.1 (L) 8.9 - 10.3 mg/dL   GFR calc non Af Amer >60 >60 mL/min   GFR calc Af Amer >60 >60 mL/min   Anion gap 11 5 - 15    Comment: Performed at [facility] [address], [place], [place] [address]  Magnesium     Status: None   Collection Time: 09/02/19  8:15 PM  Result Value Ref Range   Magnesium 2.2 1.7 - 2.4 mg/dL    Comment: Performed at [facility] [address] [place], [place] [address]  Phosphorus     Status: None   Collection Time: 09/02/19  8:15 PM  Result Value Ref Range   Phosphorus 3.6 2.5 - 4.6 mg/dL    Comment: Performed at [facility] [address] [place], [place] [address]  Glucose, capillary     Status: Abnormal   Collection Time: 09/02/19  8:43 PM  Result Value Ref Range   Glucose-Capillary 115 (H) 70 - 99 mg/dL    Comment: Glucose reference range applies only to samples taken after fasting for at least 8 hours.    Lactic acid, plasma     Status: Abnormal   Collection Time: 09/02/19 10:29 PM  Result Value Ref Range   Lactic Acid, Venous 8.2 (HH) 0.5 - 1.9 mmol/L    Comment: CRITICAL VALUE NOTED.  VALUE IS CONSISTENT WITH PREVIOUSLY REPORTED AND CALLED VALUE. Performed at [facility], [place] [address] [place], [place] [address]   I-STAT 7, (LYTES, BLD GAS, ICA, H+H)     Status: Abnormal   Collection Time: 09/03/19 12:00 AM  Result Value Ref Range   pH, Arterial 7.222 (L) 7.35 - 7.45   pCO2 arterial 42.0 32 - 48 mmHg   pO2, Arterial 43 (L) 83 - 108 mmHg   Bicarbonate 17.4 (L) 20.0 - 28.0 mmol/L   TCO2 19 (L) 22 - 32 mmol/L   O2 Saturation 72.0 %   Acid-base deficit 10.0 (H) 0.0 - 2.0 mmol/L   Sodium 145 135 - 145 mmol/L   Potassium 4.4 3.5 - 5.1 mmol/L   Calcium, Ion 1.08 (L) 1.15 - 1.40 mmol/L   HCT 24.0 (L) 39 - 52 %   Hemoglobin 8.2 (L) 13.0 - 17.0 g/dL   Patient temperature 97.4 F    Collection site Radial    Drawn by RT    Sample type ARTERIAL   Glucose, capillary     Status: Abnormal   Collection Time: 09/03/19 12:09 AM  Result Value Ref Range   Glucose-Capillary 68 (L) 70 - 99 mg/dL    Comment: Glucose reference range applies only to samples taken after fasting for at least 8 hours.   Comment 1 Notify RN   Glucose, capillary     Status: None   Collection Time: 09/03/19  1:05 AM  Result Value Ref Range   Glucose-Capillary 86 70 - 99 mg/dL    Comment: Glucose reference range applies only to samples taken after fasting for at least 8 hours.  .Cooxemetry Panel (carboxy, met, total hgb, O2 sat)     Status: Abnormal   Collection Time: 09/03/19  1:47 AM  Result  Value Ref Range   Total hemoglobin 8.0 (L) 12.0 - 16.0 g/dL   O2 Saturation 99.4 %   Carboxyhemoglobin 1.6 (H) 0.5 - 1.5 %   Methemoglobin 0.5 0.0 - 1.5 %    Comment: Performed at [facility] [address] [place], [place] [address]  I-STAT 7, (LYTES, BLD GAS, ICA, H+H)     Status: Abnormal   Collection Time:  09/03/19  2:05 AM  Result Value Ref Range   pH, Arterial 7.263 (L) 7.35 - 7.45   pCO2 arterial 40.9 32 - 48 mmHg   pO2, Arterial 49 (L) 83 - 108 mmHg   Bicarbonate 18.6 (L) 20.0 - 28.0 mmol/L   TCO2 20 (L) 22 - 32 mmol/L   O2 Saturation 81.0 %   Acid-base deficit 8.0 (H) 0.0 - 2.0 mmol/L   Sodium 145 135 - 145 mmol/L   Potassium 4.5 3.5 - 5.1 mmol/L   Calcium, Ion 1.04 (L) 1.15 - 1.40 mmol/L   HCT 24.0 (L) 39 - 52 %   Hemoglobin 8.2 (L) 13.0 - 17.0 g/dL   Patient temperature 97.4 F    Collection site Radial    Drawn by RT    Sample type ARTERIAL   Lactic acid, plasma     Status: Abnormal   Collection Time: 09/03/19  3:00 AM  Result Value Ref Range   Lactic Acid, Venous 8.5 (HH) 0.5 - 1.9 mmol/L    Comment: CRITICAL VALUE NOTED.  VALUE IS CONSISTENT WITH PREVIOUSLY REPORTED AND CALLED VALUE. Performed at [facility], [place] [address] [place], [place] [address]   Comprehensive metabolic panel     Status: Abnormal   Collection Time: 09/03/19  3:11 AM  Result Value Ref Range   Sodium 143 135 - 145 mmol/L   Potassium 4.4 3.5 - 5.1 mmol/L   Chloride 111 98 - 111 mmol/L   CO2 18 (L) 22 - 32 mmol/L   Glucose, Bld 142 (H) 70 - 99 mg/dL    Comment: Glucose reference range applies only to samples taken after fasting for at least 8 hours.   BUN 15 8 - 23 mg/dL   Creatinine, Ser 1.00 0.61 - 1.24 mg/dL   Calcium 6.8 (L) 8.9 - 10.3 mg/dL   Total Protein 3.9 (L) 6.5 - 8.1 g/dL   Albumin 1.3 (L) 3.5 - 5.0 g/dL   AST 51 (H) 15 - 41 U/L   ALT 37 0 - 44 U/L   Alkaline Phosphatase 404 (H) 38 - 126 U/L   Total Bilirubin 0.9 0.3 - 1.2 mg/dL   GFR calc non Af Amer >60 >60 mL/min   GFR calc Af Amer >60 >60 mL/min   Anion gap 14 5 - 15    Comment: Performed at [facility] [address], [place], [place] [address]  CBC     Status: Abnormal   Collection Time: 09/03/19  3:11 AM  Result Value Ref Range   WBC 33.0 (H) 4.0 - 10.5 K/uL   RBC 2.50 (L) 4.22 - 5.81 MIL/uL   Hemoglobin  8.2 (L) 13.0 - 17.0 g/dL   HCT 27.0 (L) 39 - 52 %   MCV 108.0 (H) 80.0 - 100.0 fL   MCH 32.8 26.0 - 34.0 pg   MCHC 30.4 30.0 - 36.0 g/dL   RDW 20.9 (H) 11.5 - 15.5 %   Platelets 20 (LL) 150 - 400 K/uL    Comment: REPEATED TO VERIFY SPECIMEN CHECKED FOR CLOTS Immature  Platelet Fraction may be clinically indicated, consider ordering this additional test [id number] CRITICAL VALUE NOTED.  VALUE IS CONSISTENT WITH PREVIOUSLY REPORTED AND CALLED VALUE.    nRBC 1.2 (H) 0.0 - 0.2 %    Comment: Performed at [facility], [place] [address], [place], [place] [address]  Magnesium     Status: None   Collection Time: 09/03/19  3:11 AM  Result Value Ref Range   Magnesium 2.0 1.7 - 2.4 mg/dL    Comment: Performed at [facility] [address] [place], [place] [address]  Phosphorus     Status: None   Collection Time: 09/03/19  3:11 AM  Result Value Ref Range   Phosphorus 4.0 2.5 - 4.6 mg/dL    Comment: Performed at [facility] [address] [place], [place] [address]  Glucose, capillary     Status: Abnormal   Collection Time: 09/03/19  4:18 AM  Result Value Ref Range   Glucose-Capillary 52 (L) 70 - 99 mg/dL    Comment: Glucose reference range applies only to samples taken after fasting for at least 8 hours.   Comment 1 Notify RN   Glucose, capillary     Status: Abnormal   Collection Time: 09/03/19  4:23 AM  Result Value Ref Range   Glucose-Capillary 137 (H) 70 - 99 mg/dL    Comment: Glucose reference range applies only to samples taken after fasting for at least 8 hours.  POCT I-Stat EG7     Status: Abnormal   Collection Time: 09/03/19  4:32 AM  Result Value Ref Range   pH, Ven 7.172 (LL) 7.25 - 7.43   pCO2, Ven 49.4 44 - 60 mmHg   pO2, Ven 37.0 32 - 45 mmHg   Bicarbonate 18.2 (L) 20.0 - 28.0 mmol/L   TCO2 20 (L) 22 - 32 mmol/L   O2 Saturation 58.0 %   Acid-base deficit 10.0 (H) 0.0 - 2.0 mmol/L   Sodium 146 (H) 135 - 145 mmol/L   Potassium 4.4 3.5 - 5.1 mmol/L    Calcium, Ion 1.06 (L) 1.15 - 1.40 mmol/L   HCT 25.0 (L) 39 - 52 %   Hemoglobin 8.5 (L) 13.0 - 17.0 g/dL   Patient temperature 98.0 F    Sample type VENOUS   I-STAT 7, (LYTES, BLD GAS, ICA, H+H)     Status: Abnormal   Collection Time: 09/03/19  4:37 AM  Result Value Ref Range   pH, Arterial 7.067 (LL) 7.35 - 7.45   pCO2 arterial 64.7 (H) 32 - 48 mmHg   pO2, Arterial 31 (LL) 83 - 108 mmHg   Bicarbonate 18.7 (L) 20.0 - 28.0 mmol/L   TCO2 21 (L) 22 - 32 mmol/L   O2 Saturation 39.0 %   Acid-base deficit 12.0 (H) 0.0 - 2.0 mmol/L   Sodium 145 135 - 145 mmol/L   Potassium 4.4 3.5 - 5.1 mmol/L   Calcium, Ion 1.07 (L) 1.15 - 1.40 mmol/L   HCT 28.0 (L) 39 - 52 %   Hemoglobin 9.5 (L) 13.0 - 17.0 g/dL   Patient temperature 98.0 F    Collection site Radial    Drawn by RT    Sample type ARTERIAL   I-STAT 7, (LYTES, BLD GAS, ICA, H+H)     Status: Abnormal   Collection Time: 09/03/19  4:47 AM  Result Value Ref Range   pH, Arterial 7.200 (L) 7.35 - 7.45   pCO2 arterial 44.3 32 - 48 mmHg  pO2, Arterial 61 (L) 83 - 108 mmHg   Bicarbonate 17.4 (L) 20.0 - 28.0 mmol/L   TCO2 19 (L) 22 - 32 mmol/L   O2 Saturation 86.0 %   Acid-base deficit 10.0 (H) 0.0 - 2.0 mmol/L   Sodium 146 (H) 135 - 145 mmol/L   Potassium 4.4 3.5 - 5.1 mmol/L   Calcium, Ion 1.06 (L) 1.15 - 1.40 mmol/L   HCT 26.0 (L) 39 - 52 %   Hemoglobin 8.8 (L) 13.0 - 17.0 g/dL   Patient temperature 98.0 F    Collection site Radial    Drawn by RT    Sample type ARTERIAL   Glucose, capillary     Status: Abnormal   Collection Time: 09/03/19  7:45 AM  Result Value Ref Range   Glucose-Capillary 126 (H) 70 - 99 mg/dL    Comment: Glucose reference range applies only to samples taken after fasting for at least 8 hours.  Vancomycin, random     Status: None   Collection Time: 09/03/19  8:30 AM  Result Value Ref Range   Vancomycin Rm 19     Comment:        Random Vancomycin therapeutic range is dependent on dosage and time of specimen  collection. A peak range is 20.0-40.0 ug/mL A trough range is 5.0-15.0 ug/mL        Performed at [facility] [address] [place], [place] [address]   Glucose, capillary     Status: None   Collection Time: 09/03/19 11:45 AM  Result Value Ref Range   Glucose-Capillary 83 70 - 99 mg/dL    Comment: Glucose reference range applies only to samples taken after fasting for at least 8 hours.  I-STAT 7, (LYTES, BLD GAS, ICA, H+H)     Status: Abnormal   Collection Time: 09/03/19  2:24 PM  Result Value Ref Range   pH, Arterial 7.182 (LL) 7.35 - 7.45   pCO2 arterial 42.8 32 - 48 mmHg   pO2, Arterial 75 (L) 83 - 108 mmHg   Bicarbonate 16.3 (L) 20.0 - 28.0 mmol/L   TCO2 18 (L) 22 - 32 mmol/L   O2 Saturation 92.0 %   Acid-base deficit 11.0 (H) 0.0 - 2.0 mmol/L   Sodium 144 135 - 145 mmol/L   Potassium 4.9 3.5 - 5.1 mmol/L   Calcium, Ion 1.02 (L) 1.15 - 1.40 mmol/L   HCT 23.0 (L) 39 - 52 %   Hemoglobin 7.8 (L) 13.0 - 17.0 g/dL   Patient temperature 97.0 F    Collection site Radial    Drawn by RT    Sample type ARTERIAL   Glucose, capillary     Status: Abnormal   Collection Time: 09/03/19  3:59 PM  Result Value Ref Range   Glucose-Capillary <10 (LL) 70 - 99 mg/dL    Comment: Glucose reference range applies only to samples taken after fasting for at least 8 hours.  Glucose, capillary     Status: Abnormal   Collection Time: 09/03/19  4:01 PM  Result Value Ref Range   Glucose-Capillary <10 (LL) 70 - 99 mg/dL    Comment: Glucose reference range applies only to samples taken after fasting for at least 8 hours.  Glucose, capillary     Status: Abnormal   Collection Time: 09/03/19  4:08 PM  Result Value Ref Range   Glucose-Capillary 102 (H) 70 - 99 mg/dL    Comment: Glucose reference range applies only to samples taken after fasting for  at least 8 hours.    MICRO:  IMAGING: US Abdomen Complete  Result Date: 09/02/2019 CLINICAL DATA:  Right upper quadrant pain.  History of  pancreatitis. EXAM: ABDOMEN ULTRASOUND COMPLETE COMPARISON:  Abdomen MRI dated 02/01/2015. FINDINGS: Gallbladder: Distended. Irregular wall thickening a maximum of 4 mm. Multiple gallbladder wall projections consistent polyps, largest 8 mm. Dependent sludge. No shadowing stones. Common bile duct: Diameter: 7 mm Liver: Increased parenchymal echogenicity. Subtle surface nodularity suggested. Coarsened echotexture. No masses. Normal in overall size. Portal vein is patent on color Doppler imaging with normal direction of blood flow towards the liver. IVC: No abnormality visualized. Pancreas: Multiple foci of increased echogenicity consistent with calcifications. Duct irregularly dilated a maximum of 1 cm. This is stable compared to the prior MRI. Spleen: Size and appearance within normal limits. Right Kidney: Length: 9.6 cm. Echogenicity within normal limits. No mass or hydronephrosis visualized. Left Kidney: Length: 9.7 cm. Echogenicity within normal limits. No mass or hydronephrosis visualized. Abdominal aorta: Atherosclerotic irregularity.  No aneurysm. Other findings: Right pleural effusion. Small to moderate amount of ascites. IMPRESSION: 1. Small to moderate amount of ascites.  Right pleural effusion. 2. There are pancreatic calcifications and a dilated pancreatic duct consistent with chronic pancreatitis. Acute component of pancreatitis is not excluded. 3. Liver shows coarsened echotexture with generalized increased echogenicity and subtle surface nodularity. Cirrhosis should be considered in the proper clinical setting. No liver masses. 4. Mild gallbladder wall thickening, several polyps and dependent sludge, but no convincing acute cholecystitis. Electronically Signed   By: Lajean Manes M.D.   On: 09/02/2019 16:21   DG Chest Port 1 View  Result Date: 09/03/2019 CLINICAL DATA:  64 year old male respiratory failure.  Pancreatitis. EXAM: PORTABLE CHEST 1 VIEW COMPARISON:  Portable chest 09/02/2019 and  earlier. FINDINGS: Portable AP semi upright view at 0323 hours. Endotracheal tube tip in satisfactory position between the clavicles and carina. Stable enteric tube looped in the stomach. Stable right IJ approach central line. Mildly lower lung volumes. Coarse and confluent bilateral upper lobe predominant airspace disease has not significantly changed. No superimposed pneumothorax or pleural effusion. Mediastinal contours remain within normal limits. IMPRESSION: 1. Endotracheal tube adjusted and tip in good position. Otherwise stable lines and tubes. 2. Mildly lower lung volumes, otherwise stable bilateral upper lobe predominant pneumonia. Electronically Signed   By: Genevie Ann M.D.   On: 09/03/2019 03:29   Portable Chest x-ray  Result Date: 09/02/2019 CLINICAL DATA:  Intubation and central line placement. Respiratory failure. EXAM: PORTABLE CHEST 1 VIEW COMPARISON:  Earlier film, same date. FINDINGS: The endotracheal tube is in the right mainstem bronchus at least 3 cm. It should be retracted at least 5 cm. The NG tube is coursing down the esophagus and into the stomach. Right IJ central venous catheter tip is in the distal SVC near the cavoatrial junction. Persistent upper lobe predominant airspace process IMPRESSION: 1. The endotracheal tube is in the right mainstem bronchus and should be retracted at least 5 cm. 2. Right IJ central venous catheter and NG tube in good position. 3. Persistent upper lobe predominant airspace process. These results will be called to the ordering clinician or representative by the Radiologist Assistant, and communication documented in the PACS or [employer]. Electronically Signed   By: Marijo Sanes M.D.   On: 09/02/2019 11:09   DG CHEST PORT 1 VIEW  Result Date: 09/02/2019 CLINICAL DATA:  Shortness of breath and hypoxia EXAM: PORTABLE CHEST 1 VIEW COMPARISON:  08/27/2019 FINDINGS: Cardiac shadow is  stable. Persistent airspace opacities are noted predominately in the  upper lobes bilaterally. Some hilar retraction is noted. The overall appearance is stable from the prior study. IMPRESSION: No new focal abnormality is noted. Persistent bilateral airspace opacities are seen. Electronically Signed   By: Inez Catalina M.D.   On: 09/02/2019 09:05    Assessment/Plan:  64yo M who is critically ill with respiratory distress on vent, and refractory hypotension with candidemia suspect to be related to picc line. - start anidulafungin - recommend to removed current picc line and have peripheral access if feasible vs start new picc line on other arm tomorrow, given he needs 2 pressors at max doses - plan to repeat blood cx tomorrow - aspiration pneumonia - has staph aureus and GNR on gram stain. Would recommend to continue on vanco and cefepime for now  Condition overall guarded

## 2019-09-03 NOTE — Progress Notes (Signed)
NAME:  Michael [name], MRN:  [id number], DOB:  06-01-1955, LOS: 9 ADMISSION DATE:  07/25/2019, CONSULTATION DATE:  09/02/2019 REFERRING MD:  [place] Crane, CHIEF COMPLAINT:  AMS, acute respiratory distress   Brief History   64 year old male with history of uncontrolled insulin-dependent diabetes mellitus presented on 08/22/2019 with seizure activity, altered mental status, hyperglycemia and hypokalemia. Initially intubated and admitted to PCCM unil 08/26/2019 when transfer to hospitalist service.  On the morning of 09/02/2019, patient with altered mental status, increased respiratory rate and hypoxia requiring non-rebreather mask. Also noted to be significantly hypotensive for which vasopressors started. PCCM reconsulted and patient required transfer back to ICU where re-intubated for respiratory distress and also on vasopressors.   History of present illness   [name] is a 64 year old male with history of bipolar disorder, schizophrenia, chronic pancreatitis, hepatitis B, and uncontrolled diabetes mellitus initially admitted on 7/22 with acute hypoxemic respiratory failure, aspiration pneumonia, septic shock secondary to aspiration pneumonia, HHS, seizure activity and encephalopathy requiring intubation and ICU admission. He was treated and extubated on 7/31 and transferred to the floors on 08/26/2019.   Past Medical History   Past Medical History:  Diagnosis Date  . Anxiety   . Arthritis    "joints; shoulders; feet" (06/08/2016)  . Bipolar disorder ([place])   . Daily headache    "7:30 - 8:00 q night" (06/08/2016)  . Diabetic peripheral neuropathy ([place])   . GERD (gastroesophageal reflux disease)   . History of hepatitis B virus infection conferring immunity 03/22/2018  . Pancreatitis   . Schizophrenia ([place])   . Seizures ([place]) 01/2016 X 2  . Type II diabetes mellitus ([place])    Significant Hospital Events   7/22 admitted for AMS, CBG>1400, seizure activity, aspiration PNA, VDRF and shock requiring  pressors 7/23 off pressors 7/24 urology placed foley  7/25 reintubated due to worsening hypoxia 7/27 Afib with RVR for which started on amiodarone 8/3 transferred out of ICU  8/7 poor nutrition status, palliative care consulted 8/9 recurrent hypoglycemia, hypoxia 8/10 transferred to ICU for respiratory distress and AMS 8/11 persistently hypoxic and hypotensive requiring multiple pressors   Consults:  PCCM  Procedures:  Lt IJ CVL 7/22>8/3 A line 7/25 > 8/3 ETT 7/25> 7/31 ETT 8/10>> Rt IJ CVL 8/10 >>  Significant Diagnostic Tests:   EEG 7/22 >> continuous slowing  CT head 7/22 >> atrophy, chronic small vessel ischemic changes, chronic thalamic lacunar infarcts  CT chest 7/22 >> multifocal pneumonia, changes of cirrhosis, small volume ascites, atherosclerosis  Echo 7/28 >> EF 60 to 65%, grade 1 DD  Micro Data:  Blood cx 7/22 > negative Urine cx 7/22> negative MRSA 7/22 negative Tracheal aspirate 7/29 > normal respiratory flora Blood cx 8/10 >> candida albicans and candida tropicalis    Antimicrobials:  Vancomycin 7/22, 8/10 >> Cefepime 7/22 > 7/25, 8/10 >> Ceftriaxone 7/25> 7/28 Eraxis 8/11 >>    Interim history/subjective:  Overnight, patient with persistent acidemia, recurrent hypoxia with increasing lactate and pressor requirement. Ventilator settings adjusted and pt started on albumin. Presors increased and D10W restarted for recurrent hypoglycemia.  Also started on bicarb infusion.   Objective   Blood pressure 114/89, pulse 82, temperature 98.3 F (36.8 C), temperature source Oral, resp. rate (!) 32, height 5\' 4"  (1.626 m), weight 72 kg, SpO2 (!) 60 %. CVP:  [6 mmHg-11 mmHg] 8 mmHg  Vent Mode: PRVC FiO2 (%):  [50 %-100 %] 100 % Set Rate:  [26 bmp-32 bmp] 32  bmp Vt Set:  [440 mL-550 mL] 440 mL PEEP:  [8 cmH20-14 cmH20] 14 cmH20 Plateau Pressure:  [24 cmH20-35 cmH20] 24 cmH20   Intake/Output Summary (Last 24 hours) at 09/03/2019 0838 Last data filed at  09/03/2019 0639 Gross per 24 hour  Intake 6951.36 ml  Output 235 ml  Net 6716.36 ml   Filed Weights   09/01/19 0157 09/02/19 0450 09/03/19 0457  Weight: 59.7 kg 61.9 kg 72 kg    Examination: General: chronically ill appearing male, thin and frail, intubated, appears uncomfortable.  HENT: [place]/AT, PERRL, ET tube in place Lungs: clear to auscultation bilaterally Cardiovascular: RRR, S1 and S2 present, no m/r/g Abdomen: soft, nondistended, normoactive bowel sounds  Neuro: minimally responsive to verbal stimuli; PERRL, no apparent focal deficits  Skin: sacral decubitus ulcer with dressing in place; ulcer at tip of penis   Assessment & Plan:  Acute hypoxic respiratory failure suspected to be secondary to aspiration pneumonia:  Septic shock suspected secondary to aspiration pneumonia and candidemia:   - Patient admitted with suspected recurrent aspiration pneumonia. BCID resulted 2/4 candida albicans and candida tropicalis for which started on antifungal coverage. Remains persistently hypoxic and hypotensive  - Mechanical ventilation with TV 6-8cc/kg - Broad spectrum antibiotic coverage and antifungal initiated this morning  - Continue fluid resuscitation and pressor support for persistent hypotension  - VAP per protocol   Acute metabolic encephalopathy: Likely secondary to hypoxia vs sepsis; currently minimally responsive without any sedation.  - Current RASS goal -2 to -3 while on vent    Hx of transient atrial fibrillation with RVR Diastolic congestive heart failure  - Intermittently required amiodarone for A fib with RVR and lasix for hypervolemia  - Currently normal sinus rhythm and hypovolemic in setting of shock - Continue cardiac monitoring   Hx of poorly controlled diabetes mellitus  Hypoglycemia  HbA1c 11.1; multiple episodes of hypoglycemia; likely secondary to poor PO intake and decreased glycogen stores - CBG monitoring q4h - tube feeds per dietician - Will hold off  insulin at this time given multiple hypoglycemic episodes  - D10W gtt for persistent hypoglycemia   Urinary retention secondary to phimosis - Foley in place, minimal urine output   Hx of seizure activity and bipolar disorder - Continue Keppra; holding abilify and neurontin   GOC: Discussed patient status and poor prognosis given persistent hypotension despite multiple pressors and ongoing hypoxia even with mechanical ventilation despite maximum support and ongoing broad spectrum antibiotics. She would like to continue with aggressive care this time.   Best practice:  Diet: tube feeds Pain/Anxiety/Delirium protocol (if indicated): per protocol VAP protocol (if indicated): per protocol DVT prophylaxis: SCDs GI prophylaxis: Protonix Glucose control: Hypoglycemic Mobility: Bed rest Code Status: FULL  Family Communication: Patient's sister, [name], updated on patient status 8/11 Disposition: ICU  Critical care time: 40 minutes     Harvie Heck, MD Internal Medicine, PGY-2 09/03/19 8:38 AM Pager # [phone]

## 2019-09-03 NOTE — Progress Notes (Signed)
PHARMACY - PHYSICIAN COMMUNICATION CRITICAL VALUE ALERT - BLOOD CULTURE IDENTIFICATION (BCID)  Michael [name] is an 64 y.o. male who presented to [facility] on 08/16/2019 for grand mal seizure likely secondary to glucose of >1400.  He was transferred to the ICU on 8/10 for intubation due to worsening oxygen requirements and mental status changes.   Assessment:  Septic shock - He was started on broad-spectrum antibiotics for possible pneumonia/sepsis with vancomycin and cefepime and has required max dose vasopressors for persistent hypotension. WBC today are up from 7.5 to 33. Lactic acid also continues to trend up from 3.7 to 8.5 in less than 24 hours.  BCID resulted 2/4 (1 bottle each set) positive for candida albicans and candida tropicalis. Patient was already initiated on Eraxis this morning per ID pharmacy team recommendations so no change is necessary at this time.  Name of physician (or Provider) Contacted: Chesley Mires, MD  Current antibiotics:  Vanc 8/10>> Cefepime 8/10>> Eraxis 8/11 >>  Changes to prescribed antibiotics recommended:  Patient is on recommended antibiotics - No changes needed  Will continue to monitor clinical progress, LOT and de-escalate antimicrobials as necessary.   Results for orders placed or performed during the hospital encounter of 07/29/2019  Blood Culture ID Panel (Reflexed) (Collected: 09/02/2019 12:05 PM)  Result Value Ref Range   Enterococcus faecalis NOT DETECTED NOT DETECTED   Enterococcus Faecium NOT DETECTED NOT DETECTED   Listeria monocytogenes NOT DETECTED NOT DETECTED   Staphylococcus species NOT DETECTED NOT DETECTED   Staphylococcus aureus (BCID) NOT DETECTED NOT DETECTED   Staphylococcus epidermidis NOT DETECTED NOT DETECTED   Staphylococcus lugdunensis NOT DETECTED NOT DETECTED   Streptococcus species NOT DETECTED NOT DETECTED   Streptococcus agalactiae NOT DETECTED NOT DETECTED   Streptococcus pneumoniae NOT DETECTED NOT DETECTED    Streptococcus pyogenes NOT DETECTED NOT DETECTED   A.calcoaceticus-baumannii NOT DETECTED NOT DETECTED   Bacteroides fragilis NOT DETECTED NOT DETECTED   Enterobacterales NOT DETECTED NOT DETECTED   Enterobacter cloacae complex NOT DETECTED NOT DETECTED   Escherichia coli NOT DETECTED NOT DETECTED   Klebsiella aerogenes NOT DETECTED NOT DETECTED   Klebsiella oxytoca NOT DETECTED NOT DETECTED   Klebsiella pneumoniae NOT DETECTED NOT DETECTED   Proteus species NOT DETECTED NOT DETECTED   Salmonella species NOT DETECTED NOT DETECTED   Serratia marcescens NOT DETECTED NOT DETECTED   Haemophilus influenzae NOT DETECTED NOT DETECTED   Neisseria meningitidis NOT DETECTED NOT DETECTED   Pseudomonas aeruginosa NOT DETECTED NOT DETECTED   Stenotrophomonas maltophilia NOT DETECTED NOT DETECTED   Candida albicans DETECTED (A) NOT DETECTED   Candida auris NOT DETECTED NOT DETECTED   Candida glabrata NOT DETECTED NOT DETECTED   Candida krusei NOT DETECTED NOT DETECTED   Candida parapsilosis NOT DETECTED NOT DETECTED   Candida tropicalis DETECTED (A) NOT DETECTED   Cryptococcus neoformans/gattii NOT DETECTED NOT DETECTED    Amedeo Plenty 09/03/2019  9:47 AM

## 2019-09-03 NOTE — Procedures (Signed)
Arterial Catheter Insertion Procedure Note YIFAN AUKER [id number] November 06, 1955  Procedure: Insertion of Arterial Catheter  Indications: Blood pressure monitoring and Frequent blood sampling  Procedure Details Consent: Unable to obtain consent because of emergent medical necessity. Time Out: Verified patient identification, verified procedure, site/side was marked, verified correct patient position, special equipment/implants available, medications/allergies/relevent history reviewed, required imaging and test results available.  Performed  Maximum sterile technique was used including antiseptics, cap, gloves, gown, hand hygiene, mask and sheet. Skin prep: Chlorhexidine; local anesthetic administered 20 gauge catheter was inserted into right femoral artery using the Seldinger technique.  Evaluation Blood flow good; BP tracing pending. Complications: No apparent complications.  Procedure performed under supervision of Noe Gens, NP and Dr. Chesley Mires, MD.   Harvie Heck, MD Internal Medicine, PGY-2 09/03/19 10:34 AM Pager # [phone]

## 2019-09-03 NOTE — Progress Notes (Signed)
[place] Progress Note Patient Name: Michael [name] DOB: 07-09-1955 MRN: [id number]   Date of Service  09/03/2019  HPI/Events of Note  Lactic Acid = 3.7 --> 8.2. Etiology? ABG on 70%/PRVC 26/TV 470/P 8 = 7.22/42.0/43 (Venous? Sat = 100% by oximetry). Hgb = 9.8 --> 8.2 Bleeding??  eICU Interventions  Plan: 1. Increase PRVC rate to 32. 2. NaHCO3 100 meq IV now. 3. COOX when CVP >= 10.  4. Repeat ABG at 2 AM. 5. Will ask ground team to evaluate the patient at bedside.      Intervention Category Major Interventions: Acid-Base disturbance - evaluation and management;Hypotension - evaluation and management  Lysle Dingwall 09/03/2019, 12:54 AM

## 2019-09-03 NOTE — Progress Notes (Signed)
[place] Progress Note Patient Name: Michael [name] DOB: [birth year] MRN: [id number]   Date of Service  09/03/2019  HPI/Events of Note  ABG on 100%/PRVC  ?TV 440/P 16 = 7.219/36.1/86/14.9  eICU Interventions  Plan: 1. NaHCO3 100 meq IV now. 2. Increase NaHCO3 IV infusion to 100 mL/hour.  3. Repeat ABG at 5 AM.     Intervention Category Major Interventions: Acid-Base disturbance - evaluation and management;Respiratory failure - evaluation and management  Lysle Dingwall 09/03/2019, 9:37 PM

## 2019-09-03 NOTE — Progress Notes (Signed)
Patient O2 saturations are not reading consistently. Attempted to placed O2 probes in different locations and still unsuccessful. MD made aware.

## 2019-09-03 NOTE — Progress Notes (Signed)
Hypoglycemic Event  CBG: 68  Treatment: D50 50 mL (25 gm)  Symptoms: None  Follow-up CBG: Time: 0105 CBG Result: 86  Possible Reasons for Event: Inadequate meal intake  Comments/MD notified: elink    Mardi Mainland

## 2019-09-03 NOTE — Progress Notes (Signed)
[facility] Progress Note Patient Name: Michael [name] DOB: 02/25/1955 MRN: [id number]   Date of Service  09/03/2019  HPI/Events of Note  ABG on 100%/PRVC 32/TV 440/P 14 = 7.20/44.3/61.   eICU Interventions  Plan: 1. NaHCO3 100 meq IV now.  2. NaHCO3 IV infusion at 75 mL/hour.  3. Repeat ABG at 7:30 AM.      Intervention Category Major Interventions: Acid-Base disturbance - evaluation and management;Respiratory failure - evaluation and management  Kagen Kunath Cornelia Copa 09/03/2019, 5:10 AM

## 2019-09-03 NOTE — Plan of Care (Signed)
Called to bedside to evaluate increasing lactate and pressor requirements.  No urine output this shift.  Recurrent hypoxia requiring treatment.  Recent ABG with hypoxia despite SpO2 100%.  FiO2 previously increased to 70%.  Increasing PEEP from 8-10.  BP (!) 87/58   Pulse 92   Temp (!) 97.4 F (36.3 C) (Oral)   Resp (!) 28   Ht 5\' 4"  (1.626 m)   Wt 61.9 kg   SpO2 98%   BMI 23.42 kg/m  Laying in bed intubated, not sedated, minimally responsive to stimulation, RASS -4.  Abdomen soft, nontender, nondistended Dependent pitting edema. Foley with bloody output- flushed with minimal return. Feet & hands warm, well perfused.  No mottling or cyanosis.  Bladder scan with 60 cc urine.  Plan: Repeat ABG in 1 hr to ensure hypoxia corrected. Repeat lactate ordered Albumin in lieu of additional NS given hyperchloremia and pitting edema that has developed this shift per his nurse. Increase vasopressin to 0.04, con't NE Already on broad spectrum antibiotics Restarting D10W at 25cc/hr to tx hypoglycemia.  Julian Hy, DO 09/03/19 1:21 AM [facility]

## 2019-09-03 NOTE — Progress Notes (Addendum)
Pharmacy Antibiotic Note - Follow Up  Michael [name] is a 64 y.o. male admitted on 08/05/2019 for grand mal seizure likely secondary to glucose of >1400.  He was transferred to the ICU on 8/10 for intubation due to worsening oxygen requirements and mental status changes. Pharmacy has been consulted for vancomycin and cefepime dosing for possible pneumonia/sepsis.   WBC today are up from 7.5 to 33. Lactic acid also continues to trend up from 3.7 to 8.5 in less than 24 hours.  Pt had a loading dose of vancomycin 1,250mg  and one additional dose of 750mg . A random vancomycin trough was drawn after kidney function significantly declined (SCr of 0.46 to 1.0 in 24h), and resulted as 1mcg/dL which is therapeutic.   Of note, BCID resulted 2/4 (1 bottle each set) positive for candida albicans and candida tropicalis. Results were discussed with Dr. Chesley Mires. Patient was already initiated on Eraxis this morning with candidemia dosing per ID pharmacy team recommendations so no change is necessary at this time.  Plan:  Give one dose of vancomycin 1g IV and follow up with SCr trend tomorrow for further dosing Continue cefepime 2g IV every 8 hours  Continue Eraxis 200mg  x1 IV then 100mg  q24h Continue to monitor clinical progress, LOT and de-escalate antimicrobials as necessary.  Height: 5\' 4"  (162.6 cm) Weight: 72 kg (158 lb 11.7 oz) IBW/kg (Calculated) : 59.2  Temp (24hrs), Avg:95.8 F (35.4 C), Min:92.3 F (33.5 C), Max:98.3 F (36.8 C)  Recent Labs  Lab 08/31/19 0937 08/31/19 0937 09/01/19 0549 09/02/19 0240 09/02/19 1044 09/02/19 1106 09/02/19 1403 09/02/19 1405 09/02/19 1540 09/02/19 2015 09/02/19 2229 09/03/19 0300 09/03/19 0311 09/03/19 0830  WBC 9.6  --   --   --   --  QUESTIONABLE RESULTS, RECOMMEND RECOLLECT TO VERIFY 7.5  --   --   --   --   --  33.0*  --   CREATININE 0.73   < > 0.53* 0.46*  --   --   --   --  0.69 0.79  --   --  1.00  --   LATICACIDVEN  --   --   --   --   3.2*  --   --  3.7*  --   --  8.2* 8.5*  --   --   VANCORANDOM  --   --   --   --   --   --   --   --   --   --   --   --   --  19   < > = values in this interval not displayed.    Estimated Creatinine Clearance: 70.6 mL/min (by C-G formula based on SCr of 1 mg/dL).    Allergies  Allergen Reactions  . Ace Inhibitors Swelling and Other (See Comments)    Angioedema - unquestionable  . Losartan Swelling and Other (See Comments)    Pt presented with unquestionable angioedema on ACEIs (Angiotensin-converting enzyme (ACE) inhibitors) so aviod ARBs (Angiotensin receptor blockers), as well  . Tylenol [Acetaminophen] Other (See Comments)    "cannot have this" (takes Percocet at home)    Antimicrobials this admission: Vanc 8/10>> Cefepime 8/10>> Eraxis 8/11 >  Microbiology results: 8/10 resp Cx - abundant GPCc/p, rare GNR/GPR 8/11 BCx - 1/2 candida albicans and tropicalis in each set  Thank you for allowing pharmacy to be a part of this patient's care.  [place] 09/03/2019 10:24 AM

## 2019-09-03 NOTE — Progress Notes (Signed)
[facility] [place] Progress Note Patient Name: Michael [name] DOB: 08-23-1955 MRN: [id number]   Date of Service  09/03/2019  HPI/Events of Note  Multiple issues: 1. Hyperglycemia - Blood glucose = 102 --> 102 and 2. Last pH = 7.182.  eICU Interventions  Plan: 1. Change POC CBG to Q 4 hours. 2. ABG now.      Intervention Category Major Interventions: Hyperglycemia - active titration of insulin therapy  Lysle Dingwall 09/03/2019, 8:35 PM

## 2019-09-04 DIAGNOSIS — J189 Pneumonia, unspecified organism: Secondary | ICD-10-CM | POA: Diagnosis not present

## 2019-09-04 DIAGNOSIS — A419 Sepsis, unspecified organism: Secondary | ICD-10-CM | POA: Diagnosis not present

## 2019-09-04 DIAGNOSIS — R4182 Altered mental status, unspecified: Secondary | ICD-10-CM | POA: Diagnosis not present

## 2019-09-04 DIAGNOSIS — B371 Pulmonary candidiasis: Secondary | ICD-10-CM | POA: Diagnosis not present

## 2019-09-04 DIAGNOSIS — Z515 Encounter for palliative care: Secondary | ICD-10-CM | POA: Diagnosis not present

## 2019-09-04 DIAGNOSIS — E872 Acidosis: Secondary | ICD-10-CM | POA: Diagnosis not present

## 2019-09-04 DIAGNOSIS — J9600 Acute respiratory failure, unspecified whether with hypoxia or hypercapnia: Secondary | ICD-10-CM | POA: Diagnosis not present

## 2019-09-04 DIAGNOSIS — R6521 Severe sepsis with septic shock: Secondary | ICD-10-CM | POA: Diagnosis not present

## 2019-09-04 DIAGNOSIS — Z7189 Other specified counseling: Secondary | ICD-10-CM | POA: Diagnosis not present

## 2019-09-04 LAB — GLUCOSE, CAPILLARY
Glucose-Capillary: 120 mg/dL — ABNORMAL HIGH (ref 70–99)
Glucose-Capillary: 149 mg/dL — ABNORMAL HIGH (ref 70–99)
Glucose-Capillary: 152 mg/dL — ABNORMAL HIGH (ref 70–99)
Glucose-Capillary: 152 mg/dL — ABNORMAL HIGH (ref 70–99)
Glucose-Capillary: 155 mg/dL — ABNORMAL HIGH (ref 70–99)
Glucose-Capillary: 176 mg/dL — ABNORMAL HIGH (ref 70–99)
Glucose-Capillary: <10 mg/dL — CL (ref 70–99)

## 2019-09-04 LAB — POCT I-STAT 7, (LYTES, BLD GAS, ICA,H+H)
Acid-base deficit: 9 mmol/L — ABNORMAL HIGH (ref 0.0–2.0)
Acid-base deficit: 8 mmol/L — ABNORMAL HIGH (ref 0.0–2.0)
Bicarbonate: 17.7 mmol/L — ABNORMAL LOW (ref 20.0–28.0)
Bicarbonate: 18.3 mmol/L — ABNORMAL LOW (ref 20.0–28.0)
Calcium, Ion: 1.02 mmol/L — ABNORMAL LOW (ref 1.15–1.40)
Calcium, Ion: 0.96 mmol/L — ABNORMAL LOW (ref 1.15–1.40)
HCT: 22 % — ABNORMAL LOW (ref 39.0–52.0)
HCT: 23 % — ABNORMAL LOW (ref 39.0–52.0)
Hemoglobin: 7.5 g/dL — ABNORMAL LOW (ref 13.0–17.0)
Hemoglobin: 7.8 g/dL — ABNORMAL LOW (ref 13.0–17.0)
O2 Saturation: 99 %
O2 Saturation: 100 %
Patient temperature: 97.5
Patient temperature: 97.2
Potassium: 4.8 mmol/L (ref 3.5–5.1)
Potassium: 5.1 mmol/L (ref 3.5–5.1)
Sodium: 141 mmol/L (ref 135–145)
Sodium: 140 mmol/L (ref 135–145)
TCO2: 19 mmol/L — ABNORMAL LOW (ref 22–32)
TCO2: 19 mmol/L — ABNORMAL LOW (ref 22–32)
pCO2 arterial: 38.7 mmHg (ref 32.0–48.0)
pCO2 arterial: 38.8 mmHg (ref 32.0–48.0)
pH, Arterial: 7.266 — ABNORMAL LOW (ref 7.350–7.450)
pH, Arterial: 7.277 — ABNORMAL LOW (ref 7.350–7.450)
pO2, Arterial: 171 mmHg — ABNORMAL HIGH (ref 83.0–108.0)
pO2, Arterial: 228 mmHg — ABNORMAL HIGH (ref 83.0–108.0)

## 2019-09-04 LAB — LACTIC ACID, PLASMA
Lactic Acid, Venous: >11 mmol/L (ref 0.5–1.9)
Lactic Acid, Venous: >11 mmol/L (ref 0.5–1.9)

## 2019-09-04 LAB — CBC
HCT: 24 % — ABNORMAL LOW (ref 39.0–52.0)
Hemoglobin: 7.3 g/dL — ABNORMAL LOW (ref 13.0–17.0)
MCH: 33.3 pg (ref 26.0–34.0)
MCHC: 30.4 g/dL (ref 30.0–36.0)
MCV: 109.6 fL — ABNORMAL HIGH (ref 80.0–100.0)
Platelets: 12 10*3/uL — CL (ref 150–400)
RBC: 2.19 MIL/uL — ABNORMAL LOW (ref 4.22–5.81)
RDW: 21.5 % — ABNORMAL HIGH (ref 11.5–15.5)
WBC: 55.8 10*3/uL (ref 4.0–10.5)
nRBC: 0.3 % — ABNORMAL HIGH (ref 0.0–0.2)

## 2019-09-04 LAB — COMPREHENSIVE METABOLIC PANEL
ALT: 120 U/L — ABNORMAL HIGH (ref 0–44)
AST: 314 U/L — ABNORMAL HIGH (ref 15–41)
Albumin: 1.2 g/dL — ABNORMAL LOW (ref 3.5–5.0)
Alkaline Phosphatase: 363 U/L — ABNORMAL HIGH (ref 38–126)
Anion gap: 19 — ABNORMAL HIGH (ref 5–15)
BUN: 21 mg/dL (ref 8–23)
CO2: 16 mmol/L — ABNORMAL LOW (ref 22–32)
Calcium: 6.8 mg/dL — ABNORMAL LOW (ref 8.9–10.3)
Chloride: 107 mmol/L (ref 98–111)
Creatinine, Ser: 1.52 mg/dL — ABNORMAL HIGH (ref 0.61–1.24)
GFR calc Af Amer: 56 mL/min — ABNORMAL LOW (ref >60)
GFR calc non Af Amer: 49 mL/min — ABNORMAL LOW (ref >60)
Glucose, Bld: 141 mg/dL — ABNORMAL HIGH (ref 70–99)
Potassium: 4.9 mmol/L (ref 3.5–5.1)
Sodium: 142 mmol/L (ref 135–145)
Total Bilirubin: 2.1 mg/dL — ABNORMAL HIGH (ref 0.3–1.2)
Total Protein: 3.7 g/dL — ABNORMAL LOW (ref 6.5–8.1)

## 2019-09-04 LAB — COOXEMETRY PANEL
Carboxyhemoglobin: 1 % (ref 0.5–1.5)
Methemoglobin: 1.1 % (ref 0.0–1.5)
O2 Saturation: 67.5 %
Total hemoglobin: 7.3 g/dL — ABNORMAL LOW (ref 12.0–16.0)

## 2019-09-04 LAB — CULTURE, RESPIRATORY W GRAM STAIN

## 2019-09-04 MED ORDER — SODIUM CHLORIDE 0.9 % IV SOLN: 2.0000 g | Freq: Two times a day (BID) | INTRAVENOUS | Status: DC

## 2019-09-04 MED ORDER — DOCUSATE SODIUM 50 MG/5ML PO LIQD: 100.0000 mg | Freq: Every day | ORAL | Status: DC | PRN

## 2019-09-04 MED ORDER — ALBUMIN HUMAN 25 % IV SOLN
50.0000 g | Freq: Once | INTRAVENOUS | Status: AC
  Administered 2019-09-04: 50 g via INTRAVENOUS
  Filled 2019-09-04: qty 200

## 2019-09-04 MED ORDER — SODIUM CHLORIDE 0.9 % IV SOLN
3.0000 g | Freq: Four times a day (QID) | INTRAVENOUS | Status: DC
  Administered 2019-09-04 – 2019-09-09 (×21): 3 g via INTRAVENOUS
  Filled 2019-09-04 (×20): qty 8

## 2019-09-04 NOTE — Progress Notes (Signed)
Daily Progress Note   Patient Name: EMMAUEL HALLUMS       Date: 09/04/2019 DOB: 05-17-1955  Age: 64 y.o. MRN#: [id number] Attending Physician: Chesley Mires, MD Primary Care Physician: [facility] Admit Date: 08/19/2019  Reason for Consultation/Follow-up: Establishing goals of care  Subjective: Chart reviewed including personal review of pertinent labs and imaging.  Mr. Mendell was being followed by palliative care team prior to readmission to the ICU.  At time of last encounter with Mr. Morini by palliative care provider, he had indicated desire for DNR/DNI but this was reversed by his sister whenever he acutely decompensated.  I called and was able to reach his sister, [name], to discuss care plan moving forward.  [name] reports that the doctors have been doing a good job explaining things to her.  She states that she is hopeful that her brother can improve and be weaned off the ventilator as he was intubated and later successfully extubated earlier this admission.  She reports being overwhelmed when call was made asking if she wanted interventions done to "save my brother or just let him die."  At the time of my call, [name] was watching small children and preoccupied with monitoring them rather than being fully engaged in conversation with me.  We discussed plan to continue to see how he does over the next couple of days with plan for further conversation followed by palliative medicine team to progress conversation based upon his clinical course.  She was agreeable to this.  Length of Stay: 21  Current Medications: Scheduled Meds:  . chlorhexidine gluconate (MEDLINE KIT)  15 mL Mouth Rinse BID  . Chlorhexidine Gluconate Cloth  6 each Topical Q0600  . feeding supplement (PROSource  TF)  45 mL Per Tube Daily  . ipratropium-albuterol  3 mL Nebulization Q6H  . mouth rinse  15 mL Mouth Rinse 10 times per day  . multivitamin with minerals  1 tablet Per Tube Daily  . nicotine  7 mg Transdermal Daily  . pantoprazole (PROTONIX) IV  40 mg Intravenous Q24H  . sodium chloride flush  10-40 mL Intracatheter Q12H    Continuous Infusions: . sodium chloride 10 mL/hr at 09/03/19 1250  . ampicillin-sulbactam (UNASYN) IV 3 g (09/04/19 1704)  . anidulafungin 100 mg (09/04/19 0949)  . dextrose 50 mL/hr at  09/04/19 1600  . feeding supplement (VITAL AF 1.2 CAL) 1,000 mL (09/04/19 0830)  . levETIRAcetam 500 mg (09/04/19 1205)  . norepinephrine (LEVOPHED) Adult infusion 60 mcg/min (09/04/19 1721)  .  sodium bicarbonate (isotonic) infusion in sterile water 100 mL/hr at 09/04/19 1722  . vasopressin 0.04 Units/min (09/04/19 1722)    PRN Meds: Place/Maintain arterial line **AND** sodium chloride, albuterol, docusate, fentaNYL (SUBLIMAZE) injection, midazolam, polyethylene glycol, sodium chloride flush  Physical Exam         General: Intubated, ill-appearing, unresponsive  HEENT: No bruits, no goiter, no JVD, ET tube in place Heart: Regular rate and rhythm. No murmur appreciated. Lungs: Good air movement, clear Abdomen: Soft, nontender, nondistended, positive bowel sounds.  Ext: No significant edema Skin: Warm and dry  Vital Signs: BP 104/67 (BP Location: Right Leg)   Pulse 81   Temp 98 F (36.7 C) (Axillary)   Resp (!) 35   Ht _0  (1.626 m)   Wt 72 kg   SpO2 (!) 81%   BMI 27.25 kg/m  SpO2: SpO2: (!) 81 % O2 Device: O2 Device: Ventilator O2 Flow Rate: O2 Flow Rate (L/min): 15 L/min  Intake/output summary:   Intake/Output Summary (Last 24 hours) at 09/04/2019 1754 Last data filed at 09/04/2019 1600 Gross per 24 hour  Intake 6435.49 ml  Output 275 ml  Net 6160.49 ml   LBM: Last BM Date: 09/02/19 Baseline Weight: Weight: 56.7 kg Most recent weight: Weight: 72 kg        Palliative Assessment/Data:      Patient Active Problem List   Diagnosis Date Noted  . Septic shock ([place])   . Pressure injury of skin 08/31/2019  . Palliative care by specialist   . Goals of care, counseling/discussion   . DNR (do not resuscitate)   . FTT (failure to thrive) in adult   . Severe protein-calorie malnutrition ([place])   . Muscular deconditioning   . Acute respiratory failure requiring reintubation ([place])   . AMS (altered mental status) 08/04/2019  . Shock circulatory ([place])   . Diabetic hypoglycemia ([place]) 04/09/2019  . CAP (community acquired pneumonia) 04/09/2019  . Diabetic ulcer of right foot ([place]) - dorsum of right foot 04/09/2019  . Hypothermia 03/10/2019  . Sepsis with acute organ dysfunction ([place]) 03/10/2019  . Seizure ([place]) 02/26/2019  . History of hepatitis B virus infection conferring immunity 03/22/2018  . Alcohol use disorder, severe, dependence ([place]) 12/29/2017  . Malnutrition of moderate degree 03/30/2017  . Hypoglycemia due to insulin 03/26/2017  . Acute metabolic encephalopathy 50/93/2671  . Other constipation 10/16/2016  . Recurrent falls 07/07/2016  . Wernicke's encephalopathy   . Bipolar disorder ([place]) 06/01/2016  . Dry skin 01/18/2016  . Peripheral vascular disease ([place]) 11/15/2015  . Hyperglycemia 09/02/2015  . Tobacco abuse 09/02/2015  . Tricompartment osteoarthritis of both knees 06/15/2015  . Vitamin D deficiency 03/12/2015  . Hyperlipidemia 02/17/2015  . Hypokalemia 12/05/2014  . Protein-calorie malnutrition, severe 11/18/2014  . Alcohol use   . Cervical arthritis 01/01/2014  . Health care maintenance 08/25/2011  . Chronic alcoholic pancreatitis ([place]) 04/20/2011  . Type 2 diabetes mellitus with peripheral neuropathy ([place]) 12/09/2010  . HTN (hypertension) 12/09/2010    Palliative Care Assessment & Plan   Patient Profile: 64 year old male with uncontrolled diabetes admitted with seizure activity, altered mental status,  hyperglycemia and hyperkalemia.  He was intubated earlier this admission but was eventually extubated.  He decompensated again and was transition back to ICU where he was reintubated for  respiratory distress and started on vasopressors for hypotension.  Palliative care consulted for goals of care.  Recommendations/Plan:  I spoke with his sister today regarding care plan moving forward.  She desires more time to see if he may improve and be able to be extubated again.  She was not fully engaged in conversation when I spoke with her, and will plan on further palliative care follow-up this weekend to continue discussion.  Code Status:    Code Status Orders  (From admission, onward)         Start     Ordered   09/02/19 0849  Full code  Continuous        09/02/19 0848        Code Status History    Date Active Date Inactive Code Status Order ID Comments User Context   08/30/2019 1340 09/02/2019 0848 DNR [id number]  Rosezella Rumpf, NP Inpatient   07/28/2019 1046 08/30/2019 1340 Full Code [id number]  Minor, Grace Bushy, NP ED   04/10/2019 0059 04/10/2019 1828 Full Code [id number]  Kristopher Oppenheim, DO Inpatient   04/09/2019 2022 04/10/2019 0059 Full Code [id number]  Kristopher Oppenheim, DO ED   03/29/2019 1642 03/31/2019 2025 Full Code [id number]  Karmen Bongo, MD ED   03/10/2019 1816 03/19/2019 1850 Full Code [id number]  Modena Jansky, MD Inpatient   02/26/2019 [place] 02/27/2019 1714 Full Code [id number]  Erick Colace, NP ED   02/26/2019 1543 02/26/2019 1548 Full Code [id number]  Erick Colace, NP ED   12/29/2017 0329 12/29/2017 1831 Full Code [id number]  Fatima Blank, MD ED   03/26/2017 1518 03/30/2017 1924 Full Code [id number]  Phillips Grout, MD ED   09/06/2016 0338 09/06/2016 1449 Full Code [id number]  Muthersbaugh, Gwenlyn Perking ED   06/18/2016 1946 06/22/2016 1849 Full Code [id number]  Norman Herrlich, MD ED   06/09/2016 0929 06/13/2016 2235 Full Code [id number]  Juliet Rude, MD Inpatient   06/05/2016 1207 06/08/2016  1812 Full Code [id number]  Collier Salina, MD Inpatient   06/05/2016 0720 06/05/2016 1136 Full Code [id number]  Riccardo Dubin, MD ED   05/31/2016 1617 06/05/2016 0501 Full Code [id number]  Kerrie Buffalo, NP Inpatient   05/31/2016 0547 05/31/2016 1614 Full Code [id number]  Muthersbaugh, Gwenlyn Perking ED   09/02/2015 2149 09/03/2015 2112 Full Code [id number]  Roney Jaffe, MD Inpatient   12/03/2014 2243 12/10/2014 1526 Full Code [id number]  Merton Border, MD Inpatient   11/17/2014 1713 11/19/2014 1613 Full Code [id number]  Jones Bales, MD Inpatient   10/09/2014 0204 10/15/2014 1851 Full Code [id number]  Vickii Chafe, MD Inpatient   09/08/2014 1903 09/09/2014 1716 Full Code [id number]  Lucious Groves, DO Inpatient   Advance Care Planning Activity    Advance Directive Documentation     Most Recent Value  Type of Advance Directive Out of facility DNR (pink MOST or yellow form)  Pre-existing out of facility DNR order (yellow form or pink MOST form) --  "MOST" Form in Place? --       Prognosis:   Guarded  Discharge Planning:  To Be Determined  Care plan was discussed with Sister  Thank you for allowing the Palliative Medicine Team to assist in the care of this patient.   Total Time 25 Prolonged Time Billed No      Greater than 50%  of this time was spent counseling and coordinating care related to the above assessment and plan.  Micheline Rough, MD  Please contact  Palliative Medicine Team phone at [phone] for questions and concerns.      

## 2019-09-04 NOTE — Progress Notes (Signed)
CRITICAL VALUE ALERT  Critical Value:  Platelet 12  Date & Time Notied:  09/04/2019 0400  Provider Notified: Oletta Darter, MD   Orders Received/Actions taken: MD aware. Awaiting orders. Will continue to monitor closely.   Antonieta Pert, RN  09/04/2019 0400

## 2019-09-04 NOTE — Progress Notes (Signed)
CRITICAL VALUE ALERT  Critical Value:  Lactic Acid > 11  Date & Time Notied:  09/04/2019 0355  Provider Notified: Oletta Darter, MD  Orders Received/Actions taken: MD aware. Awaiting orders. Will continue to monitor closely.   Antonieta Pert, RN  09/04/2019 [phone]

## 2019-09-04 NOTE — Progress Notes (Signed)
Upon arrival to patient's room, patient's sats were reading 70s then would read in the 90s.  Obtained ABG on ventilator settings of VT: 440, RR: 35, PEEP: 16, FIO2: 100%.  Decreased FIO2 to 70%.  Will continue to monitor.    Ref. Range 09/04/2019 14:59  Sample type Unknown ARTERIAL  pH, Arterial Latest Ref Range: 7.35 - 7.45  7.277 (L)  pCO2 arterial Latest Ref Range: 32 - 48 mmHg 38.8  pO2, Arterial Latest Ref Range: 83 - 108 mmHg 228 (H)  TCO2 Latest Ref Range: 22 - 32 mmol/L 19 (L)  Acid-base deficit Latest Ref Range: 0.0 - 2.0 mmol/L 8.0 (H)  Bicarbonate Latest Ref Range: 20.0 - 28.0 mmol/L 18.3 (L)  O2 Saturation Latest Units: % 100.0  Patient temperature Unknown 97.2 F  Collection site Unknown art line

## 2019-09-04 NOTE — Care Management (Signed)
TOC Team following patient for needs when patient medically improves. May he be blessed to do so. Ricki Miller, RN BSN Case Manager

## 2019-09-04 NOTE — Progress Notes (Signed)
CRITICAL VALUE ALERT  Critical Value:  WBC 55.8  Date & Time Notied:  09/04/2019 0400  Provider Notified: Oletta Darter, MD   Orders Received/Actions taken: MD aware. Awaiting orders. Will continue to monitor closely.   Antonieta Pert, RN  09/04/2019 0400

## 2019-09-04 NOTE — Progress Notes (Signed)
[facility] Progress Note Patient Name: PHU RECORD DOB: 1955/11/22 MRN: [id number]   Date of Service  09/04/2019  HPI/Events of Note  NG tube feed coming out of patient's mouth when rolled. Over, Patient also has anasarca and weeping skin all over, serum Albumin 1.2.  eICU Interventions  NG tube to LIS, Albumin 25 %  50 gm iv x 1.        [name] Allee Busk 09/04/2019, 9:50 PM

## 2019-09-04 NOTE — Progress Notes (Signed)
NAME:  Michael [name], MRN:  [id number], DOB:  Dec 11, 1955, LOS: 21 ADMISSION DATE:  08/20/2019, CONSULTATION DATE:  09/02/2019 REFERRING MD:  [place] Crane, CHIEF COMPLAINT:  AMS, acute respiratory distress   Brief History   64 year old male with history of uncontrolled insulin-dependent diabetes mellitus presented on 08/03/2019 with seizure activity, altered mental status, hyperglycemia and hypokalemia. Initially intubated and admitted to PCCM unil 08/26/2019 when transfer to hospitalist service.  On the morning of 09/02/2019, patient with altered mental status, increased respiratory rate and hypoxia requiring non-rebreather mask. Also noted to be significantly hypotensive for which vasopressors started. PCCM reconsulted and patient required transfer back to ICU where re-intubated for respiratory distress and also on vasopressors.   History of present illness   Michael [name] is a 64 year old male with history of bipolar disorder, schizophrenia, chronic pancreatitis, hepatitis B, and uncontrolled diabetes mellitus initially admitted on 7/22 with acute hypoxemic respiratory failure, aspiration pneumonia, septic shock secondary to aspiration pneumonia, HHS, seizure activity and encephalopathy requiring intubation and ICU admission. He was treated and extubated on 7/31 and transferred to the floors on 08/26/2019.   Past Medical History   Past Medical History:  Diagnosis Date   Anxiety    Arthritis    "joints; shoulders; feet" (06/08/2016)   Bipolar disorder ([place])    Daily headache    "7:30 - 8:00 q night" (06/08/2016)   Diabetic peripheral neuropathy (HCC)    GERD (gastroesophageal reflux disease)    History of hepatitis B virus infection conferring immunity 03/22/2018   Pancreatitis    Schizophrenia ([place])    Seizures ([place]) 01/2016 X 2   Type II diabetes mellitus ([place])    [place] [facility] Events   7/22 admitted for AMS, CBG>1400, seizure activity, aspiration PNA, VDRF and shock requiring  pressors 7/23 off pressors 7/24 urology placed foley  7/25 reintubated due to worsening hypoxia 7/27 Afib with RVR for which started on amiodarone 8/3 transferred out of ICU  8/7 poor nutrition status, palliative care consulted 8/9 recurrent hypoglycemia, hypoxia 8/10 transferred to ICU for respiratory distress and AMS 8/11 persistently hypoxic and hypotensive requiring multiple pressors   Consults:  PCCM  Procedures:  Lt IJ CVL 7/22>8/3 A line 7/25 > 8/3 ETT 7/25> 7/31 ETT 8/10>> Rt IJ CVL 8/10 >>  Significant Diagnostic Tests:   EEG 7/22 >> continuous slowing  CT head 7/22 >> atrophy, chronic small vessel ischemic changes, chronic thalamic lacunar infarcts  CT chest 7/22 >> multifocal pneumonia, changes of cirrhosis, small volume ascites, atherosclerosis  Echo 7/28 >> EF 60 to 65%, grade 1 DD  Micro Data:  Blood cx 7/22 > negative Urine cx 7/22> negative MRSA 7/22 negative Tracheal aspirate 7/29 > normal respiratory flora Blood cx 8/10 >> candida albicans and candida tropicalis    Antimicrobials:  Vancomycin 7/22, 8/10 >> Cefepime 7/22 > 7/25, 8/10 >> Ceftriaxone 7/25> 7/28 Eraxis 8/11 >>    Interim history/subjective:  Overnight, patient with persistent acidemia, recurrent hypoxia with increasing lactate and pressor requirement. Ventilator settings adjusted and pt started on albumin. Presors increased and D10W restarted for recurrent hypoglycemia.  Also started on bicarb infusion.   Objective   Blood pressure (!) 89/65, pulse 72, temperature 97.7 F (36.5 C), temperature source Oral, resp. rate (!) 31, height 5\' 4"  (1.626 m), weight 72 kg, SpO2 92 %. CVP:  [17 mmHg-25 mmHg] 19 mmHg  Vent Mode: PRVC FiO2 (%):  [100 %] 100 % Set Rate:  [32 bmp-35 bmp] 35 bmp  Vt Set:  [440 mL] 440 mL PEEP:  [16 cmH20] 16 cmH20 Plateau Pressure:  [26 cmH20-31 cmH20] 27 cmH20   Intake/Output Summary (Last 24 hours) at 09/04/2019 1002 Last data filed at 09/04/2019 0800 Gross  per 24 hour  Intake 7376.64 ml  Output 275 ml  Net 7101.64 ml   Filed Weights   09/01/19 0157 09/02/19 0450 09/03/19 0457  Weight: 59.7 kg 61.9 kg 72 kg    Examination: General: chronically ill appearing male, thin and frail, intubated, appears uncomfortable.  HENT: [place]/AT, PERRL, ET tube in place Lungs: clear to auscultation bilaterally Cardiovascular: RRR, S1 and S2 present, no m/r/g Abdomen: soft, nondistended, normoactive bowel sounds  Neuro: minimally responsive to verbal stimuli; PERRL, no apparent focal deficits  Skin: sacral decubitus ulcer with dressing in place; ulcer at tip of penis   Assessment & Plan:  Acute hypoxic respiratory failure suspected to be secondary to aspiration pneumonia:  Septic shock suspected secondary to aspiration pneumonia and candidemia:   BCID with candida albicans and candida tropicalis for which started on antifungal coverage. Respiratory culture with staph aureus and gram neg rods. Continues to require full ventilator support and maximum vasopressor support.  - Mechanical ventilation with TV 6-8cc/kg, goal SpO2 >90% - Broad spectrum antibiotic coverage and antifungal initiated this morning  - Continue fluid resuscitation and pressor support for persistent hypotension; albumin for third spacing  - VAP per protocol   Acute metabolic encephalopathy: Secondary to hypoxia and sepsis; currently minimally responsive without any sedation.  - Current RASS goal -2 to -3 while on vent   Anion gap metabolic acidosis Lactic acidosis:  Worsening lactic acidosis in setting of septic shock. He is on bicarb gtt. - Continue bicarb gtt at this time  - Repeat ABG in AM   Thrombocytopenia Plt count of 12. Hb is stable. No active bleeding at this time. Will continue to monitor  - Tranfuse for plt <10 or bleeding  - CBC monitoring  Hx of transient atrial fibrillation with RVR Diastolic congestive heart failure  - Currently normal sinus rhythm and hypovolemic  in setting of shock - Continue cardiac monitoring  Hx of poorly controlled diabetes mellitus  Hypoglycemia  HbA1c 11.1; multiple episodes of hypoglycemia; likely secondary to poor PO intake and decreased glycogen stores - CBG monitoring q4h - tube feeds per dietician - Will hold off insulin at this time given multiple hypoglycemic episodes  - D10W gtt for persistent hypoglycemia   Urinary retention secondary to phimosis - Foley in place, minimal urine output   Hx of seizure activity and bipolar disorder - Continue Keppra; holding abilify and neurontin   GOC: Discussed patient status and overall poor prognosis with sister, [name]. Family would like to continue with aggressive care at this time.   Best practice:  Diet: tube feeds Pain/Anxiety/Delirium protocol (if indicated): per protocol VAP protocol (if indicated): per protocol DVT prophylaxis: SCDs GI prophylaxis: Protonix Glucose control: Hypoglycemic Mobility: Bed rest Code Status: FULL  Family Communication: Patient's sister, [name], updated on patient status 8/12 Disposition: ICU  Critical care time: 35 minutes     Harvie Heck, MD Internal Medicine, PGY-2 09/04/19 10:02 AM Pager # [phone]

## 2019-09-04 NOTE — Progress Notes (Signed)
  [facility]    Date of Admission:  08/19/2019   Total days of antibiotics 3           ID: Michael [name] is a 64 y.o. male with critically ill with candidemia and pneumonia Active Problems:   AMS (altered mental status)   Shock circulatory (HCC)   Acute respiratory failure requiring reintubation (HCC)   Palliative care by specialist   Goals of care, counseling/discussion   DNR (do not resuscitate)   FTT (failure to thrive) in adult   Severe protein-calorie malnutrition (HCC)   Muscular deconditioning   Pressure injury of skin   Septic shock (HCC)    Subjective: Afebrile but WBC increased to 55.8K, remains on 2 pressors  Medications:  . chlorhexidine gluconate (MEDLINE KIT)  15 mL Mouth Rinse BID  . Chlorhexidine Gluconate Cloth  6 each Topical Q0600  . feeding supplement (PROSource TF)  45 mL Per Tube Daily  . ipratropium-albuterol  3 mL Nebulization Q6H  . mouth rinse  15 mL Mouth Rinse 10 times per day  . multivitamin with minerals  1 tablet Per Tube Daily  . nicotine  7 mg Transdermal Daily  . pantoprazole (PROTONIX) IV  40 mg Intravenous Q24H  . sodium chloride flush  10-40 mL Intracatheter Q12H    Objective: Vital signs in last 24 hours: Temp:  [97.2 F (36.2 C)-98 F (36.7 C)] 98 F (36.7 C) (08/12 1600) Pulse Rate:  [35-99] 81 (08/12 1615) Resp:  [28-37] 35 (08/12 1615) BP: (83-132)/(52-86) 104/67 (08/12 1600) SpO2:  [73 %-95 %] 81 % (08/12 1615) Arterial Line BP: (92-302)/(45-59) 126/54 (08/12 1615) FiO2 (%):  [70 %-100 %] 70 % (08/12 1600) Physical Exam  Constitutional: He is sedated. He appears well-developed and well-nourished. No distress.  HENT:  Mouth/Throat:  Secretions around OETT Cardiovascular: Normal rate, regular rhythm and normal heart sounds. Exam reveals no gallop and no friction rub.  No murmur heard.  Pulmonary/Chest: Effort normal and breath sounds normal. No respiratory distress. He has no wheezes.    Abdominal: Soft. Bowel sounds are normal.+distension. There is no tenderness.  Neck+ left triple lumen ,and left groin arterial line (no picc line_ Neurological: He is alert and oriented to person, place, and time.  Skin: Skin is warm and dry. No rash noted. No erythema.     Lab Results Recent Labs    09/03/19 0311 09/03/19 0432 09/04/19 0300 09/04/19 0300 09/04/19 0424 09/04/19 1459  WBC 33.0*  --  55.8*  --   --   --   HGB 8.2*   < > 7.3*   < > 7.5* 7.8*  HCT 27.0*   < > 24.0*   < > 22.0* 23.0*  NA 143   < > 142   < > 141 140  K 4.4   < > 4.9   < > 4.8 5.1  CL 111  --  107  --   --   --   CO2 18*  --  16*  --   --   --   BUN 15  --  21  --   --   --   CREATININE 1.00  --  1.52*  --   --   --    < > = values in this interval not displayed.   Liver Panel Recent Labs    09/03/19 0311 09/04/19 0300  PROT 3.9* 3.7*  ALBUMIN 1.3* 1.2*  AST 51* 314*  ALT 37   120*  ALKPHOS 404* 363*  BILITOT 0.9 2.1*    Microbiology: 8/10: mssa and klebsiella on trach cx 8/10 c.albicans and c.tropicalis Studies/Results: DG Chest Port 1 View  Result Date: 09/03/2019 CLINICAL DATA:  64 year old male respiratory failure.  Pancreatitis. EXAM: PORTABLE CHEST 1 VIEW COMPARISON:  Portable chest 09/02/2019 and earlier. FINDINGS: Portable AP semi upright view at 0323 hours. Endotracheal tube tip in satisfactory position between the clavicles and carina. Stable enteric tube looped in the stomach. Stable right IJ approach central line. Mildly lower lung volumes. Coarse and confluent bilateral upper lobe predominant airspace disease has not significantly changed. No superimposed pneumothorax or pleural effusion. Mediastinal contours remain within normal limits. IMPRESSION: 1. Endotracheal tube adjusted and tip in good position. Otherwise stable lines and tubes. 2. Mildly lower lung volumes, otherwise stable bilateral upper lobe predominant pneumonia. Electronically Signed   By: Genevie Ann M.D.   On:  09/03/2019 03:29     Assessment/Plan: Candidemia = continue on anidulafungin. Awaiting sensitivities to fluconazole  Critically illness, with severe lactic acidosis = continue with vasopressor support, pccm guiding care  Polymicrobial pneumonia = will place on unasyn  Overall prognosis is guarded  [facility] Cell: [phone] Pager: [phone]  09/04/2019, 5:02 PM

## 2019-09-04 NOTE — Progress Notes (Signed)
[facility] Progress Note Patient Name: AIREN STIEHL DOB: 06/08/1955 MRN: [id number]   Date of Service  09/04/2019  HPI/Events of Note  Lactic Acid > 11.0 - CVP = 17 and Hgb = 7.3.  eICU Interventions  Plan: 1. COOX STAT.     Intervention Category Major Interventions: Acid-Base disturbance - evaluation and management  Madolyn Ackroyd [place] 09/04/2019, 4:23 AM

## 2019-09-05 ENCOUNTER — Inpatient Hospital Stay (HOSPITAL_COMMUNITY): Payer: Medicare HMO

## 2019-09-05 DIAGNOSIS — Z66 Do not resuscitate: Secondary | ICD-10-CM | POA: Diagnosis not present

## 2019-09-05 DIAGNOSIS — J189 Pneumonia, unspecified organism: Secondary | ICD-10-CM | POA: Diagnosis not present

## 2019-09-05 DIAGNOSIS — B371 Pulmonary candidiasis: Secondary | ICD-10-CM | POA: Diagnosis not present

## 2019-09-05 DIAGNOSIS — R627 Adult failure to thrive: Secondary | ICD-10-CM

## 2019-09-05 DIAGNOSIS — J9600 Acute respiratory failure, unspecified whether with hypoxia or hypercapnia: Secondary | ICD-10-CM | POA: Diagnosis not present

## 2019-09-05 DIAGNOSIS — E872 Acidosis: Secondary | ICD-10-CM | POA: Diagnosis not present

## 2019-09-05 DIAGNOSIS — Z7189 Other specified counseling: Secondary | ICD-10-CM | POA: Diagnosis not present

## 2019-09-05 DIAGNOSIS — Z515 Encounter for palliative care: Secondary | ICD-10-CM

## 2019-09-05 DIAGNOSIS — R29898 Other symptoms and signs involving the musculoskeletal system: Secondary | ICD-10-CM

## 2019-09-05 DIAGNOSIS — E43 Unspecified severe protein-calorie malnutrition: Secondary | ICD-10-CM

## 2019-09-05 LAB — GLUCOSE, CAPILLARY
Glucose-Capillary: 165 mg/dL — ABNORMAL HIGH (ref 70–99)
Glucose-Capillary: 167 mg/dL — ABNORMAL HIGH (ref 70–99)
Glucose-Capillary: 178 mg/dL — ABNORMAL HIGH (ref 70–99)
Glucose-Capillary: 210 mg/dL — ABNORMAL HIGH (ref 70–99)
Glucose-Capillary: 214 mg/dL — ABNORMAL HIGH (ref 70–99)
Glucose-Capillary: 214 mg/dL — ABNORMAL HIGH (ref 70–99)
Glucose-Capillary: <10 mg/dL — CL (ref 70–99)

## 2019-09-05 LAB — POCT I-STAT 7, (LYTES, BLD GAS, ICA,H+H)
Acid-base deficit: 9 mmol/L — ABNORMAL HIGH (ref 0.0–2.0)
Bicarbonate: 16.6 mmol/L — ABNORMAL LOW (ref 20.0–28.0)
Calcium, Ion: 0.97 mmol/L — ABNORMAL LOW (ref 1.15–1.40)
HCT: 19 % — ABNORMAL LOW (ref 39.0–52.0)
Hemoglobin: 6.5 g/dL — CL (ref 13.0–17.0)
O2 Saturation: 99 %
Patient temperature: 97.9
Potassium: 5.2 mmol/L — ABNORMAL HIGH (ref 3.5–5.1)
Sodium: 139 mmol/L (ref 135–145)
TCO2: 18 mmol/L — ABNORMAL LOW (ref 22–32)
pCO2 arterial: 33.4 mmHg (ref 32.0–48.0)
pH, Arterial: 7.302 — ABNORMAL LOW (ref 7.350–7.450)
pO2, Arterial: 172 mmHg — ABNORMAL HIGH (ref 83.0–108.0)

## 2019-09-05 LAB — COMPREHENSIVE METABOLIC PANEL
ALT: 175 U/L — ABNORMAL HIGH (ref 0–44)
AST: 355 U/L — ABNORMAL HIGH (ref 15–41)
Albumin: 2.1 g/dL — ABNORMAL LOW (ref 3.5–5.0)
Alkaline Phosphatase: 369 U/L — ABNORMAL HIGH (ref 38–126)
Anion gap: 26 — ABNORMAL HIGH (ref 5–15)
BUN: 28 mg/dL — ABNORMAL HIGH (ref 8–23)
CO2: 15 mmol/L — ABNORMAL LOW (ref 22–32)
Calcium: 7.4 mg/dL — ABNORMAL LOW (ref 8.9–10.3)
Chloride: 101 mmol/L (ref 98–111)
Creatinine, Ser: 2.04 mg/dL — ABNORMAL HIGH (ref 0.61–1.24)
GFR calc Af Amer: 40 mL/min — ABNORMAL LOW (ref >60)
GFR calc non Af Amer: 34 mL/min — ABNORMAL LOW (ref >60)
Glucose, Bld: 176 mg/dL — ABNORMAL HIGH (ref 70–99)
Potassium: 5.4 mmol/L — ABNORMAL HIGH (ref 3.5–5.1)
Sodium: 142 mmol/L (ref 135–145)
Total Bilirubin: 3.7 mg/dL — ABNORMAL HIGH (ref 0.3–1.2)
Total Protein: 4.1 g/dL — ABNORMAL LOW (ref 6.5–8.1)

## 2019-09-05 LAB — CBC
HCT: 19.8 % — ABNORMAL LOW (ref 39.0–52.0)
HCT: 22.3 % — ABNORMAL LOW (ref 39.0–52.0)
Hemoglobin: 5.7 g/dL — CL (ref 13.0–17.0)
Hemoglobin: 7 g/dL — ABNORMAL LOW (ref 13.0–17.0)
MCH: 32 pg (ref 26.0–34.0)
MCH: 30.6 pg (ref 26.0–34.0)
MCHC: 28.8 g/dL — ABNORMAL LOW (ref 30.0–36.0)
MCHC: 31.4 g/dL (ref 30.0–36.0)
MCV: 111.2 fL — ABNORMAL HIGH (ref 80.0–100.0)
MCV: 97.4 fL (ref 80.0–100.0)
Platelets: 8 10*3/uL — CL (ref 150–400)
Platelets: 41 10*3/uL — ABNORMAL LOW (ref 150–400)
RBC: 1.78 MIL/uL — ABNORMAL LOW (ref 4.22–5.81)
RBC: 2.29 MIL/uL — ABNORMAL LOW (ref 4.22–5.81)
RDW: 22.2 % — ABNORMAL HIGH (ref 11.5–15.5)
RDW: 20.2 % — ABNORMAL HIGH (ref 11.5–15.5)
WBC: 41.7 10*3/uL — ABNORMAL HIGH (ref 4.0–10.5)
WBC: 26.9 10*3/uL — ABNORMAL HIGH (ref 4.0–10.5)
nRBC: 0.4 % — ABNORMAL HIGH (ref 0.0–0.2)
nRBC: 0.4 % — ABNORMAL HIGH (ref 0.0–0.2)

## 2019-09-05 LAB — PREPARE RBC (CROSSMATCH)

## 2019-09-05 LAB — LACTIC ACID, PLASMA: Lactic Acid, Venous: >11 mmol/L (ref 0.5–1.9)

## 2019-09-05 LAB — ABO/RH: ABO/RH(D): B POS

## 2019-09-05 MED ORDER — SODIUM CHLORIDE 0.9 % IV SOLN
100.0000 mg | INTRAVENOUS | Status: DC
  Administered 2019-09-06 – 2019-09-09 (×4): 100 mg via INTRAVENOUS
  Filled 2019-09-05 (×4): qty 100

## 2019-09-05 MED ORDER — INSULIN ASPART 100 UNIT/ML ~~LOC~~ SOLN
0.0000 [IU] | SUBCUTANEOUS | Status: DC
  Administered 2019-09-05 (×2): 7 [IU] via SUBCUTANEOUS
  Administered 2019-09-05 – 2019-09-06 (×2): 4 [IU] via SUBCUTANEOUS
  Administered 2019-09-06: 7 [IU] via SUBCUTANEOUS
  Administered 2019-09-06: 3 [IU] via SUBCUTANEOUS
  Administered 2019-09-06: 7 [IU] via SUBCUTANEOUS
  Administered 2019-09-06 – 2019-09-07 (×3): 4 [IU] via SUBCUTANEOUS
  Administered 2019-09-07: 11 [IU] via SUBCUTANEOUS
  Administered 2019-09-07 (×3): 7 [IU] via SUBCUTANEOUS
  Administered 2019-09-08: 3 [IU] via SUBCUTANEOUS
  Administered 2019-09-08: 4 [IU] via SUBCUTANEOUS
  Administered 2019-09-08: 3 [IU] via SUBCUTANEOUS
  Administered 2019-09-08 (×2): 4 [IU] via SUBCUTANEOUS
  Administered 2019-09-09 (×2): 3 [IU] via SUBCUTANEOUS

## 2019-09-05 MED ORDER — CALCIUM GLUCONATE-NACL 2-0.675 GM/100ML-% IV SOLN
2.0000 g | Freq: Once | INTRAVENOUS | Status: AC
  Administered 2019-09-05: 2000 mg via INTRAVENOUS
  Filled 2019-09-05: qty 100

## 2019-09-05 MED ORDER — SODIUM CHLORIDE 0.9% IV SOLUTION: Freq: Once | INTRAVENOUS | Status: AC

## 2019-09-05 MED ORDER — SODIUM ZIRCONIUM CYCLOSILICATE 5 G PO PACK
5.0000 g | PACK | Freq: Once | ORAL | Status: AC
  Administered 2019-09-05: 5 g
  Filled 2019-09-05: qty 1

## 2019-09-05 NOTE — Progress Notes (Signed)
[facility]    Date of Admission:  08/09/2019   Total days of antibiotics 4           ID: Michael [name] is a 64 y.o. male with critically ill with candidemia and pneumonia Active Problems:   AMS (altered mental status)   Shock circulatory ([name])   Acute respiratory failure requiring reintubation ([name])   Palliative care by specialist   Goals of care, counseling/discussion   DNR (do not resuscitate)   FTT (failure to thrive) in adult   Severe protein-calorie malnutrition ([name])   Muscular deconditioning   Pressure injury of skin   Septic shock ([name])    Subjective: Intubated.   Interval events -  Afebrile . WBC a bit better today @ 41.7K. PLT down to 8 and Hgb 5.4 - getting transfused now.    Medications:   chlorhexidine gluconate (MEDLINE KIT)  15 mL Mouth Rinse BID   Chlorhexidine Gluconate Cloth  6 each Topical Q0600   feeding supplement (PROSource TF)  45 mL Per Tube Daily   ipratropium-albuterol  3 mL Nebulization Q6H   mouth rinse  15 mL Mouth Rinse 10 times per day   multivitamin with minerals  1 tablet Per Tube Daily   nicotine  7 mg Transdermal Daily   pantoprazole (PROTONIX) IV  40 mg Intravenous Q24H   sodium chloride flush  10-40 mL Intracatheter Q12H    Objective: Vital signs in last 24 hours: Temp:  [97.1 F (36.2 C)-98 F (36.7 C)] 97.7 F (36.5 C) (08/13 0836) Pulse Rate:  [34-91] 66 (08/13 1000) Resp:  [31-37] 32 (08/13 1000) BP: (87-132)/(42-86) 111/66 (08/13 1000) SpO2:  [72 %-99 %] 97 % (08/13 1000) Arterial Line BP: (106-141)/(31-58) 129/50 (08/13 1000) FiO2 (%):  [50 %-100 %] 50 % (08/13 0800)  Physical Exam  Constitutional: He is sedated. He appears well-developed and well-nourished. No distress. Opens eyes occasionally  HENT: Mouth/Throat: OETT in place Cardiovascular: Normal rate, regular rhythm and normal heart sounds. Exam reveals no gallop and no friction rub.  No murmur heard.  Pulmonary/Chest:  Effort normal and breath sounds normal. No respiratory distress. He has no wheezes.  Abdominal: Soft. Bowel sounds are normal.+distension. There is no tenderness.  Ext: anasarca - left arm with PIV infusion appears concerning for infiltrate with cold swollen area, R groin arterial line  Neck+ left triple lumen Neurological: He is alert and oriented to person, place, and time.  Skin: Skin is warm and dry. No rash noted. No erythema.     Lab Results Recent Labs    09/04/19 0300 09/04/19 0424 09/05/19 0340 09/05/19 0400  WBC 55.8*  --   --  41.7*  HGB 7.3*   < > 6.5* 5.7*  HCT 24.0*   < > 19.0* 19.8*  NA 142   < > 139 142  K 4.9   < > 5.2* 5.4*  CL 107  --   --  101  CO2 16*  --   --  15*  BUN 21  --   --  28*  CREATININE 1.52*  --   --  2.04*   < > = values in this interval not displayed.   Liver Panel Recent Labs    09/04/19 0300 09/05/19 0400  PROT 3.7* 4.1*  ALBUMIN 1.2* 2.1*  AST 314* 355*  ALT 120* 175*  ALKPHOS 363* 369*  BILITOT 2.1* 3.7*    Microbiology: 8/10: mssa and klebsiella on trach cx 8/10  c.albicans and c.tropicalis  Studies/Results: DG Chest Port 1 View  Result Date: 09/05/2019 CLINICAL DATA:  Hypoxia EXAM: PORTABLE CHEST 1 VIEW COMPARISON:  September 03, 2019 FINDINGS: Endotracheal tube tip is 9 mm above the carina. Central catheter tip is in the superior vena cava near the cavoatrial junction. Nasogastric tube tip and side port are in the stomach. No pneumothorax. There remains patchy airspace opacity throughout the lungs bilaterally with partial clearing from the upper lobe since 1 day prior. No new opacity evident. Heart size is upper normal with pulmonary vascularity normal. No adenopathy. No bone lesions. IMPRESSION: Tube and catheter positions as described without pneumothorax. Note that the endotracheal tube tip is near the carina; it may be prudent to consider withdrawing endotracheal tube approximately 2.5-3 cm. There is patchy airspace opacity  throughout the lungs with partial clearing from the upper lobes compared to 1 day prior. No new opacity evident. Stable cardiac silhouette. Electronically Signed   By: Lowella Grip III M.D.   On: 09/05/2019 07:56     Assessment/Plan: Candidemia = continue on anidulafungin. Awaiting sensitivities to fluconazole. Unable to remove lines given critical illness and pressor needs. Repeat blood cultures today (ordered). Had a TTE 7/28 - no findings concerning at that time. May need to consider repeating if repeatedly isolated cultures.   Critically illness, with severe lactic acidosis = continue with vasopressor support, pccm guiding care  Polymicrobial pneumonia = continue unasyn   Overall prognosis is guarded   Janene Madeira, MSN, NP-C [facility] [email or web address] Pager: [phone] Office: [phone] [place]: [phone]

## 2019-09-05 NOTE — Progress Notes (Signed)
Lab attempted to get Blood cultures and were unsuccessful. Night shift lab was able to get one culture, but not the second. MD made aware and ordered to keep order placed.

## 2019-09-05 NOTE — Progress Notes (Signed)
CRITICAL VALUE ALERT  Critical Value:  Lactic Acid > 11  Date & Time Notied:  09/04/2019 2147  Provider Notified: Lucile Shutters, MD  Orders Received/Actions taken: MD aware. Awaiting orders. Will continue monitor closely.    Antonieta Pert, RN  09/04/2019 2147

## 2019-09-05 NOTE — Progress Notes (Signed)
CRITICAL VALUE ALERT  Critical Value:  Hemoglobin 5.7  Date & Time Notied:  09/05/2019 0530  Provider Notified: Lucile Shutters, MD  Orders Received/Actions taken: E-link notified. MD aware. Awaiting orders. Will continue to monitor.   Antonieta Pert, RN  09/05/2019 0530

## 2019-09-05 NOTE — Progress Notes (Signed)
[facility] Progress Note Patient Name: Michael [name] DOB: [birth year] MRN: [id number]   Date of Service  09/05/2019  HPI/Events of Note  Hemoglobin 5.7 gm, Platelet count 8 K  eICU Interventions  Order to transfuse 2 units PRBC and 1 platelet pack entered.        [name] [name] 09/05/2019, 6:32 AM

## 2019-09-05 NOTE — Progress Notes (Signed)
Follow up discussion with patient's sister, [name] regarding patient status and ongoing multi-organ insult. She reports that she discussed with her daughter and at this time, no escalation of care and patient to be made DNR. They would like to continue current management until Monday and will consider comfort care measures pending patient status on Monday 8/16.  Patient status changed to DNR at this time.

## 2019-09-05 NOTE — Progress Notes (Signed)
CRITICAL VALUE ALERT  Critical Value:  Platelet 8  Date & Time Notied:  09/05/2019 0530  Provider Notified: Lucile Shutters, MD   Orders Received/Actions taken: E-link notified. MD aware. Awaiting orders. Will continue to monitor.   Antonieta Pert, RN  09/05/2019 0530

## 2019-09-05 NOTE — Progress Notes (Signed)
NAME:  Michael [name], MRN:  [id number], DOB:  10/14/1955, LOS: 44 ADMISSION DATE:  07/24/2019, CONSULTATION DATE:  09/02/2019 REFERRING MD:  Nevada Crane, CHIEF COMPLAINT:  AMS, acute respiratory distress   Brief History   64 year old male with history of uncontrolled insulin-dependent diabetes mellitus presented on 07/27/2019 with seizure activity, altered mental status, hyperglycemia and hypokalemia. Initially intubated and admitted to PCCM unil 08/26/2019 when transfer to hospitalist service.  On the morning of 09/02/2019, patient with altered mental status, increased respiratory rate and hypoxia requiring non-rebreather mask. Also noted to be significantly hypotensive for which vasopressors started. PCCM reconsulted and patient required transfer back to ICU where re-intubated for respiratory distress and also on vasopressors.   History of present illness   Michael [name] is a 64 year old male with history of bipolar disorder, schizophrenia, chronic pancreatitis, hepatitis B, and uncontrolled diabetes mellitus initially admitted on 7/22 with acute hypoxemic respiratory failure, aspiration pneumonia, septic shock secondary to aspiration pneumonia, HHS, seizure activity and encephalopathy requiring intubation and ICU admission. He was treated and extubated on 7/31 and transferred to the floors on 08/26/2019.   Past Medical History   Past Medical History:  Diagnosis Date  . Anxiety   . Arthritis    "joints; shoulders; feet" (06/08/2016)  . Bipolar disorder ([place])   . Daily headache    "7:30 - 8:00 q night" (06/08/2016)  . Diabetic peripheral neuropathy ([place])   . GERD (gastroesophageal reflux disease)   . History of hepatitis B virus infection conferring immunity 03/22/2018  . Pancreatitis   . Schizophrenia ([place])   . Seizures ([place]) 01/2016 X 2  . Type II diabetes mellitus ([place])    Significant Hospital Events   7/22 admitted for AMS, CBG>1400, seizure activity, aspiration PNA, VDRF and shock requiring  pressors 7/23 off pressors 7/24 urology placed foley  7/25 reintubated due to worsening hypoxia 7/27 Afib with RVR for which started on amiodarone 8/3 transferred out of ICU  8/7 poor nutrition status, palliative care consulted 8/9 recurrent hypoglycemia, hypoxia 8/10 transferred to ICU for respiratory distress and AMS 8/11 persistently hypoxic and hypotensive requiring multiple pressors   Consults:  PCCM  Procedures:  Lt IJ CVL 7/22>8/3 A line 7/25 > 8/3 ETT 7/25> 7/31 ETT 8/10>> Rt IJ CVL 8/10 >>  Significant Diagnostic Tests:   EEG 7/22 >> continuous slowing  CT head 7/22 >> atrophy, chronic small vessel ischemic changes, chronic thalamic lacunar infarcts  CT chest 7/22 >> multifocal pneumonia, changes of cirrhosis, small volume ascites, atherosclerosis  Echo 7/28 >> EF 60 to 65%, grade 1 DD  Micro Data:  Blood cx 7/22 > negative Urine cx 7/22> negative MRSA 7/22 negative Tracheal aspirate 7/29 > staph aureus and klebsiella ornithinolytica Blood cx 8/10 >> candida albicans and candida tropicalis    Antimicrobials:  Vancomycin 7/22, 8/10 >>8/11 Cefepime 7/22 > 7/25, 8/10 >>8/12 Ceftriaxone 7/25> 7/28 Eraxis 8/11 >>  Unasyn 8/12>>   Interim history/subjective:  Patient remains heavily sedated and mechanically ventilated. Given 2u pRBC and platelets for acute anemia and worsening thrombocytopenia. Also given albumin.   Objective   Blood pressure (!) 98/56, pulse 63, temperature 97.9 F (36.6 C), temperature source Oral, resp. rate (!) 35, height 5\' 4"  (1.626 m), weight 72 kg, SpO2 96 %. CVP:  [17 mmHg-19 mmHg] 18 mmHg  Vent Mode: PRVC FiO2 (%):  [50 %-100 %] 50 % Set Rate:  [35 bmp] 35 bmp Vt Set:  [440 mL] 440 mL PEEP:  [12  cmH20-16 [place] Pressure:  [24 cmH20-32 cmH20] 24 cmH20   Intake/Output Summary (Last 24 hours) at 09/05/2019 0726 Last data filed at 09/05/2019 0600 Gross per 24 hour  Intake 6340.91 ml  Output 1025 ml  Net 5315.91  ml   Filed Weights   09/01/19 0157 09/02/19 0450 09/03/19 0457  Weight: 59.7 kg 61.9 kg 72 kg    Examination: General: critically ill appearing male, intubated, no acute distress   HENT: [place]/AT, PERRL, ET tube in place Lungs: diffuse bilateral rhonchi Cardiovascular: RRR, S1 and S2 present, no m/r/g Abdomen: distended, decreased bowel sounds Neuro: unresponsive to verbal or painful stimuli; PERRL Extremities: cool to touch; dry; diffusely edematous  Skin: sacral decubitus ulcer with dressing in place; ulcer at tip of penis   Assessment & Plan:  Acute hypoxic respiratory failure suspected to be secondary to aspiration pneumonia:  Septic shock suspected secondary to aspiration pneumonia and candidemia:   BCID with candida albicans and candida tropicalis for which started on antifungal coverage. Respiratory culture with staph aureus and Klebsiella spp. Continues to require full ventilator support and maximum vasopressor support.  - Mechanical ventilation with TV 6-8cc/kg, goal SpO2 >90% - Broad spectrum antibiotic coverage and antifungal  - Continue fluid resuscitation and pressor support for persistent hypotension; albumin for third spacing  - VAP per protocol   Acute metabolic encephalopathy: Secondary to hypoxia and sepsis; currently minimally responsive without any sedation.  - Current RASS goal 0 to -1   Acute renal failure: In setting of ongoing septic shock. Minimal urine output.  - Continue supportive treatments   Anion gap metabolic acidosis Lactic acidosis:  Worsening lactic acidosis in setting of septic shock and ongoing hypoperfusion. He is on bicarb gtt. - Continue bicarb gtt at this time  - Repeat ABG in AM   Thrombocytopenia Plt count of 8 and Hb 5.8 this AM for which given 2u pRBC and 1u platelets. No active bleeding at this time.  - Post transfusion CBC  Hx of transient atrial fibrillation with RVR Diastolic congestive heart failure  - Currently normal sinus  rhythm and hypovolemic in setting of shock - Continue cardiac monitoring  Hx of poorly controlled diabetes mellitus  Hypoglycemia  HbA1c 11.1; multiple episodes of hypoglycemia; likely secondary to poor PO intake and decreased glycogen stores - CBG monitoring q4h - tube feeds per dietician - D10W gtt for persistent hypoglycemia   Urinary retention secondary to phimosis - Foley in place, minimal urine output   Hx of seizure activity and bipolar disorder - Continue Keppra; holding abilify and neurontin   GOC: Discussed patient status and overall poor prognosis with multiorgan failure at this time with sister, [name]. Discussed recommendation for no further escalation of care. She reports she will think about this. Will call again today for further discussion.   Best practice:  Diet: tube feeds Pain/Anxiety/Delirium protocol (if indicated): per protocol VAP protocol (if indicated): per protocol DVT prophylaxis: SCDs GI prophylaxis: Protonix Glucose control: Hypoglycemic Mobility: Bed rest Code Status: FULL  Family Communication: Patient's sister, [name], updated on patient status 8/13 Disposition: ICU  Critical care time: 35 minutes     Harvie Heck, MD Internal Medicine, PGY-2 09/05/19 7:26 AM Pager # [phone]

## 2019-09-05 NOTE — Progress Notes (Signed)
Nutrition Follow-up  DOCUMENTATION CODES:   Severe malnutrition in context of chronic illness  INTERVENTION:   If plans to continue supportive care, recommend resume tube feeding via OG tube: Vital AF 1.2 increase to 60 ml/h (1440 ml per day) D/C Prosource TF 45 ml once daily  Provides 1728 kcal, 108 gm protein, 1168 ml free water daily  NUTRITION DIAGNOSIS:   Severe Malnutrition related to chronic illness (poorly controlled T2DM, pancreatitis) as evidenced by moderate fat depletion, moderate muscle depletion, severe muscle depletion, percent weight loss (10.4% weight loss in 4 months).  Ongoing   GOAL:   Patient will meet greater than or equal to 90% of their needs  Currently unmet, being addressed with TF   MONITOR:   Vent status, TF tolerance, Labs  REASON FOR ASSESSMENT:   Ventilator, Consult Enteral/tube feeding initiation and management  ASSESSMENT:   64 year old male who presented to the ED on 7/22 with respiratory distress after being found unresponsive on the floor by family. Pt had multiple seizures in the field. PMH of bipolar disorder, T2DM (poorly compliant with insulin regimen), GERD, pancreatitis, schizophrenia, seizures. Pt required intubation for airway protection.  Discussed patient in ICU rounds and with RN today.  OG tube in place. TF is currently on hold due to suspected aspiration. Plans to remain on hold for now. MD to discuss potential for no further escalation of care with family, given multiorgan failure and poor prognosis.  Patient is currently intubated on ventilator support MV: 16.2 L/min Temp (24hrs), Avg:97.6 F (36.4 C), Min:97.1 F (36.2 C), Max:98 F (36.7 C)   Labs reviewed. Potassium 5.4, Lactic acid >11, Alk phos 369, AST 355, ionized calcium 0.97 CBG: 178, <10, 214  Medications reviewed and include levophed, vasopressin, potassium chloride.  Currently 72 kg up from 56.7 kg on admission I/O +31.3 L since admission  Diet  Order:   Diet Order            Diet NPO time specified  Diet effective now                 EDUCATION NEEDS:   No education needs have been identified at this time  Skin:  Skin Assessment: Skin Integrity Issues: Skin Integrity Issues:: Other (Comment), Stage II Stage II: sacrum Other: skin tear to bilateral arms  Last BM:  8/10  Height:   Ht Readings from Last 1 Encounters:  08/31/19 _0  (1.626 m)    Weight:   Wt Readings from Last 1 Encounters:  09/03/19 72 kg    Ideal Body Weight:  59.1 kg  BMI:  Body mass index is 27.25 kg/m.  Estimated Nutritional Needs:   Kcal:  1705  Protein:  105-120 grams  Fluid:  > 1.8 L    Lucas Mallow, RD, LDN, CNSC Please refer to Amion for contact information.

## 2019-09-06 DIAGNOSIS — R627 Adult failure to thrive: Secondary | ICD-10-CM | POA: Diagnosis not present

## 2019-09-06 DIAGNOSIS — J9600 Acute respiratory failure, unspecified whether with hypoxia or hypercapnia: Secondary | ICD-10-CM | POA: Diagnosis not present

## 2019-09-06 DIAGNOSIS — Z7189 Other specified counseling: Secondary | ICD-10-CM | POA: Diagnosis not present

## 2019-09-06 DIAGNOSIS — Z66 Do not resuscitate: Secondary | ICD-10-CM | POA: Diagnosis not present

## 2019-09-06 LAB — CULTURE, BLOOD (ROUTINE X 2): Special Requests: ADEQUATE

## 2019-09-06 LAB — COMPREHENSIVE METABOLIC PANEL
ALT: 194 U/L — ABNORMAL HIGH (ref 0–44)
AST: 280 U/L — ABNORMAL HIGH (ref 15–41)
Albumin: 1.5 g/dL — ABNORMAL LOW (ref 3.5–5.0)
Alkaline Phosphatase: 432 U/L — ABNORMAL HIGH (ref 38–126)
Anion gap: 18 — ABNORMAL HIGH (ref 5–15)
BUN: 33 mg/dL — ABNORMAL HIGH (ref 8–23)
CO2: 21 mmol/L — ABNORMAL LOW (ref 22–32)
Calcium: 7.3 mg/dL — ABNORMAL LOW (ref 8.9–10.3)
Chloride: 99 mmol/L (ref 98–111)
Creatinine, Ser: 2.01 mg/dL — ABNORMAL HIGH (ref 0.61–1.24)
GFR calc Af Amer: 40 mL/min — ABNORMAL LOW (ref >60)
GFR calc non Af Amer: 35 mL/min — ABNORMAL LOW (ref >60)
Glucose, Bld: 249 mg/dL — ABNORMAL HIGH (ref 70–99)
Potassium: 4 mmol/L (ref 3.5–5.1)
Sodium: 138 mmol/L (ref 135–145)
Total Bilirubin: 5.6 mg/dL — ABNORMAL HIGH (ref 0.3–1.2)
Total Protein: 4 g/dL — ABNORMAL LOW (ref 6.5–8.1)

## 2019-09-06 LAB — BPAM PLATELET PHERESIS
Blood Product Expiration Date: 202108142359
ISSUE DATE / TIME: 202108131253
Unit Type and Rh: 6200

## 2019-09-06 LAB — CBC
HCT: 27.9 % — ABNORMAL LOW (ref 39.0–52.0)
Hemoglobin: 9.2 g/dL — ABNORMAL LOW (ref 13.0–17.0)
MCH: 30.5 pg (ref 26.0–34.0)
MCHC: 33 g/dL (ref 30.0–36.0)
MCV: 92.4 fL (ref 80.0–100.0)
Platelets: 20 10*3/uL — CL (ref 150–400)
RBC: 3.02 MIL/uL — ABNORMAL LOW (ref 4.22–5.81)
RDW: 19.9 % — ABNORMAL HIGH (ref 11.5–15.5)
WBC: 26.1 10*3/uL — ABNORMAL HIGH (ref 4.0–10.5)
nRBC: 0.3 % — ABNORMAL HIGH (ref 0.0–0.2)

## 2019-09-06 LAB — POCT I-STAT 7, (LYTES, BLD GAS, ICA,H+H)
Acid-Base Excess: 2 mmol/L (ref 0.0–2.0)
Acid-Base Excess: 7 mmol/L — ABNORMAL HIGH (ref 0.0–2.0)
Bicarbonate: 25.3 mmol/L (ref 20.0–28.0)
Bicarbonate: 29.8 mmol/L — ABNORMAL HIGH (ref 20.0–28.0)
Calcium, Ion: 0.98 mmol/L — ABNORMAL LOW (ref 1.15–1.40)
Calcium, Ion: 0.99 mmol/L — ABNORMAL LOW (ref 1.15–1.40)
HCT: 28 % — ABNORMAL LOW (ref 39.0–52.0)
HCT: 29 % — ABNORMAL LOW (ref 39.0–52.0)
Hemoglobin: 9.5 g/dL — ABNORMAL LOW (ref 13.0–17.0)
Hemoglobin: 9.9 g/dL — ABNORMAL LOW (ref 13.0–17.0)
O2 Saturation: 99 %
O2 Saturation: 100 %
Patient temperature: 97.5
Patient temperature: 97.6
Potassium: 4.1 mmol/L (ref 3.5–5.1)
Potassium: 3.4 mmol/L — ABNORMAL LOW (ref 3.5–5.1)
Sodium: 138 mmol/L (ref 135–145)
Sodium: 139 mmol/L (ref 135–145)
TCO2: 26 mmol/L (ref 22–32)
TCO2: 31 mmol/L (ref 22–32)
pCO2 arterial: 32.3 mmHg (ref 32.0–48.0)
pCO2 arterial: 32.3 mmHg (ref 32.0–48.0)
pH, Arterial: 7.499 — ABNORMAL HIGH (ref 7.350–7.450)
pH, Arterial: 7.57 — ABNORMAL HIGH (ref 7.350–7.450)
pO2, Arterial: 129 mmHg — ABNORMAL HIGH (ref 83.0–108.0)
pO2, Arterial: 391 mmHg — ABNORMAL HIGH (ref 83.0–108.0)

## 2019-09-06 LAB — BPAM RBC
Blood Product Expiration Date: 202109142359
Blood Product Expiration Date: 202109142359
ISSUE DATE / TIME: 202108130816
ISSUE DATE / TIME: 202108131053
Unit Type and Rh: 7300
Unit Type and Rh: 7300

## 2019-09-06 LAB — GLUCOSE, CAPILLARY
Glucose-Capillary: 219 mg/dL — ABNORMAL HIGH (ref 70–99)
Glucose-Capillary: 209 mg/dL — ABNORMAL HIGH (ref 70–99)
Glucose-Capillary: 183 mg/dL — ABNORMAL HIGH (ref 70–99)
Glucose-Capillary: 163 mg/dL — ABNORMAL HIGH (ref 70–99)
Glucose-Capillary: 142 mg/dL — ABNORMAL HIGH (ref 70–99)
Glucose-Capillary: 196 mg/dL — ABNORMAL HIGH (ref 70–99)

## 2019-09-06 LAB — TYPE AND SCREEN
ABO/RH(D): B POS
Antibody Screen: NEGATIVE
Unit division: 0
Unit division: 0

## 2019-09-06 LAB — PREPARE PLATELET PHERESIS: Unit division: 0

## 2019-09-06 NOTE — Progress Notes (Signed)
CRITICAL VALUE ALERT  Critical Value:  Plt 20  Date & Time Notied:  09/06/2019 0640  Provider Notified: Dr. Lucile Shutters via Dellia Nims, RN  Orders Received/Actions taken: awaiting new orders

## 2019-09-06 NOTE — Progress Notes (Signed)
NAME:  Michael [name], MRN:  [id number], DOB:  11-11-1955, LOS: 109 ADMISSION DATE:  08/13/2019, CONSULTATION DATE:  09/02/2019 REFERRING MD:  [place] Crane, CHIEF COMPLAINT:  AMS, acute respiratory distress   Brief History   64 year old male with history of uncontrolled insulin-dependent diabetes mellitus presented on 07/27/2019 with seizure activity, altered mental status, hyperglycemia and hypokalemia. Initially intubated and admitted to PCCM unil 08/26/2019 when transfer to hospitalist service.  On the morning of 09/02/2019, patient with altered mental status, increased respiratory rate and hypoxia requiring non-rebreather mask. Also noted to be significantly hypotensive for which vasopressors started. PCCM reconsulted and patient required transfer back to ICU where re-intubated for respiratory distress and also on vasopressors.   History of present illness   [name] is a 64 year old male with history of bipolar disorder, schizophrenia, chronic pancreatitis, hepatitis B, and uncontrolled diabetes mellitus initially admitted on 7/22 with acute hypoxemic respiratory failure, aspiration pneumonia, septic shock secondary to aspiration pneumonia, HHS, seizure activity and encephalopathy requiring intubation and ICU admission. He was treated and extubated on 7/31 and transferred to the floors on 08/26/2019.   Past Medical History   Past Medical History:  Diagnosis Date   Anxiety    Arthritis    "joints; shoulders; feet" (06/08/2016)   Bipolar disorder ([place])    Daily headache    "7:30 - 8:00 q night" (06/08/2016)   Diabetic peripheral neuropathy ([facility])    GERD (gastroesophageal reflux disease)    History of hepatitis B virus infection conferring immunity 03/22/2018   Pancreatitis    Schizophrenia ([place])    Seizures ([place]) 01/2016 X 2   Type II diabetes mellitus ([place])    [place] Hospital Events   7/22 admitted for AMS, CBG>1400, seizure activity, aspiration PNA, VDRF and shock requiring  pressors 7/23 off pressors 7/24 urology placed foley  7/25 reintubated due to worsening hypoxia 7/27 Afib with RVR for which started on amiodarone 8/3 transferred out of ICU  8/7 poor nutrition status, palliative care consulted 8/9 recurrent hypoglycemia, hypoxia 8/10 transferred to ICU for respiratory distress and AMS 8/11 persistently hypoxic and hypotensive requiring multiple pressors   Consults:  PCCM  Procedures:  Lt IJ CVL 7/22>8/3 A line 7/25 > 8/3 ETT 7/25> 7/31 ETT 8/10>> Rt IJ CVL 8/10 >>  Significant Diagnostic Tests:   EEG 7/22 >> continuous slowing  CT head 7/22 >> atrophy, chronic small vessel ischemic changes, chronic thalamic lacunar infarcts  CT chest 7/22 >> multifocal pneumonia, changes of cirrhosis, small volume ascites, atherosclerosis  Echo 7/28 >> EF 60 to 65%, grade 1 DD  Micro Data:  Blood cx 7/22 > negative Urine cx 7/22> negative MRSA 7/22 negative Tracheal aspirate 7/29 > staph aureus and klebsiella ornithinolytica Blood cx 8/10 >> candida albicans and candida tropicalis   Antimicrobials:  Vancomycin 7/22, 8/10 >>8/11 Cefepime 7/22 > 7/25, 8/10 >>8/12 Ceftriaxone 7/25> 7/28 Eraxis 8/11 >>  Unasyn 8/12>>   Interim history/subjective:  Patient remains heavily sedated and mechanically ventilated. Continues to require maximum pressor support to maintain MAP   Objective   Blood pressure 109/74, pulse 68, temperature (!) 97.4 F (36.3 C), temperature source Oral, resp. rate (!) 35, height 5\' 4"  (1.626 m), weight 77.9 kg, SpO2 95 %. CVP:  [16 mmHg-20 mmHg] 18 mmHg  Vent Mode: PRVC FiO2 (%):  [40 %-50 %] 50 % Set Rate:  [35 bmp] 35 bmp Vt Set:  [470 mL] 470 mL PEEP:  [10 cmH20-12 cmH20] 10 cmH20 Plateau Pressure:  [  30 cmH20-32 cmH20] 32 cmH20   Intake/Output Summary (Last 24 hours) at 09/06/2019 0730 Last data filed at 09/06/2019 0700 Gross per 24 hour  Intake 5346.16 ml  Output 535 ml  Net 4811.16 ml   Filed Weights   09/02/19  0450 09/03/19 0457 09/06/19 0500  Weight: 61.9 kg 72 kg 77.9 kg    Examination: General: critically ill appearing male, intubated, no acute distress   HENT: [place]/AT, PERRL, ET tube in place Lungs: diffuse bilateral rhonchi Cardiovascular: RRR, S1 and S2 present, no m/r/g Abdomen: distended, decreased bowel sounds Neuro: unresponsive to verbal or painful stimuli; PERRL Extremities: cool to touch; dry; diffusely edematous   Assessment & Plan:  Acute hypoxic respiratory failure suspected to be secondary to aspiration pneumonia:  Septic shock suspected secondary to aspiration pneumonia and candidemia:   BCID with candida albicans and candida tropicalis and resp culture with Staph auerus and Klebsiella sp. on antibiotic and antifungal coverage. Continues to require full ventilator support and maximum vasopressor support.  - Mechanical ventilation with TV 6-8cc/kg, goal SpO2 >90% - Broad spectrum antibiotic coverage and antifungal continued  - Continue pressor support for goal MAP >65; albumin for third spacing  - VAP per protocol   Acute metabolic encephalopathy: Secondary to hypoxia and sepsis; currently minimally responsive without any sedation.  - RASS goal 0 to -1   Acute renal failure: In setting of ongoing septic shock. Minimal urine output.  - Continue supportive treatments   Anion gap metabolic acidosis Lactic acidosis:  In setting of septic shock and ongoing hypoperfusion. Was on bicarb gtt which was discontinued this AM for pH 7.499.  - Continue to monitor   Thrombocytopenia Anemia of acute illness  In setting of septic shock; received 1u platelets and 2u pRBC on 8/13. Hb stable at 9.2 this AM and Plt 20. No active bleeding noted.  - CBC daily  - Transfuse for Hb <7 and Plt <10 or active bleeding   Hx of transient atrial fibrillation with RVR Diastolic congestive heart failure  - Currently normal sinus rhythm and hypovolemic in setting of shock - Continue cardiac  monitoring  Hx of poorly controlled diabetes mellitus  Hypoglycemia  HbA1c 11.1; multiple episodes of hypoglycemia; likely secondary to poor PO intake and decreased glycogen stores - CBG monitoring q4h - tube feeds per dietician - SSI   Urinary retention secondary to phimosis - Foley in place, minimal urine output   Hx of seizure activity and bipolar disorder - Continue Keppra; holding abilify and neurontin   GOC: No further escalation of care and pt DNR per conversation with family on 8/13. Further decisions for possible comfort care pending patient status on Monday 8/16.   Best practice:  Diet: tube feeds Pain/Anxiety/Delirium protocol (if indicated): per protocol VAP protocol (if indicated): per protocol DVT prophylaxis: SCDs GI prophylaxis: Protonix Glucose control: SSI  Mobility: Bed rest Code Status: FULL  Family Communication: Patient's sister, [name], updated on patient status 8/13 Disposition: ICU  Critical care time: 35 minutes     Harvie Heck, MD Internal Medicine, PGY-2 09/06/19 7:30 AM Pager # [phone]

## 2019-09-07 DIAGNOSIS — J9600 Acute respiratory failure, unspecified whether with hypoxia or hypercapnia: Secondary | ICD-10-CM | POA: Diagnosis not present

## 2019-09-07 DIAGNOSIS — R627 Adult failure to thrive: Secondary | ICD-10-CM | POA: Diagnosis not present

## 2019-09-07 DIAGNOSIS — Z66 Do not resuscitate: Secondary | ICD-10-CM | POA: Diagnosis not present

## 2019-09-07 DIAGNOSIS — Z7189 Other specified counseling: Secondary | ICD-10-CM | POA: Diagnosis not present

## 2019-09-07 LAB — GLUCOSE, CAPILLARY
Glucose-Capillary: 158 mg/dL — ABNORMAL HIGH (ref 70–99)
Glucose-Capillary: 203 mg/dL — ABNORMAL HIGH (ref 70–99)
Glucose-Capillary: 228 mg/dL — ABNORMAL HIGH (ref 70–99)
Glucose-Capillary: 249 mg/dL — ABNORMAL HIGH (ref 70–99)
Glucose-Capillary: 252 mg/dL — ABNORMAL HIGH (ref 70–99)
Glucose-Capillary: 147 mg/dL — ABNORMAL HIGH (ref 70–99)

## 2019-09-07 LAB — POCT I-STAT 7, (LYTES, BLD GAS, ICA,H+H)
Acid-Base Excess: 5 mmol/L — ABNORMAL HIGH (ref 0.0–2.0)
Bicarbonate: 27.2 mmol/L (ref 20.0–28.0)
Calcium, Ion: 0.99 mmol/L — ABNORMAL LOW (ref 1.15–1.40)
HCT: 29 % — ABNORMAL LOW (ref 39.0–52.0)
Hemoglobin: 9.9 g/dL — ABNORMAL LOW (ref 13.0–17.0)
O2 Saturation: 100 %
Patient temperature: 97.6
Potassium: 3.3 mmol/L — ABNORMAL LOW (ref 3.5–5.1)
Sodium: 139 mmol/L (ref 135–145)
TCO2: 28 mmol/L (ref 22–32)
pCO2 arterial: 29.8 mmHg — ABNORMAL LOW (ref 32.0–48.0)
pH, Arterial: 7.567 — ABNORMAL HIGH (ref 7.350–7.450)
pO2, Arterial: 302 mmHg — ABNORMAL HIGH (ref 83.0–108.0)

## 2019-09-07 LAB — BASIC METABOLIC PANEL
Anion gap: 16 — ABNORMAL HIGH (ref 5–15)
BUN: 39 mg/dL — ABNORMAL HIGH (ref 8–23)
CO2: 22 mmol/L (ref 22–32)
Calcium: 7.1 mg/dL — ABNORMAL LOW (ref 8.9–10.3)
Chloride: 99 mmol/L (ref 98–111)
Creatinine, Ser: 1.94 mg/dL — ABNORMAL HIGH (ref 0.61–1.24)
GFR calc Af Amer: 42 mL/min — ABNORMAL LOW (ref >60)
GFR calc non Af Amer: 36 mL/min — ABNORMAL LOW (ref >60)
Glucose, Bld: 196 mg/dL — ABNORMAL HIGH (ref 70–99)
Potassium: 3.3 mmol/L — ABNORMAL LOW (ref 3.5–5.1)
Sodium: 137 mmol/L (ref 135–145)

## 2019-09-07 LAB — CBC
HCT: 30 % — ABNORMAL LOW (ref 39.0–52.0)
Hemoglobin: 10.2 g/dL — ABNORMAL LOW (ref 13.0–17.0)
MCH: 30.8 pg (ref 26.0–34.0)
MCHC: 34 g/dL (ref 30.0–36.0)
MCV: 90.6 fL (ref 80.0–100.0)
Platelets: <5 10*3/uL — CL (ref 150–400)
RBC: 3.31 MIL/uL — ABNORMAL LOW (ref 4.22–5.81)
RDW: 19.3 % — ABNORMAL HIGH (ref 11.5–15.5)
WBC: 23.1 10*3/uL — ABNORMAL HIGH (ref 4.0–10.5)
nRBC: 1 % — ABNORMAL HIGH (ref 0.0–0.2)

## 2019-09-07 MED ORDER — AMIODARONE HCL IN DEXTROSE 360-4.14 MG/200ML-% IV SOLN
60.0000 mg/h | INTRAVENOUS | Status: AC
  Administered 2019-09-07: 60 mg/h via INTRAVENOUS
  Filled 2019-09-07: qty 200

## 2019-09-07 MED ORDER — AMIODARONE HCL IN DEXTROSE 360-4.14 MG/200ML-% IV SOLN
INTRAVENOUS | Status: AC
  Administered 2019-09-07: 150 mg via INTRAVENOUS
  Filled 2019-09-07: qty 200

## 2019-09-07 MED ORDER — AMIODARONE HCL IN DEXTROSE 360-4.14 MG/200ML-% IV SOLN
30.0000 mg/h | INTRAVENOUS | Status: DC
  Administered 2019-09-07 – 2019-09-09 (×3): 30 mg/h via INTRAVENOUS
  Filled 2019-09-07 (×3): qty 200

## 2019-09-07 MED ORDER — SODIUM CHLORIDE 0.9% IV SOLUTION: Freq: Once | INTRAVENOUS | Status: AC

## 2019-09-07 MED ORDER — AMIODARONE LOAD VIA INFUSION: 150.0000 mg | Freq: Once | INTRAVENOUS | Status: AC

## 2019-09-07 NOTE — Progress Notes (Signed)
  Amiodarone Drug - Drug Interaction Consult Note  Recommendations: No adjustments at this time (on keppra for antiepileptics, no interaction) - monitor vitals; monitor for future interactions and needs for medication adjustments.   Amiodarone is metabolized by the cytochrome P450 system and therefore has the potential to cause many drug interactions. Amiodarone has an average plasma half-life of 50 days (range 20 to 100 days).   There is potential for drug interactions to occur several weeks or months after stopping treatment and the onset of drug interactions may be slow after initiating amiodarone.   []  Statins: Increased risk of myopathy. Simvastatin- restrict dose to 20mg  daily. Other statins: counsel patients to report any muscle pain or weakness immediately.  []  Anticoagulants: Amiodarone can increase anticoagulant effect. Consider warfarin dose reduction. Patients should be monitored closely and the dose of anticoagulant altered accordingly, remembering that amiodarone levels take several weeks to stabilize.  [x]  Antiepileptics: Amiodarone can increase plasma concentration of phenytoin, the dose should be reduced. Note that small changes in phenytoin dose can result in large changes in levels. Monitor patient and counsel on signs of toxicity.  []  Beta blockers: increased risk of bradycardia, AV block and myocardial depression. Sotalol - avoid concomitant use.  []   Calcium channel blockers (diltiazem and verapamil): increased risk of bradycardia, AV block and myocardial depression.  []   Cyclosporine: Amiodarone increases levels of cyclosporine. Reduced dose of cyclosporine is recommended.  []  Digoxin dose should be halved when amiodarone is started.  []  Diuretics: increased risk of cardiotoxicity if hypokalemia occurs.  []  Oral hypoglycemic agents (glyburide, glipizide, glimepiride): increased risk of hypoglycemia. Patient's glucose levels should be monitored closely when initiating  amiodarone therapy.   []  Drugs that prolong the QT interval:  Torsades de pointes risk may be increased with concurrent use - avoid if possible.  Monitor QTc, also keep magnesium/potassium WNL if concurrent therapy can't be avoided. [place] Kitchen Antibiotics: e.g. fluoroquinolones, erythromycin. . Antiarrhythmics: e.g. quinidine, procainamide, disopyramide, sotalol. . Antipsychotics: e.g. phenothiazines, haloperidol.  . Lithium, tricyclic antidepressants, and methadone.  Thank You,   Antonietta Jewel, PharmD, [facility] Clinical Pharmacist  Phone: [phone] 09/07/2019 5:58 PM  Please check AMION for all [place] phone numbers After 10:00 PM, call [place] [phone]

## 2019-09-07 NOTE — Progress Notes (Signed)
NAME:  Michael [name], MRN:  [id number], DOB:  1955/12/17, LOS: 24 ADMISSION DATE:  08/22/2019, CONSULTATION DATE:  09/02/2019 REFERRING MD:  Nevada Crane, CHIEF COMPLAINT:  AMS, acute respiratory distress   Brief History   64 year old male with history of uncontrolled insulin-dependent diabetes mellitus presented on 07/28/2019 with seizure activity, altered mental status, hyperglycemia and hypokalemia. Initially intubated and admitted to PCCM unil 08/26/2019 when transfer to hospitalist service.  On the morning of 09/02/2019, patient with altered mental status, increased respiratory rate and hypoxia requiring non-rebreather mask. Also noted to be significantly hypotensive for which vasopressors started. PCCM reconsulted and patient required transfer back to ICU where re-intubated for respiratory distress and also on vasopressors.   History of present illness   Mr. Caidin Heidenreich is a 64 year old male with history of bipolar disorder, schizophrenia, chronic pancreatitis, hepatitis B, and uncontrolled diabetes mellitus initially admitted on 7/22 with acute hypoxemic respiratory failure, aspiration pneumonia, septic shock secondary to aspiration pneumonia, HHS, seizure activity and encephalopathy requiring intubation and ICU admission. He was treated and extubated on 7/31 and transferred to the floors on 08/26/2019.   Past Medical History   Past Medical History:  Diagnosis Date  . Anxiety   . Arthritis    "joints; shoulders; feet" (06/08/2016)  . Bipolar disorder ([place])   . Daily headache    "7:30 - 8:00 q night" (06/08/2016)  . Diabetic peripheral neuropathy ([place])   . GERD (gastroesophageal reflux disease)   . History of hepatitis B virus infection conferring immunity 03/22/2018  . Pancreatitis   . Schizophrenia ([place])   . Seizures ([place]) 01/2016 X 2  . Type II diabetes mellitus ([place])    Significant Hospital Events   7/22 admitted for AMS, CBG>1400, seizure activity, aspiration PNA, VDRF and shock requiring  pressors 7/23 off pressors 7/24 urology placed foley  7/25 reintubated due to worsening hypoxia 7/27 Afib with RVR for which started on amiodarone 8/3 transferred out of ICU  8/7 poor nutrition status, palliative care consulted 8/9 recurrent hypoglycemia, hypoxia 8/10 transferred to ICU for respiratory distress and AMS 8/11 persistently hypoxic and hypotensive requiring multiple pressors  8/15 still requiring pressors and vasopressin, Levophed  Consults:  PCCM Palliative care  Procedures:  Lt IJ CVL 7/22>8/3 A line 7/25 > 8/3 ETT 7/25> 7/31 ETT 8/10>> Rt IJ CVL 8/10 >>  Significant Diagnostic Tests:   EEG 7/22 >> continuous slowing  CT head 7/22 >> atrophy, chronic small vessel ischemic changes, chronic thalamic lacunar infarcts  CT chest 7/22 >> multifocal pneumonia, changes of cirrhosis, small volume ascites, atherosclerosis  Echo 7/28 >> EF 60 to 65%, grade 1 DD  Micro Data:  Blood cx 7/22 > negative Urine cx 7/22> negative MRSA 7/22 negative Tracheal aspirate 7/29 > staph aureus and klebsiella ornithinolytica Blood cx 8/10 >> candida albicans and candida tropicalis   Antimicrobials:  Vancomycin 7/22, 8/10 >>8/11 Cefepime 7/22 > 7/25, 8/10 >>8/12 Ceftriaxone 7/25> 7/28 Eraxis 8/11 >>  Unasyn 8/12>>   Interim history/subjective:  Patient remains heavily sedated and mechanically ventilated. Continues to require maximum pressor support to maintain MAP  No significant change overnight  Objective   Blood pressure (!) 109/58, pulse (!) 102, temperature 98.1 F (36.7 C), temperature source Axillary, resp. rate (!) 35, height 5\' 4"  (1.626 m), weight 84.6 kg, SpO2 100 %. CVP:  [11 mmHg] 11 mmHg  Vent Mode: PRVC FiO2 (%):  [40 %-50 %] 40 % Set Rate:  [35 bmp] 35 bmp Vt Set:  [  470 mL] 470 mL PEEP:  [8 cmH20-10 cmH20] 8 cmH20 Plateau Pressure:  [30 cmH20-31 cmH20] 30 cmH20   Intake/Output Summary (Last 24 hours) at 09/07/2019 1537 Last data filed at 09/07/2019  1145 Gross per 24 hour  Intake 2307.41 ml  Output 550 ml  Net 1757.41 ml   Filed Weights   09/03/19 0457 09/06/19 0500 09/07/19 0437  Weight: 72 kg 77.9 kg 84.6 kg    Examination: General: critically ill appearing male, not in distress HENT: Endotracheal tube in place, moist oral mucosa  lungs: diffuse bilateral rhonchi Cardiovascular: S1-S2 appreciated Abdomen: distended, decreased bowel sounds Neuro: unresponsive to verbal or painful stimuli; PERRL Extremities: cool to touch; dry; diffusely edematous   Assessment & Plan:  Acute hypoxic respiratory failure suspected to be secondary to aspiration pneumonia:  Septic shock suspected secondary to aspiration pneumonia and candidemia:   BCID with candida albicans and candida tropicalis and resp culture with Staph auerus and Klebsiella sp. on antibiotic and antifungal coverage. Continues to require full ventilator support and maximum vasopressor support.  - Mechanical ventilation with TV 6-8cc/kg, goal SpO2 >90% - Broad spectrum antibiotic coverage and antifungal continued -on Eraxis, Unasyn - Continue pressor support for goal MAP >65; albumin for third spacing  - VAP per protocol   Acute metabolic encephalopathy: Secondary to hypoxia and sepsis; currently minimally responsive without any sedation.  - RASS goal 0  Acute renal failure: In setting of ongoing septic shock. Minimal urine output.  - Continue supportive treatments   Anion gap metabolic acidosis Lactic acidosis:  In setting of septic shock and ongoing hypoperfusion. Was on bicarb gtt which was discontinued this AM for pH 7.499.  - Continue to monitor   Thrombocytopenia Anemia of acute illness  In setting of septic shock; received 1u platelets and 2u pRBC on 8/13. -Platelet decreased to 5, no bleeding -Transfuse platelets - CBC daily  - Transfuse for Hb <7 and Plt <10 or active bleeding   Hx of transient atrial fibrillation with RVR Diastolic congestive heart  failure  - Currently normal sinus rhythm and hypovolemic in setting of shock - Continue cardiac monitoring  Hx of poorly controlled diabetes mellitus  Hypoglycemia  HbA1c 11.1; multiple episodes of hypoglycemia; likely secondary to poor PO intake and decreased glycogen stores - CBG monitoring q4h - tube feeds per dietician - SSI   Urinary retention secondary to phimosis - Foley in place, minimal urine output   Hx of seizure activity and bipolar disorder - Continue Keppra; holding abilify and neurontin   GOC: No further escalation of care and pt DNR per conversation with family on 8/13. Further decisions for possible comfort care pending patient status on Monday 8/16.  Prognosis is poor  Best practice:  Diet: tube feeds Pain/Anxiety/Delirium protocol (if indicated): per protocol VAP protocol (if indicated): per protocol DVT prophylaxis: SCDs GI prophylaxis: Protonix Glucose control: SSI  Mobility: Bed rest Code Status: FULL  Family Communication: Patient's sister, [name], updated on patient status 8/13 Disposition: ICU  The patient is critically ill with multiple organ systems failure and requires high complexity decision making for assessment and support, frequent evaluation and titration of therapies, application of advanced monitoring technologies and extensive interpretation of multiple databases. Critical Care Time devoted to patient care services described in this note independent of APP/resident time (if applicable)  is 32 minutes.   Sherrilyn Rist MD [facility] Personal pager: [phone] If unanswered, please page CCM On-call: [phone]

## 2019-09-07 NOTE — Progress Notes (Signed)
Assisted tele visit to patient with mother.  Michael [name] Ann, RN  

## 2019-09-07 NOTE — Progress Notes (Signed)
Palliative Care Brief Progress Note  Chart reviewed.    Critical care has been in continued conversation with sister.  Now DNR with plan for continued discussion on Monday regarding goals and possible transition to comfort based upon his continued clinical course.  Our team is happy to participate in these conversations or is happy to step back if better rapport with family has been established by primary team.  Will touch base with primary team on Monday to see how we can best serve Michael [name] moving forward.  Micheline Rough, MD [facility] Team [phone]  NO CHARGE NOTE

## 2019-09-08 ENCOUNTER — Inpatient Hospital Stay (HOSPITAL_COMMUNITY): Payer: Medicare HMO

## 2019-09-08 DIAGNOSIS — R29898 Other symptoms and signs involving the musculoskeletal system: Secondary | ICD-10-CM | POA: Diagnosis not present

## 2019-09-08 DIAGNOSIS — J9600 Acute respiratory failure, unspecified whether with hypoxia or hypercapnia: Secondary | ICD-10-CM | POA: Diagnosis not present

## 2019-09-08 DIAGNOSIS — R4182 Altered mental status, unspecified: Secondary | ICD-10-CM | POA: Diagnosis not present

## 2019-09-08 DIAGNOSIS — R569 Unspecified convulsions: Secondary | ICD-10-CM | POA: Diagnosis not present

## 2019-09-08 DIAGNOSIS — Z515 Encounter for palliative care: Secondary | ICD-10-CM | POA: Diagnosis not present

## 2019-09-08 DIAGNOSIS — Z7189 Other specified counseling: Secondary | ICD-10-CM | POA: Diagnosis not present

## 2019-09-08 LAB — CBC
HCT: 29.3 % — ABNORMAL LOW (ref 39.0–52.0)
Hemoglobin: 10.2 g/dL — ABNORMAL LOW (ref 13.0–17.0)
MCH: 31.6 pg (ref 26.0–34.0)
MCHC: 34.8 g/dL (ref 30.0–36.0)
MCV: 90.7 fL (ref 80.0–100.0)
Platelets: 14 10*3/uL — CL (ref 150–400)
RBC: 3.23 MIL/uL — ABNORMAL LOW (ref 4.22–5.81)
RDW: 19.2 % — ABNORMAL HIGH (ref 11.5–15.5)
WBC: 25.8 10*3/uL — ABNORMAL HIGH (ref 4.0–10.5)
nRBC: 2 % — ABNORMAL HIGH (ref 0.0–0.2)

## 2019-09-08 LAB — BASIC METABOLIC PANEL
Anion gap: 13 (ref 5–15)
BUN: 45 mg/dL — ABNORMAL HIGH (ref 8–23)
CO2: 24 mmol/L (ref 22–32)
Calcium: 6.9 mg/dL — ABNORMAL LOW (ref 8.9–10.3)
Chloride: 101 mmol/L (ref 98–111)
Creatinine, Ser: 2.03 mg/dL — ABNORMAL HIGH (ref 0.61–1.24)
GFR calc Af Amer: 40 mL/min — ABNORMAL LOW (ref >60)
GFR calc non Af Amer: 34 mL/min — ABNORMAL LOW (ref >60)
Glucose, Bld: 131 mg/dL — ABNORMAL HIGH (ref 70–99)
Potassium: 3.3 mmol/L — ABNORMAL LOW (ref 3.5–5.1)
Sodium: 138 mmol/L (ref 135–145)

## 2019-09-08 LAB — BPAM PLATELET PHERESIS
Blood Product Expiration Date: 202108172359
ISSUE DATE / TIME: 202108151616
Unit Type and Rh: 6200

## 2019-09-08 LAB — GLUCOSE, CAPILLARY
Glucose-Capillary: 122 mg/dL — ABNORMAL HIGH (ref 70–99)
Glucose-Capillary: 159 mg/dL — ABNORMAL HIGH (ref 70–99)
Glucose-Capillary: 177 mg/dL — ABNORMAL HIGH (ref 70–99)
Glucose-Capillary: 152 mg/dL — ABNORMAL HIGH (ref 70–99)
Glucose-Capillary: 147 mg/dL — ABNORMAL HIGH (ref 70–99)

## 2019-09-08 LAB — PREPARE PLATELET PHERESIS: Unit division: 0

## 2019-09-08 MED ORDER — METOPROLOL TARTRATE 5 MG/5ML IV SOLN: 5.0000 mg | INTRAVENOUS | Status: DC | PRN

## 2019-09-08 MED ORDER — DOCUSATE SODIUM 50 MG/5ML PO LIQD
100.0000 mg | Freq: Two times a day (BID) | ORAL | Status: DC
  Administered 2019-09-08 – 2019-09-09 (×3): 100 mg
  Filled 2019-09-08 (×3): qty 10

## 2019-09-08 MED ORDER — POTASSIUM CHLORIDE 20 MEQ PO PACK: 40.0000 meq | PACK | Freq: Once | ORAL | Status: AC

## 2019-09-08 MED ORDER — POTASSIUM CHLORIDE 20 MEQ PO PACK
40.0000 meq | PACK | Freq: Two times a day (BID) | ORAL | Status: DC
  Filled 2019-09-08: qty 2

## 2019-09-08 MED ORDER — METOPROLOL TARTRATE 5 MG/5ML IV SOLN
INTRAVENOUS | Status: AC
  Filled 2019-09-08: qty 5

## 2019-09-08 MED ORDER — FREE WATER
200.0000 mL | Freq: Four times a day (QID) | Status: DC
  Administered 2019-09-08 – 2019-09-09 (×4): 200 mL

## 2019-09-08 MED ORDER — POLYETHYLENE GLYCOL 3350 17 G PO PACK
17.0000 g | PACK | Freq: Every day | ORAL | Status: DC
  Administered 2019-09-08 – 2019-09-09 (×2): 17 g
  Filled 2019-09-08 (×2): qty 1

## 2019-09-08 MED ORDER — PANTOPRAZOLE SODIUM 40 MG PO PACK
40.0000 mg | PACK | Freq: Every day | ORAL | Status: DC
  Administered 2019-09-08 – 2019-09-09 (×2): 40 mg
  Filled 2019-09-08 (×2): qty 20

## 2019-09-08 MED ORDER — POTASSIUM CHLORIDE 20 MEQ PO PACK
40.0000 meq | PACK | Freq: Two times a day (BID) | ORAL | Status: AC
  Administered 2019-09-08: 40 meq

## 2019-09-08 NOTE — Progress Notes (Signed)
[facility] ([facility]) Community Based Palliative Care       This patient has been referred to our palliative care services in the community.  [facility] will continue to follow for any discharge planning needs and to coordinate continuation of palliative care.   If you have questions or need assistance, please call [phone] or contact the hospital Liaison listed on AMION.     Thank you for the opportunity to participate in this patient's care.     Chrislyn King, BSN, RN [facility] Hospital Liaison   [phone]  

## 2019-09-08 NOTE — Progress Notes (Signed)
NAME:  Michael [name], MRN:  [id number], DOB:  [birth year], LOS: 53 ADMISSION DATE:  08/20/2019, CONSULTATION DATE:  09/02/2019 REFERRING MD:  Nevada Crane, CHIEF COMPLAINT:  AMS, acute respiratory distress   Brief History   64 year old male with history of uncontrolled insulin-dependent diabetes mellitus presented on 08/17/2019 with seizure activity, altered mental status, hyperglycemia and hypokalemia. Initially intubated and admitted to PCCM unil 08/26/2019 when transfer to hospitalist service.  On the morning of 09/02/2019, patient with altered mental status, increased respiratory rate and hypoxia requiring non-rebreather mask. Also noted to be significantly hypotensive for which vasopressors started. PCCM reconsulted and patient required transfer back to ICU where re-intubated for respiratory distress and also on vasopressors.   History of present illness   [name] is a 64 year old male with history of bipolar disorder, schizophrenia, chronic pancreatitis, hepatitis B, and uncontrolled diabetes mellitus initially admitted on 7/22 with acute hypoxemic respiratory failure, aspiration pneumonia, septic shock secondary to aspiration pneumonia, HHS, seizure activity and encephalopathy requiring intubation and ICU admission. He was treated and extubated on 7/31 and transferred to the floors on 08/26/2019.   Past Medical History   Past Medical History:  Diagnosis Date  . Anxiety   . Arthritis    "joints; shoulders; feet" (06/08/2016)  . Bipolar disorder ([place])   . Daily headache    "7:30 - 8:00 q night" (06/08/2016)  . Diabetic peripheral neuropathy ([place])   . GERD (gastroesophageal reflux disease)   . History of hepatitis B virus infection conferring immunity 03/22/2018  . Pancreatitis   . Schizophrenia ([place])   . Seizures ([place]) 01/2016 X 2  . Type II diabetes mellitus ([place])    Significant Hospital Events   7/22 admitted for AMS, CBG>1400, seizure activity, aspiration PNA, VDRF and shock requiring  pressors 7/23 off pressors 7/24 urology placed foley  7/25 reintubated due to worsening hypoxia 7/27 Afib with RVR for which started on amiodarone 8/3 transferred out of ICU  8/7 poor nutrition status, palliative care consulted 8/9 recurrent hypoglycemia, hypoxia 8/10 transferred to ICU for respiratory distress and AMS 8/11 persistently hypoxic and hypotensive requiring multiple pressors  8/15 still requiring pressors and vasopressin, Levophed 8/16 remains intubated with no sedating medication, appears to have questionable seizure activity this AM  Consults:  PCCM Palliative care  Procedures:  Lt IJ CVL 7/22>8/3 A line 7/25 > 8/3 ETT 7/25> 7/31 ETT 8/10>> Rt IJ CVL 8/10 >>  Significant Diagnostic Tests:   EEG 7/22 >> continuous slowing  CT head 7/22 >> atrophy, chronic small vessel ischemic changes, chronic thalamic lacunar infarcts  CT chest 7/22 >> multifocal pneumonia, changes of cirrhosis, small volume ascites, atherosclerosis  Echo 7/28 >> EF 60 to 65%, grade 1 DD  Micro Data:  Blood cx 7/22 > negative Urine cx 7/22> negative MRSA 7/22 negative Tracheal aspirate 7/29 > staph aureus and klebsiella ornithinolytica Blood cx 8/10 >> candida albicans and candida tropicalis  Blood cultures 8/13 > No growth at 3 days   Antimicrobials:  Vancomycin 7/22, 8/10 >>8/11 Cefepime 7/22 > 7/25, 8/10 >>8/12 Ceftriaxone 7/25> 7/28 Eraxis 8/11 >>  Unasyn 8/12>>  Interim history/subjective:  Patient remains intubated with need of pressor support. He is currently on no continuous sedation, PRN pushes only.   Concern for seizure activity this am, patient is seen with slight nystagmus and left upward gaze on sedation.   Objective   Blood pressure 114/77, pulse 89, temperature 97.8 F (36.6 C), temperature source Axillary, resp. rate Marland Kitchen)  35, height 5\' 4"  (1.626 m), weight 83.8 kg, SpO2 100 %.    Vent Mode: PRVC FiO2 (%):  [40 %] 40 % Set Rate:  [35 bmp] 35 bmp Vt Set:  [470  mL] 470 mL PEEP:  [8 cmH20] 8 cmH20 Plateau Pressure:  [29 cmH20-31 cmH20] 30 cmH20   Intake/Output Summary (Last 24 hours) at 09/08/2019 0730 Last data filed at 09/08/2019 0600 Gross per 24 hour  Intake 3785.92 ml  Output 445 ml  Net 3340.92 ml   Filed Weights   09/06/19 0500 09/07/19 0437 09/08/19 0415  Weight: 77.9 kg 84.6 kg 83.8 kg    Examination: General: Chronically ill appearing elderly male lying in bed on mechanical ventilation, in NAD HEENT: ETT, MM pink/moist, PERRL, sclera icteric Neuro: Unable to follow any commands on vent, no continuous sedating meds, Concern for seizure activity with slight nystagmus seen on physical stimulation CV: s1s2 regular rate and rhythm, no murmur, rubs, or gallops,  PULM:  Mechanical breath sounds, no added breath sounds, tolerating vent well,  GI: soft, bowel sounds active in all 4 quadrants, non-tender, non-distended, tolerating TF Extremities: warm/dry, 1+ pitting in all edema  Skin: no rashes or lesions  Assessment & Plan:  Acute hypoxic respiratory failure suspected to be secondary to aspiration pneumonia:  Septic shock suspected secondary to aspiration pneumonia and candidemia:   -BCID with candida albicans and candida tropicalis and resp culture with Staph auerus and Klebsiella sp. on antibiotic and antifungal coverage. Continues to require full ventilator support and maximum vasopressor support.  P: Continue ventilator support with lung protective strategies  Mentation currently precludes extubation  Wean PEEP and FiO2 for sats greater than 90%. Head of bed elevated 30 degrees. Plateau pressures less than 30 cm H20.  Follow intermittent chest x-ray and ABG.   Ensure adequate pulmonary hygiene  Follow cultures  VAP bundle in place  PAD protocol Continue broad spectrum antibiotics per ID Continue pressor support for MAP goal >76  Acute metabolic encephalopathy: -Secondary to hypoxia and sepsis; currently minimally responsive  without any sedation.  Hx of seizure activity and bipolar disorder -Concern for seizure activity am of 8/16, per chart review patient was seen with questionable seizure and started on Keppra on admission  P: Obtain spot EEG Continue to minimize sedation Supportive care Seizure precautions Continue Keppra   Acute renal failure: -In setting of ongoing septic shock. Minimal urine output.  P: Continue to display minimal urine output  Currently 26L+ Remains on pressor support  Follow renal function / urine output Trend Bmet Avoid nephrotoxins, ensure adequate renal perfusion  Enteral hydration   Anion gap metabolic acidosis, resolved  Lactic acidosis:  -In setting of septic shock and ongoing hypoperfusion. Was on bicarb gtt which was discontinued this AM for pH 7.499.  -Lactic acid remains above 11 since 8/13 P: Continue support measures as above Continue GOC discussion   Thrombocytopenia Anemia of acute illness  -In setting of septic shock; received 1u platelets and 2u pRBC on 8/13. P: Platelets remain 14 Continue to trend CBC Watch for active bleeding  Transfuse Hgb >7, Plt > 10  Hx of transient atrial fibrillation with RVR Diastolic congestive heart failure  - Currently normal sinus rhythm and hypovolemic in setting of shock P: Repeat EKG Continue Amio Continuous tele  Hx of poorly controlled diabetes mellitus  Hypoglycemia  -HbA1c 11.1; multiple episodes of hypoglycemia; likely secondary to poor PO intake and decreased glycogen stores P: Stop D10 drip  Continue CBG monitoring  Continue tube feed SSI as needed   Urinary retention secondary to phimosis - Foley in place, minimal urine output  P: Continue foley   GOC: No further escalation of care and pt DNR per conversation with family on 8/13. Further decisions for possible comfort care pending patient status on Monday 8/16.  Prognosis is poor  Best practice:  Diet: tube feeds Pain/Anxiety/Delirium  protocol (if indicated): per protocol VAP protocol (if indicated): per protocol DVT prophylaxis: SCDs GI prophylaxis: Protonix Glucose control: SSI  Mobility: Bed rest Code Status: FULL  Family Communication: Neligh discussion to be held later this morning  Disposition: ICU   CRITICAL CARE Performed by: Johnsie Cancel  Total critical care time: 40 minutes  Critical care time was exclusive of separately billable procedures and treating other patients.  Critical care was necessary to treat or prevent imminent or life-threatening deterioration.  Critical care was time spent personally by me on the following activities: development of treatment plan with patient and/or surrogate as well as nursing, discussions with consultants, evaluation of patient's response to treatment, examination of patient, obtaining history from patient or surrogate, ordering and performing treatments and interventions, ordering and review of laboratory studies, ordering and review of radiographic studies, pulse oximetry and re-evaluation of patient's condition.  Johnsie Cancel, NP-C [facility] Contact / Pager information can be found on Amion  09/08/2019, 8:25 AM

## 2019-09-08 NOTE — Procedures (Addendum)
Patient Name: Michael [name]  MRN: [id number]  Epilepsy Attending: Lora Havens  Referring Physician/Provider: Merlene Laughter, NP Date: 09/08/2019 Duration: 25.24 mins  Patient history: 64yo M with ams and seizure like activity. EEG to evaluate for seizure.   Level of alertness: comatose  AEDs during EEG study: LEV  Technical aspects: This EEG study was done with scalp electrodes positioned according to the 10-20 International system of electrode placement. Electrical activity was acquired at a sampling rate of 500Hz  and reviewed with a high frequency filter of 70Hz  and a low frequency filter of 1Hz . EEG data were recorded continuously and digitally stored.   Description: EEG showed continuous generalized 3 to 6 Hz theta-delta slowing. Triphasic waves, generalized, maximal bifrontal were also noted. Hyperventilation and photic stimulation were not performed.     ABNORMALITY -Continuous slow, generalized -Triphasic waves, generalized  IMPRESSION: This study is suggestive of severe diffuse encephalopathy, nonspecific etiology but likely related to sedation, toxic-metabolic etiology. No seizures or definite epileptiform discharges were seen throughout the recording.    Cohick Barbra Sarks

## 2019-09-08 NOTE — Progress Notes (Signed)
STAT EEG complete - results pending. ? ?

## 2019-09-09 ENCOUNTER — Inpatient Hospital Stay (HOSPITAL_COMMUNITY): Payer: Medicare HMO

## 2019-09-09 DIAGNOSIS — Z978 Presence of other specified devices: Secondary | ICD-10-CM

## 2019-09-09 DIAGNOSIS — R609 Edema, unspecified: Secondary | ICD-10-CM | POA: Diagnosis not present

## 2019-09-09 DIAGNOSIS — M7989 Other specified soft tissue disorders: Secondary | ICD-10-CM | POA: Diagnosis not present

## 2019-09-09 DIAGNOSIS — F1011 Alcohol abuse, in remission: Secondary | ICD-10-CM

## 2019-09-09 DIAGNOSIS — R509 Fever, unspecified: Secondary | ICD-10-CM

## 2019-09-09 LAB — CBC
HCT: 44.1 % (ref 39.0–52.0)
Hemoglobin: 15.1 g/dL (ref 13.0–17.0)
MCH: 30.9 pg (ref 26.0–34.0)
MCHC: 34.2 g/dL (ref 30.0–36.0)
MCV: 90.2 fL (ref 80.0–100.0)
Platelets: 10 10*3/uL — CL (ref 150–400)
RBC: 4.89 MIL/uL (ref 4.22–5.81)
RDW: 20.2 % — ABNORMAL HIGH (ref 11.5–15.5)
WBC: 14.4 10*3/uL — ABNORMAL HIGH (ref 4.0–10.5)
nRBC: 5.1 % — ABNORMAL HIGH (ref 0.0–0.2)

## 2019-09-09 LAB — POCT I-STAT 7, (LYTES, BLD GAS, ICA,H+H)
Acid-base deficit: 1 mmol/L (ref 0.0–2.0)
Bicarbonate: 21.5 mmol/L (ref 20.0–28.0)
Calcium, Ion: 0.96 mmol/L — ABNORMAL LOW (ref 1.15–1.40)
HCT: 29 % — ABNORMAL LOW (ref 39.0–52.0)
Hemoglobin: 9.9 g/dL — ABNORMAL LOW (ref 13.0–17.0)
O2 Saturation: 98 %
Potassium: 3.6 mmol/L (ref 3.5–5.1)
Sodium: 139 mmol/L (ref 135–145)
TCO2: 22 mmol/L (ref 22–32)
pCO2 arterial: 27.1 mmHg — ABNORMAL LOW (ref 32.0–48.0)
pH, Arterial: 7.506 — ABNORMAL HIGH (ref 7.350–7.450)
pO2, Arterial: 98 mmHg (ref 83.0–108.0)

## 2019-09-09 LAB — GLUCOSE, CAPILLARY
Glucose-Capillary: 103 mg/dL — ABNORMAL HIGH (ref 70–99)
Glucose-Capillary: 130 mg/dL — ABNORMAL HIGH (ref 70–99)
Glucose-Capillary: 95 mg/dL (ref 70–99)

## 2019-09-09 LAB — BASIC METABOLIC PANEL
Anion gap: 17 — ABNORMAL HIGH (ref 5–15)
BUN: 59 mg/dL — ABNORMAL HIGH (ref 8–23)
CO2: 18 mmol/L — ABNORMAL LOW (ref 22–32)
Calcium: 6.9 mg/dL — ABNORMAL LOW (ref 8.9–10.3)
Chloride: 102 mmol/L (ref 98–111)
Creatinine, Ser: 2.04 mg/dL — ABNORMAL HIGH (ref 0.61–1.24)
GFR calc Af Amer: 40 mL/min — ABNORMAL LOW (ref >60)
GFR calc non Af Amer: 34 mL/min — ABNORMAL LOW (ref >60)
Glucose, Bld: 106 mg/dL — ABNORMAL HIGH (ref 70–99)
Potassium: 3.5 mmol/L (ref 3.5–5.1)
Sodium: 137 mmol/L (ref 135–145)

## 2019-09-09 MED ORDER — GLYCOPYRROLATE 0.2 MG/ML IJ SOLN: 0.2000 mg | INTRAMUSCULAR | Status: DC | PRN

## 2019-09-09 MED ORDER — LEVETIRACETAM 100 MG/ML PO SOLN
500.0000 mg | Freq: Two times a day (BID) | ORAL | Status: DC
  Administered 2019-09-09: 500 mg
  Filled 2019-09-09 (×2): qty 5

## 2019-09-09 MED ORDER — POLYVINYL ALCOHOL 1.4 % OP SOLN
1.0000 [drp] | Freq: Four times a day (QID) | OPHTHALMIC | Status: DC | PRN
  Filled 2019-09-09: qty 15

## 2019-09-09 MED ORDER — DIPHENHYDRAMINE HCL 50 MG/ML IJ SOLN: 25.0000 mg | INTRAMUSCULAR | Status: DC | PRN

## 2019-09-09 MED ORDER — BISACODYL 10 MG RE SUPP: 10.0000 mg | Freq: Once | RECTAL | Status: DC

## 2019-09-09 MED ORDER — DEXTROSE 5 % IV SOLN: INTRAVENOUS | Status: DC

## 2019-09-09 MED ORDER — MORPHINE BOLUS VIA INFUSION
5.0000 mg | INTRAVENOUS | Status: DC | PRN
  Filled 2019-09-09: qty 5

## 2019-09-09 MED ORDER — ACETAMINOPHEN 325 MG PO TABS: 650.0000 mg | ORAL_TABLET | Freq: Four times a day (QID) | ORAL | Status: DC | PRN

## 2019-09-09 MED ORDER — ACETAMINOPHEN 650 MG RE SUPP: 650.0000 mg | Freq: Four times a day (QID) | RECTAL | Status: DC | PRN

## 2019-09-09 MED ORDER — MORPHINE SULFATE (PF) 2 MG/ML IV SOLN: 2.0000 mg | INTRAVENOUS | Status: DC | PRN

## 2019-09-09 MED ORDER — SENNA 8.6 MG PO TABS
1.0000 | ORAL_TABLET | Freq: Every day | ORAL | Status: DC
  Administered 2019-09-09: 8.6 mg
  Filled 2019-09-09: qty 1

## 2019-09-09 MED ORDER — GLYCOPYRROLATE 1 MG PO TABS: 1.0000 mg | ORAL_TABLET | ORAL | Status: DC | PRN

## 2019-09-09 MED ORDER — MORPHINE 100MG IN NS 100ML (1MG/ML) PREMIX INFUSION
0.0000 mg/h | INTRAVENOUS | Status: DC
  Administered 2019-09-09: 5 mg/h via INTRAVENOUS
  Filled 2019-09-09: qty 100

## 2019-09-10 LAB — CULTURE, BLOOD (ROUTINE X 2): Culture: NO GROWTH

## 2019-09-12 LAB — CULTURE, BLOOD (ROUTINE X 2): Special Requests: ADEQUATE

## 2019-09-12 LAB — ANTIFUNGAL SUSCEP, FLUCONAZOLE
Fluconazole Islt MIC: 8
Source of Sample: 182220

## 2019-09-24 NOTE — Progress Notes (Signed)
Lower extremity venous has been completed.   Preliminary results in CV Proc.   [name] Sep 28, 2019 11:26 AM

## 2019-09-24 NOTE — Progress Notes (Signed)
I spoke with patient's sister, [name], this morning at bedside.  She stated that patient is her older brother and he has been living with her since their mother passed.  They have 3 siblings but the oldest sister can not be here given personal medical problem.  Patient has no children.  [name] stated that she knew patient has diabetes but does not have blood pressure problem.  I explained to her the severity of patient's multiorgan dysfunction failure including septic shock secondary to candidemia and aspiration pneumonia requiring very high-dose pressor support.  I also explained that patient has very poor prognosis and he will not likely to recover.  [name] voiced understanding and expressed that patient would not want to be put on a ventilator long-term like this.  [name] decided to proceed with comfort care.  She stated that she would like to be here when we extubate the patient to see if patient is alert and can recognize her.  All questions were answered   Gaylan Gerold, DO Internal Medicine Residency My pager: [phone]

## 2019-09-24 NOTE — Discharge Summary (Addendum)
Name: Michael [name] MRN: [id number] DOB: 1955/02/15 64 y.o. PCP: [facility] [place]  Date of Admission: 08/15/2019  8:22 AM Date of Discharge:  September 28, 2019 Attending Physician: Lanier Clam, MD  Discharge Diagnosis: 1. Active Problems:   AMS (altered mental status)   Shock circulatory (HCC)   Acute respiratory failure requiring reintubation ([place])   Palliative care by specialist   Goals of care, counseling/discussion   DNR (do not resuscitate)   FTT (failure to thrive) in adult   Severe protein-calorie malnutrition ([place])   Muscular deconditioning   Pressure injury of skin, Stage 2 pressure injury to medial sacrum, POA   Septic shock ([place])   Disposition and follow-up:   [name] was discharged from [facility] in Expired condition.    Hospital Course Yvan Dority is a 65 year old male with past medical history of uncontrolled insulin-dependent diabetes mellitus, bipolar disorder, chronic pancreatitis and hepatitis B, who was admitted to the hospital on 08/06/2019 for grand mal seizure, HHS and acute metabolic encephalopathy.  He was given Versed, Keppra and intubated for airway protection.  Other hospital problems remarkable for atrial fibrillation, urinary retention due to phimosis, thrombocytopenia and severe protein calorie malnutrition.  He was extubated on 08/23/2019 and transferred to stepdown unit.  Patient was then re-intubated on 09/02/2019 due to increased work of breathing and hypotensive requiring vasopressors.  Patient was found to have septic shock secondary to candidemia with Candida albicans and Candida tropicalis.  Respiratory culture also showed staph aureus and Klebsiella.  Unasyn and Eraxis were started.  Patient required very high dose of Levophed and vasopressin to maintain MAP>65.  He also had an acute kidney injury secondary to poor perfusion.  Platelets was transfused given critically low level of platelets.  Given the severity of his  multiorgan failure, goals of care were discussed with his sister, [name].  After discussion with [name] on September 28, 2019 regarding goal of care, she decided to proceed with comfort care.  Morphine drip was started and patient was comfortable.  Patient was extubated at 12:40 PM without any complication.  Patient expired at 12:58 PM 8/17/121.    Signed: Gaylan Gerold, DO 09-28-2019, 1:18 PM   Pager: [phone]

## 2019-09-24 NOTE — Procedures (Signed)
Extubation Procedure Note  Patient Details:   Name: Michael [name] DOB: Dec 30, 1955 MRN: [id number]   Airway Documentation:    Vent end date: 2019-10-01 Vent end time: 1240   Evaluation  O2 sats: currently acceptable Complications: No apparent complications Patient did tolerate procedure well. Bilateral Breath Sounds: Clear, Diminished   No   One way extubation. Pt is comfortable. Family members at bedside.  Normajean Nash Oct 01, 2019, 12:42 PM

## 2019-09-24 NOTE — Progress Notes (Signed)
Chaplain responded to page to EOL offer support for patient's family.  Chaplain arrived just in time to pray with family before patient died. Family immediately departed.  De Burrs

## 2019-09-24 NOTE — Progress Notes (Signed)
NAME:  Michael [name], MRN:  [id number], DOB:  December 18, 1955, LOS: 59 ADMISSION DATE:  08/16/2019, CONSULTATION DATE:  09/02/2019 REFERRING MD:  [place] Crane, CHIEF COMPLAINT:  AMS, acute respiratory distress   Brief History   64 year old male with history of uncontrolled insulin-dependent diabetes mellitus presented on 07/26/2019 with seizure activity, altered mental status, hyperglycemia and hypokalemia. Initially intubated and admitted to PCCM unil 08/26/2019 when transfer to hospitalist service.  On the morning of 09/02/2019, patient with altered mental status, increased respiratory rate and hypoxia requiring non-rebreather mask. Also noted to be significantly hypotensive for which vasopressors started. PCCM reconsulted and patient required transfer back to ICU where re-intubated for respiratory distress and also on vasopressors.   History of present illness   [name] is a 64 year old male with history of bipolar disorder, schizophrenia, chronic pancreatitis, hepatitis B, and uncontrolled diabetes mellitus initially admitted on 7/22 with acute hypoxemic respiratory failure, aspiration pneumonia, septic shock secondary to aspiration pneumonia, HHS, seizure activity and encephalopathy requiring intubation and ICU admission. He was treated and extubated on 7/31 and transferred to the floors on 08/26/2019.   Past Medical History   Past Medical History:  Diagnosis Date   Anxiety    Arthritis    "joints; shoulders; feet" (06/08/2016)   Bipolar disorder ([place])    Daily headache    "7:30 - 8:00 q night" (06/08/2016)   Diabetic peripheral neuropathy (HCC)    GERD (gastroesophageal reflux disease)    History of hepatitis B virus infection conferring immunity 03/22/2018   Pancreatitis    Schizophrenia ([place])    Seizures ([place]) 01/2016 X 2   Type II diabetes mellitus ([place])    [facility] Events   7/22 admitted for AMS, CBG>1400, seizure activity, aspiration PNA, VDRF and shock requiring  pressors 7/23 off pressors 7/24 urology placed foley  7/25 reintubated due to worsening hypoxia 7/27 Afib with RVR for which started on amiodarone 8/3 transferred out of ICU  8/7 poor nutrition status, palliative care consulted 8/9 recurrent hypoglycemia, hypoxia 8/10 transferred to ICU for respiratory distress and AMS 8/11 persistently hypoxic and hypotensive requiring multiple pressors  8/15 still requiring pressors and vasopressin, Levophed 8/16 remains intubated with no sedating medication, appears to have questionable seizure activity this AM  Consults:  PCCM Palliative care  Procedures:  Lt IJ CVL 7/22>8/3 A line 7/25 > 8/3 ETT 7/25> 7/31 ETT 8/10>> Rt IJ CVL 8/10 >>  Significant Diagnostic Tests:   EEG 7/22 >> continuous slowing  CT head 7/22 >> atrophy, chronic small vessel ischemic changes, chronic thalamic lacunar infarcts  CT chest 7/22 >> multifocal pneumonia, changes of cirrhosis, small volume ascites, atherosclerosis  Echo 7/28 >> EF 60 to 65%, grade 1 DD  Micro Data:  Blood cx 7/22 > negative Urine cx 7/22> negative MRSA 7/22 negative Tracheal aspirate 8/10 > staph aureus and klebsiella ornithinolytica Blood cx 8/10 >> candida albicans and candida tropicalis  Blood cultures 8/13 > No growth at 4 days   Antimicrobials:  Vancomycin 7/22, 8/10 >>8/11 Cefepime 7/22 > 7/25, 8/10 >>8/12 Ceftriaxone 7/25> 7/28 Eraxis 8/11 >>  Unasyn 8/12>>  Interim history/subjective:  No acute event overnight  Patient is seen at bedside today.  He is opening his eyes to verbal and physical stimulation but no purposeful movement.  Per nurse his sister is considering comfort care.  Will call sister to continue conversation regarding goals of care and comfort care.  Objective   Blood pressure (!) 89/72, pulse 79,  temperature 98.3 F (36.8 C), temperature source Oral, resp. rate (!) 35, height 5\' 4"  (1.626 m), weight 79 kg, SpO2 100 %.    Vent Mode: PRVC FiO2 (%):  [40  %] 40 % Set Rate:  [35 bmp] 35 bmp Vt Set:  [470 mL] 470 mL PEEP:  [8 cmH20] 8 cmH20 Plateau Pressure:  [27 cmH20-29 cmH20] 27 cmH20   Intake/Output Summary (Last 24 hours) at 09-15-2019 0800 Last data filed at 15-Sep-2019 0300 Gross per 24 hour  Intake 2839.66 ml  Output 390 ml  Net 2449.66 ml   Filed Weights   09/07/19 0437 09/08/19 0415 09-15-19 0500  Weight: 84.6 kg 83.8 kg 79 kg    Examination: Physical Exam Constitutional:      General: He is not in acute distress.    Comments: Is alert and responsive to verbal stimulation  HENT:     Head: Normocephalic.  Eyes:     General: Scleral icterus present.        Right eye: No discharge.        Left eye: No discharge.     Pupils: Pupils are equal, round, and reactive to light.  Cardiovascular:     Rate and Rhythm: Normal rate and regular rhythm.     Heart sounds: No murmur heard.   Pulmonary:     Effort: No respiratory distress.     Breath sounds: Normal breath sounds.  Abdominal:     Palpations: Abdomen is soft.  Musculoskeletal:     Cervical back: Normal range of motion.     Right lower leg: Edema present.     Left lower leg: Edema present.     Comments: -Generalized edema +2  Skin:    Coloration: Skin is not jaundiced.  Neurological:     Comments: Awake, alert, responsive to verbal stimulation No purposeful movements PERRLA     Assessment & Plan:  Acute hypoxic respiratory failure suspected secondary to aspiration pneumonia Septic shock secondary to aspiration pneumonia and candidemia Acute metabolic encephalopathy History of seizure and bipolar disorder Acute renal failure secondary to poor renal perfusion Anion gap metabolic acidosis Lactic acidosis Thrombocytopenia Transient A. fib with RVR Diastolic congestive heart failure Poorly controlled diabetes mellitus Urinary retention secondary to phimosis  Plan:   Acute hypoxic respiratory failure suspected to be secondary to aspiration pneumonia:   Septic shock suspected secondary to aspiration pneumonia and candidemia:   BCID with candida albicans and candida tropicalis and resp culture with Staph auerus and Klebsiella sp. on antibiotic and antifungal coverage. Continues to require full ventilator support and maximum vasopressor support.   -PRVC 35/470/8/40%/MV 16 -continue ventilator support with lung protective strategies  -Mentation currently precludes extubation  -Wean PEEP and FiO2 for sats greater than 90%. -Head of bed elevated 30 degrees. -Plateau pressures less than 30 cm H20.  -Follow intermittent chest x-ray and ABG.   -Ensure adequate pulmonary hygiene  -follow cultures  -VAP bundle in place  -PAD protocol -Continue Unasyn and anidulafungin per ID. (Day 6) -Continue pressor support for MAP goal >65 His sister is considering comfort care.  Will continue conversation regarding comfort care today.   Acute metabolic encephalopathy: Secondary to hypoxia and sepsis; currently minimally responsive without any sedation.  Hx of seizure activity and bipolar disorder Concern for seizure activity am of 8/16, per chart review patient was seen with questionable seizure and started on Keppra on admission  -EEG is negative for seizure -Continue to minimize sedation -Supportive care -Seizure precautions -Continue Keppra 500  mg twice daily   Acute renal failure: Baseline creatinine of 2.8.  In setting of ongoing septic shock. Minimal urine output.  Patient only make 400 cc of urine.  Currently +33 L -Remains on pressor support to ensure renal perfusion -Follow renal function / urine output -Trend Bmet -Avoid nephrotoxins, ensure adequate renal perfusion  -Enteral hydration    Anion gap metabolic acidosis Lactic acidosis:  In setting of septic shock and ongoing hypoperfusion. Was on bicarb gtt which was discontinued 8/14 for pH 7.499.  Will recheck ABG. Lactic acid remains above 11 since 8/13.  Continue to trend lactic  acid -Continue support measures as above -Continue GOC discussion    Thrombocytopenia Anemia of acute illness  In setting of septic shock; received 1u platelets yesterday.  Platelets still low at 10.  Will consider infuse platelets.  Hemoglobin stable at 15 -Continue to trend CBC -Watch for active bleeding  -Transfuse Hgb >7, Plt > 10   Hx of transient atrial fibrillation with RVR Diastolic congestive heart failure  Currently normal sinus rhythm and hypovolemic in setting of shock -Continue Amio drip.  Consider p.o. amiodarone -Continuous tele -Not indicated for anticoagulation given thrombocytopenia   Hx of poorly controlled diabetes mellitus  Hypoglycemia  HbA1c 11.1.  CBG within good range -Continue CBG monitoring  -Continue tube feed -SSI as needed   Urinary retention secondary to phimosis Foley in place, minimal urine output  -Continue foley   GOC: No further escalation of care and pt DNR per conversation with family on 8/13. Further decisions for possible comfort care pending patient status on Monday 8/16.  Continue conversation with family regarding decision of make him comfort today 8/17. Prognosis is poor  Best practice:  Diet: tube feeds Pain/Anxiety/Delirium protocol (if indicated): per protocol VAP protocol (if indicated): per protocol DVT prophylaxis: SCDs GI prophylaxis: Protonix Glucose control: SSI  Mobility: Bed rest Code Status: FULL  Family Communication: Continue discussion for goals of care Disposition: ICU   CRITICAL CARE Performed by: Gaylan Gerold  Total critical care time: 25 minutes  Gaylan Gerold, DO Internal Medicine Residency My pager: [phone]

## 2019-09-24 NOTE — Progress Notes (Signed)
Nutrition Brief Note  Chart reviewed. Discussed patient in ICU rounds today. Patient now transitioning to comfort care.  No further nutrition interventions warranted at this time.  Please re-consult as needed.   Lucas Mallow, RD, LDN, CNSC Please refer to [facility] for contact information.

## 2019-09-24 DEATH — deceased
